# Patient Record
Sex: Female | Born: 1935
Health system: Southern US, Community
[De-identification: ages and names within clinical notes are randomized; demographics above are authoritative.]

## PROBLEM LIST (undated history)

## (undated) DIAGNOSIS — M8580 Other specified disorders of bone density and structure, unspecified site: Secondary | ICD-10-CM

## (undated) DIAGNOSIS — N390 Urinary tract infection, site not specified: Secondary | ICD-10-CM

## (undated) DIAGNOSIS — Z992 Dependence on renal dialysis: Secondary | ICD-10-CM

## (undated) DIAGNOSIS — I739 Peripheral vascular disease, unspecified: Secondary | ICD-10-CM

## (undated) DIAGNOSIS — N186 End stage renal disease: Secondary | ICD-10-CM

## (undated) DIAGNOSIS — I272 Pulmonary hypertension, unspecified: Secondary | ICD-10-CM

## (undated) DIAGNOSIS — K5900 Constipation, unspecified: Secondary | ICD-10-CM

## (undated) DIAGNOSIS — Z860100 Personal history of colon polyps, unspecified: Secondary | ICD-10-CM

## (undated) DIAGNOSIS — G43909 Migraine, unspecified, not intractable, without status migrainosus: Secondary | ICD-10-CM

## (undated) DIAGNOSIS — I2510 Atherosclerotic heart disease of native coronary artery without angina pectoris: Secondary | ICD-10-CM

## (undated) DIAGNOSIS — D649 Anemia, unspecified: Secondary | ICD-10-CM

## (undated) DIAGNOSIS — R197 Diarrhea, unspecified: Secondary | ICD-10-CM

## (undated) DIAGNOSIS — M1990 Unspecified osteoarthritis, unspecified site: Secondary | ICD-10-CM

## (undated) DIAGNOSIS — I10 Essential (primary) hypertension: Secondary | ICD-10-CM

## (undated) DIAGNOSIS — C801 Malignant (primary) neoplasm, unspecified: Secondary | ICD-10-CM

## (undated) DIAGNOSIS — E785 Hyperlipidemia, unspecified: Secondary | ICD-10-CM

## (undated) DIAGNOSIS — E039 Hypothyroidism, unspecified: Secondary | ICD-10-CM

## (undated) DIAGNOSIS — R34 Anuria and oliguria: Secondary | ICD-10-CM

## (undated) DIAGNOSIS — Z9289 Personal history of other medical treatment: Secondary | ICD-10-CM

## (undated) DIAGNOSIS — K219 Gastro-esophageal reflux disease without esophagitis: Secondary | ICD-10-CM

## (undated) DIAGNOSIS — I5032 Chronic diastolic (congestive) heart failure: Secondary | ICD-10-CM

## (undated) DIAGNOSIS — I350 Nonrheumatic aortic (valve) stenosis: Secondary | ICD-10-CM

## (undated) DIAGNOSIS — F419 Anxiety disorder, unspecified: Secondary | ICD-10-CM

## (undated) HISTORY — PX: APPENDECTOMY: SHX54

## (undated) HISTORY — DX: Chronic diastolic (congestive) heart failure: I50.32

## (undated) HISTORY — DX: End stage renal disease: N18.6

## (undated) HISTORY — PX: CORONARY ARTERY BYPASS GRAFT: SHX141

## (undated) HISTORY — PX: EYE SURGERY: SHX253

## (undated) HISTORY — PX: ABDOMINAL HYSTERECTOMY: SHX81

## (undated) HISTORY — DX: Essential (primary) hypertension: I10

## (undated) HISTORY — DX: Urinary tract infection, site not specified: N39.0

## (undated) HISTORY — DX: Atherosclerotic heart disease of native coronary artery without angina pectoris: I25.10

## (undated) HISTORY — DX: Personal history of colonic polyps: Z86.010

## (undated) HISTORY — DX: Hypothyroidism, unspecified: E03.9

## (undated) HISTORY — PX: CATARACT EXTRACTION: SUR2

## (undated) HISTORY — PX: OTHER SURGICAL HISTORY: SHX169

## (undated) HISTORY — DX: Gastro-esophageal reflux disease without esophagitis: K21.9

## (undated) HISTORY — PX: TONSILLECTOMY: SUR1361

## (undated) HISTORY — DX: Pulmonary hypertension, unspecified: I27.20

## (undated) HISTORY — DX: Other specified disorders of bone density and structure, unspecified site: M85.80

## (undated) HISTORY — DX: Personal history of colon polyps, unspecified: Z86.0100

## (undated) HISTORY — DX: Hyperlipidemia, unspecified: E78.5

## (undated) HISTORY — DX: Anemia, unspecified: D64.9

## (undated) HISTORY — DX: Nonrheumatic aortic (valve) stenosis: I35.0

## (undated) HISTORY — DX: Unspecified osteoarthritis, unspecified site: M19.90

## (undated) HISTORY — DX: Anxiety disorder, unspecified: F41.9

---

## 1998-01-05 HISTORY — PX: CARDIAC CATHETERIZATION: SHX172

## 1998-05-06 HISTORY — PX: REPLACEMENT TOTAL KNEE BILATERAL: SUR1225

## 2001-06-05 ENCOUNTER — Encounter: Payer: Self-pay | Admitting: Internal Medicine

## 2001-06-05 LAB — CONVERTED CEMR LAB: Pap Smear: NORMAL

## 2003-09-25 ENCOUNTER — Encounter: Payer: Self-pay | Admitting: Internal Medicine

## 2003-09-25 ENCOUNTER — Ambulatory Visit (HOSPITAL_COMMUNITY): Admission: RE | Admit: 2003-09-25 | Discharge: 2003-09-25 | Payer: Self-pay | Admitting: Cardiology

## 2003-10-10 ENCOUNTER — Ambulatory Visit: Payer: Self-pay | Admitting: Internal Medicine

## 2003-10-19 ENCOUNTER — Encounter: Admission: RE | Admit: 2003-10-19 | Discharge: 2003-10-19 | Payer: Self-pay | Admitting: Pediatrics

## 2003-11-06 ENCOUNTER — Ambulatory Visit: Payer: Self-pay | Admitting: Internal Medicine

## 2003-12-06 ENCOUNTER — Ambulatory Visit: Payer: Self-pay | Admitting: Internal Medicine

## 2003-12-21 ENCOUNTER — Ambulatory Visit: Payer: Self-pay | Admitting: Internal Medicine

## 2004-02-05 ENCOUNTER — Ambulatory Visit: Payer: Self-pay

## 2004-04-08 ENCOUNTER — Ambulatory Visit: Payer: Self-pay | Admitting: Internal Medicine

## 2004-04-15 ENCOUNTER — Ambulatory Visit: Payer: Self-pay | Admitting: Internal Medicine

## 2004-08-11 ENCOUNTER — Ambulatory Visit: Payer: Self-pay | Admitting: Internal Medicine

## 2004-08-12 ENCOUNTER — Ambulatory Visit: Payer: Self-pay

## 2004-11-19 ENCOUNTER — Ambulatory Visit: Payer: Self-pay | Admitting: Cardiology

## 2004-12-04 ENCOUNTER — Encounter: Payer: Self-pay | Admitting: Cardiology

## 2004-12-04 ENCOUNTER — Ambulatory Visit: Payer: Self-pay

## 2004-12-11 ENCOUNTER — Ambulatory Visit: Payer: Self-pay | Admitting: Internal Medicine

## 2005-01-05 HISTORY — PX: OTHER SURGICAL HISTORY: SHX169

## 2005-04-29 ENCOUNTER — Ambulatory Visit: Payer: Self-pay | Admitting: Internal Medicine

## 2005-06-29 ENCOUNTER — Ambulatory Visit: Payer: Self-pay | Admitting: Internal Medicine

## 2005-07-01 ENCOUNTER — Ambulatory Visit: Payer: Self-pay | Admitting: Internal Medicine

## 2005-07-27 ENCOUNTER — Ambulatory Visit: Payer: Self-pay

## 2005-08-05 ENCOUNTER — Ambulatory Visit: Payer: Self-pay | Admitting: Cardiology

## 2005-08-11 ENCOUNTER — Ambulatory Visit: Payer: Self-pay

## 2005-10-30 ENCOUNTER — Ambulatory Visit: Payer: Self-pay | Admitting: Internal Medicine

## 2006-01-13 ENCOUNTER — Ambulatory Visit: Payer: Self-pay | Admitting: Internal Medicine

## 2006-01-14 ENCOUNTER — Encounter: Payer: Self-pay | Admitting: Internal Medicine

## 2006-01-14 LAB — CONVERTED CEMR LAB: Glucose, Bld: 137 mg/dL — ABNORMAL HIGH (ref 70–99)

## 2006-02-01 ENCOUNTER — Ambulatory Visit: Payer: Self-pay | Admitting: Internal Medicine

## 2006-02-04 ENCOUNTER — Ambulatory Visit: Payer: Self-pay

## 2006-04-08 ENCOUNTER — Ambulatory Visit: Payer: Self-pay | Admitting: Internal Medicine

## 2006-04-08 LAB — CONVERTED CEMR LAB
ALT: 19 units/L (ref 0–40)
Cholesterol: 164 mg/dL (ref 0–200)
HDL: 58.7 mg/dL (ref >39.0)
LDL Cholesterol: 71 mg/dL (ref 0–99)
Total CHOL/HDL Ratio: 2.8
Triglycerides: 174 mg/dL — ABNORMAL HIGH (ref 0–149)
VLDL: 35 mg/dL (ref 0–40)

## 2006-05-27 ENCOUNTER — Encounter: Payer: Self-pay | Admitting: Internal Medicine

## 2006-05-27 DIAGNOSIS — M899 Disorder of bone, unspecified: Secondary | ICD-10-CM | POA: Insufficient documentation

## 2006-05-27 DIAGNOSIS — K219 Gastro-esophageal reflux disease without esophagitis: Secondary | ICD-10-CM

## 2006-05-27 DIAGNOSIS — R32 Unspecified urinary incontinence: Secondary | ICD-10-CM

## 2006-05-27 DIAGNOSIS — I10 Essential (primary) hypertension: Secondary | ICD-10-CM | POA: Insufficient documentation

## 2006-05-27 DIAGNOSIS — M1990 Unspecified osteoarthritis, unspecified site: Secondary | ICD-10-CM | POA: Insufficient documentation

## 2006-05-27 DIAGNOSIS — M949 Disorder of cartilage, unspecified: Secondary | ICD-10-CM

## 2006-05-27 DIAGNOSIS — E1129 Type 2 diabetes mellitus with other diabetic kidney complication: Secondary | ICD-10-CM

## 2006-05-27 DIAGNOSIS — E785 Hyperlipidemia, unspecified: Secondary | ICD-10-CM

## 2006-05-27 DIAGNOSIS — E039 Hypothyroidism, unspecified: Secondary | ICD-10-CM

## 2006-05-30 DIAGNOSIS — F411 Generalized anxiety disorder: Secondary | ICD-10-CM | POA: Insufficient documentation

## 2006-05-30 DIAGNOSIS — I272 Pulmonary hypertension, unspecified: Secondary | ICD-10-CM | POA: Insufficient documentation

## 2006-06-02 ENCOUNTER — Ambulatory Visit: Payer: Self-pay | Admitting: Internal Medicine

## 2006-06-04 LAB — CONVERTED CEMR LAB
ALT: 20 units/L (ref 0–40)
Albumin: 3.3 g/dL — ABNORMAL LOW (ref 3.5–5.2)
BUN: 41 mg/dL — ABNORMAL HIGH (ref 6–23)
CO2: 27 meq/L (ref 19–32)
Calcium: 9.4 mg/dL (ref 8.4–10.5)
Chloride: 108 meq/L (ref 96–112)
Cholesterol: 165 mg/dL (ref 0–200)
Creatinine, Ser: 1.6 mg/dL — ABNORMAL HIGH (ref 0.4–1.2)
GFR calc Af Amer: 39 mL/min
GFR calc non Af Amer: 33 mL/min
Glucose, Bld: 175 mg/dL — ABNORMAL HIGH (ref 70–99)
HDL: 49.5 mg/dL (ref >39.0)
Hgb A1c MFr Bld: 8.7 % — ABNORMAL HIGH (ref 4.6–6.0)
LDL Cholesterol: 92 mg/dL (ref 0–99)
Phosphorus: 3.4 mg/dL (ref 2.3–4.6)
Potassium: 4.7 meq/L (ref 3.5–5.1)
Sodium: 141 meq/L (ref 135–145)
Total CHOL/HDL Ratio: 3.3
Triglycerides: 120 mg/dL (ref 0–149)
VLDL: 24 mg/dL (ref 0–40)

## 2006-06-29 ENCOUNTER — Ambulatory Visit: Payer: Self-pay

## 2006-07-30 ENCOUNTER — Ambulatory Visit: Payer: Self-pay | Admitting: Cardiology

## 2006-08-10 ENCOUNTER — Ambulatory Visit: Payer: Self-pay | Admitting: Cardiology

## 2006-08-10 ENCOUNTER — Encounter: Payer: Self-pay | Admitting: Cardiology

## 2006-08-25 ENCOUNTER — Ambulatory Visit: Payer: Self-pay | Admitting: Cardiology

## 2006-09-26 ENCOUNTER — Encounter: Payer: Self-pay | Admitting: Internal Medicine

## 2006-10-04 ENCOUNTER — Ambulatory Visit: Payer: Self-pay | Admitting: Internal Medicine

## 2006-10-04 DIAGNOSIS — N259 Disorder resulting from impaired renal tubular function, unspecified: Secondary | ICD-10-CM | POA: Insufficient documentation

## 2006-10-04 DIAGNOSIS — I359 Nonrheumatic aortic valve disorder, unspecified: Secondary | ICD-10-CM | POA: Insufficient documentation

## 2006-10-04 LAB — CONVERTED CEMR LAB
Albumin: 3.5 g/dL (ref 3.5–5.2)
BUN: 41 mg/dL — ABNORMAL HIGH (ref 6–23)
CO2: 27 meq/L (ref 19–32)
Calcium: 9.5 mg/dL (ref 8.4–10.5)
Chloride: 106 meq/L (ref 96–112)
Creatinine, Ser: 1.5 mg/dL — ABNORMAL HIGH (ref 0.4–1.2)
GFR calc Af Amer: 43 mL/min
GFR calc non Af Amer: 35 mL/min
Glucose, Bld: 123 mg/dL — ABNORMAL HIGH (ref 70–99)
Hgb A1c MFr Bld: 6.9 % — ABNORMAL HIGH (ref 4.6–6.0)
Phosphorus: 3.4 mg/dL (ref 2.3–4.6)
Potassium: 4.9 meq/L (ref 3.5–5.1)
Sodium: 142 meq/L (ref 135–145)

## 2006-11-12 ENCOUNTER — Ambulatory Visit: Payer: Self-pay | Admitting: Internal Medicine

## 2006-11-15 ENCOUNTER — Encounter: Payer: Self-pay | Admitting: Internal Medicine

## 2006-11-24 ENCOUNTER — Ambulatory Visit: Payer: Self-pay | Admitting: Cardiology

## 2006-12-16 ENCOUNTER — Encounter: Payer: Self-pay | Admitting: Internal Medicine

## 2006-12-21 ENCOUNTER — Ambulatory Visit: Payer: Self-pay

## 2007-01-06 HISTORY — PX: AORTIC VALVE REPLACEMENT: SHX41

## 2007-01-06 HISTORY — PX: CORONARY ARTERY BYPASS GRAFT: SHX141

## 2007-01-07 ENCOUNTER — Ambulatory Visit: Payer: Self-pay | Admitting: Cardiology

## 2007-01-07 LAB — CONVERTED CEMR LAB
ALT: 14 units/L (ref 0–35)
AST: 22 units/L (ref 0–37)
Albumin: 4.1 g/dL (ref 3.5–5.2)
Alkaline Phosphatase: 79 units/L (ref 39–117)
BUN: 30 mg/dL — ABNORMAL HIGH (ref 6–23)
Basophils Absolute: 0 10*3/uL (ref 0.0–0.1)
Basophils Relative: 0 % (ref 0–1)
CO2: 24 meq/L (ref 19–32)
Calcium: 9.8 mg/dL (ref 8.4–10.5)
Chloride: 103 meq/L (ref 96–112)
Creatinine, Ser: 1.38 mg/dL — ABNORMAL HIGH (ref 0.40–1.20)
Eosinophils Absolute: 0.2 10*3/uL (ref 0.0–0.7)
Eosinophils Relative: 3 % (ref 0–5)
Glucose, Bld: 118 mg/dL — ABNORMAL HIGH (ref 70–99)
HCT: 35.9 % — ABNORMAL LOW (ref 36.0–46.0)
Hemoglobin: 12 g/dL (ref 12.0–15.0)
INR: 1 (ref 0.0–1.5)
Lymphocytes Relative: 17 % (ref 12–46)
Lymphs Abs: 1.2 10*3/uL (ref 0.7–4.0)
MCHC: 33.4 g/dL (ref 30.0–36.0)
MCV: 98.4 fL (ref 78.0–100.0)
Monocytes Absolute: 0.7 10*3/uL (ref 0.1–1.0)
Monocytes Relative: 9 % (ref 3–12)
Neutro Abs: 5.1 10*3/uL (ref 1.7–7.7)
Neutrophils Relative %: 71 % (ref 43–77)
Platelets: 250 10*3/uL (ref 150–400)
Potassium: 5 meq/L (ref 3.5–5.3)
Prothrombin Time: 13 s (ref 11.6–15.2)
RBC: 3.65 M/uL — ABNORMAL LOW (ref 3.87–5.11)
RDW: 13 % (ref 11.5–15.5)
Sodium: 141 meq/L (ref 135–145)
Total Bilirubin: 1.2 mg/dL (ref 0.3–1.2)
Total Protein: 7.6 g/dL (ref 6.0–8.3)
WBC: 7.2 10*3/uL (ref 4.0–10.5)
aPTT: 30 s (ref 24–37)

## 2007-01-12 ENCOUNTER — Ambulatory Visit: Payer: Self-pay | Admitting: Internal Medicine

## 2007-01-12 ENCOUNTER — Inpatient Hospital Stay (HOSPITAL_BASED_OUTPATIENT_CLINIC_OR_DEPARTMENT_OTHER): Admission: RE | Admit: 2007-01-12 | Discharge: 2007-01-12 | Payer: Self-pay | Admitting: Internal Medicine

## 2007-01-17 ENCOUNTER — Ambulatory Visit: Payer: Self-pay | Admitting: Thoracic Surgery (Cardiothoracic Vascular Surgery)

## 2007-01-17 ENCOUNTER — Encounter
Admission: RE | Admit: 2007-01-17 | Discharge: 2007-01-17 | Payer: Self-pay | Admitting: Thoracic Surgery (Cardiothoracic Vascular Surgery)

## 2007-01-24 ENCOUNTER — Encounter: Payer: Self-pay | Admitting: Thoracic Surgery (Cardiothoracic Vascular Surgery)

## 2007-01-24 ENCOUNTER — Ambulatory Visit (HOSPITAL_COMMUNITY)
Admission: RE | Admit: 2007-01-24 | Discharge: 2007-01-24 | Payer: Self-pay | Admitting: Thoracic Surgery (Cardiothoracic Vascular Surgery)

## 2007-01-25 ENCOUNTER — Encounter: Payer: Self-pay | Admitting: Internal Medicine

## 2007-01-26 ENCOUNTER — Inpatient Hospital Stay (HOSPITAL_COMMUNITY)
Admission: RE | Admit: 2007-01-26 | Discharge: 2007-02-02 | Payer: Self-pay | Admitting: Thoracic Surgery (Cardiothoracic Vascular Surgery)

## 2007-01-26 ENCOUNTER — Encounter: Payer: Self-pay | Admitting: Thoracic Surgery (Cardiothoracic Vascular Surgery)

## 2007-01-26 ENCOUNTER — Ambulatory Visit: Payer: Self-pay | Admitting: Thoracic Surgery (Cardiothoracic Vascular Surgery)

## 2007-02-02 ENCOUNTER — Encounter: Payer: Self-pay | Admitting: Internal Medicine

## 2007-02-04 ENCOUNTER — Emergency Department (HOSPITAL_COMMUNITY): Admission: EM | Admit: 2007-02-04 | Discharge: 2007-02-04 | Payer: Self-pay | Admitting: Emergency Medicine

## 2007-02-06 ENCOUNTER — Encounter: Payer: Self-pay | Admitting: Internal Medicine

## 2007-02-07 ENCOUNTER — Ambulatory Visit: Payer: Self-pay | Admitting: Thoracic Surgery (Cardiothoracic Vascular Surgery)

## 2007-02-07 ENCOUNTER — Ambulatory Visit: Payer: Self-pay | Admitting: Cardiology

## 2007-02-11 ENCOUNTER — Ambulatory Visit: Payer: Self-pay | Admitting: Internal Medicine

## 2007-02-11 DIAGNOSIS — K1120 Sialoadenitis, unspecified: Secondary | ICD-10-CM | POA: Insufficient documentation

## 2007-02-11 DIAGNOSIS — I2510 Atherosclerotic heart disease of native coronary artery without angina pectoris: Secondary | ICD-10-CM

## 2007-02-14 ENCOUNTER — Telehealth: Payer: Self-pay | Admitting: Internal Medicine

## 2007-02-21 ENCOUNTER — Ambulatory Visit: Payer: Self-pay | Admitting: Thoracic Surgery (Cardiothoracic Vascular Surgery)

## 2007-02-21 ENCOUNTER — Encounter
Admission: RE | Admit: 2007-02-21 | Discharge: 2007-02-21 | Payer: Self-pay | Admitting: Thoracic Surgery (Cardiothoracic Vascular Surgery)

## 2007-02-24 ENCOUNTER — Ambulatory Visit: Payer: Self-pay | Admitting: Cardiology

## 2007-03-01 ENCOUNTER — Ambulatory Visit: Payer: Self-pay | Admitting: Internal Medicine

## 2007-03-01 DIAGNOSIS — L03119 Cellulitis of unspecified part of limb: Secondary | ICD-10-CM

## 2007-03-01 DIAGNOSIS — L02419 Cutaneous abscess of limb, unspecified: Secondary | ICD-10-CM | POA: Insufficient documentation

## 2007-03-21 ENCOUNTER — Encounter: Payer: Self-pay | Admitting: Internal Medicine

## 2007-03-23 ENCOUNTER — Encounter: Payer: Self-pay | Admitting: Cardiology

## 2007-03-28 ENCOUNTER — Encounter: Payer: Self-pay | Admitting: Internal Medicine

## 2007-03-29 ENCOUNTER — Ambulatory Visit: Payer: Self-pay | Admitting: Cardiology

## 2007-04-05 ENCOUNTER — Telehealth: Payer: Self-pay | Admitting: Internal Medicine

## 2007-04-06 ENCOUNTER — Encounter: Payer: Self-pay | Admitting: Cardiology

## 2007-04-18 ENCOUNTER — Ambulatory Visit: Payer: Self-pay | Admitting: Cardiology

## 2007-05-05 ENCOUNTER — Ambulatory Visit: Payer: Self-pay | Admitting: Internal Medicine

## 2007-05-06 ENCOUNTER — Encounter: Payer: Self-pay | Admitting: Cardiology

## 2007-05-06 LAB — CONVERTED CEMR LAB
ALT: 18 units/L (ref 0–35)
AST: 20 units/L (ref 0–37)
Albumin: 3.5 g/dL (ref 3.5–5.2)
Alkaline Phosphatase: 87 units/L (ref 39–117)
BUN: 32 mg/dL — ABNORMAL HIGH (ref 6–23)
Basophils Absolute: 0.1 10*3/uL (ref 0.0–0.1)
Basophils Relative: 0.9 % (ref 0.0–1.0)
Bilirubin, Direct: 0.1 mg/dL (ref 0.0–0.3)
CO2: 30 meq/L (ref 19–32)
Calcium: 9.2 mg/dL (ref 8.4–10.5)
Chloride: 102 meq/L (ref 96–112)
Cholesterol: 166 mg/dL (ref 0–200)
Creatinine, Ser: 1.3 mg/dL — ABNORMAL HIGH (ref 0.4–1.2)
Eosinophils Absolute: 0.3 10*3/uL (ref 0.0–0.7)
Eosinophils Relative: 4.2 % (ref 0.0–5.0)
GFR calc Af Amer: 50 mL/min
GFR calc non Af Amer: 41 mL/min
Glucose, Bld: 133 mg/dL — ABNORMAL HIGH (ref 70–99)
HCT: 34.4 % — ABNORMAL LOW (ref 36.0–46.0)
HDL: 55.8 mg/dL (ref >39.0)
Hemoglobin: 11.7 g/dL — ABNORMAL LOW (ref 12.0–15.0)
LDL Cholesterol: 77 mg/dL (ref 0–99)
Lymphocytes Relative: 20.9 % (ref 12.0–46.0)
MCHC: 34 g/dL (ref 30.0–36.0)
MCV: 95.6 fL (ref 78.0–100.0)
Monocytes Absolute: 0.8 10*3/uL (ref 0.1–1.0)
Monocytes Relative: 10.4 % (ref 3.0–12.0)
Neutro Abs: 4.9 10*3/uL (ref 1.4–7.7)
Neutrophils Relative %: 63.6 % (ref 43.0–77.0)
Phosphorus: 3.9 mg/dL (ref 2.3–4.6)
Platelets: 184 10*3/uL (ref 150–400)
Potassium: 4.7 meq/L (ref 3.5–5.1)
RBC: 3.6 M/uL — ABNORMAL LOW (ref 3.87–5.11)
RDW: 12.2 % (ref 11.5–14.6)
Sodium: 135 meq/L (ref 135–145)
TSH: 0.1 microintl units/mL — ABNORMAL LOW (ref 0.35–5.50)
Total Bilirubin: 1.2 mg/dL (ref 0.3–1.2)
Total CHOL/HDL Ratio: 3
Total Protein: 7.2 g/dL (ref 6.0–8.3)
Triglycerides: 164 mg/dL — ABNORMAL HIGH (ref 0–149)
VLDL: 33 mg/dL (ref 0–40)
WBC: 7.7 10*3/uL (ref 4.5–10.5)

## 2007-05-20 ENCOUNTER — Ambulatory Visit: Payer: Self-pay | Admitting: Internal Medicine

## 2007-05-23 ENCOUNTER — Encounter: Payer: Self-pay | Admitting: Internal Medicine

## 2007-05-23 LAB — CONVERTED CEMR LAB
Free T4: 1 ng/dL (ref 0.6–1.6)
TSH: 0.25 microintl units/mL — ABNORMAL LOW (ref 0.35–5.50)

## 2007-06-06 ENCOUNTER — Telehealth: Payer: Self-pay | Admitting: Internal Medicine

## 2007-06-06 ENCOUNTER — Encounter: Payer: Self-pay | Admitting: Cardiology

## 2007-06-07 ENCOUNTER — Telehealth (INDEPENDENT_AMBULATORY_CARE_PROVIDER_SITE_OTHER): Payer: Self-pay | Admitting: *Deleted

## 2007-06-08 ENCOUNTER — Ambulatory Visit: Payer: Self-pay

## 2007-06-24 ENCOUNTER — Encounter: Payer: Self-pay | Admitting: Family Medicine

## 2007-07-04 ENCOUNTER — Encounter: Payer: Self-pay | Admitting: Internal Medicine

## 2007-08-15 ENCOUNTER — Ambulatory Visit: Payer: Self-pay | Admitting: Cardiology

## 2007-08-30 ENCOUNTER — Telehealth: Payer: Self-pay | Admitting: Internal Medicine

## 2007-11-08 ENCOUNTER — Ambulatory Visit: Payer: Self-pay | Admitting: Internal Medicine

## 2007-11-09 LAB — CONVERTED CEMR LAB
ALT: 18 units/L (ref 0–35)
AST: 22 units/L (ref 0–37)
Albumin: 3.3 g/dL — ABNORMAL LOW (ref 3.5–5.2)
Alkaline Phosphatase: 75 units/L (ref 39–117)
BUN: 35 mg/dL — ABNORMAL HIGH (ref 6–23)
Basophils Absolute: 0 10*3/uL (ref 0.0–0.1)
Basophils Relative: 0.8 % (ref 0.0–3.0)
Bilirubin, Direct: 0.1 mg/dL (ref 0.0–0.3)
CO2: 27 meq/L (ref 19–32)
Calcium: 9.2 mg/dL (ref 8.4–10.5)
Chloride: 103 meq/L (ref 96–112)
Cholesterol: 158 mg/dL (ref 0–200)
Creatinine, Ser: 1.3 mg/dL — ABNORMAL HIGH (ref 0.4–1.2)
Eosinophils Absolute: 0.2 10*3/uL (ref 0.0–0.7)
Eosinophils Relative: 3.9 % (ref 0.0–5.0)
GFR calc Af Amer: 50 mL/min
GFR calc non Af Amer: 41 mL/min
Glucose, Bld: 128 mg/dL — ABNORMAL HIGH (ref 70–99)
HCT: 30.9 % — ABNORMAL LOW (ref 36.0–46.0)
HDL: 58.5 mg/dL (ref >39.0)
Hemoglobin: 10.8 g/dL — ABNORMAL LOW (ref 12.0–15.0)
Hgb A1c MFr Bld: 6.8 % — ABNORMAL HIGH (ref 4.6–6.0)
LDL Cholesterol: 82 mg/dL (ref 0–99)
Lymphocytes Relative: 20.3 % (ref 12.0–46.0)
MCHC: 34.9 g/dL (ref 30.0–36.0)
MCV: 95.4 fL (ref 78.0–100.0)
Monocytes Absolute: 0.6 10*3/uL (ref 0.1–1.0)
Monocytes Relative: 10.3 % (ref 3.0–12.0)
Neutro Abs: 3.8 10*3/uL (ref 1.4–7.7)
Neutrophils Relative %: 64.7 % (ref 43.0–77.0)
Phosphorus: 3.9 mg/dL (ref 2.3–4.6)
Platelets: 197 10*3/uL (ref 150–400)
Potassium: 5.1 meq/L (ref 3.5–5.1)
RBC: 3.23 M/uL — ABNORMAL LOW (ref 3.87–5.11)
RDW: 11.7 % (ref 11.5–14.6)
Sodium: 138 meq/L (ref 135–145)
Total Bilirubin: 1.1 mg/dL (ref 0.3–1.2)
Total CHOL/HDL Ratio: 2.7
Total Protein: 6.9 g/dL (ref 6.0–8.3)
Triglycerides: 89 mg/dL (ref 0–149)
VLDL: 18 mg/dL (ref 0–40)
WBC: 5.8 10*3/uL (ref 4.5–10.5)

## 2007-11-14 ENCOUNTER — Encounter: Payer: Self-pay | Admitting: Internal Medicine

## 2007-11-18 ENCOUNTER — Encounter: Payer: Self-pay | Admitting: Internal Medicine

## 2007-12-14 ENCOUNTER — Ambulatory Visit: Payer: Self-pay

## 2008-01-17 ENCOUNTER — Encounter: Payer: Self-pay | Admitting: Internal Medicine

## 2008-01-17 ENCOUNTER — Ambulatory Visit: Payer: Self-pay | Admitting: Cardiology

## 2008-01-17 LAB — CONVERTED CEMR LAB
Folate: >20 ng/mL
Vitamin B-12: 428 pg/mL (ref 211–911)

## 2008-02-21 ENCOUNTER — Telehealth: Payer: Self-pay | Admitting: Internal Medicine

## 2008-02-28 ENCOUNTER — Telehealth: Payer: Self-pay | Admitting: Internal Medicine

## 2008-03-04 ENCOUNTER — Emergency Department (HOSPITAL_COMMUNITY): Admission: EM | Admit: 2008-03-04 | Discharge: 2008-03-04 | Payer: Self-pay | Admitting: Emergency Medicine

## 2008-03-23 ENCOUNTER — Ambulatory Visit: Payer: Self-pay | Admitting: Internal Medicine

## 2008-03-23 DIAGNOSIS — R002 Palpitations: Secondary | ICD-10-CM

## 2008-03-26 LAB — CONVERTED CEMR LAB
Albumin: 3.7 g/dL (ref 3.5–5.2)
BUN: 36 mg/dL — ABNORMAL HIGH (ref 6–23)
CO2: 19 meq/L (ref 19–32)
Calcium: 9 mg/dL (ref 8.4–10.5)
Chloride: 104 meq/L (ref 96–112)
Creatinine, Ser: 1.74 mg/dL — ABNORMAL HIGH (ref 0.40–1.20)
Glucose, Bld: 106 mg/dL — ABNORMAL HIGH (ref 70–99)
Phosphorus: 3.4 mg/dL (ref 2.3–4.6)
Potassium: 4.4 meq/L (ref 3.5–5.3)
Sodium: 137 meq/L (ref 135–145)

## 2008-05-21 ENCOUNTER — Encounter: Payer: Self-pay | Admitting: Internal Medicine

## 2008-06-28 ENCOUNTER — Ambulatory Visit: Payer: Self-pay

## 2008-07-02 ENCOUNTER — Encounter: Payer: Self-pay | Admitting: Cardiology

## 2008-07-17 ENCOUNTER — Ambulatory Visit: Payer: Self-pay | Admitting: Cardiology

## 2008-07-17 ENCOUNTER — Encounter: Payer: Self-pay | Admitting: Cardiology

## 2008-07-23 ENCOUNTER — Ambulatory Visit: Payer: Self-pay | Admitting: Internal Medicine

## 2008-07-31 ENCOUNTER — Encounter: Payer: Self-pay | Admitting: Internal Medicine

## 2008-07-31 LAB — CONVERTED CEMR LAB
ALT: 21 units/L (ref 0–35)
AST: 25 units/L (ref 0–37)
Albumin: 3.4 g/dL — ABNORMAL LOW (ref 3.5–5.2)
Alkaline Phosphatase: 73 units/L (ref 39–117)
BUN: 36 mg/dL — ABNORMAL HIGH (ref 6–23)
Basophils Absolute: 0 10*3/uL (ref 0.0–0.1)
Basophils Relative: 0.1 % (ref 0.0–3.0)
Bilirubin, Direct: 0.2 mg/dL (ref 0.0–0.3)
CO2: 30 meq/L (ref 19–32)
Calcium: 9 mg/dL (ref 8.4–10.5)
Chloride: 105 meq/L (ref 96–112)
Cholesterol: 141 mg/dL (ref 0–200)
Creatinine, Ser: 1.4 mg/dL — ABNORMAL HIGH (ref 0.4–1.2)
Eosinophils Absolute: 0.2 10*3/uL (ref 0.0–0.7)
Eosinophils Relative: 3 % (ref 0.0–5.0)
Free T4: 0.9 ng/dL (ref 0.6–1.6)
Glucose, Bld: 138 mg/dL — ABNORMAL HIGH (ref 70–99)
HCT: 30.2 % — ABNORMAL LOW (ref 36.0–46.0)
HDL: 58.4 mg/dL (ref >39.00)
Hemoglobin: 10.5 g/dL — ABNORMAL LOW (ref 12.0–15.0)
Hgb A1c MFr Bld: 6.8 % — ABNORMAL HIGH (ref 4.6–6.5)
LDL Cholesterol: 63 mg/dL (ref 0–99)
Lymphocytes Relative: 16.5 % (ref 12.0–46.0)
Lymphs Abs: 1.2 10*3/uL (ref 0.7–4.0)
MCHC: 34.7 g/dL (ref 30.0–36.0)
MCV: 97.5 fL (ref 78.0–100.0)
Monocytes Absolute: 0.7 10*3/uL (ref 0.1–1.0)
Monocytes Relative: 9.8 % (ref 3.0–12.0)
Neutro Abs: 5.1 10*3/uL (ref 1.4–7.7)
Neutrophils Relative %: 70.6 % (ref 43.0–77.0)
Phosphorus: 3.4 mg/dL (ref 2.3–4.6)
Platelets: 152 10*3/uL (ref 150.0–400.0)
Potassium: 4.9 meq/L (ref 3.5–5.1)
RBC: 3.1 M/uL — ABNORMAL LOW (ref 3.87–5.11)
RDW: 11.2 % — ABNORMAL LOW (ref 11.5–14.6)
Sodium: 141 meq/L (ref 135–145)
TSH: 0.09 microintl units/mL — ABNORMAL LOW (ref 0.35–5.50)
Total Bilirubin: 1.3 mg/dL — ABNORMAL HIGH (ref 0.3–1.2)
Total CHOL/HDL Ratio: 2
Total Protein: 7 g/dL (ref 6.0–8.3)
Triglycerides: 98 mg/dL (ref 0.0–149.0)
VLDL: 19.6 mg/dL (ref 0.0–40.0)
WBC: 7.2 10*3/uL (ref 4.5–10.5)

## 2008-08-07 ENCOUNTER — Encounter: Payer: Self-pay | Admitting: Internal Medicine

## 2008-09-14 ENCOUNTER — Telehealth: Payer: Self-pay | Admitting: Cardiology

## 2008-09-21 ENCOUNTER — Encounter: Payer: Self-pay | Admitting: Cardiology

## 2008-11-23 ENCOUNTER — Encounter: Payer: Self-pay | Admitting: Internal Medicine

## 2008-12-10 ENCOUNTER — Telehealth: Payer: Self-pay | Admitting: Cardiology

## 2008-12-17 ENCOUNTER — Encounter: Payer: Self-pay | Admitting: Internal Medicine

## 2009-01-01 ENCOUNTER — Ambulatory Visit: Payer: Self-pay

## 2009-01-01 ENCOUNTER — Encounter: Payer: Self-pay | Admitting: Cardiology

## 2009-01-01 DIAGNOSIS — I739 Peripheral vascular disease, unspecified: Secondary | ICD-10-CM

## 2009-01-01 DIAGNOSIS — I779 Disorder of arteries and arterioles, unspecified: Secondary | ICD-10-CM | POA: Insufficient documentation

## 2009-01-05 HISTORY — PX: CAROTID ENDARTERECTOMY: SUR193

## 2009-01-09 ENCOUNTER — Ambulatory Visit: Payer: Self-pay | Admitting: Internal Medicine

## 2009-01-14 LAB — CONVERTED CEMR LAB
Albumin: 3.3 g/dL — ABNORMAL LOW (ref 3.5–5.2)
BUN: 26 mg/dL — ABNORMAL HIGH (ref 6–23)
CO2: 24 meq/L (ref 19–32)
Calcium: 9.3 mg/dL (ref 8.4–10.5)
Chloride: 106 meq/L (ref 96–112)
Creatinine, Ser: 1.4 mg/dL — ABNORMAL HIGH (ref 0.4–1.2)
GFR calc non Af Amer: 37.81 mL/min (ref >60)
Glucose, Bld: 289 mg/dL — ABNORMAL HIGH (ref 70–99)
Phosphorus: 2.9 mg/dL (ref 2.3–4.6)
Potassium: 4.7 meq/L (ref 3.5–5.1)
Sodium: 139 meq/L (ref 135–145)

## 2009-01-22 ENCOUNTER — Ambulatory Visit: Payer: Self-pay | Admitting: Cardiology

## 2009-01-28 ENCOUNTER — Ambulatory Visit: Payer: Self-pay | Admitting: Internal Medicine

## 2009-01-30 LAB — CONVERTED CEMR LAB
ALT: 15 units/L (ref 0–35)
AST: 20 units/L (ref 0–37)
Albumin: 3.4 g/dL — ABNORMAL LOW (ref 3.5–5.2)
Alkaline Phosphatase: 74 units/L (ref 39–117)
BUN: 30 mg/dL — ABNORMAL HIGH (ref 6–23)
Basophils Absolute: 0 10*3/uL (ref 0.0–0.1)
Basophils Relative: 0 % (ref 0.0–3.0)
Bilirubin, Direct: 0.1 mg/dL (ref 0.0–0.3)
CO2: 27 meq/L (ref 19–32)
Calcium: 9 mg/dL (ref 8.4–10.5)
Chloride: 102 meq/L (ref 96–112)
Cholesterol: 158 mg/dL (ref 0–200)
Creatinine, Ser: 1.4 mg/dL — ABNORMAL HIGH (ref 0.4–1.2)
Eosinophils Absolute: 0.2 10*3/uL (ref 0.0–0.7)
Eosinophils Relative: 3.2 % (ref 0.0–5.0)
Free T4: 0.9 ng/dL (ref 0.6–1.6)
GFR calc non Af Amer: 37.81 mL/min (ref >60)
Glucose, Bld: 181 mg/dL — ABNORMAL HIGH (ref 70–99)
HCT: 31.4 % — ABNORMAL LOW (ref 36.0–46.0)
HDL: 61.1 mg/dL (ref >39.00)
Hemoglobin: 10.3 g/dL — ABNORMAL LOW (ref 12.0–15.0)
Hgb A1c MFr Bld: 7 % — ABNORMAL HIGH (ref 4.6–6.5)
LDL Cholesterol: 71 mg/dL (ref 0–99)
Lymphocytes Relative: 12.9 % (ref 12.0–46.0)
Lymphs Abs: 1 10*3/uL (ref 0.7–4.0)
MCHC: 32.7 g/dL (ref 30.0–36.0)
MCV: 99.6 fL (ref 78.0–100.0)
Monocytes Absolute: 0.5 10*3/uL (ref 0.1–1.0)
Monocytes Relative: 6.7 % (ref 3.0–12.0)
Neutro Abs: 6.1 10*3/uL (ref 1.4–7.7)
Neutrophils Relative %: 77.2 % — ABNORMAL HIGH (ref 43.0–77.0)
Phosphorus: 3.3 mg/dL (ref 2.3–4.6)
Platelets: 180 10*3/uL (ref 150.0–400.0)
Potassium: 4.8 meq/L (ref 3.5–5.1)
RBC: 3.15 M/uL — ABNORMAL LOW (ref 3.87–5.11)
RDW: 10.9 % — ABNORMAL LOW (ref 11.5–14.6)
Sodium: 136 meq/L (ref 135–145)
TSH: 0.24 microintl units/mL — ABNORMAL LOW (ref 0.35–5.50)
Total Bilirubin: 0.9 mg/dL (ref 0.3–1.2)
Total CHOL/HDL Ratio: 3
Total Protein: 6.8 g/dL (ref 6.0–8.3)
Triglycerides: 131 mg/dL (ref 0.0–149.0)
VLDL: 26.2 mg/dL (ref 0.0–40.0)
WBC: 7.8 10*3/uL (ref 4.5–10.5)

## 2009-02-06 ENCOUNTER — Ambulatory Visit: Payer: Self-pay | Admitting: Vascular Surgery

## 2009-02-06 ENCOUNTER — Encounter: Payer: Self-pay | Admitting: Internal Medicine

## 2009-02-14 ENCOUNTER — Encounter: Payer: Self-pay | Admitting: Vascular Surgery

## 2009-02-14 ENCOUNTER — Inpatient Hospital Stay (HOSPITAL_COMMUNITY): Admission: RE | Admit: 2009-02-14 | Discharge: 2009-02-15 | Payer: Self-pay | Admitting: Vascular Surgery

## 2009-02-14 ENCOUNTER — Ambulatory Visit: Payer: Self-pay | Admitting: Vascular Surgery

## 2009-03-06 ENCOUNTER — Encounter: Payer: Self-pay | Admitting: Internal Medicine

## 2009-03-06 ENCOUNTER — Ambulatory Visit: Payer: Self-pay | Admitting: Vascular Surgery

## 2009-03-28 ENCOUNTER — Ambulatory Visit: Payer: Self-pay | Admitting: Cardiology

## 2009-03-28 DIAGNOSIS — I959 Hypotension, unspecified: Secondary | ICD-10-CM | POA: Insufficient documentation

## 2009-04-08 ENCOUNTER — Ambulatory Visit: Payer: Self-pay | Admitting: Internal Medicine

## 2009-04-08 DIAGNOSIS — L280 Lichen simplex chronicus: Secondary | ICD-10-CM

## 2009-05-27 ENCOUNTER — Encounter: Payer: Self-pay | Admitting: Internal Medicine

## 2009-06-06 ENCOUNTER — Encounter: Payer: Self-pay | Admitting: Internal Medicine

## 2009-06-06 ENCOUNTER — Ambulatory Visit: Payer: Self-pay | Admitting: Ophthalmology

## 2009-06-18 ENCOUNTER — Ambulatory Visit: Payer: Self-pay | Admitting: Ophthalmology

## 2009-07-01 ENCOUNTER — Encounter: Payer: Self-pay | Admitting: Cardiology

## 2009-07-10 ENCOUNTER — Ambulatory Visit: Payer: Self-pay | Admitting: Cardiovascular Disease

## 2009-07-10 ENCOUNTER — Telehealth: Payer: Self-pay | Admitting: Cardiology

## 2009-07-11 ENCOUNTER — Telehealth: Payer: Self-pay | Admitting: Cardiovascular Disease

## 2009-07-12 ENCOUNTER — Telehealth: Payer: Self-pay | Admitting: Cardiology

## 2009-08-15 ENCOUNTER — Ambulatory Visit: Payer: Self-pay | Admitting: Cardiology

## 2009-08-19 ENCOUNTER — Telehealth: Payer: Self-pay | Admitting: Cardiology

## 2009-08-29 ENCOUNTER — Encounter: Payer: Self-pay | Admitting: Internal Medicine

## 2009-11-08 ENCOUNTER — Encounter: Payer: Self-pay | Admitting: Cardiology

## 2009-11-11 ENCOUNTER — Ambulatory Visit: Payer: Self-pay | Admitting: Vascular Surgery

## 2009-11-18 ENCOUNTER — Encounter: Payer: Self-pay | Admitting: Internal Medicine

## 2009-11-21 ENCOUNTER — Telehealth (INDEPENDENT_AMBULATORY_CARE_PROVIDER_SITE_OTHER): Payer: Self-pay | Admitting: *Deleted

## 2009-12-06 ENCOUNTER — Encounter: Payer: Self-pay | Admitting: Internal Medicine

## 2010-01-28 ENCOUNTER — Encounter: Payer: Self-pay | Admitting: Cardiology

## 2010-01-28 ENCOUNTER — Ambulatory Visit: Admission: RE | Admit: 2010-01-28 | Discharge: 2010-01-28 | Payer: Self-pay | Source: Home / Self Care

## 2010-02-02 LAB — CONVERTED CEMR LAB
ALT: 17 units/L (ref 0–40)
Albumin: 3.3 g/dL — ABNORMAL LOW (ref 3.5–5.2)
BUN: 33 mg/dL — ABNORMAL HIGH (ref 6–23)
CO2: 28 meq/L (ref 19–32)
Calcium: 9.3 mg/dL (ref 8.4–10.5)
Chloride: 107 meq/L (ref 96–112)
Chol/HDL Ratio, serum: 2.6
Cholesterol: 167 mg/dL (ref 0–200)
Creatinine, Ser: 1.4 mg/dL — ABNORMAL HIGH (ref 0.4–1.2)
Free T4: 0.9 ng/dL (ref 0.6–1.6)
GFR calc non Af Amer: 38 mL/min
Glomerular Filtration Rate, Af Am: 46 mL/min/{1.73_m2}
Glucose, Bld: 144 mg/dL — ABNORMAL HIGH (ref 70–99)
HDL: 64.6 mg/dL (ref >39.0)
LDL DIRECT: 76.8 mg/dL
Phosphorus Concentration: 3.6 mg/dL (ref 2.3–4.6)
Potassium: 5 meq/L (ref 3.5–5.1)
Sodium: 141 meq/L (ref 135–145)
TSH: 1.35 microintl units/mL (ref 0.35–5.50)
Triglyceride fasting, serum: 236 mg/dL (ref 0–149)
VLDL: 47 mg/dL — ABNORMAL HIGH (ref 0–40)

## 2010-02-04 NOTE — Assessment & Plan Note (Signed)
Summary: NURSE VISIT  Nurse Visit   Vital Signs:  Patient profile:   77 year old female Height:      62.25 inches Weight:      127 pounds BMI:     23.13 Pulse rate:   78 / minute BP sitting:   215 / 75  (left arm) Cuff size:   regular  Vitals Entered By: Bishop Dublin, CMA (July 10, 2009 4:22 PM)     Serial Vital Signs/Assessments:  Time      Position  BP       Pulse  Resp  Temp     By                     228/64   [address], CMA                     220/78   64                    Bishop Dublin, CMA   Allergies: 1)  Vasotec (Enalapril Maleate) 2)  Covera-Hs (Verapamil Hcl) 3)  Captopril (Captopril) 4)  * Dynacirc (Isradipine) 5)  Altace (Ramipril) 6)  Macrobid (Nitrofurantoin Monohyd Macro)  Patient Instructions: 1)  Your physician has recommended you make the following change in your medication: Hydralazine 50mg   2)  Your physician has requested that you regularly monitor and record your blood pressure readings at home.  Please use the same machine at the same time of day to check your readings and record them to bring to your follow-up visit.   Orders Added: 1)  EKG w/ Interpretation [93000] Prescriptions: HYDRALAZINE HCL 50 MG TABS (HYDRALAZINE HCL) Take one tablet by mouth three times a day for high blood pressure  #90 x 3   Entered by:   Benedict Needy, RN   Authorized by:   Dossie Arbour MD   Signed by:   Benedict Needy, RN on 07/10/2009   Method used:   Electronically to        [facility]  [address] (retail)       [address], [address]       [place], [place]  [address]       Ph: [phone]       Fax: [phone]   RxID:   [id number]   Appended Document: NURSE VISIT Evaluated Ms. Esham when she came in for her nursing visit. She was worried about her neck and the pulsation that she was seeing. Her SBP was >200, diastolic in the 70s. no symptoms of neck discomfort.  I suspect the  pulsation she is seeing in her neck is normal given she is s/p CEA on the right and the surgery had to extend lower than expected, by her report.  I expressed to her that I was concerned about her HTN. She has not been compliant with he clonidine. I explainedthat she would need to take the medication as directed as she may have rebound HTN. I have also given her a script for hydralazine 50 mg to be taken as needed for SBP >160. She reports having labile blood pressures that are sometimes well controlled. the hydralazine can be taken as needed for HTN.  She is to call us tomorrow after taking her PM and  bedtime  clonidine.

## 2010-02-04 NOTE — Assessment & Plan Note (Signed)
Summary: 6 month rov.sl  Medications Added HYDRALAZINE HCL 50 MG TABS (HYDRALAZINE HCL) Take one tablet by mouth three times a day for high blood pressure as needed        Visit Type:  6 mo f/u Primary Provider:  Cindee Salt MD  CC:  pt states her BP has been running high according to [facility].....says BP is high during day and eveing it drops .Marland Kitchen..denies any cardiac complaints today....  History of Present Illness: [name] comes in today for followup of her labile hypertension. She was in the [place] office one day when her systolic pressure increased to 200 while at cardiac rehabilitation. She was given hydralazine 50 mg p.o. p.r.n. for systolic pressure greater than 180. She has not had to take it.  She denies any chest pain, orthopnea, PND or edema. She still extremely active. She says her blood pressure is gone up and down all of her life.  Status post carotid and arterectomy on the right in February. He is asymptomatic.  Current Medications (verified): 1)  Metoprolol Tartrate 50 Mg Tabs (Metoprolol Tartrate) .Marland Kitchen.. 1 Two Times A Day 2)  Levothyroxine Sodium 75 Mcg Tabs (Levothyroxine Sodium) .... Take 1 Tablet By Mouth Once A Day..1/2 Tab On M, Thur 3)  Clonidine Hcl 0.2 Mg Tabs (Clonidine Hcl) .Marland Kitchen.. 1 Three Times A Day 4)  Hydrochlorothiazide 25 Mg Tabs (Hydrochlorothiazide) .Marland Kitchen.. 1 Once Daily 5)  Aspirin 81 Mg Tbec (Aspirin) .Marland Kitchen.. 1 Once Daily 6)  Ditropan 5 Mg Tabs (Oxybutynin Chloride) .Marland Kitchen.. 1 Once Daily 7)  Simvastatin 40 Mg  Tabs (Simvastatin) .... Take 1 Tablet By Mouth Once A Day 8)  Glipizide 5 Mg  Tb24 (Glipizide) .... Take 1 Tablet Two Times A Day 9)  Estradiol 0.5 Mg Tabs (Estradiol) .Marland Kitchen.. 1 By Mouth Once Daily 10)  Enalapril Maleate 10 Mg Tabs (Enalapril Maleate) .... Take One Tablet By Mouth Twice A Day 11)  Triamcinolone Acetonide 0.5 % Crea (Triamcinolone Acetonide) .... Apply Three Times A Day To Rash and Itchy Areas 12)  Hydralazine Hcl 50 Mg Tabs  (Hydralazine Hcl) .... Take One Tablet By Mouth Three Times A Day For High Blood Pressure As Needed  Allergies: 1)  Vasotec (Enalapril Maleate) 2)  Covera-Hs (Verapamil Hcl) 3)  Captopril (Captopril) 4)  * Dynacirc (Isradipine) 5)  Altace (Ramipril) 6)  Macrobid (Nitrofurantoin Monohyd Macro)  Past History:  Past Medical History: Last updated: 03/01/2007 Anemia-NOS Colonic polyps, hx of Diabetes mellitus, type II GERD Hyperlipidemia Hypertension-----------------------------------Dr Colindres-  [id number] Hypothyroidism Osteoarthritis Osteopenia Urinary incontinence Pulmonary hypertension Anxiety Renal insufficiency Coronary artery disease---------------------------Dr Parminder Cupples Aortic stenosis  Past Surgical History: Last updated: 04/08/2009 Hysterectomy Tonsillectomy carotidathersclerosis 40% cath  cad  2000 bilat. knee replacement  5/00 cataract  od adenosmme myoview  benign  EF-69%--8/07 carotids R  60%  l benign 09/2003 CABG/AVR--1/09 Right CEA--2/11  Family History: Last updated: 05/15/2008  Noncontributory.   Social History: Last updated: 02/11/2007 Retired --[employer] , [employer] rece[employer] Widowed 2007    1 son Former Smoker Alcohol use-no  Risk Factors: Smoking Status: quit (05/27/2006)  Review of Systems        negative other than the history of present illness  Vital Signs:  Patient profile:   77 year old female Height:      62.25 inches Weight:      127 pounds BMI:     23.13 Pulse rate:   60 / minute Pulse rhythm:   regular BP sitting:   162 / 100  (  left arm) Cuff size:   large  Vitals Entered By: Danielle Rankin, CMA (August 15, 2009 12:32 PM)  Physical Exam  General:  Well developed, well nourished, in no acute distress. Head:  normocephalic and atraumatic Eyes:  PERRLA/EOM intact; conjunctiva and lids normal. Neck:  Neck supple, no JVD. No masses, thyromegaly or abnormal cervical nodes. Chest Sumeya Yontz:  no deformities or breast masses  noted Lungs:  Clear bilaterally to auscultation and percussion. Heart:  PMI nondisplaced, regular rate and rhythm, soft systolic murmur, no diastolic component, no gallop, carotid upstrokes equal bilaterally soft bruits Abdomen:  Bowel sounds positive; abdomen soft and non-tender without masses, organomegaly, or hernias noted. No hepatosplenomegaly. Msk:  Back normal, normal gait. Muscle strength and tone normal. Pulses:  pulses normal in all 4 extremities Extremities:  No clubbing or cyanosis. Neurologic:  Alert and oriented x 3. Skin:  Intact without lesions or rashes. Psych:  Normal affect.   Impression & Recommendations:  Problem # 1:  HYPERTENSION (ICD-401.9) I have encouraged her to take hydralazine were pressures 180 or greater. I rechecked before she left the clinic it was 135/80. No changes in meds. Her updated medication list for this problem includes:    Metoprolol Tartrate 50 Mg Tabs (Metoprolol tartrate) .Marland Kitchen... 1 two times a day    Clonidine Hcl 0.2 Mg Tabs (Clonidine hcl) .Marland Kitchen... 1 three times a day    Hydrochlorothiazide 25 Mg Tabs (Hydrochlorothiazide) .Marland Kitchen... 1 once daily    Aspirin 81 Mg Tbec (Aspirin) .Marland Kitchen... 1 once daily    Enalapril Maleate 10 Mg Tabs (Enalapril maleate) .Marland Kitchen... Take one tablet by mouth twice a day    Hydralazine Hcl 50 Mg Tabs (Hydralazine hcl) .Marland Kitchen... Take one tablet by mouth three times a day for high blood pressure as needed  Problem # 2:  CAROTID ARTERY DISEASE (ICD-433.10) Assessment: Unchanged Followup carotid Dopplers and February 2012 Her updated medication list for this problem includes:    Aspirin 81 Mg Tbec (Aspirin) .Marland Kitchen... 1 once daily  Problem # 3:  CORONARY ARTERY DISEASE (ICD-414.00) Assessment: Unchanged  Her updated medication list for this problem includes:    Metoprolol Tartrate 50 Mg Tabs (Metoprolol tartrate) .Marland Kitchen... 1 two times a day    Aspirin 81 Mg Tbec (Aspirin) .Marland Kitchen... 1 once daily    Enalapril Maleate 10 Mg Tabs (Enalapril maleate)  .Marland Kitchen... Take one tablet by mouth twice a day  Problem # 4:  AORTIC STENOSIS (ICD-424.1) status post aortic valve replacement Her updated medication list for this problem includes:    Metoprolol Tartrate 50 Mg Tabs (Metoprolol tartrate) .Marland Kitchen... 1 two times a day    Hydrochlorothiazide 25 Mg Tabs (Hydrochlorothiazide) .Marland Kitchen... 1 once daily    Enalapril Maleate 10 Mg Tabs (Enalapril maleate) .Marland Kitchen... Take one tablet by mouth twice a day  Patient Instructions: 1)  Your physician recommends that you schedule a follow-up appointment in: 6 months with Dr. Daleen Squibb 2)  Your physician recommends that you continue on your current medications as directed. Please refer to the Current Medication list given to you today. 3)  Your physician has requested that you have a carotid duplex. This test is an ultrasound of the carotid arteries in your neck. It looks at blood flow through these arteries that supply the brain with blood. Allow one hour for this exam. There are no restrictions or special instructions.  In February 2012  Appended Document: 6 month rov.sl    Clinical Lists Changes  Orders: Added new Test order of Carotid Duplex (  Carotid Duplex) - Signed      Appended Document: 6 month rov.sl  Reviewed Juanito Doom, MD

## 2010-02-04 NOTE — Letter (Signed)
Summary: [facility]  [facility]   Imported By: Sherian Rein 09/12/2009 08:57:32  _____________________________________________________________________  External Attachment:    Type:   Image     Comment:   External Document

## 2010-02-04 NOTE — Assessment & Plan Note (Signed)
Summary: F2M/AMD  Medications Added LEVOTHYROXINE SODIUM 75 MCG TABS (LEVOTHYROXINE SODIUM) Take 1 tablet by mouth once a day..1/2 tab on M, Thur CLONIDINE HCL 0.2 MG TABS (CLONIDINE HCL) 1 three times a day        Visit Type:  2 mo f/u Primary Provider:  Cindee Salt MD  CC:  pt states she feels a little dizzy like she always does.Marland Kitchendenies any cp or sob.  History of Present Illness: Ms [name] returns today for evaluation and management of her multiple cardiac and vascular issues.  She status post a right carotid endarterectomy. She did remarkably well. Please defer to the discharge summary.  She's had no symptoms of TIAs or mini strokes. She has a little lightheadedness and actually leaned  to the right when she stood up this morning in the clinic.repair she denies any palpitations, chest tightness, shortness of breath, lower extremity edema. She denies any syncope.  Current Medications (verified): 1)  Metoprolol Tartrate 50 Mg Tabs (Metoprolol Tartrate) .Marland Kitchen.. 1 Two Times A Day 2)  Levothyroxine Sodium 75 Mcg Tabs (Levothyroxine Sodium) .... Take 1 Tablet By Mouth Once A Day..1/2 Tab On M, Thur 3)  Clonidine Hcl 0.3 Mg Tabs (Clonidine Hcl) .Marland Kitchen.. 1 Three Times A Day 4)  Hydrochlorothiazide 25 Mg Tabs (Hydrochlorothiazide) .Marland Kitchen.. 1 Once Daily 5)  Aspirin 81 Mg Tbec (Aspirin) .Marland Kitchen.. 1 Once Daily 6)  Ditropan 5 Mg Tabs (Oxybutynin Chloride) .Marland Kitchen.. 1 Once Daily 7)  Simvastatin 40 Mg  Tabs (Simvastatin) .... Take 1 Tablet By Mouth Once A Day 8)  Glipizide 5 Mg  Tb24 (Glipizide) .... Take 1 Tablet Two Times A Day 9)  Estradiol 0.5 Mg Tabs (Estradiol) .Marland Kitchen.. 1 By Mouth Once Daily 10)  Enalapril Maleate 10 Mg Tabs (Enalapril Maleate) .... Take One Tablet By Mouth Twice A Day  Allergies: 1)  Vasotec (Enalapril Maleate) 2)  Covera-Hs (Verapamil Hcl) 3)  Captopril (Captopril) 4)  * Dynacirc (Isradipine) 5)  Altace (Ramipril) 6)  Macrobid (Nitrofurantoin Monohyd Macro)  Past History:  Past  Medical History: Last updated: 03/01/2007 Anemia-NOS Colonic polyps, hx of Diabetes mellitus, type II GERD Hyperlipidemia Hypertension-----------------------------------Dr Colindres-  [phone] Hypothyroidism Osteoarthritis Osteopenia Urinary incontinence Pulmonary hypertension Anxiety Renal insufficiency Coronary artery disease---------------------------Dr Mayo Owczarzak Aortic stenosis  Past Surgical History: Last updated: 02/11/2007 Hysterectomy Tonsillectomy carotidathersclerosis 40% cath  cad  2000 bilat. knee replacement  5/00 cataract  od adenosmme myoview  benign  EF-69%--8/07 carotids R  60%  l benign 09/2003 CABG/AVR--1/09  Family History: Last updated: 05/15/2008  Noncontributory.   Social History: Last updated: 02/11/2007 Retired --[employer] , Accts receivable Widowed 2007    1 son Former Smoker Alcohol use-no  Risk Factors: Smoking Status: quit (05/27/2006)  Review of Systems       negative other than history of present illness  Vital Signs:  Patient profile:   77 year old female Height:      62.25 inches Weight:      124 pounds Pulse rate:   58 / minute Pulse rhythm:   regular BP sitting:   94 / 50  (left arm) Cuff size:   large  Vitals Entered By: Danielle Rankin, CMA (March 28, 2009 11:24 AM)  Physical Exam  General:  Well developed, well nourished, in no acute distress. Head:  normocephalic and atraumatic Eyes:  PERRLA/EOM intact; conjunctiva and lids normal. Neck:  Neck supple, no JVD. No masses, thyromegaly or abnormal cervical nodes. Chest Jenisha Faison:  no deformities or breast masses noted Lungs:  Clear  bilaterally to auscultation and percussion. Heart:  Non-displaced PMI, chest non-tender; regular rate and rhythm, S1, S2 without murmurs, rubs or gallops. Carotid upstroke normal, soft bilateral bruits.Normal abdominal aortic size, no bruits. Femorals normal pulses, no bruits. Pedals normal pulses. No edema, no varicosities. Msk:  Back normal,  normal gait. Muscle strength and tone normal. Pulses:  pulses normal in all 4 extremities Extremities:  No clubbing or cyanosis. Neurologic:  Alert and oriented x 3. Skin:  drive localized spots that are pruritic. It does not look like a drug rash. Psych:  Normal affect.   Impression & Recommendations:  Problem # 1:  UNSPECIFIED HYPOTENSION (ICD-458.9) Assessment New I have reviewed her meds. We'll decrease her clonidine to 0.2 mg 3 times a day. I would stop her HCTZ but she's had symptomatic edema.  Problem # 2:  CAROTID ARTERY DISEASE (ICD-433.10) Assessment: Improved  Her updated medication list for this problem includes:    Aspirin 81 Mg Tbec (Aspirin) .Marland Kitchen... 1 once daily  Her updated medication list for this problem includes:    Aspirin 81 Mg Tbec (Aspirin) .Marland Kitchen... 1 once daily  Problem # 3:  CORONARY ARTERY DISEASE (ICD-414.00) Assessment: Improved status post coronary bypass graft Her updated medication list for this problem includes:    Metoprolol Tartrate 50 Mg Tabs (Metoprolol tartrate) .Marland Kitchen... 1 two times a day    Aspirin 81 Mg Tbec (Aspirin) .Marland Kitchen... 1 once daily    Enalapril Maleate 10 Mg Tabs (Enalapril maleate) .Marland Kitchen... Take one tablet by mouth twice a day  Her updated medication list for this problem includes:    Metoprolol Tartrate 50 Mg Tabs (Metoprolol tartrate) .Marland Kitchen... 1 two times a day    Aspirin 81 Mg Tbec (Aspirin) .Marland Kitchen... 1 once daily    Enalapril Maleate 10 Mg Tabs (Enalapril maleate) .Marland Kitchen... Take one tablet by mouth twice a day  Problem # 4:  AORTIC STENOSIS (ICD-424.1) Assessment: Improved spost aortic valve replacement Her updated medication list for this problem includes:    Metoprolol Tartrate 50 Mg Tabs (Metoprolol tartrate) .Marland Kitchen... 1 two times a day    Hydrochlorothiazide 25 Mg Tabs (Hydrochlorothiazide) .Marland Kitchen... 1 once daily    Enalapril Maleate 10 Mg Tabs (Enalapril maleate) .Marland Kitchen... Take one tablet by mouth twice a day  Problem # 5:  HYPERTENSION  (ICD-401.9) Assessment: Improved  Her updated medication list for this problem includes:    Metoprolol Tartrate 50 Mg Tabs (Metoprolol tartrate) .Marland Kitchen... 1 two times a day    Clonidine Hcl 0.2 Mg Tabs (Clonidine hcl) .Marland Kitchen... 1 three times a day    Hydrochlorothiazide 25 Mg Tabs (Hydrochlorothiazide) .Marland Kitchen... 1 once daily    Aspirin 81 Mg Tbec (Aspirin) .Marland Kitchen... 1 once daily    Enalapril Maleate 10 Mg Tabs (Enalapril maleate) .Marland Kitchen... Take one tablet by mouth twice a day  Patient Instructions: 1)  Your physician recommends that you schedule a follow-up appointment in: 6 MONTHS WITH DR Annemarie Sebree 2)  Your physician has recommended you make the following change in your medication: DECREASE CLONIDINE TO 0.2 MG three times a day  3)  TAKE BENADRYL OVER THE COUNTER AS DIRECTED 4)  OVER THE COUNTER STEROID CREAM APPLY TO AFFECTED AREA AS DIRECTED Prescriptions: CLONIDINE HCL 0.2 MG TABS (CLONIDINE HCL) 1 three times a day  #90 x 6   Entered by:   Scherrie Bateman, LPN   Authorized by:   Gaylord Shih, MD, [facility]   Signed by:   Scherrie Bateman, LPN on 16/10/9602   Method used:  Electronically to        [facility]  [address] (retail)       [address], [address]       [place], [place]  [address]       Ph: [phone]       Fax: [phone]   RxID:   [id number]

## 2010-02-04 NOTE — Letter (Signed)
Summary: [facility]   Imported By: Lanelle Bal 06/18/2009 10:48:17  _____________________________________________________________________  External Attachment:    Type:   Image     Comment:   External Document  Appended Document: [facility] stable HTN and kidney disease  no changes

## 2010-02-04 NOTE — Letter (Signed)
Summary: [facility]  [facility]   Imported By: Maryln Gottron 12/02/2009 13:00:33  _____________________________________________________________________  External Attachment:    Type:   Image     Comment:   External Document  Appended Document: [facility] HTN okay creat up a little--needs following Probable asymptomatic bacteriuria

## 2010-02-04 NOTE — Letter (Signed)
Summary: [facility]   [facility]   Imported By: Roderic Ovens 11/20/2009 13:50:28  _____________________________________________________________________  External Attachment:    Type:   Image     Comment:   External Document

## 2010-02-04 NOTE — Progress Notes (Signed)
Summary: BP   Phone Note Call from Patient   Caller: Patient Call For: nurse Summary of Call: pt called with her BP this am it was 140/75.  Instructed her to call back tomorrow with BP. Initial call taken by: Benedict Needy, RN,  July 11, 2009 9:42 AM

## 2010-02-04 NOTE — Consult Note (Signed)
Summary: [facility]  [facility]   Imported By: Lanelle Bal 02/14/2009 09:59:35  _____________________________________________________________________  External Attachment:    Type:   Image     Comment:   External Document

## 2010-02-04 NOTE — Miscellaneous (Signed)
Summary: Orders Update  Clinical Lists Changes  Orders: Added new Test order of Carotid Duplex (Carotid Duplex) - Signed  Appended Document: Orders Update Cancel exam, patient is s/p CEA in 02/2009

## 2010-02-04 NOTE — Progress Notes (Signed)
Summary: talk to nurse   Phone Note Call from Patient   Caller: Patient Reason for Call: Talk to Nurse Summary of Call: pt had a syncopal episode this morning while working in the basement. Pt leaned over to pick something up. pt wants to know if she needs to call 911 or be seen.  Initial call taken by: Edman Circle,  August 19, 2009 12:20 PM  Follow-up for Phone Call        I spoke with [name] who felt "like I'm going backwards" this morning while sweeping her basement floor.  She says she felt like she was being pulled backwards and did fall but is alright. Her gait is not unsteady and there is no slurring of speech.  She had already eaten breakfast and taken her meds prior to sweeping the basement. She states that she also felt a little nausea which is better now.  Her b/p then was 124/90.  It is now 192/78. She did take her hydralazine this morning.  She was not wearing her lifeline.  She does not want to go to the hospital at this time.  However, she is worried about CVA.  I reviewed s/sx of CVA.  She will call 911 if this happens again.  Mylo Red RN     Appended Document: talk to nurse  Reviewed Juanito Doom, MD

## 2010-02-04 NOTE — Assessment & Plan Note (Signed)
Summary: F6M/AMD      Allergies Added:   Visit Type:  Follow-up Primary Provider:  Cindee Salt MD  CC:  no cp, no sob, and sometime edema.  History of Present Illness: [name] comes in today to discuss the findings of her most recent carotid Dopplers. Chest progression of her right internal carotid artery stenosis to 80-99%. When carefully questioned, she is asymptomatic.  She's also having no angina or symptoms of valvular heart disease. She is compliant with her medications and continues in cardiac rehabilitation.  Current Problems (verified): 1)  Carotid Artery Disease  (ICD-433.10) 2)  Palpitations  (ICD-785.1) 3)  Cellulitis, Left Leg  (ICD-682.6) 4)  Parotiditis  (ICD-527.2) 5)  Coronary Artery Disease  (ICD-414.00) 6)  Aortic Stenosis  (ICD-424.1) 7)  Renal Insufficiency  (ICD-588.9) 8)  Pulmonary Hypertension  (ICD-416.8) 9)  Anxiety  (ICD-300.00) 10)  Urinary Incontinence  (ICD-788.30) 11)  Osteopenia  (ICD-733.90) 12)  Osteoarthritis  (ICD-715.90) 13)  Hypothyroidism  (ICD-244.9) 14)  Hypertension  (ICD-401.9) 15)  Hyperlipidemia  (ICD-272.4) 16)  Gerd  (ICD-530.81) 17)  Diabetes Mellitus, Type II  (ICD-250.00) 18)  Colonic Polyps, Hx of  (ICD-V12.72) 19)  Anemia-nos  (ICD-285.9)  Current Medications (verified): 1)  Metoprolol Tartrate 50 Mg Tabs (Metoprolol Tartrate) .Marland Kitchen.. 1 Two Times A Day 2)  Levothyroxine Sodium 75 Mcg Tabs (Levothyroxine Sodium) .... Take 1 Tablet By Mouth Once A Day 3)  Clonidine Hcl 0.3 Mg Tabs (Clonidine Hcl) .Marland Kitchen.. 1 Three Times A Day 4)  Hydrochlorothiazide 25 Mg Tabs (Hydrochlorothiazide) .Marland Kitchen.. 1 Once Daily 5)  Aspirin 81 Mg Tbec (Aspirin) .Marland Kitchen.. 1 Once Daily 6)  Ditropan 5 Mg Tabs (Oxybutynin Chloride) .Marland Kitchen.. 1 Once Daily 7)  Simvastatin 40 Mg  Tabs (Simvastatin) .... Take 1 Tablet By Mouth Once A Day 8)  Bisacodyl Ec 5 Mg  Tbec (Bisacodyl) .... 2 Tabs As Needed 9)  Glipizide 5 Mg  Tb24 (Glipizide) .... Take 1 Tablet Two Times A  Day 10)  Estradiol 0.5 Mg Tabs (Estradiol) .Marland Kitchen.. 1 By Mouth Once Daily 11)  Enalapril Maleate 10 Mg Tabs (Enalapril Maleate) .... Take One Tablet By Mouth Twice A Day 12)  Amoxicillin 500 Mg Caps (Amoxicillin) .... Take 4 By Mouth 1 Hour Before Procedure.  Allergies (verified): 1)  Vasotec (Enalapril Maleate) 2)  Covera-Hs (Verapamil Hcl) 3)  Captopril (Captopril) 4)  * Dynacirc (Isradipine) 5)  Altace (Ramipril) 6)  Macrobid (Nitrofurantoin Monohyd Macro)  Past History:  Past Medical History: Last updated: 03/01/2007 Anemia-NOS Colonic polyps, hx of Diabetes mellitus, type II GERD Hyperlipidemia Hypertension-----------------------------------Dr Colindres-  [phone] Hypothyroidism Osteoarthritis Osteopenia Urinary incontinence Pulmonary hypertension Anxiety Renal insufficiency Coronary artery disease---------------------------Dr Heath Badon Aortic stenosis  Past Surgical History: Last updated: 02/11/2007 Hysterectomy Tonsillectomy carotidathersclerosis 40% cath  cad  2000 bilat. knee replacement  5/00 cataract  od adenosmme myoview  benign  EF-69%--8/07 carotids R  60%  l benign 09/2003 CABG/AVR--1/09  Family History: Last updated: 05/15/2008  Noncontributory.   Social History: Last updated: 02/11/2007 Retired --[employer] , Accts receivable Widowed 2007    1 son Former Smoker Alcohol use-no  Risk Factors: Smoking Status: quit (05/27/2006)  Review of Systems       negative other than history of present illness  Vital Signs:  Patient profile:   77 year old female Height:      62.25 inches Weight:      126 pounds Pulse rate:   71 / minute Pulse rhythm:   regular BP sitting:  120 / 68  (left arm) Cuff size:   large  Vitals Entered By: Mercer Pod (January 22, 2009 11:59 AM)  Physical Exam  General:  Well developed, well nourished, in no acute distress. Head:  normocephalic and atraumatic Eyes:  PERRLA/EOM intact; conjunctiva and lids  normal. Chest Sharmon Cheramie:  no deformities or breast masses noted Lungs:  Clear bilaterally to auscultation and percussion. Heart:  systolic murmur 2/6, S2 splits, no diastolic component, bilateral carotid bruits left greater than right Msk:  Back normal, normal gait. Muscle strength and tone normal. Pulses:  pulses normal in all 4 extremities Extremities:  No clubbing or cyanosis. Neurologic:  Alert and oriented x 3. Skin:  Intact without lesions or rashes. Psych:  Normal affect.   Impression & Recommendations:  Problem # 1:  CAROTID ARTERY DISEASE (ICD-433.10) Assessment Deteriorated I have reviewed the findings of her most recent carotid Dopplers in detail with her. I referred her to Dr. Darrick Penna of [facility] for consideration of right carotid endarterectomy. I have discussed indications, potential risks, and benefits of such a procedure. The present time, she has an appointment the first week of February and will continue with her current medications. She is asymptomatic. I did review symptoms of TIAs or mini strokes as well as an acute CVA. Her updated medication list for this problem includes:    Aspirin 81 Mg Tbec (Aspirin) .Marland Kitchen... 1 once daily  Problem # 2:  CORONARY ARTERY DISEASE (ICD-414.00) Assessment: Unchanged  Her updated medication list for this problem includes:    Metoprolol Tartrate 50 Mg Tabs (Metoprolol tartrate) .Marland Kitchen... 1 two times a day    Aspirin 81 Mg Tbec (Aspirin) .Marland Kitchen... 1 once daily    Enalapril Maleate 10 Mg Tabs (Enalapril maleate) .Marland Kitchen... Take one tablet by mouth twice a day  Problem # 3:  AORTIC STENOSIS (ICD-424.1) Assessment: Unchanged  Her updated medication list for this problem includes:    Metoprolol Tartrate 50 Mg Tabs (Metoprolol tartrate) .Marland Kitchen... 1 two times a day    Hydrochlorothiazide 25 Mg Tabs (Hydrochlorothiazide) .Marland Kitchen... 1 once daily    Enalapril Maleate 10 Mg Tabs (Enalapril maleate) .Marland Kitchen... Take one tablet by mouth twice a day  Problem # 4:  HYPERTENSION  (ICD-401.9) Assessment: Improved  Her updated medication list for this problem includes:    Metoprolol Tartrate 50 Mg Tabs (Metoprolol tartrate) .Marland Kitchen... 1 two times a day    Clonidine Hcl 0.3 Mg Tabs (Clonidine hcl) .Marland Kitchen... 1 three times a day    Hydrochlorothiazide 25 Mg Tabs (Hydrochlorothiazide) .Marland Kitchen... 1 once daily    Aspirin 81 Mg Tbec (Aspirin) .Marland Kitchen... 1 once daily    Enalapril Maleate 10 Mg Tabs (Enalapril maleate) .Marland Kitchen... Take one tablet by mouth twice a day  Problem # 5:  HYPERLIPIDEMIA (ICD-272.4) Assessment: Improved  Her updated medication list for this problem includes:    Simvastatin 40 Mg Tabs (Simvastatin) .Marland Kitchen... Take 1 tablet by mouth once a day  Patient Instructions: 1)  Your physician recommends that you schedule a follow-up appointment in: March of 2011 after her surgical visit and or procedure. No change in medications

## 2010-02-04 NOTE — Progress Notes (Signed)
Summary: bulge in r neck   Phone Note Call from Patient   Reason for Call: Talk to Nurse Summary of Call: pt at [facility]-she was noted to have a slight bulge in her right neck and a little hoarse-since yesterday-steven wanting to know what to do-pls call [phone] Initial call taken by: Glynda Jaeger,  July 10, 2009 2:53 PM  Follow-up for Phone Call        [facility] ON HOME NUMBER. PHONE NUMBER IN MESSAGE HAS BEEN DISCONNECTED. Follow-up by: Scherrie Bateman, LPN,  July 11, 4538 5:32 PM  Additional Follow-up for Phone Call Additional follow up Details #1::        SPOKE WITH PT HAS BEEN SEEN IN [place] OFFICE  BY DR Mariah Milling. PER PT FEELS BETTER . PT TO CONT TO MONITOR B/P AND CALL OFFICE WITH READINGS. Additional Follow-up by: Scherrie Bateman, LPN,  July 11, 9809 12:55 PM

## 2010-02-04 NOTE — Progress Notes (Signed)
Summary: BP   Phone Note Call from Patient Call back at Home Phone [phone]   Caller: Patient Call For: RN Summary of Call: Patient called to speak with Morrie Sheldon to discuss her BP numbers. Would like for Morrie Sheldon to call her back. Initial call taken by: West Carbo,  July 12, 2009 11:50 AM  Follow-up for Phone Call        pt called with BP Thursday: 12:30 158/87, 3:30 128/33, 7:00 160/72, 10:30 113/50. Friday: 6:30 143/64, 9:30 200/83 (took hydralazine), 11:30 112/54 pt has follow up with Dr. Daleen Squibb on Aug 11 Follow-up by: Benedict Needy, RN,  July 12, 2009 11:58 AM  Additional Follow-up for Phone Call Additional follow up Details #1::        better, no change in meds. Additional Follow-up by: Gaylord Shih, MD, [facility],  July 12, 2009 4:46 PM

## 2010-02-04 NOTE — Assessment & Plan Note (Signed)
Summary: 6 M FU DLO   Vital Signs:  Patient profile:   77 year old female Weight:      128 pounds Temp:     98 degrees F oral Pulse rate:   60 / minute Pulse rhythm:   regular BP sitting:   148 / 60  (left arm) Cuff size:   regular  Vitals Entered By: Mervin Hack CMA Duncan Dull) (January 28, 2009 10:49 AM) CC: 6 month follow-up   History of Present Illness: Doing okay Recent visit with Dr Daleen Squibb Increased carotid stenosis--planning elective CEA  Diabetes okay Occ under 100, 117 this AM checks several times per week  BP generally okay always high at dental clinic---better if she takes clonidine before per Dr Val Eagle  No chest pain Would note tightness in throat when getting the paper in the cold No sig SOB  No sig arthritis problems  no heartburn  Allergies: 1)  Vasotec (Enalapril Maleate) 2)  Covera-Hs (Verapamil Hcl) 3)  Captopril (Captopril) 4)  * Dynacirc (Isradipine) 5)  Altace (Ramipril) 6)  Macrobid (Nitrofurantoin Monohyd Macro)  Past History:  Past medical, surgical, family and social histories (including risk factors) reviewed for relevance to current acute and chronic problems.  Past Medical History: Reviewed history from 03/01/2007 and no changes required. Anemia-NOS Colonic polyps, hx of Diabetes mellitus, type II GERD Hyperlipidemia Hypertension-----------------------------------Dr Colindres-  [phone] Hypothyroidism Osteoarthritis Osteopenia Urinary incontinence Pulmonary hypertension Anxiety Renal insufficiency Coronary artery disease---------------------------Dr Wall Aortic stenosis  Past Surgical History: Reviewed history from 02/11/2007 and no changes required. Hysterectomy Tonsillectomy carotidathersclerosis 40% cath  cad  2000 bilat. knee replacement  5/00 cataract  od adenosmme myoview  benign  EF-69%--8/07 carotids R  60%  l benign 09/2003 CABG/AVR--1/09  Family History: Reviewed history from 05/15/2008 and no  changes required.  Noncontributory.   Social History: Reviewed history from 02/11/2007 and no changes required. Retired --[employer] , Accts receivable Widowed 2007    1 son Former Smoker Alcohol use-no  Review of Systems       appetite is fine weight fairly stable sleeps okay ---occ freq nocturia but not persistent generally her mood is okay due for cataract surgery but holding off on this now  Physical Exam  General:  alert and normal appearance.   Neck:  supple, no masses, and no thyromegaly.   Lungs:  normal respiratory effort and normal breath sounds.   Heart:  normal rate, regular rhythm, and no gallop.   Gr 3/6 systolic murmur across precordium but loudest at base Pulses:  1+ in feet Extremities:  no edema Psych:  normally interactive, good eye contact, not anxious appearing, and not depressed appearing.    Diabetes Management Exam:    Foot Exam (with socks and/or shoes not present):       Sensory-Pinprick/Light touch:          Left medial foot (L-4): diminished          Left dorsal foot (L-5): diminished          Left lateral foot (S-1): diminished          Right medial foot (L-4): diminished          Right dorsal foot (L-5): diminished          Right lateral foot (S-1): diminished       Inspection:          Left foot: normal          Right foot: normal   Impression & Recommendations:  Problem # 1:  DIABETES MELLITUS, TYPE II (ICD-250.00) Assessment Unchanged  has been well controlled will check labs  Her updated medication list for this problem includes:    Aspirin 81 Mg Tbec (Aspirin) .Marland Kitchen... 1 once daily    Glipizide 5 Mg Tb24 (Glipizide) .Marland Kitchen... Take 1 tablet two times a day    Enalapril Maleate 10 Mg Tabs (Enalapril maleate) .Marland Kitchen... Take one tablet by mouth twice a day  Labs Reviewed: Creat: 1.4 (01/09/2009)     Last Eye Exam: No retinopathy. Cataracts noted (06/05/2008) Reviewed HgBA1c results: 6.8 (07/23/2008)  6.8  (11/08/2007)  Orders: TLB-A1C / Hgb A1C (Glycohemoglobin) (83036-A1C)  Problem # 2:  HYPERTENSION (ICD-401.9) Assessment: Unchanged  reasonable control no changes  Her updated medication list for this problem includes:    Metoprolol Tartrate 50 Mg Tabs (Metoprolol tartrate) .Marland Kitchen... 1 two times a day    Clonidine Hcl 0.3 Mg Tabs (Clonidine hcl) .Marland Kitchen... 1 three times a day    Hydrochlorothiazide 25 Mg Tabs (Hydrochlorothiazide) .Marland Kitchen... 1 once daily    Enalapril Maleate 10 Mg Tabs (Enalapril maleate) .Marland Kitchen... Take one tablet by mouth twice a day  BP today: 148/60 Prior BP: 120/68 (01/22/2009)  Prior 10 Yr Risk Heart Disease: 20 % (06/02/2006)  Labs Reviewed: K+: 4.7 (01/09/2009) Creat: : 1.4 (01/09/2009)   Chol: 141 (07/23/2008)   HDL: 58.40 (07/23/2008)   LDL: 63 (07/23/2008)   TG: 98.0 (07/23/2008)  Orders: Venipuncture (16109) TLB-Renal Function Panel (80069-RENAL) TLB-CBC Platelet - w/Differential (85025-CBCD) TLB-TSH (Thyroid Stimulating Hormone) (84443-TSH)  Problem # 3:  HYPERLIPIDEMIA (ICD-272.4) Assessment: Unchanged  no problems with the med will recheck labs  Her updated medication list for this problem includes:    Simvastatin 40 Mg Tabs (Simvastatin) .Marland Kitchen... Take 1 tablet by mouth once a day  Labs Reviewed: SGOT: 25 (07/23/2008)   SGPT: 21 (07/23/2008)  Prior 10 Yr Risk Heart Disease: 20 % (06/02/2006)   HDL:58.40 (07/23/2008), 58.5 (11/08/2007)  LDL:63 (07/23/2008), 82 (11/08/2007)  Chol:141 (07/23/2008), 158 (11/08/2007)  Trig:98.0 (07/23/2008), 89 (11/08/2007)  Orders: TLB-Lipid Panel (80061-LIPID) TLB-Hepatic/Liver Function Pnl (80076-HEPATIC)  Problem # 4:  CORONARY ARTERY DISEASE (ICD-414.00) Assessment: Unchanged asymptomatic  Her updated medication list for this problem includes:    Metoprolol Tartrate 50 Mg Tabs (Metoprolol tartrate) .Marland Kitchen... 1 two times a day    Clonidine Hcl 0.3 Mg Tabs (Clonidine hcl) .Marland Kitchen... 1 three times a day    Hydrochlorothiazide  25 Mg Tabs (Hydrochlorothiazide) .Marland Kitchen... 1 once daily    Aspirin 81 Mg Tbec (Aspirin) .Marland Kitchen... 1 once daily    Enalapril Maleate 10 Mg Tabs (Enalapril maleate) .Marland Kitchen... Take one tablet by mouth twice a day  Problem # 5:  RENAL INSUFFICIENCY (ICD-588.9) Assessment: Unchanged baseline creat 1.4  Complete Medication List: 1)  Metoprolol Tartrate 50 Mg Tabs (Metoprolol tartrate) .Marland Kitchen.. 1 two times a day 2)  Levothyroxine Sodium 75 Mcg Tabs (Levothyroxine sodium) .... Take 1 tablet by mouth once a day 3)  Clonidine Hcl 0.3 Mg Tabs (Clonidine hcl) .Marland Kitchen.. 1 three times a day 4)  Hydrochlorothiazide 25 Mg Tabs (Hydrochlorothiazide) .Marland Kitchen.. 1 once daily 5)  Aspirin 81 Mg Tbec (Aspirin) .Marland Kitchen.. 1 once daily 6)  Ditropan 5 Mg Tabs (Oxybutynin chloride) .Marland Kitchen.. 1 once daily 7)  Simvastatin 40 Mg Tabs (Simvastatin) .... Take 1 tablet by mouth once a day 8)  Glipizide 5 Mg Tb24 (Glipizide) .... Take 1 tablet two times a day 9)  Estradiol 0.5 Mg Tabs (Estradiol) .Marland Kitchen.. 1 by mouth once daily 10)  Enalapril  Maleate 10 Mg Tabs (Enalapril maleate) .... Take one tablet by mouth twice a day  Other Orders: TLB-T4 (Thyrox), Free [id number])  Patient Instructions: 1)  Please schedule a follow-up appointment in 6 months .   Current Allergies (reviewed today): VASOTEC (ENALAPRIL MALEATE) COVERA-HS (VERAPAMIL HCL) CAPTOPRIL (CAPTOPRIL) * DYNACIRC (ISRADIPINE) ALTACE (RAMIPRIL) MACROBID (NITROFURANTOIN MONOHYD MACRO)

## 2010-02-04 NOTE — Assessment & Plan Note (Signed)
Summary: RASH/ lb   Vital Signs:  Patient profile:   77 year old female Weight:      125 pounds Temp:     97.9 degrees F oral BP sitting:   128 / 60  (left arm) Cuff size:   large  Vitals Entered By: Mervin Hack CMA Duncan Dull) (April 08, 2009 10:22 AM) CC: rash   History of Present Illness: Had right CEA in February Given cipro for kidney infection Bactroban for staph infection in nose Had "yeast" infection in groin also------  uses tube of OTC meds from [facility] (monistat) and this helped  Continued to itch Tried benedryl tabs and cream at Dr Vern Claude suggestion Itching on shoulders and legs--back also Has had "whelps" last night  Uses Dove soap usually Cetaphil lotion but not using it that much  Allergies: 1)  Vasotec (Enalapril Maleate) 2)  Covera-Hs (Verapamil Hcl) 3)  Captopril (Captopril) 4)  * Dynacirc (Isradipine) 5)  Altace (Ramipril) 6)  Macrobid (Nitrofurantoin Monohyd Macro)  Past History:  Past medical, surgical, family and social histories (including risk factors) reviewed for relevance to current acute and chronic problems.  Past Medical History: Reviewed history from 03/01/2007 and no changes required. Anemia-NOS Colonic polyps, hx of Diabetes mellitus, type II GERD Hyperlipidemia Hypertension-----------------------------------Dr Colindres-  [phone] Hypothyroidism Osteoarthritis Osteopenia Urinary incontinence Pulmonary hypertension Anxiety Renal insufficiency Coronary artery disease---------------------------Dr Wall Aortic stenosis  Past Surgical History: Hysterectomy Tonsillectomy carotidathersclerosis 40% cath  cad  2000 bilat. knee replacement  5/00 cataract  od adenosmme myoview  benign  EF-69%--8/07 carotids R  60%  l benign 09/2003 CABG/AVR--1/09 Right CEA--2/11  Family History: Reviewed history from 05/15/2008 and no changes required.  Noncontributory.   Social History: Reviewed history from 02/11/2007 and no  changes required. Retired --[employer] , Accts receivable Widowed 2007    1 son Former Smoker Alcohol use-no  Review of Systems       No trouble with sleep Feet are okay  Physical Exam  General:  alert.  NAD Skin:  scattered excoriations and papules on upper back, left arm, right upper thigh   Impression & Recommendations:  Problem # 1:  NEURODERMATITIS (ICD-698.3) Assessment New  seems to be just itch/scratch cycle will have her use cetaphil ointment or lotion after bathing triamcinolone cream three times a day as needed  benedryl sparingly (like at night)  Orders: Prescription Created Electronically [phone])  Complete Medication List: 1)  Metoprolol Tartrate 50 Mg Tabs (Metoprolol tartrate) .Marland Kitchen.. 1 two times a day 2)  Levothyroxine Sodium 75 Mcg Tabs (Levothyroxine sodium) .... Take 1 tablet by mouth once a day..1/2 tab on m, thur 3)  Clonidine Hcl 0.2 Mg Tabs (Clonidine hcl) .Marland Kitchen.. 1 three times a day 4)  Hydrochlorothiazide 25 Mg Tabs (Hydrochlorothiazide) .Marland Kitchen.. 1 once daily 5)  Aspirin 81 Mg Tbec (Aspirin) .Marland Kitchen.. 1 once daily 6)  Ditropan 5 Mg Tabs (Oxybutynin chloride) .Marland Kitchen.. 1 once daily 7)  Simvastatin 40 Mg Tabs (Simvastatin) .... Take 1 tablet by mouth once a day 8)  Glipizide 5 Mg Tb24 (Glipizide) .... Take 1 tablet two times a day 9)  Estradiol 0.5 Mg Tabs (Estradiol) .Marland Kitchen.. 1 by mouth once daily 10)  Enalapril Maleate 10 Mg Tabs (Enalapril maleate) .... Take one tablet by mouth twice a day 11)  Triamcinolone Acetonide 0.5 % Crea (Triamcinolone acetonide) .... Apply three times a day to rash and itchy areas  Patient Instructions: 1)  Please call if Dr Jarold Motto for an evaluation if you are not better  in the next couple of weeks 2)  Please schedule a follow-up appointment in 4 months .  3)  Try cerave or cetaphil ointment or lotion after every time you bathe Prescriptions: TRIAMCINOLONE ACETONIDE 0.5 % CREA (TRIAMCINOLONE ACETONIDE) apply three times a day to rash and  itchy areas  #1 tube x 2   Entered and Authorized by:   Cindee Salt MD   Signed by:   Cindee Salt MD on 04/08/2009   Method used:   Electronically to        [facility] [address] (retail)       [address], [address]       [place], [place]  [address]       Ph: [phone]       Fax: [phone]   RxID:   [id number]   Current Allergies (reviewed today): VASOTEC (ENALAPRIL MALEATE) COVERA-HS (VERAPAMIL HCL) CAPTOPRIL (CAPTOPRIL) * DYNACIRC (ISRADIPINE) ALTACE (RAMIPRIL) MACROBID (NITROFURANTOIN MONOHYD MACRO)

## 2010-02-04 NOTE — Progress Notes (Signed)
Summary: refill request for amox  Phone Note Refill Request Call back at Home Phone [phone] Message from:  Patient  Refills Requested: Medication #1:  amox Pt states she is going to the dentist tomorrow and needs to take this prior.  This is not on her med list, she got it from her cardiologist last time.  Uses [facility] [address].  Please let pt know when sent in.  Initial call taken by: Lowella Petties CMA, AAMA,  November 21, 2009 10:43 AM  Follow-up for Phone Call        okay to fill amoxil 500mg   #4 x 1 take about 1 hour before dental procedure Follow-up by: Cindee Salt MD,  November 21, 2009 1:49 PM  Additional Follow-up for Phone Call Additional follow up Details #1::        patient advised and rx sent to pharmacy.Consuello Masse CMA   Additional Follow-up by: Benny Lennert CMA Duncan Dull),  November 21, 2009 2:44 PM    New/Updated Medications: AMOXICILLIN 500 MG CAPS (AMOXICILLIN) take one hour before procedure Prescriptions: AMOXICILLIN 500 MG CAPS (AMOXICILLIN) take one hour before procedure  #4 x 1   Entered by:   Benny Lennert CMA (AAMA)   Authorized by:   Cindee Salt MD   Signed by:   Benny Lennert CMA (AAMA) on 11/21/2009   Method used:   Electronically to        [facility] [address] (retail)       [address], [address]       [place], [place]  [address]       Ph: [phone]       Fax: [phone]   RxID:   [id number]

## 2010-02-04 NOTE — Letter (Signed)
Summary: [facility]  [facility]   Imported By: Lanelle Bal 03/20/2009 13:17:12  _____________________________________________________________________  External Attachment:    Type:   Image     Comment:   External Document  Appended Document: [facility] doing well after right carotid endarterectomy

## 2010-02-06 ENCOUNTER — Ambulatory Visit: Admit: 2010-02-06 | Payer: Self-pay | Admitting: Cardiovascular Disease

## 2010-02-06 ENCOUNTER — Ambulatory Visit: Payer: Self-pay | Admitting: Cardiovascular Disease

## 2010-02-06 NOTE — Letter (Signed)
Summary: [facility] ONC/[facility]  [facility] ONC/[facility]   Imported By: Lester [place] 12/27/2009 11:05:33  _____________________________________________________________________  External Attachment:    Type:   Image     Comment:   External Document

## 2010-02-13 ENCOUNTER — Telehealth: Payer: Self-pay | Admitting: Internal Medicine

## 2010-02-17 ENCOUNTER — Ambulatory Visit (INDEPENDENT_AMBULATORY_CARE_PROVIDER_SITE_OTHER): Payer: Medicare Other | Admitting: Internal Medicine

## 2010-02-17 ENCOUNTER — Encounter: Payer: Self-pay | Admitting: Internal Medicine

## 2010-02-17 DIAGNOSIS — E785 Hyperlipidemia, unspecified: Secondary | ICD-10-CM

## 2010-02-17 DIAGNOSIS — I10 Essential (primary) hypertension: Secondary | ICD-10-CM

## 2010-02-17 DIAGNOSIS — I2510 Atherosclerotic heart disease of native coronary artery without angina pectoris: Secondary | ICD-10-CM

## 2010-02-17 DIAGNOSIS — E119 Type 2 diabetes mellitus without complications: Secondary | ICD-10-CM

## 2010-02-20 NOTE — Progress Notes (Signed)
Summary: GLIPIZIDE  Phone Note Refill Request [facility]:  [facility] [phone] on February 13, 2010 10:57 AM  Refills Requested: Medication #1:  GLIPIZIDE 5 MG  TB24 Take 1 tablet two times a day   Last Refilled: 02/07/2010 E-Scribe Request, spoke with pharmacist and they gave pt 10tablets emergency supply, called and left message on machine that pt needs to be seen.   Method Requested: Electronic Initial call taken by: Mervin Hack CMA Duncan Dull),  February 13, 2010 11:03 AM  Follow-up for Phone Call         Pt called and said  that she will run out of medicine today.  Appt made for next week.         Lowella Petties CMA, AAMA  February 13, 2010 11:31 AM  sent in supply to pharmacy DeShannon Katrinka Blazing CMA Duncan Dull)  February 13, 2010 11:34 AM      Prescriptions: GLIPIZIDE 5 MG  TB24 (GLIPIZIDE) Take 1 tablet two times a day  #60 Each x 0   Entered by:   Mervin Hack CMA (AAMA)   Authorized by:   Cindee Salt MD   Signed by:   Mervin Hack CMA (AAMA) on 02/13/2010   Method used:   Electronically to        [facility] [address] (retail)       [address], [address]       [place], [place]  [address]       Ph: [phone]       Fax: [phone]   RxID:   [id number]

## 2010-02-21 ENCOUNTER — Ambulatory Visit (INDEPENDENT_AMBULATORY_CARE_PROVIDER_SITE_OTHER): Payer: Medicare Other | Admitting: Cardiology

## 2010-02-21 ENCOUNTER — Encounter: Payer: Self-pay | Admitting: Cardiology

## 2010-02-21 DIAGNOSIS — R002 Palpitations: Secondary | ICD-10-CM

## 2010-02-21 DIAGNOSIS — I2510 Atherosclerotic heart disease of native coronary artery without angina pectoris: Secondary | ICD-10-CM

## 2010-02-21 DIAGNOSIS — I10 Essential (primary) hypertension: Secondary | ICD-10-CM

## 2010-02-21 DIAGNOSIS — I359 Nonrheumatic aortic valve disorder, unspecified: Secondary | ICD-10-CM

## 2010-02-21 LAB — CONVERTED CEMR LAB
Albumin: 3.8 g/dL (ref 3.5–5.2)
Alkaline Phosphatase: 94 units/L (ref 39–117)
BUN: 31 mg/dL — ABNORMAL HIGH (ref 6–23)
CO2: 24 meq/L (ref 19–32)
Calcium: 8.9 mg/dL (ref 8.4–10.5)
Cholesterol: 166 mg/dL (ref 0–200)
Eosinophils Absolute: 0.2 10*3/uL (ref 0.0–0.7)
Glucose, Bld: 218 mg/dL — ABNORMAL HIGH (ref 70–99)
HCT: 31 % — ABNORMAL LOW (ref 36.0–46.0)
HDL: 61 mg/dL (ref >39)
Lymphocytes Relative: 11 % — ABNORMAL LOW (ref 12–46)
Lymphs Abs: 0.9 10*3/uL (ref 0.7–4.0)
MCV: 91.7 fL (ref 78.0–100.0)
Monocytes Relative: 9 % (ref 3–12)
Neutrophils Relative %: 77 % (ref 43–77)
Platelets: 235 10*3/uL (ref 150–400)
Potassium: 4.9 meq/L (ref 3.5–5.3)
RBC: 3.38 M/uL — ABNORMAL LOW (ref 3.87–5.11)
Sodium: 137 meq/L (ref 135–145)
Total Protein: 7.2 g/dL (ref 6.0–8.3)
Triglycerides: 132 mg/dL (ref <150)
WBC: 8.7 10*3/uL (ref 4.0–10.5)

## 2010-02-24 ENCOUNTER — Telehealth: Payer: Self-pay | Admitting: Cardiology

## 2010-02-26 NOTE — Assessment & Plan Note (Signed)
Summary: 66fm per amanda in Blue/lg  Medications Added METOPROLOL TARTRATE 50 MG TABS (METOPROLOL TARTRATE) Take 1 and 1/2 tablets two times a day      Allergies Added:   Visit Type:  follow-up 6 mo Primary Provider:  Cindee Salt MD  CC:  pt thinks she is having some gas pains, flutters, , still attending heart track. loosing weight and is not trying to loose weight. periodically arms ache. 1 mo ago pt fell and hit head, and the fall made her neck sore.Marland Kitchen  History of Present Illness: [name] returns today with complaints of heart fluttering at night. Not sustained, no other sxs. No angina or SOB. Remains very active.  Carotid dopplers stable 01/28/09.   Current Medications (verified): 1)  Metoprolol Tartrate 50 Mg Tabs (Metoprolol Tartrate) .Marland Kitchen.. 1 Two Times A Day 2)  Levothyroxine Sodium 75 Mcg Tabs (Levothyroxine Sodium) .... Take 1 Tablet By Mouth Once A Day..1/2 Tab On M, Thur 3)  Clonidine Hcl 0.2 Mg Tabs (Clonidine Hcl) .Marland Kitchen.. 1 Three Times A Day 4)  Hydrochlorothiazide 25 Mg Tabs (Hydrochlorothiazide) .Marland Kitchen.. 1 Once Daily 5)  Ditropan 5 Mg Tabs (Oxybutynin Chloride) .Marland Kitchen.. 1 Once Daily 6)  Simvastatin 40 Mg  Tabs (Simvastatin) .... Take 1 Tablet By Mouth Once A Day 7)  Glipizide 5 Mg  Tb24 (Glipizide) .... Take 1 Tablet Two Times A Day 8)  Estradiol 0.5 Mg Tabs (Estradiol) .Marland Kitchen.. 1 By Mouth Once Daily 9)  Enalapril Maleate 10 Mg Tabs (Enalapril Maleate) .... Take One Tablet By Mouth Twice A Day 10)  Hydralazine Hcl 50 Mg Tabs (Hydralazine Hcl) .... Take One Tablet By Mouth Three Times A Day For High Blood Pressure As Needed 11)  Aspirin 81 Mg Tbec (Aspirin) .Marland Kitchen.. 1 Once Daily  Allergies (verified): 1)  Vasotec (Enalapril Maleate) 2)  Covera-Hs (Verapamil Hcl) 3)  Captopril (Captopril) 4)  * Dynacirc (Isradipine) 5)  Altace (Ramipril) 6)  Macrobid (Nitrofurantoin Monohyd Macro)  Past History:  Past Medical History: Last updated: 03/01/2007 Anemia-NOS Colonic polyps,  hx of Diabetes mellitus, type II GERD Hyperlipidemia Hypertension-----------------------------------Dr Colindres-  [phone] Hypothyroidism Osteoarthritis Osteopenia Urinary incontinence Pulmonary hypertension Anxiety Renal insufficiency Coronary artery disease---------------------------Dr Amador Braddy Aortic stenosis  Past Surgical History: Last updated: 04/08/2009 Hysterectomy Tonsillectomy carotidathersclerosis 40% cath  cad  2000 bilat. knee replacement  5/00 cataract  od adenosmme myoview  benign  EF-69%--8/07 carotids R  60%  l benign 09/2003 CABG/AVR--1/09 Right CEA--2/11  Family History: Last updated: 05/15/2008  Noncontributory.   Social History: Last updated: 02/11/2007 Retired --[employer] , Accts receivable Widowed 2007    1 son Former Smoker Alcohol use-no  Risk Factors: Smoking Status: quit (05/27/2006)  Review of Systems       negative other than HPI  Vital Signs:  Patient profile:   77 year old female Height:      62.25 inches Weight:      121.25 pounds BMI:     22.08 Pulse rate:   69 / minute Resp:     18 per minute BP sitting:   150 / 62  (left arm) Cuff size:   large  Vitals Entered By: [name], CMA (February 21, 2010 9:41 AM)  Physical Exam  General:  Well developed, well nourished, in no acute distress. Head:  normocephalic and atraumatic Eyes:  PERRLA/EOM intact; conjunctiva and lids normal. Neck:  Neck supple, no JVD. No masses, thyromegaly or abnormal cervical nodes. Lungs:  Clear bilaterally to auscultation and percussion. Heart:  RRR, NL  S1 AND S2, NO DIASTOLIC MURMUR, BILATERAL CAROTID BRUITS Msk:  Back normal, normal gait. Muscle strength and tone normal. Pulses:  pulses normal in all 4 extremities Extremities:  trace left pedal edema and trace right pedal edema.   Neurologic:  Alert and oriented x 3. Skin:  Intact without lesions or rashes. Psych:  Normal affect.   EKG  Procedure date:   02/21/2010  Findings:      NSR, NO ACUTE CHANGE, STABLE   Impression & Recommendations:  Problem # 1:  CORONARY ARTERY DISEASE (ICD-414.00) Assessment Unchanged  Her updated medication list for this problem includes:    Metoprolol Tartrate 50 Mg Tabs (Metoprolol tartrate) .Marland Kitchen... Take 1 and 1/2 tablets two times a day    Enalapril Maleate 10 Mg Tabs (Enalapril maleate) .Marland Kitchen... Take one tablet by mouth twice a day    Aspirin 81 Mg Tbec (Aspirin) .Marland Kitchen... 1 once daily  Orders: EKG w/ Interpretation (93000)  Problem # 2:  AORTIC STENOSIS (ICD-424.1) Assessment: Improved  Her updated medication list for this problem includes:    Metoprolol Tartrate 50 Mg Tabs (Metoprolol tartrate) .Marland Kitchen... Take 1 and 1/2 tablets two times a day    Hydrochlorothiazide 25 Mg Tabs (Hydrochlorothiazide) .Marland Kitchen... 1 once daily    Enalapril Maleate 10 Mg Tabs (Enalapril maleate) .Marland Kitchen... Take one tablet by mouth twice a day  Problem # 3:  HYPERTENSION (ICD-401.9) Assessment: Deteriorated  INCREASE METOPROLOL Her updated medication list for this problem includes:    Metoprolol Tartrate 50 Mg Tabs (Metoprolol tartrate) .Marland Kitchen... Take 1 and 1/2 tablets two times a day    Clonidine Hcl 0.2 Mg Tabs (Clonidine hcl) .Marland Kitchen... 1 three times a day    Hydrochlorothiazide 25 Mg Tabs (Hydrochlorothiazide) .Marland Kitchen... 1 once daily    Enalapril Maleate 10 Mg Tabs (Enalapril maleate) .Marland Kitchen... Take one tablet by mouth twice a day    Hydralazine Hcl 50 Mg Tabs (Hydralazine hcl) .Marland Kitchen... Take one tablet by mouth three times a day for high blood pressure as needed    Aspirin 81 Mg Tbec (Aspirin) .Marland Kitchen... 1 once daily  Orders: EKG w/ Interpretation (93000)  Problem # 4:  PALPITATIONS (ICD-785.1) Assessment: Deteriorated INCREASE METOPROLOL Her updated medication list for this problem includes:    Metoprolol Tartrate 50 Mg Tabs (Metoprolol tartrate) .Marland Kitchen... Take 1 and 1/2 tablets two times a day    Enalapril Maleate 10 Mg Tabs (Enalapril maleate) .Marland Kitchen... Take  one tablet by mouth twice a day    Aspirin 81 Mg Tbec (Aspirin) .Marland Kitchen... 1 once daily  Problem # 5:  CAROTID ARTERY DISEASE (ICD-433.10) Assessment: Unchanged REVIEWED RESULTS WITH PT, REPEAT IN ONE YEAR. Her updated medication list for this problem includes:    Aspirin 81 Mg Tbec (Aspirin) .Marland Kitchen... 1 once daily  Patient Instructions: 1)  Your physician recommends that you schedule a follow-up appointment in: 6 months with Dr. Daleen Squibb 2)  Your physician has recommended you make the following change in your medication: Increase Metoprolol to 75 mg twice a day Prescriptions: METOPROLOL TARTRATE 50 MG TABS (METOPROLOL TARTRATE) Take 1 and 1/2 tablets two times a day  #75 x 11   Entered by:   Lisabeth Devoid RN   Authorized by:   Gaylord Shih, MD, [facility]   Signed by:   Lisabeth Devoid RN on 02/21/2010   Method used:   Electronically to        [facility] (retail)       [address], [address]  [name], [place]  [address]       Ph: [phone]       Fax: [phone]   RxID:   [id number]

## 2010-02-26 NOTE — Assessment & Plan Note (Signed)
Summary: Medication refill   Vital Signs:  Patient profile:   77 year old female Weight:      122 pounds Temp:     97.8 degrees F oral Pulse rate:   75 / minute Pulse rhythm:   regular BP sitting:   226 / 79  (left arm) Cuff size:   regular  Vitals Entered By: Mervin Hack CMA Duncan Dull) (February 17, 2010 11:38 AM)  Serial Vital Signs/Assessments:  Time      Position  BP       Pulse  Resp  Temp     By           R Arm     150/82                         Cindee Salt MD  CC: Medication refill   History of Present Illness: Feels that she is better now Had episode of diarrhea and even passed some blood at the end Ended about 2 weeks ago No abd pain or fever appetite is okay now but not as much as she used to   Still has some ongoing inontinence just uses pads Generally occurs at night and rarely in day  BP up and down all the time Okay at Heart Track last week Up like today at times Rx given for hydralazine to use as needed but she hasn't used it She skips the clonidine at times depending on her BP  No chest pain No SOB  Checks sugars occ last was yesterday--82 no sig hypoglycemia  Allergies: 1)  Vasotec (Enalapril Maleate) 2)  Covera-Hs (Verapamil Hcl) 3)  Captopril (Captopril) 4)  * Dynacirc (Isradipine) 5)  Altace (Ramipril) 6)  Macrobid (Nitrofurantoin Monohyd Macro)  Past History:  Past medical, surgical, family and social histories (including risk factors) reviewed for relevance to current acute and chronic problems.  Past Medical History: Reviewed history from 03/01/2007 and no changes required. Anemia-NOS Colonic polyps, hx of Diabetes mellitus, type II GERD Hyperlipidemia Hypertension-----------------------------------Dr Colindres-  [phone] Hypothyroidism Osteoarthritis Osteopenia Urinary incontinence Pulmonary hypertension Anxiety Renal insufficiency Coronary artery disease---------------------------Dr Wall Aortic  stenosis  Past Surgical History: Reviewed history from 04/08/2009 and no changes required. Hysterectomy Tonsillectomy carotidathersclerosis 40% cath  cad  2000 bilat. knee replacement  5/00 cataract  od adenosmme myoview  benign  EF-69%--8/07 carotids R  60%  l benign 09/2003 CABG/AVR--1/09 Right CEA--2/11  Family History: Reviewed history from 05/15/2008 and no changes required.  Noncontributory.   Social History: Reviewed history from 02/11/2007 and no changes required. Retired --[employer] , Accts receivable Widowed 2007    1 son Former Smoker Alcohol use-no  Review of Systems       weight is down 5#--relates to decreased appetite sleeps okay most of the time Occ incontinence if she is not careful (like drinking more tea)  Physical Exam  General:  normal appearance.   Neck:  supple, no masses, no thyromegaly, and no cervical lymphadenopathy.   Lungs:  normal respiratory effort, no intercostal retractions, no accessory muscle use, and normal breath sounds.   Heart:  normal rate, regular rhythm, and no gallop.   Gr 2/6 aortic systolic murmur  Abdomen:  soft and non-tender.   Skin:  small non infected ulcer on back of right lower calf  Psych:  normally interactive, good eye contact, not anxious appearing, and not depressed appearing.     Impression & Recommendations:  Problem # 1:  HYPERTENSION (ICD-401.9)  Assessment Comment Only  repeat by me 150/82 discussed that she needs to be consistent with clonidine dosing to avoid rebound due for labs  Her updated medication list for this problem includes:    Metoprolol Tartrate 50 Mg Tabs (Metoprolol tartrate) .Marland Kitchen... 1 two times a day    Clonidine Hcl 0.2 Mg Tabs (Clonidine hcl) .Marland Kitchen... 1 three times a day    Hydrochlorothiazide 25 Mg Tabs (Hydrochlorothiazide) .Marland Kitchen... 1 once daily    Enalapril Maleate 10 Mg Tabs (Enalapril maleate) .Marland Kitchen... Take one tablet by mouth twice a day    Hydralazine Hcl 50 Mg Tabs (Hydralazine  hcl) .Marland Kitchen... Take one tablet by mouth three times a day for high blood pressure as needed  BP today: 226/79 Prior BP: 162/100 (08/15/2009)  Prior 10 Yr Risk Heart Disease: 20 % (06/02/2006)  Labs Reviewed: K+: 4.8 (01/28/2009) Creat: : 1.4 (01/28/2009)   Chol: 158 (01/28/2009)   HDL: 61.10 (01/28/2009)   LDL: 71 (01/28/2009)   TG: 131.0 (01/28/2009)  Orders: TLB-Renal Function Panel (80069-RENAL) TLB-CBC Platelet - w/Differential (85025-CBCD) TLB-TSH (Thyroid Stimulating Hormone) (84443-TSH)  Problem # 2:  DIABETES MELLITUS, TYPE II (ICD-250.00) Assessment: Unchanged  seems to be fine will check A1c  Her updated medication list for this problem includes:    Glipizide 5 Mg Tb24 (Glipizide) .Marland Kitchen... Take 1 tablet two times a day    Enalapril Maleate 10 Mg Tabs (Enalapril maleate) .Marland Kitchen... Take one tablet by mouth twice a day    Aspirin 81 Mg Tbec (Aspirin) .Marland Kitchen... 1 once daily  Labs Reviewed: Creat: 1.4 (01/28/2009)     Last Eye Exam: No retinopathy. Cataracts noted (06/05/2008) Reviewed HgBA1c results: 7.0 (01/28/2009)  6.8 (07/23/2008)  Orders: TLB-A1C / Hgb A1C (Glycohemoglobin) (83036-A1C)  Problem # 3:  HYPOTHYROIDISM (ICD-244.9) Assessment: Unchanged seems to be euthyroid will check labs  Her updated medication list for this problem includes:    Levothyroxine Sodium 75 Mcg Tabs (Levothyroxine sodium) .Marland Kitchen... Take 1 tablet by mouth once a day..1/2 tab on m, thur  Problem # 4:  CORONARY ARTERY DISEASE (ICD-414.00) Assessment: Unchanged no sig symptoms no change in functional status  Her updated medication list for this problem includes:    Metoprolol Tartrate 50 Mg Tabs (Metoprolol tartrate) .Marland Kitchen... 1 two times a day    Clonidine Hcl 0.2 Mg Tabs (Clonidine hcl) .Marland Kitchen... 1 three times a day    Hydrochlorothiazide 25 Mg Tabs (Hydrochlorothiazide) .Marland Kitchen... 1 once daily    Enalapril Maleate 10 Mg Tabs (Enalapril maleate) .Marland Kitchen... Take one tablet by mouth twice a day    Hydralazine Hcl  50 Mg Tabs (Hydralazine hcl) .Marland Kitchen... Take one tablet by mouth three times a day for high blood pressure as needed    Aspirin 81 Mg Tbec (Aspirin) .Marland Kitchen... 1 once daily  Complete Medication List: 1)  Metoprolol Tartrate 50 Mg Tabs (Metoprolol tartrate) .Marland Kitchen.. 1 two times a day 2)  Levothyroxine Sodium 75 Mcg Tabs (Levothyroxine sodium) .... Take 1 tablet by mouth once a day..1/2 tab on m, thur 3)  Clonidine Hcl 0.2 Mg Tabs (Clonidine hcl) .Marland Kitchen.. 1 three times a day 4)  Hydrochlorothiazide 25 Mg Tabs (Hydrochlorothiazide) .Marland Kitchen.. 1 once daily 5)  Ditropan 5 Mg Tabs (Oxybutynin chloride) .Marland Kitchen.. 1 once daily 6)  Simvastatin 40 Mg Tabs (Simvastatin) .... Take 1 tablet by mouth once a day 7)  Glipizide 5 Mg Tb24 (Glipizide) .... Take 1 tablet two times a day 8)  Estradiol 0.5 Mg Tabs (Estradiol) .Marland Kitchen.. 1 by mouth once daily 9)  Enalapril Maleate 10 Mg Tabs (Enalapril maleate) .... Take one tablet by mouth twice a day 10)  Hydralazine Hcl 50 Mg Tabs (Hydralazine hcl) .... Take one tablet by mouth three times a day for high blood pressure as needed 11)  Aspirin 81 Mg Tbec (Aspirin) .Marland Kitchen.. 1 once daily  Other Orders: TLB-Lipid Panel (80061-LIPID) TLB-Hepatic/Liver Function Pnl (80076-HEPATIC) Venipuncture ([address])  Patient Instructions: 1)  Please schedule a follow-up appointment in 6 months .    Orders Added: 1)  Est. Patient Level IV [99214] 2)  TLB-A1C / Hgb A1C (Glycohemoglobin) [83036-A1C] 3)  TLB-Lipid Panel [80061-LIPID] 4)  TLB-Hepatic/Liver Function Pnl [80076-HEPATIC] 5)  Venipuncture [36415] 6)  TLB-Renal Function Panel [80069-RENAL] 7)  TLB-CBC Platelet - w/Differential [85025-CBCD] 8)  TLB-TSH (Thyroid Stimulating Hormone) [60454-UJW]    Current Allergies (reviewed today): VASOTEC (ENALAPRIL MALEATE) COVERA-HS (VERAPAMIL HCL) CAPTOPRIL (CAPTOPRIL) * DYNACIRC (ISRADIPINE) ALTACE (RAMIPRIL) MACROBID (NITROFURANTOIN MONOHYD MACRO)  Appended Document: Medication refill

## 2010-03-04 NOTE — Progress Notes (Signed)
Summary: pt needs meds called in from friday   Phone Note Call from Patient Call back at Home Phone [phone]   Caller: Patient Reason for Call: Talk to Nurse, Talk to Doctor Summary of Call: pt call becasue a new medication was suppose to be called in Friday to [facility] [address] in [place] and it was not called in  Initial call taken by: Omer Jack,  February 24, 2010 9:33 AM  Follow-up for Phone Call        Phone Call Completed CALLED PHARMACY  WAS NOT AWARE DOSAGE WAS CHANGED WILL FILL SCRIPT  TODAY PT TO PICK UP LATER TODAY. Follow-up by: Scherrie Bateman, LPN,  February 24, 2010 10:05 AM

## 2010-03-15 ENCOUNTER — Encounter: Payer: Self-pay | Admitting: Cardiovascular Disease

## 2010-03-28 LAB — MRSA PCR SCREENING: MRSA by PCR: POSITIVE — AB

## 2010-03-28 LAB — URINALYSIS, ROUTINE W REFLEX MICROSCOPIC
Nitrite: NEGATIVE
Specific Gravity, Urine: 1.013 (ref 1.005–1.030)
Specific Gravity, Urine: 1.016 (ref 1.005–1.030)
Urobilinogen, UA: 0.2 mg/dL (ref 0.0–1.0)
pH: 6 (ref 5.0–8.0)

## 2010-03-28 LAB — BASIC METABOLIC PANEL
BUN: 26 mg/dL — ABNORMAL HIGH (ref 6–23)
CO2: 23 mEq/L (ref 19–32)
Chloride: 101 mEq/L (ref 96–112)
GFR calc non Af Amer: 37 mL/min — ABNORMAL LOW (ref >60)
Glucose, Bld: 139 mg/dL — ABNORMAL HIGH (ref 70–99)
Potassium: 3.8 mEq/L (ref 3.5–5.1)

## 2010-03-28 LAB — URINE CULTURE

## 2010-03-28 LAB — CBC
HCT: 23.3 % — ABNORMAL LOW (ref 36.0–46.0)
Hemoglobin: 10.8 g/dL — ABNORMAL LOW (ref 12.0–15.0)
MCHC: 34.8 g/dL (ref 30.0–36.0)
MCV: 96.6 fL (ref 78.0–100.0)
Platelets: 131 10*3/uL — ABNORMAL LOW (ref 150–400)
RBC: 3.18 MIL/uL — ABNORMAL LOW (ref 3.87–5.11)
RDW: 12.2 % (ref 11.5–15.5)
RDW: 12.3 % (ref 11.5–15.5)
WBC: 8.2 10*3/uL (ref 4.0–10.5)

## 2010-03-28 LAB — GLUCOSE, CAPILLARY
Glucose-Capillary: 144 mg/dL — ABNORMAL HIGH (ref 70–99)
Glucose-Capillary: 254 mg/dL — ABNORMAL HIGH (ref 70–99)

## 2010-03-28 LAB — COMPREHENSIVE METABOLIC PANEL
ALT: 20 U/L (ref 0–35)
Alkaline Phosphatase: 87 U/L (ref 39–117)
CO2: 25 mEq/L (ref 19–32)
Chloride: 106 mEq/L (ref 96–112)
Glucose, Bld: 198 mg/dL — ABNORMAL HIGH (ref 70–99)
Potassium: 4.5 mEq/L (ref 3.5–5.1)
Sodium: 136 mEq/L (ref 135–145)
Total Protein: 6.9 g/dL (ref 6.0–8.3)

## 2010-03-28 LAB — PROTIME-INR: Prothrombin Time: 13.3 seconds (ref 11.6–15.2)

## 2010-03-28 LAB — URINE MICROSCOPIC-ADD ON

## 2010-03-28 LAB — TYPE AND SCREEN

## 2010-04-26 ENCOUNTER — Other Ambulatory Visit: Payer: Self-pay | Admitting: Cardiology

## 2010-05-17 ENCOUNTER — Inpatient Hospital Stay (HOSPITAL_COMMUNITY)
Admission: EM | Admit: 2010-05-17 | Discharge: 2010-05-23 | DRG: 629 | Disposition: A | Discharge status: Skilled Nursing Facility | Payer: Medicare Other | Attending: Internal Medicine | Admitting: Internal Medicine

## 2010-05-17 ENCOUNTER — Emergency Department (HOSPITAL_COMMUNITY): Payer: Medicare Other

## 2010-05-17 DIAGNOSIS — Z79899 Other long term (current) drug therapy: Secondary | ICD-10-CM

## 2010-05-17 DIAGNOSIS — L02419 Cutaneous abscess of limb, unspecified: Secondary | ICD-10-CM | POA: Diagnosis present

## 2010-05-17 DIAGNOSIS — E785 Hyperlipidemia, unspecified: Secondary | ICD-10-CM | POA: Diagnosis present

## 2010-05-17 DIAGNOSIS — I2789 Other specified pulmonary heart diseases: Secondary | ICD-10-CM | POA: Diagnosis present

## 2010-05-17 DIAGNOSIS — IMO0002 Reserved for concepts with insufficient information to code with codable children: Principal | ICD-10-CM | POA: Diagnosis present

## 2010-05-17 DIAGNOSIS — K219 Gastro-esophageal reflux disease without esophagitis: Secondary | ICD-10-CM | POA: Diagnosis not present

## 2010-05-17 DIAGNOSIS — E876 Hypokalemia: Secondary | ICD-10-CM | POA: Diagnosis not present

## 2010-05-17 DIAGNOSIS — N179 Acute kidney failure, unspecified: Secondary | ICD-10-CM | POA: Diagnosis present

## 2010-05-17 DIAGNOSIS — Z952 Presence of prosthetic heart valve: Secondary | ICD-10-CM

## 2010-05-17 DIAGNOSIS — N183 Chronic kidney disease, stage 3 unspecified: Secondary | ICD-10-CM | POA: Diagnosis present

## 2010-05-17 DIAGNOSIS — K5900 Constipation, unspecified: Secondary | ICD-10-CM | POA: Diagnosis not present

## 2010-05-17 DIAGNOSIS — E1169 Type 2 diabetes mellitus with other specified complication: Principal | ICD-10-CM | POA: Diagnosis present

## 2010-05-17 DIAGNOSIS — I129 Hypertensive chronic kidney disease with stage 1 through stage 4 chronic kidney disease, or unspecified chronic kidney disease: Secondary | ICD-10-CM | POA: Diagnosis present

## 2010-05-17 DIAGNOSIS — D72829 Elevated white blood cell count, unspecified: Secondary | ICD-10-CM | POA: Diagnosis present

## 2010-05-17 DIAGNOSIS — Z8673 Personal history of transient ischemic attack (TIA), and cerebral infarction without residual deficits: Secondary | ICD-10-CM

## 2010-05-17 DIAGNOSIS — I2510 Atherosclerotic heart disease of native coronary artery without angina pectoris: Secondary | ICD-10-CM | POA: Diagnosis present

## 2010-05-17 DIAGNOSIS — A4902 Methicillin resistant Staphylococcus aureus infection, unspecified site: Secondary | ICD-10-CM | POA: Diagnosis present

## 2010-05-17 DIAGNOSIS — Z951 Presence of aortocoronary bypass graft: Secondary | ICD-10-CM

## 2010-05-17 DIAGNOSIS — D649 Anemia, unspecified: Secondary | ICD-10-CM | POA: Diagnosis present

## 2010-05-17 DIAGNOSIS — Z96659 Presence of unspecified artificial knee joint: Secondary | ICD-10-CM

## 2010-05-17 DIAGNOSIS — Z7982 Long term (current) use of aspirin: Secondary | ICD-10-CM

## 2010-05-17 DIAGNOSIS — E039 Hypothyroidism, unspecified: Secondary | ICD-10-CM | POA: Diagnosis present

## 2010-05-17 LAB — GLUCOSE, CAPILLARY: Glucose-Capillary: 110 mg/dL — ABNORMAL HIGH (ref 70–99)

## 2010-05-17 LAB — DIFFERENTIAL
Basophils Relative: 0 % (ref 0–1)
Eosinophils Absolute: 0 10*3/uL (ref 0.0–0.7)
Eosinophils Relative: 0 % (ref 0–5)
Monocytes Relative: 10 % (ref 3–12)
Neutrophils Relative %: 87 % — ABNORMAL HIGH (ref 43–77)

## 2010-05-17 LAB — POCT I-STAT, CHEM 8
Calcium, Ion: 1.08 mmol/L — ABNORMAL LOW (ref 1.12–1.32)
Glucose, Bld: 103 mg/dL — ABNORMAL HIGH (ref 70–99)
HCT: 29 % — ABNORMAL LOW (ref 36.0–46.0)
TCO2: 20 mmol/L (ref 0–100)

## 2010-05-17 LAB — CBC
MCH: 30.8 pg (ref 26.0–34.0)
Platelets: 298 10*3/uL (ref 150–400)
RBC: 3.05 MIL/uL — ABNORMAL LOW (ref 3.87–5.11)
RDW: 12.5 % (ref 11.5–15.5)
WBC: 17.6 10*3/uL — ABNORMAL HIGH (ref 4.0–10.5)

## 2010-05-18 ENCOUNTER — Inpatient Hospital Stay (HOSPITAL_COMMUNITY): Payer: Medicare Other

## 2010-05-18 DIAGNOSIS — Z01810 Encounter for preprocedural cardiovascular examination: Secondary | ICD-10-CM

## 2010-05-18 DIAGNOSIS — I10 Essential (primary) hypertension: Secondary | ICD-10-CM

## 2010-05-18 LAB — CARDIAC PANEL(CRET KIN+CKTOT+MB+TROPI)
Relative Index: INVALID (ref 0.0–2.5)
Total CK: 99 U/L (ref 7–177)
Total CK: 85 U/L (ref 7–177)

## 2010-05-18 LAB — URINALYSIS, ROUTINE W REFLEX MICROSCOPIC
Bilirubin Urine: NEGATIVE
Glucose, UA: NEGATIVE mg/dL
Ketones, ur: NEGATIVE mg/dL
pH: 5.5 (ref 5.0–8.0)

## 2010-05-18 LAB — COMPREHENSIVE METABOLIC PANEL
Alkaline Phosphatase: 133 U/L — ABNORMAL HIGH (ref 39–117)
BUN: 34 mg/dL — ABNORMAL HIGH (ref 6–23)
Chloride: 103 mEq/L (ref 96–112)
Glucose, Bld: 94 mg/dL (ref 70–99)
Potassium: 3.3 mEq/L — ABNORMAL LOW (ref 3.5–5.1)
Total Bilirubin: 0.7 mg/dL (ref 0.3–1.2)
Total Protein: 7.1 g/dL (ref 6.0–8.3)

## 2010-05-18 LAB — URINE MICROSCOPIC-ADD ON

## 2010-05-18 LAB — PROTIME-INR
INR: 1.14 (ref 0.00–1.49)
Prothrombin Time: 14.8 seconds (ref 11.6–15.2)

## 2010-05-18 LAB — GLUCOSE, CAPILLARY
Glucose-Capillary: 103 mg/dL — ABNORMAL HIGH (ref 70–99)
Glucose-Capillary: 51 mg/dL — ABNORMAL LOW (ref 70–99)
Glucose-Capillary: 101 mg/dL — ABNORMAL HIGH (ref 70–99)
Glucose-Capillary: 101 mg/dL — ABNORMAL HIGH (ref 70–99)
Glucose-Capillary: 185 mg/dL — ABNORMAL HIGH (ref 70–99)

## 2010-05-18 LAB — CBC
Hemoglobin: 9 g/dL — ABNORMAL LOW (ref 12.0–15.0)
MCHC: 33.6 g/dL (ref 30.0–36.0)
WBC: 16.9 10*3/uL — ABNORMAL HIGH (ref 4.0–10.5)

## 2010-05-18 LAB — SEDIMENTATION RATE: Sed Rate: >140 mm/hr — ABNORMAL HIGH (ref 0–22)

## 2010-05-18 LAB — VITAMIN B12: Vitamin B-12: 364 pg/mL (ref 211–911)

## 2010-05-18 NOTE — Consult Note (Signed)
[name], [name] NO.:  [id number]  MEDICAL RECORD NO.:  [id number]           PATIENT TYPE:  LOCATION:                                 FACILITY:  PHYSICIAN:  Luis Abed, MD, FACCDATE OF BIRTH:  [birth year]  DATE OF CONSULTATION: DATE OF DISCHARGE:                                CONSULTATION   HISTORY OF PRESENT ILLNESS:  This delightful 77 year old female patient needs surgery to her foot today.  I am consulted to clear her from the cardiac viewpoint.  She has no cardiac symptoms.  She does have coronary disease and there is a history of aortic stenosis.  She underwent CABG and aortic valve replacement with a tissue valve in January 2009.  She has done very well.  She saw Dr. Valera Castle, in the office last in February 21, 2010, and she was stable.  She is admitted with some cellulitis and there is further difficulty with her foot and there is plan for possible surgery today.  ALLERGIES:  She is allergic to CEPHALOSPORINS, MACROBID, and SULFA.  MEDICATIONS:  These are listed in the chart including; 1. Estradiol. 2. Alprazolam. 3. Levothyroxine. 4. Enalapril. 5. Clonidine. 6. Metoprolol. 7. Simvastatin. 8. Hydrochlorothiazide. 9. Glipizide.  OTHER MEDICAL PROBLEMS:  See the complete list below.  SOCIAL HISTORY:  The patient lives by herself.  She quit smoking in the 1950s.  FAMILY HISTORY:  There is no strong family history of coronary disease.  REVIEW OF SYSTEMS:  The patient denies chills, headache, rash, sweats, change in vision, change in hearing, nausea or vomiting, and urinary symptoms.  All other systems are reviewed and are negative other than the HPI.  PHYSICAL EXAMINATION:  GENERAL:  The patient is oriented to person, time, and place.  Affect is normal. VITAL SIGNS:  Temperature is 98.3 and blood pressure is 180/62 with a pulse of 90.  There are family members in the room. HEENT:  Head is atraumatic.  There is no jugular  venous distention.  She has bilateral carotid bruits. LUNGS:  Clear. RESPIRATORY:  Effort is not labored. CARDIAC:  S1 and S2 with a soft systolic murmur. ABDOMEN:  Soft. EXTREMITIES:  There is no significant peripheral edema.  She has the difficulty with the cellulitis in her leg.  LABORATORY DATA:  EKG reveals sinus rhythm with a decreased R-wave in V2.  There is no chest x-ray available.  Hemoglobin is 9.0.  Labs today revealed a potassium of 3.3.  BUN is 34 with a creatinine of 1.8.  I do not have a prior labs for comparison.  PROBLEMS: 1. Diabetes. 2. Gastroesophageal reflux disease. 3. Hyperlipidemia. 4. Hypothyroid. 5. History of urinary incontinence. 6. History of pulmonary hypertension. 7. Renal insufficiency. 8. Status post right carotid endarterectomy in February 2011. 9. History of palpitations, not an active problem at this time. 10.Anemia, this is being followed by the primary care team. 11.Hypokalemia, this is also being treated by the primary care team. 12.History of normal ejection fraction in the past. 13.Coronary artery disease.  The patient is post coronary artery     bypass graft  in 2009. 14.History of severe aortic stenosis.  The patient is status post     aortic valve replacement with a tissue valve in 2009. 15.The patient's overall cardiac status is stable.  She is cleared to     have foot surgery.  Careful attention should be given to her volume     status and her potassium and her hemoglobin.  Otherwise, she should     do well.  Cardiac status is stable.     Luis Abed, MD, [facility]     JDK/MEDQ  D:  05/18/2010  T:  05/18/2010  Job:  [id number]  Electronically Signed by Willa Rough MD FACC on 05/18/2010 03:14:06 PM

## 2010-05-18 NOTE — H&P (Signed)
NAMEMARKELL, [name] NO.:  [id number]  MEDICAL RECORD NO.:  [id number]           PATIENT TYPE:  E  LOCATION:  [facility]                         FACILITY:  [facility]  PHYSICIAN:  [name] Blower, MD       DATE OF BIRTH:  Apr 30, 1920  DATE OF ADMISSION:  05/17/2010 DATE OF DISCHARGE:                             HISTORY & PHYSICAL   PRIMARY CARE PHYSICIAN:  [name] Schwalbe, MD  DERMATOLOGIST:  Dr. Pilar [name].  NEPHROLOGIST:  Dr. Harvel [name] at [facility].  CHIEF COMPLAINT:  Left foot swelling.  HISTORY OF PRESENT ILLNESS:  [name] is an 77 year old Caucasian female with history of coronary artery disease, hypertension, anemia, CVA, diabetes type 2, hyperlipidemia, hypothyroidism, status post CABG, who presents with left foot swelling.  She reports that she has been seeing Dr. Adolphus [name] over the last few weeks for some redness in her foot.  She thought that Dr. Adolphus [name] believed that redness in her foot was likely dueto dermatitis and prescribed a cream.  She last saw Dr. Adolphus [name], her dermatologist, on May 13, 2010.  She has been active and has been doing some garden work including mowing the lawn over the last week.  She reports that she was recently in her garden on Tuesday.  However, on May 14, 2010, she noticed her left foot was hurting and on May 15, 2010, during the night, she noted that she had some pus coming out from her foot.  She had gone to Urgent Care on May 17, 2010, this morning, who on their evaluation, prompted the patient needed to come to the ER for further workup and management.  While the patient has been in the ER, she had a temperature of 101.9.  She has not measured her temperature at home but has been feeling cold at home.  She denies any chills, denies any nausea or vomiting, denies any chest pain or shortness of breath, denies any abdominal pain.  Did have diarrhea, reports that she has diarrhea every month or so.  She denies any  headaches.  She did report that on May 16, 2010, she has been feeling dizzy and while she was at that store, she felt dizzy which prompted lay down on the floor.  It is uncertain whether she passed out or lost consciousness.  She declined hitting the head.  The family does report that the patient does get dizzy whenever her systolic blood pressure becomes low at 110.  REVIEW OF SYSTEMS:  All systems were reviewed with the patient, was positive as per HPI, otherwise all other systems were negative.  PAST MEDICAL HISTORY: 1. Hypertension. 2. Chronic kidney disease stage II 3. Anemia. 4. Type 2 diabetes. 5. History of coronary artery disease status post CABG. 6. Hyperlipidemia. 7. Hypothyroidism. 8. Status post CABG with valve replacement. 9. History of aortic stenosis. 10.History of stress urinary incontinence status post bladder     suspension. 11.Bilateral total knee replacement. 12.History of carotid artery stenosis status post carotid     endarterectomy on the left.  SOCIAL HISTORY:  The patient lives by herself, does  not smoke, quitsmoking in the 1950s or 1960s.  Drinks 1 glass of wine twice a week. Denies any illegal drugs or substances.  FAMILY HISTORY:  Had 1 sister died in a car accident.  Another sister died at the age of 75 from old age.  The patient's parents died in the 72s, believes that her father may have diabetes.  ALLERGIES:  The patient is allergic to CEPHALOSPORINS, MACROBID, and SULFA.  HOME MEDICATIONS:  To be accurately reconciled by pharmacy but the patient is on: 1. Estradiol 0.5 mg p.o. daily. 2. Alprazolam 0.25 mg p.o. b.i.d. 3. Levothyroxine 75 mg p.o. daily. 4. Enalapril 10 mg p.o. daily. 5. Oxybutynin 5 mg p.o. every other day. 6. Clonidine 0.2 mg p.o. t.i.d. 7. Metoprolol 75 mg p.o. twice daily. 8. Simvastatin 40 mg p.o. daily 9. Hydrochlorothiazide 25 mg daily. 10.Glipizide extended release 5 mg p.o. twice daily.  PHYSICAL EXAMINATION:   VITAL SIGNS:  Temperature is 101.9, blood pressure is 166/51, heart rate 94, respirations 20, satting at 97% on room air.  While I was in the room, the patient's blood pressure was 210/90. GENERAL:  The patient was alert and oriented, does not appear to be in acute distress, was lying in bed comfortably, was talkative. HEENT:  Extraocular motions are intact.  Pupils equal, round.  Had moist mucous membranes. NECK:  Supple. HEART:  Regular with S1 and S2. LUNGS:  Clear to auscultation bilaterally. ABDOMEN:  Soft, nontender, nondistended.  Positive bowel sounds. EXTREMITIES:  The patient had good peripheral pulses with trace edema. Had some swelling over the dorsal foot as well as the lateral distal foot.  There was some redness over the dorsal as well as the lateral distal foot.  There were some ulcerated areas that could not be expressed any pus at this time. Area measures   3 x 3 cm.  RADIOLOGY/IMAGING:  X-ray of the left foot showed severe hallux valgus deformity with soft tissue prominence at the first metatarsal joint with no bony destruction evident.  LABS:  CBC shows a white count of 17.6, hemoglobin 9.9, hematocrit 29.0, platelet count 298.  Electrolytes showed sodium is 134, BUN is 36, creatinine 2.6, lactic acid is 1.5.  Blood cultures x2 drawn and pending.  ASSESSMENT AND PLAN: 1. Left lower extremity cellulitis.  Vancomycin has been started in     the ER after the blood cultures were drawn.  The patient reports     that she has an allergy to cephalosporin, Macrobid, and sulfa.  We     will add ciprofloxacin for any Gram-negative coverage.  We will get     an MRI of the left lower extremity without contrast given her     creatinine is 2.6.  The patient has cellulitis with early SIRS     which has improved with hydration and supportive measures.  We will     get a sed rate and CRP.  Based on the MRI findings, consider     getting Orthopedic consult if the bone is  involved.  Further     management of antibiotics based on the patient's clinical course     during the course of hospital stay. 2. Hypertensive urgency/malignant hypertension, not well controlled.     I suspect that given the patient is on clonidine which has not     taken yet, at this time the patient may have rebound hypertension     from not taking her scheduled dose of clonidine.  We will  continue     home medications of clonidine and metoprolol.  We will hold her     hydrochlorothiazide and enalapril for now.  We will have her on     p.r.n. hydralazine for systolic greater than 160. 3. Fever, most likely secondary to left lower extremity cellulitis. 4. Leukocytosis, likely due to left lower extremity cellulitis. 5. Acute renal failure on chronic kidney disease, stage II versus     progression of chronic acute kidney disease.  Last creatinine     recorded in our chart is in 2009.  Do not have anything recent     for comparison.  We will gently hydrate the patient with IV fluids.     We will hold her enalapril and hydrochlorothiazide given possible     acute renal failure on chronic kidney disease.  Continuing on, we     will send for UA as well. Progression of chronic kidney disease     may be due to hypertension. 6. Anemia.  We will send for anemia panel.  Based on old records, the     patient has had chronic anemia, maybe due to chronic kidney disease     and/or chronic disease. 7. Diabetes type 2.  We will have her on diabetic diet.  We will     continue glipizide with sensitive sliding-scale insulin. 8. History of coronary artery disease status post coronary artery     bypass graft.  We will trend her troponins as the patient reports     that she has had few dizzy episodes, I suspect that her     lightheadedness is likely due to possible low blood pressure as she     has had in the past, but will have her on tele given that the     patient has malignant hypertension.  If the  troponins are negative     x2 and if no further events on tele and if her blood pressure     improves, consider discontinuing tele within 24 hours. 9. Hyperlipidemia.  Continue simvastatin. 10.Hypothyroidism.  Continue levothyroxine. 11.Code status.  The patient is full code.  It was discussed with the     patient and the patient's family at the time of admission.  Time spent on admission talking to the patient's family and coordinating care was 1 hour.   [name] Blower, MD   SR/MEDQ  D:  05/17/2010  T:  05/18/2010  Job:  [id number]  Electronically Signed by Wardell Heath Ralonda Tartt  on 05/18/2010 01:32:31 AM

## 2010-05-19 LAB — DIFFERENTIAL
Basophils Absolute: 0 10*3/uL (ref 0.0–0.1)
Basophils Relative: 0 % (ref 0–1)
Lymphocytes Relative: 5 % — ABNORMAL LOW (ref 12–46)
Monocytes Absolute: 2.6 10*3/uL — ABNORMAL HIGH (ref 0.1–1.0)
Neutro Abs: 12 10*3/uL — ABNORMAL HIGH (ref 1.7–7.7)
Neutrophils Relative %: 78 % — ABNORMAL HIGH (ref 43–77)

## 2010-05-19 LAB — URINE CULTURE
Colony Count: 6000
Culture  Setup Time: 201205131115

## 2010-05-19 LAB — CBC
HCT: 23.2 % — ABNORMAL LOW (ref 36.0–46.0)
Hemoglobin: 7.7 g/dL — ABNORMAL LOW (ref 12.0–15.0)
MCHC: 33.2 g/dL (ref 30.0–36.0)
WBC: 15.5 10*3/uL — ABNORMAL HIGH (ref 4.0–10.5)

## 2010-05-19 LAB — BASIC METABOLIC PANEL
CO2: 22 mEq/L (ref 19–32)
Calcium: 8.7 mg/dL (ref 8.4–10.5)
GFR calc Af Amer: 45 mL/min — ABNORMAL LOW (ref >60)
GFR calc non Af Amer: 37 mL/min — ABNORMAL LOW (ref >60)
Potassium: 4 mEq/L (ref 3.5–5.1)
Sodium: 134 mEq/L — ABNORMAL LOW (ref 135–145)

## 2010-05-20 ENCOUNTER — Inpatient Hospital Stay (HOSPITAL_COMMUNITY): Payer: Medicare Other

## 2010-05-20 LAB — GLUCOSE, CAPILLARY
Glucose-Capillary: 165 mg/dL — ABNORMAL HIGH (ref 70–99)
Glucose-Capillary: 146 mg/dL — ABNORMAL HIGH (ref 70–99)
Glucose-Capillary: 158 mg/dL — ABNORMAL HIGH (ref 70–99)

## 2010-05-20 LAB — BASIC METABOLIC PANEL
CO2: 24 mEq/L (ref 19–32)
Chloride: 98 mEq/L (ref 96–112)
GFR calc non Af Amer: 37 mL/min — ABNORMAL LOW (ref >60)
Glucose, Bld: 129 mg/dL — ABNORMAL HIGH (ref 70–99)
Potassium: 4.4 mEq/L (ref 3.5–5.1)
Sodium: 133 mEq/L — ABNORMAL LOW (ref 135–145)

## 2010-05-20 LAB — CBC
HCT: 26.5 % — ABNORMAL LOW (ref 36.0–46.0)
Hemoglobin: 8.9 g/dL — ABNORMAL LOW (ref 12.0–15.0)
RBC: 2.89 MIL/uL — ABNORMAL LOW (ref 3.87–5.11)
WBC: 16.6 10*3/uL — ABNORMAL HIGH (ref 4.0–10.5)

## 2010-05-20 NOTE — Discharge Summary (Signed)
[name], [name]                  ACCOUNT NO.:  [id number]   MEDICAL RECORD NO.:  [id number]          PATIENT TYPE:  INP   LOCATION:  2015                         FACILITY:  [facility]   PHYSICIAN:  Salvatore Decent. Cornelius Moras, M.D. DATE OF BIRTH:  [birth year]   DATE OF ADMISSION:  01/26/2007  DATE OF DISCHARGE:                               DISCHARGE SUMMARY   Date of discharge is pending.  Tentatively scheduled for nursing home  placement.   HISTORY OF PRESENT ILLNESS:  Patient is an 77 year old widowed white  female from [place], [place] with a known history of aortic  stenosis, which has been followed by Dr. Juanito Doom over the last few  years.  She has remained quite active physically, despite her advanced  age, and she lives alone and remains functionally independent.  Her past  medical history is notable for hypertension, hyperlipidemia, and  diabetes mellitus.  She described occasional episodes of exertional  shortness of breath and chest tightness that probably first developed 2-  3 years ago.  The symptoms have been described as mild and relatively  sporadic but more recently somewhat more pronounced.  A 2D  echocardiogram was performed in August, 2008 which noted to show severe  aortic stenosis with normal left ventricular systolic function.  Specifically, the peak and mean transvalvular gradients across the valve  were 61 and 41, respectively.  This corresponded to an estimated aortic  valve area of 0.65 cm2 with a peak velocity across the valve of 392  cm/s.  Ejection fraction remained normal at 60-65%.  There was mild  aortic regurgitation.  There were no other significant abnormalities  noted.   On recent re-evaluation with Dr. Daleen Squibb, the patient reported worsening  symptoms of occasional episodes of the severe substernal chest tightness  and shortness of breath.  Due to this, she was scheduled for a left and  right heart catheterization, which was performed on January 12, 2007 by  Dr. Gala Romney.  Findings at that time revealed confirmation of severe  aortic stenosis with aortic valve area of 0.34 cm2 and a mean  transvalvular gradient of 41 mmHg.  She was also found to have  significant single vessel coronary artery disease involving a high grade  stenosis of the left anterior descending coronary artery.  Right heart  pressures remained normal.  The patient was referred to Tressie Stalker,  M.D. for surgical intervention.  Dr. Cornelius Moras evaluated the patient and her  studies and agreed with the recommendations.  She was admitted to this  hospitalization for the procedure.   PAST MEDICAL HISTORY:  1. Aortic stenosis.  2. Hypertension.  3. Hyperlipidemia.  4. Type 2 diabetes mellitus.  5. Stress urinary incontinence.  6. Asymptomatic cerebrovascular disease.  7. Possible hypothyroidism (details unclear).   PAST SURGICAL HISTORY:  1. Bilateral total knee replacement eight years ago.  2. Bladder suspension.  3. Total abdominal hysterectomy.  4. Tonsillectomy.  5. Upper GI endoscopy with dilatation approximately five years ago.   MEDICATIONS PRIOR TO ADMISSION:  1. Metoprolol 50 mg twice daily.  2. Levothyroxine 75  mcg once daily.  3. Clonidine 0.3 mg 3 times daily.  4. Premarin 0.3 mg once daily.  5. Hydrochlorothiazide 25 mg daily.  6. Aspirin 81 mg daily.  7. Avapro 300 mg daily.  8. Ditropan 5 mg daily.  9. Pravastatin 40 mg twice daily.  10.Glipizide ER 5 mg once daily.   DRUG ALLERGIES:  SULFA, MACROBID, CEPHALEXIN with unclear details.   For family history, social history, review of systems, and physical  exam, please see the history and physical done at the time of admission.   HOSPITAL COURSE:  Patient was admitted electively and on January 26, 2007, she was taken to the operating room, where she underwent the  following procedure:  Aortic valve replacement was a 21 mm [employer]  mitral flow pericardial tissue valve and coronary artery  bypass grafting  x3.  The following grafts were placed in the left internal mammary  artery to the distal left anterior descending coronary artery, saphenous  vein graft to the distal right coronary artery, and saphenous vein graft  to the third diagonal branch.  The patient tolerated the procedure well  and was taken to the surgical intensive care unit in stable condition.   POSTOPERATIVE HOSPITAL COURSE:  Patient has overall done quite well.  She did have a mild postoperative acute blood loss anemia.  This has  stabilized.  Her most recent hemoglobin and hematocrit on January 29, 2007 is 9.1 and 25.8, respectively.  She was weaned from the ventilator  without difficulty.  All routine lines, monitors, drainage devices were  discontinued in a standard fashion.  She has maintained stable  hemodynamics with normal sinus rhythm.  She has had some postoperative  mental status changes.  These are felt to be multifactorial, related to  narcotic analgesics as well as small vessel cerebrovascular disease.  This has improved over time with limitation in her narcotics.  A CT scan  was obtained postoperatively, which is negative for acute cortical  cerebrovascular accident.  It did confirm the postoperative small vessel  changes.  She has had some postoperative hypertension, and her  medications have been titrated.  This has improved with time.  Her  kidneys did show some acute renal insufficiency, but this is improving  during the postoperative period.  The most recent BUN and creatinine  dated February 01, 2007 is 26 and 1.44, respectively.  Her diabetes has  been under adequate control, using standard protocols.  She has been  restarted on oral glipizide.  She has shown a steady improvement with  rehabilitation.  She is felt to be a candidate for short-term nursing  home placement.  This is being evaluated under the care of the social  work and care managers they are arranging.  Her incisions are  healing  well without evidence of infection.  Her oxygen has been weaned, and she  maintains good saturations on room air.  Her overall status is felt to  be stable for tentative discharge to the nursing facility in the next  day or so, as the bed becomes available.   CURRENT MEDICATIONS:  1. Aspirin 81 mg daily.  2. Dulcolax 10 mg daily.  3. Premarin 0.3 mg daily.  4. Colace 200 mg daily.  5. Synthroid 75 mcg daily.  6. Lopressor 25 mg b.i.d.  7. Ditropan 5 mg daily.  8. Protonix 40 mg daily.  9. Zocor 40 mg daily.  10.Clonidine 0.3 mg q.8h.  11.Glipizide 5 mg b.i.d.  12.For pain,  Tylenol on a p.r.n. basis.   INSTRUCTIONS:  Patient should continue aggressive sternal precautions.  No lifting greater than 10 pounds.  She should continue cardiac  rehabilitation with emphasis on increasing ambulation over time.  Any  physical therapy opportunities available at the nursing facility would  be appropriate.  She will require some assistance with her activities of  daily living as well.   WOUND CARE:  The patient may shower with assistance or sponge bath.  Incisions may be cleaned gently with soap and water.  Observation for  any evidence of infection, to include erythema, drainage, etc., should  be reported to the surgeon's office.   FINAL DIAGNOSES:  1. Severe aortic stenosis and coronary artery disease, as described,      now status post aortic valve replacement and surgical      revascularization.  2. Postoperative anemia.  3. History of baseline iron-deficiency anemia, preoperative.  4. Hypertension.  5. Diabetes mellitus type 2.  6. Hyperlipidemia.  7. Stress urinary incontinence.  8. Cerebrovascular disease, including small vessel.  9. Bilateral total knee replacement eight years ago.  10.History of bladder resuspension.  11.History of abdominal hysterectomy.  12.History of tonsillectomy.  13.History of gastrointestinal endoscopy with dilatation approximately      five  years ago.  14.Allergies to SULFA, MACROBID, CEPHALEXIN.  15.History of renal artery stenosis.  16.Postoperative delirium, resolved.   FOLLOW UP:  Patient should have an appointment arranged for her three  weeks from discharge.  This can be done through the nursing facility.  Please call the [facility] office at [phone] to  arrange.  She  should also follow up with her cardiologist two weeks post discharge.  This can be arranged through [facility] at [phone].   CONDITION ON DISCHARGE:  Stable and improving.      Rowe Clack, P.A.-C.      Salvatore Decent. Cornelius Moras, M.D.  Electronically Signed    WEG/MEDQ  D:  02/01/2007  T:  02/01/2007  Job:  [id number]   cc:   Thomas C. Daleen Squibb, MD, [facility]  [name], MD  Bevelyn Buckles. Bensimhon, MD

## 2010-05-20 NOTE — Assessment & Plan Note (Signed)
[facility] OFFICE NOTE   NAME:HOLTImaan, Padgett                         MRN:          [id number]  DATE:01/07/2007                            DOB:          [birth year]    [name] returns to the office today for precatheterization evaluation  and workup for severe aortic stenosis.   HISTORY OF PRESENT ILLNESS:  She is an extremely active younger-  appearing than stage 77 year old white female whom I have been seeing  for quite some time.  She now has severe aortic stenosis with a mean  gradient of 41 mmHg.  She started to have some increased dyspnea on  exertion.  [place].   Her EF is 60%-65%, with mild left ventricular hypertrophy.  She has  moderate extensive atheroma of the descending aorta.  There is mild  mitral regurgitation.   She is extremely independent.  She mows her yard as well as several  others each week.  She states explicitly, I do not want to die in a  nursing home.   PAST MEDICAL HISTORY:   CURRENT MEDICATIONS:  1. Metoprolol 50 mg p.o. b.i.d.  2. Levothyroxine 75 mcg a day.  3. Clonidine 0.1 mg, 1 in the morning, 1 at noon, and 1/2 of one at      night.  4. Premarin 0.3 mg per day.  5. HCTZ 25 mg a day.  6. Aspirin 81 mg a day.  7. Avapro 300 mg a day.  8. Ditropan 5 mg at bedtime.  9. Pravastatin 80 mg p.o. at bedtime.  10.Glipizide extended release 5 mg a day.   She is intolerant to SULFA, MACROBID, and CEPHALEXIN.   OTHER PROBLEMS:  1. Nonobstructive cerebrovascular disease, with 60% stenosis if the      right internal carotid artery.  She has had a previous carotid      arteriogram by Dr. Constance Goltz and Dr. Samule Ohm in 2005.  She has had a      recent carotid Doppler which showed no significant change.  This      was December 21, 2006.  2. She has a history of hypertension.  3. Type 2 diabetes.  4. Hyperlipidemia.  5. She had a negative Stress Myoview  on August 11, 2005, with EF of      69%, with no ischemia.   PREVIOUS SURGERY:  Hysterectomy.   SOCIAL HISTORY:  She lives alone.  She is extremely active, as mentioned  above.  She does not smoke.  She does not drink.  She is retired.   FAMILY HISTORY:  Noncontributory.   REVIEW OF SYSTEMS:  Other than her HPI, is negative.   PHYSICAL EXAMINATION:  GENERAL:  She is in her usual good spirits.  VITAL SIGNS:  Her blood pressure today is actually fairly high at  188/88, her pulse is 65 and regular, her weight is 126.  Her pressure is  usually around 138-152 range over the last couple visits.  HEENT:  Normocephalic, atraumatic.  PERRLA.  Extraocular movements are  intact.  Sclerae are clear.  Facial asymmetry is normal.  She wears  glasses.  Dentition is satisfactory.  NECK:  Supple.  Carotids:  Equal bilateral carotid bruits, versus  referred sounds.  Thyroid is not enlarged.  Neck is supple.  LUNGS:  Clear.  HEART:  Reveals an aortic stenosis murmur.  it is hard to hear an S2  split.  There is no gallop.  PMI is slightly sustained.  ABDOMEN:  Soft.  Good bowel sounds.  No midline bruit.  No flank bruit.  No hepatomegaly.  EXTREMITIES:  Revealed no cyanosis, clubbing, or edema.  Pulses are  present.  There is no edema.  There is no sign of DVT.  SKIN:  Shows remarkable youth, considering her age.  NEUROLOGIC:  Grossly intact.   ASSESSMENT:  1. Severe aortic stenosis, with dyspnea on exertion and new onset      chest tightness as well.  2. Nonobstructive carotid disease.  There may have been a slight      progression from her last carotid Dopplers.  She is asymptomatic.  3. Labile hypertension.  4. Hyperlipidemia.   Indications, risks, and potential benefits have been discussed with Mrs.  [name].  She is scheduled for an outpatient catheterization with Dr.  Gala Romney next Wednesday January 12, 2007.  She understands and agrees to  proceed.   She will need surgical consultation  most likely after the  catheterization.  We will set this up as an outpatient.  I have  recommended Dr. Cornelius Moras or Laneta Simmers to her.     Thomas C. Daleen Squibb, MD, [facility]  Electronically Signed   TCW/MedQ  DD: 01/07/2007  DT: 01/07/2007  Job #: [id number]   cc:   [name], MD

## 2010-05-20 NOTE — Assessment & Plan Note (Signed)
OFFICE VISIT   [name], [name]  DOB:  [birth year]                                       02/06/2009  JXBJY#:[id number]   CHIEF COMPLAINT:  Carotid stenosis.   HISTORY OF PRESENT ILLNESS:  The patient is an 77 year old female  referred by Dr. Daleen Squibb for progression of carotid stenosis.  She had been  followed with serial ultrasounds for carotid stenosis and has been  asymptomatic in the past.  She continues to deny any symptoms of TIA,  amaurosis or stroke.  She is very functional and lives alone.  She takes  care of herself, doing her own meals, gets around and does her own yard  work including mowing her grass.   CHRONIC MEDICAL PROBLEMS:  Include diabetes, hypertension, both of which  are followed by Dr. Daleen Squibb and Dr. Alphonsus Sias.  These are well-controlled  currently.   PAST SURGICAL HISTORY:  She has had a hysterectomy, knee replacement and  coronary artery bypass grafting by Dr. Cornelius Moras 2 years ago.  She tolerated  this well.   FAMILY HISTORY:  Unremarkable.   SOCIAL HISTORY:  She is widowed, has one child.  She smoked for 2 years  when she was a teenager.  She does not consume alcohol regularly.   REVIEW OF SYSTEMS:  A full 12 point review of systems was performed with  the patient.  Please see intake referral form for details regarding  this.   PHYSICAL EXAM:  Vital signs:  Blood pressure is 173/80 in the left arm,  193/75 in the right arm, heart rate is 67 and regular.  Temperature is  97.8.  HEENT:  Unremarkable.  Neck:  Has 2+ carotid pulses with a right  carotid bruit.  Chest:  Clear to auscultation.  Cardiac:  Exam is  regular rate and rhythm without murmur.  Abdomen:  Soft, nontender,  nondistended.  No masses.  Extremities:  She has 2+ femoral pulses and  absent pedal pulses bilaterally.   She had a carotid duplex exam performed at [facility] on  01/01/2009.  This showed a high-grade greater than 80% right internal  carotid artery stenosis  with a 40%-60% left internal carotid artery.  We  repeated her right carotid duplex exam in our office today which again  shows a greater than 80% right internal carotid artery stenosis with  calcification.   I had a lengthy discussion with the patient today regarding right  carotid endarterectomy.  Risks, benefits, possible complications and  procedure details including but not limited to bleeding, infection,  stroke risk of 1-2%, cranial nerve injury 5-10% were explained to the  patient today.  She understands and agrees to proceed.  I have told her  to continue taking her aspirin even the day of operation with a small  sip of water.  She is going to make some arrangements for her son to  stay with her in the postoperative period.  She will call our office to  schedule her carotid endarterectomy within the next few weeks.     Janetta Hora. Fields, MD  Electronically Signed   CEF/MEDQ  [name]:  02/06/2009  T:  02/07/2009  Job:  [id number]   cc:   Thomas C. Daleen Squibb, MD, [facility]  [name], MD

## 2010-05-20 NOTE — Procedures (Signed)
CAROTID DUPLEX EXAM   INDICATION:  Followup right carotid endarterectomy.   HISTORY:  Diabetes:  Yes.  Cardiac:  CABG.  Hypertension:  Yes.  Smoking:  No.  Previous Surgery:  Right carotid endarterectomy on 02/14/2009.  CV History:  Currently asymptomatic.  Amaurosis Fugax No, Paresthesias No, Hemiparesis No                                       RIGHT             LEFT  Brachial systolic pressure:         156               158  Brachial Doppler waveforms:         Normal            Normal  Vertebral direction of flow:        Antegrade         Antegrade  DUPLEX VELOCITIES (cm/sec)  CCA peak systolic                   90                77  ECA peak systolic                   477               114  ICA peak systolic                   128               92  ICA end diastolic                   30                24  PLAQUE MORPHOLOGY:                  Heterogeneous     Mixed  PLAQUE AMOUNT:                      Moderate          Mild  PLAQUE LOCATION:                    ECA               ICA/ECA   IMPRESSION:  1. Patent right carotid endarterectomy site with mildly increased      velocity noted in the right proximal internal carotid artery which      appears to be due to a change in vessel diameter.  2. No hemodynamically significant stenosis of the left internal      carotid artery noted with mild plaque formations as described      above.  3. Significant improvement of the Doppler velocities of the right      internal carotid artery noted when compared to the previous exam on      02/06/2009.   ___________________________________________  Janetta Hora. Fields, MD   CH/MEDQ  D:  11/12/2009  T:  11/12/2009  Job:  [id number]

## 2010-05-20 NOTE — Assessment & Plan Note (Signed)
[facility] OFFICE NOTE   NAME:[name], Ronning                         MRN:          [id number]  DATE:07/17/2008                            DOB:          1920-02-10    Ms. [name] comes in today for followup.   She is having no symptoms of significant dyspnea on exertion,  palpitations, syncope, angina, but has had some mild peripheral edema.  We have had 100 degree weather with lots of humidity.  They go down in  the morning.  She is on HCTZ 25 mg a day and is not uncomfortable with  it.   She had no symptoms of TIAs or mini strokes.  Repeat carotid Dopplers on  June 28, 2008 showed left internal carotid artery stenosis at 40-59%, 60-  79% right internal carotid stenosis, and antegrade flow in both  vertebrals.  This did not show any significant progression from December  2009.  We will follow up in 6 months.   Her medications are outlined in e-chart and are unchanged.  She is on  aspirin 81 mg a day.   Please see her problem list from previous office notes as well including  her coronary bypass grafting, aortic valve replacement in January 2009.   GENERAL:  She looks remarkably younger than stated age.  VITAL SIGNS:  Her blood pressure is 146/71 in the left arm, pulse is 52  and regular.  Her weight is 126 and stable.  HEENT:  Essentially normal.  NECK:  Carotids were equal bilaterally with bilateral referred sounds  versus bruits, but in this case bruits.  Thyroid is not enlarged.  Trachea is midline.  LUNGS:  Clear to auscultation and percussion.  HEART:  A systolic murmur consistent with mild aortic valve prosthetic  disease.  There is no diastolic component.  S2 is split.  ABDOMEN:  Soft, good bowel sounds.  No midline bruit.  EXTREMITIES:  There are no cyanosis, clubbing, only trace edema.  She  has some varicose veins.  No sign of DVT.  NEUROLOGIC:  Intact.  SKIN:  Unremarkable.   Ms. [name] is  doing remarkably well.  We will see her back in 6 months at  which time she will have carotid Dopplers.  I have made no change in  medical program.     Jesse Sans. Daleen Squibb, MD, [facility]  Electronically Signed    TCW/MedQ  DD: 07/17/2008  DT: 07/18/2008  Job #: [id number]

## 2010-05-20 NOTE — Assessment & Plan Note (Signed)
OFFICE VISIT   [name], [name]  DOB:  [birth year]                                        February 07, 2007  CHART #:  [id number]   HISTORY OF PRESENT ILLNESS:  The patient returns to office for follow-up  as an add-on to our schedule today, status post aortic valve replacement  and coronary artery bypass grafting x3 on January 21.  Since hospital  discharge the patient has been at the [facility] facility in  [place].  She was seen in the office for follow-up by Dr. Daleen Squibb  earlier today and returned to [facility], where after she got there she  noticed some bloody drainage from her left lower leg saphenous vein  harvest incision.  Our office was contacted and we suggested that she  come in for [name] visit.  Overall the patient is doing quite well.  She  denies any fevers or chills.  She has not had shortness of breath.  She  has had minimal pain.  She complains that the people at [facility] are not  allowing her to walk as much as she wants to.  She developed some bloody  drainage from her left lower leg earlier today and otherwise has had no  problems with any of her surgical wounds.   PHYSICAL EXAM:  Notable for [name] well-appearing female with blood pressure  139/76, pulse 82, oxygen saturation of 98% on room air.  Her median  sternotomy incision is healing nicely.  Her chest is clear to  auscultation with perhaps slightly diminished breath sounds at the lung  bases, more so on the left than the right.  The extremities are warm and  well-perfused.  There is mild bilateral lower extremity edema.  There is  some old bloody drainage from the left lower leg saphenous vein harvest  incision.  This appears to be [name] hematoma in the subcutaneous tract that  is draining spontaneously through the incision.  There is no surrounding  erythema or cellulitis.  There is mild dermatitis at both ankles that  may be partially exacerbated by lower extremity edema.  There is no sign  of infection.  The remainder of her incisions remain intact.  The wound  is clamped and [name] dry dressing is applied.   IMPRESSION:  Spontaneous drainage of old hematoma from the subcutaneous  tract from left lower extremity endoscopic vein harvest incision.  The  patient appears to be getting along fairly well otherwise.   PLAN:  We will instruct patient care providers at [facility] to clean her  leg and dress it twice daily with dry sterile gauze.  I have reminded  the patient to keep her legs up as much as possible when she is not  ambulating, but she needs to be up and walking more.  We will plan to  see her back for further follow-up in 2 weeks.   Salvatore Decent. Cornelius Moras, M.[name].  Electronically Signed   CHO/MEDQ  [name]:  02/07/2007  T:  02/08/2007  Job:  [id number]   cc:   [name], MD  Jesse Sans. Wall, MD, [facility]

## 2010-05-20 NOTE — Assessment & Plan Note (Signed)
[facility] OFFICE NOTE   NAME:HOLTOpie, Fanton                         MRN:          [id number]  DATE:11/24/2006                            DOB:          [birth year]    CARDIOLOGY PRE-CATHETERIZATION NOTE:  Ms. Puga returns to the office  today for her severe aortic stenosis.   HISTORY OF PRESENT ILLNESS:  Ms. Moening is an extremely active, younger-  appearing than stated, 77 year old white female, whom I have been  following for quite a while.  She now has severe aortic stenosis with a  mean gradient of 41 mmHg. by 2D echocardiogram August 10, 2006.  She has  normal left ventricular systolic function, EF 60-65%, mild left  ventricular hypertrophy, and moderate extensive atheroma of the  descending aorta.  There was mild mitral regurgitation.   She has been asymptomatic, but recently at the [facility], she had some  significant dyspnea on exertion.  She has also had a little chest  tightness.   She states that she wants to stay extremely active and she also always  says, I don't want to die in a nursing home.  She is extremely active,  mowing a couple of yards a week and has a very active lifestyle.   PAST MEDICAL HISTORY:   CURRENT MEDICATIONS:  1. Metoprolol 50 mg p.o. b.i.d.  2. Levothyroxine 75 micrograms a day.  3. Clonidine 0.3 one in the morning, one at noon, half at night.  4. Premarin 0.3 daily.  5. HCTZ 25 mg a day.  6. Aspirin 81 mg a day.  7. Avapro 300 mg a day.  8. Ditropan 5 mg q.h.s.  9. Pravastatin 80 mg p.o. q.h.s.  10.Glipizide extended release 5 mg a day.   She is intolerant to SULFA, MACROBID and CEPHALEXIN.   OTHER PROBLEMS INCLUDE:  Nonobstructive cerebrovascular disease.  She  had a carotid arteriogram by Dr. Constance Goltz and Dr. Samule Ohm in 2005, which  showed a 60% stenosis on the right side.  She has been followed with  medical therapy with her last carotid Doppler not showing any  significant change.  She is asymptomatic.  She has a history of  hypertension, type 2 diabetes, hyperlipidemia.   She has had a negative stress Myoview, August 11, 2005, with an EF of 69%  with no ischemia.   PREVIOUS SURGERY:  Hysterectomy.   She does not smoke.  She does not drink alcohol.  She is retired.   FAMILY HISTORY:  Noncontributory.   REVIEW OF SYSTEMS:  Other than the HPI, is negative.   Her blood pressure today is 152/67, her pulse is 57 and regular.  She is  in sinus bradycardia.  Her electrocardiogram shows poor R progression  across the anterior precordium with ST segment changes laterally,  consistent with LVH.  Her weight is 130.  HEENT:  Normocephalic, atraumatic.  PERRLA.  Extraocular movements  intact.  Sclerae clear.  Facial symmetry is normal.  She wears glasses.  Dentition is satisfactory.  Carotid upstrokes were equal bilaterally with bilateral carotid bruits,  versus referred sounds.  Thyroid is not enlarged. The neck is supple.  LUNGS:  Clear to auscultation.  HEART:  Reveals an aortic stenosis murmur.  It is hard to hear S2 split.  There is no gallop.  PMI is slightly sustained.  ABDOMINAL EXAM:  Soft, good bowel sounds, no midline bruit, no flank  bruit, no hepatomegaly.  EXTREMITIES:  Reveal no cyanosis, clubbing or edema.  Pulses are  present.  There is no edema.  There is no sign of DVT.  SKIN:  Shows remarkable youth, considering her age.  NEUROLOGIC EXAM:  Grossly intact.  She is extremely personable.   ASSESSMENT:  1. Severe aortic stenosis with dyspnea on exertion and chest tightness      of recent onset.  2. Nonobstructive carotid artery disease.  3. Labile hypertension.  4. Hyperlipidemia.   I have had a long discussion with Ms. Joo.  She will clearly have to  have this valve replaced at some point.  Now that she is having some  symptoms, I would suggest a right and left heart catheterization and  coronary angiography.  This can be  elective and can be set up after the  holidays, in January.   Indications, risks and potential benefits have been discussed with the  patient.  She agrees to proceed.     Thomas C. Daleen Squibb, MD, [facility]  Electronically Signed    TCW/MedQ  DD: 11/24/2006  DT: 11/24/2006  Job #: [id number]   cc:   [name], MD

## 2010-05-20 NOTE — Assessment & Plan Note (Signed)
OFFICE VISIT   [name], [name]  DOB:  July 15, 1920                                       03/06/2009  EAVWU#:[id number]   The patient returns for followup today after undergoing right carotid  endarterectomy on February 10.  She had an uneventful postoperative  recovery.  She returns for followup today.  She denies any symptoms of  amaurosis, TIA or stroke.  She has had no difficulty swallowing.   PHYSICAL EXAM:  Today blood pressure is 151/71 in the right arm, blood  pressure is 125/67 in the left arm, temperature is 97.9, heart rate 55  and regular.  On exam she has bilateral carotid bruits.  She has a well-  healed right neck incision.  She has 2+ carotid pulses.  Tongue is  midline.  She also has a 3/6 systolic murmur on cardiac exam.  Upper  extremity and lower extremity motor strength is 5/5 and symmetric  bilaterally.   The patient is doing well postoperatively after a carotid  endarterectomy.  She will follow up in our carotid protocol with her  next surveillance scan in 6 months.  She will continue her aspirin  daily.  She is planning on having some dental work done soon.  I told  her to wait at least 6 weeks before this.  She usually takes  prophylactic antibiotics for her knee replacements and I told her this  should cover her Dacron patch as well.  She will follow up with me in  six months' time.     Janetta Hora. Fields, MD  Electronically Signed   CEF/MEDQ  D:  03/06/2009  T:  03/07/2009  Job:  [id number]   cc:   Thomas C. Daleen Squibb, MD, [facility]  [name], MD

## 2010-05-20 NOTE — Cardiovascular Report (Signed)
[name], [name]                  ACCOUNT NO.:  [id number]   MEDICAL RECORD NO.:  [id number]          PATIENT TYPE:  OIB   LOCATION:  1963                         FACILITY:  [facility]   PHYSICIAN:  Bevelyn Buckles. Bensimhon, MDDATE OF BIRTH:  [birth year]   DATE OF PROCEDURE:  01/12/2007  DATE OF DISCHARGE:                            CARDIAC CATHETERIZATION   PRIMARY CARE PHYSICIAN:  Dr. Tillman Abide.   CARDIOLOGIST:  Dr. Juanito Doom.   HISTORY:  Ms. Katona is a delightful 77 year old woman who actually looks  much younger and is more active than her chronological age would  suggest.  She has been recently experiencing progressive dyspnea.  She  was found to have severe aortic stenosis on echocardiogram.  She is  referred for preoperative right and left heart catheterization.   PROCEDURES PERFORMED:  1. Right heart catheterization.  2. Selective coronary angiography.  3. Left heart catheterization.  4. Ventriculogram.  5. Left subclavian angiography to assess the patency of the left      subclavian artery __________  of the LIMA.  6. Abdominal aortogram.   DESCRIPTION OF PROCEDURE:  The risks and indication of catheterization  were explained.  Consent was signed and placed on the chart.  The right  coronary was prepped and draped in routine sterile fashion, anesthetized  with 1% local lidocaine.  A 7-French venous sheath was placed in the  right femoral vein, and a standard Swan-Ganz catheter was used for the  right heart catheterization.  A 5-French arterial sheath was placed in  the right femoral artery using a modified Seldinger technique.  We were  unable to pass the normal J-wire up through the right iliac system and  had to use a Wholey wire.  Standard catheters were used including a 3-D  RC JL-4.  In order to get across the aortic valve, we used JR-4 and a  long straight exchange wire.  It took [place] about 10-15 minutes to get  across the valve.  Once cross the valve, we exchanged for a  multipurpose  catheter to perform the left ventriculogram.  An angled pigtail was used  for the abdominal aortogram.  There were no apparent complications.   HEMODYNAMIC RESULTS:  Right atrial pressure mean of 5, RV 36/2, PA 35/10  with a mean of 22, pulmonary capillary wedge pressure was mean of 12.  Fick cardiac output was 2.4 liters per minute.  Cardiac index was 1.5  liters per minute per meter squared.  Central aortic pressure is 184/59  with a mean of 106.  LV pressure was 232/90 with EDP of 17.  There was  no mitral stenosis.  Aortic valve area was 0.34 cm2 with mean gradient  of 41 consistent with critical aortic stenosis.   Left main had a mild luminal irregularity, otherwise normal.   LAD was a long vessel coursing to the apex.  It gave off a tiny first  diagonal in the mid LAD.  There was a trifurcation with a small second  diagonal and a moderate-sized third diagonal.  At that point, there was  an 80%  calcified lesion in the LAD.  In the tiny second diagonal, there  is a 95% ostial lesion.  In the moderated size third diagonal, there was  a 60% ostial lesion.  There was also 40% ostial lesion in the LAD  proper.   Left circumflex gave off a small OM-1, large branching OM-2.  There was  a minor irregularity in the mid AV groove circumflex, otherwise normal.   Right coronary artery was a dominant vessel gave off a fairly large  acute marginal branch and two small posterolaterals.  There was a 40-50%  calcified lesion in the mid proximal and mid RCA.   Left ventriculogram done in the RAO position showed an EF of 65%, no  regional wall motion abnormalities.  Abdominal aortogram showed an  ostial right renal 60% stenosis.  Left renal had about 40% stenosis in  the midsection.  The right common iliac had a 75% lesion at the ostium.  This was calcified.  There was no aortic aneurysm.   ASSESSMENT:  1. Significant one-vessel coronary artery disease in the LAD as       described above with nonobstructive disease in the right coronary.  2. Critical aortic stenosis.  3. Normal left ventricular function.  4. Peripheral arterial disease as described above.   PLAN/DISCUSSION:  She will need cardiothoracic surgery referral for CABG  and AVR.  She will need a LIMA to the LAD and possibly a vein graft to  the third diagonal.      Bevelyn Buckles. Bensimhon, MD  Electronically Signed     DRB/MEDQ  D:  01/12/2007  T:  01/12/2007  Job:  [id number]   cc:   Jesse Sans. Daleen Squibb, MD, [facility]  [name], MD

## 2010-05-20 NOTE — Op Note (Signed)
NAMEJEMINA, [name]                  ACCOUNT NO.:  [id number]   MEDICAL RECORD NO.:  [id number]          PATIENT TYPE:  INP   LOCATION:  2315                         FACILITY:  [facility]   PHYSICIAN:  Salvatore Decent. Cornelius Moras, M.D. DATE OF BIRTH:  [birth year]   DATE OF PROCEDURE:  01/26/2007  DATE OF DISCHARGE:                               OPERATIVE REPORT   PREOPERATIVE DIAGNOSIS:  Severe aortic stenosis and single vessel  coronary artery disease.   POSTOPERATIVE DIAGNOSIS:  Severe aortic stenosis and single vessel  coronary artery disease.   PROCEDURE:  Median sternotomy for aortic valve replacement (21 mm  Carbomedics Mitroflow pericardial tissue valve) and coronary artery  bypass grafting x3 (left internal mammary artery to distal left anterior  descending coronary artery, saphenous vein graft to distal right  coronary artery, saphenous vein graft to third diagonal branch,  endoscopic saphenous vein harvest from left thigh).   SURGEON:  Salvatore Decent. Cornelius Moras, M.D.   ASSISTANT:  Kerin Perna, M.D.   SECOND ASSISTANT:  Rowe Clack, P.A.-C.   ANESTHESIA:  General.   BRIEF CLINICAL NOTE:  The patient is an 77 year old female with a known  history of aortic stenosis for which she has been followed by Dr. Juanito Doom.  The patient also has a history of hypertension, hyperlipidemia,  and type 2 diabetes mellitus.  The patient now presents with symptoms of  exertional shortness of breath.  Follow up echocardiogram demonstrates  progression of aortic stenosis to the point of being quite severe with  an estimated the aortic valve area 0.65 cm2 and peak and mean  transvalvular gradients estimated at 61 of 41 mmHg respectively.  Left  ventricular function remains normal.  Left and right heart  catheterization was performed January 12, 2007.  This confirms the  presence of severe aortic stenosis.  There is also severe single vessel  coronary artery disease.  Left ventricular function is normal.  A  full  consultation note has been dictated previously.  The patient and her  family have been counseled regarding the indications, risks, and  potential benefits of surgery.  Alternative treatment strategies have  been discussed.  They understand and accept all associated risks and  desire to proceed with surgery as described.   OPERATIVE FINDINGS:  1. Normal left ventricular systolic function with moderate left      ventricular hypertrophy and significant diastolic dysfunction.  2. Severe aortic stenosis.  3. Good quality left internal mammary artery and saphenous vein      conduit for grafting.  4. Good quality target vessels for grafting.  5. Trivial mitral regurgitation.   OPERATIVE NOTE IN DETAIL:  The patient is brought to the operating room  on the above mentioned date and central monitoring was established by  the anesthesia service under the care and direction of Dr. Diamantina Monks  and Dr. Arta Bruce.  Apparently some difficulty was initially  encountered attempting to cannulate the patient's right internal jugular  vein and this was aborted.  Ultimately, the right subclavian vein was  cannulated and a Swan-Ganz catheter placed  through the right subclavian  approach.  This apparently was also somewhat difficult, and the patient  developed some mild gross hemoptysis after this was completed without  associated shortness of breath.  A radial arterial line is placed.  Intravenous antibiotics were administered.  A portable chest x-ray was  obtained in the operating room prior to induction of anesthesia.  This  confirms appropriate position of central lines with no sign of  hemothorax nor any sign of pneumothorax.  General endotracheal  anesthesia is induced uneventfully under the care and direction of Dr.  Michelle Piper.  A Foley catheter is placed.  Baseline transesophageal  echocardiogram was performed.  This confirms the presence of severe  aortic stenosis.  There is trace to mild  mitral regurgitation.  There is  normal left ventricular systolic function with moderate left ventricular  hypertrophy.  There is mild aortic insufficiency.   The patient's chest, abdomen, both groins, and both lower extremities  were prepared and draped in a sterile manner.  A median sternotomy  incision was performed and the left internal mammary artery is dissected  from the chest wall and prepared for bypass grafting.  Towards the  distal end, the left internal mammary artery becomes somewhat small, but  the portion which is utilized is normal caliber and a good quality  conduit.  Simultaneously, saphenous vein was obtained the patient's left  thigh using endoscopic vein harvest technique through a small incision  made just above the left knee.  The saphenous vein is a good quality  conduit.  Initially, a small incision is made in the right thigh but the  vein on that side appears to be small and superficial.  The right vein  was not removed.  The left greater saphenous vein is utilized for  grafting.  After the vein was been removed, both incisions in both lower  extremities are closed in multiple layers with running absorbable  suture.  The patient is heparinized systemically and the left internal  mammary artery is transected distally.  It is noted to have excellent  flow.   The pericardium was opened.  The ascending aorta is remarkably normal in  appearance for the patient's age.  The aorta is normal caliber.  The  ascending aorta was cannulated uneventfully.  The venous cannula is  placed through the tip of the right atrial appendage.  A retrograde  cardioplegic catheter was placed through the right atrium into the  coronary sinus.  Adequate heparinization is verified.  Cardiopulmonary  bypass is begun.  A left ventricular vent was placed through the right  superior pulmonary vein.  Distal target vessels were selected for  coronary bypass grafting.  A temperature probe was  placed in the left  ventricular septum and a cardioplegic catheter is placed in the  ascending aorta.   The patient is allowed to cool passively to 32 degrees systemic  temperature.  The aortic crossclamp was applied and cold blood  cardioplegia is administered initially in an antegrade fashion through  the aortic root.  Iced saline slush was applied for topical hypothermia  and supplemental cardioplegia is administered retrograde through the  coronary sinus catheter.  Repeat doses of cardioplegia are administered  intermittently throughout the crossclamp portion of the operation  through the aortic root, down subsequently placed vein grafts, and  retrograde through the coronary sinus catheter to maintain left  ventricular septal temperature below 15 degrees centigrade.   The following distal coronary anastomoses were performed:  1. The  distal right coronary artery is grafted with a saphenous vein      graft in an end-to-side fashion.  This vessel measures 2 mm in      diameter and is a good quality target vessel for grafting.  There      is 40-50% tubular proximal stenosis which is not high grade but      felt significant enough to warrant grafting at the time of surgery.  2. The third diagonal branch off the left anterior descending coronary      artery is grafted with a saphenous vein graft in an end-to-side      fashion.  This vessel measured 1.5 mm in diameter and is a good      quality target vessel for grafting.  3. The distal left anterior descending coronary artery is grafted with      the left internal mammary artery in an end-to-side fashion.  This      vessel measured 1.5 mm in diameter and is a good quality target      vessel for grafting.   An oblique aortotomy incision is performed.  The aortic valve was  inspected.  The aortic valve is tricuspid and heavily calcified and  stenotic.  The left main and right coronary artery are both in the  normal anatomical location.   The aortic valve was excised sharply.  The  aortic annulus was decalcified.  This is technically straightforward.  The aortic annulus was sized to accept a 21 mm stented bioprosthetic  tissue valve.  Aortic valve replacement was performed using interrupted  2-0 Ethibond horizontal mattress pledgeted sutures with pledgets in the  subannular position.  A CarboMedics mitral flow aortic pericardial  tissue valve (catalog number [id number], serial number [id number]) is secured in  place uneventfully.  The valve sits supra-annularly but seats nicely in  without difficulty.  There appears to be ample distance between the  sewing ring and the left main and the right coronary artery.  Rewarming  is begun.   The aortotomy incision is closed using a two layer closure of running 4-  0 Prolene suture.  Both proximal saphenous vein anastomoses are  performed directly to the ascending aorta prior to removal of the aortic  crossclamp.  The left ventricular septal temperature rises rapidly with  reperfusion of the left internal mammary artery.  One final dose of warm  retrograde hot shot cardioplegia is administered.  The lungs were  ventilated and the heart allowed to fill to evacuate any residual air  through the aortic root.  The aortic crossclamp was removed after a  total crossclamp time of 113 minutes.  The heart began to beat  spontaneously without need for cardioversion.  All proximal and distal  coronary anastomoses were inspected for hemostasis and appropriate graft  orientation.  Epicardial pacing wires were affixed to the right  ventricular free wall and to the right atrial appendage.  The left  ventricular vent was removed.  The patient is rewarmed to 37 degrees  centigrade temperature.  The patient's rhythm at separation from bypass  is junctional rhythm.  AV sequential pacing is employed.  Total  cardiopulmonary bypass time for the operation is 140 minutes.  No  inotropic support is required.    Follow up transesophageal echocardiogram performed by Dr. Michelle Piper after  separation from bypass demonstrates preserved left ventricular function.  There is a well seated bioprosthetic tissue valve in the aortic  position.  There is no perivalvular leak.  No other significant  abnormalities are noted and there is insignificant residual air.   The venous and arterial cannulae are removed uneventfully.  Protamine is  administered to reverse the anticoagulation.  The mediastinum and left  chest are irrigated with saline solution containing vancomycin.  Meticulous surgical hemostasis is ascertained.  The mediastinum and both  left and right pleural spaces are drained using four chest tubes exited  through separate stab incisions inferiorly.  Some old clot is  appreciated in the right pleural space, presumably from line placement  at the beginning of the procedure.  The pericardium and soft tissues  anterior to the aorta are reapproximated loosely.  The sternum was  closed with double strength sternal wire.  Soft tissues anterior to the  sternum are closed in multiple layers and the skin is closed with a  running subcuticular skin closure.   The patient tolerated the procedure well and was transported to the  surgical intensive care unit in stable condition.  There are no  intraoperative complications.  All sponge, instrument and needle counts  were verified correct at the completion of the operation.  The patient  was transfused one pack of adult platelets and 2 units fresh frozen  plasma for thrombocytopenia and mild coagulopathy after reversal of  heparin with protamine.  There was concern regarding possible  inadvertent contamination of a portion of the set at the very end of the  procedure.  The area was isolated  with sterile towels to prevent contamination of the patient's wounds or  any of the other surgical instruments and this was recognized promptly  at the time that the object was  dropped from above the anesthesia screen  onto the operative field.      Salvatore Decent. Cornelius Moras, M.D.  Electronically Signed     CHO/MEDQ  D:  01/26/2007  T:  01/26/2007  Job:  [id number]   cc:   Thomas C. Daleen Squibb, MD, [facility]  [name], MD  Bevelyn Buckles. Bensimhon, MD

## 2010-05-20 NOTE — Assessment & Plan Note (Signed)
[facility] OFFICE NOTE   NAME:[name], [name]                         MRN:          [id number]  DATE:07/30/2006                            DOB:          [birth year]    [name] returns today for further management of the following issues:  1. Atherosclerotic coronary artery disease.  She is currently having      no angina or any anginal equivalent.  Last stress Myoview was      August 11, 2005.  EF of 69% with no ischemia.  2. Moderate aortic stenosis by 2D echo in 2006.  She is currently      having no symptoms of syncope, chest pain, or angina.  3. Cerebrovascular disease.  Carotid Dopplers in January and also June 29, 2006, showed no progression in her carotid disease.  Please see      report.  She has antegrade flow in both vertebrals.  4. Hyperlipidemia, followed by Dr. Tillman Abide.  5. Labile hypertension, followed by Dr. Alphonsus Sias.   She mowed 2 yards yesterday, a total of 4 hours of work!  She is  remarkable.  She says, I don't want to die in a nursing home.  She  also vehemently states she does not want to go to [facility] if she becomes acutely ill.  She lives in the [place] [facility] and so I told her she may be able to be taken directly to  [facility].   MEDICATIONS:  1. Metoprolol 50 b.i.d.  2. Levothyroxine 75 mcg a day.  3. Clonidine 0.3 q.a.m., one at noon, half at bedtime.  4. Premarin 0.3 mg a day.  5. HCTZ 25 mg a day.  6. Aspirin 81 mg a day.  7. Avapro 300 mg a day.  8. Ditropan 5 mg q.h.s.  9. Pravastatin 80 mg q.h.s.  10.Glipizide extended release 5 mg a day.   PHYSICAL EXAMINATION:  VITAL SIGNS:  Her blood pressure was 140/66.  Her  heart rate is 66 and regular.  EKG shows normal sinus rhythm with ST-T  wave changes in I, aVL, V5 nd V6 which is unchanged.  She has poor R  wave progression across the anterior precordium which is unchanged.  GENERAL:  She looks younger than stated age.  She is extremely  Teacher, early years/pre.  Her weight is 132 pounds.  HEENT:  She wears glasses. PERRLA.  Extraocular movements intact.  Sclerae are clear.  Facial symmetry is normal.  NECK:  Supple. Carotids are slightly delayed.  She has carotid bruits  and referred sounds in both carotid areas.  Tyroid is not enlarged.  Trachea is midline.  There is no JVD.  LUNGS:  Clear.  HEART:  Reveals a normal S1, diminished S2.  S2 is difficult to hear  split.  She has a 3/6 aortic stenosis murmur.  ABDOMEN:  Soft.  Good bowel sounds.  No midline bruit.  No hepatomegaly.  EXTREMITIES:  Reveal no cyanosis, clubbing, or edema.  Pulses  are  present.  NEUROLOGIC:  Intact.   I have had a nice chat with [name].  We will arrange for her to have a  2D echo to follow up on her aortic stenosis.  We will plan on seeing her  back in 6 months.     Thomas C. Daleen Squibb, MD, [facility]  Electronically Signed    TCW/MedQ  DD: 07/30/2006  DT: 07/30/2006  Job #: [id number]   cc:   [name], MD

## 2010-05-20 NOTE — Assessment & Plan Note (Signed)
[facility] OFFICE NOTE   NAME:[name], [name]                         MRN:          [id number]  DATE:04/18/2007                            DOB:          1920-06-14    [name] comes in today for followup.  1. History of severe stenosis status post aortic valve replacement      January 2009.  2. Coronary disease status post coronary bypass grafting January 2009.  3. Chest wall soreness.  4. Phantom discomfort under her left breast.  This is getting better.  5. Hypertension.  6. Hyperlipidemia.   Other than some soreness in her chest [name] is doing well.  She is  not anxious to get back to working in her yard quite yet, because of the  soreness.  She would like to wait until the end of May or first of June.   Her medicines are unchanged since last visit.   Her blood pressure today was high initially over 170/70.  She gives me a  couple measures at home that were 200-212 systolic.  She has never been  quite that high here.  In fact, her last 2 pressures have been  normotensive.   Her current meds are clonidine 0.3 mg p.o. b.i.d.  She is supposed to be  on it t.i.d. but her prescription was changed for some reason.  Glipizide 5 mg p.o. b.i.d., metoprolol tartrate 50 mg b.i.d., Avapro 300  mg a day, enteric-coated aspirin 81 mg a day, hydrochlorothiazide 25 mg  a day, levothyroxine 75 mcg a day, Premarin 0.3 mg a day, Ditropan 5 mg  a day, Protonix 40 mg a day.   Her blood pressure is 170/70, pulse 60 and regular.  Her weight is 124.  She looks younger than stated age.  She is in a great mood, great sense  of humor as usual.  Alert and oriented x3.  HEENT:  Unchanged.  Carotids are full.  Thyroid is not enlarged.  Trachea is midline.  LUNGS:  Clear to auscultation.  Her chest reveals some tenderness over the mid sternal but is not  unstable.  HEART:  Reveals a normal S1-S2 soft systolic murmur.  No  diastolic  component.  No rub.  ABDOMEN:  Soft.  EXTREMITIES:  No edema.  Pulses are intact.   I rechecked her pressure with a large cuff in the left arm and it was  135-140 systolic.   PLAN:  1. Do not start regular yard work and other chores such as vacuuming,      et cetera until June 1.  2. I asked her bring her cuff to the office and we will calibrate it.      If she does not do that, would certainly ask her to get a larger      cuff at her drug store.  3. Increase her clonidine to 0.3 mg p.o. t.i.d.   I will see her back in August last of nine.     Thomas C. Daleen Squibb, MD, [facility]  Electronically Signed  TCW/MedQ  DD: 04/18/2007  DT: 04/18/2007  Job #: [id number]

## 2010-05-20 NOTE — Assessment & Plan Note (Signed)
[facility] OFFICE NOTE   NAME:[name], [name]                         MRN:          [id number]  DATE:02/24/2007                            DOB:          [birth year]    HISTORY:  [name] returns today for further management and close follow  up of her aortic valve disease, status post aortic valve replacement and  bypass grafting.   She has seen Dr. Cornelius Moras since I saw her on February 07, 2007.  Please see  his note from February 21, 2007.   She is progressively improving.  Her biggest concern is her blood  pressure drops at  points of time and she feels out of it.  She is on  a stronger diuretic postop than she was preop.  She is also on clonidine  3 times a day.  She actually wakes herself up in the middle of the night  to take her clonidine!   She denies any orthopnea, PND or peripheral edema.  She has got a little  chest wall soreness, but this is improving.  She has got a little  drainage from a saphenous vein graft harvest site that Dr. Cornelius Moras said was  okay.   CURRENT MEDICATIONS:  1. Clonidine 0.3 t.i.d.  2. Simvastatin 40 mg daily.  3. Potassium 10 mEq a day.  4. Glipizide 5 mg b.i.d.  5. Metoprolol tartrate 50 mg b.i.d.  6. Avapro 300 mg a day.  7. Pantoprazole 40 mg a day.  8. Torsemide 10 mg a day.  9. Enteric-coated aspirin 81 mg a day.   PHYSICAL EXAMINATION:  VITAL SIGNS:  Her blood pressure today is 128/64,  pulse is 78 and regular.  Her weight is 123.  Respiratory rate is 18 and  unlabored.  SKIN:  Color is improved.  Skin is warm and dry.  GENERAL:  She looks like her old self.  HEENT:  Normocephalic, atraumatic.  PERRL.  Extraocular movements are  intact.  Sclerae are clear.  NECK:  Carotid upstrokes were equal bilaterally with soft systolic  sounds bilaterally.  Thyroid is not enlarged.  Trachea is midline.  LUNGS:  Clear.  There are no decreased breath sounds in the bases as  before.  HEART:  Reveals a normal S1 and a S2.  S2 splits.  There is no diastolic  component.  There are no rubs, no gallop.  Sternotomy site is healing  nicely.  ABDOMEN:  Soft, good bowel sounds.  No hepatomegaly.  EXTREMITIES:  No edema.  Pulses are present.  NEURO:  Exam is intact.   ASSESSMENT/PLAN:  [name] is doing remarkably well.  I have decreased  her clonidine to 0.3 b.i.d.  Stop the potassium and her torsemide, and  put her back on HCTZ 25 mg a day.  I will see her back again in April.   I advised her she can drive.  I have also advised her that she should  not do any yard work until April to allow sternal healing.  We will plan  on seeing her  back at that time.     Thomas C. Daleen Squibb, MD, [facility]  Electronically Signed    TCW/MedQ  DD: 02/24/2007  DT: 02/25/2007  Job #: [id number]   cc:   Salvatore Decent. Cornelius Moras, M.D.  [name], MD

## 2010-05-20 NOTE — Assessment & Plan Note (Signed)
[facility] OFFICE NOTE   NAME:HOLTKauri, Garson                         MRN:          [id number]  DATE:01/17/2008                            DOB:          1920-04-16    Ms. Blansett comes in today.  She had been having some unsteady balance when  she stands still, but no other neurological complaints.  She says she  sometimes gets little numbness and tingling in her toes.   She saw Dr. Tillman Abide recently who found that her thyroid was a bit  low.  Her dose of levothyroxine has been changed.   She denies any symptoms of dyspnea on exertion, chest tightness,  syncope.  She is very active.  Denies any orthopnea, PND, or peripheral  edema.   Problem list is as outlined on my note April 18, 2007.   Recent carotid Doppler showed progression of her right internal carotid  artery stenosis, in particular being 60-79%.  She is having no symptoms  of TIAs and I have questioned her very carefully.  I have also educated  her about the symptoms of TIAs and how to react with 911 if they last  more than 10 minutes.  We will repeat her carotid Dopplers in 6 months.   Her meds today are unchanged except for she is no longer on Protonix and  she is now on levothyroxine 37 mcg, a half on Monday and Thursday.   PHYSICAL EXAMINATION:  VITAL SIGNS:  Her blood pressure today is 132/62,  her pulse is 59 and regular, her weight is 130.  HEENT:  Unremarkable.  NECK:  Carotids reveal bilateral bruits, right greater than left.  Thyroid is not enlarged or tender.  Trachea is midline.  LUNGS:  Clear to auscultation and percussion.  Sternotomy site is stable  with no tenderness.  HEART:  Normal S1, S2.  No diastolic murmur.  ABDOMEN:  Soft with bowel sounds.  No midline bruit.  No hepatomegaly.  EXTREMITIES:  No cyanosis, clubbing, or edema.  Pulses are intact.   EKG today shows sinus bradycardia with some ST-segment changes laterally  which are unchanged.  In fact, they may be better since her valve  replacement.   I had a long talk with Ms. Masini today.  Again, reviewed the symptoms of  TIAs and mini-strokes and how to respond appropriately.  We will repeat  her carotid Dopplers in June.  If she has progression at that time, we  will refer her for surgical consultation.   I have also talked to Dr. Tillman Abide today.  With a recent anemia  and symptoms in her legs with a positive Romberg sign, we will obtain a  B12 and folate just for completeness.  I will see her back in June.     Thomas C. Daleen Squibb, MD, [facility]  Electronically Signed    TCW/MedQ  DD: 01/17/2008  DT: 01/18/2008  Job #: [id number]

## 2010-05-20 NOTE — Procedures (Signed)
CAROTID DUPLEX EXAM   INDICATION:  Carotid artery disease.   HISTORY:  Diabetes:  Yes  Cardiac:  CABG  Hypertension:  Yes  Smoking:  No  Previous Surgery:  No carotid artery surgery  CV History:  Asymptomatic  Amaurosis Fugax No, Paresthesias No, Hemiparesis No                                       RIGHT             LEFT  Brachial systolic pressure:  Brachial Doppler waveforms:  Vertebral direction of flow:        antegrade  DUPLEX VELOCITIES (cm/sec)  CCA peak systolic                   112  ECA peak systolic                   113  ICA peak systolic                   446  ICA end diastolic                   105  PLAQUE MORPHOLOGY:                  calcific with shadowing  PLAQUE AMOUNT:                      severe  PLAQUE LOCATION:                    proximal ICA/bifurcation   IMPRESSION:  1. Repeat right-sided only limited study.  2. Right internal carotid artery shows evidence of essentially high-      end 60% to 79% stenosis.  3. Main area of stenosis appears to be the proximal 1.5 cm of internal      carotid artery with a segment of that obscured by acoustic      shadowing.   ___________________________________________  Janetta Hora. Fields, MD   AS/MEDQ  D:  02/06/2009  T:  02/07/2009  Job:  [id number]

## 2010-05-20 NOTE — Assessment & Plan Note (Signed)
[facility] OFFICE NOTE   NAME:HOLTHayli, Milligan                         MRN:          [id number]  DATE:02/07/2007                            DOB:          May 10, 1920    Ms. Colella returns today.  She is status post aortic valve replacement for  severe symptomatic aortic stenosis and coronary bypass surgery.  She had  a left internal mammary artery placed in the distal LAD, vein graft to  the distal right, vein graft to the third diagonal.   She did well post-op except for some confusion.  She has some  postoperative anemia.   Since discharge she has had problems with controlling her blood  pressure.  She actually came to the [facility] ED on the night of February 04, 2007.   Her hemoglobin was 10.8.  White count 11.5, platelets 172,000, sodium  138, potassium 4.0, creatinine 1.3.   We had purposely held her Avapro because her postoperative blood  pressure was normal to low.   She complains of some generalized soreness, bruising, particularly in  her left groin and leg, and also recurrence of her dermatitis of her  ankles.  She is out of her steroid cream.   She is currently on Protonix 40 mg a day, simvastatin 40 mg a day, stool  softener, clonidine 0.3 mg t.i.d. glipizide 5 mg p.o. b.i.d., metoprolol  25 mg p.o. b.i.d.   She is quite humorous as usual.  She is in no acute distress.  Respirations 18.  Alert and oriented x3.  Blood pressure 154/76, heart  rate is 83.  Her electrocardiogram shows sinus rhythm with LVH and  strain.  HEENT:  Normocephalic, atraumatic.  PERRL.  Extraocular movements  intact.  Sclerae are clear.  Facial symmetry is normal.  Carotids were equal bilateral bruits, no thyromegaly.  Trachea is  midline.  Lungs were clear except for some slight decreased breath sounds in the  left base.  Probably from the small effusion.  Sternotomy site is  healing nicely.  There is no instability.  PMI is  nondisplaced.  She has an S4.  There is no gallop.  She has a  soft systolic murmur.  There is no diastolic murmur.  ABDOMEN:  Soft, good bowel sounds.  She has bruising over her chest tube  sites which were all superficial.  Her left groin has a small hematoma that is not very tender.  She has  her saphenous vein graft harvest sites are healing appropriately.  There  is no sign of infection.  She has some nonspecific dermatitis of both  lower legs and ankles.  Pulses are brisk.  She has 1+ edema.  There is  no sign of DVT.  NEURO:  Exam is intact.   ASSESSMENT/PLAN:  Ms. Beadle is recovering from surgery quite nicely.  Her  blood pressure is high and she also has some lower extremity edema.  She  has nonspecific dermatitis.   PLAN:  1. Increase metoprolol to 50 mg p.o. b.i.d.  This was her preoperative  dose.  2. Restart Avapro 300 mg a day.  3. Restart hydrochlorothiazide 25 mg a day.  4. Continue other medications.  5. Discharge from skilled nursing this Saturday the 7th.  6. Triamcinolone cream t.i.d. to both ankles.   I will see her back in 2 weeks.     Thomas C. Daleen Squibb, MD, [facility]  Electronically Signed    TCW/MedQ  DD: 02/07/2007  DT: 02/07/2007  Job #: [id number]   cc:   [facility], MD  Salvatore Decent. Cornelius Moras, M.D.

## 2010-05-20 NOTE — Assessment & Plan Note (Signed)
[facility] OFFICE NOTE   NAME:HOLTBibiana, Gillean                         MRN:          [id number]  DATE:08/15/2007                            DOB:          April 23, 1920    [name] returns today for follow up.  She is now over 6 months out from  aortic valve replacement and bypass grafting January 2009.  She is still  having some of the phantom discomfort on her left breast.  It is getting  better.   She has enjoyed heart track.  She is having no symptoms of dyspnea on  exertion, presyncope or exertional chest pain.  She has not gotten back  into her yard routine yet which I have told her she can do.   Her blood pressure is still up and down, but it is better than has been.  She brings in some listings from [employer] and she is running about  130-150 systolic and diastolics are always good after exercise.  She  runs about 135-140.   She says her pressures are highest in the early morning.  I have asked  to switch her Avapro to night time.   Her meds are outlined in the chart.  She says she is not taking Protonix  any further.   Her blood pressure today is 142/60, pulse is 60 and regular, weighs 132.  HEENT, Unchanged.  Carotids are full with bilateral bruits.  Thyroid is  not enlarged.  Trachea is midline.  Lungs are clear.  Sternotomy site is  stable.  She has a normal S1 and S2.  No diastolic murmur.  Abdominal  exam is soft.  Extremities, no edema.  Pulses are intact.   [name] is doing well.  Again, I have asked to take  her Avapro at  night.  We will set her up for carotid Dopplers in December.  I will see  her back in January 2010, a year out from her surgery.     Thomas C. Daleen Squibb, MD, [facility]  Electronically Signed    TCW/MedQ  DD: 08/15/2007  DT: 08/16/2007  Job #: [id number]   cc:   [name], MD

## 2010-05-20 NOTE — H&P (Signed)
HISTORY AND PHYSICAL EXAMINATION   January 17, 2007   Re:  [name], [name]            DOB:  17-Nov-1920   DATE OF PLANNED HOSPITAL ADMISSION:  01/26/2007.   REASON FOR CONSULTATION:  Severe aortic stenosis and single-vessel  coronary artery disease.   HISTORY OF PRESENT ILLNESS:  The patient is an 77 year old widowed white  female from [place] with known history of aortic stenosis for which  she has been followed by Dr. Juanito Doom over the last few years.  She has  remained quite active physically, despite her advanced age and she lives  alone and remains entirely functionally independent.  Her past medical  history is notable for history of hypertension, hyperlipidemia, type 2  diabetes mellitus.  She describes occasional episodes of exertional  shortness of breath and chest tightness that probably first developed 2  of 3 years ago.  These symptoms have been relatively sporadic and mild,  but more recently, they have become somewhat more pronounced.  Her most  recent 2D echocardiogram was performed in 08/2006.  She was noted to  have severe aortic stenosis with normal left ventricular systolic  function.  Specifically, the peak and mean transvalvular gradients  across the aortic valve were 61 and 41 mmHg respectively, corresponding  to an estimated aortic valve area of 0.65 centimeters squared with a  peak velocity across the valve of 392 centimeters per second.  Ejection  fraction remained normal at 60-65%.  There was mild aortic  regurgitation.  No other significant abnormalities were noted.  On  recent follow up with Dr. Daleen Squibb, the patient reported worsening symptoms  of occasional episodes of severe substernal chest tightness and  shortness of breath.  She subsequently was scheduled for elective left  and right heart catheterization.  This was performed 01/12/2007 by Dr.  Gala Romney.  Findings at the time of catheterization confirmed severe  aortic stenosis  with aortic valve area estimated 0.34 centimeters  squared and a mean transvalvular gradient of 41 mmHg.  She was also  found to have significant single-vessel coronary artery disease  involving high grade stenosis of the left anterior descending coronary  artery.  Right heart pressure remained normal.  The patient has been  referred for possible elective surgical intervention.   REVIEW OF SYSTEMS:  GENERAL:  The patient reports normal appetite.  She  has not been gaining or losing weight recently. She is 5 foot 3 inches  tall and weighs approximately 130 pounds.  CARDIAC:  Notable for occasional episodes of exertional chest tightness  and shortness of breath.  The patient denies episodes of resting  shortness of breath, PND, orthopnea, palpitations or syncope.  She has  occasional, mild, bilateral lower extremity edema.  RESPIRATORY:  Notable for the absence of productive cough, hemoptysis,  wheezing  GASTROINTESTINAL:  Negative.  The patient reports no difficulty  swallowing.  She denies hematochezia, hematemesis, melena.  She has  undergone upper GI endoscopy with dilatation for lower esophageal  stricture in the distant past, but she has had no problems with  swallowing recently.  GENITOURINARY:  Notable for urinary frequency and nocturia.  The patient  has had some urinary infections in the past, but none recently.  She  denies hematuria.  PERIPHERAL VASCULAR:  Negative.  The patient denies symptoms suggestive  of claudication.  She has not had problems with previous lower extremity  venous insufficiency or thrombosis.  NEUROLOGIC:  Negative.  The patient denies  symptoms suggestive of  previous TIA or stroke.  The patient does have moderate cerebrovascular  disease and has been asymptomatic.  MUSCULOSKELETAL:  Notable for mild arthritis and arthralgias,  particularly afflicting the fingers of her hands.  She has undergone  bilateral knee replacement in the past, but she gets  around quite well  presently.  PSYCHIATRIC:  Negative.  HEENT:  Notable for some episodes of epistaxis that required nasal  packing in the past.   PAST MEDICAL HISTORY:  1. Aortic stenosis.  2. Hypertension.  3. Hyperlipidemia.  4. Type 2 diabetes mellitus.  5. Stress urinary incontinence.  6. Asymptomatic cerebrovascular disease.  7. Possible hypothyroidism (details unclear).   PAST SURGICAL HISTORY:  1. Bilateral total knee replacement 8 years ago.  2. Bladder resuspension.  3. Total abdominal hysterectomy.  4. Tonsillectomy.  5. Upper GI endoscopy with dilatation approximately 5 years ago.   FAMILY HISTORY:  Noncontributory.   SOCIAL HISTORY:  The patient is widowed and lives in [place].  She  has 1 grown son who is quite supportive.  She is accompanied by her  niece here to the office today.  She has a remove history of tobacco  use, but she quite smoking in 1945.  She denies any alcohol consumption.   CURRENT MEDICATIONS:  1. Metoprolol 50 mg twice daily.  2. Levothyroxine 75 mcg once daily.  3. Clonidine 0.3 mg 3x daily.  4. Premarin 0.3 mg once daily.  5. Hydrochlorothiazide 25 mg daily.  6. Aspirin 81 mg daily.  7. Avapro 300 mg daily.  8. Ditropan 5 mg daily.  9. Pravastatin 40 mg twice daily.  10.Glipizide ER 5 mg once daily.   DRUG ALLERGIES:  SULFA, MACROBID, CEPHALEXIN (DETAILS UNCLEAR).   PHYSICAL EXAMINATION:  The patient is a well-appearing female who  appears younger than stated age in no acute distress.  Blood pressure is  131/70, pulse 64 regular, oxygen saturation 98% on room air.  HEENT:  Unrevealing.  The neck is supple.  There is no cervical or  supraclavicular lymphadenopathy.  There is no jugular venous distention.  No carotid bruits are noted.  On auscultation of the chest, it is  notable for clear breath sounds which are symmetrical bilaterally.  No  wheezes or rhonchi are noted.  Cardiovascular exam reveals regular rate  and rhythm.   There is a grade 2-3/6 syst9lic murmur heard best along the  right upper sternal border.  No diastolic murmurs are noted.  The  abdomen is mildly obese, but soft and nontender.  There are no palpable  masses.  Bowel sounds are present.  The extremities are warm and  adequately perfused.  Femoral pulses are palpable, although slightly  diminished in both groins.  Distal pulses are not palpable at the ankle.  There is no sign of significant venous insufficiency.  Rectal and GU  exams are both deferred.  The skin is clean, dry, healthy, pink  throughout.  Neurologic examination is grossly nonfocal and symmetrical  throughout.   DIAGNOSTIC TESTS:  2D echocardiogram performed 08/10/2006 is reviewed.  This demonstrates severe aortic stenosis with normal left ventricular  systolic function and moderate left ventricular hypertrophy.  The aortic  valve is tricuspid.  The aortic valve leaflets are severely thickened,  somewhat calcified, and do not move well.  There is mild aortic  insufficiency.  The aortic annulus is relatively small size, with the  aortic root diameter less than 2 cm.   Cardiac catheterization per 01/12/2007 by  Dr. Gala Romney is reviewed.  This confirmed the presence of sever aortic stenosis bases upon direct  measurement of transvalvular gradient.  There is normal left ventricular  systolic function.  There is severe single-vessel coronary artery  disease with high grade, 80% calcified stenosis of the mid left anterior  descending artery, just after trifurcation with the second and third  diagonal branches.  The third diagonal branch is moderate size with 60-  70% proximal stenosis.  The second diagonal branch has 95% stenosis, but  this vessel is somewhat small.  There is no significant disease in the  left circumflex coronary artery.  There is some moderate (30-40%  stenosis) of the mid right coronary artery with right dominant coronary  circulation.  There is osteal right  renal artery stenosis of at least 60-  80%.  There is 75% osteal stenosis of the right common iliac artery.   IMPRESSION:  Severe aortic stenosis with single-vessel coronary artery  disease and progressive symptoms of exertional chest pain and shortness  of breath.  Although the patient is likely to at slightly increase risk  for perioperative complication due to her advanced age, she remains  remarkably active physically and I suspect that she will probably do  well with surgery.  The size of her aortic root is somewhat small, and  this may limit surgical options considerably, potentially even  necessitating need for aortic root replacement of aortic root  enlargement.   PLAN:  I have discussed matters at length with the patient and her niece  here in the office today.  Alternative treatment strategies have been  discussed.  Longterm prognosis with medical therapy has been reviewed.  She understands and accepts all potential associated risk of surgery  including, but not limited to, risk of death, stroke, myocardial  infarction, congestive heart failure, respiratory failure, pneumonia,  bleeding requiring transfusion, arrhythmia, heart block, or bradycardia  requiring permanent pacemaker, recurrent coronary artery disease, late  complications related to valve replacement.  She also understands that  given the fact that she lives alone, with her advanced age, she will  likely need some type of temporary placement in assisted living or  skilled nursing facility during her convalescence following surgery.  All of her questions have been addresses.  We plan to proceed with  surgery on Wednesday, 01/25/2006.  We will obtain some wrist films today  due to recently fall with some minor residual tenderness in her left  wrist, but overall, she does not appear to have sustained a significant  injury.   Salvatore Decent. Cornelius Moras, M.D.  Electronically Signed   CHO/MEDQ  D:  01/17/2007  T:  01/18/2007   Job:  [id number]   cc:   Thomas C. Daleen Squibb, MD, [facility]  [name], MD  Bevelyn Buckles. Bensimhon, MD

## 2010-05-20 NOTE — Assessment & Plan Note (Signed)
[facility] OFFICE NOTE   NAME:HOLTMilaya, Hora                         MRN:          [id number]  DATE:03/29/2007                            DOB:          30-Jul-1920    HISTORY:  Ms. Capshaw returns today as an add on for breast sensitivity on  the left lateral breast and somewhat on the right lateral breast.   She denies any angina or chest discomfort with exertion.  She has had  some incisional pain, but no more than what we would expect.  She denies  any discharge or drainage from her breast.  There has been no redness or  erythema.  She is anxious to get back out and do her yard work.  It will  be about another 4 weeks.   MEDICATIONS:  Her meds unchanged since her last visit as noted in my  last note.   REVIEW OF SYSTEMS:  She denies any orthopnea, PND or peripheral edema.  She has had no hemoptysis.   PHYSICAL EXAMINATION:  VITAL SIGNS:  Blood pressure today is 130/70, her  pulse is 62 and regular.  Her weight is 125.  HEENT:  Unchanged.  NECK:  Shows no lymphadenopathy.  Carotid upstrokes were equal  bilaterally.  Thyroid is not enlarged.  LUNGS:  Clear to auscultation.  There is no rub.  BREASTS:  Normal appearance.  There is no nipple discharge.  There is no  redness.  There is just slight sensitivity to touch on the lateral  surfaces of both breasts, left greater than right.  ABDOMEN:  Soft.  EXTREMITIES:  No edema.  Pulses are intact.  NEURO:  Intact.  No sign of DVT.   ASSESSMENT:  I suspect Ms. Mullane is having some postoperative pain or  increased sensitivity probably from her left internal mammary artery  being used for bypass.  The right side is more difficult to explain, but  is not quite as prominent as the left.  Reassurance has been given.  I  will see her back as scheduled in about 3 weeks.     Thomas C. Daleen Squibb, MD, [facility]  Electronically Signed    TCW/MedQ  DD: 03/29/2007  DT: 03/29/2007   Job #: [id number]

## 2010-05-20 NOTE — Assessment & Plan Note (Signed)
[facility] OFFICE NOTE   NAME:HOLTTiasia, Weberg                         MRN:          [id number]  DATE:08/25/2006                            DOB:          1920-09-04    Ms. Witting returns today to discuss the findings of her 2-D  echocardiogram.  She has now severe aortic stenosis that has progressed  since 2006.  Her mean gradient was 41 with mild aortic insufficiency.  She has normal left ventricular function, mild LVH.  She has had  extensive atheroma of the descending aorta.   She is totally asymptomatic when carefully quizzed today.  She has no  significant dyspnea on exertion, exertional angina, presyncope or  syncope.  She is amazingly active for her age and looks much younger  than 55.   We have had a very candid discussion today about aortic valve  replacement and the timing of such.  I will see her every 3 months per  her request.  She will let me know if she starts to have any symptoms  whatsoever.  This would be a reason for proceeding with catheterization  and possible aortic valve replacement.     Thomas C. Daleen Squibb, MD, [facility]  Electronically Signed    TCW/MedQ  DD: 08/25/2006  DT: 08/26/2006  Job #: [id number]   cc:   [name], MD  Lucious Groves, MD

## 2010-05-20 NOTE — Assessment & Plan Note (Signed)
OFFICE VISIT   [name], [name]  DOB:  1920-08-12                                        February 21, 2007  CHART #:  [id number]   HPI:  The patient is status post aortic valve replacement using a 21-mm  [employer] Mitroflow pericardial tissue valve, as well as status post  coronary artery bypass graft x3 using a left internal mammary artery to  distal left anterior descending coronary artery, saphenous vein graft to  distal right coronary artery, saphenous vein graft to 3rd diagonal  branch.  This was done by Dr. Cornelius Moras on January 26, 2007.  Patient's  postoperative course was pretty unremarkable.  She was discharged to  [facility].  She presents today for her postoperative  followup appointment.  According to patient, she was very unhappy with  [facility].  She left and went home on February 12, 2007.  Home health was  arranged, she has a home health nurse aid, as well as physical therapy  coming out to her home.  She states that since discharge from hospital,  she developed a salivary gland infection requiring p.o. antibiotics.  This has improved.  She was also sent to the emergency room from  [facility] for hypertension.  This has stabilized and patient was sent  back to [facility] following.  No admission required.  Since she has been  at home from [place], patient's been ambulating 3 to 4 times per day,  progressing well.  She denies any dyspnea.  She does have mild  incisional discomfort.  Also, states that she has mild drainage from her  left endovein harvest site at the knee.  Appetite is progressing slowly.  She has a followup appointment with Dr. Daleen Squibb this coming Thursday.   PHYSICAL EXAM:  Vital Signs:  Blood pressure is 185/96.  Pulse of 95.  Respirations of 18.  O2 sats 98%.  Respiratory:  Clear to auscultation  bilaterally.  Cardiac:  Regular rate and rhythm.  No murmurs, gallops,  or rubs noted.  Abdomen:  Bowel sounds x4.  Soft and  nontender.  Extremities:  Patient has mild bilateral peripheral edema noted.  Incisions:  Sternal incision clean, dry, and intact and healing well.  Patient's left lower leg knee endovein harvest site has mild bloody  drainage noted from it.   DIAGNOSTIC STUDIES:  Patient had a PA and lateral chest x-ray today,  February 16, showing no acute changes and clear.   IMPRESSION AND PLAN:  Patient is status post coronary artery bypass  grafting, as well as aortic valve replacement.  Patient is progressing  well.  She is told to keep her followup appointment with Dr. Daleen Squibb for  this coming Thursday.  Due to her blood pressure, we will increase her  Lopressor back to her preoperative dose at 50 mg b.i.[name].  Patient  instructed she is allowed to drive if feels well enough to drive.  She  is instructed no heavy lifting for the next 2 months.  She is to  continue ambulating 3 to 4 times per day and continue using her  breathing exercises.  Patient is to contact us with any surgical issue  she deems necessary.  We will plan to release patient from the office  and she is to follow up as needed.   Salvatore Decent. Cornelius Moras, M.[name].  Electronically Signed   KMD/MEDQ  [name]:  02/21/2007  T:  02/22/2007  Job:  [id number]   cc:   Salvatore Decent. Cornelius Moras, M.[name].  Thomas C. Wall, MD, [facility]

## 2010-05-21 LAB — BASIC METABOLIC PANEL
Calcium: 8.9 mg/dL (ref 8.4–10.5)
Chloride: 99 mEq/L (ref 96–112)
Creatinine, Ser: 1.33 mg/dL — ABNORMAL HIGH (ref 0.4–1.2)
GFR calc Af Amer: 46 mL/min — ABNORMAL LOW (ref >60)
GFR calc non Af Amer: 38 mL/min — ABNORMAL LOW (ref >60)

## 2010-05-21 LAB — GLUCOSE, CAPILLARY: Glucose-Capillary: 134 mg/dL — ABNORMAL HIGH (ref 70–99)

## 2010-05-21 LAB — CBC
MCH: 30.3 pg (ref 26.0–34.0)
Platelets: 369 10*3/uL (ref 150–400)
RBC: 2.67 MIL/uL — ABNORMAL LOW (ref 3.87–5.11)

## 2010-05-21 LAB — CULTURE, ROUTINE-ABSCESS

## 2010-05-22 LAB — GLUCOSE, CAPILLARY
Glucose-Capillary: 147 mg/dL — ABNORMAL HIGH (ref 70–99)
Glucose-Capillary: 229 mg/dL — ABNORMAL HIGH (ref 70–99)

## 2010-05-23 LAB — ANAEROBIC CULTURE

## 2010-05-23 LAB — CBC
HCT: 25 % — ABNORMAL LOW (ref 36.0–46.0)
MCV: 91.6 fL (ref 78.0–100.0)
Platelets: 440 10*3/uL — ABNORMAL HIGH (ref 150–400)
RBC: 2.73 MIL/uL — ABNORMAL LOW (ref 3.87–5.11)
WBC: 12.5 10*3/uL — ABNORMAL HIGH (ref 4.0–10.5)

## 2010-05-23 LAB — CROSSMATCH: Unit division: 0

## 2010-05-23 LAB — BASIC METABOLIC PANEL
Chloride: 100 mEq/L (ref 96–112)
GFR calc Af Amer: 43 mL/min — ABNORMAL LOW (ref >60)
GFR calc non Af Amer: 36 mL/min — ABNORMAL LOW (ref >60)
Potassium: 4.6 mEq/L (ref 3.5–5.1)
Sodium: 135 mEq/L (ref 135–145)

## 2010-05-23 LAB — DIFFERENTIAL
Basophils Relative: 1 % (ref 0–1)
Eosinophils Relative: 4 % (ref 0–5)
Lymphocytes Relative: 12 % (ref 12–46)
Neutrophils Relative %: 70 % (ref 43–77)

## 2010-05-23 LAB — HEMOGLOBIN A1C: Mean Plasma Glucose: 143 mg/dL — ABNORMAL HIGH (ref <117)

## 2010-05-23 LAB — GLUCOSE, CAPILLARY: Glucose-Capillary: 160 mg/dL — ABNORMAL HIGH (ref 70–99)

## 2010-05-23 NOTE — Consult Note (Signed)
NAMELATRIA, MCCARRON NO.:  [id number]   MEDICAL RECORD NO.:  [id number]          PATIENT TYPE:  OIB   LOCATION:  2891                         FACILITY:  [facility]   PHYSICIAN:  Lacy Duverney, M.D.     DATE OF BIRTH:  [birth year]   DATE OF CONSULTATION:  DATE OF DISCHARGE:  09/25/2003                                   CONSULTATION   ADDENDUM  (Possible omission on prior dictation)   The patient's name is [name], and this exam was performed on September 25, 2003.       SO/MEDQ  D:  09/28/2003  T:  09/29/2003  Job:  [id number]

## 2010-05-23 NOTE — Op Note (Signed)
NAMEMERLE, WHITEHORN                  ACCOUNT NO.:  [id number]   MEDICAL RECORD NO.:  [id number]          PATIENT TYPE:  OIB   LOCATION:  2891                         FACILITY:  [facility]   PHYSICIAN:  Salvadore Farber, M.D. LHCDATE OF BIRTH:  October 23, 1920   DATE OF PROCEDURE:  09/25/2003  DATE OF DISCHARGE:                                 OPERATIVE REPORT   PROCEDURE:  Selective bilateral carotid angiography.   INDICATIONS FOR PROCEDURE:  The patient is an 77 year old lady who was found  to have a carotid bruit.  This prompted ultrasound demonstrating right  carotid stenosis.  Velocities were 290/72.  There was mild packing in the  left bulb with minimal stenosis.  She has no symptoms concerning for stroke  or TIA.  Systolic velocity suggested stenosis of approximately 80%,  diastolic velocity was relatively low.  I therefore suggested diagnostic  angiography to clarify the severity of stenosis and guide further therapy  for this asymptomatic woman.   DESCRIPTION OF PROCEDURE:  Informed consent was obtained.  Under 1%  lidocaine local anesthesia, a 5 French sheath was placed in the right common  femoral artery using the modified Seldinger technique.  A pigtail catheter  was advanced to the ascending aorta.  Aortography was performed by power  injection.  This demonstrated a type 2 arch with normal origins of the great  vessels.  The innominate artery was then selected using a JR4 catheter.  Angiography was performed by hand injection.  The catheter was then advanced  over a Wooley wire into the proximal portion of the right common carotid.  Angiography of the head and neck was performed in multiple views by hand  injection.  Catheter was then removed over a wire and repositioned in the  left subclavian.  Angiography was performed by hand injection.  The catheter  was then removed over a wire and positioned in the left common carotid.  Angiography of the head and neck was again repeated in  multiple views by  hand injection.  The catheter was removed over a wire.  The arteriotomy site  was then closed using a 6 [place] Angioseal device.  Complete hemostasis was  obtained.  The patient tolerated the procedure well and was transferred to  the holding room in stable condition.   COMPLICATIONS:  None.   FINDINGS:  1.  Aortic arch:  Type 2 arch with normal origins of the great vessels.  No      evident atheroma.  2.  Innominate:  Angiographically normal.  3.  Right carotid:  Normal common and external carotids.  The internal      carotid has a 60% stenosis of the proximal vessel.  4.  Left carotid:  Normal common and external.  There is minimal plaque in      the internal without significant stenosis.  5.  Left subclavian:  Angiographically normal with relatively large      vertebral.  6.  Intracerebral portion of the examination to be interpreted separately by      neuroradiology.   IMPRESSION:  Only moderate asymptomatic  carotid stenosis.  Will continue  with conservative management and repeat ultrasound in six months.       WED/MEDQ  D:  09/25/2003  T:  09/26/2003  Job:  [id number]   cc:   Thomas C. Wall, M.D.   Joanna Hews, M.D.   Lucious Groves, M.D.  [address], [id number]  [place], [place] [address]

## 2010-05-23 NOTE — Consult Note (Signed)
[name], [name] NO.:  [id number]   MEDICAL RECORD NO.:  [id number]          PATIENT TYPE:  OIB   LOCATION:  2891                         FACILITY:  [facility]   PHYSICIAN:  Lacy Duverney, M.D.     DATE OF BIRTH:  [birth year]   DATE OF CONSULTATION:  09/28/2003  DATE OF DISCHARGE:  09/25/2003                                   CONSULTATION   REASON FOR CONSULTATION:  Request by Dr. Samule Ohm for interpretation of  intracranial views obtained on cerebral arteriogram.   Examination was performed by Dr. Samule Ohm on Sept. 20, 2005.   This is an 77 year old lady with asymptomatic right internal carotid artery  stenosis.   RIGHT COMMON CAROTID ARTERIOGRAM:  (AP and lateral intracranial views)  There is a slightly prominent infundibulum from which arises the posterior  communicating artery.  No evidence of significant stenosis.  Hypoplastic A1  segment right anterior cerebral artery.  No cross line left anterior  cerebral artery.   LEFT COMMON CAROTID ARTERY ARTERIOGRAM:  (AP and lateral intracranial views)   There is a 3 mm bulbous projection which may represent a prominent  infundibulum.  However, posterior communicating artery is not seen arising  from this structure and a posterior communicating artery aneurysm can  therefore not be excluded.  Dr. Samule Ohm has been contacted.  Mild  irregularity of the supraclinoid aspect of the left internal carotid artery  without significant stenosis identified.  There is slight cross filling of  right anterior cerebral artery branch vessels.   IMPRESSION:  1.  Prominent infundibulum on the right.  2.  On the left, there is a prominent infundibulum versus small posterior      communicating artery aneurysms.  A vessel originating from this bulbous      projection is not identified allowing one to diagnose this as an      infundibulum.  3.  Mild irregularity and slight narrowing of the supraclinoid aspect of the      left internal  carotid artery without significant stenosis identified.  4.  There is slight cross filling of right anterior cerebral artery branch      vessels with injection of the left common carotid artery.  No cross      filling occurs when the right common carotid artery is injected.       SO/MEDQ  D:  09/28/2003  T:  09/29/2003  Job:  [id number]

## 2010-05-23 NOTE — Assessment & Plan Note (Signed)
[facility]                              CARDIOLOGY OFFICE NOTE   NAME:[name], [name]                           MRN:          [id number]  DATE:08/05/2005                            DOB:          [birth year]   HISTORY OF PRESENT ILLNESS:  [name] returns today for management of the  following issues.   Please see the problem list on November 19, 2004.   Follow up carotids July 27, 2005, showed no significant progression of her  disease.  She has antegrade flow in both vertebrals.  She is asymptomatic.   A 2D echocardiogram December 04, 2004, showed moderate aortic stenosis which  is stable.  She is due a stress Myoview.  She is having no significant  angina.   Her blood pressure continues to be labile and is managed closely by Dr.  Alphonsus Sias.  Her lipids are now at goal on a combination of Zetia and Lescol.  We made a copy for her today.  She unfortunately lost her husband tragically  in January.  She had been married 61 years.  She has been very lonely and is  still somewhat bitter.  [place]   Medications are listed on the maintenance medication sheet.  There has been  on change except the addition of Zetia, and her Lescol is now 80 mg daily.   PHYSICAL EXAMINATION:  VITAL SIGNS:  Her blood pressure is 170/64, pulse 71  and regular, weight 132 down 5.  GENERAL:  She is in no acute distress  She looks younger than her stated  age.  HEENT:  Extraocular movements intact.  PERRLA.  Sclerae clear.  Facial  symmetry is normal.  Carotid are full with bilateral bruits.  Thyroid is not  enlarged.  Trachea is midline.  LUNGS:  Clear.  HEART:  Systolic murmur consistent with aortic stenosis.  S2 splits.  ABDOMEN:  Soft, good bowel sounds.  EXTREMITIES:  No edema.  Pulses are present.   STUDIES:  EKG shows normal sinus rhythm with LVH which is stable.   [name] seems to be doing well.  I have made no change in her  program.  Will obtain Adenosine Myoview at her convenience for objective assessment of  her coronary disease.  As  soon as this is stable, I will see her back in a year.  At that time, she  will need carotid Doppler's and a 2D echo.                               Thomas C. Daleen Squibb, MD, [facility]   TCW/MedQ  DD:  08/05/2005  DT:  08/05/2005  Job #:  [id number]   cc:   [name], MD

## 2010-05-24 LAB — CULTURE, BLOOD (ROUTINE X 2): Culture  Setup Time: 201205130020

## 2010-05-27 DIAGNOSIS — I10 Essential (primary) hypertension: Secondary | ICD-10-CM

## 2010-05-27 DIAGNOSIS — E119 Type 2 diabetes mellitus without complications: Secondary | ICD-10-CM

## 2010-05-27 DIAGNOSIS — L03119 Cellulitis of unspecified part of limb: Secondary | ICD-10-CM

## 2010-05-27 DIAGNOSIS — L02619 Cutaneous abscess of unspecified foot: Secondary | ICD-10-CM

## 2010-05-27 DIAGNOSIS — I2510 Atherosclerotic heart disease of native coronary artery without angina pectoris: Secondary | ICD-10-CM

## 2010-05-29 ENCOUNTER — Other Ambulatory Visit: Payer: Self-pay

## 2010-06-05 NOTE — Discharge Summary (Signed)
[name], [name]                  ACCOUNT NO.:  [id number]  MEDICAL RECORD NO.:  [id number]           PATIENT TYPE:  I  LOCATION:  5019                         FACILITY:  [facility]  PHYSICIAN:  Altha Harm, MDDATE OF BIRTH:  July 23, 1920  DATE OF ADMISSION:  05/17/2010 DATE OF DISCHARGE:  05/23/2010                              DISCHARGE SUMMARY   PRIMARY CARE PHYSICIAN:  [name], MD  DISCHARGE DISPOSITION:  Skilled nursing facility.  DISCHARGE DIAGNOSES: 1. Diabetic foot abscess. 2. Status post incision and draining of diabetic foot abscess. 3. Diabetes type 2, uncontrolled. 4. Constipation. 5. Hypothyroidism. 6. Anemia, stable. 7. Coronary artery disease. 8. Hyperlipidemia. 9. Hypertension.  DISCHARGE MEDICATIONS:  Include the following: 1. Tylenol 650 mg p.o. q.4 h p.r.n. pain. 2. Norvasc 5 mg p.o. daily. 3. Clonidine 0.1 mg p.o. t.i.d. 4. Doxycycline 100 mg p.o. b.i.d. x2 days. 5. Docusate 100 mg p.o. daily. 6. Iron sulfate 325 mg p.o. daily. 7. Hydrocodone/APAP 5/325 mg p.o. q.4 h p.r.n. pain. 8. Zofran 4 mg p.o. q.6 hours p.r.n. nausea. 9. MiraLax 17 grams in 8 ounces of water p.o. daily. 10.Artificial tears over-the-counter 1-2 drops in each eye daily at     bedtime as needed. 11.Aspirin enteric-coated 81 mg p.o. daily. 12.Enalapril 10 mg p.o. b.i.d. 13.Estrace 1 tablet p.o. daily. 14.Glipizide XL 5 mg p.o. b.i.d. 15.Hydrochlorothiazide 25 mg p.o. daily. 16.Levothyroxine 75 mcg p.o. daily. 17.Metoprolol 75 mg p.o. b.i.d. 18.Oxybutynin 5 mg p.o. daily. 19.Xanax 0.25 mg p.o. b.i.d. p.r.n. anxiety. 20.Zocor 40 mg p.o. daily.  CONSULTANTS: 1. Luis Abed, MD, [facility], [facility] 2. Burnard Bunting, MD, Orthopedics.  PROCEDURE:  Status post left foot irrigation and debridement of plantar and dorsal approach over the metatarsal-phalangeal joint.  DIAGNOSTIC STUDIES: 1. X-ray of the left foot which shows severe hallux valgus deformity   with soft tissue prominence at the first metatarsophalangeal joint.     No bony destruction evident. 2. MR of the left foot without contrast which shows cellulitis and     myofascitis.  No MR findings for osteomyelitis. 3. X-ray of the left wrist which shows no acute osseous abnormality.     Osteoarthritis, diffuse soft tissue swelling. 4. X-ray, two-view of the ankle which shows no acute osseous     abnormality, mild osteopenia, diffuse soft tissue swelling. 5. X-ray of the right foot which shows no acute osseous abnormality,     mild-to-moderate osteoarthritis.  Mild osteopenia, hallux valgus.  CODE STATUS:  Full code.  ALLERGIES: 1. SULFA. 2. NITROFURANTOIN. 3. CEPHALOSPORINS.  CHIEF COMPLAINT:  Left foot swelling.  HISTORY OF PRESENT ILLNESS:  Please refer to the H and P by Dr. Andreas Blower for details of the HPI, however in short, this is an 77 year old female with a history of coronary disease, hypertension, chronic kidney disease stage III, CVA in the past who was having swelling of her left foot.  The patient was being managed by Dr. Adolphus Birchwood, her dermatologist with redness in her left lower extremity.  This is being progressed to more inflamed condition and she was sent to the emergency room  for further evaluation and management.  In the emergency room, the patient was evaluated to have an infection of the foot and [facility] will call her for further evaluation and management.  HOSPITAL COURSE: 1. Abscess of the right foot.  The patient was found to have an     abscess and was taken to surgery for an incision and drainage.  The     patient's foot is irrigated both on dorsal and plantar approach..  She had been maintained on vancomycin for culture that yield MRSA.  The culture was found to be sensitive to tetracycline and doxycycline.  I spoke with Dr. Cliffton Asters of Infectious Diseases and has recommended that the patient be continued on doxycycline for an  additional 10 days since there is no bony involvement.  Please note that the MRI showed no evidence of osteomyelitis or bony involvement.  I spoke with Dr. August Saucer who had originally favored IV antibiotics; however, he was not aware that the MRSA was sensitive to doxycycline at that time.  He has no compelling reason for the patient to be placed on IV antibiotics as she is being sent out on oral doxycycline for an additional 10 days. 1. Diabetes type 2.  The patient's diabetes was moderately well     controlled with a hemoglobin A1c of 6.6.  She is continued on her     regimen of treatment. 2. Chronic kidney disease, stage III.  The patient is under the care     of nephrologist of .  She is on enalapril for renal protection      as well as for hypertension and will continue on that.  Her      creatinine at the time of discharge is 1.39, which is at her baseline. 3. Constipation.  The patient had some constipation while     hospitalized, which is likely a combination of lack of movement in     addition to narcotic medications and poor oral intake.  She should     be on bowel regimen of MiraLax, and will likely need enemas unless     her movement improves.  Otherwise, the patient is stable.  Her     thyroid appears to be well supplemented.  At the time of discharge the patient's condition is as follows: VITAL SIGNS:  Temperature is 98.3, heart rate 84, blood pressure 180/65, respiratory rate 16, O2 sats are 97% on room air. HEENT:  She is normocephalic, atraumatic.  Pupils equally round and reactive to light and accommodation.  Extraocular movements are intact. Oropharynx is moist.  No exudate, erythema, or lesions are noted. NECK:  Trachea is midline.  No masses, no thyromegaly, no JVD, no carotid bruit. RESPIRATORY:  The patient has a normal respiratory effort with equal excursion bilaterally.  No wheezing or rhonchi noted. CARDIOVASCULAR:  She has got a normal S1 and S2.  No murmurs,  rubs or gallops are noted.  PMI is nondisplaced.  No heaves or thrills on palpation. ABDOMEN:  Obese, soft, nontender, nondistended.  No masses.  No hepatosplenomegaly is noted. NEUROLOGICAL:  The patient has no focal neurological deficits. PSYCHIATRIC:  She is alert and oriented x3.  Good insight and cognition. Good recent remote recall.  DIETARY RESTRICTIONS:  The patient should be on diabetic heart-healthy diet.  PHYSICAL RESTRICTIONS:  The patient is weightbearing as tolerated in a cast shoe.  FOLLOWUP:  The patient is to follow up with Dr. August Saucer in the office in 10 days.  Phone number to Dr. Diamantina Providence office is [phone].  She is to follow up with a primary care physician, Dr. Alphonsus Sias in 3-5 days.  Total time for this discharge process including face-to-face time approximately 50 minutes.  Transferred to skilled facility.  The patient is medically stable for discharge.     Altha Harm, MD     MAM/MEDQ  D:  05/23/2010  T:  05/23/2010  Job:  [id number]  Electronically Signed by Marthann Schiller MD on 06/05/2010 09:24:21 AM

## 2010-06-08 ENCOUNTER — Inpatient Hospital Stay (HOSPITAL_COMMUNITY)
Admission: EM | Admit: 2010-06-08 | Discharge: 2010-06-13 | DRG: 392 | Disposition: A | Discharge status: Home Health | Payer: Medicare Other | Attending: Hospitalist | Admitting: Hospitalist

## 2010-06-08 ENCOUNTER — Emergency Department (HOSPITAL_COMMUNITY): Payer: Medicare Other

## 2010-06-08 DIAGNOSIS — E039 Hypothyroidism, unspecified: Secondary | ICD-10-CM | POA: Diagnosis present

## 2010-06-08 DIAGNOSIS — R5381 Other malaise: Secondary | ICD-10-CM | POA: Diagnosis present

## 2010-06-08 DIAGNOSIS — N179 Acute kidney failure, unspecified: Secondary | ICD-10-CM | POA: Diagnosis present

## 2010-06-08 DIAGNOSIS — I129 Hypertensive chronic kidney disease with stage 1 through stage 4 chronic kidney disease, or unspecified chronic kidney disease: Secondary | ICD-10-CM | POA: Diagnosis present

## 2010-06-08 DIAGNOSIS — N183 Chronic kidney disease, stage 3 unspecified: Secondary | ICD-10-CM | POA: Diagnosis present

## 2010-06-08 DIAGNOSIS — K5289 Other specified noninfective gastroenteritis and colitis: Principal | ICD-10-CM | POA: Diagnosis present

## 2010-06-08 DIAGNOSIS — E875 Hyperkalemia: Secondary | ICD-10-CM | POA: Diagnosis present

## 2010-06-08 DIAGNOSIS — R197 Diarrhea, unspecified: Secondary | ICD-10-CM | POA: Diagnosis present

## 2010-06-08 DIAGNOSIS — E119 Type 2 diabetes mellitus without complications: Secondary | ICD-10-CM | POA: Diagnosis present

## 2010-06-08 DIAGNOSIS — I2510 Atherosclerotic heart disease of native coronary artery without angina pectoris: Secondary | ICD-10-CM | POA: Diagnosis present

## 2010-06-08 DIAGNOSIS — Z951 Presence of aortocoronary bypass graft: Secondary | ICD-10-CM

## 2010-06-08 LAB — CBC
HCT: 33.2 % — ABNORMAL LOW (ref 36.0–46.0)
MCHC: 33.1 g/dL (ref 30.0–36.0)
Platelets: 238 10*3/uL (ref 150–400)
RDW: 13.3 % (ref 11.5–15.5)
WBC: 27.9 10*3/uL — ABNORMAL HIGH (ref 4.0–10.5)

## 2010-06-08 LAB — DIFFERENTIAL
Basophils Relative: 0 % (ref 0–1)
Eosinophils Absolute: 0 10*3/uL (ref 0.0–0.7)
Lymphocytes Relative: 3 % — ABNORMAL LOW (ref 12–46)
Monocytes Absolute: 0.8 10*3/uL (ref 0.1–1.0)
Neutrophils Relative %: 94 % — ABNORMAL HIGH (ref 43–77)

## 2010-06-08 LAB — PROTIME-INR
INR: 1.15 (ref 0.00–1.49)
Prothrombin Time: 14.9 seconds (ref 11.6–15.2)

## 2010-06-08 LAB — COMPREHENSIVE METABOLIC PANEL
Albumin: 2.9 g/dL — ABNORMAL LOW (ref 3.5–5.2)
BUN: 86 mg/dL — ABNORMAL HIGH (ref 6–23)
Calcium: 9.4 mg/dL (ref 8.4–10.5)
Creatinine, Ser: 4.04 mg/dL — ABNORMAL HIGH (ref 0.4–1.2)
Glucose, Bld: 232 mg/dL — ABNORMAL HIGH (ref 70–99)
Potassium: 5.8 mEq/L — ABNORMAL HIGH (ref 3.5–5.1)
Total Protein: 8.1 g/dL (ref 6.0–8.3)

## 2010-06-08 LAB — APTT: aPTT: 28 seconds (ref 24–37)

## 2010-06-09 ENCOUNTER — Telehealth: Payer: Self-pay | Admitting: Cardiology

## 2010-06-09 ENCOUNTER — Other Ambulatory Visit: Payer: Self-pay

## 2010-06-09 LAB — COMPREHENSIVE METABOLIC PANEL
Albumin: 2.4 g/dL — ABNORMAL LOW (ref 3.5–5.2)
BUN: 75 mg/dL — ABNORMAL HIGH (ref 6–23)
Calcium: 8.3 mg/dL — ABNORMAL LOW (ref 8.4–10.5)
Creatinine, Ser: 2.58 mg/dL — ABNORMAL HIGH (ref 0.4–1.2)
Glucose, Bld: 163 mg/dL — ABNORMAL HIGH (ref 70–99)
Potassium: 4.4 mEq/L (ref 3.5–5.1)
Total Protein: 6.5 g/dL (ref 6.0–8.3)

## 2010-06-09 LAB — TSH: TSH: 0.14 u[IU]/mL — ABNORMAL LOW (ref 0.350–4.500)

## 2010-06-09 LAB — GLUCOSE, CAPILLARY
Glucose-Capillary: 162 mg/dL — ABNORMAL HIGH (ref 70–99)
Glucose-Capillary: 138 mg/dL — ABNORMAL HIGH (ref 70–99)
Glucose-Capillary: 146 mg/dL — ABNORMAL HIGH (ref 70–99)
Glucose-Capillary: 135 mg/dL — ABNORMAL HIGH (ref 70–99)

## 2010-06-09 LAB — URINE MICROSCOPIC-ADD ON

## 2010-06-09 LAB — MAGNESIUM: Magnesium: 1.6 mg/dL (ref 1.5–2.5)

## 2010-06-09 LAB — BASIC METABOLIC PANEL
BUN: 81 mg/dL — ABNORMAL HIGH (ref 6–23)
Calcium: 8.1 mg/dL — ABNORMAL LOW (ref 8.4–10.5)
GFR calc non Af Amer: 14 mL/min — ABNORMAL LOW (ref >60)
Glucose, Bld: 194 mg/dL — ABNORMAL HIGH (ref 70–99)

## 2010-06-09 LAB — TYPE AND SCREEN
ABO/RH(D): O POS
Antibody Screen: NEGATIVE

## 2010-06-09 LAB — CBC
MCH: 30.3 pg (ref 26.0–34.0)
MCHC: 33.6 g/dL (ref 30.0–36.0)
MCV: 90.2 fL (ref 78.0–100.0)
Platelets: 213 10*3/uL (ref 150–400)
RDW: 13.4 % (ref 11.5–15.5)
WBC: 23.8 10*3/uL — ABNORMAL HIGH (ref 4.0–10.5)

## 2010-06-09 LAB — URINALYSIS, ROUTINE W REFLEX MICROSCOPIC
Bilirubin Urine: NEGATIVE
Glucose, UA: NEGATIVE mg/dL
Specific Gravity, Urine: 1.019 (ref 1.005–1.030)
Urobilinogen, UA: 0.2 mg/dL (ref 0.0–1.0)

## 2010-06-09 LAB — CREATININE, URINE, RANDOM: Creatinine, Urine: 65.21 mg/dL

## 2010-06-09 NOTE — Telephone Encounter (Signed)
Pt asked Dr. Delsa Grana  to inform dr wall pt is in the [place].

## 2010-06-09 NOTE — Telephone Encounter (Signed)
I will forward to Dr. Daleen Squibb that pt is in rm [phone]. Mylo Red RN

## 2010-06-10 ENCOUNTER — Inpatient Hospital Stay (HOSPITAL_COMMUNITY): Payer: Medicare Other

## 2010-06-10 LAB — DIFFERENTIAL
Eosinophils Absolute: 0.1 10*3/uL (ref 0.0–0.7)
Lymphs Abs: 0.8 10*3/uL (ref 0.7–4.0)
Monocytes Relative: 8 % (ref 3–12)
Neutro Abs: 14.6 10*3/uL — ABNORMAL HIGH (ref 1.7–7.7)
Neutrophils Relative %: 87 % — ABNORMAL HIGH (ref 43–77)

## 2010-06-10 LAB — URINE CULTURE
Culture  Setup Time: 201206040600
Culture: NO GROWTH

## 2010-06-10 LAB — COMPREHENSIVE METABOLIC PANEL
AST: 16 U/L (ref 0–37)
Albumin: 2.2 g/dL — ABNORMAL LOW (ref 3.5–5.2)
Alkaline Phosphatase: 108 U/L (ref 39–117)
BUN: 52 mg/dL — ABNORMAL HIGH (ref 6–23)
CO2: 18 mEq/L — ABNORMAL LOW (ref 19–32)
Chloride: 106 mEq/L (ref 96–112)
Creatinine, Ser: 1.62 mg/dL — ABNORMAL HIGH (ref 0.4–1.2)
GFR calc Af Amer: 36 mL/min — ABNORMAL LOW (ref >60)
GFR calc non Af Amer: 30 mL/min — ABNORMAL LOW (ref >60)
Potassium: 3.6 mEq/L (ref 3.5–5.1)
Total Bilirubin: 0.8 mg/dL (ref 0.3–1.2)

## 2010-06-10 LAB — GLUCOSE, CAPILLARY: Glucose-Capillary: 119 mg/dL — ABNORMAL HIGH (ref 70–99)

## 2010-06-10 LAB — CBC
Hemoglobin: 9.5 g/dL — ABNORMAL LOW (ref 12.0–15.0)
MCH: 30.6 pg (ref 26.0–34.0)
MCV: 91 fL (ref 78.0–100.0)
Platelets: 178 10*3/uL (ref 150–400)
RBC: 3.1 MIL/uL — ABNORMAL LOW (ref 3.87–5.11)
WBC: 16.9 10*3/uL — ABNORMAL HIGH (ref 4.0–10.5)

## 2010-06-10 LAB — GIARDIA/CRYPTOSPORIDIUM SCREEN(EIA): Cryptosporidium Screen (EIA): NEGATIVE

## 2010-06-11 LAB — GLUCOSE, CAPILLARY
Glucose-Capillary: 116 mg/dL — ABNORMAL HIGH (ref 70–99)
Glucose-Capillary: 120 mg/dL — ABNORMAL HIGH (ref 70–99)
Glucose-Capillary: 156 mg/dL — ABNORMAL HIGH (ref 70–99)
Glucose-Capillary: 77 mg/dL (ref 70–99)
Glucose-Capillary: 88 mg/dL (ref 70–99)

## 2010-06-11 LAB — DIFFERENTIAL
Lymphocytes Relative: 9 % — ABNORMAL LOW (ref 12–46)
Lymphs Abs: 1.3 10*3/uL (ref 0.7–4.0)
Monocytes Relative: 8 % (ref 3–12)
Neutrophils Relative %: 80 % — ABNORMAL HIGH (ref 43–77)

## 2010-06-11 LAB — CBC
HCT: 26.2 % — ABNORMAL LOW (ref 36.0–46.0)
Hemoglobin: 8.8 g/dL — ABNORMAL LOW (ref 12.0–15.0)
MCH: 30.6 pg (ref 26.0–34.0)
MCV: 91 fL (ref 78.0–100.0)
RBC: 2.88 MIL/uL — ABNORMAL LOW (ref 3.87–5.11)
WBC: 14.7 10*3/uL — ABNORMAL HIGH (ref 4.0–10.5)

## 2010-06-11 LAB — RENAL FUNCTION PANEL
Albumin: 1.9 g/dL — ABNORMAL LOW (ref 3.5–5.2)
BUN: 35 mg/dL — ABNORMAL HIGH (ref 6–23)
CO2: 22 mEq/L (ref 19–32)
Chloride: 106 mEq/L (ref 96–112)
Creatinine, Ser: 1.35 mg/dL — ABNORMAL HIGH (ref 0.4–1.2)
Glucose, Bld: 120 mg/dL — ABNORMAL HIGH (ref 70–99)
Potassium: 3.2 mEq/L — ABNORMAL LOW (ref 3.5–5.1)

## 2010-06-11 LAB — T4, FREE: Free T4: 0.99 ng/dL (ref 0.80–1.80)

## 2010-06-12 LAB — GLUCOSE, CAPILLARY
Glucose-Capillary: 97 mg/dL (ref 70–99)
Glucose-Capillary: 133 mg/dL — ABNORMAL HIGH (ref 70–99)

## 2010-06-12 LAB — BASIC METABOLIC PANEL
BUN: 22 mg/dL (ref 6–23)
CO2: 22 mEq/L (ref 19–32)
Chloride: 108 mEq/L (ref 96–112)
Potassium: 5.2 mEq/L — ABNORMAL HIGH (ref 3.5–5.1)

## 2010-06-12 LAB — STOOL CULTURE

## 2010-06-13 LAB — GLUCOSE, CAPILLARY
Glucose-Capillary: 216 mg/dL — ABNORMAL HIGH (ref 70–99)
Glucose-Capillary: 132 mg/dL — ABNORMAL HIGH (ref 70–99)
Glucose-Capillary: 92 mg/dL (ref 70–99)

## 2010-06-13 LAB — BASIC METABOLIC PANEL
BUN: 14 mg/dL (ref 6–23)
Creatinine, Ser: 1.02 mg/dL (ref 0.4–1.2)
GFR calc non Af Amer: 51 mL/min — ABNORMAL LOW (ref >60)
Potassium: 3.8 mEq/L (ref 3.5–5.1)

## 2010-06-13 NOTE — Discharge Summary (Signed)
[name], [name]                  ACCOUNT NO.:  [id number]  MEDICAL RECORD NO.:  [id number]  LOCATION:  6733                         FACILITY:  [facility]  PHYSICIAN:  Sundra Aland, MD      DATE OF BIRTH:  [birth year]  DATE OF ADMISSION:  06/08/2010 DATE OF DISCHARGE:  06/12/2010                              DISCHARGE SUMMARY   DISCHARGE DISPOSITION:  Home.  DISCHARGE DIAGNOSES: 1. Clostridium difficile-mediated diarrhea. 2. Hyperkalemia which has been corrected. 3. Acute renal failure which has resolved with creatinine back to     baseline. 4. Deconditioning. 5. Hypertension. 6. Hypothyroidism. 7. Diabetes mellitus, type 2. 8. History of constipation, however, the patient is on MiraLAX. 9. Recent rehab after left foot abscess, incision and drainage and     debridement.  DISCHARGE MEDICATIONS:  The patient will continue on all her medication prior to admission except for the MiraLAX that has been discontinued. New medication will be Imodium 2 mg p.o. q.6 h. p.r.n. loose BM.  Levaquin 500 mg p.o. daily for 1 week and Flagyl 500 mg p.o. t.i.d. for 1 week.  PROCEDURES: 1. Abdominal x-ray 2 views on June 10, 2010, showed radial contrast     within the colon and no significant small bowel dilatation seen.     There are a few air-fluid levels within the bowel __________     examination consistent with increased fluid level within bowel,     again no pneumoperitoneum is evident. 2. CT abdomen and pelvis on June 09, 2010, showed inflammatory     stranding around the distended sigmoid colon with initial     thickening of the wall of the distal ileum.  Changes likely     represent infectious colitis or chronic disease.  Distal esophageal     wall thickening likely due to reflux esophagitis.  DISCHARGE FOLLOWUP:  The patient will need outpatient GI followup for colonoscopy.  HOSPITAL COURSE:  [name] is an 77 year old Caucasian female with past medical history significant for  diabetes mellitus type 2 with a diabetic foot ulcer status post recent incision and drainage of the abscess.  She also has a hypothyroidism, coronary artery disease as well as hyperlipidemia and hypertension who presented on June 08, 2010, with nausea, vomiting, as well as diarrhea.  Because, she has recently been in the hospital and been on antibiotics, C. diff was suspected and the patient had extensive stool studies which were negative.  She was managed supportively with antiemetics as well as IV fluids, and analgesics and she had made a remarkable improvement.  Her potassium was low and it has been corrected.  In particular, her BUN and creatinine were markedly elevated.  Before she was discharged back in May 2012, her BUN was 30 and creatinine was 1.39 and at the time she was admitted, BUN was 86 and creatinine was 4.04.  For resolution of her symptoms, supportive care included IV hydration.  Her renal function is back to baseline with BUN of 35 and creatinine of 1.09.  Her diarrhea has mostly resolved.  Her vital signs are also stable, blood pressure today is slightly elevated, heart rate is 84, respirations are  20, temperature is 98.2, satting 99% on room air.  I have increased her Norvasc from 5 to 10 mg while in the hospital, but most likely secondary to pain.  The patient was seen in consult by __________ including PT and OT.  She was able to ambulate 120 feet with a rolling walker.  They recommended home health PT followup as well as home health RN and home health OT.  She was able to be discharged in stable clinical condition.  DISCHARGE DIET:  Carbohydrate-modified.  DISCHARGE ACTIVITY:  The patient will continue with her home health PT.  DISCHARGE FOLLOWUP:  The patient will follow up with primary care physician, Dr. Tillman Abide.  When the present GI symptoms subside, recommend outpatient GI followup.     Sundra Aland, MD     LA/MEDQ  D:  06/12/2010  T:   06/12/2010  Job:  [id number]  cc:   [name], MD  Electronically Signed by Sundra Aland MD on 06/13/2010 02:57:24 PM

## 2010-06-14 NOTE — Discharge Summary (Signed)
  [name], [name]                  ACCOUNT NO.:  [id number]  MEDICAL RECORD NO.:  [id number]  LOCATION:  6733                         FACILITY:  [facility]  PHYSICIAN:  Sundra Aland, MD      DATE OF BIRTH:  1920/03/15  DATE OF ADMISSION:  06/08/2010 DATE OF DISCHARGE:  06/13/2010                              DISCHARGE SUMMARY   ADDENDUM  DISCHARGE DIAGNOSIS:  Clostridium difficile negative diarrhea.  [name]'s discharge was held yesterday because when physical therapy went to work with her, they felt she has quite a bit of diarrhea.  We have started her on Imodium since yesterday until this morning.  She has not had another episode of diarrhea.  Even this morning when she asked for a bedpan, she only passed urine.  Her vital signs this morning are stable except for blood pressure just a little bit up for which the nurse is about to give some hydralazine and p.o. meds.  Earlier this morning, it was 170/67 around 6 o'clock, heart rate is 92, respirations 24, temperature 98.3, saturating 93% on room air. She will, therefore, be discharged in stable clinical condition.     Sundra Aland, MD     LA/MEDQ  D:  06/13/2010  T:  06/14/2010  Job:  [id number]  Electronically Signed by Sundra Aland MD on 06/14/2010 01:26:35 PM

## 2010-06-15 LAB — CULTURE, BLOOD (ROUTINE X 2)
Culture  Setup Time: 201206041445
Culture: NO GROWTH

## 2010-06-17 ENCOUNTER — Telehealth: Payer: Self-pay | Admitting: *Deleted

## 2010-06-17 NOTE — Telephone Encounter (Signed)
Unable to reach nurse but patients family advised

## 2010-06-17 NOTE — Telephone Encounter (Signed)
Hold glipizide. Continue to follow fasting blood sugars and 2 hour postprandials. Call if fasting >120 or 2 hours above 200.

## 2010-06-17 NOTE — Telephone Encounter (Signed)
Home health nurse states pt is home from the hospital, after being hospitalized twice in the last month- last time for colitis and c-diff, and she is concerned that pt's blood sugar is running too low.  It runs 35-48 in the mornings fasting and 70-80 after eating.  She lives by herself.  Taking glipizide.  She is asking if she should quit taking this.  Please advise.  Dr Alphonsus Sias is out of the office.

## 2010-06-18 NOTE — Telephone Encounter (Signed)
I do not see any DM meds on her list- correct me if I am missing something  If her sugar is below 70- give her some juice immediately and then a meal - and keep watching sugars frequently until it goes up If she is on DM med let me know Keep follow up with Dr Alphonsus Sias  Keep [place] updated

## 2010-06-18 NOTE — Op Note (Signed)
  NAMEADAMARIS, [name] NO.:  [id number]  MEDICAL RECORD NO.:  [id number]           PATIENT TYPE:  LOCATION:                                 FACILITY:  PHYSICIAN:  Burnard Bunting, M.D.    DATE OF BIRTH:  Jul 29, 1920  DATE OF PROCEDURE: DATE OF DISCHARGE:                              OPERATIVE REPORT   PREOPERATIVE DIAGNOSIS:  Left foot infection.  POSTOPERATIVE DIAGNOSIS:  Left foot infection.  PROCEDURE:  Left foot irrigation and debridement with both plantar and dorsal approach over the metatarsophalangeal joint.  CULTURES OBTAINED:  X2.  INDICATIONS:  [name] is an 77 year old female with diabetes who presents for operative management of left foot infection after explanation of risks and benefits.  MRI scan does show phlegmon plantar, fluid collection dorsal to the MTP joint.  OPERATIVE FINDINGS:  The patient did have phlegmon plantar to the capsule over the MTP joint.  This phlegmon connected around the medial aspect of the MTP joint to another sinus tract which measured about 1 cm x 3 mm.  There was also a fluid collection over the MTP joint which was infected.  The phlegmon area was cultured and the fluid over the MTP joint was also cultured that appeared to be infected and likely accounting for her worsening clinical appearance of the foot between when I saw her this morning and tonight.  PROCEDURE IN DETAIL:  The patient was brought to the operating room where block and MAC anesthesia were induced.  The patient's time-out was called.  Left foot was prescribed with Hibiclens and saline and draped in a sterile manner.  Ankle Esmarch was utilized without exsanguination. The plantar sinus tract was opened and incised through about 3-cm incision.  Phlegmon tissue was sharply debrided along with devitalized skin and subcutaneous tissue, muscle, and fascia.  The capsule over the MTP joint was intact and the phlegmon was debrided.  This area then  was tracked medially around the MTP joint through a sinus tract which was also debrided sharply with a curette knife.  An incision was then made on the dorsal aspect of the MTP joint.  Skin and subcutaneous tissue were sharply divided.  Extensor tendon was mobilized laterally.  Fluid collection over the joint was encountered.  This fluid collection was sent for culture.  Then, following thorough debridement, 2 liters of irrigating solution were irrigated through each incision.  The ankle Esmarch was released.  Bleeding points were encountered and controlled with electrocautery.  Iodosorb drains were placed through each incision and bundled with a nylon tie.  Bulky dressing was then applied.  The patient tolerated the procedure well without immediate complications.     Burnard Bunting, M.D.     GSD/MEDQ  D:  05/18/2010  T:  05/19/2010  Job:  [id number]  Electronically Signed by [name] Agar.  [name] M.D. on 06/18/2010 02:09:20 PM

## 2010-06-18 NOTE — Telephone Encounter (Signed)
Spoke with patients's son and he stated that pt is getting weak and blood sugar has been dropping, today it was 39, per son pt get's "loopy" when her blood sugar drops, and also pt has been falling, per son what can he give pt when her blood sugar drops that low? And if pt goes into a coma will it kill her per son? I have scheduled and appt with Dr.Letvak on Monday 06/23/10 @ 2:00pm. I advised son that we have on-call physicians and if needed take pt back to ER. I told him since Dr.Letvak is out I would ask another physician what can be done. Please advise.

## 2010-06-18 NOTE — Telephone Encounter (Signed)
Dr Milinda Antis under med history it looks like pt was taking Glipizide 5mg  one tab twice a day. It looks like from note Dr Ermalene Searing advised to hold Glipizide. Are the instructions the same to call the family?

## 2010-06-18 NOTE — Telephone Encounter (Signed)
Pt's son Billey Gosling notified as instructed by telephone.Dr Milinda Antis said OK about medicine and instructions remain the same. Also Dr Milinda Antis said pt does need someone with patient all the time until this is resolved. Pt's son wanted to know if this could cause death. Dr Milinda Antis said if [facility] gets low enough yes it could cause death. Pt's son thanked me and will call back if problem before sees Dr Alphonsus Sias.

## 2010-06-18 NOTE — Consult Note (Signed)
NAMELULUBELLE, SIMCOE                  ACCOUNT NO.:  [id number]  MEDICAL RECORD NO.:  [id number]           PATIENT TYPE:  I  LOCATION:  3738                         FACILITY:  [facility]  PHYSICIAN:  Burnard Bunting, M.D.    DATE OF BIRTH:  [birth year]  DATE OF CONSULTATION: DATE OF DISCHARGE:                                CONSULTATION   REQUESTING PHYSICIAN:  Dr. Betti Cruz.  CHIEF COMPLAINT:  Left foot pain and swelling.  HISTORY OF PRESENT ILLNESS:  [name] is an 77 year old patient ambulatory who describes a several-day history of left foot pain and swelling.  She was working in a garden and she stepped on some rocks with her barefoot.  She had a puncture wound on the plantar aspect over the MTP joint #.  Subsequently developed fever, chills, swelling and erythema around the left foot region.  She presents now for evaluation.  PAST MEDICAL HISTORY:  Notable for hypertension, chronic kidney disease, anemia, type 2 diabetes, history of coronary artery disease status post CABG, hyperlipidemia, hypothyroidism, valve replacement, history of aortic stenosis, history of stress urinary incontinence, bilateral total knee replacements done in [place].  The patient lives by herself. Does not smoke, quit smoking in 1953, smoked last one in twice a week. Denies any illegal drug use or substance use.  She is here with her son who is present for discussion of all operative plans.  The patient is allergic to CEPHALOSPORINS, MACROBID and sulfa.  MEDICATIONS:  Currently, the patient is on artificial tears, aspirin 81 daily, metoprolol, glipizide, Xanax, Estrace, oxybutynin, clonidine, Levothroid, enalapril, Zocor along with the antibiotics of Cipro and vancomycin.  On exam; she is well-developed, well-nourished, no acute distress, alert and oriented.  Normal volume mass index.  Alert and oriented. Extraocular movements intact.  T-max in the hospital 101.9, blood pressure 166/51, heart rate 94,  respirations 20, O2 sat 97% on room air. Her pupils are equal, round, and reactive.  Neck is supple.  Bilateral upper extremities have good range of motion wrists, elbows, shoulders. Bilateral lower extremities on the left demonstrates palpable DP pulse. There is erythema, redness, two draining sinuses, one on the medial aspect of the MTP joint, one in the plantar aspect of the MTP joint.  No definite areas of fluctuance are noted, although the plantar wound is draining.  There is no tissue crepitus in the foot or lower leg region. Compartments are soft.  Laboratory values include white count 17.6, hemoglobin 9.9, platelet count 298.  Sodium 134, BUN 36, creatinine 2.6.  Lactic acid 1.5.  RADIOGRAPHY:  Plain x-rays of the foot, no osteomyelitis.  MRI scan also has been obtained at the time of this dictation.  It does show phlegmon plans and fluid collection in the dorsal to the MTP joint.  IMPRESSION:  Left foot infection in diabetic patient with multiple medical comorbidities which complicate medical decision-making.  This is a potential limb threatening infection for this patient.  She will need to undergo possible repeat I and D along with antibiotic coverage and will obtain cultures at the time of surgery.  At this time  it does not appear that involved the bone but that is a possibility that because of the short duration, it has not yet infected the bone.  PLAN:  At this time is for soft tissue work only unless there is bony destruction which is not present on MRI scan.  The patient understands the risks and benefits.  We will proceed.  All questions answered.     Burnard Bunting, M.D.     GSD/MEDQ  D:  05/18/2010  T:  05/19/2010  Job:  [id number]  Electronically Signed by Reece Agar.  [name] M.D. on 06/18/2010 02:09:18 PM

## 2010-06-18 NOTE — Telephone Encounter (Signed)
Agree to stay off the glipizide Make sure she has an appt within the next 2 weeks

## 2010-06-19 ENCOUNTER — Encounter: Payer: Self-pay | Admitting: Family Medicine

## 2010-06-19 ENCOUNTER — Telehealth: Payer: Self-pay | Admitting: *Deleted

## 2010-06-19 ENCOUNTER — Telehealth: Payer: Self-pay | Admitting: Family Medicine

## 2010-06-19 ENCOUNTER — Ambulatory Visit (INDEPENDENT_AMBULATORY_CARE_PROVIDER_SITE_OTHER): Payer: Medicare Other | Admitting: Family Medicine

## 2010-06-19 ENCOUNTER — Encounter: Payer: Self-pay | Admitting: Internal Medicine

## 2010-06-19 VITALS — BP 122/64 | HR 72 | Temp 97.7°F | Wt 115.1 lb

## 2010-06-19 DIAGNOSIS — E162 Hypoglycemia, unspecified: Secondary | ICD-10-CM

## 2010-06-19 NOTE — Telephone Encounter (Signed)
Can we call with an update tomorrow on sugars?  And see if someone was able to stay with her overnight which I recommend yesterday.  If hypoglycemia improved, may wait until Monday for f/u with Dr Alphonsus Sias.  If not improved, let me know.

## 2010-06-19 NOTE — Telephone Encounter (Signed)
Note routed to Dr Alphonsus Sias as instructed.

## 2010-06-19 NOTE — Telephone Encounter (Signed)
Thanks- please route to Dr Alphonsus Sias so he can review when he returns

## 2010-06-19 NOTE — Assessment & Plan Note (Addendum)
After diarrheal illness associated with ARF now resolved. Not on antihyperglycemics currently. From discussion seems to have poor diet, again emphasized importance of eating 3 meals a day, try to have healthy snacks in between meal. Pt and caregiver express agreement with and understanding of this plan. If low cbgs not improved, update [place]. Pt to f/u with PCP on Monday.  I think she may benefit from more comprehensive care than intermittent care with caregiver. Advised ideal to have someone stay with her 24/7.   Attribute episode she had this am to hypoglycemia itself.  nonfocal neuro exam.  Discussed healthy diet.

## 2010-06-19 NOTE — Telephone Encounter (Signed)
Patient notified as instructed by telephone. Pt's son has someone staying with mother now and will call back if not able to keep appt with Dr Sharen Hones this afternoon.

## 2010-06-19 NOTE — Telephone Encounter (Signed)
Left vm for pt's son to call back.

## 2010-06-19 NOTE — Progress Notes (Signed)
  Subjective:    Patient ID: [name], female    DOB: 11-23-1920, 77 y.o.   MRN: [id number]  HPI CC: hypoglycemia  Complicated 77yo new to me with h/o CAD s/p CABG, carotid stenosis s/o CEA 02/2009, DM, HTN, HLD, hypothyroid, CRI, h/o AS, and bilat TKA.  [name] presents with caregiver.  Actually patient and caregiver unsure why they are here today, states she's feeling fine, somewhat rushed because need to be at orthopedist at 4pm today for appointment.  Reviewed recent telephone notes and last D/C summary.  Hospitalized 05/2010 with L foot abscess s/p I&D in OR, then hospitalized again 6/3-08/2010 with diarrhea neg for c diff infection s/p treatment with levaquin and flagyl, as well as ARF with Cr to 4, resolved with IVF rehydration.  Rec f/u with GI.  Reviewed cbgs from hospitalization, no lows.  Lowest fasting 77.  Having episodes of hypoglycemia off antihyperglycemics.  Glipizide stopped.  Yesterday am sugar again dropped to 30, then after OJ and banana went up to 122.  During this time had episode where pt states she couldn't move, felt weak and shakey.  Denies LOC, HA, CP/tightness, slurred speech, unilateral weakness, confusion.  Also endorses has had fall.  Currently has HHPT   Pt currently alone at night times.  Caregiver returns in am to check on her.  Physical therapist comes to home to see her as well.  Pt thinks should be back at [facility], doesn't want to go back to [facility].  Discussing meal regimen, seems like she doesn't have good routine in place for meals.  States ate oatmeal this morning.  Nothing yesterday morning when hypoglycemic.    Review of Systems Per HPI    Objective:   Physical Exam  Nursing note and vitals reviewed. Constitutional: She is oriented to person, place, and time. She appears well-developed and well-nourished. No distress.  HENT:  Head: Normocephalic and atraumatic.  Mouth/Throat: Oropharynx is clear and moist. No oropharyngeal exudate.    Eyes: Conjunctivae and EOM are normal. Pupils are equal, round, and reactive to light. No scleral icterus.  Neck: Normal range of motion. Neck supple. No thyromegaly present.  Cardiovascular: Normal rate, regular rhythm, normal heart sounds and intact distal pulses.   Pulmonary/Chest: Effort normal and breath sounds normal. No respiratory distress. She has no wheezes. She has no rales.  Lymphadenopathy:    She has no cervical adenopathy.  Neurological: She is alert and oriented to person, place, and time. No cranial nerve deficit or sensory deficit. She exhibits normal muscle tone. Gait (slowed, unsteady) abnormal.       No nystagmus  Skin: Skin is warm. No rash noted.          Assessment & Plan:

## 2010-06-19 NOTE — Telephone Encounter (Signed)
Son says that yesterday her blood sugar dropped to 43, he gave her some juice and some yogurt and check it again 30 min later and it was 122. Please advise.

## 2010-06-19 NOTE — Telephone Encounter (Signed)
Stay off all diabetes medicines  Again -- watch carefully and if  Sugar drops below 70 give juice and then a meal -- and keep checking frequently  I am working from home today-- but if there is anyone who can see her today or tomorrow it may be helpful  Please keep [place] updated

## 2010-06-19 NOTE — Patient Instructions (Signed)
Ensure you get 3 square meals every day as well as healthy snacks in between. Good to meet you today. Keep appointment with Dr. Alphonsus Sias on Monday. If any more low sugars or any worsening dizziness or passing out, please go to the ER to be checked out.

## 2010-06-20 NOTE — Telephone Encounter (Signed)
.  left message to have patient's son return my call.  

## 2010-06-21 NOTE — Telephone Encounter (Signed)
Agree that glipizide should be held Will review her sugars at appt on Monday

## 2010-06-23 ENCOUNTER — Ambulatory Visit (INDEPENDENT_AMBULATORY_CARE_PROVIDER_SITE_OTHER): Payer: Medicare Other | Admitting: Internal Medicine

## 2010-06-23 ENCOUNTER — Encounter: Payer: Self-pay | Admitting: Internal Medicine

## 2010-06-23 VITALS — BP 120/60 | HR 66 | Temp 97.6°F | Ht 62.0 in | Wt 111.0 lb

## 2010-06-23 DIAGNOSIS — E119 Type 2 diabetes mellitus without complications: Secondary | ICD-10-CM

## 2010-06-23 DIAGNOSIS — R32 Unspecified urinary incontinence: Secondary | ICD-10-CM

## 2010-06-23 DIAGNOSIS — L02419 Cutaneous abscess of limb, unspecified: Secondary | ICD-10-CM

## 2010-06-23 DIAGNOSIS — L03119 Cellulitis of unspecified part of limb: Secondary | ICD-10-CM

## 2010-06-23 DIAGNOSIS — K5909 Other constipation: Secondary | ICD-10-CM | POA: Insufficient documentation

## 2010-06-23 NOTE — Telephone Encounter (Signed)
Patient coming in today 06/23/10 @ 2:00 appt with Dr.Letvak

## 2010-06-23 NOTE — Assessment & Plan Note (Signed)
Hypoglycemic reactions on the glipizide Will stop and monitor Consider restarting at lower dose if fasting over 180

## 2010-06-23 NOTE — Patient Instructions (Addendum)
Please stop the oxybutynin. If your bladder gets much worse, you can restart it Please try sennekot-S 2 tabs once or twice a day if you feel constipated Please check fasting blood sugars every morning---call if they are persistently over 180

## 2010-06-23 NOTE — Assessment & Plan Note (Signed)
May be related to recent diarrhea and just getting going again Will have her try senekot S as needed

## 2010-06-23 NOTE — Assessment & Plan Note (Signed)
Abscess not healed after debridement Still has ulcer which is slowly closing in No antibiotics needed now

## 2010-06-23 NOTE — Progress Notes (Signed)
Subjective:    Patient ID: [name], female    DOB: 17-Feb-1920, 77 y.o.   MRN: [id number]  HPI Here with [name] her caregiver Hospitalized after leaving rehab due to diarrhea Finally resolved Not C dif I don't think  Sent home on glipizide Had been on it in past Found after fall at home and was confused Sugar was low Has been off the glipizide since then Checking it regularly. 110 fasting and 190 after eating No further low sugar reactions since the day after glipizide stopped  Foot is some better Continues to have nursing visits for checks weekly Dressings changed weekly  Has been constipated Tried laxative (?miralax) three times without effect  Getting therapy at home Caregiver 8-12 and 4-8PM Son there otherwise  Current Outpatient Prescriptions on File Prior to Visit  Medication Sig Dispense Refill  . ALPRAZolam (XANAX) 0.5 MG tablet Take 0.25 mg by mouth at bedtime as needed.        Marland Kitchen aspirin 81 MG tablet Take 81 mg by mouth daily.        . cloNIDine (CATAPRES) 0.2 MG tablet Take 0.2 mg by mouth 3 (three) times daily.        . enalapril (VASOTEC) 10 MG tablet Take 10 mg by mouth 2 (two) times daily.        Marland Kitchen estradiol (ESTRACE) 0.5 MG tablet Take 0.5 mg by mouth daily.        . hydrALAZINE (APRESOLINE) 50 MG tablet Take 50 mg by mouth 3 (three) times daily as needed.        . hydrochlorothiazide 25 MG tablet Take 25 mg by mouth daily.        Marland Kitchen levothyroxine (SYNTHROID, LEVOTHROID) 75 MCG tablet Take 75 mcg by mouth daily. 1/2 tab on Mon and Thur       . metoprolol (LOPRESSOR) 50 MG tablet Take 50 mg by mouth 2 (two) times daily.        Marland Kitchen oxybutynin (DITROPAN) 5 MG tablet Take 5 mg by mouth daily.        . Probiotic Product (PROBIOTIC PO) Take 1 capsule by mouth at bedtime.        . simvastatin (ZOCOR) 40 MG tablet TAKE ONE TABLET BY MOUTH EVERY DAY  30 tablet  6  . DISCONTD: amoxicillin (AMOXIL) 500 MG capsule Take 500 mg by mouth. Take 1 hour prior to procedure        . DISCONTD: triamcinolone (KENALOG) 0.5 % cream Apply 1 application topically 3 (three) times daily.         Past Medical History  Diagnosis Date  . Anemia     NOS  . Personal history of colonic polyps   . Diabetes mellitus     type II  . GERD (gastroesophageal reflux disease)   . Hyperlipidemia   . Hypertension   . Hypothyroidism   . Osteoarthritis   . Osteopenia   . Urinary incontinence   . Pulmonary hypertension   . Anxiety   . Renal insufficiency   . Coronary artery disease   . Aortic stenosis     Past Surgical History  Procedure Date  . Abdominal hysterectomy   . Tonsillectomy   . Cardiac catheterization 2000    cad  . Replacement total knee bilateral 05/1998  . Cataract extraction   . Coronary artery bypass graft     od  . Adenosine myoview 2007    benign, EF 69%  . Carotid endarterectomy 2011  Right  . Coronary artery bypass graft 2009  . Aortic valve replacement 2009    No family history on file.  History   Social History  . Marital Status: Widowed    Spouse Name: [name]    Number of Children: 1  . Years of Education: [name]   Occupational History  . 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 Smoking status: Former Games developer  . Smokeless tobacco: Not on file  . Alcohol Use: Yes     Wine occasionally  . Drug Use: No  . Sexually Active: Not on file   Other Topics Concern  . Not on file   Social History Narrative   Retired - Engineer, petroleum, accts receivable   Review of Systems Appetite is not great but is trying to eat Sleeping better now---uses xanax prn    Objective:   Physical Exam  Constitutional: She appears well-developed and well-nourished. No distress.  Abdominal: Soft. She exhibits no mass. There is no tenderness. There is no rebound.  Skin:       Open packed area on plantar surface on left Rest of foot has healed (much better than when I saw a few weeks ago at [facility])  Psychiatric: She has a  normal mood and affect. Her behavior is normal. Judgment and thought content normal.          Assessment & Plan:

## 2010-06-23 NOTE — Assessment & Plan Note (Signed)
Will try off oxybutynin due to potential side effects

## 2010-06-25 ENCOUNTER — Other Ambulatory Visit: Payer: Self-pay | Admitting: Cardiology

## 2010-06-30 DIAGNOSIS — L02619 Cutaneous abscess of unspecified foot: Secondary | ICD-10-CM

## 2010-06-30 DIAGNOSIS — L03119 Cellulitis of unspecified part of limb: Secondary | ICD-10-CM

## 2010-06-30 DIAGNOSIS — J700 Acute pulmonary manifestations due to radiation: Secondary | ICD-10-CM

## 2010-06-30 DIAGNOSIS — L97509 Non-pressure chronic ulcer of other part of unspecified foot with unspecified severity: Secondary | ICD-10-CM

## 2010-06-30 DIAGNOSIS — A4902 Methicillin resistant Staphylococcus aureus infection, unspecified site: Secondary | ICD-10-CM

## 2010-06-30 NOTE — H&P (Signed)
[name], [name]                  ACCOUNT NO.:  [id number]  MEDICAL RECORD NO.:  [id number]           PATIENT TYPE:  E  LOCATION:  [facility]                         FACILITY:  [facility]  PHYSICIAN:  [name], MDDATE OF BIRTH:  03-Feb-1920  DATE OF ADMISSION:  06/08/2010 DATE OF DISCHARGE:                             HISTORY & PHYSICAL   PRIMARY CARE PHYSICIAN:  [name], MD  PRIMARY CARDIOLOGIST:  Jesse Sans. Wall, MD, [facility]  CHIEF COMPLAINT:  Abdominal pain and diarrhea.  HISTORY OF PRESENT ILLNESS:  An 77 year old female who was just recently discharged from hospital on May 23, 2010 after being treated for diverticular abscess, status post I and D, was on antibiotics, started having abdominal pain mostly on the left upper and lower quadrant yesterday at noon time after which the patient had multiple episodes of diarrhea, eventually it became more bloody.  The patient also had some nausea and did not throw up.  In the ER, the patient was found to have leukocytosis and a CT abdomen and pelvis done at this time shows inflammatory stranding around the descending and sigmoid colon suggestion of thickening of wall of distal ileum.  The patient has been admitted for further workup.  The patient denies any chest pain, shortness of breath.  Denies any dizziness, loss of consciousness.  Denies any focal deficit.  Denies any headache, cough or phlegm.  Denies any dysuria or discharges.  PAST MEDICAL HISTORY: 1. History of diabetes mellitus type 2. 2. CAD, status post CABG. 3. Chronic kidney disease, creatinine around 1.3 on May 23, 2010,     presently it is IV. 4. History of anemia. 5. History of diabetes mellitus type 2. 6. History of hyperlipidemia. 7. Hypothyroidism. 8. History of aortic stenosis. 9. History of stress urinary incontinence, status post bladder     suspension. 10.History of bilateral total knee replacement. 11.History of carotid artery stenosis, status  post carotid     endarterectomy on the left. 12.History of recent diabetic foot abscess, status post I and D.  MEDICATIONS:  Per admission has to be verified.  The patient was recently discharged on; 1. Norvasc. 2. Clonidine 0.1 mg p.o. t.i.d. 3. Doxycycline for 2 days after discharge. 4. Iron sulfate. 5. Hydrocodone and acetaminophen p.o. q.4 h. p.r.n. for pain. 6. Zofran. 7. MiraLax. 8. Aspirin 81 mg daily. 9. Enalapril 10 mg p.o. b.i.d. 10.Estrace. 11.Glipizide. 12.Hydrochlorothiazide. 13.Levothyroxine. 14.Metoprolol 75 p.o. b.i.d. 15.Oxybutynin. 16.Xanax. 17.Zocor.  ALLERGIES:  SULFA, NITROFURANTOIN AND CEPHALOSPORIN.  SOCIAL HISTORY:  The patient lives by herself.  Does not smoke cigarette, drink alcohol or abuse drugs.  She quit smoking in 1950s. She is a full code.  FAMILY HISTORY:  The patient thinks her father may have diabetes mellitus type 2.  REVIEW OF SYSTEMS:  As per her history of present illness, nothing else significant.  PHYSICAL EXAMINATION:  VITAL SIGNS:  The patient examined at bedside not in acute distress. VITAL SIGNS:  Blood pressure is 180/70, pulse is 76, temperature 97.3, respirations 18, O2 sat is 98%. HEENT:  There is malar rash on both sides with known history  of acne rosacea.  No facial asymmetry.  Tongue is midline.  No discharge from ears, eyes, nose or mouth. CHEST:  Bilateral air entry present.  No rhonchi.  No crepitation. HEART:  S1, S2 heard. ABDOMEN:  Soft.  There is tenderness in the left lower and upper quadrant.  Bowel sounds feeble.  No guarding or rigidity. CNS:  The patient alert, awake and oriented to time, place and person. Moves upper and lower extremities.  Left lower extremity shows skin changes and also in the plantar aspect of foot in the first metatarsal area as at this time there is no active discharge.  There is no acute ischemic changes, sinus or clubbing.  LABORATORY DATA:  EKG shows normal sinus rhythm with  heart rate around 76 beats per minute, nonspecific ST-T changes which are comparable to the old EKG.  QTC is 508 milliseconds, QRS is 104 milliseconds.  CT of abdomen and pelvis without contrast shows inflammatory stranding around the descending and sigmoid colon with suggestion of thickening of the wall of the distal ileum, changes likely represent infectious colitis or colon disease, distal esophageal wall thickening likely due to reflex esophagitis.  CBC; WBC is 27.9, hemoglobin is 11, hematocrit is 33.2, platelets 238.  PT/INR is 14.9 and 1.1.  Complete metabolic panel sodium 132, potassium 5.8, chloride 96, carbon dioxide 20, anion gap is 16, glucose 232, BUN 86, creatinine 4, total bilirubin is 0.9, alkaline phos 135, AST 24, ALT 12, total protein 8.1, albumin 2.9, calcium 9.4.  ASSESSMENT: 1. Colitis. 2. Acute renal failure. 3. Chronic kidney disease with hyperkalemia. 4. Recent admission for left foot abscess, status per I and D. 5. History of coronary artery disease, status post CABG. 6. History of diabetes mellitus type 2. 7. History of hypertension, presently in control. 8. History of hypothyroidism. 9. History of hyperlipidemia. 10.Anemia.  PLAN: 1. At this time, we will admit the patient to telemetry. 2. For her colitis which at this time most likely it could be from C     diff colitis.  We are going to place the patient on Flagyl IV.  We     will also add Florastor.  We will get a lactic acid level.  We will     check stool for C diff, PCR, ova and parasites, culture and     sensitivity.  At this time, we are going to type and screen 2 units     PRBC as the patient is also having some rectal bleed.  We will keep     patient on pain relief medication.  Consult GI if her symptoms does     not improve. 3. Acute renal failure, on chronic kidney disease.  Her creatinine on     discharge on May 23, 2010 was 1.3, at this time it is 4, and the     potassium is also high,  but EKG is not showing any definite changes     for hyperkalemia.  At this time, we are going to repeat the     potassium again STAT.  I am going to hydrate the patient with     normal saline at 155 cc/hour with strict intake, output.  We will     also check a urine sodium, creatinine and eosinophil.  We will hold     off ACE inhibitor, recheck labs in a.m. again.  At this time, we     are going to repeat STAT BMET to check her potassium. 4.  Hypertension, uncontrolled.  We are going to keep the patient on     her Norvasc and clonidine and metoprolol and p.r.n. hydralazine.     We will hold off enalapril because of acute renal failure.  If the     patient cannot take her p.o., then we may have to keep the patient     on chronic patch. 5. Diabetes mellitus type 2.  At this time, we will check CBG, we will     consider sliding scale.  Further recommendation as condition     evolves.     [name], MD     ANK/MEDQ  D:  06/09/2010  T:  06/09/2010  Job:  [id number]  cc:   [name], MD Jesse Sans. Daleen Squibb, MD, [facility]  Electronically Signed by Midge Minium MD on 06/30/2010 07:26:02 AM

## 2010-07-02 ENCOUNTER — Telehealth: Payer: Self-pay | Admitting: *Deleted

## 2010-07-02 NOTE — Telephone Encounter (Signed)
Social worker with [employer] called to let you know that pt had been referred for social work but she has now declined their services.

## 2010-07-02 NOTE — Telephone Encounter (Signed)
noted 

## 2010-07-06 HISTORY — PX: OTHER SURGICAL HISTORY: SHX169

## 2010-07-18 ENCOUNTER — Ambulatory Visit (HOSPITAL_COMMUNITY): Payer: Medicare Other

## 2010-07-18 ENCOUNTER — Inpatient Hospital Stay (HOSPITAL_COMMUNITY)
Admission: RE | Admit: 2010-07-18 | Discharge: 2010-07-20 | DRG: 580 | Disposition: A | Discharge status: Home / Self Care | Payer: Medicare Other | Source: Ambulatory Visit | Attending: Orthopedic Surgery | Admitting: Orthopedic Surgery

## 2010-07-18 ENCOUNTER — Other Ambulatory Visit: Payer: Self-pay | Admitting: Orthopedic Surgery

## 2010-07-18 DIAGNOSIS — I10 Essential (primary) hypertension: Secondary | ICD-10-CM | POA: Diagnosis present

## 2010-07-18 DIAGNOSIS — Z951 Presence of aortocoronary bypass graft: Secondary | ICD-10-CM

## 2010-07-18 DIAGNOSIS — E119 Type 2 diabetes mellitus without complications: Secondary | ICD-10-CM | POA: Diagnosis present

## 2010-07-18 DIAGNOSIS — A4902 Methicillin resistant Staphylococcus aureus infection, unspecified site: Secondary | ICD-10-CM | POA: Diagnosis present

## 2010-07-18 DIAGNOSIS — D638 Anemia in other chronic diseases classified elsewhere: Secondary | ICD-10-CM | POA: Diagnosis present

## 2010-07-18 DIAGNOSIS — Z882 Allergy status to sulfonamides status: Secondary | ICD-10-CM

## 2010-07-18 DIAGNOSIS — I2510 Atherosclerotic heart disease of native coronary artery without angina pectoris: Secondary | ICD-10-CM | POA: Diagnosis present

## 2010-07-18 DIAGNOSIS — E039 Hypothyroidism, unspecified: Secondary | ICD-10-CM | POA: Diagnosis present

## 2010-07-18 DIAGNOSIS — L089 Local infection of the skin and subcutaneous tissue, unspecified: Principal | ICD-10-CM | POA: Diagnosis present

## 2010-07-18 DIAGNOSIS — Z96659 Presence of unspecified artificial knee joint: Secondary | ICD-10-CM

## 2010-07-18 LAB — CBC
HCT: 22.1 % — ABNORMAL LOW (ref 36.0–46.0)
Hemoglobin: 7.5 g/dL — ABNORMAL LOW (ref 12.0–15.0)
MCH: 30.2 pg (ref 26.0–34.0)
MCHC: 33.9 g/dL (ref 30.0–36.0)

## 2010-07-18 LAB — DIFFERENTIAL
Lymphocytes Relative: 8 % — ABNORMAL LOW (ref 12–46)
Monocytes Absolute: 1.5 10*3/uL — ABNORMAL HIGH (ref 0.1–1.0)
Monocytes Relative: 10 % (ref 3–12)
Neutro Abs: 12 10*3/uL — ABNORMAL HIGH (ref 1.7–7.7)

## 2010-07-18 LAB — BASIC METABOLIC PANEL
BUN: 26 mg/dL — ABNORMAL HIGH (ref 6–23)
CO2: 24 mEq/L (ref 19–32)
Chloride: 98 mEq/L (ref 96–112)
Creatinine, Ser: 1.44 mg/dL — ABNORMAL HIGH (ref 0.50–1.10)

## 2010-07-18 LAB — HEMOGLOBIN AND HEMATOCRIT, BLOOD: HCT: 19.4 % — ABNORMAL LOW (ref 36.0–46.0)

## 2010-07-18 LAB — GLUCOSE, CAPILLARY
Glucose-Capillary: 131 mg/dL — ABNORMAL HIGH (ref 70–99)
Glucose-Capillary: 171 mg/dL — ABNORMAL HIGH (ref 70–99)

## 2010-07-18 LAB — GRAM STAIN

## 2010-07-19 LAB — IRON AND TIBC
Iron: 21 ug/dL — ABNORMAL LOW (ref 42–135)
TIBC: 141 ug/dL — ABNORMAL LOW (ref 250–470)
UIBC: 120 ug/dL

## 2010-07-19 LAB — BASIC METABOLIC PANEL
BUN: 22 mg/dL (ref 6–23)
Chloride: 100 mEq/L (ref 96–112)
Creatinine, Ser: 1.25 mg/dL — ABNORMAL HIGH (ref 0.50–1.10)
Glucose, Bld: 204 mg/dL — ABNORMAL HIGH (ref 70–99)
Potassium: 3.7 mEq/L (ref 3.5–5.1)

## 2010-07-19 LAB — FOLATE: Folate: 8.3 ng/mL

## 2010-07-19 LAB — VITAMIN B12: Vitamin B-12: 402 pg/mL (ref 211–911)

## 2010-07-19 LAB — PRO B NATRIURETIC PEPTIDE: Pro B Natriuretic peptide (BNP): 14067 pg/mL — ABNORMAL HIGH (ref 0–450)

## 2010-07-19 LAB — GLUCOSE, CAPILLARY: Glucose-Capillary: 84 mg/dL (ref 70–99)

## 2010-07-20 LAB — TYPE AND SCREEN
ABO/RH(D): O POS
Unit division: 0

## 2010-07-20 LAB — GLUCOSE, CAPILLARY
Glucose-Capillary: 130 mg/dL — ABNORMAL HIGH (ref 70–99)
Glucose-Capillary: 166 mg/dL — ABNORMAL HIGH (ref 70–99)

## 2010-07-20 LAB — CBC
Platelets: 369 10*3/uL (ref 150–400)
RBC: 2.61 MIL/uL — ABNORMAL LOW (ref 3.87–5.11)
WBC: 10.8 10*3/uL — ABNORMAL HIGH (ref 4.0–10.5)

## 2010-07-22 LAB — WOUND CULTURE

## 2010-07-23 LAB — ANAEROBIC CULTURE

## 2010-07-25 ENCOUNTER — Encounter (INDEPENDENT_AMBULATORY_CARE_PROVIDER_SITE_OTHER): Payer: Medicare Other | Admitting: *Deleted

## 2010-07-25 ENCOUNTER — Encounter: Payer: Self-pay | Admitting: Internal Medicine

## 2010-07-25 ENCOUNTER — Ambulatory Visit (INDEPENDENT_AMBULATORY_CARE_PROVIDER_SITE_OTHER): Payer: Medicare Other | Admitting: Internal Medicine

## 2010-07-25 VITALS — BP 187/74 | HR 75 | Resp 16 | Ht 62.0 in | Wt 109.0 lb

## 2010-07-25 DIAGNOSIS — M7989 Other specified soft tissue disorders: Secondary | ICD-10-CM

## 2010-07-25 DIAGNOSIS — I771 Stricture of artery: Secondary | ICD-10-CM

## 2010-07-25 DIAGNOSIS — I2510 Atherosclerotic heart disease of native coronary artery without angina pectoris: Secondary | ICD-10-CM

## 2010-07-25 DIAGNOSIS — R609 Edema, unspecified: Secondary | ICD-10-CM

## 2010-07-25 MED ORDER — FUROSEMIDE 20 MG PO TABS: ORAL_TABLET | ORAL | Status: DC

## 2010-07-25 NOTE — Assessment & Plan Note (Signed)
We'll undertake ABIs next week

## 2010-07-25 NOTE — Progress Notes (Signed)
Addended by: Sherri Rad C on: 07/25/2010 05:24 PM   Modules accepted: Orders

## 2010-07-25 NOTE — Progress Notes (Signed)
HPI  [name] is a 77 y.o. female Seen as a walk-in today following amputation of her left toe last week by Dr. August Saucer. How it is that she came to be here today is not clear. She saw him yesterday And he apparently expressed concern about her edema. He was not apparently concerned about infection.  She has a history of coronary artery disease. She is status post   aortic valve replacement using a 21-mm   [employer] Mitroflow pericardial tissue valve, as well as status post coronary artery bypass graft x3 using a left internal mammary artery to   distal left anterior descending coronary artery, saphenous vein graft to distal right coronary artery, saphenous vein graft to 3rd diagonal   branch.  She had a time of surgery in 2009 normal left ventricular function. SHe has a long-standing history of hypertension.  Edema is not recorded and prior office notes.  Past Medical History  Diagnosis Date  . Anemia     NOS  . Personal history of colonic polyps   . Diabetes mellitus     type II  . GERD (gastroesophageal reflux disease)   . Hyperlipidemia   . Hypertension   . Hypothyroidism   . Osteoarthritis   . Osteopenia   . Urinary incontinence   . Pulmonary hypertension   . Anxiety   . Renal insufficiency   . Coronary artery disease   . Aortic stenosis     Past Surgical History  Procedure Date  . Abdominal hysterectomy   . Tonsillectomy   . Cardiac catheterization 2000    cad  . Replacement total knee bilateral 05/1998  . Cataract extraction   . Coronary artery bypass graft     od  . Adenosine myoview 2007    benign, EF 69%  . Carotid endarterectomy 2011    Right  . Coronary artery bypass graft 2009  . Aortic valve replacement 2009    Current Outpatient Prescriptions  Medication Sig Dispense Refill  . ALPRAZolam (XANAX) 0.5 MG tablet Take 0.25 mg by mouth at bedtime as needed.        Marland Kitchen aspirin 81 MG tablet Take 81 mg by mouth daily.        . cloNIDine (CATAPRES) 0.2  MG tablet Take 0.2 mg by mouth 3 (three) times daily.        . enalapril (VASOTEC) 10 MG tablet TAKE ONE TABLET BY MOUTH TWICE DAILY  60 tablet  12  . estradiol (ESTRACE) 0.5 MG tablet Take 0.5 mg by mouth daily.        . hydrALAZINE (APRESOLINE) 50 MG tablet Take 50 mg by mouth 3 (three) times daily as needed.        . hydrochlorothiazide 25 MG tablet Take 25 mg by mouth daily.        . metoprolol (LOPRESSOR) 50 MG tablet Take 50 mg by mouth 2 (two) times daily.        . Probiotic Product (PROBIOTIC PO) Take 1 capsule by mouth at bedtime.        . simvastatin (ZOCOR) 40 MG tablet TAKE ONE TABLET BY MOUTH EVERY DAY  30 tablet  6  . levothyroxine (SYNTHROID, LEVOTHROID) 75 MCG tablet Take 75 mcg by mouth daily. 1/2 tab on Mon and Thur         Allergies  Allergen Reactions  . Captopril     REACTION: unspecified  . Enalapril Maleate     REACTION: cough  . Nitrofurantoin  REACTION: itching  . Ramipril     REACTION: unspecified  . Verapamil     REACTION: unspecified    Review of Systems negative except from HPI and PMH  Physical Exam Well developed and well nourished in no acute distress HENT normal E scleral and icterus clear Neck Supple Clear to ausculation Regular rate and rhythm, no murmurs gallops or rub Soft with active bowel sounds No clubbing cyanosis; there is edema left greater than right. Her left foot is bandaged and is not unwrapped. There is mild erythema and warmth. Alert and oriented, grossly normal motor and sensory function Skin Warm and Dry  ECG Demonstrates sinus rhythm at 78 Intervals 0.18/0.09/0.46 There is evidence of left ventricular hypertrophy and possible prior septal infarct Is not appreciably different from her preoperative cardiogram dated July 13 for  Prior laboratories demonstrated modest renal insufficiency with a creatinine of 1.9.  Assessment and  Plan

## 2010-07-25 NOTE — Patient Instructions (Addendum)
Your physician has recommended you make the following change in your medication:  1) Stop HCTZ 2) Start furosemide (lasix) 20mg  once daily.  Your physician recommends that you schedule a follow-up appointment in: 1 week with Dr. Alphonsus Sias.  Your physician recommends that you have lab work in: 1 week with Dr. Alphonsus Sias- BMP (edema).  Your physician has requested that you have a lower extremity venous duplex. This test is an ultrasound of the veins in the legs It looks at venous blood flow that carries blood from the heart to the legs. Allow one hour for a Lower Venous exam.  There are no restrictions or special instructions.

## 2010-07-25 NOTE — Assessment & Plan Note (Signed)
Her did not appear to be any acute ischemic issue or arrhythmic issue to contribute to her peripheral edema.

## 2010-07-25 NOTE — Assessment & Plan Note (Addendum)
The patient has postoperative asymmetric edema. As best as I can tell this is new. Will exclude a DVT although the likely this is low. We will plan to Treat her with a low dose diuretic for about one week. She is to followup with Dr. Cato Mulligan next week with a metabolic profile. She has a history of renal insufficiency and so we have to go very guardedly with a diuretic.  Venous Dopplers were negative. It was also noted however that there is poor arterial circulation. We'll undertake ABIs next week

## 2010-07-28 ENCOUNTER — Encounter: Payer: Self-pay | Admitting: Internal Medicine

## 2010-07-28 ENCOUNTER — Encounter (INDEPENDENT_AMBULATORY_CARE_PROVIDER_SITE_OTHER): Payer: Medicare Other | Admitting: Cardiology

## 2010-07-28 ENCOUNTER — Ambulatory Visit (INDEPENDENT_AMBULATORY_CARE_PROVIDER_SITE_OTHER): Payer: Medicare Other | Admitting: Internal Medicine

## 2010-07-28 DIAGNOSIS — L03119 Cellulitis of unspecified part of limb: Secondary | ICD-10-CM

## 2010-07-28 DIAGNOSIS — L02419 Cutaneous abscess of limb, unspecified: Secondary | ICD-10-CM

## 2010-07-28 DIAGNOSIS — R609 Edema, unspecified: Secondary | ICD-10-CM

## 2010-07-28 DIAGNOSIS — I739 Peripheral vascular disease, unspecified: Secondary | ICD-10-CM

## 2010-07-28 DIAGNOSIS — I10 Essential (primary) hypertension: Secondary | ICD-10-CM

## 2010-07-28 DIAGNOSIS — I771 Stricture of artery: Secondary | ICD-10-CM

## 2010-07-28 NOTE — Progress Notes (Signed)
Subjective:    Patient ID: [name], female    DOB: Dec 02, 1920, 77 y.o.   MRN: [id number]  HPI Seen last week by Dr Graciela Husbands for increased edema Put her on HCTZ--but she hasn't taken it No blood clots seen and had ABI today which she reports was okay  She notes that the edema is noticeable in the evening but is gone by the AM Tries to keep leg elevated as much as possible Is up to fix meals, still drives, etc  Breathing is okay No chest pain  Current Outpatient Prescriptions on File Prior to Visit  Medication Sig Dispense Refill  . ALPRAZolam (XANAX) 0.5 MG tablet Take 0.25 mg by mouth at bedtime as needed.        Marland Kitchen aspirin 81 MG tablet Take 81 mg by mouth daily.        . cloNIDine (CATAPRES) 0.2 MG tablet Take 0.2 mg by mouth 3 (three) times daily.        . enalapril (VASOTEC) 10 MG tablet TAKE ONE TABLET BY MOUTH TWICE DAILY  60 tablet  12  . estradiol (ESTRACE) 0.5 MG tablet Take 0.5 mg by mouth daily.        . furosemide (LASIX) 20 MG tablet Take one tablet by mouth once daily.  30 tablet  6  . hydrALAZINE (APRESOLINE) 50 MG tablet Take 50 mg by mouth 3 (three) times daily as needed.        Marland Kitchen levothyroxine (SYNTHROID, LEVOTHROID) 75 MCG tablet Take 75 mcg by mouth daily. 1/2 tab on Mon and Thur       . metoprolol (LOPRESSOR) 50 MG tablet Take 50 mg by mouth 2 (two) times daily.        . Probiotic Product (PROBIOTIC PO) Take 1 capsule by mouth at bedtime.        . simvastatin (ZOCOR) 40 MG tablet TAKE ONE TABLET BY MOUTH EVERY DAY  30 tablet  6    Allergies  Allergen Reactions  . Captopril     REACTION: unspecified  . Enalapril Maleate     REACTION: cough  . Nitrofurantoin     REACTION: itching  . Ramipril     REACTION: unspecified  . Verapamil     REACTION: unspecified    Past Medical History  Diagnosis Date  . Anemia     NOS  . Personal history of colonic polyps   . Diabetes mellitus     type II  . GERD (gastroesophageal reflux disease)   . Hyperlipidemia     . Hypertension   . Hypothyroidism   . Osteoarthritis   . Osteopenia   . Urinary incontinence   . Pulmonary hypertension   . Anxiety   . Renal insufficiency   . Coronary artery disease   . Aortic stenosis     Past Surgical History  Procedure Date  . Abdominal hysterectomy   . Tonsillectomy   . Cardiac catheterization 2000    cad  . Replacement total knee bilateral 05/1998  . Cataract extraction   . Coronary artery bypass graft     od  . Adenosine myoview 2007    benign, EF 69%  . Carotid endarterectomy 2011    Right  . Coronary artery bypass graft 2009  . Aortic valve replacement 2009    No family history on file.  History   Social History  . Marital Status: Widowed    Spouse Name: [name]    Number of Children: 1  .  Years of Education: [name]   Occupational History  . 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 Smoking status: Former Games developer  . Smokeless tobacco: Not on file  . Alcohol Use: Yes     Wine occasionally  . Drug Use: No  . Sexually Active: Not on file   Other Topics Concern  . Not on file   Social History Narrative   Retired - Engineer, petroleum, accts receivable   Review of Systems Appetite is okay Weight is up a few pounds from last weight     Objective:   Physical Exam  Constitutional: She appears well-developed and well-nourished. No distress.  Neck: Normal range of motion. No thyromegaly present.  Cardiovascular: Normal rate, regular rhythm and normal heart sounds.   No murmur heard. Pulmonary/Chest: Effort normal and breath sounds normal. No respiratory distress. She has no wheezes. She has no rales.  Musculoskeletal: She exhibits edema.  Lymphadenopathy:    She has no cervical adenopathy.  Skin:       Left foot has ~2mm open wound where amputation was Slight drainage but no inflammation          Assessment & Plan:

## 2010-07-28 NOTE — Assessment & Plan Note (Signed)
BP Readings from Last 3 Encounters:  07/28/10 188/63  07/25/10 187/74  06/23/10 120/60   Has been up lately  Has had highly labile BP for some time--had been seen by Dr Val Eagle for years for this Will hold off on Rx increases for now

## 2010-07-28 NOTE — Assessment & Plan Note (Signed)
She feels it is better Hasn't used the HCTZ Discussed keeping it elevated Reports okay venous and arterial tests

## 2010-07-28 NOTE — Assessment & Plan Note (Signed)
Improved now Had to have amputation but looks clean now

## 2010-07-28 NOTE — Patient Instructions (Signed)
Please cancel the August appointment here

## 2010-07-30 ENCOUNTER — Encounter: Payer: Self-pay | Admitting: Cardiology

## 2010-08-01 ENCOUNTER — Encounter: Payer: Self-pay | Admitting: Cardiology

## 2010-08-01 ENCOUNTER — Ambulatory Visit (INDEPENDENT_AMBULATORY_CARE_PROVIDER_SITE_OTHER): Payer: Medicare Other | Admitting: Cardiology

## 2010-08-01 VITALS — BP 216/90 | HR 84 | Ht 63.0 in | Wt 116.8 lb

## 2010-08-01 DIAGNOSIS — I10 Essential (primary) hypertension: Secondary | ICD-10-CM

## 2010-08-01 MED ORDER — AMLODIPINE BESYLATE 5 MG PO TABS: 5.0000 mg | ORAL_TABLET | Freq: Every day | ORAL | Status: DC

## 2010-08-01 MED ORDER — METOPROLOL TARTRATE 50 MG PO TABS: 75.0000 mg | ORAL_TABLET | Freq: Two times a day (BID) | ORAL | Status: DC

## 2010-08-01 MED ORDER — CLONIDINE HCL 0.3 MG PO TABS: 0.3000 mg | ORAL_TABLET | Freq: Three times a day (TID) | ORAL | Status: DC

## 2010-08-01 NOTE — Patient Instructions (Addendum)
Your physician recommends that you return for lab work in: 1 week for a bmp .You will also need a blood pressure check when you come in for lab work in 1 week  Your physician recommends that you schedule a follow-up appointment in: 4 weeks with Dr. Daleen Squibb Your physician has recommended you make the following change in your medication:  START:   taking your Furosemide and your amlodipine INCREASE:  Clonidine and Metoprolol

## 2010-08-01 NOTE — Assessment & Plan Note (Signed)
Stable

## 2010-08-01 NOTE — Assessment & Plan Note (Signed)
This is clearly out of control. She has not started her Lasix.  We have asked to start her Lasix on a daily basis. We will increase her clonidine to 0.3 mg 3 times a day. We will also increase her metoprolol to 75 mg twice a day. I've also start her back on amlodipine at 5 mg a day.  She will need a metabolic profile and a week to 10 days and see [place] back for blood pressure check at that time.  I will see her back in about 4 weeks myself.

## 2010-08-01 NOTE — Assessment & Plan Note (Signed)
Status post aortic valve replacement. Doing well

## 2010-08-01 NOTE — Progress Notes (Signed)
HPI [name] returns for evaluation and management her hypertension.  She is been through a lot both difficulties since I last saw her developing a left foot abscess which has had prolonged healing after incision and draining. She was recently seen in the office by Dr. Graciela Husbands for leg swelling. Venous Doppler showed no DVT and arterial Dopplers surprisingly showed an ABI of 0.85 on the left and 0.95 on the right. Blood flow was felt to be adequate for tissue healing.  Since being in the hospital, her blood pressures been up and down. Is running quite high today. Looking back to her regimen on her last visit with me, several changes have been made. She is no longer on HCTZ, her clonidine doses then reduced and she is no longer on Norvasc. Her metoprolol dose has also been reduced.  She denies any chest pain or angina.  EKG was not repeated today.   Past Surgical History  Procedure Date  . Abdominal hysterectomy   . Tonsillectomy   . Cardiac catheterization 2000    cad  . Replacement total knee bilateral 05/1998  . Cataract extraction   . Coronary artery bypass graft     od  . Adenosine myoview 2007    benign, EF 69%  . Carotid endarterectomy 2011    Right  . Coronary artery bypass graft 2009  . Aortic valve replacement 2009  . Amputation-left great toe 7/12    Dr August Saucer    No family history on file.  History   Social History  . Marital Status: Widowed    Spouse Name: [name]    Number of Children: 1  . Years of Education: [name]   Occupational History  . 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 Smoking status: Former Games developer  . Smokeless tobacco: Not on file  . Alcohol Use: Yes     Wine occasionally  . Drug Use: No  . Sexually Active: Not on file   Other Topics Concern  . Not on file   Social History Narrative   Retired - [employer], accts receivable    Allergies  Allergen Reactions  . Captopril     REACTION: unspecified  .  Enalapril Maleate     REACTION: cough  . Nitrofurantoin     REACTION: itching  . Ramipril     REACTION: unspecified  . Verapamil     REACTION: unspecified    Current Outpatient Prescriptions  Medication Sig Dispense Refill  . ALPRAZolam (XANAX) 0.5 MG tablet Take 0.25 mg by mouth at bedtime as needed.        Marland Kitchen aspirin 81 MG tablet Take 81 mg by mouth daily.        . cloNIDine (CATAPRES) 0.2 MG tablet Take 0.2 mg by mouth 3 (three) times daily.        . enalapril (VASOTEC) 10 MG tablet TAKE ONE TABLET BY MOUTH TWICE DAILY  60 tablet  12  . estradiol (ESTRACE) 0.5 MG tablet Take 0.5 mg by mouth daily.        . hydrALAZINE (APRESOLINE) 50 MG tablet Take 50 mg by mouth 3 (three) times daily as needed.        Marland Kitchen levothyroxine (SYNTHROID, LEVOTHROID) 75 MCG tablet Take 75 mcg by mouth daily. 1/2 tab on Mon and Thur       . metoprolol (LOPRESSOR) 50 MG tablet Take 50 mg by mouth 2 (two) times daily.        Marland Kitchen  Probiotic Product (PROBIOTIC PO) Take 1 capsule by mouth at bedtime.        . simvastatin (ZOCOR) 40 MG tablet TAKE ONE TABLET BY MOUTH EVERY DAY  30 tablet  6  . furosemide (LASIX) 20 MG tablet Take one tablet by mouth once daily.  30 tablet  6    ROS Negative other than HPI.   PE General Appearance: well developed, well nourished in no acute distress HEENT: symmetrical face, PERRLA, good dentition  Neck: no JVD, thyromegaly, or adenopathy, trachea midline Chest: symmetric without deformity Cardiac: PMI non-displaced, RRR, normal S1, S2, soft systolic murmur. S4 gallop Lung: clear to ausculation and percussion Vascular: all pulses full without bruits  Abdominal: nondistended, nontender, good bowel sounds, no HSM, no bruits Extremities: no cyanosis, 2+ pitting edema in the lower extremities. Left foot in a wrap and brace, no sign of DVT, no varicosities  Skin: normal color, no rashes Neuro: alert and oriented x 3, non-focal Pysch: normal affect Filed Vitals:   08/01/10 1401    BP: 216/90  Pulse: 84  Height: 5\' 3"  (1.6 m)  Weight: 116 lb 12.8 oz (52.98 kg)    EKG  Labs and Studies Reviewed.   Lab Results  Component Value Date   WBC 10.8* 07/20/2010   HGB 7.8* 07/20/2010   HCT 23.2* 07/20/2010   MCV 88.9 07/20/2010   PLT 369 07/20/2010      Chemistry      Component Value Date/Time   NA 134* 07/19/2010 0500   K 3.7 07/19/2010 0500   CL 100 07/19/2010 0500   CO2 26 07/19/2010 0500   BUN 22 07/19/2010 0500   CREATININE 1.25* 07/19/2010 0500      Component Value Date/Time   CALCIUM 8.3* 07/19/2010 0500   ALKPHOS 108 06/10/2010 0555   AST 16 06/10/2010 0555   ALT 7 06/10/2010 0555   BILITOT 0.8 06/10/2010 0555       Lab Results  Component Value Date   CHOL 166 02/17/2010   CHOL 158 01/28/2009   CHOL 141 07/23/2008   Lab Results  Component Value Date   HDL 61 02/17/2010   HDL 61.10 01/28/2009   HDL 16.10 07/23/2008   Lab Results  Component Value Date   LDLCALC 79 02/17/2010   LDLCALC 71 01/28/2009   LDLCALC 63 07/23/2008   Lab Results  Component Value Date   TRIG 132 02/17/2010   TRIG 131.0 01/28/2009   TRIG 98.0 07/23/2008   Lab Results  Component Value Date   CHOLHDL 2.7 Ratio 02/17/2010   CHOLHDL 3 01/28/2009   CHOLHDL 2 07/23/2008   Lab Results  Component Value Date   HGBA1C 7.9* 07/19/2010   Lab Results  Component Value Date   ALT 7 06/10/2010   AST 16 06/10/2010   ALKPHOS 108 06/10/2010   BILITOT 0.8 06/10/2010   Lab Results  Component Value Date   TSH 0.140* 06/09/2010

## 2010-08-07 NOTE — Op Note (Signed)
  [name], [name] NO.:  [id number]  MEDICAL RECORD NO.:  [id number]  LOCATION:  5004                         FACILITY:  [facility]  PHYSICIAN:  Burnard Bunting, M.D.    DATE OF BIRTH:  1920-05-08  DATE OF PROCEDURE:  07/18/2010 DATE OF DISCHARGE:                              OPERATIVE REPORT   PREOPERATIVE DIAGNOSIS:  Left great toe infection involving the metatarsophalangeal joint.  POSTOPERATIVE DIAGNOSIS:  Left great toe infection involving the metatarsophalangeal joint.  PROCEDURE:  Left foot ray amputation.  SURGEON:  Burnard Bunting, MD  ASSISTANT:  None.  ANESTHESIA:  Ankle block plus IV sedation.  INDICATIONS:  [name] is a patient with left foot infection presents now for ray resection after explanation of risks and benefits.  SPECIMENS:  Toe sent to pathology.  Cultures obtained x1.  PROCEDURE IN DETAIL:  The patient was brought to the operating room where general endotracheal anesthesia was induced.  Preoperative antibiotics were administered.  Left foot was prescrubbed with Hibiclens and draped in sterile manner.  Ankle Esmarch was utilized for approximately 15 minutes.  Elliptical incision was made around the incision.  Skin and subcutaneous tissue were sharply divided.  The elliptical incision had to include 3 areas of devitalized tissue including sinus callus, sinus tract on the plantar aspect of the MTP joint as well as dorsal sinus tract over the MTP joint and a medial sinus tract.  All of these have to be incorporated into the excised skin.  The toe was then removed.  Incision was extended proximally along the metatarsal shaft. 1. The metatarsal was then cut with an oscillating saw in beveled     fashion.  Tourniquet was released at this time.  Bleeding points     encountered and controlled with electrocautery.  Thorough     irrigation was performed.  Incision was closed using a 2-0 Vicryl     and 2-0 nylon to close the dead space.   Bulky dressing was applied.     The patient tolerated the procedure well without immediate     complications.  She was transferred to the recovery room in stable     condition.    Burnard Bunting, M.D.    GSD/MEDQ  D:  07/18/2010  T:  07/19/2010  Job:  [id number]  Electronically Signed by Reece Agar.  DEAN M.D. on 08/07/2010 08:18:17 AM

## 2010-08-08 ENCOUNTER — Telehealth: Payer: Self-pay | Admitting: *Deleted

## 2010-08-08 ENCOUNTER — Ambulatory Visit (INDEPENDENT_AMBULATORY_CARE_PROVIDER_SITE_OTHER): Payer: Medicare Other | Admitting: *Deleted

## 2010-08-08 DIAGNOSIS — I10 Essential (primary) hypertension: Secondary | ICD-10-CM

## 2010-08-08 DIAGNOSIS — N259 Disorder resulting from impaired renal tubular function, unspecified: Secondary | ICD-10-CM

## 2010-08-08 LAB — BASIC METABOLIC PANEL
BUN: 49 mg/dL — ABNORMAL HIGH (ref 6–23)
Calcium: 8.2 mg/dL — ABNORMAL LOW (ref 8.4–10.5)
Chloride: 98 mEq/L (ref 96–112)
Creatinine, Ser: 3.3 mg/dL — ABNORMAL HIGH (ref 0.4–1.2)
GFR: 14.25 mL/min — CL (ref >60.00)

## 2010-08-08 NOTE — Telephone Encounter (Signed)
DOD Dr. Myrtis Ser has reviewed pt bmp and recommended pt stop enalapril, furosemide, and to make sure she had stopped her HCTZ. Also repeat stat bmp on Monday with DOD office visit as Dr. Daleen Squibb is not in the office on Monday.  I have reviewed at length all medications to discontinue with pt and appts for Monday with our lab and Dr. Riley Kill.  Pt is agreeable to plan.  Mylo Red RN

## 2010-08-11 ENCOUNTER — Other Ambulatory Visit (INDEPENDENT_AMBULATORY_CARE_PROVIDER_SITE_OTHER): Payer: Medicare Other | Admitting: *Deleted

## 2010-08-11 ENCOUNTER — Ambulatory Visit (INDEPENDENT_AMBULATORY_CARE_PROVIDER_SITE_OTHER): Payer: Medicare Other | Admitting: Cardiology

## 2010-08-11 ENCOUNTER — Encounter: Payer: Self-pay | Admitting: Cardiology

## 2010-08-11 DIAGNOSIS — Z952 Presence of prosthetic heart valve: Secondary | ICD-10-CM | POA: Insufficient documentation

## 2010-08-11 DIAGNOSIS — I359 Nonrheumatic aortic valve disorder, unspecified: Secondary | ICD-10-CM

## 2010-08-11 DIAGNOSIS — N259 Disorder resulting from impaired renal tubular function, unspecified: Secondary | ICD-10-CM

## 2010-08-11 DIAGNOSIS — R6883 Chills (without fever): Secondary | ICD-10-CM

## 2010-08-11 LAB — BASIC METABOLIC PANEL
Calcium: 8.3 mg/dL — ABNORMAL LOW (ref 8.4–10.5)
GFR: 12.55 mL/min — CL (ref >60.00)
Glucose, Bld: 201 mg/dL — ABNORMAL HIGH (ref 70–99)
Sodium: 131 mEq/L — ABNORMAL LOW (ref 135–145)

## 2010-08-11 NOTE — Progress Notes (Signed)
HPI: This patient was seen as an add on.  She is followed by Dr. Daleen Squibb, and was seen July 27.  Apparently there are some abnormal labs and she has been added on, came in for labs, and is being seen early.  Her BUN and Cr were markedly elevated, and this is new.  She came in for follow up labs today.  She had meds adjusted recently.  She has also had some recent sweating and feeling chilled.  She has had a foot abscess, and notably some difficulty with healing.  She has a history of CABG with AVR as noted below.  Jan 26, 2007:   PROCEDURE:  Median sternotomy for aortic valve replacement (21 mm   [employer] Mitroflow pericardial tissue valve) and coronary artery   bypass grafting x3 (left internal mammary artery to distal left anterior   descending coronary artery, saphenous vein graft to distal right   coronary artery, saphenous vein graft to third diagonal branch,   endoscopic saphenous vein harvest from left thigh).      SURGEON:  Salvatore Decent. Cornelius Moras, M.D.       Current Outpatient Prescriptions  Medication Sig Dispense Refill  . ALPRAZolam (XANAX) 0.5 MG tablet Take 0.25 mg by mouth at bedtime as needed.        Marland Kitchen amLODipine (NORVASC) 5 MG tablet Take 1 tablet (5 mg total) by mouth daily.  30 tablet  11  . aspirin 81 MG tablet Take 81 mg by mouth daily.        . cloNIDine (CATAPRES) 0.3 MG tablet Take 1 tablet (0.3 mg total) by mouth 3 (three) times daily.  90 tablet  11  . estradiol (ESTRACE) 0.5 MG tablet Take 0.5 mg by mouth daily.        . hydrALAZINE (APRESOLINE) 50 MG tablet Take 50 mg by mouth 3 (three) times daily as needed.        Marland Kitchen levothyroxine (SYNTHROID, LEVOTHROID) 75 MCG tablet Take 75 mcg by mouth daily. 1/2 tab on Mon and Thur       . metoprolol (LOPRESSOR) 50 MG tablet Take 1.5 tablets (75 mg total) by mouth 2 (two) times daily.  90 tablet  11  . Probiotic Product (PROBIOTIC PO) Take 1 capsule by mouth at bedtime.        . simvastatin (ZOCOR) 40 MG tablet TAKE ONE TABLET BY  MOUTH EVERY DAY  30 tablet  6    Allergies  Allergen Reactions  . Captopril     REACTION: unspecified  . Enalapril Maleate     REACTION: cough  . Nitrofurantoin     REACTION: itching  . Ramipril     REACTION: unspecified  . Verapamil     REACTION: unspecified    Past Medical History  Diagnosis Date  . Anemia     NOS  . Personal history of colonic polyps   . Diabetes mellitus     type II  . GERD (gastroesophageal reflux disease)   . Hyperlipidemia   . Hypertension   . Hypothyroidism   . Osteoarthritis   . Osteopenia   . Urinary incontinence   . Pulmonary hypertension   . Anxiety   . Renal insufficiency   . Coronary artery disease   . Aortic stenosis     Past Surgical History  Procedure Date  . Abdominal hysterectomy   . Tonsillectomy   . Cardiac catheterization 2000    cad  . Replacement total knee bilateral 05/1998  . Cataract extraction   .  Coronary artery bypass graft     od  . Adenosine myoview 2007    benign, EF 69%  . Carotid endarterectomy 2011    Right  . Coronary artery bypass graft 2009  . Aortic valve replacement 2009  . Amputation-left great toe 7/12    Dr August Saucer    No family history on file.  History   Social History  . Marital Status: Widowed    Spouse Name: [name]    Number of Children: 1  . Years of Education: [name]   Occupational History  . 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 Smoking status: Former Games developer  . Smokeless tobacco: Not on file  . Alcohol Use: Yes     Wine occasionally  . Drug Use: No  . Sexually Active: Not on file   Other Topics Concern  . Not on file   Social History Narrative   Retired - Engineer, petroleum, accts receivable    ROS: Please see the HPI.  All other systems reviewed and negative.  PHYSICAL EXAM:  BP 164/74  Pulse 74  Resp 16  Ht 5\' 3"  (1.6 m)  Wt 117 lb (53.071 kg)  BMI 20.73 kg/m2  General: Well developed, well nourished, in no acute distress. Head:   Normocephalic and atraumatic. Neck: no JVD Lungs: Clear to auscultation and percussion. Heart: Normal S1 and S2.  Soft SEM.  No diastolic murmur.   Abdomen:  Normal bowel sounds; soft; non tender; no organomegaly Pulses: Pulses normal in all 4 extremities. Extremities: No clubbing or cyanosis. No edema.  Left foot is in a boot.   Neurologic: Alert and oriented x 3.  EKG:    ASSESSMENT AND PLAN:

## 2010-08-11 NOTE — Patient Instructions (Signed)
Your physician has requested that you have an echocardiogram. Echocardiography is a painless test that uses sound waves to create images of your heart. It provides your doctor with information about the size and shape of your heart and how well your heart's chambers and valves are working. This procedure takes approximately one hour. There are no restrictions for this procedure.   

## 2010-08-11 NOTE — Assessment & Plan Note (Signed)
BUN and Cr were significantly worse in short order.  Her diuretics have been held as has her ACE.  Will recheck these labs today.Marland Kitchen

## 2010-08-11 NOTE — Assessment & Plan Note (Signed)
She has had some sweating.  With her foot abscess, and prosthetic valve, she will need both an echo, and a pair of blood cultures which we will get arranged.

## 2010-08-11 NOTE — Assessment & Plan Note (Signed)
BP is reasonably controlled.  Will not change her meds.  She will see Dr. Daleen Squibb next week.

## 2010-08-12 ENCOUNTER — Other Ambulatory Visit (INDEPENDENT_AMBULATORY_CARE_PROVIDER_SITE_OTHER): Payer: Medicare Other | Admitting: *Deleted

## 2010-08-12 ENCOUNTER — Telehealth: Payer: Self-pay | Admitting: *Deleted

## 2010-08-12 ENCOUNTER — Encounter: Payer: Self-pay | Admitting: Cardiology

## 2010-08-12 ENCOUNTER — Ambulatory Visit (INDEPENDENT_AMBULATORY_CARE_PROVIDER_SITE_OTHER): Payer: Medicare Other | Admitting: Cardiology

## 2010-08-12 VITALS — BP 128/78 | HR 80 | Ht 63.0 in | Wt 118.8 lb

## 2010-08-12 DIAGNOSIS — I10 Essential (primary) hypertension: Secondary | ICD-10-CM

## 2010-08-12 DIAGNOSIS — N179 Acute kidney failure, unspecified: Secondary | ICD-10-CM

## 2010-08-12 DIAGNOSIS — N183 Chronic kidney disease, stage 3 unspecified: Secondary | ICD-10-CM

## 2010-08-12 DIAGNOSIS — N19 Unspecified kidney failure: Secondary | ICD-10-CM | POA: Insufficient documentation

## 2010-08-12 DIAGNOSIS — R6883 Chills (without fever): Secondary | ICD-10-CM

## 2010-08-12 DIAGNOSIS — I2510 Atherosclerotic heart disease of native coronary artery without angina pectoris: Secondary | ICD-10-CM

## 2010-08-12 DIAGNOSIS — R0989 Other specified symptoms and signs involving the circulatory and respiratory systems: Secondary | ICD-10-CM

## 2010-08-12 LAB — BASIC METABOLIC PANEL
Calcium: 8.3 mg/dL — ABNORMAL LOW (ref 8.4–10.5)
Chloride: 99 mEq/L (ref 96–112)
Creatinine, Ser: 3.8 mg/dL — ABNORMAL HIGH (ref 0.4–1.2)
Sodium: 135 mEq/L (ref 135–145)

## 2010-08-12 MED ORDER — SIMVASTATIN 20 MG PO TABS: 20.0000 mg | ORAL_TABLET | Freq: Every day | ORAL | Status: DC

## 2010-08-12 NOTE — Telephone Encounter (Signed)
RN received critical value telephone call from [name]: [employer]. RN verified by telephone readback Pt name, date of birth and medical record number. Pt Gfr 11.9 and Cr 3.8. [name] will post lab in Epic and fax to office as well. RN will make Dr Daleen Squibb aware.

## 2010-08-12 NOTE — Patient Instructions (Addendum)
Your physician has recommended you make the following change in your medication:  Decrease Simvastatin  Continue to drink plenty of water. Please observe your urine for color making sure that it is light yellow to clear.  Straw colored  If the swelling in your legs does not go down at night when at rest or if you have increased shortness of breath please call our office and let us know.

## 2010-08-12 NOTE — Telephone Encounter (Signed)
Pt aware of bmp results. She does have appt with her Kidney Dr. Val Eagle  in [facility] tomorrow at 11:00am Fax# [phone]  Office  # [phone].  Pt will have him call Dr. Daleen Squibb tomorrow during office visit. Mylo Red RN

## 2010-08-12 NOTE — Progress Notes (Signed)
HPI [name] is in close followup today of her hypertensive crisis because of mid changes in the hospital. Please see my note from a week and a half ago.  She had blon kidney function.ed a significant deterioration in kidney function. He saw Dr. Tedra Senegal yesterday time her BUN and creatinine were about the same. He thought about admitting her but with no sign of decompensation.  Repeated a stat metabolic profile today. It is pending.  She denies orthopnea or PND. She does have dependent edema but it goes down at night. She has not taken the opportunity to see the color of her urine. She is urinating on her regular basis. Her blood pressures very good today.  She was seen her nephrologist and hypertension position from [facility] on Friday. Past Medical History  Diagnosis Date  . Anemia     NOS  . Personal history of colonic polyps   . Diabetes mellitus     type II  . GERD (gastroesophageal reflux disease)   . Hyperlipidemia   . Hypertension   . Hypothyroidism   . Osteoarthritis   . Osteopenia   . Urinary incontinence   . Pulmonary hypertension   . Anxiety   . Renal insufficiency   . Coronary artery disease   . Aortic stenosis     Past Surgical History  Procedure Date  . Abdominal hysterectomy   . Tonsillectomy   . Cardiac catheterization 2000    cad  . Replacement total knee bilateral 05/1998  . Cataract extraction   . Coronary artery bypass graft     od  . Adenosine myoview 2007    benign, EF 69%  . Carotid endarterectomy 2011    Right  . Coronary artery bypass graft 2009  . Aortic valve replacement 2009  . Amputation-left great toe 7/12    Dr August Saucer    No family history on file.  History   Social History  . Marital Status: Widowed    Spouse Name: [name]    Number of Children: 1  . Years of Education: [name]   Occupational History  . 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 Smoking status: Former Games developer  . Smokeless tobacco: Not on  file  . Alcohol Use: Yes     Wine occasionally  . Drug Use: No  . Sexually Active: Not on file   Other Topics Concern  . Not on file   Social History Narrative   Retired - [employer], accts receivable    Allergies  Allergen Reactions  . Captopril     REACTION: unspecified  . Enalapril Maleate     REACTION: cough  . Nitrofurantoin     REACTION: itching  . Ramipril     REACTION: unspecified  . Verapamil     REACTION: unspecified    Current Outpatient Prescriptions  Medication Sig Dispense Refill  . ALPRAZolam (XANAX) 0.5 MG tablet Take 0.25 mg by mouth at bedtime as needed.        Marland Kitchen amLODipine (NORVASC) 5 MG tablet Take 1 tablet (5 mg total) by mouth daily.  30 tablet  11  . aspirin 81 MG tablet Take 81 mg by mouth daily.        . cloNIDine (CATAPRES) 0.2 MG tablet Take 3 tablets by mouth daily.       Marland Kitchen estradiol (ESTRACE) 0.5 MG tablet Take 0.5 mg by mouth daily.        . hydrALAZINE (APRESOLINE) 50 MG  tablet Take 50 mg by mouth 3 (three) times daily as needed.        Marland Kitchen levothyroxine (SYNTHROID, LEVOTHROID) 75 MCG tablet Take 75 mcg by mouth daily. 1/2 tab on Mon and Thur       . metoprolol (LOPRESSOR) 50 MG tablet Take 1.5 tablets (75 mg total) by mouth 2 (two) times daily.  90 tablet  11  . Probiotic Product (PROBIOTIC PO) Take 1 capsule by mouth at bedtime.        . simvastatin (ZOCOR) 40 MG tablet TAKE ONE TABLET BY MOUTH EVERY DAY  30 tablet  6    ROS Negative other than HPI.   PE General Appearance: well developed, well nourished in no acute distress HEENT: symmetrical face, PERRLA, good dentition  Neck: no JVD, thyromegaly, or adenopathy, trachea midline Chest: symmetric without deformity Cardiac: PMI non-displaced, RRR, normal S1, S2, no gallop or murmur Lung: clear to ausculation and percussion Vascular: all pulses full without bruits  Abdominal: nondistended, nontender, good bowel sounds, no HSM, no bruits Extremities: no cyanosis, clubbing, 2-3+ pitting  edema bilaterally. no sign of DVT, no varicosities  Skin: normal color, no rashes Neuro: alert and oriented x 3, non-focal Pysch: normal affect Filed Vitals:   08/12/10 1134  BP: 128/78  Pulse: 80  Height: 5\' 3"  (1.6 m)    EKG  Labs and Studies Reviewed.   Lab Results  Component Value Date   WBC 10.8* 07/20/2010   HGB 7.8* 07/20/2010   HCT 23.2* 07/20/2010   MCV 88.9 07/20/2010   PLT 369 07/20/2010      Chemistry      Component Value Date/Time   NA 131* 08/11/2010 1037   K 4.2 08/11/2010 1037   CL 95* 08/11/2010 1037   CO2 23 08/11/2010 1037   BUN 46* 08/11/2010 1037   CREATININE 3.6* 08/11/2010 1037      Component Value Date/Time   CALCIUM 8.3* 08/11/2010 1037   ALKPHOS 108 06/10/2010 0555   AST 16 06/10/2010 0555   ALT 7 06/10/2010 0555   BILITOT 0.8 06/10/2010 0555       Lab Results  Component Value Date   CHOL 166 02/17/2010   CHOL 158 01/28/2009   CHOL 141 07/23/2008   Lab Results  Component Value Date   HDL 61 02/17/2010   HDL 61.10 01/28/2009   HDL 78.46 07/23/2008   Lab Results  Component Value Date   LDLCALC 79 02/17/2010   LDLCALC 71 01/28/2009   LDLCALC 63 07/23/2008   Lab Results  Component Value Date   TRIG 132 02/17/2010   TRIG 131.0 01/28/2009   TRIG 98.0 07/23/2008   Lab Results  Component Value Date   CHOLHDL 2.7 Ratio 02/17/2010   CHOLHDL 3 01/28/2009   CHOLHDL 2 07/23/2008   Lab Results  Component Value Date   HGBA1C 7.9* 07/19/2010   Lab Results  Component Value Date   ALT 7 06/10/2010   AST 16 06/10/2010   ALKPHOS 108 06/10/2010   BILITOT 0.8 06/10/2010   Lab Results  Component Value Date   TSH 0.140* 06/09/2010

## 2010-08-12 NOTE — Assessment & Plan Note (Signed)
Much improved. Check metabolic profile. Her BUN and creatinine are coming down will not admit to the hospital. She is seeing her nephrologist on Friday. I have given her my pager number so she can call me. If she starts to get short of breath, swelling does not go down at night, or she begins to not pass urine regularly she'll let me know. I suspect this was due to a hypertensive crisis from  Med deletion from hospitalization. Now that she's back on amlodipine, I have cut her simvastatin 20 mg per day.

## 2010-08-14 ENCOUNTER — Telehealth: Payer: Self-pay | Admitting: *Deleted

## 2010-08-14 NOTE — Telephone Encounter (Signed)
Received call from [facility] that pt is scheduled for abd ultrasound on Friday and results will not be ready for her visit on the same day. Left message with son to have pt return my call, also tried calling pt and her answering machine wouldn't pick up.

## 2010-08-15 ENCOUNTER — Ambulatory Visit: Payer: Medicare Other | Admitting: Internal Medicine

## 2010-08-15 NOTE — Telephone Encounter (Signed)
Patient changed appt to Monday, 08/18/10

## 2010-08-18 ENCOUNTER — Encounter: Payer: Self-pay | Admitting: Internal Medicine

## 2010-08-18 ENCOUNTER — Ambulatory Visit (INDEPENDENT_AMBULATORY_CARE_PROVIDER_SITE_OTHER): Payer: Medicare Other | Admitting: Internal Medicine

## 2010-08-18 ENCOUNTER — Ambulatory Visit (HOSPITAL_COMMUNITY): Payer: Medicare Other | Attending: Cardiology | Admitting: Radiology

## 2010-08-18 DIAGNOSIS — I10 Essential (primary) hypertension: Secondary | ICD-10-CM

## 2010-08-18 DIAGNOSIS — I771 Stricture of artery: Secondary | ICD-10-CM

## 2010-08-18 DIAGNOSIS — I359 Nonrheumatic aortic valve disorder, unspecified: Secondary | ICD-10-CM

## 2010-08-18 DIAGNOSIS — R609 Edema, unspecified: Secondary | ICD-10-CM | POA: Insufficient documentation

## 2010-08-18 DIAGNOSIS — I2510 Atherosclerotic heart disease of native coronary artery without angina pectoris: Secondary | ICD-10-CM | POA: Insufficient documentation

## 2010-08-18 DIAGNOSIS — N259 Disorder resulting from impaired renal tubular function, unspecified: Secondary | ICD-10-CM

## 2010-08-18 DIAGNOSIS — I059 Rheumatic mitral valve disease, unspecified: Secondary | ICD-10-CM | POA: Insufficient documentation

## 2010-08-18 DIAGNOSIS — I079 Rheumatic tricuspid valve disease, unspecified: Secondary | ICD-10-CM | POA: Insufficient documentation

## 2010-08-18 DIAGNOSIS — Z954 Presence of other heart-valve replacement: Secondary | ICD-10-CM | POA: Insufficient documentation

## 2010-08-18 DIAGNOSIS — Z952 Presence of prosthetic heart valve: Secondary | ICD-10-CM

## 2010-08-18 NOTE — Assessment & Plan Note (Signed)
Ulcer continues to heal slowly  Dr August Saucer is following after the amputation

## 2010-08-18 NOTE — Assessment & Plan Note (Signed)
148/80 by me on right Much better No changes for now

## 2010-08-18 NOTE — Assessment & Plan Note (Signed)
Creat markedly up to ~3.5 Ultrasound looks okay Hopefully better today off the ACEI

## 2010-08-18 NOTE — Progress Notes (Signed)
Subjective:    Patient ID: [name], female    DOB: 02-Sep-1920, 77 y.o.   MRN: [id number]  HPI Here for follow up on renal function and BP Discussed with Dr Daleen Squibb last week Reviewed Dr Val Eagle' note Renal ultrasound showed no obstruction and no apparent scarring  Has had elevation of creatinine up to ~3.5 This is significantly up from her baseline Now off ACEI  BP seems to be okay No headaches No chest pain No SOB  Left foot has been healing some Using new cream to promote healing  Current Outpatient Prescriptions on File Prior to Visit  Medication Sig Dispense Refill  . ALPRAZolam (XANAX) 0.5 MG tablet Take 0.25 mg by mouth at bedtime as needed.        Marland Kitchen amLODipine (NORVASC) 5 MG tablet Take 1 tablet (5 mg total) by mouth daily.  30 tablet  11  . aspirin 81 MG tablet Take 81 mg by mouth daily.        . cloNIDine (CATAPRES) 0.2 MG tablet Take 3 tablets by mouth daily.       Marland Kitchen estradiol (ESTRACE) 0.5 MG tablet Take 0.5 mg by mouth daily.        . hydrALAZINE (APRESOLINE) 50 MG tablet Take 50 mg by mouth 3 (three) times daily as needed.        Marland Kitchen levothyroxine (SYNTHROID, LEVOTHROID) 75 MCG tablet Take 75 mcg by mouth daily. 1/2 tab on Mon and Thur       . metoprolol (LOPRESSOR) 50 MG tablet Take 1.5 tablets (75 mg total) by mouth 2 (two) times daily.  90 tablet  11  . Probiotic Product (PROBIOTIC PO) Take 1 capsule by mouth at bedtime.        . simvastatin (ZOCOR) 20 MG tablet Take 1 tablet (20 mg total) by mouth at bedtime.  30 tablet  11    Allergies  Allergen Reactions  . Captopril     REACTION: unspecified  . Enalapril Maleate     REACTION: cough  . Nitrofurantoin     REACTION: itching  . Ramipril     REACTION: unspecified  . Verapamil     REACTION: unspecified    Past Medical History  Diagnosis Date  . Anemia     NOS  . Personal history of colonic polyps   . Diabetes mellitus     type II  . GERD (gastroesophageal reflux disease)   . Hyperlipidemia     . Hypertension   . Hypothyroidism   . Osteoarthritis   . Osteopenia   . Urinary incontinence   . Pulmonary hypertension   . Anxiety   . Renal insufficiency   . Coronary artery disease   . Aortic stenosis     Past Surgical History  Procedure Date  . Abdominal hysterectomy   . Tonsillectomy   . Cardiac catheterization 2000    cad  . Replacement total knee bilateral 05/1998  . Cataract extraction   . Coronary artery bypass graft     od  . Adenosine myoview 2007    benign, EF 69%  . Carotid endarterectomy 2011    Right  . Coronary artery bypass graft 2009  . Aortic valve replacement 2009  . Amputation-left great toe 7/12    Dr August Saucer    No family history on file.  History   Social History  . Marital Status: Widowed    Spouse Name: [name]    Number of Children: 1  . Years of Education:  [name]   Occupational History  . 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 Smoking status: Former Games developer  . Smokeless tobacco: Never Used  . Alcohol Use: Yes     Wine occasionally  . Drug Use: No  . Sexually Active: Not on file   Other Topics Concern  . Not on file   Social History Narrative   Retired - Engineer, petroleum, accts receivable   Review of Systems Appetite is okay Sleeps okay most of the time (and naps if needed) Occ feels skip in heart     Objective:   Physical Exam  Constitutional: She appears well-developed and well-nourished. No distress.  Neck: Normal range of motion. Neck supple. No thyromegaly present.  Cardiovascular: Normal rate and regular rhythm.  Exam reveals no gallop.   Murmur heard.      Gr 2/6 systolic murmur  Pulmonary/Chest: Effort normal and breath sounds normal. No respiratory distress. She has no wheezes. She has no rales.  Musculoskeletal: She exhibits no edema.  Lymphadenopathy:    She has no cervical adenopathy.  Skin:       Left foot ulcer at amputation site is granulating and much improved  Psychiatric: She  has a normal mood and affect. Her behavior is normal. Judgment and thought content normal.          Assessment & Plan:

## 2010-08-19 ENCOUNTER — Ambulatory Visit: Payer: Medicare Other | Admitting: Internal Medicine

## 2010-08-19 ENCOUNTER — Telehealth: Payer: Self-pay | Admitting: Radiology

## 2010-08-19 LAB — BASIC METABOLIC PANEL
BUN: 47 mg/dL — ABNORMAL HIGH (ref 6–23)
CO2: 24 mEq/L (ref 19–32)
Chloride: 102 mEq/L (ref 96–112)
Potassium: 4.5 mEq/L (ref 3.5–5.1)

## 2010-08-19 NOTE — Telephone Encounter (Signed)
[facility] called a critical GFR result of 11.8.

## 2010-08-19 NOTE — Telephone Encounter (Signed)
I saw Will send to other doctors Disappointing that it didn't come down some

## 2010-08-19 NOTE — Telephone Encounter (Signed)
Reviewed blood work and recent note from cards and PCP.   Cr stable from last week when checked, remaining elevated at 3.8. May wait for PCP to return tomorrow.  Will route to him.

## 2010-08-28 ENCOUNTER — Ambulatory Visit: Payer: Medicare Other | Admitting: Cardiology

## 2010-08-28 ENCOUNTER — Other Ambulatory Visit: Payer: Self-pay | Admitting: *Deleted

## 2010-08-29 ENCOUNTER — Other Ambulatory Visit: Payer: Self-pay | Admitting: *Deleted

## 2010-08-29 NOTE — Telephone Encounter (Signed)
Pt is requesting a refill on her iron 324 mg's.  This is not on her med list, it was given to her by Dr Levora Angel, when she was in hospital at [facility].  Uses [facility] [address].

## 2010-08-29 NOTE — Telephone Encounter (Signed)
Chart opened in error

## 2010-08-29 NOTE — Telephone Encounter (Signed)
Probably better to take on OTC iron pill They are not quite as strong but overall are tolerated better

## 2010-08-29 NOTE — Telephone Encounter (Signed)
Spoke with patient and advised results   

## 2010-09-17 NOTE — Discharge Summary (Signed)
  NAMETAMARRA, GEISELMAN NO.:  [id number]  MEDICAL RECORD NO.:  [id number]  LOCATION:  5004                         FACILITY:  [facility]  PHYSICIAN:  Burnard Bunting, M.D.    DATE OF BIRTH:  1920/02/04  DATE OF ADMISSION:  07/18/2010 DATE OF DISCHARGE:  07/20/2010                              DISCHARGE SUMMARY   DISCHARGE DIAGNOSIS:  Left great toe infection.  SECONDARY DIAGNOSES:  Diabetes and peripheral vascular disease.  OPERATION AND NOTABLE PROCEDURES:  Left great toe ray amputation performed July 18, 2010.  HOSPITAL COURSE:  [name] is a patient with a left great toe infection.  She was admitted to the Orthopedic Service July 18, 2010, underwent ray amputation at that time, seen by [facility] in consultation.  She was on IV antibiotics during her hospitalization, had an unremarkable recovery and was mobilizing well and walking well in the hall at this time of discharge.  She is discharged home in good condition July 20, 2010.  Dressing is dry and intact.  DISCHARGE MEDICATIONS: 1. Synthroid daily. 2. Metoprolol 50 mg twice daily. 3. Zocor 40 mg at bedtime. 4. Xanax 0.25 mg twice daily as needed. 5. Hydrochlorothiazide 25 mg daily. 6. Estrace 1 tab daily. 7. Enalapril 10 mg daily. 8. Clonidine 0.2 mg 3 times a day. 9. Enteric-coated aspirin 81 mg p.o. daily. 10.Doxycycline 100 mg p.o. twice a day for 7 days.  She will follow up with me in 10 days, weightbearing as tolerated.     Burnard Bunting, M.D.     GSD/MEDQ  D:  08/07/2010  T:  08/07/2010  Job:  [id number]  Electronically Signed by Reece Agar.  DEAN M.D. on 09/17/2010 08:27:56 AM

## 2010-09-25 ENCOUNTER — Ambulatory Visit: Payer: Medicare Other | Admitting: Internal Medicine

## 2010-09-25 LAB — CBC
HCT: 27.5 — ABNORMAL LOW
HCT: 27.6 — ABNORMAL LOW
HCT: 28.4 — ABNORMAL LOW
HCT: 31.1 — ABNORMAL LOW
Hemoglobin: 10.8 — ABNORMAL LOW
Hemoglobin: 11.1 — ABNORMAL LOW
Hemoglobin: 9.5 — ABNORMAL LOW
Hemoglobin: 9.6 — ABNORMAL LOW
Hemoglobin: 9.8 — ABNORMAL LOW
MCHC: 34.2
MCHC: 34.4
MCHC: 34.5
MCHC: 34.5
MCHC: 34.6
MCHC: 35.1
MCV: 91.7
MCV: 92.6
MCV: 94.1
MCV: 96.4
Platelets: 135 — ABNORMAL LOW
Platelets: 85 — ABNORMAL LOW
RBC: 2.77 — ABNORMAL LOW
RBC: 2.98 — ABNORMAL LOW
RBC: 3 — ABNORMAL LOW
RBC: 3.22 — ABNORMAL LOW
RBC: 3.31 — ABNORMAL LOW
RBC: 3.31 — ABNORMAL LOW
RDW: 12.8
RDW: 15.2
RDW: 15.4
RDW: 15.7 — ABNORMAL HIGH
WBC: 10.3
WBC: 11.5 — ABNORMAL HIGH
WBC: 13.7 — ABNORMAL HIGH
WBC: 9.4

## 2010-09-25 LAB — I-STAT 8, (EC8 V) (CONVERTED LAB)
BUN: 19
Glucose, Bld: 115 — ABNORMAL HIGH
HCT: 33 — ABNORMAL LOW
Hemoglobin: 11.2 — ABNORMAL LOW
Operator id: 270651
Potassium: 4
Sodium: 138

## 2010-09-25 LAB — I-STAT EC8
BUN: 21
BUN: 23
Bicarbonate: 24.3 — ABNORMAL HIGH
Glucose, Bld: 163 — ABNORMAL HIGH
HCT: 30 — ABNORMAL LOW
Hemoglobin: 10.2 — ABNORMAL LOW
Hemoglobin: 9.2 — ABNORMAL LOW
Operator id: 193041
Potassium: 4
Potassium: 5.9 — ABNORMAL HIGH
Sodium: 134 — ABNORMAL LOW
Sodium: 137
TCO2: 22
pH, Arterial: 7.471 — ABNORMAL HIGH

## 2010-09-25 LAB — POCT I-STAT 3, VENOUS BLOOD GAS (G3P V)
Acid-base deficit: 2
Acid-base deficit: 2
Bicarbonate: 24
Bicarbonate: 24.6 — ABNORMAL HIGH
O2 Saturation: 61
O2 Saturation: 64
Operator id: 194801
TCO2: 25
TCO2: 26
pCO2, Ven: 47.5
pH, Ven: 7.312 — ABNORMAL HIGH
pO2, Ven: 35
pO2, Ven: 37

## 2010-09-25 LAB — TYPE AND SCREEN

## 2010-09-25 LAB — CREATININE, SERUM
Creatinine, Ser: 1.26 — ABNORMAL HIGH
Creatinine, Ser: 1.56 — ABNORMAL HIGH
GFR calc Af Amer: 38 — ABNORMAL LOW
GFR calc non Af Amer: 32 — ABNORMAL LOW
GFR calc non Af Amer: 40 — ABNORMAL LOW

## 2010-09-25 LAB — URINALYSIS, ROUTINE W REFLEX MICROSCOPIC
Bilirubin Urine: NEGATIVE
Glucose, UA: NEGATIVE
Glucose, UA: NEGATIVE
Hgb urine dipstick: NEGATIVE
Hgb urine dipstick: NEGATIVE
Specific Gravity, Urine: 1.008
Specific Gravity, Urine: 1.009
pH: 6.5
pH: 7

## 2010-09-25 LAB — BASIC METABOLIC PANEL
BUN: 24 — ABNORMAL HIGH
BUN: 26 — ABNORMAL HIGH
CO2: 26
CO2: 27
CO2: 30
Calcium: 7.5 — ABNORMAL LOW
Calcium: 7.9 — ABNORMAL LOW
Calcium: 8.1 — ABNORMAL LOW
Chloride: 101
Chloride: 103
Chloride: 104
Chloride: 104
Creatinine, Ser: 1.23 — ABNORMAL HIGH
Creatinine, Ser: 1.47 — ABNORMAL HIGH
Creatinine, Ser: 1.67 — ABNORMAL HIGH
GFR calc Af Amer: 33 — ABNORMAL LOW
GFR calc Af Amer: 35 — ABNORMAL LOW
GFR calc Af Amer: 45 — ABNORMAL LOW
GFR calc Af Amer: 50 — ABNORMAL LOW
GFR calc non Af Amer: 29 — ABNORMAL LOW
GFR calc non Af Amer: 37 — ABNORMAL LOW
GFR calc non Af Amer: 38 — ABNORMAL LOW
Glucose, Bld: 143 — ABNORMAL HIGH
Glucose, Bld: 95
Potassium: 3.5
Potassium: 3.9
Potassium: 3.9
Potassium: 4.1
Sodium: 138
Sodium: 139
Sodium: 140
Sodium: 141

## 2010-09-25 LAB — POCT I-STAT 4, (NA,K, GLUC, HGB,HCT)
Glucose, Bld: 123 — ABNORMAL HIGH
Glucose, Bld: 144 — ABNORMAL HIGH
Glucose, Bld: 148 — ABNORMAL HIGH
HCT: 21 — ABNORMAL LOW
HCT: 25 — ABNORMAL LOW
HCT: 28 — ABNORMAL LOW
Hemoglobin: 8.5 — ABNORMAL LOW
Hemoglobin: 9.5 — ABNORMAL LOW
Operator id: 299391
Operator id: 3402
Operator id: 3402
Potassium: 4
Potassium: 4.3
Potassium: 6.4
Potassium: 6.6
Sodium: 130 — ABNORMAL LOW
Sodium: 137
Sodium: 138

## 2010-09-25 LAB — PROTIME-INR
INR: 1
Prothrombin Time: 13.3

## 2010-09-25 LAB — POCT I-STAT 3, ART BLOOD GAS (G3+)
Acid-base deficit: 3 — ABNORMAL HIGH
Bicarbonate: 23.1
Bicarbonate: 24.7 — ABNORMAL HIGH
Operator id: 194801
Operator id: 299391
Operator id: 3402
TCO2: 24
TCO2: 25
TCO2: 26
pCO2 arterial: 28.7 — ABNORMAL LOW
pCO2 arterial: 35.5
pH, Arterial: 7.375
pH, Arterial: 7.288 — ABNORMAL LOW
pH, Arterial: 7.392
pH, Arterial: 7.443 — ABNORMAL HIGH
pO2, Arterial: 83
pO2, Arterial: 140 — ABNORMAL HIGH

## 2010-09-25 LAB — PREPARE FRESH FROZEN PLASMA

## 2010-09-25 LAB — POTASSIUM: Potassium: 6 — ABNORMAL HIGH

## 2010-09-25 LAB — HEMOGLOBIN A1C
Hgb A1c MFr Bld: 6.5 — ABNORMAL HIGH
Mean Plasma Glucose: 154

## 2010-09-25 LAB — URINE MICROSCOPIC-ADD ON

## 2010-09-25 LAB — COMPREHENSIVE METABOLIC PANEL
BUN: 29 — ABNORMAL HIGH
CO2: 22
Calcium: 9.4
Creatinine, Ser: 1.25 — ABNORMAL HIGH
GFR calc non Af Amer: 41 — ABNORMAL LOW
Glucose, Bld: 99

## 2010-09-25 LAB — BLOOD GAS, ARTERIAL
Acid-base deficit: 1
Bicarbonate: 23.4
O2 Saturation: 98.7
Patient temperature: 98.6
pO2, Arterial: 118 — ABNORMAL HIGH

## 2010-09-25 LAB — PREPARE PLATELET PHERESIS

## 2010-09-25 LAB — HEMOGLOBIN AND HEMATOCRIT, BLOOD
HCT: 26.2 — ABNORMAL LOW
Hemoglobin: 9.1 — ABNORMAL LOW

## 2010-09-25 LAB — PLATELET COUNT: Platelets: 86 — ABNORMAL LOW

## 2010-09-29 ENCOUNTER — Encounter: Payer: Self-pay | Admitting: Internal Medicine

## 2010-09-29 ENCOUNTER — Ambulatory Visit (INDEPENDENT_AMBULATORY_CARE_PROVIDER_SITE_OTHER): Payer: Medicare Other | Admitting: Internal Medicine

## 2010-09-29 DIAGNOSIS — N259 Disorder resulting from impaired renal tubular function, unspecified: Secondary | ICD-10-CM

## 2010-09-29 DIAGNOSIS — I10 Essential (primary) hypertension: Secondary | ICD-10-CM

## 2010-09-29 DIAGNOSIS — E119 Type 2 diabetes mellitus without complications: Secondary | ICD-10-CM

## 2010-09-29 NOTE — Assessment & Plan Note (Signed)
Lab Results  Component Value Date   HGBA1C 7.9* 07/19/2010   Fair control No changes needed Controlled with diet

## 2010-09-29 NOTE — Progress Notes (Signed)
Subjective:    Patient ID: [name], female    DOB: 1920/06/17, 77 y.o.   MRN: [id number]  HPI Doing well Left foot is healing well---"you won't believe it"  BP still up and down No headaches  No chest pain No SOB  Fell at [facility] in [place] a week ago Still some soreness but no sig injury  Sugars have been fine Usually around 140 No hypoglycemia  Current Outpatient Prescriptions on File Prior to Visit  Medication Sig Dispense Refill  . amLODipine (NORVASC) 5 MG tablet Take 1 tablet (5 mg total) by mouth daily.  30 tablet  11  . aspirin 81 MG tablet Take 81 mg by mouth daily.        . cloNIDine (CATAPRES) 0.2 MG tablet Take 0.2 mg by mouth 3 (three) times daily.       Marland Kitchen estradiol (ESTRACE) 0.5 MG tablet Take 0.5 mg by mouth daily.        Marland Kitchen levothyroxine (SYNTHROID, LEVOTHROID) 75 MCG tablet Take 75 mcg by mouth daily. 1/2 tab on Mon and Thur       . metoprolol (LOPRESSOR) 50 MG tablet Take 1.5 tablets (75 mg total) by mouth 2 (two) times daily.  90 tablet  11  . Probiotic Product (PROBIOTIC PO) Take 1 capsule by mouth at bedtime.        . simvastatin (ZOCOR) 20 MG tablet Take 1 tablet (20 mg total) by mouth at bedtime.  30 tablet  11  . ALPRAZolam (XANAX) 0.5 MG tablet Take 0.25 mg by mouth at bedtime as needed.        . hydrALAZINE (APRESOLINE) 50 MG tablet Take 50 mg by mouth 3 (three) times daily as needed.          Allergies  Allergen Reactions  . Captopril     REACTION: unspecified  . Enalapril Maleate     REACTION: cough  . Nitrofurantoin     REACTION: itching  . Ramipril     REACTION: unspecified  . Verapamil     REACTION: unspecified    Past Medical History  Diagnosis Date  . Anemia     NOS  . Personal history of colonic polyps   . Diabetes mellitus     type II  . GERD (gastroesophageal reflux disease)   . Hyperlipidemia   . Hypertension   . Hypothyroidism   . Osteoarthritis   . Osteopenia   . Urinary incontinence   . Pulmonary hypertension     . Anxiety   . Renal insufficiency   . Coronary artery disease   . Aortic stenosis     Past Surgical History  Procedure Date  . Abdominal hysterectomy   . Tonsillectomy   . Cardiac catheterization 2000    cad  . Replacement total knee bilateral 05/1998  . Cataract extraction   . Coronary artery bypass graft     od  . Adenosine myoview 2007    benign, EF 69%  . Carotid endarterectomy 2011    Right  . Coronary artery bypass graft 2009  . Aortic valve replacement 2009  . Amputation-left great toe 7/12    Dr August Saucer    No family history on file.  History   Social History  . Marital Status: Widowed    Spouse Name: [name]    Number of Children: 1  . Years of Education: [name]   Occupational History  . 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  Smoking status: Former Games developer  . Smokeless tobacco: Never Used  . Alcohol Use: Yes     Wine occasionally  . Drug Use: No  . Sexually Active: Not on file   Other Topics Concern  . Not on file   Social History Narrative   Retired - Engineer, petroleum, accts receivable   Review of Systems No problems with appetite No itching now Sleeps okay     Objective:   Physical Exam  Constitutional: She appears well-developed and well-nourished. No distress.  Neck: Normal range of motion. No thyromegaly present.  Cardiovascular: Normal rate and regular rhythm.  Exam reveals no gallop.   Murmur heard.      Soft systolic murmur  Musculoskeletal: She exhibits edema. She exhibits no tenderness.       1+ edema in calves and feet Left foot healed at surgical site---just slight eschar just along incision line  Lymphadenopathy:    She has no cervical adenopathy.  Psychiatric: She has a normal mood and affect. Her behavior is normal. Judgment and thought content normal.          Assessment & Plan:

## 2010-09-29 NOTE — Assessment & Plan Note (Signed)
Creat has been up at 3.5 -3.8 No uremic symptoms Will recheck

## 2010-09-29 NOTE — Assessment & Plan Note (Signed)
BP Readings from Last 3 Encounters:  09/29/10 160/78  08/18/10 148/60  08/12/10 128/78   Highly variable but fairly good for her Would probably increase the clonidine if stays up

## 2010-09-30 LAB — BASIC METABOLIC PANEL
Chloride: 102 mEq/L (ref 96–112)
Creatinine, Ser: 3.6 mg/dL — ABNORMAL HIGH (ref 0.4–1.2)
GFR: 12.58 mL/min — CL (ref >60.00)
Potassium: 4.5 mEq/L (ref 3.5–5.1)

## 2010-10-02 ENCOUNTER — Telehealth: Payer: Self-pay | Admitting: *Deleted

## 2010-10-02 NOTE — Telephone Encounter (Signed)
Message copied by Sueanne Margarita on Thu Oct 02, 2010 10:50 AM ------      Message from: Tillman Abide I      Created: Wed Oct 01, 2010  8:01 AM       Please call      Kidney tests are stable      No changes for now            Copy to Dr Val Eagle

## 2010-10-02 NOTE — Telephone Encounter (Signed)
.  left message to have patient return my call.  

## 2010-10-03 NOTE — Telephone Encounter (Signed)
Spoke with patient and advised results   

## 2010-10-16 NOTE — Consult Note (Signed)
NAMEIQRA, ROTUNDO NO.:  [id number]  MEDICAL RECORD NO.:  [id number]  LOCATION:                                 FACILITY:  PHYSICIAN:  Mick Sell, MD DATE OF BIRTH:  Apr 21, 1920  DATE OF CONSULTATION: DATE OF DISCHARGE:                                CONSULTATION   REQUESTING PHYSICIAN:  Burnard Bunting, MD in Orthopedics.  PRIMARY CARE DOCTOR:  [name], MD.  PRIMARY CARDIOLOGIST:  Jesse Sans. Wall, MD, [facility].  REASON FOR CONSULTATION:  Blood pressure, diabetes, anemia workup and management.  HISTORY OF PRESENT ILLNESS:  This is a very pleasant 77 year old female with history of hypertension, coronary artery disease, aortic stenosis, status post CABG in 2009, diabetes, who has been battling an MRSA foot infection, underwent surgery today by Dr. August Saucer.  We are consulted to help manage her hypertension, diabetes, and to workup her anemia.  In the PACU, prior to surgery today, she was noted to be markedly hypertensive; however, she had not taken her blood pressure medicines this morning as she was n.p.o.  At that time, her blood pressure was 223/81 with a heart rate of 81.  She was given her clonidine and metoprolol and her blood pressure improved.  She successfully underwent the surgery and I am seeing her now postop.  The patient denies any complaints, except issues with her foot.  She denies any chest pain, shortness of breath, swelling, palpitations, fevers, chills, or night sweats.  PAST MEDICAL HISTORY: 1. Coronary artery disease/aortic stenosis.  She underwent valve     surgery in 2009 as well as CABG at that time.  She is followed by     Dr. Daleen Squibb. 2. Hypertension. 3. Diabetes. 4. Chronic kidney disease, creatinine around 1.3. 5. History of anemia. 6. History of hyperlipidemia. 7. Hypothyroidism. 8. Urinary incontinence. 9. Bilateral total knee replacement. 10.Carotid artery stenosis status post carotid  endarterectomy. 11.History of recent diabetic foot abscess with MRSA on culture,     status post I and D.  SOCIAL HISTORY:  The patient lives alone, but her son is very involved in her care.  She does not smoke or drink alcohol.  She is full code.  FAMILY HISTORY:  Noncontributory.  MEDICATIONS:  Somewhat confusing as some of her meds were different than her discharge medication manager from June. 1. Aspirin 81 mg once a day. 2. Clonidine 0.1 mg 1 tablet TTS. 3. Enalapril 10 mg twice a day. 4. Estrace 0.5 mg once a day. 5. Glipizide 5 mg twice a day.  This actually of note has been     discontinued by Dr. Alphonsus Sias per the patient. 6. Hydrochlorothiazide 25 once a day. 7. Levothroid 75 mcg once a day. 8. Metoprolol 50 mg one and a half tablet twice a day. 9. Oxybutynin 5 mg once a day.  ALLERGIES:  The patient is allergic to SULFA, NITROFURANTOIN, CEPHALOSPORINS.  PHYSICAL EXAMINATION:  GENERAL:  The patient is pleasant female, in no acute distress. VITAL SIGNS:  Vitals from the PACU included temperature 98.3, pulse of 81, blood pressure 223/81, respirations 20, sat of 98% on room air. HEENT:  Pupils are  equal, round, reactive to light.  Oropharynx is clear. NECK:  Supple. HEART:  Regular, but with a 2/6 systolic murmur.  She does have JVD to her mid neck. LUNGS:  Clear. ABDOMEN:  Soft, nontender, nondistended. EXTREMITIES:  She has 1+ edema on the left lower extremity which is wrapped postoperatively.  She has trace edema in her right lower extremity. NEURO:  She is alert and oriented.  Grossly nonfocal neuro exam.  DATA:  White blood count 14.8, hemoglobin 7.5, MCV 89, platelets 432, neutrophil 12.0.  INR 1.1.  Sodium 135, potassium 4.1, chloride 98, CO2 24, BUN 26, creatinine 1.44, glucose 146.  Gram stain from the wound done today shows a few gram-positive cocci in pairs.  Chest x-ray, cardiac enlargement, but no pleural effusions, no active  cardiopulmonary disease.  IMPRESSION: 1. Hypertension. 2. History of coronary artery disease and aortic stenosis status post     coronary artery bypass graft and tissue valve replacement in 2009. 3. Diabetes. 4. Anemia. 5. Hypothyroidism. 6. MRSA foot infection status post surgery by Dr. August Saucer.  RECOMMENDATIONS: 1. For her blood pressure, we will continue her on her clonidine,     hydrochlorothiazide, metoprolol, and enalapril.  I would recommend     checking a BMET in the morning as well as a BNP.  She will be     receiving packed red blood cells overnight.  We will closely     monitor her for volume overload and in case she needs a Lasix. 2. Anemia.  This is normocytic and has been as low 7 in the past     several months.  It is likely anemia of chronic disease, especially     with her elevated platelets as an acute phase reaction.  I would     recommend checking an anemia panel and ESR and Heme checking her     stools.  This can likely be further worked up as an outpatient. 3. Diabetes.  She currently is no longer on glipizide as Dr. Alphonsus Sias     took it off several months ago.  Her A1c was less than 6.5 when     last checked several months ago.  We will continue her on Accu-    Cheks and sliding scale insulin and repeat an A1c. 4. MRSA foot infection, Dr. August Saucer is the surgery and recommend     continued antibiotics. 5. If she progresses well and recovers postoperatively, she will be     able to be discharged home with outpatient follow-up.     Mick Sell, MD     DPF/MEDQ  D:  07/18/2010  T:  07/19/2010  Job:  [id number]  Electronically Signed by Virginia Rochester M.D. on 10/16/2010 10:35:58 AM

## 2010-10-23 ENCOUNTER — Telehealth: Payer: Self-pay | Admitting: *Deleted

## 2010-10-23 NOTE — Telephone Encounter (Signed)
Form for diabetic shoes is on your desk.  I spoke with the patient and she does want the shoes.

## 2010-10-24 NOTE — Telephone Encounter (Signed)
Form done No charge 

## 2010-10-24 NOTE — Telephone Encounter (Signed)
Form faxed to [facility].

## 2010-11-21 ENCOUNTER — Emergency Department (HOSPITAL_COMMUNITY): Payer: Medicare Other

## 2010-11-21 ENCOUNTER — Inpatient Hospital Stay (HOSPITAL_COMMUNITY)
Admission: EM | Admit: 2010-11-21 | Discharge: 2010-11-28 | DRG: 683 | Disposition: A | Discharge status: Home Health | Payer: Medicare Other | Attending: Internal Medicine | Admitting: Internal Medicine

## 2010-11-21 ENCOUNTER — Other Ambulatory Visit: Payer: Self-pay

## 2010-11-21 ENCOUNTER — Encounter (HOSPITAL_COMMUNITY): Payer: Self-pay | Admitting: Emergency Medicine

## 2010-11-21 ENCOUNTER — Telehealth: Payer: Self-pay | Admitting: Internal Medicine

## 2010-11-21 DIAGNOSIS — E1169 Type 2 diabetes mellitus with other specified complication: Secondary | ICD-10-CM | POA: Diagnosis not present

## 2010-11-21 DIAGNOSIS — S98119A Complete traumatic amputation of unspecified great toe, initial encounter: Secondary | ICD-10-CM

## 2010-11-21 DIAGNOSIS — Z888 Allergy status to other drugs, medicaments and biological substances status: Secondary | ICD-10-CM

## 2010-11-21 DIAGNOSIS — Z79899 Other long term (current) drug therapy: Secondary | ICD-10-CM

## 2010-11-21 DIAGNOSIS — F411 Generalized anxiety disorder: Secondary | ICD-10-CM | POA: Diagnosis present

## 2010-11-21 DIAGNOSIS — Z66 Do not resuscitate: Secondary | ICD-10-CM | POA: Diagnosis present

## 2010-11-21 DIAGNOSIS — I10 Essential (primary) hypertension: Secondary | ICD-10-CM

## 2010-11-21 DIAGNOSIS — Z951 Presence of aortocoronary bypass graft: Secondary | ICD-10-CM

## 2010-11-21 DIAGNOSIS — Z860100 Personal history of colon polyps, unspecified: Secondary | ICD-10-CM

## 2010-11-21 DIAGNOSIS — Z7982 Long term (current) use of aspirin: Secondary | ICD-10-CM

## 2010-11-21 DIAGNOSIS — I2510 Atherosclerotic heart disease of native coronary artery without angina pectoris: Secondary | ICD-10-CM

## 2010-11-21 DIAGNOSIS — N390 Urinary tract infection, site not specified: Secondary | ICD-10-CM

## 2010-11-21 DIAGNOSIS — D631 Anemia in chronic kidney disease: Secondary | ICD-10-CM | POA: Diagnosis present

## 2010-11-21 DIAGNOSIS — Z882 Allergy status to sulfonamides status: Secondary | ICD-10-CM

## 2010-11-21 DIAGNOSIS — I129 Hypertensive chronic kidney disease with stage 1 through stage 4 chronic kidney disease, or unspecified chronic kidney disease: Secondary | ICD-10-CM | POA: Diagnosis present

## 2010-11-21 DIAGNOSIS — Z952 Presence of prosthetic heart valve: Secondary | ICD-10-CM

## 2010-11-21 DIAGNOSIS — E1129 Type 2 diabetes mellitus with other diabetic kidney complication: Secondary | ICD-10-CM | POA: Diagnosis present

## 2010-11-21 DIAGNOSIS — K5900 Constipation, unspecified: Secondary | ICD-10-CM | POA: Diagnosis not present

## 2010-11-21 DIAGNOSIS — K219 Gastro-esophageal reflux disease without esophagitis: Secondary | ICD-10-CM

## 2010-11-21 DIAGNOSIS — N184 Chronic kidney disease, stage 4 (severe): Secondary | ICD-10-CM | POA: Diagnosis present

## 2010-11-21 DIAGNOSIS — IMO0002 Reserved for concepts with insufficient information to code with codable children: Secondary | ICD-10-CM

## 2010-11-21 DIAGNOSIS — E039 Hypothyroidism, unspecified: Secondary | ICD-10-CM

## 2010-11-21 DIAGNOSIS — Z96659 Presence of unspecified artificial knee joint: Secondary | ICD-10-CM

## 2010-11-21 DIAGNOSIS — N19 Unspecified kidney failure: Secondary | ICD-10-CM

## 2010-11-21 DIAGNOSIS — N289 Disorder of kidney and ureter, unspecified: Secondary | ICD-10-CM

## 2010-11-21 DIAGNOSIS — D649 Anemia, unspecified: Secondary | ICD-10-CM

## 2010-11-21 DIAGNOSIS — E785 Hyperlipidemia, unspecified: Secondary | ICD-10-CM

## 2010-11-21 DIAGNOSIS — Z87891 Personal history of nicotine dependence: Secondary | ICD-10-CM

## 2010-11-21 DIAGNOSIS — N179 Acute kidney failure, unspecified: Principal | ICD-10-CM | POA: Diagnosis present

## 2010-11-21 LAB — URINE MICROSCOPIC-ADD ON

## 2010-11-21 LAB — URINALYSIS, ROUTINE W REFLEX MICROSCOPIC
Bilirubin Urine: NEGATIVE
Nitrite: NEGATIVE
Specific Gravity, Urine: 1.013 (ref 1.005–1.030)
Urobilinogen, UA: 0.2 mg/dL (ref 0.0–1.0)
pH: 6 (ref 5.0–8.0)

## 2010-11-21 LAB — CBC
HCT: 22.2 % — ABNORMAL LOW (ref 36.0–46.0)
MCH: 29.1 pg (ref 26.0–34.0)
MCHC: 33.8 g/dL (ref 30.0–36.0)
MCV: 86 fL (ref 78.0–100.0)
Platelets: 255 10*3/uL (ref 150–400)
RDW: 13.1 % (ref 11.5–15.5)
WBC: 10.3 10*3/uL (ref 4.0–10.5)

## 2010-11-21 LAB — COMPREHENSIVE METABOLIC PANEL
ALT: <5 U/L (ref 0–35)
AST: 10 U/L (ref 0–37)
CO2: 17 mEq/L — ABNORMAL LOW (ref 19–32)
Calcium: 8.2 mg/dL — ABNORMAL LOW (ref 8.4–10.5)
Creatinine, Ser: 7.7 mg/dL — ABNORMAL HIGH (ref 0.50–1.10)
GFR calc Af Amer: 5 mL/min — ABNORMAL LOW (ref >90)
GFR calc non Af Amer: 4 mL/min — ABNORMAL LOW (ref >90)
Sodium: 131 mEq/L — ABNORMAL LOW (ref 135–145)
Total Protein: 9.4 g/dL — ABNORMAL HIGH (ref 6.0–8.3)

## 2010-11-21 LAB — DIFFERENTIAL
Basophils Absolute: 0.1 10*3/uL (ref 0.0–0.1)
Eosinophils Absolute: 0.4 10*3/uL (ref 0.0–0.7)
Eosinophils Relative: 4 % (ref 0–5)
Lymphocytes Relative: 11 % — ABNORMAL LOW (ref 12–46)
Monocytes Absolute: 1.2 10*3/uL — ABNORMAL HIGH (ref 0.1–1.0)

## 2010-11-21 MED ORDER — SODIUM CHLORIDE 0.9 % IV SOLN
INTRAVENOUS | Status: DC
  Administered 2010-11-21: 21:00:00 via INTRAVENOUS

## 2010-11-21 NOTE — ED Provider Notes (Signed)
History     CSN: [id number] Arrival date & time: 11/21/2010  7:40 PM   First MD Initiated Contact with Patient 11/21/10 2012      Chief Complaint  Patient presents with  . Urinary Tract Infection    (Consider location/radiation/quality/duration/timing/severity/associated sxs/prior treatment) HPI Comments: The patient is an 77 year old woman who complains "I'm cold." She had seen the urologist yesterday, who had treated her for urinary tract infection with Cipro. She had laboratory tests done then. She was called today and was told that one of her laboratory tests were so abnormal that she needed to go to the hospital. The result was 8.6; it may have been her creatinine.  Patient is a 77 y.o. female presenting with frequency.  Urinary Frequency This is a new problem. The current episode started yesterday. The problem occurs constantly. The problem has been gradually improving. The symptoms are aggravated by nothing. The symptoms are relieved by medications (She has taken the antibiotic Cipro with some relief.). Treatments tried: She was prescribed Cipro by her urologist yesterday.    Past Medical History  Diagnosis Date  . Anemia     NOS  . Personal history of colonic polyps   . Diabetes mellitus     type II  . GERD (gastroesophageal reflux disease)   . Hyperlipidemia   . Hypertension   . Hypothyroidism   . Osteoarthritis   . Osteopenia   . Urinary incontinence   . Pulmonary hypertension   . Anxiety   . Renal insufficiency   . Coronary artery disease   . Aortic stenosis     Past Surgical History  Procedure Date  . Abdominal hysterectomy   . Tonsillectomy   . Cardiac catheterization 2000    cad  . Replacement total knee bilateral 05/1998  . Cataract extraction   . Coronary artery bypass graft     od  . Adenosine myoview 2007    benign, EF 69%  . Carotid endarterectomy 2011    Right  . Coronary artery bypass graft 2009  . Aortic valve replacement 2009  .  Amputation-left great toe 7/12    Dr August Saucer    History reviewed. No pertinent family history.  History  Substance Use Topics  . Smoking status: Former Games developer  . Smokeless tobacco: Never Used  . Alcohol Use: Yes     Wine occasionally    OB History    Grav Para Term Preterm Abortions TAB SAB Ect Mult Living                  Review of Systems  Constitutional: Positive for chills.  Eyes: Negative.   Respiratory: Negative.   Cardiovascular: Negative.   Gastrointestinal: Negative.   Genitourinary: Positive for dysuria and frequency.  Musculoskeletal: Negative.   Skin: Negative.   Neurological: Negative.   Psychiatric/Behavioral: Negative.     Allergies  Captopril; Enalapril maleate; Nitrofurantoin; Ramipril; Sulfa antibiotics; and Verapamil  Home Medications   Current Outpatient Rx  Name Route Sig Dispense Refill  . ALPRAZOLAM 0.5 MG PO TABS Oral Take 0.25 mg by mouth at bedtime as needed. For anxiety    . AMLODIPINE BESYLATE 5 MG PO TABS Oral Take 1 tablet (5 mg total) by mouth daily. 30 tablet 11  . VITAMIN C 500 MG PO CHEW Oral Chew 1 tablet by mouth daily.      . ASPIRIN 81 MG PO TABS Oral Take 81 mg by mouth daily.      Marland Kitchen CIPROFLOXACIN HCL 250 MG  PO TABS Oral Take 250 mg by mouth every other day. For 3 days (last dose is 11/22/2010)     . CLONIDINE HCL 0.2 MG PO TABS Oral Take 0.2 mg by mouth 3 (three) times daily.     Marland Kitchen ESTRADIOL 0.5 MG PO TABS Oral Take 0.5 mg by mouth daily.      Marland Kitchen FERROUS FUMARATE 325 (106 FE) MG PO TABS Oral Take 1 tablet by mouth daily.      Marland Kitchen FOLIC ACID 800 MCG PO TABS Oral Take 400 mcg by mouth daily.      Marland Kitchen LEVOTHYROXINE SODIUM 75 MCG PO TABS Oral Take 37.5-75 mcg by mouth daily. Take 37.5 mg on tues and thurs, 75 mg all other days    . METOPROLOL TARTRATE 50 MG PO TABS Oral Take 1.5 tablets (75 mg total) by mouth 2 (two) times daily. 90 tablet 11  . SIMVASTATIN 20 MG PO TABS Oral Take 1 tablet (20 mg total) by mouth at bedtime. 30 tablet 11    . ACETAMINOPHEN 500 MG PO TABS Oral Take 500 mg by mouth every 6 (six) hours as needed. For pain       BP 146/60  Pulse 84  Temp(Src) 97.9 F (36.6 C) (Oral)  Resp 18  SpO2 100%  Physical Exam  Nursing note and vitals reviewed. Constitutional: She is oriented to person, place, and time.       Patient is a slender elderly woman who complains of being cold.  HENT:  Head: Normocephalic and atraumatic.  Right Ear: External ear normal.  Left Ear: External ear normal.  Mouth/Throat: Oropharynx is clear and moist.  Eyes: Conjunctivae and EOM are normal. Pupils are equal, round, and reactive to light.  Neck: Normal range of motion. Neck supple.  Cardiovascular: Normal rate, regular rhythm and normal heart sounds.   Pulmonary/Chest: Effort normal and breath sounds normal.  Abdominal: Soft. Bowel sounds are normal. She exhibits no distension. There is no tenderness.  Musculoskeletal: Edema: she has some swelling in her left ankle, apparently the result of a recent injury.  Lymphadenopathy:    She has no cervical adenopathy.  Neurological: She is alert and oriented to person, place, and time.       Sensory or motor deficits.  Skin: Skin is warm and dry.  Psychiatric: She has a normal mood and affect. Her behavior is normal.    ED Course  Procedures (including critical care time)  Labs Reviewed  CBC - Abnormal; Notable for the following:    RBC 2.58 (*)    Hemoglobin 7.5 (*)    HCT 22.2 (*)    All other components within normal limits  DIFFERENTIAL - Abnormal; Notable for the following:    Lymphocytes Relative 11 (*)    Monocytes Absolute 1.2 (*)    All other components within normal limits  COMPREHENSIVE METABOLIC PANEL - Abnormal; Notable for the following:    Sodium 131 (*)    Chloride 92 (*)    CO2 17 (*)    Glucose, Bld 169 (*)    BUN 96 (*)    Creatinine, Ser 7.70 (*)    Calcium 8.2 (*)    Total Protein 9.4 (*)    Albumin 3.0 (*)    Alkaline Phosphatase 122 (*)     Total Bilirubin 0.2 (*)    GFR calc non Af Amer 4 (*)    GFR calc Af Amer 5 (*)    All other components within normal limits  URINALYSIS, ROUTINE W REFLEX MICROSCOPIC - Abnormal; Notable for the following:    Appearance TURBID (*)    Glucose, UA 250 (*)    Hgb urine dipstick LARGE (*)    Protein, ur >300 (*)    Leukocytes, UA LARGE (*)    All other components within normal limits  URINE MICROSCOPIC-ADD ON  URINE CULTURE   Dg Chest 2 View  11/21/2010  *RADIOLOGY REPORT*  Clinical Data: Prior aortic valve replacement, abnormal labs  CHEST - 2 VIEW  Comparison: 07/18/2010  Findings: Chronic interstitial markings. No pleural effusion or pneumothorax.  The heart is top normal in size. Postsurgical changes related to prior CABG.  Aortic valve replacement.  Mild degenerative changes with curvature of the visualized thoracolumbar spine.  IMPRESSION: No evidence of acute cardiopulmonary disease.  Original Report Authenticated By: Charline Bills, M.D.    Date: 11/21/2010  Rate:82  Rhythm: normal sinus rhythm  QRS Axis: right  Intervals: QT prolonged QRS:  poor R wave progression in precordial leads suggests possible old anterior myocardial infarction.  ST/T Wave abnormalities: normal  Conduction Disutrbances:none  Narrative Interpretation: Abnormal EKG  Old EKG Reviewed: unchanged  12:12 AM Results for orders placed during the hospital encounter of 11/21/10  CBC      Component Value Range   WBC 10.3  4.0 - 10.5 (K/uL)   RBC 2.58 (*) 3.87 - 5.11 (MIL/uL)   Hemoglobin 7.5 (*) 12.0 - 15.0 (g/dL)   HCT 40.9 (*) 81.1 - 46.0 (%)   MCV 86.0  78.0 - 100.0 (fL)   MCH 29.1  26.0 - 34.0 (pg)   MCHC 33.8  30.0 - 36.0 (g/dL)   RDW 91.4  78.2 - 95.6 (%)   Platelets 255  150 - 400 (K/uL)  DIFFERENTIAL      Component Value Range   Neutrophils Relative 72  43 - 77 (%)   Neutro Abs 7.4  1.7 - 7.7 (K/uL)   Lymphocytes Relative 11 (*) 12 - 46 (%)   Lymphs Abs 1.2  0.7 - 4.0 (K/uL)   Monocytes  Relative 12  3 - 12 (%)   Monocytes Absolute 1.2 (*) 0.1 - 1.0 (K/uL)   Eosinophils Relative 4  0 - 5 (%)   Eosinophils Absolute 0.4  0.0 - 0.7 (K/uL)   Basophils Relative 1  0 - 1 (%)   Basophils Absolute 0.1  0.0 - 0.1 (K/uL)  COMPREHENSIVE METABOLIC PANEL      Component Value Range   Sodium 131 (*) 135 - 145 (mEq/L)   Potassium 4.0  3.5 - 5.1 (mEq/L)   Chloride 92 (*) 96 - 112 (mEq/L)   CO2 17 (*) 19 - 32 (mEq/L)   Glucose, Bld 169 (*) 70 - 99 (mg/dL)   BUN 96 (*) 6 - 23 (mg/dL)   Creatinine, Ser 2.13 (*) 0.50 - 1.10 (mg/dL)   Calcium 8.2 (*) 8.4 - 10.5 (mg/dL)   Total Protein 9.4 (*) 6.0 - 8.3 (g/dL)   Albumin 3.0 (*) 3.5 - 5.2 (g/dL)   AST 10  0 - 37 (U/L)   ALT <5  0 - 35 (U/L)   Alkaline Phosphatase 122 (*) 39 - 117 (U/L)   Total Bilirubin 0.2 (*) 0.3 - 1.2 (mg/dL)   GFR calc non Af Amer 4 (*) >90 (mL/min)   GFR calc Af Amer 5 (*) >90 (mL/min)  URINALYSIS, ROUTINE W REFLEX MICROSCOPIC      Component Value Range   Color, Urine YELLOW  YELLOW  Appearance TURBID (*) CLEAR    Specific Gravity, Urine 1.013  1.005 - 1.030    pH 6.0  5.0 - 8.0    Glucose, UA 250 (*) NEGATIVE (mg/dL)   Hgb urine dipstick LARGE (*) NEGATIVE    Bilirubin Urine NEGATIVE  NEGATIVE    Ketones, ur NEGATIVE  NEGATIVE (mg/dL)   Protein, ur >409 (*) NEGATIVE (mg/dL)   Urobilinogen, UA 0.2  0.0 - 1.0 (mg/dL)   Nitrite NEGATIVE  NEGATIVE    Leukocytes, UA LARGE (*) NEGATIVE   URINE MICROSCOPIC-ADD ON      Component Value Range   Squamous Epithelial / LPF RARE  RARE    WBC, UA TOO NUMEROUS TO COUNT  <3 (WBC/hpf)   RBC / HPF 21-50  <3 (RBC/hpf)   Bacteria, UA RARE  RARE    Dg Chest 2 View  11/21/2010  *RADIOLOGY REPORT*  Clinical Data: Prior aortic valve replacement, abnormal labs  CHEST - 2 VIEW  Comparison: 07/18/2010  Findings: Chronic interstitial markings. No pleural effusion or pneumothorax.  The heart is top normal in size. Postsurgical changes related to prior CABG.  Aortic valve  replacement.  Mild degenerative changes with curvature of the visualized thoracolumbar spine.  IMPRESSION: No evidence of acute cardiopulmonary disease.  Original Report Authenticated By: Charline Bills, M.D.    12:12 AM Pt's lab tests show renal failure and anemia and UTI.  Will call [facility] to admit her.     1. Urinary tract infection   2. Renal failure   3. Anemia           Carleene Cooper III, MD 11/22/10 [phone]

## 2010-11-21 NOTE — ED Notes (Signed)
Pt seen by [facility] urologist yesterday at [facility].  Pt was diagnosed with UTI.  Received call today and told to come to hospital due to abnormal labs.  Pt unsure what was abnormal. Family reports pt complaining of thirst x 1 week.

## 2010-11-21 NOTE — Telephone Encounter (Signed)
Phone from Dr Iva Lento Had seen her and other than apparent UTI (he gave her some cipro), she appeared okay His labs came back today and creat up from 3.6 to 8.3 BUN 94 potassium okay and CO2 15 Needs admission  He advised her to go to [facility] ER for admit  She wanted him to notify me  She has to wait for son but will go for evaluation when he gets home

## 2010-11-22 ENCOUNTER — Inpatient Hospital Stay (HOSPITAL_COMMUNITY): Payer: Medicare Other

## 2010-11-22 ENCOUNTER — Encounter (HOSPITAL_COMMUNITY): Payer: Self-pay | Admitting: Emergency Medicine

## 2010-11-22 DIAGNOSIS — N179 Acute kidney failure, unspecified: Secondary | ICD-10-CM | POA: Diagnosis present

## 2010-11-22 DIAGNOSIS — N289 Disorder of kidney and ureter, unspecified: Secondary | ICD-10-CM | POA: Insufficient documentation

## 2010-11-22 DIAGNOSIS — N390 Urinary tract infection, site not specified: Secondary | ICD-10-CM | POA: Diagnosis present

## 2010-11-22 DIAGNOSIS — E119 Type 2 diabetes mellitus without complications: Secondary | ICD-10-CM | POA: Insufficient documentation

## 2010-11-22 LAB — CBC
HCT: 17.9 % — ABNORMAL LOW (ref 36.0–46.0)
MCHC: 33.5 g/dL (ref 30.0–36.0)
MCHC: 33.9 g/dL (ref 30.0–36.0)
Platelets: 187 10*3/uL (ref 150–400)
Platelets: 187 10*3/uL (ref 150–400)
RDW: 13.2 % (ref 11.5–15.5)
RDW: 13.3 % (ref 11.5–15.5)
WBC: 7.1 10*3/uL (ref 4.0–10.5)
WBC: 11.6 10*3/uL — ABNORMAL HIGH (ref 4.0–10.5)

## 2010-11-22 LAB — RETICULOCYTES
RBC.: 2.06 MIL/uL — ABNORMAL LOW (ref 3.87–5.11)
Retic Count, Absolute: 45.3 10*3/uL (ref 19.0–186.0)

## 2010-11-22 LAB — BASIC METABOLIC PANEL
BUN: 92 mg/dL — ABNORMAL HIGH (ref 6–23)
Calcium: 7.4 mg/dL — ABNORMAL LOW (ref 8.4–10.5)
Chloride: 98 mEq/L (ref 96–112)
Creatinine, Ser: 7.83 mg/dL — ABNORMAL HIGH (ref 0.50–1.10)
GFR calc Af Amer: 5 mL/min — ABNORMAL LOW (ref >90)
GFR calc non Af Amer: 4 mL/min — ABNORMAL LOW (ref >90)

## 2010-11-22 LAB — GLUCOSE, CAPILLARY
Glucose-Capillary: 141 mg/dL — ABNORMAL HIGH (ref 70–99)
Glucose-Capillary: 195 mg/dL — ABNORMAL HIGH (ref 70–99)
Glucose-Capillary: 164 mg/dL — ABNORMAL HIGH (ref 70–99)
Glucose-Capillary: 159 mg/dL — ABNORMAL HIGH (ref 70–99)

## 2010-11-22 LAB — MRSA PCR SCREENING: MRSA by PCR: POSITIVE — AB

## 2010-11-22 LAB — IRON AND TIBC
Iron: 56 ug/dL (ref 42–135)
UIBC: 137 ug/dL (ref 125–400)

## 2010-11-22 LAB — CREATININE, SERUM
Creatinine, Ser: 7.78 mg/dL — ABNORMAL HIGH (ref 0.50–1.10)
GFR calc Af Amer: 5 mL/min — ABNORMAL LOW (ref >90)
GFR calc non Af Amer: 4 mL/min — ABNORMAL LOW (ref >90)

## 2010-11-22 LAB — FOLATE: Folate: >20 ng/mL

## 2010-11-22 LAB — VITAMIN B12: Vitamin B-12: 461 pg/mL (ref 211–911)

## 2010-11-22 LAB — PREPARE RBC (CROSSMATCH)

## 2010-11-22 LAB — TSH: TSH: 2.068 u[IU]/mL (ref 0.350–4.500)

## 2010-11-22 MED ORDER — CEFTRIAXONE SODIUM 1 G IJ SOLR
1.0000 g | INTRAMUSCULAR | Status: DC
  Administered 2010-11-22 – 2010-11-27 (×6): 1 g via INTRAVENOUS
  Filled 2010-11-22 (×6): qty 10

## 2010-11-22 MED ORDER — VITAMIN C 500 MG PO TABS
500.0000 mg | ORAL_TABLET | Freq: Every day | ORAL | Status: DC
  Administered 2010-11-22 – 2010-11-28 (×7): 500 mg via ORAL
  Filled 2010-11-22 (×7): qty 1

## 2010-11-22 MED ORDER — INSULIN GLARGINE 100 UNIT/ML ~~LOC~~ SOLN
10.0000 [IU] | Freq: Every day | SUBCUTANEOUS | Status: DC
  Administered 2010-11-22: 10 [IU] via SUBCUTANEOUS
  Filled 2010-11-22: qty 3

## 2010-11-22 MED ORDER — LEVOTHYROXINE SODIUM 75 MCG PO TABS
75.0000 ug | ORAL_TABLET | ORAL | Status: DC
  Administered 2010-11-22 – 2010-11-28 (×6): 75 ug via ORAL
  Filled 2010-11-22 (×9): qty 1

## 2010-11-22 MED ORDER — ASPIRIN 81 MG PO CHEW
81.0000 mg | CHEWABLE_TABLET | Freq: Every day | ORAL | Status: DC
  Administered 2010-11-22 – 2010-11-25 (×4): 81 mg via ORAL
  Filled 2010-11-22 (×4): qty 1

## 2010-11-22 MED ORDER — ONDANSETRON HCL 4 MG/2ML IJ SOLN: 4.0000 mg | Freq: Four times a day (QID) | INTRAMUSCULAR | Status: DC | PRN

## 2010-11-22 MED ORDER — FOLIC ACID 800 MCG PO TABS
400.0000 ug | ORAL_TABLET | Freq: Every day | ORAL | Status: DC
  Administered 2010-11-22: 400 ug via ORAL
  Filled 2010-11-22 (×2): qty 1

## 2010-11-22 MED ORDER — METOPROLOL TARTRATE 50 MG PO TABS
75.0000 mg | ORAL_TABLET | Freq: Two times a day (BID) | ORAL | Status: DC
  Administered 2010-11-22 – 2010-11-28 (×14): 75 mg via ORAL
  Filled 2010-11-22 (×17): qty 1

## 2010-11-22 MED ORDER — HYDROCODONE-ACETAMINOPHEN 5-325 MG PO TABS: 1.0000 | ORAL_TABLET | ORAL | Status: DC | PRN

## 2010-11-22 MED ORDER — GLUCERNA SHAKE PO LIQD
237.0000 mL | Freq: Three times a day (TID) | ORAL | Status: DC
  Administered 2010-11-22 – 2010-11-25 (×6): 237 mL via ORAL
  Filled 2010-11-22 (×4): qty 237

## 2010-11-22 MED ORDER — SENNOSIDES-DOCUSATE SODIUM 8.6-50 MG PO TABS
1.0000 | ORAL_TABLET | Freq: Every day | ORAL | Status: DC | PRN
  Administered 2010-11-24: 1 via ORAL
  Filled 2010-11-22 (×2): qty 1

## 2010-11-22 MED ORDER — CLONIDINE HCL 0.2 MG PO TABS
0.2000 mg | ORAL_TABLET | Freq: Three times a day (TID) | ORAL | Status: DC
  Administered 2010-11-22 – 2010-11-23 (×5): 0.2 mg via ORAL
  Administered 2010-11-23: 18:00:00 via ORAL
  Administered 2010-11-24 – 2010-11-28 (×14): 0.2 mg via ORAL
  Filled 2010-11-22 (×21): qty 1

## 2010-11-22 MED ORDER — ALPRAZOLAM 0.25 MG PO TABS
0.2500 mg | ORAL_TABLET | Freq: Every evening | ORAL | Status: DC | PRN
  Administered 2010-11-24 – 2010-11-27 (×2): 0.25 mg via ORAL
  Filled 2010-11-22 (×2): qty 1

## 2010-11-22 MED ORDER — ONDANSETRON HCL 4 MG PO TABS: 4.0000 mg | ORAL_TABLET | Freq: Four times a day (QID) | ORAL | Status: DC | PRN

## 2010-11-22 MED ORDER — LEVOTHYROXINE SODIUM 75 MCG PO TABS: 37.5000 ug | ORAL_TABLET | ORAL | Status: DC

## 2010-11-22 MED ORDER — SODIUM CHLORIDE 0.9 % IV SOLN
INTRAVENOUS | Status: DC
  Administered 2010-11-22 – 2010-11-25 (×7): via INTRAVENOUS

## 2010-11-22 MED ORDER — ACETAMINOPHEN 650 MG RE SUPP: 650.0000 mg | Freq: Four times a day (QID) | RECTAL | Status: DC | PRN

## 2010-11-22 MED ORDER — ACETAMINOPHEN 325 MG PO TABS
650.0000 mg | ORAL_TABLET | Freq: Once | ORAL | Status: AC
  Administered 2010-11-22: 650 mg via ORAL

## 2010-11-22 MED ORDER — INSULIN ASPART 100 UNIT/ML ~~LOC~~ SOLN
0.0000 [IU] | Freq: Every day | SUBCUTANEOUS | Status: DC
  Administered 2010-11-24: 5 [IU] via SUBCUTANEOUS
  Administered 2010-11-25: 3 [IU] via SUBCUTANEOUS
  Administered 2010-11-27: 2 [IU] via SUBCUTANEOUS
  Administered 2010-11-27: 4 [IU] via SUBCUTANEOUS
  Filled 2010-11-22: qty 3

## 2010-11-22 MED ORDER — FERROUS FUMARATE 325 (106 FE) MG PO TABS
1.0000 | ORAL_TABLET | Freq: Every day | ORAL | Status: DC
  Administered 2010-11-22 – 2010-11-28 (×7): 106 mg via ORAL
  Filled 2010-11-22 (×9): qty 1

## 2010-11-22 MED ORDER — LEVOTHYROXINE SODIUM 75 MCG PO TABS
37.5000 ug | ORAL_TABLET | ORAL | Status: DC
  Administered 2010-11-25 – 2010-11-27 (×2): 37.5 ug via ORAL
  Filled 2010-11-22 (×2): qty 0.5

## 2010-11-22 MED ORDER — ESTRADIOL 1 MG PO TABS
0.5000 mg | ORAL_TABLET | Freq: Every day | ORAL | Status: DC
  Administered 2010-11-22: 1 mg via ORAL
  Administered 2010-11-23 – 2010-11-28 (×6): 0.5 mg via ORAL
  Filled 2010-11-22 (×7): qty 0.5

## 2010-11-22 MED ORDER — AMLODIPINE BESYLATE 5 MG PO TABS
5.0000 mg | ORAL_TABLET | Freq: Every day | ORAL | Status: DC
  Administered 2010-11-22 – 2010-11-23 (×2): 5 mg via ORAL
  Filled 2010-11-22 (×3): qty 1

## 2010-11-22 MED ORDER — SIMVASTATIN 20 MG PO TABS
20.0000 mg | ORAL_TABLET | Freq: Every day | ORAL | Status: DC
  Administered 2010-11-22 – 2010-11-27 (×6): 20 mg via ORAL
  Filled 2010-11-22 (×7): qty 1

## 2010-11-22 MED ORDER — HYDROMORPHONE HCL PF 1 MG/ML IJ SOLN: 1.0000 mg | INTRAMUSCULAR | Status: DC | PRN

## 2010-11-22 MED ORDER — ACETAMINOPHEN 325 MG PO TABS
650.0000 mg | ORAL_TABLET | Freq: Four times a day (QID) | ORAL | Status: DC | PRN
  Administered 2010-11-22: 650 mg via ORAL
  Filled 2010-11-22 (×2): qty 2

## 2010-11-22 MED ORDER — INSULIN ASPART 100 UNIT/ML ~~LOC~~ SOLN
0.0000 [IU] | Freq: Three times a day (TID) | SUBCUTANEOUS | Status: DC
  Administered 2010-11-22: 1 [IU] via SUBCUTANEOUS
  Administered 2010-11-22 – 2010-11-23 (×4): 2 [IU] via SUBCUTANEOUS
  Administered 2010-11-24: 3 [IU] via SUBCUTANEOUS
  Administered 2010-11-24: 2 [IU] via SUBCUTANEOUS
  Administered 2010-11-24: 3 [IU] via SUBCUTANEOUS
  Administered 2010-11-25 (×2): 5 [IU] via SUBCUTANEOUS
  Administered 2010-11-25: 7 [IU] via SUBCUTANEOUS
  Administered 2010-11-26: 3 [IU] via SUBCUTANEOUS
  Administered 2010-11-26: 5 [IU] via SUBCUTANEOUS
  Administered 2010-11-26: 3 [IU] via SUBCUTANEOUS
  Administered 2010-11-27 (×2): 2 [IU] via SUBCUTANEOUS
  Administered 2010-11-27: 7 [IU] via SUBCUTANEOUS
  Administered 2010-11-28 (×2): 2 [IU] via SUBCUTANEOUS

## 2010-11-22 MED ORDER — SACCHAROMYCES BOULARDII 250 MG PO CAPS
250.0000 mg | ORAL_CAPSULE | Freq: Two times a day (BID) | ORAL | Status: DC
  Administered 2010-11-22 – 2010-11-28 (×13): 250 mg via ORAL
  Filled 2010-11-22 (×14): qty 1

## 2010-11-22 NOTE — ED Notes (Signed)
Attempted to call report.  RN in huddle at this time.  Phone number and name given to Diplomatic Services operational officer.

## 2010-11-22 NOTE — Progress Notes (Signed)
INITIAL ADULT NUTRITION ASSESSMENT Date: 11/22/2010   Time: 1:03 PM  Reason for Assessment:  Consult-unintentional weight loss  ASSESSMENT: Female 77 y.o.  Dx: Acute renal failure (ARF)  Hx:  Past Medical History  Diagnosis Date  . Anemia     NOS  . Personal history of colonic polyps   . Diabetes mellitus     type II  . GERD (gastroesophageal reflux disease)   . Hyperlipidemia   . Hypertension   . Hypothyroidism   . Osteoarthritis   . Osteopenia   . Urinary incontinence   . Pulmonary hypertension   . Anxiety   . Renal insufficiency   . Coronary artery disease   . Aortic stenosis    Related Meds:     . acetaminophen  650 mg Oral Once  . amLODipine  5 mg Oral Daily  . aspirin  81 mg Oral Daily  . cefTRIAXone (ROCEPHIN) IV  1 g Intravenous Q24H  . cloNIDine  0.2 mg Oral TID  . estradiol  0.5 mg Oral Daily  . ferrous fumarate  1 tablet Oral Daily  . folic acid  400 mcg Oral Daily  . insulin aspart  0-5 Units Subcutaneous QHS  . insulin aspart  0-9 Units Subcutaneous TID WC  . insulin glargine  10 Units Subcutaneous QHS  . levothyroxine  37.5 mcg Oral Custom  . levothyroxine  75 mcg Oral Custom  . metoprolol  75 mg Oral BID  . saccharomyces boulardii  250 mg Oral BID  . simvastatin  20 mg Oral QHS  . vitamin C  500 mg Oral Daily  . DISCONTD: levothyroxine  37.5 mcg Oral Custom     Ht: 5\' 3"  (160 cm)  Wt: 105 lb 13.1 oz (48 kg)  Ideal Wt: 52.3 kg % Ideal Wt: 92%  Usual Wt: 115# % Usual Wt: 91%  Body mass index is 18.75 kg/(m^2).  Food/Nutrition Related Hx: Patient reports progressive weight loss over the past 6 months.  Poor appetite.  BMET    Component Value Date/Time   NA 133* 11/22/2010 0335   K 4.1 11/22/2010 0335   CL 98 11/22/2010 0335   CO2 17* 11/22/2010 0335   GLUCOSE 200* 11/22/2010 0335   GLUCOSE 144* 01/13/2006 0000   BUN 92* 11/22/2010 0335   CREATININE 7.83* 11/22/2010 0335   CALCIUM 7.4* 11/22/2010 0335   GFRNONAA 4* 11/22/2010  0335   GFRAA 5* 11/22/2010 0335    CBG (last 3)   Basename 11/22/10 1230 11/22/10 0750 11/22/10 0219  GLUCAP 195* 141* 215*    I/O last 3 completed shifts: In: 525 [P.O.:120; I.V.:355; IV Piggyback:50] Out: 100 [Urine:100]     Diet Order: CHO-modified Medium  IVF: NS at 100 ml/h  Estimated Nutritional Needs:   Kcal: 1200-1400 Protein: 40-60 grams Fluid: 1.2-1.4 liters  NUTRITION DIAGNOSIS: -Inadequate oral intake (NI-2.1).  Status: Ongoing  RELATED TO: poor appetite  AS EVIDENCED BY: 9% weight loss.  Malnutrition related to suboptimal oral intake as evidenced by 9% weight loss.  MONITORING/EVALUATION(Goals): Adequate oral intake to meet 90-100% of nutrition needs to prevent further weight loss. Monitor supplement tolerance, PO intake, labs.  EDUCATION NEEDS: -No education needs identified at this time  INTERVENTION: Glucerna Shake PO TID between meals to maximize oral intake.  Dietitian [phone]  DOCUMENTATION CODES Per approved criteria  -Non-severe (moderate) malnutrition in the context of chronic illness -Underweight    [name] 11/22/2010, 1:03 PM

## 2010-11-22 NOTE — ED Notes (Signed)
Report called to [facility] on 6700.

## 2010-11-22 NOTE — Progress Notes (Signed)
Physical Therapy Evaluation Patient Details Name: [name] MRN: [id number] DOB: November 26, 1920 Today's Date: 11/22/2010  Problem List:  Patient Active Problem List  Diagnoses  . HYPOTHYROIDISM  . DIABETES MELLITUS, TYPE II  . HYPERLIPIDEMIA  . ANEMIA-NOS  . ANXIETY  . HYPERTENSION  . CORONARY ARTERY DISEASE  . PULMONARY HYPERTENSION  . AORTIC STENOSIS  . CAROTID ARTERY DISEASE  . UNSPECIFIED HYPOTENSION  . PAROTIDITIS  . GERD  . RENAL INSUFFICIENCY  . CELLULITIS, LEFT LEG  . NEURODERMATITIS  . OSTEOARTHRITIS  . OSTEOPENIA  . PALPITATIONS  . URINARY INCONTINENCE  . COLONIC POLYPS, HX OF  . Hypoglycemia  . Constipation, other cause  . Edema  . Arterial insufficiency-lower  . S/P aortic valve replacement  . Acute on chronic kidney disease, stage 3  . Diabetes mellitus  . GERD (gastroesophageal reflux disease)  . Hyperlipidemia  . Hypertension  . Hypothyroidism  . Renal insufficiency  . Coronary artery disease  . Anemia  . UTI (lower urinary tract infection)  . Acute renal failure (ARF)    Past Medical History:  Past Medical History  Diagnosis Date  . Anemia     NOS  . Personal history of colonic polyps   . Diabetes mellitus     type II  . GERD (gastroesophageal reflux disease)   . Hyperlipidemia   . Hypertension   . Hypothyroidism   . Osteoarthritis   . Osteopenia   . Urinary incontinence   . Pulmonary hypertension   . Anxiety   . Renal insufficiency   . Coronary artery disease   . Aortic stenosis    Past Surgical History:  Past Surgical History  Procedure Date  . Abdominal hysterectomy   . Tonsillectomy   . Cardiac catheterization 2000    cad  . Replacement total knee bilateral 05/1998  . Cataract extraction   . Coronary artery bypass graft     od  . Adenosine myoview 2007    benign, EF 69%  . Carotid endarterectomy 2011    Right  . Coronary artery bypass graft 2009  . Aortic valve replacement 2009  . Amputation-left great toe 7/12    Dr August Saucer    PT Assessment/Plan/Recommendation PT Assessment Clinical Impression Statement: Pt with significant weakness, likely secondary to acute illness and decreased hemoglobin. Pt had been transfused once, however pt still weak. Pt requiring up to Mod assist to walk. Will try RW next session. Anticipate pt will progress to being able to return home however given weakness displayed today PT will recommend SNF until pt able to ambulate better.  PT Recommendation/Assessment: Patient will need skilled PT in the acute care venue PT Problem List: Decreased strength;Decreased activity tolerance;Decreased balance;Decreased mobility;Decreased knowledge of use of DME;Decreased safety awareness Problem List Comments: Decr. safety awareness ("I move fine! This is not needed"- refering to PT) Barriers to Discharge: Decreased caregiver support PT Therapy Diagnosis : Difficulty walking;Abnormality of gait;Generalized weakness PT Plan PT Frequency: Min 3X/week PT Treatment/Interventions: DME instruction;Gait training;Functional mobility training;Therapeutic exercise;Balance training;Patient/family education PT Recommendation Follow Up Recommendations: Home health PT;Skilled nursing facility (vs) Equipment Recommended: Defer to next venue PT Goals  Acute Rehab PT Goals PT Goal Formulation: With patient Time For Goal Achievement: 2 weeks Pt will go Supine/Side to Sit: with modified independence PT Goal: Supine/Side to Sit - Progress: Progressing toward goal Pt will go Sit to Supine/Side: with modified independence PT Goal: Sit to Supine/Side - Progress: Progressing toward goal Pt will Transfer Sit to Stand/Stand to Sit: with  modified independence PT Transfer Goal: Sit to Stand/Stand to Sit - Progress: Progressing toward goal Pt will Ambulate: 51 - 150 feet;with least restrictive assistive device;with modified independence PT Goal: Ambulate - Progress: Progressing toward goal  PT  Evaluation Precautions/Restrictions  Precautions Precautions: Fall Prior Functioning  Home Living Lives With: Alone Receives Help From:  ("I don't need help") Type of Home: House Home Layout: One level (with a basement) Bathroom Shower/Tub: Engineer, manufacturing systems: Standard Bathroom Accessibility:  (to be determined) Home Adaptive Equipment: Walker - rolling;Wheelchair - manual Prior Function Level of Independence: Independent with basic ADLs Driving: Yes Vocation: Retired Financial risk analyst Arousal/Alertness: Awake/alert Overall Cognitive Status: Appears within functional limits for tasks assessed (question minimal memory loss/decr. awareness of deficits) Sensation/Coordination Sensation Light Touch: Appears Intact (per pt report) Coordination Gross Motor Movements are Fluid and Coordinated: Yes Extremity Assessment RLE Assessment RLE Assessment: Exceptions to [facility] RLE Strength RLE Overall Strength: Deficits RLE Overall Strength Comments: generalized deconditioning, grossly >/= 3/5 LLE Assessment LLE Assessment: Exceptions to [facility] LLE Strength LLE Overall Strength: Deficits LLE Overall Strength Comments: generalized deconditioning, grossly >/= 3/5 Mobility (including Balance) Bed Mobility Bed Mobility: Yes Supine to Sit: 5: Supervision Supine to Sit Details (indicate cue type and reason): verbal cues for negotiation of lines Sit to Supine - Right: 4: Min assist Sit to Supine - Right Details (indicate cue type and reason): for lower extremities Transfers Transfers: Yes Sit to Stand: 4: Min assist Sit to Stand Details (indicate cue type and reason): assistance for weakness Stand to Sit: 4: Min assist Stand to Sit Details: assistance for weakness and decreased balance Ambulation/Gait Ambulation/Gait: Yes Ambulation/Gait Assistance: 3: Mod assist Ambulation/Gait Assistance Details (indicate cue type and reason): up to mod assist for decr. dynamic stability and  weakness. Pt requires atleast 1 handed support on PT. Ambulation Distance (Feet): 12 Feet Assistive device: 1 person hand held assist Gait Pattern: Shuffle;Trunk flexed (decreased foot clearance bil. )  Posture/Postural Control Posture/Postural Control: No significant limitations Balance Balance Assessed: Yes Static Sitting Balance Static Sitting - Balance Support: Bilateral upper extremity supported;Feet supported Static Sitting - Level of Assistance: 6: Modified independent (Device/Increase time) Static Sitting - Comment/# of Minutes: 3-5 min Static Standing Balance Static Standing - Balance Support: Right upper extremity supported Static Standing - Level of Assistance: 4: Min assist Exercise  General Exercises - Lower Extremity Ankle Circles/Pumps: AROM;Both;10 reps;Supine End of Session PT - End of Session Equipment Utilized During Treatment: Gait belt Activity Tolerance: Patient limited by fatigue Patient left: in bed;with call bell in reach;with family/visitor present (per pt strong request) Nurse Communication: Mobility status for transfers;Mobility status for ambulation General Behavior During Session: [facility] for tasks performed Cognition: [facility] for tasks performed  Sherie Don 11/22/2010, 3:21 PM  Sherie Don) Carleene Mains PT, DPT Acute Rehabilitation [phone]

## 2010-11-22 NOTE — ED Notes (Signed)
Covering primary RN for lunch break.  Admitting consult is now at bedside for exam, patient to be admitting for UTI and ARF, vitals stable, will continue to monitor.

## 2010-11-22 NOTE — H&P (Addendum)
PCP:   Tillman Abide, MD, MD  Primary nephrologist: Dr. Rush [facility])  Chief Complaint:  Center by primary care physician for abnormal labs  HPI: Patient is 77 year old female with history of diabetes type 2 GERD, hyperlipidemia, hypertension, hypothyroidism, osteoarthritis, CAD, history of C. Difficile had seen her nephrologist yesterday for UTI. Patient was started on ciprofloxacin for UTI yesterday. Patient had been complaining about feeling thirsty a lot for last one week. Last week on Friday she also had multiple episodes of nausea and vomiting which spontaneously resolved in 24 hours. For last 3-4 days patient started complaining of difficulty urinating, significant dysuria with bladder spasms. She denied any fever chills any abdominal pain diarrhea. She also had increased frequency of urination as well. Patient was started on ciprofloxacin by her nephrologist and the labs were sent. Patient was called today for the abnormal labs that her creatinine function had worsened to 7.7 and was recommended to go to the hospital for IV fluids and workup. At baseline patient has chronic kidney disease stage III with baseline creatinine of 3.3-3.8.   Review of Systems:  Constitutional: Denies fever, chills, diaphoresis, appetite change and fatigue.  HEENT: Denies photophobia, eye pain, redness, hearing loss, ear pain, congestion, sore throat, rhinorrhea, sneezing, mouth sores, trouble swallowing, neck pain, neck stiffness and tinnitus.   Respiratory: Denies SOB, DOE, cough, chest tightness,  and wheezing.   Cardiovascular: Denies chest pain, palpitations and leg swelling.  Gastrointestinal: Denies nausea, vomiting, abdominal pain, diarrhea, constipation, blood in stool and abdominal distention at this time.  Genitourinary: See history of present illness Musculoskeletal: Denies myalgias, back pain, joint swelling, arthralgias and gait problem.  Skin: Denies pallor, rash and wound.    Neurological: Denies dizziness, seizures, syncope, weakness, light-headedness, numbness and headaches.  Hematological: Denies adenopathy. Easy bruising, personal or family bleeding history  Psychiatric/Behavioral: Denies suicidal ideation, mood changes, confusion, nervousness, sleep disturbance and agitation  Past Medical History: Past Medical History  Diagnosis Date  . Anemia     NOS  . Personal history of colonic polyps   . Diabetes mellitus     type II  . GERD (gastroesophageal reflux disease)   . Hyperlipidemia   . Hypertension   . Hypothyroidism   . Osteoarthritis   . Osteopenia   . Urinary incontinence   . Pulmonary hypertension   . Anxiety   . Renal insufficiency   . Coronary artery disease   . Aortic stenosis    Past Surgical History  Procedure Date  . Abdominal hysterectomy   . Tonsillectomy   . Cardiac catheterization 2000    cad  . Replacement total knee bilateral 05/1998  . Cataract extraction   . Coronary artery bypass graft     od  . Adenosine myoview 2007    benign, EF 69%  . Carotid endarterectomy 2011    Right  . Coronary artery bypass graft 2009  . Aortic valve replacement 2009  . Amputation-left great toe 7/12    Dr August Saucer    Medications: Prior to Admission medications   Medication Sig Start Date End Date Taking? Authorizing Provider  ALPRAZolam Prudy Feeler) 0.5 MG tablet Take 0.25 mg by mouth at bedtime as needed. For anxiety   Yes Historical Provider, MD  amLODipine (NORVASC) 5 MG tablet Take 1 tablet (5 mg total) by mouth daily. 08/01/10 08/01/11 Yes Valera Castle, MD  Ascorbic Acid (VITAMIN C) 500 MG CHEW Chew 1 tablet by mouth daily.     Yes Historical Provider, MD  aspirin 81 MG tablet Take 81 mg by mouth daily.     Yes Historical Provider, MD  ciprofloxacin (CIPRO) 250 MG tablet Take 250 mg by mouth every other day. For 3 days (last dose is 11/22/2010)    Yes Historical Provider, MD  cloNIDine (CATAPRES) 0.2 MG tablet Take 0.2 mg by mouth 3  (three) times daily.  07/11/10  Yes Historical Provider, MD  estradiol (ESTRACE) 0.5 MG tablet Take 0.5 mg by mouth daily.     Yes Historical Provider, MD  ferrous fumarate (HEMOCYTE - 106 MG FE) 325 (106 FE) MG TABS Take 1 tablet by mouth daily.     Yes Historical Provider, MD  folic acid (FOLVITE) 800 MCG tablet Take 400 mcg by mouth daily.     Yes Historical Provider, MD  levothyroxine (SYNTHROID, LEVOTHROID) 75 MCG tablet Take 37.5-75 mcg by mouth daily. Take 37.5 mg on tues and thurs, 75 mg all other days   Yes Historical Provider, MD  metoprolol (LOPRESSOR) 50 MG tablet Take 1.5 tablets (75 mg total) by mouth 2 (two) times daily. 08/01/10  Yes Valera Castle, MD  simvastatin (ZOCOR) 20 MG tablet Take 1 tablet (20 mg total) by mouth at bedtime. 08/12/10  Yes Valera Castle, MD  acetaminophen (TYLENOL) 500 MG tablet Take 500 mg by mouth every 6 (six) hours as needed. For pain     Historical Provider, MD    Allergies:   Allergies  Allergen Reactions  . Captopril     REACTION: unspecified  . Enalapril Maleate     REACTION: cough  . Nitrofurantoin     REACTION: itching  . Ramipril     REACTION: unspecified  . Sulfa Antibiotics Other (See Comments)    Reaction unknown  . Verapamil     REACTION: unspecified    Social History:  reports that she has quit smoking. She has never used smokeless tobacco. She reports that she drinks alcohol. She reports that she does not use illicit drugs.  Family History: History reviewed. No pertinent family history.  Physical Exam: Blood pressure 152/68, pulse 73, temperature 98.4 F (36.9 C), temperature source Oral, resp. rate 19, SpO2 100.00%. General: Alert, awake, oriented x3, in no acute distress. HEENT: anicteric sclera, pink conjunctiva, pupils equal and reactive to light and accomodation Neck: supple, no masses or lymphadenopathy, no goiter, no bruits  Heart: Regular rate and rhythm, without murmurs, rubs or gallops. Lungs: Clear to auscultation  bilaterally, no wheezing, rales or rhonchi. Abdomen: Soft, nontender, nondistended, positive bowel sounds, no masses. Extremities: No clubbing, cyanosis or edema with positive pedal pulses. Neuro: Grossly intact, no focal neurological deficits, strength 5/5 upper and lower extremities bilaterally Psych: alert and oriented x 3, normal mood and affect Skin: no rashes or lesions, warm and dry   Labs on Admission:  Results for orders placed during the hospital encounter of 11/21/10 (from the past 24 hour(s))  CBC     Status: Abnormal   Collection Time   11/21/10  8:44 PM      Component Value Range   WBC 10.3  4.0 - 10.5 (K/uL)   RBC 2.58 (*) 3.87 - 5.11 (MIL/uL)   Hemoglobin 7.5 (*) 12.0 - 15.0 (g/dL)   HCT 19.1 (*) 47.8 - 46.0 (%)   MCV 86.0  78.0 - 100.0 (fL)   MCH 29.1  26.0 - 34.0 (pg)   MCHC 33.8  30.0 - 36.0 (g/dL)   RDW 29.5  62.1 - 30.8 (%)   Platelets 255  150 - 400 (K/uL)  DIFFERENTIAL     Status: Abnormal   Collection Time   11/21/10  8:44 PM      Component Value Range   Neutrophils Relative 72  43 - 77 (%)   Neutro Abs 7.4  1.7 - 7.7 (K/uL)   Lymphocytes Relative 11 (*) 12 - 46 (%)   Lymphs Abs 1.2  0.7 - 4.0 (K/uL)   Monocytes Relative 12  3 - 12 (%)   Monocytes Absolute 1.2 (*) 0.1 - 1.0 (K/uL)   Eosinophils Relative 4  0 - 5 (%)   Eosinophils Absolute 0.4  0.0 - 0.7 (K/uL)   Basophils Relative 1  0 - 1 (%)   Basophils Absolute 0.1  0.0 - 0.1 (K/uL)  COMPREHENSIVE METABOLIC PANEL     Status: Abnormal   Collection Time   11/21/10  8:44 PM      Component Value Range   Sodium 131 (*) 135 - 145 (mEq/L)   Potassium 4.0  3.5 - 5.1 (mEq/L)   Chloride 92 (*) 96 - 112 (mEq/L)   CO2 17 (*) 19 - 32 (mEq/L)   Glucose, Bld 169 (*) 70 - 99 (mg/dL)   BUN 96 (*) 6 - 23 (mg/dL)   Creatinine, Ser 0.45 (*) 0.50 - 1.10 (mg/dL)   Calcium 8.2 (*) 8.4 - 10.5 (mg/dL)   Total Protein 9.4 (*) 6.0 - 8.3 (g/dL)   Albumin 3.0 (*) 3.5 - 5.2 (g/dL)   AST 10  0 - 37 (U/L)   ALT <5  0 -  35 (U/L)   Alkaline Phosphatase 122 (*) 39 - 117 (U/L)   Total Bilirubin 0.2 (*) 0.3 - 1.2 (mg/dL)   GFR calc non Af Amer 4 (*) >90 (mL/min)   GFR calc Af Amer 5 (*) >90 (mL/min)  URINALYSIS, ROUTINE W REFLEX MICROSCOPIC     Status: Abnormal   Collection Time   11/21/10  8:49 PM      Component Value Range   Color, Urine YELLOW  YELLOW    Appearance TURBID (*) CLEAR    Specific Gravity, Urine 1.013  1.005 - 1.030    pH 6.0  5.0 - 8.0    Glucose, UA 250 (*) NEGATIVE (mg/dL)   Hgb urine dipstick LARGE (*) NEGATIVE    Bilirubin Urine NEGATIVE  NEGATIVE    Ketones, ur NEGATIVE  NEGATIVE (mg/dL)   Protein, ur >409 (*) NEGATIVE (mg/dL)   Urobilinogen, UA 0.2  0.0 - 1.0 (mg/dL)   Nitrite NEGATIVE  NEGATIVE    Leukocytes, UA LARGE (*) NEGATIVE   URINE MICROSCOPIC-ADD ON     Status: Normal   Collection Time   11/21/10  8:49 PM      Component Value Range   Squamous Epithelial / LPF RARE  RARE    WBC, UA TOO NUMEROUS TO COUNT  <3 (WBC/hpf)   RBC / HPF 21-50  <3 (RBC/hpf)   Bacteria, UA RARE  RARE     Radiological Exams on Admission: Dg Chest 2 View  11/21/2010  *RADIOLOGY REPORT*  Clinical Data: Prior aortic valve replacement, abnormal labs  CHEST - 2 VIEW  Comparison: 07/18/2010  Findings: Chronic interstitial markings. No pleural effusion or pneumothorax.  The heart is top normal in size. Postsurgical changes related to prior CABG.  Aortic valve replacement.  Mild degenerative changes with curvature of the visualized thoracolumbar spine.  IMPRESSION: No evidence of acute cardiopulmonary disease.  Original Report Authenticated By: Charline Bills, M.D.    Assessment/Plan  Present on Admission:  Acute renal failure on chronic kidney disease stage III: - Admit to medicine service, telemetry monitored floor, Cr likely worsened due to dehydration, vomiting, UTI  - Continue IV fluid hydration, obtain urine lytes, urine creatinine, SPEP, UPEP, renal ultrasound for further workup  - If the  creatinine function continues to worsen, renal consultation will be obtained   UTI/ urinary tract infection: - Obtain urine culture and blood culture, start patient on Rocephin. Adjust antibiotics according to the culture results. Add probiotic due to the previous history of C. Difficile.  Marland KitchenHYPOTHYROIDISM: Obtain TSH and continue Synthroid   .DIABETES MELLITUS, TYPE II: Obtain HbA1c and continue sliding scale insulin. Patient not on any oral hypoglycemics or insulin at home.   Marland KitchenHYPERLIPIDEMIA: Obtain lipid panel, continue simvastatin   .ANXIETY: Stable, continue alprazolam   .ANEMIA-NOS: Likely secondary to chronic kidney disease however obtain stool occult blood, anemia panel, SPEP, UPEP. Transfuse one unit of pRBC's if Hb is less than 7.5 on tomorrow am labs.   Marland KitchenHYPERTENSION: Continue home medication which included norvasc, beta blocker and clonidine   .CORONARY ARTERY DISEASE: Stable continue aspirin, beta blocker and statins  DVT prophylaxis: Bilateral SCDs  CODE STATUS I discussed in detail with the patient herself whose wishes a DO NOT RESUSCITATE   @Time  Spent on Admission: 1 hour  Irene Collings 11/22/2010, 1:19 AM

## 2010-11-22 NOTE — ED Notes (Signed)
Patient took all night meds per Dr Ignacia Palma.

## 2010-11-22 NOTE — Progress Notes (Signed)
Patient has temp of 101.5,MD on call T. Callahan,PA notified,MD to order for blood culture.Will continue to monitor pt. [name] 11/22/2010

## 2010-11-22 NOTE — Progress Notes (Signed)
Subjective: No events overnight.   Objective:  Vital signs in last 24 hours:  Filed Vitals:   11/22/10 0700 11/22/10 1100 11/22/10 1115 11/22/10 1215  BP: 160/79 163/66 163/62 169/64  Pulse: 79 85 80 81  Temp: 98.1 F (36.7 C) 98.7 F (37.1 C) 98.6 F (37 C) 98.9 F (37.2 C)  TempSrc: Oral Oral Oral Oral  Resp: 18 18 18 18   Height:      Weight:      SpO2: 97% 95% 95% 95%    Intake/Output from previous day:   Intake/Output Summary (Last 24 hours) at 11/22/10 1242 Last data filed at 11/22/10 0630  Gross per 24 hour  Intake    525 ml  Output    100 ml  Net    425 ml    Physical Exam: General: Alert, awake, oriented x3, in no acute distress.  HEENT: anicteric sclera, pink conjunctiva, pupils equal and reactive to light and accomodation  Neck: supple, no masses or lymphadenopathy, no goiter, no bruits  Heart: Regular rate and rhythm, without murmurs, rubs or gallops.  Lungs: Clear to auscultation bilaterally, no wheezing, rales or rhonchi.  Abdomen: Soft, nontender, nondistended, positive bowel sounds, no masses.  Extremities: No clubbing, cyanosis or edema with positive pedal pulses.  Neuro: Grossly intact, no focal neurological deficits, strength 5/5 upper and lower extremities bilaterally  Psych: alert and oriented x 3, normal mood and affect  Skin: no rashes or lesions, warm and dry  Lab Results:  Basic Metabolic Panel:    Component Value Date/Time   NA 133* 11/22/2010 0335   K 4.1 11/22/2010 0335   CL 98 11/22/2010 0335   CO2 17* 11/22/2010 0335   BUN 92* 11/22/2010 0335   CREATININE 7.83* 11/22/2010 0335   GLUCOSE 200* 11/22/2010 0335   GLUCOSE 144* 01/13/2006 0000   CALCIUM 7.4* 11/22/2010 0335   CBC:    Component Value Date/Time   WBC 7.1 11/22/2010 0334   HGB 6.0* 11/22/2010 0334   HCT 17.9* 11/22/2010 0334   PLT 187 11/22/2010 0334   MCV 86.9 11/22/2010 0334   NEUTROABS 7.4 11/21/2010 2044   LYMPHSABS 1.2 11/21/2010 2044   MONOABS 1.2*  11/21/2010 2044   EOSABS 0.4 11/21/2010 2044   BASOSABS 0.1 11/21/2010 2044    Recent Results (from the past 240 hour(s))  MRSA PCR SCREENING     Status: Abnormal   Collection Time   11/22/10  2:32 AM      Component Value Range Status Comment   MRSA by PCR POSITIVE (*) NEGATIVE  Final     Studies/Results: Dg Chest 2 View  11/21/2010  IMPRESSION: No evidence of acute cardiopulmonary disease.    Medications: Scheduled Meds:   . acetaminophen  650 mg Oral Once  . amLODipine  5 mg Oral Daily  . aspirin  81 mg Oral Daily  . cefTRIAXone (ROCEPHIN) IV  1 g Intravenous Q24H  . cloNIDine  0.2 mg Oral TID  . estradiol  0.5 mg Oral Daily  . ferrous fumarate  1 tablet Oral Daily  . folic acid  400 mcg Oral Daily  . insulin aspart  0-5 Units Subcutaneous QHS  . insulin aspart  0-9 Units Subcutaneous TID WC  . levothyroxine  37.5 mcg Oral Custom  . levothyroxine  75 mcg Oral Custom  . metoprolol  75 mg Oral BID  . saccharomyces boulardii  250 mg Oral BID  . simvastatin  20 mg Oral QHS  . vitamin C  500  mg Oral Daily  . DISCONTD: levothyroxine  37.5 mcg Oral Custom   Continuous Infusions:   . sodium chloride 100 mL/hr at 11/22/10 0630  . DISCONTD: sodium chloride 125 mL/hr at 11/21/10 2101   PRN Meds:.acetaminophen, acetaminophen, ALPRAZolam, HYDROcodone-acetaminophen, HYDROmorphone, ondansetron (ZOFRAN) IV, ondansetron, senna-docusate  Assessment/Plan:  Acute renal failure on chronic kidney disease stage III:  - Pt has received 2L bolus NS but no significant change in Cr. We will continue Rocephin for now and follow up on preliminary workup ordered on admission which includes SPEP, UPEP, renal ultrasound\ - Consult nephrology for further recommendations - Monitor strict I's and O's, check BMP in AM  UTI/ urinary tract infection:  - Urine culture and blood cultures pending, continue Rocephin. Will adjust antibiotics according to the culture results. Add probiotic due to the  previous history of C. Difficile.   Marland KitchenHYPOTHYROIDISM: Continue home dose synthroid, TSH is WNL  .DIABETES MELLITUS, TYPE II: Not well controlled based on A1C. Will initiate basal insulin Lantus and continue SSI. Provide diabetic education  .HYPERLIPIDEMIA: Continue statin  .ANXIETY: Stable, continue alprazolam   .ANEMIA-NOS: Likely secondary to chronic kidney disease based on anemia panel, will follow up on SPEP, UPEP.  1 unit PRBC's transfused and will check CBC now and transfuse for Hg<7.5.  Marland KitchenHYPERTENSION: Continue home medication which included norvasc, beta blocker and clonidine   .CORONARY ARTERY DISEASE: Stable continue aspirin, beta blocker and statins   DVT prophylaxis: Bilateral SCDs   CODE STATUS I discussed in detail with the patient herself whose wishes a DO NOT RESUSCITATE    LOS: 1 day   MAGICK-Rula Keniston 11/22/2010, 12:42 PM

## 2010-11-23 LAB — GLUCOSE, CAPILLARY
Glucose-Capillary: 166 mg/dL — ABNORMAL HIGH (ref 70–99)
Glucose-Capillary: 176 mg/dL — ABNORMAL HIGH (ref 70–99)

## 2010-11-23 LAB — CBC
Hemoglobin: 8.9 g/dL — ABNORMAL LOW (ref 12.0–15.0)
MCHC: 34.4 g/dL (ref 30.0–36.0)
RBC: 3.02 MIL/uL — ABNORMAL LOW (ref 3.87–5.11)
WBC: 10.5 10*3/uL (ref 4.0–10.5)

## 2010-11-23 LAB — TYPE AND SCREEN
ABO/RH(D): O POS
Antibody Screen: NEGATIVE

## 2010-11-23 LAB — BASIC METABOLIC PANEL
BUN: 82 mg/dL — ABNORMAL HIGH (ref 6–23)
Chloride: 101 mEq/L (ref 96–112)
GFR calc Af Amer: 5 mL/min — ABNORMAL LOW (ref >90)
GFR calc non Af Amer: 5 mL/min — ABNORMAL LOW (ref >90)
Potassium: 3.5 mEq/L (ref 3.5–5.1)
Sodium: 135 mEq/L (ref 135–145)

## 2010-11-23 LAB — URINE CULTURE
Colony Count: >=100000
Culture  Setup Time: 201211162220
Culture  Setup Time: 201211171134

## 2010-11-23 MED ORDER — METHYLPREDNISOLONE SODIUM SUCC 125 MG IJ SOLR
125.0000 mg | Freq: Three times a day (TID) | INTRAMUSCULAR | Status: DC
  Administered 2010-11-23 – 2010-11-25 (×6): 125 mg via INTRAVENOUS
  Filled 2010-11-23 (×8): qty 2

## 2010-11-23 MED ORDER — GLUCOSE 40 % PO GEL
ORAL | Status: AC
  Administered 2010-11-23: 14:00:00
  Filled 2010-11-23: qty 1

## 2010-11-23 MED ORDER — INSULIN GLARGINE 100 UNIT/ML ~~LOC~~ SOLN
5.0000 [IU] | Freq: Every day | SUBCUTANEOUS | Status: DC
  Administered 2010-11-23 – 2010-11-24 (×2): 5 [IU] via SUBCUTANEOUS

## 2010-11-23 MED ORDER — FOLIC ACID 0.5 MG HALF TAB
0.5000 mg | ORAL_TABLET | Freq: Every day | ORAL | Status: DC
  Administered 2010-11-23 – 2010-11-28 (×6): 0.5 mg via ORAL
  Filled 2010-11-23 (×6): qty 1

## 2010-11-23 MED ORDER — METHYLPREDNISOLONE SODIUM SUCC 125 MG IJ SOLR: 125.0000 mg | Freq: Three times a day (TID) | INTRAMUSCULAR | Status: DC

## 2010-11-23 NOTE — Progress Notes (Signed)
Subjective: No events overnight. Pt reports feeling better, denies shortness of breath, chest pain, abdominal or urinary concerns.  Objective:  Vital signs in last 24 hours:  Filed Vitals:   11/22/10 2300 11/23/10 0100 11/23/10 0538 11/23/10 0900  BP:  160/66 150/67 206/82  Pulse:  72 74 98  Temp: 101 F (38.3 C) 98 F (36.7 C) 98 F (36.7 C)   TempSrc:  Oral Oral   Resp:  18 18 18   Height:      Weight:      SpO2:  95% 93% 96%    Intake/Output from previous day:   Intake/Output Summary (Last 24 hours) at 11/23/10 1232 Last data filed at 11/23/10 1100  Gross per 24 hour  Intake   1985 ml  Output   1500 ml  Net    485 ml    Physical Exam: General: Alert, awake, oriented x3, in no acute distress.  HEENT: anicteric sclera, pink conjunctiva, pupils equal and reactive to light and accomodation  Neck: supple, no masses or lymphadenopathy, no goiter, no bruits  Heart: Regular rate and rhythm, without murmurs, rubs or gallops.  Lungs: Clear to auscultation bilaterally, no wheezing, rales or rhonchi.  Abdomen: Soft, nontender, nondistended, positive bowel sounds, no masses.  Extremities: No clubbing, cyanosis or edema with positive pedal pulses.  Neuro: Grossly intact, no focal neurological deficits, strength 5/5 upper and lower extremities bilaterally   Lab Results:  Basic Metabolic Panel:    Component Value Date/Time   NA 135 11/23/2010 0500   K 3.5 11/23/2010 0500   CL 101 11/23/2010 0500   CO2 15* 11/23/2010 0500   BUN 82* 11/23/2010 0500   CREATININE 7.05* 11/23/2010 0500   GLUCOSE 58* 11/23/2010 0500   GLUCOSE 144* 01/13/2006 0000   CALCIUM 7.2* 11/23/2010 0500   CBC:    Component Value Date/Time   WBC 10.5 11/23/2010 0500   HGB 8.9* 11/23/2010 0500   HCT 25.9* 11/23/2010 0500   PLT 214 11/23/2010 0500   MCV 85.8 11/23/2010 0500   NEUTROABS 7.4 11/24/2010 2044   LYMPHSABS 1.2 11-24-2010 2044   MONOABS 1.2* 11/24/10 2044   EOSABS 0.4 24-Nov-2010 2044     BASOSABS 0.1 11-24-10 2044    Recent Results (from the past 240 hour(s))  URINE CULTURE     Status: Normal   Collection Time   11/24/2010  8:49 PM      Component Value Range Status Comment   Specimen Description URINE, RANDOM   Final    Special Requests NONE   Final    Setup Time [id number]   Final    Colony Count 15,000 COLONIES/ML   Final    Culture     Final    Value: Multiple bacterial morphotypes present, none predominant. Suggest appropriate recollection if clinically indicated.   Report Status 11/23/2010 FINAL   Final   MRSA PCR SCREENING     Status: Abnormal   Collection Time   11/22/10  2:32 AM      Component Value Range Status Comment   MRSA by PCR POSITIVE (*) NEGATIVE  Final     Studies/Results: Dg Chest 2 View 11/24/2010  IMPRESSION: No evidence of acute cardiopulmonary disease.   US Renal 11/22/2010  IMPRESSION: Increased parenchymal echogenicity, suggesting medical renal disease.  No hydronephrosis.  Small right pleural effusion.     Medications: Scheduled Meds:   . amLODipine  5 mg Oral Daily  . aspirin  81 mg Oral Daily  . cefTRIAXone (ROCEPHIN) IV  1 g Intravenous Q24H  . cloNIDine  0.2 mg Oral TID  . dextrose      . estradiol  0.5 mg Oral Daily  . feeding supplement  237 mL Oral TID BM  . ferrous fumarate  1 tablet Oral Daily  . folic acid  0.5 mg Oral Daily  . insulin aspart  0-5 Units Subcutaneous QHS  . insulin aspart  0-9 Units Subcutaneous TID WC  . insulin glargine  10 Units Subcutaneous QHS  . levothyroxine  37.5 mcg Oral Custom  . levothyroxine  75 mcg Oral Custom  . metoprolol  75 mg Oral BID  . saccharomyces boulardii  250 mg Oral BID  . simvastatin  20 mg Oral QHS  . vitamin C  500 mg Oral Daily  . DISCONTD: folic acid  400 mcg Oral Daily   Continuous Infusions:   . sodium chloride 100 mL/hr at 11/23/10 0124   PRN Meds:.acetaminophen, acetaminophen, ALPRAZolam, HYDROcodone-acetaminophen, HYDROmorphone, ondansetron (ZOFRAN)  IV, ondansetron, senna-docusate  Assessment/Plan:  Principal Problem:  *Renal failure - renal US suggestive of medical renal disease, creatinine is now trending down. Further work up is still pending including SPEP, UPEP, complement levels, ANA. Will continue to monitor renal panel and will follow up on nephrology recommendations.  Active Problems:  HYPOTHYROIDISM - continue synthroid   DIABETES MELLITUS, TYPE II - one hypoglycemic episode, will decrease Lantuss dose   HYPERLIPIDEMIA - continue statin   ANEMIA - remains stable and at pt's baseline   HYPERTENSION - continue same medication regimen   UTI (lower urinary tract infection) - continue Rocephin    LOS: 2 days   MAGICK-Atha Mcbain 11/23/2010, 12:32 PM

## 2010-11-23 NOTE — Consults (Addendum)
[name] 11/23/2010 [name] Requesting Physician:  Dr. Aldine [name]  Chief Complaint:  Renal failure HPI: The patient is a 77 y.o. year-old female with hx of DM2, CAD with CABG/AVR (tissue) in 2010, PVD and HTN.  The patient presented to PCP's c/o intermittent N/V for the last week.  Also had urinary frequency, urgency and dysuria.  Started on cipro for UTI 1d PTA.  Office labs returned creatinine of 7 and pt admitted to the hospital.  Creatinine was low 3's in [name], but in June 2012 creatinine was 1.2-1.4.   The patient otherwise feels well and denies any confusion.  She lives alone, is very active, drives.  Her son is here today. Here in the hospital UOP has improved with IVF"s and creatinine down today slightly.    Past Medical History:  Past Medical History  Diagnosis Date  . Anemia     NOS  . Personal history of colonic polyps   . Diabetes mellitus     type II  . GERD (gastroesophageal reflux disease)   . Hyperlipidemia   . Hypertension   . Hypothyroidism   . Osteoarthritis   . Osteopenia   . Urinary incontinence   . Pulmonary hypertension   . Anxiety   . Renal insufficiency   . Coronary artery disease   . Aortic stenosis     Past Surgical History:  Past Surgical History  Procedure Date  . Abdominal hysterectomy   . Tonsillectomy   . Cardiac catheterization 2000    cad  . Replacement total knee bilateral 05/1998  . Cataract extraction   . Coronary artery bypass graft     od  . Adenosine myoview 2007    benign, EF 69%  . Carotid endarterectomy 2011    Right  . Coronary artery bypass graft 2009  . Aortic valve replacement 2009  . Amputation-left great toe 7/12    Dr [name]    Family History: History reviewed. No pertinent family history. Social History:  reports that she has quit smoking. Her smoking use included Cigarettes. She quit after 15 years of use. She has never used smokeless tobacco. She reports that she drinks alcohol. She reports that she does  not use illicit drugs. She lives by herself, drives, is quite active per pts son.  Allergies:  Allergies  Allergen Reactions  . Captopril     REACTION: unspecified  . Enalapril Maleate     REACTION: cough  . Nitrofurantoin     REACTION: itching  . Ramipril     REACTION: unspecified  . Sulfa Antibiotics Other (See Comments)    Reaction unknown  . Verapamil     REACTION: unspecified    Home medications: Prior to Admission medications   Medication Sig Start Date End Date Taking? Authorizing Provider  ALPRAZolam Prudy Feeler) 0.5 MG tablet Take 0.25 mg by mouth at bedtime as needed. For anxiety   Yes Historical Provider, MD  amLODipine (NORVASC) 5 MG tablet Take 1 tablet (5 mg total) by mouth daily. 08/01/10 08/01/11 Yes Valera Castle, MD  Ascorbic Acid (VITAMIN C) 500 MG CHEW Chew 1 tablet by mouth daily.     Yes Historical Provider, MD  aspirin 81 MG tablet Take 81 mg by mouth daily.     Yes Historical Provider, MD  ciprofloxacin (CIPRO) 250 MG tablet Take 250 mg by mouth every other day. For 3 days (last dose is 11/22/2010)    Yes Historical Provider, MD  cloNIDine (CATAPRES) 0.2 MG tablet Take 0.2 mg  by mouth 3 (three) times daily.  07/11/10  Yes Historical Provider, MD  estradiol (ESTRACE) 0.5 MG tablet Take 0.5 mg by mouth daily.     Yes Historical Provider, MD  ferrous fumarate (HEMOCYTE - 106 MG FE) 325 (106 FE) MG TABS Take 1 tablet by mouth daily.     Yes Historical Provider, MD  folic acid (FOLVITE) 800 MCG tablet Take 400 mcg by mouth daily.     Yes Historical Provider, MD  levothyroxine (SYNTHROID, LEVOTHROID) 75 MCG tablet Take 37.5-75 mcg by mouth daily. Take 37.5 mg on tues and thurs, 75 mg all other days   Yes Historical Provider, MD  metoprolol (LOPRESSOR) 50 MG tablet Take 1.5 tablets (75 mg total) by mouth 2 (two) times daily. 08/01/10  Yes Valera Castle, MD  simvastatin (ZOCOR) 20 MG tablet Take 1 tablet (20 mg total) by mouth at bedtime. 08/12/10  Yes Valera Castle, MD  acetaminophen  (TYLENOL) 500 MG tablet Take 500 mg by mouth every 6 (six) hours as needed. For pain     Historical Provider, MD    Inpatient medications:    . amLODipine  5 mg Oral Daily  . aspirin  81 mg Oral Daily  . cefTRIAXone (ROCEPHIN) IV  1 g Intravenous Q24H  . cloNIDine  0.2 mg Oral TID  . dextrose      . estradiol  0.5 mg Oral Daily  . feeding supplement  237 mL Oral TID BM  . ferrous fumarate  1 tablet Oral Daily  . folic acid  0.5 mg Oral Daily  . insulin aspart  0-5 Units Subcutaneous QHS  . insulin aspart  0-9 Units Subcutaneous TID WC  . insulin glargine  10 Units Subcutaneous QHS  . levothyroxine  37.5 mcg Oral Custom  . levothyroxine  75 mcg Oral Custom  . metoprolol  75 mg Oral BID  . saccharomyces boulardii  250 mg Oral BID  . simvastatin  20 mg Oral QHS  . vitamin C  500 mg Oral Daily  . DISCONTD: folic acid  400 mcg Oral Daily    Review of Systems Gen:  Denies headache, fever, chills, sweats.  No weight loss. HEENT:  No visual change, sore throat, difficulty swallowing. Resp:  No difficulty breathing, DOE.  No cough or hemoptysis. Cardiac:  No chest pain, orthopnea, PND.  Intermittent edema of lower extremities. GI:   Denies abdominal pain.  No constipation. GU:  See above. MS:  Denies joint pain or swelling.   Derm:  Denies skin rash or itching.  No chronic skin conditions.  Neuro:   Denies focal weakness, memory problems, hx stroke or TIA.   Psych:  Denies symptoms of depression of anxiety.  No hallucination.    Physical Exam:  Blood pressure 206/82, pulse 98, temperature 98 F (36.7 C), temperature source Oral, resp. rate 18, height 5\' 3"  (1.6 m), weight 48.943 kg (107 lb 14.4 oz), SpO2 96.00%.  Gen: alert, well nourished, healthy appearing elderly female Skin: no rash or cyanosis, no petechiae or purpura Neck: JVP about 14 cm, scar from previous R CEA Chest: CTA bilat Heart: Reg, soft SEM no rub or gallop Abdomen: soft, NT, no ascites no masses Ext: no edema.   Left great toe amp. No ulcer or gangrene. Neuro: alert, 0x3, nonfocal exam. Heme/Lymph: no nodes or bruising Psych: pleasant, calm  Labs Urinalysis:  Urine sediment showed sheets of RBC's and WBC's.  Not a lot of bacteria.  Scattered granular casts and some casts  suspicious for degenerating RBC/WBC casts.    CMP     Component Value Date/Time   NA 135 11/23/2010 0500   K 3.5 11/23/2010 0500   CL 101 11/23/2010 0500   CO2 15* 11/23/2010 0500   GLUCOSE 58* 11/23/2010 0500   GLUCOSE 144* 01/13/2006 0000   BUN 82* 11/23/2010 0500   CREATININE 7.05* 11/23/2010 0500   CALCIUM 7.2* 11/23/2010 0500   PROT 9.4* 11/21/2010 2044   ALBUMIN 3.0* 11/21/2010 2044   AST 10 11/21/2010 2044   ALT <5 11/21/2010 2044   ALKPHOS 122* 11/21/2010 2044   BILITOT 0.2* 11/21/2010 2044   GFRNONAA 5* 11/23/2010 0500   GFRAA 5* 11/23/2010 0500     CBC    Component Value Date/Time   WBC 10.5 11/23/2010 0500   RBC 3.02* 11/23/2010 0500   HGB 8.9* 11/23/2010 0500   HCT 25.9* 11/23/2010 0500   PLT 214 11/23/2010 0500   MCV 85.8 11/23/2010 0500   MCH 29.5 11/23/2010 0500   MCHC 34.4 11/23/2010 0500   RDW 13.4 11/23/2010 0500   LYMPHSABS 1.2 11/21/2010 2044   MONOABS 1.2* 11/21/2010 2044   EOSABS 0.4 11/21/2010 2044   BASOSABS 0.1 11/21/2010 2044    liver function tests ABG    Component Value Date/Time   PHART 7.321* 01/27/2007 1640   PCO2ART 47.2* 01/27/2007 1640   PO2ART 140.0* 01/27/2007 0020   HCO3 26.0* 02/04/2007 0102   TCO2 20 05/17/2010 2058   ACIDBASEDEF 2.0 01/27/2007 1640   O2SAT 99.0 01/27/2007 0020      CBG (last 3)   Basename 11/23/10 0823 11/23/10 0804 11/23/10 0737  GLUCAP 83 60* 49*    Dg Chest 2 View  11/21/2010  *RADIOLOGY REPORT*  Clinical Data: Prior aortic valve replacement, abnormal labs  CHEST - 2 VIEW  Comparison: 07/18/2010  Findings: Chronic interstitial markings. No pleural effusion or pneumothorax.  The heart is top normal in size. Postsurgical changes related  to prior CABG.  Aortic valve replacement.  Mild degenerative changes with curvature of the visualized thoracolumbar spine.  IMPRESSION: No evidence of acute cardiopulmonary disease.  Original Report Authenticated By: Charline Bills, M.[name].   US Renal  11/22/2010  *RADIOLOGY REPORT*  Clinical Data: Acute on chronic renal failure, evaluate for obstruction  RENAL/URINARY TRACT ULTRASOUND COMPLETE  Comparison:  CT abdomen/pelvis dated 06/09/2010  Findings:  Right Kidney:  Measures 10.2 cm.  No mass or hydronephrosis. Increased parenchymal echogenicity, suggesting medical renal disease.  Left Kidney:  Measures 10.5 cm.  No mass or hydronephrosis. Increased parenchymal density, suggesting medical renal disease.  Bladder:  Decompressed by indwelling Foley catheter.  Additional comments:  Small right pleural effusion.  IMPRESSION: Increased parenchymal echogenicity, suggesting medical renal disease.  No hydronephrosis.  Small right pleural effusion.  Original Report Authenticated By: Charline Bills, M.[name].     Assessment/Plan 1)  Renal failure.  Progressive renal failure over last 5 months.  Suspect intrinsic renal disease, possible GN.  Normal creat in June, but 3's in [name].  Suspicious urinary sediment for GN.  Also somewhat high total proetin may indicate monoclonal gammopathy.  US show medical renal disease, kidneys are symmetric.  She may have irreversible disease, but patient is reasonably healthy and fully functional so will recommend aggressive therapy for now.  Will start empiric steroids, order serologies, hydrate, and hold off on dialysis for now as she is tolerating the azotemia well for the time being.  2)  DM 2, on insulin. 3)  HTN.  Volume looks  euvolemic.  Cont IVF's.  4)  CAD , hx CABG 2010 5)  AS, s/p porcine AVR 2010 6)  Anemia, likely secondary to RF.  Fe sat 29% which is adequate.  Will start ESA.  7)  Hypothyroidism 8)  R/O UTI.  Culture pending, on rocephin.   Plan:  Serologies,  IVF's, Fe studies, PTH, vit [name] level, SPEP and UPEP.  Margaret Cockerill [name] 11/23/2010, 11:41 AM

## 2010-11-24 LAB — CBC
Hemoglobin: 8.2 g/dL — ABNORMAL LOW (ref 12.0–15.0)
MCH: 28.9 pg (ref 26.0–34.0)
MCHC: 34 g/dL (ref 30.0–36.0)
RDW: 13.5 % (ref 11.5–15.5)

## 2010-11-24 LAB — GLUCOSE, CAPILLARY
Glucose-Capillary: 126 mg/dL — ABNORMAL HIGH (ref 70–99)
Glucose-Capillary: 205 mg/dL — ABNORMAL HIGH (ref 70–99)
Glucose-Capillary: 217 mg/dL — ABNORMAL HIGH (ref 70–99)

## 2010-11-24 LAB — RENAL FUNCTION PANEL
Albumin: 1.9 g/dL — ABNORMAL LOW (ref 3.5–5.2)
BUN: 78 mg/dL — ABNORMAL HIGH (ref 6–23)
Chloride: 102 mEq/L (ref 96–112)
GFR calc Af Amer: 6 mL/min — ABNORMAL LOW (ref >90)
Glucose, Bld: 179 mg/dL — ABNORMAL HIGH (ref 70–99)
Potassium: 3.8 mEq/L (ref 3.5–5.1)
Sodium: 135 mEq/L (ref 135–145)

## 2010-11-24 LAB — PROTEIN / CREATININE RATIO, URINE
Creatinine, Urine: 33.06 mg/dL
Total Protein, Urine: 137.7 mg/dL

## 2010-11-24 LAB — HEPATITIS PANEL, ACUTE
Hep A IgM: NEGATIVE
Hep B C IgM: NEGATIVE
Hepatitis B Surface Ag: NEGATIVE

## 2010-11-24 MED ORDER — AMLODIPINE BESYLATE 10 MG PO TABS
10.0000 mg | ORAL_TABLET | Freq: Every day | ORAL | Status: DC
  Administered 2010-11-24 – 2010-11-28 (×5): 10 mg via ORAL
  Filled 2010-11-24 (×5): qty 1

## 2010-11-24 MED ORDER — HYDRALAZINE HCL 20 MG/ML IJ SOLN
10.0000 mg | Freq: Three times a day (TID) | INTRAMUSCULAR | Status: DC | PRN
  Filled 2010-11-24: qty 0.5

## 2010-11-24 MED ORDER — HYDRALAZINE HCL 20 MG/ML IJ SOLN
10.0000 mg | Freq: Three times a day (TID) | INTRAMUSCULAR | Status: DC
  Administered 2010-11-25: 0.5 mg via INTRAVENOUS
  Administered 2010-11-25 – 2010-11-28 (×12): 10 mg via INTRAVENOUS
  Filled 2010-11-24 (×14): qty 0.5

## 2010-11-24 NOTE — Progress Notes (Signed)
Patient ID: [name], female   DOB: 12-Mar-1920, 77 y.o.   MRN: [id number]  Subjective: No events overnight.   Objective:  Vital signs in last 24 hours:  Filed Vitals:   11/24/10 1005 11/24/10 1357 11/24/10 1622 11/24/10 2200  BP: 170/68 185/82  176/76  Pulse: 76 76 70 74  Temp: 98.1 F (36.7 C) 97.7 F (36.5 C)  97.6 F (36.4 C)  TempSrc: Oral Oral  Oral  Resp: 20 20 20 19   Height:      Weight:      SpO2: 97% 95% 96% 99%    Intake/Output from previous day:   Intake/Output Summary (Last 24 hours) at 11/24/10 2345 Last data filed at 11/24/10 1426  Gross per 24 hour  Intake 1178.33 ml  Output    175 ml  Net 1003.33 ml    Physical Exam: General: Alert, awake, oriented x3, in no acute distress. HEENT: No bruits, no goiter. Heart: Regular rate and rhythm, without murmurs, rubs, gallops. Lungs: Clear to auscultation bilaterally. Abdomen: Soft, nontender, nondistended, positive bowel sounds. Extremities: No clubbing cyanosis or edema with positive pedal pulses. Neuro: Grossly intact, nonfocal.   Lab Results:  Basic Metabolic Panel:    Component Value Date/Time   NA 135 11/24/2010 0620   K 3.8 11/24/2010 0620   CL 102 11/24/2010 0620   CO2 13* 11/24/2010 0620   BUN 78* 11/24/2010 0620   CREATININE 6.54* 11/24/2010 0620   GLUCOSE 179* 11/24/2010 0620   GLUCOSE 144* 01/13/2006 0000   CALCIUM 7.0* 11/24/2010 0620   CBC:    Component Value Date/Time   WBC 7.7 11/24/2010 0620   HGB 8.2* 11/24/2010 0620   HCT 24.1* 11/24/2010 0620   PLT 194 11/24/2010 0620   MCV 84.9 11/24/2010 0620   NEUTROABS 7.4 11/21/2010 2044   LYMPHSABS 1.2 11/21/2010 2044   MONOABS 1.2* 11/21/2010 2044   EOSABS 0.4 11/21/2010 2044   BASOSABS 0.1 11/21/2010 2044    Recent Results (from the past 240 hour(s))  URINE CULTURE     Status: Normal   Collection Time   11/21/10  8:49 PM      Component Value Range Status Comment   Specimen Description URINE, RANDOM   Final    Special  Requests NONE   Final    Setup Time [id number]   Final    Colony Count 15,000 COLONIES/ML   Final    Culture     Final    Value: Multiple bacterial morphotypes present, none predominant. Suggest appropriate recollection if clinically indicated.   Report Status 11/23/2010 FINAL   Final   MRSA PCR SCREENING     Status: Abnormal   Collection Time   11/22/10  2:32 AM      Component Value Range Status Comment   MRSA by PCR POSITIVE (*) NEGATIVE  Final   URINE CULTURE     Status: Normal   Collection Time   11/22/10  4:26 AM      Component Value Range Status Comment   Specimen Description URINE, CATHETERIZED   Final    Special Requests NONE   Final    Setup Time [id number]   Final    Colony Count >=100,000 COLONIES/ML   Final    Culture     Final    Value: GROUP B STREP(S.AGALACTIAE)ISOLATED     Note: TESTING AGAINST S. AGALACTIAE NOT ROUTINELY PERFORMED DUE TO PREDICTABILITY OF AMP/PEN/VAN SUSCEPTIBILITY.   Report Status 11/23/2010 FINAL   Final   CULTURE,  BLOOD (ROUTINE X 2)     Status: Normal (Preliminary result)   Collection Time   11/23/10  1:30 AM      Component Value Range Status Comment   Specimen Description BLOOD RIGHT ARM   Final    Special Requests BOTTLES DRAWN AEROBIC AND ANAEROBIC 10CC EA   Final    Setup Time [id number]   Final    Culture     Final    Value:        BLOOD CULTURE RECEIVED NO GROWTH TO DATE CULTURE WILL BE HELD FOR 5 DAYS BEFORE ISSUING A FINAL NEGATIVE REPORT   Report Status PENDING   Incomplete   CULTURE, BLOOD (ROUTINE X 2)     Status: Normal (Preliminary result)   Collection Time   11/23/10  1:33 AM      Component Value Range Status Comment   Specimen Description BLOOD LEFT HAND   Final    Special Requests     Final    Value: BOTTLES DRAWN AEROBIC AND ANAEROBIC 10CC BLUE 5CC RED   Setup Time [id number]   Final    Culture     Final    Value:        BLOOD CULTURE RECEIVED NO GROWTH TO DATE CULTURE WILL BE HELD FOR 5 DAYS BEFORE ISSUING A  FINAL NEGATIVE REPORT   Report Status PENDING   Incomplete     Studies/Results: No results found.  Medications: Scheduled Meds:   . amLODipine  10 mg Oral Daily  . aspirin  81 mg Oral Daily  . cefTRIAXone (ROCEPHIN) IV  1 g Intravenous Q24H  . cloNIDine  0.2 mg Oral TID  . estradiol  0.5 mg Oral Daily  . feeding supplement  237 mL Oral TID BM  . ferrous fumarate  1 tablet Oral Daily  . folic acid  0.5 mg Oral Daily  . insulin aspart  0-5 Units Subcutaneous QHS  . insulin aspart  0-9 Units Subcutaneous TID WC  . insulin glargine  5 Units Subcutaneous QHS  . levothyroxine  37.5 mcg Oral Custom  . levothyroxine  75 mcg Oral Custom  . methylPREDNISolone (SOLU-MEDROL) injection  125 mg Intravenous Q8H  . metoprolol  75 mg Oral BID  . saccharomyces boulardii  250 mg Oral BID  . simvastatin  20 mg Oral QHS  . vitamin C  500 mg Oral Daily  . DISCONTD: amLODipine  5 mg Oral Daily   Continuous Infusions:   . sodium chloride 100 mL/hr at 11/24/10 2234   PRN Meds:.acetaminophen, acetaminophen, ALPRAZolam, hydrALAZINE, HYDROcodone-acetaminophen, HYDROmorphone, ondansetron (ZOFRAN) IV, ondansetron, senna-docusate  Assessment/Plan:  Principal Problem:  *Renal failure - creatinine trending down, will follow up on nephrology recommendations  Active Problems:  HYPOTHYROIDISM - continue home medication regimen   DIABETES MELLITUS, TYPE II - well controlled during hospitalization   HYPERLIPIDEMIA - continue statin   ANEMIA - obtain CBC in AM    HYPERTENSION - initiate Hydralazine and renally dose   UTI (lower urinary tract infection) - continue antibiotic as noted above    \ LOS: 3 days   MAGICK-Domino Holten 11/24/2010, 11:45 PM

## 2010-11-24 NOTE — Progress Notes (Signed)
Inpatient Diabetes Program Recommendations  AACE/ADA: New Consensus Statement on Inpatient Glycemic Control (2009)  Target Ranges:  Prepandial:   less than 140 mg/dL      Peak postprandial:   less than 180 mg/dL (1-2 hours)      Critically ill patients:  140 - 180 mg/dL   Reason for Visit: Consult for uncontrolled diabetes  Inpatient Diabetes Program Recommendations Insulin - Meal Coverage: Add Novolog 2 units TID while in steroids and in hospital Oral Agents: A1C: 8.5%: Consider addition of DPP-4 Inhibitor to home medication regimen based on elevated A1C, renal insufficiency and age  Note:  Patient for skilled nursing facility placement.

## 2010-11-24 NOTE — Progress Notes (Signed)
Clinical social work received referral for skilled nursing placement. Clinical social worker reviewed pt chart where physical therapy recommended a change in disposition of patient returning home alone with physical therapy through home health services and discussion with patient and patient son. Clinical social work will follow up with RN case Production designer, theatre/television/film. No further CSW needs, signing off.   Catha Gosselin, Theresia Majors  [phone] .11/24/2010 17:27

## 2010-11-24 NOTE — Progress Notes (Signed)
UTILIZATION REVIEW COMPLETE [name] 11/24/2010 [phone] OR [phone]  

## 2010-11-24 NOTE — Progress Notes (Signed)
Occupational Therapy Evaluation Patient Details Name: [name] MRN: [id number] DOB: 02-Oct-1920 Today's Date: 11/24/2010  OT Goals Acute Rehab OT Goals Time For Goal Achievement: 2 weeks ADL Goals Pt Will Perform Grooming: with set-up;Standing at sink ADL Goal: Grooming - Progress: Progressing toward goals Pt Will Transfer to Toilet: with supervision;Comfort height toilet;Grab bars ADL Goal: Toilet Transfer - Progress: Progressing toward goals Pt Will Perform Toileting - Clothing Manipulation: with supervision ADL Goal: Toileting - Clothing Manipulation - Progress: Progressing toward goals Pt Will Perform Toileting - Hygiene: with supervision ADL Goal: Toileting - Hygiene - Progress: Progressing toward goals Pt Will Perform Tub/Shower Transfer: Shower transfer ADL Goal: Web designer - Progress: Progressing toward goals  OT Treatment Precautions/Restrictions    Sensation Light Touch: Appears Intact ADL ADL Eating/Feeding: Supervision/safety Where Assessed - Eating/Feeding: Edge of bed Grooming: Brushing hair;Set up Where Assessed - Grooming: Sitting, bed Upper Body Bathing: Supervision/safety Where Assessed - Upper Body Bathing: Sitting, bed;Unsupported Lower Body Bathing: Performed;Moderate assistance Lower Body Bathing Details (indicate cue type and reason): pt leaned posterior with sit to stand Where Assessed - Lower Body Bathing: Sit to stand from chair Upper Body Dressing: Minimal assistance Where Assessed - Upper Body Dressing: Sit to stand from chair Lower Body Dressing: Moderate assistance Lower Body Dressing Details (indicate cue type and reason): pt leaned posterior with sit to stand Where Assessed - Lower Body Dressing: Sit to stand from chair Toilet Transfer: Other (comment);Moderate assistance (transfer bed to chair with walker) Toilet Transfer Details (indicate cue type and reason): pt leaning posterior Toilet Transfer Method: Ambulating Toileting -  Hygiene: Moderate assistance Where Assessed - Toileting Hygiene: Standing     End of Session OT - End of Session Equipment Utilized During Treatment: Other (comment) (walker) Activity Tolerance: Patient tolerated treatment well Patient left: in chair General Behavior During Session: [facility] for tasks performed Cognition: [facility] for tasks performed  Kirt Boys  11/24/2010, 2:53 PM

## 2010-11-24 NOTE — Progress Notes (Signed)
Physical Therapy Treatment Patient Details Name: [name] MRN: [id number] DOB: Sep 22, 1920 Today's Date: 11/24/2010  PT Assessment/Plan  PT - Assessment/Plan Comments on Treatment Session: Pt much improved from previous session.  Should be appropriate for return to home alone. Spoke at length with pt and her son about home safety measures to reduce risk of falling. Suggesting HHPT for home safety eval and progression of strengthening program. PT Plan: Discharge plan needs to be updated PT Frequency: Min 3X/week Follow Up Recommendations: Home health PT Equipment Recommended: None recommended by PT PT Goals  Acute Rehab PT Goals PT Goal Formulation: With patient/family Time For Goal Achievement: 2 weeks Pt will go Supine/Side to Sit: with modified independence PT Goal: Supine/Side to Sit - Progress: Progressing toward goal Pt will go Sit to Supine/Side: with modified independence PT Goal: Sit to Supine/Side - Progress: Progressing toward goal Pt will Transfer Sit to Stand/Stand to Sit: with modified independence PT Transfer Goal: Sit to Stand/Stand to Sit - Progress: Progressing toward goal Pt will Ambulate: 51 - 150 feet;with least restrictive assistive device;with modified independence PT Goal: Ambulate - Progress: Progressing toward goal  PT Treatment Precautions/Restrictions  Precautions Precautions: Fall Restrictions Weight Bearing Restrictions: No Mobility (including Balance) Bed Mobility Bed Mobility: Yes Supine to Sit: 7: Independent Transfers Transfers: Yes Sit to Stand: 6: Modified independent (Device/Increase time) Stand to Sit: 6: Modified independent (Device/Increase time) Ambulation/Gait Ambulation/Gait: Yes Ambulation/Gait Assistance: 6: Modified independent (Device/Increase time) Ambulation/Gait Assistance Details (indicate cue type and reason): no assistance required.  Pt took 2 standing rest breaks for < 30 seconds each Ambulation Distance (Feet): 150  Feet Assistive device: Rolling walker;None (30 feet with no AD 150 with RW) Gait Pattern: Step-through pattern;Decreased stride length (WFL with RW, decreased step length with no AD) Stairs: No Wheelchair Mobility Wheelchair Mobility: No  Posture/Postural Control Posture/Postural Control: No significant limitations Balance Balance Assessed: No Exercise    End of Session PT - End of Session Equipment Utilized During Treatment: Gait belt Activity Tolerance: Patient tolerated treatment well Patient left: in bed;with family/visitor present;with call bell in reach (son present) Nurse Communication: Mobility status for transfers;Mobility status for ambulation General Behavior During Session: [facility] for tasks performed Cognition: [facility] for tasks performed  Shani Fitch L. Swain Acree DPT [phone] 11/24/2010, 5:14 PM

## 2010-11-24 NOTE — Progress Notes (Signed)
S:Reports to be feeling well and had a restful night. Nausea resolved. Denies shortness of breath or chest pain.  O:BP 170/72  Pulse 67  Temp(Src) 97.9 F (36.6 C) (Oral)  Resp 17  Ht 5\' 3"  (1.6 m)  Wt 49.215 kg (108 lb 8 oz)  BMI 19.22 kg/m2  SpO2 96%  Intake/Output Summary (Last 24 hours) at 11/24/10 0759 Last data filed at 11/24/10 4098  Gross per 24 hour  Intake 1818.33 ml  Output   1250 ml  Net 568.33 ml   Weight change: 0.272 kg (9.6 oz) JXB:JYNWGNFAOZH eating breakfast in bed YQM:VHQIO RRR, Soft HSM 2/6 Resp:CTA bilaterally, no rales/rhonchi NGE:XBMW, obese, Non tender, normal bowel sounds Ext:No LE edema      . amLODipine  5 mg Oral Daily  . aspirin  81 mg Oral Daily  . cefTRIAXone (ROCEPHIN) IV  1 g Intravenous Q24H  . cloNIDine  0.2 mg Oral TID  . dextrose      . estradiol  0.5 mg Oral Daily  . feeding supplement  237 mL Oral TID BM  . ferrous fumarate  1 tablet Oral Daily  . folic acid  0.5 mg Oral Daily  . insulin aspart  0-5 Units Subcutaneous QHS  . insulin aspart  0-9 Units Subcutaneous TID WC  . insulin glargine  5 Units Subcutaneous QHS  . levothyroxine  37.5 mcg Oral Custom  . levothyroxine  75 mcg Oral Custom  . methylPREDNISolone (SOLU-MEDROL) injection  125 mg Intravenous Q8H  . metoprolol  75 mg Oral BID  . saccharomyces boulardii  250 mg Oral BID  . simvastatin  20 mg Oral QHS  . vitamin C  500 mg Oral Daily  . DISCONTD: insulin glargine  10 Units Subcutaneous QHS  . DISCONTD: methylPREDNISolone (SOLU-MEDROL) injection  125 mg Intravenous Q8H   US Renal  11/22/2010  *RADIOLOGY REPORT*  Clinical Data: Acute on chronic renal failure, evaluate for obstruction  RENAL/URINARY TRACT ULTRASOUND COMPLETE  Comparison:  CT abdomen/pelvis dated 06/09/2010  Findings:  Right Kidney:  Measures 10.2 cm.  No mass or hydronephrosis. Increased parenchymal echogenicity, suggesting medical renal disease.  Left Kidney:  Measures 10.5 cm.  No mass or  hydronephrosis. Increased parenchymal density, suggesting medical renal disease.  Bladder:  Decompressed by indwelling Foley catheter.  Additional comments:  Small right pleural effusion.  IMPRESSION: Increased parenchymal echogenicity, suggesting medical renal disease.  No hydronephrosis.  Small right pleural effusion.  Original Report Authenticated By: Charline Bills, M.D.   BMET    Component Value Date/Time   NA 135 11/24/2010 0620   K 3.8 11/24/2010 0620   CL 102 11/24/2010 0620   CO2 13* 11/24/2010 0620   GLUCOSE 179* 11/24/2010 0620   GLUCOSE 144* 01/13/2006 0000   BUN 78* 11/24/2010 0620   CREATININE 6.54* 11/24/2010 0620   CALCIUM 7.0* 11/24/2010 0620   GFRNONAA 5* 11/24/2010 0620   GFRAA 6* 11/24/2010 0620   CBC    Component Value Date/Time   WBC 7.7 11/24/2010 0620   RBC 2.84* 11/24/2010 0620   HGB 8.2* 11/24/2010 0620   HCT 24.1* 11/24/2010 0620   PLT 194 11/24/2010 0620   MCV 84.9 11/24/2010 0620   MCH 28.9 11/24/2010 0620   MCHC 34.0 11/24/2010 0620   RDW 13.5 11/24/2010 0620   LYMPHSABS 1.2 11/21/2010 2044   MONOABS 1.2* 11/21/2010 2044   EOSABS 0.4 11/21/2010 2044   BASOSABS 0.1 11/21/2010 2044    Assessment/Plan: 1)Acute Renal failure. Progressive renal failure over last  5 months. Suspected intrinsic renal disease, possible GN vs AIN. Creatinine improving and urine output is in the non-oliguric range. Unclear whether this improved creatinine is from Fluids/Volume or solumedrol. Will decide on kidney biopsy in the next 48hrs based on serology results and/or renal function. Vasculitis (ANCA) would be a big concern at her age. No pulmonary symptoms identified.  2) DM 2, on insulin, fairly controlled in spite of corticosteroids.  3) HTN. Clinically euvolemic and hypertensive. Cont IVF's and up titrate norvasc to 10mq QHS.  4) CAD , hx CABG 2010  5) AS, s/p porcine AVR 2010  6) Anemia, likely secondary to RF. Fe sat 29% which is adequate. Will start ESA.  7)  Hypothyroidism  8) R/O UTI. Culture pending, on rocephin   [name] K.

## 2010-11-24 NOTE — Plan of Care (Signed)
Problem: Phase II Progression Outcomes Goal: Discharge plan established Pt will need STSNF for therapy unless she has 24/7 assist

## 2010-11-25 LAB — UIFE/LIGHT CHAINS/TP QN, 24-HR UR
Albumin, U: DETECTED
Alpha 2, Urine: DETECTED — AB
Beta, Urine: DETECTED — AB
Free Kappa/Lambda Ratio: 3.02 ratio (ref 2.04–10.37)
Total Protein, Urine: 79 mg/dL

## 2010-11-25 LAB — RENAL FUNCTION PANEL
CO2: 12 mEq/L — ABNORMAL LOW (ref 19–32)
Calcium: 7.1 mg/dL — ABNORMAL LOW (ref 8.4–10.5)
GFR calc Af Amer: 6 mL/min — ABNORMAL LOW (ref >90)
GFR calc non Af Amer: 5 mL/min — ABNORMAL LOW (ref >90)
Glucose, Bld: 264 mg/dL — ABNORMAL HIGH (ref 70–99)
Phosphorus: 7.9 mg/dL — ABNORMAL HIGH (ref 2.3–4.6)
Potassium: 3.6 mEq/L (ref 3.5–5.1)
Sodium: 132 mEq/L — ABNORMAL LOW (ref 135–145)

## 2010-11-25 LAB — CBC
HCT: 21.4 % — ABNORMAL LOW (ref 36.0–46.0)
MCHC: 35 g/dL (ref 30.0–36.0)
MCV: 84.9 fL (ref 78.0–100.0)
Platelets: 182 10*3/uL (ref 150–400)
RDW: 13.5 % (ref 11.5–15.5)
WBC: 5.1 10*3/uL (ref 4.0–10.5)

## 2010-11-25 LAB — GLUCOSE, CAPILLARY: Glucose-Capillary: 291 mg/dL — ABNORMAL HIGH (ref 70–99)

## 2010-11-25 MED ORDER — CHLORHEXIDINE GLUCONATE CLOTH 2 % EX PADS
6.0000 | MEDICATED_PAD | Freq: Every day | CUTANEOUS | Status: DC
  Administered 2010-11-25 – 2010-11-28 (×4): 6 via TOPICAL

## 2010-11-25 MED ORDER — PREDNISONE 50 MG PO TABS
60.0000 mg | ORAL_TABLET | Freq: Every day | ORAL | Status: DC
  Administered 2010-11-26 – 2010-11-27 (×2): 60 mg via ORAL
  Filled 2010-11-25 (×4): qty 1

## 2010-11-25 MED ORDER — MUPIROCIN 2 % EX OINT
1.0000 "application " | TOPICAL_OINTMENT | Freq: Two times a day (BID) | CUTANEOUS | Status: DC
  Administered 2010-11-25 – 2010-11-28 (×7): 1 via NASAL
  Filled 2010-11-25 (×2): qty 22

## 2010-11-25 MED ORDER — INSULIN GLARGINE 100 UNIT/ML ~~LOC~~ SOLN
10.0000 [IU] | Freq: Every day | SUBCUTANEOUS | Status: DC
  Administered 2010-11-25: 10 [IU] via SUBCUTANEOUS

## 2010-11-25 NOTE — Progress Notes (Addendum)
Subjective: Interval History: none.  Objective: Vital signs in last 24 hours: Temp:  [95.3 F (35.2 C)-97.7 F (36.5 C)] 97.3 F (36.3 C) (11/20 1002) Pulse Rate:  [66-76] 72  (11/20 1002) Resp:  [18-20] 18  (11/20 1002) BP: (152-185)/(67-82) 159/69 mmHg (11/20 1002) SpO2:  [95 %-99 %] 97 % (11/20 1002) Weight change:   Intake/Output from previous day: 11/19 0701 - 11/20 0700 In: 1560 [P.O.:360; I.V.:1200] Out: 650 [Urine:650] Intake/Output this shift: Total I/O In: 120 [P.O.:120] Out: 175 [Urine:175]  General appearance: alert, cooperative and appears stated age Resp: clear to auscultation bilaterally GI: soft, non-tender; bowel sounds normal; no masses,  no organomegaly Extremities: edema 1+ Neurologic: Grossly normal  Lab Results:  Basename 11/25/10 0630 11/24/10 0620  WBC 5.1 7.7  HGB 7.5* 8.2*  HCT 21.4* 24.1*  PLT 182 194   BMET:  Basename 11/25/10 0630 11/24/10 0620  NA 132* 135  K 3.6 3.8  CL 102 102  CO2 12* 13*  GLUCOSE 264* 179*  BUN 84* 78*  CREATININE 6.24* 6.54*  CALCIUM 7.1* 7.0*  ANA neg C3C4 neg Hepatitis serologies neg  Basename 11/23/10 1225  PTH 344.2*   Iron Studies: No results found for this basename: IRON,TIBC,TRANSFERRIN,FERRITIN in the last 72 hours Studies/Results: No results found.  I have reviewed the patient's current medications.  Assessment/Plan: )Acute Renal failure. Progressive renal failure over last 5 months. Suspected intrinsic renal disease, possible GN vs AIN. Creatinine improving and urine output is in the non-oliguric range. ANA, C3, C4 and hepatitis serologies negative. Positive protein and hypoabuminemia. Will reduce steroids to PO at 60mg  per day. Will stop IVF.  Call interventional radiology about a biopsy. 2) DM 2, on insulin, fairly controlled in spite of corticosteroids.  3) HTN. Clinically euvolemic and hypertensive. Cont IVF's and up titrate norvasc to 10mq QHS.  4) CAD , hx CABG 2010  5) AS, s/p  porcine AVR 2010  6) Anemia, likely secondary to RF. Fe sat 29% which is adequate. Will start ESA.  7) Hypothyroidism  8)GROUP B STREP(S.AGALACTIAE)ISOLATED on rocephin     LOS: 4 days   Ketura Sirek C 11/25/2010,1:52 PM

## 2010-11-25 NOTE — Progress Notes (Signed)
Patient ID: [name], female   DOB: 1920/06/05, 77 y.o.   MRN: [id number]  Subjective: No events overnight. Pt denies chest pain, abdominal pain, shortness of breath. She reports feeling well and has no concerns at this time.   Objective:  Vital signs in last 24 hours:  Filed Vitals:   11/24/10 2200 11/25/10 0021 11/25/10 0609 11/25/10 1002  BP: 176/76 170/75 152/67 159/69  Pulse: 74 75 66 72  Temp: 97.6 F (36.4 C)  95.3 F (35.2 C) 97.3 F (36.3 C)  TempSrc: Oral  Oral Oral  Resp: 19  20 18   Height:      Weight:      SpO2: 99%  99% 97%    Intake/Output from previous day:   Intake/Output Summary (Last 24 hours) at 11/25/10 1224 Last data filed at 11/25/10 1124  Gross per 24 hour  Intake   1440 ml  Output    825 ml  Net    615 ml    Physical Exam: General: Alert, awake, oriented x3, in no acute distress. HEENT: No bruits, no goiter. Heart: Regular rate and rhythm, without murmurs, rubs, gallops. Lungs: Clear to auscultation bilaterally. No wheezing, no rhonchi, no rales Abdomen: Soft, nontender, nondistended, positive bowel sounds. Extremities: No clubbing cyanosis or edema with positive pedal pulses. Neuro: Grossly intact, nonfocal.   Lab Results:  Basic Metabolic Panel:    Component Value Date/Time   NA 132* 11/25/2010 0630   K 3.6 11/25/2010 0630   CL 102 11/25/2010 0630   CO2 12* 11/25/2010 0630   BUN 84* 11/25/2010 0630   CREATININE 6.24* 11/25/2010 0630   GLUCOSE 264* 11/25/2010 0630   GLUCOSE 144* 01/13/2006 0000   CALCIUM 7.1* 11/25/2010 0630   CBC:    Component Value Date/Time   WBC 5.1 11/25/2010 0630   HGB 7.5* 11/25/2010 0630   HCT 21.4* 11/25/2010 0630   PLT 182 11/25/2010 0630   MCV 84.9 11/25/2010 0630   NEUTROABS 7.4 11/21/2010 2044   LYMPHSABS 1.2 11/21/2010 2044   MONOABS 1.2* 11/21/2010 2044   EOSABS 0.4 11/21/2010 2044   BASOSABS 0.1 11/21/2010 2044    Recent Results (from the past 240 hour(s))  URINE CULTURE     Status:  Normal   Collection Time   11/21/10  8:49 PM      Component Value Range Status Comment   Specimen Description URINE, RANDOM   Final    Special Requests NONE   Final    Setup Time [id number]   Final    Colony Count 15,000 COLONIES/ML   Final    Culture     Final    Value: Multiple bacterial morphotypes present, none predominant. Suggest appropriate recollection if clinically indicated.   Report Status 11/23/2010 FINAL   Final   MRSA PCR SCREENING     Status: Abnormal   Collection Time   11/22/10  2:32 AM      Component Value Range Status Comment   MRSA by PCR POSITIVE (*) NEGATIVE  Final   URINE CULTURE     Status: Normal   Collection Time   11/22/10  4:26 AM      Component Value Range Status Comment   Specimen Description URINE, CATHETERIZED   Final    Special Requests NONE   Final    Setup Time [id number]   Final    Colony Count >=100,000 COLONIES/ML   Final    Culture     Final    Value: GROUP B  STREP(S.AGALACTIAE)ISOLATED     Note: TESTING AGAINST S. AGALACTIAE NOT ROUTINELY PERFORMED DUE TO PREDICTABILITY OF AMP/PEN/VAN SUSCEPTIBILITY.   Report Status 11/23/2010 FINAL   Final   CULTURE, BLOOD (ROUTINE X 2)     Status: Normal (Preliminary result)   Collection Time   11/23/10  1:30 AM      Component Value Range Status Comment   Specimen Description BLOOD RIGHT ARM   Final    Special Requests BOTTLES DRAWN AEROBIC AND ANAEROBIC 10CC EA   Final    Setup Time [id number]   Final    Culture     Final    Value:        BLOOD CULTURE RECEIVED NO GROWTH TO DATE CULTURE WILL BE HELD FOR 5 DAYS BEFORE ISSUING A FINAL NEGATIVE REPORT   Report Status PENDING   Incomplete   CULTURE, BLOOD (ROUTINE X 2)     Status: Normal (Preliminary result)   Collection Time   11/23/10  1:33 AM      Component Value Range Status Comment   Specimen Description BLOOD LEFT HAND   Final    Special Requests     Final    Value: BOTTLES DRAWN AEROBIC AND ANAEROBIC 10CC BLUE 5CC RED   Setup Time  [id number]   Final    Culture     Final    Value:        BLOOD CULTURE RECEIVED NO GROWTH TO DATE CULTURE WILL BE HELD FOR 5 DAYS BEFORE ISSUING A FINAL NEGATIVE REPORT   Report Status PENDING   Incomplete     Studies/Results: No results found.  Medications: Scheduled Meds:   . amLODipine  10 mg Oral Daily  . aspirin  81 mg Oral Daily  . cefTRIAXone (ROCEPHIN) IV  1 g Intravenous Q24H  . Chlorhexidine Gluconate Cloth  6 each Topical Q0600  . cloNIDine  0.2 mg Oral TID  . estradiol  0.5 mg Oral Daily  . feeding supplement  237 mL Oral TID BM  . ferrous fumarate  1 tablet Oral Daily  . folic acid  0.5 mg Oral Daily  . hydrALAZINE  10 mg Intravenous TID  . insulin aspart  0-5 Units Subcutaneous QHS  . insulin aspart  0-9 Units Subcutaneous TID WC  . insulin glargine  5 Units Subcutaneous QHS  . levothyroxine  37.5 mcg Oral Custom  . levothyroxine  75 mcg Oral Custom  . methylPREDNISolone (SOLU-MEDROL) injection  125 mg Intravenous Q8H  . metoprolol  75 mg Oral BID  . mupirocin ointment  1 application Nasal BID  . saccharomyces boulardii  250 mg Oral BID  . simvastatin  20 mg Oral QHS  . vitamin C  500 mg Oral Daily   Continuous Infusions:   . sodium chloride 100 mL/hr at 11/25/10 1048   PRN Meds:.acetaminophen, acetaminophen, ALPRAZolam, HYDROcodone-acetaminophen, HYDROmorphone, ondansetron (ZOFRAN) IV, ondansetron, senna-docusate, DISCONTD: hydrALAZINE  Assessment/Plan:  Principal Problem:  *Renal failure - nephrology service following - current work up including complement levels, ANA negative, and renal US suggestive of chronic renal medical disease - patient is maintaining fair amount of urine output - creatinine continues to trend down but unclear whether the response is secondary to solumedrol or IV hydration - will follow up on nephrology recommendations - continue to monitor daily I's and O's  Active Problems:  HYPOTHYROIDISM - continue home dose  synthroid   DIABETES MELLITUS, TYPE II - pt is now on solumedrol so will need to increase Lantus to 10  units QHS and this can be readjusted based on the level of control    HYPERLIPIDEMIA - well controlled, continue statin   ANEMIA - pt's baseline Hg = 8-9, now dropping - will follow up on CBC and transfuse if Hg<7.5 - continue Iron supplementation - follow up on nephrology recommendations   HYPERTENSION - continue Norvasc and Hydralazine   UTI (lower urinary tract infection) - follow up on urine cultures - continue Rocephin   Hypocalcemia - follow up on BMP in AM   Hyperphosphatemia - follow up on renal recommendations   Disposition - ? Need for renal biopsy per nephrology - upon d/c, pt and family prefer to go home with [facility] as recommended per PT - [facility] already arranged    LOS: 4 days   MAGICK-Trixie Maclaren 11/25/2010, 12:24 PM

## 2010-11-26 LAB — CBC
HCT: 22.4 % — ABNORMAL LOW (ref 36.0–46.0)
Hemoglobin: 7.6 g/dL — ABNORMAL LOW (ref 12.0–15.0)
MCHC: 33.9 g/dL (ref 30.0–36.0)
MCV: 85.5 fL (ref 78.0–100.0)
RDW: 13.7 % (ref 11.5–15.5)

## 2010-11-26 LAB — GLUCOSE, CAPILLARY: Glucose-Capillary: 237 mg/dL — ABNORMAL HIGH (ref 70–99)

## 2010-11-26 LAB — PROTEIN ELECTROPHORESIS, SERUM
Albumin ELP: 38.7 % — ABNORMAL LOW (ref 55.8–66.1)
Alpha-2-Globulin: 12.7 % — ABNORMAL HIGH (ref 7.1–11.8)
Beta Globulin: 4.9 % (ref 4.7–7.2)
M-Spike, %: 0.56 g/dL
Total Protein ELP: 6.7 g/dL (ref 6.0–8.3)

## 2010-11-26 LAB — APTT: aPTT: 25 seconds (ref 24–37)

## 2010-11-26 LAB — RENAL FUNCTION PANEL
Albumin: 2.1 g/dL — ABNORMAL LOW (ref 3.5–5.2)
BUN: 92 mg/dL — ABNORMAL HIGH (ref 6–23)
CO2: 11 mEq/L — ABNORMAL LOW (ref 19–32)
Chloride: 102 mEq/L (ref 96–112)
Creatinine, Ser: 6.23 mg/dL — ABNORMAL HIGH (ref 0.50–1.10)
Glucose, Bld: 245 mg/dL — ABNORMAL HIGH (ref 70–99)
Potassium: 3.8 mEq/L (ref 3.5–5.1)

## 2010-11-26 LAB — CALCIUM, IONIZED: Calcium, Ion: 0.97 mmol/L — ABNORMAL LOW (ref 1.12–1.32)

## 2010-11-26 LAB — PROTIME-INR: INR: 1.15 (ref 0.00–1.49)

## 2010-11-26 LAB — GLOMERULAR BASEMENT MEMBRANE ANTIBODIES

## 2010-11-26 MED ORDER — GLUCERNA SHAKE PO LIQD: 237.0000 mL | Freq: Every day | ORAL | Status: DC

## 2010-11-26 MED ORDER — MAGNESIUM HYDROXIDE 400 MG/5ML PO SUSP: 30.0000 mL | Freq: Every evening | ORAL | Status: DC | PRN

## 2010-11-26 MED ORDER — ASPIRIN 81 MG PO CHEW: 81.0000 mg | CHEWABLE_TABLET | Freq: Every day | ORAL | Status: DC

## 2010-11-26 MED ORDER — BISACODYL 10 MG RE SUPP
10.0000 mg | Freq: Every day | RECTAL | Status: DC | PRN
  Administered 2010-11-27: 10 mg via RECTAL
  Filled 2010-11-26: qty 1

## 2010-11-26 MED ORDER — ALUMINUM HYDROXIDE GEL 320 MG/5ML PO SUSP
30.0000 mL | Freq: Three times a day (TID) | ORAL | Status: DC
  Administered 2010-11-26 – 2010-11-28 (×8): 30 mL via ORAL
  Filled 2010-11-26 (×9): qty 30

## 2010-11-26 MED ORDER — BISACODYL 5 MG PO TBEC: 5.0000 mg | DELAYED_RELEASE_TABLET | Freq: Every day | ORAL | Status: DC | PRN

## 2010-11-26 MED ORDER — MAGNESIUM HYDROXIDE 400 MG/5ML PO SUSP
30.0000 mL | Freq: Once | ORAL | Status: AC
  Administered 2010-11-26: 30 mL via ORAL
  Filled 2010-11-26: qty 30

## 2010-11-26 MED ORDER — SODIUM BICARBONATE 650 MG PO TABS
1300.0000 mg | ORAL_TABLET | Freq: Three times a day (TID) | ORAL | Status: DC
  Administered 2010-11-26 – 2010-11-28 (×7): 1300 mg via ORAL
  Filled 2010-11-26 (×9): qty 2

## 2010-11-26 MED ORDER — INSULIN GLARGINE 100 UNIT/ML ~~LOC~~ SOLN
10.0000 [IU] | Freq: Two times a day (BID) | SUBCUTANEOUS | Status: DC
  Administered 2010-11-26 – 2010-11-28 (×5): 10 [IU] via SUBCUTANEOUS
  Filled 2010-11-26: qty 3

## 2010-11-26 NOTE — Progress Notes (Signed)
Nutrition Follow-Up  Continues with work-up of ARF. Possible renal biopsy soon. Eating 50-75% of diet. Family reports biggest barrier to eating is patient's dislike of some of the hospital food.  Diet: CHO Modified Medium Supplements: Glucerna Shake TID  Wt: 56.1 kg (11/20) (up 8.1 kg x 4 days)  Medications:    . aluminum hydroxide  30 mL Oral TID  . amLODipine  10 mg Oral Daily  . aspirin  81 mg Oral Daily  . cefTRIAXone (ROCEPHIN) IV  1 g Intravenous Q24H  . Chlorhexidine Gluconate Cloth  6 each Topical Q0600  . cloNIDine  0.2 mg Oral TID  . estradiol  0.5 mg Oral Daily  . feeding supplement  237 mL Oral TID BM  . ferrous fumarate  1 tablet Oral Daily  . folic acid  0.5 mg Oral Daily  . hydrALAZINE  10 mg Intravenous TID  . insulin aspart  0-5 Units Subcutaneous QHS  . insulin aspart  0-9 Units Subcutaneous TID WC  . insulin glargine  10 Units Subcutaneous QHS  . levothyroxine  37.5 mcg Oral Custom  . levothyroxine  75 mcg Oral Custom  . metoprolol  75 mg Oral BID  . mupirocin ointment  1 application Nasal BID  . predniSONE  60 mg Oral QAC breakfast  . saccharomyces boulardii  250 mg Oral BID  . simvastatin  20 mg Oral QHS  . sodium bicarbonate  1,300 mg Oral TID  . vitamin C  500 mg Oral Daily  . DISCONTD: insulin glargine  5 Units Subcutaneous QHS  . DISCONTD: methylPREDNISolone (SOLU-MEDROL) injection  125 mg Intravenous Q8H   BMET    Component Value Date/Time   NA 132* 11/26/2010 0650   K 3.8 11/26/2010 0650   CL 102 11/26/2010 0650   CO2 11* 11/26/2010 0650   GLUCOSE 245* 11/26/2010 0650   GLUCOSE 144* 01/13/2006 0000   BUN 92* 11/26/2010 0650   CREATININE 6.23* 11/26/2010 0650   CALCIUM 7.1* 11/26/2010 0650   GFRNONAA 5* 11/26/2010 0650   GFRAA 6* 11/26/2010 0650    Nutrition dx: Inadequate oral intake - persists.  Goal: Adequate oral intake to meet 90-100% of nutrition needs to prevent further weight loss.  Monitor: PO intake, labs  Plan: 1. RD to  add yogurt daily between meals, per pt's request 2. Discussed use of alternative selections portion of menu to encourage additional intake 3. Decrease Glucerna Shakes daily, pt will only drink at most 1 can a day  #[id number]

## 2010-11-26 NOTE — Progress Notes (Signed)
Patient ID: TAKEIRA YANES, female   DOB: January 03, 1921, 77 y.o.   MRN: [id number]  S: Reports to be feeling better and only bothered by constipation. Denies any shortness of breath.  O:BP 163/70  Pulse 65  Temp(Src) 97.3 F (36.3 C) (Oral)  Resp 17  Ht 5\' 3"  (1.6 m)  Wt 56.1 kg (123 lb 10.9 oz)  BMI 21.91 kg/m2  SpO2 98%  Intake/Output Summary (Last 24 hours) at 11/26/10 0828 Last data filed at 11/26/10 0557  Gross per 24 hour  Intake    680 ml  Output    575 ml  Net    105 ml   Weight change:  YQM:VHQIONGEXBM resting in bed and able to speak complete sentences WUX:LKGMW RRR, S1 and S2 with ESM Resp:Coarse BS bilaterally with decreased BS Left base NUU:VOZD, obese, NT, BS normal Ext:1+ bipedal edema      . amLODipine  10 mg Oral Daily  . aspirin  81 mg Oral Daily  . cefTRIAXone (ROCEPHIN) IV  1 g Intravenous Q24H  . Chlorhexidine Gluconate Cloth  6 each Topical Q0600  . cloNIDine  0.2 mg Oral TID  . estradiol  0.5 mg Oral Daily  . feeding supplement  237 mL Oral TID BM  . ferrous fumarate  1 tablet Oral Daily  . folic acid  0.5 mg Oral Daily  . hydrALAZINE  10 mg Intravenous TID  . insulin aspart  0-5 Units Subcutaneous QHS  . insulin aspart  0-9 Units Subcutaneous TID WC  . insulin glargine  10 Units Subcutaneous QHS  . levothyroxine  37.5 mcg Oral Custom  . levothyroxine  75 mcg Oral Custom  . metoprolol  75 mg Oral BID  . mupirocin ointment  1 application Nasal BID  . predniSONE  60 mg Oral QAC breakfast  . saccharomyces boulardii  250 mg Oral BID  . simvastatin  20 mg Oral QHS  . vitamin C  500 mg Oral Daily  . DISCONTD: insulin glargine  5 Units Subcutaneous QHS  . DISCONTD: methylPREDNISolone (SOLU-MEDROL) injection  125 mg Intravenous Q8H   No results found. BMET  Lab 11/26/10 0650 11/25/10 0630 11/24/10 0620 11/23/10 0500 11/22/10 0335 11/22/10 0334 11/21/10 2044  NA 132* 132* 135 135 133* -- 131*  K 3.8 3.6 3.8 3.5 4.1 -- 4.0  CL 102 102 102 101 98 --  92*  CO2 11* 12* 13* 15* 17* -- 17*  GLUCOSE 245* 264* 179* 58* 200* -- 169*  BUN 92* 84* 78* 82* 92* -- 96*  CREATININE 6.23* 6.24* 6.54* 7.05* 7.83* 7.78* 7.70*  ALB -- -- -- -- -- -- --  CALCIUM 7.1* 7.1* 7.0* 7.2* 7.4* -- 8.2*  PHOS 8.3* 7.9* 6.7* -- -- -- --   CBC  Lab 11/26/10 0650 11/25/10 0630 11/24/10 0620 11/23/10 0500 11/21/10 2044  WBC 10.4 5.1 7.7 10.5 --  NEUTROABS -- -- -- -- 7.4  HGB 7.6* 7.5* 8.2* 8.9* --  HCT 22.4* 21.4* 24.1* 25.9* --  MCV 85.5 84.9 84.9 85.8 --  PLT 210 182 194 214 --     Assessment/Plan:  1) Acute Renal failure. Progressive renal failure over last 5 months. Suspected intrinsic renal disease, possible GN vs AIN. Creatinine improving and urine output charted is barely in the non-oliguric range. Multiple serologies negative and urine studied showed urine P/C ratio of 4 with hypoalbuminemia raising further concerns for GN. On PO Prednisone at 60mg  per day. Call interventional radiology about renal biopsy today as the  etiology of her ARF remain unexplained by labs/history. Start oral sodium bicarbonate for metabolic acidosis. BUN rising out of proportion to creatinine due to corticosteroids.  2) DM 2, on insulin, fairly controlled in spite of corticosteroids.  3) HTN. Clinically euvolemic and hypertensive. BP slightly elevated off IVFs, monitor.  4) CAD , hx CABG 2010  5) AS, s/p porcine AVR 2010  6) Anemia, likely secondary to RF. Fe sat 29% which is adequate. Will start ESA.  7) Hyperphosphatemia: Due to AKI. 8)GROUP B STREP(S.AGALACTIAE)ISOLATED on rocephin     Ashlen Kiger K.

## 2010-11-26 NOTE — Interval H&P Note (Cosign Needed)
History and Physical Interval Note:   11/26/2010   2:26 PM   [name]  Is scheduled for US guided random renal biopsy on 11/28/10.  The various methods of treatment have been discussed with the patient and family. After consideration of risks, benefits and other options for treatment, the patient has consented to the above procedure .  The patients' history has been reviewed, patient examined, no change in status, stable for renal biopsy.  I have reviewed the patients' chart and labs.  Questions were answered to the patient's satisfaction.     Chinita Pester  PA-C

## 2010-11-26 NOTE — Progress Notes (Signed)
[name] CSN:[id number],MRN:[id number] is a 77 y.o. female,  Outpatient Primary MD for the patient is Richard Letvak, MD, MD Chief Complaint  Patient presents with  . Urinary Tract Infection      11/26/2010   Subjective:   [name] today has, No headache, No chest pain, No abdominal pain - No Nausea, No new weakness tingling or numbness, No Cough - SOB.   Objective:   Filed Vitals:   11/25/10 1737 11/25/10 2217 11/26/10 0556 11/26/10 1041  BP: 156/66 138/67 163/70 163/70  Pulse: 72 69 65 69  Temp: 97.6 F (36.4 C) 97.6 F (36.4 C) 97.3 F (36.3 C) 97.6 F (36.4 C)  TempSrc: Oral Oral Oral Oral  Resp: 20 19 17 20  Height:      Weight:  56.1 kg (123 lb 10.9 oz)    SpO2: 99% 99% 98% 100%   Wt Readings from Last 3 Encounters:  11/25/10 56.1 kg (123 lb 10.9 oz)  09/29/10 51.03 kg (112 lb 8 oz)  08/18/10 53.071 kg (117 lb)    Intake/Output Summary (Last 24 hours) at 11/26/10 1154 Last data filed at 11/26/10 0900  Gross per 24 hour  Intake    800 ml  Output    400 ml  Net    400 ml   Exam Awake Alert, Oriented *3, No new F.N deficits, Normal affect [place].AT,PERRAL Supple Neck,No JVD, No cervical lymphadenopathy appriciated.  Symmetrical Chest wall movement, Good air movement bilaterally, CTAB RRR,No Gallops,Rubs or new Murmurs, No Parasternal Heave +ve B.Sounds, Abd Soft, Non tender, No organomegaly appriciated, No rebound -guarding or rigidity. No Cyanosis, Clubbing or edema, No new Rash or bruise    Data Review  CBC  Lab 11/26/10 0650 11/25/10 0630 11/24/10 0620 11/23/10 0500 11/22/10 1640 11/21/10 2044  WBC 10.4 5.1 7.7 10.5 11.6* --  HGB 7.6* 7.5* 8.2* 8.9* 8.1* --  HCT 22.4* 21.4* 24.1* 25.9* 23.9* --  PLT 210 182 194 214 187 --  MCV 85.5 84.9 84.9 85.8 86.6 --  MCH 29.0 29.8 28.9 29.5 29.3 --  MCHC 33.9 35.0 34.0 34.4 33.9 --  RDW 13.7 13.5 13.5 13.4 13.3 --  LYMPHSABS -- -- -- -- -- 1.2  MONOABS -- -- -- -- -- 1.2*  EOSABS -- -- -- -- -- 0.4  BASOSABS --  -- -- -- -- 0.1  BANDABS -- -- -- -- -- --    Lab 11/26/10 0650 11/25/10 0630 11/24/10 0620 11/23/10 0500 11/22/10 0335  NA 132* 132* 135 135 133*  K 3.8 3.6 3.8 3.5 4.1  CL 102 102 102 101 98  CO2 11* 12* 13* 15* 17*  GLUCOSE 245* 264* 179* 58* 200*  BUN 92* 84* 78* 82* 92*  CREATININE 6.23* 6.24* 6.54* 7.05* 7.83*  CALCIUM 7.1* 7.1* 7.0* 7.2* 7.4*  MG -- -- -- -- --   No results found for this basename: INR:5,PROTIME:5 in the last 168 hours Cardiac markers: No results found for this basename: CK:3,CKMB:3,TROPONINI:3,MYOGLOBIN:3 in the last 168 hours No results found for this basename: POCBNP:3 in the last 168 hours Recent Results (from the past 240 hour(s))  URINE CULTURE     Status: Normal   Collection Time   11/21/10  8:49 PM      Component Value Range Status Comment   Specimen Description URINE, RANDOM   Final    Special Requests NONE   Final    Setup Time [id number]   Final    Colony Count 15,000 COLONIES/ML     Final    Culture     Final    Value: Multiple bacterial morphotypes present, none predominant. Suggest appropriate recollection if clinically indicated.   Report Status 11/23/2010 FINAL   Final   MRSA PCR SCREENING     Status: Abnormal   Collection Time   11/22/10  2:32 AM      Component Value Range Status Comment   MRSA by PCR POSITIVE (*) NEGATIVE  Final   URINE CULTURE     Status: Normal   Collection Time   11/22/10  4:26 AM      Component Value Range Status Comment   Specimen Description URINE, CATHETERIZED   Final    Special Requests NONE   Final    Setup Time [id number]   Final    Colony Count >=100,000 COLONIES/ML   Final    Culture     Final    Value: GROUP B STREP(S.AGALACTIAE)ISOLATED     Note: TESTING AGAINST S. AGALACTIAE NOT ROUTINELY PERFORMED DUE TO PREDICTABILITY OF AMP/PEN/VAN SUSCEPTIBILITY.   Report Status 11/23/2010 FINAL   Final   CULTURE, BLOOD (ROUTINE X 2)     Status: Normal (Preliminary result)   Collection Time   11/23/10   1:30 AM      Component Value Range Status Comment   Specimen Description BLOOD RIGHT ARM   Final    Special Requests BOTTLES DRAWN AEROBIC AND ANAEROBIC 10CC EA   Final    Setup Time [id number]   Final    Culture     Final    Value:        BLOOD CULTURE RECEIVED NO GROWTH TO DATE CULTURE WILL BE HELD FOR 5 DAYS BEFORE ISSUING A FINAL NEGATIVE REPORT   Report Status PENDING   Incomplete   CULTURE, BLOOD (ROUTINE X 2)     Status: Normal (Preliminary result)   Collection Time   11/23/10  1:33 AM      Component Value Range Status Comment   Specimen Description BLOOD LEFT HAND   Final    Special Requests     Final    Value: BOTTLES DRAWN AEROBIC AND ANAEROBIC 10CC BLUE 5CC RED   Setup Time [id number]   Final    Culture     Final    Value:        BLOOD CULTURE RECEIVED NO GROWTH TO DATE CULTURE WILL BE HELD FOR 5 DAYS BEFORE ISSUING A FINAL NEGATIVE REPORT   Report Status PENDING   Incomplete     Radiology Reports Dg Chest 2 View  11/21/2010  *RADIOLOGY REPORT*  Clinical Data: Prior aortic valve replacement, abnormal labs  CHEST - 2 VIEW  Comparison: 07/18/2010  Findings: Chronic interstitial markings. No pleural effusion or pneumothorax.  The heart is top normal in size. Postsurgical changes related to prior CABG.  Aortic valve replacement.  Mild degenerative changes with curvature of the visualized thoracolumbar spine.  IMPRESSION: No evidence of acute cardiopulmonary disease.  Original Report Authenticated By: SRIYESH KRISHNAN, M.D.   Us Renal  11/22/2010  *RADIOLOGY REPORT*  Clinical Data: Acute on chronic renal failure, evaluate for obstruction  RENAL/URINARY TRACT ULTRASOUND COMPLETE  Comparison:  CT abdomen/pelvis dated 06/09/2010  Findings:  Right Kidney:  Measures 10.2 cm.  No mass or hydronephrosis. Increased parenchymal echogenicity, suggesting medical renal disease.  Left Kidney:  Measures 10.5 cm.  No mass or hydronephrosis. Increased parenchymal density, suggesting medical  renal disease.  Bladder:  Decompressed by indwelling Foley catheter.  Additional comments:    Small right pleural effusion.  IMPRESSION: Increased parenchymal echogenicity, suggesting medical renal disease.  No hydronephrosis.  Small right pleural effusion.  Original Report Authenticated By: SRIYESH KRISHNAN, M.D.   Scheduled Meds:   . aluminum hydroxide  30 mL Oral TID  . amLODipine  10 mg Oral Daily  . aspirin  81 mg Oral Daily  . cefTRIAXone (ROCEPHIN) IV  1 g Intravenous Q24H  . Chlorhexidine Gluconate Cloth  6 each Topical Q0600  . cloNIDine  0.2 mg Oral TID  . estradiol  0.5 mg Oral Daily  . feeding supplement  237 mL Oral TID BM  . ferrous fumarate  1 tablet Oral Daily  . folic acid  0.5 mg Oral Daily  . hydrALAZINE  10 mg Intravenous TID  . insulin aspart  0-5 Units Subcutaneous QHS  . insulin aspart  0-9 Units Subcutaneous TID WC  . insulin glargine  10 Units Subcutaneous QHS  . levothyroxine  37.5 mcg Oral Custom  . levothyroxine  75 mcg Oral Custom  . metoprolol  75 mg Oral BID  . mupirocin ointment  1 application Nasal BID  . predniSONE  60 mg Oral QAC breakfast  . saccharomyces boulardii  250 mg Oral BID  . simvastatin  20 mg Oral QHS  . sodium bicarbonate  1,300 mg Oral TID  . vitamin C  500 mg Oral Daily  . DISCONTD: insulin glargine  5 Units Subcutaneous QHS  . DISCONTD: methylPREDNISolone (SOLU-MEDROL) injection  125 mg Intravenous Q8H   Continuous Infusions:   . DISCONTD: sodium chloride 100 mL/hr at 11/25/10 1048   PRN Meds:.acetaminophen, acetaminophen, ALPRAZolam, HYDROcodone-acetaminophen, HYDROmorphone, ondansetron (ZOFRAN) IV, ondansetron, senna-docusate  Assessment & Plan   Renal failure  - nephrology service following  - current work up including complement levels, ANA negative, and renal US suggestive of chronic renal medical disease  - patient is maintaining fair amount of urine output  - creatinine continues to trend down but unclear whether the  response is secondary to solumedrol or IV hydration  - will follow up on nephrology recommendations , Biopsy 11-26-10, ASA held today. - continue to monitor daily I's and O's    HYPOTHYROIDISM  - continue home dose synthroid   DIABETES MELLITUS, TYPE II  - pt is now on solumedrol so will need to increase Lantus to 10 units BID, high sugars steroid related. and this can be readjusted based on the level of control  CBG (last 3)   Basename 11/26/10 0749 11/25/10 2215 11/25/10 1624  GLUCAP 250* 291* 337*     HYPERLIPIDEMIA  - well controlled, continue statin   ANEMIA  - pt's baseline Hg = 8-9, now dropping  - will follow up on CBC and transfuse if Hg<7.5  - continue Iron supplementation  - follow up on nephrology recommendations , ESA started/  HYPERTENSION  - continue Norvasc and Hydralazine   UTI (lower urinary tract infection)  - follow up on urine cultures  - continue Rocephin   Hypocalcemia & Hyperphosphatemia  - follow up on renal recommendations , check ion Calcium   DVT Prophylaxis   SCDs  See all Orders from today for further details      

## 2010-11-26 NOTE — H&P (View-Only) (Signed)
[name] [id number] is a 77 y.o. female,  Outpatient Primary MD for the patient is Tillman Abide, MD, MD Chief Complaint  Patient presents with  . Urinary Tract Infection      11/26/2010   Subjective:   [name] today has, No headache, No chest pain, No abdominal pain - No Nausea, No new weakness tingling or numbness, No Cough - SOB.   Objective:   Filed Vitals:   11/25/10 1737 11/25/10 2217 11/26/10 0556 11/26/10 1041  BP: 156/66 138/67 163/70 163/70  Pulse: 72 69 65 69  Temp: 97.6 F (36.4 C) 97.6 F (36.4 C) 97.3 F (36.3 C) 97.6 F (36.4 C)  TempSrc: Oral Oral Oral Oral  Resp: 20 19 17 20   Height:      Weight:  56.1 kg (123 lb 10.9 oz)    SpO2: 99% 99% 98% 100%   Wt Readings from Last 3 Encounters:  11/25/10 56.1 kg (123 lb 10.9 oz)  09/29/10 51.03 kg (112 lb 8 oz)  08/18/10 53.071 kg (117 lb)    Intake/Output Summary (Last 24 hours) at 11/26/10 1154 Last data filed at 11/26/10 0900  Gross per 24 hour  Intake    800 ml  Output    400 ml  Net    400 ml   Exam Awake Alert, Oriented *3, No new F.N deficits, Normal affect [place].AT,PERRAL Supple Neck,No JVD, No cervical lymphadenopathy appriciated.  Symmetrical Chest wall movement, Good air movement bilaterally, CTAB RRR,No Gallops,Rubs or new Murmurs, No Parasternal Heave +ve B.Sounds, Abd Soft, Non tender, No organomegaly appriciated, No rebound -guarding or rigidity. No Cyanosis, Clubbing or edema, No new Rash or bruise    Data Review  CBC  Lab 11/26/10 0650 11/25/10 0630 11/24/10 0620 11/23/10 0500 11/22/10 1640 11/21/10 2044  WBC 10.4 5.1 7.7 10.5 11.6* --  HGB 7.6* 7.5* 8.2* 8.9* 8.1* --  HCT 22.4* 21.4* 24.1* 25.9* 23.9* --  PLT 210 182 194 214 187 --  MCV 85.5 84.9 84.9 85.8 86.6 --  MCH 29.0 29.8 28.9 29.5 29.3 --  MCHC 33.9 35.0 34.0 34.4 33.9 --  RDW 13.7 13.5 13.5 13.4 13.3 --  LYMPHSABS -- -- -- -- -- 1.2  MONOABS -- -- -- -- -- 1.2*  EOSABS -- -- -- -- -- 0.4  BASOSABS --  -- -- -- -- 0.1  BANDABS -- -- -- -- -- --    Lab 11/26/10 0650 11/25/10 0630 11/24/10 0620 11/23/10 0500 11/22/10 0335  NA 132* 132* 135 135 133*  K 3.8 3.6 3.8 3.5 4.1  CL 102 102 102 101 98  CO2 11* 12* 13* 15* 17*  GLUCOSE 245* 264* 179* 58* 200*  BUN 92* 84* 78* 82* 92*  CREATININE 6.23* 6.24* 6.54* 7.05* 7.83*  CALCIUM 7.1* 7.1* 7.0* 7.2* 7.4*  MG -- -- -- -- --   No results found for this basename: INR:5,PROTIME:5 in the last 168 hours Cardiac markers: No results found for this basename: CK:3,CKMB:3,TROPONINI:3,MYOGLOBIN:3 in the last 168 hours No results found for this basename: POCBNP:3 in the last 168 hours Recent Results (from the past 240 hour(s))  URINE CULTURE     Status: Normal   Collection Time   11/21/10  8:49 PM      Component Value Range Status Comment   Specimen Description URINE, RANDOM   Final    Special Requests NONE   Final    Setup Time [id number]   Final    Colony Count 15,000 COLONIES/ML  Final    Culture     Final    Value: Multiple bacterial morphotypes present, none predominant. Suggest appropriate recollection if clinically indicated.   Report Status 11/23/2010 FINAL   Final   MRSA PCR SCREENING     Status: Abnormal   Collection Time   11/22/10  2:32 AM      Component Value Range Status Comment   MRSA by PCR POSITIVE (*) NEGATIVE  Final   URINE CULTURE     Status: Normal   Collection Time   11/22/10  4:26 AM      Component Value Range Status Comment   Specimen Description URINE, CATHETERIZED   Final    Special Requests NONE   Final    Setup Time [id number]   Final    Colony Count >=100,000 COLONIES/ML   Final    Culture     Final    Value: GROUP B STREP(S.AGALACTIAE)ISOLATED     Note: TESTING AGAINST S. AGALACTIAE NOT ROUTINELY PERFORMED DUE TO PREDICTABILITY OF AMP/PEN/VAN SUSCEPTIBILITY.   Report Status 11/23/2010 FINAL   Final   CULTURE, BLOOD (ROUTINE X 2)     Status: Normal (Preliminary result)   Collection Time   11/23/10   1:30 AM      Component Value Range Status Comment   Specimen Description BLOOD RIGHT ARM   Final    Special Requests BOTTLES DRAWN AEROBIC AND ANAEROBIC 10CC EA   Final    Setup Time [id number]   Final    Culture     Final    Value:        BLOOD CULTURE RECEIVED NO GROWTH TO DATE CULTURE WILL BE HELD FOR 5 DAYS BEFORE ISSUING A FINAL NEGATIVE REPORT   Report Status PENDING   Incomplete   CULTURE, BLOOD (ROUTINE X 2)     Status: Normal (Preliminary result)   Collection Time   11/23/10  1:33 AM      Component Value Range Status Comment   Specimen Description BLOOD LEFT HAND   Final    Special Requests     Final    Value: BOTTLES DRAWN AEROBIC AND ANAEROBIC 10CC BLUE 5CC RED   Setup Time [id number]   Final    Culture     Final    Value:        BLOOD CULTURE RECEIVED NO GROWTH TO DATE CULTURE WILL BE HELD FOR 5 DAYS BEFORE ISSUING A FINAL NEGATIVE REPORT   Report Status PENDING   Incomplete     Radiology Reports Dg Chest 2 View  11/21/2010  *RADIOLOGY REPORT*  Clinical Data: Prior aortic valve replacement, abnormal labs  CHEST - 2 VIEW  Comparison: 07/18/2010  Findings: Chronic interstitial markings. No pleural effusion or pneumothorax.  The heart is top normal in size. Postsurgical changes related to prior CABG.  Aortic valve replacement.  Mild degenerative changes with curvature of the visualized thoracolumbar spine.  IMPRESSION: No evidence of acute cardiopulmonary disease.  Original Report Authenticated By: Charline Bills, M.D.   US Renal  11/22/2010  *RADIOLOGY REPORT*  Clinical Data: Acute on chronic renal failure, evaluate for obstruction  RENAL/URINARY TRACT ULTRASOUND COMPLETE  Comparison:  CT abdomen/pelvis dated 06/09/2010  Findings:  Right Kidney:  Measures 10.2 cm.  No mass or hydronephrosis. Increased parenchymal echogenicity, suggesting medical renal disease.  Left Kidney:  Measures 10.5 cm.  No mass or hydronephrosis. Increased parenchymal density, suggesting medical  renal disease.  Bladder:  Decompressed by indwelling Foley catheter.  Additional comments:  Small right pleural effusion.  IMPRESSION: Increased parenchymal echogenicity, suggesting medical renal disease.  No hydronephrosis.  Small right pleural effusion.  Original Report Authenticated By: Charline Bills, M.D.   Scheduled Meds:   . aluminum hydroxide  30 mL Oral TID  . amLODipine  10 mg Oral Daily  . aspirin  81 mg Oral Daily  . cefTRIAXone (ROCEPHIN) IV  1 g Intravenous Q24H  . Chlorhexidine Gluconate Cloth  6 each Topical Q0600  . cloNIDine  0.2 mg Oral TID  . estradiol  0.5 mg Oral Daily  . feeding supplement  237 mL Oral TID BM  . ferrous fumarate  1 tablet Oral Daily  . folic acid  0.5 mg Oral Daily  . hydrALAZINE  10 mg Intravenous TID  . insulin aspart  0-5 Units Subcutaneous QHS  . insulin aspart  0-9 Units Subcutaneous TID WC  . insulin glargine  10 Units Subcutaneous QHS  . levothyroxine  37.5 mcg Oral Custom  . levothyroxine  75 mcg Oral Custom  . metoprolol  75 mg Oral BID  . mupirocin ointment  1 application Nasal BID  . predniSONE  60 mg Oral QAC breakfast  . saccharomyces boulardii  250 mg Oral BID  . simvastatin  20 mg Oral QHS  . sodium bicarbonate  1,300 mg Oral TID  . vitamin C  500 mg Oral Daily  . DISCONTD: insulin glargine  5 Units Subcutaneous QHS  . DISCONTD: methylPREDNISolone (SOLU-MEDROL) injection  125 mg Intravenous Q8H   Continuous Infusions:   . DISCONTD: sodium chloride 100 mL/hr at 11/25/10 1048   PRN Meds:.acetaminophen, acetaminophen, ALPRAZolam, HYDROcodone-acetaminophen, HYDROmorphone, ondansetron (ZOFRAN) IV, ondansetron, senna-docusate  Assessment & Plan   Renal failure  - nephrology service following  - current work up including complement levels, ANA negative, and renal US suggestive of chronic renal medical disease  - patient is maintaining fair amount of urine output  - creatinine continues to trend down but unclear whether the  response is secondary to solumedrol or IV hydration  - will follow up on nephrology recommendations , Biopsy 11-26-10, ASA held today. - continue to monitor daily I's and O's    HYPOTHYROIDISM  - continue home dose synthroid   DIABETES MELLITUS, TYPE II  - pt is now on solumedrol so will need to increase Lantus to 10 units BID, high sugars steroid related. and this can be readjusted based on the level of control  CBG (last 3)   Basename 11/26/10 0749 11/25/10 2215 11/25/10 1624  GLUCAP 250* 291* 337*     HYPERLIPIDEMIA  - well controlled, continue statin   ANEMIA  - pt's baseline Hg = 8-9, now dropping  - will follow up on CBC and transfuse if Hg<7.5  - continue Iron supplementation  - follow up on nephrology recommendations , ESA started/  HYPERTENSION  - continue Norvasc and Hydralazine   UTI (lower urinary tract infection)  - follow up on urine cultures  - continue Rocephin   Hypocalcemia & Hyperphosphatemia  - follow up on renal recommendations , check ion Calcium   DVT Prophylaxis   SCDs  See all Orders from today for further details

## 2010-11-27 ENCOUNTER — Inpatient Hospital Stay (HOSPITAL_COMMUNITY): Payer: Medicare Other

## 2010-11-27 LAB — URINALYSIS, ROUTINE W REFLEX MICROSCOPIC
Glucose, UA: 500 mg/dL — AB
Specific Gravity, Urine: 1.015 (ref 1.005–1.030)
Urobilinogen, UA: 0.2 mg/dL (ref 0.0–1.0)
pH: 5 (ref 5.0–8.0)

## 2010-11-27 LAB — RENAL FUNCTION PANEL
Albumin: 2.1 g/dL — ABNORMAL LOW (ref 3.5–5.2)
Calcium: 7.2 mg/dL — ABNORMAL LOW (ref 8.4–10.5)
GFR calc Af Amer: 6 mL/min — ABNORMAL LOW (ref >90)
GFR calc non Af Amer: 5 mL/min — ABNORMAL LOW (ref >90)
Phosphorus: 7.9 mg/dL — ABNORMAL HIGH (ref 2.3–4.6)
Sodium: 132 mEq/L — ABNORMAL LOW (ref 135–145)

## 2010-11-27 LAB — URINE MICROSCOPIC-ADD ON

## 2010-11-27 LAB — PREPARE RBC (CROSSMATCH)

## 2010-11-27 LAB — CBC
MCV: 85.4 fL (ref 78.0–100.0)
Platelets: 214 10*3/uL (ref 150–400)
RBC: 2.67 MIL/uL — ABNORMAL LOW (ref 3.87–5.11)
WBC: 11.1 10*3/uL — ABNORMAL HIGH (ref 4.0–10.5)

## 2010-11-27 LAB — GLUCOSE, CAPILLARY: Glucose-Capillary: 315 mg/dL — ABNORMAL HIGH (ref 70–99)

## 2010-11-27 MED ORDER — DIPHENHYDRAMINE HCL 50 MG/ML IJ SOLN: 25.0000 mg | Freq: Four times a day (QID) | INTRAMUSCULAR | Status: DC | PRN

## 2010-11-27 MED ORDER — FUROSEMIDE 10 MG/ML IJ SOLN
20.0000 mg | INTRAMUSCULAR | Status: AC
  Administered 2010-11-27 – 2010-11-28 (×2): 20 mg via INTRAVENOUS
  Filled 2010-11-27: qty 2

## 2010-11-27 MED ORDER — PREDNISONE 20 MG PO TABS
40.0000 mg | ORAL_TABLET | Freq: Every day | ORAL | Status: DC
  Administered 2010-11-28: 40 mg via ORAL
  Filled 2010-11-27 (×2): qty 2

## 2010-11-27 MED ORDER — FUROSEMIDE 10 MG/ML IJ SOLN: 20.0000 mg | INTRAMUSCULAR | Status: DC

## 2010-11-27 MED ORDER — FUROSEMIDE 10 MG/ML IJ SOLN
INTRAMUSCULAR | Status: AC
  Administered 2010-11-27: 20 mg
  Filled 2010-11-27: qty 4

## 2010-11-27 MED ORDER — SODIUM CHLORIDE 0.9 % IV SOLN
1.0000 g | Freq: Once | INTRAVENOUS | Status: AC
  Administered 2010-11-28: 1 g via INTRAVENOUS
  Filled 2010-11-27: qty 10

## 2010-11-27 MED ORDER — DIPHENHYDRAMINE HCL 50 MG/ML IJ SOLN: 12.5000 mg | Freq: Four times a day (QID) | INTRAMUSCULAR | Status: DC | PRN

## 2010-11-27 NOTE — Progress Notes (Addendum)
Patient ID: ZEYNA MKRTCHYAN, female   DOB: 1920/05/24, 77 y.o.   MRN: [id number]  S:Reports to be feeling well and denies CP or SOB. Nausea resolved.  O:BP 157/72  Pulse 66  Temp(Src) 97.7 F (36.5 C) (Oral)  Resp 19  Ht 5\' 3"  (1.6 m)  Wt 58.2 kg (128 lb 4.9 oz)  BMI 22.73 kg/m2  SpO2 97%  Intake/Output Summary (Last 24 hours) at 11/27/10 1146 Last data filed at 11/27/10 0700  Gross per 24 hour  Intake    110 ml  Output    500 ml  Net   -390 ml   Weight change: 2.1 kg (4 lb 10.1 oz)  CHY:IFOYDXAJ to time, person and place. Comfortably sitting up in recliner OIN:OMVEH RRR, S1 and S2 with ESM Resp:Decreased BS bibasally, no rales/rhonchi MCN:OBSJ, flat, NT, BS normal Ext:No LE edema      . aluminum hydroxide  30 mL Oral TID  . amLODipine  10 mg Oral Daily  . aspirin  81 mg Oral Daily  . cefTRIAXone (ROCEPHIN) IV  1 g Intravenous Q24H  . Chlorhexidine Gluconate Cloth  6 each Topical Q0600  . cloNIDine  0.2 mg Oral TID  . estradiol  0.5 mg Oral Daily  . feeding supplement  237 mL Oral Daily  . ferrous fumarate  1 tablet Oral Daily  . folic acid  0.5 mg Oral Daily  . hydrALAZINE  10 mg Intravenous TID  . insulin aspart  0-5 Units Subcutaneous QHS  . insulin aspart  0-9 Units Subcutaneous TID WC  . insulin glargine  10 Units Subcutaneous BID  . levothyroxine  37.5 mcg Oral Custom  . levothyroxine  75 mcg Oral Custom  . magnesium hydroxide  30 mL Oral Once  . metoprolol  75 mg Oral BID  . mupirocin ointment  1 application Nasal BID  . predniSONE  60 mg Oral QAC breakfast  . saccharomyces boulardii  250 mg Oral BID  . simvastatin  20 mg Oral QHS  . sodium bicarbonate  1,300 mg Oral TID  . vitamin C  500 mg Oral Daily  . DISCONTD: aspirin  81 mg Oral Daily  . DISCONTD: feeding supplement  237 mL Oral TID BM  . DISCONTD: insulin glargine  10 Units Subcutaneous QHS   No results found. BMET  Lab 11/27/10 0630 11/26/10 0650 11/25/10 0630 11/24/10 0620 11/23/10 0500  11/22/10 0335 11/22/10 0334 11/21/10 2044  NA 132* 132* 132* 135 135 133* -- 131*  K 4.3 3.8 3.6 3.8 3.5 4.1 -- 4.0  CL 100 102 102 102 101 98 -- 92*  CO2 14* 11* 12* 13* 15* 17* -- 17*  GLUCOSE 154* 245* 264* 179* 58* 200* -- 169*  BUN 100* 92* 84* 78* 82* 92* -- 96*  CREATININE 6.58* 6.23* 6.24* 6.54* 7.05* 7.83* 7.78* --  ALB -- -- -- -- -- -- -- --  CALCIUM 7.2* 7.1* 7.1* 7.0* 7.2* 7.4* -- 8.2*  PHOS 7.9* 8.3* 7.9* 6.7* -- -- -- --   CBC  Lab 11/27/10 0630 11/26/10 0650 11/25/10 0630 11/24/10 0620 11/21/10 2044  WBC 11.1* 10.4 5.1 7.7 --  NEUTROABS -- -- -- -- 7.4  HGB 7.7* 7.6* 7.5* 8.2* --  HCT 22.8* 22.4* 21.4* 24.1* --  MCV 85.4 85.5 84.9 84.9 --  PLT 214 210 182 194 --     Assessment/Plan:  1) Acute Renal failure. Progressive renal failure over last 5 months (Baseline Creatinine from [facility] as far back as 02/2010  appears to be 1.3-1.5). Suspected intrinsic renal disease, possible GN vs AIN (With interstitial nephritis component also from UTI). Creatinine remains significantly elevated and urine output charted is barely in the non-oliguric range. Multiple serologies negative and urine studied showed urine P/C ratio of 4 (Background of DM noted) with hypoalbuminemia raising further concerns for GN. On PO Prednisone at 60mg  per day. Interventional radiology will do renal biopsy tomorrow as the etiology of her ARF remain unexplained by labs/history. Started oral sodium bicarbonate for metabolic acidosis yesterday. BUN rising out of proportion to creatinine due to corticosteroids. Called the lab to get ANCA and ANti-GBM results but none available as yet (Spoke to [place] who said she would call [facility] later this morning). Not uremic and without any dialysis need identified at this time. Potentially may be able to discharge her home tomorrow after renal biopsy for outpatient follow up in 1-2 week with labs in the interval period. 2) DM 2, on insulin, fairly controlled in spite of  corticosteroids.  3) HTN. Clinically euvolemic and hypertensive. BP slightly elevated off IVFs, monitor.  4) CAD , hx CABG 2010  5) AS, s/p porcine AVR 2010  6) Anemia, likely secondary to RF. Fe sat 29% which is adequate. Will start ESA.  7) Hyperphosphatemia: Due to AKI.  8)GROUP B STREP(S.AGALACTIAE) UTI: on rocephin   Maye Parkinson Randall An at our lab was able to get Ms.Harbold's ANCA and Anti-GBM results from [place]. This increases the likelihood that her ARF on CKD3 is due to ATN or AIN and not an RPGN. I will decrease her prednisone to 40mg  PO q daily to avoid toxicity (especially rising BUN that would tip her over into symptomatic Uremia and force our hand at dialysis)

## 2010-11-27 NOTE — Progress Notes (Addendum)
[name] [id number] is a 77 y.o. female,  Outpatient Primary MD for the patient is Tillman Abide, MD, MD Chief Complaint  Patient presents with  . Urinary Tract Infection      11/27/2010   Subjective:   [name] today has, No headache, No chest pain, No abdominal pain - No Nausea, No new weakness tingling or numbness, No Cough.  Objective:   Filed Vitals:   11/26/10 1400 11/26/10 1800 11/26/10 2126 11/27/10 0529  BP: 135/70 145/62 138/64 157/72  Pulse: 67 68 69 66  Temp: 97.6 F (36.4 C) 97.7 F (36.5 C) 97.7 F (36.5 C) 97.7 F (36.5 C)  TempSrc: Oral Oral Oral Oral  Resp: 18 20 18 19   Height:      Weight:   58.2 kg (128 lb 4.9 oz)   SpO2: 99% 98% 98% 97%   Wt Readings from Last 3 Encounters:  11/26/10 58.2 kg (128 lb 4.9 oz)  09/29/10 51.03 kg (112 lb 8 oz)  08/18/10 53.071 kg (117 lb)    Intake/Output Summary (Last 24 hours) at 11/27/10 1418 Last data filed at 11/27/10 0700  Gross per 24 hour  Intake    110 ml  Output    500 ml  Net   -390 ml   Exam Awake Alert, Oriented *3, No new F.N deficits, Normal affect [place].AT,PERRAL Supple Neck,No JVD, No cervical lymphadenopathy appriciated.  Symmetrical Chest wall movement, Good air movement bilaterally, few rales RRR,No Gallops,Rubs or new Murmurs, No Parasternal Heave +ve B.Sounds, Abd Soft, Non tender, No organomegaly appriciated, No rebound -guarding or rigidity. No Cyanosis, Clubbing or edema, No new Rash or bruise    Data Review  CBC  Lab 11/27/10 0630 11/26/10 0650 11/25/10 0630 11/24/10 0620 11/23/10 0500 11/21/10 2044  WBC 11.1* 10.4 5.1 7.7 10.5 --  HGB 7.7* 7.6* 7.5* 8.2* 8.9* --  HCT 22.8* 22.4* 21.4* 24.1* 25.9* --  PLT 214 210 182 194 214 --  MCV 85.4 85.5 84.9 84.9 85.8 --  MCH 28.8 29.0 29.8 28.9 29.5 --  MCHC 33.8 33.9 35.0 34.0 34.4 --  RDW 13.7 13.7 13.5 13.5 13.4 --  LYMPHSABS -- -- -- -- -- 1.2  MONOABS -- -- -- -- -- 1.2*  EOSABS -- -- -- -- -- 0.4  BASOSABS -- --  -- -- -- 0.1  BANDABS -- -- -- -- -- --    Lab 11/27/10 0630 11/26/10 0650 11/25/10 0630 11/24/10 0620 11/23/10 0500  NA 132* 132* 132* 135 135  K 4.3 3.8 3.6 3.8 3.5  CL 100 102 102 102 101  CO2 14* 11* 12* 13* 15*  GLUCOSE 154* 245* 264* 179* 58*  BUN 100* 92* 84* 78* 82*  CREATININE 6.58* 6.23* 6.24* 6.54* 7.05*  CALCIUM 7.2* 7.1* 7.1* 7.0* 7.2*  MG -- -- -- -- --    Lab 11/26/10 1240  INR 1.15  PROTIME --   Cardiac markers: No results found for this basename: CK:3,CKMB:3,TROPONINI:3,MYOGLOBIN:3 in the last 168 hours No results found for this basename: POCBNP:3 in the last 168 hours Recent Results (from the past 240 hour(s))  URINE CULTURE     Status: Normal   Collection Time   11/21/10  8:49 PM      Component Value Range Status Comment   Specimen Description URINE, RANDOM   Final    Special Requests NONE   Final    Setup Time [id number]   Final    Colony Count 15,000 COLONIES/ML   Final  Culture     Final    Value: Multiple bacterial morphotypes present, none predominant. Suggest appropriate recollection if clinically indicated.   Report Status 11/23/2010 FINAL   Final   MRSA PCR SCREENING     Status: Abnormal   Collection Time   11/22/10  2:32 AM      Component Value Range Status Comment   MRSA by PCR POSITIVE (*) NEGATIVE  Final   URINE CULTURE     Status: Normal   Collection Time   11/22/10  4:26 AM      Component Value Range Status Comment   Specimen Description URINE, CATHETERIZED   Final    Special Requests NONE   Final    Setup Time [id number]   Final    Colony Count >=100,000 COLONIES/ML   Final    Culture     Final    Value: GROUP B STREP(S.AGALACTIAE)ISOLATED     Note: TESTING AGAINST S. AGALACTIAE NOT ROUTINELY PERFORMED DUE TO PREDICTABILITY OF AMP/PEN/VAN SUSCEPTIBILITY.   Report Status 11/23/2010 FINAL   Final   CULTURE, BLOOD (ROUTINE X 2)     Status: Normal (Preliminary result)   Collection Time   11/23/10  1:30 AM      Component Value  Range Status Comment   Specimen Description BLOOD RIGHT ARM   Final    Special Requests BOTTLES DRAWN AEROBIC AND ANAEROBIC 10CC EA   Final    Setup Time [id number]   Final    Culture     Final    Value:        BLOOD CULTURE RECEIVED NO GROWTH TO DATE CULTURE WILL BE HELD FOR 5 DAYS BEFORE ISSUING A FINAL NEGATIVE REPORT   Report Status PENDING   Incomplete   CULTURE, BLOOD (ROUTINE X 2)     Status: Normal (Preliminary result)   Collection Time   11/23/10  1:33 AM      Component Value Range Status Comment   Specimen Description BLOOD LEFT HAND   Final    Special Requests     Final    Value: BOTTLES DRAWN AEROBIC AND ANAEROBIC 10CC BLUE 5CC RED   Setup Time [id number]   Final    Culture     Final    Value:        BLOOD CULTURE RECEIVED NO GROWTH TO DATE CULTURE WILL BE HELD FOR 5 DAYS BEFORE ISSUING A FINAL NEGATIVE REPORT   Report Status PENDING   Incomplete     Radiology Reports Dg Chest 2 View  11/21/2010  *RADIOLOGY REPORT*  Clinical Data: Prior aortic valve replacement, abnormal labs  CHEST - 2 VIEW  Comparison: 07/18/2010  Findings: Chronic interstitial markings. No pleural effusion or pneumothorax.  The heart is top normal in size. Postsurgical changes related to prior CABG.  Aortic valve replacement.  Mild degenerative changes with curvature of the visualized thoracolumbar spine.  IMPRESSION: No evidence of acute cardiopulmonary disease.  Original Report Authenticated By: Charline Bills, M.D.   US Renal  11/22/2010  *RADIOLOGY REPORT*  Clinical Data: Acute on chronic renal failure, evaluate for obstruction  RENAL/URINARY TRACT ULTRASOUND COMPLETE  Comparison:  CT abdomen/pelvis dated 06/09/2010  Findings:  Right Kidney:  Measures 10.2 cm.  No mass or hydronephrosis. Increased parenchymal echogenicity, suggesting medical renal disease.  Left Kidney:  Measures 10.5 cm.  No mass or hydronephrosis. Increased parenchymal density, suggesting medical renal disease.  Bladder:   Decompressed by indwelling Foley catheter.  Additional comments:  Small right pleural effusion.  IMPRESSION: Increased parenchymal echogenicity, suggesting medical renal disease.  No hydronephrosis.  Small right pleural effusion.  Original Report Authenticated By: Charline Bills, M.D.   Scheduled Meds:    . aluminum hydroxide  30 mL Oral TID  . amLODipine  10 mg Oral Daily  . aspirin  81 mg Oral Daily  . Chlorhexidine Gluconate Cloth  6 each Topical Q0600  . cloNIDine  0.2 mg Oral TID  . estradiol  0.5 mg Oral Daily  . feeding supplement  237 mL Oral Daily  . ferrous fumarate  1 tablet Oral Daily  . folic acid  0.5 mg Oral Daily  . hydrALAZINE  10 mg Intravenous TID  . insulin aspart  0-5 Units Subcutaneous QHS  . insulin aspart  0-9 Units Subcutaneous TID WC  . insulin glargine  10 Units Subcutaneous BID  . levothyroxine  37.5 mcg Oral Custom  . levothyroxine  75 mcg Oral Custom  . magnesium hydroxide  30 mL Oral Once  . metoprolol  75 mg Oral BID  . mupirocin ointment  1 application Nasal BID  . predniSONE  40 mg Oral QAC breakfast  . saccharomyces boulardii  250 mg Oral BID  . simvastatin  20 mg Oral QHS  . sodium bicarbonate  1,300 mg Oral TID  . vitamin C  500 mg Oral Daily  . DISCONTD: cefTRIAXone (ROCEPHIN) IV  1 g Intravenous Q24H  . DISCONTD: predniSONE  60 mg Oral QAC breakfast   Continuous Infusions:  PRN Meds:.acetaminophen, acetaminophen, ALPRAZolam, bisacodyl, bisacodyl, HYDROcodone-acetaminophen, HYDROmorphone, ondansetron (ZOFRAN) IV, ondansetron, senna-docusate, DISCONTD: magnesium hydroxide  Assessment & Plan   Renal failure  - nephrology service following  - current work up including complement levels, ANA negative, and renal US suggestive of chronic renal medical disease  - patient is maintaining fair amount of urine output  - creatinine continues to trend down but unclear whether the response is secondary to solumedrol or IV hydration  - will follow up  on nephrology recommendations , Biopsy 11-26-10, ASA held today. - continue to monitor daily I's and O's    HYPOTHYROIDISM  - continue home dose synthroid   DIABETES MELLITUS, TYPE II  - pt is now on solumedrol so will need to increase Lantus to 10 units BID, high sugars steroid related. and this can be readjusted based on the level of control  CBG (last 3)   Basename 11/27/10 1142 11/27/10 0757 11/26/10 1723  GLUCAP 177* 141* 237*     HYPERLIPIDEMIA  - well controlled, continue statin   ANEMIA  - pt's baseline Hg = 8-9, now dropping  - will follow up on CBC , D/W Dr patel Clarene Critchley on 11-27-10, with lasix 20mg  before each. - continue Iron supplementation  - follow up on nephrology recommendations , ESA started by Renal.  HYPERTENSION  - continue Norvasc and Hydralazine   UTI (lower urinary tract infection)  Repeat Ur cultures, as mild Leukocytosis, DC rocephin as 5 days Rx done. ? New Infection.  Hypocalcemia & Hyperphosphatemia  - follow up on renal recommendations , low ion Calcium give 1 gm IV today  Mild Leukocytosis and mild SOB - few rales, check CXR 11-27-10  DVT Prophylaxis   SCDs  See all Orders from today for further details

## 2010-11-28 ENCOUNTER — Inpatient Hospital Stay (HOSPITAL_COMMUNITY): Payer: Medicare Other

## 2010-11-28 LAB — CBC
MCV: 84 fL (ref 78.0–100.0)
Platelets: 180 10*3/uL (ref 150–400)
RBC: 3.93 MIL/uL (ref 3.87–5.11)
WBC: 11.7 10*3/uL — ABNORMAL HIGH (ref 4.0–10.5)

## 2010-11-28 LAB — APTT: aPTT: 25 seconds (ref 24–37)

## 2010-11-28 LAB — PROTIME-INR: INR: 1.11 (ref 0.00–1.49)

## 2010-11-28 LAB — GLUCOSE, CAPILLARY: Glucose-Capillary: 195 mg/dL — ABNORMAL HIGH (ref 70–99)

## 2010-11-28 MED ORDER — MIDAZOLAM HCL 2 MG/2ML IJ SOLN
INTRAMUSCULAR | Status: AC
  Filled 2010-11-28: qty 4

## 2010-11-28 MED ORDER — SODIUM BICARBONATE 650 MG PO TABS: 1300.0000 mg | ORAL_TABLET | Freq: Three times a day (TID) | ORAL | Status: DC

## 2010-11-28 MED ORDER — SODIUM CHLORIDE 0.9 % IV SOLN
INTRAVENOUS | Status: AC | PRN
  Administered 2010-11-28: 1164 mL via INTRAVENOUS

## 2010-11-28 MED ORDER — MIDAZOLAM HCL 2 MG/2ML IJ SOLN
INTRAMUSCULAR | Status: AC
  Filled 2010-11-28: qty 2

## 2010-11-28 MED ORDER — FENTANYL CITRATE 0.05 MG/ML IJ SOLN
INTRAMUSCULAR | Status: AC
  Filled 2010-11-28: qty 4

## 2010-11-28 MED ORDER — CALCIUM CARBONATE 1250 (500 CA) MG PO TABS: 1.0000 | ORAL_TABLET | Freq: Three times a day (TID) | ORAL | Status: DC

## 2010-11-28 MED ORDER — PREDNISONE 20 MG PO TABS
30.0000 mg | ORAL_TABLET | Freq: Every day | ORAL | Status: DC
  Filled 2010-11-28: qty 1

## 2010-11-28 MED ORDER — MIDAZOLAM HCL 5 MG/5ML IJ SOLN
INTRAMUSCULAR | Status: AC | PRN
  Administered 2010-11-28: 1 mg via INTRAVENOUS

## 2010-11-28 MED ORDER — AMLODIPINE BESYLATE 5 MG PO TABS: 10.0000 mg | ORAL_TABLET | Freq: Every day | ORAL | Status: DC

## 2010-11-28 MED ORDER — SODIUM BICARBONATE 650 MG PO TABS
1300.0000 mg | ORAL_TABLET | Freq: Once | ORAL | Status: AC
  Administered 2010-11-28: 1300 mg via ORAL
  Filled 2010-11-28: qty 2

## 2010-11-28 MED ORDER — CALCIUM CARBONATE 1250 (500 CA) MG PO TABS
1.0000 | ORAL_TABLET | Freq: Three times a day (TID) | ORAL | Status: DC
  Administered 2010-11-28 (×2): 500 mg via ORAL
  Filled 2010-11-28 (×3): qty 1

## 2010-11-28 MED ORDER — INSULIN PEN STARTER KIT
1.0000 | Freq: Once | Status: AC
  Administered 2010-11-28: 1
  Filled 2010-11-28: qty 1

## 2010-11-28 MED ORDER — ALUMINUM HYDROXIDE GEL 320 MG/5ML PO SUSP: 30.0000 mL | Freq: Three times a day (TID) | ORAL | Status: DC

## 2010-11-28 MED ORDER — PREDNISONE 10 MG PO TABS: 30.0000 mg | ORAL_TABLET | Freq: Every day | ORAL | Status: AC

## 2010-11-28 MED ORDER — FOLIC ACID 0.5 MG HALF TAB: 0.5000 mg | ORAL_TABLET | Freq: Every day | ORAL | Status: DC

## 2010-11-28 MED ORDER — FENTANYL CITRATE 0.05 MG/ML IJ SOLN
INTRAMUSCULAR | Status: AC | PRN
  Administered 2010-11-28: 50 ug via INTRAVENOUS

## 2010-11-28 NOTE — Progress Notes (Signed)
PT HAS NO HH NEEDS AT THIS TIME AND SON STATES THAT SHE HAS A WALKER AT HOME.  WILL F/U AT DC.   Willa Rough 11/28/2010 [phone] OR [phone]

## 2010-11-28 NOTE — Progress Notes (Signed)
Pt was d/c home with son. HH PT was set up for pt. All d/c instructions and prescriptions were reviewed with pt and son. They both voiced an understanding. Pt was transported home with all of her belongings.

## 2010-11-28 NOTE — Progress Notes (Signed)
Pt returned from kidney biopsy with no complications. She has an bandaide present on her rt lower back with no drainage, swelling or redness present. Pt denies any pain. She states she does not want to take any medications right now because she is very sleepy. So will hold meds until pt wakes up more. Vital signs stable. Will continue to monitor.

## 2010-11-28 NOTE — Procedures (Signed)
Successful US guided biopsy of the right kidney.  No immediate complications.

## 2010-11-28 NOTE — Progress Notes (Addendum)
Physical Therapy Treatment Patient Details Name: [name] MRN: [id number] DOB: 1920/05/23 Today's Date: 11/28/2010  PT Assessment/Plan  PT - Assessment/Plan Comments on Treatment Session: Pt doing well even after procedure. MD present at start of treatment. PT still recommending 24 hour supervision initially until pt stronger however pt mobilizing well enough to D/C home. Pt also performed stairs today and her son was able to provide the level of assist needed for stair mobility at home. Pt's SpO2 remained >/= 95% with ambulation on room air.   PT Plan: Discharge plan remains appropriate;Frequency remains appropriate PT Frequency: Min 3X/week Follow Up Recommendations: Home health PT Equipment Recommended: None recommended by PT PT Goals  Acute Rehab PT Goals PT Goal Formulation: With patient/family Time For Goal Achievement: 7 days Pt will go Supine/Side to Sit: Independently PT Goal: Supine/Side to Sit - Progress: Progressing toward goal Pt will go Sit to Supine/Side: with modified independence PT Goal: Sit to Supine/Side - Progress: Progressing toward goal Pt will Transfer Sit to Stand/Stand to Sit: with modified independence PT Transfer Goal: Sit to Stand/Stand to Sit - Progress: Progressing toward goal Pt will Ambulate: 51 - 150 feet;with least restrictive assistive device;with modified independence PT Goal: Ambulate - Progress: Progressing toward goal Additional Goals Additional Goal #1: Pt will perform TUG or DGI within safe limits.   PT Goal: Additional Goal #1 - Progress: Progressing toward goal  PT Treatment Precautions/Restrictions  Precautions Precautions: Fall Restrictions Weight Bearing Restrictions: No Mobility (including Balance) Bed Mobility Bed Mobility: Yes Supine to Sit: 7: Independent Sit to Supine - Right: 6: Modified independent (Device/Increase time);With rail (increased time) Transfers Transfers: Yes Sit to Stand: 5: Supervision (supervision  secondary to earlier procedure) Sit to Stand Details (indicate cue type and reason): Verbal cues to push from the bed Stand to Sit: 5: Supervision Stand to Sit Details: Verbal cues to square up with bed, bring RW around prior to sitting.  Ambulation/Gait Ambulation/Gait: Yes Ambulation/Gait Assistance: 5: Supervision (Supervision secondary earlier procedure) Ambulation/Gait Assistance Details (indicate cue type and reason): Pt with mild gait unsteadiness - able to self correct with RW.  Ambulation Distance (Feet): 400 Feet Assistive device: Rolling walker Gait Pattern: Decreased stride length Stairs: Yes Stairs Assistance: 4: Min assist (HHA with 1 rail ) Stairs Assistance Details (indicate cue type and reason): Verbal cues for sequence (up with strong/down with weak); Son present to practice with assistance Stair Management Technique: Step to pattern;One rail Right Number of Stairs: 3     Exercise    End of Session PT - End of Session Equipment Utilized During Treatment: Gait belt Activity Tolerance: Patient tolerated treatment well Patient left: with family/visitor present;in bed (son present for entire session, pt requesting bed (sleepy)) General Behavior During Session: [facility] for tasks performed Cognition: [facility] for tasks performed  Sherie Don 11/28/2010, 3:38 PM  Sherie Don) Carleene Mains PT, DPT Acute Rehabilitation [phone]

## 2010-11-28 NOTE — Progress Notes (Signed)
Patient off unit

## 2010-11-28 NOTE — Discharge Summary (Signed)
[name], 77 y.o., DOB 07-Oct-1920, MRN [id number]. Admission date: 11/21/2010 Discharge Date 11/28/2010 Primary MD Tillman Abide, MD, MD Admitting Physician Iskra Magick-Myers  Admission Diagnosis  Diabetes mellitus [250] Coronary artery disease [414.00] Hyperlipidemia [272.4] GERD (gastroesophageal reflux disease) [530.81] Renal failure [586] Urinary tract infection [599.0] Anemia [285.9] Hypothyroidism [244.9] Hypertension [401.9] Renal insufficiency [593.9] kidney problems  Discharge Diagnosis   Principal Problem:  *Renal failure Active Problems:  HYPOTHYROIDISM  DIABETES MELLITUS, TYPE II  HYPERLIPIDEMIA  ANEMIA-NOS  ANXIETY  HYPERTENSION  CORONARY ARTERY DISEASE  UTI (lower urinary tract infection)  Hypocalcemia  Hyperphosphatemia      Past Medical History  Diagnosis Date  . Anemia     NOS  . Personal history of colonic polyps   . Diabetes mellitus     type II  . GERD (gastroesophageal reflux disease)   . Hyperlipidemia   . Hypertension   . Hypothyroidism   . Osteoarthritis   . Osteopenia   . Urinary incontinence   . Pulmonary hypertension   . Anxiety   . Renal insufficiency   . Coronary artery disease   . Aortic stenosis     Past Surgical History  Procedure Date  . Abdominal hysterectomy   . Tonsillectomy   . Cardiac catheterization 2000    cad  . Replacement total knee bilateral 05/1998  . Cataract extraction   . Coronary artery bypass graft     od  . Adenosine myoview 2007    benign, EF 69%  . Carotid endarterectomy 2011    Right  . Coronary artery bypass graft 2009  . Aortic valve replacement 2009  . Amputation-left great toe 7/12    Dr August Saucer    Consults  Renal=Jay Allena Katz  Significant Tests:  See full reports for all details     Dg Chest 2 View  11/21/2010  *RADIOLOGY REPORT*  Clinical Data: Prior aortic valve replacement, abnormal labs  CHEST - 2 VIEW  Comparison: 07/18/2010  Findings: Chronic interstitial markings. No pleural  effusion or pneumothorax.  The heart is top normal in size. Postsurgical changes related to prior CABG.  Aortic valve replacement.  Mild degenerative changes with curvature of the visualized thoracolumbar spine.  IMPRESSION: No evidence of acute cardiopulmonary disease.  Original Report Authenticated By: Charline Bills, M.D.   US Renal  11/22/2010  *RADIOLOGY REPORT*  Clinical Data: Acute on chronic renal failure, evaluate for obstruction  RENAL/URINARY TRACT ULTRASOUND COMPLETE  Comparison:  CT abdomen/pelvis dated 06/09/2010  Findings:  Right Kidney:  Measures 10.2 cm.  No mass or hydronephrosis. Increased parenchymal echogenicity, suggesting medical renal disease.  Left Kidney:  Measures 10.5 cm.  No mass or hydronephrosis. Increased parenchymal density, suggesting medical renal disease.  Bladder:  Decompressed by indwelling Foley catheter.  Additional comments:  Small right pleural effusion.  IMPRESSION: Increased parenchymal echogenicity, suggesting medical renal disease.  No hydronephrosis.  Small right pleural effusion.  Original Report Authenticated By: Charline Bills, M.D.   US Biopsy  11/28/2010  *RADIOLOGY REPORT*  Indication: Chronic renal insufficiency  ULTRASOUND GUIDED RENAL BIOPSY  Comparison: Abdominal ultrasound - 11/22/2010; abdominal CT - 06/09/2010  Medications: Fentanyl 50 mcg IV; Versed 1 mg IV  Total Moderate Sedation time: 10 minutes  Complications: None immediate  Procedure:  Informed written consent was obtained from the patient after a discussion of the risks, benefits and alternatives to treatment. The patient understands and consents to the procedure.  A timeout was performed prior to the initiation of the procedure.  Ultrasound  scanning was performed of the bilateral kidneys demonstrating a subjectively better right-sided sonographic window. As such, the right flank was prepped and draped in the usual sterile fashion.  The overlying soft tissues were anesthetized with 1%  lidocaine with epinephrine.  A 17 gauge core needle biopsy device was advanced into the inferior cortex of the right kidney and 2core biopsies were obtained under direct ultrasound guidance. Images were saved for documentation purposes.  The biopsy device was removed and hemostasis was obtained with manual compression. Post procedural scanning was negative for significant post procedural hemorrhage or hematoma.  A dressing was placed.  The patient tolerated the procedure well without immediate post procedural complication.  Impression:  Technically successful ultrasound guided right renal biopsy.  Original Report Authenticated By: Waynard Reeds, M.D.   Dg Chest Port 1 View  11/27/2010  *RADIOLOGY REPORT*  Clinical Data: Cough and shortness of breath.  PORTABLE CHEST - 1 VIEW  Comparison: Chest 11/21/2010.  Findings: The patient has new small bilateral pleural effusions and basilar airspace disease, greater on the left.  There is cardiomegaly but no pulmonary edema.  The patient is status post CABG and aortic valve replacement.  IMPRESSION: New small bilateral pleural effusions and basilar airspace disease, likely atelectasis, greater on the left.  Original Report Authenticated By: Bernadene Bell. Maricela Curet, M.D.    Hospital Course See H&P, Labs, Consult and Test reports for all details in brief, patient was admitted for    Renal failure Acute on CKD4 Clear etiology unknown, post serological workup and Biospy by IR today, Urine output decent, good apetite, no Nausea, D/W Dr Allena Katz DC home on PO prednisone he will see her in 1 week.  HYPOTHYROIDISM  - continue home dose synthroid    DIABETES MELLITUS, TYPE II  - steroid induced needed Insulin here, now on much lower dose Prednisone, sugars improved, please monitor sugars next visit, no Insulin ordered at DC  CBG (last 3)   Basename  11/27/10 1142  11/27/10 0757  11/26/10 1723   GLUCAP  177*  141*  237*     HYPERLIPIDEMIA - - well controlled,  continue statin   ANEMIA  AOCD with Dilutional drop , post 2 units PRBCs yesterday, outpt monitor.   HYPERTENSION  - continue Norvasc and Hydralazine   Mild Leukocytosis - no cough-SOB today, due to steroid.   Today   Subjective:   Ashiah Karpowicz today has no headache,no chest abdominal pain,no new weakness tingling or numbness, feels much better wants to go home today.   Objective:   Blood pressure 141/61, pulse 67, temperature 97.3 F (36.3 C), temperature source Oral, resp. rate 22, height 5\' 3"  (1.6 m), weight 58.2 kg (128 lb 4.9 oz), SpO2 96.00%.  Intake/Output Summary (Last 24 hours) at 11/28/10 1558 Last data filed at 11/28/10 1300  Gross per 24 hour  Intake   1825 ml  Output    975 ml  Net    850 ml    Exam Awake Alert, Oriented *3, No new F.N deficits, Normal affect [place].AT,PERRAL Supple Neck,No JVD, No cervical lymphadenopathy appriciated.  Symmetrical Chest wall movement, Good air movement bilaterally, CTAB RRR,No Gallops,Rubs or new Murmurs, No Parasternal Heave +ve B.Sounds, Abd Soft, Non tender, No organomegaly appriciated, No rebound -guarding or rigidity. No Cyanosis, Clubbing or edema, No new Rash or bruise  Data Review   CBC w Diff: Lab Results  Component Value Date   WBC 11.7* 11/28/2010   HGB 11.5* 11/28/2010   HCT  33.0* 11/28/2010   PLT 180 11/28/2010   LYMPHOPCT 11* 11/21/2010   MONOPCT 12 11/21/2010   EOSPCT 4 11/21/2010   BASOPCT 1 11/21/2010   CMP: Lab Results  Component Value Date   NA 132* 11/27/2010   K 4.3 11/27/2010   CL 100 11/27/2010   CO2 14* 11/27/2010   BUN 100* 11/27/2010   CREATININE 6.58* 11/27/2010   PROT 9.4* 11/21/2010   ALBUMIN 2.1* 11/27/2010   BILITOT 0.2* 11/21/2010   ALKPHOS 122* 11/21/2010   AST 10 11/21/2010   ALT <5 11/21/2010  . Discharge Instructions     Follow with Primary MD Tillman Abide, MD, MD in 3 days   Get CBC, CMP, checked 3 days by Primary MD and again as instructed by your Primary MD.    Get Medicines reviewed and adjusted.  Please request your Prim.MD to go over all Hospital Tests and Procedure/Radiological results at the follow up, please get all Hospital records sent to your Prim MD by signing hospital release before you go home.  Activity: Fall precautions use walker/cane & assistance as needed, do not lift anything heavy (>4 pounds) or do any strenuous activity for 1 week.  Do not drive if your were admitted for syncope or siezures until you have seen by Primary MD or a Neurologist and advised to drive.  Do not drive when taking Pain medications.   Wear Seat belts while driving.  Diet: Renal,  Fluid restriction 1.8 lit/day, Aspiration precautions.  Check your Weight same time everyday, if you gain over 2 pounds, or you develop in leg swelling, experience more shortness of breath or chest pain, call your Primary MD immediately. Follow Cardiac Low Salt Diet and 1.8 lit/day fluid restriction.  Disposition Home  If you experience worsening of your admission symptoms, develop shortness of breath, life threatening emergency, suicidal or homicidal thoughts you must seek medical attention immediately by calling 911 or calling your MD immediately  if symptoms less severe.  Do not take more than prescribed Pain, Sleep and Anxiety Medications  Special Instructions: If you have smoked or chewed Tobacco  in the last 2 yrs please stop smoking, stop any regular Alcohol  and or any Recreational drug use.  You Must read complete instructions/literature along with all the possible adverse reactions/side effects for all the Medicines you take and that have been prescribed to you. Take any new Medicines after you have completely understood and accpet all the possible adverse reactions/side effects.     Follow-up Information    Follow up with Tillman Abide, MD. Make an appointment in 3 days.   Contact information:   [address] [place]  [address] [phone]       Follow up with Dagoberto Ligas., MD. Make an appointment in 1 week.   Contact information:   [address] [facility] [place] [address] [phone]          Discharge Medications   Medication List  As of 11/28/2010  3:58 PM   START taking these medications         calcium carbonate 1250 MG tablet   Commonly known as: OS-CAL - dosed in mg of elemental calcium   Take 1 tablet (500 mg of elemental calcium total) by mouth 3 (three) times daily with meals.      predniSONE 10 MG tablet   Commonly known as: DELTASONE   Take 3 tablets (30 mg total) by mouth daily.  sodium bicarbonate 650 MG tablet   Take 2 tablets (1,300 mg total) by mouth 3 (three) times daily.         CHANGE how you take these medications         amLODipine 5 MG tablet   Commonly known as: NORVASC   Take 2 tablets (10 mg total) by mouth daily.   What changed: dose      folic acid 0.5 MG tablet   Commonly known as: FOLVITE   Take 0.5 tablets (0.5 mg total) by mouth daily.   What changed: - medication strength - dose         CONTINUE taking these medications         acetaminophen 500 MG tablet   Commonly known as: TYLENOL      ALPRAZolam 0.5 MG tablet   Commonly known as: XANAX      aspirin 81 MG tablet      cloNIDine 0.2 MG tablet   Commonly known as: CATAPRES      estradiol 0.5 MG tablet   Commonly known as: ESTRACE      ferrous fumarate 325 (106 FE) MG Tabs   Commonly known as: HEMOCYTE - 106 mg FE      levothyroxine 75 MCG tablet   Commonly known as: SYNTHROID, LEVOTHROID      metoprolol 50 MG tablet   Commonly known as: LOPRESSOR   Take 1.5 tablets (75 mg total) by mouth 2 (two) times daily.      simvastatin 20 MG tablet   Commonly known as: ZOCOR   Take 1 tablet (20 mg total) by mouth at bedtime.      Vitamin C 500 MG Chew         STOP taking these medications         ciprofloxacin 250 MG tablet      hydrALAZINE  50 MG tablet      PROBIOTIC PO          Where to get your medications    These are the prescriptions that you need to pick up.   You may get these medications from any pharmacy.         amLODipine 5 MG tablet   calcium carbonate 1250 MG tablet   folic acid 0.5 MG tablet   predniSONE 10 MG tablet   sodium bicarbonate 650 MG tablet            Total Time in preparing paper work, data evaluation and todays exam - 35 minutes  Signed: Leroy Sea 11/28/2010 3:58 PM  The patient was admitted to the Hospitalist Service.

## 2010-11-28 NOTE — Progress Notes (Signed)
S:S/P kidney biopsy today O:BP 147/68  Pulse 72  Temp(Src) 97.2 F (36.2 C) (Oral)  Resp 20  Ht 5\' 3"  (1.6 m)  Wt 58.2 kg (128 lb 4.9 oz)  BMI 22.73 kg/m2  SpO2 93%  Intake/Output Summary (Last 24 hours) at 11/28/10 1110 Last data filed at 11/28/10 1007  Gross per 24 hour  Intake   1825 ml  Output    600 ml  Net   1225 ml   Weight change:  [id number] post biopsy Ext:Tr edema    . aluminum hydroxide  30 mL Oral TID  . amLODipine  10 mg Oral Daily  . aspirin  81 mg Oral Daily  . calcium gluconate  1 g Intravenous Once  . Chlorhexidine Gluconate Cloth  6 each Topical Q0600  . cloNIDine  0.2 mg Oral TID  . estradiol  0.5 mg Oral Daily  . feeding supplement  237 mL Oral Daily  . fentaNYL      . ferrous fumarate  1 tablet Oral Daily  . folic acid  0.5 mg Oral Daily  . furosemide      . furosemide  20 mg Intravenous Q4H  . hydrALAZINE  10 mg Intravenous TID  . insulin aspart  0-5 Units Subcutaneous QHS  . insulin aspart  0-9 Units Subcutaneous TID WC  . insulin glargine  10 Units Subcutaneous BID  . levothyroxine  37.5 mcg Oral Custom  . levothyroxine  75 mcg Oral Custom  . metoprolol  75 mg Oral BID  . midazolam      . mupirocin ointment  1 application Nasal BID  . predniSONE  40 mg Oral QAC breakfast  . saccharomyces boulardii  250 mg Oral BID  . simvastatin  20 mg Oral QHS  . sodium bicarbonate  1,300 mg Oral TID  . vitamin C  500 mg Oral Daily  . DISCONTD: cefTRIAXone (ROCEPHIN) IV  1 g Intravenous Q24H  . DISCONTD: furosemide  20 mg Intravenous Q4H  . DISCONTD: predniSONE  60 mg Oral QAC breakfast   US Biopsy  11/28/2010  *RADIOLOGY REPORT*  Indication: Chronic renal insufficiency  ULTRASOUND GUIDED RENAL BIOPSY  Comparison: Abdominal ultrasound - 11/22/2010; abdominal CT - 06/09/2010  Medications: Fentanyl 50 mcg IV; Versed 1 mg IV  Total Moderate Sedation time: 10 minutes  Complications: None immediate  Procedure:  Informed written consent was obtained from the  patient after a discussion of the risks, benefits and alternatives to treatment. The patient understands and consents to the procedure.  A timeout was performed prior to the initiation of the procedure.  Ultrasound scanning was performed of the bilateral kidneys demonstrating a subjectively better right-sided sonographic window. As such, the right flank was prepped and draped in the usual sterile fashion.  The overlying soft tissues were anesthetized with 1% lidocaine with epinephrine.  A 17 gauge core needle biopsy device was advanced into the inferior cortex of the right kidney and 2core biopsies were obtained under direct ultrasound guidance. Images were saved for documentation purposes.  The biopsy device was removed and hemostasis was obtained with manual compression. Post procedural scanning was negative for significant post procedural hemorrhage or hematoma.  A dressing was placed.  The patient tolerated the procedure well without immediate post procedural complication.  Impression:  Technically successful ultrasound guided right renal biopsy.  Original Report Authenticated By: Waynard Reeds, M.D.   Dg Chest Port 1 View  11/27/2010  *RADIOLOGY REPORT*  Clinical Data: Cough and shortness of breath.  PORTABLE CHEST - 1 VIEW  Comparison: Chest 11/21/2010.  Findings: The patient has new small bilateral pleural effusions and basilar airspace disease, greater on the left.  There is cardiomegaly but no pulmonary edema.  The patient is status post CABG and aortic valve replacement.  IMPRESSION: New small bilateral pleural effusions and basilar airspace disease, likely atelectasis, greater on the left.  Original Report Authenticated By: Bernadene Bell. D'ALESSIO, M.D.   BMET  Lab 11/27/10 0630 11/26/10 4540 11/25/10 0630 11/24/10 9811 11/23/10 0500 11/22/10 0335 11/22/10 0334 11/21/10 2044  NA 132* 132* 132* 135 135 133* -- 131*  K 4.3 3.8 3.6 3.8 3.5 4.1 -- 4.0  CL 100 102 102 102 101 98 -- 92*  CO2 14* 11*  12* 13* 15* 17* -- 17*  GLUCOSE 154* 245* 264* 179* 58* 200* -- 169*  BUN 100* 92* 84* 78* 82* 92* -- 96*  CREATININE 6.58* 6.23* 6.24* 6.54* 7.05* 7.83* 7.78* --  ALB -- -- -- -- -- -- -- --  CALCIUM 7.2* 7.1* 7.1* 7.0* 7.2* 7.4* -- 8.2*  PHOS 7.9* 8.3* 7.9* 6.7* -- -- -- --   CBC  Lab 11/28/10 0805 11/27/10 0630 11/26/10 0650 11/25/10 0630 11/21/10 2044  WBC 11.7* 11.1* 10.4 5.1 --  NEUTROABS -- -- -- -- 7.4  HGB 11.5* 7.7* 7.6* 7.5* --  HCT 33.0* 22.8* 22.4* 21.4* --  MCV 84.0 85.4 85.5 84.9 --  PLT 180 214 210 182 --   Assessment/Plan: 1) Acute Renal failure. Progressive renal failure over last 5 months with negative serologies and biopsied today (Baseline Creatinine from EPIC as far back as 02/2010 appears to be 1.3-1.5).  Reduce prednisone to 30 mg per day.  Decide about dialysis initiation and prognosis after biopsy returned.  She has seen Dr. Val Eagle at [facility] in past.  To follow up with Dr. Allena Katz at [facility] and with weekly labs.  If ambulatory and stable OK for discharge. 2) DM 2, on insulin, fairly controlled in spite of corticosteroids.  3) HTN. Clinically euvolemic and hypertensive. BP slightly elevated off IVFs, monitor.  4) CAD , hx CABG 2010  5) AS, s/p porcine AVR 2010  6) Anemia, likely secondary to RF. Fe sat 29% which is adequate. Will start ESA.  7) Hyperphosphatemia: Due to AKI.  8)GROUP B STREP(S.AGALACTIAE) UTI: on rocephin,D/C foley  Delainie Chavana C

## 2010-11-29 LAB — TYPE AND SCREEN
ABO/RH(D): O POS
Antibody Screen: NEGATIVE
Unit division: 0

## 2010-11-29 LAB — CULTURE, BLOOD (ROUTINE X 2)
Culture  Setup Time: 201211181120
Culture: NO GROWTH

## 2010-12-01 ENCOUNTER — Telehealth: Payer: Self-pay | Admitting: *Deleted

## 2010-12-01 NOTE — Telephone Encounter (Signed)
Message copied by Sueanne Margarita on Mon Dec 01, 2010  1:00 PM ------      Message from: Tillman Abide I      Created: Mon Dec 01, 2010  9:41 AM       Please call her to see if she needs a hospital follow up appt      If she is seeing the kidney specialist, I probably don't need to see her right            Let her know I am happy that she got home

## 2010-12-01 NOTE — Telephone Encounter (Signed)
Spoke with patient and she couldn't speak well, and she states that [name] is off work and she has an appt in [employer].

## 2010-12-02 NOTE — Telephone Encounter (Signed)
Okay Probably seeing the nephrologist which is most important

## 2010-12-03 ENCOUNTER — Telehealth: Payer: Self-pay | Admitting: *Deleted

## 2010-12-03 NOTE — Telephone Encounter (Signed)
Okay to get the nurse in to do evaluation

## 2010-12-03 NOTE — Telephone Encounter (Signed)
Chris (patient's PT) called and requests a verbal order for a nursing assessment for the patient. She has increased bilateral leg swelling and her balance is still poor. Her BP is also staying elevated and he wants to make sure that she has a good understanding of her meds and is being compliant. He can be reached at [phone] or you can call Melissa at the office at [phone]

## 2010-12-04 ENCOUNTER — Telehealth: Payer: Self-pay

## 2010-12-04 LAB — GLUCOSE, CAPILLARY: Glucose-Capillary: 241 mg/dL — ABNORMAL HIGH (ref 70–99)

## 2010-12-04 NOTE — Telephone Encounter (Signed)
Yes, that would be appropriate 

## 2010-12-04 NOTE — Telephone Encounter (Signed)
Left message on VM for [facility] with results, advised to call if any questions

## 2010-12-04 NOTE — Telephone Encounter (Signed)
Ok to add skilled nursing? Please advise

## 2010-12-04 NOTE — Telephone Encounter (Signed)
Rodney Booze from [facility] request call back from Dr Karle Starch nurse Re; adding home health skilled nursing for pt due to bilateral lower extremity edema and issues managing her meds. PT already seeing pt. Please call Rodney Booze [phone].

## 2010-12-05 ENCOUNTER — Telehealth: Payer: Self-pay | Admitting: *Deleted

## 2010-12-05 NOTE — Telephone Encounter (Signed)
Okay to restart glipizide that she was on in past She had 5mg  tabs in past If she still has this, have her take 1/2 daily If out, have her take 2.5mg  tab daily with new Rx (#30 x 0)

## 2010-12-05 NOTE — Telephone Encounter (Signed)
Called patient and she seemed very confused and didn't really understand what I was saying.  I spoke to her caregiver, Cathey Endow who has just started working with patient and she didn't really know much about her medication.  Samson Frederic gave me the number for Billey Gosling who is the son of Harshika.  I called him and left a message on cell phone voicemail for him to return call.  [phone].

## 2010-12-05 NOTE — Telephone Encounter (Signed)
Spoke with Rolly Salter and advised her as instructed.

## 2010-12-05 NOTE — Telephone Encounter (Signed)
Pt calls with blood sugar of 325 today. She has an appt next week, but wants to know if she needs to do anything re blood sugar today. She is not on any DM meds

## 2010-12-08 ENCOUNTER — Ambulatory Visit (INDEPENDENT_AMBULATORY_CARE_PROVIDER_SITE_OTHER): Payer: Self-pay | Admitting: Internal Medicine

## 2010-12-08 ENCOUNTER — Other Ambulatory Visit: Payer: Self-pay | Admitting: *Deleted

## 2010-12-08 ENCOUNTER — Encounter: Payer: Self-pay | Admitting: Internal Medicine

## 2010-12-08 DIAGNOSIS — I10 Essential (primary) hypertension: Secondary | ICD-10-CM

## 2010-12-08 DIAGNOSIS — N19 Unspecified kidney failure: Secondary | ICD-10-CM

## 2010-12-08 DIAGNOSIS — E119 Type 2 diabetes mellitus without complications: Secondary | ICD-10-CM

## 2010-12-08 MED ORDER — FIRST-DUKES MOUTHWASH MT SUSP: 5.0000 mL | Freq: Four times a day (QID) | OROMUCOSAL | Status: DC | PRN

## 2010-12-08 MED ORDER — AMLODIPINE BESYLATE 10 MG PO TABS: 10.0000 mg | ORAL_TABLET | Freq: Every day | ORAL | Status: DC

## 2010-12-08 MED ORDER — GLIPIZIDE 5 MG PO TABS: 2.5000 mg | ORAL_TABLET | Freq: Every day | ORAL | Status: DC

## 2010-12-08 NOTE — Patient Instructions (Signed)
Take amlodipine 10mg  every morning --either 2 of the 5mg  or 1 of the 10mg  Restart glipizide for diabetes---take 1/2 of the 5mg  tablet before breakfast

## 2010-12-08 NOTE — Assessment & Plan Note (Signed)
Sugars over 200 Will restart glipizide at low dose

## 2010-12-08 NOTE — Telephone Encounter (Signed)
rx sent to pharmacy by e-script  

## 2010-12-08 NOTE — Telephone Encounter (Signed)
8 oz or so should be about right

## 2010-12-08 NOTE — Progress Notes (Signed)
  Subjective:    Patient ID: [name], female    DOB: 09/23/1920, 77 y.o.   MRN: [id number]  HPI    Review of Systems     Objective:   Physical Exam  Cardiovascular:       S2 with fixed splitting          Assessment & Plan:

## 2010-12-08 NOTE — Telephone Encounter (Signed)
Patient has appointment today at 11am

## 2010-12-08 NOTE — Progress Notes (Signed)
Subjective:    Patient ID: [name], female    DOB: 1920-04-20, 77 y.o.   MRN: [id number]  HPI Out of the hospital Here with [facility] --caregiver Creat last 6.5 Sugars have been running high---over 200 at times Tried to restart her glipizide but she was confused and didn't restart  Trouble getting BP meds straight Some edema in legs  Feels tired Has sore mouth Feels cold all the time  No chest pain Breathing is okay SOme dyspnea with exertion  Current Outpatient Prescriptions on File Prior to Visit  Medication Sig Dispense Refill  . acetaminophen (TYLENOL) 500 MG tablet Take 500 mg by mouth every 6 (six) hours as needed. For pain       . ALPRAZolam (XANAX) 0.5 MG tablet Take 0.25 mg by mouth at bedtime as needed. For anxiety      . amLODipine (NORVASC) 5 MG tablet Take 2 tablets (10 mg total) by mouth daily.  30 tablet  1  . Ascorbic Acid (VITAMIN C) 500 MG CHEW Chew 1 tablet by mouth daily.        Marland Kitchen aspirin 81 MG tablet Take 81 mg by mouth daily.        . calcium carbonate (OS-CAL - DOSED IN MG OF ELEMENTAL CALCIUM) 1250 MG tablet Take 1 tablet (500 mg of elemental calcium total) by mouth 3 (three) times daily with meals.  90 tablet  0  . cloNIDine (CATAPRES) 0.2 MG tablet Take 0.2 mg by mouth 3 (three) times daily.       Marland Kitchen estradiol (ESTRACE) 0.5 MG tablet Take 0.5 mg by mouth daily.        . ferrous fumarate (HEMOCYTE - 106 MG FE) 325 (106 FE) MG TABS Take 1 tablet by mouth daily.        . folic acid (FOLVITE) 0.5 MG tablet Take 0.5 tablets (0.5 mg total) by mouth daily.  30 tablet  0  . levothyroxine (SYNTHROID, LEVOTHROID) 75 MCG tablet Take 37.5-75 mcg by mouth daily. Take 37.5 mg on tues and thurs, 75 mg all other days      . metoprolol (LOPRESSOR) 50 MG tablet Take 1.5 tablets (75 mg total) by mouth 2 (two) times daily.  90 tablet  11  . predniSONE (DELTASONE) 10 MG tablet Take 3 tablets (30 mg total) by mouth daily.  30 tablet  0  . simvastatin (ZOCOR) 20 MG tablet Take  1 tablet (20 mg total) by mouth at bedtime.  30 tablet  11  . sodium bicarbonate 650 MG tablet Take 2 tablets (1,300 mg total) by mouth 3 (three) times daily.  90 tablet  1    Allergies  Allergen Reactions  . Captopril     REACTION: unspecified  . Enalapril Maleate     REACTION: cough  . Nitrofurantoin     REACTION: itching  . Ramipril     REACTION: unspecified  . Sulfa Antibiotics Other (See Comments)    Reaction unknown  . Verapamil     REACTION: unspecified    Past Medical History  Diagnosis Date  . Anemia     NOS  . Personal history of colonic polyps   . Diabetes mellitus     type II  . GERD (gastroesophageal reflux disease)   . Hyperlipidemia   . Hypertension   . Hypothyroidism   . Osteoarthritis   . Osteopenia   . Urinary incontinence   . Pulmonary hypertension   . Anxiety   . Renal insufficiency   .  Coronary artery disease   . Aortic stenosis     Past Surgical History  Procedure Date  . Abdominal hysterectomy   . Tonsillectomy   . Cardiac catheterization 2000    cad  . Replacement total knee bilateral 05/1998  . Cataract extraction   . Coronary artery bypass graft     od  . Adenosine myoview 2007    benign, EF 69%  . Carotid endarterectomy 2011    Right  . Coronary artery bypass graft 2009  . Aortic valve replacement 2009  . Amputation-left great toe 7/12    Dr August Saucer    No family history on file.  History   Social History  . Marital Status: Widowed    Spouse Name: [name]    Number of Children: 1  . Years of Education: [name]   Occupational History  . 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 Smoking status: Former Smoker -- 15 years    Types: Cigarettes  . Smokeless tobacco: Never Used  . Alcohol Use: Yes     Wine occasionally  . Drug Use: No  . Sexually Active: No   Other Topics Concern  . Not on file   Social History Narrative   Retired - Engineer, [employer], accts receivable     Review of  Systems Sleeps fair Appetite is not good---has to force herself to eat    Objective:   Physical Exam  Constitutional: She appears well-developed and well-nourished. No distress.  Neck: Normal range of motion.  Musculoskeletal: She exhibits edema.       2+ calf edema without pitting  Lymphadenopathy:    She has no cervical adenopathy.  Psychiatric:       Somewhat flat Mild confusion--trouble with our understanding to each other          Assessment & Plan:

## 2010-12-08 NOTE — Telephone Encounter (Signed)
Call from pharmacist at [facility] asking what size bottle should patient have? They mix this together and need to know the quantity of the bottle? In Epic it states 237 ML as the size, please advise

## 2010-12-08 NOTE — Assessment & Plan Note (Signed)
Will recheck labs May not be able to get creatinine down anymore ?considering renal stent? Would accept dialysis if she needs it

## 2010-12-08 NOTE — Assessment & Plan Note (Signed)
BP Readings from Last 3 Encounters:  12/08/10 154/82  11/28/10 161/65  09/29/10 160/78   Reasonable control Systolic was over 200 when first checked---after walking in

## 2010-12-11 DIAGNOSIS — E119 Type 2 diabetes mellitus without complications: Secondary | ICD-10-CM

## 2010-12-11 DIAGNOSIS — R269 Unspecified abnormalities of gait and mobility: Secondary | ICD-10-CM

## 2010-12-11 DIAGNOSIS — N179 Acute kidney failure, unspecified: Secondary | ICD-10-CM

## 2010-12-12 ENCOUNTER — Emergency Department (HOSPITAL_COMMUNITY): Payer: Medicare Other

## 2010-12-12 ENCOUNTER — Encounter (HOSPITAL_COMMUNITY): Payer: Self-pay | Admitting: *Deleted

## 2010-12-12 ENCOUNTER — Inpatient Hospital Stay (HOSPITAL_COMMUNITY)
Admission: EM | Admit: 2010-12-12 | Discharge: 2010-12-29 | DRG: 682 | Disposition: A | Discharge status: Skilled Nursing Facility | Payer: Medicare Other | Attending: Internal Medicine | Admitting: Internal Medicine

## 2010-12-12 ENCOUNTER — Telehealth: Payer: Self-pay | Admitting: Internal Medicine

## 2010-12-12 DIAGNOSIS — D631 Anemia in chronic kidney disease: Secondary | ICD-10-CM | POA: Diagnosis present

## 2010-12-12 DIAGNOSIS — R4182 Altered mental status, unspecified: Secondary | ICD-10-CM

## 2010-12-12 DIAGNOSIS — D696 Thrombocytopenia, unspecified: Secondary | ICD-10-CM | POA: Diagnosis present

## 2010-12-12 DIAGNOSIS — E039 Hypothyroidism, unspecified: Secondary | ICD-10-CM | POA: Diagnosis present

## 2010-12-12 DIAGNOSIS — N289 Disorder of kidney and ureter, unspecified: Secondary | ICD-10-CM

## 2010-12-12 DIAGNOSIS — Z954 Presence of other heart-valve replacement: Secondary | ICD-10-CM

## 2010-12-12 DIAGNOSIS — I2789 Other specified pulmonary heart diseases: Secondary | ICD-10-CM | POA: Diagnosis present

## 2010-12-12 DIAGNOSIS — G9341 Metabolic encephalopathy: Secondary | ICD-10-CM | POA: Diagnosis present

## 2010-12-12 DIAGNOSIS — N189 Chronic kidney disease, unspecified: Secondary | ICD-10-CM | POA: Diagnosis present

## 2010-12-12 DIAGNOSIS — Z860100 Personal history of colon polyps, unspecified: Secondary | ICD-10-CM

## 2010-12-12 DIAGNOSIS — R609 Edema, unspecified: Secondary | ICD-10-CM | POA: Insufficient documentation

## 2010-12-12 DIAGNOSIS — Z882 Allergy status to sulfonamides status: Secondary | ICD-10-CM

## 2010-12-12 DIAGNOSIS — Z96659 Presence of unspecified artificial knee joint: Secondary | ICD-10-CM

## 2010-12-12 DIAGNOSIS — S68118A Complete traumatic metacarpophalangeal amputation of other finger, initial encounter: Secondary | ICD-10-CM

## 2010-12-12 DIAGNOSIS — Z888 Allergy status to other drugs, medicaments and biological substances status: Secondary | ICD-10-CM

## 2010-12-12 DIAGNOSIS — N2581 Secondary hyperparathyroidism of renal origin: Secondary | ICD-10-CM | POA: Diagnosis present

## 2010-12-12 DIAGNOSIS — D649 Anemia, unspecified: Secondary | ICD-10-CM

## 2010-12-12 DIAGNOSIS — Z66 Do not resuscitate: Secondary | ICD-10-CM | POA: Diagnosis present

## 2010-12-12 DIAGNOSIS — I739 Peripheral vascular disease, unspecified: Secondary | ICD-10-CM | POA: Diagnosis present

## 2010-12-12 DIAGNOSIS — E119 Type 2 diabetes mellitus without complications: Secondary | ICD-10-CM | POA: Diagnosis present

## 2010-12-12 DIAGNOSIS — N185 Chronic kidney disease, stage 5: Secondary | ICD-10-CM

## 2010-12-12 DIAGNOSIS — S98119A Complete traumatic amputation of unspecified great toe, initial encounter: Secondary | ICD-10-CM

## 2010-12-12 DIAGNOSIS — E8779 Other fluid overload: Secondary | ICD-10-CM | POA: Diagnosis present

## 2010-12-12 DIAGNOSIS — Z951 Presence of aortocoronary bypass graft: Secondary | ICD-10-CM

## 2010-12-12 DIAGNOSIS — G9340 Encephalopathy, unspecified: Secondary | ICD-10-CM | POA: Diagnosis present

## 2010-12-12 DIAGNOSIS — I120 Hypertensive chronic kidney disease with stage 5 chronic kidney disease or end stage renal disease: Secondary | ICD-10-CM | POA: Diagnosis present

## 2010-12-12 DIAGNOSIS — N186 End stage renal disease: Secondary | ICD-10-CM | POA: Diagnosis present

## 2010-12-12 DIAGNOSIS — I2510 Atherosclerotic heart disease of native coronary artery without angina pectoris: Secondary | ICD-10-CM | POA: Diagnosis present

## 2010-12-12 DIAGNOSIS — N179 Acute kidney failure, unspecified: Principal | ICD-10-CM | POA: Diagnosis present

## 2010-12-12 DIAGNOSIS — N390 Urinary tract infection, site not specified: Secondary | ICD-10-CM | POA: Diagnosis present

## 2010-12-12 DIAGNOSIS — E871 Hypo-osmolality and hyponatremia: Secondary | ICD-10-CM

## 2010-12-12 DIAGNOSIS — E43 Unspecified severe protein-calorie malnutrition: Secondary | ICD-10-CM | POA: Diagnosis not present

## 2010-12-12 DIAGNOSIS — E1129 Type 2 diabetes mellitus with other diabetic kidney complication: Secondary | ICD-10-CM | POA: Diagnosis present

## 2010-12-12 LAB — CBC
HCT: 28.1 % — ABNORMAL LOW (ref 36.0–46.0)
Hemoglobin: 9.4 g/dL — ABNORMAL LOW (ref 12.0–15.0)
MCH: 28.6 pg (ref 26.0–34.0)
MCHC: 33.5 g/dL (ref 30.0–36.0)

## 2010-12-12 LAB — DIFFERENTIAL
Basophils Relative: 0 % (ref 0–1)
Eosinophils Absolute: 0 10*3/uL (ref 0.0–0.7)
Monocytes Absolute: 0.1 10*3/uL (ref 0.1–1.0)
Neutro Abs: 12 10*3/uL — ABNORMAL HIGH (ref 1.7–7.7)
Neutrophils Relative %: 97 % — ABNORMAL HIGH (ref 43–77)

## 2010-12-12 LAB — POCT I-STAT, CHEM 8
BUN: >140 mg/dL — ABNORMAL HIGH (ref 6–23)
Calcium, Ion: 1.04 mmol/L — ABNORMAL LOW (ref 1.12–1.32)
Creatinine, Ser: 6.4 mg/dL — ABNORMAL HIGH (ref 0.50–1.10)
TCO2: 17 mmol/L (ref 0–100)

## 2010-12-12 LAB — URINALYSIS, MICROSCOPIC ONLY
Bilirubin Urine: NEGATIVE
Ketones, ur: 15 mg/dL — AB
Nitrite: NEGATIVE
Urobilinogen, UA: 0.2 mg/dL (ref 0.0–1.0)
pH: 5 (ref 5.0–8.0)

## 2010-12-12 MED ORDER — ESTRADIOL 1 MG PO TABS
0.5000 mg | ORAL_TABLET | Freq: Every day | ORAL | Status: DC
  Administered 2010-12-13 – 2010-12-15 (×3): 0.5 mg via ORAL
  Administered 2010-12-16: 10:00:00 via ORAL
  Administered 2010-12-17 – 2010-12-21 (×5): 0.5 mg via ORAL
  Administered 2010-12-22: 16:00:00 via ORAL
  Administered 2010-12-23: 0.5 mg via ORAL
  Administered 2010-12-24 – 2010-12-26 (×2): via ORAL
  Administered 2010-12-27: 1 mg via ORAL
  Administered 2010-12-28: 0.5 mg via ORAL
  Administered 2010-12-29: 11:00:00 via ORAL
  Filled 2010-12-12 (×18): qty 0.5

## 2010-12-12 MED ORDER — AMLODIPINE BESYLATE 10 MG PO TABS
10.0000 mg | ORAL_TABLET | Freq: Every day | ORAL | Status: DC
  Administered 2010-12-13 – 2010-12-22 (×10): 10 mg via ORAL
  Filled 2010-12-12 (×11): qty 1

## 2010-12-12 MED ORDER — ASPIRIN 81 MG PO CHEW
81.0000 mg | CHEWABLE_TABLET | Freq: Every day | ORAL | Status: DC
  Filled 2010-12-12: qty 1

## 2010-12-12 MED ORDER — SODIUM CHLORIDE 0.45 % IV SOLN
INTRAVENOUS | Status: DC
  Administered 2010-12-13: 06:00:00 via INTRAVENOUS

## 2010-12-12 MED ORDER — FOLIC ACID 0.5 MG HALF TAB
0.5000 mg | ORAL_TABLET | Freq: Every day | ORAL | Status: DC
  Administered 2010-12-13 – 2010-12-22 (×10): 0.5 mg via ORAL
  Filled 2010-12-12 (×11): qty 1

## 2010-12-12 MED ORDER — FERROUS FUMARATE 325 (106 FE) MG PO TABS
1.0000 | ORAL_TABLET | Freq: Every day | ORAL | Status: DC
  Administered 2010-12-13: 106 mg via ORAL
  Administered 2010-12-14: 324 mg via ORAL
  Administered 2010-12-15 – 2010-12-21 (×7): 106 mg via ORAL
  Filled 2010-12-12 (×9): qty 1

## 2010-12-12 MED ORDER — CALCIUM CARBONATE 1250 (500 CA) MG PO TABS
1.0000 | ORAL_TABLET | Freq: Three times a day (TID) | ORAL | Status: DC
  Administered 2010-12-13 – 2010-12-29 (×37): 500 mg via ORAL
  Filled 2010-12-12 (×57): qty 1

## 2010-12-12 MED ORDER — SODIUM CHLORIDE 0.9 % IV SOLN
INTRAVENOUS | Status: DC
  Administered 2010-12-12: 17:00:00 via INTRAVENOUS

## 2010-12-12 MED ORDER — SIMVASTATIN 20 MG PO TABS
20.0000 mg | ORAL_TABLET | Freq: Every day | ORAL | Status: DC
  Administered 2010-12-14 – 2010-12-28 (×15): 20 mg via ORAL
  Filled 2010-12-12 (×17): qty 1

## 2010-12-12 MED ORDER — CLONIDINE HCL 0.2 MG PO TABS
0.2000 mg | ORAL_TABLET | Freq: Three times a day (TID) | ORAL | Status: DC
  Administered 2010-12-13 – 2010-12-23 (×27): 0.2 mg via ORAL
  Filled 2010-12-12 (×34): qty 1

## 2010-12-12 MED ORDER — METOPROLOL TARTRATE 50 MG PO TABS
75.0000 mg | ORAL_TABLET | Freq: Two times a day (BID) | ORAL | Status: DC
  Administered 2010-12-13 – 2010-12-20 (×15): 75 mg via ORAL
  Administered 2010-12-20: 50 mg via ORAL
  Administered 2010-12-21 – 2010-12-23 (×5): 75 mg via ORAL
  Filled 2010-12-12 (×24): qty 1

## 2010-12-12 MED ORDER — FIRST-DUKES MOUTHWASH MT SUSP: 5.0000 mL | Freq: Four times a day (QID) | OROMUCOSAL | Status: DC

## 2010-12-12 MED ORDER — SODIUM BICARBONATE 650 MG PO TABS
1300.0000 mg | ORAL_TABLET | Freq: Three times a day (TID) | ORAL | Status: DC
  Administered 2010-12-13 – 2010-12-14 (×5): 1300 mg via ORAL
  Filled 2010-12-12 (×12): qty 2

## 2010-12-12 MED ORDER — DEXTROSE 5 % IV SOLN
1.0000 g | Freq: Once | INTRAVENOUS | Status: AC
  Administered 2010-12-12: 1 g via INTRAVENOUS
  Filled 2010-12-12: qty 10

## 2010-12-12 MED ORDER — LEVOTHYROXINE SODIUM 75 MCG PO TABS
37.5000 ug | ORAL_TABLET | Freq: Every day | ORAL | Status: DC
  Filled 2010-12-12 (×2): qty 1

## 2010-12-12 NOTE — Telephone Encounter (Signed)
Spoke with patient's son and advised results, he will call us with an update.

## 2010-12-12 NOTE — ED Notes (Signed)
Patient has hx of recent kidney problems.  She was inpatient and d/c home.  Patient was fatigued last night.  She was assisted to bed by family.  Patient noted to have altered mental status.  She had garbled speech when family found her sitting on toilet.  Patient is more alert at this time.  Patient has had low grade temp intermittently.  Her vital signs has varied as well.  cbg 230.   Family concerned due to significant change in her condition.  Patient has noted swelling in her left arm that is new.  She has ongoing swelling in both of her legs.

## 2010-12-12 NOTE — ED Provider Notes (Signed)
History     CSN: [id number] Arrival date & time: 12/12/2010  2:07 PM   First MD Initiated Contact with Patient 12/12/10 1608      Chief Complaint  Patient presents with  . Fatigue  . Altered Mental Status    (Consider location/radiation/quality/duration/timing/severity/associated sxs/prior treatment) Patient is a 77 y.o. female presenting with altered mental status. The history is provided by the patient, a relative and medical records.  Altered Mental Status   the patient is an 77 year old female, with hypertension, diabetes, and cardiac disease.  She lives in a nursing home and has a caretaker with her most of the day.  Her son checks on her daily as well.  He says he saw her yesterday, and she had a low-grade, temperature and was acting confused.  Today.  He checked on her again, and she was confused and her temperature had decreased to 29F.  Her physical therapist also noted that she had swelling in her hands so.  He reccomended that the patient be seen by her physician.  The son contacted Dr. Alphonsus Sias, and he recommended that she come to the emergency department for evaluation.  The son states that she has had a slight cough.  There is no history of vomiting or diarrhea.  The patient denies pain anywhere.  She has a known history of renal insufficiency.  However, she does not get dialysis.  Past Medical History  Diagnosis Date  . Anemia     NOS  . Personal history of colonic polyps   . Diabetes mellitus     type II  . GERD (gastroesophageal reflux disease)   . Hyperlipidemia   . Hypertension   . Hypothyroidism   . Osteoarthritis   . Osteopenia   . Urinary incontinence   . Pulmonary hypertension   . Anxiety   . Renal insufficiency   . Coronary artery disease   . Aortic stenosis     Past Surgical History  Procedure Date  . Abdominal hysterectomy   . Tonsillectomy   . Cardiac catheterization 2000    cad  . Replacement total knee bilateral 05/1998  . Cataract  extraction   . Coronary artery bypass graft     od  . Adenosine myoview 2007    benign, EF 69%  . Carotid endarterectomy 2011    Right  . Coronary artery bypass graft 2009  . Aortic valve replacement 2009  . Amputation-left great toe 7/12    Dr August Saucer    No family history on file.  History  Substance Use Topics  . Smoking status: Former Smoker -- 15 years    Types: Cigarettes  . Smokeless tobacco: Never Used  . Alcohol Use: Yes     Wine occasionally    OB History    Grav Para Term Preterm Abortions TAB SAB Ect Mult Living                  Review of Systems  Unable to perform ROS Psychiatric/Behavioral: Positive for altered mental status.    Allergies  Captopril; Enalapril maleate; Nitrofurantoin; Ramipril; Sulfa antibiotics; and Verapamil  Home Medications   Current Outpatient Rx  Name Route Sig Dispense Refill  . ACETAMINOPHEN 500 MG PO TABS Oral Take 500 mg by mouth every 6 (six) hours as needed. For pain     . ALPRAZOLAM 0.5 MG PO TABS Oral Take 0.25 mg by mouth at bedtime as needed. For anxiety    . AMLODIPINE BESYLATE 10 MG PO TABS Oral  Take 1 tablet (10 mg total) by mouth daily. 30 tablet 11  . VITAMIN C 500 MG PO CHEW Oral Chew 1 tablet by mouth daily.      . ASPIRIN 81 MG PO TABS Oral Take 81 mg by mouth daily.      Marland Kitchen CALCIUM CARBONATE 1250 MG PO TABS Oral Take 1 tablet (500 mg of elemental calcium total) by mouth 3 (three) times daily with meals. 90 tablet 0  . CLONIDINE HCL 0.2 MG PO TABS Oral Take 0.2 mg by mouth 3 (three) times daily.     Marland Kitchen FIRST-DUKES MOUTHWASH MT SUSP Mouth/Throat Use as directed 5 mLs in the mouth or throat 4 (four) times daily as needed. 237 mL 1    Swish and spit out  . ESTRADIOL 0.5 MG PO TABS Oral Take 0.5 mg by mouth daily.      Marland Kitchen FERROUS FUMARATE 325 (106 FE) MG PO TABS Oral Take 1 tablet by mouth daily.      Marland Kitchen FOLIC ACID 0.5 MG HALF TAB Oral Take 0.5 tablets (0.5 mg total) by mouth daily. 30 tablet 0  . GLIPIZIDE 5 MG PO TABS  Oral Take 0.5 tablets (2.5 mg total) by mouth daily. 30 tablet 5  . LEVOTHYROXINE SODIUM 75 MCG PO TABS Oral Take 37.5-75 mcg by mouth daily. Take 37.5 mg on tues and thurs, 75 mg all other days    . METOPROLOL TARTRATE 50 MG PO TABS Oral Take 1.5 tablets (75 mg total) by mouth 2 (two) times daily. 90 tablet 11  . SIMVASTATIN 20 MG PO TABS Oral Take 1 tablet (20 mg total) by mouth at bedtime. 30 tablet 11  . SODIUM BICARBONATE 650 MG PO TABS Oral Take 2 tablets (1,300 mg total) by mouth 3 (three) times daily. 90 tablet 1    BP 164/57  Pulse 85  Temp(Src) 97.5 F (36.4 C) (Oral)  Resp 20  SpO2 93%  Physical Exam  Vitals reviewed. Constitutional: She appears well-developed and well-nourished.  HENT:  Head: Normocephalic and atraumatic.  Eyes: Pupils are equal, round, and reactive to light.  Neck: Normal range of motion. No JVD present.       No carotid bruits.  No JVD  Cardiovascular: Normal rate and regular rhythm.   Murmur heard. Pulmonary/Chest: Effort normal and breath sounds normal. No respiratory distress. She has no wheezes. She has no rales.  Abdominal: Soft. She exhibits no distension and no mass. There is no tenderness. There is no rebound and no guarding.  Musculoskeletal: Normal range of motion. She exhibits no edema and no tenderness.  Neurological: No cranial nerve deficit.       Somnolent  Skin: Skin is warm and dry.  Psychiatric: Her behavior is normal.    ED Course  Procedures (including critical care time)  Labs Reviewed  CBC - Abnormal; Notable for the following:    WBC 12.3 (*)    RBC 3.29 (*)    Hemoglobin 9.4 (*)    HCT 28.1 (*)    Platelets 59 (*)    All other components within normal limits  DIFFERENTIAL - Abnormal; Notable for the following:    Neutrophils Relative 97 (*)    Lymphocytes Relative 2 (*)    Monocytes Relative 1 (*)    Neutro Abs 12.0 (*)    Lymphs Abs 0.2 (*)    All other components within normal limits  POCT I-STAT, CHEM 8 -  Abnormal; Notable for the following:    BUN >  140 (*)    Creatinine, Ser 6.40 (*)    Glucose, Bld 252 (*)    Calcium, Ion 1.04 (*)    Hemoglobin 9.9 (*)    HCT 29.0 (*)    All other components within normal limits  URINALYSIS, MICROSCOPIC ONLY  I-STAT, CHEM 8   No results found.   I spoke with the [facility] hospitalist.  He agreed to admit the patient to the hospital for further evaluation and treatment.  I told him that the patient already has a nephrologist, Dr. Allena Katz, who is following her renal insufficiency.  In addition to discussing her presentation.  I told him I have started IV antibiotics.   MDM  Urinary tract infection , causing, confusion and weakness, and change in behavior Renal insufficiency Anemia Pulmonary edema Nontoxic.  No respiratory distress        Nicholes Stairs, MD 12/12/10 [place] Fruit

## 2010-12-12 NOTE — Telephone Encounter (Signed)
Patient's son called and I also talked with the physical therapist and they stated she is incoherent and her hands are swelling her BP this morning was 190/97 and when they just took it was 181/81, BS is 230, Temp 96.0 and PT stated that she has definitely declined since he seen her a couple of days ago and suggest they she be seen here today or at the ER.  Please advise.

## 2010-12-12 NOTE — ED Notes (Signed)
Called to give report to [phone]. Nurse unavailable. Awaiting return call.

## 2010-12-12 NOTE — Telephone Encounter (Signed)
Needs to go to ER  Sounds like her kidneys may have worsened

## 2010-12-12 NOTE — H&P (Signed)
DATE OF ADMISSION:  12/12/2010  PCP:    Tillman Abide, MD, MD   Chief Complaint:  Confusion, lethargy  HPI: [name] is an 77 y.o. female with increased confusion past 24 hours, and lethargy.  Patient is unable to give a history and her son is at the bedside and gives the history.  She was last seen normal yesterday, and the son reports that patient has been eating and drinking well, he states that she has been undergoing a renal workup due to worsening of =her kidney numbers over the past few months.  He states her creatinine had been 7.6, and she is undergoing a workup by renal, and had a renal biopsy and they are awaiting the results for causes of her renal problems, before the options are to be discussed such as dialysis. She has been seen by Dr. Allena Katz of [facility], and a renal specialist  [facility].   Her ED workup revealed a UTI, and a BUN of >140, and a Creatinine of 6.4.  The son also denies that she has had any bloody stools or melena.  Her Hb= 9.4, and she is on  supplemental Iron therapy.    Past Medical History  Diagnosis Date  . Anemia     NOS  . Personal history of colonic polyps   . Diabetes mellitus     type II  . GERD (gastroesophageal reflux disease)   . Hyperlipidemia   . Hypertension   . Hypothyroidism   . Osteoarthritis   . Osteopenia   . Urinary incontinence   . Pulmonary hypertension   . Anxiety   . Renal insufficiency   . Coronary artery disease   . Aortic stenosis     Past Surgical History  Procedure Date  . Abdominal hysterectomy   . Tonsillectomy   . Cardiac catheterization 2000    cad  . Replacement total knee bilateral 05/1998  . Cataract extraction   . Coronary artery bypass graft     od  . Adenosine myoview 2007    benign, EF 69%  . Carotid endarterectomy 2011    Right  . Coronary artery bypass graft 2009  . Aortic valve replacement 2009  . Amputation-left great toe 7/12    Dr August Saucer  . Traumatic amputation of right dip  joint of index finger     Medications:  HOME MEDS: Prior to Admission medications   Medication Sig Start Date End Date Taking? Authorizing Provider  acetaminophen (TYLENOL) 500 MG tablet Take 500 mg by mouth every 6 (six) hours as needed. For pain    Yes Historical Provider, MD  ALPRAZolam Prudy Feeler) 0.5 MG tablet Take 0.25 mg by mouth at bedtime as needed. For anxiety   Yes Historical Provider, MD  amLODipine (NORVASC) 10 MG tablet Take 1 tablet (10 mg total) by mouth daily. 12/08/10 12/08/11 Yes Varney Baas, MD  Ascorbic Acid (VITAMIN C) 500 MG CHEW Chew 1 tablet by mouth daily.     Yes Historical Provider, MD  aspirin 81 MG tablet Take 81 mg by mouth daily.     Yes Historical Provider, MD  calcium carbonate (OS-CAL - DOSED IN MG OF ELEMENTAL CALCIUM) 1250 MG tablet Take 1 tablet (500 mg of elemental calcium total) by mouth 3 (three) times daily with meals. 11/28/10 11/28/11 Yes Leroy Sea, MD  cloNIDine (CATAPRES) 0.2 MG tablet Take 0.2 mg by mouth 3 (three) times daily.  07/11/10  Yes Historical Provider, MD  Diphenhyd-Hydrocort-Nystatin (FIRST-DUKES MOUTHWASH)  SUSP Use as directed 5 mLs in the mouth or throat 4 (four) times daily as needed. 12/08/10  Yes Varney Baas, MD  estradiol (ESTRACE) 0.5 MG tablet Take 0.5 mg by mouth daily.     Yes Historical Provider, MD  ferrous fumarate (HEMOCYTE - 106 MG FE) 325 (106 FE) MG TABS Take 1 tablet by mouth daily.     Yes Historical Provider, MD  folic acid (FOLVITE) 0.5 MG tablet Take 0.5 tablets (0.5 mg total) by mouth daily. 11/28/10 11/28/11 Yes Leroy Sea, MD  glipiZIDE (GLUCOTROL) 5 MG tablet Take 0.5 tablets (2.5 mg total) by mouth daily. 12/08/10  Yes Varney Baas, MD  levothyroxine (SYNTHROID, LEVOTHROID) 75 MCG tablet Take 37.5-75 mcg by mouth daily. Take 37.5 mg on tues and thurs, 75 mg all other days   Yes Historical Provider, MD  metoprolol (LOPRESSOR) 50 MG tablet Take 1.5 tablets (75 mg total) by mouth 2 (two) times  daily. 08/01/10  Yes Valera Castle, MD  simvastatin (ZOCOR) 20 MG tablet Take 1 tablet (20 mg total) by mouth at bedtime. 08/12/10  Yes Valera Castle, MD  sodium bicarbonate 650 MG tablet Take 2 tablets (1,300 mg total) by mouth 3 (three) times daily. 11/28/10 11/28/11 Yes Leroy Sea, MD    Allergies:  Allergies  Allergen Reactions  . Captopril     REACTION: unspecified  . Enalapril Maleate     REACTION: cough  . Nitrofurantoin     REACTION: itching  . Ramipril     REACTION: unspecified  . Sulfa Antibiotics Other (See Comments)    Reaction unknown  . Verapamil     REACTION: unspecified    Social History:   reports that she has quit smoking. Her smoking use included Cigarettes. She quit after 15 years of use. She has never used smokeless tobacco. She reports that she drinks alcohol. She reports that she does not use illicit drugs.  Family History: No family history on file.  Review of Systems:  The patient denies anorexia, fever, weight loss, vision loss, decreased hearing, hoarseness, chest pain, syncope, dyspnea on exertion, peripheral edema, balance deficits, hemoptysis, abdominal pain, melena, hematochezia, severe indigestion/heartburn, hematuria, incontinence, genital sores, muscle weakness, suspicious skin lesions, transient blindness, difficulty walking, depression, unusual weight change, abnormal bleeding, enlarged lymph nodes, angioedema, and breast masses.  Physical Exam:  GEN:  Elderly obtunded 77 year old female examined  and in no acute distress. Filed Vitals:   12/12/10 1413 12/12/10 1828 12/12/10 2204  BP: 164/57 162/62 172/63  Pulse: 85 79 85  Temp: 97.5 F (36.4 C) 97.3 F (36.3 C) 97.5 F (36.4 C)  TempSrc: Oral Oral Oral  Resp: 20 16   SpO2: 93% 91% 95%   Blood pressure 172/63, pulse 85, temperature 97.5 F (36.4 C), temperature source Oral, resp. rate 16, SpO2 95.00%. PSYCH: SHe is alert and oriented x1; awake but sluggish. HEENT: Normocephalic and  Atraumatic, Mucous membranes pink; PERRLA; EOM intact; Fundi:  Benign;  No scleral icterus, Nares: Patent, Oropharynx: Coated tongue yellowish exudate Neck:  FROM, no cervical lymphadenopathy nor thyromegaly or carotid bruit; no JVD; Breasts:: Not examined CHEST WALL: No tenderness CHEST: Normal respiration, clear to auscultation bilaterally HEART: Regular rate and rhythm; no murmurs rubs or gallops BACK: No kyphosis or scoliosis; no CVA tenderness ABDOMEN: Positive Bowel Sounds, Scaphoid, Obese, soft non-tender; no masses, no organomegaly, no pannus; no intertriginous candida. Rectal Exam: Not done EXTREMITIES: No bone or joint deformity; age-appropriate arthropathy of the hands  and knees; no cyanosis, clubbing or edema; no ulcerations. Genitalia: not examined PULSES: 2+ and symmetric SKIN: Normal hydration no rash or ulceration CNS: Cranial nerves 2-12 grossly intact no focal neurologic deficit   Labs & Imaging Results for orders placed during the hospital encounter of 12/12/10 (from the past 48 hour(s))  CBC     Status: Abnormal   Collection Time   12/12/10  3:33 PM      Component Value Range Comment   WBC 12.3 (*) 4.0 - 10.5 (K/uL)    RBC 3.29 (*) 3.87 - 5.11 (MIL/uL)    Hemoglobin 9.4 (*) 12.0 - 15.0 (g/dL)    HCT 16.1 (*) 09.6 - 46.0 (%)    MCV 85.4  78.0 - 100.0 (fL)    MCH 28.6  26.0 - 34.0 (pg)    MCHC 33.5  30.0 - 36.0 (g/dL)    RDW 04.5  40.9 - 81.1 (%)    Platelets 59 (*) 150 - 400 (K/uL)   DIFFERENTIAL     Status: Abnormal   Collection Time   12/12/10  3:33 PM      Component Value Range Comment   Neutrophils Relative 97 (*) 43 - 77 (%)    Lymphocytes Relative 2 (*) 12 - 46 (%)    Monocytes Relative 1 (*) 3 - 12 (%)    Eosinophils Relative 0  0 - 5 (%)    Basophils Relative 0  0 - 1 (%)    Neutro Abs 12.0 (*) 1.7 - 7.7 (K/uL)    Lymphs Abs 0.2 (*) 0.7 - 4.0 (K/uL)    Monocytes Absolute 0.1  0.1 - 1.0 (K/uL)    Eosinophils Absolute 0.0  0.0 - 0.7 (K/uL)    Basophils  Absolute 0.0  0.0 - 0.1 (K/uL)    WBC Morphology MILD LEFT SHIFT (1-5% METAS, OCC MYELO, OCC BANDS)      Smear Review LARGE PLATELETS PRESENT     POCT I-STAT, CHEM 8     Status: Abnormal   Collection Time   12/12/10  4:00 PM      Component Value Range Comment   Sodium 137  135 - 145 (mEq/L)    Potassium 4.8  3.5 - 5.1 (mEq/L)    Chloride 107  96 - 112 (mEq/L)    BUN >140 (*) 6 - 23 (mg/dL)    Creatinine, Ser 9.14 (*) 0.50 - 1.10 (mg/dL)    Glucose, Bld 782 (*) 70 - 99 (mg/dL)    Calcium, Ion 9.56 (*) 1.12 - 1.32 (mmol/L)    TCO2 17  0 - 100 (mmol/L)    Hemoglobin 9.9 (*) 12.0 - 15.0 (g/dL)    HCT 21.3 (*) 08.6 - 46.0 (%)   URINALYSIS, MICROSCOPIC ONLY     Status: Abnormal   Collection Time   12/12/10  4:48 PM      Component Value Range Comment   Color, Urine YELLOW  YELLOW     APPearance CLOUDY (*) CLEAR     Specific Gravity, Urine 1.014  1.005 - 1.030     pH 5.0  5.0 - 8.0     Glucose, UA 250 (*) NEGATIVE (mg/dL)    Hgb urine dipstick LARGE (*) NEGATIVE     Bilirubin Urine NEGATIVE  NEGATIVE     Ketones, ur 15 (*) NEGATIVE (mg/dL)    Protein, ur >578 (*) NEGATIVE (mg/dL)    Urobilinogen, UA 0.2  0.0 - 1.0 (mg/dL)    Nitrite NEGATIVE  NEGATIVE  Leukocytes, UA SMALL (*) NEGATIVE     WBC, UA 3-6  <3 (WBC/hpf)    RBC / HPF 21-50  <3 (RBC/hpf)    Bacteria, UA MANY (*) RARE     Squamous Epithelial / LPF RARE  RARE     Urine-Other AMORPHOUS URATES/PHOSPHATES   RARE YEAST   Dg Chest 2 View  12/12/2010  *RADIOLOGY REPORT*  Clinical Data: Worsening hypoxia, cough, and confusion. Hypertension and diabetes.  Coronary artery disease.  CHEST - 2 VIEW  Comparison: 11/27/2010  Findings: Worsening diffuse bilateral pulmonary air space disease is seen since previous study, consistent with pulmonary edema. This is superimposed on COPD.  Tiny pleural effusions are seen bilaterally.  Cardiomegaly remains stable.  The patient has undergone previous CABG and aortic valve replacement.  IMPRESSION:   1.  Worsening diffuse pulmonary edema/congestive heart failure, superimposed on COPD. 2.  Tiny bilateral pleural effusions. 3.  Stable cardiomegaly.  Original Report Authenticated By: Danae Orleans, M.D.      Assessment/Plan: 1.  ARF with CKD Stage V but not on Dialysis- Gentle IVFs, Consult Renal, may need Dialysis, Foley placed for I/Os.   2.  UTI- Urine for C+S ordered, IV Rocephin started, adjust Antibiotic per Culture result.  3.  Metabolic Encephalopathy- due to #1, and 2 4.  Anemia- due to Renal disease, and rule out occult GI bleeding, FOBT once daily X 3.   5.  Type 2 DM- SSI coverage from IVFs elevated blood sugars, check HbA1C.  6.  HTN-  Continue regular antihypertensives.   7.  PVD- stable.   8.  CAD- stable 9.  Other plans as per orders.    CODE STATUS:      FULL CODE       Emrie Gayle C 12/12/2010, 10:20 PM

## 2010-12-12 NOTE — ED Notes (Signed)
Spoke with Windell Moulding on 5000. She states she is unable to accept pt due pt needing telemetry. Called for room change.

## 2010-12-12 NOTE — ED Notes (Signed)
MD at bedside (caparossi to discuss plan for admission with patient/family)

## 2010-12-12 NOTE — ED Notes (Signed)
[id number] ready

## 2010-12-12 NOTE — ED Notes (Signed)
Pt continues to rest quietly in bed. Resp even and unlabored. Denies pain. Son at bedside. Awaiting admission.

## 2010-12-13 ENCOUNTER — Inpatient Hospital Stay (HOSPITAL_COMMUNITY): Payer: Medicare Other

## 2010-12-13 ENCOUNTER — Encounter (HOSPITAL_COMMUNITY): Payer: Self-pay | Admitting: General Practice

## 2010-12-13 DIAGNOSIS — G9340 Encephalopathy, unspecified: Secondary | ICD-10-CM | POA: Diagnosis present

## 2010-12-13 DIAGNOSIS — D696 Thrombocytopenia, unspecified: Secondary | ICD-10-CM | POA: Diagnosis present

## 2010-12-13 DIAGNOSIS — N390 Urinary tract infection, site not specified: Secondary | ICD-10-CM | POA: Diagnosis present

## 2010-12-13 DIAGNOSIS — Z992 Dependence on renal dialysis: Secondary | ICD-10-CM | POA: Diagnosis present

## 2010-12-13 LAB — HEMOGLOBIN A1C: Hgb A1c MFr Bld: 8.2 % — ABNORMAL HIGH (ref <5.7)

## 2010-12-13 LAB — CBC
Hemoglobin: 9.8 g/dL — ABNORMAL LOW (ref 12.0–15.0)
Platelets: 65 10*3/uL — ABNORMAL LOW (ref 150–400)
RBC: 3.37 MIL/uL — ABNORMAL LOW (ref 3.87–5.11)
WBC: 12.5 10*3/uL — ABNORMAL HIGH (ref 4.0–10.5)

## 2010-12-13 LAB — BASIC METABOLIC PANEL
CO2: 18 mEq/L — ABNORMAL LOW (ref 19–32)
Calcium: 8.7 mg/dL (ref 8.4–10.5)
GFR calc non Af Amer: 6 mL/min — ABNORMAL LOW (ref >90)
Potassium: 4.4 mEq/L (ref 3.5–5.1)
Sodium: 138 mEq/L (ref 135–145)

## 2010-12-13 LAB — IRON AND TIBC: Saturation Ratios: 26 % (ref 20–55)

## 2010-12-13 LAB — RETICULOCYTES
RBC.: 3 MIL/uL — ABNORMAL LOW (ref 3.87–5.11)
Retic Ct Pct: 0.7 % (ref 0.4–3.1)

## 2010-12-13 LAB — DIC (DISSEMINATED INTRAVASCULAR COAGULATION)PANEL
D-Dimer, Quant: 1.58 ug/mL-FEU — ABNORMAL HIGH (ref 0.00–0.48)
Fibrinogen: 643 mg/dL — ABNORMAL HIGH (ref 204–475)
INR: 1.04 (ref 0.00–1.49)
Prothrombin Time: 13.8 seconds (ref 11.6–15.2)
aPTT: 29 seconds (ref 24–37)

## 2010-12-13 LAB — ALT: ALT: 24 U/L (ref 0–35)

## 2010-12-13 LAB — VITAMIN B12: Vitamin B-12: 530 pg/mL (ref 211–911)

## 2010-12-13 LAB — HEPATIC FUNCTION PANEL
Albumin: 2.2 g/dL — ABNORMAL LOW (ref 3.5–5.2)
Alkaline Phosphatase: 82 U/L (ref 39–117)
Bilirubin, Direct: 0.1 mg/dL (ref 0.0–0.3)
Indirect Bilirubin: 0.3 mg/dL (ref 0.3–0.9)
Total Bilirubin: 0.4 mg/dL (ref 0.3–1.2)

## 2010-12-13 LAB — GLUCOSE, CAPILLARY
Glucose-Capillary: 211 mg/dL — ABNORMAL HIGH (ref 70–99)
Glucose-Capillary: 94 mg/dL (ref 70–99)

## 2010-12-13 LAB — HEPATITIS B SURFACE ANTIGEN: Hepatitis B Surface Ag: NEGATIVE

## 2010-12-13 MED ORDER — ALTEPLASE 2 MG IJ SOLR
2.0000 mg | Freq: Once | INTRAMUSCULAR | Status: AC | PRN
  Filled 2010-12-13: qty 2

## 2010-12-13 MED ORDER — INSULIN ASPART 100 UNIT/ML ~~LOC~~ SOLN
0.0000 [IU] | Freq: Every day | SUBCUTANEOUS | Status: DC
  Administered 2010-12-16: 2 [IU] via SUBCUTANEOUS

## 2010-12-13 MED ORDER — PENTAFLUOROPROP-TETRAFLUOROETH EX AERO: 1.0000 "application " | INHALATION_SPRAY | CUTANEOUS | Status: DC | PRN

## 2010-12-13 MED ORDER — HEPARIN SODIUM (PORCINE) 1000 UNIT/ML DIALYSIS
1000.0000 [IU] | INTRAMUSCULAR | Status: DC | PRN
  Filled 2010-12-13: qty 1

## 2010-12-13 MED ORDER — ONDANSETRON HCL 4 MG PO TABS: 4.0000 mg | ORAL_TABLET | Freq: Four times a day (QID) | ORAL | Status: DC | PRN

## 2010-12-13 MED ORDER — INSULIN ASPART 100 UNIT/ML ~~LOC~~ SOLN
0.0000 [IU] | Freq: Three times a day (TID) | SUBCUTANEOUS | Status: DC
  Administered 2010-12-13: 3 [IU] via SUBCUTANEOUS
  Administered 2010-12-13 – 2010-12-15 (×3): 2 [IU] via SUBCUTANEOUS
  Administered 2010-12-16: 5 [IU] via SUBCUTANEOUS
  Administered 2010-12-16: 3 [IU] via SUBCUTANEOUS
  Administered 2010-12-17: 2 [IU] via SUBCUTANEOUS
  Administered 2010-12-17: 3 [IU] via SUBCUTANEOUS
  Administered 2010-12-17: 5 [IU] via SUBCUTANEOUS
  Filled 2010-12-13: qty 3

## 2010-12-13 MED ORDER — ACETAMINOPHEN 650 MG RE SUPP: 650.0000 mg | Freq: Four times a day (QID) | RECTAL | Status: DC | PRN

## 2010-12-13 MED ORDER — ACETAMINOPHEN 325 MG PO TABS
650.0000 mg | ORAL_TABLET | Freq: Four times a day (QID) | ORAL | Status: DC | PRN
  Administered 2010-12-15 – 2010-12-21 (×3): 650 mg via ORAL
  Filled 2010-12-13 (×3): qty 2

## 2010-12-13 MED ORDER — OXYCODONE HCL 5 MG PO TABS: 5.0000 mg | ORAL_TABLET | ORAL | Status: DC | PRN

## 2010-12-13 MED ORDER — LIDOCAINE-PRILOCAINE 2.5-2.5 % EX CREA
1.0000 "application " | TOPICAL_CREAM | CUTANEOUS | Status: DC | PRN
  Filled 2010-12-13: qty 5

## 2010-12-13 MED ORDER — DEXTROSE 5 % IV SOLN
1.0000 g | INTRAVENOUS | Status: DC
  Administered 2010-12-13 – 2010-12-14 (×2): 1 g via INTRAVENOUS
  Filled 2010-12-13 (×3): qty 10

## 2010-12-13 MED ORDER — HYDROMORPHONE HCL PF 1 MG/ML IJ SOLN: 0.5000 mg | INTRAMUSCULAR | Status: DC | PRN

## 2010-12-13 MED ORDER — HEPARIN SODIUM (PORCINE) 1000 UNIT/ML DIALYSIS
20.0000 [IU]/kg | INTRAMUSCULAR | Status: DC | PRN
  Administered 2010-12-13 – 2010-12-18 (×2): 1100 [IU] via INTRAVENOUS_CENTRAL
  Filled 2010-12-13: qty 2

## 2010-12-13 MED ORDER — SODIUM CHLORIDE 0.9 % IV SOLN: 100.0000 mL | INTRAVENOUS | Status: DC | PRN

## 2010-12-13 MED ORDER — ONDANSETRON HCL 4 MG/2ML IJ SOLN: 4.0000 mg | Freq: Four times a day (QID) | INTRAMUSCULAR | Status: DC | PRN

## 2010-12-13 MED ORDER — LEVOTHYROXINE SODIUM 75 MCG PO TABS
37.5000 ug | ORAL_TABLET | ORAL | Status: DC
  Administered 2010-12-16 – 2010-12-23 (×2): 37.5 ug via ORAL
  Filled 2010-12-13 (×5): qty 0.5

## 2010-12-13 MED ORDER — PANTOPRAZOLE SODIUM 40 MG IV SOLR
40.0000 mg | INTRAVENOUS | Status: DC
  Administered 2010-12-13 – 2010-12-15 (×3): 40 mg via INTRAVENOUS
  Filled 2010-12-13 (×3): qty 40

## 2010-12-13 MED ORDER — NEPRO/CARBSTEADY PO LIQD
237.0000 mL | ORAL | Status: DC | PRN
  Administered 2010-12-28: 237 mL via ORAL

## 2010-12-13 MED ORDER — MAGIC MOUTHWASH
5.0000 mL | Freq: Four times a day (QID) | ORAL | Status: DC
  Administered 2010-12-13 – 2010-12-29 (×51): 5 mL via ORAL
  Filled 2010-12-13 (×70): qty 5

## 2010-12-13 MED ORDER — LEVOTHYROXINE SODIUM 75 MCG PO TABS
75.0000 ug | ORAL_TABLET | ORAL | Status: DC
  Administered 2010-12-13 – 2010-12-29 (×11): 75 ug via ORAL
  Filled 2010-12-13 (×15): qty 1

## 2010-12-13 MED ORDER — LIDOCAINE HCL (PF) 1 % IJ SOLN
5.0000 mL | INTRAMUSCULAR | Status: DC | PRN
  Filled 2010-12-13: qty 5

## 2010-12-13 MED ORDER — ENOXAPARIN SODIUM 40 MG/0.4ML ~~LOC~~ SOLN
40.0000 mg | SUBCUTANEOUS | Status: DC
  Filled 2010-12-13: qty 0.4

## 2010-12-13 NOTE — Consults (Addendum)
[name] 12/13/2010 [name] D Requesting Physician:  Dr. Rito Ehrlich  Reason for Consult:  Progressive renal failure HPI: The patient is a 77 y.o. year-old WF with recent onset renal failure admitted for SOB , gen weakness and mild confusion.  The patient was recently admitted for progressive renal failure in mid November.  Serologies were negative, including ANCA, and she was d/c 'd home after renal biopsy with results pending on prednisone 40 mg/d.  She was supposed to f/u with Dr. Allena Katz this week but became ill.  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     Past Medical History:  Past Medical History  Diagnosis Date  . Anemia     NOS  . Personal history of colonic polyps   . Diabetes mellitus     type II  . GERD (gastroesophageal reflux disease)   . Hyperlipidemia   . Hypertension   . Hypothyroidism   . Osteoarthritis   . Osteopenia   . Urinary incontinence   . Pulmonary hypertension   . Anxiety   . Renal insufficiency   . Coronary artery disease   . Aortic stenosis     Past Surgical History:  Past Surgical History  Procedure Date  . Abdominal hysterectomy   . Tonsillectomy   . Cardiac catheterization 2000    cad  . Replacement total knee bilateral 05/1998  . Cataract extraction   . Coronary artery bypass graft     od  . Adenosine myoview 2007    benign, EF 69%  . Carotid endarterectomy 2011    Right  . Coronary artery bypass graft 2009  . Aortic valve replacement 2009  . Amputation-left great toe 7/12    Dr August Saucer  . Traumatic amputation of right dip joint of index finger     Family History: History reviewed. No pertinent family history. Social History:  reports that she has quit smoking. Her smoking use included Cigarettes. She quit after 15 years of use. She has never used smokeless tobacco. She reports that she drinks alcohol. She reports that she does  not use illicit drugs.  Allergies:  Allergies  Allergen Reactions  . Captopril     REACTION: unspecified  . Enalapril Maleate     REACTION: cough  . Nitrofurantoin     REACTION: itching  . Ramipril     REACTION: unspecified  . Sulfa Antibiotics Other (See Comments)    Reaction unknown  . Verapamil     REACTION: unspecified    Home medications: Prior to Admission medications   Medication Sig Start Date End Date Taking? Authorizing Provider  acetaminophen (TYLENOL) 500 MG tablet Take 500 mg by mouth every 6 (six) hours as needed. For pain    Yes Historical Provider, MD  ALPRAZolam Prudy Feeler) 0.5 MG tablet Take 0.25 mg by mouth at bedtime as needed. For anxiety   Yes Historical Provider, MD  amLODipine (NORVASC) 10 MG tablet Take 1 tablet (10 mg total) by mouth daily. 12/08/10 12/08/11 Yes Varney Baas, MD  Ascorbic Acid (VITAMIN C) 500 MG CHEW Chew 1 tablet by mouth daily.     Yes Historical Provider, MD  aspirin 81 MG tablet Take 81 mg by mouth daily.     Yes Historical Provider, MD  calcium carbonate (OS-CAL - DOSED IN MG OF ELEMENTAL CALCIUM) 1250 MG tablet Take 1 tablet (500 mg of elemental calcium total) by mouth 3 (three) times daily  with meals. 11/28/10 11/28/11 Yes Leroy Sea, MD  cloNIDine (CATAPRES) 0.2 MG tablet Take 0.2 mg by mouth 3 (three) times daily.  07/11/10  Yes Historical Provider, MD  Diphenhyd-Hydrocort-Nystatin (FIRST-DUKES MOUTHWASH) SUSP Use as directed 5 mLs in the mouth or throat 4 (four) times daily as needed. 12/08/10  Yes Varney Baas, MD  estradiol (ESTRACE) 0.5 MG tablet Take 0.5 mg by mouth daily.     Yes Historical Provider, MD  ferrous fumarate (HEMOCYTE - 106 MG FE) 325 (106 FE) MG TABS Take 1 tablet by mouth daily.     Yes Historical Provider, MD  folic acid (FOLVITE) 0.5 MG tablet Take 0.5 tablets (0.5 mg total) by mouth daily. 11/28/10 11/28/11 Yes Leroy Sea, MD  glipiZIDE (GLUCOTROL) 5 MG tablet Take 0.5 tablets (2.5 mg total) by  mouth daily. 12/08/10  Yes Varney Baas, MD  levothyroxine (SYNTHROID, LEVOTHROID) 75 MCG tablet Take 37.5-75 mcg by mouth daily. Take 37.5 mg on tues and thurs, 75 mg all other days   Yes Historical Provider, MD  metoprolol (LOPRESSOR) 50 MG tablet Take 1.5 tablets (75 mg total) by mouth 2 (two) times daily. 08/01/10  Yes Valera Castle, MD  simvastatin (ZOCOR) 20 MG tablet Take 1 tablet (20 mg total) by mouth at bedtime. 08/12/10  Yes Valera Castle, MD  sodium bicarbonate 650 MG tablet Take 2 tablets (1,300 mg total) by mouth 3 (three) times daily. 11/28/10 11/28/11 Yes Leroy Sea, MD    Inpatient medications:    . amLODipine  10 mg Oral Daily  . calcium carbonate  1 tablet Oral TID WC  . cefTRIAXone (ROCEPHIN)  IV  1 g Intravenous Once  . cefTRIAXone (ROCEPHIN)  IV  1 g Intravenous Q24H  . cloNIDine  0.2 mg Oral TID  . estradiol  0.5 mg Oral Daily  . ferrous fumarate  1 tablet Oral Daily  . folic acid  0.5 mg Oral Daily  . insulin aspart  0-5 Units Subcutaneous QHS  . insulin aspart  0-9 Units Subcutaneous TID WC  . levothyroxine  37.5 mcg Oral Custom  . levothyroxine  75 mcg Oral Custom  . magic mouthwash  5 mL Oral QID  . metoprolol  75 mg Oral BID  . pantoprazole (PROTONIX) IV  40 mg Intravenous Q24H  . simvastatin  20 mg Oral QHS  . sodium bicarbonate  1,300 mg Oral TID  . DISCONTD: aspirin  81 mg Oral Daily  . DISCONTD: enoxaparin  40 mg Subcutaneous Q24H  . DISCONTD: FIRST-DUKES MOUTHWASH  5 mL Mouth/Throat QID  . DISCONTD: levothyroxine  37.5-75 mcg Oral Daily    Review of Systems Gen:  Denies headache, fever, chills, sweats.  No weight loss. HEENT:  No visual change, sore throat, difficulty swallowing. Resp:  + DOE.  No cough or hemoptysis. Cardiac:  No chest pain, orthopnea, PND.  + edema. GI:   Denies abdominal pain.   + nausea GU:  Denies difficulty or change in voiding.  No change in urine color.     MS:  Denies joint pain or swelling.   Derm:  Denies skin  rash or itching.  No chronic skin conditions.  Neuro:   Denies hx stroke or TIA.   Psych:  Denies symptoms of depression of anxiety.  No hallucination.    Physical Exam:  Blood pressure 155/72, pulse 82, temperature 97.6 F (36.4 C), temperature source Oral, resp. rate 19, weight 56.246 kg (124 lb), SpO2 93.00%.  Gen: pleasant  elderly female, slightly SOB  Skin: 1+ edema upper ext and 2+ pitting edema lower ext Neck: + jvd Chest: bibasilar crackles Heart: 2/6 SEM, regular, no rub Abdomen: soft, NT, + bs Ext: no ischemic changes, no rash Neuro: alert, + asterixis Heme/Lymph: no LAN Psych: calm and pleasant  Labs: Basic Metabolic Panel:  Lab 12/13/10 1478 12/12/10 1600  NA 138 137  K 4.4 4.8  CL 99 107  CO2 18* --  GLUCOSE 201* 252*  BUN 140* >140*  CREATININE 5.85* 6.40*  ALB -- --  CALCIUM 8.7 --  PHOS -- --   Liver Function Tests:  Lab 12/13/10 1528  AST 12  ALT 24  ALKPHOS 82  BILITOT 0.4  PROT 6.1  ALBUMIN 2.2*   No results found for this basename: LIPASE:3,AMYLASE:3 in the last 168 hours No results found for this basename: AMMONIA:3 in the last 168 hours CBC:  Lab 12/13/10 0919 12/13/10 0200 12/12/10 1600 12/12/10 1533  WBC -- 12.5* -- 12.3*  NEUTROABS -- -- -- 12.0*  HGB -- 9.8* 9.9* 9.4*  HCT -- 28.5* 29.0* 28.1*  MCV -- 84.6 -- 85.4  PLT 63* 65* -- 59*   PT/INR: @labrcntip (inr:5) Cardiac Enzymes: No results found for this basename: CKTOTAL:5,CKMB:5,CKMBINDEX:5,TROPONINI:5 in the last 168 hours CBG:  Lab 12/13/10 1214 12/13/10 0746  GLUCAP 211* 183*    Iron Studies: No results found for this basename: IRON:30,TIBC:30,TRANSFERRIN:30,FERRITIN:30 in the last 168 hours  Xrays/Other Studies: Dg Chest 2 View  12/12/2010  *RADIOLOGY REPORT*  Clinical Data: Worsening hypoxia, cough, and confusion. Hypertension and diabetes.  Coronary artery disease.  CHEST - 2 VIEW  Comparison: 11/27/2010  Findings: Worsening diffuse bilateral pulmonary air space  disease is seen since previous study, consistent with pulmonary edema. This is superimposed on COPD.  Tiny pleural effusions are seen bilaterally.  Cardiomegaly remains stable.  The patient has undergone previous CABG and aortic valve replacement.  IMPRESSION:  1.  Worsening diffuse pulmonary edema/congestive heart failure, superimposed on COPD. 2.  Tiny bilateral pleural effusions. 3.  Stable cardiomegaly.  Original Report Authenticated By: Danae Orleans, M.D.    Assessment/Plan 1)  Progressive renal failure with uremic symptoms, asterixis and volume overload. 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  The patient is elderly, but is bright and still active.  Dialysis vs hospice therapy was offered and pt chooses to try dialysis.  Will place temp cath and start today.  Can taper steroids quickly.   2)  Vol overload with pulm edema 3)  HTN 4)  Hx CABG 5)  Hx tissue AVR 6)  Hx DM  Thanks for the referral, will follow.    [name] D 12/13/2010, 4:46 PM Cell #  [phone]

## 2010-12-13 NOTE — Progress Notes (Signed)
PCP: Tillman Abide, MD, MD  Brief narrative:  [name] is an 77 y.o. female with increased confusion and lethargy. Patient was unable to give a history and her son was at the bedside and gives the history. She was last seen normal on Dec 6th, and the son reports that patient has been eating and drinking well, he states that she has been undergoing a renal workup due to worsening of her kidney numbers over the past few months. He states her creatinine had been 7.6, and she is undergoing a workup by renal, and had a renal biopsy and they are awaiting the results for causes of her renal problems, before the options are to be discussed such as dialysis. She has been seen by Dr. Allena Katz of [facility], and a renal specialist [facility]. Her ED workup revealed a UTI, and a BUN of >140, and a Creatinine of 6.4. The son also denies that she has had any bloody stools or melena. Her Hb= 9.4, and she is on supplemental Iron therapy.    Past medical history: Anemia NOS, Personal history of colonic polyps, Diabetes mellitus type II, GERD (gastroesophageal reflux disease),  Hyperlipidemia, Hypertension, Hypothyroidism, Osteoarthritis, Osteopenia, Urinary incontinence, Pulmonary hypertension, Anxiety, Renal insufficiency, Coronary artery disease, Aortic stenosis.   Past surgical History: Abdominal hysterectomy, Tonsillectomy, Cardiac catheterization 2000 cad, Replacement total knee bilateral 05/1998, .  Cataract extraction, Adenosine myoview 2007 benign, EF 69%, Carotid endarterectomy 2011 Right, Coronary artery bypass graft 2009, Aortic valve replacement 2009, Amputation-left great toe 7/12 Dr August Saucer, Traumatic amputation of right dip joint of index finger   Consultants: Nephrology  Procedures: None  Subjective: Patient denies any complaints. Son reports cough and mild shortness of breath over last few days. Son says she looks much better compared to yesterday.  Objective: Vital signs in last 24  hours: Temp:  [97.3 F (36.3 C)-97.8 F (36.6 C)] 97.5 F (36.4 C) (12/08 0420) Pulse Rate:  [79-95] 95  (12/08 0420) Resp:  [16-20] 18  (12/08 0420) BP: (162-189)/(57-82) 173/73 mmHg (12/08 0420) SpO2:  [63 %-95 %] 63 % (12/08 0420) Weight:  [56.246 kg (124 lb)] 124 lb (56.246 kg) (12/08 0100) Weight change:  Last BM Date: 12/12/10  Intake/Output from previous day:   Intake/Output this shift:    General appearance: alert, cooperative, appears stated age and no distress Head: Normocephalic, without obvious abnormality, atraumatic Eyes: conjunctivae/corneas clear. PERRL, EOM's intact. Fundi benign. Throat: lips, mucosa, and tongue normal; teeth and gums normal Neck: no adenopathy, no carotid bruit, no JVD, supple, symmetrical, trachea midline and thyroid not enlarged, symmetric, no tenderness/mass/nodules Resp: few crackles at bases. No wheezing. Cardio: normal apical impulse and mechanical click heard. GI: soft, non-tender; bowel sounds normal; no masses,  no organomegaly Extremities: edema 2+ b/l LE Pulses: poorly palpable Skin: Skin color, texture, turgor normal. No rashes or lesions Lymph nodes: Cervical, supraclavicular, and axillary nodes normal. Neurologic: Mental status: alertness: alert, orientation: person, place, year, month, affect: normal Cranial nerves: normal Sensory: normal Motor: grossly normal  Lab Results:  Basename 12/13/10 0200 12/12/10 1600 12/12/10 1533  WBC 12.5* -- 12.3*  HGB 9.8* 9.9* --  HCT 28.5* 29.0* --  PLT 65* -- 59*   BMET  Basename 12/13/10 0200 12/12/10 1600  NA 138 137  K 4.4 4.8  CL 99 107  CO2 18* --  GLUCOSE 201* 252*  BUN 140* >140*  CREATININE 5.85* 6.40*  CALCIUM 8.7 --    Studies/Results: Dg Chest 2 View  12/12/2010  *RADIOLOGY REPORT*  Clinical Data: Worsening hypoxia, cough, and confusion. Hypertension and diabetes.  Coronary artery disease.  CHEST - 2 VIEW  Comparison: 11/27/2010  Findings: Worsening diffuse  bilateral pulmonary air space disease is seen since previous study, consistent with pulmonary edema. This is superimposed on COPD.  Tiny pleural effusions are seen bilaterally.  Cardiomegaly remains stable.  The patient has undergone previous CABG and aortic valve replacement.  IMPRESSION:  1.  Worsening diffuse pulmonary edema/congestive heart failure, superimposed on COPD. 2.  Tiny bilateral pleural effusions. 3.  Stable cardiomegaly.  Original Report Authenticated By: Danae Orleans, M.D.    Medications:  Scheduled:    . amLODipine  10 mg Oral Daily  . aspirin  81 mg Oral Daily  . calcium carbonate  1 tablet Oral TID WC  . cefTRIAXone (ROCEPHIN)  IV  1 g Intravenous Once  . cefTRIAXone (ROCEPHIN)  IV  1 g Intravenous Q24H  . cloNIDine  0.2 mg Oral TID  . enoxaparin  40 mg Subcutaneous Q24H  . estradiol  0.5 mg Oral Daily  . ferrous fumarate  1 tablet Oral Daily  . folic acid  0.5 mg Oral Daily  . insulin aspart  0-5 Units Subcutaneous QHS  . insulin aspart  0-9 Units Subcutaneous TID WC  . levothyroxine  37.5-75 mcg Oral Daily  . magic mouthwash  5 mL Oral QID  . metoprolol  75 mg Oral BID  . pantoprazole (PROTONIX) IV  40 mg Intravenous Q24H  . simvastatin  20 mg Oral QHS  . sodium bicarbonate  1,300 mg Oral TID  . DISCONTD: FIRST-DUKES MOUTHWASH  5 mL Mouth/Throat QID    Principal Problem:  *Encephalopathy acute Active Problems:  HYPOTHYROIDISM  DIABETES MELLITUS, TYPE II  ANEMIA-NOS  CORONARY ARTERY DISEASE  Edema  UTI (lower urinary tract infection)  Thrombocytopenia  CKD (chronic kidney disease), stage V   Assessment/Plan: 1. Acute Encephalopathy: Likely secondary to UTI and acute on chronic renal failure and uremia. Improved this AM. Monitor.  2. UTI: On ceftriaxone. Follow urine cultures  3. Acute on CKD V: Await nephrology input. Stop IVF due to concern for pulmonary congestion. ?Need for dialysis.  4. Thrombocytopenia: New finding. Etiology unclear. Check  for schistocytes. DIC panel. Stop aspirin and lovenox for now.Monitor counts daily.  5. Anemia: Sec to CKD. HGB stable. Monitor.  6. DM2: SSI  7. HTN: Stable. Monitor  8. Hypothyroidism  9. H/o CAD and AVR: Stable  10. Pedal edema: Secondary to CKD.  11. DVT prophylaxis: SCD's  12. PT/OT  13. Full Code       LOS: 1 day   [facility] Pager [phone] 12/13/2010, 9:14 AM

## 2010-12-14 LAB — COMPREHENSIVE METABOLIC PANEL
Albumin: 2 g/dL — ABNORMAL LOW (ref 3.5–5.2)
Alkaline Phosphatase: 78 U/L (ref 39–117)
BUN: 80 mg/dL — ABNORMAL HIGH (ref 6–23)
Calcium: 8.1 mg/dL — ABNORMAL LOW (ref 8.4–10.5)
Creatinine, Ser: 4.21 mg/dL — ABNORMAL HIGH (ref 0.50–1.10)
GFR calc Af Amer: 10 mL/min — ABNORMAL LOW (ref >90)
Glucose, Bld: 78 mg/dL (ref 70–99)
Total Protein: 5.8 g/dL — ABNORMAL LOW (ref 6.0–8.3)

## 2010-12-14 LAB — PREPARE RBC (CROSSMATCH)

## 2010-12-14 LAB — CBC
HCT: 22.9 % — ABNORMAL LOW (ref 36.0–46.0)
Hemoglobin: 7.7 g/dL — ABNORMAL LOW (ref 12.0–15.0)
MCH: 28.7 pg (ref 26.0–34.0)
MCHC: 33.6 g/dL (ref 30.0–36.0)
MCV: 85.4 fL (ref 78.0–100.0)
RDW: 14.3 % (ref 11.5–15.5)

## 2010-12-14 LAB — HEPATITIS B CORE ANTIBODY, TOTAL: Hep B Core Total Ab: NEGATIVE

## 2010-12-14 LAB — GLUCOSE, CAPILLARY
Glucose-Capillary: 46 mg/dL — ABNORMAL LOW (ref 70–99)
Glucose-Capillary: 70 mg/dL (ref 70–99)

## 2010-12-14 MED ORDER — HEPARIN SODIUM (PORCINE) 1000 UNIT/ML DIALYSIS
20.0000 [IU]/kg | INTRAMUSCULAR | Status: DC | PRN
  Administered 2010-12-25: 1100 [IU] via INTRAVENOUS_CENTRAL
  Filled 2010-12-14: qty 2

## 2010-12-14 MED ORDER — WHITE PETROLATUM GEL
Status: AC
  Administered 2010-12-14: 13:00:00
  Filled 2010-12-14: qty 5

## 2010-12-14 MED ORDER — DEXTROSE 50 % IV SOLN
INTRAVENOUS | Status: AC
  Administered 2010-12-14: 50 mL via INTRAVENOUS
  Filled 2010-12-14: qty 50

## 2010-12-14 NOTE — Progress Notes (Signed)
Subjective: Feels better after 1st HD.  2.5 kg removed.  No n/v or SOB today.  Objective Vital signs in last 24 hours: Filed Vitals:   12/13/10 2200 12/13/10 2211 12/13/10 2300 12/14/10 0410  BP: 98/34 161/71 158/67 174/62  Pulse: 73 103 83 78  Temp:  97.8 F (36.6 C) 98.3 F (36.8 C) 98.2 F (36.8 C)  TempSrc:  Oral Oral Oral  Resp: 24 22 20 18   Height:      Weight:  55.6 kg (122 lb 9.2 oz)    SpO2:  92% 94% 91%   Weight change: 1.654 kg (3 lb 10.3 oz)  Intake/Output Summary (Last 24 hours) at 12/14/10 1001 Last data filed at 12/13/10 2211  Gross per 24 hour  Intake    360 ml  Output   2619 ml  Net  -2259 ml   Labs: Basic Metabolic Panel:  Lab 12/14/10 4098 12/13/10 0200 12/12/10 1600  NA 139 138 137  K 3.8 4.4 4.8  CL 102 99 107  CO2 25 18* --  GLUCOSE 78 201* 252*  BUN 80* 140* >140*  CREATININE 4.21* 5.85* 6.40*  ALB -- -- --  CALCIUM 8.1* 8.7 --  PHOS -- -- --   Liver Function Tests:  Lab 12/14/10 0650 12/13/10 1945 12/13/10 1528  AST 13 -- 12  ALT 20 24 24   ALKPHOS 78 -- 82  BILITOT 0.5 -- 0.4  PROT 5.8* -- 6.1  ALBUMIN 2.0* -- 2.2*   No results found for this basename: LIPASE:3,AMYLASE:3 in the last 168 hours No results found for this basename: AMMONIA:3 in the last 168 hours CBC:  Lab 12/14/10 0650 12/13/10 0919 12/13/10 0200 12/12/10 1600 12/12/10 1533  WBC 6.7 -- 12.5* -- 12.3*  NEUTROABS -- -- -- -- 12.0*  HGB 7.7* -- 9.8* 9.9* 9.4*  HCT 22.9* -- 28.5* 29.0* 28.1*  MCV 85.4 -- 84.6 -- 85.4  PLT 54* 63* 65* -- 59*   PT/INR: @labrcntip (inr:5) Cardiac Enzymes: No results found for this basename: CKTOTAL:5,CKMB:5,CKMBINDEX:5,TROPONINI:5 in the last 168 hours CBG:  Lab 12/14/10 0741 12/14/10 0407 12/14/10 0323 12/13/10 1652 12/13/10 1214  GLUCAP 70 144* 46* 94 211*    Iron Studies:  Lab 12/13/10 1528  IRON 41*  TIBC 157*  TRANSFERRIN --  FERRITIN 1328*   Studies/Results: Dg Chest 2 View  12/12/2010  *RADIOLOGY REPORT*  Clinical  Data: Worsening hypoxia, cough, and confusion. Hypertension and diabetes.  Coronary artery disease.  CHEST - 2 VIEW  Comparison: 11/27/2010  Findings: Worsening diffuse bilateral pulmonary air space disease is seen since previous study, consistent with pulmonary edema. This is superimposed on COPD.  Tiny pleural effusions are seen bilaterally.  Cardiomegaly remains stable.  The patient has undergone previous CABG and aortic valve replacement.  IMPRESSION:  1.  Worsening diffuse pulmonary edema/congestive heart failure, superimposed on COPD. 2.  Tiny bilateral pleural effusions. 3.  Stable cardiomegaly.  Original Report Authenticated By: Danae Orleans, M.[name].   Medications:      . amLODipine  10 mg Oral Daily  . calcium carbonate  1 tablet Oral TID WC  . cefTRIAXone (ROCEPHIN)  IV  1 g Intravenous Q24H  . cloNIDine  0.2 mg Oral TID  . dextrose      . estradiol  0.5 mg Oral Daily  . ferrous fumarate  1 tablet Oral Daily  . folic acid  0.5 mg Oral Daily  . insulin aspart  0-5 Units Subcutaneous QHS  . insulin aspart  0-9 Units  Subcutaneous TID WC  . levothyroxine  37.5 mcg Oral Custom  . levothyroxine  75 mcg Oral Custom  . magic mouthwash  5 mL Oral QID  . metoprolol  75 mg Oral BID  . pantoprazole (PROTONIX) IV  40 mg Intravenous Q24H  . simvastatin  20 mg Oral QHS  . sodium bicarbonate  1,300 mg Oral TID  . DISCONTD: levothyroxine  37.5-75 mcg Oral Daily    I  have reviewed scheduled and prn medications.  Physical Exam: General: alert, + JVD Heart: reg, no rub Lungs: bibasilar crackles Abdomen: soft  Extremities: 2+ pitting edema all 4 ext Dialysis Access: temp cath R groin Neuro: asterixis is better today  Problem/Plan: 1. 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  1st HD last night.  Significant volume overload.  Will dialyze Mon am then again Tuesday then can go to three times a week.  Will consult [facility] for TDC and perm access, mid week ideally. 2. Pulm edema - a little  better 3. Anemia - will transfuse with next HD. Hb 7's.  4. Secondary HPTh -  Check phos, PTH 5. Hx CABG and tissue AVR  [name]  12/14/2010,10:01 AM  LOS: 2 days  Cell #  [phone]

## 2010-12-14 NOTE — Progress Notes (Addendum)
PCP: Tillman Abide, MD, MD  Brief narrative:  [name] is an 77 y.o. female with increased confusion and lethargy. Patient was unable to give a history and her son was at the bedside and gives the history. She was last seen normal on Dec 6th, and the son reports that patient has been eating and drinking well, he states that she has been undergoing a renal workup due to worsening of her kidney numbers over the past few months. He states her creatinine had been 7.6, and she is undergoing a workup by renal, and had a renal biopsy and they are awaiting the results for causes of her renal problems, before the options are to be discussed such as dialysis. She has been seen by Dr. Allena Katz of [facility], and a renal specialist [facility]. Her ED workup revealed a UTI, and a BUN of >140, and a Creatinine of 6.4. The son also denies that she has had any bloody stools or melena. Her Hb= 9.4, and she is on supplemental Iron therapy.   Past medical history: Anemia NOS, Personal history of colonic polyps, Diabetes mellitus type II, GERD (gastroesophageal reflux disease),  Hyperlipidemia, Hypertension, Hypothyroidism, Osteoarthritis, Osteopenia, Urinary incontinence, Pulmonary hypertension, Anxiety, Renal insufficiency, Coronary artery disease, Aortic stenosis.   Past surgical History: Abdominal hysterectomy, Tonsillectomy, Cardiac catheterization 2000 cad, Replacement total knee bilateral 05/1998, .  Cataract extraction, Adenosine myoview 2007 benign, EF 69%, Carotid endarterectomy 2011 Right, Coronary artery bypass graft 2009, Aortic valve replacement 2009, Amputation-left great toe 7/12 Dr August Saucer, Traumatic amputation of right dip joint of index finger   Consultants: Nephrology  Procedures: None  Subjective: Patient denies any complaints. Feels better. No SOB or CP. Swelling is better. Son at bedside.  Objective: Vital signs in last 24 hours: Temp:  [97.6 F (36.4 C)-99.2 F (37.3 C)] 99.2 F  (37.3 C) (12/09 0900) Pulse Rate:  [73-103] 89  (12/09 0900) Resp:  [18-28] 18  (12/09 0900) BP: (98-186)/(34-79) 186/76 mmHg (12/09 0900) SpO2:  [91 %-94 %] 93 % (12/09 0900) Weight:  [55.6 kg (122 lb 9.2 oz)-57.9 kg (127 lb 10.3 oz)] 122 lb 9.2 oz (55.6 kg) (12/08 2211) Weight change: 1.654 kg (3 lb 10.3 oz) Last BM Date: 12/12/10  Intake/Output from previous day: 12/08 0701 - 12/09 0700 In: 480 [P.O.:480] Out: 2919 [Urine:425] Intake/Output this shift: Total I/O In: 240 [P.O.:240] Out: 450 [Urine:450]  General appearance: alert, cooperative, appears stated age and no distress Head: Normocephalic, without obvious abnormality, atraumatic Eyes: conjunctivae/corneas clear. PERRL, EOM's intact. Fundi benign. Throat: Sores noted in mouth and at angle of mouth. Resp: few crackles at bases. No wheezing. Cardio: normal apical impulse and mechanical click heard. GI: soft, non-tender; bowel sounds normal; no masses,  no organomegaly Extremities: edema 1+ b/l LE. Looks better today. Dialysis catheter in right groin area. No hematoma seen. Pulses: poorly palpable Neurologic: Mental status: alertness: alert, orientation: person, place, year, month, affect: normal Cranial nerves: normal Sensory: normal Motor: grossly normal  Lab Results:  Basename 12/14/10 0650 12/13/10 0919 12/13/10 0200  WBC 6.7 -- 12.5*  HGB 7.7* -- 9.8*  HCT 22.9* -- 28.5*  PLT 54* 63* --   BMET  Basename 12/14/10 0650 12/13/10 0200  NA 139 138  K 3.8 4.4  CL 102 99  CO2 25 18*  GLUCOSE 78 201*  BUN 80* 140*  CREATININE 4.21* 5.85*  CALCIUM 8.1* 8.7    Studies/Results: Dg Chest 2 View  12/12/2010  *RADIOLOGY REPORT*  Clinical  Data: Worsening hypoxia, cough, and confusion. Hypertension and diabetes.  Coronary artery disease.  CHEST - 2 VIEW  Comparison: 11/27/2010  Findings: Worsening diffuse bilateral pulmonary air space disease is seen since previous study, consistent with pulmonary edema. This is  superimposed on COPD.  Tiny pleural effusions are seen bilaterally.  Cardiomegaly remains stable.  The patient has undergone previous CABG and aortic valve replacement.  IMPRESSION:  1.  Worsening diffuse pulmonary edema/congestive heart failure, superimposed on COPD. 2.  Tiny bilateral pleural effusions. 3.  Stable cardiomegaly.  Original Report Authenticated By: Danae Orleans, M.D.    Medications:  Scheduled:    . amLODipine  10 mg Oral Daily  . calcium carbonate  1 tablet Oral TID WC  . cefTRIAXone (ROCEPHIN)  IV  1 g Intravenous Q24H  . cloNIDine  0.2 mg Oral TID  . dextrose      . estradiol  0.5 mg Oral Daily  . ferrous fumarate  1 tablet Oral Daily  . folic acid  0.5 mg Oral Daily  . insulin aspart  0-5 Units Subcutaneous QHS  . insulin aspart  0-9 Units Subcutaneous TID WC  . levothyroxine  37.5 mcg Oral Custom  . levothyroxine  75 mcg Oral Custom  . magic mouthwash  5 mL Oral QID  . metoprolol  75 mg Oral BID  . pantoprazole (PROTONIX) IV  40 mg Intravenous Q24H  . simvastatin  20 mg Oral QHS  . sodium bicarbonate  1,300 mg Oral TID    Principal Problem:  *Encephalopathy acute Active Problems:  HYPOTHYROIDISM  DIABETES MELLITUS, TYPE II  ANEMIA-NOS  CORONARY ARTERY DISEASE  Edema  UTI (lower urinary tract infection)  Thrombocytopenia  CKD (chronic kidney disease), stage V   Assessment/Plan: 1. Acute Encephalopathy: Improved. Probably back to baseline. Likely secondary to UTI and acute on chronic renal failure and uremia.   2. UTI: On ceftriaxone. Follow urine cultures.  3. Acute on CKD V: Now with ESRD. Patient dialysed yesterday after catheter was placed. Permanent access mid-week.  4. Thrombocytopenia: New finding. Etiology unclear. ?Secondary to Uremia. No schistocytes seen. DIC panel reviewed. Monitor counts daily. May need hematology input if no improvement soon.  5. Anemia: Sec to CKD. HGB dropped some. No overt bleeding noted. Plan is to transfuse with  HD.  6. DM2: SSI  7. HTN: Stable. Monitor  8. Hypothyroidism  9. H/o CAD and AVR: Stable  10. Pedal and pulmonary edema: Secondary to CKD. Improving.  11. DVT prophylaxis: SCD's  12. PT/OT  13. Full Code  Code status discussed with patient and Son: She will be DNR.   LOS: 2 days   [facility] Pager [phone] 12/14/2010, 11:11 AM

## 2010-12-15 ENCOUNTER — Inpatient Hospital Stay (HOSPITAL_COMMUNITY): Payer: Medicare Other

## 2010-12-15 DIAGNOSIS — Z09 Encounter for follow-up examination after completed treatment for conditions other than malignant neoplasm: Secondary | ICD-10-CM

## 2010-12-15 DIAGNOSIS — N186 End stage renal disease: Secondary | ICD-10-CM

## 2010-12-15 LAB — RENAL FUNCTION PANEL
BUN: 82 mg/dL — ABNORMAL HIGH (ref 6–23)
Calcium: 7.5 mg/dL — ABNORMAL LOW (ref 8.4–10.5)
Creatinine, Ser: 4.71 mg/dL — ABNORMAL HIGH (ref 0.50–1.10)
Glucose, Bld: 82 mg/dL (ref 70–99)
Phosphorus: 3.9 mg/dL (ref 2.3–4.6)

## 2010-12-15 LAB — CBC
HCT: 23.8 % — ABNORMAL LOW (ref 36.0–46.0)
MCH: 28.8 pg (ref 26.0–34.0)
MCHC: 33.2 g/dL (ref 30.0–36.0)
MCV: 86.9 fL (ref 78.0–100.0)
RDW: 14.5 % (ref 11.5–15.5)
WBC: 7.4 10*3/uL (ref 4.0–10.5)

## 2010-12-15 LAB — URINE CULTURE
Colony Count: >=100000
Culture  Setup Time: 201212081122

## 2010-12-15 LAB — GLUCOSE, CAPILLARY
Glucose-Capillary: 89 mg/dL (ref 70–99)
Glucose-Capillary: 175 mg/dL — ABNORMAL HIGH (ref 70–99)
Glucose-Capillary: 187 mg/dL — ABNORMAL HIGH (ref 70–99)

## 2010-12-15 LAB — PARATHYROID HORMONE, INTACT (NO CA): PTH: 340.6 pg/mL — ABNORMAL HIGH (ref 14.0–72.0)

## 2010-12-15 MED ORDER — VANCOMYCIN HCL IN DEXTROSE 1-5 GM/200ML-% IV SOLN
1000.0000 mg | Freq: Once | INTRAVENOUS | Status: AC
  Administered 2010-12-15: 1000 mg via INTRAVENOUS
  Filled 2010-12-15 (×2): qty 200

## 2010-12-15 MED ORDER — PANTOPRAZOLE SODIUM 40 MG PO TBEC
40.0000 mg | DELAYED_RELEASE_TABLET | Freq: Every day | ORAL | Status: DC
  Administered 2010-12-16 – 2010-12-19 (×4): 40 mg via ORAL
  Filled 2010-12-15 (×5): qty 1

## 2010-12-15 MED ORDER — RENA-VITE PO TABS
1.0000 | ORAL_TABLET | Freq: Every day | ORAL | Status: DC
  Administered 2010-12-15 – 2010-12-28 (×14): 1 via ORAL
  Filled 2010-12-15 (×15): qty 1

## 2010-12-15 MED ORDER — POLYETHYLENE GLYCOL 3350 17 G PO PACK
17.0000 g | PACK | Freq: Every day | ORAL | Status: DC
  Administered 2010-12-15 – 2010-12-29 (×11): 17 g via ORAL
  Filled 2010-12-15 (×15): qty 1

## 2010-12-15 MED ORDER — DARBEPOETIN ALFA-POLYSORBATE 100 MCG/0.5ML IJ SOLN
INTRAMUSCULAR | Status: AC
  Administered 2010-12-15: 100 ug via INTRAVENOUS
  Filled 2010-12-15: qty 0.5

## 2010-12-15 MED ORDER — PRO-STAT SUGAR FREE PO LIQD
30.0000 mL | Freq: Two times a day (BID) | ORAL | Status: DC
  Administered 2010-12-15 – 2010-12-28 (×17): 30 mL via ORAL
  Filled 2010-12-15 (×29): qty 30

## 2010-12-15 MED ORDER — DARBEPOETIN ALFA-POLYSORBATE 100 MCG/0.5ML IJ SOLN
100.0000 ug | INTRAMUSCULAR | Status: DC
  Administered 2010-12-15: 100 ug via INTRAVENOUS

## 2010-12-15 NOTE — Progress Notes (Signed)
PCP: Tillman Abide, MD, MD  Brief narrative:  [name] is an 77 y.o. female with increased confusion and lethargy. Patient was unable to give a history and her son was at the bedside and gives the history. She was last seen normal on Dec 6th, and the son reports that patient has been eating and drinking well, he states that she has been undergoing a renal workup due to worsening of her kidney numbers over the past few months. He states her creatinine had been 7.6, and she is undergoing a workup by renal, and had a renal biopsy and they are awaiting the results for causes of her renal problems, before the options are to be discussed such as dialysis. She has been seen by Dr. Allena Katz of [facility], and a renal specialist [facility]. Her ED workup revealed a UTI, and a BUN of >140, and a Creatinine of 6.4. The son also denies that she has had any bloody stools or melena. Her Hb= 9.4, and she is on supplemental Iron therapy.   Past medical history: Anemia NOS, Personal history of colonic polyps, Diabetes mellitus type II, GERD (gastroesophageal reflux disease),  Hyperlipidemia, Hypertension, Hypothyroidism, Osteoarthritis, Osteopenia, Urinary incontinence, Pulmonary hypertension, Anxiety, Renal insufficiency, Coronary artery disease, Aortic stenosis.   Past surgical History: Abdominal hysterectomy, Tonsillectomy, Cardiac catheterization 2000 cad, Replacement total knee bilateral 05/1998, .  Cataract extraction, Adenosine myoview 2007 benign, EF 69%, Carotid endarterectomy 2011 Right, Coronary artery bypass graft 2009, Aortic valve replacement 2009, Amputation-left great toe 7/12 Dr August Saucer, Traumatic amputation of right dip joint of index finger   Consultants: Nephrology  Procedures: None  Subjective: Patient denies any complaints. Feels same. No SOB or CP. Swelling is better.   Objective: Vital signs in last 24 hours: Temp:  [98.1 F (36.7 C)-99.9 F (37.7 C)] 98.1 F (36.7 C) (12/10  1417) Pulse Rate:  [76-94] 76  (12/10 1417) Resp:  [16-33] 16  (12/10 1417) BP: (129-176)/(61-89) 140/61 mmHg (12/10 1417) SpO2:  [86 %-95 %] 92 % (12/10 1417) Weight:  [53.1 kg (117 lb 1 oz)-56.5 kg (124 lb 9 oz)] 117 lb 1 oz (53.1 kg) (12/10 0805) Weight change: -3.5 kg (-7 lb 11.5 oz) Last BM Date: 12/12/10  Intake/Output from previous day: 12/09 0701 - 12/10 0700 In: 940 [P.O.:840; I.V.:40; IV Piggyback:60] Out: 750 [Urine:700; Stool:50] Intake/Output this shift: Total I/O In: 240 [P.O.:240] Out: 3510 [Urine:175; Other:3335]  General appearance: alert, cooperative, appears stated age and no distress Head: Normocephalic, without obvious abnormality, atraumatic Eyes: conjunctivae/corneas clear. PERRL, EOM's intact. Throat: Sores noted in mouth and at angle of mouth. Resp: few crackles at bases. No wheezing. Cardio: normal apical impulse and mechanical click heard. GI: soft, non-tender; bowel sounds normal; no masses,  no organomegaly Extremities: edema 1+ b/l LE. Looks better today. Dialysis catheter in right groin area. No hematoma seen. Pulses: poorly palpable Neurologic: Mental status: alertness: alert, orientation: person, place, year, month, affect: normal Cranial nerves: normal Sensory: normal Motor: grossly normal  Lab Results:  Basename 12/15/10 0500 12/14/10 0650  WBC 7.4 6.7  HGB 7.9* 7.7*  HCT 23.8* 22.9*  PLT 56* 54*   BMET  Basename 12/15/10 0500 12/14/10 0650  NA 139 139  K 3.7 3.8  CL 100 102  CO2 25 25  GLUCOSE 82 78  BUN 82* 80*  CREATININE 4.71* 4.21*  CALCIUM 7.5* 8.1*    Studies/Results: No results found.  Medications:  Scheduled:    . amLODipine  10 mg Oral  Daily  . calcium carbonate  1 tablet Oral TID WC  . cefTRIAXone (ROCEPHIN)  IV  1 g Intravenous Q24H  . cloNIDine  0.2 mg Oral TID  . darbepoetin (ARANESP) injection - DIALYSIS  100 mcg Intravenous Q Mon-HD  . estradiol  0.5 mg Oral Daily  . feeding supplement  30 mL Oral BID   . ferrous fumarate  1 tablet Oral Daily  . folic acid  0.5 mg Oral Daily  . insulin aspart  0-5 Units Subcutaneous QHS  . insulin aspart  0-9 Units Subcutaneous TID WC  . levothyroxine  37.5 mcg Oral Custom  . levothyroxine  75 mcg Oral Custom  . magic mouthwash  5 mL Oral QID  . metoprolol  75 mg Oral BID  . multivitamin  1 tablet Oral QHS  . pantoprazole  40 mg Oral Q1200  . simvastatin  20 mg Oral QHS  . DISCONTD: pantoprazole (PROTONIX) IV  40 mg Intravenous Q24H  . DISCONTD: sodium bicarbonate  1,300 mg Oral TID    Principal Problem:  *Encephalopathy acute Active Problems:  HYPOTHYROIDISM  DIABETES MELLITUS, TYPE II  ANEMIA-NOS  CORONARY ARTERY DISEASE  Edema  UTI (lower urinary tract infection)  Thrombocytopenia  CKD (chronic kidney disease), stage V   Assessment/Plan: 1. Acute Encephalopathy: Improved. Probably back to baseline. Likely secondary to UTI and acute on chronic renal failure and uremia.   2. UTI: VRE in urine. Start Vanc. Stop ceftriaxone.  3. Acute on CKD V: Now with ESRD. Patient dialysed today. Has access in right groin. Permanent access mid-week.  4. Thrombocytopenia: New finding. Etiology unclear. ?Secondary to Uremia. No schistocytes seen. DIC panel reviewed. May need hematology input if no improvement soon. Counts stable. Continue to monitor for now.  5. Anemia: Sec to CKD. HGB dropped some. No overt bleeding noted. Plan is to transfuse with HD. Anemia panel reviewed.  6. DM2: SSI  7. HTN: Stable. Monitor  8. Hypothyroidism  9. H/o CAD and AVR: Stable  10. Pedal and pulmonary edema: Secondary to CKD. Improving.  11. DVT prophylaxis: SCD's  12. PT/OT  13. DNR  14. Mouth sores: Magic mouthwash  15. Constipation: Miralax   LOS: 3 days   [facility] Pager [phone] 12/15/2010, 4:11 PM

## 2010-12-15 NOTE — Consult Note (Signed)
[facility]       Consult Note  Patient name: [name] MRN: [id number] DOB: 28-Jul-1920 Sex: female  Consulting Physician:  Renal  Reason for Consult:  Chief Complaint  Patient presents with  . Fatigue  . Altered Mental Status    HISTORY OF PRESENT ILLNESS: I am asked by the renal service to see this patient for a new start dialysis access.  She was admitted with progressive renal failure likely secondary to dehydration.  She had uremic symptoms with volume overload.  A temporary catheter was placed for immediate dialysis.  The patient was contemplating dialysis vs hospice, but has decided to proceed with dialysis.  She is right handed.  She is medically managed for her diabetes and hypertension.  She suffers from CAD and is  S/p CABG  Past Medical History  Diagnosis Date  . Anemia     NOS  . Personal history of colonic polyps   . Diabetes mellitus     type II  . GERD (gastroesophageal reflux disease)   . Hyperlipidemia   . Hypertension   . Hypothyroidism   . Osteoarthritis   . Osteopenia   . Urinary incontinence   . Pulmonary hypertension   . Anxiety   . Renal insufficiency   . Coronary artery disease   . Aortic stenosis     Past Surgical History  Procedure Date  . Abdominal hysterectomy   . Tonsillectomy   . Cardiac catheterization 2000    cad  . Replacement total knee bilateral 05/1998  . Cataract extraction   . Coronary artery bypass graft     od  . Adenosine myoview 2007    benign, EF 69%  . Carotid endarterectomy 2011    Right  . Coronary artery bypass graft 2009  . Aortic valve replacement 2009  . Amputation-left great toe 7/12    Dr August Saucer  . Traumatic amputation of right dip joint of index finger     History   Social History  . Marital Status: Widowed    Spouse Name: [name]    Number of Children: 1  . Years of Education: [name]   Occupational History  . retired Neurosurgeon [employer]    Social History  Main Topics  . Smoking status: Former Smoker -- 15 years    Types: Cigarettes  . Smokeless tobacco: Never Used  . Alcohol Use: Yes     Wine occasionally  . Drug Use: No  . Sexually Active: No   Other Topics Concern  . Not on file   Social History Narrative   Retired - [employer], accts receivable    History reviewed. No pertinent family history.  Allergies as of 12/12/2010 - Review Complete 12/12/2010  Allergen Reaction Noted  . Captopril  02/01/2006  . Enalapril maleate  02/01/2006  . Nitrofurantoin  02/01/2006  . Ramipril  02/01/2006  . Sulfa antibiotics Other (See Comments) 11/21/2010  . Verapamil  02/01/2006    No current facility-administered medications on file prior to encounter.   Current Outpatient Prescriptions on File Prior to Encounter  Medication Sig Dispense Refill  . acetaminophen (TYLENOL) 500 MG tablet Take 500 mg by mouth every 6 (six) hours as needed. For pain       . ALPRAZolam (XANAX) 0.5 MG tablet Take 0.25 mg by mouth at bedtime as needed. For anxiety      . amLODipine (NORVASC) 10 MG tablet Take 1 tablet (10 mg total) by mouth daily.  30 tablet  11  . Ascorbic Acid (VITAMIN C) 500 MG CHEW Chew 1 tablet by mouth daily.        Marland Kitchen aspirin 81 MG tablet Take 81 mg by mouth daily.        . calcium carbonate (OS-CAL - DOSED IN MG OF ELEMENTAL CALCIUM) 1250 MG tablet Take 1 tablet (500 mg of elemental calcium total) by mouth 3 (three) times daily with meals.  90 tablet  0  . cloNIDine (CATAPRES) 0.2 MG tablet Take 0.2 mg by mouth 3 (three) times daily.       . Diphenhyd-Hydrocort-Nystatin (FIRST-DUKES MOUTHWASH) SUSP Use as directed 5 mLs in the mouth or throat 4 (four) times daily as needed.  237 mL  1  . estradiol (ESTRACE) 0.5 MG tablet Take 0.5 mg by mouth daily.        . ferrous fumarate (HEMOCYTE - 106 MG FE) 325 (106 FE) MG TABS Take 1 tablet by mouth daily.        . folic acid (FOLVITE) 0.5 MG tablet Take 0.5 tablets (0.5 mg total) by mouth daily.  30  tablet  0  . glipiZIDE (GLUCOTROL) 5 MG tablet Take 0.5 tablets (2.5 mg total) by mouth daily.  30 tablet  5  . levothyroxine (SYNTHROID, LEVOTHROID) 75 MCG tablet Take 37.5-75 mcg by mouth daily. Take 37.5 mg on tues and thurs, 75 mg all other days      . metoprolol (LOPRESSOR) 50 MG tablet Take 1.5 tablets (75 mg total) by mouth 2 (two) times daily.  90 tablet  11  . simvastatin (ZOCOR) 20 MG tablet Take 1 tablet (20 mg total) by mouth at bedtime.  30 tablet  11  . sodium bicarbonate 650 MG tablet Take 2 tablets (1,300 mg total) by mouth 3 (three) times daily.  90 tablet  1     REVIEW OF SYSTEMS: SOB improved, edema is less.  See H&P for full ROS (11/17)  PHYSICAL EXAMINATION: General: The patient appears their stated age.  Vital signs are BP 170/72  Pulse 89  Temp(Src) 102.2 F (39 C) (Oral)  Resp 20  Ht 5\' 3"  (1.6 m)  Wt 117 lb 11.6 oz (53.4 kg)  BMI 20.85 kg/m2  SpO2 91% Pulmonary: Respirations are non-labored HEENT:  No gross abnormalities Abdomen: Soft and non-tender, obese Musculoskeletal: There are no major deformities.   Neurologic: No focal weakness or paresthesias are detected, Skin: Very thin skin  Psychiatric: The patient has normal affect. Cardiovascular: palpable left brachial pulse  Diagnostic Studies: Vein map ordered    Assessment:  ESRD, needing access Plan: I will schedule her for a catheter later this week, likely Wednesday.  With all of the edema in her extremities, this is probably not the time to place access in her arm, as I think it will have a high risk of wound complications.  Permanent acces can be established once a majority of her edema has resolved. I have ordered vein mapping to see if she is a candidate for a AVF vs AVGG. This was discussed with the patient and son at the bedside. Dr. Imogene Burn will be assuming this patients care in my absence this week.     Jorge Ny, M.D. [facility] [place] Office:  [phone] Pager:  [phone]

## 2010-12-15 NOTE — Progress Notes (Signed)
INITIAL ADULT NUTRITION ASSESSMENT Date: 12/15/2010   Time: 11:44 AM  Reason for Assessment: consult for education and poor PO intake  ASSESSMENT: Female 77 y.o.  Dx: Encephalopathy acute  Hx:  Past Medical History  Diagnosis Date  . Anemia     NOS  . Personal history of colonic polyps   . Diabetes mellitus     type II  . GERD (gastroesophageal reflux disease)   . Hyperlipidemia   . Hypertension   . Hypothyroidism   . Osteoarthritis   . Osteopenia   . Urinary incontinence   . Pulmonary hypertension   . Anxiety   . Renal insufficiency   . Coronary artery disease   . Aortic stenosis    Related Meds:     . amLODipine  10 mg Oral Daily  . calcium carbonate  1 tablet Oral TID WC  . cefTRIAXone (ROCEPHIN)  IV  1 g Intravenous Q24H  . cloNIDine  0.2 mg Oral TID  . darbepoetin (ARANESP) injection - DIALYSIS  100 mcg Intravenous Q Mon-HD  . estradiol  0.5 mg Oral Daily  . ferrous fumarate  1 tablet Oral Daily  . folic acid  0.5 mg Oral Daily  . insulin aspart  0-5 Units Subcutaneous QHS  . insulin aspart  0-9 Units Subcutaneous TID WC  . levothyroxine  37.5 mcg Oral Custom  . levothyroxine  75 mcg Oral Custom  . magic mouthwash  5 mL Oral QID  . metoprolol  75 mg Oral BID  . multivitamin  1 tablet Oral QHS  . pantoprazole  40 mg Oral Q1200  . simvastatin  20 mg Oral QHS  . white petrolatum      . DISCONTD: pantoprazole (PROTONIX) IV  40 mg Intravenous Q24H  . DISCONTD: sodium bicarbonate  1,300 mg Oral TID   Ht: 5\' 3"  (160 cm)  Wt: 117 lb 1 oz (53.1 kg) post-HD 12/10  Ideal Wt: 52.3 kg % Ideal Wt: 102%  Usual Wt: 51 kg 09/29/10 (office visit record) % Usual Wt: 104%  Body mass index is 20.74 kg/(m^2). Weight is WNL.  Food/Nutrition Related Hx: n/a  Labs:  CMP     Component Value Date/Time   NA 139 12/15/2010 0500   K 3.7 12/15/2010 0500   CL 100 12/15/2010 0500   CO2 25 12/15/2010 0500   GLUCOSE 82 12/15/2010 0500   GLUCOSE 144* 01/13/2006 0000   BUN 82* 12/15/2010 0500   CREATININE 4.71* 12/15/2010 0500   CALCIUM 7.5* 12/15/2010 0500   PROT 5.8* 12/14/2010 0650   ALBUMIN 1.9* 12/15/2010 0500   AST 13 12/14/2010 0650   ALT 20 12/14/2010 0650   ALKPHOS 78 12/14/2010 0650   BILITOT 0.5 12/14/2010 0650   GFRNONAA 7* 12/15/2010 0500   GFRAA 9* 12/15/2010 0500  Phosphorus 3.9  Intake/Output I/O last 3 completed shifts: In: 940 [P.O.:840; I.V.:40; IV Piggyback:60] Out: 3244 [Urine:700; ZOXWR:6045; Stool:50] Total I/O In: 240 [P.O.:240] Out: 3510 [Urine:175; Other:3335]   Diet Order: Renal 60/70  Supplements/Tube Feeding: n/a  IVF:     Estimated Nutritional Needs:   Kcal:  1600 - 1800 Protein:  60 - 70 grams Fluid:  1.2 L/d  Pt was followed by RD staff during recent hospitalization. Pt with hx of unintentional weight loss. Pt with progressive renal failure with uremic symptoms at admission. Pt chose to try dialysis vs hospice therapy, per MD note. Pt reports that she is eating "fair." Discussed PO intake with pt and family at bedside, also educated  on renal diet per RN request.  NUTRITION DIAGNOSIS: 1. Food and nutrition related knowledge deficit r/t lack of prior HD education AEB pt and staff's identification of need for HD. 2. Increased nutrient needs r/t HD AEB estimated needs.  MONITORING/EVALUATION(Goals): Goal:  1. Verbalize understanding of renal diet. Met. 2. Pt to consume on average >75% of meals and supplements. Unmet.  EDUCATION NEEDS: -Education needs addressed  INTERVENTION: 1. Provided renal diet education to family at bedside and patient. Provided Choose-A-Meal Booklet and Renal Food Pyramid. Pt and family asked appropriate questions. 2. 30 ml Prostat PO BID to provide additional protein and kcal.  3. RD to follow nutrition care plan  Dietitian #: [id number]  DOCUMENTATION CODES Per approved criteria  -Not Applicable    [name] 12/15/2010, 11:44 AM

## 2010-12-15 NOTE — Progress Notes (Signed)
PT/OT/SLP Cancellation Note  Treatment cancelled today due to patient receiving procedure or test: Pt in hemodialysis in AM.  Later in AM pt declined to participate due to fatigue from dialysis.   Requested PT return after lunch.  Upon return in afternoon pt was visiting with her pastor and requested PT to return.    [name] L. Neyda Durango DPT [phone] 12/15/2010

## 2010-12-15 NOTE — Progress Notes (Signed)
Report given to Artemio Aly, RN.

## 2010-12-15 NOTE — Progress Notes (Signed)
OT Cancellation Note  Treatment cancelled today due to pt in dialysis.  Will check on pt next day.  Lise Auer, OT

## 2010-12-15 NOTE — Progress Notes (Signed)
S:pt seen on HD.  Eating some.  no NV  constipated O:BP 132/76  Pulse 94  Temp(Src) 98.2 F (36.8 C) (Oral)  Resp 29  Ht 5\' 3"  (1.6 m)  Wt 56.5 kg (124 lb 9 oz)  BMI 22.06 kg/m2  SpO2 92%  Intake/Output Summary (Last 24 hours) at 12/15/10 0747 Last data filed at 12/15/10 0501  Gross per 24 hour  Intake    940 ml  Output    750 ml  Net    190 ml   Weight change: -3.5 kg (-7 lb 11.5 oz) UJW:JXBJY and alert CVS:RRR Resp:bil crackles, decreased BS bases Abd:Pos BS NTND Ext:1-2+ edema NEURO:ox3 no asterixis      . amLODipine  10 mg Oral Daily  . calcium carbonate  1 tablet Oral TID WC  . cefTRIAXone (ROCEPHIN)  IV  1 g Intravenous Q24H  . cloNIDine  0.2 mg Oral TID  . estradiol  0.5 mg Oral Daily  . ferrous fumarate  1 tablet Oral Daily  . folic acid  0.5 mg Oral Daily  . insulin aspart  0-5 Units Subcutaneous QHS  . insulin aspart  0-9 Units Subcutaneous TID WC  . levothyroxine  37.5 mcg Oral Custom  . levothyroxine  75 mcg Oral Custom  . magic mouthwash  5 mL Oral QID  . metoprolol  75 mg Oral BID  . pantoprazole (PROTONIX) IV  40 mg Intravenous Q24H  . simvastatin  20 mg Oral QHS  . sodium bicarbonate  1,300 mg Oral TID  . white petrolatum       No results found. BMET    Component Value Date/Time   NA 139 12/15/2010 0500   K 3.7 12/15/2010 0500   CL 100 12/15/2010 0500   CO2 25 12/15/2010 0500   GLUCOSE 82 12/15/2010 0500   GLUCOSE 144* 01/13/2006 0000   BUN 82* 12/15/2010 0500   CREATININE 4.71* 12/15/2010 0500   CALCIUM 7.5* 12/15/2010 0500   GFRNONAA 7* 12/15/2010 0500   GFRAA 9* 12/15/2010 0500   CBC    Component Value Date/Time   WBC 7.4 12/15/2010 0500   RBC 2.74* 12/15/2010 0500   HGB 7.9* 12/15/2010 0500   HCT 23.8* 12/15/2010 0500   PLT 56* 12/15/2010 0500   MCV 86.9 12/15/2010 0500   MCH 28.8 12/15/2010 0500   MCHC 33.2 12/15/2010 0500   RDW 14.5 12/15/2010 0500   LYMPHSABS 0.2* 12/12/2010 1533   MONOABS 0.1 12/12/2010 1533   EOSABS 0.0  12/12/2010 1533   BASOSABS 0.0 12/12/2010 1533     Assessment:  1. ESRD sec pauci immune GN 2. Vol Overload 3. anemia  Plan: HD today and will try to decrease volume Start nephrovite DC bicarb DC foley VVS to for access Start outpt HD arrangements   [name]

## 2010-12-15 NOTE — Progress Notes (Signed)
Page to Elray Mcgregor, PA.  Discussed patients SPO2 at 88-90% range noted since 2200. Pt denies SOB however is slightly confused at times. Notified pt has faint crackles, new dialysis start, and scheduled for HD in AM and one unit blood. Instructed to turn oxygen up to 3L/[place]. Notify of any changes.

## 2010-12-15 NOTE — Progress Notes (Signed)
ANTIBIOTIC CONSULT NOTE - INITIAL  Pharmacy Consult for Vancomycin Indication: Enterococcus UTI  Allergies  Allergen Reactions  . Captopril     REACTION: unspecified  . Enalapril Maleate     REACTION: cough  . Nitrofurantoin     REACTION: itching  . Ramipril     REACTION: unspecified  . Sulfa Antibiotics Other (See Comments)    Reaction unknown  . Verapamil     REACTION: unspecified    Patient Measurements: Height: 5\' 3"  (160 cm) Weight: 117 lb 1 oz (53.1 kg) IBW/kg (Calculated) : 52.4   Vital Signs: Temp: 98.1 F (36.7 C) (12/10 1417) Temp src: Oral (12/10 1417) BP: 140/61 mmHg (12/10 1417) Pulse Rate: 76  (12/10 1417) Intake/Output from previous day: 12/09 0701 - 12/10 0700 In: 940 [P.O.:840; I.V.:40; IV Piggyback:60] Out: 750 [Urine:700; Stool:50] Intake/Output from this shift: Total I/O In: 240 [P.O.:240] Out: 3510 [Urine:175; Other:3335]  Labs:  [facility] 12/15/10 0500 12/14/10 0650 12/13/10 0919 12/13/10 0200  WBC 7.4 6.7 -- 12.5*  HGB 7.9* 7.7* -- 9.8*  PLT 56* 54* 63* --  LABCREA -- -- -- --  CREATININE 4.71* 4.21* -- 5.85*   Estimated Creatinine Clearance: 6.8 ml/min (by C-G formula based on Cr of 4.71). No results found for this basename: VANCOTROUGH:2,VANCOPEAK:2,VANCORANDOM:2,GENTTROUGH:2,GENTPEAK:2,GENTRANDOM:2,TOBRATROUGH:2,TOBRAPEAK:2,TOBRARND:2,AMIKACINPEAK:2,AMIKACINTROU:2,AMIKACIN:2, in the last 72 hours   Microbiology: Recent Results (from the past 720 hour(s))  URINE CULTURE     Status: Normal   Collection Time   11/21/10  8:49 PM      Component Value Range Status Comment   Specimen Description URINE, RANDOM   Final    Special Requests NONE   Final    Setup Time [id number]   Final    Colony Count 15,000 COLONIES/ML   Final    Culture     Final    Value: Multiple bacterial morphotypes present, none predominant. Suggest appropriate recollection if clinically indicated.   Report Status 11/23/2010 FINAL   Final   MRSA PCR SCREENING      Status: Abnormal   Collection Time   11/22/10  2:32 AM      Component Value Range Status Comment   MRSA by PCR POSITIVE (*) NEGATIVE  Final   URINE CULTURE     Status: Normal   Collection Time   11/22/10  4:26 AM      Component Value Range Status Comment   Specimen Description URINE, CATHETERIZED   Final    Special Requests NONE   Final    Setup Time [id number]   Final    Colony Count >=100,000 COLONIES/ML   Final    Culture     Final    Value: GROUP B STREP(S.AGALACTIAE)ISOLATED     Note: TESTING AGAINST S. AGALACTIAE NOT ROUTINELY PERFORMED DUE TO PREDICTABILITY OF AMP/PEN/VAN SUSCEPTIBILITY.   Report Status 11/23/2010 FINAL   Final   CULTURE, BLOOD (ROUTINE X 2)     Status: Normal   Collection Time   11/23/10  1:30 AM      Component Value Range Status Comment   Specimen Description BLOOD RIGHT ARM   Final    Special Requests BOTTLES DRAWN AEROBIC AND ANAEROBIC 10CC EA   Final    Setup Time [id number]   Final    Culture NO GROWTH 5 DAYS   Final    Report Status 11/29/2010 FINAL   Final   CULTURE, BLOOD (ROUTINE X 2)     Status: Normal   Collection Time   11/23/10  1:33 AM  Component Value Range Status Comment   Specimen Description BLOOD LEFT HAND   Final    Special Requests     Final    Value: BOTTLES DRAWN AEROBIC AND ANAEROBIC 10CC BLUE 5CC RED   Setup Time [id number]   Final    Culture NO GROWTH 5 DAYS   Final    Report Status 11/29/2010 FINAL   Final   URINE CULTURE     Status: Normal   Collection Time   12/13/10  4:25 AM      Component Value Range Status Comment   Specimen Description URINE, CATHETERIZED   Final    Special Requests NONE   Final    Setup Time [id number]   Final    Colony Count >=100,000 COLONIES/ML   Final    Culture ENTEROCOCCUS SPECIES   Final    Report Status 12/15/2010 FINAL   Final    Organism ID, Bacteria ENTEROCOCCUS SPECIES   Final   PATHOLOGIST SMEAR REVIEW     Status: Normal   Collection Time   12/13/10  3:28 PM       Component Value Range Status Comment   Tech Review MILD TO MODERATE ANEMIA WITH NORMAL   Final     Medical History: Past Medical History  Diagnosis Date  . Anemia     NOS  . Personal history of colonic polyps   . Diabetes mellitus     type II  . GERD (gastroesophageal reflux disease)   . Hyperlipidemia   . Hypertension   . Hypothyroidism   . Osteoarthritis   . Osteopenia   . Urinary incontinence   . Pulmonary hypertension   . Anxiety   . Renal insufficiency   . Coronary artery disease   . Aortic stenosis     Medications:  Scheduled:    . amLODipine  10 mg Oral Daily  . calcium carbonate  1 tablet Oral TID WC  . cloNIDine  0.2 mg Oral TID  . darbepoetin (ARANESP) injection - DIALYSIS  100 mcg Intravenous Q Mon-HD  . estradiol  0.5 mg Oral Daily  . feeding supplement  30 mL Oral BID  . ferrous fumarate  1 tablet Oral Daily  . folic acid  0.5 mg Oral Daily  . insulin aspart  0-5 Units Subcutaneous QHS  . insulin aspart  0-9 Units Subcutaneous TID WC  . levothyroxine  37.5 mcg Oral Custom  . levothyroxine  75 mcg Oral Custom  . magic mouthwash  5 mL Oral QID  . metoprolol  75 mg Oral BID  . multivitamin  1 tablet Oral QHS  . pantoprazole  40 mg Oral Q1200  . polyethylene glycol  17 g Oral Daily  . simvastatin  20 mg Oral QHS  . DISCONTD: cefTRIAXone (ROCEPHIN)  IV  1 g Intravenous Q24H  . DISCONTD: pantoprazole (PROTONIX) IV  40 mg Intravenous Q24H  . DISCONTD: sodium bicarbonate  1,300 mg Oral TID   Assessment: 77 year old now with ESRD, HD plans are currently being made, HD today, with enterococcus UTI  Goal of Therapy:  Appropriate dosing  Plan:  1) Vancomycin 1 Gram IV x 1 dose now, then 500 mg IV after each HD session once HD arranged 2) F/U course, plan  Thank you.  Elwin Sleight 12/15/2010,4:33 PM

## 2010-12-15 NOTE — Plan of Care (Signed)
Problem: Phase I Progression Outcomes Goal: Dyspnea controlled at rest Outcome: Progressing Tolerating Hemodialyis. Breathing improved with treatment.

## 2010-12-15 NOTE — Progress Notes (Signed)
Bilateral upper extremity vein mapping completed. Smiley Houseman 12/15/2010, 11:40 AM

## 2010-12-16 ENCOUNTER — Encounter (HOSPITAL_COMMUNITY): Payer: Self-pay | Admitting: Anesthesiology

## 2010-12-16 ENCOUNTER — Inpatient Hospital Stay (HOSPITAL_COMMUNITY): Payer: Medicare Other

## 2010-12-16 LAB — RENAL FUNCTION PANEL
Albumin: 2 g/dL — ABNORMAL LOW (ref 3.5–5.2)
CO2: 24 mEq/L (ref 19–32)
Calcium: 7.5 mg/dL — ABNORMAL LOW (ref 8.4–10.5)
Chloride: 96 mEq/L (ref 96–112)
Creatinine, Ser: 3.85 mg/dL — ABNORMAL HIGH (ref 0.50–1.10)
GFR calc Af Amer: 11 mL/min — ABNORMAL LOW (ref >90)
GFR calc non Af Amer: 10 mL/min — ABNORMAL LOW (ref >90)
Sodium: 133 mEq/L — ABNORMAL LOW (ref 135–145)

## 2010-12-16 LAB — TYPE AND SCREEN
Antibody Screen: NEGATIVE
Unit division: 0

## 2010-12-16 LAB — CBC
MCH: 29.2 pg (ref 26.0–34.0)
MCHC: 33.4 g/dL (ref 30.0–36.0)
MCV: 85.2 fL (ref 78.0–100.0)
Platelets: 57 10*3/uL — ABNORMAL LOW (ref 150–400)
Platelets: 53 10*3/uL — ABNORMAL LOW (ref 150–400)
RBC: 3.91 MIL/uL (ref 3.87–5.11)
RDW: 14.4 % (ref 11.5–15.5)
RDW: 14.3 % (ref 11.5–15.5)
WBC: 4.4 10*3/uL (ref 4.0–10.5)
WBC: 6.5 10*3/uL (ref 4.0–10.5)

## 2010-12-16 LAB — BASIC METABOLIC PANEL
BUN: 35 mg/dL — ABNORMAL HIGH (ref 6–23)
Creatinine, Ser: 2.82 mg/dL — ABNORMAL HIGH (ref 0.50–1.10)
GFR calc Af Amer: 16 mL/min — ABNORMAL LOW (ref >90)
GFR calc non Af Amer: 14 mL/min — ABNORMAL LOW (ref >90)

## 2010-12-16 LAB — PROTIME-INR
INR: 1.08 (ref 0.00–1.49)
Prothrombin Time: 14.2 seconds (ref 11.6–15.2)

## 2010-12-16 LAB — GLUCOSE, CAPILLARY
Glucose-Capillary: 172 mg/dL — ABNORMAL HIGH (ref 70–99)
Glucose-Capillary: 213 mg/dL — ABNORMAL HIGH (ref 70–99)

## 2010-12-16 MED ORDER — VANCOMYCIN HCL 1000 MG IV SOLR
1500.0000 mg | Freq: Once | INTRAVENOUS | Status: DC
Start: 2010-12-16 — End: 2010-12-16

## 2010-12-16 MED ORDER — VANCOMYCIN HCL 500 MG IV SOLR
500.0000 mg | INTRAVENOUS | Status: DC
  Administered 2010-12-16 – 2010-12-20 (×3): 500 mg via INTRAVENOUS
  Filled 2010-12-16 (×4): qty 500

## 2010-12-16 MED ORDER — DEXTROSE 5 % IV SOLN
1.5000 g | Freq: Once | INTRAVENOUS | Status: AC
  Administered 2010-12-18: 1.5 g via INTRAVENOUS
  Filled 2010-12-16 (×2): qty 1.5

## 2010-12-16 MED ORDER — DEXTROSE 5 % IV SOLN
1.5000 g | INTRAVENOUS | Status: DC
  Filled 2010-12-16: qty 1.5

## 2010-12-16 NOTE — Progress Notes (Signed)
ANTIBIOTIC CONSULT NOTE - FOLLOW UP  Pharmacy Consult for Vancomycin day#2 Indication: Enterococcal UTI; fever  Allergies  Allergen Reactions  . Captopril     REACTION: unspecified  . Enalapril Maleate     REACTION: cough  . Nitrofurantoin     REACTION: itching  . Ramipril     REACTION: unspecified  . Sulfa Antibiotics Other (See Comments)    Reaction unknown  . Verapamil     REACTION: unspecified    Patient Measurements: Height: 5\' 3"  (160 cm) Weight: 116 lb 6.5 oz (52.8 kg) IBW/kg (Calculated) : 52.4   Vital Signs: Temp: 98 F (36.7 C) (12/11 0928) Temp src: Oral (12/11 0928) BP: 183/64 mmHg (12/11 0928) Pulse Rate: 95  (12/11 0928)  Labs:  Basename 12/16/10 0543 12/16/10 0513 12/15/10 0500 12/14/10 0650  WBC -- 4.4 7.4 6.7  HGB -- 11.4* 7.9* 7.7*  PLT -- 57* 56* 54*  LABCREA -- -- -- --  CREATININE 3.85* -- 4.71* 4.21*   Estimated Creatinine Clearance: 8.4 ml/min (by C-G formula based on Cr of 3.85).  *NEW START HD PATIENT*   Microbiology: Recent Results (from the past 720 hour(s))  URINE CULTURE     Status: Normal   Collection Time   11/21/10  8:49 PM      Component Value Range Status Comment   Specimen Description URINE, RANDOM   Final    Special Requests NONE   Final    Setup Time [id number]   Final    Colony Count 15,000 COLONIES/ML   Final    Culture     Final    Value: Multiple bacterial morphotypes present, none predominant. Suggest appropriate recollection if clinically indicated.   Report Status 11/23/2010 FINAL   Final   MRSA PCR SCREENING     Status: Abnormal   Collection Time   11/22/10  2:32 AM      Component Value Range Status Comment   MRSA by PCR POSITIVE (*) NEGATIVE  Final   URINE CULTURE     Status: Normal   Collection Time   11/22/10  4:26 AM      Component Value Range Status Comment   Specimen Description URINE, CATHETERIZED   Final    Special Requests NONE   Final    Setup Time [id number]   Final    Colony Count  >=100,000 COLONIES/ML   Final    Culture     Final    Value: GROUP B STREP(S.AGALACTIAE)ISOLATED     Note: TESTING AGAINST S. AGALACTIAE NOT ROUTINELY PERFORMED DUE TO PREDICTABILITY OF AMP/PEN/VAN SUSCEPTIBILITY.   Report Status 11/23/2010 FINAL   Final   CULTURE, BLOOD (ROUTINE X 2)     Status: Normal   Collection Time   11/23/10  1:30 AM      Component Value Range Status Comment   Specimen Description BLOOD RIGHT ARM   Final    Special Requests BOTTLES DRAWN AEROBIC AND ANAEROBIC 10CC EA   Final    Setup Time [id number]   Final    Culture NO GROWTH 5 DAYS   Final    Report Status 11/29/2010 FINAL   Final   CULTURE, BLOOD (ROUTINE X 2)     Status: Normal   Collection Time   11/23/10  1:33 AM      Component Value Range Status Comment   Specimen Description BLOOD LEFT HAND   Final    Special Requests     Final    Value: BOTTLES DRAWN AEROBIC AND  ANAEROBIC 10CC BLUE 5CC RED   Setup Time [id number]   Final    Culture NO GROWTH 5 DAYS   Final    Report Status 11/29/2010 FINAL   Final   URINE CULTURE     Status: Normal   Collection Time   12/13/10  4:25 AM      Component Value Range Status Comment   Specimen Description URINE, CATHETERIZED   Final    Special Requests NONE   Final    Setup Time [id number]   Final    Colony Count >=100,000 COLONIES/ML   Final    Culture ENTEROCOCCUS SPECIES   Final    Report Status 12/15/2010 FINAL   Final    Organism ID, Bacteria ENTEROCOCCUS SPECIES   Final   PATHOLOGIST SMEAR REVIEW     Status: Normal   Collection Time   12/13/10  3:28 PM      Component Value Range Status Comment   Tech Review MILD TO MODERATE ANEMIA WITH NORMAL   Final     Anti-infectives     Start     Dose/Rate Route Frequency Ordered Stop   12/16/10 1200   vancomycin (VANCOCIN) 500 mg in sodium chloride 0.9 % 100 mL IVPB        500 mg 100 mL/hr over 60 Minutes Intravenous Every T-Th-Sa (Hemodialysis) 12/16/10 1014     12/16/10 0945   vancomycin (VANCOCIN) 1,500 mg  in sodium chloride 0.9 % 500 mL IVPB  Status:  Discontinued        1,500 mg 250 mL/hr over 120 Minutes Intravenous  Once 12/16/10 0945 12/16/10 1012   12/15/10 1645   vancomycin (VANCOCIN) IVPB 1000 mg/200 mL premix        1,000 mg 200 mL/hr over 60 Minutes Intravenous  Once 12/15/10 1635 12/15/10 2354   12/13/10 1800   cefTRIAXone (ROCEPHIN) 1 g in dextrose 5 % 50 mL IVPB  Status:  Discontinued        1 g 100 mL/hr over 30 Minutes Intravenous Every 24 hours 12/13/10 0114 12/15/10 1618   12/12/10 1830   cefTRIAXone (ROCEPHIN) 1 g in dextrose 5 % 50 mL IVPB        1 g 100 mL/hr over 30 Minutes Intravenous  Once 12/12/10 1815 12/12/10 1854           Goal of Therapy:  Pre-HD Vanc level ~25 mcg/ml  Assessment: 77 yo F initiating on Hemodialysis.  Pt received Vancomycin 1gm IV loading dose after HD yesterday.  Pt tolerated ~4hr of HD with BFR 300.  Will redose Vancomycin after HD session.  Plan:  D/C Vancomycin 1500mg  ordered by MD since patient received loading dose yesterday. Vancomycin 500mg  IV today and after each HD session.  I have entered this for Tues, Thurs, Sat but this may need to change depending on upcoming HD schedule.  [employer], Pharm.D., BCPS Clinical Pharmacist Pager [phone]  12/16/2010,10:15 AM

## 2010-12-16 NOTE — Progress Notes (Signed)
[facility] [place]  Daily Progress Note  Assessment/Planning: End stage renal disease requiring HD   Pt uncertain at this point if she wants to proceed with permanent access placement  Awaiting vein mapping  Pt scheduled for [facility] placement tomorrow, uncertain however if there is OR space to get the procedure completed  Subjective    No complaints  Objective Filed Vitals:   12/16/10 0825 12/16/10 0830 12/16/10 0837 12/16/10 0928  BP: 158/79 160/72 160/72 183/64  Pulse:  107 96 95  Temp:    98 F (36.7 C)  TempSrc:    Oral  Resp:  24 23 16   Height:      Weight:      SpO2:    95%    Intake/Output Summary (Last 24 hours) at 12/16/10 1036 Last data filed at 12/16/10 0837  Gross per 24 hour  Intake    340 ml  Output   4073 ml  Net  -3733 ml   GEN Elderly PULM  CTAB CV  RRR GI  soft, NTND EXT BLE with limited edema  Laboratory CBC    Component Value Date/Time   WBC 4.4 12/16/2010 0513   HGB 11.4* 12/16/2010 0513   HCT 33.3* 12/16/2010 0513   PLT 57* 12/16/2010 0513    BMET    Component Value Date/Time   NA 133* 12/16/2010 0543   K 3.7 12/16/2010 0543   CL 96 12/16/2010 0543   CO2 24 12/16/2010 0543   GLUCOSE 161* 12/16/2010 0543   GLUCOSE 144* 01/13/2006 0000   BUN 59* 12/16/2010 0543   CREATININE 3.85* 12/16/2010 0543   CALCIUM 7.5* 12/16/2010 0543   GFRNONAA 10* 12/16/2010 0543   GFRAA 11* 12/16/2010 0543    Leonides Sake, MD [facility] [place] Office: [phone] Pager: [phone]  12/16/2010, 10:36 AM

## 2010-12-16 NOTE — Progress Notes (Signed)
PROGRESS NOTE  [name]:[id number] DOB: Dec 31, 1920 DOA: 12/12/2010 PCP: Tillman Abide, MD, MD  Brief narrative:  77 y.o. woman who presented with increased confusion and lethargy.  She was last seen normal on Dec 6th, and the son reports that patient has been eating and drinking well, he states that she has been undergoing a renal workup due to worsening of her kidney numbers over the past few months. He states her creatinine had been 7.6, and she is undergoing a workup by renal, and had a renal biopsy and they are awaiting the results for causes of her renal problems, before the options are to be discussed such as dialysis. She has been seen by Dr. Allena Katz of [facility], and a renal specialist [facility]. Her ED workup revealed a UTI, and a BUN of >140, and a Creatinine of 6.4.   Past medical history: Anemia NOS, Personal history of colonic polyps, Diabetes mellitus type II, GERD (gastroesophageal reflux disease), Hyperlipidemia, Hypertension, Hypothyroidism, Osteoarthritis, Osteopenia, Urinary incontinence, Pulmonary hypertension, Anxiety, Renal insufficiency, Coronary artery disease, Aortic stenosis.   Consultants:   Nephrology   Vascular surgery:  Physical therapy:  Procedures:   Hemodialysis  Interim History: Permacath placement anticipated. Subjective: No complaints.  Objective:  Filed Vitals:   12/16/10 0830 12/16/10 0837 12/16/10 0928 12/16/10 1412  BP: 160/72 160/72 183/64 163/74  Pulse: 107 96 95 71  Temp:   98 F (36.7 C) 98.9 F (37.2 C)  TempSrc:   Oral Oral  Resp: 24 23 16 17   Height:      Weight:      SpO2:   95% 95%    Intake/Output Summary (Last 24 hours) at 12/16/10 1634 Last data filed at 12/16/10 1413  Gross per 24 hour  Intake    420 ml  Output   3898 ml  Net  -3478 ml    Exam: General: Appears well. Cardiovascular: Regular rate and rhythm. No murmur, rub or gallop. Respiratory: Clear to auscultation bilaterally. No wheezes,  rales or rhonchi. Normal respiratory effort. Psychiatric: Grossly normal mood and affect. Speech fluent and appropriate.  Data Reviewed: Basic Metabolic Panel:  Lab 12/16/10 8119 12/16/10 0543 12/15/10 0500 12/14/10 0650 12/13/10 0200  NA 133* 133* 139 139 138  K 3.8 3.7 -- -- --  CL 98 96 100 102 99  CO2 25 24 25 25  18*  GLUCOSE 274* 161* 82 78 201*  BUN 35* 59* 82* 80* 140*  CREATININE 2.82* 3.85* 4.71* 4.21* 5.85*  CALCIUM 7.2* 7.5* 7.5* 8.1* 8.7  MG -- -- -- -- --  PHOS -- 3.6 3.9 -- --   CBC:  Lab 12/16/10 1341 12/16/10 0513 12/15/10 0500 12/14/10 0650 12/13/10 0919 12/13/10 0200 12/12/10 1533  WBC 6.5 4.4 7.4 6.7 -- 12.5* --  NEUTROABS -- -- -- -- -- -- 12.0*  HGB 10.8* 11.4* 7.9* 7.7* -- 9.8* --  HCT 32.3* 33.3* 23.8* 22.9* -- 28.5* --  MCV 86.1 85.2 86.9 85.4 -- 84.6 --  PLT 53* 57* 56* 54* 63* -- --   CBG:  Lab 12/16/10 1152 12/16/10 0925 12/15/10 2218 12/15/10 1711 12/15/10 1153  GLUCAP 249* 172* 187* 175* 169*    Recent Results (from the past 240 hour(s))  URINE CULTURE     Status: Normal   Collection Time   12/13/10  4:25 AM      Component Value Range Status Comment   Specimen Description URINE, CATHETERIZED   Final    Special Requests NONE  Final    Setup Time [id number]   Final    Colony Count >=100,000 COLONIES/ML   Final    Culture ENTEROCOCCUS SPECIES   Final    Report Status 12/15/2010 FINAL   Final    Organism ID, Bacteria ENTEROCOCCUS SPECIES   Final   PATHOLOGIST SMEAR REVIEW     Status: Normal   Collection Time   12/13/10  3:28 PM      Component Value Range Status Comment   Tech Review MILD TO MODERATE ANEMIA WITH NORMAL   Final      Studies: Dg Chest 2 View  12/12/2010  *RADIOLOGY REPORT*  Clinical Data: Worsening hypoxia, cough, and confusion. Hypertension and diabetes.  Coronary artery disease.  CHEST - 2 VIEW  Comparison: 11/27/2010  Findings: Worsening diffuse bilateral pulmonary air space disease is seen since previous study,  consistent with pulmonary edema. This is superimposed on COPD.  Tiny pleural effusions are seen bilaterally.  Cardiomegaly remains stable.  The patient has undergone previous CABG and aortic valve replacement.  IMPRESSION:  1.  Worsening diffuse pulmonary edema/congestive heart failure, superimposed on COPD. 2.  Tiny bilateral pleural effusions. 3.  Stable cardiomegaly.  Original Report Authenticated By: Danae Orleans, M.D.   Scheduled Meds:   . amLODipine  10 mg Oral Daily  . calcium carbonate  1 tablet Oral TID WC  . cefUROXime (ZINACEF)  IV  1.5 g Intravenous Once  . cloNIDine  0.2 mg Oral TID  . darbepoetin (ARANESP) injection - DIALYSIS  100 mcg Intravenous Q Mon-HD  . estradiol  0.5 mg Oral Daily  . feeding supplement  30 mL Oral BID  . ferrous fumarate  1 tablet Oral Daily  . folic acid  0.5 mg Oral Daily  . insulin aspart  0-5 Units Subcutaneous QHS  . insulin aspart  0-9 Units Subcutaneous TID WC  . levothyroxine  37.5 mcg Oral Custom  . levothyroxine  75 mcg Oral Custom  . magic mouthwash  5 mL Oral QID  . metoprolol  75 mg Oral BID  . multivitamin  1 tablet Oral QHS  . pantoprazole  40 mg Oral Q1200  . polyethylene glycol  17 g Oral Daily  . simvastatin  20 mg Oral QHS  . vancomycin  500 mg Intravenous Q T,Th,Sa-HD  . vancomycin  1,000 mg Intravenous Once  . DISCONTD: cefUROXime (ZINACEF)  IV  1.5 g Intravenous 60 min Pre-Op  . DISCONTD: vancomycin  1,500 mg Intravenous Once   Continuous Infusions:    Assessment/Plan: 1. Acute encephalopathy: Appears to be resolved at this point. Secondary to urinary tract infection and renal failure. 2. Urinary tract infection: VRE. Continue vancomycin. 3. End-stage renal disease: Per nephrology. 4. Thrombocytopenia: Stable. May be related to her process causing renal failure. Continue to follow. She is not on prophylactic doses of heparin or Lovenox. 5. Anemia: Stable. 6. Diabetes mellitus type 2: Stable. Resume glipizide  soon.   Code Status: DO NOT RESUSCITATE  Family Communication: Disposition Plan:Home when improved   Brendia Sacks, MD  [facility] Pager [phone] 12/16/2010, 4:34 PM    LOS: 4 days

## 2010-12-16 NOTE — Progress Notes (Signed)
PC to Dr. Betti Cruz. Notified of elevated temperature 102.2. Will enter new orders for blood cultures.

## 2010-12-16 NOTE — Progress Notes (Signed)
Utilization review completed.  

## 2010-12-16 NOTE — Progress Notes (Signed)
Physical Therapy Note: order received. Pt currently with temporary femoral HD cath awaiting it being pulled to initiate eval as well as increased activity orders. Thanks Toney Sang, PT [phone]

## 2010-12-16 NOTE — Progress Notes (Signed)
Consent for procedure obtained and placed in patient's chart.

## 2010-12-16 NOTE — Progress Notes (Signed)
OT Cancellation Note: Orders received and chart reviewed. Pt unable to participate in OT eval due to temporary femoral cath placement. Awaiting for fem cath to be removed and activity orders clarified.   Lafe Garin  MS, OTR/L Acute Rehab Dept

## 2010-12-16 NOTE — Progress Notes (Signed)
Patient has had EKG done in the past 6 months and she has had chest xray done in the past year.

## 2010-12-16 NOTE — Progress Notes (Signed)
S:pt seen on HD.  Eating some.  no NV  constipated O:BP 183/64  Pulse 95  Temp(Src) 98 F (36.7 C) (Oral)  Resp 16  Ht 5\' 3"  (1.6 m)  Wt 52.8 kg (116 lb 6.5 oz)  BMI 20.62 kg/m2  SpO2 95%  Intake/Output Summary (Last 24 hours) at 12/16/10 0933 Last data filed at 12/16/10 0837  Gross per 24 hour  Intake    340 ml  Output   4073 ml  Net  -3733 ml   Weight change: -1.3 kg (-2 lb 13.9 oz) UJW:JXBJY and alert CVS:RRR Resp:bil crackles, decreased BS bases Abd:Pos BS NTND Ext:1-2+ edema NEURO:ox3 no asterixis      . amLODipine  10 mg Oral Daily  . calcium carbonate  1 tablet Oral TID WC  . cloNIDine  0.2 mg Oral TID  . darbepoetin (ARANESP) injection - DIALYSIS  100 mcg Intravenous Q Mon-HD  . estradiol  0.5 mg Oral Daily  . feeding supplement  30 mL Oral BID  . ferrous fumarate  1 tablet Oral Daily  . folic acid  0.5 mg Oral Daily  . insulin aspart  0-5 Units Subcutaneous QHS  . insulin aspart  0-9 Units Subcutaneous TID WC  . levothyroxine  37.5 mcg Oral Custom  . levothyroxine  75 mcg Oral Custom  . magic mouthwash  5 mL Oral QID  . metoprolol  75 mg Oral BID  . multivitamin  1 tablet Oral QHS  . pantoprazole  40 mg Oral Q1200  . polyethylene glycol  17 g Oral Daily  . simvastatin  20 mg Oral QHS  . vancomycin  1,000 mg Intravenous Once  . DISCONTD: cefTRIAXone (ROCEPHIN)  IV  1 g Intravenous Q24H  . DISCONTD: pantoprazole (PROTONIX) IV  40 mg Intravenous Q24H   No results found. BMET    Component Value Date/Time   NA 133* 12/16/2010 0543   K 3.7 12/16/2010 0543   CL 96 12/16/2010 0543   CO2 24 12/16/2010 0543   GLUCOSE 161* 12/16/2010 0543   GLUCOSE 144* 01/13/2006 0000   BUN 59* 12/16/2010 0543   CREATININE 3.85* 12/16/2010 0543   CALCIUM 7.5* 12/16/2010 0543   GFRNONAA 10* 12/16/2010 0543   GFRAA 11* 12/16/2010 0543   CBC    Component Value Date/Time   WBC 4.4 12/16/2010 0513   RBC 3.91 12/16/2010 0513   HGB 11.4* 12/16/2010 0513   HCT 33.3*  12/16/2010 0513   PLT 57* 12/16/2010 0513   MCV 85.2 12/16/2010 0513   MCH 29.2 12/16/2010 0513   MCHC 34.2 12/16/2010 0513   RDW 14.4 12/16/2010 0513   LYMPHSABS 0.2* 12/12/2010 1533   MONOABS 0.1 12/12/2010 1533   EOSABS 0.0 12/12/2010 1533   BASOSABS 0.0 12/12/2010 1533     Assessment:  1. ESRD sec pauci immune GN 2. Vol Overload 3. Anemia 4. Fever yest  Plan: 1. Spoke to [facility] regarding perm cath.  To be put on schedule for tomorrow 2. With fever yest and a femoral cath, i would empirically treat with vanco 3. Pull femoral cath         [name]

## 2010-12-17 ENCOUNTER — Other Ambulatory Visit: Payer: Self-pay

## 2010-12-17 ENCOUNTER — Encounter (HOSPITAL_COMMUNITY)
Admission: EM | Disposition: A | Discharge status: Skilled Nursing Facility | Payer: Self-pay | Source: Home / Self Care | Attending: Family Medicine

## 2010-12-17 LAB — TYPE AND SCREEN: ABO/RH(D): O POS

## 2010-12-17 LAB — BASIC METABOLIC PANEL
CO2: 23 mEq/L (ref 19–32)
Calcium: 7.6 mg/dL — ABNORMAL LOW (ref 8.4–10.5)
Chloride: 98 mEq/L (ref 96–112)
GFR calc Af Amer: 12 mL/min — ABNORMAL LOW (ref >90)
Sodium: 135 mEq/L (ref 135–145)

## 2010-12-17 LAB — CBC
MCV: 86.3 fL (ref 78.0–100.0)
Platelets: 59 10*3/uL — ABNORMAL LOW (ref 150–400)
RBC: 3.87 MIL/uL (ref 3.87–5.11)
RDW: 14.1 % (ref 11.5–15.5)
WBC: 5.1 10*3/uL (ref 4.0–10.5)

## 2010-12-17 LAB — GLUCOSE, CAPILLARY
Glucose-Capillary: 264 mg/dL — ABNORMAL HIGH (ref 70–99)
Glucose-Capillary: 118 mg/dL — ABNORMAL HIGH (ref 70–99)

## 2010-12-17 SURGERY — INSERTION OF DIALYSIS CATHETER
Anesthesia: Choice

## 2010-12-17 MED ORDER — PARICALCITOL 5 MCG/ML IV SOLN
1.0000 ug | INTRAVENOUS | Status: DC
  Administered 2010-12-17 – 2010-12-29 (×6): 1 ug via INTRAVENOUS
  Filled 2010-12-17 (×6): qty 0.2

## 2010-12-17 MED ORDER — INSULIN ASPART 100 UNIT/ML ~~LOC~~ SOLN
0.0000 [IU] | Freq: Three times a day (TID) | SUBCUTANEOUS | Status: DC
  Administered 2010-12-19: 2 [IU] via SUBCUTANEOUS
  Administered 2010-12-19: 5 [IU] via SUBCUTANEOUS
  Administered 2010-12-20 (×2): 3 [IU] via SUBCUTANEOUS
  Administered 2010-12-21: 2 [IU] via SUBCUTANEOUS
  Administered 2010-12-21: 3 [IU] via SUBCUTANEOUS
  Administered 2010-12-22: 5 [IU] via SUBCUTANEOUS
  Administered 2010-12-23 – 2010-12-24 (×3): 3 [IU] via SUBCUTANEOUS
  Administered 2010-12-24: 11 [IU] via SUBCUTANEOUS
  Administered 2010-12-27: 2 [IU] via SUBCUTANEOUS
  Administered 2010-12-28 (×2): 3 [IU] via SUBCUTANEOUS
  Filled 2010-12-17 (×2): qty 3

## 2010-12-17 MED ORDER — INSULIN ASPART 100 UNIT/ML ~~LOC~~ SOLN
0.0000 [IU] | Freq: Every day | SUBCUTANEOUS | Status: DC
  Administered 2010-12-21 – 2010-12-24 (×3): 2 [IU] via SUBCUTANEOUS
  Administered 2010-12-25: 3 [IU] via SUBCUTANEOUS

## 2010-12-17 NOTE — Progress Notes (Signed)
[facility] [place]  Daily Progress Note   Laboratory CBC    Component Value Date/Time   WBC 5.1 12/17/2010 0650   HGB 10.9* 12/17/2010 0650   HCT 33.4* 12/17/2010 0650   PLT 59* 12/17/2010 0650    BMET    Component Value Date/Time   NA 135 12/17/2010 0650   K 3.7 12/17/2010 0650   CL 98 12/17/2010 0650   CO2 23 12/17/2010 0650   GLUCOSE 145* 12/17/2010 0650   GLUCOSE 144* 01/13/2006 0000   BUN 43* 12/17/2010 0650   CREATININE 3.50* 12/17/2010 0650   CALCIUM 7.6* 12/17/2010 0650   GFRNONAA 11* 12/17/2010 0650   GFRAA 12* 12/17/2010 0650   - Unfortunately, I didn't appreciate this patient's thrombocytopenia until today.   - As a precaution, I will reschedule the [facility] placement tomorrow and give platelets tonight  Leonides Sake, MD [facility] [place] Office: [phone] Pager: [phone]  12/17/2010, 12:09 PM

## 2010-12-17 NOTE — Progress Notes (Signed)
Inpatient Diabetes Program Recommendations  AACE/ADA: New Consensus Statement on Inpatient Glycemic Control (2009)  Target Ranges:  Prepandial:   less than 140 mg/dL      Peak postprandial:   less than 180 mg/dL (1-2 hours)      Critically ill patients:  140 - 180 mg/dL   CBGs 95/62: 130/ 865/ 252/ 213 mg/dl CBGs today: 784/ 696 mg/dl   Inpatient Diabetes Program Recommendations Correction (SSI): Please increase Novolog SSI to Moderate scale tid ac + HS.

## 2010-12-17 NOTE — Progress Notes (Signed)
PROGRESS NOTE  [name] ZOX:[id number] DOB: [birth year] DOA: 12/12/2010 PCP: Tillman Abide, MD, MD  Brief narrative:  77 y.o. woman who presented with increased confusion and lethargy.  She was last seen normal on Dec 6th, and the son reports that patient has been eating and drinking well, he states that she has been undergoing a renal workup due to worsening of her kidney numbers over the past few months. He states her creatinine had been 7.6, and she is undergoing a workup by renal, and had a renal biopsy and they are awaiting the results for causes of her renal problems, before the options are to be discussed such as dialysis. She has been seen by Dr. Allena Katz of [facility], and a renal specialist [facility]. Her ED workup revealed a UTI, and a BUN of >140, and a Creatinine of 6.4.   Past medical history: Anemia NOS, Personal history of colonic polyps, Diabetes mellitus type II, GERD (gastroesophageal reflux disease), Hyperlipidemia, Hypertension, Hypothyroidism, Osteoarthritis, Osteopenia, Urinary incontinence, Pulmonary hypertension, Anxiety, Renal insufficiency, Coronary artery disease, Aortic stenosis.   Consultants:   Nephrology   Vascular surgery  Physical therapy: Skilled nursing facility recommended. Equipment recommendations deferred.  Procedures:   Hemodialysis  Interim History: Permacath placement anticipated tomorrow. Subjective: No complaints.  Objective:  Filed Vitals:   12/16/10 2158 12/17/10 0714 12/17/10 1000 12/17/10 1400  BP: 169/76 150/64 182/71 132/72  Pulse: 76 78 88 86  Temp: 99.6 F (37.6 C) 98.7 F (37.1 C) 98.5 F (36.9 C) 98.5 F (36.9 C)  TempSrc: Oral Oral Oral Oral  Resp: 20 18 18 18   Height:      Weight: 53 kg (116 lb 13.5 oz)     SpO2: 94% 95% 93% 96%    Intake/Output Summary (Last 24 hours) at 12/17/10 1808 Last data filed at 12/17/10 1300  Gross per 24 hour  Intake    480 ml  Output      0 ml  Net    480 ml     Exam: General: Appears well. Cardiovascular: Regular rate and rhythm. No murmur, rub or gallop. Respiratory: Clear to auscultation bilaterally. No wheezes, rales or rhonchi. Normal respiratory effort. Psychiatric: Grossly normal mood and affect. Speech fluent and appropriate.  Exam current as of December 12.  Data Reviewed: Basic Metabolic Panel:  Lab 12/17/10 8119 12/16/10 1341 12/16/10 0543 12/15/10 0500 12/14/10 0650  NA 135 133* 133* 139 139  K 3.7 3.8 -- -- --  CL 98 98 96 100 102  CO2 23 25 24 25 25   GLUCOSE 145* 274* 161* 82 78  BUN 43* 35* 59* 82* 80*  CREATININE 3.50* 2.82* 3.85* 4.71* 4.21*  CALCIUM 7.6* 7.2* 7.5* 7.5* 8.1*  MG -- -- -- -- --  PHOS -- -- 3.6 3.9 --   CBC:  Lab 12/17/10 0650 12/16/10 1341 12/16/10 0513 12/15/10 0500 12/14/10 0650 12/12/10 1533  WBC 5.1 6.5 4.4 7.4 6.7 --  NEUTROABS -- -- -- -- -- 12.0*  HGB 10.9* 10.8* 11.4* 7.9* 7.7* --  HCT 33.4* 32.3* 33.3* 23.8* 22.9* --  MCV 86.3 86.1 85.2 86.9 85.4 --  PLT 59* 53* 57* 56* 54* --   CBG:  Lab 12/17/10 1648 12/17/10 1201 12/17/10 0831 12/16/10 2156 12/16/10 1701  GLUCAP 264* 203* 154* 213* 252*    Recent Results (from the past 240 hour(s))  URINE CULTURE     Status: Normal   Collection Time   12/13/10  4:25 AM  Component Value Range Status Comment   Specimen Description URINE, CATHETERIZED   Final    Special Requests NONE   Final    Setup Time [id number]   Final    Colony Count >=100,000 COLONIES/ML   Final    Culture ENTEROCOCCUS SPECIES   Final    Report Status 12/15/2010 FINAL   Final    Organism ID, Bacteria ENTEROCOCCUS SPECIES   Final   PATHOLOGIST SMEAR REVIEW     Status: Normal   Collection Time   12/13/10  3:28 PM      Component Value Range Status Comment   Tech Review MILD TO MODERATE ANEMIA WITH NORMAL   Final   CULTURE, BLOOD (ROUTINE X 2)     Status: Normal (Preliminary result)   Collection Time   12/16/10  1:32 AM      Component Value Range Status Comment    Specimen Description BLOOD LEFT HAND   Final    Special Requests BOTTLES DRAWN AEROBIC ONLY 5CC   Final    Setup Time [id number]   Final    Culture     Final    Value:        BLOOD CULTURE RECEIVED NO GROWTH TO DATE CULTURE WILL BE HELD FOR 5 DAYS BEFORE ISSUING A FINAL NEGATIVE REPORT   Report Status PENDING   Incomplete   CULTURE, BLOOD (ROUTINE X 2)     Status: Normal (Preliminary result)   Collection Time   12/16/10  1:36 AM      Component Value Range Status Comment   Specimen Description BLOOD LEFT ARM   Final    Special Requests BOTTLES DRAWN AEROBIC AND ANAEROBIC [facility] EACH   Final    Setup Time [id number]   Final    Culture     Final    Value:        BLOOD CULTURE RECEIVED NO GROWTH TO DATE CULTURE WILL BE HELD FOR 5 DAYS BEFORE ISSUING A FINAL NEGATIVE REPORT   Report Status PENDING   Incomplete    Scheduled Meds:    . amLODipine  10 mg Oral Daily  . calcium carbonate  1 tablet Oral TID WC  . cefUROXime (ZINACEF)  IV  1.5 g Intravenous Once  . cloNIDine  0.2 mg Oral TID  . darbepoetin (ARANESP) injection - DIALYSIS  100 mcg Intravenous Q Mon-HD  . estradiol  0.5 mg Oral Daily  . feeding supplement  30 mL Oral BID  . ferrous fumarate  1 tablet Oral Daily  . folic acid  0.5 mg Oral Daily  . insulin aspart  0-5 Units Subcutaneous QHS  . insulin aspart  0-9 Units Subcutaneous TID WC  . levothyroxine  37.5 mcg Oral Custom  . levothyroxine  75 mcg Oral Custom  . magic mouthwash  5 mL Oral QID  . metoprolol  75 mg Oral BID  . multivitamin  1 tablet Oral QHS  . pantoprazole  40 mg Oral Q1200  . paricalcitol  1 mcg Intravenous 3 times weekly  . polyethylene glycol  17 g Oral Daily  . simvastatin  20 mg Oral QHS  . vancomycin  500 mg Intravenous Q T,Th,Sa-HD   Continuous Infusions:    Assessment/Plan: 1. Acute encephalopathy: Resolved. Secondary to urinary tract infection and renal failure. 2. Urinary tract infection: VRE. Continue vancomycin. 3. End-stage renal  disease: Per nephrology. 4. Thrombocytopenia: Stable. May be related to her process causing renal failure. Continue to follow. She is not on prophylactic doses  of heparin or Lovenox. Repeat CBC in the morning. Consider outpatient hematology evaluation. 5. Anemia: Stable. 6. Diabetes mellitus type 2: Poor control. Resume glipizide? Confirm she has been on this as an outpatient. Increase sliding scale insulin to moderate.  Overall improvement. Anticipate discharge after permacath placement.  Code Status: DO NOT RESUSCITATE  Family Communication: Disposition Plan: Skilled nursing facility.   Brendia Sacks, MD  [facility] Pager [phone] 12/17/2010, 6:08 PM    LOS: 5 days

## 2010-12-17 NOTE — Progress Notes (Signed)
Physical Therapy Evaluation Patient Details Name: [name] MRN: [id number] DOB: [birth year] Today's Date: 12/17/2010  Problem List:  Patient Active Problem List  Diagnoses  . HYPOTHYROIDISM  . DIABETES MELLITUS, TYPE II  . HYPERLIPIDEMIA  . ANEMIA-NOS  . ANXIETY  . HYPERTENSION  . CORONARY ARTERY DISEASE  . PULMONARY HYPERTENSION  . AORTIC STENOSIS  . CAROTID ARTERY DISEASE  . UNSPECIFIED HYPOTENSION  . PAROTIDITIS  . GERD  . NEURODERMATITIS  . OSTEOARTHRITIS  . OSTEOPENIA  . URINARY INCONTINENCE  . COLONIC POLYPS, HX OF  . Hypoglycemia  . Constipation, other cause  . Edema  . Arterial insufficiency-lower  . S/P aortic valve replacement  . Renal failure  . Hypothyroidism  . Coronary artery disease  . Anemia  . Hypocalcemia  . Hyperphosphatemia  . Encephalopathy acute  . UTI (lower urinary tract infection)  . Thrombocytopenia  . CKD (chronic kidney disease), stage V    Past Medical History:  Past Medical History  Diagnosis Date  . Anemia     NOS  . Personal history of colonic polyps   . Diabetes mellitus     type II  . GERD (gastroesophageal reflux disease)   . Hyperlipidemia   . Hypertension   . Hypothyroidism   . Osteoarthritis   . Osteopenia   . Urinary incontinence   . Pulmonary hypertension   . Anxiety   . Renal insufficiency   . Coronary artery disease   . Aortic stenosis    Past Surgical History:  Past Surgical History  Procedure Date  . Abdominal hysterectomy   . Tonsillectomy   . Cardiac catheterization 2000    cad  . Replacement total knee bilateral 05/1998  . Cataract extraction   . Coronary artery bypass graft     od  . Adenosine myoview 2007    benign, EF 69%  . Carotid endarterectomy 2011    Right  . Coronary artery bypass graft 2009  . Aortic valve replacement 2009  . Amputation-left great toe 7/12    Dr August Saucer  . Traumatic amputation of right dip joint of index finger     PT Assessment/Plan/Recommendation PT  Assessment Clinical Impression Statement: Pt with significant weakness, likely secondary to acute illness. Anticipate pt will progress to be able to return to home however given weakness displayed today PT will recommend SNF until pt able to ambulate better. Pt also had episode of Urinary incontinence during gait training unable to make it to the toilet.  This increases the pt's risk of falling.   PT Recommendation/Assessment: Patient will need skilled PT in the acute care venue PT Problem List: Decreased strength;Decreased activity tolerance;Decreased mobility;Decreased safety awareness Barriers to Discharge: Decreased caregiver support Barriers to Discharge Comments: paid care giver 4 hours per day.   PT Therapy Diagnosis : Difficulty walking;Abnormality of gait;Generalized weakness PT Plan PT Frequency: Min 3X/week PT Treatment/Interventions: DME instruction;Gait training;Functional mobility training;Therapeutic exercise;Balance training;Patient/family education PT Recommendation Follow Up Recommendations: Skilled nursing facility Equipment Recommended: Defer to next venue PT Goals  Acute Rehab PT Goals PT Goal Formulation: With patient Time For Goal Achievement: 7 days Pt will go Supine/Side to Sit: Independently PT Goal: Supine/Side to Sit - Progress: Progressing toward goal Pt will go Sit to Supine/Side: Independently PT Goal: Sit to Supine/Side - Progress: Progressing toward goal Pt will go Sit to Stand: with modified independence PT Goal: Sit to Stand - Progress: Progressing toward goal Pt will go Stand to Sit: with modified independence PT Goal: Stand to  Sit - Progress: Progressing toward goal Pt will Ambulate: >150 feet;with modified independence;with least restrictive assistive device PT Goal: Ambulate - Progress: Progressing toward goal Additional Goals Additional Goal #1: Pt will perform TUG or DGI within safe limits.   PT Goal: Additional Goal #1 - Progress: Other (comment)  (not addressed this session)  PT Evaluation Precautions/Restrictions  Precautions Precautions: Fall Restrictions Weight Bearing Restrictions: No Prior Functioning  Home Living Lives With: Alone Receives Help From: Personal care attendant Type of Home: House Home Layout: One level Home Access: Stairs to enter Entrance Stairs-Rails: Can reach both Entrance Stairs-Number of Steps: 3 Bathroom Shower/Tub: Health visitor: Standard Bathroom Accessibility: Yes Home Adaptive Equipment: Walker - rolling;Wheelchair - manual Prior Function Level of Independence: Independent with basic ADLs Able to Take Stairs?: Yes Driving: Yes Vocation: Retired Financial risk analyst Arousal/Alertness: Awake/alert Overall Cognitive Status: Appears within functional limits for tasks assessed Orientation Level: Oriented X4 Sensation/Coordination Sensation Light Touch: Appears Intact Stereognosis: Not tested Hot/Cold: Not tested Proprioception: Appears Intact Coordination Gross Motor Movements are Fluid and Coordinated: Yes Fine Motor Movements are Fluid and Coordinated: Not tested Extremity Assessment RUE Assessment RUE Assessment: Within Functional Limits LUE Assessment LUE Assessment: Within Functional Limits RLE Assessment RLE Assessment: Exceptions to [facility] RLE Strength RLE Overall Strength: Deficits RLE Overall Strength Comments: generalized deconditioning, grossly >/= 3/5 LLE Assessment LLE Assessment: Exceptions to [facility] LLE Strength LLE Overall Strength: Deficits LLE Overall Strength Comments: generalized deconditioning, grossly >/= 3/5 Mobility (including Balance) Bed Mobility Bed Mobility: Yes Supine to Sit: 7: Independent Sit to Supine - Right: 6: Modified independent (Device/Increase time);With rail Sit to Supine - Right Details (indicate cue type and reason): diffuclty lifting LEs on first try.   Transfers Transfers: Yes Sit to Stand: 5: Supervision;3: Mod  assist;From chair/3-in-1;From bed (3 trials ) Sit to Stand Details (indicate cue type and reason): Trial 1 (from bed) supervsion for safety verbal cues for hand placement; Trial 2 (from commode) mod assist secondary to LE weakness and lack of UE support.  Trial 3  supervison from recliner cues for hand placement.  Stand to Sit: 4: Min assist;With armrests;Without upper extremity assist;With upper extremity assist;To chair/3-in-1 (2 trials ) Stand to Sit Details: verbal cues for hand placement and for safe technique, min assist to contrl descent to commode . Ambulation/Gait Ambulation/Gait: Yes Ambulation/Gait Assistance: 4: Min assist;Patient percentage (comment) (80%) Ambulation/Gait Assistance Details (indicate cue type and reason): Up to mod assist for dynamic standing balance. pt initially had 3 LOB episodes while attempting to amubulate.  After the first 10 feet pt demonstrated much improved dynamic stability.  Ambulation Distance (Feet): 70 Feet Assistive device: Rolling walker Gait Pattern: Decreased stride length Stairs: No Wheelchair Mobility Wheelchair Mobility: No  Posture/Postural Control Posture/Postural Control: No significant limitations Balance Balance Assessed: Yes Static Sitting Balance Static Sitting - Balance Support: Bilateral upper extremity supported;Feet supported Static Sitting - Level of Assistance: 7: Independent Static Sitting - Comment/# of Minutes: 5+ min Static Standing Balance Static Standing - Balance Support: Bilateral upper extremity supported Static Standing - Level of Assistance: 4: Min assist Exercise    End of Session PT - End of Session Equipment Utilized During Treatment: Gait belt Activity Tolerance: Patient tolerated treatment well Patient left: in chair;with call bell in reach Nurse Communication: Mobility status for transfers;Mobility status for ambulation General Behavior During Session: [facility] for tasks performed Cognition: [facility] for tasks  performed Antoinette Haskett L. Christpoher Sievers DPT [phone] Alferd Apa 12/17/2010, 1:41 PM

## 2010-12-17 NOTE — Progress Notes (Signed)
S:eating ok  Fem hd cath out O:BP 150/64  Pulse 78  Temp(Src) 98.7 F (37.1 C) (Oral)  Resp 18  Ht 5\' 3"  (1.6 m)  Wt 53 kg (116 lb 13.5 oz)  BMI 20.70 kg/m2  SpO2 95%  Intake/Output Summary (Last 24 hours) at 12/17/10 0947 Last data filed at 12/17/10 1610  Gross per 24 hour  Intake    320 ml  Output      0 ml  Net    320 ml   Weight change: -0.1 kg (-3.5 oz) RUE:AVWUJ and alert CVS:RRR Resp:bil crackles, decreased BS bases Abd:Pos BS NTND Ext:tr edema NEURO:ox3 no asterixis      . amLODipine  10 mg Oral Daily  . calcium carbonate  1 tablet Oral TID WC  . cefUROXime (ZINACEF)  IV  1.5 g Intravenous Once  . cloNIDine  0.2 mg Oral TID  . darbepoetin (ARANESP) injection - DIALYSIS  100 mcg Intravenous Q Mon-HD  . estradiol  0.5 mg Oral Daily  . feeding supplement  30 mL Oral BID  . ferrous fumarate  1 tablet Oral Daily  . folic acid  0.5 mg Oral Daily  . insulin aspart  0-5 Units Subcutaneous QHS  . insulin aspart  0-9 Units Subcutaneous TID WC  . levothyroxine  37.5 mcg Oral Custom  . levothyroxine  75 mcg Oral Custom  . magic mouthwash  5 mL Oral QID  . metoprolol  75 mg Oral BID  . multivitamin  1 tablet Oral QHS  . pantoprazole  40 mg Oral Q1200  . polyethylene glycol  17 g Oral Daily  . simvastatin  20 mg Oral QHS  . vancomycin  500 mg Intravenous Q T,Th,Sa-HD  . DISCONTD: cefUROXime (ZINACEF)  IV  1.5 g Intravenous 60 min Pre-Op  . DISCONTD: vancomycin  1,500 mg Intravenous Once   No results found. BMET    Component Value Date/Time   NA 135 12/17/2010 0650   K 3.7 12/17/2010 0650   CL 98 12/17/2010 0650   CO2 23 12/17/2010 0650   GLUCOSE 145* 12/17/2010 0650   GLUCOSE 144* 01/13/2006 0000   BUN 43* 12/17/2010 0650   CREATININE 3.50* 12/17/2010 0650   CALCIUM 7.6* 12/17/2010 0650   GFRNONAA 11* 12/17/2010 0650   GFRAA 12* 12/17/2010 0650   CBC    Component Value Date/Time   WBC 5.1 12/17/2010 0650   RBC 3.87 12/17/2010 0650   HGB 10.9*  12/17/2010 0650   HCT 33.4* 12/17/2010 0650   PLT 59* 12/17/2010 0650   MCV 86.3 12/17/2010 0650   MCH 28.2 12/17/2010 0650   MCHC 32.6 12/17/2010 0650   RDW 14.1 12/17/2010 0650   LYMPHSABS 0.2* 12/12/2010 1533   MONOABS 0.1 12/12/2010 1533   EOSABS 0.0 12/12/2010 1533   BASOSABS 0.0 12/12/2010 1533     Assessment:  1. ESRD sec pauci immune GN 2. Vol Overload, improved 3. Anemia 4. Sec HPTH   Plan: 1. For perm Cath today 2. HD in am 3. Ambulate 4. Has outpt HD spot at [facility] HD unit TTS 5. Start Zemplar  [name] T

## 2010-12-18 ENCOUNTER — Encounter (HOSPITAL_COMMUNITY): Payer: Self-pay | Admitting: *Deleted

## 2010-12-18 ENCOUNTER — Inpatient Hospital Stay (HOSPITAL_COMMUNITY): Payer: Medicare Other

## 2010-12-18 ENCOUNTER — Inpatient Hospital Stay (HOSPITAL_COMMUNITY): Payer: Medicare Other | Admitting: *Deleted

## 2010-12-18 ENCOUNTER — Encounter (HOSPITAL_COMMUNITY)
Admission: EM | Disposition: A | Discharge status: Skilled Nursing Facility | Payer: Self-pay | Source: Home / Self Care | Attending: Family Medicine

## 2010-12-18 HISTORY — PX: INSERTION OF DIALYSIS CATHETER: SHX1324

## 2010-12-18 LAB — GLUCOSE, CAPILLARY
Glucose-Capillary: 123 mg/dL — ABNORMAL HIGH (ref 70–99)
Glucose-Capillary: 119 mg/dL — ABNORMAL HIGH (ref 70–99)
Glucose-Capillary: 125 mg/dL — ABNORMAL HIGH (ref 70–99)
Glucose-Capillary: 98 mg/dL (ref 70–99)

## 2010-12-18 LAB — CBC
MCH: 29.2 pg (ref 26.0–34.0)
MCHC: 34.1 g/dL (ref 30.0–36.0)
Platelets: 90 10*3/uL — ABNORMAL LOW (ref 150–400)
RDW: 13.9 % (ref 11.5–15.5)

## 2010-12-18 SURGERY — INSERTION OF DIALYSIS CATHETER
Anesthesia: Monitor Anesthesia Care | Site: Chest | Laterality: Right | Wound class: Clean

## 2010-12-18 MED ORDER — LIDOCAINE HCL (PF) 1 % IJ SOLN
INTRAMUSCULAR | Status: DC | PRN
  Administered 2010-12-18: 16 mL

## 2010-12-18 MED ORDER — DARBEPOETIN ALFA-POLYSORBATE 100 MCG/0.5ML IJ SOLN
100.0000 ug | INTRAMUSCULAR | Status: DC
  Administered 2010-12-23 – 2010-12-29 (×2): 100 ug via INTRAVENOUS
  Filled 2010-12-18: qty 0.5

## 2010-12-18 MED ORDER — SODIUM CHLORIDE 0.9 % IV SOLN
INTRAVENOUS | Status: DC | PRN
  Administered 2010-12-18: 10:00:00 via INTRAVENOUS

## 2010-12-18 MED ORDER — SODIUM CHLORIDE 0.9 % IR SOLN
Status: DC | PRN
  Administered 2010-12-18: 11:00:00

## 2010-12-18 MED ORDER — HEPARIN SODIUM (PORCINE) 1000 UNIT/ML IJ SOLN
INTRAMUSCULAR | Status: DC | PRN
  Administered 2010-12-18: 4.2 mL

## 2010-12-18 MED ORDER — ONDANSETRON HCL 4 MG/2ML IJ SOLN: 4.0000 mg | Freq: Once | INTRAMUSCULAR | Status: DC | PRN

## 2010-12-18 MED ORDER — ALTEPLASE 100 MG IV SOLR
2.0000 mg | Freq: Once | INTRAVENOUS | Status: AC
  Administered 2010-12-18: 2 mg
  Filled 2010-12-18 (×2): qty 2

## 2010-12-18 MED ORDER — PROPOFOL 10 MG/ML IV EMUL
INTRAVENOUS | Status: DC | PRN
  Administered 2010-12-18: 25 ug/kg/min via INTRAVENOUS

## 2010-12-18 MED ORDER — HYDROMORPHONE HCL PF 1 MG/ML IJ SOLN: 0.2500 mg | INTRAMUSCULAR | Status: DC | PRN

## 2010-12-18 MED ORDER — ALTEPLASE 100 MG IV SOLR
2.0000 mg | Freq: Once | INTRAVENOUS | Status: DC
  Filled 2010-12-18: qty 2

## 2010-12-18 SURGICAL SUPPLY — 41 items
BAG DECANTER FOR FLEXI CONT (MISCELLANEOUS) ×2 IMPLANT
CATH CANNON HEMO 15F 50CM (CATHETERS) IMPLANT
CATH CANNON HEMO 15FR 19 (HEMODIALYSIS SUPPLIES) ×2 IMPLANT
CATH CANNON HEMO 15FR 23CM (HEMODIALYSIS SUPPLIES) IMPLANT
CATH CANNON HEMO 15FR 31CM (HEMODIALYSIS SUPPLIES) IMPLANT
CATH CANNON HEMO 15FR 32CM (HEMODIALYSIS SUPPLIES) IMPLANT
CLOTH BEACON ORANGE TIMEOUT ST (SAFETY) ×2 IMPLANT
COVER PROBE W GEL 5X96 (DRAPES) IMPLANT
COVER SURGICAL LIGHT HANDLE (MISCELLANEOUS) ×2 IMPLANT
DECANTER SPIKE VIAL GLASS SM (MISCELLANEOUS) ×2 IMPLANT
DRAPE C-ARM 42X72 X-RAY (DRAPES) ×2 IMPLANT
DRAPE CHEST BREAST 15X10 FENES (DRAPES) ×2 IMPLANT
GAUZE SPONGE 2X2 8PLY STRL LF (GAUZE/BANDAGES/DRESSINGS) ×1 IMPLANT
GAUZE SPONGE 4X4 16PLY XRAY LF (GAUZE/BANDAGES/DRESSINGS) ×2 IMPLANT
GLOVE BIOGEL PI IND STRL 7.0 (GLOVE) ×2 IMPLANT
GLOVE BIOGEL PI INDICATOR 7.0 (GLOVE) ×2
GLOVE SS BIOGEL STRL SZ 7 (GLOVE) ×2 IMPLANT
GLOVE SUPERSENSE BIOGEL SZ 7 (GLOVE) ×2
GOWN STRL NON-REIN LRG LVL3 (GOWN DISPOSABLE) ×2 IMPLANT
GOWN STRL REIN XL XLG (GOWN DISPOSABLE) ×2 IMPLANT
KIT BASIN OR (CUSTOM PROCEDURE TRAY) ×2 IMPLANT
KIT ROOM TURNOVER OR (KITS) ×2 IMPLANT
NEEDLE 18GX1X1/2 (RX/OR ONLY) (NEEDLE) ×2 IMPLANT
NEEDLE 22X1 1/2 (OR ONLY) (NEEDLE) ×2 IMPLANT
NEEDLE HYPO 25GX1X1/2 BEV (NEEDLE) ×2 IMPLANT
NS IRRIG 1000ML POUR BTL (IV SOLUTION) ×2 IMPLANT
PACK SURGICAL SETUP 50X90 (CUSTOM PROCEDURE TRAY) ×2 IMPLANT
PAD ARMBOARD 7.5X6 YLW CONV (MISCELLANEOUS) ×4 IMPLANT
SOAP 2 % CHG 4 OZ (WOUND CARE) ×2 IMPLANT
SPONGE GAUZE 2X2 STER 10/PKG (GAUZE/BANDAGES/DRESSINGS) ×1
SUT ETHILON 3 0 PS 1 (SUTURE) ×2 IMPLANT
SUT VICRYL 4-0 PS2 18IN ABS (SUTURE) ×2 IMPLANT
SYR 20CC LL (SYRINGE) ×2 IMPLANT
SYR 30ML LL (SYRINGE) IMPLANT
SYR 5ML LL (SYRINGE) ×4 IMPLANT
SYR CONTROL 10ML LL (SYRINGE) ×2 IMPLANT
SYRINGE 10CC LL (SYRINGE) ×2 IMPLANT
TAPE CLOTH SURG 4X10 WHT LF (GAUZE/BANDAGES/DRESSINGS) ×2 IMPLANT
TOWEL OR 17X24 6PK STRL BLUE (TOWEL DISPOSABLE) ×2 IMPLANT
TOWEL OR 17X26 10 PK STRL BLUE (TOWEL DISPOSABLE) ×2 IMPLANT
WATER STERILE IRR 1000ML POUR (IV SOLUTION) ×2 IMPLANT

## 2010-12-18 NOTE — Interval H&P Note (Signed)
History and Physical Interval Note:  12/18/2010 10:09 AM  [name]  has presented today for surgery, with the diagnosis of ESRD  The various methods of treatment have been discussed with the patient and family. After consideration of risks, benefits and other options for treatment, the patient has consented to  Procedure(s): INSERTION OF DIALYSIS CATHETER as a surgical intervention .  The patients' history has been reviewed, patient examined, no change in status, stable for surgery.  I have reviewed the patients' chart and labs.  Questions were answered to the patient's satisfaction.     [name],[name]

## 2010-12-18 NOTE — Anesthesia Preprocedure Evaluation (Addendum)
Anesthesia Evaluation  Patient identified by MRN, date of birth, ID band Patient confused    Reviewed: Allergy & Precautions, H&P , NPO status , Patient's Chart, lab work & pertinent test results  Airway Mallampati: I TM Distance: >3 FB and <3 FB Neck ROM: full    Dental  (+) Partial Upper   Pulmonary neg pulmonary ROS,          Cardiovascular hypertension, + CAD + Valvular Problems/Murmurs regular Normal    Neuro/Psych PSYCHIATRIC DISORDERS    GI/Hepatic Neg liver ROS, GERD-  ,  Endo/Other  Diabetes mellitus-, Oral Hypoglycemic Agents  Renal/GU   Genitourinary negative   Musculoskeletal   Abdominal   Peds  Hematology negative hematology ROS (+)   Anesthesia Other Findings   Reproductive/Obstetrics                         Anesthesia Physical Anesthesia Plan  ASA: III  Anesthesia Plan: MAC   Post-op Pain Management:    Induction: Intravenous  Airway Management Planned:   Additional Equipment:   Intra-op Plan:   Post-operative Plan:   Informed Consent: I have reviewed the patients History and Physical, chart, labs and discussed the procedure including the risks, benefits and alternatives for the proposed anesthesia with the patient or authorized representative who has indicated his/her understanding and acceptance.     Plan Discussed with: Anesthesiologist, CRNA and Surgeon  Anesthesia Plan Comments:        Anesthesia Quick Evaluation

## 2010-12-18 NOTE — Preoperative (Signed)
Beta Blockers   Reason not to administer Beta Blockers:Not Applicable, took med last pm

## 2010-12-18 NOTE — Progress Notes (Addendum)
Patient ID: [name], female   DOB: [birth year], 77 y.o.   MRN: [id number]  S:Reports to be doing well except for permacath site pain. Denies any shortness of breath and awaits dialysis.   O: BP 172/75  Pulse 85  Temp(Src) 98.7 F (37.1 C) (Oral)  Resp 20  Ht 5\' 3"  (1.6 m)  Wt 52.8 kg (116 lb 6.5 oz)  BMI 20.62 kg/m2  SpO2 91%  WUJ:WJXBJYNWGNF resting in bed AOZ:HYQMV RRR, normal S1 and S2 sounds  Resp:Coarse BS bilaterally (anterior chest) HQI:ONGE, flat, NT, BS normal XBM:WUXLK LE edema      . amLODipine  10 mg Oral Daily  . calcium carbonate  1 tablet Oral TID WC  . cefUROXime (ZINACEF)  IV  1.5 g Intravenous Once  . cloNIDine  0.2 mg Oral TID  . darbepoetin (ARANESP) injection - DIALYSIS  100 mcg Intravenous Q Tue-HD  . estradiol  0.5 mg Oral Daily  . feeding supplement  30 mL Oral BID  . ferrous fumarate  1 tablet Oral Daily  . folic acid  0.5 mg Oral Daily  . insulin aspart  0-15 Units Subcutaneous TID WC  . insulin aspart  0-5 Units Subcutaneous QHS  . levothyroxine  37.5 mcg Oral Custom  . levothyroxine  75 mcg Oral Custom  . magic mouthwash  5 mL Oral QID  . metoprolol  75 mg Oral BID  . multivitamin  1 tablet Oral QHS  . pantoprazole  40 mg Oral Q1200  . paricalcitol  1 mcg Intravenous 3 times weekly  . polyethylene glycol  17 g Oral Daily  . simvastatin  20 mg Oral QHS  . vancomycin  500 mg Intravenous Q T,Th,Sa-HD  . DISCONTD: darbepoetin (ARANESP) injection - DIALYSIS  100 mcg Intravenous Q Mon-HD  . DISCONTD: insulin aspart  0-5 Units Subcutaneous QHS  . DISCONTD: insulin aspart  0-9 Units Subcutaneous TID WC    BMET  Lab 12/17/10 0650 12/16/10 1341 12/16/10 0543 12/15/10 0500 12/14/10 0650 12/13/10 0200 12/12/10 1600  NA 135 133* 133* 139 139 138 137  K 3.7 3.8 3.7 3.7 3.8 4.4 4.8  CL 98 98 96 100 102 99 107  CO2 23 25 24 25 25  18* --  GLUCOSE 145* 274* 161* 82 78 201* 252*  BUN 43* 35* 59* 82* 80* 140* >140*  CREATININE 3.50* 2.82* 3.85*  4.71* 4.21* 5.85* 6.40*  ALB -- -- -- -- -- -- --  CALCIUM 7.6* 7.2* 7.5* 7.5* 8.1* 8.7 --  PHOS -- -- 3.6 3.9 -- -- --   CBC  Lab 12/18/10 0830 12/17/10 0650 12/16/10 1341 12/16/10 0513 12/12/10 1533  WBC 5.1 5.1 6.5 4.4 --  NEUTROABS -- -- -- -- 12.0*  HGB 11.4* 10.9* 10.8* 11.4* --  HCT 33.4* 33.4* 32.3* 33.3* --  MCV 85.4 86.3 86.1 85.2 --  PLT 90* 59* 53* 57* --    CXR: Post Permacath shows catheter tip at Cavo-atrial junction, no pneumothorax and improved pulmonary edema.   Assessment/Plan: 1.Altered mental status: suspected to be from uremic encephalopathy and better with hemodialysis/clearance. 2.ESRD: Await HD today, apparently accepted to HD spot in [place] on a TTS schedule. Will confirm with unit if she can start Saturday (Logistic problems). Appears that permanent access placement will likely need to be done as an outpatient- thrombocytopenia/patient's wishes on 12/11 3. Anemia:Hemoglobin at goal, anticipate some drop after PC placement. On Aranesp. 4. CKD-MBD:On TIDAC Calcium carbonate and zemplar Q HD. 5. Nutrition:Will check albumin level  with next labs, potassium well controlled in the hospital. 6. Hypertension:Monitor BP with HD/UF, on Norvasc, Clonidine and metoprolol.  Zetta Bills MD [facility] Office # [phone] Pager # [phone] 3:00 PM   Interval Update 3:13 PM I called the Social Worker Jasmine December) and [employer] who inform me that Ms. Peron's family member will have to go to the HD unit tomorrow to sign  intake forms/paperwork tomorrow in order to start dialysis on Saturday AM. Will inform her son of this in order to allow DC to SNF tomorrow.

## 2010-12-18 NOTE — Progress Notes (Signed)
OT Cancellation Note: Unable to see pt this morning due to pt being in OR and will go for dialysis this afternoon  Lafe Garin  MS, OTR/L Acute Rehab Dept

## 2010-12-18 NOTE — Progress Notes (Addendum)
PROGRESS NOTE  [name] RUE:[id number] DOB: 25-Apr-1920 DOA: 12/12/2010 PCP: Tillman Abide, MD, MD  Brief narrative:  77 y.o. woman who presented with increased confusion and lethargy.  She was last seen normal on Dec 6th, and the son reports that patient has been eating and drinking well, he states that she has been undergoing a renal workup due to worsening of her kidney numbers over the past few months. He states her creatinine had been 7.6, and she is undergoing a workup by renal, and had a renal biopsy and they are awaiting the results for causes of her renal problems, before the options are to be discussed such as dialysis. She has been seen by Dr. Allena Katz of [facility], and a renal specialist [facility]. Her ED workup revealed a UTI, and a BUN of >140, and a Creatinine of 6.4.   Past medical history: Anemia NOS, Personal history of colonic polyps, Diabetes mellitus type II, GERD (gastroesophageal reflux disease), Hyperlipidemia, Hypertension, Hypothyroidism, Osteoarthritis, Osteopenia, Urinary incontinence, Pulmonary hypertension, Anxiety, Renal insufficiency, Coronary artery disease, Aortic stenosis.   Consultants:   Nephrology   Vascular surgery  Physical therapy: Skilled nursing facility recommended. Equipment recommendations deferred.  Procedures:   Hemodialysis  December 13: Insertion of dialysis catheter  December 12: Platelet transfusion  Interim History: Has undergone successful placement of dialysis catheter. Subjective: No complaints.  Objective:  Filed Vitals:   12/18/10 0733 12/18/10 1130 12/18/10 1145 12/18/10 1200  BP: 164/79 159/57 168/95 172/61  Pulse: 88 80 80 78  Temp: 98.7 F (37.1 C) 98.5 F (36.9 C)    TempSrc: Oral     Resp: 22 27 32 27  Height:      Weight:      SpO2: 91% 87% 96% 96%    Intake/Output Summary (Last 24 hours) at 12/18/10 1232 Last data filed at 12/18/10 1109  Gross per 24 hour  Intake  792.5 ml  Output      0 ml    Net  792.5 ml    Exam: General: Appears well. Cardiovascular: Regular rate and rhythm. No murmur, rub or gallop. Respiratory: Clear to auscultation bilaterally. No wheezes, rales or rhonchi. Normal respiratory effort. Psychiatric: Grossly normal mood and affect. Speech fluent and appropriate.  Exam current as of December 13.  Data Reviewed: Basic Metabolic Panel:  Lab 12/17/10 1478 12/16/10 1341 12/16/10 0543 12/15/10 0500 12/14/10 0650  NA 135 133* 133* 139 139  K 3.7 3.8 -- -- --  CL 98 98 96 100 102  CO2 23 25 24 25 25   GLUCOSE 145* 274* 161* 82 78  BUN 43* 35* 59* 82* 80*  CREATININE 3.50* 2.82* 3.85* 4.71* 4.21*  CALCIUM 7.6* 7.2* 7.5* 7.5* 8.1*  MG -- -- -- -- --  PHOS -- -- 3.6 3.9 --   CBC:  Lab 12/18/10 0830 12/17/10 0650 12/16/10 1341 12/16/10 0513 12/15/10 0500 12/12/10 1533  WBC 5.1 5.1 6.5 4.4 7.4 --  NEUTROABS -- -- -- -- -- 12.0*  HGB 11.4* 10.9* 10.8* 11.4* 7.9* --  HCT 33.4* 33.4* 32.3* 33.3* 23.8* --  MCV 85.4 86.3 86.1 85.2 86.9 --  PLT 90* 59* 53* 57* 56* --   CBG:  Lab 12/18/10 1128 12/18/10 0732 12/17/10 2325 12/17/10 1648 12/17/10 1201  GLUCAP 119* 123* 118* 264* 203*    Recent Results (from the past 240 hour(s))  URINE CULTURE     Status: Normal   Collection Time   12/13/10  4:25 AM  Component Value Range Status Comment   Specimen Description URINE, CATHETERIZED   Final    Special Requests NONE   Final    Setup Time [id number]   Final    Colony Count >=100,000 COLONIES/ML   Final    Culture ENTEROCOCCUS SPECIES   Final    Report Status 12/15/2010 FINAL   Final    Organism ID, Bacteria ENTEROCOCCUS SPECIES   Final   PATHOLOGIST SMEAR REVIEW     Status: Normal   Collection Time   12/13/10  3:28 PM      Component Value Range Status Comment   Tech Review MILD TO MODERATE ANEMIA WITH NORMAL   Final   CULTURE, BLOOD (ROUTINE X 2)     Status: Normal (Preliminary result)   Collection Time   12/16/10  1:32 AM      Component Value  Range Status Comment   Specimen Description BLOOD LEFT HAND   Final    Special Requests BOTTLES DRAWN AEROBIC ONLY 5CC   Final    Setup Time [id number]   Final    Culture     Final    Value:        BLOOD CULTURE RECEIVED NO GROWTH TO DATE CULTURE WILL BE HELD FOR 5 DAYS BEFORE ISSUING A FINAL NEGATIVE REPORT   Report Status PENDING   Incomplete   CULTURE, BLOOD (ROUTINE X 2)     Status: Normal (Preliminary result)   Collection Time   12/16/10  1:36 AM      Component Value Range Status Comment   Specimen Description BLOOD LEFT ARM   Final    Special Requests BOTTLES DRAWN AEROBIC AND ANAEROBIC [facility] EACH   Final    Setup Time [id number]   Final    Culture     Final    Value:        BLOOD CULTURE RECEIVED NO GROWTH TO DATE CULTURE WILL BE HELD FOR 5 DAYS BEFORE ISSUING A FINAL NEGATIVE REPORT   Report Status PENDING   Incomplete    Scheduled Meds:    . alteplase  2 mg Intracatheter Once  . alteplase  2 mg Intracatheter Once  . amLODipine  10 mg Oral Daily  . calcium carbonate  1 tablet Oral TID WC  . cefUROXime (ZINACEF)  IV  1.5 g Intravenous Once  . cloNIDine  0.2 mg Oral TID  . darbepoetin (ARANESP) injection - DIALYSIS  100 mcg Intravenous Q Tue-HD  . estradiol  0.5 mg Oral Daily  . feeding supplement  30 mL Oral BID  . ferrous fumarate  1 tablet Oral Daily  . folic acid  0.5 mg Oral Daily  . insulin aspart  0-15 Units Subcutaneous TID WC  . insulin aspart  0-5 Units Subcutaneous QHS  . levothyroxine  37.5 mcg Oral Custom  . levothyroxine  75 mcg Oral Custom  . magic mouthwash  5 mL Oral QID  . metoprolol  75 mg Oral BID  . multivitamin  1 tablet Oral QHS  . pantoprazole  40 mg Oral Q1200  . paricalcitol  1 mcg Intravenous 3 times weekly  . polyethylene glycol  17 g Oral Daily  . simvastatin  20 mg Oral QHS  . vancomycin  500 mg Intravenous Q T,Th,Sa-HD  . DISCONTD: darbepoetin (ARANESP) injection - DIALYSIS  100 mcg Intravenous Q Mon-HD  . DISCONTD: insulin aspart   0-5 Units Subcutaneous QHS  . DISCONTD: insulin aspart  0-9 Units Subcutaneous TID WC   Continuous  Infusions:    Assessment/Plan: 1. Acute encephalopathy: Resolved. Secondary to urinary tract infection and renal failure. 2. Urinary tract infection: VRE. Continue vancomycin. 3. End-stage renal disease: Per nephrology. 4. Thrombocytopenia: Improved today after transfusion. May be related to her process causing renal failure. Continue to follow. She is not on prophylactic doses of heparin or Lovenox.  Consider outpatient hematology evaluation. 5. Anemia: Stable. 6. Diabetes mellitus type 2: Improved control. Consider resuming glipizide if she has been on this as an outpatient. Continue sliding scale insulin.   Overall improvement. Anticipate discharge soon.  Code Status: DO NOT RESUSCITATE   Disposition Plan: Skilled nursing facility.   Brendia Sacks, MD  [facility] Pager [phone] 12/18/2010, 12:32 PM    LOS: 6 days

## 2010-12-18 NOTE — Transfer of Care (Signed)
Immediate Anesthesia Transfer of Care Note  Patient: [name]  Procedure(s) Performed:  INSERTION OF DIALYSIS CATHETER  Patient Location: PACU  Anesthesia Type: MAC  Level of Consciousness: awake, oriented and patient cooperative  Airway & Oxygen Therapy: Patient Spontanous Breathing and Patient connected to nasal cannula oxygen  Post-op Assessment: Report given to PACU RN and Post -op Vital signs reviewed and stable  Post vital signs: Reviewed  Complications: No apparent anesthesia complications

## 2010-12-18 NOTE — Progress Notes (Signed)
Clinical Social Worker has attempted to assess pt for SNF placement twice today. Pt was in procedure this am and pt is currently in dialysis. Clinical Social Worker to complete full psychosocial assessment Friday 12/14. Clinical Social Worker to continue to follow for d/c planning.  [name], MSW, LCSWA  Clinical Social Work [phone]

## 2010-12-18 NOTE — Progress Notes (Signed)
Pt is off unit

## 2010-12-18 NOTE — Op Note (Signed)
OPERATIVE REPORT  Date of Surgery: 12/12/2010 - 12/18/2010  Surgeon: Josephina Gip, MD  Assistant: Nurse  Pre-op Diagnosis: End Stage Renal Disease  Post-op Diagnosis: End Stage Renal Disease  Procedure: Procedure(s): INSERTION OF DIALYSIS CATHETER right IJ-19 cm Bilateral ultrasound localization internal jugular veins Anesthesia: MAC  EBL: Minimal  Complications: None  Procedure Details: Patient was taken to the operating room placed in the supine position at which time the upper chest and neck were exposed. Both internal jugular veins were imaged using B. mode ultrasound. Both were noted to be widely patent. After prepping and draping in the routine sterile manner the right internal jugular vein was entered using a supraclavicular approach after infiltration with 1% Xylocaine. A guidewire was passed into the right atrium and after dilating the track appropriately a 19 cm no dialysis catheter was passed through a peel-away sheath positioned in the right atrium and tunnel peripherally. It was secured with a nylon suture the wound closed with Vicryl in a subcuticular fashion. Sterile dressing applied patient taken to the recovery room for portable chest x-ray   Josephina Gip, MD 12/18/2010 11:21 AM

## 2010-12-18 NOTE — Procedures (Signed)
Patient seen on hemodialysis. BFR: 27mL/min   BP: 185/79    UF:  3.5L Voices no complaints.  Zetta Bills MD [facility]. Office # [phone] Pager # [phone]  4:08 PM

## 2010-12-18 NOTE — H&P (View-Only) (Signed)
[facility]  Daily Progress Note   Laboratory CBC    Component Value Date/Time   WBC 5.1 12/17/2010 0650   HGB 10.9* 12/17/2010 0650   HCT 33.4* 12/17/2010 0650   PLT 59* 12/17/2010 0650    BMET    Component Value Date/Time   NA 135 12/17/2010 0650   K 3.7 12/17/2010 0650   CL 98 12/17/2010 0650   CO2 23 12/17/2010 0650   GLUCOSE 145* 12/17/2010 0650   GLUCOSE 144* 01/13/2006 0000   BUN 43* 12/17/2010 0650   CREATININE 3.50* 12/17/2010 0650   CALCIUM 7.6* 12/17/2010 0650   GFRNONAA 11* 12/17/2010 0650   GFRAA 12* 12/17/2010 0650   - Unfortunately, I didn't appreciate this patient's thrombocytopenia until today.   - As a precaution, I will reschedule the TDC placement tomorrow and give platelets tonight  Lubertha Leite, MD [facility] Office: [phone] Pager: [phone]  12/17/2010, 12:09 PM     

## 2010-12-18 NOTE — Anesthesia Postprocedure Evaluation (Signed)
  Anesthesia Post-op Note  Patient: [name]  Procedure(s) Performed:  INSERTION OF DIALYSIS CATHETER  Patient Location: PACU  Anesthesia Type: MAC  Level of Consciousness: awake, alert  and patient cooperative  Airway and Oxygen Therapy: Patient Spontanous Breathing and Patient connected to nasal cannula oxygen  Post-op Pain: none  Post-op Assessment: Post-op Vital signs reviewed, Patient's Cardiovascular Status Stable, Respiratory Function Stable, Patent Airway, No signs of Nausea or vomiting and Pain level controlled  Post-op Vital Signs: stable  Complications: No apparent anesthesia complications

## 2010-12-19 ENCOUNTER — Encounter (HOSPITAL_COMMUNITY): Payer: Self-pay | Admitting: Vascular Surgery

## 2010-12-19 ENCOUNTER — Inpatient Hospital Stay (HOSPITAL_COMMUNITY): Payer: Medicare Other

## 2010-12-19 LAB — PREPARE PLATELET PHERESIS: Unit division: 0

## 2010-12-19 LAB — RENAL FUNCTION PANEL
Albumin: 2 g/dL — ABNORMAL LOW (ref 3.5–5.2)
BUN: 43 mg/dL — ABNORMAL HIGH (ref 6–23)
Chloride: 99 mEq/L (ref 96–112)
Potassium: 3.6 mEq/L (ref 3.5–5.1)

## 2010-12-19 LAB — CBC
HCT: 34.3 % — ABNORMAL LOW (ref 36.0–46.0)
Hemoglobin: 11.6 g/dL — ABNORMAL LOW (ref 12.0–15.0)
RDW: 13.9 % (ref 11.5–15.5)
WBC: 5.7 10*3/uL (ref 4.0–10.5)

## 2010-12-19 LAB — GLUCOSE, CAPILLARY
Glucose-Capillary: 139 mg/dL — ABNORMAL HIGH (ref 70–99)
Glucose-Capillary: 211 mg/dL — ABNORMAL HIGH (ref 70–99)
Glucose-Capillary: 174 mg/dL — ABNORMAL HIGH (ref 70–99)

## 2010-12-19 MED ORDER — LOSARTAN POTASSIUM 50 MG PO TABS
50.0000 mg | ORAL_TABLET | Freq: Every day | ORAL | Status: DC
  Administered 2010-12-19 – 2010-12-22 (×4): 50 mg via ORAL
  Filled 2010-12-19 (×4): qty 1

## 2010-12-19 MED ORDER — PARICALCITOL 5 MCG/ML IV SOLN
INTRAVENOUS | Status: AC
  Administered 2010-12-19: 1 ug via INTRAVENOUS
  Filled 2010-12-19: qty 1

## 2010-12-19 NOTE — Progress Notes (Signed)
Clinical Social Worker completed psychosocial assessment and placed in shadow chart. Pt agreeable to SNF search in [facility]. Pt provided permission for this Clinical Social Worker to contact pt son. Clinical Social Worker attempted to contact pt son and left a message. Clinical Social Worker completed FL-2 (MD please sign) and initiated SNF search in [place], FL-2 and placement note can be found in shadow chart. Clinical Social Worker to follow up with bed offers. Pt insurance requires authorization before pt able to D/C to SNF. Once decision is made by pt about SNF, facility will begin insurance authorization.  Clinical Social Worker to facilitate pt D/C needs.  [name], MSW, LCSWA  Clinical Social Work [phone]

## 2010-12-19 NOTE — Progress Notes (Signed)
Clinical Social Worker attempted psychosocial assessment this morning, but pt currently in dialysis. Clinical Social Worker to follow up today to assess for SNF placement.  [name], MSW, LCSWA  Clinical Social Work [phone]

## 2010-12-19 NOTE — Progress Notes (Signed)
OT Cancellation Note:  Unable to eval pt this afternoon after two attempts. Pt sleeping heavily after HD on first attempt and eating lunch at second attempt. Will recheck tomorrow  Lafe Garin  MS, OTR/L Acute Rehab Dept

## 2010-12-19 NOTE — Progress Notes (Signed)
ANTIBIOTIC CONSULT NOTE - INITIAL  Pharmacy Consult for vancomycin Indication: enterococcal infection in the urine  Allergies  Allergen Reactions  . Captopril     REACTION: unspecified  . Enalapril Maleate     REACTION: cough  . Nitrofurantoin     REACTION: itching  . Ramipril     REACTION: unspecified  . Sulfa Antibiotics Other (See Comments)    Reaction unknown  . Verapamil     REACTION: unspecified    Patient Measurements: Height: 5\' 3"  (160 cm) Weight: 108 lb 0.4 oz (49 kg) IBW/kg (Calculated) : 52.4   Vital Signs: Temp: 98.4 F (36.9 C) (12/14 0945) Temp src: Oral (12/14 0945) BP: 168/73 mmHg (12/14 1106) Pulse Rate: 97  (12/14 1106) Intake/Output from previous day: 12/13 0701 - 12/14 0700 In: 500 [I.V.:500] Out: 562  Intake/Output from this shift: Total I/O In: 0  Out: 1500 [Other:1500]  Labs:  [facility] 12/19/10 0719 12/19/10 0718 12/18/10 0830 12/17/10 0650  WBC -- 5.7 5.1 5.1  HGB -- 11.6* 11.4* 10.9*  PLT -- 78* 90* 59*  LABCREA -- -- -- --  CREATININE 4.02* -- -- 3.50*   Estimated Creatinine Clearance: 7.5 ml/min (by C-G formula based on Cr of 4.02). No results found for this basename: VANCOTROUGH:2,VANCOPEAK:2,VANCORANDOM:2,GENTTROUGH:2,GENTPEAK:2,GENTRANDOM:2,TOBRATROUGH:2,TOBRAPEAK:2,TOBRARND:2,AMIKACINPEAK:2,AMIKACINTROU:2,AMIKACIN:2, in the last 72 hours   Microbiology: Recent Results (from the past 720 hour(s))  URINE CULTURE     Status: Normal   Collection Time   11/21/10  8:49 PM      Component Value Range Status Comment   Specimen Description URINE, RANDOM   Final    Special Requests NONE   Final    Setup Time [id number]   Final    Colony Count 15,000 COLONIES/ML   Final    Culture     Final    Value: Multiple bacterial morphotypes present, none predominant. Suggest appropriate recollection if clinically indicated.   Report Status 11/23/2010 FINAL   Final   MRSA PCR SCREENING     Status: Abnormal   Collection Time   11/22/10   2:32 AM      Component Value Range Status Comment   MRSA by PCR POSITIVE (*) NEGATIVE  Final   URINE CULTURE     Status: Normal   Collection Time   11/22/10  4:26 AM      Component Value Range Status Comment   Specimen Description URINE, CATHETERIZED   Final    Special Requests NONE   Final    Setup Time [id number]   Final    Colony Count >=100,000 COLONIES/ML   Final    Culture     Final    Value: GROUP B STREP(S.AGALACTIAE)ISOLATED     Note: TESTING AGAINST S. AGALACTIAE NOT ROUTINELY PERFORMED DUE TO PREDICTABILITY OF AMP/PEN/VAN SUSCEPTIBILITY.   Report Status 11/23/2010 FINAL   Final   CULTURE, BLOOD (ROUTINE X 2)     Status: Normal   Collection Time   11/23/10  1:30 AM      Component Value Range Status Comment   Specimen Description BLOOD RIGHT ARM   Final    Special Requests BOTTLES DRAWN AEROBIC AND ANAEROBIC 10CC EA   Final    Setup Time [id number]   Final    Culture NO GROWTH 5 DAYS   Final    Report Status 11/29/2010 FINAL   Final   CULTURE, BLOOD (ROUTINE X 2)     Status: Normal   Collection Time   11/23/10  1:33 AM  Component Value Range Status Comment   Specimen Description BLOOD LEFT HAND   Final    Special Requests     Final    Value: BOTTLES DRAWN AEROBIC AND ANAEROBIC 10CC BLUE 5CC RED   Setup Time [id number]   Final    Culture NO GROWTH 5 DAYS   Final    Report Status 11/29/2010 FINAL   Final   URINE CULTURE     Status: Normal   Collection Time   12/13/10  4:25 AM      Component Value Range Status Comment   Specimen Description URINE, CATHETERIZED   Final    Special Requests NONE   Final    Setup Time [id number]   Final    Colony Count >=100,000 COLONIES/ML   Final    Culture ENTEROCOCCUS SPECIES   Final    Report Status 12/15/2010 FINAL   Final    Organism ID, Bacteria ENTEROCOCCUS SPECIES   Final   PATHOLOGIST SMEAR REVIEW     Status: Normal   Collection Time   12/13/10  3:28 PM      Component Value Range Status Comment   Tech Review  MILD TO MODERATE ANEMIA WITH NORMAL   Final   CULTURE, BLOOD (ROUTINE X 2)     Status: Normal (Preliminary result)   Collection Time   12/16/10  1:32 AM      Component Value Range Status Comment   Specimen Description BLOOD LEFT HAND   Final    Special Requests BOTTLES DRAWN AEROBIC ONLY 5CC   Final    Setup Time [id number]   Final    Culture     Final    Value:        BLOOD CULTURE RECEIVED NO GROWTH TO DATE CULTURE WILL BE HELD FOR 5 DAYS BEFORE ISSUING A FINAL NEGATIVE REPORT   Report Status PENDING   Incomplete   CULTURE, BLOOD (ROUTINE X 2)     Status: Normal (Preliminary result)   Collection Time   12/16/10  1:36 AM      Component Value Range Status Comment   Specimen Description BLOOD LEFT ARM   Final    Special Requests BOTTLES DRAWN AEROBIC AND ANAEROBIC [facility] EACH   Final    Setup Time [id number]   Final    Culture     Final    Value:        BLOOD CULTURE RECEIVED NO GROWTH TO DATE CULTURE WILL BE HELD FOR 5 DAYS BEFORE ISSUING A FINAL NEGATIVE REPORT   Report Status PENDING   Incomplete     Medical History: Past Medical History  Diagnosis Date  . Anemia     NOS  . Personal history of colonic polyps   . Diabetes mellitus     type II  . GERD (gastroesophageal reflux disease)   . Hyperlipidemia   . Hypertension   . Hypothyroidism   . Osteoarthritis   . Osteopenia   . Urinary incontinence   . Pulmonary hypertension   . Anxiety   . Renal insufficiency   . Coronary artery disease   . Aortic stenosis     Medications:  Scheduled:    . alteplase  2 mg Intracatheter Once  . alteplase  2 mg Intracatheter Once  . amLODipine  10 mg Oral Daily  . calcium carbonate  1 tablet Oral TID WC  . cloNIDine  0.2 mg Oral TID  . darbepoetin (ARANESP) injection - DIALYSIS  100 mcg Intravenous Q Tue-HD  .  estradiol  0.5 mg Oral Daily  . feeding supplement  30 mL Oral BID  . ferrous fumarate  1 tablet Oral Daily  . folic acid  0.5 mg Oral Daily  . insulin aspart  0-15 Units  Subcutaneous TID WC  . insulin aspart  0-5 Units Subcutaneous QHS  . levothyroxine  37.5 mcg Oral Custom  . levothyroxine  75 mcg Oral Custom  . losartan  50 mg Oral Daily  . magic mouthwash  5 mL Oral QID  . metoprolol  75 mg Oral BID  . multivitamin  1 tablet Oral QHS  . paricalcitol  1 mcg Intravenous 3 times weekly  . polyethylene glycol  17 g Oral Daily  . simvastatin  20 mg Oral QHS  . vancomycin  500 mg Intravenous Q T,Th,Sa-HD  . DISCONTD: pantoprazole  40 mg Oral Q1200   Assessment: Ms. Sonny Masters is an 77 year old female who presented to the hospital on 12/7 with complaints of confusion and lethargy. She is undergoing HD during this visit and is a new start scheduled for regular HD Tu/Th/Sa. Today is day #4 of vancomycin therapy for enterococcal UTI. Patient did not undergo HD session yesterday but is in HD right now and will receive regularly scheduled HD 12/15. Of note, her mentation has become much better 2/2 correction of acute renal failure and treatment of enterococcal UTI.  Goal of Therapy:  Pre-HD vancomycin level 15-25, alleviation of infection  Plan:  1. Continue vancomycin 500 mg IV with each dialysis 2. F/U duration of therapy, which would be an expected ~7 days due to the prompt resolution of symptoms in this patient   [name], [name] 12/19/2010,2:05 PM

## 2010-12-19 NOTE — Progress Notes (Signed)
PROGRESS NOTE  [name]:[id number] DOB: 04-Sep-1920 DOA: 12/12/2010 PCP: Tillman Abide, MD, MD  Brief narrative:  77 y.o. woman who presented with increased confusion and lethargy.  She was last seen normal on Dec 6th, and the son reports that patient has been eating and drinking well, he states that she has been undergoing a renal workup due to worsening of her kidney numbers over the past few months. He states her creatinine had been 7.6, and she is undergoing a workup by renal, and had a renal biopsy and they are awaiting the results for causes of her renal problems, before the options are to be discussed such as dialysis. She has been seen by Dr. Allena Katz of [facility], and a renal specialist [facility]. Her ED workup revealed a UTI, and a BUN of >140, and a Creatinine of 6.4.   Past medical history: Anemia NOS, Personal history of colonic polyps, Diabetes mellitus type II, GERD (gastroesophageal reflux disease), Hyperlipidemia, Hypertension, Hypothyroidism, Osteoarthritis, Osteopenia, Urinary incontinence, Pulmonary hypertension, Anxiety, Renal insufficiency, Coronary artery disease, Aortic stenosis.   Consultants:   Nephrology   Vascular surgery  Physical therapy: Skilled nursing facility recommended. Equipment recommendations deferred.  Procedures:   Hemodialysis  December 13: Insertion of dialysis catheter  December 12: Platelet transfusion  Interim History: Interval documentation reviewed. Blood pressure high. Nephrology has added losartan. Subjective: No complaints, except for some vaginal irritation secondary to incontinence.  Objective:  Filed Vitals:   12/19/10 0910 12/19/10 0930 12/19/10 0945 12/19/10 1106  BP: 152/75 154/83 171/67 168/73  Pulse: 116 110 104 97  Temp:   98.4 F (36.9 C)   TempSrc:   Oral   Resp: 28 25 22    Height:      Weight:   49 kg (108 lb 0.4 oz)   SpO2:   97%     Intake/Output Summary (Last 24 hours) at 12/19/10 1257 Last  data filed at 12/19/10 0945  Gross per 24 hour  Intake    200 ml  Output   2062 ml  Net  -1862 ml    Exam: General: Appears well. Cardiovascular: Regular rate and rhythm. No murmur, rub or gallop. Respiratory: Clear to auscultation bilaterally. No wheezes, rales or rhonchi. Normal respiratory effort. Psychiatric: Grossly normal mood and affect. Speech fluent and appropriate.  Exam current as of December 14.  Data Reviewed: Basic Metabolic Panel:  Lab 12/19/10 8119 12/18/10 1726 12/17/10 0650 12/16/10 1341 12/16/10 0543 12/15/10 0500  NA 135 -- 135 133* 133* 139  K 3.6 3.5 -- -- -- --  CL 99 -- 98 98 96 100  CO2 26 -- 23 25 24 25   GLUCOSE 206* -- 145* 274* 161* 82  BUN 43* -- 43* 35* 59* 82*  CREATININE 4.02* -- 3.50* 2.82* 3.85* 4.71*  CALCIUM 8.0* -- 7.6* 7.2* 7.5* 7.5*  MG -- -- -- -- -- --  PHOS 5.5* -- -- -- 3.6 3.9   CBC:  Lab 12/19/10 0718 12/18/10 0830 12/17/10 0650 12/16/10 1341 12/16/10 0513 12/12/10 1533  WBC 5.7 5.1 5.1 6.5 4.4 --  NEUTROABS -- -- -- -- -- 12.0*  HGB 11.6* 11.4* 10.9* 10.8* 11.4* --  HCT 34.3* 33.4* 33.4* 32.3* 33.3* --  MCV 86.0 85.4 86.3 86.1 85.2 --  PLT 78* 90* 59* 53* 57* --   CBG:  Lab 12/19/10 1107 12/18/10 2044 12/18/10 1444 12/18/10 1128 12/18/10 0732  GLUCAP 139* 98 125* 119* 123*    Recent Results (from the past 240  hour(s))  URINE CULTURE     Status: Normal   Collection Time   12/13/10  4:25 AM      Component Value Range Status Comment   Specimen Description URINE, CATHETERIZED   Final    Special Requests NONE   Final    Setup Time [id number]   Final    Colony Count >=100,000 COLONIES/ML   Final    Culture ENTEROCOCCUS SPECIES   Final    Report Status 12/15/2010 FINAL   Final    Organism ID, Bacteria ENTEROCOCCUS SPECIES   Final   PATHOLOGIST SMEAR REVIEW     Status: Normal   Collection Time   12/13/10  3:28 PM      Component Value Range Status Comment   Tech Review MILD TO MODERATE ANEMIA WITH NORMAL   Final     CULTURE, BLOOD (ROUTINE X 2)     Status: Normal (Preliminary result)   Collection Time   12/16/10  1:32 AM      Component Value Range Status Comment   Specimen Description BLOOD LEFT HAND   Final    Special Requests BOTTLES DRAWN AEROBIC ONLY 5CC   Final    Setup Time [id number]   Final    Culture     Final    Value:        BLOOD CULTURE RECEIVED NO GROWTH TO DATE CULTURE WILL BE HELD FOR 5 DAYS BEFORE ISSUING A FINAL NEGATIVE REPORT   Report Status PENDING   Incomplete   CULTURE, BLOOD (ROUTINE X 2)     Status: Normal (Preliminary result)   Collection Time   12/16/10  1:36 AM      Component Value Range Status Comment   Specimen Description BLOOD LEFT ARM   Final    Special Requests BOTTLES DRAWN AEROBIC AND ANAEROBIC [facility] EACH   Final    Setup Time [id number]   Final    Culture     Final    Value:        BLOOD CULTURE RECEIVED NO GROWTH TO DATE CULTURE WILL BE HELD FOR 5 DAYS BEFORE ISSUING A FINAL NEGATIVE REPORT   Report Status PENDING   Incomplete    Scheduled Meds:    . alteplase  2 mg Intracatheter Once  . alteplase  2 mg Intracatheter Once  . amLODipine  10 mg Oral Daily  . calcium carbonate  1 tablet Oral TID WC  . cloNIDine  0.2 mg Oral TID  . darbepoetin (ARANESP) injection - DIALYSIS  100 mcg Intravenous Q Tue-HD  . estradiol  0.5 mg Oral Daily  . feeding supplement  30 mL Oral BID  . ferrous fumarate  1 tablet Oral Daily  . folic acid  0.5 mg Oral Daily  . insulin aspart  0-15 Units Subcutaneous TID WC  . insulin aspart  0-5 Units Subcutaneous QHS  . levothyroxine  37.5 mcg Oral Custom  . levothyroxine  75 mcg Oral Custom  . losartan  50 mg Oral Daily  . magic mouthwash  5 mL Oral QID  . metoprolol  75 mg Oral BID  . multivitamin  1 tablet Oral QHS  . paricalcitol  1 mcg Intravenous 3 times weekly  . polyethylene glycol  17 g Oral Daily  . simvastatin  20 mg Oral QHS  . vancomycin  500 mg Intravenous Q T,Th,Sa-HD  . DISCONTD: pantoprazole  40 mg Oral  Q1200   Continuous Infusions:    Assessment/Plan: 1. Acute encephalopathy: Resolved. Secondary to  urinary tract infection and renal failure. 2. Urinary tract infection: VRE. Continue vancomycin. Last day December 16. 3. End-stage renal disease: Per nephrology. 4. Thrombocytopenia: May be related to her process causing renal failure. Continue to follow. She is not on prophylactic doses of heparin or Lovenox.  Consider outpatient hematology evaluation. 5. Anemia: Stable. 6. Diabetes mellitus type 2: Improved control. Consider resuming glipizide if she has been on this as an outpatient. Continue sliding scale insulin.   Overall improvement. Anticipate  December 17 when insurance approval obtained.  Code Status: DO NOT RESUSCITATE   Disposition Plan: Skilled nursing facility.   Brendia Sacks, MD  [facility] Pager [phone] 12/19/2010, 12:57 PM    LOS: 7 days

## 2010-12-19 NOTE — Progress Notes (Signed)
Alteplase removed from HD cath.  Both ports aspirate and flush easily.  Will slowly increase blood flow rate on cath.

## 2010-12-19 NOTE — Progress Notes (Signed)
OT Note: Unable to eval pt this morning, pt at dialysis. Will check back later today as time allows.  Lafe Garin  MS, OTR/L Acute Rehab Dept

## 2010-12-19 NOTE — Progress Notes (Signed)
PT  Cancellation Note  Treatment cancelled today due to patient receiving procedure, pt in Hemodialysis.   Treatment cancelled today due to patient's refusal to participate due to fatigue from hemodialysis  [name] L. Quindell Shere DPT [phone] 12/19/2010

## 2010-12-19 NOTE — Progress Notes (Addendum)
Pt seen on HD. AP 170. Vp 110. Dialyzing again today due to catheter problems yest.  Plan HD tomorrow to keep on tts schedule.  Awaiting SNF placement.  Bp high, will add losartan.

## 2010-12-20 ENCOUNTER — Inpatient Hospital Stay (HOSPITAL_COMMUNITY): Payer: Medicare Other

## 2010-12-20 LAB — RENAL FUNCTION PANEL
Albumin: 1.7 g/dL — ABNORMAL LOW (ref 3.5–5.2)
Chloride: 100 mEq/L (ref 96–112)
GFR calc Af Amer: 13 mL/min — ABNORMAL LOW (ref >90)
GFR calc non Af Amer: 11 mL/min — ABNORMAL LOW (ref >90)
Potassium: 3.7 mEq/L (ref 3.5–5.1)
Sodium: 135 mEq/L (ref 135–145)

## 2010-12-20 LAB — CBC
HCT: 32.2 % — ABNORMAL LOW (ref 36.0–46.0)
Hemoglobin: 10.3 g/dL — ABNORMAL LOW (ref 12.0–15.0)
MCHC: 32.6 g/dL (ref 30.0–36.0)
Platelets: 69 10*3/uL — ABNORMAL LOW (ref 150–400)
Platelets: 68 10*3/uL — ABNORMAL LOW (ref 150–400)
RDW: 14.3 % (ref 11.5–15.5)
RDW: 14.3 % (ref 11.5–15.5)
WBC: 6.2 10*3/uL (ref 4.0–10.5)

## 2010-12-20 LAB — GLUCOSE, CAPILLARY: Glucose-Capillary: 155 mg/dL — ABNORMAL HIGH (ref 70–99)

## 2010-12-20 NOTE — Progress Notes (Signed)
PROGRESS NOTE  [name] EAV:[id number] DOB: Jun 26, 1920 DOA: 12/12/2010 PCP: Tillman Abide, MD, MD  Brief narrative:  77 y.o. woman who presented with increased confusion and lethargy.  She was last seen normal on Dec 6th, and the son reports that patient has been eating and drinking well, he states that she has been undergoing a renal workup due to worsening of her kidney numbers over the past few months. He states her creatinine had been 7.6, and she is undergoing a workup by renal, and had a renal biopsy and they are awaiting the results for causes of her renal problems, before the options are to be discussed such as dialysis. She has been seen by Dr. Allena Katz of [facility], and a renal specialist [facility]. Her ED workup revealed a UTI, and a BUN of >140, and a Creatinine of 6.4.   Past medical history: Anemia NOS, Personal history of colonic polyps, Diabetes mellitus type II, GERD (gastroesophageal reflux disease), Hyperlipidemia, Hypertension, Hypothyroidism, Osteoarthritis, Osteopenia, Urinary incontinence, Pulmonary hypertension, Anxiety, Renal insufficiency, Coronary artery disease, Aortic stenosis.   Consultants:   Nephrology   Vascular surgery  Physical therapy: Skilled nursing facility recommended. Equipment recommendations deferred.  Procedures:   Hemodialysis  December 13: Insertion of dialysis catheter  December 12: Platelet transfusion  Antibiotics:  December 11: Vancomycin  Interim History: Interval documentation reviewed. Patient has received that offers. Patient with some constipation requiring disimpaction. Also noted to have some excoriation on her bottom. Blood cultures no growth today. Subjective: No complaints.  Objective:  Filed Vitals:   12/20/10 0930 12/20/10 1000 12/20/10 1030 12/20/10 1100  BP: 136/65 115/66 113/60 114/68  Pulse: 100 107 112 104  Temp:    97.8 F (36.6 C)  TempSrc:    Oral  Resp: 27 27 27 20   Height:      Weight:     46.8 kg (103 lb 2.8 oz)  SpO2:    96%    Intake/Output Summary (Last 24 hours) at 12/20/10 1356 Last data filed at 12/20/10 1100  Gross per 24 hour  Intake    240 ml  Output   2300 ml  Net  -2060 ml    Exam: General: Appears well. Cardiovascular: Regular rate and rhythm. No murmur, rub or gallop. Respiratory: Clear to auscultation bilaterally. No wheezes, rales or rhonchi. Normal respiratory effort.  Data Reviewed: Basic Metabolic Panel:  Lab 12/20/10 7829 12/19/10 0719 12/17/10 0650 12/16/10 1341 12/16/10 0543 12/15/10 0500  NA 135 135 135 133* 133* --  K 3.7 3.6 -- -- -- --  CL 100 99 98 98 96 --  CO2 27 26 23 25 24  --  GLUCOSE 130* 206* 145* 274* 161* --  BUN 34* 43* 43* 35* 59* --  CREATININE 3.35* 4.02* 3.50* 2.82* 3.85* --  CALCIUM 8.2* 8.0* 7.6* 7.2* 7.5* --  MG -- -- -- -- -- --  PHOS 2.8 5.5* -- -- 3.6 3.9   CBC:  Lab 12/20/10 0748 12/20/10 0500 12/19/10 0718 12/18/10 0830 12/17/10 0650  WBC 6.2 6.4 5.7 5.1 5.1  NEUTROABS -- -- -- -- --  HGB 10.5* 10.3* 11.6* 11.4* 10.9*  HCT 32.2* 31.6* 34.3* 33.4* 33.4*  MCV 88.0 88.0 86.0 85.4 86.3  PLT 68* 69* 78* 90* 59*   CBG:  Lab 12/19/10 2151 12/19/10 1619 12/19/10 1107 12/18/10 2044 12/18/10 1444  GLUCAP 174* 211* 139* 98 125*    Recent Results (from the past 240 hour(s))  URINE CULTURE     Status:  Normal   Collection Time   12/13/10  4:25 AM      Component Value Range Status Comment   Specimen Description URINE, CATHETERIZED   Final    Special Requests NONE   Final    Setup Time [id number]   Final    Colony Count >=100,000 COLONIES/ML   Final    Culture ENTEROCOCCUS SPECIES   Final    Report Status 12/15/2010 FINAL   Final    Organism ID, Bacteria ENTEROCOCCUS SPECIES   Final   PATHOLOGIST SMEAR REVIEW     Status: Normal   Collection Time   12/13/10  3:28 PM      Component Value Range Status Comment   Tech Review MILD TO MODERATE ANEMIA WITH NORMAL   Final   CULTURE, BLOOD (ROUTINE X 2)     Status:  Normal (Preliminary result)   Collection Time   12/16/10  1:32 AM      Component Value Range Status Comment   Specimen Description BLOOD LEFT HAND   Final    Special Requests BOTTLES DRAWN AEROBIC ONLY 5CC   Final    Setup Time [id number]   Final    Culture     Final    Value:        BLOOD CULTURE RECEIVED NO GROWTH TO DATE CULTURE WILL BE HELD FOR 5 DAYS BEFORE ISSUING A FINAL NEGATIVE REPORT   Report Status PENDING   Incomplete   CULTURE, BLOOD (ROUTINE X 2)     Status: Normal (Preliminary result)   Collection Time   12/16/10  1:36 AM      Component Value Range Status Comment   Specimen Description BLOOD LEFT ARM   Final    Special Requests BOTTLES DRAWN AEROBIC AND ANAEROBIC [facility] EACH   Final    Setup Time [id number]   Final    Culture     Final    Value:        BLOOD CULTURE RECEIVED NO GROWTH TO DATE CULTURE WILL BE HELD FOR 5 DAYS BEFORE ISSUING A FINAL NEGATIVE REPORT   Report Status PENDING   Incomplete    Scheduled Meds:    . alteplase  2 mg Intracatheter Once  . amLODipine  10 mg Oral Daily  . calcium carbonate  1 tablet Oral TID WC  . cloNIDine  0.2 mg Oral TID  . darbepoetin (ARANESP) injection - DIALYSIS  100 mcg Intravenous Q Tue-HD  . estradiol  0.5 mg Oral Daily  . feeding supplement  30 mL Oral BID  . ferrous fumarate  1 tablet Oral Daily  . folic acid  0.5 mg Oral Daily  . insulin aspart  0-15 Units Subcutaneous TID WC  . insulin aspart  0-5 Units Subcutaneous QHS  . levothyroxine  37.5 mcg Oral Custom  . levothyroxine  75 mcg Oral Custom  . losartan  50 mg Oral Daily  . magic mouthwash  5 mL Oral QID  . metoprolol  75 mg Oral BID  . multivitamin  1 tablet Oral QHS  . paricalcitol  1 mcg Intravenous 3 times weekly  . polyethylene glycol  17 g Oral Daily  . simvastatin  20 mg Oral QHS  . vancomycin  500 mg Intravenous Q T,Th,Sa-HD   Continuous Infusions:    Assessment/Plan: 1. Urinary tract infection: VRE. Continue vancomycin. Last day December  17. 2. Thrombocytopenia: May be related to her process causing renal failure. Continue to follow. She is not on prophylactic doses of heparin  or Lovenox.  Consider outpatient hematology evaluation. 3. End-stage renal disease: Per nephrology. 4. Diabetes mellitus type 2: Improved control. Consider resuming glipizide if she has been on this as an outpatient. Continue sliding scale insulin.  5. Anemia: Stable. 6. Status post acute encephalopathy: Resolved. Secondary to urinary tract infection and renal failure.  Loose stools with some skin breakdown of perineum secondary to incontinence of bowel and bladder. Barrier cream. Avoid Foley catheter possible.   Overall improvement. Anticipate transfer to skilled facility December 17.  Code Status: DO NOT RESUSCITATE   Disposition Plan: Skilled nursing facility.   Brendia Sacks, MD  [facility] Pager [phone] 12/20/2010, 1:56 PM    LOS: 8 days

## 2010-12-20 NOTE — Progress Notes (Signed)
Bed offers given to son as Pt was sleeping.  Milus Banister MSW,LCSW w/e Coverage [phone]

## 2010-12-20 NOTE — Progress Notes (Signed)
Pt seen on HD. Ap 220.  Vp 160 . SBP 115  On 4K bath

## 2010-12-20 NOTE — Progress Notes (Signed)
PT ARRIVED FROM HD. PT HAS OOZING LIQUID STOOL. RN FELT IN RECTAL AREA AND CAN FEEL SOME HARD STOOL.  RN CALLED MD TO SEE IF HE COULD WRITE AN ORDER FOR DISIMPACTION-WANTED TO SEE IF COULD PLACE A RECTAL TUBE ALSO AND POSSIBLE FOLEY. QUESTION IF NEEDS WOUND CARE CONSULT. PT SKIN IS EXCORIATED AND SOME AREAS OPEN. RN WILL CONTINUE TO MONITOR. Gala Lewandowsky, RN,BSN

## 2010-12-21 LAB — CBC
MCHC: 31.7 g/dL (ref 30.0–36.0)
Platelets: 59 10*3/uL — ABNORMAL LOW (ref 150–400)
RDW: 14.5 % (ref 11.5–15.5)
WBC: 5.4 10*3/uL (ref 4.0–10.5)

## 2010-12-21 LAB — GLUCOSE, CAPILLARY
Glucose-Capillary: 80 mg/dL (ref 70–99)
Glucose-Capillary: 122 mg/dL — ABNORMAL HIGH (ref 70–99)
Glucose-Capillary: 192 mg/dL — ABNORMAL HIGH (ref 70–99)
Glucose-Capillary: 224 mg/dL — ABNORMAL HIGH (ref 70–99)
Glucose-Capillary: 223 mg/dL — ABNORMAL HIGH (ref 70–99)

## 2010-12-21 MED ORDER — DEXTROSE 5 % IV SOLN
1.5000 g | INTRAVENOUS | Status: AC
  Administered 2010-12-22: 1.5 g via INTRAVENOUS
  Filled 2010-12-21 (×2): qty 1.5

## 2010-12-21 NOTE — Progress Notes (Signed)
PROGRESS NOTE  [name] ZOX:[id number] DOB: May 13, 1920 DOA: 12/12/2010 PCP: Tillman Abide, MD, MD  Brief narrative:  77 y.o. woman who presented with increased confusion and lethargy.  She was last seen normal on Dec 6th, and the son reports that patient has been eating and drinking well, he states that she has been undergoing a renal workup due to worsening of her kidney numbers over the past few months. He states her creatinine had been 7.6, and she is undergoing a workup by renal, and had a renal biopsy and they are awaiting the results for causes of her renal problems, before the options are to be discussed such as dialysis. She has been seen by Dr. Allena Katz of [facility], and a renal specialist [facility]. Her ED workup revealed a UTI, and a BUN of >140, and a Creatinine of 6.4.   Past medical history: Anemia NOS, Personal history of colonic polyps, Diabetes mellitus type II, GERD (gastroesophageal reflux disease), Hyperlipidemia, Hypertension, Hypothyroidism, Osteoarthritis, Osteopenia, Urinary incontinence, Pulmonary hypertension, Anxiety, Renal insufficiency, Coronary artery disease, Aortic stenosis.   Consultants:   Nephrology   Vascular surgery  Physical therapy: Skilled nursing facility recommended. Equipment recommendations deferred.  Procedures:   Hemodialysis  December 13: Insertion of dialysis catheter  December 12: Platelet transfusion  Antibiotics:  December 11: Vancomycin  Interim History: Interval documentation reviewed. Bed offers given to son. Subjective: Overall feels that same. She would like a Foley catheter secondary to irritation.  Objective:  Filed Vitals:   12/20/10 1759 12/20/10 2245 12/21/10 0540 12/21/10 1028  BP: 159/80 154/70 133/59 163/73  Pulse: 87 91 83 86  Temp: 97.7 F (36.5 C) 100.4 F (38 C) 99.2 F (37.3 C) 98.6 F (37 C)  TempSrc: Oral Oral Oral Oral  Resp: 21 20 20 19   Height:      Weight:      SpO2: 95% 95% 96% 95%      Intake/Output Summary (Last 24 hours) at 12/21/10 1041 Last data filed at 12/21/10 0900  Gross per 24 hour  Intake    240 ml  Output   2303 ml  Net  -2063 ml    Exam: General: Appears well. Cardiovascular: Regular rate and rhythm. No murmur, rub or gallop. Respiratory: Clear to auscultation bilaterally. No wheezes, rales or rhonchi. Normal respiratory effort.  Telemetry: Sinus rhythm. No acute changes.  Data Reviewed: Basic Metabolic Panel:  Lab 12/20/10 8119 12/19/10 0719 12/17/10 0650 12/16/10 1341 12/16/10 0543 12/15/10 0500  NA 135 135 135 133* 133* --  K 3.7 3.6 -- -- -- --  CL 100 99 98 98 96 --  CO2 27 26 23 25 24  --  GLUCOSE 130* 206* 145* 274* 161* --  BUN 34* 43* 43* 35* 59* --  CREATININE 3.35* 4.02* 3.50* 2.82* 3.85* --  CALCIUM 8.2* 8.0* 7.6* 7.2* 7.5* --  MG -- -- -- -- -- --  PHOS 2.8 5.5* -- -- 3.6 3.9   CBC:  Lab 12/21/10 0615 12/20/10 0748 12/20/10 0500 12/19/10 0718 12/18/10 0830  WBC 5.4 6.2 6.4 5.7 5.1  NEUTROABS -- -- -- -- --  HGB 10.1* 10.5* 10.3* 11.6* 11.4*  HCT 31.9* 32.2* 31.6* 34.3* 33.4*  MCV 89.4 88.0 88.0 86.0 85.4  PLT 59* 68* 69* 78* 90*   CBG:  Lab 12/21/10 0831 12/20/10 2240 12/20/10 1757 12/20/10 1533 12/19/10 2151  GLUCAP 122* 80 162* 155* 174*    Recent Results (from the past 240 hour(s))  URINE CULTURE  Status: Normal   Collection Time   12/13/10  4:25 AM      Component Value Range Status Comment   Specimen Description URINE, CATHETERIZED   Final    Special Requests NONE   Final    Setup Time [id number]   Final    Colony Count >=100,000 COLONIES/ML   Final    Culture ENTEROCOCCUS SPECIES   Final    Report Status 12/15/2010 FINAL   Final    Organism ID, Bacteria ENTEROCOCCUS SPECIES   Final   PATHOLOGIST SMEAR REVIEW     Status: Normal   Collection Time   12/13/10  3:28 PM      Component Value Range Status Comment   Tech Review MILD TO MODERATE ANEMIA WITH NORMAL   Final   CULTURE, BLOOD (ROUTINE X 2)      Status: Normal (Preliminary result)   Collection Time   12/16/10  1:32 AM      Component Value Range Status Comment   Specimen Description BLOOD LEFT HAND   Final    Special Requests BOTTLES DRAWN AEROBIC ONLY 5CC   Final    Setup Time [id number]   Final    Culture     Final    Value:        BLOOD CULTURE RECEIVED NO GROWTH TO DATE CULTURE WILL BE HELD FOR 5 DAYS BEFORE ISSUING A FINAL NEGATIVE REPORT   Report Status PENDING   Incomplete   CULTURE, BLOOD (ROUTINE X 2)     Status: Normal (Preliminary result)   Collection Time   12/16/10  1:36 AM      Component Value Range Status Comment   Specimen Description BLOOD LEFT ARM   Final    Special Requests BOTTLES DRAWN AEROBIC AND ANAEROBIC [facility] EACH   Final    Setup Time [id number]   Final    Culture     Final    Value:        BLOOD CULTURE RECEIVED NO GROWTH TO DATE CULTURE WILL BE HELD FOR 5 DAYS BEFORE ISSUING A FINAL NEGATIVE REPORT   Report Status PENDING   Incomplete    Scheduled Meds:    . alteplase  2 mg Intracatheter Once  . amLODipine  10 mg Oral Daily  . calcium carbonate  1 tablet Oral TID WC  . cefUROXime (ZINACEF)  IV  1.5 g Intravenous 60 min Pre-Op  . cloNIDine  0.2 mg Oral TID  . darbepoetin (ARANESP) injection - DIALYSIS  100 mcg Intravenous Q Tue-HD  . estradiol  0.5 mg Oral Daily  . feeding supplement  30 mL Oral BID  . folic acid  0.5 mg Oral Daily  . insulin aspart  0-15 Units Subcutaneous TID WC  . insulin aspart  0-5 Units Subcutaneous QHS  . levothyroxine  37.5 mcg Oral Custom  . levothyroxine  75 mcg Oral Custom  . losartan  50 mg Oral Daily  . magic mouthwash  5 mL Oral QID  . metoprolol  75 mg Oral BID  . multivitamin  1 tablet Oral QHS  . paricalcitol  1 mcg Intravenous 3 times weekly  . polyethylene glycol  17 g Oral Daily  . simvastatin  20 mg Oral QHS  . vancomycin  500 mg Intravenous Q T,Th,Sa-HD  . DISCONTD: ferrous fumarate  1 tablet Oral Daily   Continuous Infusions:     Assessment/Plan: 1. Urinary tract infection: VRE. Continue vancomycin. Last day December 17. 2. Thrombocytopenia: May be related to her  process causing renal failure. Continue to follow. She is not on prophylactic doses of heparin or Lovenox.  Consider outpatient hematology evaluation. 3. End-stage renal disease: Per nephrology. Vascular access to be placed during this hospitalization. 4. Diabetes mellitus type 2: Improved control. Consider resuming glipizide if she has been on this as an outpatient. Continue sliding scale insulin.  5. Anemia: Stable. 6. Status post acute encephalopathy: Resolved. Secondary to urinary tract infection and renal failure. 7. Skin breakdown: Foley catheter.   Code Status: DO NOT RESUSCITATE  Family communication: Spoke with her son at bedside today. All questions answered to his apparent satisfaction. Disposition Plan: Skilled nursing facility.   Brendia Sacks, MD  [facility] Pager [phone] 12/21/2010, 10:41 AM    LOS: 9 days

## 2010-12-21 NOTE — Progress Notes (Signed)
[facility]  Vein mapping reviewed.  Pt's permanent access options: R BVT, L BVT, possibly L BC AVF.  Pt has expressed reservation with proceeding with access placement.  Please let us when patient is ready to proceed.  Leonides Sake, MD [facility] Office: [phone] Pager: [phone]  12/21/2010, 9:52 AM

## 2010-12-21 NOTE — Progress Notes (Signed)
S:she says she is eating well though son says she isn't.  She tells me  She is agreeable to access placement O:BP 133/59  Pulse 83  Temp(Src) 99.2 F (37.3 C) (Oral)  Resp 20  Ht 5\' 3"  (1.6 m)  Wt 46.8 kg (103 lb 2.8 oz)  BMI 18.28 kg/m2  SpO2 96%  Intake/Output Summary (Last 24 hours) at 12/21/10 1029 Last data filed at 12/21/10 0200  Gross per 24 hour  Intake    120 ml  Output   2303 ml  Net  -2183 ml   Weight change: 0.6 kg (1 lb 5.2 oz) ZOX:WRUEA and alert CVS:RRR Resp:bil crackles, decreased BS bases Abd:Pos BS NTND Ext:tr edema NEURO:ox3 no asterixis      . alteplase  2 mg Intracatheter Once  . amLODipine  10 mg Oral Daily  . calcium carbonate  1 tablet Oral TID WC  . cloNIDine  0.2 mg Oral TID  . darbepoetin (ARANESP) injection - DIALYSIS  100 mcg Intravenous Q Tue-HD  . estradiol  0.5 mg Oral Daily  . feeding supplement  30 mL Oral BID  . ferrous fumarate  1 tablet Oral Daily  . folic acid  0.5 mg Oral Daily  . insulin aspart  0-15 Units Subcutaneous TID WC  . insulin aspart  0-5 Units Subcutaneous QHS  . levothyroxine  37.5 mcg Oral Custom  . levothyroxine  75 mcg Oral Custom  . losartan  50 mg Oral Daily  . magic mouthwash  5 mL Oral QID  . metoprolol  75 mg Oral BID  . multivitamin  1 tablet Oral QHS  . paricalcitol  1 mcg Intravenous 3 times weekly  . polyethylene glycol  17 g Oral Daily  . simvastatin  20 mg Oral QHS  . vancomycin  500 mg Intravenous Q T,Th,Sa-HD   No results found. BMET    Component Value Date/Time   NA 135 12/20/2010 0748   K 3.7 12/20/2010 0748   CL 100 12/20/2010 0748   CO2 27 12/20/2010 0748   GLUCOSE 130* 12/20/2010 0748   GLUCOSE 144* 01/13/2006 0000   BUN 34* 12/20/2010 0748   CREATININE 3.35* 12/20/2010 0748   CALCIUM 8.2* 12/20/2010 0748   GFRNONAA 11* 12/20/2010 0748   GFRAA 13* 12/20/2010 0748   CBC    Component Value Date/Time   WBC 5.4 12/21/2010 0615   RBC 3.57* 12/21/2010 0615   HGB 10.1* 12/21/2010  0615   HCT 31.9* 12/21/2010 0615   PLT 59* 12/21/2010 0615   MCV 89.4 12/21/2010 0615   MCH 28.3 12/21/2010 0615   MCHC 31.7 12/21/2010 0615   RDW 14.5 12/21/2010 0615   LYMPHSABS 0.2* 12/12/2010 1533   MONOABS 0.1 12/12/2010 1533   EOSABS 0.0 12/12/2010 1533   BASOSABS 0.0 12/12/2010 1533     Assessment:  1. ESRD sec pauci immune GN 2. Vol Overload, improved 3. Anemia 4. Sec HPTH   Plan: 1. Spoke with Dr Imogene Burn about getting an access in this hosp either mon or tues 2. Son looking into SNF 3. Plan HD tues Cassi Jenne T

## 2010-12-22 ENCOUNTER — Encounter (HOSPITAL_COMMUNITY): Payer: Self-pay | Admitting: Anesthesiology

## 2010-12-22 ENCOUNTER — Inpatient Hospital Stay (HOSPITAL_COMMUNITY): Payer: Medicare Other | Admitting: Anesthesiology

## 2010-12-22 ENCOUNTER — Encounter (HOSPITAL_COMMUNITY)
Admission: EM | Disposition: A | Discharge status: Skilled Nursing Facility | Payer: Self-pay | Source: Home / Self Care | Attending: Family Medicine

## 2010-12-22 DIAGNOSIS — N186 End stage renal disease: Secondary | ICD-10-CM

## 2010-12-22 DIAGNOSIS — D631 Anemia in chronic kidney disease: Secondary | ICD-10-CM | POA: Diagnosis present

## 2010-12-22 HISTORY — PX: AV FISTULA PLACEMENT: SHX1204

## 2010-12-22 LAB — CULTURE, BLOOD (ROUTINE X 2)
Culture  Setup Time: 201212110846
Culture: NO GROWTH
Culture: NO GROWTH

## 2010-12-22 LAB — CBC
MCV: 87.8 fL (ref 78.0–100.0)
Platelets: 73 10*3/uL — ABNORMAL LOW (ref 150–400)
RBC: 3.62 MIL/uL — ABNORMAL LOW (ref 3.87–5.11)
RDW: 14.3 % (ref 11.5–15.5)
WBC: 5.1 10*3/uL (ref 4.0–10.5)

## 2010-12-22 LAB — BASIC METABOLIC PANEL
CO2: 27 mEq/L (ref 19–32)
Chloride: 96 mEq/L (ref 96–112)
Creatinine, Ser: 3.57 mg/dL — ABNORMAL HIGH (ref 0.50–1.10)
GFR calc Af Amer: 12 mL/min — ABNORMAL LOW (ref >90)
Potassium: 4.1 mEq/L (ref 3.5–5.1)
Sodium: 131 mEq/L — ABNORMAL LOW (ref 135–145)

## 2010-12-22 LAB — GLUCOSE, CAPILLARY
Glucose-Capillary: 110 mg/dL — ABNORMAL HIGH (ref 70–99)
Glucose-Capillary: 237 mg/dL — ABNORMAL HIGH (ref 70–99)
Glucose-Capillary: 216 mg/dL — ABNORMAL HIGH (ref 70–99)

## 2010-12-22 LAB — SURGICAL PCR SCREEN
MRSA, PCR: NEGATIVE
Staphylococcus aureus: NEGATIVE

## 2010-12-22 SURGERY — ARTERIOVENOUS (AV) FISTULA CREATION
Anesthesia: Monitor Anesthesia Care | Site: Arm Upper | Laterality: Left | Wound class: Clean

## 2010-12-22 MED ORDER — HEPARIN SODIUM (PORCINE) 1000 UNIT/ML DIALYSIS
100.0000 [IU]/kg | INTRAMUSCULAR | Status: DC | PRN
  Administered 2010-12-23: 4900 [IU] via INTRAVENOUS_CENTRAL
  Filled 2010-12-22: qty 5

## 2010-12-22 MED ORDER — BUPIVACAINE HCL (PF) 0.5 % IJ SOLN
INTRAMUSCULAR | Status: DC | PRN
  Administered 2010-12-22: 2 mL

## 2010-12-22 MED ORDER — AMLODIPINE BESYLATE 10 MG PO TABS
10.0000 mg | ORAL_TABLET | Freq: Every day | ORAL | Status: DC
  Administered 2010-12-23 – 2010-12-28 (×6): 10 mg via ORAL
  Filled 2010-12-22 (×7): qty 1

## 2010-12-22 MED ORDER — FENTANYL CITRATE 0.05 MG/ML IJ SOLN: 25.0000 ug | INTRAMUSCULAR | Status: DC | PRN

## 2010-12-22 MED ORDER — MEPERIDINE HCL 25 MG/ML IJ SOLN: 6.2500 mg | INTRAMUSCULAR | Status: DC | PRN

## 2010-12-22 MED ORDER — LIDOCAINE-EPINEPHRINE (PF) 1 %-1:200000 IJ SOLN
INTRAMUSCULAR | Status: DC | PRN
  Administered 2010-12-22: 2 mL

## 2010-12-22 MED ORDER — HEPARIN SODIUM (PORCINE) 1000 UNIT/ML DIALYSIS
40.0000 [IU]/kg | Freq: Once | INTRAMUSCULAR | Status: DC
  Filled 2010-12-22: qty 2

## 2010-12-22 MED ORDER — PROPOFOL 10 MG/ML IV EMUL
INTRAVENOUS | Status: DC | PRN
  Administered 2010-12-22: 25 ug/kg/min via INTRAVENOUS

## 2010-12-22 MED ORDER — SODIUM CHLORIDE 0.9 % IV SOLN
INTRAVENOUS | Status: DC | PRN
  Administered 2010-12-22: 11:00:00 via INTRAVENOUS

## 2010-12-22 MED ORDER — LOSARTAN POTASSIUM 50 MG PO TABS
50.0000 mg | ORAL_TABLET | Freq: Every day | ORAL | Status: DC
  Administered 2010-12-23: 50 mg via ORAL
  Filled 2010-12-22 (×2): qty 1

## 2010-12-22 MED ORDER — DEXTROSE 5 % IV SOLN
INTRAVENOUS | Status: DC | PRN
  Administered 2010-12-22: 11:00:00 via INTRAVENOUS

## 2010-12-22 MED ORDER — SODIUM CHLORIDE 0.9 % IR SOLN
Status: DC | PRN
  Administered 2010-12-22: 11:00:00

## 2010-12-22 MED ORDER — SODIUM CHLORIDE 0.9 % IV SOLN
INTRAVENOUS | Status: DC
  Administered 2010-12-22: 10:00:00 via INTRAVENOUS

## 2010-12-22 MED ORDER — PROMETHAZINE HCL 25 MG/ML IJ SOLN: 6.2500 mg | INTRAMUSCULAR | Status: DC | PRN

## 2010-12-22 MED ORDER — HEPARIN SODIUM (PORCINE) 1000 UNIT/ML DIALYSIS
100.0000 [IU]/kg | INTRAMUSCULAR | Status: DC | PRN
  Filled 2010-12-22: qty 5

## 2010-12-22 MED ORDER — SODIUM CHLORIDE 0.9 % IR SOLN
Status: DC | PRN
  Administered 2010-12-22: 1000 mL

## 2010-12-22 SURGICAL SUPPLY — 37 items
CANISTER SUCTION 2500CC (MISCELLANEOUS) ×2 IMPLANT
CLIP TI MEDIUM 6 (CLIP) ×2 IMPLANT
CLIP TI WIDE RED SMALL 6 (CLIP) ×2 IMPLANT
CLOTH BEACON ORANGE TIMEOUT ST (SAFETY) ×2 IMPLANT
COVER PROBE W GEL 5X96 (DRAPES) IMPLANT
COVER SURGICAL LIGHT HANDLE (MISCELLANEOUS) ×4 IMPLANT
DECANTER SPIKE VIAL GLASS SM (MISCELLANEOUS) ×2 IMPLANT
DERMABOND ADHESIVE PROPEN (GAUZE/BANDAGES/DRESSINGS) ×1
DERMABOND ADVANCED (GAUZE/BANDAGES/DRESSINGS) ×1
DERMABOND ADVANCED .7 DNX12 (GAUZE/BANDAGES/DRESSINGS) ×1 IMPLANT
DERMABOND ADVANCED .7 DNX6 (GAUZE/BANDAGES/DRESSINGS) ×1 IMPLANT
DRAIN PENROSE 1/2X12 LTX STRL (WOUND CARE) IMPLANT
ELECT REM PT RETURN 9FT ADLT (ELECTROSURGICAL) ×2
ELECTRODE REM PT RTRN 9FT ADLT (ELECTROSURGICAL) ×1 IMPLANT
GLOVE BIO SURGEON STRL SZ 6.5 (GLOVE) ×4 IMPLANT
GLOVE BIO SURGEON STRL SZ7 (GLOVE) ×2 IMPLANT
GLOVE BIOGEL PI IND STRL 7.0 (GLOVE) ×1 IMPLANT
GLOVE BIOGEL PI IND STRL 7.5 (GLOVE) ×2 IMPLANT
GLOVE BIOGEL PI INDICATOR 7.0 (GLOVE) ×1
GLOVE BIOGEL PI INDICATOR 7.5 (GLOVE) ×2
GLOVE SURG SS PI 7.5 STRL IVOR (GLOVE) ×2 IMPLANT
GOWN STRL NON-REIN LRG LVL3 (GOWN DISPOSABLE) ×4 IMPLANT
KIT BASIN OR (CUSTOM PROCEDURE TRAY) ×2 IMPLANT
KIT ROOM TURNOVER OR (KITS) ×2 IMPLANT
NS IRRIG 1000ML POUR BTL (IV SOLUTION) ×2 IMPLANT
PACK CV ACCESS (CUSTOM PROCEDURE TRAY) ×2 IMPLANT
PAD ARMBOARD 7.5X6 YLW CONV (MISCELLANEOUS) ×4 IMPLANT
SPONGE SURGIFOAM ABS GEL 100 (HEMOSTASIS) IMPLANT
SUT MNCRL AB 4-0 PS2 18 (SUTURE) ×4 IMPLANT
SUT PROLENE 6 0 BV (SUTURE) IMPLANT
SUT PROLENE 7 0 BV 1 (SUTURE) ×4 IMPLANT
SUT VIC AB 3-0 SH 27 (SUTURE) ×1
SUT VIC AB 3-0 SH 27X BRD (SUTURE) ×1 IMPLANT
TOWEL OR 17X24 6PK STRL BLUE (TOWEL DISPOSABLE) ×2 IMPLANT
TOWEL OR 17X26 10 PK STRL BLUE (TOWEL DISPOSABLE) ×2 IMPLANT
UNDERPAD 30X30 INCONTINENT (UNDERPADS AND DIAPERS) ×2 IMPLANT
WATER STERILE IRR 1000ML POUR (IV SOLUTION) ×2 IMPLANT

## 2010-12-22 NOTE — Anesthesia Postprocedure Evaluation (Signed)
  Anesthesia Post-op Note  Patient: [name]  Procedure(s) Performed:  ARTERIOVENOUS (AV) FISTULA CREATION - Creation of Left Brachial-Cephalic Fistula  Patient Location: PACU  Anesthesia Type: MAC  Level of Consciousness: awake  Airway and Oxygen Therapy: Patient Spontanous Breathing and Patient connected to nasal cannula oxygen  Post-op Pain: mild  Post-op Assessment: Post-op Vital signs reviewed, Patient's Cardiovascular Status Stable, Respiratory Function Stable and Patent Airway  Post-op Vital Signs: Reviewed and stable  Complications: No apparent anesthesia complications

## 2010-12-22 NOTE — Anesthesia Preprocedure Evaluation (Addendum)
Anesthesia Evaluation  Patient identified by MRN, date of birth, ID band Patient awake    Reviewed: Allergy & Precautions, H&P , NPO status , Patient's Chart, lab work & pertinent test results, reviewed documented beta blocker date and time   Airway Mallampati: II TM Distance: >3 FB Neck ROM: Full    Dental  (+) Teeth Intact and Missing   Pulmonary neg pulmonary ROS,  clear to auscultation        Cardiovascular hypertension, Pt. on medications and Pt. on home beta blockers + CAD Regular Normal+ Systolic murmurs    Neuro/Psych Negative Neurological ROS     GI/Hepatic Neg liver ROS, GERD-  Controlled,  Endo/Other  Diabetes mellitus-, Well Controlled, Type 2, Insulin DependentHypothyroidism   Renal/GU ESRF and DialysisRenal disease  Genitourinary negative   Musculoskeletal negative musculoskeletal ROS (+)   Abdominal   Peds negative pediatric ROS (+)  Hematology   Anesthesia Other Findings Missing  Some teeth  Reproductive/Obstetrics negative OB ROS                         Anesthesia Physical Anesthesia Plan  ASA: III  Anesthesia Plan: MAC   Post-op Pain Management:    Induction: Intravenous  Airway Management Planned: Simple Face Mask  Additional Equipment:   Intra-op Plan:   Post-operative Plan:   Informed Consent: I have reviewed the patients History and Physical, chart, labs and discussed the procedure including the risks, benefits and alternatives for the proposed anesthesia with the patient or authorized representative who has indicated his/her understanding and acceptance.     Plan Discussed with: CRNA  Anesthesia Plan Comments:         Anesthesia Quick Evaluation

## 2010-12-22 NOTE — Preoperative (Signed)
Beta Blockers   Reason not to administer Beta Blockers:Pt. took Metoprolol at around 6 p.m. last evening.

## 2010-12-22 NOTE — Progress Notes (Signed)
Nutrition Follow-up  Pt reports she is hungry. No current issues with appetite.  Insertion of HD catheter on 12/13.  Eating 25-50%. NPO now for procedure.  Diet Order:  NPO for procedure (Renal 60/70 prior) Supplements: Nepro prn, Prostat BID  Meds: Scheduled Meds:   . alteplase  2 mg Intracatheter Once  . amLODipine  10 mg Oral Daily  . calcium carbonate  1 tablet Oral TID WC  . cefUROXime (ZINACEF)  IV  1.5 g Intravenous 60 min Pre-Op  . cloNIDine  0.2 mg Oral TID  . darbepoetin (ARANESP) injection - DIALYSIS  100 mcg Intravenous Q Tue-HD  . estradiol  0.5 mg Oral Daily  . feeding supplement  30 mL Oral BID  . folic acid  0.5 mg Oral Daily  . insulin aspart  0-15 Units Subcutaneous TID WC  . insulin aspart  0-5 Units Subcutaneous QHS  . levothyroxine  37.5 mcg Oral Custom  . levothyroxine  75 mcg Oral Custom  . losartan  50 mg Oral Daily  . magic mouthwash  5 mL Oral QID  . metoprolol  75 mg Oral BID  . multivitamin  1 tablet Oral QHS  . paricalcitol  1 mcg Intravenous 3 times weekly  . polyethylene glycol  17 g Oral Daily  . simvastatin  20 mg Oral QHS  . DISCONTD: vancomycin  500 mg Intravenous Q T,Th,Sa-HD   Continuous Infusions:   . sodium chloride 10 mL/hr at 12/22/10 1009   PRN Meds:.sodium chloride, acetaminophen, acetaminophen, feeding supplement (NEPRO CARB STEADY), fentaNYL, heparin, heparin, heparin, HYDROmorphone, lidocaine, lidocaine-prilocaine, meperidine, ondansetron (ZOFRAN) IV, ondansetron, oxyCODONE, pentafluoroprop-tetrafluoroeth, promethazine, DISCONTD: bupivacaine, DISCONTD: heparin 6000 unit irrigation, DISCONTD: HYDROmorphone, DISCONTD: lidocaine-EPINEPHrine, DISCONTD: sodium chloride irrigation  Labs:  CMP     Component Value Date/Time   NA 131* 12/22/2010 0500   K 4.1 12/22/2010 0500   CL 96 12/22/2010 0500   CO2 27 12/22/2010 0500   GLUCOSE 133* 12/22/2010 0500   GLUCOSE 144* 01/13/2006 0000   BUN 47* 12/22/2010 0500   CREATININE 3.57*  12/22/2010 0500   CALCIUM 7.8* 12/22/2010 0500   PROT 5.8* 12/14/2010 0650   ALBUMIN 1.7* 12/20/2010 0748   AST 13 12/14/2010 0650   ALT 20 12/14/2010 0650   ALKPHOS 78 12/14/2010 0650   BILITOT 0.5 12/14/2010 0650   GFRNONAA 10* 12/22/2010 0500   GFRAA 12* 12/22/2010 0500     Intake/Output Summary (Last 24 hours) at 12/22/10 1242 Last data filed at 12/22/10 1213  Gross per 24 hour  Intake    270 ml  Output     23 ml  Net    247 ml    Weight Status:  49.1 kg, s/p HD on 12/18. Down 4 kg since HD on 12/10  Nutrition Dx:  Increased nutrient needs - persists.  Goal:  Pt to consume on average >75% of meals/supplements. Unmet.  Intervention:   1. Continue current interventions. Encourage supplements as able.  Monitor:  PO intake, weights, labs, I/O's   [name] Pager #:  [phone]

## 2010-12-22 NOTE — Op Note (Signed)
OPERATIVE NOTE   PROCEDURE: left brachiocephalic arteriovenous fistula placement  PRE-OPERATIVE DIAGNOSIS: end stage renal disease   POST-OPERATIVE DIAGNOSIS: same as above    SURGEON: Leonides Sake, MD  ASSISTANT(S): Autumn Patty, PAC  ANESTHESIA: local and IV sedation  ESTIMATED BLOOD LOSS: 30 cc  FINDING(S): 1.  Palpable thrill in left upper arm at end of case 2.  Palpable right pulse at end of case  SPECIMEN(S):  none  INDICATIONS:   [name] is a 77 y.o. female who presents with end stage renal disease.  The patient is scheduled for left brachiocephalic arteriovenous fistula placement.  The patient is aware the risks include but are not limited to: bleeding, infection, steal syndrome, nerve damage, ischemic monomelic neuropathy, failure to mature, and need for additional procedures.  The patient is aware of the risks of the procedure and elects to proceed forward.  DESCRIPTION: After full informed written consent was obtained from the patient, the patient was brought back to the operating room and placed supine upon the operating table.  Prior to induction, the patient received IV antibiotics.   After obtaining adequate anesthesia, the patient was then prepped and draped in the standard fashion for a left arm access procedure.  I turned my attention first to identifying the patient's cephalic vein and brachial artery.  Using SonoSite guidance, the location of these vessels were marked out on the skin.   At this point, I injected local anesthetic to obtain a field block of the antecubitum.  In total, I injected about 5 mL of a 1:1 mixture of 0.5% Marcaine without epinephrine and 1% lidocaine with epinephrine.  I made a transverse incision at the level of the antecubitum and dissected through the subcutaneous tissue and fascia to gain exposure of the brachial artery.  This was noted to be 3 mm in diameter externally.  This was dissected out proximally and distally and controlled  with vessel loops .  I then dissected out the cephalic vein.  This was noted to be 2-3 mm in diameter externally.  The distal segment of the vein was ligated with a  2-0 silk, and the vein was transected.  The proximal segment was iinterrogated with serial dilators.  The vein accepted up to a 4.5 mm dilator without any difficulty.  I then instilled the heparinized saline into the vein and clamped it.  At this point, I reset my exposure of the brachial artery and placed the artery under tension proximally and distally.  I made an arteriotomy with a #11 blade, and then I extended the arteriotomy with a Potts scissor.  I injected heparinized saline proximal and distal to this arteriotomy.  The vein was then sewn to the artery in an end-to-side configuration with a running stitch of 7-0 Prolene.  Prior to completing this anastomosis, I allowed the vein and artery to backbleed.  There was no evidence of clot from any vessels.  I completed the anastomosis in the usual fashion and then released all vessel loops and clamps.  There was a palpable  thrill in the venous outflow, and there was a palpable radial pulse.  At this point, I irrigated out the surgical wound.  There was no further active bleeding.  The subcutaneous tissue was reapproximated with a running stitch of 3-0 Vicryl.  The skin was then reapproximated with a running subcuticular stitch of 4-0 Vicryl.  The skin was then cleaned, dried, and reinforced with Dermabond.  The patient tolerated this procedure well.  COMPLICATIONS: none  CONDITION: stable  Leonides Sake, MD [facility] [place] Office: [phone] Pager: [phone]  12/22/2010, 11:59 AM

## 2010-12-22 NOTE — Progress Notes (Signed)
PROGRESS NOTE  [name] ZOX:[id number] DOB: [birth year] DOA: 12/12/2010 PCP: Tillman Abide, MD, MD  Brief narrative:  77 y.o. woman who presented with increased confusion and lethargy.  She was last seen normal on Dec 6th, and the son reports that patient has been eating and drinking well, he states that she has been undergoing a renal workup due to worsening of her kidney numbers over the past few months. He states her creatinine had been 7.6, and she is undergoing a workup by renal, and had a renal biopsy and they are awaiting the results for causes of her renal problems, before the options are to be discussed such as dialysis. She has been seen by Dr. Allena Katz of [facility], and a renal specialist [facility]. Her ED workup revealed a UTI, and a BUN of >140, and a Creatinine of 6.4.   Past medical history: Anemia NOS, Personal history of colonic polyps, Diabetes mellitus type II, GERD (gastroesophageal reflux disease), Hyperlipidemia, Hypertension, Hypothyroidism, Osteoarthritis, Osteopenia, Urinary incontinence, Pulmonary hypertension, Anxiety, Renal insufficiency, Coronary artery disease, Aortic stenosis.   Consultants:   Nephrology   Vascular surgery  Physical therapy: Skilled nursing facility recommended. Equipment recommendations deferred.  Procedures:   Hemodialysis  December 13: Insertion of dialysis catheter  December 12: Platelet transfusion  Antibiotics:  December 11: Vancomycin  Interim History: Interval documentation reviewed. Status post surgery today. Subjective: Feels better.  Objective:  Filed Vitals:   12/22/10 1320 12/22/10 1330 12/22/10 1335 12/22/10 1350  BP: 156/53  155/60 163/72  Pulse: 87 91 87 88  Temp:    99.8 F (37.7 C)  TempSrc:      Resp: 24 24 26 27   Height:      Weight:      SpO2: 96% 94% 97% 98%    Intake/Output Summary (Last 24 hours) at 12/22/10 1849 Last data filed at 12/22/10 1330  Gross per 24 hour  Intake    275 ml    Output     23 ml  Net    252 ml    Exam: General: Appears well. Cardiovascular: Regular rate and rhythm. No murmur, rub or gallop. Respiratory: Clear to auscultation bilaterally. No wheezes, rales or rhonchi. Normal respiratory effort.  Exam current December 17.  Data Reviewed: Basic Metabolic Panel:  Lab 12/22/10 8119 12/20/10 0748 12/19/10 0719 12/17/10 0650 12/16/10 1341 12/16/10 0543  NA 131* 135 135 135 133* --  K 4.1 3.7 -- -- -- --  CL 96 100 99 98 98 --  CO2 27 27 26 23 25  --  GLUCOSE 133* 130* 206* 145* 274* --  BUN 47* 34* 43* 43* 35* --  CREATININE 3.57* 3.35* 4.02* 3.50* 2.82* --  CALCIUM 7.8* 8.2* 8.0* 7.6* 7.2* --  MG -- -- -- -- -- --  PHOS -- 2.8 5.5* -- -- 3.6   CBC:  Lab 12/22/10 0500 12/21/10 0615 12/20/10 0748 12/20/10 0500 12/19/10 0718  WBC 5.1 5.4 6.2 6.4 5.7  NEUTROABS -- -- -- -- --  HGB 10.2* 10.1* 10.5* 10.3* 11.6*  HCT 31.8* 31.9* 32.2* 31.6* 34.3*  MCV 87.8 89.4 88.0 88.0 86.0  PLT 73* 59* 68* 69* 78*   CBG:  Lab 12/22/10 1703 12/22/10 1245 12/22/10 0749 12/21/10 2059 12/21/10 1800  GLUCAP 237* 110* 126* 223* 224*    Recent Results (from the past 240 hour(s))  URINE CULTURE     Status: Normal   Collection Time   12/13/10  4:25 AM  Component Value Range Status Comment   Specimen Description URINE, CATHETERIZED   Final    Special Requests NONE   Final    Setup Time [id number]   Final    Colony Count >=100,000 COLONIES/ML   Final    Culture ENTEROCOCCUS SPECIES   Final    Report Status 12/15/2010 FINAL   Final    Organism ID, Bacteria ENTEROCOCCUS SPECIES   Final   PATHOLOGIST SMEAR REVIEW     Status: Normal   Collection Time   12/13/10  3:28 PM      Component Value Range Status Comment   Tech Review MILD TO MODERATE ANEMIA WITH NORMAL   Final   CULTURE, BLOOD (ROUTINE X 2)     Status: Normal   Collection Time   12/16/10  1:32 AM      Component Value Range Status Comment   Specimen Description BLOOD LEFT HAND   Final     Special Requests BOTTLES DRAWN AEROBIC ONLY 5CC   Final    Setup Time [id number]   Final    Culture NO GROWTH 5 DAYS   Final    Report Status 12/22/2010 FINAL   Final   CULTURE, BLOOD (ROUTINE X 2)     Status: Normal   Collection Time   12/16/10  1:36 AM      Component Value Range Status Comment   Specimen Description BLOOD LEFT ARM   Final    Special Requests BOTTLES DRAWN AEROBIC AND ANAEROBIC [facility] EACH   Final    Setup Time [id number]   Final    Culture NO GROWTH 5 DAYS   Final    Report Status 12/22/2010 FINAL   Final   SURGICAL PCR SCREEN     Status: Normal   Collection Time   12/22/10  6:35 AM      Component Value Range Status Comment   MRSA, PCR NEGATIVE  NEGATIVE  Final    Staphylococcus aureus NEGATIVE  NEGATIVE  Final    Scheduled Meds:    . alteplase  2 mg Intracatheter Once  . amLODipine  10 mg Oral QHS  . calcium carbonate  1 tablet Oral TID WC  . cefUROXime (ZINACEF)  IV  1.5 g Intravenous 60 min Pre-Op  . cloNIDine  0.2 mg Oral TID  . darbepoetin (ARANESP) injection - DIALYSIS  100 mcg Intravenous Q Tue-HD  . estradiol  0.5 mg Oral Daily  . feeding supplement  30 mL Oral BID  . heparin  40 Units/kg Dialysis Once in dialysis  . insulin aspart  0-15 Units Subcutaneous TID WC  . insulin aspart  0-5 Units Subcutaneous QHS  . levothyroxine  37.5 mcg Oral Custom  . levothyroxine  75 mcg Oral Custom  . losartan  50 mg Oral QHS  . magic mouthwash  5 mL Oral QID  . metoprolol  75 mg Oral BID  . multivitamin  1 tablet Oral QHS  . paricalcitol  1 mcg Intravenous 3 times weekly  . polyethylene glycol  17 g Oral Daily  . simvastatin  20 mg Oral QHS  . DISCONTD: amLODipine  10 mg Oral Daily  . DISCONTD: folic acid  0.5 mg Oral Daily  . DISCONTD: losartan  50 mg Oral Daily  . DISCONTD: vancomycin  500 mg Intravenous Q T,Th,Sa-HD   Continuous Infusions:    . sodium chloride 10 mL/hr at 12/22/10 1009     Assessment/Plan: 1. Urinary tract infection: VRE.  Completed treatment with vancomycin. 2. Thrombocytopenia:  May be related to her process causing renal failure. Continue to follow. She is not on prophylactic doses of heparin or Lovenox.  Consider outpatient hematology evaluation. 3. End-stage renal disease: Per nephrology. Vascular access to be placed during this hospitalization. 4. Diabetes mellitus type 2: Stable. Consider resuming glipizide if she has been on this as an outpatient. Continue sliding scale insulin.  5. Anemia: Stable. 6. Status post acute encephalopathy: Resolved. Secondary to urinary tract infection and renal failure. 7. Skin breakdown: Foley catheter.   Code Status: DO NOT RESUSCITATE  Family communication: Spoke with the son yesterday. Disposition Plan: Skilled nursing facility.   Brendia Sacks, MD  [facility] Pager [phone] 12/22/2010, 6:49 PM    LOS: 10 days

## 2010-12-22 NOTE — Transfer of Care (Signed)
Immediate Anesthesia Transfer of Care Note  Patient: [name]  Procedure(s) Performed:  ARTERIOVENOUS (AV) FISTULA CREATION - Creation of Left Brachial-Cephalic Fistula  Patient Location: PACU  Anesthesia Type: MAC  Level of Consciousness: awake, alert , oriented and patient cooperative  Airway & Oxygen Therapy: Patient Spontanous Breathing and Patient connected to nasal cannula oxygen  Post-op Assessment: Report given to PACU RN, Post -op Vital signs reviewed and stable and Patient moving all extremities  Post vital signs: Reviewed and stable  Complications: No apparent anesthesia complications

## 2010-12-22 NOTE — H&P (Signed)
--    [facility] [place]  History and Physical Update  The patient was interviewed and re-examined.  The patient's History and Physical has been reviewed and is unchanged from consult on 12/15/10 except for: placement of TDC in the interval.  The plan is: left arm fistula placement.  Leonides Sake, MD [facility] [place] Office: [phone] Pager: [phone]  12/22/2010, 9:54 AM

## 2010-12-22 NOTE — Progress Notes (Signed)
Clinical Social Worker spoke with pt and pt son in regard to SNF placement. Pt first choice for SNF is [facility] as she has been a resident at that SNF previously and pt second choice is [facility]. Clinical Social Worker contacted both facilities to discuss bed availability and left messages for both admissions coordinators. Clinical Social Worker to follow up with facilities in regard to bed offers and availability. Pt insurance must be authorized prior to pt discharging to SNF. Clinical Social Worker to continue to follow and facilitate pt D/C needs when pt medically ready and pt insurance authorized by facility.  [name], MSW, LCSWA  Clinical Social Work [phone]

## 2010-12-22 NOTE — Progress Notes (Signed)
PT/OT/SLP Cancellation Note  Pt was not seen for PT secondary to surgery for dialysis catheter placement. Will try to see pt again tomorrow.   Eran Mistry L. Bricia Taher DPT [phone] 12/22/2010

## 2010-12-22 NOTE — Progress Notes (Signed)
Subjective: Interval History: has complaints of being cold.  Objective: Vital signs in last 24 hours: Temp:  [99 F (37.2 C)-101.7 F (38.7 C)] 99.8 F (37.7 C) (12/17 1350) Pulse Rate:  [84-96] 88  (12/17 1350) Resp:  [16-28] 27  (12/17 1350) BP: (145-164)/(53-74) 163/72 mmHg (12/17 1350) SpO2:  [81 %-100 %] 98 % (12/17 1350) Weight:  [49.1 kg (108 lb 3.9 oz)] 108 lb 3.9 oz (49.1 kg) (12/16 2055) Weight change: -0.5 kg (-1 lb 1.6 oz)  Intake/Output from previous day: 12/16 0701 - 12/17 0700 In: 240 [P.O.:240] Out: 3 [Stool:3] Intake/Output this shift: Total I/O In: 275 [I.V.:275] Out: 20 [Blood:20]  General appearance: alert, cooperative and cachectic Resp: diminished breath sounds bilaterally and rales bibasilar Cardio: S1, S2 normal, systolic murmur: systolic ejection 3/6, harsh at 2nd left intercostal space and diastolic murmur: early diastolic 2/6, decrescendo at 2nd left intercostal space GI: live down 4 cm Extremities: edema 2 plus and presacral and LUA AVF B & T  Lab Results:  Basename 12/22/10 0500 12/21/10 0615  WBC 5.1 5.4  HGB 10.2* 10.1*  HCT 31.8* 31.9*  PLT 73* 59*   BMET:  Basename 12/22/10 0500 12/20/10 0748  NA 131* 135  K 4.1 3.7  CL 96 100  CO2 27 27  GLUCOSE 133* 130*  BUN 47* 34*  CREATININE 3.57* 3.35*  CALCIUM 7.8* 8.2*   No results found for this basename: PTH:2 in the last 72 hours Iron Studies: No results found for this basename: IRON,TIBC,TRANSFERRIN,FERRITIN in the last 72 hours  Studies/Results: No results found.  I have reviewed the patient's current medications.  Assessment/Plan: 1 ESRD access estab, vol xs 2 HTN lower bp 3 AV disease 4 Anemia EPO 5 HPTH vit D    LOS: 10 days   Keontae Levingston L 12/22/2010,4:57 PM

## 2010-12-23 ENCOUNTER — Inpatient Hospital Stay (HOSPITAL_COMMUNITY): Payer: Medicare Other

## 2010-12-23 ENCOUNTER — Ambulatory Visit: Payer: Medicare Other | Admitting: Internal Medicine

## 2010-12-23 ENCOUNTER — Encounter (HOSPITAL_COMMUNITY): Payer: Self-pay | Admitting: Vascular Surgery

## 2010-12-23 LAB — COMPREHENSIVE METABOLIC PANEL
ALT: 36 U/L — ABNORMAL HIGH (ref 0–35)
Calcium: 7.4 mg/dL — ABNORMAL LOW (ref 8.4–10.5)
GFR calc Af Amer: 10 mL/min — ABNORMAL LOW (ref >90)
Glucose, Bld: 106 mg/dL — ABNORMAL HIGH (ref 70–99)
Sodium: 130 mEq/L — ABNORMAL LOW (ref 135–145)
Total Protein: 6.1 g/dL (ref 6.0–8.3)

## 2010-12-23 LAB — CBC
Hemoglobin: 9.5 g/dL — ABNORMAL LOW (ref 12.0–15.0)
MCH: 28.1 pg (ref 26.0–34.0)
MCHC: 32.4 g/dL (ref 30.0–36.0)
RDW: 14.3 % (ref 11.5–15.5)

## 2010-12-23 LAB — GLUCOSE, CAPILLARY: Glucose-Capillary: 190 mg/dL — ABNORMAL HIGH (ref 70–99)

## 2010-12-23 MED ORDER — DARBEPOETIN ALFA-POLYSORBATE 100 MCG/0.5ML IJ SOLN
INTRAMUSCULAR | Status: AC
  Administered 2010-12-23: 100 ug via INTRAVENOUS
  Filled 2010-12-23: qty 0.5

## 2010-12-23 MED ORDER — NA FERRIC GLUC CPLX IN SUCROSE 12.5 MG/ML IV SOLN
125.0000 mg | INTRAVENOUS | Status: DC
  Administered 2010-12-25 – 2010-12-29 (×3): 125 mg via INTRAVENOUS
  Filled 2010-12-23 (×4): qty 10

## 2010-12-23 MED ORDER — METOPROLOL TARTRATE 25 MG PO TABS
25.0000 mg | ORAL_TABLET | Freq: Two times a day (BID) | ORAL | Status: DC
  Administered 2010-12-24 – 2010-12-27 (×6): 25 mg via ORAL
  Filled 2010-12-23 (×9): qty 1

## 2010-12-23 MED ORDER — PARICALCITOL 5 MCG/ML IV SOLN
INTRAVENOUS | Status: AC
  Administered 2010-12-23: 1 ug via INTRAVENOUS
  Filled 2010-12-23: qty 1

## 2010-12-23 MED ORDER — CLONIDINE HCL 0.1 MG PO TABS
0.1000 mg | ORAL_TABLET | Freq: Two times a day (BID) | ORAL | Status: DC
  Administered 2010-12-23 – 2010-12-24 (×2): 0.1 mg via ORAL
  Filled 2010-12-23 (×3): qty 1

## 2010-12-23 NOTE — Plan of Care (Signed)
Problem: Phase I Progression Outcomes Goal: Uremic symptoms managed Outcome: Progressing Pt getting HD tues thurs sat

## 2010-12-23 NOTE — Progress Notes (Signed)
Inpatient Diabetes Program Recommendations  AACE/ADA: New Consensus Statement on Inpatient Glycemic Control (2009)  Target Ranges:  Prepandial:   less than 140 mg/dL      Peak postprandial:   less than 180 mg/dL (1-2 hours)      Critically ill patients:  140 - 180 mg/dL   CBGs 16/10: 960/ 454/ 237/ 216 mg/dl  Inpatient Diabetes Program Recommendations Correction (SSI): Currently on Novolog Moderate SSI. Insulin - Meal Coverage: - Oral Agents: May want to restart home Glipizide 2.5 mg daily.  Note: Postprandial CBGs slightly elevated.  May want to start home Glipizide or could start low dose meal coverage instead (Novolog 2 units tid with meals).

## 2010-12-23 NOTE — Progress Notes (Signed)
Report received from dialysis RN, pt to return soon.

## 2010-12-23 NOTE — Progress Notes (Addendum)
Pt chooses bed at [facility] in [place]. Clinical Social Worker contacted facility and left message and facility to begin authorization for pt insurance. Per MD, pt anticipated to be medically ready for D/C tomorrow. Clinical Social Worker to facilitate pt D/C needs when pt medically ready and insurance authorization is received from facility.  [name], MSW, LCSWA  Clinical Social Work [phone]  Addendum: Clinical Social Worker received phone call from [facility] stating that the facility will not be able to meet pt medical needs. Clinical Social Worker contacted pt son to inform pt son and pt son requested this Clinical Social Worker contact pt PCP who is the doctor at [facility] and contact [place] to get clarification about why facility unable to meet pt needs. Clinical Social Worker contacted PCP and left a message. Clinical Social Worker spoke with Interior and spatial designer of Nursing at [facility] who stated that she would be unable to review pt information again tonight due to a facility engagement and stated that she would review the information in the morning. Clinical Social Worker informed pt son and will follow up with [facility] Clinical Social Worker to follow up with pt son tomorrow as well. Pt son still considering [facility], but first choice is [facility]. Clinical Social Worker to facilitate pt D/C needs when pt medically ready for D/C.  [name], MSW, LCSWA  Clinical Social Work [phone]

## 2010-12-23 NOTE — Progress Notes (Signed)
MD paged to clarify need for telemetry

## 2010-12-23 NOTE — Progress Notes (Signed)
[facility] [place]  Daily Progress Note  Assessment/Planning: POD #1 s/p L BC AVF   No evidence of steal syndrome  Follow in office in 4 weeks  Subjective  - 1 Day Post-Op  No complaints from surgery, no anesthesia or pain in left hand  Objective Filed Vitals:   12/23/10 0900 12/23/10 0908 12/23/10 0939 12/23/10 1100  BP: 113/52 170/79 142/58 110/64  Pulse: 115 96 86 96  Temp:  98.2 F (36.8 C) 97.8 F (36.6 C) 97.1 F (36.2 C)  TempSrc:  Oral Oral Oral  Resp: 20 21 20 20   Height:      Weight:  98 lb 12.3 oz (44.8 kg)    SpO2:  93% 96% 97%    Intake/Output Summary (Last 24 hours) at 12/23/10 1156 Last data filed at 12/23/10 1100  Gross per 24 hour  Intake    285 ml  Output   2977 ml  Net  -2692 ml    VASC  L antecubital incision c/d/i, palpable radial pulse, hand grip 5/5, sensation grossly intact, palpable thrill, +bruit  Laboratory CBC    Component Value Date/Time   WBC 4.8 12/23/2010 0537   HGB 9.5* 12/23/2010 0537   HCT 29.3* 12/23/2010 0537   PLT 101* 12/23/2010 0537    BMET    Component Value Date/Time   NA 130* 12/23/2010 0537   K 4.3 12/23/2010 0537   CL 94* 12/23/2010 0537   CO2 25 12/23/2010 0537   GLUCOSE 106* 12/23/2010 0537   GLUCOSE 144* 01/13/2006 0000   BUN 59* 12/23/2010 0537   CREATININE 4.29* 12/23/2010 0537   CALCIUM 7.4* 12/23/2010 0537   GFRNONAA 8* 12/23/2010 0537   GFRAA 10* 12/23/2010 0537    Leonides Sake, MD [facility] [place] Office: [phone] Pager: [phone]  12/23/2010, 11:56 AM

## 2010-12-23 NOTE — Progress Notes (Signed)
PROGRESS NOTE  [name]:[id number] DOB: 01-13-1920 DOA: 12/12/2010 PCP: Tillman Abide, MD, MD  Brief narrative:  77 y.o. woman who presented with increased confusion and lethargy.  She was last seen normal on Dec 6th, and the son reports that patient has been eating and drinking well, he states that she has been undergoing a renal workup due to worsening of her kidney numbers over the past few months. She was admitted for acute encephalopathy, urinary tract infection and end-stage renal disease.  Or encephalopathy quickly resolved with IV antibiotics and she has completed a course of IV vancomycin at this time for urinary tract infection. Unfortunately her renal failure progressed to the point where she required hemodialysis. This is been successfully done. Vascular surgery saw her in consultation and the graft is in place to the left arm. She has had unexplained thrombocytopenia which has remained stable. Etiology is unclear but may be related to the phenomenon which has caused her kidney failure. This could be further evaluated in the outpatient setting if clinically indicated. Her diabetes mellitus has been stable. Her other issue is wound care she has had some breakdown around her rectum secondary to incontinence.  Past medical history: Anemia NOS, Personal history of colonic polyps, Diabetes mellitus type II, GERD (gastroesophageal reflux disease), Hyperlipidemia, Hypertension, Hypothyroidism, Osteoarthritis, Osteopenia, Urinary incontinence, Pulmonary hypertension, Anxiety, Renal insufficiency, Coronary artery disease, Aortic stenosis.   Consultants:   Nephrology   Vascular surgery  Physical therapy: Skilled nursing facility recommended. Equipment recommendations deferred.  Procedures:   Hemodialysis  December 13: Insertion of dialysis catheter  December 12: Platelet transfusion  Antibiotics:  December 11: Vancomycin  Interim History: Interval documentation reviewed.  Status post surgery today. Subjective: Feels better.  Objective:  Filed Vitals:   12/23/10 0900 12/23/10 0908 12/23/10 0939 12/23/10 1100  BP: 113/52 170/79 142/58 110/64  Pulse: 115 96 86 96  Temp:  98.2 F (36.8 C) 97.8 F (36.6 C) 97.1 F (36.2 C)  TempSrc:  Oral Oral Oral  Resp: 20 21 20 20   Height:      Weight:  44.8 kg (98 lb 12.3 oz)    SpO2:  93% 96% 97%    Intake/Output Summary (Last 24 hours) at 12/23/10 1352 Last data filed at 12/23/10 1100  Gross per 24 hour  Intake     60 ml  Output   2957 ml  Net  -2897 ml    Exam: General: Appears well. Cardiovascular: Regular rate and rhythm. No murmur, rub or gallop. Respiratory: Clear to auscultation bilaterally. No wheezes, rales or rhonchi. Normal respiratory effort.  Exam current December 17.  Data Reviewed: Basic Metabolic Panel:  Lab 12/23/10 9562 12/22/10 0500 12/20/10 0748 12/19/10 0719 12/17/10 0650  NA 130* 131* 135 135 135  K 4.3 4.1 -- -- --  CL 94* 96 100 99 98  CO2 25 27 27 26 23   GLUCOSE 106* 133* 130* 206* 145*  BUN 59* 47* 34* 43* 43*  CREATININE 4.29* 3.57* 3.35* 4.02* 3.50*  CALCIUM 7.4* 7.8* 8.2* 8.0* 7.6*  MG -- -- -- -- --  PHOS 3.6 -- 2.8 5.5* --   CBC:  Lab 12/23/10 0537 12/22/10 0500 12/21/10 0615 12/20/10 0748 12/20/10 0500  WBC 4.8 5.1 5.4 6.2 6.4  NEUTROABS -- -- -- -- --  HGB 9.5* 10.2* 10.1* 10.5* 10.3*  HCT 29.3* 31.8* 31.9* 32.2* 31.6*  MCV 86.7 87.8 89.4 88.0 88.0  PLT 101* 73* 59* 68* 69*   CBG:  Lab 12/23/10  1221 12/23/10 0954 12/22/10 2158 12/22/10 1703 12/22/10 1245  GLUCAP 173* 100* 216* 237* 110*    Recent Results (from the past 240 hour(s))  PATHOLOGIST SMEAR REVIEW     Status: Normal   Collection Time   12/13/10  3:28 PM      Component Value Range Status Comment   Tech Review MILD TO MODERATE ANEMIA WITH NORMAL   Final   CULTURE, BLOOD (ROUTINE X 2)     Status: Normal   Collection Time   12/16/10  1:32 AM      Component Value Range Status Comment    Specimen Description BLOOD LEFT HAND   Final    Special Requests BOTTLES DRAWN AEROBIC ONLY 5CC   Final    Setup Time [id number]   Final    Culture NO GROWTH 5 DAYS   Final    Report Status 12/22/2010 FINAL   Final   CULTURE, BLOOD (ROUTINE X 2)     Status: Normal   Collection Time   12/16/10  1:36 AM      Component Value Range Status Comment   Specimen Description BLOOD LEFT ARM   Final    Special Requests BOTTLES DRAWN AEROBIC AND ANAEROBIC [facility] EACH   Final    Setup Time [id number]   Final    Culture NO GROWTH 5 DAYS   Final    Report Status 12/22/2010 FINAL   Final   CULTURE, BLOOD (ROUTINE X 2)     Status: Normal (Preliminary result)   Collection Time   12/21/10  7:22 PM      Component Value Range Status Comment   Specimen Description BLOOD LEFT ARM   Final    Special Requests BOTTLES DRAWN AEROBIC AND ANAEROBIC 10 CC   Final    Setup Time [id number]   Final    Culture     Final    Value:        BLOOD CULTURE RECEIVED NO GROWTH TO DATE CULTURE WILL BE HELD FOR 5 DAYS BEFORE ISSUING A FINAL NEGATIVE REPORT   Report Status PENDING   Incomplete   CULTURE, BLOOD (ROUTINE X 2)     Status: Normal (Preliminary result)   Collection Time   12/21/10  7:32 PM      Component Value Range Status Comment   Specimen Description BLOOD RIGHT ARM   Final    Special Requests BOTTLES DRAWN AEROBIC AND ANAEROBIC 10 CC   Final    Setup Time [id number]   Final    Culture     Final    Value:        BLOOD CULTURE RECEIVED NO GROWTH TO DATE CULTURE WILL BE HELD FOR 5 DAYS BEFORE ISSUING A FINAL NEGATIVE REPORT   Report Status PENDING   Incomplete   SURGICAL PCR SCREEN     Status: Normal   Collection Time   12/22/10  6:35 AM      Component Value Range Status Comment   MRSA, PCR NEGATIVE  NEGATIVE  Final    Staphylococcus aureus NEGATIVE  NEGATIVE  Final    Scheduled Meds:    . amLODipine  10 mg Oral QHS  . calcium carbonate  1 tablet Oral TID WC  . cloNIDine  0.2 mg Oral TID  .  darbepoetin (ARANESP) injection - DIALYSIS  100 mcg Intravenous Q Tue-HD  . estradiol  0.5 mg Oral Daily  . feeding supplement  30 mL Oral BID  . heparin  40 Units/kg Dialysis Once  in dialysis  . insulin aspart  0-15 Units Subcutaneous TID WC  . insulin aspart  0-5 Units Subcutaneous QHS  . levothyroxine  37.5 mcg Oral Custom  . levothyroxine  75 mcg Oral Custom  . losartan  50 mg Oral QHS  . magic mouthwash  5 mL Oral QID  . metoprolol  75 mg Oral BID  . multivitamin  1 tablet Oral QHS  . paricalcitol  1 mcg Intravenous 3 times weekly  . polyethylene glycol  17 g Oral Daily  . simvastatin  20 mg Oral QHS  . DISCONTD: alteplase  2 mg Intracatheter Once  . DISCONTD: amLODipine  10 mg Oral Daily  . DISCONTD: folic acid  0.5 mg Oral Daily  . DISCONTD: losartan  50 mg Oral Daily   Continuous Infusions:    . DISCONTD: sodium chloride 10 mL/hr at 12/22/10 1009     Assessment/Plan: 1. Urinary tract infection: VRE. Completed treatment with vancomycin. 2. Thrombocytopenia: May be related to her process causing renal failure. Continue to follow. She is not on prophylactic doses of heparin or Lovenox.  Consider outpatient hematology evaluation. 3. End-stage renal disease: Per nephrology. Vascular access to be placed during this hospitalization. 4. Diabetes mellitus type 2: Stable. Consider resuming glipizide if she has been on this as an outpatient. Continue sliding scale insulin.  5. Anemia: Stable. 6. Status post AV graft: Followup with Dr. Imogene Burn in 4 weeks. 7. Status post acute encephalopathy: Resolved. Secondary to urinary tract infection and renal failure. 8. Skin breakdown: Foley catheter.   Code Status: DO NOT RESUSCITATE  Family communication: Spoke with the son yesterday. Disposition Plan: Skilled nursing facility when insurance approves.   Brendia Sacks, MD  [facility] Pager [phone] 12/23/2010, 1:52 PM    LOS: 11 days

## 2010-12-23 NOTE — Progress Notes (Signed)
Subjective: Interval History: none.  Objective: Vital signs in last 24 hours: Temp:  [97.1 F (36.2 C)-99.3 F (37.4 C)] 97.1 F (36.2 C) (12/18 1100) Pulse Rate:  [82-115] 96  (12/18 1100) Resp:  [16-22] 20  (12/18 1100) BP: (83-170)/(26-79) 110/64 mmHg (12/18 1100) SpO2:  [92 %-97 %] 97 % (12/18 1100) Weight:  [44.8 kg (98 lb 12.3 oz)-47.6 kg (104 lb 15 oz)] 98 lb 12.3 oz (44.8 kg) (12/18 0908) Weight change: -1.5 kg (-3 lb 4.9 oz)  Intake/Output from previous day: 12/17 0701 - 12/18 0700 In: 275 [I.V.:275] Out: 20 [Blood:20] Intake/Output this shift: Total I/O In: 60 [P.O.:60] Out: 2957 [Other:2957]  General appearance: alert, cooperative and lethargic Resp: diminished breath sounds bilaterally and rales bibasilar Cardio: S1, S2 normal, systolic murmur: systolic ejection 3/6, harsh at 2nd left intercostal space and diastolic murmur: early diastolic 2/6, blowing at 2nd left intercostal space GI: liver soen 4 cm Extremities: AVF LUA B & T  Lab Results:  Basename 12/23/10 0537 12/22/10 0500  WBC 4.8 5.1  HGB 9.5* 10.2*  HCT 29.3* 31.8*  PLT 101* 73*   BMET:  Basename 12/23/10 0537 12/22/10 0500  NA 130* 131*  K 4.3 4.1  CL 94* 96  CO2 25 27  GLUCOSE 106* 133*  BUN 59* 47*  CREATININE 4.29* 3.57*  CALCIUM 7.4* 7.8*   No results found for this basename: PTH:2 in the last 72 hours Iron Studies: No results found for this basename: IRON,TIBC,TRANSFERRIN,FERRITIN in the last 72 hours  Studies/Results: No results found.  I have reviewed the patient's current medications.  Assessment/Plan: 1 ESRD  Vol better, avf/cath.  Stable.  2 Anemia EPO Fe 3 HPTH 4 AS/AI 5 Debill signif issue P PT, mobilize. EPO HD TTS    LOS: 11 days   Cailyn Houdek L 12/23/2010,2:25 PM

## 2010-12-23 NOTE — Progress Notes (Signed)
OT NOTES:(late entry) Eval attempted this am, pt in hemodialysis. Pt now recently returned to room and attempting to eat some  this am as well as currently too lethargic to participate in eval. Will re-attempt later today as able for OT eval.

## 2010-12-23 NOTE — Progress Notes (Addendum)
Physical Therapy Treatment Patient Details Name: LEONIA HEATHERLY MRN: [id number] DOB: [birth year] Today's Date: 12/23/2010  PT Assessment/Plan  PT - Assessment/Plan Comments on Treatment Session: pt with general weakness, incontinent of bowel, difficulty getting comfortable on bottom seated 3L O2 sats 97% Pain- c/o discomfort on bottom, RN aware- cream applied and pt tailbone suspended between 2 pillows when seated in recliner     PT Treatment Precautions/Restrictions  Precautions Precautions: Fall Precaution Comments: O2 Restrictions Weight Bearing Restrictions: No Mobility (including Balance) Bed Mobility Supine to Sit: 4: Min assist;With rails Transfers Sit to Stand: 4: Min assist;With upper extremity assist;From bed Sit to Stand Details (indicate cue type and reason): repeated multiple times for hygiene and strengthening Stand to Sit: 4: Min assist Ambulation/Gait Ambulation/Gait: Yes Ambulation/Gait Assistance: 4: Min assist Ambulation/Gait Assistance Details (indicate cue type and reason): flexed posture, assist to move RW thru tight spaces Ambulation Distance (Feet): 10 Feet Assistive device: Rolling walker Gait velocity: decreased Stairs: No Wheelchair Mobility Wheelchair Mobility: No    Exercise  Other Exercises Other Exercises: LAQ, DF/PF x 10 Other Exercises: seated thoracic retractions, pelic tilts to increase extension pt very kyphotic and tends toward flexed standing and sitting posture; pt able to put socks on once assisted to get LE crossed over the other; pt requests assist before trying mobility activity End of Session PT - End of Session Equipment Utilized During Treatment: Gait belt Activity Tolerance: Patient limited by fatigue;Other (comment) (unable to participate in am d/t fatigue after dialysis,) Patient left: in chair;with call bell in reach (MD and RN present) Nurse Communication: Other (comment) (for bleeding on labia, raw  bottom) General Behavior During Session: [facility] for tasks performed Cognition:  (followed simple functional commands)  Michaelene Song 12/23/2010, 2:32 PM

## 2010-12-23 NOTE — Progress Notes (Signed)
Pt still in dialysis at this time, meds to be given once pt returns from dialysis.

## 2010-12-24 ENCOUNTER — Telehealth: Payer: Self-pay | Admitting: Internal Medicine

## 2010-12-24 LAB — GLUCOSE, CAPILLARY
Glucose-Capillary: 182 mg/dL — ABNORMAL HIGH (ref 70–99)
Glucose-Capillary: 335 mg/dL — ABNORMAL HIGH (ref 70–99)

## 2010-12-24 MED ORDER — GLIPIZIDE 5 MG PO TABS
5.0000 mg | ORAL_TABLET | Freq: Every day | ORAL | Status: DC
  Administered 2010-12-26 – 2010-12-29 (×3): 5 mg via ORAL
  Filled 2010-12-24 (×6): qty 1

## 2010-12-24 MED ORDER — HEPARIN SODIUM (PORCINE) 1000 UNIT/ML DIALYSIS: 100.0000 [IU]/kg | INTRAMUSCULAR | Status: DC | PRN

## 2010-12-24 MED ORDER — ZOLPIDEM TARTRATE 5 MG PO TABS: 5.0000 mg | ORAL_TABLET | Freq: Every evening | ORAL | Status: DC | PRN

## 2010-12-24 NOTE — Progress Notes (Signed)
Clinical Social Worker contacted [employer] this morning and left a message with facility [facility] and facility Child psychotherapist. Clinical Social Worker contacted pt PCP office, Dr. Alphonsus Sias at the request of the family and left message. Clinical Social Worker reviewed chart and per Dr. Alphonsus Sias note he is checking with admissions coordinator. Clinical Social Worker to follow up with pt and pt son and [facility] to discuss pt disposition. Clinical Social Worker to facilitate pt D/C needs when bed available at SNF.  [name], MSW, LCSWA  Clinical Social Work [phone]

## 2010-12-24 NOTE — Telephone Encounter (Signed)
Dr.Letvak spoke with patient about this.

## 2010-12-24 NOTE — Telephone Encounter (Signed)
I will check with the admissions coordinator but they may have a problem if she is on dialysis or has complex indwelling catheters

## 2010-12-24 NOTE — Progress Notes (Signed)
Clinical Social Worker received phone call from [facility] stating that the facility was confirming that they would be unable to offer a bed for pt. Clinical Social Worker informed pt son and pt and pt son choose bed at [facility]. Clinical Social Worker informed Armed forces operational officer and [facility] has submitted pt insurance for authorization. Facility must receive insurance authorization for pt before pt discharges to SNF. Clinical Social Worker to facilitate pt D/C needs when pt medically ready for D/C and facility receives insurance authorization.  [name], MSW, LCSWA  Clinical Social Work [phone]

## 2010-12-24 NOTE — Progress Notes (Signed)
Subjective: Interval History: none.  Objective: Vital signs in last 24 hours: Temp:  [97.3 F (36.3 C)-98.2 F (36.8 C)] 98.2 F (36.8 C) (12/19 0945) Pulse Rate:  [74-81] 81  (12/19 0945) Resp:  [20] 20  (12/19 0945) BP: (104-112)/(55-60) 112/55 mmHg (12/19 0945) SpO2:  [97 %-98 %] 97 % (12/19 0945) Weight change: -2.8 kg (-6 lb 2.8 oz)  Intake/Output from previous day: 12/18 0701 - 12/19 0700 In: 240 [P.O.:240] Out: 2957  Intake/Output this shift: Total I/O In: 120 [P.O.:120] Out: -   General appearance: alert, cooperative and pale Resp: diminished breath sounds bilaterally and rales bibasilar Cardio: regular rate and rhythm, systolic murmur: systolic ejection 3/6, harsh at 2nd left intercostal space and diastolic murmur: early diastolic 2/6, decrescendo at 2nd left intercostal space GI: liver down 3 cm Extremities: LUA AVF B & T  Lab Results:  Basename 12/23/10 0537 12/22/10 0500  WBC 4.8 5.1  HGB 9.5* 10.2*  HCT 29.3* 31.8*  PLT 101* 73*   BMET:  Basename 12/23/10 0537 12/22/10 0500  NA 130* 131*  K 4.3 4.1  CL 94* 96  CO2 25 27  GLUCOSE 106* 133*  BUN 59* 47*  CREATININE 4.29* 3.57*  CALCIUM 7.4* 7.8*   No results found for this basename: PTH:2 in the last 72 hours Iron Studies: No results found for this basename: IRON,TIBC,TRANSFERRIN,FERRITIN in the last 72 hours  Studies/Results: No results found.  I have reviewed the patient's current medications.  Assessment/Plan: 1 ESRD for HD in am vol better 2 HTN will lower meds.  bp low 3 anemia EPO & Fe 4 Debill 5 AS/AI 6 DM good conntrol  7 ptlt better P as above    LOS: 12 days   Isabelly Kobler L 12/24/2010,12:52 PM

## 2010-12-24 NOTE — Progress Notes (Signed)
Occupational Therapy Evaluation Patient Details Name: [name] MRN: [id number] DOB: [birth year] Today's Date: 12/24/2010 2:10-3:50  Problem List:  Patient Active Problem List  Diagnoses  . HYPOTHYROIDISM  . DIABETES MELLITUS, TYPE II  . HYPERLIPIDEMIA  . ANEMIA-NOS  . ANXIETY  . HYPERTENSION  . CORONARY ARTERY DISEASE  . PULMONARY HYPERTENSION  . AORTIC STENOSIS  . CAROTID ARTERY DISEASE  . PAROTIDITIS  . GERD  . NEURODERMATITIS  . OSTEOARTHRITIS  . OSTEOPENIA  . URINARY INCONTINENCE  . COLONIC POLYPS, HX OF  . Hypoglycemia  . Constipation, other cause  . Edema  . Arterial insufficiency-lower  . S/P aortic valve replacement  . Hypothyroidism  . Coronary artery disease  . Anemia  . Hypocalcemia  . Hyperphosphatemia  . Encephalopathy acute  . UTI (lower urinary tract infection)  . Thrombocytopenia  . End stage renal disease  . Anemia of chronic kidney failure    Past Medical History:  Past Medical History  Diagnosis Date  . Anemia     NOS  . Personal history of colonic polyps   . Diabetes mellitus     type II  . GERD (gastroesophageal reflux disease)   . Hyperlipidemia   . Hypertension   . Hypothyroidism   . Osteoarthritis   . Osteopenia   . Urinary incontinence   . Pulmonary hypertension   . Anxiety   . Renal insufficiency   . Coronary artery disease   . Aortic stenosis    Past Surgical History:  Past Surgical History  Procedure Date  . Abdominal hysterectomy   . Tonsillectomy   . Cardiac catheterization 2000    cad  . Replacement total knee bilateral 05/1998  . Cataract extraction   . Coronary artery bypass graft     od  . Adenosine myoview 2007    benign, EF 69%  . Carotid endarterectomy 2011    Right  . Coronary artery bypass graft 2009  . Aortic valve replacement 2009  . Amputation-left great toe 7/12    Dr August Saucer  . Traumatic amputation of right dip joint of index finger   . Insertion of dialysis catheter 12/18/2010   Procedure: INSERTION OF DIALYSIS CATHETER;  Surgeon: Pryor Ochoa, MD;  Location: [facility] OR;  Service: Vascular;  Laterality: Right;  . Av fistula placement 12/22/2010    Procedure: ARTERIOVENOUS (AV) FISTULA CREATION;  Surgeon: Nilda Simmer, MD;  Location: [facility] OR;  Service: Vascular;  Laterality: Left;  Creation of Left Brachial-Cephalic Fistula    OT Assessment/Plan/Recommendation OT Assessment Clinical Impression Statement: This 77 yo female admitted with confusion and lethargy and now on hemodialysis presents at a PLOF of Min A- I level at home. Will bendefit from follow-up OT at SNF level. No further acute OT needs, will sign off. OT Recommendation/Assessment: All further OT needs can be met in the next venue of care OT Problem List: Decreased strength;Decreased activity tolerance;Impaired balance (sitting and/or standing);Pain OT Therapy Diagnosis : Generalized weakness;Acute pain OT Recommendation Follow Up Recommendations: Skilled nursing facility Equipment Recommended: Defer to next venue Individuals Consulted Consulted and Agree with Results and Recommendations: Patient OT Goals    OT Evaluation Precautions/Restrictions  Precautions Precautions: Fall Precaution Comments: O2 Required Braces or Orthoses: No Restrictions Weight Bearing Restrictions: No Prior Functioning Home Living Lives With: Alone Receives Help From: Personal care attendant;Other (Comment) (10-4 M-F) Type of Home: House Home Layout: One level Home Access: Stairs to enter Entrance Stairs-Rails: Can reach both Entrance Stairs-Number of Steps: 3 Bathroom Shower/Tub:  Walk-in shower Bathroom Toilet: Standard Bathroom Accessibility: Yes How Accessible: Accessible via walker Home Adaptive Equipment: Walker - standard;Wheelchair - manual;Bedside commode/3-in-1 Prior Function Level of Independence: Independent with basic ADLs;Independent with homemaking with ambulation;Independent with gait;Independent  with transfers Driving: Yes Vocation: Retired ADL ADL Eating/Feeding: Simulated;Independent Where Assessed - Eating/Feeding: Bed level Grooming: Simulated;Set up Grooming Details (indicate cue type and reason): pt cannot gather items by herself at this time Where Assessed - Grooming: Supine, head of bed up Upper Body Bathing: Simulated;Minimal assistance Where Assessed - Upper Body Bathing: Sit to stand from chair Lower Body Bathing: Simulated;Maximal assistance Lower Body Bathing Details (indicate cue type and reason): Pt with very sore bottom and sores on bottom Where Assessed - Lower Body Bathing: Sit to stand from chair Upper Body Dressing: Simulated;Minimal assistance Where Assessed - Upper Body Dressing: Sitting, bed Lower Body Dressing: Maximal assistance Lower Body Dressing Details (indicate cue type and reason): Pt with very sore bottom and sores on bottom Where Assessed - Lower Body Dressing: Sit to stand from chair Toilet Transfer: Simulated;Other (comment) (bed around to door and back to recliner) Toilet Transfer Method: Ambulating Toileting - Clothing Manipulation: Simulated;+1 Total assistance Toileting - Hygiene: Performed;+1 Total assistance Tub/Shower Transfer: Not assessed Tub/Shower Transfer Method: Not assessed Equipment Used: Rolling walker;Other (comment) (gait belt) Vision/Perception  Vision - History Baseline Vision: Other (comment) (trifocals) Visual History: Cataracts Patient Visual Report: No change from baseline Cognition Cognition Arousal/Alertness: Lethargic Overall Cognitive Status: Appears within functional limits for tasks assessed Orientation Level: Oriented to person Sensation/Coordination   Extremity Assessment RUE Assessment RUE Assessment: Within Functional Limits LUE Assessment LUE Assessment: Within Functional Limits Mobility  Bed Mobility Bed Mobility: Yes Rolling Right: 6: Modified independent (Device/Increase time);With rail  (HOB flat) Right Sidelying to Sit: 4: Min assist;With rails;HOB flat Sitting - Scoot to Edge of Bed: 4: Min assist Transfers Transfers: Yes Sit to Stand: 4: Min assist;With upper extremity assist;From bed Stand to Sit: 4: Min assist;With upper extremity assist;To chair/3-in-1 Stand to Sit Details: verbal cues for hand placement Exercises   End of Session OT - End of Session Equipment Utilized During Treatment: Other (comment) (RW, gait belt) Activity Tolerance: Patient limited by pain Patient left: in chair;with call bell in reach;with family/visitor present;Other (comment) (2 nieces) General Behavior During Session: [facility] for tasks performed Cognition: [facility] for tasks performed   Evette Georges [phone] 12/24/2010, 3:22 PM

## 2010-12-24 NOTE — Progress Notes (Signed)
Subjective: Feels quite well, good appetite, no new issues. Would like sleeping pill tonight.  Objective: Vital signs in last 24 hours: Temp:  [97.4 F (36.3 C)-98.7 F (37.1 C)] 98.7 F (37.1 C) (12/19 1400) Pulse Rate:  [74-84] 84  (12/19 1400) Resp:  [20] 20  (12/19 1400) BP: (106-128)/(55-90) 128/90 mmHg (12/19 1400) SpO2:  [97 %] 97 % (12/19 0945) Weight change: -2.8 kg (-6 lb 2.8 oz) Last BM Date: 12/24/10  Intake/Output from previous day: 12/18 0701 - 12/19 0700 In: 240 [P.O.:240] Out: 2957      Physical Exam: General: Comfortable, alert, communicative, fully oriented, not short of breath at rest.  HEENT:  Mild clinical pallor, no jaundice, no conjunctival injection or discharge. NECK:  Supple, JVP not seen, no carotid bruits, no palpable lymphadenopathy, no palpable goiter. CHEST:  Clinically clear to auscultation, no wheezes, no crackles. HEART:  Sounds 1 and 2 heard, normal, regular, no murmurs. ABDOMEN:  Full, soft, no scars, non-tender, no palpable organomegaly, no palpable masses, normal bowel sounds. GENITALIA:  Not examined. LOWER EXTREMITIES:  No pitting edema, palpable peripheral pulses. MUSCULOSKELETAL SYSTEM:  Generalized osteoarthritic changes, otherwise, normal. CENTRAL NERVOUS SYSTEM:  No focal neurologic deficit on gross examination.  Lab Results:  Basename 12/23/10 0537 12/22/10 0500  WBC 4.8 5.1  HGB 9.5* 10.2*  HCT 29.3* 31.8*  PLT 101* 73*    Basename 12/23/10 0537 12/22/10 0500  NA 130* 131*  K 4.3 4.1  CL 94* 96  CO2 25 27  GLUCOSE 106* 133*  BUN 59* 47*  CREATININE 4.29* 3.57*  CALCIUM 7.4* 7.8*   Recent Results (from the past 240 hour(s))  CULTURE, BLOOD (ROUTINE X 2)     Status: Normal   Collection Time   12/16/10  1:32 AM      Component Value Range Status Comment   Specimen Description BLOOD LEFT HAND   Final    Special Requests BOTTLES DRAWN AEROBIC ONLY 5CC   Final    Setup Time [id number]   Final    Culture NO GROWTH  5 DAYS   Final    Report Status 12/22/2010 FINAL   Final   CULTURE, BLOOD (ROUTINE X 2)     Status: Normal   Collection Time   12/16/10  1:36 AM      Component Value Range Status Comment   Specimen Description BLOOD LEFT ARM   Final    Special Requests BOTTLES DRAWN AEROBIC AND ANAEROBIC [facility] EACH   Final    Setup Time [id number]   Final    Culture NO GROWTH 5 DAYS   Final    Report Status 12/22/2010 FINAL   Final   CULTURE, BLOOD (ROUTINE X 2)     Status: Normal (Preliminary result)   Collection Time   12/21/10  7:22 PM      Component Value Range Status Comment   Specimen Description BLOOD LEFT ARM   Final    Special Requests BOTTLES DRAWN AEROBIC AND ANAEROBIC 10 CC   Final    Setup Time [id number]   Final    Culture     Final    Value:        BLOOD CULTURE RECEIVED NO GROWTH TO DATE CULTURE WILL BE HELD FOR 5 DAYS BEFORE ISSUING A FINAL NEGATIVE REPORT   Report Status PENDING   Incomplete   CULTURE, BLOOD (ROUTINE X 2)     Status: Normal (Preliminary result)   Collection Time   12/21/10  7:32 PM  Component Value Range Status Comment   Specimen Description BLOOD RIGHT ARM   Final    Special Requests BOTTLES DRAWN AEROBIC AND ANAEROBIC 10 CC   Final    Setup Time [id number]   Final    Culture     Final    Value:        BLOOD CULTURE RECEIVED NO GROWTH TO DATE CULTURE WILL BE HELD FOR 5 DAYS BEFORE ISSUING A FINAL NEGATIVE REPORT   Report Status PENDING   Incomplete   SURGICAL PCR SCREEN     Status: Normal   Collection Time   12/22/10  6:35 AM      Component Value Range Status Comment   MRSA, PCR NEGATIVE  NEGATIVE  Final    Staphylococcus aureus NEGATIVE  NEGATIVE  Final      Studies/Results: No results found.  Medications: Scheduled Meds:   . amLODipine  10 mg Oral QHS  . calcium carbonate  1 tablet Oral TID WC  . darbepoetin (ARANESP) injection - DIALYSIS  100 mcg Intravenous Q Tue-HD  . estradiol  0.5 mg Oral Daily  . feeding supplement  30 mL Oral BID    . ferric glucontate (NULECIT) IV  125 mg Intravenous Q T,Th,Sa-HD  . heparin  40 Units/kg Dialysis Once in dialysis  . insulin aspart  0-15 Units Subcutaneous TID WC  . insulin aspart  0-5 Units Subcutaneous QHS  . levothyroxine  37.5 mcg Oral Custom  . levothyroxine  75 mcg Oral Custom  . magic mouthwash  5 mL Oral QID  . metoprolol  25 mg Oral BID  . multivitamin  1 tablet Oral QHS  . paricalcitol  1 mcg Intravenous 3 times weekly  . polyethylene glycol  17 g Oral Daily  . simvastatin  20 mg Oral QHS  . DISCONTD: cloNIDine  0.1 mg Oral BID  . DISCONTD: losartan  50 mg Oral QHS   Continuous Infusions:  PRN Meds:.sodium chloride, acetaminophen, acetaminophen, feeding supplement (NEPRO CARB STEADY), fentaNYL, heparin, heparin, heparin, heparin, heparin, heparin, HYDROmorphone, lidocaine, lidocaine-prilocaine, ondansetron (ZOFRAN) IV, ondansetron, oxyCODONE, pentafluoroprop-tetrafluoroeth, promethazine  Assessment/Plan: 1. Urinary tract infection: VRE. Fully treated with Vancomycin. 2. Thrombocytopenia: Patient is not on prophylactic doses of heparin or Lovenox. Appears stable, and already trending upwards. 3. End-stage renal disease: Per nephrology. Status post LUE AVF on 12/22/10, by Dr Imogene Burn. Followup with Dr. Imogene Burn in 4 weeks. 4. Diabetes mellitus type 2: Reasonably controlled. Will restart Glipizide. Continue sliding scale insulin.  5. Anemia: Stable. 6. Status post AV graft. See above.  7. Status post acute encephalopathy: Resolved. Secondary to urinary tract infection and renal failure.    Comment: Possible discharge on 12/25/10, after HD.   LOS: 12 days   [name],[name] 12/24/2010, 7:28 PM

## 2010-12-24 NOTE — Telephone Encounter (Signed)
Suzanna a Child psychotherapist at [employer] is trying to get the patient into [facility] stated they couldn't meet her medical needs.  Patient really wants to stay with you as her doctor and wanted to know if there was anything you could do to change their decisions.   [name]  [phone]  If you have futher questions.

## 2010-12-25 ENCOUNTER — Inpatient Hospital Stay (HOSPITAL_COMMUNITY): Payer: Medicare Other

## 2010-12-25 LAB — GLUCOSE, CAPILLARY: Glucose-Capillary: 244 mg/dL — ABNORMAL HIGH (ref 70–99)

## 2010-12-25 LAB — CBC
MCV: 87 fL (ref 78.0–100.0)
Platelets: 140 10*3/uL — ABNORMAL LOW (ref 150–400)
RDW: 14.4 % (ref 11.5–15.5)
WBC: 5.1 10*3/uL (ref 4.0–10.5)

## 2010-12-25 LAB — RENAL FUNCTION PANEL
Albumin: 1.7 g/dL — ABNORMAL LOW (ref 3.5–5.2)
Chloride: 93 mEq/L — ABNORMAL LOW (ref 96–112)
Creatinine, Ser: 4.29 mg/dL — ABNORMAL HIGH (ref 0.50–1.10)
GFR calc non Af Amer: 8 mL/min — ABNORMAL LOW (ref >90)

## 2010-12-25 MED ORDER — PARICALCITOL 5 MCG/ML IV SOLN
INTRAVENOUS | Status: AC
  Administered 2010-12-25: 1 ug via INTRAVENOUS
  Filled 2010-12-25: qty 1

## 2010-12-25 NOTE — Progress Notes (Signed)
Physical Therapy Treatment Patient Details Name: DONNELLE RUBEY MRN: [id number] DOB: 05/14/1920 Today's Date: 12/25/2010  PT Assessment/Plan  PT - Assessment/Plan Comments on Treatment Session: pt with general weakness, incontinent of bowel, difficulty getting comfortable on bottom seated.  Pt had bowel movement while ambulating did not seem aware.   PT Plan: Discharge plan remains appropriate;Frequency remains appropriate PT Frequency: Min 3X/week Follow Up Recommendations: Skilled nursing facility Equipment Recommended: Defer to next venue PT Goals  Acute Rehab PT Goals PT Goal Formulation: With patient Time For Goal Achievement: 2 weeks Pt will go Supine/Side to Sit: Independently PT Goal: Supine/Side to Sit - Progress: Progressing toward goal Pt will go Sit to Supine/Side: Independently PT Goal: Sit to Supine/Side - Progress: Progressing toward goal Pt will go Sit to Stand: with modified independence PT Goal: Sit to Stand - Progress: Progressing toward goal Pt will go Stand to Sit: with modified independence PT Goal: Stand to Sit - Progress: Progressing toward goal Pt will Ambulate: >150 feet;with modified independence;with least restrictive assistive device PT Goal: Ambulate - Progress: Not met Additional Goals Additional Goal #1: Pt will perform TUG or DGI within safe limits.   PT Goal: Additional Goal #1 - Progress: Not met  PT Treatment Precautions/Restrictions  Precautions Precautions: Fall Precaution Comments: O2 Required Braces or Orthoses: No Restrictions Weight Bearing Restrictions: No Mobility (including Balance) Bed Mobility Bed Mobility: Yes Rolling Right: 4: Min assist Rolling Right Details (indicate cue type and reason): cues for technique assist to rotate hips  Right Sidelying to Sit: 4: Min assist;With rails;HOB flat Supine to Sit: 3: Mod assist;HOB flat (pt 60%) Supine to Sit Details (indicate cue type and reason): verbal cues for technique. Assist for  bil LE and trunk management secondary to weakness.   Sitting - Scoot to Edge of Bed: 4: Min assist Sitting - Scoot to [place] of Bed Details (indicate cue type and reason): assist to advance hips, pt pulling on my hands.   Sit to Supine - Right: Not Tested (comment) Transfers Transfers: Yes Sit to Stand: 4: Min assist;With upper extremity assist;From bed Sit to Stand Details (indicate cue type and reason): assistance for weakness Stand to Sit: 4: Min assist;With upper extremity assist;To chair/3-in-1 Stand to Sit Details: cues for hand placement and assist for controlled descent to 3 in 1 Stand Pivot Transfers: 4: Min assist;With armrests;From elevated surface Stand Pivot Transfer Details (indicate cue type and reason): Assist to stablize pt in standing due to weakness. Ambulation/Gait Ambulation/Gait: Yes Ambulation/Gait Assistance: 4: Min assist Ambulation/Gait Assistance Details (indicate cue type and reason): assist for dynamic stability and weakness.   Ambulation Distance (Feet): 10 Feet Assistive device: 1 person hand held assist Gait Pattern: Decreased stride length Gait velocity: decreased Stairs: No Wheelchair Mobility Wheelchair Mobility: No  Posture/Postural Control Posture/Postural Control: Postural limitations Postural Limitations: pt present with excessive cervical flexion in sitting and standing .  Balance Balance Assessed: Yes Static Sitting Balance Static Sitting - Balance Support: Bilateral upper extremity supported;Feet supported Static Sitting - Level of Assistance: 4: Min assist Static Sitting - Comment/# of Minutes: 5 Exercise    End of Session PT - End of Session Equipment Utilized During Treatment: Gait belt Activity Tolerance: Patient limited by fatigue;Other (comment) Patient left: Other (comment) (on 3 in 1 with RN and son) Nurse Communication: Mobility status for transfers;Mobility status for ambulation General Behavior During Session:  Lethargic Cognition: WFL for tasks performed  Davetta Olliff L. Rick Warnick DPT [phone] Alferd Apa 12/25/2010, 6:39 PM

## 2010-12-25 NOTE — Progress Notes (Signed)
Subjective: Interval History: none.  Objective: Vital signs in last 24 hours: Temp:  [98.2 F (36.8 C)-99.9 F (37.7 C)] 98.2 F (36.8 C) (12/20 0730) Pulse Rate:  [84-103] 102  (12/20 1000) Resp:  [18-25] 24  (12/20 1000) BP: (128-156)/(62-90) 138/72 mmHg (12/20 1000) SpO2:  [96 %-99 %] 99 % (12/20 0730) Weight:  [44.7 kg (98 lb 8.7 oz)-45.7 kg (100 lb 12 oz)] 100 lb 12 oz (45.7 kg) (12/20 0730) Weight change: -0.1 kg (-3.5 oz)  Intake/Output from previous day: 12/19 0701 - 12/20 0700 In: 180 [P.O.:180] Out: -  Intake/Output this shift:    General appearance: pale and sleepy Resp: rales bibasilar Cardio: S1, S2 normal, systolic murmur: systolic ejection 3/6, harsh at 2nd left intercostal space and Gr 2/6 dia M, prematures GI: live down 4 cm Extremities: avf LUA  Lab Results:  [facility] 12/25/10 0800 12/23/10 0537  WBC 5.1 4.8  HGB 10.4* 9.5*  HCT 32.0* 29.3*  PLT 140* 101*   BMET:  Basename 12/25/10 0800 12/23/10 0537  NA 130* 130*  K 3.6 4.3  CL 93* 94*  CO2 25 25  GLUCOSE 113* 106*  BUN 54* 59*  CREATININE 4.29* 4.29*  CALCIUM 7.9* 7.4*   No results found for this basename: PTH:2 in the last 72 hours Iron Studies: No results found for this basename: IRON,TIBC,TRANSFERRIN,FERRITIN in the last 72 hours  Studies/Results: No results found.  I have reviewed the patient's current medications.  Assessment/Plan: 1 ESRD on HD stable 2 AS/AI stabel 3 Anemia on EPO/ FE 4 HPTH stable 5 DEBILlL primary issue p HD, MEDs, dispo    LOS: 13 days   Yardley Beltran L 12/25/2010,10:04 AM

## 2010-12-25 NOTE — Progress Notes (Signed)
Clinical Social Worker spoke with [employer] who stated that pt insurance authorization was submitted yesterday and the facility followed up with insurance today in regard to insurance authorization and insurance company requesting up to date PT/OT notes. Clinical Social Worker sent facility the most recent PT/OT notes and contacted physical therapy department and left a message to request pt to be seen again today in order to send insurance company notes. Clinical Social Worker to continue to follow for SNF placement and await insurance authorization for SNF placement. MD aware that Clinical Social Worker continues to await insurance authorization.  [name], MSW, LCSWA  Clinical Social Work [phone]

## 2010-12-25 NOTE — Progress Notes (Signed)
PT Cancellation Note:  12/25/2010  Pt was in Hemodialysis all morning and declined PT this afternoon stating she was too tired.   Zi Sek L. Leverett Camplin DPT [phone]

## 2010-12-25 NOTE — Progress Notes (Signed)
Subjective: No new issues.  Objective: Vital signs in last 24 hours: Temp:  [97.9 F (36.6 C)-99.9 F (37.7 C)] 97.9 F (36.6 C) (12/20 1155) Pulse Rate:  [84-104] 104  (12/20 1155) Resp:  [18-25] 22  (12/20 1155) BP: (115-156)/(54-90) 155/84 mmHg (12/20 1155) SpO2:  [96 %-100 %] 100 % (12/20 1155) Weight:  [43.7 kg (96 lb 5.5 oz)-45.7 kg (100 lb 12 oz)] 96 lb 5.5 oz (43.7 kg) (12/20 1155) Weight change: -0.1 kg (-3.5 oz) Last BM Date: 12/24/10  Intake/Output from previous day: 12/19 0701 - 12/20 0700 In: 180 [P.O.:180] Out: -  Total I/O In: -  Out: 1823 [Other:1823]   Physical Exam: General: Comfortable, on HD, alert, communicative, fully oriented, not short of breath at rest.  HEENT:  Mild clinical pallor, no jaundice, no conjunctival injection or discharge. NECK:  Supple, JVP not seen, no carotid bruits, no palpable lymphadenopathy, no palpable goiter. CHEST:  Clinically clear to auscultation, no wheezes, no crackles. HEART:  Sounds 1 and 2 heard, normal, regular, no murmurs. ABDOMEN:  Full, soft, no scars, non-tender, no palpable organomegaly, no palpable masses, normal bowel sounds. GENITALIA:  Not examined. LOWER EXTREMITIES:  No pitting edema, palpable peripheral pulses. MUSCULOSKELETAL SYSTEM:  Generalized osteoarthritic changes, otherwise, normal. CENTRAL NERVOUS SYSTEM:  No focal neurologic deficit on gross examination.  Lab Results:  [facility] 12/25/10 0800 12/23/10 0537  WBC 5.1 4.8  HGB 10.4* 9.5*  HCT 32.0* 29.3*  PLT 140* 101*    Basename 12/25/10 0800 12/23/10 0537  NA 130* 130*  K 3.6 4.3  CL 93* 94*  CO2 25 25  GLUCOSE 113* 106*  BUN 54* 59*  CREATININE 4.29* 4.29*  CALCIUM 7.9* 7.4*   Recent Results (from the past 240 hour(s))  CULTURE, BLOOD (ROUTINE X 2)     Status: Normal   Collection Time   12/16/10  1:32 AM      Component Value Range Status Comment   Specimen Description BLOOD LEFT HAND   Final    Special Requests BOTTLES DRAWN  AEROBIC ONLY 5CC   Final    Setup Time [id number]   Final    Culture NO GROWTH 5 DAYS   Final    Report Status 12/22/2010 FINAL   Final   CULTURE, BLOOD (ROUTINE X 2)     Status: Normal   Collection Time   12/16/10  1:36 AM      Component Value Range Status Comment   Specimen Description BLOOD LEFT ARM   Final    Special Requests BOTTLES DRAWN AEROBIC AND ANAEROBIC [facility] EACH   Final    Setup Time [id number]   Final    Culture NO GROWTH 5 DAYS   Final    Report Status 12/22/2010 FINAL   Final   CULTURE, BLOOD (ROUTINE X 2)     Status: Normal (Preliminary result)   Collection Time   12/21/10  7:22 PM      Component Value Range Status Comment   Specimen Description BLOOD LEFT ARM   Final    Special Requests BOTTLES DRAWN AEROBIC AND ANAEROBIC 10 CC   Final    Setup Time [id number]   Final    Culture     Final    Value:        BLOOD CULTURE RECEIVED NO GROWTH TO DATE CULTURE WILL BE HELD FOR 5 DAYS BEFORE ISSUING A FINAL NEGATIVE REPORT   Report Status PENDING   Incomplete   CULTURE, BLOOD (ROUTINE X 2)  Status: Normal (Preliminary result)   Collection Time   12/21/10  7:32 PM      Component Value Range Status Comment   Specimen Description BLOOD RIGHT ARM   Final    Special Requests BOTTLES DRAWN AEROBIC AND ANAEROBIC 10 CC   Final    Setup Time [id number]   Final    Culture     Final    Value:        BLOOD CULTURE RECEIVED NO GROWTH TO DATE CULTURE WILL BE HELD FOR 5 DAYS BEFORE ISSUING A FINAL NEGATIVE REPORT   Report Status PENDING   Incomplete   SURGICAL PCR SCREEN     Status: Normal   Collection Time   12/22/10  6:35 AM      Component Value Range Status Comment   MRSA, PCR NEGATIVE  NEGATIVE  Final    Staphylococcus aureus NEGATIVE  NEGATIVE  Final      Studies/Results: No results found.  Medications: Scheduled Meds:    . amLODipine  10 mg Oral QHS  . calcium carbonate  1 tablet Oral TID WC  . darbepoetin (ARANESP) injection - DIALYSIS  100 mcg  Intravenous Q Tue-HD  . estradiol  0.5 mg Oral Daily  . feeding supplement  30 mL Oral BID  . ferric glucontate (NULECIT) IV  125 mg Intravenous Q T,Th,Sa-HD  . glipiZIDE  5 mg Oral QAC breakfast  . heparin  40 Units/kg Dialysis Once in dialysis  . insulin aspart  0-15 Units Subcutaneous TID WC  . insulin aspart  0-5 Units Subcutaneous QHS  . levothyroxine  37.5 mcg Oral Custom  . levothyroxine  75 mcg Oral Custom  . magic mouthwash  5 mL Oral QID  . metoprolol  25 mg Oral BID  . multivitamin  1 tablet Oral QHS  . paricalcitol  1 mcg Intravenous 3 times weekly  . polyethylene glycol  17 g Oral Daily  . simvastatin  20 mg Oral QHS  . DISCONTD: cloNIDine  0.1 mg Oral BID  . DISCONTD: losartan  50 mg Oral QHS   Continuous Infusions:  PRN Meds:.sodium chloride, acetaminophen, acetaminophen, feeding supplement (NEPRO CARB STEADY), fentaNYL, heparin, heparin, heparin, heparin, heparin, heparin, HYDROmorphone, lidocaine, lidocaine-prilocaine, ondansetron (ZOFRAN) IV, ondansetron, oxyCODONE, pentafluoroprop-tetrafluoroeth, promethazine, zolpidem  Assessment/Plan: 1. Urinary tract infection: VRE. Fully treated with Vancomycin. 2. Thrombocytopenia: Patient is not on prophylactic doses of heparin or Lovenox. Appears stable, and practically resolved. 3. End-stage renal disease: Per nephrology. Now on HD T-T-S. Status post LUE AVF on 12/22/10, by Dr Imogene Burn. Followup with Dr. Imogene Burn in 4 weeks. 4. Diabetes mellitus type 2: Reasonably controlled. Glipizide restarted on 12/24/10. Continue sliding scale insulin.  5. Anemia: Stable. 6. Status post AV graft. See above.  7. Status post acute encephalopathy: Resolved. Secondary to urinary tract infection and renal failure.    Comment: Awaiting paperwork. For  Discharge ASAP.   LOS: 13 days   Anandi Abramo,[name] 12/25/2010, 12:29 PM

## 2010-12-25 NOTE — Progress Notes (Signed)
Nutrition Follow-up  Pt eating fair, 10 - 50%. Continues on supplements.  Diet Order:  Renal 60/70 Supplements: Nepro prn, 30 ml Prostat BID  Meds: Scheduled Meds:   . amLODipine  10 mg Oral QHS  . calcium carbonate  1 tablet Oral TID WC  . darbepoetin (ARANESP) injection - DIALYSIS  100 mcg Intravenous Q Tue-HD  . estradiol  0.5 mg Oral Daily  . feeding supplement  30 mL Oral BID  . ferric glucontate (NULECIT) IV  125 mg Intravenous Q T,Th,Sa-HD  . glipiZIDE  5 mg Oral QAC breakfast  . heparin  40 Units/kg Dialysis Once in dialysis  . insulin aspart  0-15 Units Subcutaneous TID WC  . insulin aspart  0-5 Units Subcutaneous QHS  . levothyroxine  37.5 mcg Oral Custom  . levothyroxine  75 mcg Oral Custom  . magic mouthwash  5 mL Oral QID  . metoprolol  25 mg Oral BID  . multivitamin  1 tablet Oral QHS  . paricalcitol  1 mcg Intravenous 3 times weekly  . polyethylene glycol  17 g Oral Daily  . simvastatin  20 mg Oral QHS  . DISCONTD: cloNIDine  0.1 mg Oral BID  . DISCONTD: losartan  50 mg Oral QHS   Continuous Infusions:  PRN Meds:.sodium chloride, acetaminophen, acetaminophen, feeding supplement (NEPRO CARB STEADY), fentaNYL, heparin, heparin, heparin, heparin, heparin, heparin, HYDROmorphone, lidocaine, lidocaine-prilocaine, ondansetron (ZOFRAN) IV, ondansetron, oxyCODONE, pentafluoroprop-tetrafluoroeth, promethazine, zolpidem  Labs:  CMP     Component Value Date/Time   NA 130* 12/25/2010 0800   K 3.6 12/25/2010 0800   CL 93* 12/25/2010 0800   CO2 25 12/25/2010 0800   GLUCOSE 113* 12/25/2010 0800   GLUCOSE 144* 01/13/2006 0000   BUN 54* 12/25/2010 0800   CREATININE 4.29* 12/25/2010 0800   CALCIUM 7.9* 12/25/2010 0800   PROT 6.1 12/23/2010 0537   ALBUMIN 1.7* 12/25/2010 0800   AST 66* 12/23/2010 0537   ALT 36* 12/23/2010 0537   ALKPHOS 356* 12/23/2010 0537   BILITOT 0.4 12/23/2010 0537   GFRNONAA 8* 12/25/2010 0800   GFRAA 10* 12/25/2010 0800  Phosphorus 3.1  CBG  (last 3)   Basename 12/24/10 2126 12/24/10 1632 12/24/10 1222  GLUCAP 244* 335* 182*     Intake/Output Summary (Last 24 hours) at 12/25/10 1129 Last data filed at 12/25/10 2130  Gross per 24 hour  Intake     60 ml  Output      0 ml  Net     60 ml    Weight Status:  45.7 kg. Wt trending down.  Nutrition Dx:  Increased nutrient needs - persists.  Goal:  Pt to consume on average >75% of meals and supplements. Unmet.  Intervention:   1. Encourage PO intake of meals. Offer appropriate alternatives as able. 2. Provide supplements to encourage additional PO intake. 3. RD to continue to follow nutrition care plan  Monitor:  PO intake, weights, labs  [name] Pager #:  [phone]

## 2010-12-25 NOTE — Progress Notes (Signed)
12/25/2010 [facility], Bosie Clos [facility] Case Management Note [phone]    CARE MANAGEMENT NOTE 12/25/2010  Patient:  [name], [name]   Account Number:  [id number]  Date Initiated:  12/22/2010  Documentation initiated by:  [facility]  Subjective/Objective Assessment:   Admitted with UTI, thrombocytopenia.     Action/Plan:   PT/OT evals-recommending SNF   Anticipated DC Date:  12/23/2010   Anticipated DC Plan:  SKILLED NURSING FACILITY  In-house referral  Clinical Social Worker      DC Planning Services  CM consult      Choice offered to / List presented to:             Status of service:  In process, will continue to follow Medicare Important Message given?   (If response is "NO", the following Medicare IM given date fields will be blank) Date Medicare IM given:   Date Additional Medicare IM given:    Discharge Disposition:    Per UR Regulation:  Reviewed for med. necessity/level of care/duration of stay  Comments:  PCP Dr. Tillman Abide  12/25/10-1400-J.Yunuen Mordan,RN,BSN [phone]      Awaiting insurance approval for discharge to [facility]. No further discharge needs identified at this time.  12/22/10 Patient had left brachiocephalic av fistula placement today. Planning discharge to SNF. Will cont to follow. Jacquelynn Cree RN, BVSN, CCM

## 2010-12-26 LAB — GLUCOSE, CAPILLARY

## 2010-12-26 MED ORDER — HEPARIN SODIUM (PORCINE) 1000 UNIT/ML DIALYSIS: 40.0000 [IU]/kg | Freq: Once | INTRAMUSCULAR | Status: DC

## 2010-12-26 MED ORDER — HEPARIN SODIUM (PORCINE) 1000 UNIT/ML DIALYSIS
100.0000 [IU]/kg | Freq: Once | INTRAMUSCULAR | Status: DC
  Filled 2010-12-26: qty 5

## 2010-12-26 MED ORDER — HEPARIN SODIUM (PORCINE) 1000 UNIT/ML DIALYSIS
100.0000 [IU]/kg | INTRAMUSCULAR | Status: DC | PRN
  Filled 2010-12-26: qty 5

## 2010-12-26 NOTE — Progress Notes (Signed)
Clinical Social Worker received insurance authorization for pt to be d/c to SNF @ 11:34 am. MD and facility notified at this time. Clinical Social Worker received phone call at 2 pm from MD requesting information on the absolute latest that the facility can receive paperwork and if pt could be admitted during weekend. Clinical Social Worker contacted [employer] immediately after speaking with MD and facility stated that the absolute latest pt paperwork could be received was 4 pm to ensure that pt medications were at facility when pt arrived and regardless to rather pt discharged today or tomorrow, pt discharge paperwork would need to be received by 4pm. Clinical Social Worker notified MD. Clinical Social Worker received phone call from MD at approximately 3:45 pm stating that due to severe time restraints pt paperwork will not be completed by 4 pm and requested this Clinical Social Worker to contact facility to inquire about if the paperwork could be sent by 4:30pm. Clinical Social Worker contacted facility, who stated that pt discharge paperwork could not be accepted any later that 4 pm. MD notified. Clinical Social Worker made multiple attempts to contact pt insurance to discuss delay in discharge. Clinical Social Worker eventually spoke with insurance company who stated that they are open on Monday, December 24 and to contact insurance company on that day in regard to insurance authorization. Clinical Social Worker to [employer] company on Monday to ensure pt insurance authorization approves pt discharge to SNF on Monday. Clinical Social Worker notified pt son about delay in discharge. Clinical Social Worker is hopeful to facilitate pt D/C needs on Monday once Clinical Social Worker ensures that pt is still authorized by insurance company to be discharged to SNF.  [name], MSW, LCSWA  Clinical Social Work [phone]

## 2010-12-26 NOTE — Discharge Summary (Signed)
Physician Discharge Summary  Patient ID: [name] MRN: [id number] DOB/AGE: 1920/01/06 77 y.o.  Admit date: 12/12/2010 Discharge date: 12/26/2010  Primary Care Physician:  Tillman Abide, MD, MD   Discharge Diagnoses:    Patient Active Problem List  Diagnoses  . HYPOTHYROIDISM  . DIABETES MELLITUS, TYPE II  . HYPERLIPIDEMIA  . ANEMIA-NOS  . ANXIETY  . HYPERTENSION  . CORONARY ARTERY DISEASE  . PULMONARY HYPERTENSION  . AORTIC STENOSIS  . CAROTID ARTERY DISEASE  . PAROTIDITIS  . GERD  . NEURODERMATITIS  . OSTEOARTHRITIS  . OSTEOPENIA  . URINARY INCONTINENCE  . COLONIC POLYPS, HX OF  . Hypoglycemia  . Constipation, other cause  . Edema  . Arterial insufficiency-lower  . S/P aortic valve replacement  . Hypothyroidism  . Coronary artery disease  . Anemia  . Hypocalcemia  . Hyperphosphatemia  . Encephalopathy acute  . UTI (lower urinary tract infection)  . Thrombocytopenia  . End stage renal disease  . Anemia of chronic kidney failure    Current Discharge Medication List    CONTINUE these medications which have NOT CHANGED   Details  acetaminophen (TYLENOL) 500 MG tablet Take 500 mg by mouth every 6 (six) hours as needed. For pain     ALPRAZolam (XANAX) 0.5 MG tablet Take 0.25 mg by mouth at bedtime as needed. For anxiety    amLODipine (NORVASC) 10 MG tablet Take 1 tablet (10 mg total) by mouth daily. Qty: 30 tablet, Refills: 11    Ascorbic Acid (VITAMIN C) 500 MG CHEW Chew 1 tablet by mouth daily.      aspirin 81 MG tablet Take 81 mg by mouth daily.      calcium carbonate (OS-CAL - DOSED IN MG OF ELEMENTAL CALCIUM) 1250 MG tablet Take 1 tablet (500 mg of elemental calcium total) by mouth 3 (three) times daily with meals. Qty: 90 tablet, Refills: 0    cloNIDine (CATAPRES) 0.2 MG tablet Take 0.2 mg by mouth 3 (three) times daily.     Diphenhyd-Hydrocort-Nystatin (FIRST-DUKES MOUTHWASH) SUSP Use as directed 5 mLs in the mouth or throat 4 (four) times  daily as needed. Qty: 237 mL, Refills: 1    estradiol (ESTRACE) 0.5 MG tablet Take 0.5 mg by mouth daily.      ferrous fumarate (HEMOCYTE - 106 MG FE) 325 (106 FE) MG TABS Take 1 tablet by mouth daily.      folic acid (FOLVITE) 0.5 MG tablet Take 0.5 tablets (0.5 mg total) by mouth daily. Qty: 30 tablet, Refills: 0    glipiZIDE (GLUCOTROL) 5 MG tablet Take 0.5 tablets (2.5 mg total) by mouth daily. Qty: 30 tablet, Refills: 5    levothyroxine (SYNTHROID, LEVOTHROID) 75 MCG tablet Take 37.5-75 mcg by mouth daily. Take 37.5 mg on tues and thurs, 75 mg all other days    metoprolol (LOPRESSOR) 50 MG tablet Take 1.5 tablets (75 mg total) by mouth 2 (two) times daily. Qty: 90 tablet, Refills: 11    simvastatin (ZOCOR) 20 MG tablet Take 1 tablet (20 mg total) by mouth at bedtime. Qty: 30 tablet, Refills: 11    sodium bicarbonate 650 MG tablet Take 2 tablets (1,300 mg total) by mouth 3 (three) times daily. Qty: 90 tablet, Refills: 1         Disposition and Follow-up:  Follow up with Primary MD and Nephrologist.  Consults:  nephrology and vascular surgery  Malen Gauze, nephrologist. Durene Cal, Vascular Surgeon.  Significant Diagnostic Studies:  Dg Chest 2 View  12/12/2010  *RADIOLOGY REPORT*  Clinical Data: Worsening hypoxia, cough, and confusion. Hypertension and diabetes.  Coronary artery disease.  CHEST - 2 VIEW  Comparison: 11/27/2010  Findings: Worsening diffuse bilateral pulmonary air space disease is seen since previous study, consistent with pulmonary edema. This is superimposed on COPD.  Tiny pleural effusions are seen bilaterally.  Cardiomegaly remains stable.  The patient has undergone previous CABG and aortic valve replacement.  IMPRESSION:  1.  Worsening diffuse pulmonary edema/congestive heart failure, superimposed on COPD. 2.  Tiny bilateral pleural effusions. 3.  Stable cardiomegaly.  Original Report Authenticated By: Danae Orleans, M.D.    Brief H and P: For  complete details, refer to admission H and P. However,  in brief, this is an 77 year old female, who presented with increased confusion and lethargy. She was last seen normal on Dec 6th, and the son reports that patient has been eating and drinking well, he states that she has been undergoing a renal workup due to worsening of her kidney numbers over the past few months. She was subsequently admitted for acute encephalopathy, urinary tract infection and end-stage renal disease.    Physical Exam: On 12/26/10.  General: Comfortable, on HD, alert, communicative, fully oriented, not short of breath at rest.  HEENT: Mild clinical pallor, no jaundice, no conjunctival injection or discharge.  NECK: Supple, JVP not seen, no carotid bruits, no palpable lymphadenopathy, no palpable goiter.  CHEST: Clinically clear to auscultation, no wheezes, no crackles.  HEART: Sounds 1 and 2 heard, normal, regular, no murmurs.  ABDOMEN: Full, soft, no scars, non-tender, no palpable organomegaly, no palpable masses, normal bowel sounds.  GENITALIA: Not examined.  LOWER EXTREMITIES: No pitting edema, palpable peripheral pulses.  MUSCULOSKELETAL SYSTEM: Generalized osteoarthritic changes, otherwise, normal.  CENTRAL NERVOUS SYSTEM: No focal neurologic deficit on gross examination.   Hospital Course: 1. Patient presented as described above. Urine cultures confirmed UTI, secondary to VRE. She was fully treated with Vancomycin. 2. Thrombocytopenia: This persisted for most of patient's hospitalization, but gradually improved. Patient is not on prophylactic doses of heparin or Lovenox. Appears stable, and practically resolved at 140 on 12/26/10. 3. End-stage renal disease: Patients renal status deteriorated further, and eventually necessitated commencement of hemodialysis, which she tolerated well, scheduled for T-T-S. She is status post LUE AVF on 12/22/10, by Dr Imogene Burn., and will followup with Dr. Imogene Burn in 4 weeks. 4. Diabetes  mellitus type 2: This was reasonably controlled with a combination of Glipizide, diet and sliding scale insulin. 5. Anemia: Stable: Due to chronic disease. This has remained stable. 6. Status post acute encephalopathy: Patient did have altered mental status initially, due to urinary tract infection and renal failure., but this has now resolved.   Comment: patient is considered clinically stable for discharge.  Time spent on Discharge: 45 mins  Signed: Corra Kaine,[name] 12/26/2010, 3:48 PM

## 2010-12-26 NOTE — Progress Notes (Signed)
See discharge summary of 12/26/10.  Patient appears clinically stable for discharge, albeit rather frail and deconditioned and with severe protein-calorie malnutrition. Could not be discharged today, due to severe time constraints.   Comment: Will aim discharge on 12/29/10.  C. Kadon Andrus. MD.

## 2010-12-26 NOTE — Progress Notes (Signed)
Subjective: Interval History: none.  Objective: Vital signs in last 24 hours: Temp:  [97.9 F (36.6 C)-100.5 F (38.1 C)] 98.8 F (37.1 C) (12/21 1000) Pulse Rate:  [96-107] 96  (12/21 1000) Resp:  [17-24] 20  (12/21 1000) BP: (115-166)/(60-84) 154/73 mmHg (12/21 1000) SpO2:  [90 %-100 %] 97 % (12/21 1000) Weight:  [43.7 kg (96 lb 5.5 oz)] 96 lb 5.5 oz (43.7 kg) (12/20 1155) Weight change: 1 kg (2 lb 3.3 oz)  Intake/Output from previous day: 12/20 0701 - 12/21 0700 In: 240 [P.O.:240] Out: 1829 [Urine:2; Emesis/NG output:4] Intake/Output this shift: Total I/O In: 240 [P.O.:240] Out: -   General appearance: alert, cooperative and cachectic Resp: rhonchi bilaterally Chest wall: no tenderness Cardio: S1, S2 normal, systolic murmur: systolic ejection 3/6, harsh at 2nd left intercostal space and diastolic murmur: early diastolic 2/6, blowing at 2nd left intercostal space GI: liver down 4 cm  Lab Results:  Basename 12/25/10 0800  WBC 5.1  HGB 10.4*  HCT 32.0*  PLT 140*   BMET:  Basename 12/25/10 0800  NA 130*  K 3.6  CL 93*  CO2 25  GLUCOSE 113*  BUN 54*  CREATININE 4.29*  CALCIUM 7.9*   No results found for this basename: PTH:2 in the last 72 hours Iron Studies: No results found for this basename: IRON,TIBC,TRANSFERRIN,FERRITIN in the last 72 hours  Studies/Results: No results found.  I have reviewed the patient's current medications.  Assessment/Plan: 1 ESRD HD TTS stable 2 AS/AI 3 Anemia EPO/fe 4 HPTH stable 5 Temps follow 6 Malnutrition suppl 5 Debill P mobilize,HD, meds   LOS: 14 days   Jaime Grizzell L 12/26/2010,11:05 AM

## 2010-12-27 ENCOUNTER — Inpatient Hospital Stay (HOSPITAL_COMMUNITY): Payer: Medicare Other

## 2010-12-27 LAB — CBC
Hemoglobin: 10.5 g/dL — ABNORMAL LOW (ref 12.0–15.0)
MCH: 28.1 pg (ref 26.0–34.0)
MCV: 86.6 fL (ref 78.0–100.0)
RBC: 3.74 MIL/uL — ABNORMAL LOW (ref 3.87–5.11)
WBC: 7.3 10*3/uL (ref 4.0–10.5)

## 2010-12-27 LAB — GLUCOSE, CAPILLARY: Glucose-Capillary: 63 mg/dL — ABNORMAL LOW (ref 70–99)

## 2010-12-27 LAB — RENAL FUNCTION PANEL
CO2: 25 mEq/L (ref 19–32)
Calcium: 8.1 mg/dL — ABNORMAL LOW (ref 8.4–10.5)
Chloride: 89 mEq/L — ABNORMAL LOW (ref 96–112)
GFR calc Af Amer: 11 mL/min — ABNORMAL LOW (ref >90)
Glucose, Bld: 145 mg/dL — ABNORMAL HIGH (ref 70–99)
Sodium: 126 mEq/L — ABNORMAL LOW (ref 135–145)

## 2010-12-27 MED ORDER — METOPROLOL TARTRATE 50 MG PO TABS
50.0000 mg | ORAL_TABLET | Freq: Two times a day (BID) | ORAL | Status: DC
  Administered 2010-12-27 – 2010-12-29 (×4): 50 mg via ORAL
  Filled 2010-12-27 (×5): qty 1

## 2010-12-27 MED ORDER — PARICALCITOL 5 MCG/ML IV SOLN
INTRAVENOUS | Status: AC
  Administered 2010-12-27: 1 ug via INTRAVENOUS
  Filled 2010-12-27: qty 1

## 2010-12-27 MED ORDER — GLUCOSE 40 % PO GEL
ORAL | Status: AC
  Administered 2010-12-27: 37.5 g
  Filled 2010-12-27: qty 1

## 2010-12-27 NOTE — Progress Notes (Signed)
Subjective: No new issues.  Objective: Vital signs in last 24 hours: Temp:  [97.3 F (36.3 C)-99.2 F (37.3 C)] 98.4 F (36.9 C) (12/22 1335) Pulse Rate:  [77-104] 104  (12/22 1335) Resp:  [17-19] 17  (12/22 1335) BP: (159-174)/(71-74) 167/71 mmHg (12/22 1335) SpO2:  [95 %-100 %] 95 % (12/22 1335) Weight:  [42.8 kg (94 lb 5.7 oz)] 94 lb 5.7 oz (42.8 kg) (12/21 2021) Weight change: -2.9 kg (-6 lb 6.3 oz) Last BM Date: 12/25/10  Intake/Output from previous day: 12/21 0701 - 12/22 0700 In: 720 [P.O.:720] Out: 2 [Stool:2] Total I/O In: 600 [P.O.:600] Out: -    Physical Exam: General: Comfortable,sleeping, but easily rousable, communicative, fully oriented, not short of breath at rest.  HEENT:  Mild clinical pallor, no jaundice, no conjunctival injection or discharge. NECK:  Supple, JVP not seen, no carotid bruits, no palpable lymphadenopathy, no palpable goiter. CHEST:  Clinically clear to auscultation, no wheezes, no crackles. HEART:  Sounds 1 and 2 heard, normal, regular, no murmurs. ABDOMEN:  Full, soft, non-tender, no palpable organomegaly, no palpable masses, normal bowel sounds. GENITALIA:  Not examined. LOWER EXTREMITIES:  No pitting edema, palpable peripheral pulses. MUSCULOSKELETAL SYSTEM:  Generalized osteoarthritic changes, otherwise, normal. CENTRAL NERVOUS SYSTEM:  No focal neurologic deficit on gross examination.  Lab Results:  Basename 12/25/10 0800  WBC 5.1  HGB 10.4*  HCT 32.0*  PLT 140*    Basename 12/25/10 0800  NA 130*  K 3.6  CL 93*  CO2 25  GLUCOSE 113*  BUN 54*  CREATININE 4.29*  CALCIUM 7.9*   Recent Results (from the past 240 hour(s))  CULTURE, BLOOD (ROUTINE X 2)     Status: Normal (Preliminary result)   Collection Time   12/21/10  7:22 PM      Component Value Range Status Comment   Specimen Description BLOOD LEFT ARM   Final    Special Requests BOTTLES DRAWN AEROBIC AND ANAEROBIC 10 CC   Final    Setup Time [id number]   Final      Culture     Final    Value:        BLOOD CULTURE RECEIVED NO GROWTH TO DATE CULTURE WILL BE HELD FOR 5 DAYS BEFORE ISSUING A FINAL NEGATIVE REPORT   Report Status PENDING   Incomplete   CULTURE, BLOOD (ROUTINE X 2)     Status: Normal (Preliminary result)   Collection Time   12/21/10  7:32 PM      Component Value Range Status Comment   Specimen Description BLOOD RIGHT ARM   Final    Special Requests BOTTLES DRAWN AEROBIC AND ANAEROBIC 10 CC   Final    Setup Time [id number]   Final    Culture     Final    Value:        BLOOD CULTURE RECEIVED NO GROWTH TO DATE CULTURE WILL BE HELD FOR 5 DAYS BEFORE ISSUING A FINAL NEGATIVE REPORT   Report Status PENDING   Incomplete   SURGICAL PCR SCREEN     Status: Normal   Collection Time   12/22/10  6:35 AM      Component Value Range Status Comment   MRSA, PCR NEGATIVE  NEGATIVE  Final    Staphylococcus aureus NEGATIVE  NEGATIVE  Final      Studies/Results: No results found.  Medications: Scheduled Meds:   . amLODipine  10 mg Oral QHS  . calcium carbonate  1 tablet Oral TID WC  . darbepoetin (  ARANESP) injection - DIALYSIS  100 mcg Intravenous Q Tue-HD  . dextrose      . estradiol  0.5 mg Oral Daily  . feeding supplement  30 mL Oral BID  . ferric glucontate (NULECIT) IV  125 mg Intravenous Q T,Th,Sa-HD  . glipiZIDE  5 mg Oral QAC breakfast  . heparin  40 Units/kg Dialysis Once in dialysis  . heparin  100 Units/kg Dialysis Once in dialysis  . insulin aspart  0-15 Units Subcutaneous TID WC  . insulin aspart  0-5 Units Subcutaneous QHS  . levothyroxine  37.5 mcg Oral Custom  . levothyroxine  75 mcg Oral Custom  . magic mouthwash  5 mL Oral QID  . metoprolol  25 mg Oral BID  . multivitamin  1 tablet Oral QHS  . paricalcitol  1 mcg Intravenous 3 times weekly  . polyethylene glycol  17 g Oral Daily  . simvastatin  20 mg Oral QHS   Continuous Infusions:  PRN Meds:.sodium chloride, acetaminophen, acetaminophen, feeding supplement (NEPRO  CARB STEADY), fentaNYL, heparin, heparin, heparin, heparin, heparin, heparin, heparin, HYDROmorphone, lidocaine, lidocaine-prilocaine, ondansetron (ZOFRAN) IV, ondansetron, oxyCODONE, pentafluoroprop-tetrafluoroeth, promethazine, zolpidem  Assessment/Plan:  1. Urinary tract infection: VRE. Fully treated with Vancomycin. 2. Thrombocytopenia: Patient is not on prophylactic doses of heparin or Lovenox. Appears stable, and practically resolved. 3. End-stage renal disease: Per nephrology. Now on HD T-T-S. Status post LUE AVF on 12/22/10, by Dr Imogene Burn. Followup with Dr. Imogene Burn in 4 weeks. 4. Diabetes mellitus type 2: Reasonably controlled. Glipizide restarted on 12/24/10. On sliding scale insulin.  5. Anemia: Stable. 6. Status post AV graft. See above.  7. Status post acute encephalopathy: Resolved. Secondary to urinary tract infection and renal failure.  8. HTN: BP is uncontrolled, in spite of beta-blocker and Norvasc to Rx. Will increase Lopressor to 50 mg b.i.d.  Comment: stable for discharge on 12/29/10. See Discharge summary of 12/26/10.   LOS: 15 days   Laci Frenkel,[name] 12/27/2010, 3:09 PM

## 2010-12-27 NOTE — Progress Notes (Signed)
Subjective: Interval History: none.  Objective: Vital signs in last 24 hours: Temp:  [98.2 F (36.8 C)-99.2 F (37.3 C)] 98.8 F (37.1 C) (12/22 0501) Pulse Rate:  [77-101] 77  (12/22 0501) Resp:  [18-20] 18  (12/22 0501) BP: (147-174)/(72-74) 161/74 mmHg (12/22 0501) SpO2:  [96 %-97 %] 97 % (12/22 0501) Weight:  [42.8 kg (94 lb 5.7 oz)] 94 lb 5.7 oz (42.8 kg) (12/21 2021) Weight change: -2.9 kg (-6 lb 6.3 oz)  Intake/Output from previous day: 12/21 0701 - 12/22 0700 In: 720 [P.O.:720] Out: 2 [Stool:2] Intake/Output this shift:    Resp: rales bilaterally, rhonchi bilaterally and wheezes bibasilar Chest wall: no tenderness, RIJ PC Cardio: S1, S2 normal, systolic murmur: systolic ejection 2/6, decrescendo at 2nd left intercostal space and diastolic murmur: early diastolic 2/6, blowing at 2nd left intercostal space GI: soft,pos bs, liver down 2 cm Extremities: AVF LUA  Lab Results:  Basename 12/25/10 0800  WBC 5.1  HGB 10.4*  HCT 32.0*  PLT 140*   BMET:  Basename 12/25/10 0800  NA 130*  K 3.6  CL 93*  CO2 25  GLUCOSE 113*  BUN 54*  CREATININE 4.29*  CALCIUM 7.9*   No results found for this basename: PTH:2 in the last 72 hours Iron Studies: No results found for this basename: IRON,TIBC,TRANSFERRIN,FERRITIN in the last 72 hours  Studies/Results: No results found.  I have reviewed the patient's current medications.  Assessment/Plan: 1 CRF for HD 2 Anemia stable on EPO, Fe 3 HPTH vit D 4 Malnutrit suppl 5 Debill  6 AS/AI PHD, epo, fe, Vit D, mobilize    LOS: 15 days   Daveda Larock L 12/27/2010,8:35 AM

## 2010-12-28 DIAGNOSIS — E871 Hypo-osmolality and hyponatremia: Secondary | ICD-10-CM

## 2010-12-28 LAB — GLUCOSE, CAPILLARY
Glucose-Capillary: 228 mg/dL — ABNORMAL HIGH (ref 70–99)
Glucose-Capillary: 104 mg/dL — ABNORMAL HIGH (ref 70–99)
Glucose-Capillary: 69 mg/dL — ABNORMAL LOW (ref 70–99)
Glucose-Capillary: 186 mg/dL — ABNORMAL HIGH (ref 70–99)

## 2010-12-28 LAB — CULTURE, BLOOD (ROUTINE X 2)
Culture  Setup Time: 201212170224
Culture: NO GROWTH

## 2010-12-28 MED ORDER — HEPARIN SODIUM (PORCINE) 1000 UNIT/ML DIALYSIS: 100.0000 [IU]/kg | INTRAMUSCULAR | Status: DC | PRN

## 2010-12-28 MED ORDER — NEOMYCIN-POLYMYXIN-DEXAMETH 3.5-10000-0.1 OP SUSP
2.0000 [drp] | Freq: Two times a day (BID) | OPHTHALMIC | Status: DC
  Administered 2010-12-28 – 2010-12-29 (×3): 2 [drp] via OPHTHALMIC
  Filled 2010-12-28: qty 5

## 2010-12-28 MED ORDER — HEPARIN SODIUM (PORCINE) 1000 UNIT/ML DIALYSIS
40.0000 [IU]/kg | Freq: Once | INTRAMUSCULAR | Status: AC
  Administered 2010-12-29: 1700 [IU] via INTRAVENOUS_CENTRAL

## 2010-12-28 NOTE — Progress Notes (Signed)
Subjective: No complaints. Patient sleeping. Patient wants a warm blanket.  Objective: Vital signs in last 24 hours: Temp:  [97.9 F (36.6 C)-100 F (37.8 C)] 98.7 F (37.1 C) (12/23 1334) Pulse Rate:  [99-124] 100  (12/23 1334) Resp:  [16-20] 17  (12/23 1334) BP: (142-186)/(55-101) 158/71 mmHg (12/23 1334) SpO2:  [92 %-97 %] 95 % (12/23 1334) Weight:  [42.4 kg (93 lb 7.6 oz)] 93 lb 7.6 oz (42.4 kg) (12/22 1916) Weight change: 2.1 kg (4 lb 10.1 oz) Last BM Date: 12/27/10  Intake/Output from previous day: 12/22 0701 - 12/23 0700 In: 800 [P.O.:800] Out: 2002  Total I/O In: 360 [P.O.:360] Out: -    Physical Exam: General: Comfortable,sleeping, but easily rousable, communicative, fully oriented, not short of breath at rest.  HEENT:  Mild clinical pallor, no jaundice, no conjunctival injection or discharge. NECK:  Supple, JVP not seen, no carotid bruits, no palpable lymphadenopathy, no palpable goiter. CHEST:  Clinically clear to auscultation, no wheezes, no crackles. HEART:  Sounds 1 and 2 heard, normal, regular, no murmurs. ABDOMEN:  Full, soft, non-tender, no palpable organomegaly, no palpable masses, normal bowel sounds LOWER EXTREMITIES:  No pitting edema, palpable peripheral pulses. MUSCULOSKELETAL SYSTEM:  Generalized osteoarthritic changes, otherwise, normal. CENTRAL NERVOUS SYSTEM:  No focal neurologic deficit on gross examination.  Lab Results:  [facility] 12/27/10 1533  WBC 7.3  HGB 10.5*  HCT 32.4*  PLT 175    Basename 12/27/10 1534  NA 126*  K 4.2  CL 89*  CO2 25  GLUCOSE 145*  BUN 39*  CREATININE 3.83*  CALCIUM 8.1*   Recent Results (from the past 240 hour(s))  CULTURE, BLOOD (ROUTINE X 2)     Status: Normal   Collection Time   12/21/10  7:22 PM      Component Value Range Status Comment   Specimen Description BLOOD LEFT ARM   Final    Special Requests BOTTLES DRAWN AEROBIC AND ANAEROBIC 10 CC   Final    Setup Time [id number]   Final    Culture  NO GROWTH 5 DAYS   Final    Report Status 12/28/2010 FINAL   Final   CULTURE, BLOOD (ROUTINE X 2)     Status: Normal   Collection Time   12/21/10  7:32 PM      Component Value Range Status Comment   Specimen Description BLOOD RIGHT ARM   Final    Special Requests BOTTLES DRAWN AEROBIC AND ANAEROBIC 10 CC   Final    Setup Time [id number]   Final    Culture NO GROWTH 5 DAYS   Final    Report Status 12/28/2010 FINAL   Final   SURGICAL PCR SCREEN     Status: Normal   Collection Time   12/22/10  6:35 AM      Component Value Range Status Comment   MRSA, PCR NEGATIVE  NEGATIVE  Final    Staphylococcus aureus NEGATIVE  NEGATIVE  Final      Studies/Results: No results found.  Medications: Scheduled Meds:    . amLODipine  10 mg Oral QHS  . calcium carbonate  1 tablet Oral TID WC  . darbepoetin (ARANESP) injection - DIALYSIS  100 mcg Intravenous Q Tue-HD  . estradiol  0.5 mg Oral Daily  . feeding supplement  30 mL Oral BID  . ferric glucontate (NULECIT) IV  125 mg Intravenous Q T,Th,Sa-HD  . glipiZIDE  5 mg Oral QAC breakfast  . heparin  40 Units/kg Dialysis Once  in dialysis  . heparin  40 Units/kg Dialysis Once in dialysis  . heparin  100 Units/kg Dialysis Once in dialysis  . insulin aspart  0-15 Units Subcutaneous TID WC  . insulin aspart  0-5 Units Subcutaneous QHS  . levothyroxine  37.5 mcg Oral Custom  . levothyroxine  75 mcg Oral Custom  . magic mouthwash  5 mL Oral QID  . metoprolol  50 mg Oral BID  . multivitamin  1 tablet Oral QHS  . neomycin-polymyxin b-dexamethasone  2 drop Both Eyes BID  . paricalcitol  1 mcg Intravenous 3 times weekly  . polyethylene glycol  17 g Oral Daily  . simvastatin  20 mg Oral QHS  . DISCONTD: metoprolol  25 mg Oral BID   Continuous Infusions:  PRN Meds:.sodium chloride, acetaminophen, acetaminophen, feeding supplement (NEPRO CARB STEADY), fentaNYL, heparin, heparin, heparin, heparin, heparin, heparin, heparin, heparin, HYDROmorphone,  lidocaine, lidocaine-prilocaine, ondansetron (ZOFRAN) IV, ondansetron, oxyCODONE, pentafluoroprop-tetrafluoroeth, promethazine, zolpidem  Assessment/Plan:  1. Urinary tract infection: VRE. Fully treated with Vancomycin. 2. Thrombocytopenia: Patient is not on prophylactic doses of heparin or Lovenox. Appears stable, and practically resolved. 3. End-stage renal disease: Per nephrology. Now on HD T-T-S. Status post LUE AVF on 12/22/10, by Dr Imogene Burn. Followup with Dr. Imogene Burn in 4 weeks. 4. Diabetes mellitus type 2: Reasonably controlled. Glipizide restarted on 12/24/10. On sliding scale insulin.  5. Anemia: Stable. 6. Status post AV graft. See above.  7. Status post acute encephalopathy: Resolved. Secondary to urinary tract infection and renal failure.  8. HTN: Continue Norvasc. Lopressor just increased will monitor on increased dose of Lopressor and titrate as needed. 9. Hyponatremia: Likely secondary to end-stage renal disease. Patient asymptomatic. Continue hemodialysis. Follow. Per renal.  See Discharge summary of 12/26/10.   LOS: 16 days   Kami Kube 12/28/2010, 2:52 PM

## 2010-12-28 NOTE — Progress Notes (Signed)
Subjective: Interval History: has complaints irritation of eyes.  Objective: Vital signs in last 24 hours: Temp:  [97.9 F (36.6 C)-100 F (37.8 C)] 97.9 F (36.6 C) (12/23 0953) Pulse Rate:  [99-124] 99  (12/23 0953) Resp:  [16-28] 17  (12/23 0953) BP: (142-186)/(55-101) 148/67 mmHg (12/23 0953) SpO2:  [92 %-97 %] 97 % (12/23 0953) Weight:  [42.4 kg (93 lb 7.6 oz)-44.9 kg (98 lb 15.8 oz)] 93 lb 7.6 oz (42.4 kg) (12/22 1916) Weight change: 2.1 kg (4 lb 10.1 oz)  Intake/Output from previous day: 12/22 0701 - 12/23 0700 In: 800 [P.O.:800] Out: 2002  Intake/Output this shift: Total I/O In: 240 [P.O.:240] Out: -   General appearance: alert, cooperative and pale Eyes: D/C from eyes Resp: rhonchi bilaterally Cardio: S1, S2 normal, systolic murmur: systolic ejection 3/6, decrescendo at 2nd left intercostal space, diastolic murmur: early diastolic 2/6, blowing at 2nd left intercostal space and prematures GI: liver down 3 am pos bs, soft Extremities: AVF LUA  Lab Results:  [facility] 12/27/10 1533  WBC 7.3  HGB 10.5*  HCT 32.4*  PLT 175   BMET:  Basename 12/27/10 1534  NA 126*  K 4.2  CL 89*  CO2 25  GLUCOSE 145*  BUN 39*  CREATININE 3.83*  CALCIUM 8.1*   No results found for this basename: PTH:2 in the last 72 hours Iron Studies: No results found for this basename: IRON,TIBC,TRANSFERRIN,FERRITIN in the last 72 hours  Studies/Results: No results found.  I have reviewed the patient's current medications.  Assessment/Plan: 1 ESRD stable, vol ok 2 Anemia epo & fe 3 Dm controlled 4AS/AI 5 Malnutrit suppl 6 Debill  P HD, mobilize, meds    LOS: 16 days   Nilah Belcourt L 12/28/2010,11:24 AM

## 2010-12-29 ENCOUNTER — Inpatient Hospital Stay (HOSPITAL_COMMUNITY): Payer: Medicare Other

## 2010-12-29 LAB — CBC
HCT: 33.3 % — ABNORMAL LOW (ref 36.0–46.0)
Hemoglobin: 10.5 g/dL — ABNORMAL LOW (ref 12.0–15.0)
MCH: 27.8 pg (ref 26.0–34.0)
MCHC: 31.5 g/dL (ref 30.0–36.0)
MCV: 88.1 fL (ref 78.0–100.0)
Platelets: 172 K/uL (ref 150–400)
RBC: 3.78 MIL/uL — ABNORMAL LOW (ref 3.87–5.11)
RDW: 15.1 % (ref 11.5–15.5)
WBC: 9.2 K/uL (ref 4.0–10.5)

## 2010-12-29 LAB — COMPREHENSIVE METABOLIC PANEL
AST: 54 U/L — ABNORMAL HIGH (ref 0–37)
Albumin: 1.7 g/dL — ABNORMAL LOW (ref 3.5–5.2)
Calcium: 8.1 mg/dL — ABNORMAL LOW (ref 8.4–10.5)
Creatinine, Ser: 2.96 mg/dL — ABNORMAL HIGH (ref 0.50–1.10)
Total Protein: 6.9 g/dL (ref 6.0–8.3)

## 2010-12-29 LAB — GLUCOSE, CAPILLARY: Glucose-Capillary: 83 mg/dL (ref 70–99)

## 2010-12-29 MED ORDER — POTASSIUM CHLORIDE CRYS ER 20 MEQ PO TBCR
40.0000 meq | EXTENDED_RELEASE_TABLET | ORAL | Status: AC
  Administered 2010-12-29 (×2): 40 meq via ORAL
  Filled 2010-12-29: qty 2

## 2010-12-29 MED ORDER — NEPRO/CARBSTEADY PO LIQD: 237.0000 mL | ORAL | Status: DC | PRN

## 2010-12-29 MED ORDER — PARICALCITOL 5 MCG/ML IV SOLN
INTRAVENOUS | Status: AC
  Administered 2010-12-29: 1 ug via INTRAVENOUS
  Filled 2010-12-29: qty 1

## 2010-12-29 MED ORDER — DARBEPOETIN ALFA-POLYSORBATE 100 MCG/0.5ML IJ SOLN: 100.0000 ug | INTRAMUSCULAR | Status: DC

## 2010-12-29 MED ORDER — GLIPIZIDE 5 MG PO TABS: 5.0000 mg | ORAL_TABLET | Freq: Every day | ORAL | Status: DC

## 2010-12-29 MED ORDER — NEOMYCIN-POLYMYXIN-DEXAMETH 3.5-10000-0.1 OP SUSP: 2.0000 [drp] | Freq: Two times a day (BID) | OPHTHALMIC | Status: DC

## 2010-12-29 MED ORDER — DARBEPOETIN ALFA-POLYSORBATE 100 MCG/0.5ML IJ SOLN
INTRAMUSCULAR | Status: AC
  Administered 2010-12-29: 100 ug via INTRAVENOUS
  Filled 2010-12-29: qty 0.5

## 2010-12-29 MED ORDER — PARICALCITOL 5 MCG/ML IV SOLN
1.0000 ug | INTRAVENOUS | Status: DC
  Filled 2010-12-29: qty 0.2

## 2010-12-29 NOTE — Progress Notes (Signed)
Clinical Social Worker facilitated pt D/C needs including contacting facility and family. Pt to be transported by pt son to [facility]. No further social work needs at this time.  [name], MSW, LCSWA  Clinical Social Work [phone]

## 2010-12-29 NOTE — Progress Notes (Signed)
PT Cancellation Note  Pt just back from hemodialysis, barely able to keep eyes open and very fatigued. Pt pleasantly declining PT. Will return later today if time allows. Thank you!  Sharonann Malbrough (Beverely Pace) Carleene Mains PT, DPT Acute Rehabilitation [phone]

## 2010-12-29 NOTE — Progress Notes (Signed)
Patient condition stable upon departure. 02 from receiving facility set on 2L - no SOB noted. Personal belongings sent with son. Family transported patient in personal vehicle to skilled nursing facility.

## 2010-12-29 NOTE — Progress Notes (Signed)
Clinical Social Worker spoke with pt insurance company who stated pt insurance approval from Friday for SNF is still active and pt can D/C to SNF today. Clinical Social Worker notified facility and MD. Clinical Social Worker to facilitate pt D/C needs.   [name], MSW, LCSWA  Clinical Social Work [phone]

## 2010-12-29 NOTE — Progress Notes (Signed)
Patient ID: [name], female   DOB: [birth year], 77 y.o.   MRN: [id number] I was present at this session.  I have reviewed the session itself and made appropriate changes.  Gilad Dugger L 12/24/20129:20 AM

## 2010-12-29 NOTE — Progress Notes (Addendum)
Subjective: No complaints.  Objective: Vital signs in last 24 hours: Temp:  [94.4 F (34.7 C)-98.7 F (37.1 C)] 98.3 F (36.8 C) (12/24 0955) Pulse Rate:  [84-113] 101  (12/24 0955) Resp:  [17-24] 20  (12/24 0955) BP: (123-174)/(58-78) 164/75 mmHg (12/24 0955) SpO2:  [90 %-98 %] 94 % (12/24 0955) Weight:  [42.5 kg (93 lb 11.1 oz)-43.7 kg (96 lb 5.5 oz)] 93 lb 11.1 oz (42.5 kg) (12/24 0917) Weight change: -1.8 kg (-3 lb 15.5 oz) Last BM Date: 12/28/10  Intake/Output from previous day: 12/23 0701 - 12/24 0700 In: 460 [P.O.:460] Out: -  Total I/O In: 120 [P.O.:120] Out: 1500 [Other:1500]   Physical Exam: General: Comfortable,sleeping, but easily rousable, communicative, fully oriented, not short of breath at rest.  NECK:  Supple, JVP not seen, no carotid bruits, no palpable lymphadenopathy, no palpable goiter. CHEST:  Clinically clear to auscultation, no wheezes, no crackles. HEART:  Sounds 1 and 2 heard, normal, regular, no murmurs. ABDOMEN:  Full, soft, non-tender, no palpable organomegaly, no palpable masses, normal bowel sounds LOWER EXTREMITIES:  No pitting edema, palpable peripheral pulses. MUSCULOSKELETAL SYSTEM:  Generalized osteoarthritic changes, otherwise, normal. CENTRAL NERVOUS SYSTEM:  No focal neurologic deficit on gross examination.  Lab Results:  [facility] 12/29/10 0518 12/27/10 1533  WBC 9.2 7.3  HGB 10.5* 10.5*  HCT 33.3* 32.4*  PLT 172 175    Basename 12/29/10 0518 12/27/10 1534  NA 130* 126*  K 3.2* 4.2  CL 93* 89*  CO2 26 25  GLUCOSE 126* 145*  BUN 34* 39*  CREATININE 2.96* 3.83*  CALCIUM 8.1* 8.1*   Recent Results (from the past 240 hour(s))  CULTURE, BLOOD (ROUTINE X 2)     Status: Normal   Collection Time   12/21/10  7:22 PM      Component Value Range Status Comment   Specimen Description BLOOD LEFT ARM   Final    Special Requests BOTTLES DRAWN AEROBIC AND ANAEROBIC 10 CC   Final    Setup Time [id number]   Final    Culture NO  GROWTH 5 DAYS   Final    Report Status 12/28/2010 FINAL   Final   CULTURE, BLOOD (ROUTINE X 2)     Status: Normal   Collection Time   12/21/10  7:32 PM      Component Value Range Status Comment   Specimen Description BLOOD RIGHT ARM   Final    Special Requests BOTTLES DRAWN AEROBIC AND ANAEROBIC 10 CC   Final    Setup Time [id number]   Final    Culture NO GROWTH 5 DAYS   Final    Report Status 12/28/2010 FINAL   Final   SURGICAL PCR SCREEN     Status: Normal   Collection Time   12/22/10  6:35 AM      Component Value Range Status Comment   MRSA, PCR NEGATIVE  NEGATIVE  Final    Staphylococcus aureus NEGATIVE  NEGATIVE  Final      Studies/Results: No results found.  Medications: Scheduled Meds:    . amLODipine  10 mg Oral QHS  . calcium carbonate  1 tablet Oral TID WC  . darbepoetin (ARANESP) injection - DIALYSIS  100 mcg Intravenous Q Tue-HD  . estradiol  0.5 mg Oral Daily  . feeding supplement  30 mL Oral BID  . ferric glucontate (NULECIT) IV  125 mg Intravenous Q T,Th,Sa-HD  . glipiZIDE  5 mg Oral QAC breakfast  . heparin  40 Units/kg  Dialysis Once in dialysis  . heparin  40 Units/kg Dialysis Once in dialysis  . heparin  100 Units/kg Dialysis Once in dialysis  . insulin aspart  0-15 Units Subcutaneous TID WC  . insulin aspart  0-5 Units Subcutaneous QHS  . levothyroxine  37.5 mcg Oral Custom  . levothyroxine  75 mcg Oral Custom  . magic mouthwash  5 mL Oral QID  . metoprolol  50 mg Oral BID  . multivitamin  1 tablet Oral QHS  . neomycin-polymyxin b-dexamethasone  2 drop Both Eyes BID  . paricalcitol  1 mcg Intravenous 3 times weekly  . polyethylene glycol  17 g Oral Daily  . potassium chloride  40 mEq Oral Q4H  . simvastatin  20 mg Oral QHS  . DISCONTD: paricalcitol  1 mcg Intravenous 3 times weekly   Continuous Infusions:  PRN Meds:.sodium chloride, acetaminophen, acetaminophen, feeding supplement (NEPRO CARB STEADY), fentaNYL, heparin, heparin, heparin,  heparin, heparin, heparin, heparin, heparin, HYDROmorphone, lidocaine, lidocaine-prilocaine, ondansetron (ZOFRAN) IV, ondansetron, oxyCODONE, pentafluoroprop-tetrafluoroeth, promethazine, zolpidem  Assessment/Plan:  1. Urinary tract infection: VRE. Fully treated with Vancomycin. 2. Thrombocytopenia: Patient is not on prophylactic doses of heparin or Lovenox. Appears stable, and practically resolved. 3. End-stage renal disease: Per nephrology. Now on HD T-T-S. Status post LUE AVF on 12/22/10, by Dr Imogene Burn. Followup with Dr. Imogene Burn in 4 weeks. Follow up as outpatient. 4. Diabetes mellitus type 2: Reasonably controlled. Glipizide restarted on 12/24/10. On sliding scale insulin.  5. Anemia: Stable. 6. Status post AV graft. See above.  7. Status post acute encephalopathy: Resolved. Secondary to urinary tract infection and renal failure.  8. HTN: Continue Norvasc. Lopressor just increased will monitor on increased dose of Lopressor and titrate as needed. 9. Hyponatremia: Likely secondary to end-stage renal disease. Patient asymptomatic. Continue hemodialysis. Follow. Per renal.  See Discharge summary of 12/26/10.   LOS: 17 days   [name] 12/29/2010, 11:53 AM    Patient to SNF today.

## 2010-12-29 NOTE — Progress Notes (Signed)
Subjective: Interval History: has complaints less congested.  Objective: Vital signs in last 24 hours: Temp:  [94.4 F (34.7 C)-98.7 F (37.1 C)] 97.7 F (36.5 C) (12/24 0502) Pulse Rate:  [84-113] 113  (12/24 0900) Resp:  [17-24] 20  (12/24 0900) BP: (123-166)/(58-78) 124/66 mmHg (12/24 0900) SpO2:  [90 %-98 %] 90 % (12/24 0502) Weight:  [43.1 kg (95 lb 0.3 oz)-43.7 kg (96 lb 5.5 oz)] 96 lb 5.5 oz (43.7 kg) (12/24 0502) Weight change: -1.8 kg (-3 lb 15.5 oz)  Intake/Output from previous day: 12/23 0701 - 12/24 0700 In: 460 [P.O.:460] Out: -  Intake/Output this shift:    General appearance: alert, fatigued and pale Resp: rhonchi bilaterally Cardio: S1, S2 normal, systolic murmur: systolic ejection 3/6, decrescendo at 2nd left intercostal space and diastolic murmur: early diastolic 2/6, decrescendo at 2nd left intercostal space GI: liver down 4 cm soft,pos bs Extremities: AVF LUA  Lab Results:  [facility] 12/29/10 0518 12/27/10 1533  WBC 9.2 7.3  HGB 10.5* 10.5*  HCT 33.3* 32.4*  PLT 172 175   BMET:  Basename 12/29/10 0518 12/27/10 1534  NA 130* 126*  K 3.2* 4.2  CL 93* 89*  CO2 26 25  GLUCOSE 126* 145*  BUN 34* 39*  CREATININE 2.96* 3.83*  CALCIUM 8.1* 8.1*   No results found for this basename: PTH:2 in the last 72 hours Iron Studies: No results found for this basename: IRON,TIBC,TRANSFERRIN,FERRITIN in the last 72 hours  Studies/Results: No results found.  I have reviewed the patient's current medications.  Assessment/Plan: 1 ESRD stabe, set up in [place] 2 DM fair control 3 AS/AI 4 Anemia stble EPO/fe 5 HPTH stable 6 Malnutition suppl  P D/C set up ouutpatient    LOS: 17 days   Thedford Bunton L 12/29/2010,9:12 AM

## 2010-12-29 NOTE — Progress Notes (Signed)
Pt is ordered to have HD tx in recliner.  Took recliner to room and was ready to transport pt to HD in recliner but she refused to go in recliner.

## 2011-01-29 ENCOUNTER — Ambulatory Visit: Payer: Medicare Other | Admitting: Internal Medicine

## 2011-02-04 ENCOUNTER — Telehealth: Payer: Self-pay | Admitting: Internal Medicine

## 2011-02-04 NOTE — Telephone Encounter (Signed)
Error

## 2011-02-06 ENCOUNTER — Ambulatory Visit: Payer: Medicare Other | Admitting: Vascular Surgery

## 2011-02-10 ENCOUNTER — Ambulatory Visit (INDEPENDENT_AMBULATORY_CARE_PROVIDER_SITE_OTHER): Payer: Medicare Other | Admitting: Internal Medicine

## 2011-02-10 ENCOUNTER — Encounter: Payer: Self-pay | Admitting: Internal Medicine

## 2011-02-10 ENCOUNTER — Telehealth: Payer: Self-pay | Admitting: Internal Medicine

## 2011-02-10 VITALS — BP 150/70 | HR 77 | Temp 97.7°F | Ht 63.0 in | Wt 95.0 lb

## 2011-02-10 DIAGNOSIS — N186 End stage renal disease: Secondary | ICD-10-CM

## 2011-02-10 DIAGNOSIS — F411 Generalized anxiety disorder: Secondary | ICD-10-CM

## 2011-02-10 DIAGNOSIS — E039 Hypothyroidism, unspecified: Secondary | ICD-10-CM

## 2011-02-10 DIAGNOSIS — I10 Essential (primary) hypertension: Secondary | ICD-10-CM

## 2011-02-10 DIAGNOSIS — E119 Type 2 diabetes mellitus without complications: Secondary | ICD-10-CM

## 2011-02-10 DIAGNOSIS — E785 Hyperlipidemia, unspecified: Secondary | ICD-10-CM

## 2011-02-10 LAB — HEMOGLOBIN A1C: Hgb A1c MFr Bld: 4.8 % (ref 4.6–6.5)

## 2011-02-10 NOTE — Telephone Encounter (Signed)
She said the order was for a nurse visit.

## 2011-02-10 NOTE — Progress Notes (Signed)
Subjective:    Patient ID: [name], female    DOB: 09-19-1920, 78 y.o.   MRN: [id number]  HPI Here with aide---stays with her Mon-Fri at different times Son with her on weekends Alone on nights at times  Had hospitalization for renal failure Then to [facility] for rehab Home since 01/30/11 Now on dialysis three days a week "I have to accept it" Seems to be going okay  Off amlodipine and clonidine BP seems to be okay  Aide tries to check sugars Machine not working right due to troubles getting enough blood No hypoglycemic reactions  No chest pain No SOB Does use oxygen during the dialysis  Has sore area on buttock Aide using protective cream  Current Outpatient Prescriptions on File Prior to Visit  Medication Sig Dispense Refill  . ALPRAZolam (XANAX) 0.5 MG tablet Take 0.25 mg by mouth at bedtime as needed. For anxiety      . Ascorbic Acid (VITAMIN C) 500 MG CHEW Chew 1 tablet by mouth daily.        Marland Kitchen aspirin 81 MG tablet Take 81 mg by mouth daily.        . calcium carbonate (OS-CAL - DOSED IN MG OF ELEMENTAL CALCIUM) 1250 MG tablet Take 1 tablet (500 mg of elemental calcium total) by mouth 3 (three) times daily with meals.  90 tablet  0  . darbepoetin (ARANESP) 100 MCG/0.5ML SOLN Inject 0.5 mLs (100 mcg total) into the vein every Tuesday with hemodialysis.  4.2 mL  0  . Diphenhyd-Hydrocort-Nystatin (FIRST-DUKES MOUTHWASH) SUSP Use as directed 5 mLs in the mouth or throat 4 (four) times daily as needed.  237 mL  1  . estradiol (ESTRACE) 0.5 MG tablet Take 0.5 mg by mouth daily.        Marland Kitchen glipiZIDE (GLUCOTROL) 5 MG tablet Take 1 tablet (5 mg total) by mouth daily before breakfast.  31 tablet  0  . levothyroxine (SYNTHROID, LEVOTHROID) 75 MCG tablet Take 37.5-75 mcg by mouth daily. Take 37.5 mg on tues and thurs, 75 mg all other days      . neomycin-polymyxin b-dexamethasone (MAXITROL) 3.5-10000-0.1 SUSP Place 2 drops into both eyes 2 (two) times daily.  1 Bottle  0  .  Nutritional Supplements (FEEDING SUPPLEMENT, NEPRO CARB STEADY,) LIQD Take 237 mLs by mouth as needed (missed meal during dialysis.).  10 Can  0  . simvastatin (ZOCOR) 20 MG tablet Take 1 tablet (20 mg total) by mouth at bedtime.  30 tablet  11  . amLODipine (NORVASC) 10 MG tablet Take 1 tablet (10 mg total) by mouth daily.  30 tablet  11    Allergies  Allergen Reactions  . Captopril     REACTION: unspecified  . Enalapril Maleate     REACTION: cough  . Nitrofurantoin     REACTION: itching  . Ramipril     REACTION: unspecified  . Sulfa Antibiotics Other (See Comments)    Reaction unknown  . Verapamil     REACTION: unspecified    Past Medical History  Diagnosis Date  . Anemia     NOS  . Personal history of colonic polyps   . Diabetes mellitus     type II  . GERD (gastroesophageal reflux disease)   . Hyperlipidemia   . Hypertension   . Hypothyroidism   . Osteoarthritis   . Osteopenia   . Urinary incontinence   . Pulmonary hypertension   . Anxiety   . Renal insufficiency   .  Coronary artery disease   . Aortic stenosis     Past Surgical History  Procedure Date  . Abdominal hysterectomy   . Tonsillectomy   . Cardiac catheterization 2000    cad  . Replacement total knee bilateral 05/1998  . Cataract extraction   . Coronary artery bypass graft     od  . Adenosine myoview 2007    benign, EF 69%  . Carotid endarterectomy 2011    Right  . Coronary artery bypass graft 2009  . Aortic valve replacement 2009  . Amputation-left great toe 7/12    Dr August Saucer  . Traumatic amputation of right dip joint of index finger   . Insertion of dialysis catheter 12/18/2010    Procedure: INSERTION OF DIALYSIS CATHETER;  Surgeon: Pryor Ochoa, MD;  Location: [facility] OR;  Service: Vascular;  Laterality: Right;  . Av fistula placement 12/22/2010    Procedure: ARTERIOVENOUS (AV) FISTULA CREATION;  Surgeon: Nilda Simmer, MD;  Location: [facility] OR;  Service: Vascular;  Laterality: Left;   Creation of Left Brachial-Cephalic Fistula    No family history on file.  History   Social History  . Marital Status: Widowed    Spouse Name: [name]    Number of Children: 1  . Years of Education: [name]   Occupational History  . 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 Smoking status: Former Smoker -- 15 years    Types: Cigarettes  . Smokeless tobacco: Never Used  . Alcohol Use: Yes     Wine occasionally  . Drug Use: No  . Sexually Active: No   Other Topics Concern  . Not on file   Social History Narrative   Retired - Engineer, petroleum, accts receivable   Review of Systems Vision is much worse---seeing eye doctor Hearing is poor also Variable appetite Weight loss seems to have stabilized    Objective:   Physical Exam  Constitutional: No distress.       Very HOH  Neck: Normal range of motion.  Cardiovascular: Normal rate and regular rhythm.   No murmur heard.      Apparent S4  Pulmonary/Chest: Effort normal and breath sounds normal. No respiratory distress. She has no wheezes. She has no rales.  Abdominal: Soft. There is no tenderness.  Musculoskeletal: She exhibits no edema.  Lymphadenopathy:    She has no cervical adenopathy.  Skin:       Very small stage 2 just below sacrum  Psychiatric: She has a normal mood and affect. Her behavior is normal.          Assessment & Plan:

## 2011-02-10 NOTE — Telephone Encounter (Signed)
Order for what??

## 2011-02-10 NOTE — Telephone Encounter (Signed)
Patient has an order for 2 x a week,but the nurse can only see her one time this week because patient has a doctor's appointment.

## 2011-02-10 NOTE — Assessment & Plan Note (Signed)
Now on dialysis Will let nephrologist manage this and anemia

## 2011-02-10 NOTE — Telephone Encounter (Signed)
Nurse visit for what?  Here for a nurse visit?

## 2011-02-10 NOTE — Assessment & Plan Note (Signed)
Unable to test at home Will check A1c

## 2011-02-10 NOTE — Assessment & Plan Note (Signed)
BP Readings from Last 3 Encounters:  02/10/11 150/70  12/29/10 164/75  12/29/10 164/75   Better since on dialysis Off a couple of meds No changes

## 2011-02-10 NOTE — Telephone Encounter (Signed)
That is fine They are not allowed to do home visit when they have a doctor visit They usually send a form for me to sign to acknowledge this

## 2011-02-10 NOTE — Assessment & Plan Note (Signed)
Will check control

## 2011-02-10 NOTE — Assessment & Plan Note (Signed)
Uses xanax mostly at bedtime

## 2011-02-11 LAB — TSH: TSH: 0.83 u[IU]/mL (ref 0.35–5.50)

## 2011-02-11 LAB — T4, FREE: Free T4: 1.15 ng/dL (ref 0.60–1.60)

## 2011-02-11 LAB — LIPID PANEL: VLDL: 23.2 mg/dL (ref 0.0–40.0)

## 2011-02-13 ENCOUNTER — Encounter: Payer: Self-pay | Admitting: *Deleted

## 2011-02-26 ENCOUNTER — Other Ambulatory Visit: Payer: Self-pay | Admitting: *Deleted

## 2011-04-02 ENCOUNTER — Ambulatory Visit: Payer: Self-pay | Admitting: Vascular Surgery

## 2011-04-19 ENCOUNTER — Other Ambulatory Visit: Payer: Self-pay | Admitting: Internal Medicine

## 2011-04-28 ENCOUNTER — Ambulatory Visit (INDEPENDENT_AMBULATORY_CARE_PROVIDER_SITE_OTHER): Payer: Medicare Other | Admitting: Internal Medicine

## 2011-04-28 ENCOUNTER — Encounter: Payer: Self-pay | Admitting: Internal Medicine

## 2011-04-28 VITALS — BP 120/60 | HR 63 | Wt 101.0 lb

## 2011-04-28 DIAGNOSIS — E119 Type 2 diabetes mellitus without complications: Secondary | ICD-10-CM

## 2011-04-28 DIAGNOSIS — I10 Essential (primary) hypertension: Secondary | ICD-10-CM

## 2011-04-28 DIAGNOSIS — L989 Disorder of the skin and subcutaneous tissue, unspecified: Secondary | ICD-10-CM | POA: Insufficient documentation

## 2011-04-28 DIAGNOSIS — N186 End stage renal disease: Secondary | ICD-10-CM

## 2011-04-28 DIAGNOSIS — I2510 Atherosclerotic heart disease of native coronary artery without angina pectoris: Secondary | ICD-10-CM

## 2011-04-28 NOTE — Progress Notes (Signed)
Subjective:    Patient ID: [name], female    DOB: May 01, 1920, 78 y.o.   MRN: [id number]  HPI Here due to concern about possible skin cancer on head Noted a lesion a few weeks ago---saw gyn and he thought it was cancer Nephrologist Dr Hyman Hopes or staff set up appt at [facility] for May 16th No bleeding or pain--slightly sore if she presses on it  Off the glipizide Sugars are checked at dialysis 130 last time Lab Results  Component Value Date   HGBA1C 4.8 02/10/2011   BP checked at dialysis Occ high and other times low Will have some dizziness immediately after dialysis Will feel steady again after a nap and some fluids  No chest pain No SOB No edema  Current Outpatient Prescriptions on File Prior to Visit  Medication Sig Dispense Refill  . ALPRAZolam (XANAX) 0.5 MG tablet Take 0.25 mg by mouth at bedtime as needed. For anxiety      . Ascorbic Acid (VITAMIN C) 500 MG CHEW Chew 1 tablet by mouth daily.        Marland Kitchen aspirin 81 MG tablet Take 81 mg by mouth daily.        Marland Kitchen b complex-vitamin c-folic acid (NEPHRO-VITE) 0.8 MG TABS Take 0.8 mg by mouth at bedtime.      . darbepoetin (ARANESP) 100 MCG/0.5ML SOLN Inject 0.5 mLs (100 mcg total) into the vein every Tuesday with hemodialysis.  4.2 mL  0  . levothyroxine (SYNTHROID, LEVOTHROID) 75 MCG tablet TAKE ONE TABLET BY MOUTH EVERY DAY  30 tablet  11  . metoprolol tartrate (LOPRESSOR) 25 MG tablet Take 75 mg by mouth 2 (two) times daily.      . mirtazapine (REMERON) 15 MG tablet Take 15 mg by mouth at bedtime.      Marland Kitchen neomycin-polymyxin b-dexamethasone (MAXITROL) 3.5-10000-0.1 SUSP Place 2 drops into both eyes 2 (two) times daily.  1 Bottle  0  . Nutritional Supplements (FEEDING SUPPLEMENT, NEPRO CARB STEADY,) LIQD Take 237 mLs by mouth as needed (missed meal during dialysis.).  10 Can  0  . simvastatin (ZOCOR) 20 MG tablet Take 1 tablet (20 mg total) by mouth at bedtime.  30 tablet  11    Allergies  Allergen Reactions  .  Captopril     REACTION: unspecified  . Enalapril Maleate     REACTION: cough  . Nitrofurantoin     REACTION: itching  . Ramipril     REACTION: unspecified  . Sulfa Antibiotics Other (See Comments)    Reaction unknown  . Verapamil     REACTION: unspecified    Past Medical History  Diagnosis Date  . Anemia     NOS  . Personal history of colonic polyps   . Diabetes mellitus     type II  . GERD (gastroesophageal reflux disease)   . Hyperlipidemia   . Hypertension   . Hypothyroidism   . Osteoarthritis   . Osteopenia   . Urinary incontinence   . Pulmonary hypertension   . Anxiety   . Renal insufficiency   . Coronary artery disease   . Aortic stenosis     Past Surgical History  Procedure Date  . Abdominal hysterectomy   . Tonsillectomy   . Cardiac catheterization 2000    cad  . Replacement total knee bilateral 05/1998  . Cataract extraction   . Coronary artery bypass graft     od  . Adenosine myoview 2007    benign, EF 69%  .  Carotid endarterectomy 2011    Right  . Coronary artery bypass graft 2009  . Aortic valve replacement 2009  . Amputation-left great toe 7/12    Dr August Saucer  . Traumatic amputation of right dip joint of index finger   . Insertion of dialysis catheter 12/18/2010    Procedure: INSERTION OF DIALYSIS CATHETER;  Surgeon: Pryor Ochoa, MD;  Location: [facility] OR;  Service: Vascular;  Laterality: Right;  . Av fistula placement 12/22/2010    Procedure: ARTERIOVENOUS (AV) FISTULA CREATION;  Surgeon: Nilda Simmer, MD;  Location: [facility] OR;  Service: Vascular;  Laterality: Left;  Creation of Left Brachial-Cephalic Fistula    No family history on file.  History   Social History  . Marital Status: Widowed    Spouse Name: [name]    Number of Children: 1  . Years of Education: [name]   Occupational History  . 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 Smoking status: Former Smoker -- 15 years    Types: Cigarettes  .  Smokeless tobacco: Never Used  . Alcohol Use: Yes     Wine occasionally  . Drug Use: No  . Sexually Active: No   Other Topics Concern  . Not on file   Social History Narrative   Retired - Engineer, petroleum, accts receivable   Review of Systems Generally sleeps okay---only occ trouble initiating Appetite is okay for breakfast---not that much after that     Objective:   Physical Exam  Constitutional: She appears well-developed. No distress.  Neck: Normal range of motion.  Cardiovascular: Normal rate.  Exam reveals no gallop.   Murmur heard.      Regular with ?occ skips Soft aortic systolic murmur  Pulmonary/Chest: Effort normal and breath sounds normal. No respiratory distress. She has no wheezes. She has no rales.  Musculoskeletal: She exhibits no edema.  Lymphadenopathy:    She has no cervical adenopathy.  Skin:       Lesion on vertex appears neoplastic but not advanced ?actinic left temporal area  Psychiatric: She has a normal mood and affect. Her behavior is normal.          Assessment & Plan:

## 2011-04-28 NOTE — Assessment & Plan Note (Signed)
BP Readings from Last 3 Encounters:  04/28/11 120/60  02/10/11 150/70  12/29/10 164/75   Good control No changes needed No sig orthostasis after dialysis

## 2011-04-28 NOTE — Assessment & Plan Note (Signed)
Appears neoplastic but not advanced Okay to wait till 5/16 with Dr Adolphus Birchwood

## 2011-04-28 NOTE — Assessment & Plan Note (Signed)
Seems to be established with the dialysis Tolerating fairly well

## 2011-04-28 NOTE — Assessment & Plan Note (Signed)
Lab Results  Component Value Date   HGBA1C 4.8 02/10/2011   Off meds now Will recheck labs next time

## 2011-04-28 NOTE — Assessment & Plan Note (Signed)
Seems to be quiet now 

## 2011-05-08 ENCOUNTER — Telehealth: Payer: Self-pay | Admitting: Internal Medicine

## 2011-05-08 NOTE — Telephone Encounter (Signed)
Patient's son called me back and said Dr.Letvak had already seen the mass at the last visit.  Patient has an appointment with a doctor in [place] on 05/21/11 to look at the mass and they may remove it on the same day.

## 2011-05-08 NOTE — Telephone Encounter (Signed)
Dr.Munoz called and said that patient has a large,scaley mass on her head.  Dr.Munoz asked if I would call to schedule an appointment for patient to see you.  I left a message on patient's answering machine and her son's voice mail to call me back and schedule appointment for Monday.

## 2011-05-09 NOTE — Telephone Encounter (Signed)
Correct  She already has appt with Dr Adolphus Birchwood

## 2011-05-12 ENCOUNTER — Ambulatory Visit: Payer: Medicare Other | Admitting: Internal Medicine

## 2011-06-15 ENCOUNTER — Other Ambulatory Visit (HOSPITAL_COMMUNITY): Payer: Self-pay | Admitting: Orthopedic Surgery

## 2011-06-17 ENCOUNTER — Encounter (HOSPITAL_COMMUNITY): Payer: Self-pay | Admitting: *Deleted

## 2011-06-17 MED ORDER — CEFAZOLIN SODIUM 1-5 GM-% IV SOLN
1.0000 g | INTRAVENOUS | Status: AC
  Administered 2011-06-18: 1 g via INTRAVENOUS
  Filled 2011-06-17: qty 50

## 2011-06-18 ENCOUNTER — Encounter (HOSPITAL_COMMUNITY): Payer: Self-pay | Admitting: *Deleted

## 2011-06-18 ENCOUNTER — Encounter (HOSPITAL_COMMUNITY): Payer: Self-pay | Admitting: Certified Registered"

## 2011-06-18 ENCOUNTER — Inpatient Hospital Stay (HOSPITAL_COMMUNITY)
Admission: RE | Admit: 2011-06-18 | Discharge: 2011-06-19 | DRG: 617 | Disposition: A | Discharge status: Skilled Nursing Facility | Payer: Medicare Other | Source: Ambulatory Visit | Attending: Orthopedic Surgery | Admitting: Orthopedic Surgery

## 2011-06-18 ENCOUNTER — Encounter (HOSPITAL_COMMUNITY)
Admission: RE | Disposition: A | Discharge status: Skilled Nursing Facility | Payer: Self-pay | Source: Ambulatory Visit | Attending: Orthopedic Surgery

## 2011-06-18 ENCOUNTER — Ambulatory Visit (HOSPITAL_COMMUNITY): Payer: Medicare Other | Admitting: Certified Registered"

## 2011-06-18 ENCOUNTER — Ambulatory Visit (HOSPITAL_COMMUNITY): Payer: Medicare Other

## 2011-06-18 DIAGNOSIS — M899 Disorder of bone, unspecified: Secondary | ICD-10-CM | POA: Diagnosis present

## 2011-06-18 DIAGNOSIS — E1159 Type 2 diabetes mellitus with other circulatory complications: Secondary | ICD-10-CM | POA: Diagnosis present

## 2011-06-18 DIAGNOSIS — M869 Osteomyelitis, unspecified: Secondary | ICD-10-CM | POA: Diagnosis present

## 2011-06-18 DIAGNOSIS — Z951 Presence of aortocoronary bypass graft: Secondary | ICD-10-CM

## 2011-06-18 DIAGNOSIS — M9080 Osteopathy in diseases classified elsewhere, unspecified site: Secondary | ICD-10-CM | POA: Diagnosis present

## 2011-06-18 DIAGNOSIS — E785 Hyperlipidemia, unspecified: Secondary | ICD-10-CM | POA: Diagnosis present

## 2011-06-18 DIAGNOSIS — E039 Hypothyroidism, unspecified: Secondary | ICD-10-CM | POA: Diagnosis present

## 2011-06-18 DIAGNOSIS — I739 Peripheral vascular disease, unspecified: Secondary | ICD-10-CM | POA: Diagnosis present

## 2011-06-18 DIAGNOSIS — Z87891 Personal history of nicotine dependence: Secondary | ICD-10-CM

## 2011-06-18 DIAGNOSIS — Z882 Allergy status to sulfonamides status: Secondary | ICD-10-CM

## 2011-06-18 DIAGNOSIS — I2510 Atherosclerotic heart disease of native coronary artery without angina pectoris: Secondary | ICD-10-CM | POA: Diagnosis present

## 2011-06-18 DIAGNOSIS — M949 Disorder of cartilage, unspecified: Secondary | ICD-10-CM | POA: Diagnosis present

## 2011-06-18 DIAGNOSIS — K219 Gastro-esophageal reflux disease without esophagitis: Secondary | ICD-10-CM | POA: Diagnosis present

## 2011-06-18 DIAGNOSIS — Z90710 Acquired absence of both cervix and uterus: Secondary | ICD-10-CM

## 2011-06-18 DIAGNOSIS — E1169 Type 2 diabetes mellitus with other specified complication: Principal | ICD-10-CM | POA: Diagnosis present

## 2011-06-18 DIAGNOSIS — L98499 Non-pressure chronic ulcer of skin of other sites with unspecified severity: Secondary | ICD-10-CM | POA: Diagnosis present

## 2011-06-18 DIAGNOSIS — I10 Essential (primary) hypertension: Secondary | ICD-10-CM | POA: Diagnosis present

## 2011-06-18 DIAGNOSIS — L97509 Non-pressure chronic ulcer of other part of unspecified foot with unspecified severity: Secondary | ICD-10-CM | POA: Diagnosis present

## 2011-06-18 HISTORY — DX: Constipation, unspecified: K59.00

## 2011-06-18 HISTORY — DX: Peripheral vascular disease, unspecified: I73.9

## 2011-06-18 HISTORY — DX: Anuria and oliguria: R34

## 2011-06-18 HISTORY — DX: Diarrhea, unspecified: R19.7

## 2011-06-18 HISTORY — DX: Migraine, unspecified, not intractable, without status migrainosus: G43.909

## 2011-06-18 HISTORY — DX: Personal history of other medical treatment: Z92.89

## 2011-06-18 HISTORY — PX: AMPUTATION: SHX166

## 2011-06-18 HISTORY — DX: Malignant (primary) neoplasm, unspecified: C80.1

## 2011-06-18 LAB — PROTIME-INR
INR: 1.04 (ref 0.00–1.49)
Prothrombin Time: 13.8 seconds (ref 11.6–15.2)

## 2011-06-18 LAB — CBC
HCT: 32.8 % — ABNORMAL LOW (ref 36.0–46.0)
Hemoglobin: 10.7 g/dL — ABNORMAL LOW (ref 12.0–15.0)
MCH: 33 pg (ref 26.0–34.0)
MCHC: 32.6 g/dL (ref 30.0–36.0)
MCV: 101.2 fL — ABNORMAL HIGH (ref 78.0–100.0)

## 2011-06-18 LAB — COMPREHENSIVE METABOLIC PANEL
Alkaline Phosphatase: 150 U/L — ABNORMAL HIGH (ref 39–117)
BUN: 30 mg/dL — ABNORMAL HIGH (ref 6–23)
Calcium: 10 mg/dL (ref 8.4–10.5)
Creatinine, Ser: 2.92 mg/dL — ABNORMAL HIGH (ref 0.50–1.10)
GFR calc Af Amer: 15 mL/min — ABNORMAL LOW (ref >90)
Glucose, Bld: 144 mg/dL — ABNORMAL HIGH (ref 70–99)
Total Protein: 8.2 g/dL (ref 6.0–8.3)

## 2011-06-18 LAB — SURGICAL PCR SCREEN
MRSA, PCR: POSITIVE — AB
Staphylococcus aureus: POSITIVE — AB

## 2011-06-18 LAB — GLUCOSE, CAPILLARY: Glucose-Capillary: 186 mg/dL — ABNORMAL HIGH (ref 70–99)

## 2011-06-18 SURGERY — AMPUTATION DIGIT
Anesthesia: Regional | Site: Foot | Laterality: Left | Wound class: Dirty or Infected

## 2011-06-18 MED ORDER — METOCLOPRAMIDE HCL 5 MG/ML IJ SOLN: 5.0000 mg | Freq: Three times a day (TID) | INTRAMUSCULAR | Status: DC | PRN

## 2011-06-18 MED ORDER — METOCLOPRAMIDE HCL 10 MG PO TABS: 5.0000 mg | ORAL_TABLET | Freq: Three times a day (TID) | ORAL | Status: DC | PRN

## 2011-06-18 MED ORDER — HYDROMORPHONE HCL PF 1 MG/ML IJ SOLN: 0.2500 mg | INTRAMUSCULAR | Status: DC | PRN

## 2011-06-18 MED ORDER — ALPRAZOLAM 0.25 MG PO TABS: 0.2500 mg | ORAL_TABLET | Freq: Every evening | ORAL | Status: DC | PRN

## 2011-06-18 MED ORDER — FENTANYL CITRATE 0.05 MG/ML IJ SOLN
INTRAMUSCULAR | Status: DC | PRN
  Administered 2011-06-18: 25 ug via INTRAVENOUS

## 2011-06-18 MED ORDER — SIMVASTATIN 20 MG PO TABS
20.0000 mg | ORAL_TABLET | Freq: Every day | ORAL | Status: DC
  Administered 2011-06-18: 20 mg via ORAL
  Filled 2011-06-18 (×2): qty 1

## 2011-06-18 MED ORDER — HYDROCODONE-ACETAMINOPHEN 5-325 MG PO TABS: 1.0000 | ORAL_TABLET | ORAL | Status: DC | PRN

## 2011-06-18 MED ORDER — NEPRO/CARBSTEADY PO LIQD
237.0000 mL | ORAL | Status: DC | PRN
  Filled 2011-06-18: qty 237

## 2011-06-18 MED ORDER — 0.9 % SODIUM CHLORIDE (POUR BTL) OPTIME
TOPICAL | Status: DC | PRN
  Administered 2011-06-18: 1000 mL

## 2011-06-18 MED ORDER — POLYVINYL ALCOHOL 1.4 % OP SOLN
1.0000 [drp] | Freq: Four times a day (QID) | OPHTHALMIC | Status: DC
  Administered 2011-06-18: 1 [drp] via OPHTHALMIC
  Filled 2011-06-18: qty 15

## 2011-06-18 MED ORDER — ASPIRIN 81 MG PO TABS: 81.0000 mg | ORAL_TABLET | Freq: Every day | ORAL | Status: DC

## 2011-06-18 MED ORDER — SODIUM CHLORIDE 0.9 % IV SOLN
INTRAVENOUS | Status: DC | PRN
  Administered 2011-06-18: 07:00:00 via INTRAVENOUS

## 2011-06-18 MED ORDER — ONDANSETRON HCL 4 MG/2ML IJ SOLN: 4.0000 mg | Freq: Once | INTRAMUSCULAR | Status: DC | PRN

## 2011-06-18 MED ORDER — OXYCODONE-ACETAMINOPHEN 5-325 MG PO TABS: 1.0000 | ORAL_TABLET | ORAL | Status: DC | PRN

## 2011-06-18 MED ORDER — HYDROMORPHONE HCL PF 1 MG/ML IJ SOLN: 0.5000 mg | INTRAMUSCULAR | Status: DC | PRN

## 2011-06-18 MED ORDER — METOPROLOL TARTRATE 50 MG PO TABS
75.0000 mg | ORAL_TABLET | Freq: Two times a day (BID) | ORAL | Status: DC
  Administered 2011-06-18 – 2011-06-19 (×2): 75 mg via ORAL
  Filled 2011-06-18 (×4): qty 1

## 2011-06-18 MED ORDER — SODIUM CHLORIDE 0.9 % IV SOLN: INTRAVENOUS | Status: DC

## 2011-06-18 MED ORDER — MIDAZOLAM HCL 5 MG/5ML IJ SOLN
INTRAMUSCULAR | Status: DC | PRN
  Administered 2011-06-18: 1 mg via INTRAVENOUS

## 2011-06-18 MED ORDER — CEFAZOLIN SODIUM 1-5 GM-% IV SOLN
1.0000 g | Freq: Four times a day (QID) | INTRAVENOUS | Status: AC
  Administered 2011-06-18 (×3): 1 g via INTRAVENOUS
  Filled 2011-06-18 (×3): qty 50

## 2011-06-18 MED ORDER — ONDANSETRON HCL 4 MG PO TABS: 4.0000 mg | ORAL_TABLET | Freq: Four times a day (QID) | ORAL | Status: DC | PRN

## 2011-06-18 MED ORDER — MUPIROCIN 2 % EX OINT
TOPICAL_OINTMENT | CUTANEOUS | Status: AC
  Filled 2011-06-18: qty 22

## 2011-06-18 MED ORDER — LEVOTHYROXINE SODIUM 75 MCG PO TABS
75.0000 ug | ORAL_TABLET | Freq: Every day | ORAL | Status: DC
  Administered 2011-06-19: 75 ug via ORAL
  Filled 2011-06-18 (×3): qty 1

## 2011-06-18 MED ORDER — ONDANSETRON HCL 4 MG/2ML IJ SOLN: 4.0000 mg | Freq: Four times a day (QID) | INTRAMUSCULAR | Status: DC | PRN

## 2011-06-18 MED ORDER — METOPROLOL TARTRATE 25 MG PO TABS
25.0000 mg | ORAL_TABLET | Freq: Once | ORAL | Status: AC
  Administered 2011-06-18: 25 mg via ORAL

## 2011-06-18 MED ORDER — ASPIRIN EC 81 MG PO TBEC
81.0000 mg | DELAYED_RELEASE_TABLET | Freq: Every day | ORAL | Status: DC
  Administered 2011-06-18 – 2011-06-19 (×2): 81 mg via ORAL
  Filled 2011-06-18 (×2): qty 1

## 2011-06-18 SURGICAL SUPPLY — 52 items
BANDAGE GAUZE 4  KLING STR (GAUZE/BANDAGES/DRESSINGS) IMPLANT
BANDAGE GAUZE ELAST BULKY 4 IN (GAUZE/BANDAGES/DRESSINGS) ×2 IMPLANT
BLADE AVERAGE 25X9 (BLADE) IMPLANT
BLADE MINI RND TIP GREEN BEAV (BLADE) IMPLANT
BNDG COHESIVE 1X5 TAN STRL LF (GAUZE/BANDAGES/DRESSINGS) IMPLANT
BNDG COHESIVE 6X5 TAN STRL LF (GAUZE/BANDAGES/DRESSINGS) ×2 IMPLANT
BNDG ESMARK 4X9 LF (GAUZE/BANDAGES/DRESSINGS) ×2 IMPLANT
BNDG GAUZE STRTCH 6 (GAUZE/BANDAGES/DRESSINGS) IMPLANT
CLOTH BEACON ORANGE TIMEOUT ST (SAFETY) ×2 IMPLANT
CORDS BIPOLAR (ELECTRODE) IMPLANT
COVER SURGICAL LIGHT HANDLE (MISCELLANEOUS) ×2 IMPLANT
CUFF TOURNIQUET SINGLE 18IN (TOURNIQUET CUFF) IMPLANT
CUFF TOURNIQUET SINGLE 24IN (TOURNIQUET CUFF) IMPLANT
CUFF TOURNIQUET SINGLE 34IN LL (TOURNIQUET CUFF) IMPLANT
CUFF TOURNIQUET SINGLE 44IN (TOURNIQUET CUFF) IMPLANT
DRAPE U-SHAPE 47X51 STRL (DRAPES) ×2 IMPLANT
DRSG ADAPTIC 3X8 NADH LF (GAUZE/BANDAGES/DRESSINGS) IMPLANT
DRSG EMULSION OIL 3X3 NADH (GAUZE/BANDAGES/DRESSINGS) ×2 IMPLANT
DRSG PAD ABDOMINAL 8X10 ST (GAUZE/BANDAGES/DRESSINGS) ×2 IMPLANT
DURAPREP 26ML APPLICATOR (WOUND CARE) ×2 IMPLANT
ELECT REM PT RETURN 9FT ADLT (ELECTROSURGICAL) ×2
ELECTRODE REM PT RTRN 9FT ADLT (ELECTROSURGICAL) ×1 IMPLANT
GAUZE SPONGE 2X2 8PLY STRL LF (GAUZE/BANDAGES/DRESSINGS) IMPLANT
GLOVE BIOGEL PI IND STRL 7.5 (GLOVE) ×2 IMPLANT
GLOVE BIOGEL PI IND STRL 9 (GLOVE) ×1 IMPLANT
GLOVE BIOGEL PI INDICATOR 7.5 (GLOVE) ×2
GLOVE BIOGEL PI INDICATOR 9 (GLOVE) ×1
GLOVE SURG ORTHO 9.0 STRL STRW (GLOVE) ×2 IMPLANT
GLOVE SURG SS PI 7.5 STRL IVOR (GLOVE) ×6 IMPLANT
GOWN PREVENTION PLUS XLARGE (GOWN DISPOSABLE) ×2 IMPLANT
GOWN SRG XL XLNG 56XLVL 4 (GOWN DISPOSABLE) ×3 IMPLANT
GOWN STRL NON-REIN XL XLG LVL4 (GOWN DISPOSABLE) ×3
KIT BASIN OR (CUSTOM PROCEDURE TRAY) ×2 IMPLANT
KIT ROOM TURNOVER OR (KITS) ×2 IMPLANT
MANIFOLD NEPTUNE II (INSTRUMENTS) ×2 IMPLANT
NEEDLE HYPO 25GX1X1/2 BEV (NEEDLE) IMPLANT
NS IRRIG 1000ML POUR BTL (IV SOLUTION) ×2 IMPLANT
PACK ORTHO EXTREMITY (CUSTOM PROCEDURE TRAY) ×2 IMPLANT
PAD ARMBOARD 7.5X6 YLW CONV (MISCELLANEOUS) ×2 IMPLANT
PAD CAST 4YDX4 CTTN HI CHSV (CAST SUPPLIES) IMPLANT
PADDING CAST COTTON 4X4 STRL (CAST SUPPLIES)
SPECIMEN JAR SMALL (MISCELLANEOUS) ×2 IMPLANT
SPONGE GAUZE 2X2 STER 10/PKG (GAUZE/BANDAGES/DRESSINGS)
SPONGE GAUZE 4X4 12PLY (GAUZE/BANDAGES/DRESSINGS) ×2 IMPLANT
SUCTION FRAZIER TIP 10 FR DISP (SUCTIONS) IMPLANT
SUT ETHILON 2 0 PSLX (SUTURE) ×2 IMPLANT
SUT VIC AB 2-0 FS1 27 (SUTURE) IMPLANT
SYR CONTROL 10ML LL (SYRINGE) IMPLANT
TOWEL OR 17X24 6PK STRL BLUE (TOWEL DISPOSABLE) ×2 IMPLANT
TOWEL OR 17X26 10 PK STRL BLUE (TOWEL DISPOSABLE) ×2 IMPLANT
TUBE CONNECTING 12X1/4 (SUCTIONS) IMPLANT
WATER STERILE IRR 1000ML POUR (IV SOLUTION) IMPLANT

## 2011-06-18 NOTE — Op Note (Signed)
OPERATIVE REPORT  DATE OF SURGERY: 06/18/2011  PATIENT:  [name],  78 y.o. female  PRE-OPERATIVE DIAGNOSIS:  Osteomyelitis Left 2nd Toe  POST-OPERATIVE DIAGNOSIS:  Osteomyelitis Left 2nd Toe  PROCEDURE:  Procedure(s): AMPUTATION DIGIT  SURGEON:  Surgeon(s): Nadara Mustard, MD  ANESTHESIA:   regional  EBL:  min ML  SPECIMEN:  Source of Specimen:  Left foot second toe  TOURNIQUET:  * No tourniquets in log *  PROCEDURE DETAILS: Patient is a 78 year old woman status post a first ray amputation who has developed osteomyelitis of the left foot second toe with a Wagner grade 3 ulcer. She has failed conservative treatment and presents at this time for amputation of the digit. Risks and benefits were discussed including nonhealing of the wound persistent infection. Patient states he understands was pursued this time. Should see your patient was brought to or room for after undergoing an ankle block. After adequate levels of anesthesia were obtained patient's left lower extremity was prepped using DuraPrep draped into a sterile field. The left second toe was amputated through the MTP joint. Wound was irrigated with normal saline hemostasis was obtained the toe was sent to pathology the incision was closed using 2-0 nylon. The wound was covered with Adaptic orthopedic sponges AB dressing Kerlix and Coban. Patient was taken to PACU in stable condition plan for overnight observation.  PLAN OF CARE: Admit to inpatient   PATIENT DISPOSITION:  PACU - hemodynamically stable.   Nadara Mustard, MD 06/18/2011 8:09 AM

## 2011-06-18 NOTE — Progress Notes (Signed)
Orthopedic Tech Progress Note Patient Details:  [name] 03-14-1920 [id number]  Ortho Devices Type of Ortho Device: Postop boot Ortho Device/Splint Location: left foot Ortho Device/Splint Interventions: Application   Makenze Ellett T 06/18/2011, 8:54 AM

## 2011-06-18 NOTE — Progress Notes (Signed)
Physical Therapy Evaluation Note  Past Medical History  Diagnosis Date  . Anemia     NOS  . Personal history of colonic polyps   . GERD (gastroesophageal reflux disease)   . Hyperlipidemia     takes Simvastatin nightly  . Osteoarthritis   . Osteopenia   . Urinary incontinence   . Pulmonary hypertension   . Anxiety   . Renal insufficiency   . Coronary artery disease   . Aortic stenosis   . Cancer     Mole on top of head to be removed in July 2013  . Hyperlipidemia   . Hypertension     takes Metoprolol daily  . Peripheral vascular disease   . CHF (congestive heart failure)   . Migraine     hx of  . Constipation   . Diarrhea   . Oligouria   . History of blood transfusion   . Diabetes mellitus     type II;was on Glipizide but has been off x 6mon  . Hypothyroidism     takes Synthroid daily    Past Surgical History  Procedure Date  . Abdominal hysterectomy   . Tonsillectomy   . Cardiac catheterization 2000    cad  . Replacement total knee bilateral 05/1998  . Cataract extraction   . Coronary artery bypass graft     od  . Adenosine myoview 2007    benign, EF 69%  . Carotid endarterectomy 2011    Right  . Coronary artery bypass graft 2009  . Aortic valve replacement 2009  . Amputation-left great toe 7/12    Dr August Saucer  . Traumatic amputation of right dip joint of index finger   . Insertion of dialysis catheter 12/18/2010    Procedure: INSERTION OF DIALYSIS CATHETER;  Surgeon: Pryor Ochoa, MD;  Location: [facility] OR;  Service: Vascular;  Laterality: Right;  . Av fistula placement 12/22/2010    Procedure: ARTERIOVENOUS (AV) FISTULA CREATION;  Surgeon: Nilda Simmer, MD;  Location: [facility] OR;  Service: Vascular;  Laterality: Left;  Creation of Left Brachial-Cephalic Fistula  . Appendectomy   . Eye surgery     BIlateral     06/18/11 1418  PT Visit Information  Last PT Received On 06/18/11  Assistance Needed +2 (for chair follow and impulsivity)  PT Time  Calculation  PT Start Time 1418  PT Stop Time 1438  PT Time Calculation (min) 20 min  Subjective Data  Subjective Pt received supine in bed, denies pain.  Precautions  Precautions Fall  Precaution Comments pt impulsive   Required Braces or Orthoses (L post op shoe)  Restrictions  Weight Bearing Restrictions Yes  LLE Weight Bearing TWB  Home Living  Lives With Alone  Available Help at Discharge (none - pt reports son to be available but questionable)  Type of Home House  Home Access Stairs to enter  Entrance Stairs-Number of Steps 3-4  Entrance Stairs-Rails Right  Home Layout One level (has basement)  Bathroom Nurse, children's Yes  How Accessible Accessible via walker  Home Adaptive Equipment Bedside commode/3-in-1;Walker - standard;Walker - four wheeled;Walker - rolling;Crutches;Straight cane  Prior Function  Level of Independence Independent  Able to Take Stairs? Yes  Driving Yes  Vocation Retired  Geneticist, molecular No difficulties  Cognition  Overall Cognitive Status Appears within functional limits for tasks assessed/performed  Arousal/Alertness Awake/alert  Orientation Level Oriented X4 / Intact  Behavior During Session [facility] for tasks performed  Right Upper Extremity Assessment  RUE ROM/Strength/Tone WFL  Left Upper Extremity Assessment  LUE ROM/Strength/Tone WFL  Right Lower Extremity Assessment  RLE ROM/Strength/Tone WFL  Left Lower Extremity Assessment  LLE ROM/Strength/Tone WFL  Trunk Assessment  Trunk Assessment Normal  Bed Mobility  Bed Mobility Supine to Sit  Supine to Sit 5: Supervision;HOB flat  Details for Bed Mobility Assistance pt with good technique  Transfers  Transfers Sit to Stand;Stand to Sit  Sit to Stand 4: Min guard;From bed  Stand to Sit 4: Min guard;To chair/3-in-1  Stand Pivot Transfers 3: Mod assist (with RW)  Details for Transfer Assistance v/c's for safety,  hand placement and L LE management. pt impulsive and non-compliant with L LE TWB.  Ambulation/Gait  Ambulation/Gait Assistance Not tested (comment) (pt non-compliant with with L LE TWB)  Ambulation Distance (Feet) (5 steps to chair)  PT - End of Session  Equipment Utilized During Treatment Gait belt  Activity Tolerance Patient tolerated treatment well  Patient left in chair;with call bell/phone within reach  Nurse Communication Mobility status  PT Assessment  Clinical Impression Statement Pt s/p L 1st toe amputation and L LE TWB. Patient lives alone with questionable assist avail upon d/c. patient impulsive, decreased safety awareness, and non-compliant with L LE TWB. patient at increased fall risk and inability to "hop" on R LE. Pt with questionable assist upon d/c.   PT Recommendation/Assessment Patient needs continued PT services  PT Problem List Decreased activity tolerance;Decreased balance;Decreased mobility  Barriers to Discharge Decreased caregiver support  Barriers to Discharge Comments questionable assist at d/c and ability to access home  PT Therapy Diagnosis  Generalized weakness;Difficulty walking  PT Plan  PT Frequency Min 5X/week  PT Treatment/Interventions Gait training;Therapeutic activities;Therapeutic exercise;Balance training;Stair training;Functional mobility training  PT Recommendation  Follow Up Recommendations Home health PT;Supervision/Assistance - 24 hour (possible SNF if patient not safe tomorrow with amb/stair negotiation  Equipment Recommended None recommended by PT  Individuals Consulted  Consulted and Agree with Results and Recommendations Patient  Acute Rehab PT Goals  PT Goal Formulation With patient  Time For Goal Achievement 06/25/11  Potential to Achieve Goals Good  Pt will go Supine/Side to Sit Independently;with HOB 0 degrees  PT Goal: Supine/Side to Sit - Progress Goal set today  Pt will go Sit to Stand Independently;with upper extremity assist    PT Goal: Sit to Stand - Progress Goal set today  Pt will Ambulate 51 - 150 feet;with supervision;with rolling walker (L LE TWB.)  PT Goal: Ambulate - Progress Goal set today  Pt will Go Up / Down Stairs 3-5 stairs;with min assist;with rail(s)  PT Goal: Up/Down Stairs - Progress Goal set today  Additional Goals  Additional Goal #1 Pt 100% compliant with L LE TWB.  PT Goal: Additional Goal #1 - Progress Goal set today  PT General Charges  $$ ACUTE PT VISIT 1 Procedure  PT Evaluation  $Initial PT Evaluation Tier II 1 Procedure  Written Expression  Dominant Hand Right     Pain: pt did not rate L foot pain  Lewis Shock, PT, DPT Pager #: [phone] Office #: [phone]

## 2011-06-18 NOTE — Transfer of Care (Signed)
Immediate Anesthesia Transfer of Care Note  Patient: [name]  Procedure(s) Performed: Procedure(s) (LRB): AMPUTATION DIGIT (Left)  Patient Location: PACU  Anesthesia Type: MAC  Level of Consciousness: patient cooperative and responds to stimulation  Airway & Oxygen Therapy: Patient Spontanous Breathing and Patient connected to nasal cannula oxygen  Post-op Assessment: Report given to PACU RN and Post -op Vital signs reviewed and stable  Post vital signs: Reviewed and stable  Complications: No apparent anesthesia complications

## 2011-06-18 NOTE — H&P (Signed)
[name] is an 78 y.o. female.   Chief Complaint: Osteomyelitis and abscess left foot second toe HPI: Patient is an 78 year old woman with diabetes peripheral vascular disease with chronic myelogenous left foot second toe she has failed conservative treatment and presents at this time for left second toe amputation.  Past Medical History  Diagnosis Date  . Anemia     NOS  . Personal history of colonic polyps   . GERD (gastroesophageal reflux disease)   . Hyperlipidemia     takes Simvastatin nightly  . Osteoarthritis   . Osteopenia   . Urinary incontinence   . Pulmonary hypertension   . Anxiety   . Renal insufficiency   . Coronary artery disease   . Aortic stenosis   . Cancer     Mole on top of head to be removed in July 2013  . Hyperlipidemia   . Hypertension     takes Metoprolol daily  . Peripheral vascular disease   . CHF (congestive heart failure)   . Migraine     hx of  . Constipation   . Diarrhea   . Oligouria   . History of blood transfusion   . Diabetes mellitus     type II;was on Glipizide but has been off x 6mon  . Hypothyroidism     takes Synthroid daily    Past Surgical History  Procedure Date  . Abdominal hysterectomy   . Tonsillectomy   . Cardiac catheterization 2000    cad  . Replacement total knee bilateral 05/1998  . Cataract extraction   . Coronary artery bypass graft     od  . Adenosine myoview 2007    benign, EF 69%  . Carotid endarterectomy 2011    Right  . Coronary artery bypass graft 2009  . Aortic valve replacement 2009  . Amputation-left great toe 7/12    Dr August Saucer  . Traumatic amputation of right dip joint of index finger   . Insertion of dialysis catheter 12/18/2010    Procedure: INSERTION OF DIALYSIS CATHETER;  Surgeon: Pryor Ochoa, MD;  Location: [facility] OR;  Service: Vascular;  Laterality: Right;  . Av fistula placement 12/22/2010    Procedure: ARTERIOVENOUS (AV) FISTULA CREATION;  Surgeon: Nilda Simmer, MD;  Location:  [facility] OR;  Service: Vascular;  Laterality: Left;  Creation of Left Brachial-Cephalic Fistula  . Appendectomy   . Eye surgery     BIlateral    History reviewed. No pertinent family history. Social History:  reports that she quit smoking about 68 years ago. Her smoking use included Cigarettes. She quit after 15 years of use. She has never used smokeless tobacco. She reports that she drinks alcohol. She reports that she does not use illicit drugs.  Allergies:  Allergies  Allergen Reactions  . Captopril     REACTION: unspecified  . Enalapril Maleate     REACTION: cough  . Nitrofurantoin     REACTION: itching  . Ramipril     REACTION: unspecified  . Sulfa Antibiotics Other (See Comments)    Reaction unknown  . Verapamil     REACTION: unspecified    Medications Prior to Admission  Medication Sig Dispense Refill  . ALPRAZolam (XANAX) 0.5 MG tablet Take 0.25 mg by mouth at bedtime as needed. For anxiety      . Ascorbic Acid (VITAMIN C) 500 MG CHEW Chew 1 tablet by mouth daily.        Marland Kitchen aspirin 81 MG tablet Take 81  mg by mouth daily.        Marland Kitchen b complex-vitamin c-folic acid (NEPHRO-VITE) 0.8 MG TABS Take 0.8 mg by mouth at bedtime.      Marland Kitchen levothyroxine (SYNTHROID, LEVOTHROID) 75 MCG tablet Take 75 mcg by mouth daily.      . metoprolol tartrate (LOPRESSOR) 25 MG tablet Take 75 mg by mouth 2 (two) times daily.      . Nutritional Supplements (FEEDING SUPPLEMENT, NEPRO CARB STEADY,) LIQD Take 237 mLs by mouth as needed (missed meal during dialysis.).  10 Can  0  . Polyethyl Glycol-Propyl Glycol (SYSTANE) 0.4-0.3 % SOLN Place 1 drop into the right eye 4 (four) times daily.      . simvastatin (ZOCOR) 20 MG tablet Take 1 tablet (20 mg total) by mouth at bedtime.  30 tablet  11    Results for orders placed during the hospital encounter of 06/18/11 (from the past 48 hour(s))  APTT     Status: Normal   Collection Time   06/18/11  6:39 AM      Component Value Range Comment   aPTT 27  24 - 37  seconds   CBC     Status: Abnormal   Collection Time   06/18/11  6:39 AM      Component Value Range Comment   WBC 9.0  4.0 - 10.5 K/uL    RBC 3.24 (*) 3.87 - 5.11 MIL/uL    Hemoglobin 10.7 (*) 12.0 - 15.0 g/dL    HCT 16.1 (*) 09.6 - 46.0 %    MCV 101.2 (*) 78.0 - 100.0 fL    MCH 33.0  26.0 - 34.0 pg    MCHC 32.6  30.0 - 36.0 g/dL    RDW 04.5  40.9 - 81.1 %    Platelets 239  150 - 400 K/uL   COMPREHENSIVE METABOLIC PANEL     Status: Abnormal   Collection Time   06/18/11  6:39 AM      Component Value Range Comment   Sodium 140  135 - 145 mEq/L    Potassium 3.9  3.5 - 5.1 mEq/L    Chloride 97  96 - 112 mEq/L    CO2 29  19 - 32 mEq/L    Glucose, Bld 144 (*) 70 - 99 mg/dL    BUN 30 (*) 6 - 23 mg/dL    Creatinine, Ser 9.14 (*) 0.50 - 1.10 mg/dL    Calcium 78.2  8.4 - 10.5 mg/dL    Total Protein 8.2  6.0 - 8.3 g/dL    Albumin 3.3 (*) 3.5 - 5.2 g/dL    AST 17  0 - 37 U/L    ALT 14  0 - 35 U/L    Alkaline Phosphatase 150 (*) 39 - 117 U/L    Total Bilirubin 0.3  0.3 - 1.2 mg/dL    GFR calc non Af Amer 13 (*) >90 mL/min    GFR calc Af Amer 15 (*) >90 mL/min   PROTIME-INR     Status: Normal   Collection Time   06/18/11  6:39 AM      Component Value Range Comment   Prothrombin Time 13.8  11.6 - 15.2 seconds    INR 1.04  0.00 - 1.49   GLUCOSE, CAPILLARY     Status: Abnormal   Collection Time   06/18/11  6:44 AM      Component Value Range Comment   Glucose-Capillary 116 (*) 70 - 99 mg/dL    Dg  Chest 2 View  06/18/2011  *RADIOLOGY REPORT*  Clinical Data: Abnormal chest x-ray 12/12/2010.  Amputation second toe left foot.  CHEST - 2 VIEW  Comparison: None.  Findings: Cardiomegaly and CABG.  Aortic arch atherosclerosis. Bilateral pleural apical scarring and calcification.  Resolution of previously seen airspace disease and effusion. Removal of the right IJ dialysis catheter.  No airspace disease or effusion is present.  Bioprosthetic aortic valve. Emphysema is present.  IMPRESSION:  Cardiomegaly and postoperative changes of the chest without active cardiopulmonary disease.  Original Report Authenticated By: Andreas Newport, M.D.    Review of Systems  All other systems reviewed and are negative.    Blood pressure 197/65, pulse 75, temperature 97.7 F (36.5 C), temperature source Oral, resp. rate 16, height 5\' 3"  (1.6 m), weight 47.174 kg (104 lb), SpO2 98.00%. Physical Exam  On examination patient does have palpable pulses she is cellulitis swelling ulceration osteomyelitis of the left foot second toe. Assessment/Plan Assessment: Osteomyelitis and infection left foot second toe.  Plan: We'll plan for amputation of the left foot second toe. Risks and benefits were discussed including nonhealing of the wound need for higher level amputation. Patient states he understands was pursued this time.  Neely Kammerer V 06/18/2011, 7:36 AM

## 2011-06-18 NOTE — Progress Notes (Signed)
UR COMPLETED  

## 2011-06-18 NOTE — Progress Notes (Signed)
Dialysis on M/W/F in [place]

## 2011-06-18 NOTE — Anesthesia Procedure Notes (Signed)
Procedure Name: MAC Date/Time: 06/18/2011 7:45 AM Performed by: Sheppard Evens Pre-anesthesia Checklist: Patient identified, Emergency Drugs available, Suction available, Patient being monitored and Timeout performed Patient Re-evaluated:Patient Re-evaluated prior to inductionOxygen Delivery Method: Nasal cannula

## 2011-06-18 NOTE — Anesthesia Preprocedure Evaluation (Addendum)
Anesthesia Evaluation  Patient identified by MRN, date of birth, ID band Patient awake    Reviewed: Allergy & Precautions, H&P , NPO status , Patient's Chart, lab work & pertinent test results, reviewed documented beta blocker date and time   History of Anesthesia Complications Negative for: history of anesthetic complications  Airway Mallampati: I      Dental  (+) Partial Upper   Pulmonary  breath sounds clear to auscultation        Cardiovascular hypertension, Pt. on medications and Pt. on home beta blockers + angina + CAD and +CHF Rhythm:Regular Rate:Normal     Neuro/Psych  Headaches,    GI/Hepatic GERD-  ,  Endo/Other  Diabetes mellitus-Hypothyroidism   Renal/GU ESRF and DialysisRenal disease     Musculoskeletal   Abdominal   Peds  Hematology   Anesthesia Other Findings   Reproductive/Obstetrics                          Anesthesia Physical Anesthesia Plan  ASA: III  Anesthesia Plan: MAC and Regional   Post-op Pain Management:    Induction:   Airway Management Planned:   Additional Equipment:   Intra-op Plan:   Post-operative Plan:   Informed Consent: I have reviewed the patients History and Physical, chart, labs and discussed the procedure including the risks, benefits and alternatives for the proposed anesthesia with the patient or authorized representative who has indicated his/her understanding and acceptance.   Dental advisory given  Plan Discussed with: Anesthesiologist and Surgeon  Anesthesia Plan Comments: (Gangrene L. 2and Toe Type 2 DM ESRD last HD 06/17/11  Plan Ankle block with MAC  Kipp Brood, MD)       Anesthesia Quick Evaluation

## 2011-06-18 NOTE — Progress Notes (Addendum)
Multiple echos in epic   Heart cath in epic from 2009  Medical Md is Dr.Letvak  Stress test many,many yrs ago

## 2011-06-18 NOTE — Anesthesia Postprocedure Evaluation (Signed)
  Anesthesia Post-op Note  Patient: [name]  Procedure(s) Performed: Procedure(s) (LRB): AMPUTATION DIGIT (Left)  Patient Location: PACU  Anesthesia Type: MAC and MAC combined with regional for post-op pain  Level of Consciousness: awake, alert  and oriented  Airway and Oxygen Therapy: Patient Spontanous Breathing and Patient connected to nasal cannula oxygen  Post-op Pain: none  Post-op Assessment: Post-op Vital signs reviewed and Patient's Cardiovascular Status Stable  Post-op Vital Signs: stable  Complications: No apparent anesthesia complications

## 2011-06-19 ENCOUNTER — Encounter (HOSPITAL_COMMUNITY): Payer: Self-pay | Admitting: Orthopedic Surgery

## 2011-06-19 MED FILL — Mupirocin Oint 2%: CUTANEOUS | Qty: 22 | Status: AC

## 2011-06-19 NOTE — Progress Notes (Signed)
Physical Therapy Treatment Note   06/19/11 0821  PT Visit Information  Last PT Received On 06/19/11  Assistance Needed +1  PT Time Calculation  PT Start Time 0821  PT Stop Time 0842  PT Time Calculation (min) 21 min  Subjective Data  Subjective Patient received supine in bed with no recollection of working yesterday with PT.  Patient Stated Goal to go home.  Precautions  Precautions Fall  Restrictions  Weight Bearing Restrictions Yes  LLE Weight Bearing TWB - with L post op shoe on  Cognition  Overall Cognitive Status Appears within functional limits for tasks assessed/performed  Arousal/Alertness Awake/alert  Orientation Level Oriented X4 / Intact  Behavior During Session [facility] for tasks performed  Bed Mobility  Bed Mobility Supine to Sit  Supine to Sit 6: Modified independent (Device/Increase time);HOB flat  Details for Bed Mobility Assistance pt with good technique  Transfers  Transfers Sit to Stand;Stand to Sit  Sit to Stand 4: Min guard;From bed  Stand to Sit 4: Min guard;To chair/3-in-1  Details for Transfer Assistance v/c's to maintain L LE TWB,   Ambulation/Gait  Ambulation/Gait Assistance 4: Min guard  Ambulation Distance (Feet) 5 Feet  Assistive device Rolling walker  Ambulation/Gait Assistance Details pt with minimal R foot clearance when attempting to hop on R LE. Pt requested to use crutches however patient with difficulty with walker and crutches would increase instability and fall risk  Gait Pattern Step-to pattern  Gait velocity slow  Stairs (will educated son when he arrives)  PT - End of Session  Equipment Utilized During Treatment Gait belt  Activity Tolerance Patient tolerated treatment well  Patient left in chair;with call bell/phone within reach  Nurse Communication Mobility status  PT - Assessment/Plan  Comments on Treatment Session Pt remains impulsive with poor short term memory requiring 24/7 assist at home to be compliant with R LE TWB and to  complete ADLs and all mobiltiy safely. Patient to be unsafe with use of crutches and highly encouraged use of RW.  PT Plan Discharge plan remains appropriate;Frequency remains appropriate  PT Frequency Min 5X/week  Follow Up Recommendations Home health PT;Supervision/Assistance - 24 hour  Equipment Recommended None recommended by PT  Acute Rehab PT Goals  Time For Goal Achievement 06/25/11  Potential to Achieve Goals Good  PT Goal: Supine/Side to Sit - Progress Progressing toward goal  PT Goal: Sit to Stand - Progress Progressing toward goal  PT Goal: Ambulate - Progress Progressing toward goal  Additional Goals  PT Goal: Additional Goal #1 - Progress Progressing toward goal  PT General Charges  $$ ACUTE PT VISIT 1 Procedure  PT Treatments  $Gait Training 8-22 mins     Pain: pt denies L foot pain  Lewis Shock, PT, DPT Pager #: [phone] Office #: [phone]

## 2011-06-19 NOTE — Discharge Summary (Signed)
Physician Discharge Summary  Patient ID: [name] MRN: [id number] DOB/AGE: 06-26-1920 78 y.o.  Admit date: 06/18/2011 Discharge date: 06/19/2011  Admission Diagnoses: Osteomyelitis left foot second toe  Discharge Diagnoses: Same Active Problems:  * No active hospital problems. *    Discharged Condition: stable  Hospital Course: Patient's hospital course was essentially unremarkable. She underwent amputation of the second toe left foot with Wagner grade 3 ulcer and osteomyelitis. Postoperatively patient had no pain her dressing was clean and dry she was discharged to home in stable condition.  Consults: None  Significant Diagnostic Studies: labs: Routine labs  Treatments: surgery: Please see operative note  Discharge Exam: Blood pressure 193/72, pulse 77, temperature 98.1 F (36.7 C), temperature source Oral, resp. rate 18, height 5\' 3"  (1.6 m), weight 47.174 kg (104 lb), SpO2 98.00%. Incision/Wound: incision clean and dry  Disposition: 03-Skilled Nursing Facility  Discharge Orders    Future Appointments: Provider: Department: Dept Phone: Center:   08/04/2011 10:15 AM [name], MD [facility] [phone] [id number]     Future Orders Please Complete By Expires   Diet - low sodium heart healthy      Call MD / Call 911      Comments:   If you experience chest pain or shortness of breath, CALL 911 and be transported to the hospital emergency room.  If you develope a fever above 101 F, pus (white drainage) or increased drainage or redness at the wound, or calf pain, call your surgeon's office.   Constipation Prevention      Comments:   Drink plenty of fluids.  Prune juice may be helpful.  You may use a stool softener, such as Colace (over the counter) 100 mg twice a day.  Use MiraLax (over the counter) for constipation as needed.   Increase activity slowly as tolerated        Medication List  As of 06/19/2011  6:50 AM   TAKE these medications         ALPRAZolam  0.5 MG tablet   Commonly known as: XANAX   Take 0.25 mg by mouth at bedtime as needed. For anxiety      aspirin 81 MG tablet   Take 81 mg by mouth daily.      b complex-vitamin c-folic acid 0.8 MG Tabs   Take 0.8 mg by mouth at bedtime.      feeding supplement (NEPRO CARB STEADY) Liqd   Take 237 mLs by mouth as needed (missed meal during dialysis.).      levothyroxine 75 MCG tablet   Commonly known as: SYNTHROID, LEVOTHROID   Take 75 mcg by mouth daily.      metoprolol tartrate 25 MG tablet   Commonly known as: LOPRESSOR   Take 75 mg by mouth 2 (two) times daily.      simvastatin 20 MG tablet   Commonly known as: ZOCOR   Take 1 tablet (20 mg total) by mouth at bedtime.      SYSTANE 0.4-0.3 % Soln   Generic drug: Polyethyl Glycol-Propyl Glycol   Place 1 drop into the right eye 4 (four) times daily.      Vitamin C 500 MG Chew   Chew 1 tablet by mouth daily.           Follow-up Information    Follow up with Zainah Steven V, MD in 2 weeks.   Contact information:   [address] [place] [address] [phone]  Signed: Izael Bessinger V 06/19/2011, 6:50 AM

## 2011-06-19 NOTE — Care Management Note (Addendum)
  Page 2 of 2   06/19/2011     11:06:26 AM   CARE MANAGEMENT NOTE 06/19/2011  Patient:  [name], [name]   Account Number:  [id number]  Date Initiated:  06/19/2011  Documentation initiated by:  Anette Guarneri  Subjective/Objective Assessment:   POD#1 s/p amp 2nd toe/left foot  Lives alone with assistance in am and pm, goes to HD MWF     Action/Plan:   needs HHPT   Anticipated DC Date:  06/19/2011   Anticipated DC Plan:  HOME W HOME HEALTH SERVICES      DC Planning Services  CM consult      Choice offered to / List presented to:  C-1 Patient        HH arranged  HH-2 PT      [facility] agency  [facility]   Status of service:  Completed, signed off Medicare Important Message given?   (If response is "NO", the following Medicare IM given date fields will be blank) Date Medicare IM given:   Date Additional Medicare IM given:    Discharge Disposition:  HOME W HOME HEALTH SERVICES  Per UR Regulation:  Reviewed for med. necessity/level of care/duration of stay  If discussed at Long Length of Stay Meetings, dates discussed:    Comments:  06/19/11  10:59  Anette Guarneri RN/CM spoke with patient and son/Charlie Leonor Liv regarding d/c needs PT recommends HHPT, per son patient has DME, also has assistance in am and pm Paitent has used [facility] in past for [facility] services and wishes to use [facility] after d/c. Contacted [facility], arranged HHPT  HOME HEALTH AGENCIES SERVING [facility]   Agencies that are Medicare-Certified and are affiliated with [facility]  Telephone Number Address  [facility]   The [facility] has ownership interest in this company; however, you are under no obligation to use this agency. [phone] or  [phone] [place], [place] [address]   Agencies that are Medicare-Certified and are not affiliated with [facility] Telephone Number Address  [facility] [phone] Fax [phone] [address], [address] [place], [place]  [address]  [facility] [phone] or [phone] Fax [phone] [address] [place], [place] [address]  [facility] [phone] Fax [phone] [address] [place], [place] [address]  [facility] [phone] Fax [phone] [address] [place], [place]  [address]  [facility] [phone] Fax [phone] [address], Suite [place], [place] [address]  [facility] [phone] [address] [place], [place] [address]  [facility] [phone]  [address] Suite [place], [place] [address]  [facility] [phone] or [phone] Fax [phone] [address] [place], [place]  [address]  [facility] [phone] Fax [phone] [address] [place], [place]  [address]  [facility]  [phone] Fax [phone] [address] [place], [place] [address]

## 2011-06-19 NOTE — Discharge Instructions (Signed)
Minimize weightbearing left lower extremity keep dressing clean and dry until followup. Wear postoperative shoe for ambulation. Use crutches for ambulation.

## 2011-06-19 NOTE — Progress Notes (Signed)
Physical Therapy Treatment Note   06/19/11 1035  PT Visit Information  Last PT Received On 06/19/11  Assistance Needed +1  PT Time Calculation  PT Start Time 1035  PT Stop Time 1047  PT Time Calculation (min) 12 min  Subjective Data  Subjective PT focus on educating son on how to assist patient on the stair to access home and patient's precautions.  Restrictions  LLE Weight Bearing TWB  Other Position/Activity Restrictions per Dr. Lajoyce Corners pt able to put weight on L LE for stair negotiation for safety but otherwise TWB.  Cognition  Overall Cognitive Status Appears within functional limits for tasks assessed/performed  Arousal/Alertness Awake/alert  Orientation Level Oriented X4 / Intact  Behavior During Session [facility] for tasks performed  Transfers  Transfers Sit to Stand;Stand to Sit  Sit to Stand 4: Min guard;From chair/3-in-1;With armrests  Stand to Sit 4: Min guard;With upper extremity assist;To chair/3-in-1  Ambulation/Gait  Ambulation/Gait Assistance 4: Min guard  Ambulation Distance (Feet) 5 Feet  Assistive device Rolling walker  Stairs Yes  Stairs Assistance 3: Mod assist (via HHA)  Stairs Assistance Details (indicate cue type and reason) max vc's for appropriate sequencing "up with the good (right) leg, down with the bad (left) leg." Patient spouse educated first and then returned demonstrated with good technique with patient.  Stair Management Technique One rail Left (R HHA)  Number of Stairs 2   PT - End of Session  Equipment Utilized During Treatment Gait belt  Activity Tolerance Patient tolerated treatment well  Patient left in chair;with call bell/phone within reach  PT - Assessment/Plan  Comments on Treatment Session Pt remains impulsive however patient son with good understanding and able to instructed patient accurately. patient son aware of L LE WBing precautions and how to assist patient on stairs. Son aware of patients impulsivity and is able to correct patient to  complete safe transfers/mobility.  PT Plan Discharge plan remains appropriate;Frequency remains appropriate  PT Frequency Min 5X/week  Follow Up Recommendations Home health PT;Supervision/Assistance - 24 hour  Equipment Recommended None recommended by PT  Acute Rehab PT Goals  Time For Goal Achievement 06/25/11  Potential to Achieve Goals Good  PT Goal: Ambulate - Progress Progressing toward goal  PT Goal: Up/Down Stairs - Progress Progressing toward goal  PT General Charges  $$ ACUTE PT VISIT 1 Procedure  PT Treatments  $Gait Training 8-22 mins     Lewis Shock, PT, DPT Pager #: [phone] Office #: [phone]

## 2011-08-04 ENCOUNTER — Ambulatory Visit (INDEPENDENT_AMBULATORY_CARE_PROVIDER_SITE_OTHER): Payer: Medicare Other | Admitting: Internal Medicine

## 2011-08-04 ENCOUNTER — Encounter: Payer: Self-pay | Admitting: Internal Medicine

## 2011-08-04 VITALS — BP 138/60 | HR 68 | Temp 97.8°F | Ht 63.0 in | Wt 102.5 lb

## 2011-08-04 DIAGNOSIS — E119 Type 2 diabetes mellitus without complications: Secondary | ICD-10-CM

## 2011-08-04 DIAGNOSIS — I2510 Atherosclerotic heart disease of native coronary artery without angina pectoris: Secondary | ICD-10-CM

## 2011-08-04 DIAGNOSIS — I10 Essential (primary) hypertension: Secondary | ICD-10-CM

## 2011-08-04 DIAGNOSIS — E785 Hyperlipidemia, unspecified: Secondary | ICD-10-CM

## 2011-08-04 DIAGNOSIS — N186 End stage renal disease: Secondary | ICD-10-CM

## 2011-08-04 DIAGNOSIS — E039 Hypothyroidism, unspecified: Secondary | ICD-10-CM

## 2011-08-04 LAB — HEMOGLOBIN A1C: Hgb A1c MFr Bld: 6.4 % (ref 4.6–6.5)

## 2011-08-04 NOTE — Assessment & Plan Note (Signed)
Lab Results  Component Value Date   LDLCALC 98 02/10/2011   At goal on meds

## 2011-08-04 NOTE — Assessment & Plan Note (Signed)
Off meds Will check A1c

## 2011-08-04 NOTE — Assessment & Plan Note (Signed)
BP Readings from Last 3 Encounters:  08/04/11 138/60  06/19/11 193/72  06/19/11 193/72   Good control now No changes in metoprolol

## 2011-08-04 NOTE — Assessment & Plan Note (Signed)
No active symptoms On statin, asa, beta blocker

## 2011-08-04 NOTE — Progress Notes (Signed)
Subjective:    Patient ID: [name], female    DOB: 30-Jun-1920, 78 y.o.   MRN: [id number]  HPI Was hospitalized last month Had amputation of infected left toe Did well with this---Dr Lajoyce Corners is happy with her healing Back home Only has caregiver to help her before and after dialysis  Nephrologists were talking about blood transfusion Then next one said no Hgb 9.3  Sugars are up off the medication Has had 2 sugars done during dialysis--- over 200 in past 2 months She doesn't check at home  Had 2 falls recently Tripped on stones on driveway Needed help getting up Next time she was dizzy in house--fell on floor but able to get up Feels she is in "too much of a rush" at times  Has regular follow up for knee arthritis Has ongoing pain in left hand--may have been related to fall Right hip pain at times  Doing well with dialysis  Current Outpatient Prescriptions on File Prior to Visit  Medication Sig Dispense Refill  . ALPRAZolam (XANAX) 0.5 MG tablet Take 0.25 mg by mouth at bedtime as needed. For anxiety      . Ascorbic Acid (VITAMIN C) 500 MG CHEW Chew 1 tablet by mouth daily.        Marland Kitchen aspirin 81 MG tablet Take 81 mg by mouth daily.        Marland Kitchen b complex-vitamin c-folic acid (NEPHRO-VITE) 0.8 MG TABS Take 0.8 mg by mouth at bedtime.      Marland Kitchen levothyroxine (SYNTHROID, LEVOTHROID) 75 MCG tablet Take 75 mcg by mouth daily.      . metoprolol tartrate (LOPRESSOR) 25 MG tablet Take 75 mg by mouth 2 (two) times daily.      . Nutritional Supplements (FEEDING SUPPLEMENT, NEPRO CARB STEADY,) LIQD Take 237 mLs by mouth as needed (missed meal during dialysis.).  10 Can  0  . Polyethyl Glycol-Propyl Glycol (SYSTANE) 0.4-0.3 % SOLN Place 1 drop into the right eye 4 (four) times daily.      . simvastatin (ZOCOR) 20 MG tablet Take 1 tablet (20 mg total) by mouth at bedtime.  30 tablet  11    Allergies  Allergen Reactions  . Captopril     REACTION: unspecified  . Enalapril Maleate    REACTION: cough  . Nitrofurantoin     REACTION: itching  . Ramipril     REACTION: unspecified  . Sulfa Antibiotics Other (See Comments)    Reaction unknown  . Verapamil     REACTION: unspecified    Past Medical History  Diagnosis Date  . Anemia     NOS  . Personal history of colonic polyps   . GERD (gastroesophageal reflux disease)   . Hyperlipidemia     takes Simvastatin nightly  . Osteoarthritis   . Osteopenia   . Urinary incontinence   . Pulmonary hypertension   . Anxiety   . Renal insufficiency   . Coronary artery disease   . Aortic stenosis   . Cancer     Mole on top of head to be removed in July 2013  . Hyperlipidemia   . Hypertension     takes Metoprolol daily  . Peripheral vascular disease   . CHF (congestive heart failure)   . Migraine     hx of  . Constipation   . Diarrhea   . Oligouria   . History of blood transfusion   . Diabetes mellitus     type II;was on Glipizide but has  been off x 6mon  . Hypothyroidism     takes Synthroid daily    Past Surgical History  Procedure Date  . Abdominal hysterectomy   . Tonsillectomy   . Cardiac catheterization 2000    cad  . Replacement total knee bilateral 05/1998  . Cataract extraction   . Coronary artery bypass graft     od  . Adenosine myoview 2007    benign, EF 69%  . Carotid endarterectomy 2011    Right  . Coronary artery bypass graft 2009  . Aortic valve replacement 2009  . Amputation-left great toe 7/12    Dr August Saucer  . Traumatic amputation of right dip joint of index finger   . Insertion of dialysis catheter 12/18/2010    Procedure: INSERTION OF DIALYSIS CATHETER;  Surgeon: Pryor Ochoa, MD;  Location: [facility] OR;  Service: Vascular;  Laterality: Right;  . Av fistula placement 12/22/2010    Procedure: ARTERIOVENOUS (AV) FISTULA CREATION;  Surgeon: Nilda Simmer, MD;  Location: [facility] OR;  Service: Vascular;  Laterality: Left;  Creation of Left Brachial-Cephalic Fistula  . Appendectomy   . Eye  surgery     BIlateral  . Amputation 06/18/2011    Procedure: AMPUTATION DIGIT;  Surgeon: Nadara Mustard, MD;  Location: [facility] OR;  Service: Orthopedics;  Laterality: Left;  Left 2nd Toe Amputation at MTP Joint    No family history on file.  History   Social History  . Marital Status: Widowed    Spouse Name: [name]    Number of Children: 1  . Years of Education: [name]   Occupational History  . 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 Smoking status: Former Smoker -- 15 years    Types: Cigarettes    Quit date: 01/06/1943  . Smokeless tobacco: Never Used  . Alcohol Use: Yes     Wine occasionally  . Drug Use: No  . Sexually Active: No   Other Topics Concern  . Not on file   Social History Narrative   Retired - Engineer, petroleum, accts receivable   Review of Systems Occ soreness in RUQ Able to mow yard [employer] is variable Weight fairly stable     Objective:   Physical Exam  Constitutional: She appears well-developed. No distress.  Neck: Normal range of motion. Neck supple. No thyromegaly present.  Cardiovascular: Normal rate and regular rhythm.  Exam reveals no gallop.        Faint aortic systolic murmur Faint pulse on right but no palpable on left  Pulmonary/Chest: Effort normal and breath sounds normal. No respiratory distress. She has no wheezes. She has no rales.  Abdominal: Soft.       Slight epigastric tenderness  Musculoskeletal: She exhibits no edema and no tenderness.       Left foot looks great---well healed from 1st and 2nd toe amputations  Lymphadenopathy:    She has no cervical adenopathy.  Psychiatric: She has a normal mood and affect. Her behavior is normal.          Assessment & Plan:

## 2011-08-04 NOTE — Assessment & Plan Note (Signed)
Doing well with dialysis 

## 2011-08-04 NOTE — Assessment & Plan Note (Signed)
Lab Results  Component Value Date   TSH 0.83 02/10/2011   Labs next time

## 2011-08-14 ENCOUNTER — Other Ambulatory Visit: Payer: Self-pay | Admitting: Cardiology

## 2011-08-21 ENCOUNTER — Other Ambulatory Visit (HOSPITAL_COMMUNITY): Payer: Self-pay | Admitting: Nephrology

## 2011-08-21 DIAGNOSIS — N186 End stage renal disease: Secondary | ICD-10-CM

## 2011-08-25 ENCOUNTER — Ambulatory Visit (HOSPITAL_COMMUNITY): Payer: Medicare Other

## 2011-09-24 ENCOUNTER — Ambulatory Visit (INDEPENDENT_AMBULATORY_CARE_PROVIDER_SITE_OTHER): Payer: Medicare Other

## 2011-09-24 DIAGNOSIS — Z23 Encounter for immunization: Secondary | ICD-10-CM

## 2011-09-26 ENCOUNTER — Other Ambulatory Visit: Payer: Self-pay | Admitting: Cardiology

## 2011-10-15 ENCOUNTER — Telehealth: Payer: Self-pay | Admitting: Cardiology

## 2011-10-15 NOTE — Telephone Encounter (Signed)
I spoke with pt and she would like to restart exercise at the [facility]. She has not been seen since 08/2010. No cardiac issues at present. Still taking dialysis on MWF She only wanted to see Dr. Daleen Squibb.  Appt scheduled for 11/13/11 Mylo Red RN

## 2011-10-15 NOTE — Telephone Encounter (Signed)
Pt wants to restart her exercise class like cardiac rehab but she needs an order sent in

## 2011-11-02 NOTE — Telephone Encounter (Signed)
She can start before seeing me. Just obtain paperwork.

## 2011-11-05 NOTE — Telephone Encounter (Signed)
Paperwork signed & faxed. Mylo Red RN

## 2011-11-12 ENCOUNTER — Encounter: Payer: Self-pay | Admitting: Cardiology

## 2011-11-12 ENCOUNTER — Ambulatory Visit (INDEPENDENT_AMBULATORY_CARE_PROVIDER_SITE_OTHER): Payer: Medicare Other | Admitting: Cardiology

## 2011-11-12 VITALS — BP 162/60 | HR 62 | Ht 63.0 in | Wt 108.0 lb

## 2011-11-12 DIAGNOSIS — Z954 Presence of other heart-valve replacement: Secondary | ICD-10-CM

## 2011-11-12 DIAGNOSIS — Z952 Presence of prosthetic heart valve: Secondary | ICD-10-CM

## 2011-11-12 DIAGNOSIS — E785 Hyperlipidemia, unspecified: Secondary | ICD-10-CM

## 2011-11-12 DIAGNOSIS — I359 Nonrheumatic aortic valve disorder, unspecified: Secondary | ICD-10-CM

## 2011-11-12 DIAGNOSIS — I2510 Atherosclerotic heart disease of native coronary artery without angina pectoris: Secondary | ICD-10-CM

## 2011-11-12 DIAGNOSIS — I10 Essential (primary) hypertension: Secondary | ICD-10-CM

## 2011-11-12 DIAGNOSIS — I6529 Occlusion and stenosis of unspecified carotid artery: Secondary | ICD-10-CM

## 2011-11-12 DIAGNOSIS — I771 Stricture of artery: Secondary | ICD-10-CM

## 2011-11-12 DIAGNOSIS — N186 End stage renal disease: Secondary | ICD-10-CM

## 2011-11-12 NOTE — Assessment & Plan Note (Signed)
Status post aortic valve replacement doing remarkably well. Stable by exam and history. I have cleared her to participate in cardiac rehabilitation. No indication for echocardiography.

## 2011-11-12 NOTE — Patient Instructions (Signed)
Your physician recommends that you continue on your current medications as directed. Please refer to the Current Medication list given to you today.  Your physician has requested that you have a carotid duplex. This test is an ultrasound of the carotid arteries in your neck. It looks at blood flow through these arteries that supply the brain with blood. Allow one hour for this exam. There are no restrictions or special instructions.  Your physician wants you to follow-up in: 1 year with Dr. Daleen Squibb. You will receive a reminder letter in the mail two months in advance. If you don't receive a letter, please call our office to schedule the follow-up appointment.

## 2011-11-12 NOTE — Assessment & Plan Note (Signed)
Stable. Continue secondary preventative therapy. 

## 2011-11-12 NOTE — Progress Notes (Signed)
HPI [name] returns today for evaluation and management of her history of coronary artery disease, history of aortic stenosis status post aortic valve replacement, and carotid artery disease.  She now has end-stage renal disease is on dialysis. She doesn't like it but her attitude is good. She tolerates it very well hemodynamically. She's very active and continues to drive. She also wants to get back into cardiac rehabilitation at [facility]. Paperwork done.  Past Medical History  Diagnosis Date  . Anemia     NOS  . Personal history of colonic polyps   . GERD (gastroesophageal reflux disease)   . Hyperlipidemia     takes Simvastatin nightly  . Osteoarthritis   . Osteopenia   . Urinary incontinence   . Pulmonary hypertension   . Anxiety   . Renal insufficiency   . Coronary artery disease   . Aortic stenosis   . Cancer     Mole on top of head to be removed in July 2013  . Hyperlipidemia   . Hypertension     takes Metoprolol daily  . Peripheral vascular disease   . CHF (congestive heart failure)   . Migraine     hx of  . Constipation   . Diarrhea   . Oligouria   . History of blood transfusion   . Diabetes mellitus     type II;was on Glipizide but has been off x 6mon  . Hypothyroidism     takes Synthroid daily    Current Outpatient Prescriptions  Medication Sig Dispense Refill  . ALPRAZolam (XANAX) 0.5 MG tablet Take 0.25 mg by mouth at bedtime as needed. For anxiety      . Ascorbic Acid (VITAMIN C) 500 MG CHEW Chew 1 tablet by mouth daily.        Marland Kitchen aspirin 81 MG tablet Take 81 mg by mouth daily.        Marland Kitchen b complex-vitamin c-folic acid (NEPHRO-VITE) 0.8 MG TABS Take 0.8 mg by mouth at bedtime.      Marland Kitchen levothyroxine (SYNTHROID, LEVOTHROID) 75 MCG tablet Take 75 mcg by mouth daily.      . metoprolol (LOPRESSOR) 50 MG tablet TAKE ONE & ONE-HALF TABLETS BY MOUTH TWICE DAILY  90 tablet  5  . Nutritional Supplements (FEEDING SUPPLEMENT, NEPRO CARB STEADY,) LIQD Take 237 mLs by  mouth as needed (missed meal during dialysis.).  10 Can  0  . Polyethyl Glycol-Propyl Glycol (SYSTANE) 0.4-0.3 % SOLN Place 1 drop into the right eye 4 (four) times daily.      . simvastatin (ZOCOR) 20 MG tablet TAKE ONE TABLET BY MOUTH AT BEDTIME  30 tablet  5  . [DISCONTINUED] metoprolol tartrate (LOPRESSOR) 25 MG tablet Take 75 mg by mouth 2 (two) times daily.        Allergies  Allergen Reactions  . Captopril     REACTION: unspecified  . Enalapril Maleate     REACTION: cough  . Nitrofurantoin     REACTION: itching  . Ramipril     REACTION: unspecified  . Sulfa Antibiotics Other (See Comments)    Reaction unknown  . Verapamil     REACTION: unspecified    No family history on file.  History   Social History  . Marital Status: Widowed    Spouse Name: [name]    Number of Children: 1  . Years of Education: [name]   Occupational History  . 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  Smoking status: Former Smoker -- 15 years    Types: Cigarettes    Quit date: 01/06/1943  . Smokeless tobacco: Never Used  . Alcohol Use: Yes     Comment: Wine occasionally  . Drug Use: No  . Sexually Active: No   Other Topics Concern  . Not on file   Social History Narrative   Retired - Engineer, petroleum, accts receivable    ROS ALL NEGATIVE EXCEPT THOSE NOTED IN HPI  PE  General Appearance: well developed, well nourished in no acute distress, looks younger than stated age HEENT: symmetrical face, PERRLA, good dentition  Neck: no JVD, thyromegaly, or adenopathy, trachea midline Chest: symmetric without deformity Cardiac: PMI non-displaced, RRR, normal S1, S2, no gallop, aortic stenosis murmur S2 splits no diastolic component Lung: clear to ausculation and percussion Vascular: Pulses diminished lower chimney's. Bilateral carotid bruits   Abdominal: nondistended, nontender, good bowel sounds, no HSM, no bruits Extremities: no cyanosis, clubbing or edema, no sign  of DVT, no varicosities  Skin: normal color, no rashes Neuro: alert and oriented x 3, non-focal Pysch: normal affect  EKG Not repeated  BMET    Component Value Date/Time   NA 140 06/18/2011 0639   K 3.9 06/18/2011 0639   CL 97 06/18/2011 0639   CO2 29 06/18/2011 0639   GLUCOSE 144* 06/18/2011 0639   GLUCOSE 144* 01/13/2006 0000   BUN 30* 06/18/2011 0639   CREATININE 2.92* 06/18/2011 0639   CALCIUM 10.0 06/18/2011 0639   GFRNONAA 13* 06/18/2011 0639   GFRAA 15* 06/18/2011 0639    Lipid Panel     Component Value Date/Time   CHOL 178 02/10/2011 1307   TRIG 116.0 02/10/2011 1307   HDL 56.60 02/10/2011 1307   CHOLHDL 3 02/10/2011 1307   VLDL 23.2 02/10/2011 1307   LDLCALC 98 02/10/2011 1307    CBC    Component Value Date/Time   WBC 9.0 06/18/2011 0639   RBC 3.24* 06/18/2011 0639   HGB 10.7* 06/18/2011 0639   HCT 32.8* 06/18/2011 0639   PLT 239 06/18/2011 0639   MCV 101.2* 06/18/2011 0639   MCH 33.0 06/18/2011 0639   MCHC 32.6 06/18/2011 0639   RDW 13.9 06/18/2011 0639   LYMPHSABS 0.2* 12/12/2010 1533   MONOABS 0.1 12/12/2010 1533   EOSABS 0.0 12/12/2010 1533   BASOSABS 0.0 12/12/2010 1533

## 2011-11-12 NOTE — Assessment & Plan Note (Signed)
Schedule surveillance carotids. She is remarkably active and we would aggressively treat her with stenting.

## 2011-11-18 ENCOUNTER — Other Ambulatory Visit: Payer: Self-pay | Admitting: Nephrology

## 2011-11-18 LAB — POTASSIUM: Potassium: 4.7 mmol/L (ref 3.5–5.1)

## 2011-12-01 ENCOUNTER — Other Ambulatory Visit: Payer: Self-pay | Admitting: Cardiology

## 2011-12-01 DIAGNOSIS — R0989 Other specified symptoms and signs involving the circulatory and respiratory systems: Secondary | ICD-10-CM

## 2011-12-08 ENCOUNTER — Encounter: Payer: Self-pay | Admitting: Internal Medicine

## 2011-12-08 ENCOUNTER — Ambulatory Visit (INDEPENDENT_AMBULATORY_CARE_PROVIDER_SITE_OTHER): Payer: Medicare Other | Admitting: Internal Medicine

## 2011-12-08 VITALS — BP 140/58 | HR 76 | Temp 98.2°F | Wt 108.0 lb

## 2011-12-08 DIAGNOSIS — I10 Essential (primary) hypertension: Secondary | ICD-10-CM

## 2011-12-08 DIAGNOSIS — N058 Unspecified nephritic syndrome with other morphologic changes: Secondary | ICD-10-CM

## 2011-12-08 DIAGNOSIS — E039 Hypothyroidism, unspecified: Secondary | ICD-10-CM

## 2011-12-08 DIAGNOSIS — E785 Hyperlipidemia, unspecified: Secondary | ICD-10-CM

## 2011-12-08 DIAGNOSIS — N186 End stage renal disease: Secondary | ICD-10-CM

## 2011-12-08 DIAGNOSIS — E1129 Type 2 diabetes mellitus with other diabetic kidney complication: Secondary | ICD-10-CM

## 2011-12-08 LAB — HEPATIC FUNCTION PANEL
ALT: 14 U/L (ref 0–35)
Albumin: 4.1 g/dL (ref 3.5–5.2)
Total Bilirubin: 0.8 mg/dL (ref 0.3–1.2)

## 2011-12-08 LAB — LIPID PANEL
HDL: 59 mg/dL (ref >39.00)
Total CHOL/HDL Ratio: 3
VLDL: 26.2 mg/dL (ref 0.0–40.0)

## 2011-12-08 LAB — TSH: TSH: 0.55 u[IU]/mL (ref 0.35–5.50)

## 2011-12-08 LAB — HEMOGLOBIN A1C: Hgb A1c MFr Bld: 5.6 % (ref 4.6–6.5)

## 2011-12-08 LAB — T4, FREE: Free T4: 1.06 ng/dL (ref 0.60–1.60)

## 2011-12-08 NOTE — Assessment & Plan Note (Signed)
Seems to have good control without meds Will check labs

## 2011-12-08 NOTE — Assessment & Plan Note (Signed)
BP Readings from Last 3 Encounters:  12/08/11 140/58  11/12/11 162/60  08/04/11 138/60   Good control No changes

## 2011-12-08 NOTE — Assessment & Plan Note (Signed)
Doing well with the dialysis One hypotensive spell but generally tolerates well

## 2011-12-08 NOTE — Progress Notes (Signed)
Subjective:    Patient ID: [name], female    DOB: Jan 20, 1920, 78 y.o.   MRN: [id number]  HPI Doing well Recent reports from dialysis are good except still some anemia  No chest pain No SOB--except when BP dropped at dialysis Is due for repeat carotid dopplers Did get some hypotension at dialysis---some dizziness No syncope  Sugars checked at dialysis They have been okay  Has had some soreness in left breast Stays tender regularly May be related to dialysis  Anxiety okay Uses the alprazolam only very occasionally  Current Outpatient Prescriptions on File Prior to Visit  Medication Sig Dispense Refill  . ALPRAZolam (XANAX) 0.5 MG tablet Take 0.25 mg by mouth at bedtime as needed. For anxiety      . Ascorbic Acid (VITAMIN C) 500 MG CHEW Chew 1 tablet by mouth daily.        Marland Kitchen aspirin 81 MG tablet Take 81 mg by mouth daily.        Marland Kitchen b complex-vitamin c-folic acid (NEPHRO-VITE) 0.8 MG TABS Take 0.8 mg by mouth at bedtime.      Marland Kitchen levothyroxine (SYNTHROID, LEVOTHROID) 75 MCG tablet Take 75 mcg by mouth daily.      . metoprolol (LOPRESSOR) 50 MG tablet TAKE ONE & ONE-HALF TABLETS BY MOUTH TWICE DAILY  90 tablet  5  . Nutritional Supplements (FEEDING SUPPLEMENT, NEPRO CARB STEADY,) LIQD Take 237 mLs by mouth as needed (missed meal during dialysis.).  10 Can  0  . Polyethyl Glycol-Propyl Glycol (SYSTANE) 0.4-0.3 % SOLN Place 1 drop into the right eye 4 (four) times daily.      . simvastatin (ZOCOR) 20 MG tablet TAKE ONE TABLET BY MOUTH AT BEDTIME  30 tablet  5    Allergies  Allergen Reactions  . Captopril     REACTION: unspecified  . Enalapril Maleate     REACTION: cough  . Nitrofurantoin     REACTION: itching  . Ramipril     REACTION: unspecified  . Sulfa Antibiotics Other (See Comments)    Reaction unknown  . Verapamil     REACTION: unspecified    Past Medical History  Diagnosis Date  . Anemia     NOS  . Personal history of colonic polyps   . GERD  (gastroesophageal reflux disease)   . Hyperlipidemia     takes Simvastatin nightly  . Osteoarthritis   . Osteopenia   . Urinary incontinence   . Pulmonary hypertension   . Anxiety   . Renal insufficiency   . Coronary artery disease   . Aortic stenosis   . Cancer     Mole on top of head to be removed in July 2013  . Hyperlipidemia   . Hypertension     takes Metoprolol daily  . Peripheral vascular disease   . CHF (congestive heart failure)   . Migraine     hx of  . Constipation   . Diarrhea   . Oligouria   . History of blood transfusion   . Diabetes mellitus     type II;was on Glipizide but has been off x 6mon  . Hypothyroidism     takes Synthroid daily    Past Surgical History  Procedure Date  . Abdominal hysterectomy   . Tonsillectomy   . Cardiac catheterization 2000    cad  . Replacement total knee bilateral 05/1998  . Cataract extraction   . Coronary artery bypass graft     od  . Adenosine myoview  2007    benign, EF 69%  . Carotid endarterectomy 2011    Right  . Coronary artery bypass graft 2009  . Aortic valve replacement 2009  . Amputation-left great toe 7/12    Dr August Saucer  . Traumatic amputation of right dip joint of index finger   . Insertion of dialysis catheter 12/18/2010    Procedure: INSERTION OF DIALYSIS CATHETER;  Surgeon: Pryor Ochoa, MD;  Location: [facility] OR;  Service: Vascular;  Laterality: Right;  . Av fistula placement 12/22/2010    Procedure: ARTERIOVENOUS (AV) FISTULA CREATION;  Surgeon: Nilda Simmer, MD;  Location: [facility] OR;  Service: Vascular;  Laterality: Left;  Creation of Left Brachial-Cephalic Fistula  . Appendectomy   . Eye surgery     BIlateral  . Amputation 06/18/2011    Procedure: AMPUTATION DIGIT;  Surgeon: Nadara Mustard, MD;  Location: [facility] OR;  Service: Orthopedics;  Laterality: Left;  Left 2nd Toe Amputation at MTP Joint    No family history on file.  History   Social History  . Marital Status: Widowed    Spouse Name: [name]     Number of Children: 1  . Years of Education: [name]   Occupational History  . 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 Smoking status: Former Smoker -- 15 years    Types: Cigarettes    Quit date: 01/06/1943  . Smokeless tobacco: Never Used  . Alcohol Use: Yes     Comment: Wine occasionally  . Drug Use: No  . Sexually Active: No   Other Topics Concern  . Not on file   Social History Narrative   Retired - Engineer, petroleum, accts receivable   Review of Systems Sleeps okay generally  Weight is stable     Objective:   Physical Exam  Constitutional: She appears well-developed. No distress.  Neck: Normal range of motion. Neck supple.  Cardiovascular: Normal rate and regular rhythm.  Exam reveals no gallop.   Murmur heard.      Gr 2/6 systolic murmur at base Valve click No apparent diastolic murmur  Pulmonary/Chest: Effort normal and breath sounds normal. No respiratory distress. She has no wheezes. She has no rales.  Genitourinary:       No breast masses or tenderness  Lymphadenopathy:    She has no cervical adenopathy.  Psychiatric: She has a normal mood and affect. Her behavior is normal.          Assessment & Plan:

## 2011-12-08 NOTE — Addendum Note (Signed)
Addended by: Tillman Abide I on: 12/08/2011 11:18 AM   Modules accepted: Orders

## 2011-12-08 NOTE — Assessment & Plan Note (Signed)
Less likely to be helpful now that she is on dialysis but will continue

## 2011-12-10 ENCOUNTER — Encounter (INDEPENDENT_AMBULATORY_CARE_PROVIDER_SITE_OTHER): Payer: Medicare Other

## 2011-12-10 DIAGNOSIS — I6529 Occlusion and stenosis of unspecified carotid artery: Secondary | ICD-10-CM

## 2011-12-10 DIAGNOSIS — R0989 Other specified symptoms and signs involving the circulatory and respiratory systems: Secondary | ICD-10-CM

## 2011-12-16 ENCOUNTER — Encounter: Payer: Self-pay | Admitting: *Deleted

## 2011-12-18 ENCOUNTER — Other Ambulatory Visit (HOSPITAL_COMMUNITY): Payer: Self-pay | Admitting: Nephrology

## 2011-12-18 DIAGNOSIS — N186 End stage renal disease: Secondary | ICD-10-CM

## 2011-12-21 ENCOUNTER — Telehealth: Payer: Self-pay | Admitting: Cardiology

## 2011-12-21 NOTE — Telephone Encounter (Signed)
PT WOULD LIKE RESULTS OF HER TEST [name] CALLED HER BUT SHE HAD NOT GOTTEN A CALL BACK

## 2011-12-21 NOTE — Telephone Encounter (Signed)
Spoke with pt and reviewed carotid doppler results with her.  

## 2011-12-24 ENCOUNTER — Ambulatory Visit (HOSPITAL_COMMUNITY): Admission: RE | Admit: 2011-12-24 | Payer: Medicare Other | Source: Ambulatory Visit

## 2011-12-28 ENCOUNTER — Ambulatory Visit (HOSPITAL_COMMUNITY)
Admission: RE | Admit: 2011-12-28 | Discharge: 2011-12-28 | Disposition: A | Discharge status: Home / Self Care | Payer: Medicare Other | Source: Ambulatory Visit | Attending: Nephrology | Admitting: Nephrology

## 2011-12-28 DIAGNOSIS — N186 End stage renal disease: Secondary | ICD-10-CM | POA: Insufficient documentation

## 2011-12-28 DIAGNOSIS — Y841 Kidney dialysis as the cause of abnormal reaction of the patient, or of later complication, without mention of misadventure at the time of the procedure: Secondary | ICD-10-CM | POA: Insufficient documentation

## 2011-12-28 DIAGNOSIS — T82898A Other specified complication of vascular prosthetic devices, implants and grafts, initial encounter: Secondary | ICD-10-CM | POA: Insufficient documentation

## 2011-12-28 MED ORDER — IOHEXOL 300 MG/ML  SOLN
100.0000 mL | Freq: Once | INTRAMUSCULAR | Status: AC | PRN
  Administered 2011-12-28: 24 mL via INTRAVENOUS

## 2011-12-29 ENCOUNTER — Telehealth (HOSPITAL_COMMUNITY): Payer: Self-pay | Admitting: *Deleted

## 2011-12-29 NOTE — Telephone Encounter (Signed)
Post procedure follow up call.  No answer, VM left requesting return call with any questions or concerns. 

## 2012-03-15 ENCOUNTER — Other Ambulatory Visit: Payer: Self-pay | Admitting: Cardiology

## 2012-03-19 ENCOUNTER — Other Ambulatory Visit: Payer: Self-pay | Admitting: Cardiology

## 2012-04-21 ENCOUNTER — Other Ambulatory Visit: Payer: Self-pay | Admitting: Internal Medicine

## 2012-06-01 ENCOUNTER — Other Ambulatory Visit: Payer: Self-pay | Admitting: Nephrology

## 2012-06-07 ENCOUNTER — Ambulatory Visit (INDEPENDENT_AMBULATORY_CARE_PROVIDER_SITE_OTHER): Payer: Medicare Other | Admitting: Internal Medicine

## 2012-06-07 ENCOUNTER — Encounter: Payer: Self-pay | Admitting: Internal Medicine

## 2012-06-07 VITALS — BP 150/70 | HR 70 | Wt 113.0 lb

## 2012-06-07 DIAGNOSIS — E039 Hypothyroidism, unspecified: Secondary | ICD-10-CM

## 2012-06-07 DIAGNOSIS — I10 Essential (primary) hypertension: Secondary | ICD-10-CM

## 2012-06-07 DIAGNOSIS — E1129 Type 2 diabetes mellitus with other diabetic kidney complication: Secondary | ICD-10-CM

## 2012-06-07 DIAGNOSIS — F411 Generalized anxiety disorder: Secondary | ICD-10-CM

## 2012-06-07 DIAGNOSIS — E785 Hyperlipidemia, unspecified: Secondary | ICD-10-CM

## 2012-06-07 DIAGNOSIS — Z1331 Encounter for screening for depression: Secondary | ICD-10-CM

## 2012-06-07 LAB — CBC WITH DIFFERENTIAL/PLATELET
Basophils Relative: 0.4 % (ref 0.0–3.0)
Eosinophils Relative: 2.7 % (ref 0.0–5.0)
HCT: 33.3 % — ABNORMAL LOW (ref 36.0–46.0)
Hemoglobin: 11 g/dL — ABNORMAL LOW (ref 12.0–15.0)
Lymphs Abs: 1.2 10*3/uL (ref 0.7–4.0)
MCV: 103.6 fl — ABNORMAL HIGH (ref 78.0–100.0)
Monocytes Absolute: 0.9 10*3/uL (ref 0.1–1.0)
RBC: 3.22 Mil/uL — ABNORMAL LOW (ref 3.87–5.11)
WBC: 9.4 10*3/uL (ref 4.5–10.5)

## 2012-06-07 MED ORDER — ALPRAZOLAM 0.5 MG PO TABS: 0.2500 mg | ORAL_TABLET | Freq: Every evening | ORAL | Status: DC | PRN

## 2012-06-07 NOTE — Assessment & Plan Note (Signed)
Uses xanax rarely.  

## 2012-06-07 NOTE — Assessment & Plan Note (Signed)
Seems euthyroid ?Will check labs ?

## 2012-06-07 NOTE — Progress Notes (Signed)
Subjective:    Patient ID: [name], female    DOB: 1920/10/10, 79 y.o.   MRN: [id number]  HPI Doing well  Tolerating the dialysis well Does have some orthostasis at times They bring her out in wheelchair and she takes the Zenaida Niece (now free!)  Keeps up with the orthopedist Shoulders, hips and knees are okay Uses acetaminophen at times  No chest pain No SOB--does get oxygen during dialysis if hypotensive  Sugars are checked at dialysis Runs okay No hypoglycemic reactions  Has occasional anxiety Will use alprazolam once or twice a month on average  Current Outpatient Prescriptions on File Prior to Visit  Medication Sig Dispense Refill  . ALPRAZolam (XANAX) 0.5 MG tablet Take 0.25 mg by mouth at bedtime as needed. For anxiety      . Ascorbic Acid (VITAMIN C) 500 MG CHEW Chew 1 tablet by mouth daily.        Marland Kitchen aspirin 81 MG tablet Take 81 mg by mouth daily.        Marland Kitchen b complex-vitamin c-folic acid (NEPHRO-VITE) 0.8 MG TABS Take 0.8 mg by mouth at bedtime.      Marland Kitchen levothyroxine (SYNTHROID, LEVOTHROID) 75 MCG tablet TAKE ONE TABLET BY MOUTH EVERY DAY  30 tablet  11  . metoprolol (LOPRESSOR) 50 MG tablet Take 1.5 tablets (75 mg total) by mouth 2 (two) times daily.  90 tablet  11  . Nutritional Supplements (FEEDING SUPPLEMENT, NEPRO CARB STEADY,) LIQD Take 237 mLs by mouth as needed (missed meal during dialysis.).  10 Can  0  . Polyethyl Glycol-Propyl Glycol (SYSTANE) 0.4-0.3 % SOLN Place 1 drop into the right eye 4 (four) times daily.      . simvastatin (ZOCOR) 20 MG tablet TAKE ONE TABLET BY MOUTH AT BEDTIME  30 tablet  5   No current facility-administered medications on file prior to visit.    Allergies  Allergen Reactions  . Captopril     REACTION: unspecified  . Enalapril Maleate     REACTION: cough  . Nitrofurantoin     REACTION: itching  . Ramipril     REACTION: unspecified  . Sulfa Antibiotics Other (See Comments)    Reaction unknown  . Verapamil     REACTION:  unspecified    Past Medical History  Diagnosis Date  . Anemia     NOS  . Personal history of colonic polyps   . GERD (gastroesophageal reflux disease)   . Hyperlipidemia     takes Simvastatin nightly  . Osteoarthritis   . Osteopenia   . Urinary incontinence   . Pulmonary hypertension   . Anxiety   . Renal insufficiency   . Coronary artery disease   . Aortic stenosis   . Cancer     Mole on top of head to be removed in July 2013  . Hyperlipidemia   . Hypertension     takes Metoprolol daily  . Peripheral vascular disease   . CHF (congestive heart failure)   . Migraine     hx of  . Constipation   . Diarrhea   . Oligouria   . History of blood transfusion   . Diabetes mellitus     type II;was on Glipizide but has been off x 6mon  . Hypothyroidism     takes Synthroid daily    Past Surgical History  Procedure Laterality Date  . Abdominal hysterectomy    . Tonsillectomy    . Cardiac catheterization  2000    cad  .  Replacement total knee bilateral  05/1998  . Cataract extraction    . Coronary artery bypass graft      od  . Adenosine myoview  2007    benign, EF 69%  . Carotid endarterectomy  2011    Right  . Coronary artery bypass graft  2009  . Aortic valve replacement  2009  . Amputation-left great toe  7/12    Dr August Saucer  . Traumatic amputation of right dip joint of index finger    . Insertion of dialysis catheter  12/18/2010    Procedure: INSERTION OF DIALYSIS CATHETER;  Surgeon: Pryor Ochoa, MD;  Location: [facility] OR;  Service: Vascular;  Laterality: Right;  . Av fistula placement  12/22/2010    Procedure: ARTERIOVENOUS (AV) FISTULA CREATION;  Surgeon: Nilda Simmer, MD;  Location: [facility] OR;  Service: Vascular;  Laterality: Left;  Creation of Left Brachial-Cephalic Fistula  . Appendectomy    . Eye surgery      BIlateral  . Amputation  06/18/2011    Procedure: AMPUTATION DIGIT;  Surgeon: Nadara Mustard, MD;  Location: [facility] OR;  Service: Orthopedics;  Laterality:  Left;  Left 2nd Toe Amputation at MTP Joint    No family history on file.  History   Social History  . Marital Status: Widowed    Spouse Name: [name]    Number of Children: 1  . Years of Education: [name]   Occupational History  . 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 Smoking status: Former Smoker -- 15 years    Types: Cigarettes    Quit date: 01/06/1943  . Smokeless tobacco: Never Used  . Alcohol Use: Yes     Comment: Wine occasionally  . Drug Use: No  . Sexually Active: No   Other Topics Concern  . Not on file   Social History Narrative   Retired - Engineer, petroleum, accts receivable   Review of Systems Appetite is better Weight is up 5#---on new vitamin supplement Gets irritated areas in breasts at times Gas at times--may be causing the breast pain     Objective:   Physical Exam  Constitutional: She appears well-developed and well-nourished. No distress.  Neck: Normal range of motion. Neck supple.  Cardiovascular: Normal rate and regular rhythm.  Exam reveals no gallop.   Murmur heard. Gr 2/6 systolic murmur towards apex  Pulmonary/Chest: Effort normal and breath sounds normal. No respiratory distress. She has no wheezes. She has no rales.  Genitourinary:  Mild periareolar cystic changes in right > left breast. No suspicious lesions  Musculoskeletal: She exhibits no edema and no tenderness.  Lymphadenopathy:    She has no cervical adenopathy.  Skin: No rash noted. No erythema.  Psychiatric: She has a normal mood and affect. Her behavior is normal.          Assessment & Plan:

## 2012-06-07 NOTE — Assessment & Plan Note (Signed)
Studies show statins not useful in those on dialysis  Will stop it

## 2012-06-07 NOTE — Patient Instructions (Addendum)
Please stop the simvastatin---the cholesterol meds have not been found to be helpful for people on dialysis.

## 2012-06-07 NOTE — Assessment & Plan Note (Signed)
BP Readings from Last 3 Encounters:  06/07/12 150/70  12/08/11 140/58  11/12/11 162/60   No increase due to regular hypotension during dialysis

## 2012-06-07 NOTE — Assessment & Plan Note (Signed)
Seems to have good control still Will recheck

## 2012-06-14 ENCOUNTER — Encounter: Payer: Self-pay | Admitting: *Deleted

## 2012-07-19 ENCOUNTER — Telehealth: Payer: Self-pay | Admitting: *Deleted

## 2012-07-19 NOTE — Telephone Encounter (Signed)
No  Just confirm with her that she has not been taking it

## 2012-07-19 NOTE — Telephone Encounter (Signed)
Received a refill request for HCTZ 25mg  qd, per pharmacy med last filled 10/28/2006. I did not find any record of her actively taking this med in emr. Do you want to refill?

## 2012-07-19 NOTE — Telephone Encounter (Signed)
Patient states she doesn't remember calling this in, pt states she called a bunch in but doesn't remember this one.

## 2012-07-21 ENCOUNTER — Telehealth: Payer: Self-pay | Admitting: Internal Medicine

## 2012-07-21 NOTE — Telephone Encounter (Signed)
Mrs Kilbride dropped off a handicap card form to be filled out.

## 2012-07-21 NOTE — Telephone Encounter (Signed)
Form done No charge 

## 2012-07-21 NOTE — Telephone Encounter (Signed)
Form on your desk  

## 2012-07-29 ENCOUNTER — Emergency Department: Payer: Self-pay | Admitting: Emergency Medicine

## 2012-10-25 ENCOUNTER — Ambulatory Visit (INDEPENDENT_AMBULATORY_CARE_PROVIDER_SITE_OTHER): Payer: Medicare Other

## 2012-10-25 DIAGNOSIS — Z23 Encounter for immunization: Secondary | ICD-10-CM

## 2012-11-01 ENCOUNTER — Ambulatory Visit: Payer: Medicare Other

## 2012-11-15 ENCOUNTER — Encounter (INDEPENDENT_AMBULATORY_CARE_PROVIDER_SITE_OTHER): Payer: Self-pay

## 2012-11-15 ENCOUNTER — Ambulatory Visit (INDEPENDENT_AMBULATORY_CARE_PROVIDER_SITE_OTHER): Payer: Medicare Other | Admitting: Cardiovascular Disease

## 2012-11-15 ENCOUNTER — Encounter: Payer: Self-pay | Admitting: Cardiovascular Disease

## 2012-11-15 VITALS — BP 138/54 | HR 68 | Ht 62.0 in | Wt 113.2 lb

## 2012-11-15 DIAGNOSIS — E785 Hyperlipidemia, unspecified: Secondary | ICD-10-CM

## 2012-11-15 DIAGNOSIS — N186 End stage renal disease: Secondary | ICD-10-CM

## 2012-11-15 DIAGNOSIS — E1129 Type 2 diabetes mellitus with other diabetic kidney complication: Secondary | ICD-10-CM

## 2012-11-15 DIAGNOSIS — Z952 Presence of prosthetic heart valve: Secondary | ICD-10-CM

## 2012-11-15 DIAGNOSIS — Z953 Presence of xenogenic heart valve: Secondary | ICD-10-CM | POA: Insufficient documentation

## 2012-11-15 DIAGNOSIS — Z951 Presence of aortocoronary bypass graft: Secondary | ICD-10-CM

## 2012-11-15 DIAGNOSIS — I2510 Atherosclerotic heart disease of native coronary artery without angina pectoris: Secondary | ICD-10-CM

## 2012-11-15 MED ORDER — PRAVASTATIN SODIUM 20 MG PO TABS: 20.0000 mg | ORAL_TABLET | Freq: Every evening | ORAL | Status: DC

## 2012-11-15 NOTE — Assessment & Plan Note (Signed)
Doing well after valve in 2009. Repeat ultrasound at her convenience next year

## 2012-11-15 NOTE — Assessment & Plan Note (Signed)
Suggested we treat her coronary disease aggressively. Continue current medications, start low-dose statin

## 2012-11-15 NOTE — Assessment & Plan Note (Signed)
On hemodialysis 3 days per week. This increases her chance for progressive coronary disease. We'll start a low-dose statin.

## 2012-11-15 NOTE — Assessment & Plan Note (Signed)
Diet controlled diabetes. Hemoglobin A1c reasonably well controlled

## 2012-11-15 NOTE — Assessment & Plan Note (Signed)
Severe CAD by prior cardiac catheterization in 2009, severe LAD disease. CABG x3 at that time.. recommended she start a low dose statin as she is high risk for worsening disease.

## 2012-11-15 NOTE — Patient Instructions (Addendum)
You are doing well.  Please start pravastatin 1/2 pill daily Cholesterol check in 3 months time with Dr. Alphonsus Sias  Please call us if you have new issues that need to be addressed before your next appt.  Your physician wants you to follow-up in: 6 months.  You will receive a reminder letter in the mail two months in advance. If you don't receive a letter, please call our office to schedule the follow-up appointment.

## 2012-11-15 NOTE — Progress Notes (Signed)
Patient ID: [name], female    DOB: April 13, 1920, 79 y.o.   MRN: [id number]  HPI Comments: Mrs Pangelinan is a very pleasant 79 year old woman with history of CAD, bypass surgery x3 vessel in 2009 with bioprosthetic aortic valve placed at that time for severe aortic valve stenosis, diet controlled diabetes, previous smoking, moderate bilateral carotid disease, end-stage renal disease on hemodialysis 3 days per week for the past 2 years who presents for routine followup. She was previous he seen by Dr. Daleen Squibb.  She reports that overall she is doing well. She denies any significant shortness of breath or edema. Occasional shortness of breath while on dialysis and is sometimes placed on oxygen. This is rare.  She has good exercise tolerance. Very active at baseline, does numerous activities with her son. Echocardiogram in 2012 showing normal LV function, well-seated valve, moderate pulmonary hypertension.  EKG shows normal sinus rhythm with rate 68 beats per minute, nonspecific ST and T wave changes, consider old anterior MI   Outpatient Encounter Prescriptions as of 11/15/2012  Medication Sig  . ALPRAZolam (XANAX) 0.5 MG tablet Take 0.5 tablets (0.25 mg total) by mouth at bedtime as needed. For anxiety  . Ascorbic Acid (VITAMIN C) 500 MG CHEW Chew 1 tablet by mouth daily.    Marland Kitchen aspirin 81 MG tablet Take 81 mg by mouth daily.    Marland Kitchen b complex-vitamin c-folic acid (NEPHRO-VITE) 0.8 MG TABS Take 0.8 mg by mouth at bedtime.  Marland Kitchen levothyroxine (SYNTHROID, LEVOTHROID) 75 MCG tablet TAKE ONE TABLET BY MOUTH EVERY DAY  . metoprolol (LOPRESSOR) 50 MG tablet Take 1.5 tablets (75 mg total) by mouth 2 (two) times daily.  . Nutritional Supplements (FEEDING SUPPLEMENT, NEPRO CARB STEADY,) LIQD Take 237 mLs by mouth as needed (missed meal during dialysis.).  Marland Kitchen Polyethyl Glycol-Propyl Glycol (SYSTANE) 0.4-0.3 % SOLN Place 1 drop into the right eye 4 (four) times daily.    Review of Systems  Constitutional: Negative.    HENT: Negative.   Eyes: Negative.   Respiratory: Negative.   Cardiovascular: Negative.   Gastrointestinal: Negative.   Endocrine: Negative.   Musculoskeletal: Negative.   Skin: Negative.   Allergic/Immunologic: Negative.   Neurological: Negative.   Hematological: Negative.   Psychiatric/Behavioral: Negative.   All other systems reviewed and are negative.    BP 138/54  Pulse 68  Ht 5\' 2"  (1.575 m)  Wt 113 lb 4 oz (51.37 kg)  BMI 20.71 kg/m2  Physical Exam  Nursing note and vitals reviewed. Constitutional: She is oriented to person, place, and time. She appears well-developed and well-nourished.  HENT:  Head: Normocephalic.  Nose: Nose normal.  Mouth/Throat: Oropharynx is clear and moist.  Eyes: Conjunctivae are normal. Pupils are equal, round, and reactive to light.  Neck: Normal range of motion. Neck supple. No JVD present.  Cardiovascular: Normal rate, regular rhythm, S1 normal, S2 normal, normal heart sounds and intact distal pulses.  Exam reveals no gallop and no friction rub.   No murmur heard. Pulmonary/Chest: Effort normal and breath sounds normal. No respiratory distress. She has no wheezes. She has no rales. She exhibits no tenderness.  Abdominal: Soft. Bowel sounds are normal. She exhibits no distension. There is no tenderness.  Musculoskeletal: Normal range of motion. She exhibits no edema and no tenderness.  Lymphadenopathy:    She has no cervical adenopathy.  Neurological: She is alert and oriented to person, place, and time. Coordination normal.  Skin: Skin is warm and dry. No rash noted. No erythema.  Psychiatric: She has a normal mood and affect. Her behavior is normal. Judgment and thought content normal.    Assessment and Plan

## 2012-11-15 NOTE — Assessment & Plan Note (Signed)
We have suggested she start pravastatin 10 mg daily with check of her cholesterol in several months time. If no symptoms, may need 20 mg daily

## 2012-11-18 ENCOUNTER — Encounter: Payer: Self-pay | Admitting: Internal Medicine

## 2012-11-18 ENCOUNTER — Ambulatory Visit (INDEPENDENT_AMBULATORY_CARE_PROVIDER_SITE_OTHER): Payer: Medicare Other | Admitting: Internal Medicine

## 2012-11-18 ENCOUNTER — Ambulatory Visit (INDEPENDENT_AMBULATORY_CARE_PROVIDER_SITE_OTHER)
Admission: RE | Admit: 2012-11-18 | Discharge: 2012-11-18 | Disposition: A | Discharge status: Home / Self Care | Payer: Medicare Other | Source: Ambulatory Visit | Attending: Internal Medicine | Admitting: Internal Medicine

## 2012-11-18 VITALS — BP 160/80 | HR 79 | Temp 99.3°F | Wt 113.0 lb

## 2012-11-18 DIAGNOSIS — R0781 Pleurodynia: Secondary | ICD-10-CM | POA: Insufficient documentation

## 2012-11-18 DIAGNOSIS — R071 Chest pain on breathing: Secondary | ICD-10-CM

## 2012-11-18 NOTE — Progress Notes (Signed)
Subjective:    Patient ID: [name], female    DOB: [birth year], 79 y.o.   MRN: [id number]  HPI Here with son  Micah Flesher to the surgeon at [facility] for Moh's follow up yesterday Started having bad chest pain in right upper chest last night Pain with breathing Thought it might be related to the soup and sandwich she had (spicy soup) Now feels it in her back on right side with breathing Pain if sneezing  No fever Pain persisted--called 911. Paramedics this morning --EKG looked okay No cough Some SOB last night-- "I can't get a clear breath"  Hasn't eaten yet today Didn't feel right  Current Outpatient Prescriptions on File Prior to Visit  Medication Sig Dispense Refill  . ALPRAZolam (XANAX) 0.5 MG tablet Take 0.5 tablets (0.25 mg total) by mouth at bedtime as needed. For anxiety  30 tablet  0  . Ascorbic Acid (VITAMIN C) 500 MG CHEW Chew 1 tablet by mouth daily.        Marland Kitchen aspirin 81 MG tablet Take 81 mg by mouth daily.        Marland Kitchen b complex-vitamin c-folic acid (NEPHRO-VITE) 0.8 MG TABS Take 0.8 mg by mouth at bedtime.      Marland Kitchen levothyroxine (SYNTHROID, LEVOTHROID) 75 MCG tablet TAKE ONE TABLET BY MOUTH EVERY DAY  30 tablet  11  . metoprolol (LOPRESSOR) 50 MG tablet Take 1.5 tablets (75 mg total) by mouth 2 (two) times daily.  90 tablet  11  . Nutritional Supplements (FEEDING SUPPLEMENT, NEPRO CARB STEADY,) LIQD Take 237 mLs by mouth as needed (missed meal during dialysis.).  10 Can  0  . Polyethyl Glycol-Propyl Glycol (SYSTANE) 0.4-0.3 % SOLN Place 1 drop into the right eye 4 (four) times daily.      . pravastatin (PRAVACHOL) 20 MG tablet Take 1 tablet (20 mg total) by mouth every evening.  90 tablet  3   No current facility-administered medications on file prior to visit.    Allergies  Allergen Reactions  . Captopril     REACTION: unspecified  . Enalapril Maleate     REACTION: cough  . Nitrofurantoin     REACTION: itching  . Ramipril     REACTION: unspecified  . Sulfa  Antibiotics Other (See Comments)    Reaction unknown  . Verapamil     REACTION: unspecified    Past Medical History  Diagnosis Date  . Anemia     NOS  . Personal history of colonic polyps   . GERD (gastroesophageal reflux disease)   . Hyperlipidemia     takes Simvastatin nightly  . Osteoarthritis   . Osteopenia   . Urinary incontinence   . Pulmonary hypertension   . Anxiety   . Renal insufficiency   . Coronary artery disease   . Aortic stenosis   . Hyperlipidemia   . Hypertension     takes Metoprolol daily  . Peripheral vascular disease   . CHF (congestive heart failure)   . Migraine     hx of  . Constipation   . Diarrhea   . Oligouria   . History of blood transfusion   . Diabetes mellitus     type II;was on Glipizide but has been off x 6mon  . Hypothyroidism     takes Synthroid daily  . Cancer     Mole on top of head to be removed in July 2013    Past Surgical History  Procedure Laterality Date  . Abdominal  hysterectomy    . Tonsillectomy    . Cardiac catheterization  2000    cad  . Replacement total knee bilateral  05/1998  . Cataract extraction    . Coronary artery bypass graft      od  . Adenosine myoview  2007    benign, EF 69%  . Carotid endarterectomy  2011    Right  . Coronary artery bypass graft  2009  . Aortic valve replacement  2009  . Amputation-left great toe  7/12    Dr August Saucer  . Traumatic amputation of right dip joint of index finger    . Insertion of dialysis catheter  12/18/2010    Procedure: INSERTION OF DIALYSIS CATHETER;  Surgeon: Pryor Ochoa, MD;  Location: [facility] OR;  Service: Vascular;  Laterality: Right;  . Av fistula placement  12/22/2010    Procedure: ARTERIOVENOUS (AV) FISTULA CREATION;  Surgeon: Nilda Simmer, MD;  Location: [facility] OR;  Service: Vascular;  Laterality: Left;  Creation of Left Brachial-Cephalic Fistula  . Appendectomy    . Eye surgery      BIlateral  . Amputation  06/18/2011    Procedure: AMPUTATION DIGIT;   Surgeon: Nadara Mustard, MD;  Location: [facility] OR;  Service: Orthopedics;  Laterality: Left;  Left 2nd Toe Amputation at MTP Joint    No family history on file.  History   Social History  . Marital Status: Widowed    Spouse Name: [name]    Number of Children: 1  . Years of Education: [name]   Occupational History  . 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 Smoking status: Former Smoker -- 15 years    Types: Cigarettes    Quit date: 01/06/1943  . Smokeless tobacco: Never Used  . Alcohol Use: Yes     Comment: Wine occasionally  . Drug Use: No  . Sexual Activity: No   Other Topics Concern  . Not on file   Social History Narrative   Retired - Engineer, petroleum, accts receivable   Review of Systems No heartburn but burping some Bowel movement last night --- not really emptied though Didn't start the pravastatin yet    Objective:   Physical Exam  Constitutional: She appears well-developed. No distress.  Neck: Normal range of motion. Neck supple. No thyromegaly present.  Cardiovascular: Normal rate and regular rhythm.  Exam reveals no gallop.   Slight systolic chest murmur at base  Pulmonary/Chest: Effort normal and breath sounds normal. No respiratory distress. She has no wheezes. She has no rales. She exhibits tenderness.  Tender along mid right scapula and anterior axillary line ~T6-7 level  Abdominal: Soft. She exhibits no distension. There is no tenderness. There is no rebound and no guarding.  Musculoskeletal: She exhibits no edema and no tenderness.  Lymphadenopathy:    She has no cervical adenopathy.          Assessment & Plan:

## 2012-11-18 NOTE — Progress Notes (Signed)
Pre-visit discussion using our clinic review tool. No additional management support is needed unless otherwise documented below in the visit note.  

## 2012-11-18 NOTE — Assessment & Plan Note (Addendum)
No signs of infection CXR shows some increased markings on right side---looks the same as 2013 Discussed using her pain pills she has from [facility] Reassured--no signs of heart trouble or clear GI etiology

## 2012-11-20 ENCOUNTER — Encounter (HOSPITAL_COMMUNITY): Payer: Self-pay | Admitting: Emergency Medicine

## 2012-11-20 ENCOUNTER — Inpatient Hospital Stay (HOSPITAL_COMMUNITY)
Admission: EM | Admit: 2012-11-20 | Discharge: 2012-12-06 | DRG: 871 | Disposition: A | Discharge status: Skilled Nursing Facility | Payer: Medicare Other | Attending: Internal Medicine | Admitting: Internal Medicine

## 2012-11-20 ENCOUNTER — Emergency Department (HOSPITAL_COMMUNITY): Payer: Medicare Other

## 2012-11-20 DIAGNOSIS — S0510XA Contusion of eyeball and orbital tissues, unspecified eye, initial encounter: Secondary | ICD-10-CM | POA: Diagnosis present

## 2012-11-20 DIAGNOSIS — A412 Sepsis due to unspecified staphylococcus: Principal | ICD-10-CM | POA: Diagnosis present

## 2012-11-20 DIAGNOSIS — S68118A Complete traumatic metacarpophalangeal amputation of other finger, initial encounter: Secondary | ICD-10-CM

## 2012-11-20 DIAGNOSIS — I10 Essential (primary) hypertension: Secondary | ICD-10-CM | POA: Diagnosis present

## 2012-11-20 DIAGNOSIS — Z7982 Long term (current) use of aspirin: Secondary | ICD-10-CM

## 2012-11-20 DIAGNOSIS — Z953 Presence of xenogenic heart valve: Secondary | ICD-10-CM

## 2012-11-20 DIAGNOSIS — Z952 Presence of prosthetic heart valve: Secondary | ICD-10-CM

## 2012-11-20 DIAGNOSIS — K219 Gastro-esophageal reflux disease without esophagitis: Secondary | ICD-10-CM | POA: Diagnosis present

## 2012-11-20 DIAGNOSIS — N2581 Secondary hyperparathyroidism of renal origin: Secondary | ICD-10-CM | POA: Diagnosis present

## 2012-11-20 DIAGNOSIS — G9340 Encephalopathy, unspecified: Secondary | ICD-10-CM | POA: Diagnosis present

## 2012-11-20 DIAGNOSIS — W19XXXA Unspecified fall, initial encounter: Secondary | ICD-10-CM | POA: Diagnosis present

## 2012-11-20 DIAGNOSIS — E1129 Type 2 diabetes mellitus with other diabetic kidney complication: Secondary | ICD-10-CM | POA: Diagnosis present

## 2012-11-20 DIAGNOSIS — Z882 Allergy status to sulfonamides status: Secondary | ICD-10-CM

## 2012-11-20 DIAGNOSIS — M899 Disorder of bone, unspecified: Secondary | ICD-10-CM

## 2012-11-20 DIAGNOSIS — E876 Hypokalemia: Secondary | ICD-10-CM | POA: Diagnosis not present

## 2012-11-20 DIAGNOSIS — Y92009 Unspecified place in unspecified non-institutional (private) residence as the place of occurrence of the external cause: Secondary | ICD-10-CM

## 2012-11-20 DIAGNOSIS — N058 Unspecified nephritic syndrome with other morphologic changes: Secondary | ICD-10-CM | POA: Diagnosis present

## 2012-11-20 DIAGNOSIS — M1990 Unspecified osteoarthritis, unspecified site: Secondary | ICD-10-CM

## 2012-11-20 DIAGNOSIS — R040 Epistaxis: Secondary | ICD-10-CM | POA: Diagnosis not present

## 2012-11-20 DIAGNOSIS — Z86010 Personal history of colonic polyps: Secondary | ICD-10-CM

## 2012-11-20 DIAGNOSIS — E440 Moderate protein-calorie malnutrition: Secondary | ICD-10-CM | POA: Diagnosis present

## 2012-11-20 DIAGNOSIS — Z87891 Personal history of nicotine dependence: Secondary | ICD-10-CM

## 2012-11-20 DIAGNOSIS — Z951 Presence of aortocoronary bypass graft: Secondary | ICD-10-CM

## 2012-11-20 DIAGNOSIS — A419 Sepsis, unspecified organism: Secondary | ICD-10-CM

## 2012-11-20 DIAGNOSIS — I2789 Other specified pulmonary heart diseases: Secondary | ICD-10-CM

## 2012-11-20 DIAGNOSIS — Z1639 Resistance to other specified antimicrobial drug: Secondary | ICD-10-CM | POA: Diagnosis present

## 2012-11-20 DIAGNOSIS — R0781 Pleurodynia: Secondary | ICD-10-CM

## 2012-11-20 DIAGNOSIS — Z954 Presence of other heart-valve replacement: Secondary | ICD-10-CM

## 2012-11-20 DIAGNOSIS — Z992 Dependence on renal dialysis: Secondary | ICD-10-CM

## 2012-11-20 DIAGNOSIS — I359 Nonrheumatic aortic valve disorder, unspecified: Secondary | ICD-10-CM

## 2012-11-20 DIAGNOSIS — T827XXA Infection and inflammatory reaction due to other cardiac and vascular devices, implants and grafts, initial encounter: Secondary | ICD-10-CM

## 2012-11-20 DIAGNOSIS — I771 Stricture of artery: Secondary | ICD-10-CM

## 2012-11-20 DIAGNOSIS — R197 Diarrhea, unspecified: Secondary | ICD-10-CM

## 2012-11-20 DIAGNOSIS — I2510 Atherosclerotic heart disease of native coronary artery without angina pectoris: Secondary | ICD-10-CM | POA: Diagnosis present

## 2012-11-20 DIAGNOSIS — N186 End stage renal disease: Secondary | ICD-10-CM

## 2012-11-20 DIAGNOSIS — A0472 Enterocolitis due to Clostridium difficile, not specified as recurrent: Secondary | ICD-10-CM

## 2012-11-20 DIAGNOSIS — Z66 Do not resuscitate: Secondary | ICD-10-CM | POA: Diagnosis present

## 2012-11-20 DIAGNOSIS — D72829 Elevated white blood cell count, unspecified: Secondary | ICD-10-CM | POA: Diagnosis present

## 2012-11-20 DIAGNOSIS — I739 Peripheral vascular disease, unspecified: Secondary | ICD-10-CM | POA: Diagnosis present

## 2012-11-20 DIAGNOSIS — F411 Generalized anxiety disorder: Secondary | ICD-10-CM

## 2012-11-20 DIAGNOSIS — R32 Unspecified urinary incontinence: Secondary | ICD-10-CM

## 2012-11-20 DIAGNOSIS — D631 Anemia in chronic kidney disease: Secondary | ICD-10-CM | POA: Diagnosis present

## 2012-11-20 DIAGNOSIS — L280 Lichen simplex chronicus: Secondary | ICD-10-CM

## 2012-11-20 DIAGNOSIS — E039 Hypothyroidism, unspecified: Secondary | ICD-10-CM | POA: Diagnosis present

## 2012-11-20 DIAGNOSIS — R7881 Bacteremia: Secondary | ICD-10-CM

## 2012-11-20 DIAGNOSIS — I272 Pulmonary hypertension, unspecified: Secondary | ICD-10-CM | POA: Diagnosis present

## 2012-11-20 DIAGNOSIS — A4101 Sepsis due to Methicillin susceptible Staphylococcus aureus: Secondary | ICD-10-CM | POA: Diagnosis present

## 2012-11-20 DIAGNOSIS — N390 Urinary tract infection, site not specified: Secondary | ICD-10-CM | POA: Diagnosis present

## 2012-11-20 DIAGNOSIS — E785 Hyperlipidemia, unspecified: Secondary | ICD-10-CM | POA: Diagnosis present

## 2012-11-20 DIAGNOSIS — K2970 Gastritis, unspecified, without bleeding: Secondary | ICD-10-CM | POA: Diagnosis not present

## 2012-11-20 DIAGNOSIS — D696 Thrombocytopenia, unspecified: Secondary | ICD-10-CM

## 2012-11-20 DIAGNOSIS — T826XXA Infection and inflammatory reaction due to cardiac valve prosthesis, initial encounter: Secondary | ICD-10-CM

## 2012-11-20 DIAGNOSIS — IMO0002 Reserved for concepts with insufficient information to code with codable children: Secondary | ICD-10-CM

## 2012-11-20 LAB — URINE MICROSCOPIC-ADD ON

## 2012-11-20 LAB — CBC WITH DIFFERENTIAL/PLATELET
Basophils Absolute: 0 10*3/uL (ref 0.0–0.1)
Basophils Relative: 0 % (ref 0–1)
Eosinophils Relative: 0 % (ref 0–5)
Lymphocytes Relative: 3 % — ABNORMAL LOW (ref 12–46)
Lymphs Abs: 0.4 10*3/uL — ABNORMAL LOW (ref 0.7–4.0)
MCHC: 33.3 g/dL (ref 30.0–36.0)
MCV: 101.8 fL — ABNORMAL HIGH (ref 78.0–100.0)
Neutro Abs: 12.4 10*3/uL — ABNORMAL HIGH (ref 1.7–7.7)
Platelets: 150 10*3/uL (ref 150–400)
RBC: 3.42 MIL/uL — ABNORMAL LOW (ref 3.87–5.11)
RDW: 13.7 % (ref 11.5–15.5)
WBC: 13.3 10*3/uL — ABNORMAL HIGH (ref 4.0–10.5)

## 2012-11-20 LAB — URINALYSIS, ROUTINE W REFLEX MICROSCOPIC
Glucose, UA: NEGATIVE mg/dL
Nitrite: NEGATIVE
Specific Gravity, Urine: 1.016 (ref 1.005–1.030)
pH: 5.5 (ref 5.0–8.0)

## 2012-11-20 LAB — GLUCOSE, CAPILLARY
Glucose-Capillary: 161 mg/dL — ABNORMAL HIGH (ref 70–99)
Glucose-Capillary: 159 mg/dL — ABNORMAL HIGH (ref 70–99)

## 2012-11-20 LAB — COMPREHENSIVE METABOLIC PANEL
ALT: 42 U/L — ABNORMAL HIGH (ref 0–35)
AST: 78 U/L — ABNORMAL HIGH (ref 0–37)
Albumin: 3.5 g/dL (ref 3.5–5.2)
BUN: 76 mg/dL — ABNORMAL HIGH (ref 6–23)
CO2: 20 mEq/L (ref 19–32)
Calcium: 9.8 mg/dL (ref 8.4–10.5)
Chloride: 86 mEq/L — ABNORMAL LOW (ref 96–112)
Creatinine, Ser: 6.73 mg/dL — ABNORMAL HIGH (ref 0.50–1.10)
GFR calc Af Amer: 6 mL/min — ABNORMAL LOW (ref >90)
GFR calc non Af Amer: 5 mL/min — ABNORMAL LOW (ref >90)
Sodium: 130 mEq/L — ABNORMAL LOW (ref 135–145)
Total Bilirubin: 0.7 mg/dL (ref 0.3–1.2)

## 2012-11-20 LAB — LIPASE, BLOOD: Lipase: 15 U/L (ref 11–59)

## 2012-11-20 LAB — MRSA PCR SCREENING: MRSA by PCR: POSITIVE — AB

## 2012-11-20 MED ORDER — ACETAMINOPHEN 325 MG PO TABS
650.0000 mg | ORAL_TABLET | Freq: Once | ORAL | Status: AC
  Administered 2012-11-20: 650 mg via ORAL
  Filled 2012-11-20: qty 2

## 2012-11-20 MED ORDER — VANCOMYCIN 50 MG/ML ORAL SOLUTION
500.0000 mg | Freq: Four times a day (QID) | ORAL | Status: AC
  Administered 2012-11-20 – 2012-12-04 (×50): 500 mg via ORAL
  Filled 2012-11-20 (×58): qty 10

## 2012-11-20 MED ORDER — SODIUM CHLORIDE 0.9 % IJ SOLN
3.0000 mL | Freq: Two times a day (BID) | INTRAMUSCULAR | Status: DC
  Administered 2012-11-22 – 2012-12-06 (×25): 3 mL via INTRAVENOUS

## 2012-11-20 MED ORDER — HYDRALAZINE HCL 20 MG/ML IJ SOLN
INTRAMUSCULAR | Status: AC
  Filled 2012-11-20: qty 1

## 2012-11-20 MED ORDER — SODIUM CHLORIDE 0.9 % IV SOLN: INTRAVENOUS | Status: DC

## 2012-11-20 MED ORDER — HYDRALAZINE HCL 20 MG/ML IJ SOLN
10.0000 mg | Freq: Four times a day (QID) | INTRAMUSCULAR | Status: DC | PRN
  Administered 2012-11-20: 10 mg via INTRAVENOUS
  Filled 2012-11-20: qty 1

## 2012-11-20 MED ORDER — LEVOTHYROXINE SODIUM 75 MCG PO TABS
75.0000 ug | ORAL_TABLET | Freq: Every day | ORAL | Status: DC
  Administered 2012-11-21 – 2012-12-06 (×15): 75 ug via ORAL
  Filled 2012-11-20 (×20): qty 1

## 2012-11-20 MED ORDER — SODIUM CHLORIDE 0.9 % IV BOLUS (SEPSIS)
1000.0000 mL | Freq: Once | INTRAVENOUS | Status: AC
  Administered 2012-11-20: 1000 mL via INTRAVENOUS

## 2012-11-20 MED ORDER — DEXTROSE 5 % IV SOLN
1.0000 g | Freq: Once | INTRAVENOUS | Status: AC
  Administered 2012-11-20: 1 g via INTRAVENOUS
  Filled 2012-11-20: qty 10

## 2012-11-20 MED ORDER — METRONIDAZOLE IN NACL 5-0.79 MG/ML-% IV SOLN
500.0000 mg | Freq: Three times a day (TID) | INTRAVENOUS | Status: DC
  Administered 2012-11-21 – 2012-11-22 (×5): 500 mg via INTRAVENOUS
  Filled 2012-11-20 (×6): qty 100

## 2012-11-20 MED ORDER — SODIUM CHLORIDE 0.9 % IV SOLN
INTRAVENOUS | Status: DC
  Administered 2012-11-20: 19:00:00 via INTRAVENOUS
  Administered 2012-11-21: 10 mL/h via INTRAVENOUS
  Administered 2012-11-28: 500 mL via INTRAVENOUS

## 2012-11-20 MED ORDER — INSULIN ASPART 100 UNIT/ML ~~LOC~~ SOLN
0.0000 [IU] | SUBCUTANEOUS | Status: DC
  Administered 2012-11-20: 2 [IU] via SUBCUTANEOUS
  Administered 2012-11-21 (×2): 1 [IU] via SUBCUTANEOUS

## 2012-11-20 MED ORDER — ONDANSETRON HCL 4 MG/2ML IJ SOLN
4.0000 mg | Freq: Once | INTRAMUSCULAR | Status: AC
  Administered 2012-11-20: 4 mg via INTRAVENOUS
  Filled 2012-11-20: qty 2

## 2012-11-20 MED ORDER — METRONIDAZOLE IN NACL 5-0.79 MG/ML-% IV SOLN
500.0000 mg | Freq: Once | INTRAVENOUS | Status: AC
  Administered 2012-11-20: 500 mg via INTRAVENOUS
  Filled 2012-11-20: qty 100

## 2012-11-20 MED ORDER — ACETAMINOPHEN 325 MG PO TABS
650.0000 mg | ORAL_TABLET | Freq: Four times a day (QID) | ORAL | Status: DC | PRN
Start: 2012-11-20 — End: 2012-12-06
  Administered 2012-11-20 – 2012-12-05 (×4): 650 mg via ORAL
  Filled 2012-11-20 (×4): qty 2

## 2012-11-20 MED ORDER — ACETAMINOPHEN 650 MG RE SUPP
650.0000 mg | Freq: Four times a day (QID) | RECTAL | Status: DC | PRN
  Administered 2012-11-21: 650 mg via RECTAL
  Filled 2012-11-20 (×2): qty 1

## 2012-11-20 MED ORDER — CEFTRIAXONE SODIUM 1 G IJ SOLR
1.0000 g | INTRAMUSCULAR | Status: DC
  Administered 2012-11-21: 1 g via INTRAVENOUS
  Filled 2012-11-20 (×2): qty 10

## 2012-11-20 NOTE — ED Notes (Signed)
NOTIFIED DR. SHELDON IN PERSON OF LAB RESULTS OF CG4 ISTAT LACTIC ACID 2.50mmoI/L, @ 13:58PM, 11/20/2012.

## 2012-11-20 NOTE — ED Notes (Signed)
Pt to rm A8-- via w/c with son-- pt responding to some questions, son states she is normally up and around, lives alone, still drives. Pt has been not feeling well since Thursday, with diarrhea starting yesterday and vomiting today. Went to dialysis on Friday, was unable to complete because of feeling weak. Saw regular MD on Friday and was ordered antibiotics. Pt picked up by son, placed on bed- too weak to move self.

## 2012-11-20 NOTE — H&P (Signed)
[facility] History and Physical  [name] NWG:[id number] DOB: 03-Oct-1920 DOA: 11/20/2012  Referring physician: [place] Sheldon, ER physician PCP: Tillman Abide, MD  Specialists: Nephrology  Chief Complaint: Vomiting and diarrhea  HPI: [name] is a 79 y.o. female  With past medical history of end-stage renal disease, diabetes mellitus, CAD and C. difficile in the past who despite her advanced age and medical problems is rather functional and lives alone with her son nearby. Patient has been in her usual state of health when she complained her son about chills 3 days ago. She went to dialysis on Friday and had a low-grade temp. Dialysis was done partially on that day. Patient to go see her PCP and chest x-ray result which was unremarkable. That time was 99. She stated the weekend with her son and her that time has been feeling worse and apparently last night with some episodes of diarrhea and even fell on the way back to her bed from her walker small bruise above her right eye. Synovitis today this morning that she was having nausea and vomiting and significant amounts of diarrhea. He brought her into the emergency room  The emergency room patient was noted to have a lactic acid level II.9, creatinine 6.73 consistent with her renal failure, anion gap of 24 and a glucose of 206. Her white count was 13.2-92% shift and her urine was positive for strong UTI. Stool studies were sent for C. difficile as was urine culture and patient was started on IV Rocephin and Flagyl. Hospitalists were called for further evaluation and admission.  Review of Systems: Patient seen down emergency room. Currently she is mildly responsive so she is unable to give me any kind of review of systems  Past Medical History  Diagnosis Date  . Anemia     NOS  . Personal history of colonic polyps   . GERD (gastroesophageal reflux disease)   . Hyperlipidemia     takes Simvastatin nightly  . Osteoarthritis   .  Osteopenia   . Urinary incontinence   . Pulmonary hypertension   . Anxiety   . Renal insufficiency   . Coronary artery disease   . Aortic stenosis   . Hyperlipidemia   . Hypertension     takes Metoprolol daily  . Peripheral vascular disease   . CHF (congestive heart failure)   . Migraine     hx of  . Constipation   . Diarrhea   . Oligouria   . History of blood transfusion   . Diabetes mellitus     type II;was on Glipizide but has been off x 6mon  . Hypothyroidism     takes Synthroid daily  . Cancer     Mole on top of head to be removed in July 2013   Past Surgical History  Procedure Laterality Date  . Abdominal hysterectomy    . Tonsillectomy    . Cardiac catheterization  2000    cad  . Replacement total knee bilateral  05/1998  . Cataract extraction    . Coronary artery bypass graft      od  . Adenosine myoview  2007    benign, EF 69%  . Carotid endarterectomy  2011    Right  . Coronary artery bypass graft  2009  . Aortic valve replacement  2009  . Amputation-left great toe  7/12    Dr August Saucer  . Traumatic amputation of right dip joint of index finger    . Insertion  of dialysis catheter  12/18/2010    Procedure: INSERTION OF DIALYSIS CATHETER;  Surgeon: Pryor Ochoa, MD;  Location: [facility] OR;  Service: Vascular;  Laterality: Right;  . Av fistula placement  12/22/2010    Procedure: ARTERIOVENOUS (AV) FISTULA CREATION;  Surgeon: Nilda Simmer, MD;  Location: [facility] OR;  Service: Vascular;  Laterality: Left;  Creation of Left Brachial-Cephalic Fistula  . Appendectomy    . Eye surgery      BIlateral  . Amputation  06/18/2011    Procedure: AMPUTATION DIGIT;  Surgeon: Nadara Mustard, MD;  Location: [facility] OR;  Service: Orthopedics;  Laterality: Left;  Left 2nd Toe Amputation at [facility]   Social History:  reports that she quit smoking about 69 years ago. Her smoking use included Cigarettes. She smoked 0.00 packs per day for 15 years. She has never used smokeless tobacco. She  reports that she drinks alcohol. She reports that she does not use illicit drugs. History obtained from patient's son. She is independent and active. She still drives. She's able to participate in cardiac rehabilitation twice a week  Allergies  Allergen Reactions  . Captopril     REACTION: unspecified  . Enalapril Maleate     REACTION: cough  . Nitrofurantoin     REACTION: itching  . Ramipril     REACTION: unspecified  . Sulfa Antibiotics Other (See Comments)    Reaction unknown  . Verapamil     REACTION: unspecified    Family history: Hypertension and heart disease  Prior to Admission medications   Medication Sig Start Date End Date Taking? Authorizing Provider  ALPRAZolam Prudy Feeler) 0.5 MG tablet Take 0.5 tablets (0.25 mg total) by mouth at bedtime as needed. For anxiety 06/07/12   [name], MD  Ascorbic Acid (VITAMIN C) 500 MG CHEW Chew 1 tablet by mouth daily.      Historical Provider, MD  aspirin 81 MG tablet Take 81 mg by mouth daily.      Historical Provider, MD  b complex-vitamin c-folic acid (NEPHRO-VITE) 0.8 MG TABS Take 0.8 mg by mouth at bedtime.    Historical Provider, MD  levothyroxine (SYNTHROID, LEVOTHROID) 75 MCG tablet TAKE ONE TABLET BY MOUTH EVERY DAY 04/21/12   [name], MD  metoprolol (LOPRESSOR) 50 MG tablet Take 1.5 tablets (75 mg total) by mouth 2 (two) times daily. 03/15/12   Gaylord Shih, MD  Nutritional Supplements (FEEDING SUPPLEMENT, NEPRO CARB STEADY,) LIQD Take 237 mLs by mouth as needed (missed meal during dialysis.). 12/29/10   [name], MD  Polyethyl Glycol-Propyl Glycol (SYSTANE) 0.4-0.3 % SOLN Place 1 drop into the right eye 4 (four) times daily.    Historical Provider, MD  pravastatin (PRAVACHOL) 20 MG tablet Take 1 tablet (20 mg total) by mouth every evening. 11/15/12   Antonieta Iba, MD   Physical Exam: Filed Vitals:   11/20/12 1445  BP: 169/56  Pulse: 85  Temp: 104 F (40 C)  Resp: 16     General:  Somnolent,  response to painful stimuli  Eyes: Sclera nonicteric, extraocular movements appear to be intact  ENT: Normocephalic, patient has some bruising over her right eyelid which she had fallen the previous night  Neck: Supple, no JVD  Cardiovascular: Regular rate and rhythm, S1-S2, soft 2/6 systolic ejection murmur  Respiratory: Clear to auscultation bilaterally  Abdomen: Soft, nontender, nondistended, hypoactive bowel sounds  Skin: No skin breaks, tears or lesions  Musculoskeletal: No clubbing or cyanosis or edema  Psychiatric: Currently patient is minimally responsive, unable to ascertain psychiatric status  Neurologic: Currently patient is minimally responsive, is unable to get cooperative neurological exam, no overt deficits  Labs on Admission:  Basic Metabolic Panel:  Recent Labs Lab 11/20/12 1339  NA 130*  K 4.7  CL 86*  CO2 20  GLUCOSE 206*  BUN 76*  CREATININE 6.73*  CALCIUM 9.8   Liver Function Tests:  Recent Labs Lab 11/20/12 1339  AST 78*  ALT 42*  ALKPHOS 133*  BILITOT 0.7  PROT 8.3  ALBUMIN 3.5    Recent Labs Lab 11/20/12 1339  LIPASE 15   CBC:  Recent Labs Lab 11/20/12 1339  WBC 13.3*  NEUTROABS 12.4*  HGB 11.6*  HCT 34.8*  MCV 101.8*  PLT 150    Radiological Exams on Admission: Dg Chest Port 1 View  11/20/2012   CLINICAL DATA:  Fever  EXAM: PORTABLE CHEST - 1 VIEW  COMPARISON:  11/18/2012  FINDINGS: Prosthetic cardiac valve also evident. Negative for CHF or pneumonia. No effusion or pneumothorax. Chronic apical scarring. No significant interval change.  IMPRESSION: Stable chest exam.  No superimposed acute process   Electronically Signed   By: Ruel Favors M.D.   On: 11/20/2012 14:31    EKG: Independently reviewed. Normal sinus rhythm with questionable inverted T waves in the septal leads +v2  Assessment/Plan Principal Problem:   Sepsis: Patient's pressure has since much improved, however consider more about the possibility of  hypotension given that her pressure on admission was 210 and now is in 120. IV fluids plus IV antibiotics. Son confirms DO NOT RESUSCITATE status. Place and stepped down unit if she improves in the next 12 hours, will determine long-term outcome Active Problems:   Type II or unspecified type diabetes mellitus with renal manifestations, not stated as uncontrolled(250.40): Sliding she only for now   HYPERTENSION:: Antihypertensives given drop in blood pressure   PULMONARY HYPERTENSION   GERD: Holding on any PPI given concerns for C. difficile   S/P aortic valve replacement: Monitor fluid status   End stage renal disease: Will contact nephrology for dialysis next dose is due tomorrow   Diarrhea: Started prophylactic IV Flagyl and by mouth vancomycin until C. difficile cultures are negative UTI: IV Rocephin   Code Status: Patient's son confirms DO NOT RESUSCITATE status  Family Communication: Spoke with son at the bedside  Disposition Plan: Here for at least a few days  Time spent: 40 minutes  Hollice Espy [facility] Pager [phone]  If 7PM-7AM, please contact night-coverage [email or web address] Password [email or web address] 11/20/2012, 5:03 PM

## 2012-11-20 NOTE — Progress Notes (Signed)
Attempted to receive report. Nurse gowned up and will call me back. Barbera Setters

## 2012-11-20 NOTE — ED Notes (Signed)
Pt to department vomiting and diarrhea since this morning. Pt is a dialysis pt on MWF and did not get her full treatment in on Friday. Pt reports nausea at triage. Reports that she has had a chest x-ray and EKG done at MD office on Friday. Family reports that she is normally up and active but no over the past couple of days.

## 2012-11-20 NOTE — ED Notes (Signed)
Rito Ehrlich, admitting MD at bedside

## 2012-11-20 NOTE — ED Notes (Signed)
[name], NS called [facility] for Level 2 Code Sepsis

## 2012-11-20 NOTE — Progress Notes (Signed)
Attempted to received report from ED. No answer. Will try again in a few minutes. Barbera Setters

## 2012-11-20 NOTE — ED Provider Notes (Signed)
CSN: [id number]     Arrival date & time 11/20/12  1248 History   First MD Initiated Contact with Patient 11/20/12 1315     Chief Complaint  Patient presents with  . Diarrhea   (Consider location/radiation/quality/duration/timing/severity/associated sxs/prior Treatment) Patient is a 79 y.o. female presenting with diarrhea.  Diarrhea  Level 5 caveat due to somnolence Pt brought to the ED by son who reports she has not been feeling well since late last week, 4-5 days ago, was complaining of pleuritic R sided chest pains and cough, worse with deep breathing. She was seen in PCP office on Friday (2 days ago) for these symptoms and had a CXR which was reportedy normal. Since then she has had progessively worsening frequent watery stools and today multiple episodes of emesis. She is confused this afternoon, not at her baseline, unable to get up and walk per her usual self. Son states she was able to take some of her medications this morning but vomited her breakfast.   Past Medical History  Diagnosis Date  . Anemia     NOS  . Personal history of colonic polyps   . GERD (gastroesophageal reflux disease)   . Hyperlipidemia     takes Simvastatin nightly  . Osteoarthritis   . Osteopenia   . Urinary incontinence   . Pulmonary hypertension   . Anxiety   . Renal insufficiency   . Coronary artery disease   . Aortic stenosis   . Hyperlipidemia   . Hypertension     takes Metoprolol daily  . Peripheral vascular disease   . CHF (congestive heart failure)   . Migraine     hx of  . Constipation   . Diarrhea   . Oligouria   . History of blood transfusion   . Diabetes mellitus     type II;was on Glipizide but has been off x 6mon  . Hypothyroidism     takes Synthroid daily  . Cancer     Mole on top of head to be removed in July 2013   Past Surgical History  Procedure Laterality Date  . Abdominal hysterectomy    . Tonsillectomy    . Cardiac catheterization  2000    cad  . Replacement  total knee bilateral  05/1998  . Cataract extraction    . Coronary artery bypass graft      od  . Adenosine myoview  2007    benign, EF 69%  . Carotid endarterectomy  2011    Right  . Coronary artery bypass graft  2009  . Aortic valve replacement  2009  . Amputation-left great toe  7/12    Dr August Saucer  . Traumatic amputation of right dip joint of index finger    . Insertion of dialysis catheter  12/18/2010    Procedure: INSERTION OF DIALYSIS CATHETER;  Surgeon: Pryor Ochoa, MD;  Location: [facility] OR;  Service: Vascular;  Laterality: Right;  . Av fistula placement  12/22/2010    Procedure: ARTERIOVENOUS (AV) FISTULA CREATION;  Surgeon: Nilda Simmer, MD;  Location: [facility] OR;  Service: Vascular;  Laterality: Left;  Creation of Left Brachial-Cephalic Fistula  . Appendectomy    . Eye surgery      BIlateral  . Amputation  06/18/2011    Procedure: AMPUTATION DIGIT;  Surgeon: Nadara Mustard, MD;  Location: [facility] OR;  Service: Orthopedics;  Laterality: Left;  Left 2nd Toe Amputation at [facility]   History reviewed. No pertinent family history. History  Substance  Use Topics  . Smoking status: Former Smoker -- 15 years    Types: Cigarettes    Quit date: 01/06/1943  . Smokeless tobacco: Never Used  . Alcohol Use: Yes     Comment: Wine occasionally   OB History   Grav Para Term Preterm Abortions TAB SAB Ect Mult Living                 Review of Systems  Gastrointestinal: Positive for diarrhea.   Unable to assess due to mental status.   Allergies  Captopril; Enalapril maleate; Nitrofurantoin; Ramipril; Sulfa antibiotics; and Verapamil  Home Medications   Current Outpatient Rx  Name  Route  Sig  Dispense  Refill  . ALPRAZolam (XANAX) 0.5 MG tablet   Oral   Take 0.5 tablets (0.25 mg total) by mouth at bedtime as needed. For anxiety   30 tablet   0   . Ascorbic Acid (VITAMIN C) 500 MG CHEW   Oral   Chew 1 tablet by mouth daily.           Marland Kitchen aspirin 81 MG tablet   Oral   Take 81  mg by mouth daily.           Marland Kitchen b complex-vitamin c-folic acid (NEPHRO-VITE) 0.8 MG TABS   Oral   Take 0.8 mg by mouth at bedtime.         Marland Kitchen levothyroxine (SYNTHROID, LEVOTHROID) 75 MCG tablet      TAKE ONE TABLET BY MOUTH EVERY DAY   30 tablet   11   . metoprolol (LOPRESSOR) 50 MG tablet   Oral   Take 1.5 tablets (75 mg total) by mouth 2 (two) times daily.   90 tablet   11   . Nutritional Supplements (FEEDING SUPPLEMENT, NEPRO CARB STEADY,) LIQD   Oral   Take 237 mLs by mouth as needed (missed meal during dialysis.).   10 Can   0   . Polyethyl Glycol-Propyl Glycol (SYSTANE) 0.4-0.3 % SOLN   Right Eye   Place 1 drop into the right eye 4 (four) times daily.         . pravastatin (PRAVACHOL) 20 MG tablet   Oral   Take 1 tablet (20 mg total) by mouth every evening.   90 tablet   3    BP 181/76  Pulse 93  Temp(Src) 105 F (40.6 C) (Rectal)  Resp 25  SpO2 100% Physical Exam  Nursing note and vitals reviewed. Constitutional: She is oriented to person, place, and time. She appears well-developed and well-nourished.  HENT:  Head: Normocephalic.  Healing ecchymosis of R periorbital region  Eyes: EOM are normal. Pupils are equal, round, and reactive to light.  Neck: Normal range of motion. Neck supple.  Cardiovascular: Normal rate, normal heart sounds and intact distal pulses.   Pulmonary/Chest: Effort normal and breath sounds normal. She has no wheezes. She has no rales.  Abdominal: Soft. She exhibits no distension. There is no tenderness. There is no rebound and no guarding.  Musculoskeletal: Normal range of motion. She exhibits no edema and no tenderness.  Neurological: She is alert and oriented to person, place, and time. She has normal strength. No cranial nerve deficit or sensory deficit.  Skin: Skin is warm and dry. No rash noted.  Psychiatric: She has a normal mood and affect.    ED Course  Procedures (including critical care time) Labs Review Labs  Reviewed  CLOSTRIDIUM DIFFICILE BY PCR - Abnormal; Notable for the following:  C difficile by pcr POSITIVE (*)    All other components within normal limits  MRSA PCR SCREENING - Abnormal; Notable for the following:    MRSA by PCR POSITIVE (*)    All other components within normal limits  CBC WITH DIFFERENTIAL - Abnormal; Notable for the following:    WBC 13.3 (*)    RBC 3.42 (*)    Hemoglobin 11.6 (*)    HCT 34.8 (*)    MCV 101.8 (*)    Neutrophils Relative % 93 (*)    Neutro Abs 12.4 (*)    Lymphocytes Relative 3 (*)    Lymphs Abs 0.4 (*)    All other components within normal limits  COMPREHENSIVE METABOLIC PANEL - Abnormal; Notable for the following:    Sodium 130 (*)    Chloride 86 (*)    Glucose, Bld 206 (*)    BUN 76 (*)    Creatinine, Ser 6.73 (*)    AST 78 (*)    ALT 42 (*)    Alkaline Phosphatase 133 (*)    GFR calc non Af Amer 5 (*)    GFR calc Af Amer 6 (*)    All other components within normal limits  URINALYSIS, ROUTINE W REFLEX MICROSCOPIC - Abnormal; Notable for the following:    APPearance CLOUDY (*)    Hgb urine dipstick MODERATE (*)    Ketones, ur 15 (*)    Protein, ur >300 (*)    Leukocytes, UA LARGE (*)    All other components within normal limits  URINE MICROSCOPIC-ADD ON - Abnormal; Notable for the following:    Squamous Epithelial / LPF MANY (*)    Bacteria, UA MANY (*)    All other components within normal limits  HEMOGLOBIN A1C - Abnormal; Notable for the following:    Hemoglobin A1C 6.2 (*)    Mean Plasma Glucose 131 (*)    All other components within normal limits  BASIC METABOLIC PANEL - Abnormal; Notable for the following:    Sodium 132 (*)    Chloride 93 (*)    CO2 18 (*)    Glucose, Bld 116 (*)    BUN 84 (*)    Creatinine, Ser 7.06 (*)    GFR calc non Af Amer 5 (*)    GFR calc Af Amer 5 (*)    All other components within normal limits  CBC - Abnormal; Notable for the following:    WBC 16.6 (*)    RBC 3.01 (*)    Hemoglobin 10.2  (*)    HCT 30.6 (*)    MCV 101.7 (*)    Platelets 104 (*)    All other components within normal limits  GLUCOSE, CAPILLARY - Abnormal; Notable for the following:    Glucose-Capillary 161 (*)    All other components within normal limits  GLUCOSE, CAPILLARY - Abnormal; Notable for the following:    Glucose-Capillary 159 (*)    All other components within normal limits  GLUCOSE, CAPILLARY - Abnormal; Notable for the following:    Glucose-Capillary 132 (*)    All other components within normal limits  GLUCOSE, CAPILLARY - Abnormal; Notable for the following:    Glucose-Capillary 128 (*)    All other components within normal limits  PHOSPHORUS - Abnormal; Notable for the following:    Phosphorus 5.5 (*)    All other components within normal limits  GLUCOSE, CAPILLARY - Abnormal; Notable for the following:    Glucose-Capillary 152 (*)  All other components within normal limits  GLUCOSE, CAPILLARY - Abnormal; Notable for the following:    Glucose-Capillary 117 (*)    All other components within normal limits  CG4 I-STAT (LACTIC ACID) - Abnormal; Notable for the following:    Lactic Acid, Venous 2.94 (*)    All other components within normal limits  CULTURE, BLOOD (ROUTINE X 2)  CULTURE, BLOOD (ROUTINE X 2)  URINE CULTURE  LIPASE, BLOOD  INFLUENZA PANEL BY PCR  GLUCOSE, CAPILLARY  GLUCOSE, CAPILLARY  CBC  COMPREHENSIVE METABOLIC PANEL  HEPATITIS B E ANTIGEN   Imaging Review No results found.  EKG Interpretation    Date/Time:    Ventricular Rate:  93 PR Interval:  176 QRS Duration: 100 QT Interval:  389 QTC Calculation: 484 R Axis:   -127 Text Interpretation:  Right and left arm electrode reversal, interpretation assumes no reversal Sinus rhythm Probable anteroseptal infarct, recent No significant change since last tracing            MDM   1. UTI (urinary tract infection)   2. Diarrhea   3. End stage renal disease   4. Sepsis     Pt with fever of 105F  and mild tachypnea but no tachycardia or hypotension to suggest septic shock. She had only 2hrs of dialysis on Friday due to being at her PCP office late in the afternoon. She has not been hospitalized or on any Abx recently, but has personal history of C-Diff colitis in the past. Will check for source of infection, APAP for temp control, BP is very high, cautious use of IVF in setting of ESRD on HD.     Zaydah Nawabi B. Bernette Mayers, MD 11/22/12 [phone]

## 2012-11-21 ENCOUNTER — Telehealth: Payer: Self-pay | Admitting: *Deleted

## 2012-11-21 LAB — CBC
HCT: 30.6 % — ABNORMAL LOW (ref 36.0–46.0)
MCHC: 33.3 g/dL (ref 30.0–36.0)
MCV: 101.7 fL — ABNORMAL HIGH (ref 78.0–100.0)
RBC: 3.01 MIL/uL — ABNORMAL LOW (ref 3.87–5.11)
RDW: 14 % (ref 11.5–15.5)
WBC: 16.6 10*3/uL — ABNORMAL HIGH (ref 4.0–10.5)

## 2012-11-21 LAB — BASIC METABOLIC PANEL
BUN: 84 mg/dL — ABNORMAL HIGH (ref 6–23)
Creatinine, Ser: 7.06 mg/dL — ABNORMAL HIGH (ref 0.50–1.10)
GFR calc Af Amer: 5 mL/min — ABNORMAL LOW (ref >90)
GFR calc non Af Amer: 5 mL/min — ABNORMAL LOW (ref >90)
Potassium: 4.3 mEq/L (ref 3.5–5.1)
Sodium: 132 mEq/L — ABNORMAL LOW (ref 135–145)

## 2012-11-21 LAB — CLOSTRIDIUM DIFFICILE BY PCR: Toxigenic C. Difficile by PCR: POSITIVE — AB

## 2012-11-21 LAB — URINE CULTURE: Culture: 50000

## 2012-11-21 LAB — INFLUENZA PANEL BY PCR (TYPE A & B)
Influenza A By PCR: NEGATIVE
Influenza B By PCR: NEGATIVE

## 2012-11-21 LAB — GLUCOSE, CAPILLARY
Glucose-Capillary: 152 mg/dL — ABNORMAL HIGH (ref 70–99)
Glucose-Capillary: 117 mg/dL — ABNORMAL HIGH (ref 70–99)
Glucose-Capillary: 82 mg/dL (ref 70–99)

## 2012-11-21 LAB — HEMOGLOBIN A1C
Hgb A1c MFr Bld: 6.2 % — ABNORMAL HIGH (ref <5.7)
Mean Plasma Glucose: 131 mg/dL — ABNORMAL HIGH (ref <117)

## 2012-11-21 MED ORDER — METOPROLOL TARTRATE 25 MG PO TABS
75.0000 mg | ORAL_TABLET | Freq: Two times a day (BID) | ORAL | Status: DC
  Administered 2012-11-21 – 2012-12-06 (×30): 75 mg via ORAL
  Filled 2012-11-21 (×31): qty 1

## 2012-11-21 MED ORDER — ALTEPLASE 2 MG IJ SOLR: 2.0000 mg | Freq: Once | INTRAMUSCULAR | Status: DC | PRN

## 2012-11-21 MED ORDER — NEPRO/CARBSTEADY PO LIQD: 237.0000 mL | ORAL | Status: DC | PRN

## 2012-11-21 MED ORDER — SODIUM CHLORIDE 0.9 % IV SOLN: 100.0000 mL | INTRAVENOUS | Status: DC | PRN

## 2012-11-21 MED ORDER — RENA-VITE PO TABS
1.0000 | ORAL_TABLET | Freq: Every day | ORAL | Status: DC
  Administered 2012-11-21: 22:00:00 via ORAL
  Administered 2012-11-22 – 2012-12-05 (×13): 1 via ORAL
  Filled 2012-11-21 (×17): qty 1

## 2012-11-21 MED ORDER — DARBEPOETIN ALFA-POLYSORBATE 25 MCG/0.42ML IJ SOLN
INTRAMUSCULAR | Status: AC
  Administered 2012-11-21: 25 ug via INTRAVENOUS
  Filled 2012-11-21: qty 0.42

## 2012-11-21 MED ORDER — PENTAFLUOROPROP-TETRAFLUOROETH EX AERO: 1.0000 "application " | INHALATION_SPRAY | CUTANEOUS | Status: DC | PRN

## 2012-11-21 MED ORDER — DARBEPOETIN ALFA-POLYSORBATE 25 MCG/0.42ML IJ SOLN
25.0000 ug | INTRAMUSCULAR | Status: DC
  Administered 2012-11-21 – 2012-12-05 (×3): 25 ug via INTRAVENOUS
  Filled 2012-11-21 (×4): qty 0.42

## 2012-11-21 MED ORDER — HEPARIN SODIUM (PORCINE) 1000 UNIT/ML DIALYSIS: 4200.0000 [IU] | Freq: Once | INTRAMUSCULAR | Status: DC

## 2012-11-21 MED ORDER — VITAMIN C 500 MG PO CHEW: 1.0000 | CHEWABLE_TABLET | Freq: Every day | ORAL | Status: DC

## 2012-11-21 MED ORDER — ASPIRIN 81 MG PO TABS: 81.0000 mg | ORAL_TABLET | Freq: Every day | ORAL | Status: DC

## 2012-11-21 MED ORDER — DOXERCALCIFEROL 4 MCG/2ML IV SOLN
INTRAVENOUS | Status: AC
  Administered 2012-11-21: 4 ug via INTRAVENOUS
  Filled 2012-11-21: qty 2

## 2012-11-21 MED ORDER — LIDOCAINE HCL (PF) 1 % IJ SOLN: 5.0000 mL | INTRAMUSCULAR | Status: DC | PRN

## 2012-11-21 MED ORDER — ASPIRIN EC 81 MG PO TBEC
81.0000 mg | DELAYED_RELEASE_TABLET | Freq: Every day | ORAL | Status: DC
  Administered 2012-11-22 – 2012-12-01 (×10): 81 mg via ORAL
  Filled 2012-11-21 (×10): qty 1

## 2012-11-21 MED ORDER — INSULIN ASPART 100 UNIT/ML ~~LOC~~ SOLN
0.0000 [IU] | Freq: Three times a day (TID) | SUBCUTANEOUS | Status: DC
  Administered 2012-11-22: 1 [IU] via SUBCUTANEOUS
  Administered 2012-11-22 – 2012-11-24 (×2): 2 [IU] via SUBCUTANEOUS
  Administered 2012-11-24: 3 [IU] via SUBCUTANEOUS
  Administered 2012-11-25 – 2012-11-26 (×2): 2 [IU] via SUBCUTANEOUS
  Administered 2012-11-26: 5 [IU] via SUBCUTANEOUS
  Administered 2012-11-26: 2 [IU] via SUBCUTANEOUS
  Administered 2012-11-27: 1 [IU] via SUBCUTANEOUS
  Administered 2012-11-27: 3 [IU] via SUBCUTANEOUS
  Administered 2012-11-28 – 2012-12-03 (×3): 2 [IU] via SUBCUTANEOUS
  Administered 2012-12-03 – 2012-12-04 (×2): 1 [IU] via SUBCUTANEOUS
  Administered 2012-12-04: 2 [IU] via SUBCUTANEOUS
  Administered 2012-12-04: 1 [IU] via SUBCUTANEOUS
  Administered 2012-12-05: 3 [IU] via SUBCUTANEOUS
  Administered 2012-12-06: 2 [IU] via SUBCUTANEOUS

## 2012-11-21 MED ORDER — DOXERCALCIFEROL 4 MCG/2ML IV SOLN
4.0000 ug | INTRAVENOUS | Status: DC
  Administered 2012-11-21 – 2012-12-05 (×5): 4 ug via INTRAVENOUS
  Filled 2012-11-21 (×9): qty 2

## 2012-11-21 MED ORDER — VITAMIN C 500 MG PO TABS
500.0000 mg | ORAL_TABLET | Freq: Every day | ORAL | Status: DC
  Administered 2012-11-22 – 2012-12-06 (×15): 500 mg via ORAL
  Filled 2012-11-21 (×15): qty 1

## 2012-11-21 MED ORDER — LIDOCAINE-PRILOCAINE 2.5-2.5 % EX CREA: 1.0000 "application " | TOPICAL_CREAM | CUTANEOUS | Status: DC | PRN

## 2012-11-21 MED ORDER — FOLIC ACID 0.5 MG HALF TAB
0.5000 mg | ORAL_TABLET | Freq: Every day | ORAL | Status: DC
  Administered 2012-11-22 – 2012-12-06 (×15): 0.5 mg via ORAL
  Filled 2012-11-21 (×15): qty 1

## 2012-11-21 MED ORDER — FOLIC ACID 400 MCG PO TABS: 400.0000 ug | ORAL_TABLET | Freq: Every day | ORAL | Status: DC

## 2012-11-21 MED ORDER — LEVOTHYROXINE SODIUM 75 MCG PO TABS: 75.0000 ug | ORAL_TABLET | Freq: Every day | ORAL | Status: DC

## 2012-11-21 MED ORDER — PENTAFLUOROPROP-TETRAFLUOROETH EX AERO
INHALATION_SPRAY | CUTANEOUS | Status: AC
  Administered 2012-11-21: 18:00:00
  Filled 2012-11-21: qty 103.5

## 2012-11-21 MED ORDER — HEPARIN SODIUM (PORCINE) 1000 UNIT/ML DIALYSIS: 1000.0000 [IU] | INTRAMUSCULAR | Status: DC | PRN

## 2012-11-21 MED ORDER — WHITE PETROLATUM GEL
Status: AC
  Administered 2012-11-21: 0.2
  Filled 2012-11-21: qty 5

## 2012-11-21 MED ORDER — VANCOMYCIN HCL 500 MG IV SOLR
500.0000 mg | INTRAVENOUS | Status: DC
  Filled 2012-11-21 (×2): qty 500

## 2012-11-21 MED ORDER — HEPARIN SODIUM (PORCINE) 1000 UNIT/ML IJ SOLN
4200.0000 [IU] | Freq: Once | INTRAMUSCULAR | Status: AC
  Administered 2012-11-21: 4200 [IU] via INTRAVENOUS

## 2012-11-21 MED ORDER — VANCOMYCIN HCL 10 G IV SOLR
1250.0000 mg | INTRAVENOUS | Status: AC
  Filled 2012-11-21: qty 1250

## 2012-11-21 NOTE — Progress Notes (Signed)
Utilization Review Completed.  

## 2012-11-21 NOTE — Progress Notes (Signed)
[facility] Progress Note [place] TEAM 1 - Stepdown/ICU TEAM   [name] NWG:[id number] DOB: 02-20-1920 DOA: 11/20/2012 PCP: Tillman Abide, MD  Admit HPI / Brief Narrative: 79 y.o. female with past medical history of end-stage renal disease, diabetes mellitus, CAD and C. difficile in the past who despite her advanced age and medical problems is rather functional and lives alone with her son nearby. Patient had been in her usual state of health when she complained to her son about chills 3 days prior to admit. She went to dialysis on Friday and had a low-grade temp. Dialysis was done partially on that day. Patient visited her PCP and a chest x-ray was unremarkable. She spent the weekend with her son, and was brought to the ED when she was noted to be progressively weaker with diarrhea.  In the emergency room the patient was noted to have a lactic acid level 11.9, anion gap of 24 and a glucose of 206. Her white count was 13.2-92% shift and her urine was positive for UTI. Stool studies were sent for C. difficile as was urine culture and patient was started on IV Rocephin and Flagyl.   Assessment/Plan:  Sepsis Due to pyelo +/- C diff - sepsis physiology is improving   Gram + cocci bacteremia - 2/2 blood cx + Cont empiric abx - f/u speciation and sensitivities to determine if SBE eval will be needed  C diff colitis - Diarrhea  Oral flagyl + oral vanc - follow clinically - avoid PPI - will require prolonged tx due to need for concomitant abx to tx pyelo   UTI/Pyelonephritis  Cont empiric tx - f/u culture data  ESRD on HD  Nephrology is following  Anemia of CKD Follow hemoglobin trend - no evidence of acute blood loss  CAD w/ Hx CABG Denies chest pain  DM2 CBG reasonably controlled at the present time - follow   HTN Poorly controlled at present - adjust treatment plan and follow if remains elevated postdialysis today  Pulmonary HTN  GERD   S/P AoVR Does not require  chronic anticoagulation due to use of bioprosthetic valve  Code Status: NO CODE BLUE Family Communication: no family present at time of exam Disposition Plan: SDU  Consultants: Nephrology   Procedures: none  Antibiotics: Rocephin 11/16 >> Vanc 11/16 >> Flagyl 11/16 >>  DVT prophylaxis: SCDs  HPI/Subjective: The patient is lethargic but arousable.  She denies chest pain or shortness of breath.  She denies abdominal pain.  She is somewhat confused when I ask her detailed questions.  Objective: Blood pressure 154/64, pulse 93, temperature 101 F (38.3 C), temperature source Core (Comment), resp. rate 28, height 5\' 2"  (1.575 m), weight 54.3 kg (119 lb 11.4 oz), SpO2 98.00%.  Intake/Output Summary (Last 24 hours) at 11/21/12 1243 Last data filed at 11/21/12 0800  Gross per 24 hour  Intake    500 ml  Output      0 ml  Net    500 ml   Exam: General: No acute respiratory distress Lungs: Clear to auscultation bilaterally without wheezes or crackles Cardiovascular: Regular rate and rhythm without gallop or rub normal S1 and S2 Abdomen: Nontender, nondistended, soft, bowel sounds positive, no rebound, no ascites, no appreciable mass Extremities: No significant cyanosis, clubbing, or edema bilateral lower extremities  Data Reviewed: Basic Metabolic Panel:  Recent Labs Lab 11/20/12 1339 11/21/12 0507  NA 130* 132*  K 4.7 4.3  CL 86* 93*  CO2 20 18*  GLUCOSE 206* 116*  BUN 76* 84*  CREATININE 6.73* 7.06*  CALCIUM 9.8 8.8  PHOS  --  5.5*   Liver Function Tests:  Recent Labs Lab 11/20/12 1339  AST 78*  ALT 42*  ALKPHOS 133*  BILITOT 0.7  PROT 8.3  ALBUMIN 3.5    Recent Labs Lab 11/20/12 1339  LIPASE 15   CBC:  Recent Labs Lab 11/20/12 1339 11/21/12 0507  WBC 13.3* 16.6*  NEUTROABS 12.4*  --   HGB 11.6* 10.2*  HCT 34.8* 30.6*  MCV 101.8* 101.7*  PLT 150 104*   CBG:  Recent Labs Lab 11/20/12 1843 11/20/12 1948 11/20/12 2348 11/21/12 0402   GLUCAP 161* 159* 132* 128*    Recent Results (from the past 240 hour(s))  CULTURE, BLOOD (ROUTINE X 2)     Status: None   Collection Time    11/20/12  1:39 PM      Result Value Range Status   Specimen Description BLOOD RIGHT ARM   Final   Special Requests BOTTLES DRAWN AEROBIC AND ANAEROBIC 4CC   Final   Culture  Setup Time     Final   Value: 11/20/2012 17:58     Performed at [facility]   Culture     Final   Value: GRAM POSITIVE COCCI IN CLUSTERS     Note: Gram Stain Report Called to,Read Back By and Verified With: Bonnee Quin RN on 11/21/12 at 03:10 by Christie Nottingham     Performed at [facility]   Report Status PENDING   Incomplete  CULTURE, BLOOD (ROUTINE X 2)     Status: None   Collection Time    11/20/12  2:19 PM      Result Value Range Status   Specimen Description BLOOD RIGHT HAND   Final   Special Requests BOTTLES DRAWN AEROBIC AND ANAEROBIC 10CC   Final   Culture  Setup Time     Final   Value: 11/20/2012 17:58     Performed at [facility]   Culture     Final   Value: GRAM POSITIVE COCCI IN CLUSTERS     Note: Gram Stain Report Called to,Read Back By and Verified With: Bonnee Quin RN on 11/21/12 at 03:10 by Christie Nottingham     Performed at [facility]   Report Status PENDING   Incomplete  MRSA PCR SCREENING     Status: Abnormal   Collection Time    11/20/12  5:52 PM      Result Value Range Status   MRSA by PCR POSITIVE (*) NEGATIVE Final   Comment:            The GeneXpert MRSA Assay (FDA     approved for NASAL specimens     only), is one component of a     comprehensive MRSA colonization     surveillance program. It is not     intended to diagnose MRSA     infection nor to guide or     monitor treatment for     MRSA infections.     RESULT CALLED TO, READ BACK BY AND VERIFIED WITH:     GRACOU,R RN 1949 11/20/12 MITCHELL,L  CLOSTRIDIUM DIFFICILE BY PCR     Status: Abnormal   Collection Time    11/20/12  8:27 PM      Result Value  Range Status   C difficile by pcr POSITIVE (*) NEGATIVE Final   Comment: CRITICAL RESULT CALLED TO, READ  BACK BY AND VERIFIED WITH:     PETTIFORD A RN 11/21/12 0805 COSTELLO B     Studies:  Recent x-ray studies have been reviewed in detail by the Attending Physician  Scheduled Meds:  Scheduled Meds: . cefTRIAXone (ROCEPHIN)  IV  1 g Intravenous Q24H  . darbepoetin  25 mcg Intravenous Q Mon-HD  . doxercalciferol  4 mcg Intravenous Q M,W,F-HD  . insulin aspart  0-9 Units Subcutaneous Q4H  . levothyroxine  75 mcg Oral QAC breakfast  . vancomycin  500 mg Oral Q6H   And  . metronidazole  500 mg Intravenous Q8H  . sodium chloride  3 mL Intravenous Q12H  . vancomycin (VANCOCIN) 1250 mg IVPB  1,250 mg Intravenous STAT  . vancomycin (VANCOCIN) 500 mg IVPB  500 mg Intravenous Q M,W,F-HD    Time spent on care of this patient: 35 mins   [facility] T  [facility] Office  [phone] Pager - Text Page per Loretha Stapler as per below:  On-Call/Text Page:      [email or web address]      password [email or web address]  If 7PM-7AM, please contact night-coverage [email or web address] Password [email or web address] 11/21/2012, 12:43 PM   LOS: 1 day

## 2012-11-21 NOTE — Consult Note (Signed)
PHARMACY CONSULT NOTE - INITIAL  Pharmacy Consult for :   Vancomycin Indication:  MRSA Bacteremia  Hospital Problems: Principal Problem:   Sepsis Active Problems:   Type II or unspecified type diabetes mellitus with renal manifestations, not stated as uncontrolled(250.40)   HYPERTENSION   PULMONARY HYPERTENSION   GERD   S/P aortic valve replacement   End stage renal disease   Diarrhea   UTI (lower urinary tract infection)   Allergies: Allergies  Allergen Reactions  . Captopril     REACTION: unspecified  . Enalapril Maleate     REACTION: cough  . Nitrofurantoin     REACTION: itching  . Ramipril     REACTION: unspecified  . Sulfa Antibiotics Other (See Comments)    Reaction unknown  . Verapamil     REACTION: unspecified    Patient Measurements: Height: 5\' 2"  (157.5 cm) Weight: 119 lb 11.4 oz (54.3 kg) IBW/kg (Calculated) : 50.1  Vital Signs: BP 154/64  Pulse 93  Temp(Src) 101 F (38.3 C) (Core (Comment))  Resp 28  Ht 5\' 2"  (1.575 m)  Wt 119 lb 11.4 oz (54.3 kg)  BMI 21.89 kg/m2  SpO2 98%  Labs:  Recent Labs  11/20/12 1339 11/21/12 0507  WBC 13.3* 16.6*  HGB 11.6* 10.2*  PLT 150 104*  CREATININE 6.73* 7.06*   Estimated Creatinine Clearance: 4.2 ml/min (by C-G formula based on Cr of 7.06).   Microbiology: Recent Results (from the past 720 hour(s))  CULTURE, BLOOD (ROUTINE X 2)     Status: None   Collection Time    11/20/12  1:39 PM      Result Value Range Status   Specimen Description BLOOD RIGHT ARM   Final   Special Requests BOTTLES DRAWN AEROBIC AND ANAEROBIC 4CC   Final   Culture  Setup Time     Final   Value: 11/20/2012 17:58     Performed at [facility]   Culture     Final   Value: GRAM POSITIVE COCCI IN CLUSTERS     Note: Gram Stain Report Called to,Read Back By and Verified With: Bonnee Quin RN on 11/21/12 at 03:10 by Christie Nottingham     Performed at [facility]   Report Status PENDING   Incomplete  CULTURE, BLOOD  (ROUTINE X 2)     Status: None   Collection Time    11/20/12  2:19 PM      Result Value Range Status   Specimen Description BLOOD RIGHT HAND   Final   Special Requests BOTTLES DRAWN AEROBIC AND ANAEROBIC 10CC   Final   Culture  Setup Time     Final   Value: 11/20/2012 17:58     Performed at [facility]   Culture     Final   Value: GRAM POSITIVE COCCI IN CLUSTERS     Note: Gram Stain Report Called to,Read Back By and Verified With: Bonnee Quin RN on 11/21/12 at 03:10 by Christie Nottingham     Performed at [facility]   Report Status PENDING   Incomplete  MRSA PCR SCREENING     Status: Abnormal   Collection Time    11/20/12  5:52 PM      Result Value Range Status   MRSA by PCR POSITIVE (*) NEGATIVE Final   Comment:            The GeneXpert MRSA Assay (FDA     approved for NASAL specimens     only),  is one component of a     comprehensive MRSA colonization     surveillance program. It is not     intended to diagnose MRSA     infection nor to guide or     monitor treatment for     MRSA infections.     RESULT CALLED TO, READ BACK BY AND VERIFIED WITH:     GRACOU,R RN 1949 11/20/12 MITCHELL,L  CLOSTRIDIUM DIFFICILE BY PCR     Status: Abnormal   Collection Time    11/20/12  8:27 PM      Result Value Range Status   C difficile by pcr POSITIVE (*) NEGATIVE Final   Comment: CRITICAL RESULT CALLED TO, READ BACK BY AND VERIFIED WITH:     PETTIFORD A RN 11/21/12 0805 COSTELLO B    Medical/Surgical History: Past Medical History  Diagnosis Date  . Anemia     NOS  . Personal history of colonic polyps   . GERD (gastroesophageal reflux disease)   . Hyperlipidemia     takes Simvastatin nightly  . Osteoarthritis   . Osteopenia   . Urinary incontinence   . Pulmonary hypertension   . Anxiety   . Renal insufficiency   . Coronary artery disease   . Aortic stenosis   . Hyperlipidemia   . Hypertension     takes Metoprolol daily  . Peripheral vascular disease   . CHF  (congestive heart failure)   . Migraine     hx of  . Constipation   . Diarrhea   . Oligouria   . History of blood transfusion   . Diabetes mellitus     type II;was on Glipizide but has been off x 6mon  . Hypothyroidism     takes Synthroid daily  . Cancer     Mole on top of head to be removed in July 2013   Past Surgical History  Procedure Laterality Date  . Abdominal hysterectomy    . Tonsillectomy    . Cardiac catheterization  2000    cad  . Replacement total knee bilateral  05/1998  . Cataract extraction    . Coronary artery bypass graft      od  . Adenosine myoview  2007    benign, EF 69%  . Carotid endarterectomy  2011    Right  . Coronary artery bypass graft  2009  . Aortic valve replacement  2009  . Amputation-left great toe  7/12    Dr August Saucer  . Traumatic amputation of right dip joint of index finger    . Insertion of dialysis catheter  12/18/2010    Procedure: INSERTION OF DIALYSIS CATHETER;  Surgeon: Pryor Ochoa, MD;  Location: [facility] OR;  Service: Vascular;  Laterality: Right;  . Av fistula placement  12/22/2010    Procedure: ARTERIOVENOUS (AV) FISTULA CREATION;  Surgeon: Nilda Simmer, MD;  Location: [facility] OR;  Service: Vascular;  Laterality: Left;  Creation of Left Brachial-Cephalic Fistula  . Appendectomy    . Eye surgery      BIlateral  . Amputation  06/18/2011    Procedure: AMPUTATION DIGIT;  Surgeon: Nadara Mustard, MD;  Location: [facility] OR;  Service: Orthopedics;  Laterality: Left;  Left 2nd Toe Amputation at MTP Joint    Medications:  Prescriptions prior to admission  Medication Sig Dispense Refill  . Ascorbic Acid (VITAMIN C) 500 MG CHEW Chew 1 tablet by mouth daily.       Marland Kitchen aspirin 81 MG tablet Take  81 mg by mouth daily.        Marland Kitchen b complex-vitamin c-folic acid (NEPHRO-VITE) 0.8 MG TABS Take 0.8 mg by mouth at bedtime.      . folic acid (FOLVITE) 400 MCG tablet Take 400 mcg by mouth daily.      Marland Kitchen levothyroxine (SYNTHROID, LEVOTHROID) 75 MCG tablet Take  75 mcg by mouth daily before breakfast.      . metoprolol (LOPRESSOR) 50 MG tablet Take 75 mg by mouth 2 (two) times daily.      . Nutritional Supplements (FEEDING SUPPLEMENT, NEPRO CARB STEADY,) LIQD Take 237 mLs by mouth as needed (missed meal during dialysis.).  10 Can  0  . Polyethyl Glycol-Propyl Glycol (SYSTANE) 0.4-0.3 % SOLN Place 1 drop into the right eye 4 (four) times daily.      . pravastatin (PRAVACHOL) 20 MG tablet Take 20 mg by mouth daily.       Scheduled:  . cefTRIAXone (ROCEPHIN)  IV  1 g Intravenous Q24H  . insulin aspart  0-9 Units Subcutaneous Q4H  . levothyroxine  75 mcg Oral QAC breakfast  . vancomycin  500 mg Oral Q6H   And  . metronidazole  500 mg Intravenous Q8H  . sodium chloride  3 mL Intravenous Q12H   Infusions:  . sodium chloride 100 mL/hr at 11/21/12 0800   Anti-infectives   Start     Dose/Rate Route Frequency Ordered Stop   11/20/12 1800  cefTRIAXone (ROCEPHIN) 1 g in dextrose 5 % 50 mL IVPB     1 g 100 mL/hr over 30 Minutes Intravenous Every 24 hours 11/20/12 1756     11/20/12 1800  vancomycin (VANCOCIN) 50 mg/mL oral solution 500 mg     500 mg Oral 4 times per day 11/20/12 1756 12/04/12 1759   11/20/12 1756  metroNIDAZOLE (FLAGYL) IVPB 500 mg     500 mg 100 mL/hr over 60 Minutes Intravenous Every 8 hours 11/20/12 1756 12/04/12 1759   11/20/12 1545  metroNIDAZOLE (FLAGYL) IVPB 500 mg     500 mg 100 mL/hr over 60 Minutes Intravenous  Once 11/20/12 1535 11/20/12 1725   11/20/12 1545  cefTRIAXone (ROCEPHIN) 1 g in dextrose 5 % 50 mL IVPB     1 g 100 mL/hr over 30 Minutes Intravenous  Once 11/20/12 1538 11/20/12 1724      Assessment:  79 y/o female admitted with Sepsis.  Patient on CTX for UTI, Po Vancomycin & IV Flagyl for + C.Diff diarrhea.  Blood cultures report GPC in clusters with MRSA PCR +.  Patient with ESRD will have Vancomycin added for MRSA Bacteremia.  Tc 101 F [core], WBC 16.6.  Goal of Therapy:   Pre-HD levels 15 - 25  mcg/ml Antibiotics selected for infection/cultures and adjusted for renal function.   Plan:   Vancomycin 1250 mg IV x 1 now, then 500 mg IV q HD.   Follow up SCr, UOP, cultures, clinical course and adjust as clinically indicated.  Kimba Lottes, Elisha Headland,  Pharm.D.,    11/17/20149:21 AM

## 2012-11-21 NOTE — Telephone Encounter (Signed)
Yes, I have tried to reach her but noone answered the phone I will try again

## 2012-11-21 NOTE — Consult Note (Signed)
Renal Service Consult Note [facility]  [name] 11/21/2012 [name] Requesting Physician: Dr Sharon Seller  Reason for Consult:  ESRD patient with vomiting and diarrhea HPI: The patient is a 79 y.o. year-old with hx of cabg/avr in 2009, DM2, HTN and ESRD on HD since Dec 2012. Takes HD in [facility] unit, MWF.  Presented with n/v/d for 24 hrs. Reportedly still lives alone and drives. Had HD on Friday but couldn't finish d/t feeling poorly. +LG temp.  Fell at home with bruise to eye. Patient was admitted on 11/16 and started on IV rocephin and flagyl.  Stool studies have returned +for CDI.   Patient is tired and disoriented to place only.  Mild abd pain, no diarrhea currently.    June 2013: left foot toe amp #2, dr duda Dec 2012: VRE UTI, low plts, new start to HD, dm, ams Nov 2012: renal failure with renal bx, low T4 July 2012: mrsa foot infection, ID consult CABG/AVR 2009 Hx bilat tkr Hx CEA   ROS  no sob or cp  no cough  no fever  no jt pain  has access lua no problems, no probs at outpt hd  Past Medical History  Past Medical History  Diagnosis Date  . Anemia     NOS  . Personal history of colonic polyps   . GERD (gastroesophageal reflux disease)   . Hyperlipidemia     takes Simvastatin nightly  . Osteoarthritis   . Osteopenia   . Urinary incontinence   . Pulmonary hypertension   . Anxiety   . Renal insufficiency   . Coronary artery disease   . Aortic stenosis   . Hyperlipidemia   . Hypertension     takes Metoprolol daily  . Peripheral vascular disease   . CHF (congestive heart failure)   . Migraine     hx of  . Constipation   . Diarrhea   . Oligouria   . History of blood transfusion   . Diabetes mellitus     type II;was on Glipizide but has been off x 6mon  . Hypothyroidism     takes Synthroid daily  . Cancer     Mole on top of head to be removed in July 2013   Past Surgical History  Past Surgical History  Procedure  Laterality Date  . Abdominal hysterectomy    . Tonsillectomy    . Cardiac catheterization  2000    cad  . Replacement total knee bilateral  05/1998  . Cataract extraction    . Coronary artery bypass graft      od  . Adenosine myoview  2007    benign, EF 69%  . Carotid endarterectomy  2011    Right  . Coronary artery bypass graft  2009  . Aortic valve replacement  2009  . Amputation-left great toe  7/12    Dr August Saucer  . Traumatic amputation of right dip joint of index finger    . Insertion of dialysis catheter  12/18/2010    Procedure: INSERTION OF DIALYSIS CATHETER;  Surgeon: Pryor Ochoa, MD;  Location: [facility] OR;  Service: Vascular;  Laterality: Right;  . Av fistula placement  12/22/2010    Procedure: ARTERIOVENOUS (AV) FISTULA CREATION;  Surgeon: Nilda Simmer, MD;  Location: [facility] OR;  Service: Vascular;  Laterality: Left;  Creation of Left Brachial-Cephalic Fistula  . Appendectomy    . Eye surgery      BIlateral  . Amputation  06/18/2011  Procedure: AMPUTATION DIGIT;  Surgeon: Nadara Mustard, MD;  Location: [facility] OR;  Service: Orthopedics;  Laterality: Left;  Left 2nd Toe Amputation at MTP Joint   Family History History reviewed. No pertinent family history. Social History  reports that she quit smoking about 69 years ago. Her smoking use included Cigarettes. She smoked 0.00 packs per day for 15 years. She has never used smokeless tobacco. She reports that she drinks alcohol. She reports that she does not use illicit drugs. Allergies  Allergies  Allergen Reactions  . Captopril     REACTION: unspecified  . Enalapril Maleate     REACTION: cough  . Nitrofurantoin     REACTION: itching  . Ramipril     REACTION: unspecified  . Sulfa Antibiotics Other (See Comments)    Reaction unknown  . Verapamil     REACTION: unspecified   Home medications Prior to Admission medications   Medication Sig Start Date End Date Taking? Authorizing Provider  Ascorbic Acid (VITAMIN C) 500 MG  CHEW Chew 1 tablet by mouth daily.    Yes Historical Provider, MD  aspirin 81 MG tablet Take 81 mg by mouth daily.     Yes Historical Provider, MD  b complex-vitamin c-folic acid (NEPHRO-VITE) 0.8 MG TABS Take 0.8 mg by mouth at bedtime.   Yes Historical Provider, MD  folic acid (FOLVITE) 400 MCG tablet Take 400 mcg by mouth daily.   Yes Historical Provider, MD  levothyroxine (SYNTHROID, LEVOTHROID) 75 MCG tablet Take 75 mcg by mouth daily before breakfast.   Yes Historical Provider, MD  metoprolol (LOPRESSOR) 50 MG tablet Take 75 mg by mouth 2 (two) times daily.   Yes Historical Provider, MD  Nutritional Supplements (FEEDING SUPPLEMENT, NEPRO CARB STEADY,) LIQD Take 237 mLs by mouth as needed (missed meal during dialysis.). 12/29/10  Yes [name], MD  Polyethyl Glycol-Propyl Glycol (SYSTANE) 0.4-0.3 % SOLN Place 1 drop into the right eye 4 (four) times daily.   Yes Historical Provider, MD  pravastatin (PRAVACHOL) 20 MG tablet Take 20 mg by mouth daily.   Yes Historical Provider, MD   Liver Function Tests  Recent Labs Lab 11/20/12 1339  AST 78*  ALT 42*  ALKPHOS 133*  BILITOT 0.7  PROT 8.3  ALBUMIN 3.5    Recent Labs Lab 11/20/12 1339  LIPASE 15   CBC  Recent Labs Lab 11/20/12 1339 11/21/12 0507  WBC 13.3* 16.6*  NEUTROABS 12.4*  --   HGB 11.6* 10.2*  HCT 34.8* 30.6*  MCV 101.8* 101.7*  PLT 150 104*   Basic Metabolic Panel  Recent Labs Lab 11/20/12 1339 11/21/12 0507  NA 130* 132*  K 4.7 4.3  CL 86* 93*  CO2 20 18*  GLUCOSE 206* 116*  BUN 76* 84*  CREATININE 6.73* 7.06*  CALCIUM 9.8 8.8    Exam  Blood pressure 154/64, pulse 93, temperature 101 F (38.3 C), temperature source Core (Comment), resp. rate 28, height 5\' 2"  (1.575 m), weight 54.3 kg (119 lb 11.4 oz), SpO2 98.00%.  gen: frail elderly female, no distress, oriented, very weak  skin: no rash, cyanosis  heent: eomi, sclera anicteric, throat dry  neck: + jvd to angel of jaw, no LAN  chest:  clear to bases bilat  cor: regular, soft SEM, no rub or gallop  abd: soft, mildly tender diffusely, +bs, no hsm  ext: trace bilat pretibial edema , no joint effusion, no gangrene/ulcers  neuro: alert, ox2, nf  Dialysis orders: MWF at [facility]  50.5kg  2K/2.25Ca   400/800    L arm AVF    Heparin 4200 Hectorol 4ug     Epo 1800     Venofer none  Assessment/Plan: 1. Diarrhea / +Cdif- per primary 2. ESRD- plan HD today 3. Anemia of ckd- Hb 10.6, op tsat 40%, cont esa with darbe 25 on wed 4. MBD (metabolic bone disease)- last P 5.6, pth 216, cont vit D, check phos 5. HTN/volume- bp up w leg edema, 3-4kg over dry wt, uf with hd today 6. DM2 7. Hx CABG/AVR 2009    Vinson Moselle MD  pager [phone]    cell [phone]  11/21/2012, 10:19 AM

## 2012-11-21 NOTE — Telephone Encounter (Signed)
Called son with x-ray results and pt is now at [facility] with UTI, possible C-diff and flu.

## 2012-11-21 NOTE — Progress Notes (Signed)
To Hemodialysis  

## 2012-11-21 NOTE — Procedures (Signed)
I was present at this dialysis session, have reviewed the session itself and made  appropriate changes   Rob Maurianna Benard MD  pager [phone]    cell [phone]  11/21/2012, 5:06 PM   

## 2012-11-21 NOTE — Progress Notes (Signed)
Patient is positive for C dificile, informed primary nurse of this

## 2012-11-22 DIAGNOSIS — I359 Nonrheumatic aortic valve disorder, unspecified: Secondary | ICD-10-CM

## 2012-11-22 DIAGNOSIS — R7881 Bacteremia: Secondary | ICD-10-CM

## 2012-11-22 DIAGNOSIS — I369 Nonrheumatic tricuspid valve disorder, unspecified: Secondary | ICD-10-CM

## 2012-11-22 DIAGNOSIS — A4101 Sepsis due to Methicillin susceptible Staphylococcus aureus: Secondary | ICD-10-CM

## 2012-11-22 DIAGNOSIS — N186 End stage renal disease: Secondary | ICD-10-CM

## 2012-11-22 DIAGNOSIS — A0472 Enterocolitis due to Clostridium difficile, not specified as recurrent: Secondary | ICD-10-CM

## 2012-11-22 DIAGNOSIS — A4901 Methicillin susceptible Staphylococcus aureus infection, unspecified site: Secondary | ICD-10-CM

## 2012-11-22 DIAGNOSIS — A419 Sepsis, unspecified organism: Secondary | ICD-10-CM

## 2012-11-22 LAB — COMPREHENSIVE METABOLIC PANEL
ALT: 69 U/L — ABNORMAL HIGH (ref 0–35)
Albumin: 2.3 g/dL — ABNORMAL LOW (ref 3.5–5.2)
Alkaline Phosphatase: 102 U/L (ref 39–117)
CO2: 23 mEq/L (ref 19–32)
Calcium: 8.5 mg/dL (ref 8.4–10.5)
Creatinine, Ser: 3.76 mg/dL — ABNORMAL HIGH (ref 0.50–1.10)
GFR calc Af Amer: 11 mL/min — ABNORMAL LOW (ref >90)
GFR calc non Af Amer: 10 mL/min — ABNORMAL LOW (ref >90)
Glucose, Bld: 101 mg/dL — ABNORMAL HIGH (ref 70–99)
Potassium: 3.3 mEq/L — ABNORMAL LOW (ref 3.5–5.1)
Sodium: 133 mEq/L — ABNORMAL LOW (ref 135–145)
Total Bilirubin: 0.3 mg/dL (ref 0.3–1.2)
Total Protein: 6.1 g/dL (ref 6.0–8.3)

## 2012-11-22 LAB — CBC
Hemoglobin: 9.9 g/dL — ABNORMAL LOW (ref 12.0–15.0)
MCH: 34.4 pg — ABNORMAL HIGH (ref 26.0–34.0)
MCHC: 34.4 g/dL (ref 30.0–36.0)
MCV: 100 fL (ref 78.0–100.0)
RBC: 2.88 MIL/uL — ABNORMAL LOW (ref 3.87–5.11)
RDW: 14.1 % (ref 11.5–15.5)
WBC: 11.2 10*3/uL — ABNORMAL HIGH (ref 4.0–10.5)

## 2012-11-22 LAB — GLUCOSE, CAPILLARY: Glucose-Capillary: 88 mg/dL (ref 70–99)

## 2012-11-22 MED ORDER — VANCOMYCIN HCL IN DEXTROSE 1-5 GM/200ML-% IV SOLN
1000.0000 mg | INTRAVENOUS | Status: AC
  Administered 2012-11-22: 1000 mg via INTRAVENOUS
  Filled 2012-11-22: qty 200

## 2012-11-22 MED ORDER — MUPIROCIN 2 % EX OINT
1.0000 "application " | TOPICAL_OINTMENT | Freq: Two times a day (BID) | CUTANEOUS | Status: AC
  Administered 2012-11-22 – 2012-11-26 (×10): 1 via NASAL
  Filled 2012-11-22: qty 22

## 2012-11-22 MED ORDER — CEFAZOLIN SODIUM-DEXTROSE 2-3 GM-% IV SOLR
2.0000 g | INTRAVENOUS | Status: AC
  Administered 2012-11-22: 2 g via INTRAVENOUS
  Filled 2012-11-22: qty 50

## 2012-11-22 MED ORDER — GENTAMICIN IN SALINE 1.6-0.9 MG/ML-% IV SOLN
80.0000 mg | INTRAVENOUS | Status: AC
  Administered 2012-11-22: 80 mg via INTRAVENOUS
  Filled 2012-11-22: qty 50

## 2012-11-22 MED ORDER — GENTAMICIN IN SALINE 1.2-0.9 MG/ML-% IV SOLN
60.0000 mg | INTRAVENOUS | Status: DC
  Administered 2012-11-23 – 2012-11-27 (×3): 60 mg via INTRAVENOUS
  Filled 2012-11-22 (×5): qty 50

## 2012-11-22 MED ORDER — CEFAZOLIN SODIUM 1-5 GM-% IV SOLN: 1.0000 g | INTRAVENOUS | Status: DC

## 2012-11-22 MED ORDER — SODIUM CHLORIDE 0.9 % IV SOLN
300.0000 mg | Freq: Three times a day (TID) | INTRAVENOUS | Status: DC
  Administered 2012-11-22 – 2012-11-29 (×20): 300 mg via INTRAVENOUS
  Filled 2012-11-22 (×26): qty 300

## 2012-11-22 MED ORDER — CEFAZOLIN SODIUM-DEXTROSE 2-3 GM-% IV SOLR
2.0000 g | INTRAVENOUS | Status: DC
  Administered 2012-11-23 – 2012-11-25 (×2): 2 g via INTRAVENOUS
  Filled 2012-11-22 (×2): qty 50

## 2012-11-22 MED ORDER — CHLORHEXIDINE GLUCONATE CLOTH 2 % EX PADS
6.0000 | MEDICATED_PAD | Freq: Every day | CUTANEOUS | Status: AC
  Administered 2012-11-23 – 2012-11-27 (×4): 6 via TOPICAL

## 2012-11-22 MED ORDER — HYDROCODONE-ACETAMINOPHEN 5-325 MG PO TABS
1.0000 | ORAL_TABLET | Freq: Four times a day (QID) | ORAL | Status: DC | PRN
  Administered 2012-11-22 – 2012-12-04 (×2): 1 via ORAL
  Filled 2012-11-22 (×2): qty 1

## 2012-11-22 NOTE — Consult Note (Signed)
PHARMACY NOTE  Pharmacy Consult for :  Gentamicin Indication:  Prosthetic valve infection  Hospital Problems Principal Problem:   Sepsis Active Problems:   Type II or unspecified type diabetes mellitus with renal manifestations, not stated as uncontrolled(250.40)   HYPERTENSION   PULMONARY HYPERTENSION   GERD   S/P aortic valve replacement   End stage renal disease   Diarrhea   UTI (lower urinary tract infection)   Weight: 53 kg  Vitals: BP 134/49  Pulse 75  Temp(Src) 97.8 F (36.6 C) (Oral)  Resp 33  Ht 5\' 2"  (1.575 m)  Wt 117 lb 4.6 oz (53.2 kg)  BMI 21.45 kg/m2  SpO2 100%  Labs:  Recent Labs  11/20/12 1339 11/21/12 0507 11/22/12 0720  WBC 13.3* 16.6* 11.2*  HGB 11.6* 10.2* 9.9*  PLT 150 104* 98*  CREATININE 6.73* 7.06* 3.76*   Estimated Creatinine Clearance: 7.9 ml/min (by C-G formula based on Cr of 3.76).   Microbiology: Recent Results (from the past 720 hour(s))  CULTURE, BLOOD (ROUTINE X 2)     Status: None   Collection Time    11/20/12  1:39 PM      Result Value Range Status   Specimen Description BLOOD RIGHT ARM   Final   Special Requests BOTTLES DRAWN AEROBIC AND ANAEROBIC 4CC   Final   Culture  Setup Time     Final   Value: 11/20/2012 17:58     Performed at [facility]   Culture     Final   Value: STAPHYLOCOCCUS AUREUS     Note: RIFAMPIN AND GENTAMICIN SHOULD NOT BE USED AS SINGLE DRUGS FOR TREATMENT OF STAPH INFECTIONS.     Note: Gram Stain Report Called to,Read Back By and Verified With: Bonnee Quin RN on 11/21/12 at 03:10 by Christie Nottingham     Performed at [facility]   Report Status PENDING   Incomplete  CULTURE, BLOOD (ROUTINE X 2)     Status: None   Collection Time    11/20/12  2:19 PM      Result Value Range Status   Specimen Description BLOOD RIGHT HAND   Final   Special Requests BOTTLES DRAWN AEROBIC AND ANAEROBIC 10CC   Final   Culture  Setup Time     Final   Value: 11/20/2012 17:58     Performed  at [facility]   Culture     Final   Value: STAPHYLOCOCCUS AUREUS     Note: Gram Stain Report Called to,Read Back By and Verified With: Bonnee Quin RN on 11/21/12 at 03:10 by Christie Nottingham     Performed at [facility]   Report Status PENDING   Incomplete  URINE CULTURE     Status: None   Collection Time    11/20/12  3:05 PM      Result Value Range Status   Specimen Description URINE, CATHETERIZED   Final   Special Requests NONE   Final   Culture  Setup Time     Final   Value: 11/20/2012 20:00     Performed at [facility]   Culture     Final   Value: 50,000 COLONIES/mL LACTOBACILLUS SPECIES     Note: Standardized susceptibility testing for this organism is not available.     Performed at [facility]   Report Status 11/21/2012 FINAL   Final  MRSA PCR SCREENING     Status: Abnormal   Collection Time    11/20/12  5:52 PM      Result Value Range Status   MRSA by PCR POSITIVE (*) NEGATIVE Final   Comment:            The GeneXpert MRSA Assay (FDA     approved for NASAL specimens     only), is one component of a     comprehensive MRSA colonization     surveillance program. It is not     intended to diagnose MRSA     infection nor to guide or     monitor treatment for     MRSA infections.     RESULT CALLED TO, READ BACK BY AND VERIFIED WITH:     GRACOU,R RN 1949 11/20/12 MITCHELL,L  CLOSTRIDIUM DIFFICILE BY PCR     Status: Abnormal   Collection Time    11/20/12  8:27 PM      Result Value Range Status   C difficile by pcr POSITIVE (*) NEGATIVE Final   Comment: CRITICAL RESULT CALLED TO, READ BACK BY AND VERIFIED WITH:     PETTIFORD A RN 11/21/12 0805 COSTELLO B    Anti-infectives Anti-infectives   Start     Dose/Rate Route Frequency Ordered Stop   11/23/12 2000  ceFAZolin (ANCEF) IVPB 1 g/50 mL premix  Status:  Discontinued     1 g 100 mL/hr over 30 Minutes Intravenous Every M-W-F (2000) 11/22/12 1138 11/22/12 1139   11/23/12 2000  ceFAZolin  (ANCEF) IVPB 2 g/50 mL premix     2 g 100 mL/hr over 30 Minutes Intravenous Every M-W-F (2000) 11/22/12 1139     11/22/12 1500  rifampin (RIFADIN) 300 mg in sodium chloride 0.9 % 100 mL IVPB     300 mg 200 mL/hr over 30 Minutes Intravenous 3 times per day 11/22/12 1359     11/22/12 1200  vancomycin (VANCOCIN) IVPB 1000 mg/200 mL premix     1,000 mg 200 mL/hr over 60 Minutes Intravenous STAT 11/22/12 1114 11/22/12 1347   11/22/12 1200  ceFAZolin (ANCEF) IVPB 2 g/50 mL premix     2 g 100 mL/hr over 30 Minutes Intravenous STAT 11/22/12 1138 11/22/12 1316   11/21/12 1200  vancomycin (VANCOCIN) 500 mg in sodium chloride 0.9 % 100 mL IVPB     500 mg 100 mL/hr over 60 Minutes Intravenous Every M-W-F (Hemodialysis) 11/21/12 0928     11/21/12 0930  vancomycin (VANCOCIN) 1,250 mg in sodium chloride 0.9 % 250 mL IVPB     1,250 mg 166.7 mL/hr over 90 Minutes Intravenous STAT 11/21/12 0927 11/22/12 0930   11/20/12 1800  cefTRIAXone (ROCEPHIN) 1 g in dextrose 5 % 50 mL IVPB  Status:  Discontinued     1 g 100 mL/hr over 30 Minutes Intravenous Every 24 hours 11/20/12 1756 11/22/12 1120   11/20/12 1800  vancomycin (VANCOCIN) 50 mg/mL oral solution 500 mg     500 mg Oral 4 times per day 11/20/12 1756 12/04/12 1759   11/20/12 1756  metroNIDAZOLE (FLAGYL) IVPB 500 mg  Status:  Discontinued     500 mg 100 mL/hr over 60 Minutes Intravenous Every 8 hours 11/20/12 1756 11/22/12 1400   11/20/12 1545  metroNIDAZOLE (FLAGYL) IVPB 500 mg     500 mg 100 mL/hr over 60 Minutes Intravenous  Once 11/20/12 1535 11/20/12 1725   11/20/12 1545  cefTRIAXone (ROCEPHIN) 1 g in dextrose 5 % 50 mL IVPB     1 g 100 mL/hr over 30 Minutes Intravenous  Once 11/20/12 1538 11/20/12  1724      Assessment:  79 y/o female on Vancomycin, Cefazolin, and Rifampin for Staph aureus bacteremia will now be placed on Gentamicin for prosthetic valve infection synergy.  Patient is on HD q MWF.  For Gentamicin, Vd 0.4 L/kg, Wt ~ 50 kg.   ESRD.  Goal of Therapy:   Gentamicin peak levels ~ 4 mcg/ml.  Plan:  1. Gentamicin 80 mg IV now, then  2. Gentamicin 60 mg IV q post-HD session. 3. Gentamicin levels as clinically indicated.  Laurena Bering, Pharm.D.  11/22/2012 3:15 PM

## 2012-11-22 NOTE — Progress Notes (Signed)
  [facility] Progress Note    Subjective: Blood cx's 2/2 +staph aureus, UCx +50k lactobacillus. Pt w/o complaints, feels "lousy". 1.8kg off with HD yest, wt up to 53kg today. More alert today   Exam  Blood pressure 155/53, pulse 81, temperature 98.1 F (36.7 C), temperature source Oral, resp. rate 24, height 5\' 2"  (1.575 m), weight 53.2 kg (117 lb 4.6 oz), SpO2 100.00%. gen: frail elderly female, no distress, oriented, very weak, periorbital ecchymosis R eye, more alert and fully oriented today neck: + jvd to angel of jaw, no LAN  chest: clear to bases bilat  cor: regular, soft SEM, no rub or gallop  abd: soft, mildly tender diffusely, +bs, no hsm  ext: trace bilat pretibial edema  neuro: alert, ox3, nf  Dialysis orders: MWF at [facility]  3hrs   50.5kg    2K/2.25Ca   400/800   L arm AVF    Heparin 4200  Hectorol 4ug    Epo 1800    Venofer none   Assessment/Plan:  1. Diarrhea / +Cdif: per primary 2. Staph aureus bacteremia: on rocephin and IV vanc, per primary 3. ESRD: HD wed 4. Anemia of CKD: Hb 9.9, op tsat 40%, cont esa w darbe 25 on wed 5. MBD (metabolic bone disease)- P 5.5, op pth 216, cont vit D, check phos 6. HTN/volume- leg edema, 3kg up, UF to dry wt in am 7. DM2 8. CABG/AVR 2009   Vinson Moselle MD  pager [phone]    cell [phone]  11/22/2012, 11:08 AM   Recent Labs Lab 11/20/12 1339 11/21/12 0507 11/22/12 0720  NA 130* 132* 133*  K 4.7 4.3 3.3*  CL 86* 93* 94*  CO2 20 18* 23  GLUCOSE 206* 116* 101*  BUN 76* 84* 35*  CREATININE 6.73* 7.06* 3.76*  CALCIUM 9.8 8.8 8.5  PHOS  --  5.5*  --     Recent Labs Lab 11/20/12 1339 11/22/12 0720  AST 78* 123*  ALT 42* 69*  ALKPHOS 133* 102  BILITOT 0.7 0.3  PROT 8.3 6.1  ALBUMIN 3.5 2.3*    Recent Labs Lab 11/20/12 1339 11/21/12 0507 11/22/12 0720  WBC 13.3* 16.6* 11.2*  NEUTROABS 12.4*  --   --   HGB 11.6* 10.2* 9.9*  HCT 34.8* 30.6* 28.8*  MCV 101.8* 101.7* 100.0  PLT 150 104*  98*   . aspirin EC  81 mg Oral Daily  . cefTRIAXone (ROCEPHIN)  IV  1 g Intravenous Q24H  . darbepoetin  25 mcg Intravenous Q Mon-HD  . doxercalciferol  4 mcg Intravenous Q M,W,F-HD  . folic acid  0.5 mg Oral Daily  . insulin aspart  0-9 Units Subcutaneous TID WC  . levothyroxine  75 mcg Oral QAC breakfast  . metoprolol  75 mg Oral BID  . vancomycin  500 mg Oral Q6H   And  . metronidazole  500 mg Intravenous Q8H  . multivitamin  1 tablet Oral QHS  . sodium chloride  3 mL Intravenous Q12H  . vancomycin (VANCOCIN) 500 mg IVPB  500 mg Intravenous Q M,W,F-HD  . vitamin C  500 mg Oral Daily   . sodium chloride 10 mL/hr (11/21/12 2245)   acetaminophen, acetaminophen, hydrALAZINE

## 2012-11-22 NOTE — Progress Notes (Signed)
Echocardiogram 2D Echocardiogram has been performed.  [name] 11/22/2012, 4:52 PM

## 2012-11-22 NOTE — Progress Notes (Signed)
[facility] Progress Note [place] TEAM 1 - Stepdown/ICU TEAM   KAYLOR MAIERS ZOX:[id number] DOB: May 22, 1920 DOA: 11/20/2012 PCP: Tillman Abide, MD  Admit HPI / Brief Narrative: 79 y.o. female with past medical history of end-stage renal disease, diabetes mellitus, CAD and C. difficile in the past who despite her advanced age and medical problems is rather functional and lives alone with her son nearby. Patient had been in her usual state of health when she complained to her son about chills 3 days prior to admit. She went to dialysis on Friday and had a low-grade temp. Dialysis was done partially on that day. Patient visited her PCP and a chest x-ray was unremarkable. She spent the weekend with her son, and was brought to the ED when she was noted to be progressively weaker with diarrhea.  In the emergency room the patient was noted to have a lactic acid level 11.9, anion gap of 24 and a glucose of 206. Her white count was 13.2-92% shift and her urine was positive for UTI. Stool studies were sent for C. difficile as was urine culture and patient was started on IV Rocephin and Flagyl.   Assessment/Plan:  Sepsis Due to UTI, gr positive bacteremia and C diff - sepsis physiology is improving  - ID managing antiobiotics  Gram + cocci bacteremia - 2/2 blood cx + Cont empiric abx - f/u speciation and sensitivities to determine if SBE eval will be needed - TTE performed today - as mentioned above, ID managing  C diff colitis - Diarrhea  Oral flagyl + oral vanc - follow clinically - avoid PPI - will require prolonged tx due to need for concomitant abx   UTI -Lactobacillus species- < 50, 000 colonies  ESRD on HD  Nephrology is following- dialysis access (fistula) does not appear to be infected however per Dr Zenaida Niece Dam's suggestion, will consult vascular to evaluate it.   Anemia of CKD Follow hemoglobin trend - no evidence of acute blood loss  CAD w/ Hx CABG Denies chest  pain  DM2 CBG reasonably controlled at the present time - follow   HTN Cont to follow  Pulmonary HTN  GERD   S/P AoVR Does not require chronic anticoagulation due to use of bioprosthetic valve-at risk for endocarditis  Code Status: NO CODE BLUE Family Communication: d/w 2 neices at bedside Disposition Plan: SDU  Consultants: Nephrology   Procedures: none  Antibiotics: Rocephin 11/16 >> Vanc 11/16 >> Flagyl 11/16 >>  DVT prophylaxis: SCDs  HPI/Subjective: The patient is alert- hard of hearing- worrying about her infections- trying to take clears but has not appetite and has mild nausea- diarrhea has resolved. No pain in fistula site. Does mention that one specific RN who would perform her dialysis would often not clean her fistula before accessing it. No trouble with Nose- had surgery on it about 2 wks ago.   Objective: Blood pressure 134/49, pulse 75, temperature 97.8 F (36.6 C), temperature source Oral, resp. rate 33, height 5\' 2"  (1.575 m), weight 53.2 kg (117 lb 4.6 oz), SpO2 100.00%.  Intake/Output Summary (Last 24 hours) at 11/22/12 1730 Last data filed at 11/22/12 1247  Gross per 24 hour  Intake 2236.83 ml  Output   1839 ml  Net 397.83 ml   Exam: General: No acute respiratory distress Lungs: Clear to auscultation bilaterally without wheezes or crackles Cardiovascular: Regular rate and rhythm without gallop or rub normal S1 and S2 Abdomen: Nontender, nondistended, soft, bowel sounds positive, no rebound,  no ascites, no appreciable mass Extremities: No significant cyanosis, clubbing, or edema bilateral lower extremities  Data Reviewed: Basic Metabolic Panel:  Recent Labs Lab 11/20/12 1339 11/21/12 0507 11/22/12 0720  NA 130* 132* 133*  K 4.7 4.3 3.3*  CL 86* 93* 94*  CO2 20 18* 23  GLUCOSE 206* 116* 101*  BUN 76* 84* 35*  CREATININE 6.73* 7.06* 3.76*  CALCIUM 9.8 8.8 8.5  PHOS  --  5.5*  --    Liver Function Tests:  Recent Labs Lab  11/20/12 1339 11/22/12 0720  AST 78* 123*  ALT 42* 69*  ALKPHOS 133* 102  BILITOT 0.7 0.3  PROT 8.3 6.1  ALBUMIN 3.5 2.3*    Recent Labs Lab 11/20/12 1339  LIPASE 15   CBC:  Recent Labs Lab 11/20/12 1339 11/21/12 0507 11/22/12 0720  WBC 13.3* 16.6* 11.2*  NEUTROABS 12.4*  --   --   HGB 11.6* 10.2* 9.9*  HCT 34.8* 30.6* 28.8*  MCV 101.8* 101.7* 100.0  PLT 150 104* 98*   CBG:  Recent Labs Lab 11/21/12 1154 11/21/12 1742 11/21/12 2101 11/22/12 0857 11/22/12 1256  GLUCAP 117* 81 82 88 173*    Recent Results (from the past 240 hour(s))  CULTURE, BLOOD (ROUTINE X 2)     Status: None   Collection Time    11/20/12  1:39 PM      Result Value Range Status   Specimen Description BLOOD RIGHT ARM   Final   Special Requests BOTTLES DRAWN AEROBIC AND ANAEROBIC 4CC   Final   Culture  Setup Time     Final   Value: 11/20/2012 17:58     Performed at [facility]   Culture     Final   Value: STAPHYLOCOCCUS AUREUS     Note: RIFAMPIN AND GENTAMICIN SHOULD NOT BE USED AS SINGLE DRUGS FOR TREATMENT OF STAPH INFECTIONS.     Note: Gram Stain Report Called to,Read Back By and Verified With: Bonnee Quin RN on 11/21/12 at 03:10 by Christie Nottingham     Performed at [facility]   Report Status PENDING   Incomplete  CULTURE, BLOOD (ROUTINE X 2)     Status: None   Collection Time    11/20/12  2:19 PM      Result Value Range Status   Specimen Description BLOOD RIGHT HAND   Final   Special Requests BOTTLES DRAWN AEROBIC AND ANAEROBIC 10CC   Final   Culture  Setup Time     Final   Value: 11/20/2012 17:58     Performed at [facility]   Culture     Final   Value: STAPHYLOCOCCUS AUREUS     Note: Gram Stain Report Called to,Read Back By and Verified With: Bonnee Quin RN on 11/21/12 at 03:10 by Christie Nottingham     Performed at [facility]   Report Status PENDING   Incomplete  URINE CULTURE     Status: None   Collection Time    11/20/12  3:05 PM      Result  Value Range Status   Specimen Description URINE, CATHETERIZED   Final   Special Requests NONE   Final   Culture  Setup Time     Final   Value: 11/20/2012 20:00     Performed at [facility]   Culture     Final   Value: 50,000 COLONIES/mL LACTOBACILLUS SPECIES     Note: Standardized susceptibility testing for this organism is not  available.     Performed at [facility]   Report Status 11/21/2012 FINAL   Final  MRSA PCR SCREENING     Status: Abnormal   Collection Time    11/20/12  5:52 PM      Result Value Range Status   MRSA by PCR POSITIVE (*) NEGATIVE Final   Comment:            The GeneXpert MRSA Assay (FDA     approved for NASAL specimens     only), is one component of a     comprehensive MRSA colonization     surveillance program. It is not     intended to diagnose MRSA     infection nor to guide or     monitor treatment for     MRSA infections.     RESULT CALLED TO, READ BACK BY AND VERIFIED WITH:     GRACOU,R RN 1949 11/20/12 MITCHELL,L  CLOSTRIDIUM DIFFICILE BY PCR     Status: Abnormal   Collection Time    11/20/12  8:27 PM      Result Value Range Status   C difficile by pcr POSITIVE (*) NEGATIVE Final   Comment: CRITICAL RESULT CALLED TO, READ BACK BY AND VERIFIED WITH:     PETTIFORD A RN 11/21/12 0805 COSTELLO B     Studies:  Recent x-ray studies have been reviewed in detail by the Attending Physician  Scheduled Meds:  Scheduled Meds: . aspirin EC  81 mg Oral Daily  . [START ON 11/23/2012]  ceFAZolin (ANCEF) IV  2 g Intravenous Q M,W,F-2000  . [START ON 11/23/2012] Chlorhexidine Gluconate Cloth  6 each Topical Q0600  . darbepoetin  25 mcg Intravenous Q Mon-HD  . doxercalciferol  4 mcg Intravenous Q M,W,F-HD  . folic acid  0.5 mg Oral Daily  . [START ON 11/23/2012] gentamicin  60 mg Intravenous Q M,W,F-HD  . insulin aspart  0-9 Units Subcutaneous TID WC  . levothyroxine  75 mcg Oral QAC breakfast  . metoprolol  75 mg Oral BID  .  multivitamin  1 tablet Oral QHS  . mupirocin ointment  1 application Nasal BID  . rifampin (RIFADIN) IVPB  300 mg Intravenous Q8H  . sodium chloride  3 mL Intravenous Q12H  . vancomycin  500 mg Oral Q6H  . vancomycin (VANCOCIN) 500 mg IVPB  500 mg Intravenous Q M,W,F-HD  . vitamin C  500 mg Oral Daily    Time spent on care of this patient: 35 mins   Calvert Cantor, MD  [facility] Office  [phone] Pager - Text Page per Loretha Stapler as per below:  On-Call/Text Page:      [email or web address]      password [email or web address]  If 7PM-7AM, please contact night-coverage [email or web address] Password [email or web address] 11/22/2012, 5:30 PM   LOS: 2 days

## 2012-11-22 NOTE — Consult Note (Signed)
[facility] [place]  Patient name: [name] MRN: [id number] DOB: 1920-09-05 Sex: female  REASON FOR CONSULT: Evaluate A-V fistula as a source for sepsis.  HPI: [name] is a 79 y.o. female who was admitted on 11/20/2012 with vomiting and diarrhea. She also had complained of chills for 3 days. The patient was admitted with sepsis. She has undergone a previous aortic valve replacement. She also has end-stage renal disease and is on dialysis via a left brachial cephalic AV fistula which was placed on 12/22/2010. She has had a positive C. Difficile. In addition she had a blood culture with staph aureus. She is being worked up for endocarditis. She is currently on vancomycin and Ancef his own. Gentamicin and rifampin have also been added. Her fistula has been working well for hemodialysis. She dialyzes on Monday Wednesdays and Fridays and dialyzed yesterday without any problem. She denies pain or paresthesias in her left arm.  Past Medical History  Diagnosis Date  . Anemia     NOS  . Personal history of colonic polyps   . GERD (gastroesophageal reflux disease)   . Hyperlipidemia     takes Simvastatin nightly  . Osteoarthritis   . Osteopenia   . Urinary incontinence   . Pulmonary hypertension   . Anxiety   . Renal insufficiency   . Coronary artery disease   . Aortic stenosis   . Hyperlipidemia   . Hypertension     takes Metoprolol daily  . Peripheral vascular disease   . CHF (congestive heart failure)   . Migraine     hx of  . Constipation   . Diarrhea   . Oligouria   . History of blood transfusion   . Diabetes mellitus     type II;was on Glipizide but has been off x 6mon  . Hypothyroidism     takes Synthroid daily  . Cancer     Mole on top of head to be removed in July 2013   SOCIAL HISTORY: History  Substance Use Topics  . Smoking status: Former Smoker -- 15 years    Types: Cigarettes    Quit date: 01/06/1943  . Smokeless tobacco: Never  Used  . Alcohol Use: Yes     Comment: Wine occasionally   REVIEW OF SYSTEMS: Arly.Keller ] denotes positive finding; [  ] denotes negative finding CARDIOVASCULAR:  [ ]  chest pain   [ ]  chest pressure   [ ]  palpitations   [ ]  orthopnea  PULMONARY:   [ ]  productive cough   [ ]  asthma   [ ]  wheezing INTEGUMENTARY:  [ ]  rashes  [ ]  ulcers CONSTITUTIONAL:  Arly.Keller ] fever   Arly.Keller ] chills  PHYSICAL EXAM: Filed Vitals:   11/22/12 1100 11/22/12 1200 11/22/12 1213 11/22/12 1300  BP: 150/52 132/50 132/50 134/49  Pulse: 79 71 72 75  Temp:   97.8 F (36.6 C)   TempSrc:   Oral   Resp: 32 21 22 33  Height:      Weight:      SpO2: 100% 100% 100% 100%   Body mass index is 21.45 kg/(m^2). GENERAL: The patient is a well-nourished female, in no acute distress. The vital signs are documented above. CARDIOVASCULAR: There is a regular rate and rhythm.  PULMONARY: There is good air exchange bilaterally without wheezing or rales. Her left upper arm fistula has an excellent bruit and thrill. There is no cellulitis overlying the fistula. There are no  aneurysms. There are no ulcers. She has a palpable left radial pulse. PSYCHIATRIC: The patient has a normal affect.  DATA:  Lab Results  Component Value Date   WBC 11.2* 11/22/2012   HGB 9.9* 11/22/2012   HCT 28.8* 11/22/2012   MCV 100.0 11/22/2012   PLT 98* 11/22/2012   Lab Results  Component Value Date   NA 133* 11/22/2012   K 3.3* 11/22/2012   CL 94* 11/22/2012   CO2 23 11/22/2012   Lab Results  Component Value Date   CREATININE 3.76* 11/22/2012   Lab Results  Component Value Date   INR 1.04 06/18/2011   INR 1.08 12/16/2010   INR 1.04 12/13/2010   Lab Results  Component Value Date   HGBA1C 6.2* 11/20/2012   CBG (last 3)   Recent Labs  11/21/12 2101 11/22/12 0857 11/22/12 1256  GLUCAP 82 88 173*   MEDICAL ISSUES: This patient has a functioning left upper arm AV fistula which at this point does not appear to be a source of sepsis. There is no  erythema, ulcers or aneurysms noted. We will follow peripherally.  DICKSON,[name] S [facility] [place] Beeper: [phone]

## 2012-11-22 NOTE — Progress Notes (Signed)
ANTIBIOTIC CONSULT NOTE - FOLLOW UP  Pharmacy Consult for Vancomycin Indication: 2/2 BCx growing GPC in clusters  Allergies  Allergen Reactions  . Captopril     REACTION: unspecified  . Enalapril Maleate     REACTION: cough  . Nitrofurantoin     REACTION: itching  . Ramipril     REACTION: unspecified  . Sulfa Antibiotics Other (See Comments)    Reaction unknown  . Verapamil     REACTION: unspecified    Patient Measurements: Height: 5\' 2"  (157.5 cm) Weight: 117 lb 4.6 oz (53.2 kg) IBW/kg (Calculated) : 50.1  Vital Signs: Temp: 98.1 F (36.7 C) (11/18 0800) Temp src: Oral (11/18 0800) BP: 155/53 mmHg (11/18 1032) Pulse Rate: 81 (11/18 1032) Intake/Output from previous day: 11/17 0701 - 11/18 0700 In: 1983.8 [P.O.:120; I.V.:1413.8; IV Piggyback:450] Out: 1839  Intake/Output from this shift: Total I/O In: 3 [I.V.:3] Out: -   Labs:  Recent Labs  11/20/12 1339 11/21/12 0507 11/22/12 0720  WBC 13.3* 16.6* 11.2*  HGB 11.6* 10.2* 9.9*  PLT 150 104* 98*  CREATININE 6.73* 7.06* 3.76*   Estimated Creatinine Clearance: 7.9 ml/min (by C-G formula based on Cr of 3.76). No results found for this basename: VANCOTROUGH, Leodis Binet, VANCORANDOM, GENTTROUGH, GENTPEAK, GENTRANDOM, TOBRATROUGH, TOBRAPEAK, TOBRARND, AMIKACINPEAK, AMIKACINTROU, AMIKACIN,  in the last 72 hours   Microbiology: Recent Results (from the past 720 hour(s))  CULTURE, BLOOD (ROUTINE X 2)     Status: None   Collection Time    11/20/12  1:39 PM      Result Value Range Status   Specimen Description BLOOD RIGHT ARM   Final   Special Requests BOTTLES DRAWN AEROBIC AND ANAEROBIC 4CC   Final   Culture  Setup Time     Final   Value: 11/20/2012 17:58     Performed at [facility]   Culture     Final   Value: GRAM POSITIVE COCCI IN CLUSTERS     Note: Gram Stain Report Called to,Read Back By and Verified With: Bonnee Quin RN on 11/21/12 at 03:10 by Christie Nottingham     Performed at [facility]   Report Status PENDING   Incomplete  CULTURE, BLOOD (ROUTINE X 2)     Status: None   Collection Time    11/20/12  2:19 PM      Result Value Range Status   Specimen Description BLOOD RIGHT HAND   Final   Special Requests BOTTLES DRAWN AEROBIC AND ANAEROBIC 10CC   Final   Culture  Setup Time     Final   Value: 11/20/2012 17:58     Performed at [facility]   Culture     Final   Value: GRAM POSITIVE COCCI IN CLUSTERS     Note: Gram Stain Report Called to,Read Back By and Verified With: Bonnee Quin RN on 11/21/12 at 03:10 by Christie Nottingham     Performed at [facility]   Report Status PENDING   Incomplete  URINE CULTURE     Status: None   Collection Time    11/20/12  3:05 PM      Result Value Range Status   Specimen Description URINE, CATHETERIZED   Final   Special Requests NONE   Final   Culture  Setup Time     Final   Value: 11/20/2012 20:00     Performed at [facility]   Culture     Final   Value: 50,000 COLONIES/mL LACTOBACILLUS SPECIES  Note: Standardized susceptibility testing for this organism is not available.     Performed at [facility]   Report Status 11/21/2012 FINAL   Final  MRSA PCR SCREENING     Status: Abnormal   Collection Time    11/20/12  5:52 PM      Result Value Range Status   MRSA by PCR POSITIVE (*) NEGATIVE Final   Comment:            The GeneXpert MRSA Assay (FDA     approved for NASAL specimens     only), is one component of a     comprehensive MRSA colonization     surveillance program. It is not     intended to diagnose MRSA     infection nor to guide or     monitor treatment for     MRSA infections.     RESULT CALLED TO, READ BACK BY AND VERIFIED WITH:     GRACOU,R RN 1949 11/20/12 MITCHELL,L  CLOSTRIDIUM DIFFICILE BY PCR     Status: Abnormal   Collection Time    11/20/12  8:27 PM      Result Value Range Status   C difficile by pcr POSITIVE (*) NEGATIVE Final   Comment: CRITICAL RESULT CALLED TO, READ BACK BY  AND VERIFIED WITH:     PETTIFORD A RN 11/21/12 0805 COSTELLO B    Anti-infectives   Start     Dose/Rate Route Frequency Ordered Stop   11/21/12 1200  vancomycin (VANCOCIN) 500 mg in sodium chloride 0.9 % 100 mL IVPB     500 mg 100 mL/hr over 60 Minutes Intravenous Every M-W-F (Hemodialysis) 11/21/12 0928     11/21/12 0930  vancomycin (VANCOCIN) 1,250 mg in sodium chloride 0.9 % 250 mL IVPB     1,250 mg 166.7 mL/hr over 90 Minutes Intravenous STAT 11/21/12 0927 11/22/12 0930   11/20/12 1800  cefTRIAXone (ROCEPHIN) 1 g in dextrose 5 % 50 mL IVPB     1 g 100 mL/hr over 30 Minutes Intravenous Every 24 hours 11/20/12 1756     11/20/12 1800  vancomycin (VANCOCIN) 50 mg/mL oral solution 500 mg     500 mg Oral 4 times per day 11/20/12 1756 12/04/12 1759   11/20/12 1756  metroNIDAZOLE (FLAGYL) IVPB 500 mg     500 mg 100 mL/hr over 60 Minutes Intravenous Every 8 hours 11/20/12 1756 12/04/12 1759   11/20/12 1545  metroNIDAZOLE (FLAGYL) IVPB 500 mg     500 mg 100 mL/hr over 60 Minutes Intravenous  Once 11/20/12 1535 11/20/12 1725   11/20/12 1545  cefTRIAXone (ROCEPHIN) 1 g in dextrose 5 % 50 mL IVPB     1 g 100 mL/hr over 30 Minutes Intravenous  Once 11/20/12 1538 11/20/12 1724      Assessment: 79 y.o. F ordered to start on Vancomycin on 11/17 for 2/2 BCx growing GPC in clusters >> now resulted as Staph Aureus. Floor RN did not give Vancomycin as she thought it would be given with HD however she went ahead and charted on the Vancomycin therefore it was likely not given with hemodialysis either. The RN who had the patient in hemodialysis is not working today -- so cannot confirm this however the per the HD unit records, it appears as if it was NOT given. Will give Vanc 1g IV x 1 dose STAT to make-up for this and will check a random level in the morning to ensure doses on 11/17 not  given.  The patient was also on empiric Rocephin for r/o UTI -- now growing 50k of lactobacillus. Per discussion with  Dr. Daiva Eves -- will d/c this and change to Ancef while awaiting Staph Aureus sensitivities.   Goal of Therapy:  Vancomycin trough level 15-20 mcg/ml Proper antibiotics for infection/cultures adjusted for renal/hepatic function   Plan:  1. Vanc 1g IV x 1 dose STAT to make up for missed doses on 11/17 followed by 500 mg post HD sessions on MWF 2. Cefazolin 2g IV x 1 dose STAT followed by 2g IV post HD sessions on MWF 3. Will obtain a pre-HD Vancomycin level prior to HD on 11/19 a.m to ensure doses still appropriate 4. Will continue to follow HD schedule/duration, culture results, LOT, and antibiotic de-escalation plans   Georgina Pillion, PharmD, BCPS Clinical Pharmacist Pager: [phone] 11/22/2012 11:32 AM

## 2012-11-22 NOTE — Consult Note (Signed)
[facility]    Date of Admission:  11/20/2012  Date of Consult:  11/22/2012  Reason for Consult: MRSA bacteremia, rule out prosthetic valve, native valve endocarditis + C difficile colitis Referring Physician: Dr. Butler Denmark   HPI: [name] is an 79 y.o. female with past medical history significant for aortic valve replacement in 2009, prior coronary artery bypass grafting, diabetes mellitus, end-stage renal disease on hemodialysis via a fistula who presents to the hospital with acute onset of nausea vomiting and profuse diarrhea. On arrival the emergency department she was hypotensive requiring volume resuscitation her lactic acid was above 11. Urine and blood cultures were obtained and a C. differential PCR was performed on her stool. She was placed on IV vancomycin, ceftriaxone and oral vancomycin and metronidazole. C. difficile PCR came back positive. Since then she is improved clinically but has now been found to be bacteremic with Staphylococcus aureus grown on both cultures of admission.  She has had hemodialysis every week 3 week times a week faithfully. She has not noticed problems at the fistula site. See has also had recent surgery on her nose to remove a basal cell carcinoma performed at [facility] within the past month. She has had no problems with either of her prosthetic knees.   Past Medical History  Diagnosis Date  . Anemia     NOS  . Personal history of colonic polyps   . GERD (gastroesophageal reflux disease)   . Hyperlipidemia     takes Simvastatin nightly  . Osteoarthritis   . Osteopenia   . Urinary incontinence   . Pulmonary hypertension   . Anxiety   . Renal insufficiency   . Coronary artery disease   . Aortic stenosis   . Hyperlipidemia   . Hypertension     takes Metoprolol daily  . Peripheral vascular disease   . CHF (congestive heart failure)   . Migraine     hx of  . Constipation   . Diarrhea   . Oligouria   .  History of blood transfusion   . Diabetes mellitus     type II;was on Glipizide but has been off x 6mon  . Hypothyroidism     takes Synthroid daily  . Cancer     Mole on top of head to be removed in July 2013    Past Surgical History  Procedure Laterality Date  . Abdominal hysterectomy    . Tonsillectomy    . Cardiac catheterization  2000    cad  . Replacement total knee bilateral  05/1998  . Cataract extraction    . Coronary artery bypass graft      od  . Adenosine myoview  2007    benign, EF 69%  . Carotid endarterectomy  2011    Right  . Coronary artery bypass graft  2009  . Aortic valve replacement  2009  . Amputation-left great toe  7/12    Dr August Saucer  . Traumatic amputation of right dip joint of index finger    . Insertion of dialysis catheter  12/18/2010    Procedure: INSERTION OF DIALYSIS CATHETER;  Surgeon: Pryor Ochoa, MD;  Location: [facility] OR;  Service: Vascular;  Laterality: Right;  . Av fistula placement  12/22/2010    Procedure: ARTERIOVENOUS (AV) FISTULA CREATION;  Surgeon: Nilda Simmer, MD;  Location: [facility] OR;  Service: Vascular;  Laterality: Left;  Creation of Left Brachial-Cephalic Fistula  . Appendectomy    .  Eye surgery      BIlateral  . Amputation  06/18/2011    Procedure: AMPUTATION DIGIT;  Surgeon: Nadara Mustard, MD;  Location: [facility] OR;  Service: Orthopedics;  Laterality: Left;  Left 2nd Toe Amputation at MTP Joint  ergies:   Allergies  Allergen Reactions  . Captopril     REACTION: unspecified  . Enalapril Maleate     REACTION: cough  . Nitrofurantoin     REACTION: itching  . Ramipril     REACTION: unspecified  . Sulfa Antibiotics Other (See Comments)    Reaction unknown  . Verapamil     REACTION: unspecified     Medications: I have reviewed patients current medications as documented in Epic Anti-infectives   Start     Dose/Rate Route Frequency Ordered Stop   11/23/12 2000  ceFAZolin (ANCEF) IVPB 1 g/50 mL premix  Status:  Discontinued      1 g 100 mL/hr over 30 Minutes Intravenous Every M-W-F (2000) 11/22/12 1138 11/22/12 1139   11/23/12 2000  ceFAZolin (ANCEF) IVPB 2 g/50 mL premix     2 g 100 mL/hr over 30 Minutes Intravenous Every M-W-F (2000) 11/22/12 1139     11/23/12 1600  gentamicin (GARAMYCIN) IVPB 60 mg     60 mg 100 mL/hr over 30 Minutes Intravenous Every M-W-F (Hemodialysis) 11/22/12 1526     11/22/12 1530  gentamicin (GARAMYCIN) IVPB 80 mg     80 mg 100 mL/hr over 30 Minutes Intravenous NOW 11/22/12 1524 11/23/12 1530   11/22/12 1500  rifampin (RIFADIN) 300 mg in sodium chloride 0.9 % 100 mL IVPB     300 mg 200 mL/hr over 30 Minutes Intravenous 3 times per day 11/22/12 1359     11/22/12 1200  vancomycin (VANCOCIN) IVPB 1000 mg/200 mL premix     1,000 mg 200 mL/hr over 60 Minutes Intravenous STAT 11/22/12 1114 11/22/12 1347   11/22/12 1200  ceFAZolin (ANCEF) IVPB 2 g/50 mL premix     2 g 100 mL/hr over 30 Minutes Intravenous STAT 11/22/12 1138 11/22/12 1316   11/21/12 1200  vancomycin (VANCOCIN) 500 mg in sodium chloride 0.9 % 100 mL IVPB     500 mg 100 mL/hr over 60 Minutes Intravenous Every M-W-F (Hemodialysis) 11/21/12 0928     11/21/12 0930  vancomycin (VANCOCIN) 1,250 mg in sodium chloride 0.9 % 250 mL IVPB     1,250 mg 166.7 mL/hr over 90 Minutes Intravenous STAT 11/21/12 0927 11/22/12 0930   11/20/12 1800  cefTRIAXone (ROCEPHIN) 1 g in dextrose 5 % 50 mL IVPB  Status:  Discontinued     1 g 100 mL/hr over 30 Minutes Intravenous Every 24 hours 11/20/12 1756 11/22/12 1120   11/20/12 1800  vancomycin (VANCOCIN) 50 mg/mL oral solution 500 mg     500 mg Oral 4 times per day 11/20/12 1756 12/04/12 1759   11/20/12 1756  metroNIDAZOLE (FLAGYL) IVPB 500 mg  Status:  Discontinued     500 mg 100 mL/hr over 60 Minutes Intravenous Every 8 hours 11/20/12 1756 11/22/12 1400   11/20/12 1545  metroNIDAZOLE (FLAGYL) IVPB 500 mg     500 mg 100 mL/hr over 60 Minutes Intravenous  Once 11/20/12 1535 11/20/12 1725    11/20/12 1545  cefTRIAXone (ROCEPHIN) 1 g in dextrose 5 % 50 mL IVPB     1 g 100 mL/hr over 30 Minutes Intravenous  Once 11/20/12 1538 11/20/12 1724      Social History:  reports that she quit  smoking about 69 years ago. Her smoking use included Cigarettes. She smoked 0.00 packs per day for 15 years. She has never used smokeless tobacco. She reports that she drinks alcohol. She reports that she does not use illicit drugs.  History reviewed. No pertinent family history.  As in HPI and primary teams notes otherwise 12 point review of systems is negative  Blood pressure 134/49, pulse 75, temperature 97.8 F (36.6 C), temperature source Oral, resp. rate 33, height 5\' 2"  (1.575 m), weight 117 lb 4.6 oz (53.2 kg), SpO2 100.00%. General: Alert and awake, oriented x3, not in any acute distress. HEENT: anicteric sclera, pupils reactive to light and accommodation, EOMI, oropharynx clear and without exudate, nose with ulceration, scar from surgery, no purulence CVS II/VI systolic murmur, , reg rate and rhythm Chest: clear to auscultation bilaterally, no wheezing, rales or rhonchi Abdomen: soft nontender, nondistended, normal bowel sounds, Extremities: Both knees are without effusions right knee slightly more warm the left but not significantly. Examination of her feet failed to show any evidence of active infection she has prior indication site.   Skin: Healing lesion over her nares her AV fistula site is not grossly periodic. She does not have any Janeway or Osler's nodes Neuro: nonfocal, strength and sensation intact   Results for orders placed during the hospital encounter of 11/20/12 (from the past 48 hour(s))  MRSA PCR SCREENING     Status: Abnormal   Collection Time    11/20/12  5:52 PM      Result Value Range   MRSA by PCR POSITIVE (*) NEGATIVE   Comment:            The GeneXpert MRSA Assay (FDA     approved for NASAL specimens     only), is one component of a     comprehensive MRSA  colonization     surveillance program. It is not     intended to diagnose MRSA     infection nor to guide or     monitor treatment for     MRSA infections.     RESULT CALLED TO, READ BACK BY AND VERIFIED WITH:     GRACOU,R RN 1949 11/20/12 MITCHELL,L  INFLUENZA PANEL BY PCR     Status: None   Collection Time    11/20/12  6:29 PM      Result Value Range   Influenza A By PCR NEGATIVE  NEGATIVE   Influenza B By PCR NEGATIVE  NEGATIVE   H1N1 flu by pcr NOT DETECTED  NOT DETECTED   Comment:            The Xpert Flu assay (FDA approved for     nasal aspirates or washes and     nasopharyngeal swab specimens), is     intended as an aid in the diagnosis of     influenza and should not be used as     a sole basis for treatment.  GLUCOSE, CAPILLARY     Status: Abnormal   Collection Time    11/20/12  6:43 PM      Result Value Range   Glucose-Capillary 161 (*) 70 - 99 mg/dL   Comment 1 Notify RN    GLUCOSE, CAPILLARY     Status: Abnormal   Collection Time    11/20/12  7:48 PM      Result Value Range   Glucose-Capillary 159 (*) 70 - 99 mg/dL  CLOSTRIDIUM DIFFICILE BY PCR     Status: Abnormal  Collection Time    11/20/12  8:27 PM      Result Value Range   C difficile by pcr POSITIVE (*) NEGATIVE   Comment: CRITICAL RESULT CALLED TO, READ BACK BY AND VERIFIED WITH:     PETTIFORD A RN 11/21/12 0805 COSTELLO B  GLUCOSE, CAPILLARY     Status: Abnormal   Collection Time    11/20/12 11:48 PM      Result Value Range   Glucose-Capillary 132 (*) 70 - 99 mg/dL  GLUCOSE, CAPILLARY     Status: Abnormal   Collection Time    11/21/12  4:02 AM      Result Value Range   Glucose-Capillary 128 (*) 70 - 99 mg/dL  BASIC METABOLIC PANEL     Status: Abnormal   Collection Time    11/21/12  5:07 AM      Result Value Range   Sodium 132 (*) 135 - 145 mEq/L   Potassium 4.3  3.5 - 5.1 mEq/L   Chloride 93 (*) 96 - 112 mEq/L   CO2 18 (*) 19 - 32 mEq/L   Glucose, Bld 116 (*) 70 - 99 mg/dL   BUN 84 (*)  6 - 23 mg/dL   Creatinine, Ser 1.61 (*) 0.50 - 1.10 mg/dL   Calcium 8.8  8.4 - 09.6 mg/dL   GFR calc non Af Amer 5 (*) >90 mL/min   GFR calc Af Amer 5 (*) >90 mL/min   Comment: (NOTE)     The eGFR has been calculated using the CKD EPI equation.     This calculation has not been validated in all clinical situations.     eGFR's persistently <90 mL/min signify possible Chronic Kidney     Disease.  CBC     Status: Abnormal   Collection Time    11/21/12  5:07 AM      Result Value Range   WBC 16.6 (*) 4.0 - 10.5 K/uL   RBC 3.01 (*) 3.87 - 5.11 MIL/uL   Hemoglobin 10.2 (*) 12.0 - 15.0 g/dL   HCT 04.5 (*) 40.9 - 81.1 %   MCV 101.7 (*) 78.0 - 100.0 fL   MCH 33.9  26.0 - 34.0 pg   MCHC 33.3  30.0 - 36.0 g/dL   RDW 91.4  78.2 - 95.6 %   Platelets 104 (*) 150 - 400 K/uL   Comment: PLATELET COUNT CONFIRMED BY SMEAR     DELTA CHECK NOTED  PHOSPHORUS     Status: Abnormal   Collection Time    11/21/12  5:07 AM      Result Value Range   Phosphorus 5.5 (*) 2.3 - 4.6 mg/dL  GLUCOSE, CAPILLARY     Status: Abnormal   Collection Time    11/21/12  7:36 AM      Result Value Range   Glucose-Capillary 152 (*) 70 - 99 mg/dL  GLUCOSE, CAPILLARY     Status: Abnormal   Collection Time    11/21/12 11:54 AM      Result Value Range   Glucose-Capillary 117 (*) 70 - 99 mg/dL  GLUCOSE, CAPILLARY     Status: None   Collection Time    11/21/12  5:42 PM      Result Value Range   Glucose-Capillary 81  70 - 99 mg/dL  GLUCOSE, CAPILLARY     Status: None   Collection Time    11/21/12  9:01 PM      Result Value Range   Glucose-Capillary 82  70 -  99 mg/dL   Comment 1 Documented in Chart     Comment 2 Notify RN    CBC     Status: Abnormal   Collection Time    11/22/12  7:20 AM      Result Value Range   WBC 11.2 (*) 4.0 - 10.5 K/uL   RBC 2.88 (*) 3.87 - 5.11 MIL/uL   Hemoglobin 9.9 (*) 12.0 - 15.0 g/dL   HCT 16.1 (*) 09.6 - 04.5 %   MCV 100.0  78.0 - 100.0 fL   MCH 34.4 (*) 26.0 - 34.0 pg   MCHC 34.4   30.0 - 36.0 g/dL   RDW 40.9  81.1 - 91.4 %   Platelets 98 (*) 150 - 400 K/uL   Comment: CONSISTENT WITH PREVIOUS RESULT  COMPREHENSIVE METABOLIC PANEL     Status: Abnormal   Collection Time    11/22/12  7:20 AM      Result Value Range   Sodium 133 (*) 135 - 145 mEq/L   Potassium 3.3 (*) 3.5 - 5.1 mEq/L   Comment: DELTA CHECK NOTED   Chloride 94 (*) 96 - 112 mEq/L   CO2 23  19 - 32 mEq/L   Glucose, Bld 101 (*) 70 - 99 mg/dL   BUN 35 (*) 6 - 23 mg/dL   Comment: DELTA CHECK NOTED   Creatinine, Ser 3.76 (*) 0.50 - 1.10 mg/dL   Comment: DELTA CHECK NOTED   Calcium 8.5  8.4 - 10.5 mg/dL   Total Protein 6.1  6.0 - 8.3 g/dL   Albumin 2.3 (*) 3.5 - 5.2 g/dL   AST 782 (*) 0 - 37 U/L   ALT 69 (*) 0 - 35 U/L   Alkaline Phosphatase 102  39 - 117 U/L   Total Bilirubin 0.3  0.3 - 1.2 mg/dL   GFR calc non Af Amer 10 (*) >90 mL/min   GFR calc Af Amer 11 (*) >90 mL/min   Comment: (NOTE)     The eGFR has been calculated using the CKD EPI equation.     This calculation has not been validated in all clinical situations.     eGFR's persistently <90 mL/min signify possible Chronic Kidney     Disease.  GLUCOSE, CAPILLARY     Status: None   Collection Time    11/22/12  8:57 AM      Result Value Range   Glucose-Capillary 88  70 - 99 mg/dL  HEPATITIS B SURFACE ANTIGEN     Status: None   Collection Time    11/22/12 10:00 AM      Result Value Range   Hepatitis B Surface Ag NEGATIVE  NEGATIVE   Comment: Performed at [facility]  GLUCOSE, CAPILLARY     Status: Abnormal   Collection Time    11/22/12 12:56 PM      Result Value Range   Glucose-Capillary 173 (*) 70 - 99 mg/dL      Component Value Date/Time   SDES URINE, CATHETERIZED 11/20/2012 1505   SPECREQUEST NONE 11/20/2012 1505   CULT  Value: 50,000 COLONIES/mL LACTOBACILLUS SPECIES Note: Standardized susceptibility testing for this organism is not available. Performed at [facility] 11/20/2012 1505   REPTSTATUS 11/21/2012  FINAL 11/20/2012 1505   No results found.   Recent Results (from the past 720 hour(s))  CULTURE, BLOOD (ROUTINE X 2)     Status: None   Collection Time    11/20/12  1:39 PM      Result  Value Range Status   Specimen Description BLOOD RIGHT ARM   Final   Special Requests BOTTLES DRAWN AEROBIC AND ANAEROBIC 4CC   Final   Culture  Setup Time     Final   Value: 11/20/2012 17:58     Performed at [facility]   Culture     Final   Value: STAPHYLOCOCCUS AUREUS     Note: RIFAMPIN AND GENTAMICIN SHOULD NOT BE USED AS SINGLE DRUGS FOR TREATMENT OF STAPH INFECTIONS.     Note: Gram Stain Report Called to,Read Back By and Verified With: Bonnee Quin RN on 11/21/12 at 03:10 by Christie Nottingham     Performed at [facility]   Report Status PENDING   Incomplete  CULTURE, BLOOD (ROUTINE X 2)     Status: None   Collection Time    11/20/12  2:19 PM      Result Value Range Status   Specimen Description BLOOD RIGHT HAND   Final   Special Requests BOTTLES DRAWN AEROBIC AND ANAEROBIC 10CC   Final   Culture  Setup Time     Final   Value: 11/20/2012 17:58     Performed at [facility]   Culture     Final   Value: STAPHYLOCOCCUS AUREUS     Note: Gram Stain Report Called to,Read Back By and Verified With: Bonnee Quin RN on 11/21/12 at 03:10 by Christie Nottingham     Performed at [facility]   Report Status PENDING   Incomplete  URINE CULTURE     Status: None   Collection Time    11/20/12  3:05 PM      Result Value Range Status   Specimen Description URINE, CATHETERIZED   Final   Special Requests NONE   Final   Culture  Setup Time     Final   Value: 11/20/2012 20:00     Performed at [facility]   Culture     Final   Value: 50,000 COLONIES/mL LACTOBACILLUS SPECIES     Note: Standardized susceptibility testing for this organism is not available.     Performed at [facility]   Report Status 11/21/2012 FINAL   Final  MRSA PCR SCREENING     Status: Abnormal    Collection Time    11/20/12  5:52 PM      Result Value Range Status   MRSA by PCR POSITIVE (*) NEGATIVE Final   Comment:            The GeneXpert MRSA Assay (FDA     approved for NASAL specimens     only), is one component of a     comprehensive MRSA colonization     surveillance program. It is not     intended to diagnose MRSA     infection nor to guide or     monitor treatment for     MRSA infections.     RESULT CALLED TO, READ BACK BY AND VERIFIED WITH:     GRACOU,R RN 1949 11/20/12 MITCHELL,L  CLOSTRIDIUM DIFFICILE BY PCR     Status: Abnormal   Collection Time    11/20/12  8:27 PM      Result Value Range Status   C difficile by pcr POSITIVE (*) NEGATIVE Final   Comment: CRITICAL RESULT CALLED TO, READ BACK BY AND VERIFIED WITH:     PETTIFORD A RN 11/21/12 0805 COSTELLO B     Impression/Recommendation  79 year old lady with history of  diabetes mellitus coronary artery disease valvular heart disease status post coronary artery bypass grafting aortic valve replacement in 2009 and stage renal disease on hemodialysis via AV fistula now with  staph aureus bacteremia with concern for infection of her aortic valve or one of her native valves. She has had recent surgery to remove basal cell carcinoma.  #1 Staphylococcus Aureus Bacteremia, rule out endocarditis:  --continue vancomycin plus cefazolin to know whether this is methicillin-resistant methicillin sensitive staph aureus --Add gentamicin and rifampin given concern for endocarditis --Repeat blood cultures to ensure clearance of her bacteremia  --She should have the AV fistula formally evaluated by vascular surgery for possible source of infection   --I. am ordering a 2-D echocardiogram. If this is unrevealing would consult cardiology for consideration of transesophageal echocardiogram  --I anticipate treating her for 6 weeks with IV antibiotics possibly with vancomycin versus cefazolin plus gentamicin and rifampin. Certainly  she is RE on dialysis still not worry about nephrotoxicity but there is concern for risk of oto toxicity with gentamicin in this patient who is RE hard of hearing.  I spent greater than 60 minutes with the patient including greater than 50% of time in face to face counsel of the patient and in coordination of their care.  #2 C difficile colitis: has had ONCE before in context of IV therapy for osteomyelitis of her foot  -Given her presentation acutely with septis I will put her on oral vancomycin at 500mg  qid  --she will be at significant risk of relapse while on abx for bacteremia, possible AVF and possible prosthetic and or native valve infection    Thank you so much for this interesting consult  [facility] [phone] (pager) [phone] (office) 11/22/2012, 4:02 PM  Paulette Blanch Dam 11/22/2012, 4:02 PM

## 2012-11-23 DIAGNOSIS — R110 Nausea: Secondary | ICD-10-CM

## 2012-11-23 DIAGNOSIS — T82897A Other specified complication of cardiac prosthetic devices, implants and grafts, initial encounter: Secondary | ICD-10-CM

## 2012-11-23 LAB — CULTURE, BLOOD (ROUTINE X 2)

## 2012-11-23 LAB — GLUCOSE, CAPILLARY
Glucose-Capillary: 129 mg/dL — ABNORMAL HIGH (ref 70–99)
Glucose-Capillary: 138 mg/dL — ABNORMAL HIGH (ref 70–99)

## 2012-11-23 MED ORDER — DOXERCALCIFEROL 4 MCG/2ML IV SOLN
INTRAVENOUS | Status: AC
  Filled 2012-11-23: qty 2

## 2012-11-23 MED ORDER — ALTEPLASE 2 MG IJ SOLR: 2.0000 mg | Freq: Once | INTRAMUSCULAR | Status: AC | PRN

## 2012-11-23 MED ORDER — SODIUM CHLORIDE 0.9 % IV SOLN: 100.0000 mL | INTRAVENOUS | Status: DC | PRN

## 2012-11-23 MED ORDER — PENTAFLUOROPROP-TETRAFLUOROETH EX AERO: 1.0000 "application " | INHALATION_SPRAY | CUTANEOUS | Status: DC | PRN

## 2012-11-23 MED ORDER — HEPARIN SODIUM (PORCINE) 1000 UNIT/ML DIALYSIS: 4200.0000 [IU] | Freq: Once | INTRAMUSCULAR | Status: DC

## 2012-11-23 MED ORDER — CALCIUM CARBONATE ANTACID 500 MG PO CHEW
1.0000 | CHEWABLE_TABLET | Freq: Three times a day (TID) | ORAL | Status: DC
  Administered 2012-11-23 – 2012-12-06 (×32): 200 mg via ORAL
  Filled 2012-11-23 (×42): qty 1

## 2012-11-23 MED ORDER — ONDANSETRON HCL 4 MG/2ML IJ SOLN
4.0000 mg | Freq: Four times a day (QID) | INTRAMUSCULAR | Status: DC | PRN
  Administered 2012-12-01 – 2012-12-02 (×2): 4 mg via INTRAVENOUS
  Filled 2012-11-23 (×3): qty 2

## 2012-11-23 MED ORDER — HEPARIN SODIUM (PORCINE) 1000 UNIT/ML DIALYSIS: 1000.0000 [IU] | INTRAMUSCULAR | Status: DC | PRN

## 2012-11-23 MED ORDER — LIDOCAINE-PRILOCAINE 2.5-2.5 % EX CREA
1.0000 | TOPICAL_CREAM | CUTANEOUS | Status: DC | PRN
Start: 2012-11-23 — End: 2012-11-25

## 2012-11-23 MED ORDER — NEPRO/CARBSTEADY PO LIQD: 237.0000 mL | ORAL | Status: DC | PRN

## 2012-11-23 MED ORDER — NEPRO/CARBSTEADY PO LIQD
237.0000 mL | Freq: Two times a day (BID) | ORAL | Status: DC
  Administered 2012-11-24 – 2012-11-25 (×2): 237 mL via ORAL

## 2012-11-23 MED ORDER — LIDOCAINE HCL (PF) 1 % IJ SOLN: 5.0000 mL | INTRAMUSCULAR | Status: DC | PRN

## 2012-11-23 NOTE — Progress Notes (Signed)
[facility]  Day # 3 vancomycin IV Day #2 ancef Day #2 rifampin Day # 2 gentamicin Day #2 oral vancomcyin   Subjective: nauseous   Antibiotics:  Anti-infectives   Start     Dose/Rate Route Frequency Ordered Stop   11/23/12 2000  ceFAZolin (ANCEF) IVPB 1 g/50 mL premix  Status:  Discontinued     1 g 100 mL/hr over 30 Minutes Intravenous Every M-W-F (2000) 11/22/12 1138 11/22/12 1139   11/23/12 2000  ceFAZolin (ANCEF) IVPB 2 g/50 mL premix     2 g 100 mL/hr over 30 Minutes Intravenous Every M-W-F (2000) 11/22/12 1139     11/23/12 1600  gentamicin (GARAMYCIN) IVPB 60 mg     60 mg 100 mL/hr over 30 Minutes Intravenous Every M-W-F (Hemodialysis) 11/22/12 1526     11/22/12 1530  gentamicin (GARAMYCIN) IVPB 80 mg     80 mg 100 mL/hr over 30 Minutes Intravenous NOW 11/22/12 1524 11/22/12 1655   11/22/12 1500  rifampin (RIFADIN) 300 mg in sodium chloride 0.9 % 100 mL IVPB     300 mg 200 mL/hr over 30 Minutes Intravenous 3 times per day 11/22/12 1359     11/22/12 1200  vancomycin (VANCOCIN) IVPB 1000 mg/200 mL premix     1,000 mg 200 mL/hr over 60 Minutes Intravenous STAT 11/22/12 1114 11/22/12 1347   11/22/12 1200  ceFAZolin (ANCEF) IVPB 2 g/50 mL premix     2 g 100 mL/hr over 30 Minutes Intravenous STAT 11/22/12 1138 11/22/12 1316   11/21/12 1200  vancomycin (VANCOCIN) 500 mg in sodium chloride 0.9 % 100 mL IVPB     500 mg 100 mL/hr over 60 Minutes Intravenous Every M-W-F (Hemodialysis) 11/21/12 0928     11/21/12 0930  vancomycin (VANCOCIN) 1,250 mg in sodium chloride 0.9 % 250 mL IVPB     1,250 mg 166.7 mL/hr over 90 Minutes Intravenous STAT 11/21/12 0927 11/22/12 0930   11/20/12 1800  cefTRIAXone (ROCEPHIN) 1 g in dextrose 5 % 50 mL IVPB  Status:  Discontinued     1 g 100 mL/hr over 30 Minutes Intravenous Every 24 hours 11/20/12 1756 11/22/12 1120   11/20/12 1800  vancomycin (VANCOCIN) 50 mg/mL oral solution 500 mg     500 mg Oral 4 times per  day 11/20/12 1756 12/04/12 1759   11/20/12 1756  metroNIDAZOLE (FLAGYL) IVPB 500 mg  Status:  Discontinued     500 mg 100 mL/hr over 60 Minutes Intravenous Every 8 hours 11/20/12 1756 11/22/12 1400   11/20/12 1545  metroNIDAZOLE (FLAGYL) IVPB 500 mg     500 mg 100 mL/hr over 60 Minutes Intravenous  Once 11/20/12 1535 11/20/12 1725   11/20/12 1545  cefTRIAXone (ROCEPHIN) 1 g in dextrose 5 % 50 mL IVPB     1 g 100 mL/hr over 30 Minutes Intravenous  Once 11/20/12 1538 11/20/12 1724      Medications: Scheduled Meds: . aspirin EC  81 mg Oral Daily  .  ceFAZolin (ANCEF) IV  2 g Intravenous Q M,W,F-2000  . Chlorhexidine Gluconate Cloth  6 each Topical Q0600  . darbepoetin  25 mcg Intravenous Q Mon-HD  . doxercalciferol  4 mcg Intravenous Q M,W,F-HD  . folic acid  0.5 mg Oral Daily  . gentamicin  60 mg Intravenous Q M,W,F-HD  . insulin aspart  0-9 Units Subcutaneous TID WC  . levothyroxine  75 mcg Oral QAC breakfast  . metoprolol  75 mg Oral BID  .  multivitamin  1 tablet Oral QHS  . mupirocin ointment  1 application Nasal BID  . rifampin (RIFADIN) IVPB  300 mg Intravenous Q8H  . sodium chloride  3 mL Intravenous Q12H  . vancomycin  500 mg Oral Q6H  . vancomycin (VANCOCIN) 500 mg IVPB  500 mg Intravenous Q M,W,F-HD  . vitamin C  500 mg Oral Daily   Continuous Infusions: . sodium chloride 10 mL/hr (11/21/12 2245)   PRN Meds:.acetaminophen, acetaminophen, hydrALAZINE, HYDROcodone-acetaminophen   Objective: Weight change:   Intake/Output Summary (Last 24 hours) at 11/23/12 0906 Last data filed at 11/23/12 0900  Gross per 24 hour  Intake    473 ml  Output      0 ml  Net    473 ml   Blood pressure 182/66, pulse 76, temperature 97.4 F (36.3 C), temperature source Oral, resp. rate 24, height 5\' 2"  (1.575 m), weight 117 lb 4.6 oz (53.2 kg), SpO2 93.00%. Temp:  [97.4 F (36.3 C)-98.3 F (36.8 C)] 97.4 F (36.3 C) (11/19 0737) Pulse Rate:  [65-81] 76 (11/19 0737) Resp:  [21-33]  24 (11/19 0737) BP: (126-182)/(43-66) 182/66 mmHg (11/19 0737) SpO2:  [93 %-100 %] 93 % (11/19 0737)  Physical Exam: General: Alert and awake, oriented x3, holding emesis basin.  HEENT: anicteric sclera, pupils reactive to light and accommodation, EOMI, oropharynx clear and without exudate, nose with ulceration, scar from surgery, no purulence  CVS II/VI systolic murmur, , reg rate and rhythm  Chest: clear to auscultation bilaterally, no wheezing, rales or rhonchi  Abdomen: soft nontender, nondistended, normal bowel sounds,  Extremities: Both knees are without effusions right knee slightly more warm the left but not significantly. Examination of her feet failed to show any evidence of active infection she has prior indication site.   Lab Results:  Recent Labs  11/21/12 0507 11/22/12 0720  WBC 16.6* 11.2*  HGB 10.2* 9.9*  HCT 30.6* 28.8*  PLT 104* 98*    BMET  Recent Labs  11/21/12 0507 11/22/12 0720  NA 132* 133*  K 4.3 3.3*  CL 93* 94*  CO2 18* 23  GLUCOSE 116* 101*  BUN 84* 35*  CREATININE 7.06* 3.76*  CALCIUM 8.8 8.5    Micro Results: Recent Results (from the past 240 hour(s))  CULTURE, BLOOD (ROUTINE X 2)     Status: None   Collection Time    11/20/12  1:39 PM      Result Value Range Status   Specimen Description BLOOD RIGHT ARM   Final   Special Requests BOTTLES DRAWN AEROBIC AND ANAEROBIC 4CC   Final   Culture  Setup Time     Final   Value: 11/20/2012 17:58     Performed at [facility]   Culture     Final   Value: STAPHYLOCOCCUS AUREUS     Note: RIFAMPIN AND GENTAMICIN SHOULD NOT BE USED AS SINGLE DRUGS FOR TREATMENT OF STAPH INFECTIONS.     Note: Gram Stain Report Called to,Read Back By and Verified With: Bonnee Quin RN on 11/21/12 at 03:10 by Christie Nottingham     Performed at [facility]   Report Status 11/23/2012 FINAL   Final   Organism ID, Bacteria STAPHYLOCOCCUS AUREUS   Final  CULTURE, BLOOD (ROUTINE X 2)     Status: None    Collection Time    11/20/12  2:19 PM      Result Value Range Status   Specimen Description BLOOD RIGHT HAND   Final  Special Requests BOTTLES DRAWN AEROBIC AND ANAEROBIC 10CC   Final   Culture  Setup Time     Final   Value: 11/20/2012 17:58     Performed at [facility]   Culture     Final   Value: STAPHYLOCOCCUS AUREUS     Note: SUSCEPTIBILITIES PERFORMED ON PREVIOUS CULTURE WITHIN THE LAST 5 DAYS.     Note: Gram Stain Report Called to,Read Back By and Verified With: Bonnee Quin RN on 11/21/12 at 03:10 by Christie Nottingham     Performed at [facility]   Report Status 11/23/2012 FINAL   Final  URINE CULTURE     Status: None   Collection Time    11/20/12  3:05 PM      Result Value Range Status   Specimen Description URINE, CATHETERIZED   Final   Special Requests NONE   Final   Culture  Setup Time     Final   Value: 11/20/2012 20:00     Performed at [facility]   Culture     Final   Value: 50,000 COLONIES/mL LACTOBACILLUS SPECIES     Note: Standardized susceptibility testing for this organism is not available.     Performed at [facility]   Report Status 11/21/2012 FINAL   Final  MRSA PCR SCREENING     Status: Abnormal   Collection Time    11/20/12  5:52 PM      Result Value Range Status   MRSA by PCR POSITIVE (*) NEGATIVE Final   Comment:            The GeneXpert MRSA Assay (FDA     approved for NASAL specimens     only), is one component of a     comprehensive MRSA colonization     surveillance program. It is not     intended to diagnose MRSA     infection nor to guide or     monitor treatment for     MRSA infections.     RESULT CALLED TO, READ BACK BY AND VERIFIED WITH:     GRACOU,R RN 1949 11/20/12 MITCHELL,L  CLOSTRIDIUM DIFFICILE BY PCR     Status: Abnormal   Collection Time    11/20/12  8:27 PM      Result Value Range Status   C difficile by pcr POSITIVE (*) NEGATIVE Final   Comment: CRITICAL RESULT CALLED TO, READ BACK BY AND  VERIFIED WITH:     PETTIFORD A RN 11/21/12 0805 COSTELLO B    Studies/Results: No results found.    Assessment/Plan: [name] is a 79 y.o. female with 79 year old lady with history of diabetes mellitus coronary artery disease valvular heart disease status post coronary artery bypass grafting aortic valve replacement in 2009 and stage renal disease on hemodialysis via AV fistula now with staph aureus bacteremia with concern for infection of her aortic valve or one of her native valves. She has had recent surgery to remove basal cell carcinoma.    #1 Methicillin Sensitive Staphylococcus Aureus Bacteremia, rule out endocarditis: 2 d echo not showing vegetations, Dr. Edilia Bo feels AVF not infected overtly  -- continue cefazolin ,gentamicin and rifampin given concern for endocarditis , dc vancomycin  --Repeated  blood cultures to ensure clearance of her bacteremia   --Greatly appreciate Dr. Edilia Bo evaluating AV fistula  --consult Cardiology for  transesophageal echocardiogram  Even if TEE is negative I still might be inclined to give her all 3 abx  for 6 weeks but if I knew for certain then i would be insistent re the 3 staying on board and it could inform her prognosis and theoretical need for surery  #2 C difficile colitis: -continue vancomycin at 500mg  qid x2 weeks IF NOT for duration of her IV abx + 2 weeks   --she will be at significant risk of relapse while on abx for bacteremia, possible AVF and possible prosthetic and or native valve infection   #3 nausea: make sure she is getting anti emetic and may want to give her zofran round the clock  Dr. Ninetta Lights is covering tomorrow.    LOS: 3 days   Acey Lav 11/23/2012, 9:06 AM

## 2012-11-23 NOTE — Progress Notes (Signed)
    VASCULAR PROGRESS NOTE  SUBJECTIVE: Complains of nausea.  PHYSICAL EXAM: Filed Vitals:   11/22/12 2010 11/22/12 2123 11/23/12 0016 11/23/12 0500  BP: 126/54 136/56 134/43 135/48  Pulse: 73 72 65 66  Temp: 98.3 F (36.8 C)  97.8 F (36.6 C) 97.8 F (36.6 C)  TempSrc: Oral  Oral   Resp: 30  27 22   Height:      Weight:      SpO2: 100%  100% 99%   Left upper arm AVF looks good. No erythema. Good thrill. It looks like they cannulate the fistula in the same 2 areas each time.  LABS: Lab Results  Component Value Date   WBC 11.2* 11/22/2012   HGB 9.9* 11/22/2012   HCT 28.8* 11/22/2012   MCV 100.0 11/22/2012   PLT 98* 11/22/2012   Lab Results  Component Value Date   CREATININE 3.76* 11/22/2012   Lab Results  Component Value Date   INR 1.04 06/18/2011   CBG (last 3)   Recent Labs  11/22/12 0857 11/22/12 1256 11/22/12 1753  GLUCAP 88 173* 146*    Principal Problem:   Sepsis Active Problems:   Type II or unspecified type diabetes mellitus with renal manifestations, not stated as uncontrolled(250.40)   HYPERTENSION   PULMONARY HYPERTENSION   GERD   S/P aortic valve replacement   End stage renal disease   Diarrhea   UTI (lower urinary tract infection)   ASSESSMENT AND PLAN:  * No evidence of infection involving the fistula at this point.   * It looks like they cannulate the fistula in the same 2 areas each time. I would recommend that they stick different areas of the fistula each time (ie rotate sites).  * Being treated for C. Diff.    Cari Caraway Beeper: [phone] 11/23/2012

## 2012-11-23 NOTE — Progress Notes (Signed)
[facility] Progress Note [place] TEAM 1 - Stepdown/ICU TEAM   LATANDRA LOUREIRO NWG:[id number] DOB: [birth year] DOA: 11/20/2012 PCP: Tillman Abide, MD  Admit HPI / Brief Narrative: 79 y.o. female with past medical history of end-stage renal disease, diabetes mellitus, CAD and prior C. difficile who despite her advanced age and medical problems is rather functional and lives alone with her son nearby. Patient had been in her usual state of health when she complained to her son about chills 3 days prior to admit. She went to dialysis and had a low-grade temp. Dialysis was done partially on that day. Patient visited her PCP and a chest x-ray was unremarkable. She spent the weekend with her son, and was brought to the ED when she was noted to be progressively weaker with diarrhea.  In the emergency room the patient was noted to have a lactic acid level 11.9, anion gap of 24 and a glucose of 206. Her white count was 13.2-92% shift and her urine was positive for UTI. Stool studies were sent for C. difficile as was urine culture and patient was started on IV Rocephin and Flagyl.   Assessment/Plan:  Sepsis Due to UTI, Staph bacteremia, and C diff - sepsis physiology resolved   MRSA bacteremia - 2/2 blood cx + Cont abx per ID - TTE w/o overt vegetations - to have TEE per ID   C diff colitis  Oral vanc - follow clinically - avoid PPI - will require prolonged tx due to need for concomitant abx   UTI Lactobacillus species w/ < 50, 000 colonies  ESRD on HD  Nephrology is following - dialysis access (fistula) does not appear to be infected - Vasc Surgery has seen and agrees   Anemia of CKD Follow hemoglobin trend - no evidence of acute blood loss  CAD w/ Hx CABG Denies chest pain  DM2 CBG reasonably controlled at the present time - follow w/o change today   HTN Cont to follow w/o change in tx plan today   Pulmonary HTN  GERD   S/P AoVR Does not require chronic anticoagulation due  to use of bioprosthetic valve - at risk for endocarditis  Code Status: NO CODE BLUE Family Communication: d/w 2 nieces at bedside Disposition Plan: SDU  Consultants: Nephrology  ID Vasc Surgery   Procedures: TTE   Antibiotics: Rocephin 11/16 >>11/17 Vanc 11/16 >> Flagyl 11/16 >>11/18 Rifampin 11/18 >> Gentamicin 11/18 >> Ancef 11/18>>  DVT prophylaxis: SCDs  HPI/Subjective: Diarrhea has resolved.  Denies cp, sob, f/c, or abdom pain.  Has poor appetite and nausea today, but no vomiting thus far.  Objective: Blood pressure 182/66, pulse 76, temperature 97.4 F (36.3 C), temperature source Oral, resp. rate 24, height 5\' 2"  (1.575 m), weight 53.2 kg (117 lb 4.6 oz), SpO2 93.00%.  Intake/Output Summary (Last 24 hours) at 11/23/12 1245 Last data filed at 11/23/12 0900  Gross per 24 hour  Intake    370 ml  Output      0 ml  Net    370 ml   Exam: General: No acute respiratory distress at rest  Lungs: Clear to auscultation bilaterally without wheezes or crackles Cardiovascular: Regular rate and rhythm without gallop or rub normal S1 and S2 Abdomen: Nontender, nondistended, soft, bowel sounds positive, no rebound, no ascites, no appreciable mass Extremities: No significant cyanosis, clubbing, edema bilateral lower extremities  Data Reviewed: Basic Metabolic Panel:  Recent Labs Lab 11/20/12 1339 11/21/12 0507 11/22/12 0720  NA 130* 132*  133*  K 4.7 4.3 3.3*  CL 86* 93* 94*  CO2 20 18* 23  GLUCOSE 206* 116* 101*  BUN 76* 84* 35*  CREATININE 6.73* 7.06* 3.76*  CALCIUM 9.8 8.8 8.5  PHOS  --  5.5*  --    Liver Function Tests:  Recent Labs Lab 11/20/12 1339 11/22/12 0720  AST 78* 123*  ALT 42* 69*  ALKPHOS 133* 102  BILITOT 0.7 0.3  PROT 8.3 6.1  ALBUMIN 3.5 2.3*    Recent Labs Lab 11/20/12 1339  LIPASE 15   CBC:  Recent Labs Lab 11/20/12 1339 11/21/12 0507 11/22/12 0720  WBC 13.3* 16.6* 11.2*  NEUTROABS 12.4*  --   --   HGB 11.6* 10.2* 9.9*   HCT 34.8* 30.6* 28.8*  MCV 101.8* 101.7* 100.0  PLT 150 104* 98*   CBG:  Recent Labs Lab 11/21/12 2101 11/22/12 0857 11/22/12 1256 11/22/12 1753 11/23/12 0735  GLUCAP 82 88 173* 146* 129*    Recent Results (from the past 240 hour(s))  CULTURE, BLOOD (ROUTINE X 2)     Status: None   Collection Time    11/20/12  1:39 PM      Result Value Range Status   Specimen Description BLOOD RIGHT ARM   Final   Special Requests BOTTLES DRAWN AEROBIC AND ANAEROBIC 4CC   Final   Culture  Setup Time     Final   Value: 11/20/2012 17:58     Performed at [facility]   Culture     Final   Value: STAPHYLOCOCCUS AUREUS     Note: RIFAMPIN AND GENTAMICIN SHOULD NOT BE USED AS SINGLE DRUGS FOR TREATMENT OF STAPH INFECTIONS.     Note: Gram Stain Report Called to,Read Back By and Verified With: Bonnee Quin RN on 11/21/12 at 03:10 by Christie Nottingham     Performed at [facility]   Report Status 11/23/2012 FINAL   Final   Organism ID, Bacteria STAPHYLOCOCCUS AUREUS   Final  CULTURE, BLOOD (ROUTINE X 2)     Status: None   Collection Time    11/20/12  2:19 PM      Result Value Range Status   Specimen Description BLOOD RIGHT HAND   Final   Special Requests BOTTLES DRAWN AEROBIC AND ANAEROBIC 10CC   Final   Culture  Setup Time     Final   Value: 11/20/2012 17:58     Performed at [facility]   Culture     Final   Value: STAPHYLOCOCCUS AUREUS     Note: SUSCEPTIBILITIES PERFORMED ON PREVIOUS CULTURE WITHIN THE LAST 5 DAYS.     Note: Gram Stain Report Called to,Read Back By and Verified With: Bonnee Quin RN on 11/21/12 at 03:10 by Christie Nottingham     Performed at [facility]   Report Status 11/23/2012 FINAL   Final  URINE CULTURE     Status: None   Collection Time    11/20/12  3:05 PM      Result Value Range Status   Specimen Description URINE, CATHETERIZED   Final   Special Requests NONE   Final   Culture  Setup Time     Final   Value: 11/20/2012 20:00     Performed at  [facility]   Culture     Final   Value: 50,000 COLONIES/mL LACTOBACILLUS SPECIES     Note: Standardized susceptibility testing for this organism is not available.     Performed at [facility]   Report Status 11/21/2012 FINAL   Final  MRSA PCR SCREENING     Status: Abnormal   Collection Time    11/20/12  5:52 PM      Result Value Range Status   MRSA by PCR POSITIVE (*) NEGATIVE Final   Comment:            The GeneXpert MRSA Assay (FDA     approved for NASAL specimens     only), is one component of a     comprehensive MRSA colonization     surveillance program. It is not     intended to diagnose MRSA     infection nor to guide or     monitor treatment for     MRSA infections.     RESULT CALLED TO, READ BACK BY AND VERIFIED WITH:     GRACOU,R RN 1949 11/20/12 MITCHELL,L  CLOSTRIDIUM DIFFICILE BY PCR     Status: Abnormal   Collection Time    11/20/12  8:27 PM      Result Value Range Status   C difficile by pcr POSITIVE (*) NEGATIVE Final   Comment: CRITICAL RESULT CALLED TO, READ BACK BY AND VERIFIED WITH:     PETTIFORD A RN 11/21/12 0805 COSTELLO B  CULTURE, BLOOD (ROUTINE X 2)     Status: None   Collection Time    11/22/12 12:20 PM      Result Value Range Status   Specimen Description BLOOD RIGHT HAND   Final   Special Requests BOTTLES DRAWN AEROBIC AND ANAEROBIC 10CC   Final   Culture  Setup Time     Final   Value: 11/22/2012 18:09     Performed at [facility]   Culture     Final   Value:        BLOOD CULTURE RECEIVED NO GROWTH TO DATE CULTURE WILL BE HELD FOR 5 DAYS BEFORE ISSUING A FINAL NEGATIVE REPORT     Performed at [facility]   Report Status PENDING   Incomplete  CULTURE, BLOOD (ROUTINE X 2)     Status: None   Collection Time    11/22/12 12:32 PM      Result Value Range Status   Specimen Description BLOOD RIGHT HAND   Final   Special Requests BOTTLES DRAWN AEROBIC AND ANAEROBIC 10CC   Final   Culture  Setup Time     Final    Value: 11/22/2012 18:10     Performed at [facility]   Culture     Final   Value:        BLOOD CULTURE RECEIVED NO GROWTH TO DATE CULTURE WILL BE HELD FOR 5 DAYS BEFORE ISSUING A FINAL NEGATIVE REPORT     Performed at [facility]   Report Status PENDING   Incomplete     Studies:  Recent x-ray studies have been reviewed in detail by the Attending Physician  Scheduled Meds:  Scheduled Meds: . aspirin EC  81 mg Oral Daily  . calcium carbonate  1 tablet Oral TID WC  .  ceFAZolin (ANCEF) IV  2 g Intravenous Q M,W,F-2000  . Chlorhexidine Gluconate Cloth  6 each Topical Q0600  . darbepoetin  25 mcg Intravenous Q Mon-HD  . doxercalciferol  4 mcg Intravenous Q M,W,F-HD  . folic acid  0.5 mg Oral Daily  . gentamicin  60 mg Intravenous Q M,W,F-HD  . insulin aspart  0-9 Units Subcutaneous TID WC  .  levothyroxine  75 mcg Oral QAC breakfast  . metoprolol  75 mg Oral BID  . multivitamin  1 tablet Oral QHS  . mupirocin ointment  1 application Nasal BID  . rifampin (RIFADIN) IVPB  300 mg Intravenous Q8H  . sodium chloride  3 mL Intravenous Q12H  . vancomycin  500 mg Oral Q6H  . vitamin C  500 mg Oral Daily    Time spent on care of this patient: 35 mins   Vinita Prentiss T, MD  [facility] Office  [phone] Pager - Text Page per Loretha Stapler as per below:  On-Call/Text Page:      [email or web address]      password [email or web address]  If 7PM-7AM, please contact night-coverage [email or web address] Password [email or web address] 11/23/2012, 12:45 PM   LOS: 3 days

## 2012-11-23 NOTE — Progress Notes (Signed)
      [place] Antimicrobial Management Team Staphylococcus aureus bacteremia   Staphylococcus aureus bacteremia (SAB) is associated with a high rate of complications and mortality.  Specific aspects of clinical management are critical to optimizing the outcome of patients with SAB.  Therefore, the [facility] Antimicrobial Management Team [facility]) has initiated an intervention aimed at improving the management of SAB at [facility].  To do so, Infectious Diseases physicians are providing an evidence-based consult for the management of all patients with SAB.     Yes No Comments  Perform follow-up blood cultures (even if the patient is afebrile) to ensure clearance of bacteremia [x]  []  Ordered on 11/18  Remove vascular catheter and obtain follow-up blood cultures after the removal of the catheter []  []    Perform echocardiography to evaluate for endocarditis (transthoracic ECHO is 40-50% sensitive, TEE is > 90% sensitive) Consulting cardiology for TEE []  []  Please keep in mind, that neither test can definitively EXCLUDE endocarditis, and that should clinical suspicion remain high for endocarditis the patient should then still be treated with an "endocarditis" duration of therapy = 6 weeks  Consult electrophysiologist to evaluate implanted cardiac device (pacemaker, ICD) []  [x]  The patient does not have an ICD/pacemaker  Ensure source control Evaluating AVF as source of infection [x]  []  Have all abscesses been drained effectively? Have deep seeded infections (septic joints or osteomyelitis) had appropriate surgical debridement?  Investigate for "metastatic" sites of infection []  []  Does the patient have ANY symptom or physical exam finding that would suggest a deeper infection (back or neck pain that may be suggestive of vertebral osteomyelitis or epidural abscess, muscle pain that could be a symptom of pyomyositis)?  Keep in mind that for deep seeded infections MRI imaging with contrast is preferred  rather than other often insensitive tests such as plain x-rays, especially early in a patient's presentation.  Change antibiotic therapy to Ancef [x]  []  Beta-lactam antibiotics are preferred for MSSA due to higher cure rates.   If on Vancomycin, goal trough should be 15 - 20 mcg/mL  Estimated duration of IV antibiotic therapy:   []  []  Consult case management for probably prolonged outpatient IV antibiotic therapy   Georgina Pillion, PharmD, BCPS Clinical Pharmacist Pager: [phone] 11/23/2012 12:01 PM

## 2012-11-23 NOTE — Progress Notes (Signed)
AGre and See my separate note

## 2012-11-23 NOTE — Progress Notes (Signed)
[facility] Progress Note  Subjective:   Feels bad. Significant nausea today. 2D echo negative for vegetation  Objective Filed Vitals:   11/23/12 0016 11/23/12 0500 11/23/12 0737 11/23/12 1249  BP: 134/43 135/48 182/66 143/72  Pulse: 65 66 76 72  Temp: 97.8 F (36.6 C) 97.8 F (36.6 C) 97.4 F (36.3 C) 97.8 F (36.6 C)  TempSrc: Oral  Oral Oral  Resp: 27 22 24 18   Height:      Weight:      SpO2: 100% 99% 93% 93%   Physical Exam General: Frail, weak, cooperative, NAD Heart: RRR, prosthetic valve click, soft murmur. No rub appreciated Lungs: CTA bilat, no wheezes or rhonchi Abdomen: Soft, diffusely tender to deep palp, + BS Extremities: No LE edema, SCDs present Dialysis Access: L AVF + bruit  Dialysis orders: MWF at [facility]  3hrs 50.5kg 2K/2.25Ca 400/800 L arm AVF Heparin 4200  Hectorol 4ug Epo 1800 Venofer none  Assessment/Plan: 1. Diarrhea / C diff positive: PO vanc, mgmt per primary 2. MSSA bacteremia: TTE negative for vegetation. Per ID, now on cefazolin, gentamicin and rifampin given concern for endocarditis. TEE pending.  VVS has evaluated L AVF and found no evidence of infection, advised rotation of cannulation sites. 3. Nausea: scheduled antiemetics 4. ESRD: HD today with added K+ bath. 5. Anemia of CKD: Hb 9.9, op tsat 40%, cont esa w darbe 25 on wed 6. MBD (metabolic bone disease)- P 5.5, op pth 216, cont vit D and Tums                                                    7. HTN/volume - SBPs 140s on metoprolol. 3kg up, UF to dry wt 8. Nutrition: On full liquids, nepro, multivitamin 9. DM2 10. CABG/AVR 2009    Karen E. Thad Ranger [facility] Pager [phone] 11/23/2012,1:01 PM  LOS: 3 days   I have seen and examined patient, discussed with PA and agree with assessment and plan as outlined above. Vinson Moselle MD pager [phone]    cell [phone] 11/23/2012, 3:27 PM    Additional Objective Labs: Basic Metabolic  Panel:  Recent Labs Lab 11/20/12 1339 11/21/12 0507 11/22/12 0720  NA 130* 132* 133*  K 4.7 4.3 3.3*  CL 86* 93* 94*  CO2 20 18* 23  GLUCOSE 206* 116* 101*  BUN 76* 84* 35*  CREATININE 6.73* 7.06* 3.76*  CALCIUM 9.8 8.8 8.5  PHOS  --  5.5*  --    Liver Function Tests:  Recent Labs Lab 11/20/12 1339 11/22/12 0720  AST 78* 123*  ALT 42* 69*  ALKPHOS 133* 102  BILITOT 0.7 0.3  PROT 8.3 6.1  ALBUMIN 3.5 2.3*    Recent Labs Lab 11/20/12 1339  LIPASE 15   CBC:  Recent Labs Lab 11/20/12 1339 11/21/12 0507 11/22/12 0720  WBC 13.3* 16.6* 11.2*  NEUTROABS 12.4*  --   --   HGB 11.6* 10.2* 9.9*  HCT 34.8* 30.6* 28.8*  MCV 101.8* 101.7* 100.0  PLT 150 104* 98*   Blood Culture    Component Value Date/Time   SDES BLOOD RIGHT HAND 11/22/2012 1232   SPECREQUEST BOTTLES DRAWN AEROBIC AND ANAEROBIC 10CC 11/22/2012 1232   CULT  Value:        BLOOD CULTURE RECEIVED NO GROWTH TO DATE CULTURE WILL BE HELD  FOR 5 DAYS BEFORE ISSUING A FINAL NEGATIVE REPORT Performed at [facility] 11/22/2012 1232   REPTSTATUS PENDING 11/22/2012 1232    CBG:  Recent Labs Lab 11/21/12 2101 11/22/12 0857 11/22/12 1256 11/22/12 1753 11/23/12 0735  GLUCAP 82 88 173* 146* 129*   Studies/Results: No results found. Medications: . sodium chloride 10 mL/hr (11/21/12 2245)   . aspirin EC  81 mg Oral Daily  . calcium carbonate  1 tablet Oral TID WC  .  ceFAZolin (ANCEF) IV  2 g Intravenous Q M,W,F-2000  . Chlorhexidine Gluconate Cloth  6 each Topical Q0600  . darbepoetin  25 mcg Intravenous Q Mon-HD  . doxercalciferol  4 mcg Intravenous Q M,W,F-HD  . folic acid  0.5 mg Oral Daily  . gentamicin  60 mg Intravenous Q M,W,F-HD  . insulin aspart  0-9 Units Subcutaneous TID WC  . levothyroxine  75 mcg Oral QAC breakfast  . metoprolol  75 mg Oral BID  . multivitamin  1 tablet Oral QHS  . mupirocin ointment  1 application Nasal BID  . rifampin (RIFADIN) IVPB  300 mg Intravenous Q8H   . sodium chloride  3 mL Intravenous Q12H  . vancomycin  500 mg Oral Q6H  . vitamin C  500 mg Oral Daily

## 2012-11-24 DIAGNOSIS — Z952 Presence of prosthetic heart valve: Secondary | ICD-10-CM

## 2012-11-24 LAB — GLUCOSE, CAPILLARY
Glucose-Capillary: 123 mg/dL — ABNORMAL HIGH (ref 70–99)
Glucose-Capillary: 205 mg/dL — ABNORMAL HIGH (ref 70–99)
Glucose-Capillary: 165 mg/dL — ABNORMAL HIGH (ref 70–99)

## 2012-11-24 LAB — HEPATITIS B E ANTIGEN: Hep B E Ag: NEGATIVE

## 2012-11-24 NOTE — Progress Notes (Signed)
[facility] Progress Note  Subjective:   3 loose stools overnight, on bedpan now, o/w no complaints  Objective Filed Vitals:   11/23/12 1900 11/24/12 0013 11/24/12 0429 11/24/12 0717  BP: 156/66 132/49 167/50 167/56  Pulse: 81 81 81 81  Temp: 97.6 F (36.4 C) 99.2 F (37.3 C) 98.2 F (36.8 C) 98.3 F (36.8 C)  TempSrc: Oral Oral Oral Oral  Resp: 26 25 28 26   Height:      Weight: 52.3 kg (115 lb 4.8 oz)  51.7 kg (113 lb 15.7 oz)   SpO2: 93% 92% 92% 93%   Physical Exam General: Frail, weak, cooperative, NAD Heart: RRR, prosthetic valve click, soft murmur. No rub appreciated Lungs: CTA bilat, no wheezes or rhonchi Abdomen: Soft, diffusely tender to deep palp, + BS Extremities: No LE edema, SCDs present Dialysis Access: L AVF + bruit  Dialysis orders: MWF at [facility]  3hrs 50.5kg 2K/2.25Ca 400/800 L arm AVF Heparin 4200  Hectorol 4ug Epo 1800 Venofer none  Assessment/Plan: 1. Diarrhea / C diff positive: PO vanc, mgmt per primary 2. MSSA bacteremia / hx prosthetic AVR: TTE negative for vegetation, on ancef/gent/rifampin per ID. TEE pending.  VVS evaluated L AVF and found no evidence of infection, advised rotation of cannulation sites 3. ESRD: HD friday 4. Anemia of CKD: Hb 9.9, op tsat 40%, cont esa w darbe 25 on wed 5. MBD (metabolic bone disease)- P 5.5, op pth 216, cont vit D and Tums                                                    6. HTN/volume - sbp 140's, up 1 kg, prob losing wt, UF 2 kg at hd fri 7. Nutrition: On full liquids, nepro, multivitamin 8. DM2 9. CABG/AVR 2009  Vinson Moselle MD pager [phone]    cell [phone] 11/24/2012, 11:47 AM    Additional Objective Labs: Basic Metabolic Panel:  Recent Labs Lab 11/20/12 1339 11/21/12 0507 11/22/12 0720  NA 130* 132* 133*  K 4.7 4.3 3.3*  CL 86* 93* 94*  CO2 20 18* 23  GLUCOSE 206* 116* 101*  BUN 76* 84* 35*  CREATININE 6.73* 7.06* 3.76*  CALCIUM 9.8 8.8 8.5  PHOS  --  5.5*  --     Liver Function Tests:  Recent Labs Lab 11/20/12 1339 11/22/12 0720  AST 78* 123*  ALT 42* 69*  ALKPHOS 133* 102  BILITOT 0.7 0.3  PROT 8.3 6.1  ALBUMIN 3.5 2.3*    Recent Labs Lab 11/20/12 1339  LIPASE 15   CBC:  Recent Labs Lab 11/20/12 1339 11/21/12 0507 11/22/12 0720  WBC 13.3* 16.6* 11.2*  NEUTROABS 12.4*  --   --   HGB 11.6* 10.2* 9.9*  HCT 34.8* 30.6* 28.8*  MCV 101.8* 101.7* 100.0  PLT 150 104* 98*   Blood Culture    Component Value Date/Time   SDES BLOOD RIGHT HAND 11/22/2012 1232   SPECREQUEST BOTTLES DRAWN AEROBIC AND ANAEROBIC 10CC 11/22/2012 1232   CULT  Value:        BLOOD CULTURE RECEIVED NO GROWTH TO DATE CULTURE WILL BE HELD FOR 5 DAYS BEFORE ISSUING A FINAL NEGATIVE REPORT Performed at [facility] 11/22/2012 1232   REPTSTATUS PENDING 11/22/2012 1232    CBG:  Recent Labs Lab 11/22/12 1753 11/23/12 0735 11/23/12 1250  11/23/12 2038 11/24/12 0726  GLUCAP 146* 129* 158* 138* 123*   Studies/Results: No results found. Medications: . sodium chloride 10 mL/hr (11/21/12 2245)   . aspirin EC  81 mg Oral Daily  . calcium carbonate  1 tablet Oral TID WC  .  ceFAZolin (ANCEF) IV  2 g Intravenous Q M,W,F-2000  . Chlorhexidine Gluconate Cloth  6 each Topical Q0600  . darbepoetin  25 mcg Intravenous Q Mon-HD  . doxercalciferol  4 mcg Intravenous Q M,W,F-HD  . feeding supplement (NEPRO CARB STEADY)  237 mL Oral BID BM  . folic acid  0.5 mg Oral Daily  . gentamicin  60 mg Intravenous Q M,W,F-HD  . heparin  4,200 Units Dialysis Once in dialysis  . insulin aspart  0-9 Units Subcutaneous TID WC  . levothyroxine  75 mcg Oral QAC breakfast  . metoprolol  75 mg Oral BID  . multivitamin  1 tablet Oral QHS  . mupirocin ointment  1 application Nasal BID  . rifampin (RIFADIN) IVPB  300 mg Intravenous Q8H  . sodium chloride  3 mL Intravenous Q12H  . vancomycin  500 mg Oral Q6H  . vitamin C  500 mg Oral Daily

## 2012-11-24 NOTE — Progress Notes (Signed)
INFECTIOUS DISEASE PROGRESS NOTE  ID: [name] is a 78 y.o. female with  Principal Problem:   Sepsis Active Problems:   Type II or unspecified type diabetes mellitus with renal manifestations, not stated as uncontrolled(250.40)   HYPERTENSION   PULMONARY HYPERTENSION   GERD   S/P aortic valve replacement   End stage renal disease   Diarrhea   UTI (lower urinary tract infection)  Subjective: Without complaints. 3 loose BM per RN this AM.  Ate ice cream this AM.   Abtx:  Anti-infectives   Start     Dose/Rate Route Frequency Ordered Stop   11/23/12 2000  ceFAZolin (ANCEF) IVPB 1 g/50 mL premix  Status:  Discontinued     1 g 100 mL/hr over 30 Minutes Intravenous Every M-W-F (2000) 11/22/12 1138 11/22/12 1139   11/23/12 2000  ceFAZolin (ANCEF) IVPB 2 g/50 mL premix     2 g 100 mL/hr over 30 Minutes Intravenous Every M-W-F (2000) 11/22/12 1139     11/23/12 1600  gentamicin (GARAMYCIN) IVPB 60 mg     60 mg 100 mL/hr over 30 Minutes Intravenous Every M-W-F (Hemodialysis) 11/22/12 1526     11/22/12 1530  gentamicin (GARAMYCIN) IVPB 80 mg     80 mg 100 mL/hr over 30 Minutes Intravenous NOW 11/22/12 1524 11/22/12 1655   11/22/12 1500  rifampin (RIFADIN) 300 mg in sodium chloride 0.9 % 100 mL IVPB     300 mg 200 mL/hr over 30 Minutes Intravenous 3 times per day 11/22/12 1359     11/22/12 1200  vancomycin (VANCOCIN) IVPB 1000 mg/200 mL premix     1,000 mg 200 mL/hr over 60 Minutes Intravenous STAT 11/22/12 1114 11/22/12 1347   11/22/12 1200  ceFAZolin (ANCEF) IVPB 2 g/50 mL premix     2 g 100 mL/hr over 30 Minutes Intravenous STAT 11/22/12 1138 11/22/12 1316   11/21/12 1200  vancomycin (VANCOCIN) 500 mg in sodium chloride 0.9 % 100 mL IVPB  Status:  Discontinued     500 mg 100 mL/hr over 60 Minutes Intravenous Every M-W-F (Hemodialysis) 11/21/12 0928 11/23/12 0909   11/21/12 0930  vancomycin (VANCOCIN) 1,250 mg in sodium chloride 0.9 % 250 mL IVPB     1,250 mg 166.7 mL/hr  over 90 Minutes Intravenous STAT 11/21/12 0927 11/22/12 0930   11/20/12 1800  cefTRIAXone (ROCEPHIN) 1 g in dextrose 5 % 50 mL IVPB  Status:  Discontinued     1 g 100 mL/hr over 30 Minutes Intravenous Every 24 hours 11/20/12 1756 11/22/12 1120   11/20/12 1800  vancomycin (VANCOCIN) 50 mg/mL oral solution 500 mg     500 mg Oral 4 times per day 11/20/12 1756 12/04/12 1759   11/20/12 1756  metroNIDAZOLE (FLAGYL) IVPB 500 mg  Status:  Discontinued     500 mg 100 mL/hr over 60 Minutes Intravenous Every 8 hours 11/20/12 1756 11/22/12 1400   11/20/12 1545  metroNIDAZOLE (FLAGYL) IVPB 500 mg     500 mg 100 mL/hr over 60 Minutes Intravenous  Once 11/20/12 1535 11/20/12 1725   11/20/12 1545  cefTRIAXone (ROCEPHIN) 1 g in dextrose 5 % 50 mL IVPB     1 g 100 mL/hr over 30 Minutes Intravenous  Once 11/20/12 1538 11/20/12 1724      Medications:  Scheduled: . aspirin EC  81 mg Oral Daily  . calcium carbonate  1 tablet Oral TID WC  .  ceFAZolin (ANCEF) IV  2 g Intravenous Q M,W,F-2000  .  Chlorhexidine Gluconate Cloth  6 each Topical Q0600  . darbepoetin  25 mcg Intravenous Q Mon-HD  . doxercalciferol  4 mcg Intravenous Q M,W,F-HD  . feeding supplement (NEPRO CARB STEADY)  237 mL Oral BID BM  . folic acid  0.5 mg Oral Daily  . gentamicin  60 mg Intravenous Q M,W,F-HD  . heparin  4,200 Units Dialysis Once in dialysis  . insulin aspart  0-9 Units Subcutaneous TID WC  . levothyroxine  75 mcg Oral QAC breakfast  . metoprolol  75 mg Oral BID  . multivitamin  1 tablet Oral QHS  . mupirocin ointment  1 application Nasal BID  . rifampin (RIFADIN) IVPB  300 mg Intravenous Q8H  . sodium chloride  3 mL Intravenous Q12H  . vancomycin  500 mg Oral Q6H  . vitamin C  500 mg Oral Daily    Objective: Vital signs in last 24 hours: Temp:  [97.6 F (36.4 C)-99.2 F (37.3 C)] 98.3 F (36.8 C) (11/20 0717) Pulse Rate:  [70-81] 81 (11/20 0717) Resp:  [18-30] 26 (11/20 0717) BP: (114-167)/(48-78) 167/56 mmHg  (11/20 0717) SpO2:  [92 %-95 %] 93 % (11/20 0717) Weight:  [51.7 kg (113 lb 15.7 oz)-55.1 kg (121 lb 7.6 oz)] 51.7 kg (113 lb 15.7 oz) (11/20 0429)   General appearance: alert, cooperative and no distress Resp: clear to auscultation bilaterally Cardio: regular rate and rhythm and systolic murmur: early systolic 2/6, crescendo at 2nd left intercostal space GI: normal findings: bowel sounds normal and soft, non-tender Extremities: LUE AVF + thrill  Lab Results  Recent Labs  11/22/12 0720  WBC 11.2*  HGB 9.9*  HCT 28.8*  NA 133*  K 3.3*  CL 94*  CO2 23  BUN 35*  CREATININE 3.76*   Liver Panel  Recent Labs  11/22/12 0720  PROT 6.1  ALBUMIN 2.3*  AST 123*  ALT 69*  ALKPHOS 102  BILITOT 0.3   Sedimentation Rate No results found for this basename: ESRSEDRATE,  in the last 72 hours C-Reactive Protein No results found for this basename: CRP,  in the last 72 hours  Microbiology: Recent Results (from the past 240 hour(s))  CULTURE, BLOOD (ROUTINE X 2)     Status: None   Collection Time    11/20/12  1:39 PM      Result Value Range Status   Specimen Description BLOOD RIGHT ARM   Final   Special Requests BOTTLES DRAWN AEROBIC AND ANAEROBIC 4CC   Final   Culture  Setup Time     Final   Value: 11/20/2012 17:58     Performed at [facility]   Culture     Final   Value: STAPHYLOCOCCUS AUREUS     Note: RIFAMPIN AND GENTAMICIN SHOULD NOT BE USED AS SINGLE DRUGS FOR TREATMENT OF STAPH INFECTIONS.     Note: Gram Stain Report Called to,Read Back By and Verified With: Bonnee Quin RN on 11/21/12 at 03:10 by Christie Nottingham     Performed at [facility]   Report Status 11/23/2012 FINAL   Final   Organism ID, Bacteria STAPHYLOCOCCUS AUREUS   Final  CULTURE, BLOOD (ROUTINE X 2)     Status: None   Collection Time    11/20/12  2:19 PM      Result Value Range Status   Specimen Description BLOOD RIGHT HAND   Final   Special Requests BOTTLES DRAWN AEROBIC AND ANAEROBIC  10CC   Final   Culture  Setup Time  Final   Value: 11/20/2012 17:58     Performed at [facility]   Culture     Final   Value: STAPHYLOCOCCUS AUREUS     Note: SUSCEPTIBILITIES PERFORMED ON PREVIOUS CULTURE WITHIN THE LAST 5 DAYS.     Note: Gram Stain Report Called to,Read Back By and Verified With: Bonnee Quin RN on 11/21/12 at 03:10 by Christie Nottingham     Performed at [facility]   Report Status 11/23/2012 FINAL   Final  URINE CULTURE     Status: None   Collection Time    11/20/12  3:05 PM      Result Value Range Status   Specimen Description URINE, CATHETERIZED   Final   Special Requests NONE   Final   Culture  Setup Time     Final   Value: 11/20/2012 20:00     Performed at [facility]   Culture     Final   Value: 50,000 COLONIES/mL LACTOBACILLUS SPECIES     Note: Standardized susceptibility testing for this organism is not available.     Performed at [facility]   Report Status 11/21/2012 FINAL   Final  MRSA PCR SCREENING     Status: Abnormal   Collection Time    11/20/12  5:52 PM      Result Value Range Status   MRSA by PCR POSITIVE (*) NEGATIVE Final   Comment:            The GeneXpert MRSA Assay (FDA     approved for NASAL specimens     only), is one component of a     comprehensive MRSA colonization     surveillance program. It is not     intended to diagnose MRSA     infection nor to guide or     monitor treatment for     MRSA infections.     RESULT CALLED TO, READ BACK BY AND VERIFIED WITH:     GRACOU,R RN 1949 11/20/12 MITCHELL,L  CLOSTRIDIUM DIFFICILE BY PCR     Status: Abnormal   Collection Time    11/20/12  8:27 PM      Result Value Range Status   C difficile by pcr POSITIVE (*) NEGATIVE Final   Comment: CRITICAL RESULT CALLED TO, READ BACK BY AND VERIFIED WITH:     PETTIFORD A RN 11/21/12 0805 COSTELLO B  CULTURE, BLOOD (ROUTINE X 2)     Status: None   Collection Time    11/22/12 12:20 PM      Result Value Range Status     Specimen Description BLOOD RIGHT HAND   Final   Special Requests BOTTLES DRAWN AEROBIC AND ANAEROBIC 10CC   Final   Culture  Setup Time     Final   Value: 11/22/2012 18:09     Performed at [facility]   Culture     Final   Value:        BLOOD CULTURE RECEIVED NO GROWTH TO DATE CULTURE WILL BE HELD FOR 5 DAYS BEFORE ISSUING A FINAL NEGATIVE REPORT     Performed at [facility]   Report Status PENDING   Incomplete  CULTURE, BLOOD (ROUTINE X 2)     Status: None   Collection Time    11/22/12 12:32 PM      Result Value Range Status   Specimen Description BLOOD RIGHT HAND   Final   Special Requests BOTTLES DRAWN AEROBIC AND ANAEROBIC 10CC  Final   Culture  Setup Time     Final   Value: 11/22/2012 18:10     Performed at [facility]   Culture     Final   Value:        BLOOD CULTURE RECEIVED NO GROWTH TO DATE CULTURE WILL BE HELD FOR 5 DAYS BEFORE ISSUING A FINAL NEGATIVE REPORT     Performed at [facility]   Report Status PENDING   Incomplete    Studies/Results: No results found.   Assessment/Plan: MSSA Bacteremia Prosthetic AV ESRD C diff+ 11-16 Hypokalemia  Total days of antibiotics: 5 Flagyl --> po vanco Ancef/rif/gent  Appears to be doing well Repeat BCx are (-) TTE (-) TEE pending         Johny Sax Infectious Diseases (pager) [phone] [email or web address] 11/24/2012, 9:24 AM  LOS: 4 days

## 2012-11-24 NOTE — Progress Notes (Signed)
[facility] Progress Note [place] TEAM 1 - Stepdown/ICU TEAM   EMNET MONK ZOX:[id number] DOB: 1920-04-08 DOA: 11/20/2012 PCP: Tillman Abide, MD  Admit HPI / Brief Narrative: 79 y.o. female with past medical history of end-stage renal disease, diabetes mellitus, CAD and prior C. difficile who despite her advanced age and medical problems is rather functional and lives alone with her son nearby. Patient had been in her usual state of health when she complained to her son about chills 3 days prior to admit. She went to dialysis and had a low-grade temp. Dialysis was done partially on that day. Patient visited her PCP and a chest x-ray was unremarkable. She spent the weekend with her son, and was brought to the ED when she was noted to be progressively weaker with diarrhea.  In the emergency room the patient was noted to have a lactic acid level 11.9, anion gap of 24 and a glucose of 206. Her white count was 13.2-92% shift and her urine was positive for UTI. Stool studies were sent for C. difficile as was urine culture and patient was started on IV Rocephin and Flagyl.   Assessment/Plan:  Sepsis Due to UTI, Staph bacteremia, and C diff - sepsis physiology resolved   MRSA bacteremia - 2/2 blood cx + Cont abx per ID - TTE w/o overt vegetations - to have TEE per ID   C diff colitis  Oral vanc - follow clinically - avoid PPI - will require prolonged tx due to need for concomitant abx  - advance diet  UTI Lactobacillus species w/ < 50, 000 colonies  ESRD on HD  Nephrology is following - dialysis access (fistula) does not appear to be infected - Vasc Surgery has seen and agrees   Anemia of CKD Follow hemoglobin trend - no evidence of acute blood loss  CAD w/ Hx CABG Denies chest pain  DM2 CBG reasonably controlled at the present time - follow w/o change today   HTN Cont to follow w/o change in tx plan today   Pulmonary HTN  GERD   S/P AoVR Does not require chronic  anticoagulation due to use of bioprosthetic valve - at risk for endocarditis  Code Status: NO CODE BLUE Family Communication: d/w 2 nieces at bedside Disposition Plan: SDU  Consultants: Nephrology  ID Vasc Surgery   Procedures: TTE   Antibiotics: Rocephin 11/16 >>11/17 Vanc 11/16 >> Flagyl 11/16 >>11/18 Rifampin 11/18 >> Gentamicin 11/18 >> Ancef 11/18>>  DVT prophylaxis: SCDs  HPI/Subjective: Diarrhea has resolved.  Tolerating full liquids. No nausea.   Objective: Blood pressure 154/54, pulse 75, temperature 98.2 F (36.8 C), temperature source Oral, resp. rate 24, height 5\' 2"  (1.575 m), weight 51.7 kg (113 lb 15.7 oz), SpO2 94.00%.  Intake/Output Summary (Last 24 hours) at 11/24/12 1627 Last data filed at 11/24/12 1400  Gross per 24 hour  Intake    910 ml  Output   1500 ml  Net   -590 ml   Exam: General: No acute respiratory distress at rest  Lungs: Clear to auscultation bilaterally without wheezes or crackles Cardiovascular: Regular rate and rhythm without gallop or rub normal S1 and S2 Abdomen: Nontender, nondistended, soft, bowel sounds positive, no rebound, no ascites, no appreciable mass Extremities: No significant cyanosis, clubbing, edema bilateral lower extremities  Data Reviewed: Basic Metabolic Panel:  Recent Labs Lab 11/20/12 1339 11/21/12 0507 11/22/12 0720  NA 130* 132* 133*  K 4.7 4.3 3.3*  CL 86* 93* 94*  CO2 20  18* 23  GLUCOSE 206* 116* 101*  BUN 76* 84* 35*  CREATININE 6.73* 7.06* 3.76*  CALCIUM 9.8 8.8 8.5  PHOS  --  5.5*  --    Liver Function Tests:  Recent Labs Lab 11/20/12 1339 11/22/12 0720  AST 78* 123*  ALT 42* 69*  ALKPHOS 133* 102  BILITOT 0.7 0.3  PROT 8.3 6.1  ALBUMIN 3.5 2.3*    Recent Labs Lab 11/20/12 1339  LIPASE 15   CBC:  Recent Labs Lab 11/20/12 1339 11/21/12 0507 11/22/12 0720  WBC 13.3* 16.6* 11.2*  NEUTROABS 12.4*  --   --   HGB 11.6* 10.2* 9.9*  HCT 34.8* 30.6* 28.8*  MCV 101.8*  101.7* 100.0  PLT 150 104* 98*   CBG:  Recent Labs Lab 11/23/12 0735 11/23/12 1250 11/23/12 2038 11/24/12 0726 11/24/12 1224  GLUCAP 129* 158* 138* 123* 205*    Recent Results (from the past 240 hour(s))  CULTURE, BLOOD (ROUTINE X 2)     Status: None   Collection Time    11/20/12  1:39 PM      Result Value Range Status   Specimen Description BLOOD RIGHT ARM   Final   Special Requests BOTTLES DRAWN AEROBIC AND ANAEROBIC 4CC   Final   Culture  Setup Time     Final   Value: 11/20/2012 17:58     Performed at [facility]   Culture     Final   Value: STAPHYLOCOCCUS AUREUS     Note: RIFAMPIN AND GENTAMICIN SHOULD NOT BE USED AS SINGLE DRUGS FOR TREATMENT OF STAPH INFECTIONS.     Note: Gram Stain Report Called to,Read Back By and Verified With: Bonnee Quin RN on 11/21/12 at 03:10 by Christie Nottingham     Performed at [facility]   Report Status 11/23/2012 FINAL   Final   Organism ID, Bacteria STAPHYLOCOCCUS AUREUS   Final  CULTURE, BLOOD (ROUTINE X 2)     Status: None   Collection Time    11/20/12  2:19 PM      Result Value Range Status   Specimen Description BLOOD RIGHT HAND   Final   Special Requests BOTTLES DRAWN AEROBIC AND ANAEROBIC 10CC   Final   Culture  Setup Time     Final   Value: 11/20/2012 17:58     Performed at [facility]   Culture     Final   Value: STAPHYLOCOCCUS AUREUS     Note: SUSCEPTIBILITIES PERFORMED ON PREVIOUS CULTURE WITHIN THE LAST 5 DAYS.     Note: Gram Stain Report Called to,Read Back By and Verified With: Bonnee Quin RN on 11/21/12 at 03:10 by Christie Nottingham     Performed at [facility]   Report Status 11/23/2012 FINAL   Final  URINE CULTURE     Status: None   Collection Time    11/20/12  3:05 PM      Result Value Range Status   Specimen Description URINE, CATHETERIZED   Final   Special Requests NONE   Final   Culture  Setup Time     Final   Value: 11/20/2012 20:00     Performed at [facility]   Culture      Final   Value: 50,000 COLONIES/mL LACTOBACILLUS SPECIES     Note: Standardized susceptibility testing for this organism is not available.     Performed at [facility]   Report Status 11/21/2012 FINAL   Final  MRSA PCR  SCREENING     Status: Abnormal   Collection Time    11/20/12  5:52 PM      Result Value Range Status   MRSA by PCR POSITIVE (*) NEGATIVE Final   Comment:            The GeneXpert MRSA Assay (FDA     approved for NASAL specimens     only), is one component of a     comprehensive MRSA colonization     surveillance program. It is not     intended to diagnose MRSA     infection nor to guide or     monitor treatment for     MRSA infections.     RESULT CALLED TO, READ BACK BY AND VERIFIED WITH:     GRACOU,R RN 1949 11/20/12 MITCHELL,L  CLOSTRIDIUM DIFFICILE BY PCR     Status: Abnormal   Collection Time    11/20/12  8:27 PM      Result Value Range Status   C difficile by pcr POSITIVE (*) NEGATIVE Final   Comment: CRITICAL RESULT CALLED TO, READ BACK BY AND VERIFIED WITH:     PETTIFORD A RN 11/21/12 0805 COSTELLO B  CULTURE, BLOOD (ROUTINE X 2)     Status: None   Collection Time    11/22/12 12:20 PM      Result Value Range Status   Specimen Description BLOOD RIGHT HAND   Final   Special Requests BOTTLES DRAWN AEROBIC AND ANAEROBIC 10CC   Final   Culture  Setup Time     Final   Value: 11/22/2012 18:09     Performed at [facility]   Culture     Final   Value:        BLOOD CULTURE RECEIVED NO GROWTH TO DATE CULTURE WILL BE HELD FOR 5 DAYS BEFORE ISSUING A FINAL NEGATIVE REPORT     Performed at [facility]   Report Status PENDING   Incomplete  CULTURE, BLOOD (ROUTINE X 2)     Status: None   Collection Time    11/22/12 12:32 PM      Result Value Range Status   Specimen Description BLOOD RIGHT HAND   Final   Special Requests BOTTLES DRAWN AEROBIC AND ANAEROBIC 10CC   Final   Culture  Setup Time     Final   Value: 11/22/2012 18:10      Performed at [facility]   Culture     Final   Value:        BLOOD CULTURE RECEIVED NO GROWTH TO DATE CULTURE WILL BE HELD FOR 5 DAYS BEFORE ISSUING A FINAL NEGATIVE REPORT     Performed at [facility]   Report Status PENDING   Incomplete     Studies:  Recent x-ray studies have been reviewed in detail by the Attending Physician  Scheduled Meds:  Scheduled Meds: . aspirin EC  81 mg Oral Daily  . calcium carbonate  1 tablet Oral TID WC  .  ceFAZolin (ANCEF) IV  2 g Intravenous Q M,W,F-2000  . Chlorhexidine Gluconate Cloth  6 each Topical Q0600  . darbepoetin  25 mcg Intravenous Q Mon-HD  . doxercalciferol  4 mcg Intravenous Q M,W,F-HD  . feeding supplement (NEPRO CARB STEADY)  237 mL Oral BID BM  . folic acid  0.5 mg Oral Daily  . gentamicin  60 mg Intravenous Q M,W,F-HD  . heparin  4,200 Units Dialysis Once in dialysis  .  insulin aspart  0-9 Units Subcutaneous TID WC  . levothyroxine  75 mcg Oral QAC breakfast  . metoprolol  75 mg Oral BID  . multivitamin  1 tablet Oral QHS  . mupirocin ointment  1 application Nasal BID  . rifampin (RIFADIN) IVPB  300 mg Intravenous Q8H  . sodium chloride  3 mL Intravenous Q12H  . vancomycin  500 mg Oral Q6H  . vitamin C  500 mg Oral Daily    Time spent on care of this patient: 35 mins   Calvert Cantor, MD  [facility] Office  [phone] Pager - Text Page per Loretha Stapler as per below:  On-Call/Text Page:      [email or web address]      password [email or web address]  If 7PM-7AM, please contact night-coverage [email or web address] Password [email or web address] 11/24/2012, 4:27 PM   LOS: 4 days

## 2012-11-25 DIAGNOSIS — Z954 Presence of other heart-valve replacement: Secondary | ICD-10-CM

## 2012-11-25 DIAGNOSIS — D696 Thrombocytopenia, unspecified: Secondary | ICD-10-CM

## 2012-11-25 LAB — RENAL FUNCTION PANEL
Albumin: 2.4 g/dL — ABNORMAL LOW (ref 3.5–5.2)
BUN: 32 mg/dL — ABNORMAL HIGH (ref 6–23)
Calcium: 9.3 mg/dL (ref 8.4–10.5)
Creatinine, Ser: 3.81 mg/dL — ABNORMAL HIGH (ref 0.50–1.10)
Glucose, Bld: 157 mg/dL — ABNORMAL HIGH (ref 70–99)
Phosphorus: 2.8 mg/dL (ref 2.3–4.6)
Potassium: 3.9 mEq/L (ref 3.5–5.1)

## 2012-11-25 LAB — GLUCOSE, CAPILLARY
Glucose-Capillary: 112 mg/dL — ABNORMAL HIGH (ref 70–99)
Glucose-Capillary: 178 mg/dL — ABNORMAL HIGH (ref 70–99)
Glucose-Capillary: 208 mg/dL — ABNORMAL HIGH (ref 70–99)

## 2012-11-25 MED ORDER — HEPARIN SODIUM (PORCINE) 1000 UNIT/ML DIALYSIS
4200.0000 [IU] | Freq: Once | INTRAMUSCULAR | Status: AC
  Administered 2012-11-25: 4200 [IU] via INTRAVENOUS_CENTRAL

## 2012-11-25 MED ORDER — LIDOCAINE-PRILOCAINE 2.5-2.5 % EX CREA
1.0000 "application " | TOPICAL_CREAM | CUTANEOUS | Status: DC | PRN
  Filled 2012-11-25: qty 5

## 2012-11-25 MED ORDER — HEPARIN SODIUM (PORCINE) 1000 UNIT/ML DIALYSIS: 1000.0000 [IU] | INTRAMUSCULAR | Status: DC | PRN

## 2012-11-25 MED ORDER — NEPRO/CARBSTEADY PO LIQD: 237.0000 mL | ORAL | Status: DC | PRN

## 2012-11-25 MED ORDER — LIDOCAINE HCL (PF) 1 % IJ SOLN: 5.0000 mL | INTRAMUSCULAR | Status: DC | PRN

## 2012-11-25 MED ORDER — SODIUM CHLORIDE 0.9 % IV SOLN: 100.0000 mL | INTRAVENOUS | Status: DC | PRN

## 2012-11-25 MED ORDER — HEPARIN SODIUM (PORCINE) 1000 UNIT/ML DIALYSIS: 4200.0000 [IU] | Freq: Once | INTRAMUSCULAR | Status: DC

## 2012-11-25 MED ORDER — AMLODIPINE BESYLATE 5 MG PO TABS
5.0000 mg | ORAL_TABLET | Freq: Every evening | ORAL | Status: DC
  Administered 2012-11-25 – 2012-12-06 (×11): 5 mg via ORAL
  Filled 2012-11-25 (×13): qty 1

## 2012-11-25 MED ORDER — PENTAFLUOROPROP-TETRAFLUOROETH EX AERO: 1.0000 "application " | INHALATION_SPRAY | CUTANEOUS | Status: DC | PRN

## 2012-11-25 MED ORDER — ALTEPLASE 2 MG IJ SOLR: 2.0000 mg | Freq: Once | INTRAMUSCULAR | Status: DC | PRN

## 2012-11-25 MED ORDER — DOXERCALCIFEROL 4 MCG/2ML IV SOLN
INTRAVENOUS | Status: AC
  Administered 2012-11-25: 4 ug via INTRAVENOUS
  Filled 2012-11-25: qty 2

## 2012-11-25 NOTE — Procedures (Signed)
I was present at this dialysis session, have reviewed the session itself and made  appropriate changes   Vinson Moselle MD  pager [phone]    cell [phone]  11/25/2012, 9:20 AM

## 2012-11-25 NOTE — Progress Notes (Addendum)
1839:  MD responded, acknowledged nurse concerns and stated medical group will come and assess pt.  Nurse communicated this to pt and son.  Nurse will continue to monitor.  1829:  Nurse was called into pt room by son.  Son arrived on unit late evening and raised concerns that mom was not as active as he had seen previously.  Nurse assessed pt , pt denied pain or feeling malaise and stated she was simply tired and was unable to get comfortable in the bed.  Pt was turned and realigned in bed for comfort.  Pt assessment has not changed since post HD transport back to unit.  Nurse did communicate to MD that patient has RR trending in mid to upper 20's, previously experienced per epic doc flowsheet. Nurse comforted son and contacted MD for assessment as requested.  Nurse will continue to monitor pt.

## 2012-11-25 NOTE — Progress Notes (Signed)
[facility] Progress Note  Subjective:   3 loose stools overnight, on bedpan now, o/w no complaints  Objective Filed Vitals:   11/25/12 0735 11/25/12 0800 11/25/12 0830 11/25/12 0900  BP: 186/78 194/75 183/69 179/76  Pulse: 83 84 79 80  Temp:      TempSrc:      Resp:      Height:      Weight:      SpO2:       Physical Exam General: Frail, weak, cooperative, NAD Heart: RRR, prosthetic valve click, soft murmur. No rub appreciated Lungs: CTA bilat, no wheezes or rhonchi Abdomen: Soft, diffusely tender to deep palp, + BS Extremities: No LE edema, SCDs present Dialysis Access: L AVF + bruit  Dialysis orders: MWF at [facility]  3hrs 50.5kg 2K/2.25Ca 400/800 L arm AVF Heparin 4200  Hectorol 4ug Epo 1800 Venofer none  Assessment/Plan: 1. Diarrhea / C diff positive: PO vanc, mgmt per primary 2. MSSA bacteremia / hx prosthetic AVR: TTE negative for vegetation, on ancef/gent/rifampin per ID. TEE pending.  VVS evaluated L AVF and found no evidence of infection, advised rotation of cannulation sites 3. ESRD: HD today 4. Anemia of CKD: Hb 9.9, op tsat 40%, cont esa w darbe 25 on wed 5. MBD (metabolic bone disease)- P 5.5, op pth 216, cont vit D and Tums                                                    6. HTN/volume - bp's high, up 3kg, on metoprolol 75 bid as at home; add norvasc 5/d, dec volume w HD 7. Nutrition: On full liquids, nepro, multivitamin 8. DM2 9. CABG/AVR 2009  Vinson Moselle MD pager [phone]    cell [phone] 11/25/2012, 9:28 AM    Additional Objective Labs: Basic Metabolic Panel:  Recent Labs Lab 11/21/12 0507 11/22/12 0720 11/25/12 0808  NA 132* 133* 132*  K 4.3 3.3* 3.9  CL 93* 94* 93*  CO2 18* 23 22  GLUCOSE 116* 101* 157*  BUN 84* 35* 32*  CREATININE 7.06* 3.76* 3.81*  CALCIUM 8.8 8.5 9.3  PHOS 5.5*  --  2.8   Liver Function Tests:  Recent Labs Lab 11/20/12 1339 11/22/12 0720 11/25/12 0808  AST 78* 123*  --   ALT 42*  69*  --   ALKPHOS 133* 102  --   BILITOT 0.7 0.3  --   PROT 8.3 6.1  --   ALBUMIN 3.5 2.3* 2.4*    Recent Labs Lab 11/20/12 1339  LIPASE 15   CBC:  Recent Labs Lab 11/20/12 1339 11/21/12 0507 11/22/12 0720  WBC 13.3* 16.6* 11.2*  NEUTROABS 12.4*  --   --   HGB 11.6* 10.2* 9.9*  HCT 34.8* 30.6* 28.8*  MCV 101.8* 101.7* 100.0  PLT 150 104* 98*   Blood Culture    Component Value Date/Time   SDES BLOOD RIGHT HAND 11/22/2012 1232   SPECREQUEST BOTTLES DRAWN AEROBIC AND ANAEROBIC 10CC 11/22/2012 1232   CULT  Value:        BLOOD CULTURE RECEIVED NO GROWTH TO DATE CULTURE WILL BE HELD FOR 5 DAYS BEFORE ISSUING A FINAL NEGATIVE REPORT Performed at [facility] 11/22/2012 1232   REPTSTATUS PENDING 11/22/2012 1232    CBG:  Recent Labs Lab 11/23/12 2038 11/24/12 0726 11/24/12 1224 11/24/12 1637  11/24/12 2326  GLUCAP 138* 123* 205* 165* 224*   Studies/Results: No results found. Medications: . sodium chloride 10 mL/hr (11/21/12 2245)   . aspirin EC  81 mg Oral Daily  . calcium carbonate  1 tablet Oral TID WC  .  ceFAZolin (ANCEF) IV  2 g Intravenous Q M,W,F-2000  . Chlorhexidine Gluconate Cloth  6 each Topical Q0600  . darbepoetin  25 mcg Intravenous Q Mon-HD  . doxercalciferol      . doxercalciferol  4 mcg Intravenous Q M,W,F-HD  . feeding supplement (NEPRO CARB STEADY)  237 mL Oral BID BM  . folic acid  0.5 mg Oral Daily  . gentamicin  60 mg Intravenous Q M,W,F-HD  . heparin  4,200 Units Dialysis Once in dialysis  . [START ON 11/26/2012] heparin  4,200 Units Dialysis Once in dialysis  . heparin  4,200 Units Dialysis Once in dialysis  . insulin aspart  0-9 Units Subcutaneous TID WC  . levothyroxine  75 mcg Oral QAC breakfast  . metoprolol  75 mg Oral BID  . multivitamin  1 tablet Oral QHS  . mupirocin ointment  1 application Nasal BID  . rifampin (RIFADIN) IVPB  300 mg Intravenous Q8H  . sodium chloride  3 mL Intravenous Q12H  . vancomycin  500 mg  Oral Q6H  . vitamin C  500 mg Oral Daily

## 2012-11-25 NOTE — Progress Notes (Signed)
[facility] Progress Note [place] TEAM 1 - Stepdown/ICU TEAM   LANDEN BREELAND ZOX:[id number] DOB: [birth year] DOA: 11/20/2012 PCP: Tillman Abide, MD  Admit HPI / Brief Narrative: 79 y.o. female with past medical history of end-stage renal disease, diabetes mellitus, CAD and prior C. difficile who despite her advanced age and medical problems is rather functional and lives alone with her son nearby. Patient had been in her usual state of health when she complained to her son about chills 3 days prior to admit. She went to dialysis and had a low-grade temp. Dialysis was done partially on that day. Patient visited her PCP and a chest x-ray was unremarkable. She spent the weekend with her son, and was brought to the ED when she was noted to be progressively weaker with diarrhea.  In the emergency room the patient was noted to have a lactic acid level 11.9, anion gap of 24 and a glucose of 206. Her white count was 13.2-92% shift and her urine was positive for UTI. Stool studies were sent for C. difficile as was urine culture and patient was started on IV Rocephin and Flagyl.   Assessment/Plan:  Sepsis Due to UTI, Staph bacteremia, and C diff - sepsis physiology resolved   MRSA bacteremia - 2/2 blood cx + Cont abx per ID - TTE w/o overt vegetations - to have TEE per ID   C diff colitis  Oral vanc - follow clinically - avoid PPI - will require prolonged tx due to need for concomitant abx  - advanced diet  UTI Lactobacillus species w/ < 50, 000 colonies  ESRD on HD  Nephrology is following - dialysis access (fistula) does not appear to be infected - Vasc Surgery has seen and agrees   Anemia of CKD Follow hemoglobin trend - no evidence of acute blood loss  CAD w/ Hx CABG Denies chest pain  DM2 CBG reasonably controlled at the present time - follow w/o change today   HTN Cont to follow w/o change in tx plan today   Pulmonary HTN  GERD   S/P AoVR Does not require chronic  anticoagulation due to use of bioprosthetic valve - at risk for endocarditis  Code Status: NO CODE BLUE Family Communication: none today Disposition Plan: SDU  Consultants: Nephrology  ID Vasc Surgery   Procedures: TTE   Antibiotics: Rocephin 11/16 >>11/17 Vanc 11/16 >> Flagyl 11/16 >>11/18 Rifampin 11/18 >> Gentamicin 11/18 >> Ancef 11/18>>  DVT prophylaxis: SCDs  HPI/Subjective: Pt evaluated on dialysis- feels cold- had some more diarrhea after starting solids- no abd pain or vomiting.   Objective: Blood pressure 137/64, pulse 81, temperature 98 F (36.7 C), temperature source Oral, resp. rate 16, height 5\' 2"  (1.575 m), weight 53.8 kg (118 lb 9.7 oz), SpO2 96.00%.  Intake/Output Summary (Last 24 hours) at 11/25/12 1129 Last data filed at 11/25/12 0548  Gross per 24 hour  Intake    420 ml  Output      0 ml  Net    420 ml   Exam: General: No acute respiratory distress at rest  Lungs: Clear to auscultation bilaterally without wheezes or crackles Cardiovascular: Regular rate and rhythm without gallop or rub normal S1 and S2 Abdomen: Nontender, nondistended, soft, bowel sounds positive, no rebound, no ascites, no appreciable mass Extremities: No significant cyanosis, clubbing, edema bilateral lower extremities  Data Reviewed: Basic Metabolic Panel:  Recent Labs Lab 11/20/12 1339 11/21/12 0507 11/22/12 0720 11/25/12 0808  NA 130* 132* 133* 132*  K 4.7 4.3 3.3* 3.9  CL 86* 93* 94* 93*  CO2 20 18* 23 22  GLUCOSE 206* 116* 101* 157*  BUN 76* 84* 35* 32*  CREATININE 6.73* 7.06* 3.76* 3.81*  CALCIUM 9.8 8.8 8.5 9.3  PHOS  --  5.5*  --  2.8   Liver Function Tests:  Recent Labs Lab 11/20/12 1339 11/22/12 0720 11/25/12 0808  AST 78* 123*  --   ALT 42* 69*  --   ALKPHOS 133* 102  --   BILITOT 0.7 0.3  --   PROT 8.3 6.1  --   ALBUMIN 3.5 2.3* 2.4*    Recent Labs Lab 11/20/12 1339  LIPASE 15   CBC:  Recent Labs Lab 11/20/12 1339  11/21/12 0507 11/22/12 0720  WBC 13.3* 16.6* 11.2*  NEUTROABS 12.4*  --   --   HGB 11.6* 10.2* 9.9*  HCT 34.8* 30.6* 28.8*  MCV 101.8* 101.7* 100.0  PLT 150 104* 98*   CBG:  Recent Labs Lab 11/23/12 2038 11/24/12 0726 11/24/12 1224 11/24/12 1637 11/24/12 2326  GLUCAP 138* 123* 205* 165* 224*    Recent Results (from the past 240 hour(s))  CULTURE, BLOOD (ROUTINE X 2)     Status: None   Collection Time    11/20/12  1:39 PM      Result Value Range Status   Specimen Description BLOOD RIGHT ARM   Final   Special Requests BOTTLES DRAWN AEROBIC AND ANAEROBIC 4CC   Final   Culture  Setup Time     Final   Value: 11/20/2012 17:58     Performed at [facility]   Culture     Final   Value: STAPHYLOCOCCUS AUREUS     Note: RIFAMPIN AND GENTAMICIN SHOULD NOT BE USED AS SINGLE DRUGS FOR TREATMENT OF STAPH INFECTIONS.     Note: Gram Stain Report Called to,Read Back By and Verified With: Bonnee Quin RN on 11/21/12 at 03:10 by Christie Nottingham     Performed at [facility]   Report Status 11/23/2012 FINAL   Final   Organism ID, Bacteria STAPHYLOCOCCUS AUREUS   Final  CULTURE, BLOOD (ROUTINE X 2)     Status: None   Collection Time    11/20/12  2:19 PM      Result Value Range Status   Specimen Description BLOOD RIGHT HAND   Final   Special Requests BOTTLES DRAWN AEROBIC AND ANAEROBIC 10CC   Final   Culture  Setup Time     Final   Value: 11/20/2012 17:58     Performed at [facility]   Culture     Final   Value: STAPHYLOCOCCUS AUREUS     Note: SUSCEPTIBILITIES PERFORMED ON PREVIOUS CULTURE WITHIN THE LAST 5 DAYS.     Note: Gram Stain Report Called to,Read Back By and Verified With: Bonnee Quin RN on 11/21/12 at 03:10 by Christie Nottingham     Performed at [facility]   Report Status 11/23/2012 FINAL   Final  URINE CULTURE     Status: None   Collection Time    11/20/12  3:05 PM      Result Value Range Status   Specimen Description URINE, CATHETERIZED   Final    Special Requests NONE   Final   Culture  Setup Time     Final   Value: 11/20/2012 20:00     Performed at [facility]   Culture     Final   Value: 50,000  COLONIES/mL LACTOBACILLUS SPECIES     Note: Standardized susceptibility testing for this organism is not available.     Performed at [facility]   Report Status 11/21/2012 FINAL   Final  MRSA PCR SCREENING     Status: Abnormal   Collection Time    11/20/12  5:52 PM      Result Value Range Status   MRSA by PCR POSITIVE (*) NEGATIVE Final   Comment:            The GeneXpert MRSA Assay (FDA     approved for NASAL specimens     only), is one component of a     comprehensive MRSA colonization     surveillance program. It is not     intended to diagnose MRSA     infection nor to guide or     monitor treatment for     MRSA infections.     RESULT CALLED TO, READ BACK BY AND VERIFIED WITH:     GRACOU,R RN 1949 11/20/12 MITCHELL,L  CLOSTRIDIUM DIFFICILE BY PCR     Status: Abnormal   Collection Time    11/20/12  8:27 PM      Result Value Range Status   C difficile by pcr POSITIVE (*) NEGATIVE Final   Comment: CRITICAL RESULT CALLED TO, READ BACK BY AND VERIFIED WITH:     PETTIFORD A RN 11/21/12 0805 COSTELLO B  CULTURE, BLOOD (ROUTINE X 2)     Status: None   Collection Time    11/22/12 12:20 PM      Result Value Range Status   Specimen Description BLOOD RIGHT HAND   Final   Special Requests BOTTLES DRAWN AEROBIC AND ANAEROBIC 10CC   Final   Culture  Setup Time     Final   Value: 11/22/2012 18:09     Performed at [facility]   Culture     Final   Value:        BLOOD CULTURE RECEIVED NO GROWTH TO DATE CULTURE WILL BE HELD FOR 5 DAYS BEFORE ISSUING A FINAL NEGATIVE REPORT     Performed at [facility]   Report Status PENDING   Incomplete  CULTURE, BLOOD (ROUTINE X 2)     Status: None   Collection Time    11/22/12 12:32 PM      Result Value Range Status   Specimen Description BLOOD RIGHT HAND    Final   Special Requests BOTTLES DRAWN AEROBIC AND ANAEROBIC 10CC   Final   Culture  Setup Time     Final   Value: 11/22/2012 18:10     Performed at [facility]   Culture     Final   Value:        BLOOD CULTURE RECEIVED NO GROWTH TO DATE CULTURE WILL BE HELD FOR 5 DAYS BEFORE ISSUING A FINAL NEGATIVE REPORT     Performed at [facility]   Report Status PENDING   Incomplete     Studies:  Recent x-ray studies have been reviewed in detail by the Attending Physician  Scheduled Meds:  Scheduled Meds: . amLODipine  5 mg Oral QPM  . aspirin EC  81 mg Oral Daily  . calcium carbonate  1 tablet Oral TID WC  .  ceFAZolin (ANCEF) IV  2 g Intravenous Q M,W,F-2000  . Chlorhexidine Gluconate Cloth  6 each Topical Q0600  . darbepoetin  25 mcg Intravenous Q Mon-HD  . doxercalciferol  4 mcg  Intravenous Q M,W,F-HD  . feeding supplement (NEPRO CARB STEADY)  237 mL Oral BID BM  . folic acid  0.5 mg Oral Daily  . gentamicin  60 mg Intravenous Q M,W,F-HD  . [START ON 11/26/2012] heparin  4,200 Units Dialysis Once in dialysis  . insulin aspart  0-9 Units Subcutaneous TID WC  . levothyroxine  75 mcg Oral QAC breakfast  . metoprolol  75 mg Oral BID  . multivitamin  1 tablet Oral QHS  . mupirocin ointment  1 application Nasal BID  . rifampin (RIFADIN) IVPB  300 mg Intravenous Q8H  . sodium chloride  3 mL Intravenous Q12H  . vancomycin  500 mg Oral Q6H  . vitamin C  500 mg Oral Daily    Time spent on care of this patient: 35 mins   Calvert Cantor, MD  [facility] Office  [phone] Pager - Text Page per Loretha Stapler as per below:  On-Call/Text Page:      [email or web address]      password [email or web address]  If 7PM-7AM, please contact night-coverage [email or web address] Password [email or web address] 11/25/2012, 11:29 AM   LOS: 5 days

## 2012-11-25 NOTE — Progress Notes (Signed)
[facility] ]   Day #4 ancef Day #4 rifampin Day # 4gentamicin Day #4 oral vancomcyin   Subjective: n complaints   Antibiotics:  Anti-infectives   Start     Dose/Rate Route Frequency Ordered Stop   11/23/12 2000  ceFAZolin (ANCEF) IVPB 1 g/50 mL premix  Status:  Discontinued     1 g 100 mL/hr over 30 Minutes Intravenous Every M-W-F (2000) 11/22/12 1138 11/22/12 1139   11/23/12 2000  ceFAZolin (ANCEF) IVPB 2 g/50 mL premix     2 g 100 mL/hr over 30 Minutes Intravenous Every M-W-F (2000) 11/22/12 1139     11/23/12 1600  gentamicin (GARAMYCIN) IVPB 60 mg     60 mg 100 mL/hr over 30 Minutes Intravenous Every M-W-F (Hemodialysis) 11/22/12 1526     11/22/12 1530  gentamicin (GARAMYCIN) IVPB 80 mg     80 mg 100 mL/hr over 30 Minutes Intravenous NOW 11/22/12 1524 11/22/12 1655   11/22/12 1500  rifampin (RIFADIN) 300 mg in sodium chloride 0.9 % 100 mL IVPB     300 mg 200 mL/hr over 30 Minutes Intravenous 3 times per day 11/22/12 1359     11/22/12 1200  vancomycin (VANCOCIN) IVPB 1000 mg/200 mL premix     1,000 mg 200 mL/hr over 60 Minutes Intravenous STAT 11/22/12 1114 11/22/12 1347   11/22/12 1200  ceFAZolin (ANCEF) IVPB 2 g/50 mL premix     2 g 100 mL/hr over 30 Minutes Intravenous STAT 11/22/12 1138 11/22/12 1316   11/21/12 1200  vancomycin (VANCOCIN) 500 mg in sodium chloride 0.9 % 100 mL IVPB  Status:  Discontinued     500 mg 100 mL/hr over 60 Minutes Intravenous Every M-W-F (Hemodialysis) 11/21/12 0928 11/23/12 0909   11/21/12 0930  vancomycin (VANCOCIN) 1,250 mg in sodium chloride 0.9 % 250 mL IVPB     1,250 mg 166.7 mL/hr over 90 Minutes Intravenous STAT 11/21/12 0927 11/22/12 0930   11/20/12 1800  cefTRIAXone (ROCEPHIN) 1 g in dextrose 5 % 50 mL IVPB  Status:  Discontinued     1 g 100 mL/hr over 30 Minutes Intravenous Every 24 hours 11/20/12 1756 11/22/12 1120   11/20/12 1800  vancomycin (VANCOCIN) 50 mg/mL oral solution 500 mg     500  mg Oral 4 times per day 11/20/12 1756 12/04/12 1759   11/20/12 1756  metroNIDAZOLE (FLAGYL) IVPB 500 mg  Status:  Discontinued     500 mg 100 mL/hr over 60 Minutes Intravenous Every 8 hours 11/20/12 1756 11/22/12 1400   11/20/12 1545  metroNIDAZOLE (FLAGYL) IVPB 500 mg     500 mg 100 mL/hr over 60 Minutes Intravenous  Once 11/20/12 1535 11/20/12 1725   11/20/12 1545  cefTRIAXone (ROCEPHIN) 1 g in dextrose 5 % 50 mL IVPB     1 g 100 mL/hr over 30 Minutes Intravenous  Once 11/20/12 1538 11/20/12 1724      Medications: Scheduled Meds: . amLODipine  5 mg Oral QPM  . aspirin EC  81 mg Oral Daily  . calcium carbonate  1 tablet Oral TID WC  .  ceFAZolin (ANCEF) IV  2 g Intravenous Q M,W,F-2000  . Chlorhexidine Gluconate Cloth  6 each Topical Q0600  . darbepoetin  25 mcg Intravenous Q Mon-HD  . doxercalciferol  4 mcg Intravenous Q M,W,F-HD  . feeding supplement (NEPRO CARB STEADY)  237 mL Oral BID BM  . folic acid  0.5 mg Oral Daily  . gentamicin  60 mg Intravenous Q M,W,F-HD  . insulin aspart  0-9 Units Subcutaneous TID WC  . levothyroxine  75 mcg Oral QAC breakfast  . metoprolol  75 mg Oral BID  . multivitamin  1 tablet Oral QHS  . mupirocin ointment  1 application Nasal BID  . rifampin (RIFADIN) IVPB  300 mg Intravenous Q8H  . sodium chloride  3 mL Intravenous Q12H  . vancomycin  500 mg Oral Q6H  . vitamin C  500 mg Oral Daily   Continuous Infusions: . sodium chloride 10 mL/hr (11/21/12 2245)   PRN Meds:.acetaminophen, acetaminophen, hydrALAZINE, HYDROcodone-acetaminophen, ondansetron (ZOFRAN) IV   Objective: Weight change: -2 lb 13.9 oz (-1.3 kg)  Intake/Output Summary (Last 24 hours) at 11/25/12 1252 Last data filed at 11/25/12 1115  Gross per 24 hour  Intake    420 ml  Output   2538 ml  Net  -2118 ml   Blood pressure 169/65, pulse 78, temperature 97.4 F (36.3 C), temperature source Axillary, resp. rate 18, height 5\' 2"  (1.575 m), weight 112 lb 14 oz (51.2 kg), SpO2  99.00%. Temp:  [97.4 F (36.3 C)-98 F (36.7 C)] 97.4 F (36.3 C) (11/21 1115) Pulse Rate:  [72-85] 78 (11/21 1115) Resp:  [16-27] 18 (11/21 1115) BP: (137-195)/(60-100) 169/65 mmHg (11/21 1115) SpO2:  [95 %-100 %] 99 % (11/21 1115) Weight:  [112 lb 14 oz (51.2 kg)-118 lb 9.7 oz (53.8 kg)] 112 lb 14 oz (51.2 kg) (11/21 1115)  Physical Exam: General: Alert and awake, oriented x3, Receiving HD   Lab Results: No results found for this basename: WBC, HGB, HCT, PLT,  in the last 72 hours  BMET  Recent Labs  11/25/12 0808  NA 132*  K 3.9  CL 93*  CO2 22  GLUCOSE 157*  BUN 32*  CREATININE 3.81*  CALCIUM 9.3    Micro Results: Recent Results (from the past 240 hour(s))  CULTURE, BLOOD (ROUTINE X 2)     Status: None   Collection Time    11/20/12  1:39 PM      Result Value Range Status   Specimen Description BLOOD RIGHT ARM   Final   Special Requests BOTTLES DRAWN AEROBIC AND ANAEROBIC 4CC   Final   Culture  Setup Time     Final   Value: 11/20/2012 17:58     Performed at [facility]   Culture     Final   Value: STAPHYLOCOCCUS AUREUS     Note: RIFAMPIN AND GENTAMICIN SHOULD NOT BE USED AS SINGLE DRUGS FOR TREATMENT OF STAPH INFECTIONS.     Note: Gram Stain Report Called to,Read Back By and Verified With: Bonnee Quin RN on 11/21/12 at 03:10 by Christie Nottingham     Performed at [facility]   Report Status 11/23/2012 FINAL   Final   Organism ID, Bacteria STAPHYLOCOCCUS AUREUS   Final  CULTURE, BLOOD (ROUTINE X 2)     Status: None   Collection Time    11/20/12  2:19 PM      Result Value Range Status   Specimen Description BLOOD RIGHT HAND   Final   Special Requests BOTTLES DRAWN AEROBIC AND ANAEROBIC 10CC   Final   Culture  Setup Time     Final   Value: 11/20/2012 17:58     Performed at [facility]   Culture     Final   Value: STAPHYLOCOCCUS AUREUS     Note: SUSCEPTIBILITIES PERFORMED ON PREVIOUS CULTURE WITHIN THE LAST 5  DAYS.     Note: Gram Stain  Report Called to,Read Back By and Verified With: Bonnee Quin RN on 11/21/12 at 03:10 by Christie Nottingham     Performed at [facility]   Report Status 11/23/2012 FINAL   Final  URINE CULTURE     Status: None   Collection Time    11/20/12  3:05 PM      Result Value Range Status   Specimen Description URINE, CATHETERIZED   Final   Special Requests NONE   Final   Culture  Setup Time     Final   Value: 11/20/2012 20:00     Performed at [facility]   Culture     Final   Value: 50,000 COLONIES/mL LACTOBACILLUS SPECIES     Note: Standardized susceptibility testing for this organism is not available.     Performed at [facility]   Report Status 11/21/2012 FINAL   Final  MRSA PCR SCREENING     Status: Abnormal   Collection Time    11/20/12  5:52 PM      Result Value Range Status   MRSA by PCR POSITIVE (*) NEGATIVE Final   Comment:            The GeneXpert MRSA Assay (FDA     approved for NASAL specimens     only), is one component of a     comprehensive MRSA colonization     surveillance program. It is not     intended to diagnose MRSA     infection nor to guide or     monitor treatment for     MRSA infections.     RESULT CALLED TO, READ BACK BY AND VERIFIED WITH:     GRACOU,R RN 1949 11/20/12 MITCHELL,L  CLOSTRIDIUM DIFFICILE BY PCR     Status: Abnormal   Collection Time    11/20/12  8:27 PM      Result Value Range Status   C difficile by pcr POSITIVE (*) NEGATIVE Final   Comment: CRITICAL RESULT CALLED TO, READ BACK BY AND VERIFIED WITH:     PETTIFORD A RN 11/21/12 0805 COSTELLO B  CULTURE, BLOOD (ROUTINE X 2)     Status: None   Collection Time    11/22/12 12:20 PM      Result Value Range Status   Specimen Description BLOOD RIGHT HAND   Final   Special Requests BOTTLES DRAWN AEROBIC AND ANAEROBIC 10CC   Final   Culture  Setup Time     Final   Value: 11/22/2012 18:09     Performed at [facility]   Culture     Final   Value:        BLOOD CULTURE  RECEIVED NO GROWTH TO DATE CULTURE WILL BE HELD FOR 5 DAYS BEFORE ISSUING A FINAL NEGATIVE REPORT     Performed at [facility]   Report Status PENDING   Incomplete  CULTURE, BLOOD (ROUTINE X 2)     Status: None   Collection Time    11/22/12 12:32 PM      Result Value Range Status   Specimen Description BLOOD RIGHT HAND   Final   Special Requests BOTTLES DRAWN AEROBIC AND ANAEROBIC 10CC   Final   Culture  Setup Time     Final   Value: 11/22/2012 18:10     Performed at [facility]   Culture     Final   Value:  BLOOD CULTURE RECEIVED NO GROWTH TO DATE CULTURE WILL BE HELD FOR 5 DAYS BEFORE ISSUING A FINAL NEGATIVE REPORT     Performed at [facility]   Report Status PENDING   Incomplete    Studies/Results: No results found.    Assessment/Plan: [name] is a 79 y.o. female with 79 year old lady with history of diabetes mellitus coronary artery disease valvular heart disease status post coronary artery bypass grafting aortic valve replacement in 2009 and stage renal disease on hemodialysis via AV fistula now with staph aureus bacteremia with concern for infection of her aortic valve or one of her native valves. She has had recent surgery to remove basal cell carcinoma.    #1 Methicillin Sensitive Staphylococcus Aureus Bacteremia, rule out endocarditis: 2 d echo not showing vegetations, Dr. Edilia Bo feels AVF not infected overtly  -- continue cefazolin ,gentamicin and rifampin given concern for endocarditis ,   --Repeated  blood cultures are incubating --Greatly appreciate Dr. Edilia Bo evaluating AV fistula --pt for TEE today    #2 C difficile colitis: -continue vancomycin at 500mg  qid x2 weeks IF NOT for duration of her IV abx + 2 weeks   --she will be at significant risk of relapse while on abx for bacteremia, possible AVF and possible prosthetic and or native valve infection      LOS: 5 days   Acey Lav 11/25/2012, 12:52 PM

## 2012-11-25 NOTE — Progress Notes (Signed)
Infectious Disease: was started on Vanc for 2/2 BCx growing GPC- MSSA Shawnelle Spoerl stopped by ID, now on Ancef + gent + rifampin (BIOprosthetic valve) for concern of endocarditis- TEE needs to be done- ID inclined to treat for 6 weeks even if TEE is neg, 2D neg for vegetation; target gent pre-HD <91mcg/mL; Vanc po for CDiff; Pt likely did not receive LD or MD of Vanc on 11/18 (Floor/HD RN not here and unable to confirm but per HD records, appears to have not been given) afeb/24h, WBC 11.2 << 16.6.  HD today (3.5hrs BFR 400)  Vanc 11/18 >> 11/19 CTX 11/17 >> 11/18 Vanc po 11/17 >> Flagyl IV 11/17 >>11/20 Gent 11/18>> Cefazolin 11/19>> Rifampin 11/18 >>   11/16 UCx: 50k lactobacillus (likely colonization) 11/16 BCx: MSSA 11/18 BCx: pending   PLAN:  - Cont Cefazolin 2g post-HD on MWF - gent 60mg  IV qHD MWF (got 80mg  loading dose 11/18)- wait to get level until closer to steady state- sometime the week of thanksgiving - f/u LOT of gent- BIOprosthetic, unsure if she will need full course [place]ena[place] leaning towards yes it appears) - rifampin 300mg  IV q8hr per MD (poorly dialyzed, dose ok)

## 2012-11-26 ENCOUNTER — Inpatient Hospital Stay (HOSPITAL_COMMUNITY): Payer: Medicare Other

## 2012-11-26 DIAGNOSIS — D72829 Elevated white blood cell count, unspecified: Secondary | ICD-10-CM

## 2012-11-26 LAB — CBC
HCT: 32.2 % — ABNORMAL LOW (ref 36.0–46.0)
HCT: 30.7 % — ABNORMAL LOW (ref 36.0–46.0)
Hemoglobin: 10.3 g/dL — ABNORMAL LOW (ref 12.0–15.0)
Hemoglobin: 9.9 g/dL — ABNORMAL LOW (ref 12.0–15.0)
MCH: 33.8 pg (ref 26.0–34.0)
MCHC: 32.2 g/dL (ref 30.0–36.0)
MCV: 104.9 fL — ABNORMAL HIGH (ref 78.0–100.0)
MCV: 104.8 fL — ABNORMAL HIGH (ref 78.0–100.0)
RBC: 3.07 MIL/uL — ABNORMAL LOW (ref 3.87–5.11)
RBC: 2.93 MIL/uL — ABNORMAL LOW (ref 3.87–5.11)
RDW: 15 % (ref 11.5–15.5)
WBC: 20.1 10*3/uL — ABNORMAL HIGH (ref 4.0–10.5)
WBC: 22 10*3/uL — ABNORMAL HIGH (ref 4.0–10.5)

## 2012-11-26 LAB — BASIC METABOLIC PANEL
BUN: 19 mg/dL (ref 6–23)
CO2: 24 mEq/L (ref 19–32)
Chloride: 96 mEq/L (ref 96–112)
Creatinine, Ser: 2.88 mg/dL — ABNORMAL HIGH (ref 0.50–1.10)
GFR calc Af Amer: 16 mL/min — ABNORMAL LOW (ref >90)
Potassium: 3.7 mEq/L (ref 3.5–5.1)

## 2012-11-26 LAB — GLUCOSE, CAPILLARY
Glucose-Capillary: 157 mg/dL — ABNORMAL HIGH (ref 70–99)
Glucose-Capillary: 253 mg/dL — ABNORMAL HIGH (ref 70–99)
Glucose-Capillary: 160 mg/dL — ABNORMAL HIGH (ref 70–99)

## 2012-11-26 MED ORDER — DEXTROSE 5 % IV SOLN
2.0000 g | INTRAVENOUS | Status: DC
  Administered 2012-11-26 – 2012-11-29 (×16): 2 g via INTRAVENOUS
  Filled 2012-11-26 (×20): qty 2000

## 2012-11-26 MED ORDER — NEPRO/CARBSTEADY PO LIQD
237.0000 mL | Freq: Three times a day (TID) | ORAL | Status: DC
  Administered 2012-11-26 – 2012-12-05 (×20): 237 mL via ORAL

## 2012-11-26 MED ORDER — FAMOTIDINE IN NACL 20-0.9 MG/50ML-% IV SOLN
20.0000 mg | INTRAVENOUS | Status: DC
  Administered 2012-11-26 – 2012-11-28 (×3): 20 mg via INTRAVENOUS
  Filled 2012-11-26 (×4): qty 50

## 2012-11-26 MED ORDER — FAMOTIDINE IN NACL 20-0.9 MG/50ML-% IV SOLN: 20.0000 mg | Freq: Two times a day (BID) | INTRAVENOUS | Status: DC

## 2012-11-26 NOTE — Progress Notes (Signed)
[facility] Progress Note [place] TEAM 1 - Stepdown/ICU TEAM   [name] EAV:[id number] DOB: 24-Apr-1920 DOA: 11/20/2012 PCP: Tillman Abide, MD  Admit HPI / Brief Narrative: 79 y.o. female with past medical history of end-stage renal disease, diabetes mellitus, CAD and prior C. difficile who despite her advanced age and medical problems is rather functional and lives alone with her son nearby. Patient had been in her usual state of health when she complained to her son about chills 3 days prior to admit. She went to dialysis and had a low-grade temp. Dialysis was done partially on that day. Patient visited her PCP and a chest x-ray was unremarkable. She spent the weekend with her son, and was brought to the ED when she was noted to be progressively weaker with diarrhea.  In the emergency room the patient was noted to have a lactic acid level 11.9, anion gap of 24 and a glucose of 206. Her white count was 13.2-92% shift and her urine was positive for UTI. Stool studies were sent for C. difficile as was urine culture and patient was started on IV Rocephin and Flagyl.   Assessment/Plan:  Sepsis -  UTI, Staph bacteremia, and C diff -  - WBC counts noted to be rising- Pt not as alert last night or this morning - check UA - c diff relapse? not much stool output or abd pain   MRSA bacteremia - 2/2 blood cx + Cont abx per ID - TTE w/o overt vegetations - to have TEE per ID   C diff colitis  Oral vanc - follow clinically - avoid PPI - will require prolonged tx due to need for concomitant abx   Epigastric tenderness - likely gastritis- may be contributing to poor appetite-  start Pepcid BID  UTI Lactobacillus species w/ < 50, 000 colonies - recheck UA today due to rising WBC count  ESRD on HD  Nephrology is following - dialysis access (fistula) does not appear to be infected - Vasc Surgery has seen and agrees   Anemia of CKD Follow hemoglobin trend - no evidence of acute blood  loss  CAD w/ Hx CABG Denies chest pain  DM2 CBG reasonably controlled at the present time - follow w/o change today   HTN Cont to follow w/o change in tx plan today   Pulmonary HTN  GERD   S/P AoVR Does not require chronic anticoagulation due to use of bioprosthetic valve - at risk for endocarditis  Moderate Protein Calorie malnutrition -encourage PO intake- Nepro shakes-  Magic cups - allow son to bring food from outside  Code Status: NO CODE BLUE Family Communication: with son Disposition Plan: SDU  Consultants: Nephrology  ID Vasc Surgery   Procedures: TTE   Antibiotics: Vanc 11/16 >> Rifampin 11/18 >> Gentamicin 11/18 >> Ancef 11/18>> Rocephin 11/16 >>11/17 Flagyl 11/16 >>11/18 DVT prophylaxis: SCDs  HPI/Subjective: Pt sleepy this AM- per son was quite sleepy after dialysis yesterday and he was concerned about this- NP from last night came to evaluate pt and did not find any abnormalities on exam. WBC count noted to be elevated this AM. Further w/u as above. Appetite quite poor.   Objective: Blood pressure 158/52, pulse 77, temperature 98.3 F (36.8 C), temperature source Oral, resp. rate 22, height 5\' 2"  (1.575 m), weight 52.7 kg (116 lb 2.9 oz), SpO2 96.00%.  Intake/Output Summary (Last 24 hours) at 11/26/12 7829 Last data filed at 11/26/12 0200  Gross per 24 hour  Intake  483 ml  Output   2538 ml  Net  -2055 ml   Exam: General: sleepy but easily awakened and oriented x 3, No acute respiratory distress at rest  Lungs: Clear to auscultation bilaterally without wheezes or crackles Cardiovascular: Regular rate and rhythm without gallop or rub normal S1 and S2 Abdomen: tender in epigastrium, nondistended, soft, bowel sounds positive, no rebound, no ascites, no appreciable mass- rectal bag has small amount of brown stool  Extremities: No significant cyanosis, clubbing, edema bilateral lower extremities  Data Reviewed: Basic Metabolic  Panel:  Recent Labs Lab 11/20/12 1339 11/21/12 0507 11/22/12 0720 11/25/12 0808 11/26/12 0547  NA 130* 132* 133* 132* 134*  K 4.7 4.3 3.3* 3.9 3.7  CL 86* 93* 94* 93* 96  CO2 20 18* 23 22 24   GLUCOSE 206* 116* 101* 157* 147*  BUN 76* 84* 35* 32* 19  CREATININE 6.73* 7.06* 3.76* 3.81* 2.88*  CALCIUM 9.8 8.8 8.5 9.3 8.8  PHOS  --  5.5*  --  2.8  --    Liver Function Tests:  Recent Labs Lab 11/20/12 1339 11/22/12 0720 11/25/12 0808  AST 78* 123*  --   ALT 42* 69*  --   ALKPHOS 133* 102  --   BILITOT 0.7 0.3  --   PROT 8.3 6.1  --   ALBUMIN 3.5 2.3* 2.4*    Recent Labs Lab 11/20/12 1339  LIPASE 15   CBC:  Recent Labs Lab 11/20/12 1339 11/21/12 0507 11/22/12 0720 11/26/12 0547  WBC 13.3* 16.6* 11.2* 20.1*  NEUTROABS 12.4*  --   --   --   HGB 11.6* 10.2* 9.9* 10.3*  HCT 34.8* 30.6* 28.8* 32.2*  MCV 101.8* 101.7* 100.0 104.9*  PLT 150 104* 98* 176   CBG:  Recent Labs Lab 11/24/12 1637 11/24/12 2326 11/25/12 1315 11/25/12 1633 11/25/12 2139  GLUCAP 165* 224* 112* 178* 208*    Recent Results (from the past 240 hour(s))  CULTURE, BLOOD (ROUTINE X 2)     Status: None   Collection Time    11/20/12  1:39 PM      Result Value Range Status   Specimen Description BLOOD RIGHT ARM   Final   Special Requests BOTTLES DRAWN AEROBIC AND ANAEROBIC 4CC   Final   Culture  Setup Time     Final   Value: 11/20/2012 17:58     Performed at [facility]   Culture     Final   Value: STAPHYLOCOCCUS AUREUS     Note: RIFAMPIN AND GENTAMICIN SHOULD NOT BE USED AS SINGLE DRUGS FOR TREATMENT OF STAPH INFECTIONS.     Note: Gram Stain Report Called to,Read Back By and Verified With: Bonnee Quin RN on 11/21/12 at 03:10 by Christie Nottingham     Performed at [facility]   Report Status 11/23/2012 FINAL   Final   Organism ID, Bacteria STAPHYLOCOCCUS AUREUS   Final  CULTURE, BLOOD (ROUTINE X 2)     Status: None   Collection Time    11/20/12  2:19 PM      Result  Value Range Status   Specimen Description BLOOD RIGHT HAND   Final   Special Requests BOTTLES DRAWN AEROBIC AND ANAEROBIC 10CC   Final   Culture  Setup Time     Final   Value: 11/20/2012 17:58     Performed at [facility]   Culture     Final   Value: STAPHYLOCOCCUS AUREUS     Note:  SUSCEPTIBILITIES PERFORMED ON PREVIOUS CULTURE WITHIN THE LAST 5 DAYS.     Note: Gram Stain Report Called to,Read Back By and Verified With: Bonnee Quin RN on 11/21/12 at 03:10 by Christie Nottingham     Performed at [facility]   Report Status 11/23/2012 FINAL   Final  URINE CULTURE     Status: None   Collection Time    11/20/12  3:05 PM      Result Value Range Status   Specimen Description URINE, CATHETERIZED   Final   Special Requests NONE   Final   Culture  Setup Time     Final   Value: 11/20/2012 20:00     Performed at [facility]   Culture     Final   Value: 50,000 COLONIES/mL LACTOBACILLUS SPECIES     Note: Standardized susceptibility testing for this organism is not available.     Performed at [facility]   Report Status 11/21/2012 FINAL   Final  MRSA PCR SCREENING     Status: Abnormal   Collection Time    11/20/12  5:52 PM      Result Value Range Status   MRSA by PCR POSITIVE (*) NEGATIVE Final   Comment:            The GeneXpert MRSA Assay (FDA     approved for NASAL specimens     only), is one component of a     comprehensive MRSA colonization     surveillance program. It is not     intended to diagnose MRSA     infection nor to guide or     monitor treatment for     MRSA infections.     RESULT CALLED TO, READ BACK BY AND VERIFIED WITH:     GRACOU,R RN 1949 11/20/12 MITCHELL,L  CLOSTRIDIUM DIFFICILE BY PCR     Status: Abnormal   Collection Time    11/20/12  8:27 PM      Result Value Range Status   C difficile by pcr POSITIVE (*) NEGATIVE Final   Comment: CRITICAL RESULT CALLED TO, READ BACK BY AND VERIFIED WITH:     PETTIFORD A RN 11/21/12 0805 COSTELLO  B  CULTURE, BLOOD (ROUTINE X 2)     Status: None   Collection Time    11/22/12 12:20 PM      Result Value Range Status   Specimen Description BLOOD RIGHT HAND   Final   Special Requests BOTTLES DRAWN AEROBIC AND ANAEROBIC 10CC   Final   Culture  Setup Time     Final   Value: 11/22/2012 18:09     Performed at [facility]   Culture     Final   Value:        BLOOD CULTURE RECEIVED NO GROWTH TO DATE CULTURE WILL BE HELD FOR 5 DAYS BEFORE ISSUING A FINAL NEGATIVE REPORT     Performed at [facility]   Report Status PENDING   Incomplete  CULTURE, BLOOD (ROUTINE X 2)     Status: None   Collection Time    11/22/12 12:32 PM      Result Value Range Status   Specimen Description BLOOD RIGHT HAND   Final   Special Requests BOTTLES DRAWN AEROBIC AND ANAEROBIC 10CC   Final   Culture  Setup Time     Final   Value: 11/22/2012 18:10     Performed at [facility]  Final   Value:        BLOOD CULTURE RECEIVED NO GROWTH TO DATE CULTURE WILL BE HELD FOR 5 DAYS BEFORE ISSUING A FINAL NEGATIVE REPORT     Performed at [facility]   Report Status PENDING   Incomplete     Studies:  Recent x-ray studies have been reviewed in detail by the Attending Physician  Scheduled Meds:  Scheduled Meds: . amLODipine  5 mg Oral QPM  . aspirin EC  81 mg Oral Daily  . calcium carbonate  1 tablet Oral TID WC  .  ceFAZolin (ANCEF) IV  2 g Intravenous Q M,W,F-2000  . Chlorhexidine Gluconate Cloth  6 each Topical Q0600  . darbepoetin  25 mcg Intravenous Q Mon-HD  . doxercalciferol  4 mcg Intravenous Q M,W,F-HD  . feeding supplement (NEPRO CARB STEADY)  237 mL Oral BID BM  . feeding supplement (NEPRO CARB STEADY)  237 mL Oral TID BM  . folic acid  0.5 mg Oral Daily  . gentamicin  60 mg Intravenous Q M,W,F-HD  . insulin aspart  0-9 Units Subcutaneous TID WC  . levothyroxine  75 mcg Oral QAC breakfast  . metoprolol  75 mg Oral BID  . multivitamin  1 tablet Oral QHS  .  mupirocin ointment  1 application Nasal BID  . rifampin (RIFADIN) IVPB  300 mg Intravenous Q8H  . sodium chloride  3 mL Intravenous Q12H  . vancomycin  500 mg Oral Q6H  . vitamin C  500 mg Oral Daily    Time spent on care of this patient: 35 mins   Calvert Cantor, MD  [facility] Office  [phone] Pager - Text Page per Loretha Stapler as per below:  On-Call/Text Page:      [email or web address]      password [email or web address]  If 7PM-7AM, please contact night-coverage [email or web address] Password [email or web address] 11/26/2012, 8:12 AM   LOS: 6 days

## 2012-11-26 NOTE — Progress Notes (Signed)
[facility] Progress Note  Subjective:   No complaints  Objective Filed Vitals:   11/25/12 2108 11/25/12 2356 11/26/12 0422 11/26/12 0800  BP:  146/47 157/61 158/52  Pulse: 84 77 79 77  Temp:  99.8 F (37.7 C) 98.3 F (36.8 C) 98.7 F (37.1 C)  TempSrc:  Axillary Oral Oral  Resp:  22 29 22   Height:      Weight:   52.7 kg (116 lb 2.9 oz)   SpO2:  94% 95% 96%   Physical Exam Gen: frail, weak, drowsy, cooperative, NAD Heart: RRR, prosthetic valve click, soft murmur Lungs: CTA bilat, no wheezes or rhonchi Abdomen: Soft, diffusely tender to deep palp, + BS Extremities: No LE edema, SCDs present Dialysis Access: L AVF + bruit  Dialysis orders: MWF at [facility]  3hrs 50.5kg 2K/2.25Ca 400/800 L arm AVF Heparin 4200  Hectorol 4ug Epo 1800 Venofer none  Assessment/Plan: 1. MSSA bacteremia / hx prosthetic AVR: TTE negative for vegetation, on ancef/gent/rifampin per ID, vasc surgery has eval AV fistula and no signs of infection; not responding well clinically, though temp is down, wbc back up today; have d/w primary and ID 2. Cdif+ diarrhea: on po vanc, ID following 3. ESRD: HD Sunday (holiday sched) 4. Anemia of CKD: Hb 9.9, op tsat 40%, cont esa w darbe 25 on wed 5. MBD (metabolic bone disease)- P 5.5, op pth 216, cont vit D and Tums                                                    6. HTN/volume - vol up, added norvasc to metoprolol, increase goal w HD Sunday, has lost body wt 7. Nutrition: On full liquids, nepro, multivitamin 8. DM2 9. CABG/AVR 2009  Vinson Moselle MD pager [phone]    cell [phone] 11/26/2012, 10:21 AM    Additional Objective Labs: Basic Metabolic Panel:  Recent Labs Lab 11/21/12 0507 11/22/12 0720 11/25/12 0808 11/26/12 0547  NA 132* 133* 132* 134*  K 4.3 3.3* 3.9 3.7  CL 93* 94* 93* 96  CO2 18* 23 22 24   GLUCOSE 116* 101* 157* 147*  BUN 84* 35* 32* 19  CREATININE 7.06* 3.76* 3.81* 2.88*  CALCIUM 8.8 8.5 9.3 8.8  PHOS  5.5*  --  2.8  --    Liver Function Tests:  Recent Labs Lab 11/20/12 1339 11/22/12 0720 11/25/12 0808  AST 78* 123*  --   ALT 42* 69*  --   ALKPHOS 133* 102  --   BILITOT 0.7 0.3  --   PROT 8.3 6.1  --   ALBUMIN 3.5 2.3* 2.4*    Recent Labs Lab 11/20/12 1339  LIPASE 15   CBC:  Recent Labs Lab 11/20/12 1339 11/21/12 0507 11/22/12 0720 11/26/12 0547  WBC 13.3* 16.6* 11.2* 20.1*  NEUTROABS 12.4*  --   --   --   HGB 11.6* 10.2* 9.9* 10.3*  HCT 34.8* 30.6* 28.8* 32.2*  MCV 101.8* 101.7* 100.0 104.9*  PLT 150 104* 98* 176   Blood Culture    Component Value Date/Time   SDES BLOOD RIGHT HAND 11/22/2012 1232   SPECREQUEST BOTTLES DRAWN AEROBIC AND ANAEROBIC 10CC 11/22/2012 1232   CULT  Value:        BLOOD CULTURE RECEIVED NO GROWTH TO DATE CULTURE WILL BE HELD FOR 5 DAYS BEFORE ISSUING  A FINAL NEGATIVE REPORT Performed at [facility] 11/22/2012 1232   REPTSTATUS PENDING 11/22/2012 1232    CBG:  Recent Labs Lab 11/24/12 2326 11/25/12 1315 11/25/12 1633 11/25/12 2139 11/26/12 0815  GLUCAP 224* 112* 178* 208* 157*   Studies/Results: No results found. Medications: . sodium chloride 10 mL/hr at 11/26/12 0200   . amLODipine  5 mg Oral QPM  . aspirin EC  81 mg Oral Daily  . calcium carbonate  1 tablet Oral TID WC  .  ceFAZolin (ANCEF) IV  2 g Intravenous Q M,W,F-2000  . Chlorhexidine Gluconate Cloth  6 each Topical Q0600  . darbepoetin  25 mcg Intravenous Q Mon-HD  . doxercalciferol  4 mcg Intravenous Q M,W,F-HD  . famotidine (PEPCID) IV  20 mg Intravenous Q12H  . feeding supplement (NEPRO CARB STEADY)  237 mL Oral TID BM  . folic acid  0.5 mg Oral Daily  . gentamicin  60 mg Intravenous Q M,W,F-HD  . insulin aspart  0-9 Units Subcutaneous TID WC  . levothyroxine  75 mcg Oral QAC breakfast  . metoprolol  75 mg Oral BID  . multivitamin  1 tablet Oral QHS  . mupirocin ointment  1 application Nasal BID  . rifampin (RIFADIN) IVPB  300 mg Intravenous  Q8H  . sodium chloride  3 mL Intravenous Q12H  . vancomycin  500 mg Oral Q6H  . vitamin C  500 mg Oral Daily

## 2012-11-27 LAB — RENAL FUNCTION PANEL
Albumin: 1.9 g/dL — ABNORMAL LOW (ref 3.5–5.2)
CO2: 25 mEq/L (ref 19–32)
Chloride: 95 mEq/L — ABNORMAL LOW (ref 96–112)
GFR calc Af Amer: 11 mL/min — ABNORMAL LOW (ref >90)
GFR calc non Af Amer: 9 mL/min — ABNORMAL LOW (ref >90)
Glucose, Bld: 165 mg/dL — ABNORMAL HIGH (ref 70–99)
Phosphorus: 3.1 mg/dL (ref 2.3–4.6)
Potassium: 3.6 mEq/L (ref 3.5–5.1)
Sodium: 132 mEq/L — ABNORMAL LOW (ref 135–145)

## 2012-11-27 LAB — URINALYSIS, ROUTINE W REFLEX MICROSCOPIC
Glucose, UA: NEGATIVE mg/dL
Ketones, ur: 15 mg/dL — AB
pH: 6 (ref 5.0–8.0)

## 2012-11-27 LAB — CBC
MCHC: 32.5 g/dL (ref 30.0–36.0)
MCV: 103.7 fL — ABNORMAL HIGH (ref 78.0–100.0)
Platelets: 188 10*3/uL (ref 150–400)
RBC: 3 MIL/uL — ABNORMAL LOW (ref 3.87–5.11)
RDW: 14.9 % (ref 11.5–15.5)
WBC: 22.9 10*3/uL — ABNORMAL HIGH (ref 4.0–10.5)

## 2012-11-27 LAB — GLUCOSE, CAPILLARY
Glucose-Capillary: 128 mg/dL — ABNORMAL HIGH (ref 70–99)
Glucose-Capillary: 230 mg/dL — ABNORMAL HIGH (ref 70–99)
Glucose-Capillary: 226 mg/dL — ABNORMAL HIGH (ref 70–99)

## 2012-11-27 MED ORDER — NEPRO/CARBSTEADY PO LIQD
237.0000 mL | ORAL | Status: DC | PRN
  Filled 2012-11-27: qty 237

## 2012-11-27 MED ORDER — LIDOCAINE HCL (PF) 1 % IJ SOLN: 5.0000 mL | INTRAMUSCULAR | Status: DC | PRN

## 2012-11-27 MED ORDER — HEPARIN SODIUM (PORCINE) 1000 UNIT/ML DIALYSIS: 4200.0000 [IU] | Freq: Once | INTRAMUSCULAR | Status: DC

## 2012-11-27 MED ORDER — DARBEPOETIN ALFA-POLYSORBATE 25 MCG/0.42ML IJ SOLN
INTRAMUSCULAR | Status: AC
  Filled 2012-11-27: qty 0.42

## 2012-11-27 MED ORDER — SODIUM CHLORIDE 0.9 % IV SOLN: 100.0000 mL | INTRAVENOUS | Status: DC | PRN

## 2012-11-27 MED ORDER — HEPARIN SODIUM (PORCINE) 1000 UNIT/ML DIALYSIS: 1000.0000 [IU] | INTRAMUSCULAR | Status: DC | PRN

## 2012-11-27 MED ORDER — DOXERCALCIFEROL 4 MCG/2ML IV SOLN
INTRAVENOUS | Status: AC
  Filled 2012-11-27: qty 2

## 2012-11-27 MED ORDER — LIDOCAINE-PRILOCAINE 2.5-2.5 % EX CREA: 1.0000 "application " | TOPICAL_CREAM | CUTANEOUS | Status: DC | PRN

## 2012-11-27 MED ORDER — PENTAFLUOROPROP-TETRAFLUOROETH EX AERO: 1.0000 "application " | INHALATION_SPRAY | CUTANEOUS | Status: DC | PRN

## 2012-11-27 MED ORDER — ALTEPLASE 2 MG IJ SOLR
2.0000 mg | Freq: Once | INTRAMUSCULAR | Status: DC | PRN
  Filled 2012-11-27: qty 2

## 2012-11-27 NOTE — Progress Notes (Signed)
[facility]     Day #2 nafcillin Day #6 rifampin Day # 6 gentamicin Day #6 oral vancomcyin   Subjective: Appetite better today  Antibiotics:  Anti-infectives   Start     Dose/Rate Route Frequency Ordered Stop   11/26/12 1800  nafcillin 2 g in dextrose 5 % 50 mL IVPB     2 g 100 mL/hr over 30 Minutes Intravenous 6 times per day 11/26/12 1710     11/23/12 2000  ceFAZolin (ANCEF) IVPB 1 g/50 mL premix  Status:  Discontinued     1 g 100 mL/hr over 30 Minutes Intravenous Every M-W-F (2000) 11/22/12 1138 11/22/12 1139   11/23/12 2000  ceFAZolin (ANCEF) IVPB 2 g/50 mL premix  Status:  Discontinued     2 g 100 mL/hr over 30 Minutes Intravenous Every M-W-F (2000) 11/22/12 1139 11/26/12 1710   11/23/12 1600  gentamicin (GARAMYCIN) IVPB 60 mg     60 mg 100 mL/hr over 30 Minutes Intravenous Every M-W-F (Hemodialysis) 11/22/12 1526     11/22/12 1530  gentamicin (GARAMYCIN) IVPB 80 mg     80 mg 100 mL/hr over 30 Minutes Intravenous NOW 11/22/12 1524 11/22/12 1655   11/22/12 1500  rifampin (RIFADIN) 300 mg in sodium chloride 0.9 % 100 mL IVPB     300 mg 200 mL/hr over 30 Minutes Intravenous 3 times per day 11/22/12 1359     11/22/12 1200  vancomycin (VANCOCIN) IVPB 1000 mg/200 mL premix     1,000 mg 200 mL/hr over 60 Minutes Intravenous STAT 11/22/12 1114 11/22/12 1347   11/22/12 1200  ceFAZolin (ANCEF) IVPB 2 g/50 mL premix     2 g 100 mL/hr over 30 Minutes Intravenous STAT 11/22/12 1138 11/22/12 1316   11/21/12 1200  vancomycin (VANCOCIN) 500 mg in sodium chloride 0.9 % 100 mL IVPB  Status:  Discontinued     500 mg 100 mL/hr over 60 Minutes Intravenous Every M-W-F (Hemodialysis) 11/21/12 0928 11/23/12 0909   11/21/12 0930  vancomycin (VANCOCIN) 1,250 mg in sodium chloride 0.9 % 250 mL IVPB     1,250 mg 166.7 mL/hr over 90 Minutes Intravenous STAT 11/21/12 0927 11/22/12 0930   11/20/12 1800  cefTRIAXone (ROCEPHIN) 1 g in dextrose 5 % 50 mL IVPB  Status:   Discontinued     1 g 100 mL/hr over 30 Minutes Intravenous Every 24 hours 11/20/12 1756 11/22/12 1120   11/20/12 1800  vancomycin (VANCOCIN) 50 mg/mL oral solution 500 mg     500 mg Oral 4 times per day 11/20/12 1756 12/04/12 1759   11/20/12 1756  metroNIDAZOLE (FLAGYL) IVPB 500 mg  Status:  Discontinued     500 mg 100 mL/hr over 60 Minutes Intravenous Every 8 hours 11/20/12 1756 11/22/12 1400   11/20/12 1545  metroNIDAZOLE (FLAGYL) IVPB 500 mg     500 mg 100 mL/hr over 60 Minutes Intravenous  Once 11/20/12 1535 11/20/12 1725   11/20/12 1545  cefTRIAXone (ROCEPHIN) 1 g in dextrose 5 % 50 mL IVPB     1 g 100 mL/hr over 30 Minutes Intravenous  Once 11/20/12 1538 11/20/12 1724      Medications: Scheduled Meds: . amLODipine  5 mg Oral QPM  . aspirin EC  81 mg Oral Daily  . calcium carbonate  1 tablet Oral TID WC  . Chlorhexidine Gluconate Cloth  6 each Topical Q0600  . darbepoetin  25 mcg Intravenous Q Mon-HD  . doxercalciferol  4 mcg Intravenous  Q M,W,F-HD  . famotidine (PEPCID) IV  20 mg Intravenous Q24H  . feeding supplement (NEPRO CARB STEADY)  237 mL Oral TID BM  . folic acid  0.5 mg Oral Daily  . gentamicin  60 mg Intravenous Q M,W,F-HD  . insulin aspart  0-9 Units Subcutaneous TID WC  . levothyroxine  75 mcg Oral QAC breakfast  . metoprolol  75 mg Oral BID  . multivitamin  1 tablet Oral QHS  . nafcillin IV  2 g Intravenous Q4H  . rifampin (RIFADIN) IVPB  300 mg Intravenous Q8H  . sodium chloride  3 mL Intravenous Q12H  . vancomycin  500 mg Oral Q6H  . vitamin C  500 mg Oral Daily   Continuous Infusions: . sodium chloride 10 mL/hr at 11/26/12 2000   PRN Meds:.acetaminophen, acetaminophen, hydrALAZINE, HYDROcodone-acetaminophen, ondansetron (ZOFRAN) IV   Objective: Weight change:   Intake/Output Summary (Last 24 hours) at 11/27/12 1717 Last data filed at 11/27/12 1122  Gross per 24 hour  Intake    133 ml  Output  -1756 ml  Net   1889 ml   Blood pressure  156/54, pulse 81, temperature 98.2 F (36.8 C), temperature source Oral, resp. rate 15, height 5\' 2"  (1.575 m), weight 114 lb 13.8 oz (52.1 kg), SpO2 100.00%. Temp:  [98 F (36.7 C)-98.8 F (37.1 C)] 98.2 F (36.8 C) (11/23 1122) Pulse Rate:  [75-90] 81 (11/23 1122) Resp:  [15-33] 15 (11/23 1122) BP: (77-165)/(38-63) 156/54 mmHg (11/23 1122) SpO2:  [100 %] 100 % (11/23 1122) Weight:  [114 lb 13.8 oz (52.1 kg)-120 lb 2.4 oz (54.5 kg)] 114 lb 13.8 oz (52.1 kg) (11/23 1122)  Physical Exam: General: Alert and awake, oriented person, recognzes son, falls back to sleep after much of interview CVS II/VI systolic murmur, , reg rate and rhythm  Chest: clear to auscultation bilaterally, no wheezing, rales or rhonchi  Abdomen: soft, tender to deep palpation of abdomen in epigatrium, nondistended, normal bowel sounds,  Extremities: Both knees are without effusions right knee slightly more warm the left but not significantly. Examination of her feet failed to show any evidence of active infection she has prior indication site.  Skin: Healing lesion over her nares her AV fistula site is not grossly periodic. She does not have any Janeway or Osler's nodes  Neuro: nonfocal,    Lab Results:  Recent Labs  11/26/12 0930 11/27/12 0355  WBC 22.0* 22.9*  HGB 9.9* 10.1*  HCT 30.7* 31.1*  PLT 182 188    BMET  Recent Labs  11/26/12 0547 11/27/12 0355  NA 134* 132*  K 3.7 3.6  CL 96 95*  CO2 24 25  GLUCOSE 147* 165*  BUN 19 30*  CREATININE 2.88* 3.84*  CALCIUM 8.8 8.5    Micro Results: Recent Results (from the past 240 hour(s))  CULTURE, BLOOD (ROUTINE X 2)     Status: None   Collection Time    11/20/12  1:39 PM      Result Value Range Status   Specimen Description BLOOD RIGHT ARM   Final   Special Requests BOTTLES DRAWN AEROBIC AND ANAEROBIC 4CC   Final   Culture  Setup Time     Final   Value: 11/20/2012 17:58     Performed at [facility]   Culture     Final   Value:  STAPHYLOCOCCUS AUREUS     Note: RIFAMPIN AND GENTAMICIN SHOULD NOT BE USED AS SINGLE DRUGS FOR TREATMENT OF STAPH INFECTIONS.  Note: Gram Stain Report Called to,Read Back By and Verified With: Bonnee Quin RN on 11/21/12 at 03:10 by Christie Nottingham     Performed at [facility]   Report Status 11/23/2012 FINAL   Final   Organism ID, Bacteria STAPHYLOCOCCUS AUREUS   Final  CULTURE, BLOOD (ROUTINE X 2)     Status: None   Collection Time    11/20/12  2:19 PM      Result Value Range Status   Specimen Description BLOOD RIGHT HAND   Final   Special Requests BOTTLES DRAWN AEROBIC AND ANAEROBIC 10CC   Final   Culture  Setup Time     Final   Value: 11/20/2012 17:58     Performed at [facility]   Culture     Final   Value: STAPHYLOCOCCUS AUREUS     Note: SUSCEPTIBILITIES PERFORMED ON PREVIOUS CULTURE WITHIN THE LAST 5 DAYS.     Note: Gram Stain Report Called to,Read Back By and Verified With: Bonnee Quin RN on 11/21/12 at 03:10 by Christie Nottingham     Performed at [facility]   Report Status 11/23/2012 FINAL   Final  URINE CULTURE     Status: None   Collection Time    11/20/12  3:05 PM      Result Value Range Status   Specimen Description URINE, CATHETERIZED   Final   Special Requests NONE   Final   Culture  Setup Time     Final   Value: 11/20/2012 20:00     Performed at [facility]   Culture     Final   Value: 50,000 COLONIES/mL LACTOBACILLUS SPECIES     Note: Standardized susceptibility testing for this organism is not available.     Performed at [facility]   Report Status 11/21/2012 FINAL   Final  MRSA PCR SCREENING     Status: Abnormal   Collection Time    11/20/12  5:52 PM      Result Value Range Status   MRSA by PCR POSITIVE (*) NEGATIVE Final   Comment:            The GeneXpert MRSA Assay (FDA     approved for NASAL specimens     only), is one component of a     comprehensive MRSA colonization     surveillance program. It is not      intended to diagnose MRSA     infection nor to guide or     monitor treatment for     MRSA infections.     RESULT CALLED TO, READ BACK BY AND VERIFIED WITH:     GRACOU,R RN 1949 11/20/12 MITCHELL,L  CLOSTRIDIUM DIFFICILE BY PCR     Status: Abnormal   Collection Time    11/20/12  8:27 PM      Result Value Range Status   C difficile by pcr POSITIVE (*) NEGATIVE Final   Comment: CRITICAL RESULT CALLED TO, READ BACK BY AND VERIFIED WITH:     PETTIFORD A RN 11/21/12 0805 COSTELLO B  CULTURE, BLOOD (ROUTINE X 2)     Status: None   Collection Time    11/22/12 12:20 PM      Result Value Range Status   Specimen Description BLOOD RIGHT HAND   Final   Special Requests BOTTLES DRAWN AEROBIC AND ANAEROBIC 10CC   Final   Culture  Setup Time     Final   Value: 11/22/2012 18:09  Performed at [facility]     Final   Value:        BLOOD CULTURE RECEIVED NO GROWTH TO DATE CULTURE WILL BE HELD FOR 5 DAYS BEFORE ISSUING A FINAL NEGATIVE REPORT     Performed at [facility]   Report Status PENDING   Incomplete  CULTURE, BLOOD (ROUTINE X 2)     Status: None   Collection Time    11/22/12 12:32 PM      Result Value Range Status   Specimen Description BLOOD RIGHT HAND   Final   Special Requests BOTTLES DRAWN AEROBIC AND ANAEROBIC 10CC   Final   Culture  Setup Time     Final   Value: 11/22/2012 18:10     Performed at [facility]   Culture     Final   Value:        BLOOD CULTURE RECEIVED NO GROWTH TO DATE CULTURE WILL BE HELD FOR 5 DAYS BEFORE ISSUING A FINAL NEGATIVE REPORT     Performed at [facility]   Report Status PENDING   Incomplete    Studies/Results: Dg Abd Portable 2v  11/26/2012   CLINICAL DATA:  Ileus  EXAM: PORTABLE ABDOMEN - 2 VIEW  COMPARISON:  06/10/2010  FINDINGS: Nonobstructive bowel gas pattern. No evidence of ileus. No organomegaly or suspicious calcification. Vascular calcifications without visible aneurysm. No free air  organomegaly. Scoliosis and degenerative changes in the lumbar spine.  IMPRESSION: No evidence of obstruction or ileus.   Electronically Signed   By: Charlett Nose M.D.   On: 11/26/2012 20:38      Assessment/Plan: [name] is a 79 y.o. female with 79 year old lady with history of diabetes mellitus coronary artery disease valvular heart disease status post coronary artery bypass grafting aortic valve replacement in 2009 and stage renal disease on hemodialysis via AV fistula now with staph aureus bacteremia with concern for infection of her aortic valve or one of her native valves. She has had recent surgery to remove basal cell carcinoma.    #1 Methicillin Sensitive Staphylococcus Aureus Bacteremia, rule out endocarditis: 2 d echo not showing vegetations, Dr. Edilia Bo feels AVF not infected overtly  --i changed to high dose nafcillin yesterday due to my concerns taht she might have PVE with emboli to CNS  -she is to have TEE tomorrow or Tuesday  --if she has endocarditis she will need 6 weeks of beta lactam antibiotics post clearance of her blood cultures and  6 weeks of rifampin  She will also need two weeks total of gentamicin  I spent greater than 45 minutes with the patient including greater than 50% of time in face to face counsel of the patient and in coordination of their care.   #2 C difficile colitis: -continue vancomycin at 500mg  qid  Thru duration of her other antibiotics + additional 2 weeks  --she will be at significant risk of relapse while on abx for bacteremia, possible AVF and possible prosthetic and or native valve infection      LOS: 7 days   Acey Lav 11/27/2012, 5:17 PM

## 2012-11-27 NOTE — Progress Notes (Signed)
Patient ID: [name], female   DOB: 1920/12/11, 79 y.o.   MRN: [id number] Our team has been contacted to try to arrange for a TEE. The schedule for November 24 is not yet available to me. I have made the patient n.p.o. We will try to finalize scheduling early tomorrow morning. If the patient is not to have TEE in the morning, our team will let you know, and the patient will be able to keep breakfast.  Jerral Bonito, MD

## 2012-11-27 NOTE — Progress Notes (Signed)
[facility] Progress Note  Subjective:   Sleepy post HD. Awakens easily, orientation is at baseline. Poor appetite. Son at bedside encouraging nutrition. No c/o's. Just "want to get well." Still with diarrhea (rectal tube). Afebrile  Objective Filed Vitals:   11/27/12 1000 11/27/12 1030 11/27/12 1100 11/27/12 1122  BP: 107/47 94/42 109/45 156/54  Pulse: 83 83 81 81  Temp:    98.2 F (36.8 C)  TempSrc:    Oral  Resp: 23 22 28 15   Height:      Weight:    52.1 kg (114 lb 13.8 oz)  SpO2:    100%   Physical Exam General: elderly, still quite weak, NAD Heart: RRR, soft murmur, prosthetic valve Lungs: CTA bilat, no wheezes, rhonchi Abdomen: Soft, mild diffuse tenderness, + BS.  Extremities: No LE edema Dialysis Access: L AVF + bruit  Dialysis orders: MWF at [facility]  3hrs 50.5kg 2K/2.25Ca 400/800 L arm AVF Heparin 4200  Hectorol 4ug Epo 1800 Venofer none   Assessment/Plan: 1. MSSA bacteremia / hx prosthetic AVR: TTE negative for vegetation, on ancef/gent/rifampin per ID, vasc surgery has eval AV fistula and no signs of infection; not responding well clinically, though temp is down, ^ wbc but stable since yesterday.  TEE tentatively planned for tomorrow per Cardiology 2. Cdif+ diarrhea: on po vanc, ID following 3. ESRD: MWF. HD today (holiday sched) then again on Tuesday. K + 3.6 with am labs. Rec'd added K+ bath with HD. Follow labs 4. Anemia of CKD: Hb 10.1, op tsat 40%, Cont esa w darbe 25 on wed 5. MBD (metabolic bone disease) - P 3.1, op pth 216, cont vit D and Tums  6. HTN/volume - Vol up. On norvasc and metoprolol.  Did not tolerate UF with HD today d/t SBPs 70s-90s. Now BP back up in the 150s, Has lost body wgt. 7. Nutrition: On full liquids, nepro, multivitamin 8. DM2 9. CABG/AVR 2009  Karen E. Broadus John, PA-C [facility] Pager [phone] 11/27/2012,1:46 PM  LOS: 7 days   I have seen and examined patient, discussed with PA and agree with  assessment and plan as outlined above. Vinson Moselle MD pager [phone]    cell [phone] 11/27/2012, 3:06 PM    Additional Objective Labs: Basic Metabolic Panel:  Recent Labs Lab 11/21/12 0507  11/25/12 0808 11/26/12 0547 11/27/12 0355  NA 132*  < > 132* 134* 132*  K 4.3  < > 3.9 3.7 3.6  CL 93*  < > 93* 96 95*  CO2 18*  < > 22 24 25   GLUCOSE 116*  < > 157* 147* 165*  BUN 84*  < > 32* 19 30*  CREATININE 7.06*  < > 3.81* 2.88* 3.84*  CALCIUM 8.8  < > 9.3 8.8 8.5  PHOS 5.5*  --  2.8  --  3.1  < > = values in this interval not displayed. Liver Function Tests:  Recent Labs Lab 11/22/12 0720 11/25/12 0808 11/27/12 0355  AST 123*  --   --   ALT 69*  --   --   ALKPHOS 102  --   --   BILITOT 0.3  --   --   PROT 6.1  --   --   ALBUMIN 2.3* 2.4* 1.9*   CBC:  Recent Labs Lab 11/21/12 0507 11/22/12 0720 11/26/12 0547 11/26/12 0930 11/27/12 0355  WBC 16.6* 11.2* 20.1* 22.0* 22.9*  HGB 10.2* 9.9* 10.3* 9.9* 10.1*  HCT 30.6* 28.8* 32.2* 30.7* 31.1*  MCV 101.7*  100.0 104.9* 104.8* 103.7*  PLT 104* 98* 176 182 188   Blood Culture    Component Value Date/Time   SDES BLOOD RIGHT HAND 11/22/2012 1232   SPECREQUEST BOTTLES DRAWN AEROBIC AND ANAEROBIC 10CC 11/22/2012 1232   CULT  Value:        BLOOD CULTURE RECEIVED NO GROWTH TO DATE CULTURE WILL BE HELD FOR 5 DAYS BEFORE ISSUING A FINAL NEGATIVE REPORT Performed at [facility] 11/22/2012 1232   REPTSTATUS PENDING 11/22/2012 1232    Cardiac Enzymes: No results found for this basename: CKTOTAL, CKMB, CKMBINDEX, TROPONINI,  in the last 168 hours CBG:  Recent Labs Lab 11/26/12 0815 11/26/12 1114 11/26/12 1709 11/26/12 2142 11/27/12 1257  GLUCAP 157* 154* 253* 160* 128*   IStudies/Results: Dg Abd Portable 2v  11/26/2012   CLINICAL DATA:  Ileus  EXAM: PORTABLE ABDOMEN - 2 VIEW  COMPARISON:  06/10/2010  FINDINGS: Nonobstructive bowel gas pattern. No evidence of ileus. No organomegaly or suspicious  calcification. Vascular calcifications without visible aneurysm. No free air organomegaly. Scoliosis and degenerative changes in the lumbar spine.  IMPRESSION: No evidence of obstruction or ileus.   Electronically Signed   By: Charlett Nose M.D.   On: 11/26/2012 20:38   Medications: . sodium chloride 10 mL/hr at 11/26/12 2000   . amLODipine  5 mg Oral QPM  . aspirin EC  81 mg Oral Daily  . calcium carbonate  1 tablet Oral TID WC  . Chlorhexidine Gluconate Cloth  6 each Topical Q0600  . darbepoetin  25 mcg Intravenous Q Mon-HD  . doxercalciferol  4 mcg Intravenous Q M,W,F-HD  . famotidine (PEPCID) IV  20 mg Intravenous Q24H  . feeding supplement (NEPRO CARB STEADY)  237 mL Oral TID BM  . folic acid  0.5 mg Oral Daily  . gentamicin  60 mg Intravenous Q M,W,F-HD  . insulin aspart  0-9 Units Subcutaneous TID WC  . levothyroxine  75 mcg Oral QAC breakfast  . metoprolol  75 mg Oral BID  . multivitamin  1 tablet Oral QHS  . nafcillin IV  2 g Intravenous Q4H  . rifampin (RIFADIN) IVPB  300 mg Intravenous Q8H  . sodium chloride  3 mL Intravenous Q12H  . vancomycin  500 mg Oral Q6H  . vitamin C  500 mg Oral Daily

## 2012-11-27 NOTE — Progress Notes (Signed)
[facility] Progress Note [place] TEAM 1 - Stepdown/ICU TEAM   [name] JYN:[id number] DOB: [birth year] DOA: 11/20/2012 PCP: Tillman Abide, MD  Admit HPI / Brief Narrative: 79 y.o. female with past medical history of end-stage renal disease, diabetes mellitus, CAD and prior C. difficile who despite her advanced age and medical problems is rather functional and lives alone with her son nearby. Patient had been in her usual state of health when she complained to her son about chills 3 days prior to admit. She went to dialysis and had a low-grade temp. Dialysis was done partially on that day. Patient visited her PCP and a chest x-ray was unremarkable. She spent the weekend with her son, and was brought to the ED when she was noted to be progressively weaker with diarrhea.  In the emergency room the patient was noted to have a lactic acid level 11.9, anion gap of 24 and a glucose of 206. Her white count was 13.2-92% shift and her urine was positive for UTI. Stool studies were sent for C. difficile as was urine culture and patient was started on IV Rocephin and Flagyl.   Assessment/Plan:  Sepsis -  UTI, Staph bacteremia, and C diff -  - WBC counts noted to be rising- Pt more alert than yesterday and has been eating well per son  - check UA - c diff relapse? not much stool output or abd pain  - appreciate management by ID  MRSA bacteremia - 2/2 blood cx + Cont abx per ID - TTE w/o overt vegetations - to have TEE per ID   C diff colitis  Oral vanc - follow clinically - avoid PPI - will require prolonged tx due to need for concomitant abx   Epigastric tenderness - likely gastritis- may be contributing to poor appetite-  started Pepcid BID  UTI Lactobacillus species w/ < 50, 000 colonies - recheck UA today due to rising WBC count  ESRD on HD  Nephrology is following - dialysis access (fistula) does not appear to be infected - Vasc Surgery has seen and agrees   Anemia of  CKD Follow hemoglobin trend - no evidence of acute blood loss  CAD w/ Hx CABG Denies chest pain  DM2 CBG reasonably controlled at the present time - follow w/o change today   HTN Cont to follow w/o change in tx plan today   Pulmonary HTN  GERD   S/P AoVR Does not require chronic anticoagulation due to use of bioprosthetic valve - at risk for endocarditis  Moderate Protein Calorie malnutrition -encourage PO intake- Nepro shakes-  Magic cups - allow son to bring food from outside  Code Status: NO CODE BLUE Family Communication: with son Disposition Plan: SDU  Consultants: Nephrology  ID Vasc Surgery   Procedures: TTE   Antibiotics: Vanc 11/16 >> Rifampin 11/18 >> Gentamicin 11/18 >> Ancef 11/18>> Rocephin 11/16 >>11/17 Flagyl 11/16 >>11/18 DVT prophylaxis: SCDs  HPI/Subjective: Pt alert. Has no complaints- quite alert. She still dose not have an appetite but has been making herself eat- epigastric pain is still present but not prohibiting her from eating.  Objective: Blood pressure 109/45, pulse 81, temperature 98.3 F (36.8 C), temperature source Oral, resp. rate 28, height 5\' 2"  (1.575 m), weight 54.5 kg (120 lb 2.4 oz), SpO2 100.00%.  Intake/Output Summary (Last 24 hours) at 11/27/12 1137 Last data filed at 11/26/12 2042  Gross per 24 hour  Intake    463 ml  Output  0 ml  Net    463 ml   Exam: General: awake, alert and oriented x 3, No acute respiratory distress at rest  Lungs: Clear to auscultation bilaterally without wheezes or crackles Cardiovascular: Regular rate and rhythm without gallop or rub normal S1 and S2 Abdomen: tender in epigastrium, nondistended, soft, bowel sounds positive, no rebound, no ascites, no appreciable mass- rectal bag has small amount of brown stool  Extremities: No significant cyanosis, clubbing, edema bilateral lower extremities  Data Reviewed: Basic Metabolic Panel:  Recent Labs Lab 11/21/12 0507 11/22/12 0720  11/25/12 0808 11/26/12 0547 11/27/12 0355  NA 132* 133* 132* 134* 132*  K 4.3 3.3* 3.9 3.7 3.6  CL 93* 94* 93* 96 95*  CO2 18* 23 22 24 25   GLUCOSE 116* 101* 157* 147* 165*  BUN 84* 35* 32* 19 30*  CREATININE 7.06* 3.76* 3.81* 2.88* 3.84*  CALCIUM 8.8 8.5 9.3 8.8 8.5  PHOS 5.5*  --  2.8  --  3.1   Liver Function Tests:  Recent Labs Lab 11/20/12 1339 11/22/12 0720 11/25/12 0808 11/27/12 0355  AST 78* 123*  --   --   ALT 42* 69*  --   --   ALKPHOS 133* 102  --   --   BILITOT 0.7 0.3  --   --   PROT 8.3 6.1  --   --   ALBUMIN 3.5 2.3* 2.4* 1.9*    Recent Labs Lab 11/20/12 1339  LIPASE 15   CBC:  Recent Labs Lab 11/20/12 1339 11/21/12 0507 11/22/12 0720 11/26/12 0547 11/26/12 0930 11/27/12 0355  WBC 13.3* 16.6* 11.2* 20.1* 22.0* 22.9*  NEUTROABS 12.4*  --   --   --   --   --   HGB 11.6* 10.2* 9.9* 10.3* 9.9* 10.1*  HCT 34.8* 30.6* 28.8* 32.2* 30.7* 31.1*  MCV 101.8* 101.7* 100.0 104.9* 104.8* 103.7*  PLT 150 104* 98* 176 182 188   CBG:  Recent Labs Lab 11/25/12 2139 11/26/12 0815 11/26/12 1114 11/26/12 1709 11/26/12 2142  GLUCAP 208* 157* 154* 253* 160*    Recent Results (from the past 240 hour(s))  CULTURE, BLOOD (ROUTINE X 2)     Status: None   Collection Time    11/20/12  1:39 PM      Result Value Range Status   Specimen Description BLOOD RIGHT ARM   Final   Special Requests BOTTLES DRAWN AEROBIC AND ANAEROBIC 4CC   Final   Culture  Setup Time     Final   Value: 11/20/2012 17:58     Performed at [facility]   Culture     Final   Value: STAPHYLOCOCCUS AUREUS     Note: RIFAMPIN AND GENTAMICIN SHOULD NOT BE USED AS SINGLE DRUGS FOR TREATMENT OF STAPH INFECTIONS.     Note: Gram Stain Report Called to,Read Back By and Verified With: Bonnee Quin RN on 11/21/12 at 03:10 by Christie Nottingham     Performed at [facility]   Report Status 11/23/2012 FINAL   Final   Organism ID, Bacteria STAPHYLOCOCCUS AUREUS   Final  CULTURE, BLOOD  (ROUTINE X 2)     Status: None   Collection Time    11/20/12  2:19 PM      Result Value Range Status   Specimen Description BLOOD RIGHT HAND   Final   Special Requests BOTTLES DRAWN AEROBIC AND ANAEROBIC 10CC   Final   Culture  Setup Time     Final  Value: 11/20/2012 17:58     Performed at [facility]   Culture     Final   Value: STAPHYLOCOCCUS AUREUS     Note: SUSCEPTIBILITIES PERFORMED ON PREVIOUS CULTURE WITHIN THE LAST 5 DAYS.     Note: Gram Stain Report Called to,Read Back By and Verified With: Bonnee Quin RN on 11/21/12 at 03:10 by Christie Nottingham     Performed at [facility]   Report Status 11/23/2012 FINAL   Final  URINE CULTURE     Status: None   Collection Time    11/20/12  3:05 PM      Result Value Range Status   Specimen Description URINE, CATHETERIZED   Final   Special Requests NONE   Final   Culture  Setup Time     Final   Value: 11/20/2012 20:00     Performed at [facility]   Culture     Final   Value: 50,000 COLONIES/mL LACTOBACILLUS SPECIES     Note: Standardized susceptibility testing for this organism is not available.     Performed at [facility]   Report Status 11/21/2012 FINAL   Final  MRSA PCR SCREENING     Status: Abnormal   Collection Time    11/20/12  5:52 PM      Result Value Range Status   MRSA by PCR POSITIVE (*) NEGATIVE Final   Comment:            The GeneXpert MRSA Assay (FDA     approved for NASAL specimens     only), is one component of a     comprehensive MRSA colonization     surveillance program. It is not     intended to diagnose MRSA     infection nor to guide or     monitor treatment for     MRSA infections.     RESULT CALLED TO, READ BACK BY AND VERIFIED WITH:     GRACOU,R RN 1949 11/20/12 MITCHELL,L  CLOSTRIDIUM DIFFICILE BY PCR     Status: Abnormal   Collection Time    11/20/12  8:27 PM      Result Value Range Status   C difficile by pcr POSITIVE (*) NEGATIVE Final   Comment: CRITICAL RESULT  CALLED TO, READ BACK BY AND VERIFIED WITH:     PETTIFORD A RN 11/21/12 0805 COSTELLO B  CULTURE, BLOOD (ROUTINE X 2)     Status: None   Collection Time    11/22/12 12:20 PM      Result Value Range Status   Specimen Description BLOOD RIGHT HAND   Final   Special Requests BOTTLES DRAWN AEROBIC AND ANAEROBIC 10CC   Final   Culture  Setup Time     Final   Value: 11/22/2012 18:09     Performed at [facility]   Culture     Final   Value:        BLOOD CULTURE RECEIVED NO GROWTH TO DATE CULTURE WILL BE HELD FOR 5 DAYS BEFORE ISSUING A FINAL NEGATIVE REPORT     Performed at [facility]   Report Status PENDING   Incomplete  CULTURE, BLOOD (ROUTINE X 2)     Status: None   Collection Time    11/22/12 12:32 PM      Result Value Range Status   Specimen Description BLOOD RIGHT HAND   Final   Special Requests BOTTLES DRAWN AEROBIC AND ANAEROBIC 10CC   Final  Culture  Setup Time     Final   Value: 11/22/2012 18:10     Performed at [facility]   Culture     Final   Value:        BLOOD CULTURE RECEIVED NO GROWTH TO DATE CULTURE WILL BE HELD FOR 5 DAYS BEFORE ISSUING A FINAL NEGATIVE REPORT     Performed at [facility]   Report Status PENDING   Incomplete     Studies:  Recent x-ray studies have been reviewed in detail by the Attending Physician  Scheduled Meds:  Scheduled Meds: . amLODipine  5 mg Oral QPM  . aspirin EC  81 mg Oral Daily  . calcium carbonate  1 tablet Oral TID WC  . Chlorhexidine Gluconate Cloth  6 each Topical Q0600  . darbepoetin  25 mcg Intravenous Q Mon-HD  . doxercalciferol  4 mcg Intravenous Q M,W,F-HD  . famotidine (PEPCID) IV  20 mg Intravenous Q24H  . feeding supplement (NEPRO CARB STEADY)  237 mL Oral TID BM  . folic acid  0.5 mg Oral Daily  . gentamicin  60 mg Intravenous Q M,W,F-HD  . [START ON 11/28/2012] heparin  4,200 Units Dialysis Once in dialysis  . insulin aspart  0-9 Units Subcutaneous TID WC  . levothyroxine  75  mcg Oral QAC breakfast  . metoprolol  75 mg Oral BID  . multivitamin  1 tablet Oral QHS  . nafcillin IV  2 g Intravenous Q4H  . rifampin (RIFADIN) IVPB  300 mg Intravenous Q8H  . sodium chloride  3 mL Intravenous Q12H  . vancomycin  500 mg Oral Q6H  . vitamin C  500 mg Oral Daily    Time spent on care of this patient: 35 mins   Calvert Cantor, MD  [facility] Office  [phone] Pager - Text Page per Loretha Stapler as per below:  On-Call/Text Page:      [email or web address]      password [email or web address]  If 7PM-7AM, please contact night-coverage [email or web address] Password [facility] 11/27/2012, 11:37 AM   LOS: 7 days

## 2012-11-27 NOTE — Progress Notes (Signed)
UA ordered. Pt not voiding, bladder scanner showing 36 ml. Straight cath drained 30 ml thick, cloudy, tea colored urine.

## 2012-11-28 ENCOUNTER — Encounter (HOSPITAL_COMMUNITY): Payer: Self-pay | Admitting: *Deleted

## 2012-11-28 ENCOUNTER — Encounter (HOSPITAL_COMMUNITY)
Admission: EM | Disposition: A | Discharge status: Skilled Nursing Facility | Payer: Self-pay | Source: Home / Self Care | Attending: Internal Medicine

## 2012-11-28 LAB — CULTURE, BLOOD (ROUTINE X 2)
Culture: NO GROWTH
Culture: NO GROWTH

## 2012-11-28 LAB — RENAL FUNCTION PANEL
Albumin: 1.8 g/dL — ABNORMAL LOW (ref 3.5–5.2)
CO2: 25 mEq/L (ref 19–32)
Calcium: 8.2 mg/dL — ABNORMAL LOW (ref 8.4–10.5)
GFR calc Af Amer: 19 mL/min — ABNORMAL LOW (ref >90)
Phosphorus: 3.4 mg/dL (ref 2.3–4.6)
Potassium: 3.7 mEq/L (ref 3.5–5.1)
Sodium: 132 mEq/L — ABNORMAL LOW (ref 135–145)

## 2012-11-28 LAB — CBC
HCT: 31.4 % — ABNORMAL LOW (ref 36.0–46.0)
Hemoglobin: 10.2 g/dL — ABNORMAL LOW (ref 12.0–15.0)
MCH: 34 pg (ref 26.0–34.0)
MCHC: 32.5 g/dL (ref 30.0–36.0)
RDW: 15.4 % (ref 11.5–15.5)

## 2012-11-28 LAB — GLUCOSE, CAPILLARY
Glucose-Capillary: 180 mg/dL — ABNORMAL HIGH (ref 70–99)
Glucose-Capillary: 155 mg/dL — ABNORMAL HIGH (ref 70–99)

## 2012-11-28 SURGERY — ECHOCARDIOGRAM, TRANSESOPHAGEAL
Anesthesia: Moderate Sedation

## 2012-11-28 MED ORDER — PENTAFLUOROPROP-TETRAFLUOROETH EX AERO: 1.0000 "application " | INHALATION_SPRAY | CUTANEOUS | Status: DC | PRN

## 2012-11-28 MED ORDER — LIDOCAINE HCL (PF) 1 % IJ SOLN: 5.0000 mL | INTRAMUSCULAR | Status: DC | PRN

## 2012-11-28 MED ORDER — SODIUM CHLORIDE 0.9 % IV SOLN: 100.0000 mL | INTRAVENOUS | Status: DC | PRN

## 2012-11-28 MED ORDER — NEPRO/CARBSTEADY PO LIQD: 237.0000 mL | ORAL | Status: DC | PRN

## 2012-11-28 MED ORDER — GENTAMICIN IN SALINE 1.2-0.9 MG/ML-% IV SOLN
60.0000 mg | INTRAVENOUS | Status: DC
  Administered 2012-11-30 – 2012-12-05 (×3): 60 mg via INTRAVENOUS
  Filled 2012-11-28 (×4): qty 50

## 2012-11-28 MED ORDER — MIDAZOLAM HCL 5 MG/ML IJ SOLN
INTRAMUSCULAR | Status: AC
  Filled 2012-11-28: qty 2

## 2012-11-28 MED ORDER — DIPHENHYDRAMINE HCL 50 MG/ML IJ SOLN
INTRAMUSCULAR | Status: AC
  Filled 2012-11-28: qty 1

## 2012-11-28 MED ORDER — ALTEPLASE 2 MG IJ SOLR: 2.0000 mg | Freq: Once | INTRAMUSCULAR | Status: AC | PRN

## 2012-11-28 MED ORDER — FENTANYL CITRATE 0.05 MG/ML IJ SOLN
INTRAMUSCULAR | Status: AC
  Filled 2012-11-28: qty 2

## 2012-11-28 MED ORDER — GENTAMICIN IN SALINE 1.2-0.9 MG/ML-% IV SOLN
60.0000 mg | INTRAVENOUS | Status: DC | PRN
  Filled 2012-11-28 (×2): qty 50

## 2012-11-28 MED ORDER — HEPARIN SODIUM (PORCINE) 1000 UNIT/ML DIALYSIS: 1000.0000 [IU] | INTRAMUSCULAR | Status: DC | PRN

## 2012-11-28 MED ORDER — HEPARIN SODIUM (PORCINE) 1000 UNIT/ML DIALYSIS: 20.0000 [IU]/kg | INTRAMUSCULAR | Status: DC | PRN

## 2012-11-28 MED ORDER — SODIUM CHLORIDE 0.9 % IV SOLN
INTRAVENOUS | Status: DC
  Administered 2012-11-28: 11:00:00 via INTRAVENOUS

## 2012-11-28 MED ORDER — LIDOCAINE-PRILOCAINE 2.5-2.5 % EX CREA: 1.0000 "application " | TOPICAL_CREAM | CUTANEOUS | Status: DC | PRN

## 2012-11-28 NOTE — Progress Notes (Signed)
[facility] Progress Note [place] TEAM 1 - Stepdown/ICU TEAM   [name] EAV:[id number] DOB: [birth year] DOA: 11/20/2012 PCP: Tillman Abide, MD  Admit HPI / Brief Narrative: 79 y.o. female with past medical history of end-stage renal disease, diabetes mellitus, CAD and prior C. difficile who despite her advanced age and medical problems is rather functional and lives alone with her son nearby. Patient had been in her usual state of health when she complained to her son about chills 3 days prior to admit. She went to dialysis and had a low-grade temp. Dialysis was done partially on that day. Patient visited her PCP and a chest x-ray was unremarkable. She spent the weekend with her son, and was brought to the ED when she was noted to be progressively weaker with diarrhea.  In the emergency room the patient was noted to have a lactic acid level 11.9, anion gap of 24 and a glucose of 206. Her white count was 13.2-92% shift and her urine was positive for UTI. Stool studies were sent for C. difficile as was urine culture and patient was started on IV Rocephin and Flagyl.   Assessment/Plan:  Sepsis due to UTI, Staph bacteremia, and C diff - sepsis physiology is improving   MRSA bacteremia - 2/2 blood cx + Cont abx per ID - TTE w/o overt vegetations - to have TEE today per ID   C diff colitis  Oral vanc - follow clinically - avoid PPI - will require prolonged tx due to need for concomitant abx   Epigastric tenderness sx have improved with Pepcid BID, but pt also NPO at this time for TEE - follow when diet resumed   UTI Lactobacillus species w/ < 50, 000 colonies on original UA - repeat UA markedly c/w UTI - no change in abx today - f/u repeat culture results   ESRD on HD  Nephrology is following - dialysis access (fistula) does not appear to be infected - Vasc Surgery has seen and agrees   Anemia of CKD Follow hemoglobin trend - no evidence of acute blood loss  Macrocytosis   Check B12 and folic acid level   CAD w/ Hx CABG Asymptomatic   DM2 CBG reasonably controlled at the present time - follow w/o change today as pt is NPO for TEE  HTN Cont to follow w/o change in tx plan today   Pulmonary HTN  GERD   S/P AoVR Does not require chronic anticoagulation due to use of bioprosthetic valve - at risk for endocarditis - for TEE today   Moderate Protein Calorie malnutrition encourage PO intake - Nepro shakes - Magic cup - allow son to bring food from outside  Code Status: NO CODE BLUE Family Communication: no family present at time of exam today  Disposition Plan: stable for transfer to medical bed at this time, but will delay transfer until 11/25 so pt can recover from TEE in SDU - await TEE results - begin PT/OT - f/u urine cx   Consultants: Nephrology  ID Vasc Surgery   Procedures: TTE TEE - 11/24 - pending    Antibiotics: Vanc 11/16 >> Rifampin 11/18 >> Gentamicin 11/18 >> Nafcillin 11/22 >> Ancef 11/18>>11/21 Rocephin 11/16 >>11/17 Flagyl 11/16 >>11/18  DVT prophylaxis: SCDs  HPI/Subjective: Patient is much more alert than I have seen her previously.  She is in good spirits.  She reports that she is hungry.  She denies chest pain nausea vomiting or abdominal pain.  Objective: Blood pressure 147/48,  pulse 84, temperature 97.9 F (36.6 C), temperature source Oral, resp. rate 30, height 5\' 2"  (1.575 m), weight 54.8 kg (120 lb 13 oz), SpO2 98.00%.  Intake/Output Summary (Last 24 hours) at 11/28/12 1339 Last data filed at 11/28/12 1200  Gross per 24 hour  Intake 643.33 ml  Output     30 ml  Net 613.33 ml   Exam: General: awake, alert and oriented x 3, No acute respiratory distress Lungs: Clear to auscultation bilaterally without wheezes or crackles Cardiovascular: Regular rate and rhythm without gallop or rub normal S1 and S2 Abdomen: Nontender, nondistended, soft, bowel sounds positive, no rebound, no ascites, no appreciable  mass - rectal bag has small amount of brown stool  Extremities: No significant cyanosis, clubbing, edema bilateral lower extremities  Data Reviewed: Basic Metabolic Panel:  Recent Labs Lab 11/22/12 0720 11/25/12 0808 11/26/12 0547 11/27/12 0355 11/28/12 0445  NA 133* 132* 134* 132* 132*  K 3.3* 3.9 3.7 3.6 3.7  CL 94* 93* 96 95* 93*  CO2 23 22 24 25 25   GLUCOSE 101* 157* 147* 165* 199*  BUN 35* 32* 19 30* 18  CREATININE 3.76* 3.81* 2.88* 3.84* 2.49*  CALCIUM 8.5 9.3 8.8 8.5 8.2*  PHOS  --  2.8  --  3.1 3.4   Liver Function Tests:  Recent Labs Lab 11/22/12 0720 11/25/12 0808 11/27/12 0355 11/28/12 0445  AST 123*  --   --   --   ALT 69*  --   --   --   ALKPHOS 102  --   --   --   BILITOT 0.3  --   --   --   PROT 6.1  --   --   --   ALBUMIN 2.3* 2.4* 1.9* 1.8*   CBC:  Recent Labs Lab 11/22/12 0720 11/26/12 0547 11/26/12 0930 11/27/12 0355 11/28/12 0445  WBC 11.2* 20.1* 22.0* 22.9* 21.6*  HGB 9.9* 10.3* 9.9* 10.1* 10.2*  HCT 28.8* 32.2* 30.7* 31.1* 31.4*  MCV 100.0 104.9* 104.8* 103.7* 104.7*  PLT 98* 176 182 188 196   CBG:  Recent Labs Lab 11/27/12 1257 11/27/12 1723 11/27/12 2132 11/28/12 0807 11/28/12 1220  GLUCAP 128* 230* 226* 180* 107*    Recent Results (from the past 240 hour(s))  CULTURE, BLOOD (ROUTINE X 2)     Status: None   Collection Time    11/20/12  1:39 PM      Result Value Range Status   Specimen Description BLOOD RIGHT ARM   Final   Special Requests BOTTLES DRAWN AEROBIC AND ANAEROBIC 4CC   Final   Culture  Setup Time     Final   Value: 11/20/2012 17:58     Performed at [facility]   Culture     Final   Value: STAPHYLOCOCCUS AUREUS     Note: RIFAMPIN AND GENTAMICIN SHOULD NOT BE USED AS SINGLE DRUGS FOR TREATMENT OF STAPH INFECTIONS.     Note: Gram Stain Report Called to,Read Back By and Verified With: Bonnee Quin RN on 11/21/12 at 03:10 by Christie Nottingham     Performed at [facility]   Report Status 11/23/2012  FINAL   Final   Organism ID, Bacteria STAPHYLOCOCCUS AUREUS   Final  CULTURE, BLOOD (ROUTINE X 2)     Status: None   Collection Time    11/20/12  2:19 PM      Result Value Range Status   Specimen Description BLOOD RIGHT HAND   Final  Special Requests BOTTLES DRAWN AEROBIC AND ANAEROBIC 10CC   Final   Culture  Setup Time     Final   Value: 11/20/2012 17:58     Performed at [facility]   Culture     Final   Value: STAPHYLOCOCCUS AUREUS     Note: SUSCEPTIBILITIES PERFORMED ON PREVIOUS CULTURE WITHIN THE LAST 5 DAYS.     Note: Gram Stain Report Called to,Read Back By and Verified With: Bonnee Quin RN on 11/21/12 at 03:10 by Christie Nottingham     Performed at [facility]   Report Status 11/23/2012 FINAL   Final  URINE CULTURE     Status: None   Collection Time    11/20/12  3:05 PM      Result Value Range Status   Specimen Description URINE, CATHETERIZED   Final   Special Requests NONE   Final   Culture  Setup Time     Final   Value: 11/20/2012 20:00     Performed at [facility]   Culture     Final   Value: 50,000 COLONIES/mL LACTOBACILLUS SPECIES     Note: Standardized susceptibility testing for this organism is not available.     Performed at [facility]   Report Status 11/21/2012 FINAL   Final  MRSA PCR SCREENING     Status: Abnormal   Collection Time    11/20/12  5:52 PM      Result Value Range Status   MRSA by PCR POSITIVE (*) NEGATIVE Final   Comment:            The GeneXpert MRSA Assay (FDA     approved for NASAL specimens     only), is one component of a     comprehensive MRSA colonization     surveillance program. It is not     intended to diagnose MRSA     infection nor to guide or     monitor treatment for     MRSA infections.     RESULT CALLED TO, READ BACK BY AND VERIFIED WITH:     GRACOU,R RN 1949 11/20/12 MITCHELL,L  CLOSTRIDIUM DIFFICILE BY PCR     Status: Abnormal   Collection Time    11/20/12  8:27 PM      Result Value Range  Status   C difficile by pcr POSITIVE (*) NEGATIVE Final   Comment: CRITICAL RESULT CALLED TO, READ BACK BY AND VERIFIED WITH:     PETTIFORD A RN 11/21/12 0805 COSTELLO B  CULTURE, BLOOD (ROUTINE X 2)     Status: None   Collection Time    11/22/12 12:20 PM      Result Value Range Status   Specimen Description BLOOD RIGHT HAND   Final   Special Requests BOTTLES DRAWN AEROBIC AND ANAEROBIC 10CC   Final   Culture  Setup Time     Final   Value: 11/22/2012 18:09     Performed at [facility]   Culture     Final   Value: NO GROWTH 5 DAYS     Performed at [facility]   Report Status 11/28/2012 FINAL   Final  CULTURE, BLOOD (ROUTINE X 2)     Status: None   Collection Time    11/22/12 12:32 PM      Result Value Range Status   Specimen Description BLOOD RIGHT HAND   Final   Special Requests BOTTLES DRAWN AEROBIC AND ANAEROBIC 10CC  Final   Culture  Setup Time     Final   Value: 11/22/2012 18:10     Performed at [facility]   Culture     Final   Value: NO GROWTH 5 DAYS     Performed at [facility]   Report Status 11/28/2012 FINAL   Final     Studies:  Recent x-ray studies have been reviewed in detail by the Attending Physician  Scheduled Meds:  Scheduled Meds: . amLODipine  5 mg Oral QPM  . aspirin EC  81 mg Oral Daily  . calcium carbonate  1 tablet Oral TID WC  . darbepoetin  25 mcg Intravenous Q Mon-HD  . doxercalciferol  4 mcg Intravenous Q M,W,F-HD  . famotidine (PEPCID) IV  20 mg Intravenous Q24H  . feeding supplement (NEPRO CARB STEADY)  237 mL Oral TID BM  . folic acid  0.5 mg Oral Daily  . [START ON 11/30/2012] gentamicin  60 mg Intravenous Q M,W,F-HD  . insulin aspart  0-9 Units Subcutaneous TID WC  . levothyroxine  75 mcg Oral QAC breakfast  . metoprolol  75 mg Oral BID  . multivitamin  1 tablet Oral QHS  . nafcillin IV  2 g Intravenous Q4H  . rifampin (RIFADIN) IVPB  300 mg Intravenous Q8H  . sodium chloride  3 mL Intravenous  Q12H  . vancomycin  500 mg Oral Q6H  . vitamin C  500 mg Oral Daily    Time spent on care of this patient: 25 mins   [facility] T, MD  [facility] Office  [phone] Pager - Text Page per Loretha Stapler as per below:  On-Call/Text Page:      [email or web address]      password [email or web address]  If 7PM-7AM, please contact night-coverage [email or web address] Password [email or web address] 11/28/2012, 1:39 PM   LOS: 8 days

## 2012-11-28 NOTE — Progress Notes (Signed)
TEE scheduled 11/25 2-3 pm with Dr. Shirlee Latch. Orders written.

## 2012-11-28 NOTE — Progress Notes (Signed)
Assessment/Plan:  1. MSSA bacteremia / hx prosthetic AVR: TTE negative for vegetation, on ancef/gent/rifampin per ID,  AV fistula has no signs of infection; TEE tentatively planned for todayper Cardiology 2. Cdif+ diarrhea: on po vanc, ID following 3. ESRD: MWF. HD today (holiday sched) then again on Tuesday.  4. Anemia of CKD: Hb 10.1, op tsat 40%, Cont esa w darbe 25 on wed 5. HTN/volume - Vol up. On norvasc and metoprolol. Did not tolerate UF with HD today d/t SBPs 70s-90s. Now BP back up in the 150s, Has lost body wgt. 6. CABG/AVR 2009  Subjective: Interval History: For TEE  Objective: Vital signs in last 24 hours: Temp:  [98 F (36.7 C)-98.6 F (37 C)] 98.5 F (36.9 C) (11/24 0808) Pulse Rate:  [80-85] 82 (11/24 0808) Resp:  [15-33] 27 (11/24 0808) BP: (77-156)/(38-94) 132/40 mmHg (11/24 0808) SpO2:  [96 %-100 %] 96 % (11/23 2030) Weight:  [52.1 kg (114 lb 13.8 oz)-54.8 kg (120 lb 13 oz)] 54.8 kg (120 lb 13 oz) (11/24 0359) Weight change:   Intake/Output from previous day: 11/23 0701 - 11/24 0700 In: 990 [NG/GT:240; IV Piggyback:750] Out: -1726 [Urine:30] Intake/Output this shift:   General appearance: alert and cooperative GI: c/o mild tenderness but unimpressive exam Extremities: extremities normal, atraumatic, no cyanosis or edema AVF LUE without tenderness Lab Results:  Recent Labs  11/27/12 0355 11/28/12 0445  WBC 22.9* 21.6*  HGB 10.1* 10.2*  HCT 31.1* 31.4*  PLT 188 196   BMET:  Recent Labs  11/27/12 0355 11/28/12 0445  NA 132* 132*  K 3.6 3.7  CL 95* 93*  CO2 25 25  GLUCOSE 165* 199*  BUN 30* 18  CREATININE 3.84* 2.49*  CALCIUM 8.5 8.2*   No results found for this basename: PTH,  in the last 72 hours Iron Studies: No results found for this basename: IRON, TIBC, TRANSFERRIN, FERRITIN,  in the last 72 hours Studies/Results: Dg Abd Portable 2v  11/26/2012   CLINICAL DATA:  Ileus  EXAM: PORTABLE ABDOMEN - 2 VIEW  COMPARISON:  06/10/2010   FINDINGS: Nonobstructive bowel gas pattern. No evidence of ileus. No organomegaly or suspicious calcification. Vascular calcifications without visible aneurysm. No free air organomegaly. Scoliosis and degenerative changes in the lumbar spine.  IMPRESSION: No evidence of obstruction or ileus.   Electronically Signed   By: Charlett Nose M.D.   On: 11/26/2012 20:38    Scheduled: . amLODipine  5 mg Oral QPM  . aspirin EC  81 mg Oral Daily  . calcium carbonate  1 tablet Oral TID WC  . darbepoetin  25 mcg Intravenous Q Mon-HD  . doxercalciferol  4 mcg Intravenous Q M,W,F-HD  . famotidine (PEPCID) IV  20 mg Intravenous Q24H  . feeding supplement (NEPRO CARB STEADY)  237 mL Oral TID BM  . folic acid  0.5 mg Oral Daily  . gentamicin  60 mg Intravenous Q M,W,F-HD  . insulin aspart  0-9 Units Subcutaneous TID WC  . levothyroxine  75 mcg Oral QAC breakfast  . metoprolol  75 mg Oral BID  . multivitamin  1 tablet Oral QHS  . nafcillin IV  2 g Intravenous Q4H  . rifampin (RIFADIN) IVPB  300 mg Intravenous Q8H  . sodium chloride  3 mL Intravenous Q12H  . vancomycin  500 mg Oral Q6H  . vitamin C  500 mg Oral Daily     LOS: 8 days   Skylene Deremer C 11/28/2012,9:01 AM

## 2012-11-28 NOTE — Progress Notes (Signed)
[facility]   Day #3 nafcillin Day #7 rifampin Day # 7gentamicin Day #7oral vancomcyin   Subjective: Feels comfortable  Antibiotics:  Anti-infectives   Start     Dose/Rate Route Frequency Ordered Stop   11/30/12 1600  gentamicin (GARAMYCIN) IVPB 60 mg     60 mg 100 mL/hr over 30 Minutes Intravenous Every M-W-F (Hemodialysis) 11/28/12 0950     11/29/12 1600  gentamicin (GARAMYCIN) IVPB 60 mg     60 mg 100 mL/hr over 30 Minutes Intravenous Every Hemodialysis 11/28/12 0952     11/26/12 1800  nafcillin 2 g in dextrose 5 % 50 mL IVPB     2 g 100 mL/hr over 30 Minutes Intravenous 6 times per day 11/26/12 1710     11/23/12 2000  ceFAZolin (ANCEF) IVPB 1 g/50 mL premix  Status:  Discontinued     1 g 100 mL/hr over 30 Minutes Intravenous Every M-W-F (2000) 11/22/12 1138 11/22/12 1139   11/23/12 2000  ceFAZolin (ANCEF) IVPB 2 g/50 mL premix  Status:  Discontinued     2 g 100 mL/hr over 30 Minutes Intravenous Every M-W-F (2000) 11/22/12 1139 11/26/12 1710   11/23/12 1600  gentamicin (GARAMYCIN) IVPB 60 mg  Status:  Discontinued     60 mg 100 mL/hr over 30 Minutes Intravenous Every M-W-F (Hemodialysis) 11/22/12 1526 11/28/12 0950   11/22/12 1530  gentamicin (GARAMYCIN) IVPB 80 mg     80 mg 100 mL/hr over 30 Minutes Intravenous NOW 11/22/12 1524 11/22/12 1655   11/22/12 1500  rifampin (RIFADIN) 300 mg in sodium chloride 0.9 % 100 mL IVPB     300 mg 200 mL/hr over 30 Minutes Intravenous 3 times per day 11/22/12 1359     11/22/12 1200  vancomycin (VANCOCIN) IVPB 1000 mg/200 mL premix     1,000 mg 200 mL/hr over 60 Minutes Intravenous STAT 11/22/12 1114 11/22/12 1347   11/22/12 1200  ceFAZolin (ANCEF) IVPB 2 g/50 mL premix     2 g 100 mL/hr over 30 Minutes Intravenous STAT 11/22/12 1138 11/22/12 1316   11/21/12 1200  vancomycin (VANCOCIN) 500 mg in sodium chloride 0.9 % 100 mL IVPB  Status:  Discontinued     500 mg 100 mL/hr over 60 Minutes Intravenous  Every M-W-F (Hemodialysis) 11/21/12 0928 11/23/12 0909   11/21/12 0930  vancomycin (VANCOCIN) 1,250 mg in sodium chloride 0.9 % 250 mL IVPB     1,250 mg 166.7 mL/hr over 90 Minutes Intravenous STAT 11/21/12 0927 11/22/12 0930   11/20/12 1800  cefTRIAXone (ROCEPHIN) 1 g in dextrose 5 % 50 mL IVPB  Status:  Discontinued     1 g 100 mL/hr over 30 Minutes Intravenous Every 24 hours 11/20/12 1756 11/22/12 1120   11/20/12 1800  vancomycin (VANCOCIN) 50 mg/mL oral solution 500 mg     500 mg Oral 4 times per day 11/20/12 1756 12/04/12 1759   11/20/12 1756  metroNIDAZOLE (FLAGYL) IVPB 500 mg  Status:  Discontinued     500 mg 100 mL/hr over 60 Minutes Intravenous Every 8 hours 11/20/12 1756 11/22/12 1400   11/20/12 1545  metroNIDAZOLE (FLAGYL) IVPB 500 mg     500 mg 100 mL/hr over 60 Minutes Intravenous  Once 11/20/12 1535 11/20/12 1725   11/20/12 1545  cefTRIAXone (ROCEPHIN) 1 g in dextrose 5 % 50 mL IVPB     1 g 100 mL/hr over 30 Minutes Intravenous  Once 11/20/12 1538 11/20/12 1724  Medications: Scheduled Meds: . amLODipine  5 mg Oral QPM  . aspirin EC  81 mg Oral Daily  . calcium carbonate  1 tablet Oral TID WC  . darbepoetin  25 mcg Intravenous Q Mon-HD  . doxercalciferol  4 mcg Intravenous Q M,W,F-HD  . famotidine (PEPCID) IV  20 mg Intravenous Q24H  . feeding supplement (NEPRO CARB STEADY)  237 mL Oral TID BM  . folic acid  0.5 mg Oral Daily  . [START ON 11/30/2012] gentamicin  60 mg Intravenous Q M,W,F-HD  . insulin aspart  0-9 Units Subcutaneous TID WC  . levothyroxine  75 mcg Oral QAC breakfast  . metoprolol  75 mg Oral BID  . multivitamin  1 tablet Oral QHS  . nafcillin IV  2 g Intravenous Q4H  . rifampin (RIFADIN) IVPB  300 mg Intravenous Q8H  . sodium chloride  3 mL Intravenous Q12H  . vancomycin  500 mg Oral Q6H  . vitamin C  500 mg Oral Daily   Continuous Infusions: . sodium chloride 10 mL/hr at 11/26/12 2000  . sodium chloride 20 mL/hr at 11/28/12 1120   PRN  Meds:.sodium chloride, sodium chloride, acetaminophen, acetaminophen, alteplase, feeding supplement (NEPRO CARB STEADY), [START ON 11/29/2012] gentamicin, heparin, heparin, hydrALAZINE, HYDROcodone-acetaminophen, lidocaine (PF), lidocaine-prilocaine, ondansetron (ZOFRAN) IV, pentafluoroprop-tetrafluoroeth   Objective: Weight change:   Intake/Output Summary (Last 24 hours) at 11/28/12 1128 Last data filed at 11/28/12 0800  Gross per 24 hour  Intake    890 ml  Output     30 ml  Net    860 ml   Blood pressure 132/40, pulse 82, temperature 98.5 F (36.9 C), temperature source Oral, resp. rate 27, height 5\' 2"  (1.575 m), weight 120 lb 13 oz (54.8 kg), SpO2 96.00%. Temp:  [98 F (36.7 C)-98.6 F (37 C)] 98.5 F (36.9 C) (11/24 0808) Pulse Rate:  [80-85] 82 (11/24 0808) Resp:  [22-33] 27 (11/24 0808) BP: (126-137)/(39-94) 132/40 mmHg (11/24 0808) SpO2:  [96 %-97 %] 96 % (11/23 2030) Weight:  [120 lb 13 oz (54.8 kg)] 120 lb 13 oz (54.8 kg) (11/24 0359)  Physical Exam: General: Alert and awake, oriented person, place, month comfortable  Neuro: nonfocal,    Lab Results:  Recent Labs  11/27/12 0355 11/28/12 0445  WBC 22.9* 21.6*  HGB 10.1* 10.2*  HCT 31.1* 31.4*  PLT 188 196    BMET  Recent Labs  11/27/12 0355 11/28/12 0445  NA 132* 132*  K 3.6 3.7  CL 95* 93*  CO2 25 25  GLUCOSE 165* 199*  BUN 30* 18  CREATININE 3.84* 2.49*  CALCIUM 8.5 8.2*    Micro Results: Recent Results (from the past 240 hour(s))  CULTURE, BLOOD (ROUTINE X 2)     Status: None   Collection Time    11/20/12  1:39 PM      Result Value Range Status   Specimen Description BLOOD RIGHT ARM   Final   Special Requests BOTTLES DRAWN AEROBIC AND ANAEROBIC 4CC   Final   Culture  Setup Time     Final   Value: 11/20/2012 17:58     Performed at [facility]   Culture     Final   Value: STAPHYLOCOCCUS AUREUS     Note: RIFAMPIN AND GENTAMICIN SHOULD NOT BE USED AS SINGLE DRUGS FOR TREATMENT  OF STAPH INFECTIONS.     Note: Gram Stain Report Called to,Read Back By and Verified With: Bonnee Quin RN on 11/21/12 at 03:10 by Thurston Hole  Skeen     Performed at [facility]   Report Status 11/23/2012 FINAL   Final   Organism ID, Bacteria STAPHYLOCOCCUS AUREUS   Final  CULTURE, BLOOD (ROUTINE X 2)     Status: None   Collection Time    11/20/12  2:19 PM      Result Value Range Status   Specimen Description BLOOD RIGHT HAND   Final   Special Requests BOTTLES DRAWN AEROBIC AND ANAEROBIC 10CC   Final   Culture  Setup Time     Final   Value: 11/20/2012 17:58     Performed at [facility]   Culture     Final   Value: STAPHYLOCOCCUS AUREUS     Note: SUSCEPTIBILITIES PERFORMED ON PREVIOUS CULTURE WITHIN THE LAST 5 DAYS.     Note: Gram Stain Report Called to,Read Back By and Verified With: Bonnee Quin RN on 11/21/12 at 03:10 by Christie Nottingham     Performed at [facility]   Report Status 11/23/2012 FINAL   Final  URINE CULTURE     Status: None   Collection Time    11/20/12  3:05 PM      Result Value Range Status   Specimen Description URINE, CATHETERIZED   Final   Special Requests NONE   Final   Culture  Setup Time     Final   Value: 11/20/2012 20:00     Performed at [facility]   Culture     Final   Value: 50,000 COLONIES/mL LACTOBACILLUS SPECIES     Note: Standardized susceptibility testing for this organism is not available.     Performed at [facility]   Report Status 11/21/2012 FINAL   Final  MRSA PCR SCREENING     Status: Abnormal   Collection Time    11/20/12  5:52 PM      Result Value Range Status   MRSA by PCR POSITIVE (*) NEGATIVE Final   Comment:            The GeneXpert MRSA Assay (FDA     approved for NASAL specimens     only), is one component of a     comprehensive MRSA colonization     surveillance program. It is not     intended to diagnose MRSA     infection nor to guide or     monitor treatment for     MRSA infections.      RESULT CALLED TO, READ BACK BY AND VERIFIED WITH:     GRACOU,R RN 1949 11/20/12 MITCHELL,L  CLOSTRIDIUM DIFFICILE BY PCR     Status: Abnormal   Collection Time    11/20/12  8:27 PM      Result Value Range Status   C difficile by pcr POSITIVE (*) NEGATIVE Final   Comment: CRITICAL RESULT CALLED TO, READ BACK BY AND VERIFIED WITH:     PETTIFORD A RN 11/21/12 0805 COSTELLO B  CULTURE, BLOOD (ROUTINE X 2)     Status: None   Collection Time    11/22/12 12:20 PM      Result Value Range Status   Specimen Description BLOOD RIGHT HAND   Final   Special Requests BOTTLES DRAWN AEROBIC AND ANAEROBIC 10CC   Final   Culture  Setup Time     Final   Value: 11/22/2012 18:09     Performed at [facility]   Culture     Final   Value: NO GROWTH  5 DAYS     Performed at [facility]   Report Status 11/28/2012 FINAL   Final  CULTURE, BLOOD (ROUTINE X 2)     Status: None   Collection Time    11/22/12 12:32 PM      Result Value Range Status   Specimen Description BLOOD RIGHT HAND   Final   Special Requests BOTTLES DRAWN AEROBIC AND ANAEROBIC 10CC   Final   Culture  Setup Time     Final   Value: 11/22/2012 18:10     Performed at [facility]   Culture     Final   Value: NO GROWTH 5 DAYS     Performed at [facility]   Report Status 11/28/2012 FINAL   Final    Studies/Results: Dg Abd Portable 2v  11/26/2012   CLINICAL DATA:  Ileus  EXAM: PORTABLE ABDOMEN - 2 VIEW  COMPARISON:  06/10/2010  FINDINGS: Nonobstructive bowel gas pattern. No evidence of ileus. No organomegaly or suspicious calcification. Vascular calcifications without visible aneurysm. No free air organomegaly. Scoliosis and degenerative changes in the lumbar spine.  IMPRESSION: No evidence of obstruction or ileus.   Electronically Signed   By: Charlett Nose M.D.   On: 11/26/2012 20:38      Assessment/Plan: [name] is a 79 y.o. female with 79 year old lady with history of diabetes mellitus coronary  artery disease valvular heart disease status post coronary artery bypass grafting aortic valve replacement in 2009 and stage renal disease on hemodialysis via AV fistula now with staph aureus bacteremia with concern for infection of her aortic valve or one of her native valves. She has had recent surgery to remove basal cell carcinoma.    #1 Methicillin Sensitive Staphylococcus Aureus Bacteremia, rule out endocarditis: 2 d echo not showing vegetations, Dr. Edilia Bo feels AVF not infected overtly  --i changed to high dose nafcillin yesterday due to my concerns taht she might have PVE with emboli to CNS  -she is to have TEE today  --if she has endocarditis she will need 6 weeks of beta lactam antibiotics post clearance of her blood cultures and  6 weeks of rifampin  She will also need two weeks total of gentamicin    #2 C difficile colitis: -continue vancomycin at 500mg  qid  Thru duration of her other antibiotics + additional 2 weeks  --she will be at significant risk of relapse while on abx for bacteremia, possible AVF and possible prosthetic and or native valve infection      LOS: 8 days   Acey Lav 11/28/2012, 11:28 AM

## 2012-11-28 NOTE — Progress Notes (Signed)
INITIAL NUTRITION ASSESSMENT  DOCUMENTATION CODES Per approved criteria  -Not Applicable   INTERVENTION:  Continue Nepro Carb Steady TID (425 kcals, 19.1 gm protein per 8 fl oz bottle)  Continue RENA-VIT daily RD to follow for nutrition care plan  NUTRITION DIAGNOSIS: Increased nutrient needs related to HD, sepsis as evidenced by estimated nutrition needs  Goal: Pt to meet >/= 90% of their estimated nutrition needs   Monitor:  PO & supplemental intake, weight, labs, I/O's  Reason for Assessment: Malnutrition Screening Tool Report  79 y.o. female  Admitting Dx: Sepsis  ASSESSMENT: Patient with PMH of ESRD, diabetes mellitus, CAD and C. Difficile; admitted with nausea, vomiting and significant amounts of diarrhea; in ED urine was positive for UTI; started on IV Rocephin and Flagyl.  Patient reports her appetite is OK; usually eats 3 meals per day (one after her HD session); patient's weight readings have fluctuated, more than likely due to fluid; patient likes to drink Nepro supplements -- orders in place.  Height: Ht Readings from Last 1 Encounters:  11/20/12 5\' 2"  (1.575 m)    Weight: Wt Readings from Last 1 Encounters:  11/28/12 120 lb 13 oz (54.8 kg)    Ideal Body Weight: 110 lb  % Ideal Body Weight: 109%  Wt Readings from Last 10 Encounters:  11/28/12 120 lb 13 oz (54.8 kg)  11/28/12 120 lb 13 oz (54.8 kg)  11/18/12 113 lb (51.256 kg)  11/15/12 113 lb 4 oz (51.37 kg)  06/07/12 113 lb (51.256 kg)  12/08/11 108 lb (48.988 kg)  11/12/11 108 lb (48.988 kg)  08/04/11 102 lb 8 oz (46.494 kg)  06/18/11 104 lb (47.174 kg)  06/18/11 104 lb (47.174 kg)    Usual Body Weight: 113 lb  % Usual Body Weight: 106%  BMI:  Body mass index is 22.09 kg/(m^2).  Estimated Nutritional Needs: Kcal: 1400-1600 Protein: 65-75 gm Fluid: 1200 ml  Skin: Intact  Diet Order: Renal 60/70-02-06-1198 ml  EDUCATION NEEDS: -No education needs identified at this  time   Intake/Output Summary (Last 24 hours) at 11/28/12 1325 Last data filed at 11/28/12 1200  Gross per 24 hour  Intake 643.33 ml  Output     30 ml  Net 613.33 ml    Labs:   Recent Labs Lab 11/25/12 0808 11/26/12 0547 11/27/12 0355 11/28/12 0445  NA 132* 134* 132* 132*  K 3.9 3.7 3.6 3.7  CL 93* 96 95* 93*  CO2 22 24 25 25   BUN 32* 19 30* 18  CREATININE 3.81* 2.88* 3.84* 2.49*  CALCIUM 9.3 8.8 8.5 8.2*  PHOS 2.8  --  3.1 3.4  GLUCOSE 157* 147* 165* 199*    CBG (last 3)   Recent Labs  11/27/12 2132 11/28/12 0807 11/28/12 1220  GLUCAP 226* 180* 107*    Scheduled Meds: . amLODipine  5 mg Oral QPM  . aspirin EC  81 mg Oral Daily  . calcium carbonate  1 tablet Oral TID WC  . darbepoetin  25 mcg Intravenous Q Mon-HD  . doxercalciferol  4 mcg Intravenous Q M,W,F-HD  . famotidine (PEPCID) IV  20 mg Intravenous Q24H  . feeding supplement (NEPRO CARB STEADY)  237 mL Oral TID BM  . folic acid  0.5 mg Oral Daily  . [START ON 11/30/2012] gentamicin  60 mg Intravenous Q M,W,F-HD  . insulin aspart  0-9 Units Subcutaneous TID WC  . levothyroxine  75 mcg Oral QAC breakfast  . metoprolol  75 mg Oral BID  .  multivitamin  1 tablet Oral QHS  . nafcillin IV  2 g Intravenous Q4H  . rifampin (RIFADIN) IVPB  300 mg Intravenous Q8H  . sodium chloride  3 mL Intravenous Q12H  . vancomycin  500 mg Oral Q6H  . vitamin C  500 mg Oral Daily    Continuous Infusions: . sodium chloride 10 mL/hr at 11/26/12 2000  . sodium chloride 20 mL/hr at 11/28/12 1120    Past Medical History  Diagnosis Date  . Anemia     NOS  . Personal history of colonic polyps   . GERD (gastroesophageal reflux disease)   . Hyperlipidemia     takes Simvastatin nightly  . Osteoarthritis   . Osteopenia   . Urinary incontinence   . Pulmonary hypertension   . Anxiety   . Renal insufficiency   . Coronary artery disease   . Aortic stenosis   . Hyperlipidemia   . Hypertension     takes Metoprolol  daily  . Peripheral vascular disease   . CHF (congestive heart failure)   . Migraine     hx of  . Constipation   . Diarrhea   . Oligouria   . History of blood transfusion   . Diabetes mellitus     type II;was on Glipizide but has been off x 6mon  . Hypothyroidism     takes Synthroid daily  . Cancer     Mole on top of head to be removed in July 2013    Past Surgical History  Procedure Laterality Date  . Abdominal hysterectomy    . Tonsillectomy    . Cardiac catheterization  2000    cad  . Replacement total knee bilateral  05/1998  . Cataract extraction    . Coronary artery bypass graft      od  . Adenosine myoview  2007    benign, EF 69%  . Carotid endarterectomy  2011    Right  . Coronary artery bypass graft  2009  . Aortic valve replacement  2009  . Amputation-left great toe  7/12    Dr August Saucer  . Traumatic amputation of right dip joint of index finger    . Insertion of dialysis catheter  12/18/2010    Procedure: INSERTION OF DIALYSIS CATHETER;  Surgeon: Pryor Ochoa, MD;  Location: [facility] OR;  Service: Vascular;  Laterality: Right;  . Av fistula placement  12/22/2010    Procedure: ARTERIOVENOUS (AV) FISTULA CREATION;  Surgeon: Nilda Simmer, MD;  Location: [facility] OR;  Service: Vascular;  Laterality: Left;  Creation of Left Brachial-Cephalic Fistula  . Appendectomy    . Eye surgery      BIlateral  . Amputation  06/18/2011    Procedure: AMPUTATION DIGIT;  Surgeon: Nadara Mustard, MD;  Location: [facility] OR;  Service: Orthopedics;  Laterality: Left;  Left 2nd Toe Amputation at MTP Joint    Maureen Chatters, RD, LDN Pager #: [phone] After-Hours Pager #: [phone]

## 2012-11-28 NOTE — Progress Notes (Signed)
ANTIBIOTIC CONSULT NOTE - FOLLOW UP  Pharmacy Consult for gentamicin Indication: MSSA bacteremia, r/o endocarditis  Allergies  Allergen Reactions  . Captopril     REACTION: unspecified  . Enalapril Maleate     REACTION: cough  . Nitrofurantoin     REACTION: itching  . Ramipril     REACTION: unspecified  . Sulfa Antibiotics Other (See Comments)    Reaction unknown  . Verapamil     REACTION: unspecified    Patient Measurements: Height: 5\' 2"  (157.5 cm) Weight: 120 lb 13 oz (54.8 kg) IBW/kg (Calculated) : 50.1 Adjusted Body Weight:   Vital Signs: Temp: 98.5 F (36.9 C) (11/24 0808) Temp src: Oral (11/24 0808) BP: 132/40 mmHg (11/24 0808) Pulse Rate: 82 (11/24 0808) Intake/Output from previous day: 11/23 0701 - 11/24 0700 In: 1390 [NG/GT:240; IV Piggyback:1150] Out: -1726 [Urine:30] Intake/Output from this shift: Total I/O In: 50 [IV Piggyback:50] Out: -   Labs:  Recent Labs  11/26/12 0547 11/26/12 0930 11/27/12 0355 11/28/12 0445  WBC 20.1* 22.0* 22.9* 21.6*  HGB 10.3* 9.9* 10.1* 10.2*  PLT 176 182 188 196  CREATININE 2.88*  --  3.84* 2.49*   Estimated Creatinine Clearance: 11.9 ml/min (by C-G formula based on Cr of 2.49). No results found for this basename: VANCOTROUGH, Leodis Binet, VANCORANDOM, GENTTROUGH, GENTPEAK, GENTRANDOM, TOBRATROUGH, TOBRAPEAK, TOBRARND, AMIKACINPEAK, AMIKACINTROU, AMIKACIN,  in the last 72 hours   Microbiology: Recent Results (from the past 720 hour(s))  CULTURE, BLOOD (ROUTINE X 2)     Status: None   Collection Time    11/20/12  1:39 PM      Result Value Range Status   Specimen Description BLOOD RIGHT ARM   Final   Special Requests BOTTLES DRAWN AEROBIC AND ANAEROBIC 4CC   Final   Culture  Setup Time     Final   Value: 11/20/2012 17:58     Performed at [facility]   Culture     Final   Value: STAPHYLOCOCCUS AUREUS     Note: RIFAMPIN AND GENTAMICIN SHOULD NOT BE USED AS SINGLE DRUGS FOR TREATMENT OF STAPH  INFECTIONS.     Note: Gram Stain Report Called to,Read Back By and Verified With: Bonnee Quin RN on 11/21/12 at 03:10 by Christie Nottingham     Performed at [facility]   Report Status 11/23/2012 FINAL   Final   Organism ID, Bacteria STAPHYLOCOCCUS AUREUS   Final  CULTURE, BLOOD (ROUTINE X 2)     Status: None   Collection Time    11/20/12  2:19 PM      Result Value Range Status   Specimen Description BLOOD RIGHT HAND   Final   Special Requests BOTTLES DRAWN AEROBIC AND ANAEROBIC 10CC   Final   Culture  Setup Time     Final   Value: 11/20/2012 17:58     Performed at [facility]   Culture     Final   Value: STAPHYLOCOCCUS AUREUS     Note: SUSCEPTIBILITIES PERFORMED ON PREVIOUS CULTURE WITHIN THE LAST 5 DAYS.     Note: Gram Stain Report Called to,Read Back By and Verified With: Bonnee Quin RN on 11/21/12 at 03:10 by Christie Nottingham     Performed at [facility]   Report Status 11/23/2012 FINAL   Final  URINE CULTURE     Status: None   Collection Time    11/20/12  3:05 PM      Result Value Range Status   Specimen Description URINE,  CATHETERIZED   Final   Special Requests NONE   Final   Culture  Setup Time     Final   Value: 11/20/2012 20:00     Performed at [facility]   Culture     Final   Value: 50,000 COLONIES/mL LACTOBACILLUS SPECIES     Note: Standardized susceptibility testing for this organism is not available.     Performed at [facility]   Report Status 11/21/2012 FINAL   Final  MRSA PCR SCREENING     Status: Abnormal   Collection Time    11/20/12  5:52 PM      Result Value Range Status   MRSA by PCR POSITIVE (*) NEGATIVE Final   Comment:            The GeneXpert MRSA Assay (FDA     approved for NASAL specimens     only), is one component of a     comprehensive MRSA colonization     surveillance program. It is not     intended to diagnose MRSA     infection nor to guide or     monitor treatment for     MRSA infections.     RESULT  CALLED TO, READ BACK BY AND VERIFIED WITH:     GRACOU,R RN 1949 11/20/12 MITCHELL,L  CLOSTRIDIUM DIFFICILE BY PCR     Status: Abnormal   Collection Time    11/20/12  8:27 PM      Result Value Range Status   C difficile by pcr POSITIVE (*) NEGATIVE Final   Comment: CRITICAL RESULT CALLED TO, READ BACK BY AND VERIFIED WITH:     PETTIFORD A RN 11/21/12 0805 COSTELLO B  CULTURE, BLOOD (ROUTINE X 2)     Status: None   Collection Time    11/22/12 12:20 PM      Result Value Range Status   Specimen Description BLOOD RIGHT HAND   Final   Special Requests BOTTLES DRAWN AEROBIC AND ANAEROBIC 10CC   Final   Culture  Setup Time     Final   Value: 11/22/2012 18:09     Performed at [facility]   Culture     Final   Value: NO GROWTH 5 DAYS     Performed at [facility]   Report Status 11/28/2012 FINAL   Final  CULTURE, BLOOD (ROUTINE X 2)     Status: None   Collection Time    11/22/12 12:32 PM      Result Value Range Status   Specimen Description BLOOD RIGHT HAND   Final   Special Requests BOTTLES DRAWN AEROBIC AND ANAEROBIC 10CC   Final   Culture  Setup Time     Final   Value: 11/22/2012 18:10     Performed at [facility]   Culture     Final   Value: NO GROWTH 5 DAYS     Performed at [facility]   Report Status 11/28/2012 FINAL   Final    Anti-infectives   Start     Dose/Rate Route Frequency Ordered Stop   11/30/12 1600  gentamicin (GARAMYCIN) IVPB 60 mg     60 mg 100 mL/hr over 30 Minutes Intravenous Every M-W-F (Hemodialysis) 11/28/12 0950     11/29/12 1600  gentamicin (GARAMYCIN) IVPB 60 mg     60 mg 100 mL/hr over 30 Minutes Intravenous Every Hemodialysis 11/28/12 0952     11/26/12 1800  nafcillin 2 g in  dextrose 5 % 50 mL IVPB     2 g 100 mL/hr over 30 Minutes Intravenous 6 times per day 11/26/12 1710     11/23/12 2000  ceFAZolin (ANCEF) IVPB 1 g/50 mL premix  Status:  Discontinued     1 g 100 mL/hr over 30 Minutes Intravenous Every M-W-F  (2000) 11/22/12 1138 11/22/12 1139   11/23/12 2000  ceFAZolin (ANCEF) IVPB 2 g/50 mL premix  Status:  Discontinued     2 g 100 mL/hr over 30 Minutes Intravenous Every M-W-F (2000) 11/22/12 1139 11/26/12 1710   11/23/12 1600  gentamicin (GARAMYCIN) IVPB 60 mg  Status:  Discontinued     60 mg 100 mL/hr over 30 Minutes Intravenous Every M-W-F (Hemodialysis) 11/22/12 1526 11/28/12 0950   11/22/12 1530  gentamicin (GARAMYCIN) IVPB 80 mg     80 mg 100 mL/hr over 30 Minutes Intravenous NOW 11/22/12 1524 11/22/12 1655   11/22/12 1500  rifampin (RIFADIN) 300 mg in sodium chloride 0.9 % 100 mL IVPB     300 mg 200 mL/hr over 30 Minutes Intravenous 3 times per day 11/22/12 1359     11/22/12 1200  vancomycin (VANCOCIN) IVPB 1000 mg/200 mL premix     1,000 mg 200 mL/hr over 60 Minutes Intravenous STAT 11/22/12 1114 11/22/12 1347   11/22/12 1200  ceFAZolin (ANCEF) IVPB 2 g/50 mL premix     2 g 100 mL/hr over 30 Minutes Intravenous STAT 11/22/12 1138 11/22/12 1316   11/21/12 1200  vancomycin (VANCOCIN) 500 mg in sodium chloride 0.9 % 100 mL IVPB  Status:  Discontinued     500 mg 100 mL/hr over 60 Minutes Intravenous Every M-W-F (Hemodialysis) 11/21/12 0928 11/23/12 0909   11/21/12 0930  vancomycin (VANCOCIN) 1,250 mg in sodium chloride 0.9 % 250 mL IVPB     1,250 mg 166.7 mL/hr over 90 Minutes Intravenous STAT 11/21/12 0927 11/22/12 0930   11/20/12 1800  cefTRIAXone (ROCEPHIN) 1 g in dextrose 5 % 50 mL IVPB  Status:  Discontinued     1 g 100 mL/hr over 30 Minutes Intravenous Every 24 hours 11/20/12 1756 11/22/12 1120   11/20/12 1800  vancomycin (VANCOCIN) 50 mg/mL oral solution 500 mg     500 mg Oral 4 times per day 11/20/12 1756 12/04/12 1759   11/20/12 1756  metroNIDAZOLE (FLAGYL) IVPB 500 mg  Status:  Discontinued     500 mg 100 mL/hr over 60 Minutes Intravenous Every 8 hours 11/20/12 1756 11/22/12 1400   11/20/12 1545  metroNIDAZOLE (FLAGYL) IVPB 500 mg     500 mg 100 mL/hr over 60 Minutes  Intravenous  Once 11/20/12 1535 11/20/12 1725   11/20/12 1545  cefTRIAXone (ROCEPHIN) 1 g in dextrose 5 % 50 mL IVPB     1 g 100 mL/hr over 30 Minutes Intravenous  Once 11/20/12 1538 11/20/12 1724      Assessment: 89 yof continues on Gent/rifampin D#7 + Nafcillin D#3 for MSSA bacteremia & r/o bioprosthetic endocarditis + vanc PO D#8 for Cdiff. Planning 6 weeks of treatment and 2 weeks of gentamicin synergy. Of note pt is ESRD on HD MWF but schedule is off d/t holiday. Pt received HD yesterday and will get another session tomorrow. TEE planned for tomorrow   Vanc 11/18 >> 11/19 CTX 11/17 >> 11/18 Vanc po 11/17 >> Flagyl IV 11/17 >>11/20 Gent 11/18>> Cefazolin 11/19>>11/21 Rifampin 11/18 >>  Nafcillin 11/22>>  11/16 UCx: 50k lactobacillus (likely colonization) 11/16 BCx: MSSA 11/18 BCx: NEG 11/16 MRA -  POS 11/16 Cdiff - POS  Goal of Therapy:  Pre-HD Gent level </= 4  Plan:  1. Continue gentamicin 60mg  post-HD (will adjust for holiday schedule) 2. F/u renal plans, C&S, clinical status and ID plans 3. F/u TEE results 4. Check a pre-HD gent level tomorrow  Madicyn Mesina, Drake Leach 11/28/2012,9:55 AM

## 2012-11-28 NOTE — Progress Notes (Signed)
Call to Son to make aware of pending procedure of TEE scheduled for 3:30. Unable to reach him left a voice mail just to call back and ask for his mother's nurse.

## 2012-11-29 ENCOUNTER — Encounter (HOSPITAL_COMMUNITY)
Admission: EM | Disposition: A | Discharge status: Skilled Nursing Facility | Payer: Self-pay | Source: Home / Self Care | Attending: Internal Medicine

## 2012-11-29 DIAGNOSIS — B3789 Other sites of candidiasis: Secondary | ICD-10-CM

## 2012-11-29 DIAGNOSIS — B9689 Other specified bacterial agents as the cause of diseases classified elsewhere: Secondary | ICD-10-CM

## 2012-11-29 LAB — GLUCOSE, CAPILLARY
Glucose-Capillary: 74 mg/dL (ref 70–99)
Glucose-Capillary: 151 mg/dL — ABNORMAL HIGH (ref 70–99)

## 2012-11-29 LAB — CBC
HCT: 30.6 % — ABNORMAL LOW (ref 36.0–46.0)
Hemoglobin: 10.1 g/dL — ABNORMAL LOW (ref 12.0–15.0)
MCH: 34 pg (ref 26.0–34.0)
Platelets: 206 10*3/uL (ref 150–400)
RBC: 2.97 MIL/uL — ABNORMAL LOW (ref 3.87–5.11)
WBC: 18.6 10*3/uL — ABNORMAL HIGH (ref 4.0–10.5)

## 2012-11-29 LAB — URINE CULTURE: Colony Count: >=100000

## 2012-11-29 LAB — FOLATE: Folate: >20 ng/mL

## 2012-11-29 SURGERY — ECHOCARDIOGRAM, TRANSESOPHAGEAL
Anesthesia: Moderate Sedation

## 2012-11-29 MED ORDER — CEFAZOLIN SODIUM-DEXTROSE 2-3 GM-% IV SOLR
2.0000 g | Freq: Once | INTRAVENOUS | Status: AC
  Administered 2012-11-29: 2 g via INTRAVENOUS
  Filled 2012-11-29: qty 50

## 2012-11-29 MED ORDER — RIFAMPIN 300 MG PO CAPS
300.0000 mg | ORAL_CAPSULE | Freq: Three times a day (TID) | ORAL | Status: DC
  Administered 2012-11-29 – 2012-12-06 (×19): 300 mg via ORAL
  Filled 2012-11-29 (×23): qty 1

## 2012-11-29 MED ORDER — FLUCONAZOLE 100 MG PO TABS
100.0000 mg | ORAL_TABLET | Freq: Every day | ORAL | Status: DC
  Administered 2012-11-29 – 2012-12-06 (×8): 100 mg via ORAL
  Filled 2012-11-29 (×8): qty 1

## 2012-11-29 MED ORDER — FAMOTIDINE 20 MG PO TABS
20.0000 mg | ORAL_TABLET | Freq: Every day | ORAL | Status: DC
  Administered 2012-11-30 – 2012-12-05 (×6): 20 mg via ORAL
  Filled 2012-11-29 (×6): qty 1

## 2012-11-29 MED ORDER — FLUCONAZOLE 100 MG PO TABS: 100.0000 mg | ORAL_TABLET | Freq: Every day | ORAL | Status: DC

## 2012-11-29 MED ORDER — CEFAZOLIN SODIUM-DEXTROSE 2-3 GM-% IV SOLR
2.0000 g | INTRAVENOUS | Status: DC
  Administered 2012-11-30 – 2012-12-05 (×4): 2 g via INTRAVENOUS
  Filled 2012-11-29 (×5): qty 50

## 2012-11-29 NOTE — Progress Notes (Addendum)
[facility]   Day #4 nafcillin Day #8 rifampin Day # 8 gentamicin Day #8 oral vancomcyin   Subjective: Feels comfortable  Antibiotics:  Anti-infectives   Start     Dose/Rate Route Frequency Ordered Stop   11/30/12 1600  gentamicin (GARAMYCIN) IVPB 60 mg     60 mg 100 mL/hr over 30 Minutes Intravenous Every M-W-F (Hemodialysis) 11/28/12 0950     11/29/12 1600  gentamicin (GARAMYCIN) IVPB 60 mg     60 mg 100 mL/hr over 30 Minutes Intravenous Every Hemodialysis 11/28/12 0952     11/26/12 1800  nafcillin 2 g in dextrose 5 % 50 mL IVPB     2 g 100 mL/hr over 30 Minutes Intravenous 6 times per day 11/26/12 1710     11/23/12 2000  ceFAZolin (ANCEF) IVPB 1 g/50 mL premix  Status:  Discontinued     1 g 100 mL/hr over 30 Minutes Intravenous Every M-W-F (2000) 11/22/12 1138 11/22/12 1139   11/23/12 2000  ceFAZolin (ANCEF) IVPB 2 g/50 mL premix  Status:  Discontinued     2 g 100 mL/hr over 30 Minutes Intravenous Every M-W-F (2000) 11/22/12 1139 11/26/12 1710   11/23/12 1600  gentamicin (GARAMYCIN) IVPB 60 mg  Status:  Discontinued     60 mg 100 mL/hr over 30 Minutes Intravenous Every M-W-F (Hemodialysis) 11/22/12 1526 11/28/12 0950   11/22/12 1530  gentamicin (GARAMYCIN) IVPB 80 mg     80 mg 100 mL/hr over 30 Minutes Intravenous NOW 11/22/12 1524 11/22/12 1655   11/22/12 1500  rifampin (RIFADIN) 300 mg in sodium chloride 0.9 % 100 mL IVPB     300 mg 200 mL/hr over 30 Minutes Intravenous 3 times per day 11/22/12 1359     11/22/12 1200  vancomycin (VANCOCIN) IVPB 1000 mg/200 mL premix     1,000 mg 200 mL/hr over 60 Minutes Intravenous STAT 11/22/12 1114 11/22/12 1347   11/22/12 1200  ceFAZolin (ANCEF) IVPB 2 g/50 mL premix     2 g 100 mL/hr over 30 Minutes Intravenous STAT 11/22/12 1138 11/22/12 1316   11/21/12 1200  vancomycin (VANCOCIN) 500 mg in sodium chloride 0.9 % 100 mL IVPB  Status:  Discontinued     500 mg 100 mL/hr over 60 Minutes Intravenous  Every M-W-F (Hemodialysis) 11/21/12 0928 11/23/12 0909   11/21/12 0930  vancomycin (VANCOCIN) 1,250 mg in sodium chloride 0.9 % 250 mL IVPB     1,250 mg 166.7 mL/hr over 90 Minutes Intravenous STAT 11/21/12 0927 11/22/12 0930   11/20/12 1800  cefTRIAXone (ROCEPHIN) 1 g in dextrose 5 % 50 mL IVPB  Status:  Discontinued     1 g 100 mL/hr over 30 Minutes Intravenous Every 24 hours 11/20/12 1756 11/22/12 1120   11/20/12 1800  vancomycin (VANCOCIN) 50 mg/mL oral solution 500 mg     500 mg Oral 4 times per day 11/20/12 1756 12/04/12 1759   11/20/12 1756  metroNIDAZOLE (FLAGYL) IVPB 500 mg  Status:  Discontinued     500 mg 100 mL/hr over 60 Minutes Intravenous Every 8 hours 11/20/12 1756 11/22/12 1400   11/20/12 1545  metroNIDAZOLE (FLAGYL) IVPB 500 mg     500 mg 100 mL/hr over 60 Minutes Intravenous  Once 11/20/12 1535 11/20/12 1725   11/20/12 1545  cefTRIAXone (ROCEPHIN) 1 g in dextrose 5 % 50 mL IVPB     1 g 100 mL/hr over 30 Minutes Intravenous  Once 11/20/12 1538 11/20/12 1724  Medications: Scheduled Meds: . amLODipine  5 mg Oral QPM  . aspirin EC  81 mg Oral Daily  . calcium carbonate  1 tablet Oral TID WC  . darbepoetin  25 mcg Intravenous Q Mon-HD  . doxercalciferol  4 mcg Intravenous Q M,W,F-HD  . famotidine (PEPCID) IV  20 mg Intravenous Q24H  . feeding supplement (NEPRO CARB STEADY)  237 mL Oral TID BM  . folic acid  0.5 mg Oral Daily  . [START ON 11/30/2012] gentamicin  60 mg Intravenous Q M,W,F-HD  . insulin aspart  0-9 Units Subcutaneous TID WC  . levothyroxine  75 mcg Oral QAC breakfast  . metoprolol  75 mg Oral BID  . multivitamin  1 tablet Oral QHS  . nafcillin IV  2 g Intravenous Q4H  . rifampin (RIFADIN) IVPB  300 mg Intravenous Q8H  . sodium chloride  3 mL Intravenous Q12H  . vancomycin  500 mg Oral Q6H  . vitamin C  500 mg Oral Daily   Continuous Infusions: . sodium chloride 500 mL (11/28/12 1453)  . sodium chloride 20 mL/hr at 11/28/12 1120   PRN  Meds:.sodium chloride, sodium chloride, acetaminophen, acetaminophen, feeding supplement (NEPRO CARB STEADY), gentamicin, heparin, heparin, hydrALAZINE, HYDROcodone-acetaminophen, lidocaine (PF), lidocaine-prilocaine, ondansetron (ZOFRAN) IV, pentafluoroprop-tetrafluoroeth   Objective: Weight change: 10.6 oz (0.3 kg)  Intake/Output Summary (Last 24 hours) at 11/29/12 1213 Last data filed at 11/29/12 0650  Gross per 24 hour  Intake   1567 ml  Output    600 ml  Net    967 ml   Blood pressure 149/44, pulse 82, temperature 98.4 F (36.9 C), temperature source Oral, resp. rate 20, height 5\' 2"  (1.575 m), weight 120 lb 13 oz (54.8 kg), SpO2 98.00%. Temp:  [97.7 F (36.5 C)-99.3 F (37.4 C)] 98.4 F (36.9 C) (11/25 0923) Pulse Rate:  [80-88] 82 (11/25 1000) Resp:  [17-34] 20 (11/25 1130) BP: (134-151)/(36-54) 149/44 mmHg (11/25 1130) SpO2:  [94 %-99 %] 98 % (11/25 0938) Weight:  [120 lb 13 oz (54.8 kg)] 120 lb 13 oz (54.8 kg) (11/25 0400)  Physical Exam: General: Alert and awake, oriented person, place, month comfortable Getting HD   Neuro: nonfocal,    Lab Results:  Recent Labs  11/27/12 0355 11/28/12 0445  WBC 22.9* 21.6*  HGB 10.1* 10.2*  HCT 31.1* 31.4*  PLT 188 196    BMET  Recent Labs  11/27/12 0355 11/28/12 0445  NA 132* 132*  K 3.6 3.7  CL 95* 93*  CO2 25 25  GLUCOSE 165* 199*  BUN 30* 18  CREATININE 3.84* 2.49*  CALCIUM 8.5 8.2*    Micro Results: Recent Results (from the past 240 hour(s))  CULTURE, BLOOD (ROUTINE X 2)     Status: None   Collection Time    11/20/12  1:39 PM      Result Value Range Status   Specimen Description BLOOD RIGHT ARM   Final   Special Requests BOTTLES DRAWN AEROBIC AND ANAEROBIC 4CC   Final   Culture  Setup Time     Final   Value: 11/20/2012 17:58     Performed at [facility]   Culture     Final   Value: STAPHYLOCOCCUS AUREUS     Note: RIFAMPIN AND GENTAMICIN SHOULD NOT BE USED AS SINGLE DRUGS FOR TREATMENT  OF STAPH INFECTIONS.     Note: Gram Stain Report Called to,Read Back By and Verified With: Bonnee Quin RN on 11/21/12 at 03:10 by Christie Nottingham  Performed at [facility]   Report Status 11/23/2012 FINAL   Final   Organism ID, Bacteria STAPHYLOCOCCUS AUREUS   Final  CULTURE, BLOOD (ROUTINE X 2)     Status: None   Collection Time    11/20/12  2:19 PM      Result Value Range Status   Specimen Description BLOOD RIGHT HAND   Final   Special Requests BOTTLES DRAWN AEROBIC AND ANAEROBIC 10CC   Final   Culture  Setup Time     Final   Value: 11/20/2012 17:58     Performed at [facility]   Culture     Final   Value: STAPHYLOCOCCUS AUREUS     Note: SUSCEPTIBILITIES PERFORMED ON PREVIOUS CULTURE WITHIN THE LAST 5 DAYS.     Note: Gram Stain Report Called to,Read Back By and Verified With: Bonnee Quin RN on 11/21/12 at 03:10 by Christie Nottingham     Performed at [facility]   Report Status 11/23/2012 FINAL   Final  URINE CULTURE     Status: None   Collection Time    11/20/12  3:05 PM      Result Value Range Status   Specimen Description URINE, CATHETERIZED   Final   Special Requests NONE   Final   Culture  Setup Time     Final   Value: 11/20/2012 20:00     Performed at [facility]   Culture     Final   Value: 50,000 COLONIES/mL LACTOBACILLUS SPECIES     Note: Standardized susceptibility testing for this organism is not available.     Performed at [facility]   Report Status 11/21/2012 FINAL   Final  MRSA PCR SCREENING     Status: Abnormal   Collection Time    11/20/12  5:52 PM      Result Value Range Status   MRSA by PCR POSITIVE (*) NEGATIVE Final   Comment:            The GeneXpert MRSA Assay (FDA     approved for NASAL specimens     only), is one component of a     comprehensive MRSA colonization     surveillance program. It is not     intended to diagnose MRSA     infection nor to guide or     monitor treatment for     MRSA infections.      RESULT CALLED TO, READ BACK BY AND VERIFIED WITH:     GRACOU,R RN 1949 11/20/12 MITCHELL,L  CLOSTRIDIUM DIFFICILE BY PCR     Status: Abnormal   Collection Time    11/20/12  8:27 PM      Result Value Range Status   C difficile by pcr POSITIVE (*) NEGATIVE Final   Comment: CRITICAL RESULT CALLED TO, READ BACK BY AND VERIFIED WITH:     PETTIFORD A RN 11/21/12 0805 COSTELLO B  CULTURE, BLOOD (ROUTINE X 2)     Status: None   Collection Time    11/22/12 12:20 PM      Result Value Range Status   Specimen Description BLOOD RIGHT HAND   Final   Special Requests BOTTLES DRAWN AEROBIC AND ANAEROBIC 10CC   Final   Culture  Setup Time     Final   Value: 11/22/2012 18:09     Performed at [facility]   Culture     Final   Value: NO GROWTH 5 DAYS  Performed at [facility]   Report Status 11/28/2012 FINAL   Final  CULTURE, BLOOD (ROUTINE X 2)     Status: None   Collection Time    11/22/12 12:32 PM      Result Value Range Status   Specimen Description BLOOD RIGHT HAND   Final   Special Requests BOTTLES DRAWN AEROBIC AND ANAEROBIC 10CC   Final   Culture  Setup Time     Final   Value: 11/22/2012 18:10     Performed at [facility]   Culture     Final   Value: NO GROWTH 5 DAYS     Performed at [facility]   Report Status 11/28/2012 FINAL   Final  URINE CULTURE     Status: None   Collection Time    11/27/12  6:47 PM      Result Value Range Status   Specimen Description URINE, CATHETERIZED   Final   Special Requests NONE   Final   Culture  Setup Time     Final   Value: 11/28/2012 00:11     Performed at [facility] Count     Final   Value: >=100,000 COLONIES/ML     Performed at [facility]   Culture     Final   Value: YEAST     Performed at [facility]   Report Status 11/29/2012 FINAL   Final    Studies/Results: No results found.    Assessment/Plan: [name] is a 79 y.o. female with 79 year old lady with  history of diabetes mellitus coronary artery disease valvular heart disease status post coronary artery bypass grafting aortic valve replacement in 2009 and stage renal disease on hemodialysis via AV fistula now with staph aureus bacteremia with concern for infection of her aortic valve or one of her native valves. She has had recent surgery to remove basal cell carcinoma.    #1 Methicillin Sensitive Staphylococcus Aureus Bacteremia, rule out endocarditis: 2 d echo not showing vegetations, Dr. Edilia Bo feels AVF not infected overtly.  I have discussed this case carefully with Marca Ancona MD from [facility] and he has reviewed TTE films whic provided very good windows. He sees no evidence for ANY large vegetations that we would worry might cause embolization to CNS and he sees no vegetations at all. GIven her frail condition, we felt after thoughtful consideration that TEE not pursuing and that we are fairly confident based on Dr. Kathlyn Sacramento review of TTE that pt DOES NTO have large vegetation at risk for embolizing to CNS. THerefore we feel RISK outweighs benefit for TEE and will not pursue this  --I will change back to ANCEF which will be more convenient with HD She will need this for 6 weeks WITH RIFAMPIN 300mg  PO BID  --stop date is =  01/03/13  And she will need gentamicin for 2 weeks stop date = 12/06/12  I spent greater than 45 minutes with the patient including greater than 50% of time in face to face counsel of the patient and in coordination of their care.    #2 C difficile colitis: -continue vancomycin at 500mg  qid  Thru duration of her other antibiotics + additional 2 weeks = STOP DATE 01/17/12  --she will be at significant risk of relapse while on abx for bacteremia, possible AVF and possible prosthetic and or native valve infection   #3 Candida in urine: there was no foley, therefore, I will go  ahead and give her a 10 day course of fluconazole   #4 lactobacillus in urine=  NOT a pathogen   I will set her up for HSFU in my clinic in the next 3 weeks before Holidays.   Otherwise I will sign off at this time  Please call with further questions.    LOS: 9 days   Acey Lav 11/29/2012, 12:13 PM

## 2012-11-29 NOTE — Progress Notes (Addendum)
ANTIBIOTIC CONSULT NOTE - FOLLOW UP  Pharmacy Consult for gentamicin Indication: MSSA bacteremia, r/o endocarditis  Allergies  Allergen Reactions  . Captopril     REACTION: unspecified  . Enalapril Maleate     REACTION: cough  . Nitrofurantoin     REACTION: itching  . Ramipril     REACTION: unspecified  . Sulfa Antibiotics Other (See Comments)    Reaction unknown  . Verapamil     REACTION: unspecified    Patient Measurements: Height: 5\' 2"  (157.5 cm) Weight: 120 lb 13 oz (54.8 kg) IBW/kg (Calculated) : 50.1 Adjusted Body Weight:   Vital Signs: Temp: 98.5 F (36.9 C) (11/25 0749) Temp src: Oral (11/25 0749) BP: 138/50 mmHg (11/25 0749) Pulse Rate: 85 (11/25 0749) Intake/Output from previous day: 11/24 0701 - 11/25 0700 In: 1710.3 [P.O.:837; I.V.:173.3; IV Piggyback:700] Out: 600 [Stool:600] Intake/Output from this shift:    Labs:  Recent Labs  11/26/12 0930 11/27/12 0355 11/28/12 0445  WBC 22.0* 22.9* 21.6*  HGB 9.9* 10.1* 10.2*  PLT 182 188 196  CREATININE  --  3.84* 2.49*   Estimated Creatinine Clearance: 11.9 ml/min (by C-G formula based on Cr of 2.49).  Recent Labs  11/29/12 0500  GENTRANDOM 1.6     Microbiology: Recent Results (from the past 720 hour(s))  CULTURE, BLOOD (ROUTINE X 2)     Status: None   Collection Time    11/20/12  1:39 PM      Result Value Range Status   Specimen Description BLOOD RIGHT ARM   Final   Special Requests BOTTLES DRAWN AEROBIC AND ANAEROBIC 4CC   Final   Culture  Setup Time     Final   Value: 11/20/2012 17:58     Performed at [facility]   Culture     Final   Value: STAPHYLOCOCCUS AUREUS     Note: RIFAMPIN AND GENTAMICIN SHOULD NOT BE USED AS SINGLE DRUGS FOR TREATMENT OF STAPH INFECTIONS.     Note: Gram Stain Report Called to,Read Back By and Verified With: Bonnee Quin RN on 11/21/12 at 03:10 by Christie Nottingham     Performed at [facility]   Report Status 11/23/2012 FINAL   Final   Organism ID,  Bacteria STAPHYLOCOCCUS AUREUS   Final  CULTURE, BLOOD (ROUTINE X 2)     Status: None   Collection Time    11/20/12  2:19 PM      Result Value Range Status   Specimen Description BLOOD RIGHT HAND   Final   Special Requests BOTTLES DRAWN AEROBIC AND ANAEROBIC 10CC   Final   Culture  Setup Time     Final   Value: 11/20/2012 17:58     Performed at [facility]   Culture     Final   Value: STAPHYLOCOCCUS AUREUS     Note: SUSCEPTIBILITIES PERFORMED ON PREVIOUS CULTURE WITHIN THE LAST 5 DAYS.     Note: Gram Stain Report Called to,Read Back By and Verified With: Bonnee Quin RN on 11/21/12 at 03:10 by Christie Nottingham     Performed at [facility]   Report Status 11/23/2012 FINAL   Final  URINE CULTURE     Status: None   Collection Time    11/20/12  3:05 PM      Result Value Range Status   Specimen Description URINE, CATHETERIZED   Final   Special Requests NONE   Final   Culture  Setup Time     Final  Value: 11/20/2012 20:00     Performed at [facility]     Final   Value: 50,000 COLONIES/mL LACTOBACILLUS SPECIES     Note: Standardized susceptibility testing for this organism is not available.     Performed at [facility]   Report Status 11/21/2012 FINAL   Final  MRSA PCR SCREENING     Status: Abnormal   Collection Time    11/20/12  5:52 PM      Result Value Range Status   MRSA by PCR POSITIVE (*) NEGATIVE Final   Comment:            The GeneXpert MRSA Assay (FDA     approved for NASAL specimens     only), is one component of a     comprehensive MRSA colonization     surveillance program. It is not     intended to diagnose MRSA     infection nor to guide or     monitor treatment for     MRSA infections.     RESULT CALLED TO, READ BACK BY AND VERIFIED WITH:     GRACOU,R RN 1949 11/20/12 MITCHELL,L  CLOSTRIDIUM DIFFICILE BY PCR     Status: Abnormal   Collection Time    11/20/12  8:27 PM      Result Value Range Status   C difficile by pcr  POSITIVE (*) NEGATIVE Final   Comment: CRITICAL RESULT CALLED TO, READ BACK BY AND VERIFIED WITH:     PETTIFORD A RN 11/21/12 0805 COSTELLO B  CULTURE, BLOOD (ROUTINE X 2)     Status: None   Collection Time    11/22/12 12:20 PM      Result Value Range Status   Specimen Description BLOOD RIGHT HAND   Final   Special Requests BOTTLES DRAWN AEROBIC AND ANAEROBIC 10CC   Final   Culture  Setup Time     Final   Value: 11/22/2012 18:09     Performed at [facility]   Culture     Final   Value: NO GROWTH 5 DAYS     Performed at [facility]   Report Status 11/28/2012 FINAL   Final  CULTURE, BLOOD (ROUTINE X 2)     Status: None   Collection Time    11/22/12 12:32 PM      Result Value Range Status   Specimen Description BLOOD RIGHT HAND   Final   Special Requests BOTTLES DRAWN AEROBIC AND ANAEROBIC 10CC   Final   Culture  Setup Time     Final   Value: 11/22/2012 18:10     Performed at [facility]   Culture     Final   Value: NO GROWTH 5 DAYS     Performed at [facility]   Report Status 11/28/2012 FINAL   Final  URINE CULTURE     Status: None   Collection Time    11/27/12  6:47 PM      Result Value Range Status   Specimen Description URINE, CATHETERIZED   Final   Special Requests NONE   Final   Culture  Setup Time     Final   Value: 11/28/2012 00:11     Performed at [facility] Count     Final   Value: >=100,000 COLONIES/ML     Performed at [facility]   Culture     Final   Value: YEAST  Performed at [facility]   Report Status 11/29/2012 FINAL   Final    Anti-infectives   Start     Dose/Rate Route Frequency Ordered Stop   11/30/12 1600  gentamicin (GARAMYCIN) IVPB 60 mg     60 mg 100 mL/hr over 30 Minutes Intravenous Every M-W-F (Hemodialysis) 11/28/12 0950     11/29/12 1600  gentamicin (GARAMYCIN) IVPB 60 mg     60 mg 100 mL/hr over 30 Minutes Intravenous Every Hemodialysis 11/28/12 0952      11/26/12 1800  nafcillin 2 g in dextrose 5 % 50 mL IVPB     2 g 100 mL/hr over 30 Minutes Intravenous 6 times per day 11/26/12 1710     11/23/12 2000  ceFAZolin (ANCEF) IVPB 1 g/50 mL premix  Status:  Discontinued     1 g 100 mL/hr over 30 Minutes Intravenous Every M-W-F (2000) 11/22/12 1138 11/22/12 1139   11/23/12 2000  ceFAZolin (ANCEF) IVPB 2 g/50 mL premix  Status:  Discontinued     2 g 100 mL/hr over 30 Minutes Intravenous Every M-W-F (2000) 11/22/12 1139 11/26/12 1710   11/23/12 1600  gentamicin (GARAMYCIN) IVPB 60 mg  Status:  Discontinued     60 mg 100 mL/hr over 30 Minutes Intravenous Every M-W-F (Hemodialysis) 11/22/12 1526 11/28/12 0950   11/22/12 1530  gentamicin (GARAMYCIN) IVPB 80 mg     80 mg 100 mL/hr over 30 Minutes Intravenous NOW 11/22/12 1524 11/22/12 1655   11/22/12 1500  rifampin (RIFADIN) 300 mg in sodium chloride 0.9 % 100 mL IVPB     300 mg 200 mL/hr over 30 Minutes Intravenous 3 times per day 11/22/12 1359     11/22/12 1200  vancomycin (VANCOCIN) IVPB 1000 mg/200 mL premix     1,000 mg 200 mL/hr over 60 Minutes Intravenous STAT 11/22/12 1114 11/22/12 1347   11/22/12 1200  ceFAZolin (ANCEF) IVPB 2 g/50 mL premix     2 g 100 mL/hr over 30 Minutes Intravenous STAT 11/22/12 1138 11/22/12 1316   11/21/12 1200  vancomycin (VANCOCIN) 500 mg in sodium chloride 0.9 % 100 mL IVPB  Status:  Discontinued     500 mg 100 mL/hr over 60 Minutes Intravenous Every M-W-F (Hemodialysis) 11/21/12 0928 11/23/12 0909   11/21/12 0930  vancomycin (VANCOCIN) 1,250 mg in sodium chloride 0.9 % 250 mL IVPB     1,250 mg 166.7 mL/hr over 90 Minutes Intravenous STAT 11/21/12 0927 11/22/12 0930   11/20/12 1800  cefTRIAXone (ROCEPHIN) 1 g in dextrose 5 % 50 mL IVPB  Status:  Discontinued     1 g 100 mL/hr over 30 Minutes Intravenous Every 24 hours 11/20/12 1756 11/22/12 1120   11/20/12 1800  vancomycin (VANCOCIN) 50 mg/mL oral solution 500 mg     500 mg Oral 4 times per day 11/20/12 1756  12/04/12 1759   11/20/12 1756  metroNIDAZOLE (FLAGYL) IVPB 500 mg  Status:  Discontinued     500 mg 100 mL/hr over 60 Minutes Intravenous Every 8 hours 11/20/12 1756 11/22/12 1400   11/20/12 1545  metroNIDAZOLE (FLAGYL) IVPB 500 mg     500 mg 100 mL/hr over 60 Minutes Intravenous  Once 11/20/12 1535 11/20/12 1725   11/20/12 1545  cefTRIAXone (ROCEPHIN) 1 g in dextrose 5 % 50 mL IVPB     1 g 100 mL/hr over 30 Minutes Intravenous  Once 11/20/12 1538 11/20/12 1724      Assessment: 89 yof continues on Gent/rifampin D#8 + Nafcillin D#4  for MSSA bacteremia & r/o bioprosthetic endocarditis + vanc PO D#9 for Cdiff. Planning 6 weeks of treatment and 2 weeks of gentamicin synergy. Of note pt is ESRD on HD MWF but schedule is off d/t holiday. Pt received HD Sunday and will get another session today. TEE planned for today. Pre-HD gentamicin level checked today and was at goal at 1.6.  Vanc 11/18 >> 11/19 CTX 11/17 >> 11/18 Vanc po 11/17 >> Flagyl IV 11/17 >>11/20 Gent 11/18>> Cefazolin 11/19>>11/21 Rifampin 11/18 >>  Nafcillin 11/22>>  11/16 UCx: 50k lactobacillus (likely colonization) 11/16 BCx: MSSA 11/18 BCx: NEG 11/16 MRA - POS 11/16 Cdiff - POS  Goal of Therapy:  Pre-HD Gent level </= 4  Plan:  1. Continue gentamicin 60mg  post-HD (will adjust for holiday schedule) 2. F/u renal plans, C&S, clinical status and ID plans 3. F/u TEE results  Haja Crego, Drake Leach 11/29/2012,8:27 AM  Addendum: ID restarting cefazolin d/t ease of dosing with HD and DC nafcillin.  Plan: 1. Cefazolin 2mg  with each HD session 2. Continue other abx as ordered  Lysle Pearl, PharmD, BCPS Pager # [phone] 11/29/2012 1:16 PM

## 2012-11-29 NOTE — Procedures (Signed)
On hemodialysis via left arm AVF without hemodynamic issues.  Assessment/Plan:  1. MSSA bacteremia / hx prosthetic AVR: TTE negative for vegetation, on ancef/gent/rifampin per ID, AV fistula has no signs of infection; TEE tentatively planned for today per Cardiology 2. Cdif+ diarrhea: on po vanc, ID following 3. ESRD: MWF. HD today (holiday sched) then again on Tuesday.  4. Anemia of CKD: Hb 10.1, op tsat 40%, Cont esa w darbe 25 on wed 5. HTN/volume - up 6. CABG/AVR 2009  [name] C

## 2012-11-29 NOTE — Progress Notes (Addendum)
[facility] Progress Note [place] TEAM 1 - Stepdown/ICU TEAM   [name]:[id number] DOB: 04/08/1920 DOA: 11/20/2012 PCP: Tillman Abide, MD  Admit HPI / Brief Narrative: 79 y.o. female with past medical history of end-stage renal disease, diabetes mellitus, CAD and prior C. difficile who despite her advanced age and medical problems is rather functional and lives alone with her son nearby. Patient had been in her usual state of health when she complained to her son about chills 3 days prior to admit. She went to dialysis and had a low-grade temp. Dialysis was done partially on that day. Patient visited her PCP and a chest x-ray was unremarkable. She spent the weekend with her son, and was brought to the ED when she was noted to be progressively weaker with diarrhea.  In the emergency room the patient was noted to have a lactic acid level 11.9, anion gap of 24 and a glucose of 206. Her white count was 13.2-92% shift and her urine was positive for UTI. Stool studies were sent for C. difficile as was urine culture and patient was started on IV Rocephin and Flagyl.   Assessment/Plan:  Sepsis due to UTI, Staph bacteremia, and C diff - sepsis physiology is improved  MRSA bacteremia - 2/2 blood cx + Cont abx per ID - TTE w/o overt vegetations - ID and cardio have decided that there is not a dire need for TEE in this frail lady.  - plan for treatment is Ancef with dialysis for 6 wks and Rifampin to stop on 01/03/13 - Gent to be given through 12/06/12  C diff colitis  Oral vanc -- stop date 01/17/12  - avoid PPI -   Epigastric tenderness sx have improved with Pepcid BID  UTI Lactobacillus species w/ < 50, 000 colonies on original UA - repeat UA markedly c/w candida UTI-   ESRD on HD  Nephrology is following - dialysis access (fistula) does not appear to be infected - Vasc Surgery has seen and agrees   Anemia of CKD Follow hemoglobin trend - no evidence of acute blood  loss  Macrocytosis  Check B12 and folic acid level   CAD w/ Hx CABG Asymptomatic   DM2 CBG reasonably controlled at the present time -   HTN Cont to follow w/o change in tx plan today   Pulmonary HTN  GERD   S/P AoVR Does not require chronic anticoagulation due to use of bioprosthetic valve - at risk for endocarditis - for TEE today   Moderate Protein Calorie malnutrition encourage PO intake - Nepro shakes - Magic cup - allow son to bring food from outside  Code Status: NO CODE BLUE Family Communication: no family present at time of exam today - have been communicating with son Disposition Plan: likely will need SNF   Consultants: Nephrology  ID Vasc Surgery   Procedures: TTE TEE - 11/24 - pending    Antibiotics: Vanc 11/16 >> Rifampin 11/18 >> Gentamicin 11/18 >> Nafcillin 11/22 >> Ancef 11/18>>11/21 Rocephin 11/16 >>11/17 Flagyl 11/16 >>11/18  DVT prophylaxis: SCDs  HPI/Subjective: Patient alert- has no complaints other than weakness.   Objective: Blood pressure 171/45, pulse 96, temperature 98.2 F (36.8 C), temperature source Oral, resp. rate 29, height 5\' 2"  (1.575 m), weight 54.8 kg (120 lb 13 oz), SpO2 97.00%.  Intake/Output Summary (Last 24 hours) at 11/29/12 1808 Last data filed at 11/29/12 1239  Gross per 24 hour  Intake    947 ml  Output  1600 ml  Net   -653 ml   Exam: General: awake, alert and oriented x 3, No acute respiratory distress Lungs: Clear to auscultation bilaterally without wheezes or crackles Cardiovascular: Regular rate and rhythm without gallop or rub normal S1 and S2 Abdomen: Nontender, nondistended, soft, bowel sounds positive, no rebound, no ascites, no appreciable mass - rectal bag has small amount of brown stool  Extremities: No significant cyanosis, clubbing, edema bilateral lower extremities  Data Reviewed: Basic Metabolic Panel:  Recent Labs Lab 11/25/12 0808 11/26/12 0547 11/27/12 0355 11/28/12 0445  NA  132* 134* 132* 132*  K 3.9 3.7 3.6 3.7  CL 93* 96 95* 93*  CO2 22 24 25 25   GLUCOSE 157* 147* 165* 199*  BUN 32* 19 30* 18  CREATININE 3.81* 2.88* 3.84* 2.49*  CALCIUM 9.3 8.8 8.5 8.2*  PHOS 2.8  --  3.1 3.4   Liver Function Tests:  Recent Labs Lab 11/25/12 0808 11/27/12 0355 11/28/12 0445  ALBUMIN 2.4* 1.9* 1.8*   CBC:  Recent Labs Lab 11/26/12 0547 11/26/12 0930 11/27/12 0355 11/28/12 0445 11/29/12 1625  WBC 20.1* 22.0* 22.9* 21.6* 18.6*  HGB 10.3* 9.9* 10.1* 10.2* 10.1*  HCT 32.2* 30.7* 31.1* 31.4* 30.6*  MCV 104.9* 104.8* 103.7* 104.7* 103.0*  PLT 176 182 188 196 206   CBG:  Recent Labs Lab 11/28/12 1220 11/28/12 2106 11/29/12 0748 11/29/12 1340 11/29/12 1705  GLUCAP 107* 155* 166* 74 105*    Recent Results (from the past 240 hour(s))  CULTURE, BLOOD (ROUTINE X 2)     Status: None   Collection Time    11/20/12  1:39 PM      Result Value Range Status   Specimen Description BLOOD RIGHT ARM   Final   Special Requests BOTTLES DRAWN AEROBIC AND ANAEROBIC 4CC   Final   Culture  Setup Time     Final   Value: 11/20/2012 17:58     Performed at [facility]   Culture     Final   Value: STAPHYLOCOCCUS AUREUS     Note: RIFAMPIN AND GENTAMICIN SHOULD NOT BE USED AS SINGLE DRUGS FOR TREATMENT OF STAPH INFECTIONS.     Note: Gram Stain Report Called to,Read Back By and Verified With: Bonnee Quin RN on 11/21/12 at 03:10 by Christie Nottingham     Performed at [facility]   Report Status 11/23/2012 FINAL   Final   Organism ID, Bacteria STAPHYLOCOCCUS AUREUS   Final  CULTURE, BLOOD (ROUTINE X 2)     Status: None   Collection Time    11/20/12  2:19 PM      Result Value Range Status   Specimen Description BLOOD RIGHT HAND   Final   Special Requests BOTTLES DRAWN AEROBIC AND ANAEROBIC 10CC   Final   Culture  Setup Time     Final   Value: 11/20/2012 17:58     Performed at [facility]   Culture     Final   Value: STAPHYLOCOCCUS AUREUS     Note:  SUSCEPTIBILITIES PERFORMED ON PREVIOUS CULTURE WITHIN THE LAST 5 DAYS.     Note: Gram Stain Report Called to,Read Back By and Verified With: Bonnee Quin RN on 11/21/12 at 03:10 by Christie Nottingham     Performed at [facility]   Report Status 11/23/2012 FINAL   Final  URINE CULTURE     Status: None   Collection Time    11/20/12  3:05 PM  Result Value Range Status   Specimen Description URINE, CATHETERIZED   Final   Special Requests NONE   Final   Culture  Setup Time     Final   Value: 11/20/2012 20:00     Performed at [facility]   Culture     Final   Value: 50,000 COLONIES/mL LACTOBACILLUS SPECIES     Note: Standardized susceptibility testing for this organism is not available.     Performed at [facility]   Report Status 11/21/2012 FINAL   Final  MRSA PCR SCREENING     Status: Abnormal   Collection Time    11/20/12  5:52 PM      Result Value Range Status   MRSA by PCR POSITIVE (*) NEGATIVE Final   Comment:            The GeneXpert MRSA Assay (FDA     approved for NASAL specimens     only), is one component of a     comprehensive MRSA colonization     surveillance program. It is not     intended to diagnose MRSA     infection nor to guide or     monitor treatment for     MRSA infections.     RESULT CALLED TO, READ BACK BY AND VERIFIED WITH:     GRACOU,R RN 1949 11/20/12 MITCHELL,L  CLOSTRIDIUM DIFFICILE BY PCR     Status: Abnormal   Collection Time    11/20/12  8:27 PM      Result Value Range Status   C difficile by pcr POSITIVE (*) NEGATIVE Final   Comment: CRITICAL RESULT CALLED TO, READ BACK BY AND VERIFIED WITH:     PETTIFORD A RN 11/21/12 0805 COSTELLO B  CULTURE, BLOOD (ROUTINE X 2)     Status: None   Collection Time    11/22/12 12:20 PM      Result Value Range Status   Specimen Description BLOOD RIGHT HAND   Final   Special Requests BOTTLES DRAWN AEROBIC AND ANAEROBIC 10CC   Final   Culture  Setup Time     Final   Value: 11/22/2012  18:09     Performed at [facility]   Culture     Final   Value: NO GROWTH 5 DAYS     Performed at [facility]   Report Status 11/28/2012 FINAL   Final  CULTURE, BLOOD (ROUTINE X 2)     Status: None   Collection Time    11/22/12 12:32 PM      Result Value Range Status   Specimen Description BLOOD RIGHT HAND   Final   Special Requests BOTTLES DRAWN AEROBIC AND ANAEROBIC 10CC   Final   Culture  Setup Time     Final   Value: 11/22/2012 18:10     Performed at [facility]   Culture     Final   Value: NO GROWTH 5 DAYS     Performed at [facility]   Report Status 11/28/2012 FINAL   Final  URINE CULTURE     Status: None   Collection Time    11/27/12  6:47 PM      Result Value Range Status   Specimen Description URINE, CATHETERIZED   Final   Special Requests NONE   Final   Culture  Setup Time     Final   Value: 11/28/2012 00:11     Performed at [facility]  Colony Count     Final   Value: >=100,000 COLONIES/ML     Performed at [facility]   Culture     Final   Value: YEAST     Performed at [facility]   Report Status 11/29/2012 FINAL   Final     Studies:  Recent x-ray studies have been reviewed in detail by the Attending Physician  Scheduled Meds:  Scheduled Meds: . amLODipine  5 mg Oral QPM  . aspirin EC  81 mg Oral Daily  . calcium carbonate  1 tablet Oral TID WC  . [START ON 11/30/2012]  ceFAZolin (ANCEF) IV  2 g Intravenous Q M,W,F-HD  . darbepoetin  25 mcg Intravenous Q Mon-HD  . doxercalciferol  4 mcg Intravenous Q M,W,F-HD  . famotidine (PEPCID) IV  20 mg Intravenous Q24H  . feeding supplement (NEPRO CARB STEADY)  237 mL Oral TID BM  . folic acid  0.5 mg Oral Daily  . [START ON 11/30/2012] gentamicin  60 mg Intravenous Q M,W,F-HD  . insulin aspart  0-9 Units Subcutaneous TID WC  . levothyroxine  75 mcg Oral QAC breakfast  . metoprolol  75 mg Oral BID  . multivitamin  1 tablet Oral QHS  . rifampin   300 mg Oral TID  . sodium chloride  3 mL Intravenous Q12H  . vancomycin  500 mg Oral Q6H  . vitamin C  500 mg Oral Daily    Time spent on care of this patient: 25 mins   Calvert Cantor, MD  [facility] Office  [phone] Pager - Text Page per Loretha Stapler as per below:  On-Call/Text Page:      [email or web address]      password [email or web address]  If 7PM-7AM, please contact night-coverage [email or web address] Password [email or web address] 11/29/2012, 6:08 PM   LOS: 9 days      .

## 2012-11-30 ENCOUNTER — Encounter (HOSPITAL_COMMUNITY): Payer: Self-pay

## 2012-11-30 LAB — GLUCOSE, CAPILLARY: Glucose-Capillary: 159 mg/dL — ABNORMAL HIGH (ref 70–99)

## 2012-11-30 NOTE — Progress Notes (Signed)
[facility] Progress Note [place] TEAM 1 - Stepdown/ICU TEAM   [name] UXL:[id number] DOB: 03/09/1920 DOA: 11/20/2012 PCP: Tillman Abide, MD  Admit HPI / Brief Narrative: 79 y.o. female with past medical history of end-stage renal disease, diabetes mellitus, CAD and prior C. difficile who despite her advanced age and medical problems is rather functional and lives alone with her son nearby. Patient had been in her usual state of health when she complained to her son about chills 3 days prior to admit. She went to dialysis and had a low-grade temp. Dialysis was done partially on that day. Patient visited her PCP and a chest x-ray was unremarkable. She spent the weekend with her son, and was brought to the ED when she was noted to be progressively weaker with diarrhea.  In the emergency room the patient was noted to have a lactic acid level 11.9, anion gap of 24 and a glucose of 206. Her white count was 13.2-92% shift and her urine was positive for UTI. Stool studies were sent for C. difficile as was urine culture and patient was started on IV Rocephin and Flagyl.   Assessment/Plan:  Sepsis due to Staph bacteremia  C diff and now fungal UTI  MRSA bacteremia - 2/2 blood cx + - TTE w/o overt vegetations  - ID and cardio have decided that there is not a dire need for TEE in this frail lady.  - plan for treatment is  - Ancef  for 6 wks  - Natasha Bence to be given through 12/06/12 - Rifampin to stop on 01/03/13  C diff colitis  Oral vanc -- stop date 01/17/12  - avoid PPIs -  GERD with significant Epigastric tenderness and poor appetite sx have improved with Pepcid BID  UTI Lactobacillus species w/ < 50, 000 colonies on original UA  - repeat UA markedly c/w candida UTI- on Diflucan   ESRD on HD  Nephrology is following - dialysis access (fistula) does not appear to be infected - Vasc Surgery has seen and agrees   Anemia of CKD - no evidence of acute blood loss  Macrocytosis   Check B12 and folic acid level   CAD w/ Hx CABG Asymptomatic   DM2 CBG reasonably controlled at the present time -   HTN Cont to follow w/o change in tx plan today   Pulmonary HTN   S/P AoVR Does not require chronic anticoagulation due to use of bioprosthetic valve - at risk for endocarditis   Moderate Protein Calorie malnutrition encourage PO intake - Nepro shakes - Magic cup - allowing son to bring food from outside - pt eats poorly when son is not here to encourage her  Code Status: NO CODE BLUE Family Communication: no family present at time of exam today - have been communicating with son Disposition Plan: likely will need SNF   Consultants: Nephrology  ID Vasc Surgery   Procedures: TTE  Antibiotics: Vanc oral 11/16 >> Rifampin 11/18 >> Gentamicin 11/18 >> Nafcillin 11/22 >>1125 Ancef 11/25 Fluconazole 11/25  Ancef 11/18>>11/21 Rocephin 11/16 >>11/17 Flagyl 11/16 >>11/18   DVT prophylaxis: SCDs  HPI/Subjective: Patient alert- has no complaints other than weakness. Has eating about 1/2 of breakfast. States she would like to go to [place] for rehab.   Objective: Blood pressure 152/59, pulse 87, temperature 98.6 F (37 C), temperature source Oral, resp. rate 27, height 5\' 2"  (1.575 m), weight 54.8 kg (120 lb 13 oz), SpO2 98.00%.  Intake/Output Summary (Last 24 hours)  at 11/30/12 1250 Last data filed at 11/29/12 2208  Gross per 24 hour  Intake 1250.33 ml  Output      0 ml  Net 1250.33 ml   Exam: General: awake, alert and oriented x 3, No acute respiratory distress Lungs: Clear to auscultation bilaterally without wheezes or crackles Cardiovascular: Regular rate and rhythm without gallop or rub normal S1 and S2 Abdomen: Nontender, nondistended, soft, bowel sounds positive, no rebound, no ascites, no appreciable mass  Extremities: No significant cyanosis, clubbing, edema bilateral lower extremities  Data Reviewed: Basic Metabolic Panel:  Recent  Labs Lab 11/25/12 0808 11/26/12 0547 11/27/12 0355 11/28/12 0445  NA 132* 134* 132* 132*  K 3.9 3.7 3.6 3.7  CL 93* 96 95* 93*  CO2 22 24 25 25   GLUCOSE 157* 147* 165* 199*  BUN 32* 19 30* 18  CREATININE 3.81* 2.88* 3.84* 2.49*  CALCIUM 9.3 8.8 8.5 8.2*  PHOS 2.8  --  3.1 3.4   Liver Function Tests:  Recent Labs Lab 11/25/12 0808 11/27/12 0355 11/28/12 0445  ALBUMIN 2.4* 1.9* 1.8*   CBC:  Recent Labs Lab 11/26/12 0547 11/26/12 0930 11/27/12 0355 11/28/12 0445 11/29/12 1625  WBC 20.1* 22.0* 22.9* 21.6* 18.6*  HGB 10.3* 9.9* 10.1* 10.2* 10.1*  HCT 32.2* 30.7* 31.1* 31.4* 30.6*  MCV 104.9* 104.8* 103.7* 104.7* 103.0*  PLT 176 182 188 196 206   CBG:  Recent Labs Lab 11/29/12 1340 11/29/12 1705 11/29/12 2234 11/30/12 0729 11/30/12 1152  GLUCAP 74 105* 151* 116* 138*    Recent Results (from the past 240 hour(s))  CULTURE, BLOOD (ROUTINE X 2)     Status: None   Collection Time    11/20/12  1:39 PM      Result Value Range Status   Specimen Description BLOOD RIGHT ARM   Final   Special Requests BOTTLES DRAWN AEROBIC AND ANAEROBIC 4CC   Final   Culture  Setup Time     Final   Value: 11/20/2012 17:58     Performed at [facility]   Culture     Final   Value: STAPHYLOCOCCUS AUREUS     Note: RIFAMPIN AND GENTAMICIN SHOULD NOT BE USED AS SINGLE DRUGS FOR TREATMENT OF STAPH INFECTIONS.     Note: Gram Stain Report Called to,Read Back By and Verified With: Bonnee Quin RN on 11/21/12 at 03:10 by Christie Nottingham     Performed at [facility]   Report Status 11/23/2012 FINAL   Final   Organism ID, Bacteria STAPHYLOCOCCUS AUREUS   Final  CULTURE, BLOOD (ROUTINE X 2)     Status: None   Collection Time    11/20/12  2:19 PM      Result Value Range Status   Specimen Description BLOOD RIGHT HAND   Final   Special Requests BOTTLES DRAWN AEROBIC AND ANAEROBIC 10CC   Final   Culture  Setup Time     Final   Value: 11/20/2012 17:58     Performed at [facility]   Culture     Final   Value: STAPHYLOCOCCUS AUREUS     Note: SUSCEPTIBILITIES PERFORMED ON PREVIOUS CULTURE WITHIN THE LAST 5 DAYS.     Note: Gram Stain Report Called to,Read Back By and Verified With: Bonnee Quin RN on 11/21/12 at 03:10 by Christie Nottingham     Performed at [facility]   Report Status 11/23/2012 FINAL   Final  URINE CULTURE     Status: None  Collection Time    11/20/12  3:05 PM      Result Value Range Status   Specimen Description URINE, CATHETERIZED   Final   Special Requests NONE   Final   Culture  Setup Time     Final   Value: 11/20/2012 20:00     Performed at [facility]   Culture     Final   Value: 50,000 COLONIES/mL LACTOBACILLUS SPECIES     Note: Standardized susceptibility testing for this organism is not available.     Performed at [facility]   Report Status 11/21/2012 FINAL   Final  MRSA PCR SCREENING     Status: Abnormal   Collection Time    11/20/12  5:52 PM      Result Value Range Status   MRSA by PCR POSITIVE (*) NEGATIVE Final   Comment:            The GeneXpert MRSA Assay (FDA     approved for NASAL specimens     only), is one component of a     comprehensive MRSA colonization     surveillance program. It is not     intended to diagnose MRSA     infection nor to guide or     monitor treatment for     MRSA infections.     RESULT CALLED TO, READ BACK BY AND VERIFIED WITH:     GRACOU,R RN 1949 11/20/12 MITCHELL,L  CLOSTRIDIUM DIFFICILE BY PCR     Status: Abnormal   Collection Time    11/20/12  8:27 PM      Result Value Range Status   C difficile by pcr POSITIVE (*) NEGATIVE Final   Comment: CRITICAL RESULT CALLED TO, READ BACK BY AND VERIFIED WITH:     PETTIFORD A RN 11/21/12 0805 COSTELLO B  CULTURE, BLOOD (ROUTINE X 2)     Status: None   Collection Time    11/22/12 12:20 PM      Result Value Range Status   Specimen Description BLOOD RIGHT HAND   Final   Special Requests BOTTLES DRAWN AEROBIC AND  ANAEROBIC 10CC   Final   Culture  Setup Time     Final   Value: 11/22/2012 18:09     Performed at [facility]   Culture     Final   Value: NO GROWTH 5 DAYS     Performed at [facility]   Report Status 11/28/2012 FINAL   Final  CULTURE, BLOOD (ROUTINE X 2)     Status: None   Collection Time    11/22/12 12:32 PM      Result Value Range Status   Specimen Description BLOOD RIGHT HAND   Final   Special Requests BOTTLES DRAWN AEROBIC AND ANAEROBIC 10CC   Final   Culture  Setup Time     Final   Value: 11/22/2012 18:10     Performed at [facility]   Culture     Final   Value: NO GROWTH 5 DAYS     Performed at [facility]   Report Status 11/28/2012 FINAL   Final  URINE CULTURE     Status: None   Collection Time    11/27/12  6:47 PM      Result Value Range Status   Specimen Description URINE, CATHETERIZED   Final   Special Requests NONE   Final   Culture  Setup Time     Final  Value: 11/28/2012 00:11     Performed at [facility] Count     Final   Value: >=100,000 COLONIES/ML     Performed at [facility]   Culture     Final   Value: YEAST     Performed at [facility]   Report Status 11/29/2012 FINAL   Final     Studies:  Recent x-ray studies have been reviewed in detail by the Attending Physician  Scheduled Meds:  Scheduled Meds: . amLODipine  5 mg Oral QPM  . aspirin EC  81 mg Oral Daily  . calcium carbonate  1 tablet Oral TID WC  .  ceFAZolin (ANCEF) IV  2 g Intravenous Q M,W,F-HD  . darbepoetin  25 mcg Intravenous Q Mon-HD  . doxercalciferol  4 mcg Intravenous Q M,W,F-HD  . famotidine  20 mg Oral Daily  . feeding supplement (NEPRO CARB STEADY)  237 mL Oral TID BM  . fluconazole  100 mg Oral Daily  . folic acid  0.5 mg Oral Daily  . gentamicin  60 mg Intravenous Q M,W,F-HD  . insulin aspart  0-9 Units Subcutaneous TID WC  . levothyroxine  75 mcg Oral QAC breakfast  . metoprolol  75 mg Oral BID   . multivitamin  1 tablet Oral QHS  . rifampin  300 mg Oral TID  . sodium chloride  3 mL Intravenous Q12H  . vancomycin  500 mg Oral Q6H  . vitamin C  500 mg Oral Daily    Time spent on care of this patient: 25 mins   Calvert Cantor, MD  [facility] Office  [phone] Pager - Text Page per Loretha Stapler as per below:  On-Call/Text Page:      [email or web address]      password [email or web address]  If 7PM-7AM, please contact night-coverage [email or web address] Password [email or web address] 11/30/2012, 12:50 PM   LOS: 10 days      .

## 2012-11-30 NOTE — Progress Notes (Signed)
ANTIBIOTIC CONSULT NOTE - FOLLOW UP  Pharmacy Consult for gentamicin Indication: MSSA bacteremia, r/o endocarditis  Allergies  Allergen Reactions  . Captopril     REACTION: unspecified  . Enalapril Maleate     REACTION: cough  . Nitrofurantoin     REACTION: itching  . Ramipril     REACTION: unspecified  . Sulfa Antibiotics Other (See Comments)    Reaction unknown  . Verapamil     REACTION: unspecified    Patient Measurements: Height: 5\' 2"  (157.5 cm) Weight: 120 lb 13 oz (54.8 kg) IBW/kg (Calculated) : 50.1 Adjusted Body Weight:   Vital Signs: Temp: 98.6 F (37 C) (11/26 1100) Temp src: Oral (11/26 1100) BP: 152/59 mmHg (11/26 1100) Pulse Rate: 87 (11/26 1100) Intake/Output from previous day: 11/25 0701 - 11/26 0700 In: 1250.3 [P.O.:640; I.V.:610.3] Out: 1000  Intake/Output from this shift:    Labs:  Recent Labs  11/28/12 0445 11/29/12 1625  WBC 21.6* 18.6*  HGB 10.2* 10.1*  PLT 196 206  CREATININE 2.49*  --    Estimated Creatinine Clearance: 11.9 ml/min (by C-G formula based on Cr of 2.49).  Recent Labs  11/29/12 0500  GENTRANDOM 1.6     Microbiology: Recent Results (from the past 720 hour(s))  CULTURE, BLOOD (ROUTINE X 2)     Status: None   Collection Time    11/20/12  1:39 PM      Result Value Range Status   Specimen Description BLOOD RIGHT ARM   Final   Special Requests BOTTLES DRAWN AEROBIC AND ANAEROBIC 4CC   Final   Culture  Setup Time     Final   Value: 11/20/2012 17:58     Performed at [facility]   Culture     Final   Value: STAPHYLOCOCCUS AUREUS     Note: RIFAMPIN AND GENTAMICIN SHOULD NOT BE USED AS SINGLE DRUGS FOR TREATMENT OF STAPH INFECTIONS.     Note: Gram Stain Report Called to,Read Back By and Verified With: Bonnee Quin RN on 11/21/12 at 03:10 by Christie Nottingham     Performed at [facility]   Report Status 11/23/2012 FINAL   Final   Organism ID, Bacteria STAPHYLOCOCCUS AUREUS   Final  CULTURE, BLOOD (ROUTINE  X 2)     Status: None   Collection Time    11/20/12  2:19 PM      Result Value Range Status   Specimen Description BLOOD RIGHT HAND   Final   Special Requests BOTTLES DRAWN AEROBIC AND ANAEROBIC 10CC   Final   Culture  Setup Time     Final   Value: 11/20/2012 17:58     Performed at [facility]   Culture     Final   Value: STAPHYLOCOCCUS AUREUS     Note: SUSCEPTIBILITIES PERFORMED ON PREVIOUS CULTURE WITHIN THE LAST 5 DAYS.     Note: Gram Stain Report Called to,Read Back By and Verified With: Bonnee Quin RN on 11/21/12 at 03:10 by Christie Nottingham     Performed at [facility]   Report Status 11/23/2012 FINAL   Final  URINE CULTURE     Status: None   Collection Time    11/20/12  3:05 PM      Result Value Range Status   Specimen Description URINE, CATHETERIZED   Final   Special Requests NONE   Final   Culture  Setup Time     Final   Value: 11/20/2012 20:00     Performed  at [facility]     Final   Value: 50,000 COLONIES/mL LACTOBACILLUS SPECIES     Note: Standardized susceptibility testing for this organism is not available.     Performed at [facility]   Report Status 11/21/2012 FINAL   Final  MRSA PCR SCREENING     Status: Abnormal   Collection Time    11/20/12  5:52 PM      Result Value Range Status   MRSA by PCR POSITIVE (*) NEGATIVE Final   Comment:            The GeneXpert MRSA Assay (FDA     approved for NASAL specimens     only), is one component of a     comprehensive MRSA colonization     surveillance program. It is not     intended to diagnose MRSA     infection nor to guide or     monitor treatment for     MRSA infections.     RESULT CALLED TO, READ BACK BY AND VERIFIED WITH:     GRACOU,R RN 1949 11/20/12 MITCHELL,L  CLOSTRIDIUM DIFFICILE BY PCR     Status: Abnormal   Collection Time    11/20/12  8:27 PM      Result Value Range Status   C difficile by pcr POSITIVE (*) NEGATIVE Final   Comment: CRITICAL RESULT CALLED  TO, READ BACK BY AND VERIFIED WITH:     PETTIFORD A RN 11/21/12 0805 COSTELLO B  CULTURE, BLOOD (ROUTINE X 2)     Status: None   Collection Time    11/22/12 12:20 PM      Result Value Range Status   Specimen Description BLOOD RIGHT HAND   Final   Special Requests BOTTLES DRAWN AEROBIC AND ANAEROBIC 10CC   Final   Culture  Setup Time     Final   Value: 11/22/2012 18:09     Performed at [facility]   Culture     Final   Value: NO GROWTH 5 DAYS     Performed at [facility]   Report Status 11/28/2012 FINAL   Final  CULTURE, BLOOD (ROUTINE X 2)     Status: None   Collection Time    11/22/12 12:32 PM      Result Value Range Status   Specimen Description BLOOD RIGHT HAND   Final   Special Requests BOTTLES DRAWN AEROBIC AND ANAEROBIC 10CC   Final   Culture  Setup Time     Final   Value: 11/22/2012 18:10     Performed at [facility]   Culture     Final   Value: NO GROWTH 5 DAYS     Performed at [facility]   Report Status 11/28/2012 FINAL   Final  URINE CULTURE     Status: None   Collection Time    11/27/12  6:47 PM      Result Value Range Status   Specimen Description URINE, CATHETERIZED   Final   Special Requests NONE   Final   Culture  Setup Time     Final   Value: 11/28/2012 00:11     Performed at [facility] Count     Final   Value: >=100,000 COLONIES/ML     Performed at [facility]   Culture     Final   Value: YEAST     Performed at [facility]  Report Status 11/29/2012 FINAL   Final    Anti-infectives   Start     Dose/Rate Route Frequency Ordered Stop   11/30/12 1600  gentamicin (GARAMYCIN) IVPB 60 mg     60 mg 100 mL/hr over 30 Minutes Intravenous Every M-W-F (Hemodialysis) 11/28/12 0950     11/30/12 1200  ceFAZolin (ANCEF) IVPB 2 g/50 mL premix     2 g 100 mL/hr over 30 Minutes Intravenous Every M-W-F (Hemodialysis) 11/29/12 1315     11/29/12 1930  fluconazole (DIFLUCAN) tablet 100 mg      100 mg Oral Daily 11/29/12 1827 12/09/12 0959   11/29/12 1830  fluconazole (DIFLUCAN) tablet 100 mg  Status:  Discontinued     100 mg Oral Daily 11/29/12 1827 11/29/12 1827   11/29/12 1730  rifampin (RIFADIN) capsule 300 mg     300 mg Oral 3 times daily 11/29/12 1546     11/29/12 1600  gentamicin (GARAMYCIN) IVPB 60 mg     60 mg 100 mL/hr over 30 Minutes Intravenous Every Hemodialysis 11/28/12 0952     11/29/12 1400  ceFAZolin (ANCEF) IVPB 2 g/50 mL premix     2 g 100 mL/hr over 30 Minutes Intravenous  Once 11/29/12 1315 11/29/12 1505   11/26/12 1800  nafcillin 2 g in dextrose 5 % 50 mL IVPB  Status:  Discontinued     2 g 100 mL/hr over 30 Minutes Intravenous 6 times per day 11/26/12 1710 11/29/12 1222   11/23/12 2000  ceFAZolin (ANCEF) IVPB 1 g/50 mL premix  Status:  Discontinued     1 g 100 mL/hr over 30 Minutes Intravenous Every M-W-F (2000) 11/22/12 1138 11/22/12 1139   11/23/12 2000  ceFAZolin (ANCEF) IVPB 2 g/50 mL premix  Status:  Discontinued     2 g 100 mL/hr over 30 Minutes Intravenous Every M-W-F (2000) 11/22/12 1139 11/26/12 1710   11/23/12 1600  gentamicin (GARAMYCIN) IVPB 60 mg  Status:  Discontinued     60 mg 100 mL/hr over 30 Minutes Intravenous Every M-W-F (Hemodialysis) 11/22/12 1526 11/28/12 0950   11/22/12 1530  gentamicin (GARAMYCIN) IVPB 80 mg     80 mg 100 mL/hr over 30 Minutes Intravenous NOW 11/22/12 1524 11/22/12 1655   11/22/12 1500  rifampin (RIFADIN) 300 mg in sodium chloride 0.9 % 100 mL IVPB  Status:  Discontinued     300 mg 200 mL/hr over 30 Minutes Intravenous 3 times per day 11/22/12 1359 11/29/12 1546   11/22/12 1200  vancomycin (VANCOCIN) IVPB 1000 mg/200 mL premix     1,000 mg 200 mL/hr over 60 Minutes Intravenous STAT 11/22/12 1114 11/22/12 1347   11/22/12 1200  ceFAZolin (ANCEF) IVPB 2 g/50 mL premix     2 g 100 mL/hr over 30 Minutes Intravenous STAT 11/22/12 1138 11/22/12 1316   11/21/12 1200  vancomycin (VANCOCIN) 500 mg in sodium chloride  0.9 % 100 mL IVPB  Status:  Discontinued     500 mg 100 mL/hr over 60 Minutes Intravenous Every M-W-F (Hemodialysis) 11/21/12 0928 11/23/12 0909   11/21/12 0930  vancomycin (VANCOCIN) 1,250 mg in sodium chloride 0.9 % 250 mL IVPB     1,250 mg 166.7 mL/hr over 90 Minutes Intravenous STAT 11/21/12 0927 11/22/12 0930   11/20/12 1800  cefTRIAXone (ROCEPHIN) 1 g in dextrose 5 % 50 mL IVPB  Status:  Discontinued     1 g 100 mL/hr over 30 Minutes Intravenous Every 24 hours 11/20/12 1756 11/22/12 1120   11/20/12 1800  vancomycin (VANCOCIN) 50 mg/mL oral solution 500 mg     500 mg Oral 4 times per day 11/20/12 1756 12/04/12 1759   11/20/12 1756  metroNIDAZOLE (FLAGYL) IVPB 500 mg  Status:  Discontinued     500 mg 100 mL/hr over 60 Minutes Intravenous Every 8 hours 11/20/12 1756 11/22/12 1400   11/20/12 1545  metroNIDAZOLE (FLAGYL) IVPB 500 mg     500 mg 100 mL/hr over 60 Minutes Intravenous  Once 11/20/12 1535 11/20/12 1725   11/20/12 1545  cefTRIAXone (ROCEPHIN) 1 g in dextrose 5 % 50 mL IVPB     1 g 100 mL/hr over 30 Minutes Intravenous  Once 11/20/12 1538 11/20/12 1724      Assessment: 89 yof continues on Gent/rifampin D#10 + ancef D2 for MSSA bacteremia & r/o bioprosthetic endocarditis + vanc PO D#10 for Cdiff. Planning 6 weeks of treatment and 2 weeks of gentamicin synergy. Of note pt is ESRD on HD MWF and noted post HD 11/29/12 d/t holiday.  Patient for repeat HD today and antibiotics have been scheduled. Noted ID plans for ancef for 6 weeks, vanc po until 01/17/12, gent until 12/06/12.  Noted on 11/26 gentamicin was not documented as given. With last gentamicin peak of 1.6 and used for synergy, anticipate missed dose (if truly not given) should not significantly impact treatment.    Vanc 11/18 >> 11/19 CTX 11/17 >> 11/18 Vanc po 11/17 >> Flagyl IV 11/17 >>11/20 Gent 11/18>> Cefazolin 11/19>>11/21 Rifampin 11/18 >>  Nafcillin 11/22>> 11/25 11/25 ancef>>  11/16 UCx: 50k lactobacillus  (likely colonization) 11/16 BCx: MSSA 11/18 BCx: NEG 11/16 MRA - POS 11/16 Cdiff - POS  Goal of Therapy:  Pre-HD Gent level </= 4  Plan:  1. Continue gentamicin 60mg  post-HD (no make up dosing) 2. F/u renal plans, C&S, clinical status and ID plans 3. F/u TEE results  Harland German, Pharm D 11/30/2012 12:02 PM

## 2012-11-30 NOTE — Progress Notes (Signed)
CCMD notifed RN that patient is in accelerated junctional rhythm sustained. Pt asymptomatic, resting with eyes closed at this time. Vitals 155/72, HR 84, O2 sats 97% on room air. MD notified. Awaiting call back.

## 2012-11-30 NOTE — Progress Notes (Addendum)
Clinical Social Work Department BRIEF PSYCHOSOCIAL ASSESSMENT 11/30/2012  Patient:  [name], [name]     Account Number:  [id number]     Admit date:  11/20/2012  Clinical Social Worker:  Varney Biles  Date/Time:  11/30/2012 03:02 PM  Referred by:  Physician  Date Referred:  11/30/2012 Referred for  SNF Placement   Other Referral:   Interview type:  Patient Other interview type:   CSW also spoke with pt's [name].    PSYCHOSOCIAL DATA Living Status:  ALONE Admitted from facility:   Level of care:   Primary support name:  [name] [phone]) Primary support relationship to patient:  CHILD, ADULT Degree of support available:   Good--pt's [name] actively involved in pt care.    CURRENT CONCERNS Current Concerns  Post-Acute Placement   Other Concerns:    SOCIAL WORK ASSESSMENT / PLAN CSW introduced self and explained SNF search and that medical team thinks pt could benefit from rehab before returning home. Pt agreeable and first choice is [facility]. CSW asked if this is a continuing care retirement community [facility]), as this would imply that pt needs to go in at independent living level and filter through levels of care, that she could not go directly in to skilled level. Pt states that she has friends who have gone straight to SNF after surgeries before, and CSW called facility and left voicemail requesting return call confirming they can take pts who need SNF. CSW sent clinicals to [facility] and [facility], as pt states this is her second choice. Pt expressed that she is "ready to die." CSW asked if she would speak more to that, and pt states that she has said it "time and time again," and CSW asked if she has mentioned this to her MD. Pt says she has not. CSW asked if pt would like a goals of care meeting, explaining that CSW can have this arranged. Pt refused. CSW asked if there are family members involved that CSW should call. Pt states her [name] is POA  and he should be consulted in any decisions. CSW called [name] and [name] confirmed that [facility] and [facility] are the top two facilities. CSW asked if she can send information to others, and [name] prefers to see if one or both of these SNFs can offer a bed before expanding the search. Charlie asked how his mother is doing, and CSW explained that she was very tired during conversation. Her eyes were closed when CSW entered the room, and pt closed eyes for extended periods of time during discussion. CSW also mentioned that pt expressed she is exhausted and ready to die--Charlie states that he believes his mother said this because she is exhausted and that she does not mean this statement. CSW asked if he might be interested in having a conversation with pt and palliative about hospice or comfort care options, and Charlie refused stating "I'm not ready to give her up yet." CSW expressed that she hears where [name] is coming from and that CSW can be available if [name] thinks of other facilities he would like clinicals sent to--Charlie appreciative of CSW assistance.   Assessment/plan status:  Psychosocial Support/Ongoing Assessment of Needs Other assessment/ plan:   Information/referral to community resources:   SNF (First choice is [facility], second is [facility])    PATIENT'S/FAMILY'S RESPONSE TO PLAN OF CARE: [name] very friendly over the phone, expressed concern for his mother and that he is actively involved in her care and is  POA. Pt appeared tired during CSW discussion, closing eyes intermittently throughout discussion and stating "I'm very weak." Pt says first choice is [facility] and second is [facility], and if neither of these facilities have a bed her [name] is the POA and can make her decisions for her. Pt transferred to new unit, and new CSW made aware of case in a handoff.       Maryclare Labrador, MSW, [facility] Clinical Social Worker [phone]

## 2012-11-30 NOTE — Progress Notes (Signed)
Assessment/Plan:  1. MSSA bacteremia / hx prosthetic AVR: TTE negative for vegetation, on ancef/rifampin per ID, AV fistula has no signs of infection;  2. Cdif+ diarrhea: on po vanc 3. ESRD: MWF. HD today (holiday sched) then again on Friday.  4. HTN/volume - ok 5. CABG/AVR 2009  Subjective: Interval History: 6 weeks of ancef and rifampin planned per ID with stop date 01/03/13  Objective: Vital signs in last 24 hours: Temp:  [98 F (36.7 C)-99.6 F (37.6 C)] 98.4 F (36.9 C) (11/26 0730) Pulse Rate:  [81-96] 85 (11/26 0730) Resp:  [20-34] 28 (11/26 0800) BP: (121-171)/(41-53) 137/53 mmHg (11/26 0800) SpO2:  [97 %-98 %] 97 % (11/26 0730) Weight change:   Intake/Output from previous day: 11/25 0701 - 11/26 0700 In: 1250.3 [P.O.:640; I.V.:610.3] Out: 1000  Intake/Output this shift:    General appearance: alert and cooperative GI: soft, non-tender; bowel sounds normal; no masses,  no organomegaly Neurologic: Grossly normal HOH  Lab Results:  Recent Labs  11/28/12 0445 11/29/12 1625  WBC 21.6* 18.6*  HGB 10.2* 10.1*  HCT 31.4* 30.6*  PLT 196 206   BMET:  Recent Labs  11/28/12 0445  NA 132*  K 3.7  CL 93*  CO2 25  GLUCOSE 199*  BUN 18  CREATININE 2.49*  CALCIUM 8.2*   No results found for this basename: PTH,  in the last 72 hours Iron Studies: No results found for this basename: IRON, TIBC, TRANSFERRIN, FERRITIN,  in the last 72 hours Studies/Results: No results found.  Scheduled: . amLODipine  5 mg Oral QPM  . aspirin EC  81 mg Oral Daily  . calcium carbonate  1 tablet Oral TID WC  .  ceFAZolin (ANCEF) IV  2 g Intravenous Q M,W,F-HD  . darbepoetin  25 mcg Intravenous Q Mon-HD  . doxercalciferol  4 mcg Intravenous Q M,W,F-HD  . famotidine  20 mg Oral Daily  . feeding supplement (NEPRO CARB STEADY)  237 mL Oral TID BM  . fluconazole  100 mg Oral Daily  . folic acid  0.5 mg Oral Daily  . gentamicin  60 mg Intravenous Q M,W,F-HD  . insulin aspart  0-9  Units Subcutaneous TID WC  . levothyroxine  75 mcg Oral QAC breakfast  . metoprolol  75 mg Oral BID  . multivitamin  1 tablet Oral QHS  . rifampin  300 mg Oral TID  . sodium chloride  3 mL Intravenous Q12H  . vancomycin  500 mg Oral Q6H  . vitamin C  500 mg Oral Daily     LOS: 10 days   [name] C 11/30/2012,10:02 AM

## 2012-11-30 NOTE — Progress Notes (Signed)
Pt refusing dinner until son arrives, requesting son assist with feeding. Will continue to monitor.

## 2012-11-30 NOTE — Progress Notes (Addendum)
Clinical Social Work Department CLINICAL SOCIAL WORK PLACEMENT NOTE 11/30/2012  Patient:  [name], [name]  Account Number:  [id number] Admit date:  11/20/2012  Clinical Social Worker:  Maryclare Labrador, Theresia Majors  Date/time:  11/30/2012 03:14 PM  Clinical Social Work is seeking post-discharge placement for this patient at the following level of care:   SKILLED NURSING   (*CSW will update this form in Epic as items are completed)   Pt refused - Patient/family provided with [facility] Department of Clinical Social Work's list of facilities offering this level of care within the geographic area requested by the patient (or if unable, by the patient's family).  [name] - Patient/family informed of their freedom to choose among providers that offer the needed level of care, that participate in Medicare, Medicaid or managed care program needed by the patient, have an available bed and are willing to accept the patient.  [name] - Patient/family informed of [facility]' ownership interest in [facility], as well as of the fact that they are under no obligation to receive care at this facility.  PASARR submitted to EDS on EXISTING PASARR number received from EDS on EXISTING  FL2 transmitted to all facilities in geographic area requested by pt/family on  11/30/2012 FL2 transmitted to all facilities within larger geographic area on   Patient informed that his/her managed care company has contracts with or will negotiate with  certain facilities, including the following:     Patient/family informed of bed offers received: 12/05/12   Patient/family chooses bed at [facility] Physician recommends and patient chooses bed at    Patient to be transferred to [facility] on 12/06/12  Patient to be transferred to facility by son [name]  The following physician request were entered in Epic:  Additional Comments: 11/28: Faxed clinical information to other SNF's in [facility]. Alvina Chou, LCSW) 12/05/12 - CSW contacted [facility] [phone]) and left message for admissions staff. Also sent additional clinicals to facility as requested. CSW also contacted [facility] and spoke with Steward Drone [phone]) requesting that clinicals be reviewed. Steward Drone requested that clinicals be sent via PL.  12/06/12 - Per son, will arrive at hospital to transport patient between 5:30-6 pm.    Maryclare Labrador, MSW, [facility] Clinical Social Worker [phone]

## 2012-12-01 DIAGNOSIS — N189 Chronic kidney disease, unspecified: Secondary | ICD-10-CM

## 2012-12-01 DIAGNOSIS — D631 Anemia in chronic kidney disease: Secondary | ICD-10-CM

## 2012-12-01 LAB — CBC
HCT: 27 % — ABNORMAL LOW (ref 36.0–46.0)
Hemoglobin: 8.8 g/dL — ABNORMAL LOW (ref 12.0–15.0)
MCH: 33.7 pg (ref 26.0–34.0)
MCHC: 32.6 g/dL (ref 30.0–36.0)
MCV: 103.4 fL — ABNORMAL HIGH (ref 78.0–100.0)
Platelets: 177 10*3/uL (ref 150–400)
RBC: 2.61 MIL/uL — ABNORMAL LOW (ref 3.87–5.11)

## 2012-12-01 LAB — GLUCOSE, CAPILLARY
Glucose-Capillary: 109 mg/dL — ABNORMAL HIGH (ref 70–99)
Glucose-Capillary: 122 mg/dL — ABNORMAL HIGH (ref 70–99)
Glucose-Capillary: 109 mg/dL — ABNORMAL HIGH (ref 70–99)

## 2012-12-01 MED ORDER — DIPHENHYDRAMINE HCL 12.5 MG/5ML PO ELIX
12.5000 mg | ORAL_SOLUTION | Freq: Once | ORAL | Status: DC
  Filled 2012-12-01: qty 5

## 2012-12-01 NOTE — Progress Notes (Signed)
Subjective:  Coughing  Up bloody sputum , Nose bleed this am per Her RN, "Im Sleepy" Objective Vital signs in last 24 hours: Filed Vitals:   11/30/12 1757 11/30/12 2100 12/01/12 0505 12/01/12 0929  BP: 152/72 155/72 154/71 169/65  Pulse: 85 84 87 91  Temp: 99 F (37.2 C) 98.2 F (36.8 C) 98.4 F (36.9 C) 98.6 F (37 C)  TempSrc: Oral Oral Oral   Resp: 22 20 19 16   Height:  5\' 2"  (1.575 m)    Weight:  52.6 kg (115 lb 15.4 oz)    SpO2: 95% 97% 95% 97%   Weight change:   Intake/Output Summary (Last 24 hours) at 12/01/12 1044 Last data filed at 12/01/12 0929  Gross per 24 hour  Intake    220 ml  Output      0 ml  Net    220 ml   Labs: Basic Metabolic Panel:  Recent Labs Lab 11/25/12 0808 11/26/12 0547 11/27/12 0355 11/28/12 0445  NA 132* 134* 132* 132*  K 3.9 3.7 3.6 3.7  CL 93* 96 95* 93*  CO2 22 24 25 25   GLUCOSE 157* 147* 165* 199*  BUN 32* 19 30* 18  CREATININE 3.81* 2.88* 3.84* 2.49*  CALCIUM 9.3 8.8 8.5 8.2*  PHOS 2.8  --  3.1 3.4   Liver Function Tests:  Recent Labs Lab 11/25/12 0808 11/27/12 0355 11/28/12 0445  ALBUMIN 2.4* 1.9* 1.8*    Recent Labs Lab 11/26/12 0930 11/27/12 0355 11/28/12 0445 11/29/12 1625 12/01/12 0533  WBC 22.0* 22.9* 21.6* 18.6* 18.7*  HGB 9.9* 10.1* 10.2* 10.1* 8.8*  HCT 30.7* 31.1* 31.4* 30.6* 27.0*  MCV 104.8* 103.7* 104.7* 103.0* 103.4*  PLT 182 188 196 206 177  CBG:  Recent Labs Lab 11/30/12 0729 11/30/12 1152 11/30/12 1617 11/30/12 2057 12/01/12 0732  GLUCAP 116* 138* 159* 147* 109*    Medications: . sodium chloride 500 mL (11/28/12 1453)   . amLODipine  5 mg Oral QPM  . aspirin EC  81 mg Oral Daily  . calcium carbonate  1 tablet Oral TID WC  .  ceFAZolin (ANCEF) IV  2 g Intravenous Q M,W,F-HD  . darbepoetin  25 mcg Intravenous Q Mon-HD  . diphenhydrAMINE  12.5 mg Oral Once  . doxercalciferol  4 mcg Intravenous Q M,W,F-HD  . famotidine  20 mg Oral Daily  . feeding supplement (NEPRO CARB STEADY)   237 mL Oral TID BM  . fluconazole  100 mg Oral Daily  . folic acid  0.5 mg Oral Daily  . gentamicin  60 mg Intravenous Q M,W,F-HD  . insulin aspart  0-9 Units Subcutaneous TID WC  . levothyroxine  75 mcg Oral QAC breakfast  . metoprolol  75 mg Oral BID  . multivitamin  1 tablet Oral QHS  . rifampin  300 mg Oral TID  . sodium chloride  3 mL Intravenous Q12H  . vancomycin  500 mg Oral Q6H  . vitamin C  500 mg Oral Daily    Physical Exam: General: Alert thin elderly WF Ox3 chronically ill appearance flat affect Heart: RRR 3/6 mur aortic  Region no rub Lungs: CTA  Abdomen:  BS pos. Soft , non tender, black stool in rectal tube Extremities: Dialysis Access:  No pedal edema/ Pos bruit L U ARM AVF   Problem/Plan: 1.  MSSA Bacteremia with HO AV Prothesis and neg veg on echo= ID following / AVF no signs of infection with 6 weeks ancef and rifampin  stop  day 01/03/13  And Gentamicin stop on dec 2 per ID 2. C . Difficle pos. = on po VANCO. 3. ESRD - MWF ( [facility])  On schedule now hd in am 4. Anemia - hgb 10.1>8.8  This am  Low dose aranesp  increase/ no iron with bacteremia 5. Secondary hyperparathyroidism - CA carb binder/ ca phos stable no vit d 6. HTN/volume -  Bp 169/65 7. HO CABG/AVR 2009  Lenny Pastel, PA-C [facility] Beeper [phone] 12/01/2012,10:44 AM  LOS: 11 days    Renal Attending.  As above agre with note by Mr. Stark Klein.  She still has a rectal tube with diarrhea.  For HD Friday. Farzad Tibbetts C

## 2012-12-01 NOTE — Progress Notes (Signed)
Pt with 2 episodes of epistaxis, clotted, bright red. Pt is also coughing up clotted bright red blood (appears to be from nose). Dr Benjamine Mola aware. Will continue to monitor.

## 2012-12-01 NOTE — Progress Notes (Signed)
[facility] PROGRESS NOTE  [name] EAV:[id number] DOB: 05/07/1920 DOA: 11/20/2012 PCP: Tillman Abide, MD  Assessment/Plan: Sepsis  due to Staph bacteremia C diff and now fungal UTI   MRSA bacteremia - 2/2 blood cx +  - TTE w/o overt vegetations  - ID and cardio have decided that there is not a dire need for TEE in this frail lady.  - plan for treatment is   - Ancef for 6 wks  - Natasha Bence to be given through 12/06/12  - Rifampin to stop on 01/03/13   C diff colitis  Oral vanc -- stop date 01/17/12  - avoid PPIs -   GERD with significant Epigastric tenderness and poor appetite  sx have improved with Pepcid BID   UTI  Lactobacillus species w/ < 50, 000 colonies on original UA  - repeat UA markedly c/w candida UTI- on Diflucan   ESRD on HD  Nephrology is following - dialysis access (fistula) does not appear to be infected - Vasc Surgery has seen and agrees   Anemia of CKD  - no evidence of acute blood loss   Macrocytosis  B12 ok  CAD w/ Hx CABG  Asymptomatic   DM2  CBG reasonably controlled at the present time -   HTN  Cont to follow w/o change in tx plan today   Pulmonary HTN  S/P AoVR  Does not require chronic anticoagulation due to use of bioprosthetic valve - at risk for endocarditis   Moderate Protein Calorie malnutrition  encourage PO intake - Nepro shakes - Magic cup - allowing son to bring food from outside  - pt eats poorly when son is not here to encourage her   Code Status: NO CODE BLUE  Family Communication: no family present at time of exam today - have been communicating with son  Disposition Plan: likely will need SNF   Consultants:  Nephrology  ID  Vasc Surgery   Procedures:  TTE   Antibiotics:  Vanc oral 11/16 >>  Rifampin 11/18 >>  Gentamicin 11/18 >>  Nafcillin 11/22 >>1125  Ancef 11/25  Fluconazole 11/25  Ancef 11/18>>11/21  Rocephin 11/16 >>11/17  Flagyl 11/16 >>11/18   DVT prophylaxis:   SCDs    HPI/Subjective: Nose bleed this AM  Objective: Filed Vitals:   12/01/12 0929  BP: 169/65  Pulse: 91  Temp: 98.6 F (37 C)  Resp: 16    Intake/Output Summary (Last 24 hours) at 12/01/12 1053 Last data filed at 12/01/12 0929  Gross per 24 hour  Intake    220 ml  Output      0 ml  Net    220 ml   Filed Weights   11/28/12 0359 11/29/12 0400 11/30/12 2100  Weight: 54.8 kg (120 lb 13 oz) 54.8 kg (120 lb 13 oz) 52.6 kg (115 lb 15.4 oz)    Exam:  General: awake, alert and oriented x 3, No acute respiratory distress  Lungs: Clear to auscultation bilaterally without wheezes or crackles  Cardiovascular: Regular rate and rhythm without gallop or rub normal S1 and S2  Abdomen: Nontender, nondistended, soft, bowel sounds positive, no rebound, no ascites, no appreciable mass  Extremities: No significant cyanosis, clubbing, edema bilateral lower extremities   Data Reviewed: Basic Metabolic Panel:  Recent Labs Lab 11/25/12 0808 11/26/12 0547 11/27/12 0355 11/28/12 0445  NA 132* 134* 132* 132*  K 3.9 3.7 3.6 3.7  CL 93* 96 95* 93*  CO2 22 24 25 25   GLUCOSE 157* 147*  165* 199*  BUN 32* 19 30* 18  CREATININE 3.81* 2.88* 3.84* 2.49*  CALCIUM 9.3 8.8 8.5 8.2*  PHOS 2.8  --  3.1 3.4   Liver Function Tests:  Recent Labs Lab 11/25/12 0808 11/27/12 0355 11/28/12 0445  ALBUMIN 2.4* 1.9* 1.8*   No results found for this basename: LIPASE, AMYLASE,  in the last 168 hours No results found for this basename: AMMONIA,  in the last 168 hours CBC:  Recent Labs Lab 11/26/12 0930 11/27/12 0355 11/28/12 0445 11/29/12 1625 12/01/12 0533  WBC 22.0* 22.9* 21.6* 18.6* 18.7*  HGB 9.9* 10.1* 10.2* 10.1* 8.8*  HCT 30.7* 31.1* 31.4* 30.6* 27.0*  MCV 104.8* 103.7* 104.7* 103.0* 103.4*  PLT 182 188 196 206 177   Cardiac Enzymes: No results found for this basename: CKTOTAL, CKMB, CKMBINDEX, TROPONINI,  in the last 168 hours BNP (last 3 results) No results found for this  basename: PROBNP,  in the last 8760 hours CBG:  Recent Labs Lab 11/30/12 0729 11/30/12 1152 11/30/12 1617 11/30/12 2057 12/01/12 0732  GLUCAP 116* 138* 159* 147* 109*    Recent Results (from the past 240 hour(s))  CULTURE, BLOOD (ROUTINE X 2)     Status: None   Collection Time    11/22/12 12:20 PM      Result Value Range Status   Specimen Description BLOOD RIGHT HAND   Final   Special Requests BOTTLES DRAWN AEROBIC AND ANAEROBIC 10CC   Final   Culture  Setup Time     Final   Value: 11/22/2012 18:09     Performed at [facility]   Culture     Final   Value: NO GROWTH 5 DAYS     Performed at [facility]   Report Status 11/28/2012 FINAL   Final  CULTURE, BLOOD (ROUTINE X 2)     Status: None   Collection Time    11/22/12 12:32 PM      Result Value Range Status   Specimen Description BLOOD RIGHT HAND   Final   Special Requests BOTTLES DRAWN AEROBIC AND ANAEROBIC 10CC   Final   Culture  Setup Time     Final   Value: 11/22/2012 18:10     Performed at [facility]   Culture     Final   Value: NO GROWTH 5 DAYS     Performed at [facility]   Report Status 11/28/2012 FINAL   Final  URINE CULTURE     Status: None   Collection Time    11/27/12  6:47 PM      Result Value Range Status   Specimen Description URINE, CATHETERIZED   Final   Special Requests NONE   Final   Culture  Setup Time     Final   Value: 11/28/2012 00:11     Performed at [facility] Count     Final   Value: >=100,000 COLONIES/ML     Performed at [facility]   Culture     Final   Value: YEAST     Performed at [facility]   Report Status 11/29/2012 FINAL   Final     Studies: No results found.  Scheduled Meds: . amLODipine  5 mg Oral QPM  . aspirin EC  81 mg Oral Daily  . calcium carbonate  1 tablet Oral TID WC  .  ceFAZolin (ANCEF) IV  2 g Intravenous Q M,W,F-HD  . darbepoetin  25 mcg  Intravenous Q Mon-HD  . diphenhydrAMINE  12.5  mg Oral Once  . doxercalciferol  4 mcg Intravenous Q M,W,F-HD  . famotidine  20 mg Oral Daily  . feeding supplement (NEPRO CARB STEADY)  237 mL Oral TID BM  . fluconazole  100 mg Oral Daily  . folic acid  0.5 mg Oral Daily  . gentamicin  60 mg Intravenous Q M,W,F-HD  . insulin aspart  0-9 Units Subcutaneous TID WC  . levothyroxine  75 mcg Oral QAC breakfast  . metoprolol  75 mg Oral BID  . multivitamin  1 tablet Oral QHS  . rifampin  300 mg Oral TID  . sodium chloride  3 mL Intravenous Q12H  . vancomycin  500 mg Oral Q6H  . vitamin C  500 mg Oral Daily   Continuous Infusions: . sodium chloride 500 mL (11/28/12 1453)    Principal Problem:   Sepsis Active Problems:   Type II or unspecified type diabetes mellitus with renal manifestations, not stated as uncontrolled(250.40)   HYPERTENSION   PULMONARY HYPERTENSION   GERD   S/P aortic valve replacement   End stage renal disease   Diarrhea   UTI (lower urinary tract infection)   Leukocytosis, unspecified    Time spent: 35    [facility], Mita Vallo  [facility] Pager [phone]. If 7PM-7AM, please contact night-coverage at [email or web address], password [email or web address] 12/01/2012, 10:53 AM  LOS: 11 days

## 2012-12-02 LAB — RENAL FUNCTION PANEL
Albumin: 1.9 g/dL — ABNORMAL LOW (ref 3.5–5.2)
BUN: 43 mg/dL — ABNORMAL HIGH (ref 6–23)
CO2: 13 mEq/L — ABNORMAL LOW (ref 19–32)
Calcium: 8 mg/dL — ABNORMAL LOW (ref 8.4–10.5)
Chloride: 86 mEq/L — ABNORMAL LOW (ref 96–112)
Creatinine, Ser: 5.88 mg/dL — ABNORMAL HIGH (ref 0.50–1.10)
GFR calc non Af Amer: 6 mL/min — ABNORMAL LOW (ref >90)
Glucose, Bld: 97 mg/dL (ref 70–99)
Phosphorus: 10.7 mg/dL — ABNORMAL HIGH (ref 2.3–4.6)
Sodium: 129 mEq/L — ABNORMAL LOW (ref 135–145)

## 2012-12-02 LAB — BASIC METABOLIC PANEL
BUN: 37 mg/dL — ABNORMAL HIGH (ref 6–23)
CO2: 16 mEq/L — ABNORMAL LOW (ref 19–32)
Calcium: 8.2 mg/dL — ABNORMAL LOW (ref 8.4–10.5)
Chloride: 89 mEq/L — ABNORMAL LOW (ref 96–112)
Glucose, Bld: 78 mg/dL (ref 70–99)
Potassium: 5.1 mEq/L (ref 3.5–5.1)
Sodium: 130 mEq/L — ABNORMAL LOW (ref 135–145)

## 2012-12-02 LAB — CBC
Hemoglobin: 9.2 g/dL — ABNORMAL LOW (ref 12.0–15.0)
MCH: 33.9 pg (ref 26.0–34.0)
Platelets: 132 10*3/uL — ABNORMAL LOW (ref 150–400)
RBC: 2.71 MIL/uL — ABNORMAL LOW (ref 3.87–5.11)
WBC: 35.5 10*3/uL — ABNORMAL HIGH (ref 4.0–10.5)

## 2012-12-02 LAB — GLUCOSE, CAPILLARY
Glucose-Capillary: 103 mg/dL — ABNORMAL HIGH (ref 70–99)
Glucose-Capillary: 147 mg/dL — ABNORMAL HIGH (ref 70–99)
Glucose-Capillary: 135 mg/dL — ABNORMAL HIGH (ref 70–99)
Glucose-Capillary: 51 mg/dL — ABNORMAL LOW (ref 70–99)

## 2012-12-02 MED ORDER — LIDOCAINE HCL (PF) 1 % IJ SOLN: 5.0000 mL | INTRAMUSCULAR | Status: DC | PRN

## 2012-12-02 MED ORDER — PENTAFLUOROPROP-TETRAFLUOROETH EX AERO: 1.0000 "application " | INHALATION_SPRAY | CUTANEOUS | Status: DC | PRN

## 2012-12-02 MED ORDER — HEPARIN SODIUM (PORCINE) 1000 UNIT/ML DIALYSIS: 1000.0000 [IU] | INTRAMUSCULAR | Status: DC | PRN

## 2012-12-02 MED ORDER — DEXTROSE 50 % IV SOLN: 25.0000 mL | Freq: Once | INTRAVENOUS | Status: AC | PRN

## 2012-12-02 MED ORDER — DOXERCALCIFEROL 4 MCG/2ML IV SOLN
INTRAVENOUS | Status: AC
  Administered 2012-12-02: 4 ug
  Filled 2012-12-02: qty 2

## 2012-12-02 MED ORDER — DEXTROSE 50 % IV SOLN
INTRAVENOUS | Status: AC
  Administered 2012-12-02: 25 mL
  Filled 2012-12-02: qty 50

## 2012-12-02 MED ORDER — NEPRO/CARBSTEADY PO LIQD: 237.0000 mL | ORAL | Status: DC | PRN

## 2012-12-02 MED ORDER — LIDOCAINE-PRILOCAINE 2.5-2.5 % EX CREA: 1.0000 "application " | TOPICAL_CREAM | CUTANEOUS | Status: DC | PRN

## 2012-12-02 MED ORDER — SODIUM CHLORIDE 0.9 % IV SOLN: 100.0000 mL | INTRAVENOUS | Status: DC | PRN

## 2012-12-02 MED ORDER — PENTAFLUOROPROP-TETRAFLUOROETH EX AERO
INHALATION_SPRAY | CUTANEOUS | Status: AC
  Administered 2012-12-02: 16:00:00
  Filled 2012-12-02: qty 103.5

## 2012-12-02 MED ORDER — ALTEPLASE 2 MG IJ SOLR: 2.0000 mg | Freq: Once | INTRAMUSCULAR | Status: DC | PRN

## 2012-12-02 MED ORDER — HEPARIN SODIUM (PORCINE) 1000 UNIT/ML DIALYSIS: 20.0000 [IU]/kg | INTRAMUSCULAR | Status: DC | PRN

## 2012-12-02 NOTE — Progress Notes (Signed)
OT Cancellation   12/02/12 1600  OT Visit Information  Last OT Received On 12/02/12  Reason Eval/Treat Not Completed Patient at procedure or test/ unavailable (HD)  [facility], OTR/L  [phone] 12/02/2012

## 2012-12-02 NOTE — Progress Notes (Signed)
[facility] PROGRESS NOTE  [name]:[id number] DOB: 05-20-1920 DOA: 11/20/2012 PCP: Tillman Abide, MD  Assessment/Plan: Sepsis  due to Staph bacteremia C diff and now fungal UTI   Leukocytosis -continue to monitor  MRSA bacteremia - 2/2 blood cx +  - TTE w/o overt vegetations  - ID and cardio have decided that there is not a dire need for TEE in this frail lady.  - plan for treatment is :  - Ancef for 6 wks with HD through 12/30  - Gent to be given through 12/06/12  - Rifampin to stop on 01/03/13   C diff colitis  Oral vanc -- stop date 01/17/12  - avoid PPIs -   GERD with significant Epigastric tenderness and poor appetite  sx have improved with Pepcid BID   UTI  Lactobacillus species w/ < 50, 000 colonies on original UA  - repeat UA markedly c/w candida UTI- on Diflucan   ESRD on HD  Nephrology is following - dialysis access (fistula) does not appear to be infected - Vasc Surgery has seen and agrees   Anemia of CKD  - no evidence of acute blood loss   Macrocytosis  B12 ok  CAD w/ Hx CABG  Asymptomatic   DM2  CBG reasonably controlled at the present time -   HTN  Cont to follow w/o change in tx plan today   Pulmonary HTN  S/P AoVR  Does not require chronic anticoagulation due to use of bioprosthetic valve - at risk for endocarditis   Moderate Protein Calorie malnutrition  encourage PO intake - Nepro shakes - Magic cup - allowing son to bring food from outside  - pt eats poorly when son is not here to encourage her   Code Status: NO CODE BLUE  Family Communication: no family present at time of exam today - have been communicating with son  Disposition Plan: likely will need SNF   Consultants:  Nephrology  ID  Vasc Surgery   Procedures:  TTE   Antibiotics:  Vanc oral 11/16 >>  Rifampin 11/18 >>  Gentamicin 11/18 >>  Nafcillin 11/22 >>1125  Ancef 11/25  Fluconazole 11/25  Ancef 11/18>>11/21  Rocephin 11/16 >>11/17  Flagyl 11/16  >>11/18   DVT prophylaxis:  SCDs    HPI/Subjective: Nose bleed resolved Patient states she is very tired  Objective: Filed Vitals:   12/02/12 1000  BP: 153/72  Pulse: 85  Temp: 98.1 F (36.7 C)  Resp: 18    Intake/Output Summary (Last 24 hours) at 12/02/12 1052 Last data filed at 12/02/12 0900  Gross per 24 hour  Intake 1345.66 ml  Output    625 ml  Net 720.66 ml   Filed Weights   11/28/12 0359 11/29/12 0400 11/30/12 2100  Weight: 54.8 kg (120 lb 13 oz) 54.8 kg (120 lb 13 oz) 52.6 kg (115 lb 15.4 oz)    Exam:  General: awake, alert and oriented x 3, No acute respiratory distress  Lungs: Clear to auscultation bilaterally without wheezes or crackles  Cardiovascular: Regular rate and rhythm without gallop or rub normal S1 and S2  Abdomen: Nontender, nondistended, soft, bowel sounds positive, no rebound, no ascites, no appreciable mass  Extremities: No significant cyanosis, clubbing, edema bilateral lower extremities   Data Reviewed: Basic Metabolic Panel:  Recent Labs Lab 11/26/12 0547 11/27/12 0355 11/28/12 0445 12/02/12 0610  NA 134* 132* 132* 130*  K 3.7 3.6 3.7 5.1  CL 96 95* 93* 89*  CO2 24 25  25 16*  GLUCOSE 147* 165* 199* 78  BUN 19 30* 18 37*  CREATININE 2.88* 3.84* 2.49* 5.45*  CALCIUM 8.8 8.5 8.2* 8.2*  PHOS  --  3.1 3.4  --    Liver Function Tests:  Recent Labs Lab 11/27/12 0355 11/28/12 0445  ALBUMIN 1.9* 1.8*   No results found for this basename: LIPASE, AMYLASE,  in the last 168 hours No results found for this basename: AMMONIA,  in the last 168 hours CBC:  Recent Labs Lab 11/27/12 0355 11/28/12 0445 11/29/12 1625 12/01/12 0533 12/02/12 0610  WBC 22.9* 21.6* 18.6* 18.7* 35.5*  HGB 10.1* 10.2* 10.1* 8.8* 9.2*  HCT 31.1* 31.4* 30.6* 27.0* 27.9*  MCV 103.7* 104.7* 103.0* 103.4* 103.0*  PLT 188 196 206 177 132*   Cardiac Enzymes: No results found for this basename: CKTOTAL, CKMB, CKMBINDEX, TROPONINI,  in the last 168  hours BNP (last 3 results) No results found for this basename: PROBNP,  in the last 8760 hours CBG:  Recent Labs Lab 12/01/12 0732 12/01/12 1140 12/01/12 1654 12/01/12 2215 12/02/12 0930  GLUCAP 109* 137* 122* 109* 103*    Recent Results (from the past 240 hour(s))  CULTURE, BLOOD (ROUTINE X 2)     Status: None   Collection Time    11/22/12 12:20 PM      Result Value Range Status   Specimen Description BLOOD RIGHT HAND   Final   Special Requests BOTTLES DRAWN AEROBIC AND ANAEROBIC 10CC   Final   Culture  Setup Time     Final   Value: 11/22/2012 18:09     Performed at [facility]   Culture     Final   Value: NO GROWTH 5 DAYS     Performed at [facility]   Report Status 11/28/2012 FINAL   Final  CULTURE, BLOOD (ROUTINE X 2)     Status: None   Collection Time    11/22/12 12:32 PM      Result Value Range Status   Specimen Description BLOOD RIGHT HAND   Final   Special Requests BOTTLES DRAWN AEROBIC AND ANAEROBIC 10CC   Final   Culture  Setup Time     Final   Value: 11/22/2012 18:10     Performed at [facility]   Culture     Final   Value: NO GROWTH 5 DAYS     Performed at [facility]   Report Status 11/28/2012 FINAL   Final  URINE CULTURE     Status: None   Collection Time    11/27/12  6:47 PM      Result Value Range Status   Specimen Description URINE, CATHETERIZED   Final   Special Requests NONE   Final   Culture  Setup Time     Final   Value: 11/28/2012 00:11     Performed at [facility] Count     Final   Value: >=100,000 COLONIES/ML     Performed at [facility]   Culture     Final   Value: YEAST     Performed at [facility]   Report Status 11/29/2012 FINAL   Final     Studies: No results found.  Scheduled Meds: . amLODipine  5 mg Oral QPM  . calcium carbonate  1 tablet Oral TID WC  .  ceFAZolin (ANCEF) IV  2 g Intravenous Q M,W,F-HD  . darbepoetin  25 mcg Intravenous Q Mon-HD  .  diphenhydrAMINE  12.5 mg Oral Once  . doxercalciferol  4 mcg Intravenous Q M,W,F-HD  . famotidine  20 mg Oral Daily  . feeding supplement (NEPRO CARB STEADY)  237 mL Oral TID BM  . fluconazole  100 mg Oral Daily  . folic acid  0.5 mg Oral Daily  . gentamicin  60 mg Intravenous Q M,W,F-HD  . insulin aspart  0-9 Units Subcutaneous TID WC  . levothyroxine  75 mcg Oral QAC breakfast  . metoprolol  75 mg Oral BID  . multivitamin  1 tablet Oral QHS  . rifampin  300 mg Oral TID  . sodium chloride  3 mL Intravenous Q12H  . vancomycin  500 mg Oral Q6H  . vitamin C  500 mg Oral Daily   Continuous Infusions: . sodium chloride 500 mL (11/28/12 1453)    Principal Problem:   Sepsis Active Problems:   Type II or unspecified type diabetes mellitus with renal manifestations, not stated as uncontrolled(250.40)   HYPERTENSION   PULMONARY HYPERTENSION   GERD   S/P aortic valve replacement   End stage renal disease   Diarrhea   UTI (lower urinary tract infection)   Leukocytosis, unspecified    Time spent: 35    [facility], Kermitt Harjo  [facility] Pager [phone]. If 7PM-7AM, please contact night-coverage at [email or web address], password [facility] 12/02/2012, 10:52 AM  LOS: 12 days

## 2012-12-02 NOTE — Progress Notes (Signed)
Hypoglycemic Event  CBG: 51  Treatment: D50 IV 25 mL  Symptoms: None  Follow-up CBG: Time:2252 CBG Result:135  Possible Reasons for Event: Inadequate meal intake  Comments/MD notified:Elray Mcgregor NP     Coralie Carpen  Remember to initiate Hypoglycemia Order Set & complete

## 2012-12-02 NOTE — Progress Notes (Signed)
Problem/Plan:  1. MSSA Bacteremia with HO AV Prothesis and neg veg on echo= ID following / AVF no signs of infection with 6 weeks ancef and rifampin stop day 01/03/13 And Gentamicin stop on dec 2 per ID 2. C . Difficle pos. = on po VANCO until 1/12 3. ESRD - MWF ( [facility]) On schedule now HD today.  Subjective: Interval History:  None  Objective: Vital signs in last 24 hours: Temp:  [97.6 F (36.4 C)-98.6 F (37 C)] 97.6 F (36.4 C) (11/28 0500) Pulse Rate:  [77-91] 86 (11/28 0500) Resp:  [16] 16 (11/28 0500) BP: (143-169)/(54-65) 148/61 mmHg (11/28 0500) SpO2:  [97 %-100 %] 98 % (11/28 0500) Weight change:   Intake/Output from previous day: 11/27 0701 - 11/28 0700 In: 1325.7 [P.O.:200; I.V.:1125.7] Out: 625 [Stool:625] Intake/Output this shift:    General appearance: alert and cooperative GI: soft, non-tender; bowel sounds normal; no masses,  no organomegaly Extremities: edema tr to 1  Lab Results:  Recent Labs  12/01/12 0533 12/02/12 0610  WBC 18.7* 35.5*  HGB 8.8* 9.2*  HCT 27.0* 27.9*  PLT 177 132*   BMET:  Recent Labs  12/02/12 0610  NA 130*  K 5.1  CL 89*  CO2 16*  GLUCOSE 78  BUN 37*  CREATININE 5.45*  CALCIUM 8.2*   No results found for this basename: PTH,  in the last 72 hours Iron Studies: No results found for this basename: IRON, TIBC, TRANSFERRIN, FERRITIN,  in the last 72 hours Studies/Results: No results found.  Scheduled: . amLODipine  5 mg Oral QPM  . calcium carbonate  1 tablet Oral TID WC  .  ceFAZolin (ANCEF) IV  2 g Intravenous Q M,W,F-HD  . darbepoetin  25 mcg Intravenous Q Mon-HD  . diphenhydrAMINE  12.5 mg Oral Once  . doxercalciferol  4 mcg Intravenous Q M,W,F-HD  . famotidine  20 mg Oral Daily  . feeding supplement (NEPRO CARB STEADY)  237 mL Oral TID BM  . fluconazole  100 mg Oral Daily  . folic acid  0.5 mg Oral Daily  . gentamicin  60 mg Intravenous Q M,W,F-HD  . insulin aspart  0-9 Units Subcutaneous TID WC  .  levothyroxine  75 mcg Oral QAC breakfast  . metoprolol  75 mg Oral BID  . multivitamin  1 tablet Oral QHS  . rifampin  300 mg Oral TID  . sodium chloride  3 mL Intravenous Q12H  . vancomycin  500 mg Oral Q6H  . vitamin C  500 mg Oral Daily     LOS: 12 days   Renad Jenniges C 12/02/2012,9:22 AM

## 2012-12-02 NOTE — Evaluation (Signed)
Physical Therapy Evaluation Patient Details Name: [name] MRN: [id number] DOB: 10-19-1920 Today's Date: 12/02/2012 Time: 4098-1191 PT Time Calculation (min): 20 min  PT Assessment / Plan / Recommendation History of Present Illness  pt is a 79 y.o. female adm secondary to n/v. Pt with ESRD (HD MWF), DM, CAD and hx of CDiff. Pt with recent episodes of falling. Pt found to have sepsis secondary to staph bacteriemia CDiff and fungal UTI. Pt with pulmonary HTN.   Clinical Impression  Pt adm secondary to above. Presents with decreased independence with mobility and transfers secondary to deficits listed below (see PT problem list). Pt to benefit from skilled PT in acute setting and from skilled PT in SNF setting to increase independence with mobility and address deficits listed below. Pt lives alone and was highly independent prior to admission. Pt wishes to return back home after post acute rehab.     PT Assessment  Patient needs continued PT services    Follow Up Recommendations  SNF;Supervision/Assistance - 24 hour    Does the patient have the potential to tolerate intense rehabilitation      Barriers to Discharge Decreased caregiver support;Inaccessible home environment      Equipment Recommendations  Other (comment) (TBD at SNF)    Recommendations for Other Services OT consult   Frequency Min 2X/week    Precautions / Restrictions Precautions Precautions: Fall Precaution Comments: reports she has a recent fall per her son Restrictions Weight Bearing Restrictions: No   Pertinent Vitals/Pain Did not c/o pain today. See VSS.      Mobility  Bed Mobility Bed Mobility: Supine to Sit;Sitting - Scoot to Edge of Bed Supine to Sit: 4: Min assist;HOB elevated;With rails Sitting - Scoot to Edge of Bed: 4: Min guard Details for Bed Mobility Assistance: (A) to bring trunk to sitting position at EOB; pt became frustrated that she was unable to come to sitting position at EOB  independently; max encouragement and cues for sequencing Transfers Transfers: Sit to Stand;Stand to Sit;Stand Pivot Transfers Sit to Stand: 2: Max assist;From bed;With upper extremity assist Stand to Sit: 2: Max assist;To chair/3-in-1;With upper extremity assist Stand Pivot Transfers: 2: Max assist;From elevated surface Details for Transfer Assistance: pt required max (A) to complete transfers secondary to generalized weakness; pt became anxious with transfers and required max cues and encouragement; bil LEs became shaky and seemed to want to buckle Ambulation/Gait Ambulation/Gait Assistance: Not tested (comment) Stairs: No Wheelchair Mobility Wheelchair Mobility: No    Exercises General Exercises - Lower Extremity Ankle Circles/Pumps: AROM;Both;10 reps;Supine   PT Diagnosis: Difficulty walking;Generalized weakness  PT Problem List: Decreased strength;Decreased activity tolerance;Decreased balance;Decreased mobility;Decreased cognition;Decreased knowledge of use of DME;Decreased safety awareness PT Treatment Interventions: DME instruction;Gait training;Functional mobility training;Therapeutic activities;Therapeutic exercise;Balance training;Neuromuscular re-education;Patient/family education     PT Goals(Current goals can be found in the care plan section) Acute Rehab PT Goals Patient Stated Goal: to go to [facility] PT Goal Formulation: With patient Time For Goal Achievement: 12/16/12 Potential to Achieve Goals: Good  Visit Information  Last PT Received On: 12/02/12 Assistance Needed: +2 (for ambulation) History of Present Illness: pt is a 79 y.o. female adm secondary to n/v.        Prior Functioning  Home Living Family/patient expects to be discharged to:: Private residence Living Arrangements: Alone Available Help at Discharge: Family;Available PRN/intermittently Type of Home: House Home Layout: Two level Home Equipment: Walker - 2 wheels;Cane - single  point;Wheelchair - manual Additional Comments: Pt reports she  was able to ambulate down to basement and back up to main floor with hand rails  Prior Function Level of Independence: Independent Comments: per pt, PTA pt was independent and did not require any AD for mobility Communication Communication: No difficulties Dominant Hand: Right    Cognition  Cognition Arousal/Alertness: Awake/alert Behavior During Therapy: Flat affect Overall Cognitive Status: Impaired/Different from baseline Area of Impairment: Orientation;Memory;Following commands;Safety/judgement;Problem solving Orientation Level: Disoriented to;Time;Situation Memory: Decreased short-term memory Following Commands: Follows one step commands with increased time;Follows one step commands consistently Safety/Judgement: Decreased awareness of deficits;Decreased awareness of safety Problem Solving: Decreased initiation;Slow processing;Difficulty sequencing;Requires verbal cues;Requires tactile cues General Comments: no family member present to determine baseline     Extremity/Trunk Assessment Upper Extremity Assessment Upper Extremity Assessment: Defer to OT evaluation Lower Extremity Assessment Lower Extremity Assessment: Generalized weakness Cervical / Trunk Assessment Cervical / Trunk Assessment: Normal   Balance Balance Balance Assessed: Yes Static Sitting Balance Static Sitting - Balance Support: Feet unsupported;Bilateral upper extremity supported Static Sitting - Level of Assistance: 5: Stand by assistance Static Sitting - Comment/# of Minutes: pt with fwd flex trunk sitting EOB; tolerated ~4 min prior to transfer; no LOB noted   End of Session PT - End of Session Equipment Utilized During Treatment: Gait belt Activity Tolerance: Patient tolerated treatment well Patient left: in chair;with call bell/phone within reach;with nursing/sitter in room Nurse Communication: Mobility status  GP     Donell Sievert, [place]  [phone] 12/02/2012, 9:51 AM

## 2012-12-03 DIAGNOSIS — R7881 Bacteremia: Secondary | ICD-10-CM

## 2012-12-03 LAB — CBC
Hemoglobin: 9.1 g/dL — ABNORMAL LOW (ref 12.0–15.0)
MCH: 33.8 pg (ref 26.0–34.0)
MCV: 103 fL — ABNORMAL HIGH (ref 78.0–100.0)
Platelets: 192 10*3/uL (ref 150–400)
RBC: 2.69 MIL/uL — ABNORMAL LOW (ref 3.87–5.11)
RDW: 16.5 % — ABNORMAL HIGH (ref 11.5–15.5)
WBC: 20.5 10*3/uL — ABNORMAL HIGH (ref 4.0–10.5)

## 2012-12-03 LAB — GLUCOSE, CAPILLARY: Glucose-Capillary: 151 mg/dL — ABNORMAL HIGH (ref 70–99)

## 2012-12-03 LAB — BASIC METABOLIC PANEL
CO2: 27 mEq/L (ref 19–32)
Calcium: 8.3 mg/dL — ABNORMAL LOW (ref 8.4–10.5)
Chloride: 95 mEq/L — ABNORMAL LOW (ref 96–112)
Sodium: 137 mEq/L (ref 135–145)

## 2012-12-03 NOTE — Progress Notes (Signed)
[facility] Progress Note  Subjective:   No c/o  Objective Filed Vitals:   12/02/12 1930 12/02/12 1936 12/02/12 2236 12/03/12 0636  BP: 158/64 150/78 158/45 137/46  Pulse: 78 80 97 84  Temp:  97.8 F (36.6 C) 98.4 F (36.9 C) 98 F (36.7 C)  TempSrc:  Oral Oral Oral  Resp: 16 18 18 17   Height:      Weight:  50.5 kg (111 lb 5.3 oz) 50.1 kg (110 lb 7.2 oz)   SpO2:  94% 99% 98%   Physical Exam General: resting in bed, quiet neck veins prominent Heart: RRR Lungs: diminished BS Abdomen: soft Extremities: tr LE edema Dialysis Access: left AVF patent  Dialysis orders: MWF at [facility]  3hrs 50.5kg 2K/2.25Ca 400/800 L arm AVF Heparin 4200  Hectorol 4ug Epo 1800 Venofer none  Assessment/Plan: 1. MSSA bacteremia - with hx AV prosthesis, neg veg on Echo; AVF no signs of infx; per ID 5 weeks Ancef and rifampin stop day 12/30; on gent through 12/2 per ID 2. ESRD - MWF [facility] K 3.3 today after 2 K bath during HD yesterday for K of 5.5  3. Anemia - Hgb 9.1 Aranesp 25; folate level >20 on folic acid- check Fe studies with next labs 4. Secondary hyperparathyroidism - hectorol 4; P 10.7 11/28 - up for 3's doesn't make sense - recheck with next lab - continue same binders 5. HTN/volume - UF of 2.5 Lyesterday to a post wt of 50.1; on norvasc 5 and metoprolol 75 bid; needs edw lowered further 6. Nutrition -  Alb 1.9 renal diet + vit; allowing son to bring in food; change to regular diet 7. C diff + on po Vanc until 1/12; avoid PPI 8. Yeast UTI - on diflucan  Sheffield Slider, PA-C [facility] Beeper [phone] 12/03/2012,9:52 AM  LOS: 13 days   Renal Attending: Confusing phos labs. Diarrhea improved.  Hopefully at a turning point.  ACPowell, MD   Additional Objective Labs: Basic Metabolic Panel:  Recent Labs Lab 11/27/12 0355 11/28/12 0445 12/02/12 0610 12/02/12 1610 12/03/12 0725  NA 132* 132* 130* 129* 137  K 3.6 3.7 5.1 5.5* 3.3*  CL 95* 93*  89* 86* 95*  CO2 25 25 16* 13* 27  GLUCOSE 165* 199* 78 97 134*  BUN 30* 18 37* 43* 18  CREATININE 3.84* 2.49* 5.45* 5.88* 3.14*  CALCIUM 8.5 8.2* 8.2* 8.0* 8.3*  PHOS 3.1 3.4  --  10.7*  --    Liver Function Tests:  Recent Labs Lab 11/27/12 0355 11/28/12 0445 12/02/12 1610  ALBUMIN 1.9* 1.8* 1.9*  CBC:  Recent Labs Lab 11/28/12 0445 11/29/12 1625 12/01/12 0533 12/02/12 0610 12/03/12 0725  WBC 21.6* 18.6* 18.7* 35.5* 20.5*  HGB 10.2* 10.1* 8.8* 9.2* 9.1*  HCT 31.4* 30.6* 27.0* 27.9* 27.7*  MCV 104.7* 103.0* 103.4* 103.0* 103.0*  PLT 196 206 177 132* 192   Blood Culture    Component Value Date/Time   SDES URINE, CATHETERIZED 11/27/2012 1847   SPECREQUEST NONE 11/27/2012 1847   CULT  Value: YEAST Performed at [facility] 11/27/2012 1847   REPTSTATUS 11/29/2012 FINAL 11/27/2012 1847   CBG:  Recent Labs Lab 12/02/12 0930 12/02/12 1211 12/02/12 2233 12/02/12 2254 12/03/12 0755  GLUCAP 103* 147* 51* 135* 112*  Medications: . sodium chloride 500 mL (11/28/12 1453)   . amLODipine  5 mg Oral QPM  . calcium carbonate  1 tablet Oral TID WC  .  ceFAZolin (ANCEF) IV  2 g Intravenous Q M,W,F-HD  . darbepoetin  25 mcg Intravenous Q Mon-HD  . diphenhydrAMINE  12.5 mg Oral Once  . doxercalciferol  4 mcg Intravenous Q M,W,F-HD  . famotidine  20 mg Oral Daily  . feeding supplement (NEPRO CARB STEADY)  237 mL Oral TID BM  . fluconazole  100 mg Oral Daily  . folic acid  0.5 mg Oral Daily  . gentamicin  60 mg Intravenous Q M,W,F-HD  . insulin aspart  0-9 Units Subcutaneous TID WC  . levothyroxine  75 mcg Oral QAC breakfast  . metoprolol  75 mg Oral BID  . multivitamin  1 tablet Oral QHS  . rifampin  300 mg Oral TID  . sodium chloride  3 mL Intravenous Q12H  . vancomycin  500 mg Oral Q6H  . vitamin C  500 mg Oral Daily

## 2012-12-03 NOTE — Progress Notes (Signed)
[facility] PROGRESS NOTE  [name] ZOX:[id number] DOB: July 30, 1920 DOA: 11/20/2012 PCP: Tillman Abide, MD  Assessment/Plan: Sepsis  due to Staph bacteremia C diff and now fungal UTI   Leukocytosis -continue to monitor  MRSA bacteremia - 2/2 blood cx +  - TTE w/o overt vegetations  - ID and cardio have decided that there is not a dire need for TEE in this frail lady.  - plan for treatment is :  - Ancef for 6 wks with HD through 12/30  - Gent to be given through 12/06/12  - Rifampin to stop on 01/03/13   C diff colitis  Oral vanc -- stop date 01/17/12  - avoid PPIs -   GERD with significant Epigastric tenderness and poor appetite  sx have improved with Pepcid BID   UTI  Lactobacillus species w/ < 50, 000 colonies on original UA  - repeat UA markedly c/w candida UTI- on Diflucan   ESRD on HD  Nephrology is following - dialysis access (fistula) does not appear to be infected - Vasc Surgery has seen and agrees   Anemia of CKD  - no evidence of acute blood loss   Macrocytosis  B12 ok  CAD w/ Hx CABG  Asymptomatic   DM2  CBG reasonably controlled at the present time -   HTN  Cont to follow w/o change in tx plan today   Pulmonary HTN  S/P AoVR  Does not require chronic anticoagulation due to use of bioprosthetic valve - at risk for endocarditis   Moderate Protein Calorie malnutrition  encourage PO intake - Nepro shakes - Magic cup - allowing son to bring food from outside  - pt eats poorly when son is not here to encourage her   Code Status: NO CODE BLUE  Family Communication: no family present at time of exam today - have been communicating with son  Disposition Plan: likely will need SNF   Consultants:  Nephrology  ID  Vasc Surgery   Procedures:  TTE   Antibiotics:  Vanc oral 11/16 >>  Rifampin 11/18 >>  Gentamicin 11/18 >>  Nafcillin 11/22 >>1125  Ancef 11/25  Fluconazole 11/25  Ancef 11/18>>11/21  Rocephin 11/16 >>11/17  Flagyl 11/16  >>11/18   DVT prophylaxis:  SCDs    HPI/Subjective: Ate better this AM for breakfast Not as tired  Objective: Filed Vitals:   12/03/12 0636  BP: 137/46  Pulse: 84  Temp: 98 F (36.7 C)  Resp: 17    Intake/Output Summary (Last 24 hours) at 12/03/12 0917 Last data filed at 12/03/12 0656  Gross per 24 hour  Intake 541.67 ml  Output   2500 ml  Net -1958.33 ml   Filed Weights   12/02/12 1545 12/02/12 1936 12/02/12 2236  Weight: 53 kg (116 lb 13.5 oz) 50.5 kg (111 lb 5.3 oz) 50.1 kg (110 lb 7.2 oz)    Exam:  General: awake, alert and oriented x 3, No acute respiratory distress  Lungs: Clear to auscultation bilaterally without wheezes or crackles  Cardiovascular: Regular rate and rhythm without gallop or rub normal S1 and S2  Abdomen: Nontender, nondistended, soft, bowel sounds positive, no rebound, no ascites, no appreciable mass  Extremities: No significant cyanosis, clubbing, edema bilateral lower extremities   Data Reviewed: Basic Metabolic Panel:  Recent Labs Lab 11/27/12 0355 11/28/12 0445 12/02/12 0610 12/02/12 1610 12/03/12 0725  NA 132* 132* 130* 129* 137  K 3.6 3.7 5.1 5.5* 3.3*  CL 95* 93* 89* 86* 95*  CO2 25 25 16* 13* 27  GLUCOSE 165* 199* 78 97 134*  BUN 30* 18 37* 43* 18  CREATININE 3.84* 2.49* 5.45* 5.88* 3.14*  CALCIUM 8.5 8.2* 8.2* 8.0* 8.3*  PHOS 3.1 3.4  --  10.7*  --    Liver Function Tests:  Recent Labs Lab 11/27/12 0355 11/28/12 0445 12/02/12 1610  ALBUMIN 1.9* 1.8* 1.9*   No results found for this basename: LIPASE, AMYLASE,  in the last 168 hours No results found for this basename: AMMONIA,  in the last 168 hours CBC:  Recent Labs Lab 11/28/12 0445 11/29/12 1625 12/01/12 0533 12/02/12 0610 12/03/12 0725  WBC 21.6* 18.6* 18.7* 35.5* 20.5*  HGB 10.2* 10.1* 8.8* 9.2* 9.1*  HCT 31.4* 30.6* 27.0* 27.9* 27.7*  MCV 104.7* 103.0* 103.4* 103.0* 103.0*  PLT 196 206 177 132* 192   Cardiac Enzymes: No results found for this  basename: CKTOTAL, CKMB, CKMBINDEX, TROPONINI,  in the last 168 hours BNP (last 3 results) No results found for this basename: PROBNP,  in the last 8760 hours CBG:  Recent Labs Lab 12/01/12 2215 12/02/12 0930 12/02/12 1211 12/02/12 2233 12/02/12 2254  GLUCAP 109* 103* 147* 51* 135*    Recent Results (from the past 240 hour(s))  URINE CULTURE     Status: None   Collection Time    11/27/12  6:47 PM      Result Value Range Status   Specimen Description URINE, CATHETERIZED   Final   Special Requests NONE   Final   Culture  Setup Time     Final   Value: 11/28/2012 00:11     Performed at [facility] Count     Final   Value: >=100,000 COLONIES/ML     Performed at [facility]   Culture     Final   Value: YEAST     Performed at [facility]   Report Status 11/29/2012 FINAL   Final     Studies: No results found.  Scheduled Meds: . amLODipine  5 mg Oral QPM  . calcium carbonate  1 tablet Oral TID WC  .  ceFAZolin (ANCEF) IV  2 g Intravenous Q M,W,F-HD  . darbepoetin  25 mcg Intravenous Q Mon-HD  . diphenhydrAMINE  12.5 mg Oral Once  . doxercalciferol  4 mcg Intravenous Q M,W,F-HD  . famotidine  20 mg Oral Daily  . feeding supplement (NEPRO CARB STEADY)  237 mL Oral TID BM  . fluconazole  100 mg Oral Daily  . folic acid  0.5 mg Oral Daily  . gentamicin  60 mg Intravenous Q M,W,F-HD  . insulin aspart  0-9 Units Subcutaneous TID WC  . levothyroxine  75 mcg Oral QAC breakfast  . metoprolol  75 mg Oral BID  . multivitamin  1 tablet Oral QHS  . rifampin  300 mg Oral TID  . sodium chloride  3 mL Intravenous Q12H  . vancomycin  500 mg Oral Q6H  . vitamin C  500 mg Oral Daily   Continuous Infusions: . sodium chloride 500 mL (11/28/12 1453)    Principal Problem:   Sepsis Active Problems:   Type II or unspecified type diabetes mellitus with renal manifestations, not stated as uncontrolled(250.40)   HYPERTENSION   PULMONARY HYPERTENSION    GERD   S/P aortic valve replacement   End stage renal disease   Diarrhea   UTI (lower urinary tract infection)   Leukocytosis, unspecified    Time spent: 35  Marlin Canary  [facility] Pager [phone]. If 7PM-7AM, please contact night-coverage at [email or web address], password [facility] 12/03/2012, 9:17 AM  LOS: 13 days

## 2012-12-03 NOTE — Progress Notes (Signed)
ANTIBIOTIC CONSULT NOTE - FOLLOW UP  Pharmacy Consult for Ancef/Gent Indication: MSSA bacteremia +/- endocarditis  Allergies  Allergen Reactions  . Captopril     REACTION: unspecified  . Enalapril Maleate     REACTION: cough  . Nitrofurantoin     REACTION: itching  . Ramipril     REACTION: unspecified  . Sulfa Antibiotics Other (See Comments)    Reaction unknown  . Verapamil     REACTION: unspecified    Patient Measurements: Height: 5\' 2"  (157.5 cm) Weight: 110 lb 7.2 oz (50.1 kg) IBW/kg (Calculated) : 50.1 Adjusted Body Weight:   Vital Signs: Temp: 98.6 F (37 C) (11/29 1030) Temp src: Oral (11/29 0636) BP: 151/64 mmHg (11/29 1030) Pulse Rate: 84 (11/29 1030) Intake/Output from previous day: 11/28 0701 - 11/29 0700 In: 661.7 [P.O.:180; I.V.:331.7; IV Piggyback:150] Out: 2500  Intake/Output from this shift: Total I/O In: 120 [P.O.:120] Out: -   Labs:  Recent Labs  12/01/12 0533 12/02/12 0610 12/02/12 1610 12/03/12 0725  WBC 18.7* 35.5*  --  20.5*  HGB 8.8* 9.2*  --  9.1*  PLT 177 132*  --  192  CREATININE  --  5.45* 5.88* 3.14*   Estimated Creatinine Clearance: 9.4 ml/min (by C-G formula based on Cr of 3.14). No results found for this basename: VANCOTROUGH, Leodis Binet, VANCORANDOM, GENTTROUGH, GENTPEAK, GENTRANDOM, TOBRATROUGH, TOBRAPEAK, TOBRARND, AMIKACINPEAK, AMIKACINTROU, AMIKACIN,  in the last 72 hours   Microbiology: Recent Results (from the past 720 hour(s))  CULTURE, BLOOD (ROUTINE X 2)     Status: None   Collection Time    11/20/12  1:39 PM      Result Value Range Status   Specimen Description BLOOD RIGHT ARM   Final   Special Requests BOTTLES DRAWN AEROBIC AND ANAEROBIC 4CC   Final   Culture  Setup Time     Final   Value: 11/20/2012 17:58     Performed at [facility]   Culture     Final   Value: STAPHYLOCOCCUS AUREUS     Note: RIFAMPIN AND GENTAMICIN SHOULD NOT BE USED AS SINGLE DRUGS FOR TREATMENT OF STAPH INFECTIONS.      Note: Gram Stain Report Called to,Read Back By and Verified With: Bonnee Quin RN on 11/21/12 at 03:10 by Christie Nottingham     Performed at [facility]   Report Status 11/23/2012 FINAL   Final   Organism ID, Bacteria STAPHYLOCOCCUS AUREUS   Final  CULTURE, BLOOD (ROUTINE X 2)     Status: None   Collection Time    11/20/12  2:19 PM      Result Value Range Status   Specimen Description BLOOD RIGHT HAND   Final   Special Requests BOTTLES DRAWN AEROBIC AND ANAEROBIC 10CC   Final   Culture  Setup Time     Final   Value: 11/20/2012 17:58     Performed at [facility]   Culture     Final   Value: STAPHYLOCOCCUS AUREUS     Note: SUSCEPTIBILITIES PERFORMED ON PREVIOUS CULTURE WITHIN THE LAST 5 DAYS.     Note: Gram Stain Report Called to,Read Back By and Verified With: Bonnee Quin RN on 11/21/12 at 03:10 by Christie Nottingham     Performed at [facility]   Report Status 11/23/2012 FINAL   Final  URINE CULTURE     Status: None   Collection Time    11/20/12  3:05 PM      Result Value Range  Status   Specimen Description URINE, CATHETERIZED   Final   Special Requests NONE   Final   Culture  Setup Time     Final   Value: 11/20/2012 20:00     Performed at [facility]   Culture     Final   Value: 50,000 COLONIES/mL LACTOBACILLUS SPECIES     Note: Standardized susceptibility testing for this organism is not available.     Performed at [facility]   Report Status 11/21/2012 FINAL   Final  MRSA PCR SCREENING     Status: Abnormal   Collection Time    11/20/12  5:52 PM      Result Value Range Status   MRSA by PCR POSITIVE (*) NEGATIVE Final   Comment:            The GeneXpert MRSA Assay (FDA     approved for NASAL specimens     only), is one component of a     comprehensive MRSA colonization     surveillance program. It is not     intended to diagnose MRSA     infection nor to guide or     monitor treatment for     MRSA infections.     RESULT CALLED TO, READ  BACK BY AND VERIFIED WITH:     GRACOU,R RN 1949 11/20/12 MITCHELL,L  CLOSTRIDIUM DIFFICILE BY PCR     Status: Abnormal   Collection Time    11/20/12  8:27 PM      Result Value Range Status   C difficile by pcr POSITIVE (*) NEGATIVE Final   Comment: CRITICAL RESULT CALLED TO, READ BACK BY AND VERIFIED WITH:     PETTIFORD A RN 11/21/12 0805 COSTELLO B  CULTURE, BLOOD (ROUTINE X 2)     Status: None   Collection Time    11/22/12 12:20 PM      Result Value Range Status   Specimen Description BLOOD RIGHT HAND   Final   Special Requests BOTTLES DRAWN AEROBIC AND ANAEROBIC 10CC   Final   Culture  Setup Time     Final   Value: 11/22/2012 18:09     Performed at [facility]   Culture     Final   Value: NO GROWTH 5 DAYS     Performed at [facility]   Report Status 11/28/2012 FINAL   Final  CULTURE, BLOOD (ROUTINE X 2)     Status: None   Collection Time    11/22/12 12:32 PM      Result Value Range Status   Specimen Description BLOOD RIGHT HAND   Final   Special Requests BOTTLES DRAWN AEROBIC AND ANAEROBIC 10CC   Final   Culture  Setup Time     Final   Value: 11/22/2012 18:10     Performed at [facility]   Culture     Final   Value: NO GROWTH 5 DAYS     Performed at [facility]   Report Status 11/28/2012 FINAL   Final  URINE CULTURE     Status: None   Collection Time    11/27/12  6:47 PM      Result Value Range Status   Specimen Description URINE, CATHETERIZED   Final   Special Requests NONE   Final   Culture  Setup Time     Final   Value: 11/28/2012 00:11     Performed at [facility] Count  Final   Value: >=100,000 COLONIES/ML     Performed at [facility]   Culture     Final   Value: YEAST     Performed at [facility]   Report Status 11/29/2012 FINAL   Final    Anti-infectives   Start     Dose/Rate Route Frequency Ordered Stop   11/30/12 1600  gentamicin (GARAMYCIN) IVPB 60 mg     60 mg 100 mL/hr  over 30 Minutes Intravenous Every M-W-F (Hemodialysis) 11/28/12 0950     11/30/12 1200  ceFAZolin (ANCEF) IVPB 2 g/50 mL premix     2 g 100 mL/hr over 30 Minutes Intravenous Every M-W-F (Hemodialysis) 11/29/12 1315     11/29/12 1930  fluconazole (DIFLUCAN) tablet 100 mg     100 mg Oral Daily 11/29/12 1827 12/09/12 0959   11/29/12 1830  fluconazole (DIFLUCAN) tablet 100 mg  Status:  Discontinued     100 mg Oral Daily 11/29/12 1827 11/29/12 1827   11/29/12 1730  rifampin (RIFADIN) capsule 300 mg     300 mg Oral 3 times daily 11/29/12 1546     11/29/12 1600  gentamicin (GARAMYCIN) IVPB 60 mg  Status:  Discontinued     60 mg 100 mL/hr over 30 Minutes Intravenous Every Hemodialysis 11/28/12 0952 12/02/12 1206   11/29/12 1400  ceFAZolin (ANCEF) IVPB 2 g/50 mL premix     2 g 100 mL/hr over 30 Minutes Intravenous  Once 11/29/12 1315 11/29/12 1505   11/26/12 1800  nafcillin 2 g in dextrose 5 % 50 mL IVPB  Status:  Discontinued     2 g 100 mL/hr over 30 Minutes Intravenous 6 times per day 11/26/12 1710 11/29/12 1222   11/23/12 2000  ceFAZolin (ANCEF) IVPB 1 g/50 mL premix  Status:  Discontinued     1 g 100 mL/hr over 30 Minutes Intravenous Every M-W-F (2000) 11/22/12 1138 11/22/12 1139   11/23/12 2000  ceFAZolin (ANCEF) IVPB 2 g/50 mL premix  Status:  Discontinued     2 g 100 mL/hr over 30 Minutes Intravenous Every M-W-F (2000) 11/22/12 1139 11/26/12 1710   11/23/12 1600  gentamicin (GARAMYCIN) IVPB 60 mg  Status:  Discontinued     60 mg 100 mL/hr over 30 Minutes Intravenous Every M-W-F (Hemodialysis) 11/22/12 1526 11/28/12 0950   11/22/12 1530  gentamicin (GARAMYCIN) IVPB 80 mg     80 mg 100 mL/hr over 30 Minutes Intravenous NOW 11/22/12 1524 11/22/12 1655   11/22/12 1500  rifampin (RIFADIN) 300 mg in sodium chloride 0.9 % 100 mL IVPB  Status:  Discontinued     300 mg 200 mL/hr over 30 Minutes Intravenous 3 times per day 11/22/12 1359 11/29/12 1546   11/22/12 1200  vancomycin (VANCOCIN) IVPB  1000 mg/200 mL premix     1,000 mg 200 mL/hr over 60 Minutes Intravenous STAT 11/22/12 1114 11/22/12 1347   11/22/12 1200  ceFAZolin (ANCEF) IVPB 2 g/50 mL premix     2 g 100 mL/hr over 30 Minutes Intravenous STAT 11/22/12 1138 11/22/12 1316   11/21/12 1200  vancomycin (VANCOCIN) 500 mg in sodium chloride 0.9 % 100 mL IVPB  Status:  Discontinued     500 mg 100 mL/hr over 60 Minutes Intravenous Every M-W-F (Hemodialysis) 11/21/12 0928 11/23/12 0909   11/21/12 0930  vancomycin (VANCOCIN) 1,250 mg in sodium chloride 0.9 % 250 mL IVPB     1,250 mg 166.7 mL/hr over 90 Minutes Intravenous STAT 11/21/12 0927 11/22/12 0930  11/20/12 1800  cefTRIAXone (ROCEPHIN) 1 g in dextrose 5 % 50 mL IVPB  Status:  Discontinued     1 g 100 mL/hr over 30 Minutes Intravenous Every 24 hours 11/20/12 1756 11/22/12 1120   11/20/12 1800  vancomycin (VANCOCIN) 50 mg/mL oral solution 500 mg     500 mg Oral 4 times per day 11/20/12 1756 12/04/12 1759   11/20/12 1756  metroNIDAZOLE (FLAGYL) IVPB 500 mg  Status:  Discontinued     500 mg 100 mL/hr over 60 Minutes Intravenous Every 8 hours 11/20/12 1756 11/22/12 1400   11/20/12 1545  metroNIDAZOLE (FLAGYL) IVPB 500 mg     500 mg 100 mL/hr over 60 Minutes Intravenous  Once 11/20/12 1535 11/20/12 1725   11/20/12 1545  cefTRIAXone (ROCEPHIN) 1 g in dextrose 5 % 50 mL IVPB     1 g 100 mL/hr over 30 Minutes Intravenous  Once 11/20/12 1538 11/20/12 1724      Assessment: 79 y/o F with ESRD, DM , CAD and prior C.Diff admitted w/ V&D, LA 11.9, anion gap 24, glucose 206, WBC 13.2 w/L shift. Started of CTX, Flagyl  PMH: anemia, GERD, HLD, OA, OP, pulm HTN, anxiety, CKD, AS, CAD, HTN, migraine, CHF, DM, hypothyroid  AC: None PTA, SCDs for VTE proph. Hgb 9.1 stable. 11/27: Pt with 2 episodes of epistaxis, clotted, bright red. Pt is also coughing up clotted bright red blood (appears to be from nose)--d/c ASA  ID: MSSA Bacteremia with HO AV Prothesis and neg veg on  echo. Gent/rifampin D#12+ Cefazolin D#5/42 for MSSA bacteremia & r/o bioprosthetic endocarditis + vanc PO D#13 for Cdiff - Will need 2 weeks gent, 6 weeks rifampin + nafcillin, also vanc PO x 2 weeks past last abx ( end 01/17/12) - Afebrile, WBC 18.6 >35.5>20.5 today! Echo neg for vegetation - MWF HD . Appears pt missed gent with HD 11/25 (or at least not on [facility]). Gent removal ~ 50% w/ HD, pre-HD 1.6 ok with missing a dose (for synergy)goal < 1). Fluconazole for yeast UTI  Vanc 11/18 >> 11/19 CTX 11/17 >> 11/18 Flagyl IV 11/17 >>11/20 Nafcillin 11/22>>11/25 Fluconazole 11/25>>12/5 Gent 11/18>> (end 12/2) --11/25 Gent level = 1.6 Cefazolin 11/25 (42d)>> Rifampin 11/18 (end 42d) >>  Vanc po 11/17 >>(3nd 01/16/13)  11/16 UCx: 50k lactobacillus colonization 11/16 BCx: MSSA 11/18 BCx: NEG 11/16 MRA - POS 11/16 Cdiff - POS 11/23 Urine - >100K yeast  CV: Hx DL/CAD(CABG/AVR(bioprosthetic) '09)/HTN/PVD/CHF. EF 55-60% - 151/64, HR 84 amlodipine 5mg , metoprolol  Endo: Hx DM (A1c 6.2), hypothyroidism on Synthroid - CBGs 51-147  GI/Nutr: Hx GERD and admitted with diarrhea -- found CDiff +, LFTs elevated (123/69) on 11/18. Meds: FA, vit C, multivit, Neprocarb PRN, Pepcid Hx anxiety - no meds  Renal: ESRD-MWF. On hectorol, Tums, Aranexp/Monday. Hgb 9.1  Pulm: Hx pulm HTN  Heme/Onc: Hx anemia - HGB 9.1 - aranesp. B12 for macrocytosis   PLAN:  - Continue vanc PO/rifampin as ordered - Continue gent 60mg  IV QHD (MWF)--no level, should end Tuesday. - Cefazolin 2gm MWF  Merilynn Finland, [employer] 12/03/2012,11:58 AM

## 2012-12-04 LAB — GLUCOSE, CAPILLARY
Glucose-Capillary: 194 mg/dL — ABNORMAL HIGH (ref 70–99)
Glucose-Capillary: 148 mg/dL — ABNORMAL HIGH (ref 70–99)
Glucose-Capillary: 159 mg/dL — ABNORMAL HIGH (ref 70–99)
Glucose-Capillary: 123 mg/dL — ABNORMAL HIGH (ref 70–99)

## 2012-12-04 LAB — RENAL FUNCTION PANEL
Albumin: 1.8 g/dL — ABNORMAL LOW (ref 3.5–5.2)
BUN: 24 mg/dL — ABNORMAL HIGH (ref 6–23)
CO2: 23 mEq/L (ref 19–32)
Calcium: 8.6 mg/dL (ref 8.4–10.5)
Chloride: 91 mEq/L — ABNORMAL LOW (ref 96–112)
Creatinine, Ser: 4.5 mg/dL — ABNORMAL HIGH (ref 0.50–1.10)
GFR calc Af Amer: 9 mL/min — ABNORMAL LOW (ref >90)
GFR calc non Af Amer: 8 mL/min — ABNORMAL LOW (ref >90)
Glucose, Bld: 132 mg/dL — ABNORMAL HIGH (ref 70–99)
Phosphorus: 5.5 mg/dL — ABNORMAL HIGH (ref 2.3–4.6)
Potassium: 3.2 mEq/L — ABNORMAL LOW (ref 3.5–5.1)
Sodium: 133 mEq/L — ABNORMAL LOW (ref 135–145)

## 2012-12-04 LAB — CBC
HCT: 24.8 % — ABNORMAL LOW (ref 36.0–46.0)
Hemoglobin: 8.2 g/dL — ABNORMAL LOW (ref 12.0–15.0)
MCH: 33.6 pg (ref 26.0–34.0)
MCHC: 33.1 g/dL (ref 30.0–36.0)
MCV: 101.6 fL — ABNORMAL HIGH (ref 78.0–100.0)
Platelets: 156 10*3/uL (ref 150–400)
RBC: 2.44 MIL/uL — ABNORMAL LOW (ref 3.87–5.11)
RDW: 16.5 % — ABNORMAL HIGH (ref 11.5–15.5)
WBC: 13.8 10*3/uL — ABNORMAL HIGH (ref 4.0–10.5)

## 2012-12-04 LAB — IRON AND TIBC
Iron: 147 ug/dL — ABNORMAL HIGH (ref 42–135)
UIBC: <15 ug/dL — ABNORMAL LOW (ref 125–400)

## 2012-12-04 LAB — FERRITIN: Ferritin: >16500 ng/mL — ABNORMAL HIGH (ref 10–291)

## 2012-12-04 NOTE — Progress Notes (Signed)
[facility] PROGRESS NOTE  [name] WUJ:[id number] DOB: 1920/06/17 DOA: 11/20/2012 PCP: Tillman Abide, MD  Assessment/Plan: Sepsis  due to Staph bacteremia C diff and now fungal UTI   Leukocytosis -continue to monitor  MRSA bacteremia - 2/2 blood cx +  - TTE w/o overt vegetations  - ID and cardio have decided that there is not a dire need for TEE in this frail lady.  - plan for treatment is :  - Ancef for 6 wks with HD through 12/30  - Gent to be given through 12/06/12  - Rifampin to stop on 01/03/13   C diff colitis  Oral vanc -- stop date 01/17/12  - avoid PPIs -   GERD with significant Epigastric tenderness and poor appetite  sx have improved with Pepcid BID   UTI  Lactobacillus species w/ < 50, 000 colonies on original UA  - repeat UA markedly c/w candida UTI- on Diflucan   ESRD on HD  Nephrology is following - dialysis access (fistula) does not appear to be infected - Vasc Surgery has seen and agrees   Anemia of CKD  - no evidence of acute blood loss   Macrocytosis  B12 ok  CAD w/ Hx CABG  Asymptomatic   DM2  CBG reasonably controlled at the present time -   HTN  Cont to follow w/o change in tx plan today   Pulmonary HTN  S/P AoVR  Does not require chronic anticoagulation due to use of bioprosthetic valve - at risk for endocarditis   Moderate Protein Calorie malnutrition  encourage PO intake - Nepro shakes - Magic cup - allowing son to bring food from outside  - pt eats poorly when son is not here to encourage her   Code Status: NO CODE BLUE  Family Communication:   son  Disposition Plan:  need SNF   Consultants:  Nephrology  ID  Vasc Surgery   Procedures:  TTE   Antibiotics:  Vanc oral 11/16 >>  Rifampin 11/18 >>  Gentamicin 11/18 >>  Nafcillin 11/22 >>1125  Ancef 11/25  Fluconazole 11/25  Ancef 11/18>>11/21  Rocephin 11/16 >>11/17  Flagyl 11/16 >>11/18   DVT prophylaxis:  SCDs    HPI/Subjective: Ate better this AM  for breakfast Not as tired  Objective: Filed Vitals:   12/04/12 1015  BP: 156/53  Pulse: 83  Temp: 98 F (36.7 C)  Resp: 17    Intake/Output Summary (Last 24 hours) at 12/04/12 1047 Last data filed at 12/04/12 1015  Gross per 24 hour  Intake    283 ml  Output      0 ml  Net    283 ml   Filed Weights   12/02/12 1545 12/02/12 1936 12/02/12 2236  Weight: 53 kg (116 lb 13.5 oz) 50.5 kg (111 lb 5.3 oz) 50.1 kg (110 lb 7.2 oz)    Exam:  General: awake, alert and oriented x 3, No acute respiratory distress  Lungs: Clear to auscultation bilaterally without wheezes or crackles  Cardiovascular: Regular rate and rhythm without gallop or rub normal S1 and S2  Abdomen: Nontender, nondistended, soft, bowel sounds positive, no rebound, no ascites, no appreciable mass  Extremities: No significant cyanosis, clubbing, edema bilateral lower extremities   Data Reviewed: Basic Metabolic Panel:  Recent Labs Lab 11/28/12 0445 12/02/12 0610 12/02/12 1610 12/03/12 0725 12/04/12 0540  NA 132* 130* 129* 137 133*  K 3.7 5.1 5.5* 3.3* 3.2*  CL 93* 89* 86* 95* 91*  CO2 25  16* 13* 27 23  GLUCOSE 199* 78 97 134* 132*  BUN 18 37* 43* 18 24*  CREATININE 2.49* 5.45* 5.88* 3.14* 4.50*  CALCIUM 8.2* 8.2* 8.0* 8.3* 8.6  PHOS 3.4  --  10.7*  --  5.5*   Liver Function Tests:  Recent Labs Lab 11/28/12 0445 12/02/12 1610 12/04/12 0540  ALBUMIN 1.8* 1.9* 1.8*   No results found for this basename: LIPASE, AMYLASE,  in the last 168 hours No results found for this basename: AMMONIA,  in the last 168 hours CBC:  Recent Labs Lab 11/29/12 1625 12/01/12 0533 12/02/12 0610 12/03/12 0725 12/04/12 0540  WBC 18.6* 18.7* 35.5* 20.5* 13.8*  HGB 10.1* 8.8* 9.2* 9.1* 8.2*  HCT 30.6* 27.0* 27.9* 27.7* 24.8*  MCV 103.0* 103.4* 103.0* 103.0* 101.6*  PLT 206 177 132* 192 156   Cardiac Enzymes: No results found for this basename: CKTOTAL, CKMB, CKMBINDEX, TROPONINI,  in the last 168 hours BNP  (last 3 results) No results found for this basename: PROBNP,  in the last 8760 hours CBG:  Recent Labs Lab 12/03/12 0755 12/03/12 1220 12/03/12 1616 12/03/12 2210 12/04/12 0740  GLUCAP 112* 151* 141* 194* 148*    Recent Results (from the past 240 hour(s))  URINE CULTURE     Status: None   Collection Time    11/27/12  6:47 PM      Result Value Range Status   Specimen Description URINE, CATHETERIZED   Final   Special Requests NONE   Final   Culture  Setup Time     Final   Value: 11/28/2012 00:11     Performed at [facility] Count     Final   Value: >=100,000 COLONIES/ML     Performed at [facility]   Culture     Final   Value: YEAST     Performed at [facility]   Report Status 11/29/2012 FINAL   Final     Studies: No results found.  Scheduled Meds: . amLODipine  5 mg Oral QPM  . calcium carbonate  1 tablet Oral TID WC  .  ceFAZolin (ANCEF) IV  2 g Intravenous Q M,W,F-HD  . darbepoetin  25 mcg Intravenous Q Mon-HD  . diphenhydrAMINE  12.5 mg Oral Once  . doxercalciferol  4 mcg Intravenous Q M,W,F-HD  . famotidine  20 mg Oral Daily  . feeding supplement (NEPRO CARB STEADY)  237 mL Oral TID BM  . fluconazole  100 mg Oral Daily  . folic acid  0.5 mg Oral Daily  . gentamicin  60 mg Intravenous Q M,W,F-HD  . insulin aspart  0-9 Units Subcutaneous TID WC  . levothyroxine  75 mcg Oral QAC breakfast  . metoprolol  75 mg Oral BID  . multivitamin  1 tablet Oral QHS  . rifampin  300 mg Oral TID  . sodium chloride  3 mL Intravenous Q12H  . vancomycin  500 mg Oral Q6H  . vitamin C  500 mg Oral Daily   Continuous Infusions: . sodium chloride 500 mL (11/28/12 1453)    Principal Problem:   Sepsis Active Problems:   Type II or unspecified type diabetes mellitus with renal manifestations, not stated as uncontrolled(250.40)   HYPERTENSION   PULMONARY HYPERTENSION   GERD   S/P aortic valve replacement   End stage renal disease    Diarrhea   UTI (lower urinary tract infection)   Leukocytosis, unspecified   Bacteremia    Time spent:  35    [facility], Hadiya Spoerl  [facility] Pager [phone]. If 7PM-7AM, please contact night-coverage at [email or web address], password [facility] 12/04/2012, 10:47 AM  LOS: 14 days

## 2012-12-04 NOTE — Progress Notes (Signed)
[facility] Progress Note  Subjective:   Doesn't know if she's having diarrhea. "Didn't eat breakfast because she had nepro"  But nepro is in a cup and she hasn't drunk much. Requesting dry shampoo. Denis sob  Objective Filed Vitals:   12/03/12 1618 12/03/12 2100 12/03/12 2303 12/04/12 0500  BP: 159/61 146/50 146/50 150/48  Pulse: 80 84 84 85  Temp: 98 F (36.7 C) 98.1 F (36.7 C)  98.4 F (36.9 C)  TempSrc:  Oral  Oral  Resp: 17 18  18   Height:      Weight:      SpO2: 95% 98%  95%   Physical Exam General: NAD Heart: RRR Lungs: diminished BS Abdomen: soft Extremities: tr LE edema Dialysis Access: left AVF patent   Dialysis orders: MWF at [facility]  3hrs 50.5kg 2K/2.25Ca 400/800 L arm AVF Heparin 4200  Hectorol 4ug Epo 1800 Venofer none   Assessment/Plan:  1. MSSA bacteremia - with hx AV prosthesis, neg veg on Echo; AVF no signs of infx; per ID 5 weeks Ancef and rifampin stop day 12/30; on gent through 12/2 per ID  2. ESRD - MWF Burl K 3.2 - for HD Monday - start with added K bath  3. Anemia - Hgb 9.1> 8.2 Aranesp 25; folate level >20 on folic acid- check Fe studies - drawn today pending; repeat CBC with HD Monday - transfuse prn  4. Secondary hyperparathyroidism - hectorol 4; P 10.7 11/28 - repeat today 5.5; on hectorol 4, continue Ca CO3 1 tid ac  5. HTN/volume - UF of 2.5 LFriday to a post wt of 50.1; on norvasc 5 and metoprolol 75 bid; needs edw lowered further - will increase time Monday to 3.5 to facilitate ultrafiltration 6. Nutrition - Alb 1.9 renal diet + vit; allowing son to bring in food; change to regular diet - needs supervision; likely losing weight 7. C diff + on po Vanc until 1/12; avoid PPI  8. Yeast UTI - on diflucan  Sheffield Slider, PA-C [facility] Beeper [phone] 12/04/2012,8:39 AM  LOS: 14 days   Renal Attending: Agree with evaluation as articulated above by Ms. Bergman.  Stooling significantly diminished and plan  is for rectal tube removal today.   Nutrition remains sub-optimal but tolerating some Nepro.  Will need SNF post discharge as pt apparently lives alone. Tiyana Galla C   Additional Objective Labs: Basic Metabolic Panel:  Recent Labs Lab 11/28/12 0445  12/02/12 1610 12/03/12 0725 12/04/12 0540  NA 132*  < > 129* 137 133*  K 3.7  < > 5.5* 3.3* 3.2*  CL 93*  < > 86* 95* 91*  CO2 25  < > 13* 27 23  GLUCOSE 199*  < > 97 134* 132*  BUN 18  < > 43* 18 24*  CREATININE 2.49*  < > 5.88* 3.14* 4.50*  CALCIUM 8.2*  < > 8.0* 8.3* 8.6  PHOS 3.4  --  10.7*  --  5.5*  < > = values in this interval not displayed. Liver Function Tests:  Recent Labs Lab 11/28/12 0445 12/02/12 1610 12/04/12 0540  ALBUMIN 1.8* 1.9* 1.8*   CBC:  Recent Labs Lab 11/29/12 1625 12/01/12 0533 12/02/12 0610 12/03/12 0725 12/04/12 0540  WBC 18.6* 18.7* 35.5* 20.5* 13.8*  HGB 10.1* 8.8* 9.2* 9.1* 8.2*  HCT 30.6* 27.0* 27.9* 27.7* 24.8*  MCV 103.0* 103.4* 103.0* 103.0* 101.6*  PLT 206 177 132* 192 156   Blood Culture    Component Value Date/Time  SDES URINE, CATHETERIZED 11/27/2012 1847   SPECREQUEST NONE 11/27/2012 1847   CULT  Value: YEAST Performed at [facility] 11/27/2012 1847   REPTSTATUS 11/29/2012 FINAL 11/27/2012 1847  CBG:  Recent Labs Lab 12/03/12 0755 12/03/12 1220 12/03/12 1616 12/03/12 2210 12/04/12 0740  GLUCAP 112* 151* 141* 194* 148*  Medications: . sodium chloride 500 mL (11/28/12 1453)   . amLODipine  5 mg Oral QPM  . calcium carbonate  1 tablet Oral TID WC  .  ceFAZolin (ANCEF) IV  2 g Intravenous Q M,W,F-HD  . darbepoetin  25 mcg Intravenous Q Mon-HD  . diphenhydrAMINE  12.5 mg Oral Once  . doxercalciferol  4 mcg Intravenous Q M,W,F-HD  . famotidine  20 mg Oral Daily  . feeding supplement (NEPRO CARB STEADY)  237 mL Oral TID BM  . fluconazole  100 mg Oral Daily  . folic acid  0.5 mg Oral Daily  . gentamicin  60 mg Intravenous Q M,W,F-HD  . insulin aspart   0-9 Units Subcutaneous TID WC  . levothyroxine  75 mcg Oral QAC breakfast  . metoprolol  75 mg Oral BID  . multivitamin  1 tablet Oral QHS  . rifampin  300 mg Oral TID  . sodium chloride  3 mL Intravenous Q12H  . vancomycin  500 mg Oral Q6H  . vitamin C  500 mg Oral Daily

## 2012-12-05 LAB — GLUCOSE, CAPILLARY
Glucose-Capillary: 122 mg/dL — ABNORMAL HIGH (ref 70–99)
Glucose-Capillary: 226 mg/dL — ABNORMAL HIGH (ref 70–99)
Glucose-Capillary: 140 mg/dL — ABNORMAL HIGH (ref 70–99)

## 2012-12-05 LAB — RENAL FUNCTION PANEL
Albumin: 1.8 g/dL — ABNORMAL LOW (ref 3.5–5.2)
BUN: 33 mg/dL — ABNORMAL HIGH (ref 6–23)
CO2: 23 mEq/L (ref 19–32)
Calcium: 8.5 mg/dL (ref 8.4–10.5)
Chloride: 90 mEq/L — ABNORMAL LOW (ref 96–112)
Creatinine, Ser: 5.58 mg/dL — ABNORMAL HIGH (ref 0.50–1.10)
GFR calc Af Amer: 7 mL/min — ABNORMAL LOW (ref >90)
GFR calc non Af Amer: 6 mL/min — ABNORMAL LOW (ref >90)
Glucose, Bld: 143 mg/dL — ABNORMAL HIGH (ref 70–99)
Phosphorus: 6.1 mg/dL — ABNORMAL HIGH (ref 2.3–4.6)
Potassium: 3.2 mEq/L — ABNORMAL LOW (ref 3.5–5.1)
Sodium: 132 mEq/L — ABNORMAL LOW (ref 135–145)

## 2012-12-05 LAB — CBC
HCT: 25.6 % — ABNORMAL LOW (ref 36.0–46.0)
Hemoglobin: 8.6 g/dL — ABNORMAL LOW (ref 12.0–15.0)
MCH: 33.7 pg (ref 26.0–34.0)
MCHC: 33.6 g/dL (ref 30.0–36.0)
MCV: 100.4 fL — ABNORMAL HIGH (ref 78.0–100.0)
Platelets: 142 10*3/uL — ABNORMAL LOW (ref 150–400)
RBC: 2.55 MIL/uL — ABNORMAL LOW (ref 3.87–5.11)
RDW: 16.7 % — ABNORMAL HIGH (ref 11.5–15.5)
WBC: 13.8 10*3/uL — ABNORMAL HIGH (ref 4.0–10.5)

## 2012-12-05 MED ORDER — HEPARIN SODIUM (PORCINE) 1000 UNIT/ML DIALYSIS: 20.0000 [IU]/kg | INTRAMUSCULAR | Status: DC | PRN

## 2012-12-05 MED ORDER — FAMOTIDINE 20 MG PO TABS
20.0000 mg | ORAL_TABLET | Freq: Every day | ORAL | Status: DC
  Administered 2012-12-06: 20 mg via ORAL
  Filled 2012-12-05: qty 1

## 2012-12-05 MED ORDER — NEPRO/CARBSTEADY PO LIQD
237.0000 mL | Freq: Three times a day (TID) | ORAL | Status: DC
  Administered 2012-12-05 – 2012-12-06 (×3): 237 mL via ORAL

## 2012-12-05 MED ORDER — SODIUM CHLORIDE 0.9 % IV SOLN: 100.0000 mL | INTRAVENOUS | Status: DC | PRN

## 2012-12-05 MED ORDER — PENTAFLUOROPROP-TETRAFLUOROETH EX AERO: 1.0000 "application " | INHALATION_SPRAY | CUTANEOUS | Status: DC | PRN

## 2012-12-05 MED ORDER — NEPRO/CARBSTEADY PO LIQD
237.0000 mL | ORAL | Status: DC | PRN
  Filled 2012-12-05: qty 237

## 2012-12-05 MED ORDER — VANCOMYCIN 50 MG/ML ORAL SOLUTION
500.0000 mg | Freq: Four times a day (QID) | ORAL | Status: DC
  Administered 2012-12-05 – 2012-12-06 (×6): 500 mg via ORAL
  Filled 2012-12-05 (×8): qty 10

## 2012-12-05 MED ORDER — HEPARIN SODIUM (PORCINE) 1000 UNIT/ML DIALYSIS: 1000.0000 [IU] | INTRAMUSCULAR | Status: DC | PRN

## 2012-12-05 MED ORDER — DARBEPOETIN ALFA-POLYSORBATE 25 MCG/0.42ML IJ SOLN
INTRAMUSCULAR | Status: AC
  Filled 2012-12-05: qty 0.42

## 2012-12-05 MED ORDER — LIDOCAINE-PRILOCAINE 2.5-2.5 % EX CREA
1.0000 "application " | TOPICAL_CREAM | CUTANEOUS | Status: DC | PRN
  Filled 2012-12-05: qty 5

## 2012-12-05 MED ORDER — ALTEPLASE 2 MG IJ SOLR
2.0000 mg | Freq: Once | INTRAMUSCULAR | Status: DC | PRN
  Filled 2012-12-05: qty 2

## 2012-12-05 MED ORDER — LIDOCAINE HCL (PF) 1 % IJ SOLN: 5.0000 mL | INTRAMUSCULAR | Status: DC | PRN

## 2012-12-05 NOTE — Progress Notes (Signed)
Utilization review completed.  

## 2012-12-05 NOTE — Progress Notes (Signed)
[facility] PROGRESS NOTE  [name] WUJ:[id number] DOB: 1920-06-27 DOA: 11/20/2012 PCP: Tillman Abide, MD 79 y.o. female with past medical history of end-stage renal disease, diabetes mellitus, CAD and prior C. difficile who despite her advanced age and medical problems is rather functional and lives alone with her son nearby. Patient had been in her usual state of health when she complained to her son about chills 3 days prior to admit. She went to dialysis and had a low-grade temp. Dialysis was done partially on that day. Patient visited her PCP and a chest x-ray was unremarkable. She spent the weekend with her son, and was brought to the ED when she was noted to be progressively weaker with diarrhea.  In the emergency room the patient was noted to have a lactic acid level 11.9, anion gap of 24 and a glucose of 206. Her white count was 13.2-92% shift and her urine was positive for UTI. Stool studies were sent for C. difficile as was urine culture and patient was started on IV Rocephin and Flagyl.  In the hospital, she was found to be c diff positive as well have have staph bacteremia and fungal UTI- TEE was not done due to advanced age.  Overall poor prognosis.  Son not interested in palliative care.  Await SNF   Assessment/Plan: Sepsis  due to Staph bacteremia C diff and now fungal UTI   Leukocytosis -continue to monitor  MRSA bacteremia - 2/2 blood cx +  - TTE w/o overt vegetations  - ID and cardio have decided that there is not a dire need for TEE in this frail lady.  - plan for treatment is :  - Ancef for 6 wks with HD through 12/30  - Gent to be given through 12/06/12  - Rifampin to stop on 01/03/13   C diff colitis  Oral vanc -- stop date 01/17/12  - avoid PPIs -   GERD with significant Epigastric tenderness and poor appetite  sx have improved with Pepcid BID   UTI  Lactobacillus species w/ < 50, 000 colonies on original UA  - repeat UA markedly c/w candida UTI- on  Diflucan   ESRD on HD  Nephrology is following - dialysis access (fistula) does not appear to be infected - Vasc Surgery has seen and agrees   Anemia of CKD  - no evidence of acute blood loss   Macrocytosis  B12 ok  CAD w/ Hx CABG  Asymptomatic   DM2  CBG reasonably controlled at the present time -   HTN  Cont to follow w/o change in tx plan today   Pulmonary HTN  S/P AoVR  Does not require chronic anticoagulation due to use of bioprosthetic valve - at risk for endocarditis   Moderate Protein Calorie malnutrition  encourage PO intake - Nepro shakes - Magic cup - allowing son to bring food from outside  - pt eats poorly when son is not here to encourage her   Code Status: NO CODE BLUE  Family Communication:   son  Disposition Plan:  need SNF   Consultants:  Nephrology  ID  Vasc Surgery   Procedures:  TTE   Antibiotics:  Vanc oral 11/16 >>  Rifampin 11/18 >>  Gentamicin 11/18 >>  Nafcillin 11/22 >>1125  Ancef 11/25  Fluconazole 11/25  Ancef 11/18>>11/21  Rocephin 11/16 >>11/17  Flagyl 11/16 >>11/18   DVT prophylaxis:  SCDs    HPI/Subjective: In dialysis- tired and cold  Objective: Filed Vitals:  12/05/12 1230  BP: 85/47  Pulse: 118  Temp:   Resp:     Intake/Output Summary (Last 24 hours) at 12/05/12 1259 Last data filed at 12/05/12 0836  Gross per 24 hour  Intake    420 ml  Output      0 ml  Net    420 ml   Filed Weights   12/02/12 1545 12/02/12 1936 12/02/12 2236  Weight: 53 kg (116 lb 13.5 oz) 50.5 kg (111 lb 5.3 oz) 50.1 kg (110 lb 7.2 oz)    Exam:  General: sleepy, No acute respiratory distress  Lungs: Clear to auscultation bilaterally without wheezes or crackles  Cardiovascular: Regular rate and rhythm without gallop or rub normal S1 and S2  Abdomen: Nontender, nondistended, soft, bowel sounds positive, no rebound, no ascites, no appreciable mass  Extremities: No significant cyanosis, clubbing, edema bilateral lower  extremities   Data Reviewed: Basic Metabolic Panel:  Recent Labs Lab 12/02/12 0610 12/02/12 1610 12/03/12 0725 12/04/12 0540 12/05/12 0924  NA 130* 129* 137 133* 132*  K 5.1 5.5* 3.3* 3.2* 3.2*  CL 89* 86* 95* 91* 90*  CO2 16* 13* 27 23 23   GLUCOSE 78 97 134* 132* 143*  BUN 37* 43* 18 24* 33*  CREATININE 5.45* 5.88* 3.14* 4.50* 5.58*  CALCIUM 8.2* 8.0* 8.3* 8.6 8.5  PHOS  --  10.7*  --  5.5* 6.1*   Liver Function Tests:  Recent Labs Lab 12/02/12 1610 12/04/12 0540 12/05/12 0924  ALBUMIN 1.9* 1.8* 1.8*   No results found for this basename: LIPASE, AMYLASE,  in the last 168 hours No results found for this basename: AMMONIA,  in the last 168 hours CBC:  Recent Labs Lab 12/01/12 0533 12/02/12 0610 12/03/12 0725 12/04/12 0540 12/05/12 0924  WBC 18.7* 35.5* 20.5* 13.8* 13.8*  HGB 8.8* 9.2* 9.1* 8.2* 8.6*  HCT 27.0* 27.9* 27.7* 24.8* 25.6*  MCV 103.4* 103.0* 103.0* 101.6* 100.4*  PLT 177 132* 192 156 142*   Cardiac Enzymes: No results found for this basename: CKTOTAL, CKMB, CKMBINDEX, TROPONINI,  in the last 168 hours BNP (last 3 results) No results found for this basename: PROBNP,  in the last 8760 hours CBG:  Recent Labs Lab 12/04/12 0740 12/04/12 1125 12/04/12 1604 12/04/12 2257 12/05/12 0751  GLUCAP 148* 159* 123* 157* 122*    Recent Results (from the past 240 hour(s))  URINE CULTURE     Status: None   Collection Time    11/27/12  6:47 PM      Result Value Range Status   Specimen Description URINE, CATHETERIZED   Final   Special Requests NONE   Final   Culture  Setup Time     Final   Value: 11/28/2012 00:11     Performed at [facility] Count     Final   Value: >=100,000 COLONIES/ML     Performed at [facility]   Culture     Final   Value: YEAST     Performed at [facility]   Report Status 11/29/2012 FINAL   Final     Studies: No results found.  Scheduled Meds: . amLODipine  5 mg Oral QPM  .  calcium carbonate  1 tablet Oral TID WC  .  ceFAZolin (ANCEF) IV  2 g Intravenous Q M,W,F-HD  . darbepoetin      . darbepoetin  25 mcg Intravenous Q Mon-HD  . diphenhydrAMINE  12.5 mg Oral Once  . doxercalciferol  4 mcg Intravenous Q M,W,F-HD  . famotidine  20 mg Oral Daily  . feeding supplement (NEPRO CARB STEADY)  237 mL Oral TID BM  . fluconazole  100 mg Oral Daily  . folic acid  0.5 mg Oral Daily  . gentamicin  60 mg Intravenous Q M,W,F-HD  . insulin aspart  0-9 Units Subcutaneous TID WC  . levothyroxine  75 mcg Oral QAC breakfast  . metoprolol  75 mg Oral BID  . multivitamin  1 tablet Oral QHS  . rifampin  300 mg Oral TID  . sodium chloride  3 mL Intravenous Q12H  . vitamin C  500 mg Oral Daily   Continuous Infusions: . sodium chloride 500 mL (11/28/12 1453)    Principal Problem:   Sepsis Active Problems:   Type II or unspecified type diabetes mellitus with renal manifestations, not stated as uncontrolled(250.40)   HYPERTENSION   PULMONARY HYPERTENSION   GERD   S/P aortic valve replacement   End stage renal disease   Diarrhea   UTI (lower urinary tract infection)   Leukocytosis, unspecified   Bacteremia    Time spent: 35    [facility], [name]  [facility] Pager [phone]. If 7PM-7AM, please contact night-coverage at [email or web address], password [facility] 12/05/2012, 12:59 PM  LOS: 15 days

## 2012-12-05 NOTE — Progress Notes (Signed)
Subjective:  No appetite, wants to get out of bed, resume previous activities  Objective: Vital signs in last 24 hours: Temp:  [98 F (36.7 C)-98.3 F (36.8 C)] 98.3 F (36.8 C) (12/01 0500) Pulse Rate:  [81-85] 81 (12/01 0500) Resp:  [16-17] 16 (12/01 0500) BP: (153-175)/(53-57) 166/54 mmHg (12/01 0500) SpO2:  [98 %-100 %] 98 % (12/01 0500) Weight change:   Intake/Output from previous day: 11/30 0701 - 12/01 0700 In: 480 [P.O.:480] Out: 0    Lab Results:  Recent Labs  12/03/12 0725 12/04/12 0540  WBC 20.5* 13.8*  HGB 9.1* 8.2*  HCT 27.7* 24.8*  PLT 192 156   BMET:  Recent Labs  12/02/12 1610 12/03/12 0725 12/04/12 0540  NA 129* 137 133*  K 5.5* 3.3* 3.2*  CL 86* 95* 91*  CO2 13* 27 23  GLUCOSE 97 134* 132*  BUN 43* 18 24*  CREATININE 5.88* 3.14* 4.50*  CALCIUM 8.0* 8.3* 8.6  ALBUMIN 1.9*  --  1.8*   No results found for this basename: PTH,  in the last 72 hours Iron Studies:  Recent Labs  12/04/12 0540  IRON 147*  TIBC NOT CALC  FERRITIN >16500*   EXAM: General appearance:  Alert, in no apparent distress Resp:  CTA without rales, rhonchi, or wheezes Cardio:  RRR with Gr II/VI systolic murmur, no rub GI: + BS, soft and nontender Extremities:  No edema Access:  AVF @ LUA with + bruit  Dialysis orders: MWF at [facility]  3hrs 50.5kg 2K/2.25Ca 400/800 L arm AVF Heparin 4200  Hectorol 4ug Epo 1800 Venofer none  Assessment/Plan: 1. MSSA bacteremia - 2/2 + BCs 11/18; Hx AVR, no vegetation per echo 11/18; AVF with no sign of infection; Ancef & Rifampin through 12/30, Gentamicin through 12/2. 2. C diff colitis - Vancomycin PO through 1/12. 3. UTI - culture + for yeast, on Diflucan PO. 4. ESRD - HD on MWF @ [facility]; K 3.2.  HD pending. 5. HTN/Volume - BP 166/54 on Norvasc 5 mg qhs, Metoprolol 75 mg bid; wt 50.1 kg (below EDW).  Decrease EDW. 6. Anemia - Hgb 8.2 on Aranesp 25 mcg on Mon. 7. Sec HPT - Ca 8.6 (10.4 corrected), P 5.5; Hectorol 4 mcg, Tums with  meals. 8. Nutrition - Alb 1.8; now regular diet & vitamin, allowing son to bring food.    LOS: 15 days   LYLES,CHARLES 12/05/2012,7:30 AM   I have seen and examined patient, discussed with PA and agree with assessment and plan as outlined above. Vinson Moselle MD pager [phone]    cell [phone] 12/05/2012, 10:16 AM

## 2012-12-05 NOTE — Progress Notes (Signed)
Physical Therapy Treatment Patient Details Name: [name] MRN: [id number] DOB: 10-16-1920 Today's Date: 12/05/2012 Time: 6213-0865 PT Time Calculation (min): 19 min  PT Assessment / Plan / Recommendation  History of Present Illness pt is a 79 y.o. female adm secondary to n/v.    PT Comments   Pt limited in therapy today secondary to being lethargic. Pt was able to open eyes and participate with max multimodal cues. Pt required max encouragement to participate. Was able to amb around bed in room with mod to max (A). Pt is very unsteady and is a fall risk. Cont to recommend ST SNF upon acute D/C. Will cont to follow per POC.   Follow Up Recommendations  SNF;Supervision/Assistance - 24 hour     Does the patient have the potential to tolerate intense rehabilitation     Barriers to Discharge        Equipment Recommendations  Other (comment)    Recommendations for Other Services OT consult  Frequency Min 2X/week   Progress towards PT Goals Progress towards PT goals: Progressing toward goals  Plan Current plan remains appropriate    Precautions / Restrictions Precautions Precautions: Fall Precaution Comments: reports she has a recent fall per her son Restrictions Weight Bearing Restrictions: No   Pertinent Vitals/Pain No c/o pain.     Mobility  Bed Mobility Bed Mobility: Supine to Sit;Sitting - Scoot to Edge of Bed;Sit to Supine Supine to Sit: 4: Min assist;HOB elevated;With rails Sitting - Scoot to Edge of Bed: 4: Min assist Sit to Supine: 3: Mod assist;HOB flat Details for Bed Mobility Assistance: pt with increaed difficulty with bed mobility today secondary to fatigue and decreased strength; pt required (A) bringing trunk to upright sitting position; (A) to advance LEs back into supine position and to control trunk to supine position; pt lethargic Transfers Transfers: Sit to Stand;Stand to Sit Sit to Stand: From bed;2: Max assist;With upper extremity assist Stand to Sit:  2: Max assist;To bed;With upper extremity assist Details for Transfer Assistance: pt with increased difficulty with transfers secondary to fatigue and being lethargic; pt could open eyes and participate with max cues; (A) to maintain balance; pt with lean to Rt and unsteady; pt demo decreased safety awareness with transfers; is a fall risk  Ambulation/Gait Ambulation/Gait Assistance: 3: Mod assist Ambulation Distance (Feet): 10 Feet Assistive device: Rolling walker Ambulation/Gait Assistance Details: Pt with increased difficulty amb with RW: required mod (A) to maintain balance with ambulation and manage RW; pt was able to keep eyes open and participate with ambulation; very flat affect; unsteady and is a fall risk due to decr awareness of safety and balance deficits  Gait Pattern: Step-through pattern;Trunk flexed;Narrow base of support;Shuffle Gait velocity: very decreased  Stairs: No Wheelchair Mobility Wheelchair Mobility: No    Exercises General Exercises - Lower Extremity Ankle Circles/Pumps: AROM;Both;10 reps;Supine Long Arc Quad: AROM;Both;10 reps;Seated;Other (comment) (max cues to stay on task for exercise)   PT Diagnosis:    PT Problem List:   PT Treatment Interventions:     PT Goals (current goals can now be found in the care plan section) Acute Rehab PT Goals Patient Stated Goal: to go to dialysis  PT Goal Formulation: With patient Time For Goal Achievement: 12/16/12 Potential to Achieve Goals: Good  Visit Information  Last PT Received On: 12/05/12 Assistance Needed: +2 (for ambulation ) History of Present Illness: pt is a 78 y.o. female adm secondary to n/v.     Subjective Data  Subjective: pt  lying supine; lethargic today; " i just want to go to dialysis"; pt was agreeable to ambulate with max encouragemetn  Patient Stated Goal: to go to dialysis    Cognition  Cognition Arousal/Alertness: Lethargic;Suspect due to medications Behavior During Therapy: Flat  affect Overall Cognitive Status: No family/caregiver present to determine baseline cognitive functioning Area of Impairment: Orientation;Attention;Following commands;Safety/judgement Orientation Level: Disoriented to;Time;Situation Current Attention Level: Focused Memory: Decreased short-term memory Following Commands: Follows one step commands inconsistently Safety/Judgement: Decreased awareness of deficits;Decreased awareness of safety Problem Solving: Decreased initiation;Slow processing;Difficulty sequencing;Requires verbal cues;Requires tactile cues General Comments: no family member present to determine baseline     Balance  Balance Balance Assessed: Yes Static Sitting Balance Static Sitting - Balance Support: Feet unsupported;Bilateral upper extremity supported Static Sitting - Level of Assistance: 5: Stand by assistance;4: Min assist Static Sitting - Comment/# of Minutes: pt continuously leans posteriorly and demo LOB posteriorly; required tactile and verbal cues for upright sitting position at EOB  End of Session PT - End of Session Equipment Utilized During Treatment: Gait belt Activity Tolerance: Patient limited by lethargy Patient left: in chair;with call bell/phone within reach;with nursing/sitter in room Nurse Communication: Mobility status   GP     Donell Sievert, [place] [phone] 12/05/2012, 8:40 AM

## 2012-12-05 NOTE — Progress Notes (Signed)
NUTRITION FOLLOW-UP  DOCUMENTATION CODES Per approved criteria  -Not Applicable   INTERVENTION: Continue Nepro Carb Steady TID Continue RENA-VIT daily Agree with Regular, liberalized diet RD to follow for nutrition care plan  NUTRITION DIAGNOSIS: Increased nutrient needs related to HD, sepsis as evidenced by estimated nutrition needs. Ongoing.  Goal: Pt to meet >/= 90% of their estimated nutrition needs   Monitor:  PO & supplemental intake, weight, labs, I/O's  ASSESSMENT: Patient with PMH of ESRD, diabetes mellitus, CAD and C. Difficile; admitted with nausea, vomiting and significant amounts of diarrhea; in ED urine was positive for UTI; started on IV Rocephin and Flagyl.  Pt has been found to be c diff positive, has staph bacteremia and fungal UTI. TEE was not done 2/2 advanced age. Per MD note, pt with overall poor prognosis, son is not interested in palliative care. Pt appears to only be eating well when son is here to encourage her. Eating mostly 5-10% of meals.  Potassium is low at 3.2. Phosphorus is trending up at 6.1.  Height: Ht Readings from Last 1 Encounters:  11/30/12 5\' 2"  (1.575 m)    Weight: Wt Readings from Last 1 Encounters:  12/05/12 110 lb 14.3 oz (50.3 kg)   BMI:  Body mass index is 20.28 kg/(m^2). Normal weight.  Estimated Nutritional Needs: Kcal: 1400-1600 Protein: 65-75 gm Fluid: 1200 ml  Skin: Intact  Diet Order: General   EDUCATION NEEDS: -No education needs identified at this time   Intake/Output Summary (Last 24 hours) at 12/05/12 1406 Last data filed at 12/05/12 1250  Gross per 24 hour  Intake    420 ml  Output   2278 ml  Net  -1858 ml    Last BM: 12/1 (diarrhea)  Labs:   Recent Labs Lab 12/02/12 1610 12/03/12 0725 12/04/12 0540 12/05/12 0924  NA 129* 137 133* 132*  K 5.5* 3.3* 3.2* 3.2*  CL 86* 95* 91* 90*  CO2 13* 27 23 23   BUN 43* 18 24* 33*  CREATININE 5.88* 3.14* 4.50* 5.58*  CALCIUM 8.0* 8.3* 8.6 8.5  PHOS  10.7*  --  5.5* 6.1*  GLUCOSE 97 134* 132* 143*    CBG (last 3)   Recent Labs  12/04/12 2257 12/05/12 0751 12/05/12 1352  GLUCAP 157* 122* 85    Scheduled Meds: . amLODipine  5 mg Oral QPM  . calcium carbonate  1 tablet Oral TID WC  .  ceFAZolin (ANCEF) IV  2 g Intravenous Q M,W,F-HD  . darbepoetin  25 mcg Intravenous Q Mon-HD  . diphenhydrAMINE  12.5 mg Oral Once  . doxercalciferol  4 mcg Intravenous Q M,W,F-HD  . [START ON 12/06/2012] famotidine  20 mg Oral Daily  . feeding supplement (NEPRO CARB STEADY)  237 mL Oral TID BM  . fluconazole  100 mg Oral Daily  . folic acid  0.5 mg Oral Daily  . gentamicin  60 mg Intravenous Q M,W,F-HD  . insulin aspart  0-9 Units Subcutaneous TID WC  . levothyroxine  75 mcg Oral QAC breakfast  . metoprolol  75 mg Oral BID  . multivitamin  1 tablet Oral QHS  . rifampin  300 mg Oral TID  . sodium chloride  3 mL Intravenous Q12H  . vitamin C  500 mg Oral Daily    Continuous Infusions: . sodium chloride 500 mL (11/28/12 1453)    Jarold Motto MS, RD, LDN Pager: [phone] After-hours pager: [phone]

## 2012-12-05 NOTE — Progress Notes (Signed)
OT Cancellation Note  Patient Details Name: [name] MRN: [id number] DOB: 09-12-1920   Cancelled Treatment:    Reason Eval/Treat Not Completed: Patient politely declined, stating that she just recently returned from dialysis and needed to rest a while. Will re attempt tomorrow    Galen Manila 12/05/2012, 3:07 PM

## 2012-12-05 NOTE — Procedures (Signed)
I was present at this dialysis session, have reviewed the session itself and made  appropriate changes   Vinson Moselle MD  pager [phone]    cell [phone]  12/05/2012, 10:16 AM

## 2012-12-06 LAB — GLUCOSE, CAPILLARY
Glucose-Capillary: 101 mg/dL — ABNORMAL HIGH (ref 70–99)
Glucose-Capillary: 197 mg/dL — ABNORMAL HIGH (ref 70–99)

## 2012-12-06 MED ORDER — DARBEPOETIN ALFA-POLYSORBATE 25 MCG/0.42ML IJ SOLN: 25.0000 ug | INTRAMUSCULAR | Status: DC

## 2012-12-06 MED ORDER — INSULIN ASPART 100 UNIT/ML ~~LOC~~ SOLN: 0.0000 [IU] | Freq: Three times a day (TID) | SUBCUTANEOUS | Status: DC

## 2012-12-06 MED ORDER — HYDROCODONE-ACETAMINOPHEN 5-325 MG PO TABS: 1.0000 | ORAL_TABLET | Freq: Four times a day (QID) | ORAL | Status: DC | PRN

## 2012-12-06 MED ORDER — VANCOMYCIN 50 MG/ML ORAL SOLUTION: 500.0000 mg | Freq: Four times a day (QID) | ORAL | Status: DC

## 2012-12-06 MED ORDER — FOLIC ACID 0.5 MG HALF TAB: 0.5000 mg | ORAL_TABLET | Freq: Every day | ORAL | Status: AC

## 2012-12-06 MED ORDER — CEFAZOLIN SODIUM-DEXTROSE 2-3 GM-% IV SOLR: 2.0000 g | INTRAVENOUS | Status: DC

## 2012-12-06 MED ORDER — ASCORBIC ACID 500 MG PO TABS: 500.0000 mg | ORAL_TABLET | Freq: Every day | ORAL | Status: AC

## 2012-12-06 MED ORDER — DOXERCALCIFEROL 4 MCG/2ML IV SOLN: 4.0000 ug | INTRAVENOUS | Status: DC

## 2012-12-06 MED ORDER — RIFAMPIN 300 MG PO CAPS: 300.0000 mg | ORAL_CAPSULE | Freq: Three times a day (TID) | ORAL | Status: DC

## 2012-12-06 MED ORDER — GENTAMICIN IN SALINE 1.2-0.9 MG/ML-% IV SOLN: 60.0000 mg | INTRAVENOUS | Status: DC

## 2012-12-06 MED ORDER — AMLODIPINE BESYLATE 5 MG PO TABS: 5.0000 mg | ORAL_TABLET | Freq: Every evening | ORAL | Status: DC

## 2012-12-06 MED ORDER — CALCIUM CARBONATE ANTACID 500 MG PO CHEW: 1.0000 | CHEWABLE_TABLET | Freq: Three times a day (TID) | ORAL | Status: AC

## 2012-12-06 MED ORDER — NEPRO/CARBSTEADY PO LIQD: 237.0000 mL | Freq: Three times a day (TID) | ORAL | Status: DC

## 2012-12-06 MED ORDER — FAMOTIDINE 20 MG PO TABS: 20.0000 mg | ORAL_TABLET | Freq: Every day | ORAL | Status: DC

## 2012-12-06 MED ORDER — FLUCONAZOLE 100 MG PO TABS: 100.0000 mg | ORAL_TABLET | Freq: Every day | ORAL | Status: DC

## 2012-12-06 NOTE — Discharge Summary (Signed)
Physician Discharge Summary  BROOKLEE MICHELIN EAV:[id number] DOB: August 04, 1920 DOA: 11/20/2012  PCP: Tillman Abide, MD  Admit date: 11/20/2012 Discharge date: 12/06/2012  Time spent: 35 minutes  Recommendations for Outpatient Follow-up:  Ancef for 6 wks with HD through 12/30  Gent to be given through 12/06/12   Rifampin to stop on 01/03/13 Oral vanc -- stop date 01/16/13 Diflucan through 12/4  CBC, BMP 1 week HD per nephro- M, W, F schedule SSI  Discharge Diagnoses:  Principal Problem:   Sepsis Active Problems:   Type II or unspecified type diabetes mellitus with renal manifestations, not stated as uncontrolled(250.40)   HYPERTENSION   PULMONARY HYPERTENSION   GERD   S/P aortic valve replacement   End stage renal disease   Diarrhea   UTI (lower urinary tract infection)   Leukocytosis, unspecified   Bacteremia   Discharge Condition: improved  Diet recommendation: renal  Filed Weights   12/02/12 1936 12/02/12 2236 12/05/12 0840  Weight: 50.5 kg (111 lb 5.3 oz) 50.1 kg (110 lb 7.2 oz) 50.3 kg (110 lb 14.3 oz)    History of present illness:  [name] is a 79 y.o. female  With past medical history of end-stage renal disease, diabetes mellitus, CAD and C. difficile in the past who despite her advanced age and medical problems is rather functional and lives alone with her son nearby. Patient has been in her usual state of health when she complained her son about chills 3 days ago. She went to dialysis on Friday and had a low-grade temp. Dialysis was done partially on that day. Patient to go see her PCP and chest x-ray result which was unremarkable. That time was 99. She stated the weekend with her son and her that time has been feeling worse and apparently last night with some episodes of diarrhea and even fell on the way back to her bed from her walker small bruise above her right eye. Synovitis today this morning that she was having nausea and vomiting and significant amounts of  diarrhea. He brought her into the emergency room  The emergency room patient was noted to have a lactic acid level II.9, creatinine 6.73 consistent with her renal failure, anion gap of 24 and a glucose of 206. Her white count was 13.2-92% shift and her urine was positive for strong UTI. Stool studies were sent for C. difficile as was urine culture and patient was started on IV Rocephin and Flagyl. Hospitalists were called for further evaluation and admission.   Hospital Course:  Sepsis  due to Staph bacteremia C diff and now fungal UTI  -resolved  Leukocytosis  -decreasing Monitor  MRSA bacteremia - 2/2 blood cx +  - TTE w/o overt vegetations  - ID and cardio have decided that there is not a dire need for TEE in this frail lady.  - plan for treatment is :  - Ancef for 6 wks with HD through 12/30  - Gent to be given through 12/06/12  - Rifampin to stop on 01/03/13   C diff colitis  Oral vanc -- stop date 01/17/12  - avoid PPIs -   GERD with significant Epigastric tenderness and poor appetite  sx have improved with Pepcid BID   UTI  Lactobacillus species w/ < 50, 000 colonies on original UA  - repeat UA markedly c/w candida UTI- on Diflucan  -10 day course of treatment  ESRD on HD  Nephrology is following - dialysis access (fistula) does not appear to  be infected - Vasc Surgery has seen and agrees   Anemia of CKD  - no evidence of acute blood loss   Macrocytosis  B12 ok   CAD w/ Hx CABG  Asymptomatic   DM2  CBG reasonably controlled at the present time -  -ssi  HTN  Cont to follow w/o change in tx plan today   Pulmonary HTN   S/P AoVR  Does not require chronic anticoagulation due to use of bioprosthetic valve - at risk for endocarditis   Moderate Protein Calorie malnutrition  encourage PO intake - Nepro shakes - Magic cup - allowing son to bring food from outside  - pt eats poorly when son is not there to encourage her   Procedures: ZOX:[id number]:  - No  significant change since 08/18/10; normal LV function and normally functioning bioprosthetic aortic valve with normal mean gradient.    Consultations:  ID, renal, vasc surgery  Discharge Exam: Filed Vitals:   12/06/12 0600  BP: 160/53  Pulse: 83  Temp: 98.1 F (36.7 C)  Resp: 16    General: pleasant/cooperative Cardiovascular: rrr Respiratory: clear anterior  Discharge Instructions  Discharge Orders   Future Appointments Provider Department Dept Phone   12/08/2012 2:15 PM [name], MD [facility] [phone]   12/20/2012 2:00 PM Gardiner Barefoot, MD [facility] [phone]   Future Orders Complete By Expires   Discharge instructions  As directed    Comments:     Ancef for 6 wks with HD through 12/30  Gent to be given through 12/06/12   Rifampin to stop on 01/03/13 Oral vanc -- stop date 01/16/13 Diflucan through 12/4  Renal diet Stop diflucan   Increase activity slowly  As directed        Medication List    STOP taking these medications       aspirin 81 MG tablet     pravastatin 20 MG tablet  Commonly known as:  PRAVACHOL     SYSTANE 0.4-0.3 % Soln  Generic drug:  Polyethyl Glycol-Propyl Glycol     Vitamin C 500 MG Chew  Replaced by:  ascorbic acid 500 MG tablet      TAKE these medications       amLODipine 5 MG tablet  Commonly known as:  NORVASC  Take 1 tablet (5 mg total) by mouth every evening.     ascorbic acid 500 MG tablet  Commonly known as:  VITAMIN C  Take 1 tablet (500 mg total) by mouth daily.     b complex-vitamin c-folic acid 0.8 MG Tabs tablet  Take 0.8 mg by mouth at bedtime.     calcium carbonate 500 MG chewable tablet  Commonly known as:  TUMS - dosed in mg elemental calcium  Chew 1 tablet (200 mg of elemental calcium total) by mouth 3 (three) times daily with meals.     ceFAZolin 2-3 GM-% Solr  Commonly known as:  ANCEF  Inject 50 mLs (2 g total) into the  vein every Monday, Wednesday, and Friday with hemodialysis.     darbepoetin 25 MCG/0.42ML Soln injection  Commonly known as:  ARANESP  Inject 0.42 mLs (25 mcg total) into the vein every Monday with hemodialysis.     doxercalciferol 4 MCG/2ML injection  Commonly known as:  HECTOROL  Inject 2 mLs (4 mcg total) into the vein every Monday, Wednesday, and Friday with hemodialysis.     famotidine 20 MG tablet  Commonly  known as:  PEPCID  Take 1 tablet (20 mg total) by mouth daily.     feeding supplement (NEPRO CARB STEADY) Liqd  Take 237 mLs by mouth 3 (three) times daily between meals.     fluconazole 100 MG tablet  Commonly known as:  DIFLUCAN  Take 1 tablet (100 mg total) by mouth daily.     folic acid 0.5 MG tablet  Commonly known as:  FOLVITE  Take 0.5 tablets (0.5 mg total) by mouth daily.     gentamicin 1.2-0.9 MG/ML-%  Commonly known as:  GARAMYCIN  Inject 50 mLs (60 mg total) into the vein every Monday, Wednesday, and Friday with hemodialysis.     HYDROcodone-acetaminophen 5-325 MG per tablet  Commonly known as:  NORCO/VICODIN  Take 1 tablet by mouth every 6 (six) hours as needed for moderate pain.     insulin aspart 100 UNIT/ML injection  Commonly known as:  novoLOG  Inject 0-9 Units into the skin 3 (three) times daily with meals.     levothyroxine 75 MCG tablet  Commonly known as:  SYNTHROID, LEVOTHROID  Take 75 mcg by mouth daily before breakfast.     metoprolol 50 MG tablet  Commonly known as:  LOPRESSOR  Take 75 mg by mouth 2 (two) times daily.     rifampin 300 MG capsule  Commonly known as:  RIFADIN  Take 1 capsule (300 mg total) by mouth 3 (three) times daily.     vancomycin 50 mg/mL oral solution  Commonly known as:  VANCOCIN  Take 10 mLs (500 mg total) by mouth every 6 (six) hours.       Allergies  Allergen Reactions  . Captopril     REACTION: unspecified  . Enalapril Maleate     REACTION: cough  . Nitrofurantoin     REACTION: itching  .  Ramipril     REACTION: unspecified  . Sulfa Antibiotics Other (See Comments)    Reaction unknown  . Verapamil     REACTION: unspecified      The results of significant diagnostics from this hospitalization (including imaging, microbiology, ancillary and laboratory) are listed below for reference.    Significant Diagnostic Studies: Dg Chest 2 View  11/18/2012   CLINICAL DATA:  Right-sided chest pain  EXAM: CHEST  2 VIEW  COMPARISON:  06/18/2011  FINDINGS: Changes from cardiac surgery are stable. The cardiac silhouette is mildly enlarged. The mediastinum is normal in contour. No hilar masses.  Stable scarring at the lung apices. The lungs are hyperexpanded but otherwise clear. No pleural effusion or pneumothorax.  Bony thorax is demineralized but grossly intact.  IMPRESSION: No acute cardiopulmonary disease.   Electronically Signed   By: Amie Portland M.D.   On: 11/18/2012 14:16   Dg Chest Port 1 View  11/20/2012   CLINICAL DATA:  Fever  EXAM: PORTABLE CHEST - 1 VIEW  COMPARISON:  11/18/2012  FINDINGS: Prosthetic cardiac valve also evident. Negative for CHF or pneumonia. No effusion or pneumothorax. Chronic apical scarring. No significant interval change.  IMPRESSION: Stable chest exam.  No superimposed acute process   Electronically Signed   By: Ruel Favors M.D.   On: 11/20/2012 14:31   Dg Abd Portable 2v  11/26/2012   CLINICAL DATA:  Ileus  EXAM: PORTABLE ABDOMEN - 2 VIEW  COMPARISON:  06/10/2010  FINDINGS: Nonobstructive bowel gas pattern. No evidence of ileus. No organomegaly or suspicious calcification. Vascular calcifications without visible aneurysm. No free air organomegaly. Scoliosis and degenerative changes in  the lumbar spine.  IMPRESSION: No evidence of obstruction or ileus.   Electronically Signed   By: Charlett Nose M.D.   On: 11/26/2012 20:38    Microbiology: Recent Results (from the past 240 hour(s))  URINE CULTURE     Status: None   Collection Time    11/27/12  6:47 PM       Result Value Range Status   Specimen Description URINE, CATHETERIZED   Final   Special Requests NONE   Final   Culture  Setup Time     Final   Value: 11/28/2012 00:11     Performed at [facility] Count     Final   Value: >=100,000 COLONIES/ML     Performed at [facility]   Culture     Final   Value: YEAST     Performed at [facility]   Report Status 11/29/2012 FINAL   Final     Labs: Basic Metabolic Panel:  Recent Labs Lab 12/02/12 0610 12/02/12 1610 12/03/12 0725 12/04/12 0540 12/05/12 0924  NA 130* 129* 137 133* 132*  K 5.1 5.5* 3.3* 3.2* 3.2*  CL 89* 86* 95* 91* 90*  CO2 16* 13* 27 23 23   GLUCOSE 78 97 134* 132* 143*  BUN 37* 43* 18 24* 33*  CREATININE 5.45* 5.88* 3.14* 4.50* 5.58*  CALCIUM 8.2* 8.0* 8.3* 8.6 8.5  PHOS  --  10.7*  --  5.5* 6.1*   Liver Function Tests:  Recent Labs Lab 12/02/12 1610 12/04/12 0540 12/05/12 0924  ALBUMIN 1.9* 1.8* 1.8*   No results found for this basename: LIPASE, AMYLASE,  in the last 168 hours No results found for this basename: AMMONIA,  in the last 168 hours CBC:  Recent Labs Lab 12/01/12 0533 12/02/12 0610 12/03/12 0725 12/04/12 0540 12/05/12 0924  WBC 18.7* 35.5* 20.5* 13.8* 13.8*  HGB 8.8* 9.2* 9.1* 8.2* 8.6*  HCT 27.0* 27.9* 27.7* 24.8* 25.6*  MCV 103.4* 103.0* 103.0* 101.6* 100.4*  PLT 177 132* 192 156 142*   Cardiac Enzymes: No results found for this basename: CKTOTAL, CKMB, CKMBINDEX, TROPONINI,  in the last 168 hours BNP: BNP (last 3 results) No results found for this basename: PROBNP,  in the last 8760 hours CBG:  Recent Labs Lab 12/05/12 0751 12/05/12 1352 12/05/12 1652 12/05/12 2219 12/06/12 0738  GLUCAP 122* 85 226* 140* 101*       Signed:  Kaedyn Belardo  [facility] 12/06/2012, 9:00 AM

## 2012-12-06 NOTE — Evaluation (Signed)
Occupational Therapy Evaluation Patient Details Name: [name] MRN: [id number] DOB: 1920/01/25 Today's Date: 12/06/2012 Time: 4098-1191 OT Time Calculation (min): 27 min  OT Assessment / Plan / Recommendation History of present illness pt is a 79 y.o. female adm secondary to n/v.    Clinical Impression   Pt demos decline in function with ADLs and ADL mobility safety with decreased strength, balance and endurance. Pt would benefit from acute OT services to address impairments to increase level of function and safety    OT Assessment  Patient needs continued OT Services    Follow Up Recommendations  SNF    Barriers to Discharge Decreased caregiver support    Equipment Recommendations       Recommendations for Other Services    Frequency  Min 2X/week    Precautions / Restrictions Precautions Precautions: Fall Precaution Comments: reports she has a recent fall per her son Restrictions Weight Bearing Restrictions: No   Pertinent Vitals/Pain No c/o pain    ADL  Grooming: Performed;Wash/dry hands;Wash/dry face;Minimal assistance;Moderate assistance Where Assessed - Grooming: Supported standing Upper Body Bathing: Simulated;Minimal assistance Lower Body Bathing: Simulated;Maximal assistance Upper Body Dressing: Performed;Minimal assistance Lower Body Dressing: Performed;Maximal assistance Toilet Transfer: Performed;Moderate assistance Toilet Transfer Method: Sit to stand Toilet Transfer Equipment: Bedside commode Toileting - Clothing Manipulation and Hygiene: Minimal assistance Where Assessed - Engineer, mining and Hygiene: Standing Tub/Shower Transfer Method: Not assessed Equipment Used: Gait belt;Rolling walker;Other (comment) (BSC) Transfers/Ambulation Related to ADLs: verbal and tactiel cues fro safety ADL Comments: pt fatigues easlily, fearful of falling    OT Diagnosis: Generalized weakness  OT Problem List: Decreased strength;Decreased knowledge  of use of DME or AE;Decreased activity tolerance;Impaired balance (sitting and/or standing);Decreased safety awareness OT Treatment Interventions: Self-care/ADL training;Therapeutic exercise;Patient/family education;Neuromuscular education;Balance training;Therapeutic activities;DME and/or AE instruction   OT Goals(Current goals can be found in the care plan section) Acute Rehab OT Goals Patient Stated Goal: go to rehab then go home OT Goal Formulation: With patient Time For Goal Achievement: 12/13/12 Potential to Achieve Goals: Good ADL Goals Pt Will Perform Grooming: with min assist;with min guard assist;standing Pt Will Perform Upper Body Bathing: with min guard assist;with supervision;with set-up;sitting;standing Pt Will Perform Lower Body Bathing: with mod assist Pt Will Perform Upper Body Dressing: with min guard assist;with supervision;with set-up;sitting;standing Pt Will Transfer to Toilet: bedside commode;ambulating;regular height toilet;grab bars;with min assist Pt Will Perform Toileting - Clothing Manipulation and hygiene: with min assist;sit to/from stand;sitting/lateral leans  Visit Information  Last OT Received On: 12/06/12 Assistance Needed: +1 History of Present Illness: pt is a 79 y.o. female adm secondary to n/v.        Prior Functioning     Home Living Family/patient expects to be discharged to:: Private residence Living Arrangements: Alone Available Help at Discharge: Family;Available PRN/intermittently Type of Home: House Home Access: Stairs to enter Entrance Stairs-Number of Steps: 4 Home Layout: Two level Home Equipment: Walker - 2 wheels;Cane - single point;Wheelchair - manual Additional Comments: Pt reports she was able to ambulate down to basement and back up to main floor with hand rails  Prior Function Level of Independence: Independent Comments: per pt, PTA pt was independent and did not require any AD for mobility Communication Communication: No  difficulties Dominant Hand: Right         Vision/Perception Vision - History Baseline Vision: Wears glasses all the time Patient Visual Report: No change from baseline Perception Perception: Within Functional Limits   Cognition  Cognition Arousal/Alertness: Awake/alert Behavior During  Therapy: WFL for tasks assessed/performed Overall Cognitive Status: No family/caregiver present to determine baseline cognitive functioning Area of Impairment: Safety/judgement Current Attention Level: Focused Following Commands: Follows one step commands inconsistently Safety/Judgement: Decreased awareness of deficits;Decreased awareness of safety Problem Solving: Decreased initiation;Slow processing;Difficulty sequencing;Requires verbal cues;Requires tactile cues General Comments: no family member present to determine baseline     Extremity/Trunk Assessment Upper Extremity Assessment Upper Extremity Assessment: Overall WFL for tasks assessed;Generalized weakness Lower Extremity Assessment Lower Extremity Assessment: Defer to PT evaluation Cervical / Trunk Assessment Cervical / Trunk Assessment: Normal     Mobility Bed Mobility Bed Mobility: Supine to Sit;Sitting - Scoot to [place] of Bed;Sit to Supine;Scooting to [facility] Supine to Sit: 4: Min assist;HOB elevated;With rails Sitting - Scoot to Edge of Bed: 4: Min assist Sit to Supine: 3: Mod assist;HOB flat Scooting to HOB: 3: Mod assist Transfers Transfers: Sit to Stand;Stand to Sit Sit to Stand: From bed;With upper extremity assist;From chair/3-in-1;3: Mod assist Stand to Sit: To bed;With upper extremity assist;To chair/3-in-1;3: Mod assist Details for Transfer Assistance: pt states that she feels like she is going to fall     Exercise     Balance Balance Balance Assessed: Yes Static Sitting Balance Static Sitting - Balance Support: Feet unsupported;Bilateral upper extremity supported Static Sitting - Level of Assistance: 5: Stand by  assistance Dynamic Sitting Balance Dynamic Sitting - Balance Support: No upper extremity supported;Feet unsupported;During functional activity Dynamic Sitting - Level of Assistance: 4: Min assist Dynamic Standing Balance Dynamic Standing - Balance Support: Left upper extremity supported;During functional activity Dynamic Standing - Level of Assistance: 3: Mod assist   End of Session OT - End of Session Equipment Utilized During Treatment: Gait belt;Rolling walker;Other (comment) (BSC) Activity Tolerance: Patient limited by fatigue Patient left: in bed;with call bell/phone within reach  GO     Galen Manila 12/06/2012, 12:58 PM

## 2012-12-06 NOTE — Progress Notes (Signed)
Subjective:  Unaware of any diarrhea, improving appetite, no complaints, likely discharge today to [facility] in [place]  Objective: Vital signs in last 24 hours: Temp:  [98.3 F (36.8 C)-99.3 F (37.4 C)] 98.4 F (36.9 C) (12/01 2100) Pulse Rate:  [85-118] 91 (12/01 2100) Resp:  [16-18] 16 (12/01 2100) BP: (85-168)/(44-72) 163/64 mmHg (12/01 2100) SpO2:  [97 %-98 %] 97 % (12/01 2100) Weight:  [50.3 kg (110 lb 14.3 oz)] 50.3 kg (110 lb 14.3 oz) (12/01 0840) Weight change:   Intake/Output from previous day: 12/01 0701 - 12/02 0700 In: 554.7 [P.O.:320; I.V.:134.7; IV Piggyback:100] Out: 2278    Lab Results:  Recent Labs  12/04/12 0540 12/05/12 0924  WBC 13.8* 13.8*  HGB 8.2* 8.6*  HCT 24.8* 25.6*  PLT 156 142*   BMET:  Recent Labs  12/04/12 0540 12/05/12 0924  NA 133* 132*  K 3.2* 3.2*  CL 91* 90*  CO2 23 23  GLUCOSE 132* 143*  BUN 24* 33*  CREATININE 4.50* 5.58*  CALCIUM 8.6 8.5  ALBUMIN 1.8* 1.8*   No results found for this basename: PTH,  in the last 72 hours Iron Studies:  Recent Labs  12/04/12 0540  IRON 147*  TIBC NOT CALC  FERRITIN >16500*   EXAM: General appearance:  Alert, in no apparent distress Resp:  CTA without rales, rhonchi, or wheezes Cardio:  RRR with Gr II/VI systolic murmur, no rub GI:  + BS, soft and nontender Extremities:  No edema Access:  AVF @ LUA with + bruit  Dialysis orders: MWF at [facility]  3hrs 50.5kg 2K/2.25Ca 400/800 L arm AVF Heparin 4200  Hectorol 4ug Epo 1800 Venofer none  Assessment/Plan: 1. MSSA bacteremia - 2/2 + BCs 11/18; Hx AVR, no vegetation per echo 11/18; AVF with no sign of infection; Ancef & Rifampin through 12/30, Gentamicin through 12/2.  2. C diff colitis - resolving, Vancomycin PO through 1/12.  3. UTI - culture + for yeast, on Diflucan PO.  4. ESRD - HD on MWF @ [facility]; K 3.2 pre-HD yesterday.  Next HD tomorrow.  5. HTN/Volume - BP 163/64 on Norvasc 5 mg qhs, Metoprolol 75 mg bid; wt  50.3 kg s/p net UF 2.3 L yesterday (below EDW). Decrease EDW.  6. Anemia - Hgb 8.6 on Aranesp 25 mcg on Mon.  7. Sec HPT - Ca 8.5 (10.8 corrected), P 6.1; Hectorol 4 mcg, Tums with meals.  8. Nutrition - Alb 1.8; now regular diet & vitamin, allowing son to bring food.      LOS: 16 days   LYLES,CHARLES 12/06/2012,7:03 AM  I have seen and examined patient, discussed with PA and agree with assessment and plan as outlined above. Vinson Moselle MD pager [phone]    cell [phone] 12/06/2012, 10:21 AM

## 2012-12-08 ENCOUNTER — Ambulatory Visit: Payer: Medicare Other | Admitting: Internal Medicine

## 2012-12-20 ENCOUNTER — Inpatient Hospital Stay: Payer: Medicare Other | Admitting: Internal Medicine

## 2012-12-22 ENCOUNTER — Telehealth: Payer: Self-pay | Admitting: *Deleted

## 2012-12-22 NOTE — Telephone Encounter (Signed)
Lmom to call our office. Time to schedule a Carotid u/s.

## 2013-01-24 ENCOUNTER — Ambulatory Visit (INDEPENDENT_AMBULATORY_CARE_PROVIDER_SITE_OTHER): Payer: Medicare Other | Admitting: Internal Medicine

## 2013-01-24 ENCOUNTER — Encounter: Payer: Self-pay | Admitting: Internal Medicine

## 2013-01-24 VITALS — BP 158/60 | HR 90 | Temp 97.5°F | Wt 106.0 lb

## 2013-01-24 DIAGNOSIS — A4101 Sepsis due to Methicillin susceptible Staphylococcus aureus: Secondary | ICD-10-CM

## 2013-01-24 DIAGNOSIS — I10 Essential (primary) hypertension: Secondary | ICD-10-CM

## 2013-01-24 DIAGNOSIS — E039 Hypothyroidism, unspecified: Secondary | ICD-10-CM

## 2013-01-24 DIAGNOSIS — A419 Sepsis, unspecified organism: Secondary | ICD-10-CM

## 2013-01-24 DIAGNOSIS — E1129 Type 2 diabetes mellitus with other diabetic kidney complication: Secondary | ICD-10-CM

## 2013-01-24 DIAGNOSIS — N186 End stage renal disease: Secondary | ICD-10-CM

## 2013-01-24 MED ORDER — NYSTATIN 100000 UNIT/GM EX POWD: 1.0000 g | Freq: Every day | CUTANEOUS | Status: DC | PRN

## 2013-01-24 NOTE — Assessment & Plan Note (Signed)
Still on dialysis

## 2013-01-24 NOTE — Progress Notes (Signed)
Subjective:    Patient ID: [name], female    DOB: 03-19-1920, 80 y.o.   MRN: [id number]  HPI Here with aide  Hospitalized in November--had Staph aureus sepsis Finished IV antibiotics via dialysis (ancef, gent, rifampin) Was in [place] till last week Waiting for in home therapy Son is there or aide daily. She stays home at night  Feels okay but off at times No pain Breathing is okay now--did use oxygen at times in rehab  Continues on 3x/week dialysis Tolerating this okay  No longer on insulin Gets sugars checked at dialysis--hasn't needed insulin there either Appetite is slightly better since being home  Current Outpatient Prescriptions on File Prior to Visit  Medication Sig Dispense Refill  . amLODipine (NORVASC) 5 MG tablet Take 1 tablet (5 mg total) by mouth every evening.      Marland Kitchen b complex-vitamin c-folic acid (NEPHRO-VITE) 0.8 MG TABS Take 0.8 mg by mouth at bedtime.      . calcium carbonate (TUMS - DOSED IN MG ELEMENTAL CALCIUM) 500 MG chewable tablet Chew 1 tablet (200 mg of elemental calcium total) by mouth 3 (three) times daily with meals.      . famotidine (PEPCID) 20 MG tablet Take 1 tablet (20 mg total) by mouth daily.      . folic acid (FOLVITE) 0.5 MG tablet Take 0.5 tablets (0.5 mg total) by mouth daily.      Marland Kitchen HYDROcodone-acetaminophen (NORCO/VICODIN) 5-325 MG per tablet Take 1 tablet by mouth every 6 (six) hours as needed for moderate pain.  10 tablet  0  . levothyroxine (SYNTHROID, LEVOTHROID) 75 MCG tablet Take 75 mcg by mouth daily before breakfast.      . vitamin C (VITAMIN C) 500 MG tablet Take 1 tablet (500 mg total) by mouth daily.       No current facility-administered medications on file prior to visit.    Allergies  Allergen Reactions  . Captopril     REACTION: unspecified  . Enalapril Maleate     REACTION: cough  . Nitrofurantoin     REACTION: itching  . Ramipril     REACTION: unspecified  . Sulfa Antibiotics Other (See  Comments)    Reaction unknown  . Verapamil     REACTION: unspecified    Past Medical History  Diagnosis Date  . Anemia     NOS  . Personal history of colonic polyps   . GERD (gastroesophageal reflux disease)   . Hyperlipidemia     takes Simvastatin nightly  . Osteoarthritis   . Osteopenia   . Urinary incontinence   . Pulmonary hypertension   . Anxiety   . Renal insufficiency   . Coronary artery disease   . Aortic stenosis   . Hyperlipidemia   . Hypertension     takes Metoprolol daily  . Peripheral vascular disease   . CHF (congestive heart failure)   . Migraine     hx of  . Constipation   . Diarrhea   . Oligouria   . History of blood transfusion   . Diabetes mellitus     type II;was on Glipizide but has been off x 11mon  . Hypothyroidism     takes Synthroid daily  . Cancer     Mole on top of head to be removed in July 2013    Past Surgical History  Procedure Laterality Date  . Abdominal hysterectomy    . Tonsillectomy    . Cardiac catheterization  2000  cad  . Replacement total knee bilateral  05/1998  . Cataract extraction    . Coronary artery bypass graft      od  . Adenosine myoview  2007    benign, EF 69%  . Carotid endarterectomy  2011    Right  . Coronary artery bypass graft  2009  . Aortic valve replacement  2009  . Amputation-left great toe  7/12    Dr Marlou Sa  . Traumatic amputation of right dip joint of index finger    . Insertion of dialysis catheter  12/18/2010    Procedure: INSERTION OF DIALYSIS CATHETER;  Surgeon: Mal Misty, MD;  Location: [place];  Service: Vascular;  Laterality: Right;  . Av fistula placement  12/22/2010    Procedure: ARTERIOVENOUS (AV) FISTULA CREATION;  Surgeon: Hinda Lenis, MD;  Location: [place];  Service: Vascular;  Laterality: Left;  Creation of Left Brachial-Cephalic Fistula  . Appendectomy    . Eye surgery      BIlateral  . Amputation  06/18/2011    Procedure: AMPUTATION DIGIT;  Surgeon: Newt Minion,  MD;  Location: [place];  Service: Orthopedics;  Laterality: Left;  Left 2nd Toe Amputation at MTP Joint    No family history on file.  History   Social History  . Marital Status: Widowed    Spouse Name: [name]    Number of Children: 1  . Years of Education: [name]   Occupational History  . retired Teacher, adult education [employer]    Social History Main Topics  . Smoking status: Former Smoker -- 15 years    Types: Cigarettes    Quit date: 01/06/1943  . Smokeless tobacco: Never Used  . Alcohol Use: Yes     Comment: Wine occasionally  . Drug Use: No  . Sexual Activity: No   Other Topics Concern  . Not on file   Social History Narrative   Retired - Software engineer, accts receivable   Review of Systems Weight is down a few pounds from this illness Still on protein drink Sleeping okay Under breast fungal rash--better with nystatin powder (almost gone)    Objective:   Physical Exam  Constitutional: She appears well-developed. No distress.  Neck: Normal range of motion. Neck supple. No thyromegaly present.  Cardiovascular: Normal rate and regular rhythm.  Exam reveals no gallop.   Soft mitral systolic murmur  Pulmonary/Chest: Effort normal and breath sounds normal. No respiratory distress. She has no wheezes. She has no rales.  Abdominal: Soft. There is no tenderness.  Musculoskeletal: She exhibits no edema and no tenderness.  Lymphadenopathy:    She has no cervical adenopathy.  Psychiatric: She has a normal mood and affect. Her behavior is normal.          Assessment & Plan:

## 2013-01-24 NOTE — Assessment & Plan Note (Signed)
Resolved Finished 6 weeks parenteral antibiotics Home from rehab now

## 2013-01-24 NOTE — Progress Notes (Signed)
Pre-visit discussion using our clinic review tool. No additional management support is needed unless otherwise documented below in the visit note.  

## 2013-01-24 NOTE — Assessment & Plan Note (Addendum)
No longer on insulin Will check A1c again next time Lab Results  Component Value Date   HGBA1C 6.2* 11/20/2012

## 2013-01-24 NOTE — Assessment & Plan Note (Addendum)
Will check labs next time

## 2013-01-24 NOTE — Assessment & Plan Note (Signed)
BP Readings from Last 3 Encounters:  01/24/13 158/60  12/06/12 162/50  12/06/12 162/50   Stable Okay for her No changes

## 2013-01-27 ENCOUNTER — Telehealth: Payer: Self-pay

## 2013-01-27 NOTE — Telephone Encounter (Signed)
Relevant patient education mailed to patient.  

## 2013-02-03 ENCOUNTER — Other Ambulatory Visit: Payer: Self-pay | Admitting: Family Medicine

## 2013-02-03 MED ORDER — AMLODIPINE BESYLATE 5 MG PO TABS: 5.0000 mg | ORAL_TABLET | Freq: Every evening | ORAL | Status: DC

## 2013-02-08 ENCOUNTER — Telehealth: Payer: Self-pay | Admitting: Internal Medicine

## 2013-02-08 NOTE — Telephone Encounter (Signed)
Relevant patient education mailed to patient.  

## 2013-03-18 ENCOUNTER — Other Ambulatory Visit: Payer: Self-pay | Admitting: Cardiology

## 2013-03-20 ENCOUNTER — Emergency Department (HOSPITAL_COMMUNITY)
Admission: EM | Admit: 2013-03-20 | Discharge: 2013-03-21 | Disposition: A | Discharge status: Home / Self Care | Payer: Medicare Other | Attending: Emergency Medicine | Admitting: Emergency Medicine

## 2013-03-20 ENCOUNTER — Encounter (HOSPITAL_COMMUNITY): Payer: Self-pay | Admitting: Emergency Medicine

## 2013-03-20 ENCOUNTER — Emergency Department (HOSPITAL_COMMUNITY): Payer: Medicare Other

## 2013-03-20 DIAGNOSIS — N289 Disorder of kidney and ureter, unspecified: Secondary | ICD-10-CM | POA: Insufficient documentation

## 2013-03-20 DIAGNOSIS — D649 Anemia, unspecified: Secondary | ICD-10-CM | POA: Insufficient documentation

## 2013-03-20 DIAGNOSIS — M899 Disorder of bone, unspecified: Secondary | ICD-10-CM | POA: Insufficient documentation

## 2013-03-20 DIAGNOSIS — M1990 Unspecified osteoarthritis, unspecified site: Secondary | ICD-10-CM | POA: Insufficient documentation

## 2013-03-20 DIAGNOSIS — I509 Heart failure, unspecified: Secondary | ICD-10-CM | POA: Insufficient documentation

## 2013-03-20 DIAGNOSIS — K5900 Constipation, unspecified: Secondary | ICD-10-CM | POA: Insufficient documentation

## 2013-03-20 DIAGNOSIS — Z7982 Long term (current) use of aspirin: Secondary | ICD-10-CM | POA: Insufficient documentation

## 2013-03-20 DIAGNOSIS — K219 Gastro-esophageal reflux disease without esophagitis: Secondary | ICD-10-CM | POA: Insufficient documentation

## 2013-03-20 DIAGNOSIS — Z860100 Personal history of colon polyps, unspecified: Secondary | ICD-10-CM | POA: Insufficient documentation

## 2013-03-20 DIAGNOSIS — R32 Unspecified urinary incontinence: Secondary | ICD-10-CM | POA: Insufficient documentation

## 2013-03-20 DIAGNOSIS — E785 Hyperlipidemia, unspecified: Secondary | ICD-10-CM | POA: Insufficient documentation

## 2013-03-20 DIAGNOSIS — R4182 Altered mental status, unspecified: Secondary | ICD-10-CM

## 2013-03-20 DIAGNOSIS — Z888 Allergy status to other drugs, medicaments and biological substances status: Secondary | ICD-10-CM | POA: Insufficient documentation

## 2013-03-20 DIAGNOSIS — M949 Disorder of cartilage, unspecified: Secondary | ICD-10-CM

## 2013-03-20 DIAGNOSIS — Z79899 Other long term (current) drug therapy: Secondary | ICD-10-CM | POA: Insufficient documentation

## 2013-03-20 DIAGNOSIS — Z881 Allergy status to other antibiotic agents status: Secondary | ICD-10-CM | POA: Insufficient documentation

## 2013-03-20 DIAGNOSIS — I2510 Atherosclerotic heart disease of native coronary artery without angina pectoris: Secondary | ICD-10-CM | POA: Insufficient documentation

## 2013-03-20 DIAGNOSIS — F411 Generalized anxiety disorder: Secondary | ICD-10-CM | POA: Insufficient documentation

## 2013-03-20 DIAGNOSIS — I2789 Other specified pulmonary heart diseases: Secondary | ICD-10-CM | POA: Insufficient documentation

## 2013-03-20 DIAGNOSIS — Z882 Allergy status to sulfonamides status: Secondary | ICD-10-CM | POA: Insufficient documentation

## 2013-03-20 DIAGNOSIS — Z87891 Personal history of nicotine dependence: Secondary | ICD-10-CM | POA: Insufficient documentation

## 2013-03-20 DIAGNOSIS — E119 Type 2 diabetes mellitus without complications: Secondary | ICD-10-CM | POA: Insufficient documentation

## 2013-03-20 DIAGNOSIS — E039 Hypothyroidism, unspecified: Secondary | ICD-10-CM | POA: Insufficient documentation

## 2013-03-20 DIAGNOSIS — N390 Urinary tract infection, site not specified: Secondary | ICD-10-CM

## 2013-03-20 DIAGNOSIS — G43909 Migraine, unspecified, not intractable, without status migrainosus: Secondary | ICD-10-CM | POA: Insufficient documentation

## 2013-03-20 DIAGNOSIS — I739 Peripheral vascular disease, unspecified: Secondary | ICD-10-CM | POA: Insufficient documentation

## 2013-03-20 LAB — BASIC METABOLIC PANEL
BUN: 34 mg/dL — ABNORMAL HIGH (ref 6–23)
CALCIUM: 10 mg/dL (ref 8.4–10.5)
CO2: 26 meq/L (ref 19–32)
Chloride: 90 mEq/L — ABNORMAL LOW (ref 96–112)
Creatinine, Ser: 2.29 mg/dL — ABNORMAL HIGH (ref 0.50–1.10)
GFR calc Af Amer: 20 mL/min — ABNORMAL LOW (ref >90)
GFR calc non Af Amer: 18 mL/min — ABNORMAL LOW (ref >90)
Glucose, Bld: 111 mg/dL — ABNORMAL HIGH (ref 70–99)
POTASSIUM: 5.1 meq/L (ref 3.7–5.3)
SODIUM: 135 meq/L — AB (ref 137–147)

## 2013-03-20 LAB — CBC
HCT: 38.7 % (ref 36.0–46.0)
HEMOGLOBIN: 13 g/dL (ref 12.0–15.0)
MCH: 32.1 pg (ref 26.0–34.0)
MCHC: 33.6 g/dL (ref 30.0–36.0)
MCV: 95.6 fL (ref 78.0–100.0)
PLATELETS: 276 10*3/uL (ref 150–400)
RBC: 4.05 MIL/uL (ref 3.87–5.11)
RDW: 17.2 % — ABNORMAL HIGH (ref 11.5–15.5)
WBC: 8.9 10*3/uL (ref 4.0–10.5)

## 2013-03-20 LAB — I-STAT TROPONIN, ED: Troponin i, poc: 0.02 ng/mL (ref 0.00–0.08)

## 2013-03-20 LAB — CBG MONITORING, ED: GLUCOSE-CAPILLARY: 115 mg/dL — AB (ref 70–99)

## 2013-03-20 NOTE — ED Notes (Signed)
Pt lives alone; is dialysis patient; today the [facility] [facility] driver had to practically carry her out of [facility] due to patient being weak and lethargic. Son reports pt is confused, answers "I don't know" a lot. Has hx of UTIs. Pt did not take her medicine today. Pt answer my questions appropriately. Pt appears SOB. No neuro deficits. Reports intermittent pain to right side of chest, denies at current.

## 2013-03-20 NOTE — ED Notes (Signed)
Pt in visible site of nurse first. Family sitting with her.

## 2013-03-21 ENCOUNTER — Emergency Department (HOSPITAL_COMMUNITY): Payer: Medicare Other

## 2013-03-21 LAB — URINALYSIS, ROUTINE W REFLEX MICROSCOPIC
Bilirubin Urine: NEGATIVE
Glucose, UA: NEGATIVE mg/dL
HGB URINE DIPSTICK: NEGATIVE
Ketones, ur: NEGATIVE mg/dL
NITRITE: NEGATIVE
SPECIFIC GRAVITY, URINE: 1.015 (ref 1.005–1.030)
UROBILINOGEN UA: 0.2 mg/dL (ref 0.0–1.0)
pH: 7.5 (ref 5.0–8.0)

## 2013-03-21 LAB — URINE MICROSCOPIC-ADD ON

## 2013-03-21 LAB — PRO B NATRIURETIC PEPTIDE: PRO B NATRI PEPTIDE: 21236 pg/mL — AB (ref 0–450)

## 2013-03-21 MED ORDER — CIPROFLOXACIN HCL 500 MG PO TABS
500.0000 mg | ORAL_TABLET | Freq: Two times a day (BID) | ORAL | Status: DC
Start: 2013-03-21 — End: 2013-03-26

## 2013-03-21 NOTE — ED Provider Notes (Signed)
CSN: [id number]     Arrival date & time 03/20/13  1813 History   First MD Initiated Contact with Patient 03/20/13 2338     Chief Complaint  Patient presents with  . Altered Mental Status     (Consider location/radiation/quality/duration/timing/severity/associated sxs/prior Treatment) HPI Comments: Patient is a 80 year old female with history of coronary artery disease/bypass surgery, end-stage renal disease on hemodialysis. The patient went to dialysis today and the [facility] driver had to practically carry her into the house when the session was over. She was extremely weak and appeared confused. Dialysis called the son and informed him his mother was not acting quite right. He went to check on her this evening and she appeared confused and somewhat disoriented. She has had urinary tract infections in the past and she is behaving this way with those. She denies any specific complaints such as headache, chest pain, difficulty breathing. She denies any injury or trauma.  Patient is a 80 y.o. female presenting with altered mental status. The history is provided by the patient.  Altered Mental Status Presenting symptoms: confusion   Presenting symptoms comment:  Weakness Severity:  Moderate Most recent episode:  Today Episode history:  Single Timing:  Constant Progression:  Partially resolved Chronicity:  New   Past Medical History  Diagnosis Date  . Anemia     NOS  . Personal history of colonic polyps   . GERD (gastroesophageal reflux disease)   . Hyperlipidemia     takes Simvastatin nightly  . Osteoarthritis   . Osteopenia   . Urinary incontinence   . Pulmonary hypertension   . Anxiety   . Renal insufficiency   . Coronary artery disease   . Aortic stenosis   . Hyperlipidemia   . Hypertension     takes Metoprolol daily  . Peripheral vascular disease   . CHF (congestive heart failure)   . Migraine     hx of  . Constipation   . Diarrhea   . Oligouria   . History of blood  transfusion   . Diabetes mellitus     type II;was on Glipizide but has been off x 62mon  . Hypothyroidism     takes Synthroid daily  . Cancer     Mole on top of head to be removed in July 2013   Past Surgical History  Procedure Laterality Date  . Abdominal hysterectomy    . Tonsillectomy    . Cardiac catheterization  2000    cad  . Replacement total knee bilateral  05/1998  . Cataract extraction    . Coronary artery bypass graft      od  . Adenosine myoview  2007    benign, EF 69%  . Carotid endarterectomy  2011    Right  . Coronary artery bypass graft  2009  . Aortic valve replacement  2009  . Amputation-left great toe  7/12    Dr Marlou Sa  . Traumatic amputation of right dip joint of index finger    . Insertion of dialysis catheter  12/18/2010    Procedure: INSERTION OF DIALYSIS CATHETER;  Surgeon: Mal Misty, MD;  Location: [place];  Service: Vascular;  Laterality: Right;  . Av fistula placement  12/22/2010    Procedure: ARTERIOVENOUS (AV) FISTULA CREATION;  Surgeon: Hinda Lenis, MD;  Location: [place];  Service: Vascular;  Laterality: Left;  Creation of Left Brachial-Cephalic Fistula  . Appendectomy    . Eye surgery      BIlateral  .  Amputation  06/18/2011    Procedure: AMPUTATION DIGIT;  Surgeon: Newt Minion, MD;  Location: [place];  Service: Orthopedics;  Laterality: Left;  Left 2nd Toe Amputation at MTP Joint   No family history on file. History  Substance Use Topics  . Smoking status: Former Smoker -- 15 years    Types: Cigarettes    Quit date: 01/06/1943  . Smokeless tobacco: Never Used  . Alcohol Use: Yes     Comment: Wine occasionally   OB History   Grav Para Term Preterm Abortions TAB SAB Ect Mult Living                 Review of Systems  Psychiatric/Behavioral: Positive for confusion.  All other systems reviewed and are negative.      Allergies  Nitrofurantoin; Captopril; Enalapril maleate; Ramipril; Sulfa antibiotics; and Verapamil  Home  Medications   Current Outpatient Rx  Name  Route  Sig  Dispense  Refill  . aspirin EC 81 MG tablet   Oral   Take 81 mg by mouth at bedtime.         Marland Kitchen b complex-vitamin c-folic acid (NEPHRO-VITE) 0.8 MG TABS   Oral   Take 0.8 mg by mouth every Monday, Wednesday, and Friday.          . calcium carbonate (TUMS - DOSED IN MG ELEMENTAL CALCIUM) 500 MG chewable tablet   Oral   Chew 1 tablet (200 mg of elemental calcium total) by mouth 3 (three) times daily with meals.         . folic acid (FOLVITE) 0.5 MG tablet   Oral   Take 0.5 tablets (0.5 mg total) by mouth daily.         Marland Kitchen levothyroxine (SYNTHROID, LEVOTHROID) 75 MCG tablet   Oral   Take 75 mcg by mouth daily before breakfast.         . metoprolol (LOPRESSOR) 50 MG tablet   Oral   Take 75 mg by mouth 2 (two) times daily.         . pravastatin (PRAVACHOL) 10 MG tablet   Oral   Take 10 mg by mouth at bedtime.         . vitamin C (VITAMIN C) 500 MG tablet   Oral   Take 1 tablet (500 mg total) by mouth daily.          BP 217/67  Pulse 86  Temp(Src) 97.7 F (36.5 C) (Oral)  Resp 17  SpO2 99% Physical Exam  Nursing note and vitals reviewed. Constitutional: She is oriented to person, place, and time. She appears well-developed and well-nourished. No distress.  Patient is an elderly female in no acute distress. She is awake alert and appropriate.  HENT:  Head: Normocephalic and atraumatic.  Mouth/Throat: Oropharynx is clear and moist.  Neck: Normal range of motion. Neck supple.  Cardiovascular: Normal rate and regular rhythm.  Exam reveals no gallop and no friction rub.   No murmur heard. Pulmonary/Chest: Effort normal and breath sounds normal. No respiratory distress. She has no wheezes.  Abdominal: Soft. Bowel sounds are normal. She exhibits no distension. There is no tenderness.  Musculoskeletal: Normal range of motion.  Neurological: She is alert and oriented to person, place, and time.  Skin: Skin  is warm and dry. She is not diaphoretic.    ED Course  Procedures (including critical care time) Labs Review Labs Reviewed  CBC - Abnormal; Notable for the following:    RDW 17.2 (*)  All other components within normal limits  BASIC METABOLIC PANEL - Abnormal; Notable for the following:    Sodium 135 (*)    Chloride 90 (*)    Glucose, Bld 111 (*)    BUN 34 (*)    Creatinine, Ser 2.29 (*)    GFR calc non Af Amer 18 (*)    GFR calc Af Amer 20 (*)    All other components within normal limits  CBG MONITORING, ED - Abnormal; Notable for the following:    Glucose-Capillary 115 (*)    All other components within normal limits  PRO B NATRIURETIC PEPTIDE  URINALYSIS, ROUTINE W REFLEX MICROSCOPIC  I-STAT TROPOININ, ED   Imaging Review Dg Chest 2 View  03/20/2013   CLINICAL DATA:  Altered mental status.  EXAM: CHEST  2 VIEW  COMPARISON:  November 20, 2012.  FINDINGS: Status post coronary artery bypass graft. Hyperinflation of the lungs is noted suggesting chronic obstructive pulmonary disease. No pleural effusion or pneumothorax is noted. No acute pulmonary disease is noted. Bony thorax is intact.  IMPRESSION: No acute cardiopulmonary abnormality seen.   Electronically Signed   By: Sabino Dick M.D.   On: 03/20/2013 20:03     EKG Interpretation   Date/Time:  Monday March 20 2013 18:54:34 EDT Ventricular Rate:  92 PR Interval:  206 QRS Duration: 92 QT Interval:  418 QTC Calculation: 516 R Axis:   153 Text Interpretation:  Normal sinus rhythm Septal infarct , age  undetermined Lateral infarct , age undetermined Prolonged QT Abnormal ECG  No significant change since 11/30/12 Confirmed by DELOS  MD, Ramy Greth  [phone]) on 03/21/2013 12:09:20 AM      MDM   Final diagnoses:  None    Patient brought for evaluation of weakness and confusion that occurred when she was transported home from dialysis. She was observed in the ER and over time her symptoms resolved. She was worked up with  blood work, head CT, and urinalysis. These were essentially her baseline with the exception of UA which revealed a UTI. She will be treated with antibiotics and discharged to home at her and her son's request. They understand to return if her symptoms worsen or change.    Veryl Speak, MD 03/21/13 [phone]

## 2013-03-21 NOTE — Discharge Instructions (Signed)
Altered Mental Status Altered mental status most often refers to an abnormal change in your responsiveness and awareness. It can affect your speech, thought, mobility, memory, attention span, or alertness. It can range from slight confusion to complete unresponsiveness (coma). Altered mental status can be a sign of a serious underlying medical condition. Rapid evaluation and medical treatment is necessary for patients having an altered mental status. CAUSES   Low blood sugar (hypoglycemia) or diabetes.  Severe loss of body fluids (dehydration) or a body salt (electrolyte) imbalance.  A stroke or other neurologic problem, such as dementia or delirium.  A head injury or tumor.  A drug or alcohol overdose.  Exposure to toxins or poisons.  Depression, anxiety, and stress.  A low oxygen level (hypoxia).  An infection.  Blood loss.  Twitching or shaking (seizure).  Heart problems, such as heart attack or heart rhythm problems (arrhythmias).  A body temperature that is too low or too high (hypothermia or hyperthermia). DIAGNOSIS  A diagnosis is based on your history, symptoms, physical and neurologic examinations, and diagnostic tests. Diagnostic tests may include:  Measurement of your blood pressure, pulse, breathing, and oxygen levels (vital signs).  Blood tests.  Urine tests.  X-ray exams.  A computerized magnetic scan (magnetic resonance imaging, MRI).  A computerized X-ray scan (computed tomography, CT scan). TREATMENT  Treatment will depend on the cause. Treatment may include:  Management of an underlying medical or mental health condition.  Critical care or support in the hospital. [place]   Only take over-the-counter or prescription medicines for pain, discomfort, or fever as directed by your caregiver.  Manage underlying conditions as directed by your caregiver.  Eat a healthy, well-balanced diet to maintain strength.  Join a support group or  prevention program to cope with the condition or trauma that caused the altered mental status. Ask your caregiver to help choose a program that works for you.  Follow up with your caregiver for further examination, therapy, or testing as directed. SEEK MEDICAL CARE IF:   You feel unwell or have chills.  You or your family notice a change in your behavior or your alertness.  You have trouble following your caregiver's treatment plan.  You have questions or concerns. SEEK IMMEDIATE MEDICAL CARE IF:   You have a rapid heartbeat or have chest pain.  You have difficulty breathing.  You have a fever.  You have a headache with a stiff neck.  You cough up blood.  You have blood in your urine or stool.  You have severe agitation or confusion. MAKE SURE YOU:   Understand these instructions.  Will watch your condition.  Will get help right away if you are not doing well or get worse. Document Released: 06/11/2009 Document Revised: 03/16/2011 Document Reviewed: 06/11/2009 [facility] Patient Information 2014 [place].  Urinary Tract Infection Urinary tract infections (UTIs) can develop anywhere along your urinary tract. Your urinary tract is your body's drainage system for removing wastes and extra water. Your urinary tract includes two kidneys, two ureters, a bladder, and a urethra. Your kidneys are a pair of bean-shaped organs. Each kidney is about the size of your fist. They are located below your ribs, one on each side of your spine. CAUSES Infections are caused by microbes, which are microscopic organisms, including fungi, viruses, and bacteria. These organisms are so small that they can only be seen through a microscope. Bacteria are the microbes that most commonly cause UTIs. SYMPTOMS  Symptoms of UTIs may  vary by age and gender of the patient and by the location of the infection. Symptoms in young women typically include a frequent and intense urge to urinate and a painful,  burning feeling in the bladder or urethra during urination. Older women and men are more likely to be tired, shaky, and weak and have muscle aches and abdominal pain. A fever may mean the infection is in your kidneys. Other symptoms of a kidney infection include pain in your back or sides below the ribs, nausea, and vomiting. DIAGNOSIS To diagnose a UTI, your caregiver will ask you about your symptoms. Your caregiver also will ask to provide a urine sample. The urine sample will be tested for bacteria and white blood cells. White blood cells are made by your body to help fight infection. TREATMENT  Typically, UTIs can be treated with medication. Because most UTIs are caused by a bacterial infection, they usually can be treated with the use of antibiotics. The choice of antibiotic and length of treatment depend on your symptoms and the type of bacteria causing your infection. HOME CARE INSTRUCTIONS  If you were prescribed antibiotics, take them exactly as your caregiver instructs you. Finish the medication even if you feel better after you have only taken some of the medication.  Drink enough water and fluids to keep your urine clear or pale yellow.  Avoid caffeine, tea, and carbonated beverages. They tend to irritate your bladder.  Empty your bladder often. Avoid holding urine for long periods of time.  Empty your bladder before and after sexual intercourse.  After a bowel movement, women should cleanse from front to back. Use each tissue only once. SEEK MEDICAL CARE IF:   You have back pain.  You develop a fever.  Your symptoms do not begin to resolve within 3 days. SEEK IMMEDIATE MEDICAL CARE IF:   You have severe back pain or lower abdominal pain.  You develop chills.  You have nausea or vomiting.  You have continued burning or discomfort with urination. MAKE SURE YOU:   Understand these instructions.  Will watch your condition.  Will get help right away if you are not  doing well or get worse. Document Released: 10/01/2004 Document Revised: 06/23/2011 Document Reviewed: 01/30/2011 [facility] Patient Information 2014 [place].

## 2013-03-22 ENCOUNTER — Emergency Department: Payer: Self-pay | Admitting: Emergency Medicine

## 2013-03-22 ENCOUNTER — Encounter (HOSPITAL_COMMUNITY): Payer: Self-pay | Admitting: Internal Medicine

## 2013-03-22 ENCOUNTER — Inpatient Hospital Stay (HOSPITAL_COMMUNITY)
Admission: AD | Admit: 2013-03-22 | Discharge: 2013-03-26 | DRG: 682 | Disposition: A | Discharge status: Home / Self Care | Payer: Medicare Other | Source: Other Acute Inpatient Hospital | Attending: Family Medicine | Admitting: Family Medicine

## 2013-03-22 ENCOUNTER — Telehealth: Payer: Self-pay | Admitting: Internal Medicine

## 2013-03-22 DIAGNOSIS — N2581 Secondary hyperparathyroidism of renal origin: Secondary | ICD-10-CM | POA: Diagnosis present

## 2013-03-22 DIAGNOSIS — R7989 Other specified abnormal findings of blood chemistry: Secondary | ICD-10-CM

## 2013-03-22 DIAGNOSIS — F411 Generalized anxiety disorder: Secondary | ICD-10-CM | POA: Diagnosis present

## 2013-03-22 DIAGNOSIS — S68118A Complete traumatic metacarpophalangeal amputation of other finger, initial encounter: Secondary | ICD-10-CM

## 2013-03-22 DIAGNOSIS — N390 Urinary tract infection, site not specified: Secondary | ICD-10-CM | POA: Diagnosis present

## 2013-03-22 DIAGNOSIS — R32 Unspecified urinary incontinence: Secondary | ICD-10-CM | POA: Diagnosis present

## 2013-03-22 DIAGNOSIS — D631 Anemia in chronic kidney disease: Secondary | ICD-10-CM

## 2013-03-22 DIAGNOSIS — I2510 Atherosclerotic heart disease of native coronary artery without angina pectoris: Secondary | ICD-10-CM | POA: Diagnosis present

## 2013-03-22 DIAGNOSIS — N189 Chronic kidney disease, unspecified: Secondary | ICD-10-CM

## 2013-03-22 DIAGNOSIS — I6529 Occlusion and stenosis of unspecified carotid artery: Secondary | ICD-10-CM

## 2013-03-22 DIAGNOSIS — D649 Anemia, unspecified: Secondary | ICD-10-CM | POA: Diagnosis present

## 2013-03-22 DIAGNOSIS — I10 Essential (primary) hypertension: Secondary | ICD-10-CM

## 2013-03-22 DIAGNOSIS — A4101 Sepsis due to Methicillin susceptible Staphylococcus aureus: Secondary | ICD-10-CM

## 2013-03-22 DIAGNOSIS — I359 Nonrheumatic aortic valve disorder, unspecified: Secondary | ICD-10-CM

## 2013-03-22 DIAGNOSIS — D696 Thrombocytopenia, unspecified: Secondary | ICD-10-CM

## 2013-03-22 DIAGNOSIS — Z87891 Personal history of nicotine dependence: Secondary | ICD-10-CM

## 2013-03-22 DIAGNOSIS — I771 Stricture of artery: Secondary | ICD-10-CM

## 2013-03-22 DIAGNOSIS — Z951 Presence of aortocoronary bypass graft: Secondary | ICD-10-CM

## 2013-03-22 DIAGNOSIS — E785 Hyperlipidemia, unspecified: Secondary | ICD-10-CM

## 2013-03-22 DIAGNOSIS — R778 Other specified abnormalities of plasma proteins: Secondary | ICD-10-CM

## 2013-03-22 DIAGNOSIS — I2789 Other specified pulmonary heart diseases: Secondary | ICD-10-CM | POA: Diagnosis present

## 2013-03-22 DIAGNOSIS — M1990 Unspecified osteoarthritis, unspecified site: Secondary | ICD-10-CM | POA: Diagnosis present

## 2013-03-22 DIAGNOSIS — M949 Disorder of cartilage, unspecified: Secondary | ICD-10-CM

## 2013-03-22 DIAGNOSIS — I509 Heart failure, unspecified: Secondary | ICD-10-CM | POA: Diagnosis present

## 2013-03-22 DIAGNOSIS — Z952 Presence of prosthetic heart valve: Secondary | ICD-10-CM

## 2013-03-22 DIAGNOSIS — E1129 Type 2 diabetes mellitus with other diabetic kidney complication: Secondary | ICD-10-CM

## 2013-03-22 DIAGNOSIS — Z992 Dependence on renal dialysis: Secondary | ICD-10-CM

## 2013-03-22 DIAGNOSIS — I498 Other specified cardiac arrhythmias: Secondary | ICD-10-CM | POA: Diagnosis present

## 2013-03-22 DIAGNOSIS — G9340 Encephalopathy, unspecified: Secondary | ICD-10-CM | POA: Diagnosis present

## 2013-03-22 DIAGNOSIS — I120 Hypertensive chronic kidney disease with stage 5 chronic kidney disease or end stage renal disease: Principal | ICD-10-CM | POA: Diagnosis present

## 2013-03-22 DIAGNOSIS — K219 Gastro-esophageal reflux disease without esophagitis: Secondary | ICD-10-CM | POA: Diagnosis present

## 2013-03-22 DIAGNOSIS — M899 Disorder of bone, unspecified: Secondary | ICD-10-CM | POA: Diagnosis present

## 2013-03-22 DIAGNOSIS — L280 Lichen simplex chronicus: Secondary | ICD-10-CM

## 2013-03-22 DIAGNOSIS — Z860100 Personal history of colon polyps, unspecified: Secondary | ICD-10-CM

## 2013-03-22 DIAGNOSIS — Z9849 Cataract extraction status, unspecified eye: Secondary | ICD-10-CM

## 2013-03-22 DIAGNOSIS — E039 Hypothyroidism, unspecified: Secondary | ICD-10-CM | POA: Diagnosis present

## 2013-03-22 DIAGNOSIS — I739 Peripheral vascular disease, unspecified: Secondary | ICD-10-CM | POA: Diagnosis present

## 2013-03-22 DIAGNOSIS — N186 End stage renal disease: Secondary | ICD-10-CM | POA: Diagnosis present

## 2013-03-22 DIAGNOSIS — Z953 Presence of xenogenic heart valve: Secondary | ICD-10-CM

## 2013-03-22 DIAGNOSIS — N058 Unspecified nephritic syndrome with other morphologic changes: Secondary | ICD-10-CM | POA: Diagnosis present

## 2013-03-22 LAB — COMPREHENSIVE METABOLIC PANEL
ALBUMIN: 3.4 g/dL (ref 3.4–5.0)
ALK PHOS: 199 U/L — AB
ALT: 30 U/L (ref 12–78)
ANION GAP: 9 (ref 7–16)
BUN: 20 mg/dL — ABNORMAL HIGH (ref 7–18)
Bilirubin,Total: 0.9 mg/dL (ref 0.2–1.0)
CO2: 28 mmol/L (ref 21–32)
Calcium, Total: 8.8 mg/dL (ref 8.5–10.1)
Chloride: 97 mmol/L — ABNORMAL LOW (ref 98–107)
Creatinine: 1.77 mg/dL — ABNORMAL HIGH (ref 0.60–1.30)
EGFR (African American): 29 — ABNORMAL LOW
EGFR (Non-African Amer.): 25 — ABNORMAL LOW
GLUCOSE: 86 mg/dL (ref 65–99)
OSMOLALITY: 270 (ref 275–301)
POTASSIUM: 3.6 mmol/L (ref 3.5–5.1)
SGOT(AST): 38 U/L — ABNORMAL HIGH (ref 15–37)
SODIUM: 134 mmol/L — AB (ref 136–145)
Total Protein: 9.2 g/dL — ABNORMAL HIGH (ref 6.4–8.2)

## 2013-03-22 LAB — MRSA PCR SCREENING: MRSA BY PCR: NEGATIVE

## 2013-03-22 LAB — ETHANOL
Ethanol %: <0.003 % (ref 0.000–0.080)
Ethanol: <3 mg/dL

## 2013-03-22 LAB — CBC
HCT: 36.5 % (ref 35.0–47.0)
HCT: 37 % (ref 36.0–46.0)
HEMOGLOBIN: 12 g/dL (ref 12.0–15.0)
HGB: 12.4 g/dL (ref 12.0–16.0)
MCH: 32.3 pg (ref 26.0–34.0)
MCH: 31.4 pg (ref 26.0–34.0)
MCHC: 33.9 g/dL (ref 32.0–36.0)
MCHC: 32.4 g/dL (ref 30.0–36.0)
MCV: 95 fL (ref 80–100)
MCV: 96.9 fL (ref 78.0–100.0)
Platelet: 245 10*3/uL (ref 150–440)
Platelets: 233 10*3/uL (ref 150–400)
RBC: 3.83 10*6/uL (ref 3.80–5.20)
RBC: 3.82 MIL/uL — AB (ref 3.87–5.11)
RDW: 17.5 % — ABNORMAL HIGH (ref 11.5–14.5)
RDW: 17.2 % — ABNORMAL HIGH (ref 11.5–15.5)
WBC: 8.3 10*3/uL (ref 3.6–11.0)
WBC: 8.1 10*3/uL (ref 4.0–10.5)

## 2013-03-22 LAB — URINALYSIS, COMPLETE
BILIRUBIN, UR: NEGATIVE
BLOOD: NEGATIVE
Glucose,UR: NEGATIVE mg/dL (ref 0–75)
Ketone: NEGATIVE
Nitrite: NEGATIVE
PH: 7 (ref 4.5–8.0)
Protein: 100
RBC,UR: 2 /HPF (ref 0–5)
Specific Gravity: 1.011 (ref 1.003–1.030)
Squamous Epithelial: NONE SEEN
WBC UR: 107 /HPF (ref 0–5)

## 2013-03-22 LAB — CREATININE, SERUM
Creatinine, Ser: 2.38 mg/dL — ABNORMAL HIGH (ref 0.50–1.10)
GFR calc Af Amer: 19 mL/min — ABNORMAL LOW (ref >90)
GFR calc non Af Amer: 17 mL/min — ABNORMAL LOW (ref >90)

## 2013-03-22 LAB — AMMONIA: Ammonia: 33 umol/L (ref 11–60)

## 2013-03-22 LAB — TROPONIN I
TROPONIN I: 0.37 ng/mL — AB (ref <0.30)
TROPONIN-I: 0.02 ng/mL

## 2013-03-22 LAB — CK: Total CK: 47 U/L (ref 7–177)

## 2013-03-22 MED ORDER — SIMVASTATIN 5 MG PO TABS
5.0000 mg | ORAL_TABLET | Freq: Every day | ORAL | Status: DC
  Administered 2013-03-23 – 2013-03-25 (×3): 5 mg via ORAL
  Filled 2013-03-22 (×4): qty 1

## 2013-03-22 MED ORDER — DEXTROSE 5 % IV SOLN
1.0000 g | INTRAVENOUS | Status: DC
  Administered 2013-03-22 – 2013-03-25 (×4): 1 g via INTRAVENOUS
  Filled 2013-03-22 (×5): qty 10

## 2013-03-22 MED ORDER — VITAMIN C 500 MG PO TABS
500.0000 mg | ORAL_TABLET | Freq: Every day | ORAL | Status: DC
  Administered 2013-03-22 – 2013-03-26 (×5): 500 mg via ORAL
  Filled 2013-03-22 (×5): qty 1

## 2013-03-22 MED ORDER — LEVOTHYROXINE SODIUM 75 MCG PO TABS
75.0000 ug | ORAL_TABLET | Freq: Every day | ORAL | Status: DC
  Administered 2013-03-23 – 2013-03-26 (×4): 75 ug via ORAL
  Filled 2013-03-22 (×5): qty 1

## 2013-03-22 MED ORDER — AMLODIPINE BESYLATE 5 MG PO TABS
5.0000 mg | ORAL_TABLET | Freq: Every day | ORAL | Status: DC
  Administered 2013-03-22 – 2013-03-23 (×2): 5 mg via ORAL
  Filled 2013-03-22 (×3): qty 1

## 2013-03-22 MED ORDER — LABETALOL HCL 5 MG/ML IV SOLN
10.0000 mg | INTRAVENOUS | Status: DC | PRN
  Filled 2013-03-22 (×2): qty 4

## 2013-03-22 MED ORDER — ENOXAPARIN SODIUM 30 MG/0.3ML ~~LOC~~ SOLN
30.0000 mg | SUBCUTANEOUS | Status: DC
  Administered 2013-03-22 – 2013-03-25 (×3): 30 mg via SUBCUTANEOUS
  Filled 2013-03-22 (×5): qty 0.3

## 2013-03-22 MED ORDER — METOPROLOL TARTRATE 50 MG PO TABS
75.0000 mg | ORAL_TABLET | Freq: Two times a day (BID) | ORAL | Status: DC
  Administered 2013-03-22 – 2013-03-23 (×3): 75 mg via ORAL
  Filled 2013-03-22 (×5): qty 1

## 2013-03-22 MED ORDER — ACETAMINOPHEN 325 MG PO TABS: 650.0000 mg | ORAL_TABLET | ORAL | Status: DC | PRN

## 2013-03-22 MED ORDER — CALCIUM CARBONATE ANTACID 500 MG PO CHEW
1.0000 | CHEWABLE_TABLET | Freq: Three times a day (TID) | ORAL | Status: DC
  Administered 2013-03-23 – 2013-03-26 (×8): 200 mg via ORAL
  Filled 2013-03-22 (×13): qty 1

## 2013-03-22 MED ORDER — RENA-VITE PO TABS
1.0000 | ORAL_TABLET | Freq: Every day | ORAL | Status: DC
  Administered 2013-03-22 – 2013-03-23 (×2): 1 via ORAL
  Administered 2013-03-24 – 2013-03-25 (×2): via ORAL
  Filled 2013-03-22 (×5): qty 1

## 2013-03-22 MED ORDER — INSULIN ASPART 100 UNIT/ML ~~LOC~~ SOLN
0.0000 [IU] | Freq: Three times a day (TID) | SUBCUTANEOUS | Status: DC
  Administered 2013-03-23 – 2013-03-24 (×2): 2 [IU] via SUBCUTANEOUS
  Administered 2013-03-24: 1 [IU] via SUBCUTANEOUS
  Administered 2013-03-25: 3 [IU] via SUBCUTANEOUS
  Administered 2013-03-26: 1 [IU] via SUBCUTANEOUS

## 2013-03-22 MED ORDER — FOLIC ACID 0.5 MG HALF TAB
0.5000 mg | ORAL_TABLET | Freq: Every day | ORAL | Status: DC
  Administered 2013-03-22 – 2013-03-26 (×5): 0.5 mg via ORAL
  Filled 2013-03-22 (×5): qty 1

## 2013-03-22 MED ORDER — ASPIRIN EC 81 MG PO TBEC
81.0000 mg | DELAYED_RELEASE_TABLET | Freq: Every day | ORAL | Status: DC
  Administered 2013-03-22 – 2013-03-25 (×4): 81 mg via ORAL
  Filled 2013-03-22 (×5): qty 1

## 2013-03-22 NOTE — H&P (Signed)
[facility] History and Physical  SHANNELLE [name] TSV:[id number] DOB: 02-08-1920 DOA: 03/22/2013  Referring physician: Patient was transferred to from [facility]. PCP: Tillman Abide, MD  Specialists: Nephrology for dialysis.  Chief Complaint: Altered mental status and weakness.  HPI: [name] is a 80 y.o. female history of ESRD on hemodialysis Monday Wednesday and Friday, history of aortic valve replacement porcine, CAD status post CABG, diabetes mellitus on diet, hypothyroidism was brought to the ER at [facility] after patient was found to be confused during dialysis today. As discussed with patient's son and per the chart from the [facility] hospital patient CT head which did not show anything acute and UA showed features consistent UTI. Patient was given one dose of IV ceftriaxone and patient also was found to be hypertensive with blood pressure systolic more than 200 and was given Cardizem IV. Since patient's family wanted to transfer patient to [facility] patient was transferred. Patient did not have any chest pain shortness of breath nausea vomiting abdominal pain diarrhea headache or any loss of consciousness. Patient had similar symptom but 2 days ago and was brought to the  ER and UA at that time showed features consistent with UTI and patient was discharged home on Cipro. At that time also patient had CT head which did not show anything acute. On my exam patient does not have any focal deficits and follows commands but looks confused and weak. Patient's blood pressure still high. Patient was admitted in December last for MRSA bacteremia and C. difficile colitis. Patient at that time was eventually discharged to rehabilitation and patient had gone back to her house since this January. As per patient's son patient lives alone and usually is independent. Labs done at [facility] show sodium of 136 potassium of 3.6 bicarbonate of  20 8 PM 1.7 glucose 86 WBC 8.3 hemoglobin 12.4 troponin 0.02 UA shows WBC 107 per high-power field with leukocyte esterase positive. Chest x-ray was showing congestion.  Review of Systems: As presented in the history of presenting illness, rest negative.  Past Medical History  Diagnosis Date  . Anemia     NOS  . Personal history of colonic polyps   . GERD (gastroesophageal reflux disease)   . Hyperlipidemia     takes Simvastatin nightly  . Osteoarthritis   . Osteopenia   . Urinary incontinence   . Pulmonary hypertension   . Anxiety   . Renal insufficiency   . Coronary artery disease   . Aortic stenosis   . Hyperlipidemia   . Hypertension     takes Metoprolol daily  . Peripheral vascular disease   . CHF (congestive heart failure)   . Migraine     hx of  . Constipation   . Diarrhea   . Oligouria   . History of blood transfusion   . Diabetes mellitus     type II;was on Glipizide but has been off x 58mon  . Hypothyroidism     takes Synthroid daily  . Cancer     Mole on top of head to be removed in July 2013   Past Surgical History  Procedure Laterality Date  . Abdominal hysterectomy    . Tonsillectomy    . Cardiac catheterization  2000    cad  . Replacement total knee bilateral  05/1998  . Cataract extraction    . Coronary artery bypass graft      od  . Adenosine myoview  2007  benign, EF 69%  . Carotid endarterectomy  2011    Right  . Coronary artery bypass graft  2009  . Aortic valve replacement  2009  . Amputation-left great toe  7/12    Dr Marlou Sa  . Traumatic amputation of right dip joint of index finger    . Insertion of dialysis catheter  12/18/2010    Procedure: INSERTION OF DIALYSIS CATHETER;  Surgeon: Mal Misty, MD;  Location: [place];  Service: Vascular;  Laterality: Right;  . Av fistula placement  12/22/2010    Procedure: ARTERIOVENOUS (AV) FISTULA CREATION;  Surgeon: Hinda Lenis, MD;  Location: [place];  Service: Vascular;  Laterality: Left;   Creation of Left Brachial-Cephalic Fistula  . Appendectomy    . Eye surgery      BIlateral  . Amputation  06/18/2011    Procedure: AMPUTATION DIGIT;  Surgeon: Newt Minion, MD;  Location: [place];  Service: Orthopedics;  Laterality: Left;  Left 2nd Toe Amputation at [facility]   Social History:  reports that she quit smoking about 70 years ago. Her smoking use included Cigarettes. She smoked 0.00 packs per day for 15 years. She has never used smokeless tobacco. She reports that she drinks alcohol. She reports that she does not use illicit drugs. Where does patient live home. Can patient participate in ADLs? Yes.  Allergies  Allergen Reactions  . Nitrofurantoin Itching  . Captopril Other (See Comments)    REACTION: unspecified  . Enalapril Maleate Cough  . Ramipril Other (See Comments)    REACTION: unspecified  . Sulfa Antibiotics Other (See Comments)    Reaction unknown  . Verapamil Other (See Comments)    REACTION: unspecified    Family History: History reviewed. No pertinent family history.    Prior to Admission medications   Medication Sig Start Date End Date Taking? Authorizing Provider  aspirin EC 81 MG tablet Take 81 mg by mouth at bedtime.    Historical Provider, MD  b complex-vitamin c-folic acid (NEPHRO-VITE) 0.8 MG TABS Take 0.8 mg by mouth every Monday, Wednesday, and Friday.     Historical Provider, MD  calcium carbonate (TUMS - DOSED IN MG ELEMENTAL CALCIUM) 500 MG chewable tablet Chew 1 tablet (200 mg of elemental calcium total) by mouth 3 (three) times daily with meals. 12/06/12   Geradine Girt, DO  ciprofloxacin (CIPRO) 500 MG tablet Take 1 tablet (500 mg total) by mouth 2 (two) times daily. One po bid x 7 days 03/21/13   Veryl Speak, MD  folic acid (FOLVITE) 0.5 MG tablet Take 0.5 tablets (0.5 mg total) by mouth daily. 12/06/12   Geradine Girt, DO  levothyroxine (SYNTHROID, LEVOTHROID) 75 MCG tablet Take 75 mcg by mouth daily before breakfast.    Historical Provider,  MD  metoprolol (LOPRESSOR) 50 MG tablet Take 75 mg by mouth 2 (two) times daily.    Historical Provider, MD  pravastatin (PRAVACHOL) 10 MG tablet Take 10 mg by mouth at bedtime.    Historical Provider, MD  vitamin C (VITAMIN C) 500 MG tablet Take 1 tablet (500 mg total) by mouth daily. 12/06/12   Geradine Girt, DO    Physical Exam: Filed Vitals:   03/22/13 1927  BP: 228/74  Pulse: 90  Temp: 98.3 F (36.8 C)  TempSrc: Oral  Resp: 18  Weight: 43.9 kg (96 lb 12.5 oz)  SpO2: 96%     General:  Well-developed and moderately nourished.  Eyes: Anicteric no pallor.  ENT:  No discharge from the ears eyes nose mouth.  Neck: No mass felt.  Cardiovascular: S1-S2 heard.  Respiratory: No rhonchi or crepitations.  Abdomen: Soft nontender bowel sounds present.  Skin: No rash.  Musculoskeletal: No edema.  Psychiatric: Patient is mildly lethargic.  Neurologic: Patient is mildly lethargic but moves all extremities.  Labs on Admission:  Basic Metabolic Panel:  Recent Labs Lab 03/20/13 1930  NA 135*  K 5.1  CL 90*  CO2 26  GLUCOSE 111*  BUN 34*  CREATININE 2.29*  CALCIUM 10.0   Liver Function Tests: No results found for this basename: AST, ALT, ALKPHOS, BILITOT, PROT, ALBUMIN,  in the last 168 hours No results found for this basename: LIPASE, AMYLASE,  in the last 168 hours No results found for this basename: AMMONIA,  in the last 168 hours CBC:  Recent Labs Lab 03/20/13 1930  WBC 8.9  HGB 13.0  HCT 38.7  MCV 95.6  PLT 276   Cardiac Enzymes: No results found for this basename: CKTOTAL, CKMB, CKMBINDEX, TROPONINI,  in the last 168 hours  BNP (last 3 results)  Recent Labs  03/20/13 1850  PROBNP 21236.0*   CBG:  Recent Labs Lab 03/20/13 2042  GLUCAP 115*    Radiological Exams on Admission: Ct Head Wo Contrast  03/21/2013   CLINICAL DATA:  Weak and lethargic.  EXAM: CT HEAD WITHOUT CONTRAST  TECHNIQUE: Contiguous axial images were obtained from the  base of the skull through the vertex without intravenous contrast.  COMPARISON:  CT 01/29/2007.  FINDINGS: No mass. No hydrocephalus. No hemorrhage. White matter changes noted consistent with chronic ischemic. Old basal ganglia lacunar infarcts. Diffuse cerebral atrophy. Cerebral vascular calcification. Orbits intact. Visualized paranasal sinuses and mastoids clear. No acute bony abnormality.  IMPRESSION: Chronic ischemic change and atrophy.  No acute abnormality.   Electronically Signed   By: Marcello Moores  Register   On: 03/21/2013 01:21    EKG: Independently reviewed. EKG done at [facility] shows sinus tachycardia with poor R-wave progression.  Assessment/Plan Principal Problem:   Acute encephalopathy Active Problems:   HYPOTHYROIDISM   Type II or unspecified type diabetes mellitus with renal manifestations, not stated as uncontrolled(250.40)   S/P aortic valve replacement   End stage renal disease   Accelerated hypertension   UTI (lower urinary tract infection)   1. Acute encephalopathy - probably secondary to UTI and uncontrolled hypertension. Patient has been placed on ceftriaxone. I have placed patient also on labetalol 10 mg IV every 2 hourly when necessary for systolic blood pressure more than 469 and diastolic more than 629. Patient's son states that patient used to be on Norvasc which was discontinued last month by patient's primary care. At this time I have added Norvasc 5 mg by mouth daily. Continue metoprolol. In addition I also ordered a urine cultures blood cultures TSH and ammonia levels. If patient's mental status does not improve consider MRI brain. Patient has been placed on swallow evaluation on neurochecks for now. 2. Accelerated hypertension - see #1. 3. UTI - see #1. 4. ESRD on hemodialysis - Monday Wednesday and Friday. Consult nephrology for dialysis. Patient does not look to be in fluid overload. 5. CAD status post CABG and aortic valve replacement porcine -  denies any chest pain. 6. Hyperlipidemia - was recently started on statins. 7. Diabetes mellitus type 2 on diet - follow CBGs. 8. Admitted recently in December 3 months ago for MRSA bacteremia and C. difficile.   I have reviewed the chart  from [facility] and patient's old charts in the system.  Code Status: Full code.  Family Communication: Patient's son.  Disposition Plan: Admit to inpatient.    Deashia Soule N. [facility] Pager [phone].  If 7PM-7AM, please contact night-coverage [email or web address] Password [facility] 03/22/2013, 9:01 PM

## 2013-03-22 NOTE — Telephone Encounter (Signed)
PENDING ACCEPTANCE TRANFER NOTE:  Call received from:    Dr. Jimmye Norman in [place] [facility] ED  REASON FOR REQUESTING TRANSFER:    UTI/ESRD, family requested transfer to [facility]  HPI:  80 year-old female history of MRSA bacteremia/ESRD/C. difficile presented to [facility] with confusion and urine consistent with UTI. She was in [place] [facility] 2 days ago with the same symptoms and son requested discharge to home on oral medications.   PLAN:  According to telephone report, this patient was accepted for transfer to [facility],   Under [facility] team:  [id number],  I have requested an order be written to call Flow Manager at [phone] upon patient arrival to the floor for final physician assignment who will do the admission and give admitting orders.  SIGNED: Birdie Hopes, MD [facility]  03/22/2013, 4:41 PM

## 2013-03-23 ENCOUNTER — Encounter (HOSPITAL_COMMUNITY): Payer: Self-pay | Admitting: General Practice

## 2013-03-23 DIAGNOSIS — R7989 Other specified abnormal findings of blood chemistry: Secondary | ICD-10-CM

## 2013-03-23 DIAGNOSIS — R778 Other specified abnormalities of plasma proteins: Secondary | ICD-10-CM

## 2013-03-23 DIAGNOSIS — R799 Abnormal finding of blood chemistry, unspecified: Secondary | ICD-10-CM

## 2013-03-23 LAB — TSH: TSH: 0.998 u[IU]/mL (ref 0.350–4.500)

## 2013-03-23 LAB — GLUCOSE, CAPILLARY
GLUCOSE-CAPILLARY: 122 mg/dL — AB (ref 70–99)
GLUCOSE-CAPILLARY: 119 mg/dL — AB (ref 70–99)
GLUCOSE-CAPILLARY: 191 mg/dL — AB (ref 70–99)
Glucose-Capillary: 115 mg/dL — ABNORMAL HIGH (ref 70–99)
Glucose-Capillary: 144 mg/dL — ABNORMAL HIGH (ref 70–99)

## 2013-03-23 LAB — COMPREHENSIVE METABOLIC PANEL
ALT: 18 U/L (ref 0–35)
AST: 25 U/L (ref 0–37)
Albumin: 3.2 g/dL — ABNORMAL LOW (ref 3.5–5.2)
Alkaline Phosphatase: 167 U/L — ABNORMAL HIGH (ref 39–117)
BUN: 39 mg/dL — ABNORMAL HIGH (ref 6–23)
CALCIUM: 9.5 mg/dL (ref 8.4–10.5)
CO2: 27 meq/L (ref 19–32)
CREATININE: 3.07 mg/dL — AB (ref 0.50–1.10)
Chloride: 93 mEq/L — ABNORMAL LOW (ref 96–112)
GFR calc Af Amer: 14 mL/min — ABNORMAL LOW (ref >90)
GFR, EST NON AFRICAN AMERICAN: 12 mL/min — AB (ref >90)
Glucose, Bld: 106 mg/dL — ABNORMAL HIGH (ref 70–99)
Potassium: 5.1 mEq/L (ref 3.7–5.3)
Sodium: 138 mEq/L (ref 137–147)
Total Bilirubin: 0.7 mg/dL (ref 0.3–1.2)
Total Protein: 8.1 g/dL (ref 6.0–8.3)

## 2013-03-23 LAB — CBC WITH DIFFERENTIAL/PLATELET
BASOS PCT: 1 % (ref 0–1)
Basophils Absolute: 0.1 10*3/uL (ref 0.0–0.1)
Eosinophils Absolute: 0.4 10*3/uL (ref 0.0–0.7)
Eosinophils Relative: 6 % — ABNORMAL HIGH (ref 0–5)
HCT: 36.8 % (ref 36.0–46.0)
HEMOGLOBIN: 11.8 g/dL — AB (ref 12.0–15.0)
Lymphocytes Relative: 14 % (ref 12–46)
Lymphs Abs: 0.9 10*3/uL (ref 0.7–4.0)
MCH: 31.6 pg (ref 26.0–34.0)
MCHC: 32.1 g/dL (ref 30.0–36.0)
MCV: 98.4 fL (ref 78.0–100.0)
MONOS PCT: 23 % — AB (ref 3–12)
Monocytes Absolute: 1.4 10*3/uL — ABNORMAL HIGH (ref 0.1–1.0)
NEUTROS ABS: 3.6 10*3/uL (ref 1.7–7.7)
Neutrophils Relative %: 56 % (ref 43–77)
PLATELETS: 235 10*3/uL (ref 150–400)
RBC: 3.74 MIL/uL — AB (ref 3.87–5.11)
RDW: 17.2 % — ABNORMAL HIGH (ref 11.5–15.5)
WBC: 6.3 10*3/uL (ref 4.0–10.5)

## 2013-03-23 LAB — MAGNESIUM: Magnesium: 2.1 mg/dL (ref 1.5–2.5)

## 2013-03-23 LAB — TROPONIN I
TROPONIN I: 0.41 ng/mL — AB (ref <0.30)
Troponin I: <0.3 ng/mL (ref <0.30)

## 2013-03-23 LAB — PHOSPHORUS: Phosphorus: 5.4 mg/dL — ABNORMAL HIGH (ref 2.3–4.6)

## 2013-03-23 NOTE — Progress Notes (Signed)
Troponin level equals 0.41, notified MD Vega. No orders received now, passed information to morning RN.

## 2013-03-23 NOTE — Progress Notes (Signed)
[facility] PROGRESS NOTE  [name]:[id number] DOB: January 13, 1920 DOA: 03/22/2013 PCP: Viviana Simpler, MD  Assessment/Plan: 1. Acute encephalopathy - Improving, A & Ox 3 - Most likely secondary to UTI and uncontrolled hypertension - Ceftriaxone day 2 - Norvasc 5 mg QD and Metoprolol - Labetalol 10mg  PRN - Urine and blood cultures pending - TSH 0.998 on 3/18 - Swallow evaluation diet recommendation: regular thin liquid  2. Accelerated hypertension - Improving - BP medication as above - Continue to monitor  3. UTI - Ceftriaxone day 2 - Urine cultures pending  4. ESRD on hemodialysis - Schedule: Monday, Wednesday, and Friday - Consulted nephrology for dialysis - Cr 3.07 on 3/19  5. Elevated troponin - Troponin elevated x2, neg x 1 - Pt denies any chest pain - EKG: SR, no ST elevations/depressions - Cardiology consulted: elevations likely related to renal insufficiency, no further ischemia evaluation recommended. Pt to follow up with Dr. Rockey Situ following discharge  6. CAD s/p CABG and aortic valve replacement procine - Denies any chest pain - Continue prophylaxis: lovenox - Continue ASA and statin  7. Hyperlipidemia - Continue statin  8. Diabetes mellitus type 2 - Follow CBGs - SSI  9. Hypothyroidism - Continue synthroid  10. Recent admission for MSRA bacteremia and C. Difficile in Dec 2014 - Monitor, no diarrhea at this time  DVT prevention: Lovenox  Code Status: FULL Family Communication: Spoke with patient. No family at bedside Disposition Plan: Pending further improvement   Consultants:  Cardiology  Procedures:  CT head: chronic ischemic change and atrophy. No acute abnormality  Antibiotics:  Rocephin (started: ) day 2  HPI/Subjective: Pt has no new complaints. No acute issues reported overnight.  Objective: Filed Vitals:   03/23/13 0500  BP: 155/52  Pulse: 79  Temp: 98.6 F (37 C)  Resp: 18    Intake/Output Summary (Last  24 hours) at 03/23/13 0935 Last data filed at 03/22/13 2300  Gross per 24 hour  Intake     50 ml  Output      0 ml  Net     50 ml   Filed Weights   03/22/13 1927  Weight: 43.9 kg (96 lb 12.5 oz)    Exam:   General:  Pt in NAD, Alert and awake, oriented x 3  Cardiovascular: RRR, no MRG  Respiratory: CTA BL, no wheezes   Abdomen: soft, NT, ND, + bowel sounds  Musculoskeletal: no cyanosis or clubbing   Data Reviewed: Basic Metabolic Panel:  Recent Labs Lab 03/20/13 1930 03/22/13 2235 03/23/13 0540  NA 135*  --  138  K 5.1  --  5.1  CL 90*  --  93*  CO2 26  --  27  GLUCOSE 111*  --  106*  BUN 34*  --  39*  CREATININE 2.29* 2.38* 3.07*  CALCIUM 10.0  --  9.5   Liver Function Tests:  Recent Labs Lab 03/23/13 0540  AST 25  ALT 18  ALKPHOS 167*  BILITOT 0.7  PROT 8.1  ALBUMIN 3.2*   No results found for this basename: LIPASE, AMYLASE,  in the last 168 hours  Recent Labs Lab 03/22/13 2234  AMMONIA 33   CBC:  Recent Labs Lab 03/20/13 1930 03/22/13 2235 03/23/13 0540  WBC 8.9 8.1 6.3  NEUTROABS  --   --  3.6  HGB 13.0 12.0 11.8*  HCT 38.7 37.0 36.8  MCV 95.6 96.9 98.4  PLT 276 233 235   Cardiac Enzymes:  Recent Labs  Lab 03/22/13 2234 03/22/13 2235 03/23/13 0540  CKTOTAL  --  47  --   TROPONINI 0.37*  --  0.41*   BNP (last 3 results)  Recent Labs  03/20/13 1850  PROBNP 21236.0*   CBG:  Recent Labs Lab 03/20/13 2042 03/23/13 0511 03/23/13 0759  GLUCAP 115* 115* 122*    Recent Results (from the past 240 hour(s))  MRSA PCR SCREENING     Status: None   Collection Time    03/22/13  7:59 PM      Result Value Ref Range Status   MRSA by PCR NEGATIVE  NEGATIVE Final   Comment:            The GeneXpert MRSA Assay (FDA     approved for NASAL specimens     only), is one component of a     comprehensive MRSA colonization     surveillance program. It is not     intended to diagnose MRSA     infection nor to guide or      monitor treatment for     MRSA infections.     Studies: No results found.  Scheduled Meds: . amLODipine  5 mg Oral Daily  . aspirin EC  81 mg Oral QHS  . calcium carbonate  1 tablet Oral TID WC  . cefTRIAXone (ROCEPHIN)  IV  1 g Intravenous Q24H  . enoxaparin (LOVENOX) injection  30 mg Subcutaneous Q24H  . folic acid  0.5 mg Oral Daily  . insulin aspart  0-9 Units Subcutaneous TID WC  . levothyroxine  75 mcg Oral QAC breakfast  . metoprolol  75 mg Oral BID  . multivitamin  1 tablet Oral QHS  . simvastatin  5 mg Oral q1800  . vitamin C  500 mg Oral Daily   Continuous Infusions:   Principal Problem:   Acute encephalopathy Active Problems:   HYPOTHYROIDISM   Type II or unspecified type diabetes mellitus with renal manifestations, not stated as uncontrolled(250.40)   S/P aortic valve replacement   End stage renal disease   Accelerated hypertension   UTI (lower urinary tract infection)    Time spent: >35 min    Velvet Bathe  [facility] Pager [phone]. If 7PM-7AM, please contact night-coverage at [email or web address], password [facility] 03/23/2013, 9:35 AM  LOS: 1 day

## 2013-03-23 NOTE — Progress Notes (Signed)
Received a critical lab value from lab, troponin level equals 0.37. EKG 12-lead done per MD's order. Will continue to monitor patient.

## 2013-03-23 NOTE — Consult Note (Signed)
HPI: 80 year old female with past medical history of end-stage renal disease dialysis dependent, coronary artery disease with prior coronary artery bypassing graft as well as bioprosthetic aortic valve replacement in 2009 for evaluation of elevated troponin. Last echocardiogram in November of 2014 showed normal LV function, grade 2 diastolic dysfunction, bioprosthetic aortic valve with mild stenosis, mild mitral regurgitation, mild biatrial enlargement and severe tricuspid regurgitation. She did have bacteremia during that admission in November. Transesophageal echocardiogram was considered but given age and frail body habitus risk felt to outweigh benefit. Admitted March 18 after developing confusion during dialysis. Head CT at [place] apparently showed nothing acute. UA suggested UTI. She was also noted to be hypertensive. Confusion felt secondary to the above. Troponin checked for unclear reasons. Mildly elevated and cardiology asked to evaluate. Patient denies dyspnea, chest pain, palpitations or syncope.  Medications Prior to Admission  Medication Sig Dispense Refill  . aspirin EC 81 MG tablet Take 81 mg by mouth at bedtime.      Marland Kitchen b complex-vitamin c-folic acid (NEPHRO-VITE) 0.8 MG TABS Take 0.8 mg by mouth every Monday, Wednesday, and Friday.       . calcium carbonate (TUMS - DOSED IN MG ELEMENTAL CALCIUM) 500 MG chewable tablet Chew 1 tablet (200 mg of elemental calcium total) by mouth 3 (three) times daily with meals.      . ciprofloxacin (CIPRO) 500 MG tablet Take 1 tablet (500 mg total) by mouth 2 (two) times daily. One po bid x 7 days  14 tablet  0  . folic acid (FOLVITE) 0.5 MG tablet Take 0.5 tablets (0.5 mg total) by mouth daily.      Marland Kitchen levothyroxine (SYNTHROID, LEVOTHROID) 75 MCG tablet Take 75 mcg by mouth daily before breakfast.      . metoprolol (LOPRESSOR) 50 MG tablet Take 75 mg by mouth 2 (two) times daily.      . pravastatin (PRAVACHOL) 10 MG tablet Take 10 mg by mouth at  bedtime.      . vitamin C (VITAMIN C) 500 MG tablet Take 1 tablet (500 mg total) by mouth daily.        Allergies  Allergen Reactions  . Nitrofurantoin Itching  . Captopril Other (See Comments)    REACTION: unspecified  . Enalapril Maleate Cough  . Ramipril Other (See Comments)    REACTION: unspecified  . Sulfa Antibiotics Other (See Comments)    Reaction unknown  . Verapamil Other (See Comments)    REACTION: unspecified    Past Medical History  Diagnosis Date  . Anemia     NOS  . Personal history of colonic polyps   . GERD (gastroesophageal reflux disease)   . Hyperlipidemia     takes Simvastatin nightly  . Osteoarthritis   . Osteopenia   . Urinary incontinence   . Pulmonary hypertension   . Anxiety   . Renal insufficiency   . Coronary artery disease   . Aortic stenosis   . Hyperlipidemia   . Hypertension     takes Metoprolol daily  . Peripheral vascular disease   . CHF (congestive heart failure)   . Migraine     hx of  . Constipation   . Diarrhea   . Oligouria   . History of blood transfusion   . Diabetes mellitus     type II;was on Glipizide but has been off x 21mo  . Hypothyroidism     takes Synthroid daily  . Cancer     Mole on  top of head to be removed in July 2013    Past Surgical History  Procedure Laterality Date  . Abdominal hysterectomy    . Tonsillectomy    . Cardiac catheterization  2000    cad  . Replacement total knee bilateral  05/1998  . Cataract extraction    . Coronary artery bypass graft      od  . Adenosine myoview  2007    benign, EF 69%  . Carotid endarterectomy  2011    Right  . Coronary artery bypass graft  2009  . Aortic valve replacement  2009  . Amputation-left great toe  7/12    Dr Marlou Sa  . Traumatic amputation of right dip joint of index finger    . Insertion of dialysis catheter  12/18/2010    Procedure: INSERTION OF DIALYSIS CATHETER;  Surgeon: Mal Misty, MD;  Location: [place];  Service: Vascular;  Laterality:  Right;  . Av fistula placement  12/22/2010    Procedure: ARTERIOVENOUS (AV) FISTULA CREATION;  Surgeon: Hinda Lenis, MD;  Location: [place];  Service: Vascular;  Laterality: Left;  Creation of Left Brachial-Cephalic Fistula  . Appendectomy    . Eye surgery      BIlateral  . Amputation  06/18/2011    Procedure: AMPUTATION DIGIT;  Surgeon: Newt Minion, MD;  Location: [place];  Service: Orthopedics;  Laterality: Left;  Left 2nd Toe Amputation at MTP Joint    History   Social History  . Marital Status: Widowed    Spouse Name: [name]    Number of Children: 1  . Years of Education: [name]   Occupational History  . 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 Smoking status: Former Smoker -- 15 years    Types: Cigarettes    Quit date: 01/06/1943  . Smokeless tobacco: Never Used  . Alcohol Use: Yes     Comment: Wine occasionally  . Drug Use: No  . Sexual Activity: No   Other Topics Concern  . Not on file   Social History Narrative   Son Evlyn Clines to make decisions for her   Not sure about DNR---would defer to him (though inclined to not want resuscitation attempts)    History reviewed. No pertinent family history.  ROS:  Recent confusion but no fevers or chills, productive cough, hemoptysis, dysphasia, odynophagia, melena, hematochezia, dysuria, hematuria, rash, seizure activity, orthopnea, PND, pedal edema, claudication. Remaining systems are negative.  Physical Exam:   Blood pressure 167/51, pulse 80, temperature 98.2 F (36.8 C), temperature source Oral, resp. rate 18, weight 96 lb 12.5 oz (43.9 kg), SpO2 96.00%.  General:  Well developed/frail in NAD Skin warm/dry Patient not depressed No peripheral clubbing Back-normal HEENT-previous incision forehead for skin cancer Neck supple/normal carotid upstroke bilaterally; right carotid bruit; no JVD; no thyromegaly chest - CTA/ normal expansion; previous sternotomy CV - RRR/normal S1 and S2; no  rubs or gallops;  PMI nondisplaced, 1/6 systolic murmur Abdomen -NT/ND, no HSM, no mass, + bowel sounds, no bruit 2+ femoral pulses, no bruits Ext-no edema, no chords, 2+ DP; status post amputation first digit right upper extremity. Status post amputation first 2 digits left lower extremity Neuro-grossly nonfocal  ECG sinus rhythm, septal infarct, left ventricular hypertrophy, left axis deviation, prolonged QT interval.  Results for orders placed during the hospital encounter of 03/22/13 (from the past 48 hour(s))  MRSA PCR SCREENING     Status: None   Collection Time  03/22/13  7:59 PM      Result Value Ref Range   MRSA by PCR NEGATIVE  NEGATIVE   Comment:            The GeneXpert MRSA Assay (FDA     approved for NASAL specimens     only), is one component of a     comprehensive MRSA colonization     surveillance program. It is not     intended to diagnose MRSA     infection nor to guide or     monitor treatment for     MRSA infections.  TROPONIN I     Status: Abnormal   Collection Time    03/22/13 10:34 PM      Result Value Ref Range   Troponin I 0.37 (*) <0.30 ng/mL   Comment:            Due to the release kinetics of cTnI,     a negative result within the first hours     of the onset of symptoms does not rule out     myocardial infarction with certainty.     If myocardial infarction is still suspected,     repeat the test at appropriate intervals.     CRITICAL RESULT CALLED TO, READ BACK BY AND VERIFIED WITH:     Donna Bernard RN [id number] [place], S  AMMONIA     Status: None   Collection Time    03/22/13 10:34 PM      Result Value Ref Range   Ammonia 33  11 - 60 umol/L  CK     Status: None   Collection Time    03/22/13 10:35 PM      Result Value Ref Range   Total CK 47  7 - 177 U/L  CBC     Status: Abnormal   Collection Time    03/22/13 10:35 PM      Result Value Ref Range   WBC 8.1  4.0 - 10.5 K/uL   RBC 3.82 (*) 3.87 - 5.11 MIL/uL   Hemoglobin  12.0  12.0 - 15.0 g/dL   HCT 37.0  36.0 - 46.0 %   MCV 96.9  78.0 - 100.0 fL   MCH 31.4  26.0 - 34.0 pg   MCHC 32.4  30.0 - 36.0 g/dL   RDW 17.2 (*) 11.5 - 15.5 %   Platelets 233  150 - 400 K/uL  CREATININE, SERUM     Status: Abnormal   Collection Time    03/22/13 10:35 PM      Result Value Ref Range   Creatinine, Ser 2.38 (*) 0.50 - 1.10 mg/dL   GFR calc non Af Amer 17 (*) >90 mL/min   GFR calc Af Amer 19 (*) >90 mL/min   Comment: (NOTE)     The eGFR has been calculated using the CKD EPI equation.     This calculation has not been validated in all clinical situations.     eGFR's persistently <90 mL/min signify possible Chronic Kidney     Disease.  GLUCOSE, CAPILLARY     Status: Abnormal   Collection Time    03/23/13  5:11 AM      Result Value Ref Range   Glucose-Capillary 115 (*) 70 - 99 mg/dL  COMPREHENSIVE METABOLIC PANEL     Status: Abnormal   Collection Time    03/23/13  5:40 AM      Result Value Ref Range   Sodium  138  137 - 147 mEq/L   Potassium 5.1  3.7 - 5.3 mEq/L   Chloride 93 (*) 96 - 112 mEq/L   CO2 27  19 - 32 mEq/L   Glucose, Bld 106 (*) 70 - 99 mg/dL   BUN 39 (*) 6 - 23 mg/dL   Creatinine, Ser 3.07 (*) 0.50 - 1.10 mg/dL   Calcium 9.5  8.4 - 10.5 mg/dL   Total Protein 8.1  6.0 - 8.3 g/dL   Albumin 3.2 (*) 3.5 - 5.2 g/dL   AST 25  0 - 37 U/L   ALT 18  0 - 35 U/L   Alkaline Phosphatase 167 (*) 39 - 117 U/L   Total Bilirubin 0.7  0.3 - 1.2 mg/dL   GFR calc non Af Amer 12 (*) >90 mL/min   GFR calc Af Amer 14 (*) >90 mL/min   Comment: (NOTE)     The eGFR has been calculated using the CKD EPI equation.     This calculation has not been validated in all clinical situations.     eGFR's persistently <90 mL/min signify possible Chronic Kidney     Disease.  CBC WITH DIFFERENTIAL     Status: Abnormal   Collection Time    03/23/13  5:40 AM      Result Value Ref Range   WBC 6.3  4.0 - 10.5 K/uL   RBC 3.74 (*) 3.87 - 5.11 MIL/uL   Hemoglobin 11.8 (*) 12.0 - 15.0  g/dL   HCT 36.8  36.0 - 46.0 %   MCV 98.4  78.0 - 100.0 fL   MCH 31.6  26.0 - 34.0 pg   MCHC 32.1  30.0 - 36.0 g/dL   RDW 17.2 (*) 11.5 - 15.5 %   Platelets 235  150 - 400 K/uL   Neutrophils Relative % 56  43 - 77 %   Neutro Abs 3.6  1.7 - 7.7 K/uL   Lymphocytes Relative 14  12 - 46 %   Lymphs Abs 0.9  0.7 - 4.0 K/uL   Monocytes Relative 23 (*) 3 - 12 %   Monocytes Absolute 1.4 (*) 0.1 - 1.0 K/uL   Eosinophils Relative 6 (*) 0 - 5 %   Eosinophils Absolute 0.4  0.0 - 0.7 K/uL   Basophils Relative 1  0 - 1 %   Basophils Absolute 0.1  0.0 - 0.1 K/uL  TROPONIN I     Status: Abnormal   Collection Time    03/23/13  5:40 AM      Result Value Ref Range   Troponin I 0.41 (*) <0.30 ng/mL   Comment:            Due to the release kinetics of cTnI,     a negative result within the first hours     of the onset of symptoms does not rule out     myocardial infarction with certainty.     If myocardial infarction is still suspected,     repeat the test at appropriate intervals.     CRITICAL VALUE NOTED.  VALUE IS CONSISTENT WITH PREVIOUSLY REPORTED AND CALLED VALUE.  MAGNESIUM     Status: None   Collection Time    03/23/13  5:40 AM      Result Value Ref Range   Magnesium 2.1  1.5 - 2.5 mg/dL  PHOSPHORUS     Status: Abnormal   Collection Time    03/23/13  5:40 AM      Result  Value Ref Range   Phosphorus 5.4 (*) 2.3 - 4.6 mg/dL  GLUCOSE, CAPILLARY     Status: Abnormal   Collection Time    03/23/13  7:59 AM      Result Value Ref Range   Glucose-Capillary 122 (*) 70 - 99 mg/dL  TROPONIN I     Status: None   Collection Time    03/23/13 10:50 AM      Result Value Ref Range   Troponin I <0.30  <0.30 ng/mL   Comment:            Due to the release kinetics of cTnI,     a negative result within the first hours     of the onset of symptoms does not rule out     myocardial infarction with certainty.     If myocardial infarction is still suspected,     repeat the test at appropriate  intervals.  GLUCOSE, CAPILLARY     Status: Abnormal   Collection Time    03/23/13 11:44 AM      Result Value Ref Range   Glucose-Capillary 119 (*) 70 - 99 mg/dL    No results found.  Assessment/Plan 1 elevated troponin-patient is not having chest pain and electrocardiogram shows no acute ST changes. Elevation may be related to renal insufficiency. Given age, frailty and lack of symptoms I would not pursue further ischemia evaluation. 2 end-stage renal disease-per nephrology. 3 confusion-question related to UTI; further evaluation per primary care. 4 hypertension- improved; continue metoprolol and Norvasc and monitor. 5 history of aortic valve replacement-continue SBE prophylaxis. 6 CAD-continue ASA and statin Please call with questions; patient should fu with Dr Rockey Situ following DC. Kirk Ruths MD 03/23/2013, 12:15 PM

## 2013-03-23 NOTE — Evaluation (Signed)
Clinical/Bedside Swallow Evaluation Patient Details  Name: [name] MRN: [id number] Date of Birth: June 22, 1920  Today's Date: 03/23/2013 Time: 0950-1009 SLP Time Calculation (min): 19 min  Past Medical History:  Past Medical History  Diagnosis Date  . Anemia     NOS  . Personal history of colonic polyps   . GERD (gastroesophageal reflux disease)   . Hyperlipidemia     takes Simvastatin nightly  . Osteoarthritis   . Osteopenia   . Urinary incontinence   . Pulmonary hypertension   . Anxiety   . Renal insufficiency   . Coronary artery disease   . Aortic stenosis   . Hyperlipidemia   . Hypertension     takes Metoprolol daily  . Peripheral vascular disease   . CHF (congestive heart failure)   . Migraine     hx of  . Constipation   . Diarrhea   . Oligouria   . History of blood transfusion   . Diabetes mellitus     type II;was on Glipizide but has been off x 43mon  . Hypothyroidism     takes Synthroid daily  . Cancer     Mole on top of head to be removed in July 2013   Past Surgical History:  Past Surgical History  Procedure Laterality Date  . Abdominal hysterectomy    . Tonsillectomy    . Cardiac catheterization  2000    cad  . Replacement total knee bilateral  05/1998  . Cataract extraction    . Coronary artery bypass graft      od  . Adenosine myoview  2007    benign, EF 69%  . Carotid endarterectomy  2011    Right  . Coronary artery bypass graft  2009  . Aortic valve replacement  2009  . Amputation-left great toe  7/12    Dr Marlou Sa  . Traumatic amputation of right dip joint of index finger    . Insertion of dialysis catheter  12/18/2010    Procedure: INSERTION OF DIALYSIS CATHETER;  Surgeon: Mal Misty, MD;  Location: [place];  Service: Vascular;  Laterality: Right;  . Av fistula placement  12/22/2010    Procedure: ARTERIOVENOUS (AV) FISTULA CREATION;  Surgeon: Hinda Lenis, MD;  Location: [place];  Service: Vascular;  Laterality: Left;  Creation  of Left Brachial-Cephalic Fistula  . Appendectomy    . Eye surgery      BIlateral  . Amputation  06/18/2011    Procedure: AMPUTATION DIGIT;  Surgeon: Newt Minion, MD;  Location: [place];  Service: Orthopedics;  Laterality: Left;  Left 2nd Toe Amputation at MTP Joint   HPI:  [name] is a 80 y.o. female history of ESRD on hemodialysis Monday Wednesday and Friday, history of aortic valve replacement porcine, CAD status post CABG, diabetes mellitus on diet, hypothyroidism was brought to the ER at [facility] after patient was found to be confused during dialysis today. As discussed with patient's son and per the chart from the [facility] hospital patient CT head which did not show anything acute and UA showed features consistent UTI. Patient was given one dose of IV ceftriaxone and patient also was found to be hypertensive with blood pressure systolic more than [id number] and was given Cardizem IV. Since patient's family wanted to transfer patient to [facility] patient was transferred. Patient did not have any chest pain shortness of breath nausea vomiting abdominal pain diarrhea headache or any loss of consciousness. Patient had  similar symptom but 2 days ago and was brought to the [place] ER and UA at that time showed features consistent with UTI and patient was discharged home on Cipro. At that time also patient had CT head which did not show anything acute. On my exam patient does not have any focal deficits and follows commands but looks confused and weak. Patient's blood pressure still high. Patient was admitted in December last for MRSA bacteremia and C. difficile colitis. Patient at that time was eventually discharged to rehabilitation and patient had gone back to her house since this January. As per patient's son patient lives alone and usually is independent. Pt was given stroke swallow screen, failed in ED due to history of dysphagia, no history in our charts.    Assessment / Plan  / Recommendation Clinical Impression  Pt demonstrates swallow function WNL. No SLP f/u needed, will start regular diet/thin liquids.     Aspiration Risk  Mild    Diet Recommendation Regular;Thin liquid   Liquid Administration via: Cup;Straw Medication Administration: Whole meds with liquid    Other  Recommendations Oral Care Recommendations: Oral care BID   Follow Up Recommendations       Frequency and Duration        Pertinent Vitals/Pain NA    SLP Swallow Goals     Swallow Study Prior Functional Status       General HPI: [name] is a 80 y.o. female history of ESRD on hemodialysis Monday Wednesday and Friday, history of aortic valve replacement porcine, CAD status post CABG, diabetes mellitus on diet, hypothyroidism was brought to the ER at [facility] after patient was found to be confused during dialysis today. As discussed with patient's son and per the chart from the [facility] hospital patient CT head which did not show anything acute and UA showed features consistent UTI. Patient was given one dose of IV ceftriaxone and patient also was found to be hypertensive with blood pressure systolic more than 683 and was given Cardizem IV. Since patient's family wanted to transfer patient to [facility] patient was transferred. Patient did not have any chest pain shortness of breath nausea vomiting abdominal pain diarrhea headache or any loss of consciousness. Patient had similar symptom but 2 days ago and was brought to the [place] ER and UA at that time showed features consistent with UTI and patient was discharged home on Cipro. At that time also patient had CT head which did not show anything acute. On my exam patient does not have any focal deficits and follows commands but looks confused and weak. Patient's blood pressure still high. Patient was admitted in December last for MRSA bacteremia and C. difficile colitis. Patient at that time was eventually  discharged to rehabilitation and patient had gone back to her house since this January. As per patient's son patient lives alone and usually is independent. Pt was given stroke swallow screen, failed in ED due to history of dysphagia, no history in our charts.  Type of Study: Bedside swallow evaluation Previous Swallow Assessment: none in chart Diet Prior to this Study: NPO Temperature Spikes Noted: No Respiratory Status: Room air History of Recent Intubation: No Behavior/Cognition: Alert;Cooperative;Pleasant mood Oral Cavity - Dentition: Dentures, top;Dentures, bottom Self-Feeding Abilities: Able to feed self Patient Positioning: Upright in bed Baseline Vocal Quality: Clear Volitional Cough: Strong Volitional Swallow: Able to elicit    Oral/Motor/Sensory Function Overall Oral Motor/Sensory Function: Appears within functional limits for tasks assessed  Ice Chips     Thin Liquid Thin Liquid: Within functional limits    Nectar Thick Nectar Thick Liquid: Not tested   Honey Thick Honey Thick Liquid: Not tested   Puree Puree: Not tested   Solid   GO    Solid: Within functional limits       [name], Katherene Ponto 03/23/2013,10:09 AM

## 2013-03-24 LAB — HEPATITIS B SURFACE ANTIBODY,QUALITATIVE: Hep B S Ab: POSITIVE — AB

## 2013-03-24 LAB — URINE CULTURE

## 2013-03-24 LAB — GLUCOSE, CAPILLARY
GLUCOSE-CAPILLARY: 209 mg/dL — AB (ref 70–99)
Glucose-Capillary: 122 mg/dL — ABNORMAL HIGH (ref 70–99)
Glucose-Capillary: 157 mg/dL — ABNORMAL HIGH (ref 70–99)
Glucose-Capillary: 85 mg/dL (ref 70–99)

## 2013-03-24 LAB — HEPATITIS B SURFACE ANTIGEN: HEP B S AG: NEGATIVE

## 2013-03-24 LAB — HEPATITIS B CORE ANTIBODY, TOTAL: HEP B C TOTAL AB: NONREACTIVE

## 2013-03-24 MED ORDER — AMLODIPINE BESYLATE 10 MG PO TABS
10.0000 mg | ORAL_TABLET | Freq: Every day | ORAL | Status: DC
  Administered 2013-03-24 – 2013-03-26 (×3): 10 mg via ORAL
  Filled 2013-03-24 (×3): qty 1

## 2013-03-24 MED ORDER — METOPROLOL TARTRATE 100 MG PO TABS
100.0000 mg | ORAL_TABLET | Freq: Two times a day (BID) | ORAL | Status: DC
  Administered 2013-03-24 – 2013-03-26 (×5): 100 mg via ORAL
  Filled 2013-03-24 (×6): qty 1

## 2013-03-24 MED ORDER — PENTAFLUOROPROP-TETRAFLUOROETH EX AERO: 1.0000 "application " | INHALATION_SPRAY | CUTANEOUS | Status: DC | PRN

## 2013-03-24 MED ORDER — NEPRO/CARBSTEADY PO LIQD
237.0000 mL | ORAL | Status: DC | PRN
Start: 2013-03-24 — End: 2013-03-24
  Filled 2013-03-24: qty 237

## 2013-03-24 MED ORDER — LIDOCAINE HCL (PF) 1 % IJ SOLN: 5.0000 mL | INTRAMUSCULAR | Status: DC | PRN

## 2013-03-24 MED ORDER — ALTEPLASE 2 MG IJ SOLR
2.0000 mg | Freq: Once | INTRAMUSCULAR | Status: DC | PRN
  Filled 2013-03-24: qty 2

## 2013-03-24 MED ORDER — SODIUM CHLORIDE 0.9 % IV SOLN: 100.0000 mL | INTRAVENOUS | Status: DC | PRN

## 2013-03-24 MED ORDER — HEPARIN SODIUM (PORCINE) 1000 UNIT/ML DIALYSIS
100.0000 [IU]/kg | INTRAMUSCULAR | Status: DC | PRN
  Filled 2013-03-24: qty 5

## 2013-03-24 MED ORDER — LIDOCAINE-PRILOCAINE 2.5-2.5 % EX CREA
1.0000 "application " | TOPICAL_CREAM | CUTANEOUS | Status: DC | PRN
  Filled 2013-03-24: qty 5

## 2013-03-24 MED ORDER — HEPARIN SODIUM (PORCINE) 1000 UNIT/ML DIALYSIS: 1000.0000 [IU] | INTRAMUSCULAR | Status: DC | PRN

## 2013-03-24 NOTE — Progress Notes (Signed)
[facility] PROGRESS NOTE  [name] [id number] DOB: Apr 17, 1920 DOA: 03/22/2013 PCP: Viviana Simpler, MD  Assessment/Plan: 1. Acute encephalopathy - Improving, A & Ox 3 - Most likely secondary to UTI and uncontrolled hypertension - Ceftriaxone day 3 - Blood cultures NTD - TSH 0.998 on 3/18 - Swallow evaluation diet recommendation: regular thin liquid  2. Accelerated hypertension - Improving - BP not well controlled today.  Will increase amlodipine and B blocker dose and reassess.  3. UTI - Ceftriaxone day 3  4. ESRD on hemodialysis - Schedule: Monday, Wednesday, and Friday - Consulted nephrology for dialysis - Cr 3.07 on 3/19  5. Elevated troponin - Troponin elevated x2, neg x 1 - Pt denies any chest pain - EKG: SR, no ST elevations/depressions - Cardiology consulted: elevations likely related to renal insufficiency, no further ischemia evaluation recommended. Pt to follow up with Dr. Rockey Situ following discharge  6. CAD s/p CABG and aortic valve replacement procine - Denies any chest pain - Continue prophylaxis: lovenox - Continue ASA and statin  7. Hyperlipidemia - Continue statin  8. Diabetes mellitus type 2 - Follow CBGs - SSI  9. Hypothyroidism - Continue synthroid  10. Recent admission for MSRA bacteremia and C. Difficile in Dec 2014 - Monitor, no diarrhea at this time  DVT prevention: Lovenox  Code Status: FULL Family Communication: Spoke with patient. No family at bedside Disposition Plan: Pending further improvement   Consultants:  Cardiology  Procedures:  CT head: chronic ischemic change and atrophy. No acute abnormality  Antibiotics:  Rocephin (started: ) day 2  HPI/Subjective: Pt has no new complaints. No acute issues reported overnight.  Objective: Filed Vitals:   03/24/13 1615  BP: 135/64  Pulse: 85  Temp: 97.8 F (36.6 C)  Resp: 16    Intake/Output Summary (Last 24 hours) at 03/24/13 1659 Last data filed at  03/24/13 1615  Gross per 24 hour  Intake    480 ml  Output   1000 ml  Net   -520 ml   Filed Weights   03/22/13 1927 03/24/13 1135 03/24/13 1615  Weight: 43.9 kg (96 lb 12.5 oz) 44.4 kg (97 lb 14.2 oz) 43.4 kg (95 lb 10.9 oz)    Exam:   General:  Pt in NAD, Alert and awake, oriented x 3  Cardiovascular: RRR, no MRG  Respiratory: CTA BL, no wheezes   Abdomen: soft, NT, ND, + bowel sounds  Musculoskeletal: no cyanosis or clubbing   Data Reviewed: Basic Metabolic Panel:  Recent Labs Lab 03/20/13 1930 03/22/13 2235 03/23/13 0540  NA 135*  --  138  K 5.1  --  5.1  CL 90*  --  93*  CO2 26  --  27  GLUCOSE 111*  --  106*  BUN 34*  --  39*  CREATININE 2.29* 2.38* 3.07*  CALCIUM 10.0  --  9.5  MG  --   --  2.1  PHOS  --   --  5.4*   Liver Function Tests:  Recent Labs Lab 03/23/13 0540  AST 25  ALT 18  ALKPHOS 167*  BILITOT 0.7  PROT 8.1  ALBUMIN 3.2*   No results found for this basename: LIPASE, AMYLASE,  in the last 168 hours  Recent Labs Lab 03/22/13 2234  AMMONIA 33   CBC:  Recent Labs Lab 03/20/13 1930 03/22/13 2235 03/23/13 0540  WBC 8.9 8.1 6.3  NEUTROABS  --   --  3.6  HGB 13.0 12.0 11.8*  HCT 38.7 37.0  36.8  MCV 95.6 96.9 98.4  PLT 276 233 235   Cardiac Enzymes:  Recent Labs Lab 03/22/13 2234 03/22/13 2235 03/23/13 0540 03/23/13 1050  CKTOTAL  --  47  --   --   TROPONINI 0.37*  --  0.41* <0.30   BNP (last 3 results)  Recent Labs  03/20/13 1850  PROBNP 21236.0*   CBG:  Recent Labs Lab 03/23/13 1144 03/23/13 1642 03/23/13 2119 03/24/13 0733 03/24/13 1138  GLUCAP 119* 191* 144* 122* 157*    Recent Results (from the past 240 hour(s))  MRSA PCR SCREENING     Status: None   Collection Time    03/22/13  7:59 PM      Result Value Ref Range Status   MRSA by PCR NEGATIVE  NEGATIVE Final   Comment:            The GeneXpert MRSA Assay (FDA     approved for NASAL specimens     only), is one component of a      comprehensive MRSA colonization     surveillance program. It is not     intended to diagnose MRSA     infection nor to guide or     monitor treatment for     MRSA infections.  CULTURE, BLOOD (ROUTINE X 2)     Status: None   Collection Time    03/22/13 10:34 PM      Result Value Ref Range Status   Specimen Description BLOOD RIGHT HAND   Final   Special Requests BOTTLES DRAWN AEROBIC ONLY 4CC   Final   Culture  Setup Time     Final   Value: 03/23/2013 03:32     Performed at [facility]   Culture     Final   Value:        BLOOD CULTURE RECEIVED NO GROWTH TO DATE CULTURE WILL BE HELD FOR 5 DAYS BEFORE ISSUING A FINAL NEGATIVE REPORT     Performed at [facility]   Report Status PENDING   Incomplete  CULTURE, BLOOD (ROUTINE X 2)     Status: None   Collection Time    03/22/13 10:40 PM      Result Value Ref Range Status   Specimen Description BLOOD RIGHT FINGER   Final   Special Requests BOTTLES DRAWN AEROBIC ONLY 2CC RT INDEX FINGER   Final   Culture  Setup Time     Final   Value: 03/23/2013 03:32     Performed at [facility]   Culture     Final   Value:        BLOOD CULTURE RECEIVED NO GROWTH TO DATE CULTURE WILL BE HELD FOR 5 DAYS BEFORE ISSUING A FINAL NEGATIVE REPORT     Performed at [facility]   Report Status PENDING   Incomplete     Studies: No results found.  Scheduled Meds: . amLODipine  10 mg Oral Daily  . aspirin EC  81 mg Oral QHS  . calcium carbonate  1 tablet Oral TID WC  . cefTRIAXone (ROCEPHIN)  IV  1 g Intravenous Q24H  . enoxaparin (LOVENOX) injection  30 mg Subcutaneous Q24H  . folic acid  0.5 mg Oral Daily  . insulin aspart  0-9 Units Subcutaneous TID WC  . levothyroxine  75 mcg Oral QAC breakfast  . metoprolol  100 mg Oral BID  . multivitamin  1 tablet Oral QHS  . simvastatin  5 mg Oral  q75  . vitamin C  500 mg Oral Daily   Continuous Infusions:   Principal Problem:   Acute encephalopathy Active Problems:    HYPOTHYROIDISM   Type II or unspecified type diabetes mellitus with renal manifestations, not stated as uncontrolled(250.40)   S/P aortic valve replacement   End stage renal disease   Accelerated hypertension   UTI (lower urinary tract infection)   Elevated troponin    Time spent: >35 min    Velvet Bathe  [facility] Pager [phone]. If 7PM-7AM, please contact night-coverage at [email or web address], password [facility] 03/24/2013, 4:59 PM  LOS: 2 days

## 2013-03-24 NOTE — Progress Notes (Addendum)
Central telemetry called; reported patient in accelerated junctional rhythm.  Patient currently resting comfortably.  MD on call paged.  Will continue to monitor.  Orders received for 12 lead EKG.

## 2013-03-24 NOTE — Consult Note (Signed)
[name] 03/24/2013 Rexene Agent Requesting Physician:  Wendee Beavers  Reason for Consult:  AMS, ESRD pt HPI: 80F ESRD MWF via AVF at [facility] admitted 3/18 with AMS and treated for UTI given TNTC WBC in urine with CTX.  AMS has improved and on my exam she is similar to her outpt self.    Last HD 3/16 with achieved EDW and good IDWG.  No recent events but admitted past winter with bacteremia, C diff, and debility.  Now living independently.  BP up Pre-Tx usually but normotensvie when done oftentimes.     Filed Weights   03/22/13 1927  Weight: 43.9 kg (96 lb 12.5 oz)    I/O last 3 completed shifts: In: 10 [P.O.:360; IV Piggyback:50] Out: -   ROS Balance of 12 systems is negative w/ exceptions as above  Outpt HD Orders Unit:  [facility] Days: MWF Time: 3h Dialyzer: F180 EDW: 46.5kg K/Ca: 2/2.25 Access: AVF LUA Needle Size: 15g Buttonhole BFR: 400 UF Proflie: none VDRA: none EPO: none IV Fe: none Heparin: 4200 qTx Last TSAT 17%, Ferritin 2299 Last Phos: 3.3 Last PTH: 142  PMH  Past Medical History  Diagnosis Date  . Anemia     NOS  . Personal history of colonic polyps   . GERD (gastroesophageal reflux disease)   . Hyperlipidemia     takes Simvastatin nightly  . Osteoarthritis   . Osteopenia   . Urinary incontinence   . Pulmonary hypertension   . Anxiety   . Renal insufficiency   . Coronary artery disease   . Aortic stenosis   . Hyperlipidemia   . Hypertension     takes Metoprolol daily  . Peripheral vascular disease   . CHF (congestive heart failure)   . Migraine     hx of  . Constipation   . Diarrhea   . Oligouria   . History of blood transfusion   . Diabetes mellitus     type II;was on Glipizide but has been off x 13mon  . Hypothyroidism     takes Synthroid daily  . Cancer     Mole on top of head to be removed in July 2013   [facility]  Past Surgical History  Procedure Laterality Date  . Abdominal hysterectomy    . Tonsillectomy    .  Cardiac catheterization  2000    cad  . Replacement total knee bilateral  05/1998  . Cataract extraction    . Coronary artery bypass graft      od  . Adenosine myoview  2007    benign, EF 69%  . Carotid endarterectomy  2011    Right  . Coronary artery bypass graft  2009  . Aortic valve replacement  2009  . Amputation-left great toe  7/12    Dr Marlou Sa  . Traumatic amputation of right dip joint of index finger    . Insertion of dialysis catheter  12/18/2010    Procedure: INSERTION OF DIALYSIS CATHETER;  Surgeon: Mal Misty, MD;  Location: [facility];  Service: Vascular;  Laterality: Right;  . Av fistula placement  12/22/2010    Procedure: ARTERIOVENOUS (AV) FISTULA CREATION;  Surgeon: Hinda Lenis, MD;  Location: [facility];  Service: Vascular;  Laterality: Left;  Creation of Left Brachial-Cephalic Fistula  . Appendectomy    . Eye surgery      BIlateral  . Amputation  06/18/2011    Procedure: AMPUTATION DIGIT;  Surgeon: Newt Minion, MD;  Location: [facility]  OR;  Service: Orthopedics;  Laterality: Left;  Left 2nd Toe Amputation at MTP Joint   FH History reviewed. No pertinent family history. SH  reports that she quit smoking about 70 years ago. Her smoking use included Cigarettes. She smoked 0.00 packs per day for 15 years. She has never used smokeless tobacco. She reports that she drinks alcohol. She reports that she does not use illicit drugs. Allergies  Allergies  Allergen Reactions  . Nitrofurantoin Itching  . Captopril Other (See Comments)    REACTION: unspecified  . Enalapril Maleate Cough  . Ramipril Other (See Comments)    REACTION: unspecified  . Sulfa Antibiotics Other (See Comments)    Reaction unknown  . Verapamil Other (See Comments)    REACTION: unspecified   Home medications Prior to Admission medications   Medication Sig Start Date End Date Taking? Authorizing Provider  aspirin EC 81 MG tablet Take 81 mg by mouth at bedtime.   Yes Historical Provider, MD  b  complex-vitamin c-folic acid (NEPHRO-VITE) 0.8 MG TABS Take 0.8 mg by mouth every Monday, Wednesday, and Friday.    Yes Historical Provider, MD  calcium carbonate (TUMS - DOSED IN MG ELEMENTAL CALCIUM) 500 MG chewable tablet Chew 1 tablet (200 mg of elemental calcium total) by mouth 3 (three) times daily with meals. 12/06/12  Yes Geradine Girt, DO  ciprofloxacin (CIPRO) 500 MG tablet Take 1 tablet (500 mg total) by mouth 2 (two) times daily. One po bid x 7 days 03/21/13  Yes Veryl Speak, MD  folic acid (FOLVITE) 0.5 MG tablet Take 0.5 tablets (0.5 mg total) by mouth daily. 12/06/12  Yes Geradine Girt, DO  levothyroxine (SYNTHROID, LEVOTHROID) 75 MCG tablet Take 75 mcg by mouth daily before breakfast.   Yes Historical Provider, MD  metoprolol (LOPRESSOR) 50 MG tablet Take 75 mg by mouth 2 (two) times daily.   Yes Historical Provider, MD  pravastatin (PRAVACHOL) 10 MG tablet Take 10 mg by mouth at bedtime.   Yes Historical Provider, MD  vitamin C (VITAMIN C) 500 MG tablet Take 1 tablet (500 mg total) by mouth daily. 12/06/12  Yes Geradine Girt, DO    Current Medications Current facility-administered medications:acetaminophen (TYLENOL) tablet 650 mg, 650 mg, Oral, Q4H PRN, Rise Patience, MD;  amLODipine (NORVASC) tablet 5 mg, 5 mg, Oral, Daily, Rise Patience, MD, 5 mg at 03/23/13 1134;  aspirin EC tablet 81 mg, 81 mg, Oral, QHS, Rise Patience, MD, 81 mg at 03/23/13 2158 calcium carbonate (TUMS - dosed in mg elemental calcium) chewable tablet 200 mg of elemental calcium, 1 tablet, Oral, TID WC, Rise Patience, MD, 200 mg of elemental calcium at 03/24/13 0921;  cefTRIAXone (ROCEPHIN) 1 g in dextrose 5 % 50 mL IVPB, 1 g, Intravenous, Q24H, Rise Patience, MD, 1 g at 03/23/13 2157;  enoxaparin (LOVENOX) injection 30 mg, 30 mg, Subcutaneous, Q24H, Rise Patience, MD, 30 mg at [id number] folic acid (FOLVITE) tablet 0.5 mg, 0.5 mg, Oral, Daily, Rise Patience, MD, 0.5 mg  at 03/23/13 1133;  insulin aspart (novoLOG) injection 0-9 Units, 0-9 Units, Subcutaneous, TID WC, Rise Patience, MD, 1 Units at 03/24/13 [id number];  labetalol (NORMODYNE,TRANDATE) injection 10 mg, 10 mg, Intravenous, Q2H PRN, Rise Patience, MD levothyroxine (SYNTHROID, LEVOTHROID) tablet 75 mcg, 75 mcg, Oral, QAC breakfast, Rise Patience, MD, 75 mcg at 03/23/13 1134;  metoprolol tartrate (LOPRESSOR) tablet 75 mg, 75 mg, Oral, BID, Rise Patience, MD, 75 mg  at 03/23/13 2158;  multivitamin (RENA-VIT) tablet 1 tablet, 1 tablet, Oral, QHS, Rise Patience, MD, 1 tablet at 03/23/13 2157 simvastatin (ZOCOR) tablet 5 mg, 5 mg, Oral, q1800, Rise Patience, MD, 5 mg at 03/23/13 1823;  vitamin C (ASCORBIC ACID) tablet 500 mg, 500 mg, Oral, Daily, Rise Patience, MD, 500 mg at 03/23/13 1134  CBC  Recent Labs Lab 03/20/13 1930 03/22/13 2235 03/23/13 0540  WBC 8.9 8.1 6.3  NEUTROABS  --   --  3.6  HGB 13.0 12.0 11.8*  HCT 38.7 37.0 36.8  MCV 95.6 96.9 98.4  PLT 276 233 353   Basic Metabolic Panel  Recent Labs Lab 03/20/13 1930 03/22/13 2235 03/23/13 0540  NA 135*  --  138  K 5.1  --  5.1  CL 90*  --  93*  CO2 26  --  27  GLUCOSE 111*  --  106*  BUN 34*  --  39*  CREATININE 2.29* 2.38* 3.07*  CALCIUM 10.0  --  9.5  PHOS  --   --  5.4*    Physical Exam  Blood pressure 185/60, pulse 75, temperature 97.9 F (36.6 C), temperature source Oral, resp. rate 16, weight 43.9 kg (96 lb 12.5 oz), SpO2 96.00%. GEN: NAD, pleasant ENT: NCAT, scars from [facility] present EYES: EOMI, glasses CV: RRR PULM: CTAB ABD: s/nt/nd SKIN: as bove, scars EXT:no LEE   A/P 1. ESRD: keep on MWF schedule 2. AMS / UTI: much improved with ABX.  Appears at baseline 3. HTN/Vol: 1L UF today.  BP very responsive to Hd 4. Anemia: stable, monitor 5. MBD: stable as outpt.  At goals  Pearson Grippe MD 03/24/2013, 10:53 AM

## 2013-03-24 NOTE — Progress Notes (Signed)
Patient's blood pressure 185/55.  Currently resting comfortably, and has no complaints of any symptoms.  MD on call notified; awaiting orders/call back.  Will continue to monitor patient.

## 2013-03-24 NOTE — Clinical Documentation Improvement (Signed)
PLEASE SPECIFY TYPE AND ACUITY OF CHF Possible Clinical Conditions?  Chronic Systolic Congestive Heart Failure Chronic Diastolic Congestive Heart Failure Chronic Systolic & Diastolic Congestive Heart Failure Acute Systolic Congestive Heart Failure Acute Diastolic Congestive Heart Failure Acute Systolic & Diastolic Congestive Heart Failure Acute on Chronic Systolic Congestive Heart Failure Acute on Chronic Diastolic Congestive Heart Failure Acute on Chronic Systolic & Diastolic Congestive Heart Failure Other Condition Cannot Clinically Determine  Supporting Information: (As per notes) Pt has hx of CHF  2D ECHO 11-22-12 ------------------------------------------------------------ Study Conclusions - Left ventricle: The cavity size was normal. Wall thickness was increased in a pattern of mild LVH. Systolic function was normal. The estimated ejection fraction was in the range of 55% to 60%. Wall motion was normal; there were no regional wall motion abnormalities. Features are consistent with a pseudonormal left ventricular filling pattern, with concomitant abnormal relaxation and increased filling pressure (grade 2 diastolic dysfunction). Doppler parameters are consistent with high ventricular filling pressure. - Aortic valve: A bioprosthesis was present. There was mild stenosis. - Mitral valve: Calcified annulus. Mildly thickened leaflets . Mild regurgitation. - Left atrium: The atrium was mildly dilated. - Right atrium: The atrium was mildly dilated. - Tricuspid valve: Severe regurgitation. - Pulmonary arteries: Systolic pressure was moderately increased. PA peak pressure: 27mm Hg (S). Impressions: - No significant change since 08/18/10; normal LV function and normally functioning bioprosthetic aortic valve with normal mean gradient.  Thank You, Alessandra Grout, RN, BSN, CCDS, Clinical Documentation Specialist:  [phone]   Cell= [phone] [place]  Information Management

## 2013-03-25 LAB — BASIC METABOLIC PANEL
BUN: 35 mg/dL — ABNORMAL HIGH (ref 6–23)
CALCIUM: 9.4 mg/dL (ref 8.4–10.5)
CO2: 27 meq/L (ref 19–32)
CREATININE: 3.63 mg/dL — AB (ref 0.50–1.10)
Chloride: 90 mEq/L — ABNORMAL LOW (ref 96–112)
GFR calc Af Amer: 12 mL/min — ABNORMAL LOW (ref >90)
GFR calc non Af Amer: 10 mL/min — ABNORMAL LOW (ref >90)
Glucose, Bld: 103 mg/dL — ABNORMAL HIGH (ref 70–99)
Potassium: 4.6 mEq/L (ref 3.7–5.3)
Sodium: 132 mEq/L — ABNORMAL LOW (ref 137–147)

## 2013-03-25 LAB — GLUCOSE, CAPILLARY
Glucose-Capillary: 113 mg/dL — ABNORMAL HIGH (ref 70–99)
Glucose-Capillary: 148 mg/dL — ABNORMAL HIGH (ref 70–99)
Glucose-Capillary: 226 mg/dL — ABNORMAL HIGH (ref 70–99)

## 2013-03-25 NOTE — Progress Notes (Signed)
[facility] PROGRESS NOTE  [name] QIO:[id number] DOB: Jan 30, 1920 DOA: 03/22/2013 PCP: Viviana Simpler, MD  Assessment/Plan: 1. Acute encephalopathy - Improving, A & Ox 3 - Most likely secondary to UTI and uncontrolled hypertension. Improving daily - Ceftriaxone day 4 - Blood cultures NTD - TSH 0.998 on 3/18 - Swallow evaluation diet recommendation: regular thin liquid - Physical therapy reports no PT follow up with intermittent supervision.  2. Accelerated hypertension - Improving - BP not well controlled today.  Recently increased amlodipine and B blocker dose. If blood pressure remains significantly elevated will have to adjust medications further.    3. UTI - Ceftriaxone day 3  4. ESRD on hemodialysis - Schedule: Monday, Wednesday, and Friday - Consulted nephrology for dialysis - Cr 3.07 on 3/19  5. Elevated troponin - Troponin elevated x2, neg x 1 - Pt denies any chest pain - EKG: SR, no ST elevations/depressions - Cardiology consulted: elevations likely related to renal insufficiency, no further ischemia evaluation recommended. Pt to follow up with Dr. Rockey Situ following discharge  6. CAD s/p CABG and aortic valve replacement procine - Denies any chest pain - Continue prophylaxis: lovenox - Continue ASA and statin  7. Hyperlipidemia - Continue statin  8. Diabetes mellitus type 2 - Follow CBGs - SSI  9. Hypothyroidism - Continue synthroid  10. Recent admission for MSRA bacteremia and C. Difficile in Dec 2014 - Monitor, no diarrhea at this time  DVT prevention: Lovenox  Code Status: FULL Family Communication: Spoke with patient. No family at bedside Disposition Plan: Pending further improvement   Consultants:  Cardiology  Procedures:  CT head: chronic ischemic change and atrophy. No acute abnormality  Antibiotics:  Rocephin (started: ) day 4  HPI/Subjective: Pt has no new complaints. No acute issues reported overnight.    Objective: Filed Vitals:   03/25/13 1100  BP: 177/55  Pulse: 69  Temp: 97.9 F (36.6 C)  Resp: 19    Intake/Output Summary (Last 24 hours) at 03/25/13 1611 Last data filed at 03/25/13 1400  Gross per 24 hour  Intake    480 ml  Output   1000 ml  Net   -520 ml   Filed Weights   03/22/13 1927 03/24/13 1135 03/24/13 1615  Weight: 43.9 kg (96 lb 12.5 oz) 44.4 kg (97 lb 14.2 oz) 43.4 kg (95 lb 10.9 oz)    Exam:   General:  Pt in NAD, Alert and awake, oriented x 3  Cardiovascular: RRR, no MRG  Respiratory: CTA BL, no wheezes   Abdomen: soft, NT, ND, + bowel sounds  Musculoskeletal: no cyanosis or clubbing   Data Reviewed: Basic Metabolic Panel:  Recent Labs Lab 03/20/13 1930 03/22/13 2235 03/23/13 0540 03/25/13 1218  NA 135*  --  138 132*  K 5.1  --  5.1 4.6  CL 90*  --  93* 90*  CO2 26  --  27 27  GLUCOSE 111*  --  106* 103*  BUN 34*  --  39* 35*  CREATININE 2.29* 2.38* 3.07* 3.63*  CALCIUM 10.0  --  9.5 9.4  MG  --   --  2.1  --   PHOS  --   --  5.4*  --    Liver Function Tests:  Recent Labs Lab 03/23/13 0540  AST 25  ALT 18  ALKPHOS 167*  BILITOT 0.7  PROT 8.1  ALBUMIN 3.2*   No results found for this basename: LIPASE, AMYLASE,  in the last 168 hours  Recent Labs  Lab 03/22/13 2234  AMMONIA 33   CBC:  Recent Labs Lab 03/20/13 1930 03/22/13 2235 03/23/13 0540  WBC 8.9 8.1 6.3  NEUTROABS  --   --  3.6  HGB 13.0 12.0 11.8*  HCT 38.7 37.0 36.8  MCV 95.6 96.9 98.4  PLT 276 233 235   Cardiac Enzymes:  Recent Labs Lab 03/22/13 2234 03/22/13 2235 03/23/13 0540 03/23/13 1050  CKTOTAL  --  47  --   --   TROPONINI 0.37*  --  0.41* <0.30   BNP (last 3 results)  Recent Labs  03/20/13 1850  PROBNP 21236.0*   CBG:  Recent Labs Lab 03/24/13 0733 03/24/13 1138 03/24/13 1657 03/24/13 2034 03/25/13 1205  GLUCAP 122* 157* 85 209* 113*    Recent Results (from the past 240 hour(s))  MRSA PCR SCREENING     Status: None    Collection Time    03/22/13  7:59 PM      Result Value Ref Range Status   MRSA by PCR NEGATIVE  NEGATIVE Final   Comment:            The GeneXpert MRSA Assay (FDA     approved for NASAL specimens     only), is one component of a     comprehensive MRSA colonization     surveillance program. It is not     intended to diagnose MRSA     infection nor to guide or     monitor treatment for     MRSA infections.  CULTURE, BLOOD (ROUTINE X 2)     Status: None   Collection Time    03/22/13 10:34 PM      Result Value Ref Range Status   Specimen Description BLOOD RIGHT HAND   Final   Special Requests BOTTLES DRAWN AEROBIC ONLY 4CC   Final   Culture  Setup Time     Final   Value: 03/23/2013 03:32     Performed at [facility]   Culture     Final   Value:        BLOOD CULTURE RECEIVED NO GROWTH TO DATE CULTURE WILL BE HELD FOR 5 DAYS BEFORE ISSUING A FINAL NEGATIVE REPORT     Performed at [facility]   Report Status PENDING   Incomplete  CULTURE, BLOOD (ROUTINE X 2)     Status: None   Collection Time    03/22/13 10:40 PM      Result Value Ref Range Status   Specimen Description BLOOD RIGHT FINGER   Final   Special Requests BOTTLES DRAWN AEROBIC ONLY 2CC RT INDEX FINGER   Final   Culture  Setup Time     Final   Value: 03/23/2013 03:32     Performed at [facility]   Culture     Final   Value:        BLOOD CULTURE RECEIVED NO GROWTH TO DATE CULTURE WILL BE HELD FOR 5 DAYS BEFORE ISSUING A FINAL NEGATIVE REPORT     Performed at [facility]   Report Status PENDING   Incomplete     Studies: No results found.  Scheduled Meds: . amLODipine  10 mg Oral Daily  . aspirin EC  81 mg Oral QHS  . calcium carbonate  1 tablet Oral TID WC  . cefTRIAXone (ROCEPHIN)  IV  1 g Intravenous Q24H  . enoxaparin (LOVENOX) injection  30 mg Subcutaneous Q24H  . folic acid  0.5 mg Oral Daily  .  insulin aspart  0-9 Units Subcutaneous TID WC  . levothyroxine  75 mcg Oral QAC  breakfast  . metoprolol  100 mg Oral BID  . multivitamin  1 tablet Oral QHS  . simvastatin  5 mg Oral q1800  . vitamin C  500 mg Oral Daily   Continuous Infusions:   Principal Problem:   Acute encephalopathy Active Problems:   HYPOTHYROIDISM   Type II or unspecified type diabetes mellitus with renal manifestations, not stated as uncontrolled(250.40)   S/P aortic valve replacement   End stage renal disease   Accelerated hypertension   UTI (lower urinary tract infection)   Elevated troponin    Time spent: >35 min    Velvet Bathe  [facility] Pager [phone]. If 7PM-7AM, please contact night-coverage at [email or web address], password [facility] 03/25/2013, 4:11 PM  LOS: 3 days

## 2013-03-25 NOTE — Progress Notes (Signed)
[facility] Progress Note  Subjective:   C/o they won't let me get up to go to the bathroom; can eat when the food is good. Wants to go home.  Objective Filed Vitals:   03/24/13 2036 03/25/13 0011 03/25/13 0439 03/25/13 0831  BP: 114/75 155/56 172/55 155/45  Pulse: 76 64 65 51  Temp: 98.6 F (37 C) 98.6 F (37 C) 98.4 F (36.9 C) 97.6 F (36.4 C)  TempSrc: Oral Oral Oral Oral  Resp: 18 16 16 17   Height:      Weight:      SpO2: 96% 97% 95% 94%   Physical Exam General: NAD Heart: RRR Lungs: no wheezes or rales Abdomen: soft NT Extremities: no sig edema Dialysis Access: left AVF patent  Assessment/Plan: 1. AMS  -resolved - at baseline; discussed mobility issues with nursing 2. UTI - on rocephin 3. ESRD - MWF  4. Anemia - Hgb 11.8 no ESA  5. Secondary hyperparathyroidism - no vit D 6. HTN/volume -  Net UF 1 L - post weight 43.4 kg; BP variable - improved; continue to challenge volume on HD; norvasc 10 metoprolol BID; likely will have slightly lower EDW at d/c 7. Nutrition - renal diet 8. CAD s/p CABG 9. DM - BS 85 - 200 10. Hx MRSA and Cdiff - 12/2012  Myriam Jacobson, PA-C [place] [phone] 03/25/2013,9:28 AM   LOS: 3 days   Renal Attending: Pt stable hemodynamically. No acute issues at this time and appears at baseline as articulated above. Joann Jorge C    Additional Objective Labs:  Outpt HD Orders  Unit: [place] [facility] Days: MWF  Time: 3h  Dialyzer: F180  EDW: 46.5kg  K/Ca: 2/2.25  Access: AVF LUA  Needle Size: 15g Buttonhole  BFR: 400  UF Proflie: none  VDRA: none  EPO: none  IV Fe: none  Heparin: 4200 qTx Last TSAT 17%, Ferritin 2299  Last Phos: 3.3  Last PTH: 425  Basic Metabolic Panel:  Recent Labs Lab 03/20/13 1930 03/22/13 2235 03/23/13 0540  NA 135*  --  138  K 5.1  --  5.1  CL 90*  --  93*  CO2 26  --  27  GLUCOSE 111*  --  106*  BUN 34*  --  39*  CREATININE 2.29* 2.38* 3.07*   CALCIUM 10.0  --  9.5  PHOS  --   --  5.4*   Liver Function Tests:  Recent Labs Lab 03/23/13 0540  AST 25  ALT 18  ALKPHOS 167*  BILITOT 0.7  PROT 8.1  ALBUMIN 3.2*   CBC:  Recent Labs Lab 03/20/13 1930 03/22/13 2235 03/23/13 0540  WBC 8.9 8.1 6.3  NEUTROABS  --   --  3.6  HGB 13.0 12.0 11.8*  HCT 38.7 37.0 36.8  MCV 95.6 96.9 98.4  PLT 276 233 235   Blood Culture    Component Value Date/Time   SDES BLOOD RIGHT FINGER 03/22/2013 2240   SPECREQUEST BOTTLES DRAWN AEROBIC ONLY 2CC RT INDEX FINGER 03/22/2013 2240   CULT  Value:        BLOOD CULTURE RECEIVED NO GROWTH TO DATE CULTURE WILL BE HELD FOR 5 DAYS BEFORE ISSUING A FINAL NEGATIVE REPORT Performed at [facility] 03/22/2013 2240   REPTSTATUS PENDING 03/22/2013 2240    Cardiac Enzymes:  Recent Labs Lab 03/22/13 2234 03/22/13 2235 03/23/13 0540 03/23/13 1050  CKTOTAL  --  47  --   --   TROPONINI 0.37*  --  0.41* <0.30   CBG:  Recent Labs Lab 03/23/13 2119 03/24/13 0733 03/24/13 1138 03/24/13 1657 03/24/13 2034  GLUCAP 144* 122* 157* 85 209*  Medications:   . amLODipine  10 mg Oral Daily  . aspirin EC  81 mg Oral QHS  . calcium carbonate  1 tablet Oral TID WC  . cefTRIAXone (ROCEPHIN)  IV  1 g Intravenous Q24H  . enoxaparin (LOVENOX) injection  30 mg Subcutaneous Q24H  . folic acid  0.5 mg Oral Daily  . insulin aspart  0-9 Units Subcutaneous TID WC  . levothyroxine  75 mcg Oral QAC breakfast  . metoprolol  100 mg Oral BID  . multivitamin  1 tablet Oral QHS  . simvastatin  5 mg Oral q1800  . vitamin C  500 mg Oral Daily

## 2013-03-25 NOTE — Evaluation (Signed)
Physical Therapy Evaluation Patient Details Name: [name] MRN: [id number] DOB: 05-28-1920 Today's Date: 03/25/2013 Time: 5366-4403 PT Time Calculation (min): 17 min  PT Assessment / Plan / Recommendation History of Present Illness  [name] is a 80 y.o. female history of ESRD on hemodialysis Monday Wednesday and Friday, history of aortic valve replacement porcine, CAD status post CABG, diabetes mellitus on diet, hypothyroidism was brought to the ER at [facility] after patient was found to be confused during dialysis today.  Patient with UTI possibly causing AMS per chart.   Clinical Impression  Patient is functioning at mod I level with mobility and gait.  Close to baseline functional level.  Patient has all equipment needed at home.  No acute PT needs identified - PT will sign off.  Encouraged ambulation in hallway with RW and nursing.    PT Assessment  Patent does not need any further PT services    Follow Up Recommendations  No PT follow up;Supervision - Intermittent    Does the patient have the potential to tolerate intense rehabilitation      Barriers to Discharge        Equipment Recommendations  None recommended by PT    Recommendations for Other Services     Frequency      Precautions / Restrictions Precautions Precautions: Fall Precaution Comments: Patient reports she has had a few falls at home, tripping over things.  Talked to patient and son about removing any rugs and making sure pathways are clear in house. Restrictions Weight Bearing Restrictions: No   Pertinent Vitals/Pain       Mobility  Bed Mobility Overal bed mobility: Modified Independent General bed mobility comments: Able to move to sitting with increased time. Transfers Overall transfer level: Modified independent Equipment used: Rolling walker (2 wheeled) Transfers: Sit to/from Stand Sit to Stand: Modified independent (Device/Increase time) General transfer comment:  Patient demonstrates safe transfers with use of RW.  Good balance in standing. Ambulation/Gait Ambulation/Gait assistance: Modified independent (Device/Increase time) Ambulation Distance (Feet): 180 Feet Assistive device: Rolling walker (2 wheeled) Gait Pattern/deviations: Step-through pattern;Decreased stride length Gait velocity: Slow gait speed Gait velocity interpretation: Below normal speed for age/gender General Gait Details: Patient demonstrates safe use of RW with gait.  Good balance with use of RW.        PT Goals(Current goals can be found in the care plan section)    Visit Information  Last PT Received On: 03/25/13 Assistance Needed: +1 History of Present Illness: [name] is a 80 y.o. female history of ESRD on hemodialysis Monday Wednesday and Friday, history of aortic valve replacement porcine, CAD status post CABG, diabetes mellitus on diet, hypothyroidism was brought to the ER at [facility] after patient was found to be confused during dialysis today.  Patient with UTI possibly causing AMS per chart.        Prior [place] expects to be discharged to:: Private residence Living Arrangements: Alone Available Help at Discharge: Family;Available PRN/intermittently Type of Home: House Home Access: Stairs to enter [employer] of Steps: 3 Entrance Stairs-Rails: Right;Left Home Layout: Two level;Laundry or work area in basement Alternate Therapist, sports of Steps: flight Alternate Level Stairs-Rails: Right;Left Home Equipment: Environmental consultant - 2 wheels;Walker - 4 wheels;Cane - single point;Bedside commode;Wheelchair - manual Additional Comments: Pt reports she was able to ambulate down to basement and back up to main floor with hand rails  Prior Function Level of Independence: Independent with  assistive device(s) Comments: Uses RW or cane for safety Communication Communication: HOH Dominant Hand: Right     Cognition  Cognition Arousal/Alertness: Awake/alert Behavior During Therapy: WFL for tasks assessed/performed Overall Cognitive Status: Within Functional Limits for tasks assessed    Extremity/Trunk Assessment Upper Extremity Assessment Upper Extremity Assessment: Overall WFL for tasks assessed Lower Extremity Assessment Lower Extremity Assessment: Generalized weakness   Balance    End of Session PT - End of Session Equipment Utilized During Treatment: Gait belt Activity Tolerance: Patient tolerated treatment well Patient left: in bed;with call bell/phone within reach;with bed alarm set;with family/visitor present Nurse Communication: Mobility status (Encouraged ambulation in hallway with nursing)  GP     Despina Pole 03/25/2013, 3:50 PM Carita Pian. Sanjuana Kava, [place] Pager [phone]

## 2013-03-26 LAB — GLUCOSE, CAPILLARY: GLUCOSE-CAPILLARY: 143 mg/dL — AB (ref 70–99)

## 2013-03-26 MED ORDER — CEFDINIR 300 MG PO CAPS: 300.0000 mg | ORAL_CAPSULE | Freq: Two times a day (BID) | ORAL | Status: DC

## 2013-03-26 MED ORDER — METOPROLOL TARTRATE 50 MG PO TABS: 75.0000 mg | ORAL_TABLET | Freq: Two times a day (BID) | ORAL | Status: DC

## 2013-03-26 NOTE — Progress Notes (Signed)
Pt escorted out via wheelchair accompanied by charge RN.

## 2013-03-26 NOTE — Progress Notes (Signed)
Pt discharged per MD. Discharge instructions provided to son per pt request. Son verbalized s/sx to report to MD. Prescriptions provided. NSL d/c'd with cath intact. Manya Silvas, RN

## 2013-03-26 NOTE — Discharge Summary (Signed)
Physician Discharge Summary  [name] TGG:[id number] DOB: Dec 22, 1920 DOA: 03/22/2013  PCP: Viviana Simpler, MD  Admit date: 03/22/2013 Discharge date: 03/26/2013  Time spent: > 35 minutes  Recommendations for Outpatient Follow-up:  1. Please follow up with patient's blood pressures and adjust antihypertensive medication appropriately  Discharge Diagnoses:  Principal Problem:   Acute encephalopathy Active Problems:   HYPOTHYROIDISM   Type II or unspecified type diabetes mellitus with renal manifestations, not stated as uncontrolled(250.40)   S/P aortic valve replacement   End stage renal disease   Accelerated hypertension   UTI (lower urinary tract infection)   Elevated troponin   Discharge Condition: stable  Diet recommendation: Low sodium/heart healthy  Filed Weights   03/22/13 1927 03/24/13 1135 03/24/13 1615  Weight: 43.9 kg (96 lb 12.5 oz) 44.4 kg (97 lb 14.2 oz) 43.4 kg (95 lb 10.9 oz)    History of present illness:  [name] is a 80 y.o. female history of ESRD on hemodialysis Monday Wednesday and Friday, history of aortic valve replacement porcine, CAD status post CABG, diabetes mellitus on diet, hypothyroidism was brought to the ER at [facility] after patient was found to be confused during dialysis.   Hospital Course:  1. Acute encephalopathy -Resolved and thought to have been secondary to UTI - improving on antibiotics will continue on discharge.  2. Accelerated hypertension - stable given bradycardia will place on admission dose of B blocker  3. UTI - Will treat for 7 days total with 3rd generation cephalosporin  4. ESRD on hemodialysis - Schedule: Monday, Wednesday, and Friday  - Nephrology managing in patient and outpatient.  5. Elevated troponin - Troponin elevated x2, neg x 1  - Pt denies any chest pain  - EKG: SR, no ST elevations/depressions  - Cardiology consulted: elevations likely related to renal insufficiency, no further  ischemia evaluation recommended. Pt to follow up with Dr. Rockey Situ following discharge   6. CAD s/p CABG and aortic valve replacement procine - Denies any chest pain  - Continue prophylaxis: lovenox  - Continue ASA and statin   7. Hyperlipidemia - Continue statin   8. Diabetes mellitus type 2 - Pt to continue home regimen and diabetic diet  9. Hypothyroidism - Continue synthroid   10. Recent admission for MSRA bacteremia and C. Difficile in Dec 2014 - Blood cultures: NTD this admission   Procedures:  HD  Consultations:  Nephrology  Discharge Exam: Filed Vitals:   03/26/13 0900  BP: 112/48  Pulse: 65  Temp: 98.9 F (37.2 C)  Resp: 18    General: Pt in NAD, Alert and awake Cardiovascular: normal s1 and s2, no rubs Respiratory: CTA BL, no wheezes  Discharge Instructions  Discharge Orders   Future Appointments Provider Department Dept Phone   04/25/2013 11:15 AM [name], MD [place] [facility] [place]   Future Orders Complete By Expires   Call MD for:  redness, tenderness, or signs of infection (pain, swelling, redness, odor or green/yellow discharge around incision site)  As directed    Call MD for:  severe uncontrolled pain  As directed    Call MD for:  temperature >100.4  As directed    Diet - low sodium heart healthy  As directed    Increase activity slowly  As directed        Medication List    STOP taking these medications       ciprofloxacin 500 MG tablet  Commonly known as:  CIPRO      TAKE these medications       ascorbic acid 500 MG tablet  Commonly known as:  VITAMIN C  Take 1 tablet (500 mg total) by mouth daily.     aspirin EC 81 MG tablet  Take 81 mg by mouth at bedtime.     b complex-vitamin c-folic acid 0.8 MG Tabs tablet  Take 0.8 mg by mouth every Monday, Wednesday, and Friday.     calcium carbonate 500 MG chewable tablet  Commonly known as:  TUMS - dosed in mg elemental calcium  Chew 1 tablet (200  mg of elemental calcium total) by mouth 3 (three) times daily with meals.     cefdinir 300 MG capsule  Commonly known as:  OMNICEF  Take 1 capsule (300 mg total) by mouth 2 (two) times daily.     folic acid 0.5 MG tablet  Commonly known as:  FOLVITE  Take 0.5 tablets (0.5 mg total) by mouth daily.     levothyroxine 75 MCG tablet  Commonly known as:  SYNTHROID, LEVOTHROID  Take 75 mcg by mouth daily before breakfast.     metoprolol 50 MG tablet  Commonly known as:  LOPRESSOR  Take 1.5 tablets (75 mg total) by mouth 2 (two) times daily.     pravastatin 10 MG tablet  Commonly known as:  PRAVACHOL  Take 10 mg by mouth at bedtime.       Allergies  Allergen Reactions  . Nitrofurantoin Itching  . Captopril Other (See Comments)    REACTION: unspecified  . Enalapril Maleate Cough  . Ramipril Other (See Comments)    REACTION: unspecified  . Sulfa Antibiotics Other (See Comments)    Reaction unknown  . Verapamil Other (See Comments)    REACTION: unspecified      The results of significant diagnostics from this hospitalization (including imaging, microbiology, ancillary and laboratory) are listed below for reference.    Significant Diagnostic Studies: Dg Chest 2 View  03/20/2013   CLINICAL DATA:  Altered mental status.  EXAM: CHEST  2 VIEW  COMPARISON:  November 20, 2012.  FINDINGS: Status post coronary artery bypass graft. Hyperinflation of the lungs is noted suggesting chronic obstructive pulmonary disease. No pleural effusion or pneumothorax is noted. No acute pulmonary disease is noted. Bony thorax is intact.  IMPRESSION: No acute cardiopulmonary abnormality seen.   Electronically Signed   By: Sabino Dick M.D.   On: 03/20/2013 20:03   Ct Head Wo Contrast  03/21/2013   CLINICAL DATA:  Weak and lethargic.  EXAM: CT HEAD WITHOUT CONTRAST  TECHNIQUE: Contiguous axial images were obtained from the base of the skull through the vertex without intravenous contrast.  COMPARISON:  CT  01/29/2007.  FINDINGS: No mass. No hydrocephalus. No hemorrhage. White matter changes noted consistent with chronic ischemic. Old basal ganglia lacunar infarcts. Diffuse cerebral atrophy. Cerebral vascular calcification. Orbits intact. Visualized paranasal sinuses and mastoids clear. No acute bony abnormality.  IMPRESSION: Chronic ischemic change and atrophy.  No acute abnormality.   Electronically Signed   By: Marcello Moores  Register   On: 03/21/2013 01:21    Microbiology: Recent Results (from the past 240 hour(s))  MRSA PCR SCREENING     Status: None   Collection Time    03/22/13  7:59 PM      Result Value Ref Range Status   MRSA by PCR NEGATIVE  NEGATIVE Final   Comment:            The  GeneXpert MRSA Assay (FDA     approved for NASAL specimens     only), is one component of a     comprehensive MRSA colonization     surveillance program. It is not     intended to diagnose MRSA     infection nor to guide or     monitor treatment for     MRSA infections.  CULTURE, BLOOD (ROUTINE X 2)     Status: None   Collection Time    03/22/13 10:34 PM      Result Value Ref Range Status   Specimen Description BLOOD RIGHT HAND   Final   Special Requests BOTTLES DRAWN AEROBIC ONLY 4CC   Final   Culture  Setup Time     Final   Value: 03/23/2013 03:32     Performed at [facility]   Culture     Final   Value:        BLOOD CULTURE RECEIVED NO GROWTH TO DATE CULTURE WILL BE HELD FOR 5 DAYS BEFORE ISSUING A FINAL NEGATIVE REPORT     Performed at [facility]   Report Status PENDING   Incomplete  CULTURE, BLOOD (ROUTINE X 2)     Status: None   Collection Time    03/22/13 10:40 PM      Result Value Ref Range Status   Specimen Description BLOOD RIGHT FINGER   Final   Special Requests BOTTLES DRAWN AEROBIC ONLY 2CC RT INDEX FINGER   Final   Culture  Setup Time     Final   Value: 03/23/2013 03:32     Performed at [facility]   Culture     Final   Value:        BLOOD CULTURE  RECEIVED NO GROWTH TO DATE CULTURE WILL BE HELD FOR 5 DAYS BEFORE ISSUING A FINAL NEGATIVE REPORT     Performed at [facility]   Report Status PENDING   Incomplete     Labs: Basic Metabolic Panel:  Recent Labs Lab 03/20/13 1930 03/22/13 2235 03/23/13 0540 03/25/13 1218  NA 135*  --  138 132*  K 5.1  --  5.1 4.6  CL 90*  --  93* 90*  CO2 26  --  27 27  GLUCOSE 111*  --  106* 103*  BUN 34*  --  39* 35*  CREATININE 2.29* 2.38* 3.07* 3.63*  CALCIUM 10.0  --  9.5 9.4  MG  --   --  2.1  --   PHOS  --   --  5.4*  --    Liver Function Tests:  Recent Labs Lab 03/23/13 0540  AST 25  ALT 18  ALKPHOS 167*  BILITOT 0.7  PROT 8.1  ALBUMIN 3.2*   No results found for this basename: LIPASE, AMYLASE,  in the last 168 hours  Recent Labs Lab 03/22/13 2234  AMMONIA 33   CBC:  Recent Labs Lab 03/20/13 1930 03/22/13 2235 03/23/13 0540  WBC 8.9 8.1 6.3  NEUTROABS  --   --  3.6  HGB 13.0 12.0 11.8*  HCT 38.7 37.0 36.8  MCV 95.6 96.9 98.4  PLT 276 233 235   Cardiac Enzymes:  Recent Labs Lab 03/22/13 2234 03/22/13 2235 03/23/13 0540 03/23/13 1050  CKTOTAL  --  47  --   --   TROPONINI 0.37*  --  0.41* <0.30   BNP: BNP (last 3 results)  Recent Labs  03/20/13 1850  PROBNP 21236.0*  CBG:  Recent Labs Lab 03/24/13 2034 03/25/13 1205 03/25/13 1633 03/25/13 2037 03/26/13 0741  GLUCAP 209* 113* 148* 226* 143*       Signed:  Velvet Bathe  [facility] 03/26/2013, 10:48 AM

## 2013-03-26 NOTE — Progress Notes (Signed)
[name] Progress Note  Subjective:   Feels good. Breakfast better today.  Objective Filed Vitals:   03/25/13 1100 03/25/13 1500 03/25/13 2038 03/26/13 0432  BP: 177/55 141/45 144/52 174/52  Pulse: 69 70 67 65  Temp: 97.9 F (36.6 C) 98.2 F (36.8 C) 98.2 F (36.8 C) 97.9 F (36.6 C)  TempSrc: Oral Oral Oral Oral  Resp: 19 21 18 16   Height:      Weight:      SpO2: 100% 100% 98% 99%   Physical Exam General:NAD sitting on side of bed eating breakfast Heart: RRR Lungs: clear with good expansion Abdomen: soft Extremities: no LE edema Dialysis Access: left AVF patent   Outpt HD Orders  Unit: [place] FMCDays: MWF Time: 3h Dialyzer: F180 EDW: 46.5kg K/Ca: 2/2.25 Access: AVF LUA Needle Size: 15g Buttonhole BFR: 400 UF Proflie: none VDRA: none EPO: none IV Fe: none Heparin: 4200 qTx Last TSAT 17%, Ferritin 2299  Last Phos: 3.3  Last PTH: 142  Assessment/Plan:  1. AMS -resolved mental and functional status at baseline; discussed mobility issues with nursing  2. UTI - on rocephin  3. ESRD - MWF - plan first round HD and challenge gently - need standing weights 4. Anemia - Hgb 11.8 3/19  no ESA  5. Secondary hyperparathyroidism - no vit D  6. HTN/volume - Net UF 1 L - post weight 43.4 kg; BP variable - improved; continue to challenge volume on HD; norvasc 10 metoprolol BID; likely will have slightly lower EDW at d/c  7. Nutrition - renal diet  8. CAD s/p CABG ^ trop 2/3 - seen by cards - likely renal related 9. DM - BS 85 - 200  10. Hx MRSA and Cdiff - 12/2012  [name] Jacobson, PA-C [name] [phone] 03/26/2013,8:28 AM  LOS: 4 days    Additional Objective Labs: Basic Metabolic Panel:  Recent Labs Lab 03/20/13 1930 03/22/13 2235 03/23/13 0540 03/25/13 1218  NA 135*  --  138 132*  K 5.1  --  5.1 4.6  CL 90*  --  93* 90*  CO2 26  --  27 27  GLUCOSE 111*  --  106* 103*  BUN 34*  --  39* 35*  CREATININE 2.29* 2.38*  3.07* 3.63*  CALCIUM 10.0  --  9.5 9.4  PHOS  --   --  5.4*  --    Liver Function Tests:  Recent Labs Lab 03/23/13 0540  AST 25  ALT 18  ALKPHOS 167*  BILITOT 0.7  PROT 8.1  ALBUMIN 3.2*   CBC:  Recent Labs Lab 03/20/13 1930 03/22/13 2235 03/23/13 0540  WBC 8.9 8.1 6.3  NEUTROABS  --   --  3.6  HGB 13.0 12.0 11.8*  HCT 38.7 37.0 36.8  MCV 95.6 96.9 98.4  PLT 276 233 235   Blood Culture    Component Value Date/Time   SDES BLOOD RIGHT FINGER 03/22/2013 2240   SPECREQUEST BOTTLES DRAWN AEROBIC ONLY 2CC RT INDEX FINGER 03/22/2013 2240   CULT  Value:        BLOOD CULTURE RECEIVED NO GROWTH TO DATE CULTURE WILL BE HELD FOR 5 DAYS BEFORE ISSUING A FINAL NEGATIVE REPORT Performed at [place] 03/22/2013 2240   REPTSTATUS PENDING 03/22/2013 2240    Cardiac Enzymes:  Recent Labs Lab 03/22/13 2234 03/22/13 2235 03/23/13 0540 03/23/13 1050  CKTOTAL  --  47  --   --   TROPONINI 0.37*  --  0.41* <  0.30   CBG:  Recent Labs Lab 03/24/13 2034 03/25/13 1205 03/25/13 1633 03/25/13 2037 03/26/13 0741  GLUCAP 209* 113* 148* 226* 143*  Medications:   . amLODipine  10 mg Oral Daily  . aspirin EC  81 mg Oral QHS  . calcium carbonate  1 tablet Oral TID WC  . cefTRIAXone (ROCEPHIN)  IV  1 g Intravenous Q24H  . enoxaparin (LOVENOX) injection  30 mg Subcutaneous Q24H  . folic acid  0.5 mg Oral Daily  . insulin aspart  0-9 Units Subcutaneous TID WC  . levothyroxine  75 mcg Oral QAC breakfast  . metoprolol  100 mg Oral BID  . multivitamin  1 tablet Oral QHS  . simvastatin  5 mg Oral q1800  . vitamin C  500 mg Oral Daily             [name]

## 2013-03-27 LAB — CULTURE, BLOOD (SINGLE)

## 2013-03-29 LAB — CULTURE, BLOOD (ROUTINE X 2)
CULTURE: NO GROWTH
Culture: NO GROWTH

## 2013-04-18 ENCOUNTER — Encounter: Payer: Self-pay | Admitting: Cardiovascular Disease

## 2013-04-18 ENCOUNTER — Ambulatory Visit (INDEPENDENT_AMBULATORY_CARE_PROVIDER_SITE_OTHER): Payer: Medicare Other | Admitting: Cardiovascular Disease

## 2013-04-18 VITALS — BP 150/60 | HR 70 | Ht 62.0 in | Wt 105.5 lb

## 2013-04-18 DIAGNOSIS — I10 Essential (primary) hypertension: Secondary | ICD-10-CM

## 2013-04-18 DIAGNOSIS — E785 Hyperlipidemia, unspecified: Secondary | ICD-10-CM

## 2013-04-18 DIAGNOSIS — Z952 Presence of prosthetic heart valve: Secondary | ICD-10-CM

## 2013-04-18 DIAGNOSIS — I2510 Atherosclerotic heart disease of native coronary artery without angina pectoris: Secondary | ICD-10-CM

## 2013-04-18 DIAGNOSIS — Z954 Presence of other heart-valve replacement: Secondary | ICD-10-CM

## 2013-04-18 DIAGNOSIS — Z951 Presence of aortocoronary bypass graft: Secondary | ICD-10-CM

## 2013-04-18 DIAGNOSIS — Z9181 History of falling: Secondary | ICD-10-CM | POA: Insufficient documentation

## 2013-04-18 DIAGNOSIS — I2789 Other specified pulmonary heart diseases: Secondary | ICD-10-CM

## 2013-04-18 NOTE — Patient Instructions (Signed)
You are doing well. No medication changes were made.  Please call us if you have new issues that need to be addressed before your next appt.  Your physician wants you to follow-up in: 6 months.  You will receive a reminder letter in the mail two months in advance. If you don't receive a letter, please call our office to schedule the follow-up appointment.   

## 2013-04-18 NOTE — Assessment & Plan Note (Signed)
Recommended that she stay on her pravastatin 

## 2013-04-18 NOTE — Assessment & Plan Note (Addendum)
Fall 3 weeks ago. Required assistance from neighbors to stand up. We'll need to be monitored closely Possibly became weak from urinary tract infection

## 2013-04-18 NOTE — Assessment & Plan Note (Signed)
Currently with no symptoms of angina. No further workup at this time. Continue current medication regimen. 

## 2013-04-18 NOTE — Progress Notes (Signed)
Patient ID: [name], female    DOB: [birth year], 80 y.o.   MRN: [id number]  HPI Comments: [name] is a very pleasant 80 year old woman with history of CAD, bypass surgery x3 vessel in 2009 with bioprosthetic aortic valve placed at that time for severe aortic valve stenosis, diet controlled diabetes, previous smoking, moderate bilateral carotid disease, end-stage renal disease on hemodialysis 3 days per week for the past 2 years who presents for routine followup. She was previous he seen by Dr. Verl Blalock.  In followup today, she reports that she had a fall 3 weeks ago. She's not wearing her hearing aids today She fell into the road and required assistance getting up. She reports being at hemodialysis on March 18 and had syncope. She was taken to the hospital and diagnosed with urinary tract infection with encephalopathy. She was given antibiotics with improvement of her symptoms. Troponin 0.37 and 0.41. This was felt to be secondary to demand ischemia. Lab work 03/23/2011 shows creatinine 1.77, BUN 20 In followup today she reports having incontinence. Typically she does not make urine and she goes to dialysis 3 days per week. She reports that now she is making urine and having accidents. She wears a pad  Remote history of MRSA and C. Difficile in December 2014  She denies any significant shortness of breath or edema. Very active at baseline, does numerous activities with her son. Echocardiogram in 2012 showing normal LV function, well-seated valve, moderate pulmonary hypertension.  EKG shows normal sinus rhythm with rate 70 beats per minute, nonspecific ST and T wave changes, consider old anterior MI   Outpatient Encounter Prescriptions as of 04/18/2013  Medication Sig  . aspirin EC 81 MG tablet Take 81 mg by mouth at bedtime.  Marland Kitchen b complex-vitamin c-folic acid (NEPHRO-VITE) 0.8 MG TABS Take 0.8 mg by mouth every Monday, Wednesday, and Friday.   . calcium carbonate (TUMS - DOSED IN MG ELEMENTAL  CALCIUM) 500 MG chewable tablet Chew 1 tablet (200 mg of elemental calcium total) by mouth 3 (three) times daily with meals.  . cefdinir (OMNICEF) 300 MG capsule Take 1 capsule (300 mg total) by mouth 2 (two) times daily.  . folic acid (FOLVITE) 0.5 MG tablet Take 0.5 tablets (0.5 mg total) by mouth daily.  Marland Kitchen levothyroxine (SYNTHROID, LEVOTHROID) 75 MCG tablet Take 75 mcg by mouth daily before breakfast.  . metoprolol (LOPRESSOR) 50 MG tablet Take 1.5 tablets (75 mg total) by mouth 2 (two) times daily.  Marland Kitchen nystatin (MYCOSTATIN) powder as needed.   . pravastatin (PRAVACHOL) 10 MG tablet Take 10 mg by mouth at bedtime.  . vitamin C (VITAMIN C) 500 MG tablet Take 1 tablet (500 mg total) by mouth daily.    Review of Systems  Constitutional: Negative.        Recent fall  HENT: Negative.   Eyes: Negative.   Respiratory: Negative.   Cardiovascular: Negative.   Gastrointestinal: Negative.   Endocrine: Negative.   Musculoskeletal: Positive for gait problem.  Skin: Negative.   Allergic/Immunologic: Negative.   Neurological: Negative.   Hematological: Negative.   Psychiatric/Behavioral: Negative.   All other systems reviewed and are negative.   BP 150/60  Pulse 70  Ht 5\' 2"  (1.575 m)  Wt 105 lb 8 oz (47.854 kg)  BMI 19.29 kg/m2  Physical Exam  Nursing note and vitals reviewed. Constitutional: She is oriented to person, place, and time. She appears well-developed and well-nourished.  HENT:  Head: Normocephalic.  Nose: Nose normal.  Mouth/Throat: Oropharynx is clear and moist.  Eyes: Conjunctivae are normal. Pupils are equal, round, and reactive to light.  Neck: Normal range of motion. Neck supple. No JVD present.  Cardiovascular: Normal rate, regular rhythm, S1 normal, S2 normal, normal heart sounds and intact distal pulses.  Exam reveals no gallop and no friction rub.   No murmur heard. Pulmonary/Chest: Effort normal and breath sounds normal. No respiratory distress. She has no  wheezes. She has no rales. She exhibits no tenderness.  Abdominal: Soft. Bowel sounds are normal. She exhibits no distension. There is no tenderness.  Musculoskeletal: Normal range of motion. She exhibits no edema and no tenderness.  Lymphadenopathy:    She has no cervical adenopathy.  Neurological: She is alert and oriented to person, place, and time. Coordination normal.  Skin: Skin is warm and dry. No rash noted. No erythema.  Psychiatric: She has a normal mood and affect. Her behavior is normal. Judgment and thought content normal.    Assessment and Plan

## 2013-04-18 NOTE — Assessment & Plan Note (Signed)
Moderately elevated right ventricular systolic pressures on echocardiogram October 2014. Fluid management by hemodialysis as she does not make much urine

## 2013-04-18 NOTE — Assessment & Plan Note (Signed)
We'll schedule repeat echocardiogram on her next visit

## 2013-04-25 ENCOUNTER — Ambulatory Visit (INDEPENDENT_AMBULATORY_CARE_PROVIDER_SITE_OTHER): Payer: Medicare Other | Admitting: Internal Medicine

## 2013-04-25 ENCOUNTER — Encounter: Payer: Self-pay | Admitting: Internal Medicine

## 2013-04-25 VITALS — BP 130/60 | HR 83 | Temp 98.4°F | Wt 103.0 lb

## 2013-04-25 DIAGNOSIS — N186 End stage renal disease: Secondary | ICD-10-CM

## 2013-04-25 DIAGNOSIS — N189 Chronic kidney disease, unspecified: Secondary | ICD-10-CM

## 2013-04-25 DIAGNOSIS — E1129 Type 2 diabetes mellitus with other diabetic kidney complication: Secondary | ICD-10-CM

## 2013-04-25 DIAGNOSIS — E785 Hyperlipidemia, unspecified: Secondary | ICD-10-CM

## 2013-04-25 DIAGNOSIS — I2789 Other specified pulmonary heart diseases: Secondary | ICD-10-CM

## 2013-04-25 DIAGNOSIS — I2510 Atherosclerotic heart disease of native coronary artery without angina pectoris: Secondary | ICD-10-CM

## 2013-04-25 DIAGNOSIS — I10 Essential (primary) hypertension: Secondary | ICD-10-CM

## 2013-04-25 DIAGNOSIS — N039 Chronic nephritic syndrome with unspecified morphologic changes: Secondary | ICD-10-CM

## 2013-04-25 DIAGNOSIS — D631 Anemia in chronic kidney disease: Secondary | ICD-10-CM

## 2013-04-25 LAB — CBC WITH DIFFERENTIAL/PLATELET
BASOS PCT: 0.3 % (ref 0.0–3.0)
Basophils Absolute: 0 10*3/uL (ref 0.0–0.1)
EOS PCT: 2.8 % (ref 0.0–5.0)
Eosinophils Absolute: 0.2 10*3/uL (ref 0.0–0.7)
HCT: 36.4 % (ref 36.0–46.0)
HEMOGLOBIN: 12.1 g/dL (ref 12.0–15.0)
LYMPHS PCT: 11.1 % — AB (ref 12.0–46.0)
Lymphs Abs: 0.9 10*3/uL (ref 0.7–4.0)
MCHC: 33.1 g/dL (ref 30.0–36.0)
MCV: 99.3 fl (ref 78.0–100.0)
MONOS PCT: 12.1 % — AB (ref 3.0–12.0)
Monocytes Absolute: 1 10*3/uL (ref 0.1–1.0)
NEUTROS PCT: 73.7 % (ref 43.0–77.0)
Neutro Abs: 5.9 10*3/uL (ref 1.4–7.7)
Platelets: 280 10*3/uL (ref 150.0–400.0)
RBC: 3.67 Mil/uL — ABNORMAL LOW (ref 3.87–5.11)
RDW: 18.6 % — ABNORMAL HIGH (ref 11.5–14.6)
WBC: 7.9 10*3/uL (ref 4.5–10.5)

## 2013-04-25 LAB — TSH: TSH: 1.36 u[IU]/mL (ref 0.35–5.50)

## 2013-04-25 LAB — HEPATIC FUNCTION PANEL
ALBUMIN: 3.7 g/dL (ref 3.5–5.2)
ALT: 32 U/L (ref 0–35)
AST: 36 U/L (ref 0–37)
Alkaline Phosphatase: 199 U/L — ABNORMAL HIGH (ref 39–117)
BILIRUBIN DIRECT: 0.1 mg/dL (ref 0.0–0.3)
TOTAL PROTEIN: 8.6 g/dL — AB (ref 6.0–8.3)
Total Bilirubin: 0.7 mg/dL (ref 0.3–1.2)

## 2013-04-25 LAB — LIPID PANEL
CHOLESTEROL: 158 mg/dL (ref 0–200)
HDL: 64.5 mg/dL (ref >39.00)
LDL Cholesterol: 76 mg/dL (ref 0–99)
TRIGLYCERIDES: 86 mg/dL (ref 0.0–149.0)
Total CHOL/HDL Ratio: 2
VLDL: 17.2 mg/dL (ref 0.0–40.0)

## 2013-04-25 LAB — HEMOGLOBIN A1C: Hgb A1c MFr Bld: 5.9 % (ref 4.6–6.5)

## 2013-04-25 LAB — T4, FREE: Free T4: 0.91 ng/dL (ref 0.60–1.60)

## 2013-04-25 NOTE — Assessment & Plan Note (Signed)
No edema or sig dyspnea

## 2013-04-25 NOTE — Assessment & Plan Note (Signed)
Probably still good control Due for labs

## 2013-04-25 NOTE — Assessment & Plan Note (Signed)
Seems to be doing okay on the dialysis

## 2013-04-25 NOTE — Assessment & Plan Note (Signed)
Evidence is that statins are probably not effective when someone is already on dialysis Will continue based on cardiology eval

## 2013-04-25 NOTE — Progress Notes (Signed)
Pre visit review using our clinic review tool, if applicable. No additional management support is needed unless otherwise documented below in the visit note. 

## 2013-04-25 NOTE — Progress Notes (Signed)
Subjective:    Patient ID: [name], female    DOB: [birth year], 80 y.o.   MRN: [id number]  HPI Doing okay Stays at home Has aide 2 days per week Still drives--but has Lucianne Lei to take her to dialysis Son still comes weekly---they go out, shop, etc  Still voids regularly No dysuria or blood No fever  Breathing is fine No chest pain  Doesn't check sugars Gets it checked at dialysis Not on meds now  Current Outpatient Prescriptions on File Prior to Visit  Medication Sig Dispense Refill  . aspirin EC 81 MG tablet Take 81 mg by mouth at bedtime.      Marland Kitchen b complex-vitamin c-folic acid (NEPHRO-VITE) 0.8 MG TABS Take 0.8 mg by mouth every Monday, Wednesday, and Friday.       . calcium carbonate (TUMS - DOSED IN MG ELEMENTAL CALCIUM) 500 MG chewable tablet Chew 1 tablet (200 mg of elemental calcium total) by mouth 3 (three) times daily with meals.      . folic acid (FOLVITE) 0.5 MG tablet Take 0.5 tablets (0.5 mg total) by mouth daily.      Marland Kitchen levothyroxine (SYNTHROID, LEVOTHROID) 75 MCG tablet Take 75 mcg by mouth daily before breakfast.      . metoprolol (LOPRESSOR) 50 MG tablet Take 1.5 tablets (75 mg total) by mouth 2 (two) times daily.  60 tablet  0  . nystatin (MYCOSTATIN) powder as needed.       . pravastatin (PRAVACHOL) 10 MG tablet Take 10 mg by mouth at bedtime.      . vitamin C (VITAMIN C) 500 MG tablet Take 1 tablet (500 mg total) by mouth daily.       No current facility-administered medications on file prior to visit.    Allergies  Allergen Reactions  . Nitrofurantoin Itching  . Captopril Other (See Comments)    REACTION: unspecified  . Enalapril Maleate Cough  . Ramipril Other (See Comments)    REACTION: unspecified  . Sulfa Antibiotics Other (See Comments)    Reaction unknown  . Verapamil Other (See Comments)    REACTION: unspecified    Past Medical History  Diagnosis Date  . Anemia     NOS  . Personal history of colonic polyps   . GERD (gastroesophageal  reflux disease)   . Hyperlipidemia     takes Simvastatin nightly  . Osteoarthritis   . Osteopenia   . Urinary incontinence   . Pulmonary hypertension   . Anxiety   . Renal insufficiency   . Coronary artery disease   . Aortic stenosis   . Hyperlipidemia   . Hypertension     takes Metoprolol daily  . Peripheral vascular disease   . CHF (congestive heart failure)   . Migraine     hx of  . Constipation   . Diarrhea   . Oligouria   . History of blood transfusion   . Diabetes mellitus     type II;was on Glipizide but has been off x 47mon  . Hypothyroidism     takes Synthroid daily  . Cancer     Mole on top of head to be removed in July 2013  . UTI (urinary tract infection)     Past Surgical History  Procedure Laterality Date  . Abdominal hysterectomy    . Tonsillectomy    . Cardiac catheterization  2000    cad  . Replacement total knee bilateral  05/1998  . Cataract extraction    .  Coronary artery bypass graft      od  . Adenosine myoview  2007    benign, EF 69%  . Carotid endarterectomy  2011    Right  . Coronary artery bypass graft  2009  . Aortic valve replacement  2009  . Amputation-left great toe  7/12    Dr Marlou Sa  . Traumatic amputation of right dip joint of index finger    . Insertion of dialysis catheter  12/18/2010    Procedure: INSERTION OF DIALYSIS CATHETER;  Surgeon: Mal Misty, MD;  Location: [facility];  Service: Vascular;  Laterality: Right;  . Av fistula placement  12/22/2010    Procedure: ARTERIOVENOUS (AV) FISTULA CREATION;  Surgeon: Hinda Lenis, MD;  Location: [facility];  Service: Vascular;  Laterality: Left;  Creation of Left Brachial-Cephalic Fistula  . Appendectomy    . Eye surgery      BIlateral  . Amputation  06/18/2011    Procedure: AMPUTATION DIGIT;  Surgeon: Newt Minion, MD;  Location: [facility];  Service: Orthopedics;  Laterality: Left;  Left 2nd Toe Amputation at MTP Joint    No family history on file.  History   Social History  .  Marital Status: Widowed    Spouse Name: [name]    Number of Children: 1  . Years of Education: [name]   Occupational History  . 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 Smoking status: Former Smoker -- 15 years    Types: Cigarettes    Quit date: 01/06/1943  . Smokeless tobacco: Never Used  . Alcohol Use: Yes     Comment: Wine occasionally  . Drug Use: No  . Sexual Activity: No   Other Topics Concern  . Not on file   Social History Narrative   Son Evlyn Clines to make decisions for her   Not sure about DNR---would defer to him (though inclined to not want resuscitation attempts)   Review of Systems Recent fall on 62-- going to get mail. Some back pain after that--has improved since then. 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 No distress.  Neck: Normal range of motion. Neck supple.  Cardiovascular: Normal rate, regular rhythm and normal heart sounds.  Exam reveals no gallop.   No murmur heard. Pulmonary/Chest: Effort normal and breath sounds normal. No respiratory distress. She has no wheezes. She has no rales.  Musculoskeletal:  Dilation at left arm shunt. Good bruit No edema  Lymphadenopathy:    She has no cervical adenopathy.  Psychiatric: She has a normal mood and affect. Her behavior is normal.          Assessment & Plan:

## 2013-04-25 NOTE — Assessment & Plan Note (Signed)
No symptoms 

## 2013-04-25 NOTE — Assessment & Plan Note (Signed)
BP Readings from Last 3 Encounters:  04/25/13 130/60  04/18/13 150/60  03/26/13 160/77   Good control now No changes needed

## 2013-04-25 NOTE — Patient Instructions (Signed)
Please bring all your medicines with you next time.

## 2013-04-26 ENCOUNTER — Encounter: Payer: Self-pay | Admitting: *Deleted

## 2013-05-05 LAB — HM DIABETES EYE EXAM

## 2013-05-25 ENCOUNTER — Other Ambulatory Visit: Payer: Self-pay | Admitting: Internal Medicine

## 2013-06-14 ENCOUNTER — Other Ambulatory Visit: Payer: Self-pay | Admitting: *Deleted

## 2013-06-14 MED ORDER — METOPROLOL TARTRATE 50 MG PO TABS: 75.0000 mg | ORAL_TABLET | Freq: Two times a day (BID) | ORAL | Status: DC

## 2013-06-14 NOTE — Telephone Encounter (Signed)
Requested Prescriptions   Signed Prescriptions Disp Refills  . metoprolol (LOPRESSOR) 50 MG tablet 60 tablet 3    Sig: Take 1.5 tablets (75 mg total) by mouth 2 (two) times daily.    Authorizing Provider: Minna Merritts    Ordering User: Britt Bottom

## 2013-07-01 ENCOUNTER — Other Ambulatory Visit: Payer: Self-pay | Admitting: Internal Medicine

## 2013-08-05 ENCOUNTER — Other Ambulatory Visit: Payer: Self-pay | Admitting: Internal Medicine

## 2013-08-14 ENCOUNTER — Emergency Department: Payer: Self-pay | Admitting: Emergency Medicine

## 2013-08-29 ENCOUNTER — Encounter: Payer: Self-pay | Admitting: Internal Medicine

## 2013-08-29 ENCOUNTER — Ambulatory Visit (INDEPENDENT_AMBULATORY_CARE_PROVIDER_SITE_OTHER): Payer: Medicare Other | Admitting: Internal Medicine

## 2013-08-29 VITALS — BP 138/60 | HR 64 | Temp 97.9°F | Wt 109.0 lb

## 2013-08-29 DIAGNOSIS — S98139A Complete traumatic amputation of one unspecified lesser toe, initial encounter: Secondary | ICD-10-CM

## 2013-08-29 DIAGNOSIS — IMO0002 Reserved for concepts with insufficient information to code with codable children: Secondary | ICD-10-CM | POA: Insufficient documentation

## 2013-08-29 DIAGNOSIS — N186 End stage renal disease: Secondary | ICD-10-CM

## 2013-08-29 DIAGNOSIS — I771 Stricture of artery: Secondary | ICD-10-CM

## 2013-08-29 DIAGNOSIS — E1129 Type 2 diabetes mellitus with other diabetic kidney complication: Secondary | ICD-10-CM

## 2013-08-29 DIAGNOSIS — E785 Hyperlipidemia, unspecified: Secondary | ICD-10-CM

## 2013-08-29 DIAGNOSIS — I10 Essential (primary) hypertension: Secondary | ICD-10-CM

## 2013-08-29 LAB — HM DIABETES FOOT EXAM

## 2013-08-29 NOTE — Assessment & Plan Note (Signed)
No symptoms now.

## 2013-08-29 NOTE — Patient Instructions (Signed)
Please decrease the metoprolol to 50mg  twice a day

## 2013-08-29 NOTE — Assessment & Plan Note (Signed)
Doing well with dialysis Is having hypotension frequently Will decrease the metoprolol

## 2013-08-29 NOTE — Assessment & Plan Note (Signed)
BP Readings from Last 3 Encounters:  08/29/13 138/60  04/25/13 130/60  04/18/13 150/60   Will cut the metoprolol to 50 bid

## 2013-08-29 NOTE — Assessment & Plan Note (Signed)
Lab Results  Component Value Date   HGBA1C 5.9 04/25/2013   Still seems excellent control without meds Will recheck next appt

## 2013-08-29 NOTE — Assessment & Plan Note (Signed)
Left great toe Site is clean

## 2013-08-29 NOTE — Progress Notes (Signed)
Subjective:    Patient ID: [name], female    DOB: 02-Oct-1920, 80 y.o.   MRN: [id number]  HPI Here with son No aides anymore Son takes her out for shopping, etc Doesn't cook much Lucianne Lei still for dialysis---takes herself to eye track  Generally doing okay Does have BP drop at dialysis But generally tolerating it Some trouble with bleeding from the graft---had it revised and now working better  Still on heart track program They check BP regularly As low as 100/58, 116/40, 88/46 Highest 146/48 No dizziness or sycnope  Checks sugars at dialysis May be as high as 200--but generally lower. Depends on what she eats and these are not fasting Still no meds for this  No chest pain No palpitations No edema Breathing is good  Current Outpatient Prescriptions on File Prior to Visit  Medication Sig Dispense Refill  . aspirin EC 81 MG tablet Take 81 mg by mouth at bedtime.      . calcium carbonate (TUMS - DOSED IN MG ELEMENTAL CALCIUM) 500 MG chewable tablet Chew 1 tablet (200 mg of elemental calcium total) by mouth 3 (three) times daily with meals.      . folic acid (FOLVITE) 0.5 MG tablet Take 0.5 tablets (0.5 mg total) by mouth daily.      Marland Kitchen levothyroxine (SYNTHROID, LEVOTHROID) 75 MCG tablet TAKE ONE TABLET BY MOUTH ONCE DAILY  30 tablet  0  . metoprolol (LOPRESSOR) 50 MG tablet Take 1.5 tablets (75 mg total) by mouth 2 (two) times daily.  60 tablet  3  . vitamin C (VITAMIN C) 500 MG tablet Take 1 tablet (500 mg total) by mouth daily.       No current facility-administered medications on file prior to visit.    Allergies  Allergen Reactions  . Nitrofurantoin Itching  . Captopril Other (See Comments)    REACTION: unspecified  . Enalapril Maleate Cough  . Ramipril Other (See Comments)    REACTION: unspecified  . Sulfa Antibiotics Other (See Comments)    Reaction unknown  . Verapamil Other (See Comments)    REACTION: unspecified    Past Medical History  Diagnosis Date   . Anemia     NOS  . Personal history of colonic polyps   . GERD (gastroesophageal reflux disease)   . Hyperlipidemia     takes Simvastatin nightly  . Osteoarthritis   . Osteopenia   . Urinary incontinence   . Pulmonary hypertension   . Anxiety   . Renal insufficiency   . Coronary artery disease   . Aortic stenosis   . Hyperlipidemia   . Hypertension     takes Metoprolol daily  . Peripheral vascular disease   . CHF (congestive heart failure)   . Migraine     hx of  . Constipation   . Diarrhea   . Oligouria   . History of blood transfusion   . Diabetes mellitus     type II;was on Glipizide but has been off x 37mon  . Hypothyroidism     takes Synthroid daily  . Cancer     Mole on top of head to be removed in July 2013  . UTI (urinary tract infection)     Past Surgical History  Procedure Laterality Date  . Abdominal hysterectomy    . Tonsillectomy    . Cardiac catheterization  2000    cad  . Replacement total knee bilateral  05/1998  . Cataract extraction    . Coronary  artery bypass graft      od  . Adenosine myoview  2007    benign, EF 69%  . Carotid endarterectomy  2011    Right  . Coronary artery bypass graft  2009  . Aortic valve replacement  2009  . Amputation-left great toe  7/12    Dr Marlou Sa  . Traumatic amputation of right dip joint of index finger    . Insertion of dialysis catheter  12/18/2010    Procedure: INSERTION OF DIALYSIS CATHETER;  Surgeon: Mal Misty, MD;  Location: [facility];  Service: Vascular;  Laterality: Right;  . Av fistula placement  12/22/2010    Procedure: ARTERIOVENOUS (AV) FISTULA CREATION;  Surgeon: Hinda Lenis, MD;  Location: [facility];  Service: Vascular;  Laterality: Left;  Creation of Left Brachial-Cephalic Fistula  . Appendectomy    . Eye surgery      BIlateral  . Amputation  06/18/2011    Procedure: AMPUTATION DIGIT;  Surgeon: Newt Minion, MD;  Location: [facility];  Service: Orthopedics;  Laterality: Left;  Left 2nd Toe  Amputation at MTP Joint    No family history on file.  History   Social History  . Marital Status: Married    Spouse Name: [name]    Number of Children: 1  . Years of Education: [name]   Occupational History  . 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 Smoking status: Former Smoker -- 15 years    Types: Cigarettes    Quit date: 01/06/1943  . Smokeless tobacco: Never Used  . Alcohol Use: Yes     Comment: Wine occasionally  . Drug Use: No  . Sexual Activity: No   Other Topics Concern  . Not on file   Social History Narrative   Son Evlyn Clines to make decisions for her   Not sure about DNR---would defer to him (though inclined to not want resuscitation attempts)   Review of Systems Appetite is still not great--but has gained 6# Has some low energy days--- doesn't want to go to [facility] for example. Son encourages her. Does fine other times Sleeps is variable    Objective:   Physical Exam  Constitutional: She appears well-developed. No distress.  Neck: Normal range of motion. Neck supple. No thyromegaly present.  Cardiovascular: Normal rate and regular rhythm.  Exam reveals no gallop.   Soft systolic murmur Faint distal pulses  Pulmonary/Chest: Effort normal and breath sounds normal. No respiratory distress. She has no wheezes. She has no rales.  Musculoskeletal: She exhibits no edema and no tenderness.  Left great toe amputation site clean  Lymphadenopathy:    She has no cervical adenopathy.  Psychiatric: She has a normal mood and affect. Her behavior is normal.          Assessment & Plan:

## 2013-08-29 NOTE — Assessment & Plan Note (Signed)
Evidence suggests the statin is not beneficial once dialysis has started Will continue for now

## 2013-08-29 NOTE — Progress Notes (Signed)
Pre visit review using our clinic review tool, if applicable. No additional management support is needed unless otherwise documented below in the visit note. 

## 2013-09-01 ENCOUNTER — Telehealth: Payer: Self-pay

## 2013-09-01 NOTE — Telephone Encounter (Signed)
[name] with [facility] request cb about labs. Left message for [name] to cb.

## 2013-09-07 NOTE — Telephone Encounter (Signed)
Spoke with [name] and some one has already sent her what she needs. Nothing further needed.

## 2013-09-09 ENCOUNTER — Other Ambulatory Visit: Payer: Self-pay | Admitting: Internal Medicine

## 2013-09-12 LAB — HM DIABETES EYE EXAM

## 2013-09-21 ENCOUNTER — Encounter: Payer: Self-pay | Admitting: Internal Medicine

## 2013-10-10 ENCOUNTER — Ambulatory Visit (INDEPENDENT_AMBULATORY_CARE_PROVIDER_SITE_OTHER): Payer: Medicare Other

## 2013-10-10 DIAGNOSIS — Z23 Encounter for immunization: Secondary | ICD-10-CM

## 2013-10-19 ENCOUNTER — Other Ambulatory Visit: Payer: Self-pay | Admitting: Internal Medicine

## 2013-10-30 ENCOUNTER — Other Ambulatory Visit: Payer: Self-pay | Admitting: Cardiovascular Disease

## 2013-10-31 ENCOUNTER — Encounter: Payer: Self-pay | Admitting: Cardiovascular Disease

## 2013-10-31 ENCOUNTER — Ambulatory Visit (INDEPENDENT_AMBULATORY_CARE_PROVIDER_SITE_OTHER): Payer: Medicare Other | Admitting: Cardiovascular Disease

## 2013-10-31 VITALS — BP 162/70 | HR 73 | Ht 62.0 in | Wt 113.5 lb

## 2013-10-31 DIAGNOSIS — Z953 Presence of xenogenic heart valve: Secondary | ICD-10-CM

## 2013-10-31 DIAGNOSIS — E785 Hyperlipidemia, unspecified: Secondary | ICD-10-CM

## 2013-10-31 DIAGNOSIS — N058 Unspecified nephritic syndrome with other morphologic changes: Secondary | ICD-10-CM

## 2013-10-31 DIAGNOSIS — Z951 Presence of aortocoronary bypass graft: Secondary | ICD-10-CM

## 2013-10-31 DIAGNOSIS — E1129 Type 2 diabetes mellitus with other diabetic kidney complication: Secondary | ICD-10-CM

## 2013-10-31 DIAGNOSIS — N186 End stage renal disease: Secondary | ICD-10-CM

## 2013-10-31 DIAGNOSIS — Z954 Presence of other heart-valve replacement: Secondary | ICD-10-CM

## 2013-10-31 DIAGNOSIS — I25709 Atherosclerosis of coronary artery bypass graft(s), unspecified, with unspecified angina pectoris: Secondary | ICD-10-CM

## 2013-10-31 NOTE — Assessment & Plan Note (Signed)
Tolerating hemodialysis 3 days per week. Some labile blood pressures after dialysis

## 2013-10-31 NOTE — Patient Instructions (Addendum)
Your next appointment will be scheduled in our new office located at :  [place]  [address], [place], [place] [address]  You are doing well. No medication changes were made.  Blood pressure is elevated today Please call if it runs high, >150 on a regular basis  Please call us if you have new issues that need to be addressed before your next appt.  Your physician wants you to follow-up in: 6 months.  You will receive a reminder letter in the mail two months in advance. If you don't receive a letter, please call our office to schedule the follow-up appointment.

## 2013-10-31 NOTE — Assessment & Plan Note (Signed)
Currently with no symptoms of angina. No further workup at this time. Continue current medication regimen. 

## 2013-10-31 NOTE — Assessment & Plan Note (Signed)
Well seated valve last seen in late 2014. Repeat echocardiogram possibly next year 2016

## 2013-10-31 NOTE — Progress Notes (Signed)
Patient ID: [name], female    DOB: 05-31-1920, 80 y.o.   MRN: [id number]  HPI Comments: [name] is a very pleasant 80 year old woman with history of CAD, bypass surgery x3 vessel in 2009 with bioprosthetic aortic valve placed at that time for severe aortic valve stenosis, diet controlled diabetes, previous smoking, moderate bilateral carotid disease, end-stage renal disease on hemodialysis 3 days per week for the past 2 years who presents for routine followup.   In followup today, she reports that she is doing well. No recent falls. She is exercising with heart track, has good balance. Denies any chest pain or shortness of breath. Blood pressures labile depending on dialysis regimen. Runs low with dialysis. Not been checking on nondialysis days. History of incontinence  Total cholesterol 158, hemoglobin A1c 5.9   She reports being at hemodialysis on March 18 and had syncope. She was taken to the hospital and diagnosed with urinary tract infection with encephalopathy. She was given antibiotics with improvement of her symptoms. Troponin 0.37 and 0.41. This was felt to be secondary to demand ischemia.  Remote history of MRSA and C. Difficile in December 2014 Echocardiogram in late 2014, well-seated valve  EKG shows normal sinus rhythm with rate 76 beats per minute, nonspecific ST and T wave changes, consider old anterior M, left axis deviationI   Outpatient Encounter Prescriptions as of 10/31/2013  Medication Sig  . aspirin EC 81 MG tablet Take 81 mg by mouth at bedtime.  . calcium carbonate (TUMS - DOSED IN MG ELEMENTAL CALCIUM) 500 MG chewable tablet Chew 1 tablet (200 mg of elemental calcium total) by mouth 3 (three) times daily with meals.  . folic acid (FOLVITE) 0.5 MG tablet Take 0.5 tablets (0.5 mg total) by mouth daily.  Marland Kitchen levothyroxine (SYNTHROID, LEVOTHROID) 75 MCG tablet TAKE ONE TABLET BY MOUTH ONCE DAILY  . metoprolol (LOPRESSOR) 50 MG tablet Take 50 mg by mouth 2 (two)  times daily.  . metoprolol (LOPRESSOR) 50 MG tablet TAKE ONE & ONE-HALF TABLETS BY MOUTH TWICE DAILY  . pravastatin (PRAVACHOL) 20 MG tablet Take 20 mg by mouth daily.  . vitamin C (VITAMIN C) 500 MG tablet Take 1 tablet (500 mg total) by mouth daily.    Review of Systems  Constitutional: Negative.   HENT: Negative.   Eyes: Negative.   Respiratory: Negative.   Cardiovascular: Negative.   Gastrointestinal: Negative.   Endocrine: Negative.   Musculoskeletal: Positive for gait problem.  Skin: Negative.   Allergic/Immunologic: Negative.   Neurological: Negative.   Hematological: Negative.   Psychiatric/Behavioral: Negative.   All other systems reviewed and are negative.   BP 162/70  Pulse 73  Ht 5\' 2"  (1.575 m)  Wt 113 lb 8 oz (51.483 kg)  BMI 20.75 kg/m2 Repeat blood pressure 325 systolic Physical Exam  Nursing note and vitals reviewed. Constitutional: She is oriented to person, place, and time. She appears well-developed and well-nourished.  HENT:  Head: Normocephalic.  Nose: Nose normal.  Mouth/Throat: Oropharynx is clear and moist.  Eyes: Conjunctivae are normal. Pupils are equal, round, and reactive to light.  Neck: Normal range of motion. Neck supple. No JVD present.  Cardiovascular: Normal rate, regular rhythm, S1 normal, S2 normal, normal heart sounds and intact distal pulses.  Exam reveals no gallop and no friction rub.   No murmur heard. Pulmonary/Chest: Effort normal and breath sounds normal. No respiratory distress. She has no wheezes. She has no rales. She exhibits no tenderness.  Abdominal: Soft. Bowel  sounds are normal. She exhibits no distension. There is no tenderness.  Musculoskeletal: Normal range of motion. She exhibits no edema and no tenderness.  Lymphadenopathy:    She has no cervical adenopathy.  Neurological: She is alert and oriented to person, place, and time. Coordination normal.  Skin: Skin is warm and dry. No rash noted. No erythema.   Psychiatric: She has a normal mood and affect. Her behavior is normal. Judgment and thought content normal.    Assessment and Plan

## 2013-10-31 NOTE — Assessment & Plan Note (Signed)
Cholesterol is at goal on the current lipid regimen. No changes to the medications were made.  

## 2013-10-31 NOTE — Assessment & Plan Note (Signed)
We have encouraged continued exercise, careful diet management in an effort to lose weight. 

## 2013-12-16 ENCOUNTER — Other Ambulatory Visit: Payer: Self-pay | Admitting: Cardiovascular Disease

## 2013-12-21 ENCOUNTER — Ambulatory Visit (INDEPENDENT_AMBULATORY_CARE_PROVIDER_SITE_OTHER): Payer: Medicare Other | Admitting: Internal Medicine

## 2013-12-21 ENCOUNTER — Encounter: Payer: Self-pay | Admitting: Internal Medicine

## 2013-12-21 VITALS — BP 150/60 | HR 66 | Temp 97.7°F | Wt 112.0 lb

## 2013-12-21 DIAGNOSIS — N058 Unspecified nephritic syndrome with other morphologic changes: Secondary | ICD-10-CM

## 2013-12-21 DIAGNOSIS — E1151 Type 2 diabetes mellitus with diabetic peripheral angiopathy without gangrene: Secondary | ICD-10-CM

## 2013-12-21 DIAGNOSIS — N185 Chronic kidney disease, stage 5: Secondary | ICD-10-CM

## 2013-12-21 DIAGNOSIS — I739 Peripheral vascular disease, unspecified: Secondary | ICD-10-CM | POA: Insufficient documentation

## 2013-12-21 DIAGNOSIS — E1159 Type 2 diabetes mellitus with other circulatory complications: Secondary | ICD-10-CM

## 2013-12-21 DIAGNOSIS — D631 Anemia in chronic kidney disease: Secondary | ICD-10-CM

## 2013-12-21 DIAGNOSIS — I272 Pulmonary hypertension, unspecified: Secondary | ICD-10-CM

## 2013-12-21 DIAGNOSIS — E1129 Type 2 diabetes mellitus with other diabetic kidney complication: Secondary | ICD-10-CM

## 2013-12-21 DIAGNOSIS — I270 Primary pulmonary hypertension: Secondary | ICD-10-CM

## 2013-12-21 DIAGNOSIS — I771 Stricture of artery: Secondary | ICD-10-CM

## 2013-12-21 DIAGNOSIS — Z992 Dependence on renal dialysis: Secondary | ICD-10-CM

## 2013-12-21 DIAGNOSIS — N186 End stage renal disease: Secondary | ICD-10-CM

## 2013-12-21 NOTE — Assessment & Plan Note (Signed)
No meds still Probably still fine Will check

## 2013-12-21 NOTE — Assessment & Plan Note (Signed)
Check labs next time

## 2013-12-21 NOTE — Assessment & Plan Note (Signed)
Now with fixed split S2 No dyspnea though

## 2013-12-21 NOTE — Assessment & Plan Note (Signed)
Doing okay Recent work on fistula--now doing better

## 2013-12-21 NOTE — Progress Notes (Signed)
Pre visit review using our clinic review tool, if applicable. No additional management support is needed unless otherwise documented below in the visit note. 

## 2013-12-21 NOTE — Assessment & Plan Note (Signed)
No skin breakdown now Follows with Dr Sharol Given

## 2013-12-21 NOTE — Progress Notes (Signed)
Subjective:    Patient ID: [name], female    DOB: 12-22-1920, 80 y.o.   MRN: [id number]  HPI Here for follow up of diabetes and other chronic medical problems  Needed revision of fistula last week Had been bleeding Working better now as of this week Dialysis has been okay  4 days ago didn't feel right Went to church then home tired Energy levels seem back to normal since then No chest pain No SOB  Gets sugars checked at dialysis Has been okay by her report No meds for this No sores in feet  Current Outpatient Prescriptions on File Prior to Visit  Medication Sig Dispense Refill  . aspirin EC 81 MG tablet Take 81 mg by mouth at bedtime.    . calcium carbonate (TUMS - DOSED IN MG ELEMENTAL CALCIUM) 500 MG chewable tablet Chew 1 tablet (200 mg of elemental calcium total) by mouth 3 (three) times daily with meals.    . folic acid (FOLVITE) 0.5 MG tablet Take 0.5 tablets (0.5 mg total) by mouth daily.    Marland Kitchen levothyroxine (SYNTHROID, LEVOTHROID) 75 MCG tablet TAKE ONE TABLET BY MOUTH ONCE DAILY 30 tablet 4  . metoprolol (LOPRESSOR) 50 MG tablet Take 50 mg by mouth 2 (two) times daily.    . pravastatin (PRAVACHOL) 20 MG tablet TAKE ONE TABLET BY MOUTH IN THE EVENING 90 tablet 3  . vitamin C (VITAMIN C) 500 MG tablet Take 1 tablet (500 mg total) by mouth daily.     No current facility-administered medications on file prior to visit.    Allergies  Allergen Reactions  . Nitrofurantoin Itching  . Captopril Other (See Comments)    REACTION: unspecified  . Enalapril Maleate Cough  . Ramipril Other (See Comments)    REACTION: unspecified  . Sulfa Antibiotics Other (See Comments)    Reaction unknown  . Verapamil Other (See Comments)    REACTION: unspecified    Past Medical History  Diagnosis Date  . Anemia     NOS  . Personal history of colonic polyps   . GERD (gastroesophageal reflux disease)   . Hyperlipidemia     takes Simvastatin nightly  . Osteoarthritis   .  Osteopenia   . Urinary incontinence   . Pulmonary hypertension   . Anxiety   . Renal insufficiency   . Coronary artery disease   . Aortic stenosis   . Hyperlipidemia   . Hypertension     takes Metoprolol daily  . Peripheral vascular disease   . CHF (congestive heart failure)   . Migraine     hx of  . Constipation   . Diarrhea   . Oligouria   . History of blood transfusion   . Diabetes mellitus     type II;was on Glipizide but has been off x 89mon  . Hypothyroidism     takes Synthroid daily  . Cancer     Mole on top of head to be removed in July 2013  . UTI (urinary tract infection)     Past Surgical History  Procedure Laterality Date  . Abdominal hysterectomy    . Tonsillectomy    . Cardiac catheterization  2000    cad  . Replacement total knee bilateral  05/1998  . Cataract extraction    . Coronary artery bypass graft      od  . Adenosine myoview  2007    benign, EF 69%  . Carotid endarterectomy  2011    Right  .  Coronary artery bypass graft  2009  . Aortic valve replacement  2009  . Amputation-left great toe  7/12    Dr Marlou Sa  . Traumatic amputation of right dip joint of index finger    . Insertion of dialysis catheter  12/18/2010    Procedure: INSERTION OF DIALYSIS CATHETER;  Surgeon: Mal Misty, MD;  Location: [facility];  Service: Vascular;  Laterality: Right;  . Av fistula placement  12/22/2010    Procedure: ARTERIOVENOUS (AV) FISTULA CREATION;  Surgeon: Hinda Lenis, MD;  Location: [facility];  Service: Vascular;  Laterality: Left;  Creation of Left Brachial-Cephalic Fistula  . Appendectomy    . Eye surgery      BIlateral  . Amputation  06/18/2011    Procedure: AMPUTATION DIGIT;  Surgeon: Newt Minion, MD;  Location: [facility];  Service: Orthopedics;  Laterality: Left;  Left 2nd Toe Amputation at MTP Joint    Family History  Problem Relation Age of Onset  . Family history unknown: Yes    History   Social History  . Marital Status: Married    Spouse  Name: [name]    Number of Children: 1  . Years of Education: [name]   Occupational History  . 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 Smoking status: Former Smoker -- 15 years    Types: Cigarettes    Quit date: 01/06/1943  . Smokeless tobacco: Never Used  . Alcohol Use: Yes     Comment: Wine occasionally  . Drug Use: No  . Sexual Activity: No   Other Topics Concern  . Not on file   Social History Narrative   Son Evlyn Clines to make decisions for her   Not sure about DNR---would defer to him (though inclined to not want resuscitation attempts)   Review of Systems No dysuria or hematuria Some right shoulder pain--"weird" Appetite is variable Weight is fairly stable    Objective:   Physical Exam  Constitutional: She appears well-developed. No distress.  Neck: Normal range of motion. Neck supple.  Cardiovascular: Normal rate.  Exam reveals no gallop.   Gr 3/6 aortic systolic murmur Fixed split S2 ??very faint pedal pulses  Pulmonary/Chest: Effort normal and breath sounds normal. No respiratory distress. She has no wheezes. She has no rales.  Musculoskeletal: She exhibits no edema or tenderness.  Lymphadenopathy:    She has no cervical adenopathy.  Psychiatric: She has a normal mood and affect. Her behavior is normal.          Assessment & Plan:

## 2013-12-21 NOTE — Assessment & Plan Note (Signed)
Will check A1c

## 2013-12-22 LAB — HEMOGLOBIN A1C: Hgb A1c MFr Bld: 5.5 % (ref 4.6–6.5)

## 2013-12-25 ENCOUNTER — Encounter: Payer: Self-pay | Admitting: *Deleted

## 2014-01-06 DIAGNOSIS — J13 Pneumonia due to Streptococcus pneumoniae: Secondary | ICD-10-CM | POA: Diagnosis not present

## 2014-01-06 DIAGNOSIS — E119 Type 2 diabetes mellitus without complications: Secondary | ICD-10-CM | POA: Diagnosis not present

## 2014-01-06 DIAGNOSIS — N186 End stage renal disease: Secondary | ICD-10-CM | POA: Diagnosis not present

## 2014-01-06 DIAGNOSIS — D509 Iron deficiency anemia, unspecified: Secondary | ICD-10-CM | POA: Diagnosis not present

## 2014-01-06 DIAGNOSIS — D631 Anemia in chronic kidney disease: Secondary | ICD-10-CM | POA: Diagnosis not present

## 2014-01-06 DIAGNOSIS — D689 Coagulation defect, unspecified: Secondary | ICD-10-CM | POA: Diagnosis not present

## 2014-01-08 DIAGNOSIS — E119 Type 2 diabetes mellitus without complications: Secondary | ICD-10-CM | POA: Diagnosis not present

## 2014-01-08 DIAGNOSIS — D631 Anemia in chronic kidney disease: Secondary | ICD-10-CM | POA: Diagnosis not present

## 2014-01-08 DIAGNOSIS — D509 Iron deficiency anemia, unspecified: Secondary | ICD-10-CM | POA: Diagnosis not present

## 2014-01-08 DIAGNOSIS — J13 Pneumonia due to Streptococcus pneumoniae: Secondary | ICD-10-CM | POA: Diagnosis not present

## 2014-01-08 DIAGNOSIS — N186 End stage renal disease: Secondary | ICD-10-CM | POA: Diagnosis not present

## 2014-01-08 DIAGNOSIS — D689 Coagulation defect, unspecified: Secondary | ICD-10-CM | POA: Diagnosis not present

## 2014-01-10 DIAGNOSIS — N186 End stage renal disease: Secondary | ICD-10-CM | POA: Diagnosis not present

## 2014-01-10 DIAGNOSIS — D631 Anemia in chronic kidney disease: Secondary | ICD-10-CM | POA: Diagnosis not present

## 2014-01-10 DIAGNOSIS — E119 Type 2 diabetes mellitus without complications: Secondary | ICD-10-CM | POA: Diagnosis not present

## 2014-01-10 DIAGNOSIS — D509 Iron deficiency anemia, unspecified: Secondary | ICD-10-CM | POA: Diagnosis not present

## 2014-01-10 DIAGNOSIS — D689 Coagulation defect, unspecified: Secondary | ICD-10-CM | POA: Diagnosis not present

## 2014-01-10 DIAGNOSIS — J13 Pneumonia due to Streptococcus pneumoniae: Secondary | ICD-10-CM | POA: Diagnosis not present

## 2014-01-12 DIAGNOSIS — E119 Type 2 diabetes mellitus without complications: Secondary | ICD-10-CM | POA: Diagnosis not present

## 2014-01-12 DIAGNOSIS — D631 Anemia in chronic kidney disease: Secondary | ICD-10-CM | POA: Diagnosis not present

## 2014-01-12 DIAGNOSIS — N186 End stage renal disease: Secondary | ICD-10-CM | POA: Diagnosis not present

## 2014-01-12 DIAGNOSIS — D689 Coagulation defect, unspecified: Secondary | ICD-10-CM | POA: Diagnosis not present

## 2014-01-12 DIAGNOSIS — D509 Iron deficiency anemia, unspecified: Secondary | ICD-10-CM | POA: Diagnosis not present

## 2014-01-12 DIAGNOSIS — J13 Pneumonia due to Streptococcus pneumoniae: Secondary | ICD-10-CM | POA: Diagnosis not present

## 2014-01-15 DIAGNOSIS — D631 Anemia in chronic kidney disease: Secondary | ICD-10-CM | POA: Diagnosis not present

## 2014-01-15 DIAGNOSIS — N186 End stage renal disease: Secondary | ICD-10-CM | POA: Diagnosis not present

## 2014-01-15 DIAGNOSIS — D509 Iron deficiency anemia, unspecified: Secondary | ICD-10-CM | POA: Diagnosis not present

## 2014-01-15 DIAGNOSIS — E119 Type 2 diabetes mellitus without complications: Secondary | ICD-10-CM | POA: Diagnosis not present

## 2014-01-15 DIAGNOSIS — J13 Pneumonia due to Streptococcus pneumoniae: Secondary | ICD-10-CM | POA: Diagnosis not present

## 2014-01-15 DIAGNOSIS — D689 Coagulation defect, unspecified: Secondary | ICD-10-CM | POA: Diagnosis not present

## 2014-01-17 DIAGNOSIS — D689 Coagulation defect, unspecified: Secondary | ICD-10-CM | POA: Diagnosis not present

## 2014-01-17 DIAGNOSIS — E119 Type 2 diabetes mellitus without complications: Secondary | ICD-10-CM | POA: Diagnosis not present

## 2014-01-17 DIAGNOSIS — N186 End stage renal disease: Secondary | ICD-10-CM | POA: Diagnosis not present

## 2014-01-17 DIAGNOSIS — D509 Iron deficiency anemia, unspecified: Secondary | ICD-10-CM | POA: Diagnosis not present

## 2014-01-17 DIAGNOSIS — D631 Anemia in chronic kidney disease: Secondary | ICD-10-CM | POA: Diagnosis not present

## 2014-01-17 DIAGNOSIS — J13 Pneumonia due to Streptococcus pneumoniae: Secondary | ICD-10-CM | POA: Diagnosis not present

## 2014-01-18 ENCOUNTER — Encounter (HOSPITAL_COMMUNITY): Payer: Self-pay | Admitting: Cardiology

## 2014-01-19 DIAGNOSIS — J13 Pneumonia due to Streptococcus pneumoniae: Secondary | ICD-10-CM | POA: Diagnosis not present

## 2014-01-19 DIAGNOSIS — N186 End stage renal disease: Secondary | ICD-10-CM | POA: Diagnosis not present

## 2014-01-19 DIAGNOSIS — D509 Iron deficiency anemia, unspecified: Secondary | ICD-10-CM | POA: Diagnosis not present

## 2014-01-19 DIAGNOSIS — D689 Coagulation defect, unspecified: Secondary | ICD-10-CM | POA: Diagnosis not present

## 2014-01-19 DIAGNOSIS — E119 Type 2 diabetes mellitus without complications: Secondary | ICD-10-CM | POA: Diagnosis not present

## 2014-01-19 DIAGNOSIS — D631 Anemia in chronic kidney disease: Secondary | ICD-10-CM | POA: Diagnosis not present

## 2014-01-22 DIAGNOSIS — J13 Pneumonia due to Streptococcus pneumoniae: Secondary | ICD-10-CM | POA: Diagnosis not present

## 2014-01-22 DIAGNOSIS — D631 Anemia in chronic kidney disease: Secondary | ICD-10-CM | POA: Diagnosis not present

## 2014-01-22 DIAGNOSIS — N186 End stage renal disease: Secondary | ICD-10-CM | POA: Diagnosis not present

## 2014-01-22 DIAGNOSIS — E119 Type 2 diabetes mellitus without complications: Secondary | ICD-10-CM | POA: Diagnosis not present

## 2014-01-22 DIAGNOSIS — D689 Coagulation defect, unspecified: Secondary | ICD-10-CM | POA: Diagnosis not present

## 2014-01-22 DIAGNOSIS — D509 Iron deficiency anemia, unspecified: Secondary | ICD-10-CM | POA: Diagnosis not present

## 2014-01-24 DIAGNOSIS — J13 Pneumonia due to Streptococcus pneumoniae: Secondary | ICD-10-CM | POA: Diagnosis not present

## 2014-01-24 DIAGNOSIS — D689 Coagulation defect, unspecified: Secondary | ICD-10-CM | POA: Diagnosis not present

## 2014-01-24 DIAGNOSIS — E119 Type 2 diabetes mellitus without complications: Secondary | ICD-10-CM | POA: Diagnosis not present

## 2014-01-24 DIAGNOSIS — D631 Anemia in chronic kidney disease: Secondary | ICD-10-CM | POA: Diagnosis not present

## 2014-01-24 DIAGNOSIS — D509 Iron deficiency anemia, unspecified: Secondary | ICD-10-CM | POA: Diagnosis not present

## 2014-01-24 DIAGNOSIS — N186 End stage renal disease: Secondary | ICD-10-CM | POA: Diagnosis not present

## 2014-01-26 DIAGNOSIS — D689 Coagulation defect, unspecified: Secondary | ICD-10-CM | POA: Diagnosis not present

## 2014-01-26 DIAGNOSIS — J13 Pneumonia due to Streptococcus pneumoniae: Secondary | ICD-10-CM | POA: Diagnosis not present

## 2014-01-26 DIAGNOSIS — D509 Iron deficiency anemia, unspecified: Secondary | ICD-10-CM | POA: Diagnosis not present

## 2014-01-26 DIAGNOSIS — D631 Anemia in chronic kidney disease: Secondary | ICD-10-CM | POA: Diagnosis not present

## 2014-01-26 DIAGNOSIS — E119 Type 2 diabetes mellitus without complications: Secondary | ICD-10-CM | POA: Diagnosis not present

## 2014-01-26 DIAGNOSIS — N186 End stage renal disease: Secondary | ICD-10-CM | POA: Diagnosis not present

## 2014-01-29 DIAGNOSIS — E119 Type 2 diabetes mellitus without complications: Secondary | ICD-10-CM | POA: Diagnosis not present

## 2014-01-29 DIAGNOSIS — N186 End stage renal disease: Secondary | ICD-10-CM | POA: Diagnosis not present

## 2014-01-29 DIAGNOSIS — D631 Anemia in chronic kidney disease: Secondary | ICD-10-CM | POA: Diagnosis not present

## 2014-01-29 DIAGNOSIS — J13 Pneumonia due to Streptococcus pneumoniae: Secondary | ICD-10-CM | POA: Diagnosis not present

## 2014-01-29 DIAGNOSIS — D509 Iron deficiency anemia, unspecified: Secondary | ICD-10-CM | POA: Diagnosis not present

## 2014-01-29 DIAGNOSIS — D689 Coagulation defect, unspecified: Secondary | ICD-10-CM | POA: Diagnosis not present

## 2014-01-30 DIAGNOSIS — H1011 Acute atopic conjunctivitis, right eye: Secondary | ICD-10-CM | POA: Diagnosis not present

## 2014-01-31 DIAGNOSIS — D689 Coagulation defect, unspecified: Secondary | ICD-10-CM | POA: Diagnosis not present

## 2014-01-31 DIAGNOSIS — D509 Iron deficiency anemia, unspecified: Secondary | ICD-10-CM | POA: Diagnosis not present

## 2014-01-31 DIAGNOSIS — N186 End stage renal disease: Secondary | ICD-10-CM | POA: Diagnosis not present

## 2014-01-31 DIAGNOSIS — E119 Type 2 diabetes mellitus without complications: Secondary | ICD-10-CM | POA: Diagnosis not present

## 2014-01-31 DIAGNOSIS — D631 Anemia in chronic kidney disease: Secondary | ICD-10-CM | POA: Diagnosis not present

## 2014-01-31 DIAGNOSIS — J13 Pneumonia due to Streptococcus pneumoniae: Secondary | ICD-10-CM | POA: Diagnosis not present

## 2014-02-02 DIAGNOSIS — D631 Anemia in chronic kidney disease: Secondary | ICD-10-CM | POA: Diagnosis not present

## 2014-02-02 DIAGNOSIS — N186 End stage renal disease: Secondary | ICD-10-CM | POA: Diagnosis not present

## 2014-02-02 DIAGNOSIS — D689 Coagulation defect, unspecified: Secondary | ICD-10-CM | POA: Diagnosis not present

## 2014-02-02 DIAGNOSIS — E119 Type 2 diabetes mellitus without complications: Secondary | ICD-10-CM | POA: Diagnosis not present

## 2014-02-02 DIAGNOSIS — J13 Pneumonia due to Streptococcus pneumoniae: Secondary | ICD-10-CM | POA: Diagnosis not present

## 2014-02-02 DIAGNOSIS — D509 Iron deficiency anemia, unspecified: Secondary | ICD-10-CM | POA: Diagnosis not present

## 2014-02-04 DIAGNOSIS — Z992 Dependence on renal dialysis: Secondary | ICD-10-CM | POA: Diagnosis not present

## 2014-02-04 DIAGNOSIS — N186 End stage renal disease: Secondary | ICD-10-CM | POA: Diagnosis not present

## 2014-02-05 DIAGNOSIS — D509 Iron deficiency anemia, unspecified: Secondary | ICD-10-CM | POA: Diagnosis not present

## 2014-02-05 DIAGNOSIS — N186 End stage renal disease: Secondary | ICD-10-CM | POA: Diagnosis not present

## 2014-02-05 DIAGNOSIS — D689 Coagulation defect, unspecified: Secondary | ICD-10-CM | POA: Diagnosis not present

## 2014-02-05 DIAGNOSIS — E119 Type 2 diabetes mellitus without complications: Secondary | ICD-10-CM | POA: Diagnosis not present

## 2014-02-05 DIAGNOSIS — D631 Anemia in chronic kidney disease: Secondary | ICD-10-CM | POA: Diagnosis not present

## 2014-02-07 DIAGNOSIS — N186 End stage renal disease: Secondary | ICD-10-CM | POA: Diagnosis not present

## 2014-02-07 DIAGNOSIS — D631 Anemia in chronic kidney disease: Secondary | ICD-10-CM | POA: Diagnosis not present

## 2014-02-07 DIAGNOSIS — D509 Iron deficiency anemia, unspecified: Secondary | ICD-10-CM | POA: Diagnosis not present

## 2014-02-07 DIAGNOSIS — E119 Type 2 diabetes mellitus without complications: Secondary | ICD-10-CM | POA: Diagnosis not present

## 2014-02-07 DIAGNOSIS — D689 Coagulation defect, unspecified: Secondary | ICD-10-CM | POA: Diagnosis not present

## 2014-02-09 DIAGNOSIS — N186 End stage renal disease: Secondary | ICD-10-CM | POA: Diagnosis not present

## 2014-02-09 DIAGNOSIS — D631 Anemia in chronic kidney disease: Secondary | ICD-10-CM | POA: Diagnosis not present

## 2014-02-09 DIAGNOSIS — D689 Coagulation defect, unspecified: Secondary | ICD-10-CM | POA: Diagnosis not present

## 2014-02-09 DIAGNOSIS — D509 Iron deficiency anemia, unspecified: Secondary | ICD-10-CM | POA: Diagnosis not present

## 2014-02-09 DIAGNOSIS — E119 Type 2 diabetes mellitus without complications: Secondary | ICD-10-CM | POA: Diagnosis not present

## 2014-02-12 DIAGNOSIS — N186 End stage renal disease: Secondary | ICD-10-CM | POA: Diagnosis not present

## 2014-02-12 DIAGNOSIS — E119 Type 2 diabetes mellitus without complications: Secondary | ICD-10-CM | POA: Diagnosis not present

## 2014-02-12 DIAGNOSIS — D689 Coagulation defect, unspecified: Secondary | ICD-10-CM | POA: Diagnosis not present

## 2014-02-12 DIAGNOSIS — D509 Iron deficiency anemia, unspecified: Secondary | ICD-10-CM | POA: Diagnosis not present

## 2014-02-12 DIAGNOSIS — D631 Anemia in chronic kidney disease: Secondary | ICD-10-CM | POA: Diagnosis not present

## 2014-02-14 DIAGNOSIS — D689 Coagulation defect, unspecified: Secondary | ICD-10-CM | POA: Diagnosis not present

## 2014-02-14 DIAGNOSIS — E119 Type 2 diabetes mellitus without complications: Secondary | ICD-10-CM | POA: Diagnosis not present

## 2014-02-14 DIAGNOSIS — D509 Iron deficiency anemia, unspecified: Secondary | ICD-10-CM | POA: Diagnosis not present

## 2014-02-14 DIAGNOSIS — N186 End stage renal disease: Secondary | ICD-10-CM | POA: Diagnosis not present

## 2014-02-14 DIAGNOSIS — D631 Anemia in chronic kidney disease: Secondary | ICD-10-CM | POA: Diagnosis not present

## 2014-02-16 DIAGNOSIS — D631 Anemia in chronic kidney disease: Secondary | ICD-10-CM | POA: Diagnosis not present

## 2014-02-16 DIAGNOSIS — D689 Coagulation defect, unspecified: Secondary | ICD-10-CM | POA: Diagnosis not present

## 2014-02-16 DIAGNOSIS — D509 Iron deficiency anemia, unspecified: Secondary | ICD-10-CM | POA: Diagnosis not present

## 2014-02-16 DIAGNOSIS — E119 Type 2 diabetes mellitus without complications: Secondary | ICD-10-CM | POA: Diagnosis not present

## 2014-02-16 DIAGNOSIS — N186 End stage renal disease: Secondary | ICD-10-CM | POA: Diagnosis not present

## 2014-02-19 DIAGNOSIS — N186 End stage renal disease: Secondary | ICD-10-CM | POA: Diagnosis not present

## 2014-02-19 DIAGNOSIS — D509 Iron deficiency anemia, unspecified: Secondary | ICD-10-CM | POA: Diagnosis not present

## 2014-02-19 DIAGNOSIS — D631 Anemia in chronic kidney disease: Secondary | ICD-10-CM | POA: Diagnosis not present

## 2014-02-19 DIAGNOSIS — E119 Type 2 diabetes mellitus without complications: Secondary | ICD-10-CM | POA: Diagnosis not present

## 2014-02-19 DIAGNOSIS — D689 Coagulation defect, unspecified: Secondary | ICD-10-CM | POA: Diagnosis not present

## 2014-02-21 DIAGNOSIS — E119 Type 2 diabetes mellitus without complications: Secondary | ICD-10-CM | POA: Diagnosis not present

## 2014-02-21 DIAGNOSIS — D631 Anemia in chronic kidney disease: Secondary | ICD-10-CM | POA: Diagnosis not present

## 2014-02-21 DIAGNOSIS — N186 End stage renal disease: Secondary | ICD-10-CM | POA: Diagnosis not present

## 2014-02-21 DIAGNOSIS — D509 Iron deficiency anemia, unspecified: Secondary | ICD-10-CM | POA: Diagnosis not present

## 2014-02-21 DIAGNOSIS — D689 Coagulation defect, unspecified: Secondary | ICD-10-CM | POA: Diagnosis not present

## 2014-02-23 DIAGNOSIS — D509 Iron deficiency anemia, unspecified: Secondary | ICD-10-CM | POA: Diagnosis not present

## 2014-02-23 DIAGNOSIS — D689 Coagulation defect, unspecified: Secondary | ICD-10-CM | POA: Diagnosis not present

## 2014-02-23 DIAGNOSIS — D631 Anemia in chronic kidney disease: Secondary | ICD-10-CM | POA: Diagnosis not present

## 2014-02-23 DIAGNOSIS — E119 Type 2 diabetes mellitus without complications: Secondary | ICD-10-CM | POA: Diagnosis not present

## 2014-02-23 DIAGNOSIS — N186 End stage renal disease: Secondary | ICD-10-CM | POA: Diagnosis not present

## 2014-02-26 DIAGNOSIS — D689 Coagulation defect, unspecified: Secondary | ICD-10-CM | POA: Diagnosis not present

## 2014-02-26 DIAGNOSIS — D509 Iron deficiency anemia, unspecified: Secondary | ICD-10-CM | POA: Diagnosis not present

## 2014-02-26 DIAGNOSIS — E119 Type 2 diabetes mellitus without complications: Secondary | ICD-10-CM | POA: Diagnosis not present

## 2014-02-26 DIAGNOSIS — D631 Anemia in chronic kidney disease: Secondary | ICD-10-CM | POA: Diagnosis not present

## 2014-02-26 DIAGNOSIS — N186 End stage renal disease: Secondary | ICD-10-CM | POA: Diagnosis not present

## 2014-02-28 DIAGNOSIS — D509 Iron deficiency anemia, unspecified: Secondary | ICD-10-CM | POA: Diagnosis not present

## 2014-02-28 DIAGNOSIS — D631 Anemia in chronic kidney disease: Secondary | ICD-10-CM | POA: Diagnosis not present

## 2014-02-28 DIAGNOSIS — N186 End stage renal disease: Secondary | ICD-10-CM | POA: Diagnosis not present

## 2014-02-28 DIAGNOSIS — E119 Type 2 diabetes mellitus without complications: Secondary | ICD-10-CM | POA: Diagnosis not present

## 2014-02-28 DIAGNOSIS — D689 Coagulation defect, unspecified: Secondary | ICD-10-CM | POA: Diagnosis not present

## 2014-03-02 DIAGNOSIS — N186 End stage renal disease: Secondary | ICD-10-CM | POA: Diagnosis not present

## 2014-03-02 DIAGNOSIS — D509 Iron deficiency anemia, unspecified: Secondary | ICD-10-CM | POA: Diagnosis not present

## 2014-03-02 DIAGNOSIS — D689 Coagulation defect, unspecified: Secondary | ICD-10-CM | POA: Diagnosis not present

## 2014-03-02 DIAGNOSIS — D631 Anemia in chronic kidney disease: Secondary | ICD-10-CM | POA: Diagnosis not present

## 2014-03-02 DIAGNOSIS — E119 Type 2 diabetes mellitus without complications: Secondary | ICD-10-CM | POA: Diagnosis not present

## 2014-03-05 DIAGNOSIS — N186 End stage renal disease: Secondary | ICD-10-CM | POA: Diagnosis not present

## 2014-03-05 DIAGNOSIS — Z992 Dependence on renal dialysis: Secondary | ICD-10-CM | POA: Diagnosis not present

## 2014-03-05 DIAGNOSIS — D689 Coagulation defect, unspecified: Secondary | ICD-10-CM | POA: Diagnosis not present

## 2014-03-05 DIAGNOSIS — E119 Type 2 diabetes mellitus without complications: Secondary | ICD-10-CM | POA: Diagnosis not present

## 2014-03-05 DIAGNOSIS — D631 Anemia in chronic kidney disease: Secondary | ICD-10-CM | POA: Diagnosis not present

## 2014-03-05 DIAGNOSIS — D509 Iron deficiency anemia, unspecified: Secondary | ICD-10-CM | POA: Diagnosis not present

## 2014-03-07 DIAGNOSIS — N186 End stage renal disease: Secondary | ICD-10-CM | POA: Diagnosis not present

## 2014-03-07 DIAGNOSIS — E119 Type 2 diabetes mellitus without complications: Secondary | ICD-10-CM | POA: Diagnosis not present

## 2014-03-07 DIAGNOSIS — D631 Anemia in chronic kidney disease: Secondary | ICD-10-CM | POA: Diagnosis not present

## 2014-03-09 DIAGNOSIS — E119 Type 2 diabetes mellitus without complications: Secondary | ICD-10-CM | POA: Diagnosis not present

## 2014-03-09 DIAGNOSIS — D631 Anemia in chronic kidney disease: Secondary | ICD-10-CM | POA: Diagnosis not present

## 2014-03-09 DIAGNOSIS — N186 End stage renal disease: Secondary | ICD-10-CM | POA: Diagnosis not present

## 2014-03-12 DIAGNOSIS — N186 End stage renal disease: Secondary | ICD-10-CM | POA: Diagnosis not present

## 2014-03-12 DIAGNOSIS — D631 Anemia in chronic kidney disease: Secondary | ICD-10-CM | POA: Diagnosis not present

## 2014-03-12 DIAGNOSIS — E119 Type 2 diabetes mellitus without complications: Secondary | ICD-10-CM | POA: Diagnosis not present

## 2014-03-13 DIAGNOSIS — H179 Unspecified corneal scar and opacity: Secondary | ICD-10-CM | POA: Diagnosis not present

## 2014-03-13 LAB — HM DIABETES EYE EXAM

## 2014-03-14 DIAGNOSIS — D631 Anemia in chronic kidney disease: Secondary | ICD-10-CM | POA: Diagnosis not present

## 2014-03-14 DIAGNOSIS — N186 End stage renal disease: Secondary | ICD-10-CM | POA: Diagnosis not present

## 2014-03-14 DIAGNOSIS — E119 Type 2 diabetes mellitus without complications: Secondary | ICD-10-CM | POA: Diagnosis not present

## 2014-03-14 LAB — HM COLONOSCOPY

## 2014-03-16 DIAGNOSIS — N186 End stage renal disease: Secondary | ICD-10-CM | POA: Diagnosis not present

## 2014-03-16 DIAGNOSIS — E119 Type 2 diabetes mellitus without complications: Secondary | ICD-10-CM | POA: Diagnosis not present

## 2014-03-16 DIAGNOSIS — D631 Anemia in chronic kidney disease: Secondary | ICD-10-CM | POA: Diagnosis not present

## 2014-03-19 DIAGNOSIS — N186 End stage renal disease: Secondary | ICD-10-CM | POA: Diagnosis not present

## 2014-03-19 DIAGNOSIS — E119 Type 2 diabetes mellitus without complications: Secondary | ICD-10-CM | POA: Diagnosis not present

## 2014-03-19 DIAGNOSIS — D631 Anemia in chronic kidney disease: Secondary | ICD-10-CM | POA: Diagnosis not present

## 2014-03-21 ENCOUNTER — Encounter: Payer: Self-pay | Admitting: Internal Medicine

## 2014-03-21 DIAGNOSIS — E119 Type 2 diabetes mellitus without complications: Secondary | ICD-10-CM | POA: Diagnosis not present

## 2014-03-21 DIAGNOSIS — N186 End stage renal disease: Secondary | ICD-10-CM | POA: Diagnosis not present

## 2014-03-21 DIAGNOSIS — D631 Anemia in chronic kidney disease: Secondary | ICD-10-CM | POA: Diagnosis not present

## 2014-03-23 DIAGNOSIS — D631 Anemia in chronic kidney disease: Secondary | ICD-10-CM | POA: Diagnosis not present

## 2014-03-23 DIAGNOSIS — E119 Type 2 diabetes mellitus without complications: Secondary | ICD-10-CM | POA: Diagnosis not present

## 2014-03-23 DIAGNOSIS — N186 End stage renal disease: Secondary | ICD-10-CM | POA: Diagnosis not present

## 2014-03-26 DIAGNOSIS — N186 End stage renal disease: Secondary | ICD-10-CM | POA: Diagnosis not present

## 2014-03-26 DIAGNOSIS — D631 Anemia in chronic kidney disease: Secondary | ICD-10-CM | POA: Diagnosis not present

## 2014-03-26 DIAGNOSIS — E119 Type 2 diabetes mellitus without complications: Secondary | ICD-10-CM | POA: Diagnosis not present

## 2014-03-28 DIAGNOSIS — D631 Anemia in chronic kidney disease: Secondary | ICD-10-CM | POA: Diagnosis not present

## 2014-03-28 DIAGNOSIS — N186 End stage renal disease: Secondary | ICD-10-CM | POA: Diagnosis not present

## 2014-03-28 DIAGNOSIS — E119 Type 2 diabetes mellitus without complications: Secondary | ICD-10-CM | POA: Diagnosis not present

## 2014-03-30 DIAGNOSIS — E119 Type 2 diabetes mellitus without complications: Secondary | ICD-10-CM | POA: Diagnosis not present

## 2014-03-30 DIAGNOSIS — N186 End stage renal disease: Secondary | ICD-10-CM | POA: Diagnosis not present

## 2014-03-30 DIAGNOSIS — D631 Anemia in chronic kidney disease: Secondary | ICD-10-CM | POA: Diagnosis not present

## 2014-04-02 ENCOUNTER — Other Ambulatory Visit: Payer: Self-pay | Admitting: Internal Medicine

## 2014-04-02 DIAGNOSIS — E119 Type 2 diabetes mellitus without complications: Secondary | ICD-10-CM | POA: Diagnosis not present

## 2014-04-02 DIAGNOSIS — N186 End stage renal disease: Secondary | ICD-10-CM | POA: Diagnosis not present

## 2014-04-02 DIAGNOSIS — D631 Anemia in chronic kidney disease: Secondary | ICD-10-CM | POA: Diagnosis not present

## 2014-04-04 DIAGNOSIS — E119 Type 2 diabetes mellitus without complications: Secondary | ICD-10-CM | POA: Diagnosis not present

## 2014-04-04 DIAGNOSIS — N186 End stage renal disease: Secondary | ICD-10-CM | POA: Diagnosis not present

## 2014-04-04 DIAGNOSIS — D631 Anemia in chronic kidney disease: Secondary | ICD-10-CM | POA: Diagnosis not present

## 2014-04-05 DIAGNOSIS — T82858D Stenosis of vascular prosthetic devices, implants and grafts, subsequent encounter: Secondary | ICD-10-CM | POA: Diagnosis not present

## 2014-04-05 DIAGNOSIS — Z992 Dependence on renal dialysis: Secondary | ICD-10-CM | POA: Diagnosis not present

## 2014-04-05 DIAGNOSIS — I871 Compression of vein: Secondary | ICD-10-CM | POA: Diagnosis not present

## 2014-04-05 DIAGNOSIS — I120 Hypertensive chronic kidney disease with stage 5 chronic kidney disease or end stage renal disease: Secondary | ICD-10-CM | POA: Diagnosis not present

## 2014-04-05 DIAGNOSIS — N186 End stage renal disease: Secondary | ICD-10-CM | POA: Diagnosis not present

## 2014-04-06 DIAGNOSIS — N186 End stage renal disease: Secondary | ICD-10-CM | POA: Diagnosis not present

## 2014-04-06 DIAGNOSIS — E119 Type 2 diabetes mellitus without complications: Secondary | ICD-10-CM | POA: Diagnosis not present

## 2014-04-09 DIAGNOSIS — N186 End stage renal disease: Secondary | ICD-10-CM | POA: Diagnosis not present

## 2014-04-09 DIAGNOSIS — E119 Type 2 diabetes mellitus without complications: Secondary | ICD-10-CM | POA: Diagnosis not present

## 2014-04-11 DIAGNOSIS — N186 End stage renal disease: Secondary | ICD-10-CM | POA: Diagnosis not present

## 2014-04-11 DIAGNOSIS — E119 Type 2 diabetes mellitus without complications: Secondary | ICD-10-CM | POA: Diagnosis not present

## 2014-04-13 DIAGNOSIS — N186 End stage renal disease: Secondary | ICD-10-CM | POA: Diagnosis not present

## 2014-04-13 DIAGNOSIS — E119 Type 2 diabetes mellitus without complications: Secondary | ICD-10-CM | POA: Diagnosis not present

## 2014-04-16 DIAGNOSIS — N186 End stage renal disease: Secondary | ICD-10-CM | POA: Diagnosis not present

## 2014-04-16 DIAGNOSIS — E119 Type 2 diabetes mellitus without complications: Secondary | ICD-10-CM | POA: Diagnosis not present

## 2014-04-19 DIAGNOSIS — E119 Type 2 diabetes mellitus without complications: Secondary | ICD-10-CM | POA: Diagnosis not present

## 2014-04-19 DIAGNOSIS — N186 End stage renal disease: Secondary | ICD-10-CM | POA: Diagnosis not present

## 2014-04-20 DIAGNOSIS — N186 End stage renal disease: Secondary | ICD-10-CM | POA: Diagnosis not present

## 2014-04-20 DIAGNOSIS — E119 Type 2 diabetes mellitus without complications: Secondary | ICD-10-CM | POA: Diagnosis not present

## 2014-04-23 DIAGNOSIS — E119 Type 2 diabetes mellitus without complications: Secondary | ICD-10-CM | POA: Diagnosis not present

## 2014-04-23 DIAGNOSIS — N186 End stage renal disease: Secondary | ICD-10-CM | POA: Diagnosis not present

## 2014-04-25 DIAGNOSIS — E119 Type 2 diabetes mellitus without complications: Secondary | ICD-10-CM | POA: Diagnosis not present

## 2014-04-25 DIAGNOSIS — N186 End stage renal disease: Secondary | ICD-10-CM | POA: Diagnosis not present

## 2014-04-27 ENCOUNTER — Other Ambulatory Visit: Payer: Self-pay | Admitting: Cardiovascular Disease

## 2014-04-27 DIAGNOSIS — E119 Type 2 diabetes mellitus without complications: Secondary | ICD-10-CM | POA: Diagnosis not present

## 2014-04-27 DIAGNOSIS — N186 End stage renal disease: Secondary | ICD-10-CM | POA: Diagnosis not present

## 2014-04-29 NOTE — Op Note (Signed)
PATIENT NAME:  [name], HOSMAN MR#:  [id number] DATE OF BIRTH:  1920-04-16  DATE OF PROCEDURE:  04/02/2011  PREOPERATIVE DIAGNOSES:  1. End-stage renal disease.  2. Functional permanent access.   POSTOPERATIVE DIAGNOSES:  1. End-stage renal disease.  2. Functional permanent access.   PROCEDURE: Removal of right jugular PermCath.   SURGEON: Algernon Huxley, MD  ANESTHESIA: Local.   ESTIMATED BLOOD LOSS: Minimal.   INDICATION FOR PROCEDURE: 81 year old white female with end-stage renal disease. Her PermCath no longer is necessary for dialysis and this will be removed.   DESCRIPTION OF PROCEDURE: Patient is brought to the vascular interventional radiology area. The existing catheter, right chest and neck were sterilely prepped and draped, a sterile surgical field was created. The area was copiously anesthetized with 1% Xylocaine and hemostats were used to dissect out the cuff. An 11 blade was used to transect the fibrous sheath to the cuff. The catheter was then removed in its entirety without difficulty with gentle traction. Pressure was held. Sterile dressing is placed. The patient tolerated procedure well.   ____________________________ Algernon Huxley, MD jsd:cms D: 04/02/2011 12:12:38 ET T: 04/02/2011 12:53:10 ET JOB#: [id number]  cc: Algernon Huxley, MD, <Dictator> Algernon Huxley MD ELECTRONICALLY SIGNED 04/02/2011 16:22

## 2014-04-30 DIAGNOSIS — N186 End stage renal disease: Secondary | ICD-10-CM | POA: Diagnosis not present

## 2014-04-30 DIAGNOSIS — E119 Type 2 diabetes mellitus without complications: Secondary | ICD-10-CM | POA: Diagnosis not present

## 2014-05-01 ENCOUNTER — Encounter: Payer: Self-pay | Admitting: Cardiovascular Disease

## 2014-05-01 ENCOUNTER — Ambulatory Visit (INDEPENDENT_AMBULATORY_CARE_PROVIDER_SITE_OTHER): Payer: Medicare Other | Admitting: Cardiovascular Disease

## 2014-05-01 VITALS — BP 140/62 | HR 72 | Ht 63.0 in | Wt 112.0 lb

## 2014-05-01 DIAGNOSIS — E1159 Type 2 diabetes mellitus with other circulatory complications: Secondary | ICD-10-CM

## 2014-05-01 DIAGNOSIS — Z9181 History of falling: Secondary | ICD-10-CM

## 2014-05-01 DIAGNOSIS — Z954 Presence of other heart-valve replacement: Secondary | ICD-10-CM

## 2014-05-01 DIAGNOSIS — E1151 Type 2 diabetes mellitus with diabetic peripheral angiopathy without gangrene: Secondary | ICD-10-CM

## 2014-05-01 DIAGNOSIS — Z953 Presence of xenogenic heart valve: Secondary | ICD-10-CM

## 2014-05-01 DIAGNOSIS — I10 Essential (primary) hypertension: Secondary | ICD-10-CM | POA: Diagnosis not present

## 2014-05-01 DIAGNOSIS — Z951 Presence of aortocoronary bypass graft: Secondary | ICD-10-CM | POA: Diagnosis not present

## 2014-05-01 MED ORDER — METOPROLOL TARTRATE 50 MG PO TABS: 50.0000 mg | ORAL_TABLET | Freq: Two times a day (BID) | ORAL | Status: DC

## 2014-05-01 NOTE — Progress Notes (Signed)
Patient ID: [name], female    DOB: [birth year], 81 y.o.   MRN: [id number]  HPI Comments: Mrs Birenbaum is a very pleasant 81 year old woman with history of CAD, bypass surgery x3 vessel in 2009 with bioprosthetic aortic valve placed at that time for severe aortic valve stenosis, diet controlled diabetes, previous smoking, moderate bilateral carotid disease, end-stage renal disease on hemodialysis 3 days per week for the past 2 years who presents for routine followup of her  Coronary artery disease  In follow-up today, she reports that she is doing well. No recent falls. She is still exercising with heart track.  she is still driving She denies any significant chest pain or shortness of breath  Blood pressure has been running low after dialysis  For some reason she is taking metoprolol tartrate 50 mg in the morning, none in the evening  She reports hearing a  "helicopter " 4 days straight at nighttime . Again had a quick burst the other night . Rubbed on her neck and symptoms seem to resolve . Unclear if she is having palpitations or tachycardia, she is not sure   EKG shows normal sinus rhythm with rate 72 bpm, nonspecific ST abnormality   Other past medical history  History of incontinence Total cholesterol 158, hemoglobin A1c 5.9  hemodialysis on March 18 and had syncope. She was taken to the hospital and diagnosed with urinary tract infection with encephalopathy. She was given antibiotics with improvement of her symptoms. Troponin 0.37 and 0.41. This was felt to be secondary to demand ischemia.  Remote history of MRSA and C. Difficile in December 2014 Echocardiogram in late 2014, well-seated valve   Allergies  Allergen Reactions  . Nitrofurantoin Itching  . Captopril Other (See Comments)    REACTION: unspecified  . Enalapril Maleate Cough  . Ramipril Other (See Comments)    REACTION: unspecified  . Sulfa Antibiotics Other (See Comments)    Reaction unknown  . Verapamil Other (See  Comments)    REACTION: unspecified    Outpatient Encounter Prescriptions as of 05/01/2014  Medication Sig  . aspirin EC 81 MG tablet Take 81 mg by mouth at bedtime.  . calcium carbonate (TUMS - DOSED IN MG ELEMENTAL CALCIUM) 500 MG chewable tablet Chew 1 tablet (200 mg of elemental calcium total) by mouth 3 (three) times daily with meals.  . folic acid (FOLVITE) 0.5 MG tablet Take 0.5 tablets (0.5 mg total) by mouth daily.  Marland Kitchen levothyroxine (SYNTHROID, LEVOTHROID) 75 MCG tablet TAKE ONE TABLET BY MOUTH ONCE DAILY  . metoprolol (LOPRESSOR) 50 MG tablet Take 1 tablet (50 mg total) by mouth 2 (two) times daily.  . pravastatin (PRAVACHOL) 20 MG tablet TAKE ONE TABLET BY MOUTH IN THE EVENING  . vitamin C (VITAMIN C) 500 MG tablet Take 1 tablet (500 mg total) by mouth daily.  . [DISCONTINUED] metoprolol (LOPRESSOR) 50 MG tablet Take 50 mg by mouth daily.   . [DISCONTINUED] metoprolol (LOPRESSOR) 50 MG tablet TAKE ONE & ONE-HALF TABLETS BY MOUTH TWICE DAILY (Patient not taking: Reported on 05/01/2014)    Past Medical History  Diagnosis Date  . Anemia     NOS  . Personal history of colonic polyps   . GERD (gastroesophageal reflux disease)   . Hyperlipidemia     takes Simvastatin nightly  . Osteoarthritis   . Osteopenia   . Urinary incontinence   . Pulmonary hypertension   . Anxiety   . Renal insufficiency   . Coronary artery disease   .  Aortic stenosis   . Hyperlipidemia   . Hypertension     takes Metoprolol daily  . Peripheral vascular disease   . CHF (congestive heart failure)   . Migraine     hx of  . Constipation   . Diarrhea   . Oligouria   . History of blood transfusion   . Diabetes mellitus     type II;was on Glipizide but has been off x 29mon  . Hypothyroidism     takes Synthroid daily  . Cancer     Mole on top of head to be removed in July 2013  . UTI (urinary tract infection)     Past Surgical History  Procedure Laterality Date  . Abdominal hysterectomy    .  Tonsillectomy    . Cardiac catheterization  2000    cad  . Replacement total knee bilateral  05/1998  . Cataract extraction    . Coronary artery bypass graft      od  . Adenosine myoview  2007    benign, EF 69%  . Carotid endarterectomy  2011    Right  . Coronary artery bypass graft  2009  . Aortic valve replacement  2009  . Amputation-left great toe  7/12    Dr Marlou Sa  . Traumatic amputation of right dip joint of index finger    . Insertion of dialysis catheter  12/18/2010    Procedure: INSERTION OF DIALYSIS CATHETER;  Surgeon: Mal Misty, MD;  Location: [facility];  Service: Vascular;  Laterality: Right;  . Av fistula placement  12/22/2010    Procedure: ARTERIOVENOUS (AV) FISTULA CREATION;  Surgeon: Hinda Lenis, MD;  Location: [facility];  Service: Vascular;  Laterality: Left;  Creation of Left Brachial-Cephalic Fistula  . Appendectomy    . Eye surgery      BIlateral  . Amputation  06/18/2011    Procedure: AMPUTATION DIGIT;  Surgeon: Newt Minion, MD;  Location: [facility];  Service: Orthopedics;  Laterality: Left;  Left 2nd Toe Amputation at MTP Joint    Social History  reports that she quit smoking about 71 years ago. Her smoking use included Cigarettes. She quit after 15 years of use. She has never used smokeless tobacco. She reports that she drinks alcohol. She reports that she does not use illicit drugs.  Family History Family history is unknown by patient.   Review of Systems  Constitutional: Negative.   HENT:       Funny sound in her ears, sounding like a helicopter  Respiratory: Negative.   Cardiovascular: Negative.   Gastrointestinal: Negative.   Musculoskeletal: Positive for gait problem.  Skin: Negative.   Neurological: Negative.   Hematological: Negative.   Psychiatric/Behavioral: Negative.   All other systems reviewed and are negative.   BP 140/62 mmHg  Pulse 72  Ht 5\' 3"  (1.6 m)  Wt 112 lb (50.803 kg)  BMI 19.84 kg/m2  Physical Exam  Constitutional:  She is oriented to person, place, and time. She appears well-developed and well-nourished.  HENT:  Head: Normocephalic.  Nose: Nose normal.  Mouth/Throat: Oropharynx is clear and moist.  Eyes: Conjunctivae are normal. Pupils are equal, round, and reactive to light.  Neck: Normal range of motion. Neck supple. No JVD present.  Cardiovascular: Normal rate, regular rhythm, S1 normal, S2 normal and intact distal pulses.  Exam reveals no gallop and no friction rub.   Murmur heard.  Systolic murmur is present with a grade of 2/6  Pulmonary/Chest: Effort normal and  breath sounds normal. No respiratory distress. She has no wheezes. She has no rales. She exhibits no tenderness.  Abdominal: Soft. Bowel sounds are normal. She exhibits no distension. There is no tenderness.  Musculoskeletal: Normal range of motion. She exhibits no edema or tenderness.  Lymphadenopathy:    She has no cervical adenopathy.  Neurological: She is alert and oriented to person, place, and time. Coordination normal.  Skin: Skin is warm and dry. No rash noted. No erythema.  Psychiatric: She has a normal mood and affect. Her behavior is normal. Judgment and thought content normal.    Assessment and Plan  Nursing note and vitals reviewed.

## 2014-05-01 NOTE — Assessment & Plan Note (Signed)
Recommended she restart metoprolol 50 mg twice a day. She is unclear why she was only taking this once per day. Previously was taking this twice a day. She does report a funny sound in her head, Like a helicopter. Unclear if she is having tachycardia at nighttime. We'll continue beta blocker, monitor for now. Holter could be done if needed

## 2014-05-01 NOTE — Patient Instructions (Signed)
You are doing well.  Please continue metoprolol 50 mg twice a day Hold the metoprolol in the morning on dialysis days  If blood pressure runs low all of the time,  Decrease the metoprolol down to 25 mg twice a day  (Metoprolol is typically taken twice a day, not once a day)  Please call us if you have new issues that need to be addressed before your next appt.  Your physician wants you to follow-up in: 6 months.  You will receive a reminder letter in the mail two months in advance. If you don't receive a letter, please call our office to schedule the follow-up appointment.

## 2014-05-01 NOTE — Assessment & Plan Note (Signed)
Hemoglobin A1c well-controlled 5.5

## 2014-05-01 NOTE — Assessment & Plan Note (Signed)
Hard of hearing, mild confusion with her medications on today's visit We will discuss repeat echocardiogram on her next visit

## 2014-05-01 NOTE — Assessment & Plan Note (Signed)
Staff reported her gait was somewhat unsteady. She is using a cane. She reports her ambulation is fine, continues to work with heart track. Still driving, needs to get to hemodialysis

## 2014-05-01 NOTE — Assessment & Plan Note (Signed)
Currently with no symptoms of angina. Encouraged her to continue metoprolol twice a day

## 2014-05-02 DIAGNOSIS — N186 End stage renal disease: Secondary | ICD-10-CM | POA: Diagnosis not present

## 2014-05-02 DIAGNOSIS — E119 Type 2 diabetes mellitus without complications: Secondary | ICD-10-CM | POA: Diagnosis not present

## 2014-05-03 ENCOUNTER — Encounter: Payer: Self-pay | Admitting: Internal Medicine

## 2014-05-03 ENCOUNTER — Ambulatory Visit (INDEPENDENT_AMBULATORY_CARE_PROVIDER_SITE_OTHER): Payer: Medicare Other | Admitting: Internal Medicine

## 2014-05-03 VITALS — BP 136/72 | HR 78 | Ht 63.0 in | Wt 112.0 lb

## 2014-05-03 DIAGNOSIS — E039 Hypothyroidism, unspecified: Secondary | ICD-10-CM | POA: Diagnosis not present

## 2014-05-03 DIAGNOSIS — N058 Unspecified nephritic syndrome with other morphologic changes: Secondary | ICD-10-CM | POA: Diagnosis not present

## 2014-05-03 DIAGNOSIS — IMO0002 Reserved for concepts with insufficient information to code with codable children: Secondary | ICD-10-CM

## 2014-05-03 DIAGNOSIS — Z0000 Encounter for general adult medical examination without abnormal findings: Secondary | ICD-10-CM | POA: Diagnosis not present

## 2014-05-03 DIAGNOSIS — N186 End stage renal disease: Secondary | ICD-10-CM | POA: Diagnosis not present

## 2014-05-03 DIAGNOSIS — Z89422 Acquired absence of other left toe(s): Secondary | ICD-10-CM

## 2014-05-03 DIAGNOSIS — I270 Primary pulmonary hypertension: Secondary | ICD-10-CM

## 2014-05-03 DIAGNOSIS — Z23 Encounter for immunization: Secondary | ICD-10-CM | POA: Diagnosis not present

## 2014-05-03 DIAGNOSIS — E1129 Type 2 diabetes mellitus with other diabetic kidney complication: Secondary | ICD-10-CM

## 2014-05-03 DIAGNOSIS — Z992 Dependence on renal dialysis: Secondary | ICD-10-CM

## 2014-05-03 DIAGNOSIS — D696 Thrombocytopenia, unspecified: Secondary | ICD-10-CM

## 2014-05-03 DIAGNOSIS — I272 Pulmonary hypertension, unspecified: Secondary | ICD-10-CM

## 2014-05-03 LAB — HM DIABETES FOOT EXAM

## 2014-05-03 NOTE — Assessment & Plan Note (Signed)
Due for labs Seems to be euthyroid

## 2014-05-03 NOTE — Addendum Note (Signed)
Addended by: Despina Hidden on: 05/03/2014 04:41 PM   Modules accepted: Orders

## 2014-05-03 NOTE — Assessment & Plan Note (Signed)
No longer needs meds Due for labs Some early neurologic changes also

## 2014-05-03 NOTE — Assessment & Plan Note (Signed)
Well healed No pain Good pulses in feet now

## 2014-05-03 NOTE — Assessment & Plan Note (Signed)
Doing well on dialysis Some hypotension after dialysis--- she takes the [facility]

## 2014-05-03 NOTE — Assessment & Plan Note (Signed)
Volume status okay with the dialysis No recent dyspnea

## 2014-05-03 NOTE — Assessment & Plan Note (Signed)
Probably from the valve replacement Will recheck labs today

## 2014-05-03 NOTE — Assessment & Plan Note (Signed)
I have personally reviewed the Medicare Annual Wellness questionnaire and have noted 1. The patient's medical and social history 2. Their use of alcohol, tobacco or illicit drugs 3. Their current medications and supplements 4. The patient's functional ability including ADL's, fall risks, home safety risks and hearing or visual             impairment. 5. Diet and physical activities 6. Evidence for depression or mood disorders  The patients weight, height, BMI and visual acuity have been recorded in the chart I have made referrals, counseling and provided education to the patient based review of the above and I have provided the pt with a written personalized care plan for preventive services.  I have provided you with a copy of your personalized plan for preventive services. Please take the time to review along with your updated medication list.  prevnar today Td also  No cancer screening due to age 3 reasonably active

## 2014-05-03 NOTE — Progress Notes (Signed)
Pre visit review using our clinic review tool, if applicable. No additional management support is needed unless otherwise documented below in the visit note. 

## 2014-05-03 NOTE — Progress Notes (Signed)
Subjective:    Patient ID: [name], female    DOB: Sep 01, 1920, 81 y.o.   MRN: [id number]  HPI Here for Medicare wellness and follow up of chronic medical problems Reviewed advanced directives Sees Dr Rockey Situ, Dr Eugenie Birks (dentist), Dr Posey Pronto (renal), Dr Augustin Coupe (vascular) No hospitalizations Had procedure on left arm shunt in past year No tobacco products Rare wine--no other alcohol Still drives--but not to dialysis Independent with shopping and instrumental ADLs---even mowing her lawn. Son helps her some Reviewed her meds Foggy vision in right eye--left is okay Hearing seems to be okay---has aides Has had several falls Feels down at times with all she puts up with--but brief. Not anhedonic No set exercise--does go to Heart [facility] twice a week usually No obvious cognitive problems  She is concerned about the "helicopter" sound she heard in head Discussed this with Dr Rockey Situ Metoprolol changed to bid   Gets soreness along upper lateral thighs Seems to only bother her if she rubs it  Continues with three times a week dialysis This is going okay  Hasn't been going to Heart Track lately No chest pain No apparent palpitations Some dizziness--recent fall with bang on right forehead Has a cane for stability but inconsistent with its use No syncope No edema  Doesn't check sugars No meds now for this No sores or pain in feet  Current Outpatient Prescriptions on File Prior to Visit  Medication Sig Dispense Refill  . aspirin EC 81 MG tablet Take 81 mg by mouth at bedtime.    . calcium carbonate (TUMS - DOSED IN MG ELEMENTAL CALCIUM) 500 MG chewable tablet Chew 1 tablet (200 mg of elemental calcium total) by mouth 3 (three) times daily with meals.    . folic acid (FOLVITE) 0.5 MG tablet Take 0.5 tablets (0.5 mg total) by mouth daily.    Marland Kitchen levothyroxine (SYNTHROID, LEVOTHROID) 75 MCG tablet TAKE ONE TABLET BY MOUTH ONCE DAILY 30 tablet 11  . metoprolol (LOPRESSOR) 50 MG tablet  Take 1 tablet (50 mg total) by mouth 2 (two) times daily. 180 tablet 3  . pravastatin (PRAVACHOL) 20 MG tablet TAKE ONE TABLET BY MOUTH IN THE EVENING 90 tablet 3  . vitamin C (VITAMIN C) 500 MG tablet Take 1 tablet (500 mg total) by mouth daily.     No current facility-administered medications on file prior to visit.    Allergies  Allergen Reactions  . Nitrofurantoin Itching  . Captopril Other (See Comments)    REACTION: unspecified  . Enalapril Maleate Cough  . Ramipril Other (See Comments)    REACTION: unspecified  . Sulfa Antibiotics Other (See Comments)    Reaction unknown  . Verapamil Other (See Comments)    REACTION: unspecified    Past Medical History  Diagnosis Date  . Anemia     NOS  . Personal history of colonic polyps   . GERD (gastroesophageal reflux disease)   . Hyperlipidemia     takes Simvastatin nightly  . Osteoarthritis   . Osteopenia   . Urinary incontinence   . Pulmonary hypertension   . Anxiety   . Renal insufficiency   . Coronary artery disease   . Aortic stenosis   . Hyperlipidemia   . Hypertension     takes Metoprolol daily  . Peripheral vascular disease   . CHF (congestive heart failure)   . Migraine     hx of  . Constipation   . Diarrhea   . Oligouria   . History  of blood transfusion   . Diabetes mellitus     type II;was on Glipizide but has been off x 2mon  . Hypothyroidism     takes Synthroid daily  . Cancer     Mole on top of head to be removed in July 2013  . UTI (urinary tract infection)     Past Surgical History  Procedure Laterality Date  . Abdominal hysterectomy    . Tonsillectomy    . Cardiac catheterization  2000    cad  . Replacement total knee bilateral  05/1998  . Cataract extraction    . Coronary artery bypass graft      od  . Adenosine myoview  2007    benign, EF 69%  . Carotid endarterectomy  2011    Right  . Coronary artery bypass graft  2009  . Aortic valve replacement  2009  . Amputation-left great  toe  7/12    Dr Marlou Sa  . Traumatic amputation of right dip joint of index finger    . Insertion of dialysis catheter  12/18/2010    Procedure: INSERTION OF DIALYSIS CATHETER;  Surgeon: Mal Misty, MD;  Location: [facility];  Service: Vascular;  Laterality: Right;  . Av fistula placement  12/22/2010    Procedure: ARTERIOVENOUS (AV) FISTULA CREATION;  Surgeon: Hinda Lenis, MD;  Location: [facility];  Service: Vascular;  Laterality: Left;  Creation of Left Brachial-Cephalic Fistula  . Appendectomy    . Eye surgery      BIlateral  . Amputation  06/18/2011    Procedure: AMPUTATION DIGIT;  Surgeon: Newt Minion, MD;  Location: [facility];  Service: Orthopedics;  Laterality: Left;  Left 2nd Toe Amputation at MTP Joint    Family History  Problem Relation Age of Onset  . Family history unknown: Yes    History   Social History  . Marital Status: Married    Spouse Name: [name]  . Number of Children: 1  . Years of Education: [name]   Occupational History  . 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 Smoking status: Former Smoker -- 15 years    Types: Cigarettes    Quit date: 01/06/1943  . Smokeless tobacco: Never Used  . Alcohol Use: Yes     Comment: Wine occasionally  . Drug Use: No  . Sexual Activity: No   Other Topics Concern  . Not on file   Social History Narrative   [name] to make decisions for her   Discussed DNR and she requests --done 05/03/14   No tube feeds if cognitively unaware   Review of Systems  Sleep is variable--- she is satisfied with this Appetite is variable--weight is stable Bowels are regular Sees dentist regularly Wears her seat belt Has easy bruising     Objective:   Physical Exam  Constitutional: She is oriented to person, place, and time. She appears well-developed and well-nourished. No distress.  HENT:  Mouth/Throat: Oropharynx is clear and moist. No oropharyngeal exudate.  Canals and TMs look good  Neck: Normal  range of motion. Neck supple. No thyromegaly present.  Cardiovascular: Normal rate and regular rhythm.  Exam reveals no gallop.   Soft systolic murmur and valve click  Pulmonary/Chest: Effort normal and breath sounds normal. No respiratory distress. She has no wheezes. She has no rales.  Abdominal: Soft. There is no tenderness.  Musculoskeletal: She exhibits no edema.  Lymphadenopathy:    She has no cervical  adenopathy.  Neurological: She is alert and oriented to person, place, and time.  President-- "Obama, ?" 100-93-? [id number] (some trouble at first) Recall 3/3  Mild decrease in sensation in feet  Skin:  No foot lesions Amputation of left 1st and 2nd toes---well healed and no inflammation  Psychiatric: She has a normal mood and affect. Her behavior is normal.          Assessment & Plan:

## 2014-05-04 ENCOUNTER — Telehealth: Payer: Self-pay | Admitting: Radiology

## 2014-05-04 ENCOUNTER — Telehealth: Payer: Self-pay | Admitting: Internal Medicine

## 2014-05-04 ENCOUNTER — Encounter: Payer: Self-pay | Admitting: *Deleted

## 2014-05-04 DIAGNOSIS — E119 Type 2 diabetes mellitus without complications: Secondary | ICD-10-CM | POA: Diagnosis not present

## 2014-05-04 DIAGNOSIS — N186 End stage renal disease: Secondary | ICD-10-CM | POA: Diagnosis not present

## 2014-05-04 LAB — COMPREHENSIVE METABOLIC PANEL
ALT: 16 U/L (ref 0–35)
AST: 25 U/L (ref 0–37)
Albumin: 4.1 g/dL (ref 3.5–5.2)
Alkaline Phosphatase: 144 U/L — ABNORMAL HIGH (ref 39–117)
BUN: 50 mg/dL — ABNORMAL HIGH (ref 6–23)
CALCIUM: 10.3 mg/dL (ref 8.4–10.5)
CO2: 31 mEq/L (ref 19–32)
CREATININE: 3.88 mg/dL — AB (ref 0.40–1.20)
Chloride: 93 mEq/L — ABNORMAL LOW (ref 96–112)
GFR: 11.52 mL/min — CL (ref >60.00)
Glucose, Bld: 75 mg/dL (ref 70–99)
Potassium: 5.5 mEq/L — ABNORMAL HIGH (ref 3.5–5.1)
SODIUM: 138 meq/L (ref 135–145)
Total Bilirubin: 0.5 mg/dL (ref 0.2–1.2)
Total Protein: 8.3 g/dL (ref 6.0–8.3)

## 2014-05-04 LAB — LIPID PANEL
Cholesterol: 162 mg/dL (ref 0–200)
HDL: 54.3 mg/dL (ref >39.00)
LDL CALC: 89 mg/dL (ref 0–99)
NonHDL: 107.7
TRIGLYCERIDES: 93 mg/dL (ref 0.0–149.0)
Total CHOL/HDL Ratio: 3
VLDL: 18.6 mg/dL (ref 0.0–40.0)

## 2014-05-04 LAB — CBC WITH DIFFERENTIAL/PLATELET
BASOS ABS: 0.2 10*3/uL — AB (ref 0.0–0.1)
Basophils Relative: 1.7 % (ref 0.0–3.0)
EOS ABS: 0.6 10*3/uL (ref 0.0–0.7)
Eosinophils Relative: 6 % — ABNORMAL HIGH (ref 0.0–5.0)
HCT: 34.7 % — ABNORMAL LOW (ref 36.0–46.0)
HEMOGLOBIN: 11.6 g/dL — AB (ref 12.0–15.0)
LYMPHS ABS: 1.2 10*3/uL (ref 0.7–4.0)
LYMPHS PCT: 12.6 % (ref 12.0–46.0)
MCHC: 33.5 g/dL (ref 30.0–36.0)
MCV: 100.6 fl — ABNORMAL HIGH (ref 78.0–100.0)
Monocytes Absolute: 1.3 10*3/uL — ABNORMAL HIGH (ref 0.1–1.0)
Monocytes Relative: 13.9 % — ABNORMAL HIGH (ref 3.0–12.0)
Neutro Abs: 6.2 10*3/uL (ref 1.4–7.7)
Neutrophils Relative %: 65.8 % (ref 43.0–77.0)
PLATELETS: 226 10*3/uL (ref 150.0–400.0)
RBC: 3.45 Mil/uL — AB (ref 3.87–5.11)
RDW: 13.6 % (ref 11.5–15.5)
WBC: 9.5 10*3/uL (ref 4.0–10.5)

## 2014-05-04 LAB — T4, FREE: Free T4: 0.94 ng/dL (ref 0.60–1.60)

## 2014-05-04 LAB — HEMOGLOBIN A1C: Hgb A1c MFr Bld: 6.2 % (ref 4.6–6.5)

## 2014-05-04 NOTE — Telephone Encounter (Signed)
Error

## 2014-05-04 NOTE — Telephone Encounter (Signed)
[facility] lab called a critical GFR - 11.52, results sent to Dr Silvio Pate

## 2014-05-04 NOTE — Telephone Encounter (Signed)
Opened in error

## 2014-05-04 NOTE — Telephone Encounter (Signed)
She is on dialysis  

## 2014-05-05 DIAGNOSIS — N186 End stage renal disease: Secondary | ICD-10-CM | POA: Diagnosis not present

## 2014-05-05 DIAGNOSIS — I120 Hypertensive chronic kidney disease with stage 5 chronic kidney disease or end stage renal disease: Secondary | ICD-10-CM | POA: Diagnosis not present

## 2014-05-05 DIAGNOSIS — Z992 Dependence on renal dialysis: Secondary | ICD-10-CM | POA: Diagnosis not present

## 2014-05-07 DIAGNOSIS — D631 Anemia in chronic kidney disease: Secondary | ICD-10-CM | POA: Diagnosis not present

## 2014-05-07 DIAGNOSIS — E119 Type 2 diabetes mellitus without complications: Secondary | ICD-10-CM | POA: Diagnosis not present

## 2014-05-07 DIAGNOSIS — N186 End stage renal disease: Secondary | ICD-10-CM | POA: Diagnosis not present

## 2014-05-09 DIAGNOSIS — E119 Type 2 diabetes mellitus without complications: Secondary | ICD-10-CM | POA: Diagnosis not present

## 2014-05-09 DIAGNOSIS — D631 Anemia in chronic kidney disease: Secondary | ICD-10-CM | POA: Diagnosis not present

## 2014-05-09 DIAGNOSIS — N186 End stage renal disease: Secondary | ICD-10-CM | POA: Diagnosis not present

## 2014-05-11 DIAGNOSIS — D631 Anemia in chronic kidney disease: Secondary | ICD-10-CM | POA: Diagnosis not present

## 2014-05-11 DIAGNOSIS — N186 End stage renal disease: Secondary | ICD-10-CM | POA: Diagnosis not present

## 2014-05-11 DIAGNOSIS — E119 Type 2 diabetes mellitus without complications: Secondary | ICD-10-CM | POA: Diagnosis not present

## 2014-05-14 DIAGNOSIS — D631 Anemia in chronic kidney disease: Secondary | ICD-10-CM | POA: Diagnosis not present

## 2014-05-14 DIAGNOSIS — E119 Type 2 diabetes mellitus without complications: Secondary | ICD-10-CM | POA: Diagnosis not present

## 2014-05-14 DIAGNOSIS — N186 End stage renal disease: Secondary | ICD-10-CM | POA: Diagnosis not present

## 2014-05-16 DIAGNOSIS — E119 Type 2 diabetes mellitus without complications: Secondary | ICD-10-CM | POA: Diagnosis not present

## 2014-05-16 DIAGNOSIS — N186 End stage renal disease: Secondary | ICD-10-CM | POA: Diagnosis not present

## 2014-05-16 DIAGNOSIS — D631 Anemia in chronic kidney disease: Secondary | ICD-10-CM | POA: Diagnosis not present

## 2014-05-18 DIAGNOSIS — N186 End stage renal disease: Secondary | ICD-10-CM | POA: Diagnosis not present

## 2014-05-18 DIAGNOSIS — D631 Anemia in chronic kidney disease: Secondary | ICD-10-CM | POA: Diagnosis not present

## 2014-05-18 DIAGNOSIS — E119 Type 2 diabetes mellitus without complications: Secondary | ICD-10-CM | POA: Diagnosis not present

## 2014-05-21 DIAGNOSIS — D631 Anemia in chronic kidney disease: Secondary | ICD-10-CM | POA: Diagnosis not present

## 2014-05-21 DIAGNOSIS — N186 End stage renal disease: Secondary | ICD-10-CM | POA: Diagnosis not present

## 2014-05-21 DIAGNOSIS — E119 Type 2 diabetes mellitus without complications: Secondary | ICD-10-CM | POA: Diagnosis not present

## 2014-05-23 DIAGNOSIS — D631 Anemia in chronic kidney disease: Secondary | ICD-10-CM | POA: Diagnosis not present

## 2014-05-23 DIAGNOSIS — E119 Type 2 diabetes mellitus without complications: Secondary | ICD-10-CM | POA: Diagnosis not present

## 2014-05-23 DIAGNOSIS — N186 End stage renal disease: Secondary | ICD-10-CM | POA: Diagnosis not present

## 2014-05-25 DIAGNOSIS — E119 Type 2 diabetes mellitus without complications: Secondary | ICD-10-CM | POA: Diagnosis not present

## 2014-05-25 DIAGNOSIS — D631 Anemia in chronic kidney disease: Secondary | ICD-10-CM | POA: Diagnosis not present

## 2014-05-25 DIAGNOSIS — N186 End stage renal disease: Secondary | ICD-10-CM | POA: Diagnosis not present

## 2014-05-28 DIAGNOSIS — E119 Type 2 diabetes mellitus without complications: Secondary | ICD-10-CM | POA: Diagnosis not present

## 2014-05-28 DIAGNOSIS — N186 End stage renal disease: Secondary | ICD-10-CM | POA: Diagnosis not present

## 2014-05-28 DIAGNOSIS — D631 Anemia in chronic kidney disease: Secondary | ICD-10-CM | POA: Diagnosis not present

## 2014-05-30 DIAGNOSIS — E119 Type 2 diabetes mellitus without complications: Secondary | ICD-10-CM | POA: Diagnosis not present

## 2014-05-30 DIAGNOSIS — D631 Anemia in chronic kidney disease: Secondary | ICD-10-CM | POA: Diagnosis not present

## 2014-05-30 DIAGNOSIS — N186 End stage renal disease: Secondary | ICD-10-CM | POA: Diagnosis not present

## 2014-06-01 DIAGNOSIS — D631 Anemia in chronic kidney disease: Secondary | ICD-10-CM | POA: Diagnosis not present

## 2014-06-01 DIAGNOSIS — E119 Type 2 diabetes mellitus without complications: Secondary | ICD-10-CM | POA: Diagnosis not present

## 2014-06-01 DIAGNOSIS — N186 End stage renal disease: Secondary | ICD-10-CM | POA: Diagnosis not present

## 2014-06-04 DIAGNOSIS — N186 End stage renal disease: Secondary | ICD-10-CM | POA: Diagnosis not present

## 2014-06-04 DIAGNOSIS — E119 Type 2 diabetes mellitus without complications: Secondary | ICD-10-CM | POA: Diagnosis not present

## 2014-06-04 DIAGNOSIS — D631 Anemia in chronic kidney disease: Secondary | ICD-10-CM | POA: Diagnosis not present

## 2014-06-05 DIAGNOSIS — N186 End stage renal disease: Secondary | ICD-10-CM | POA: Diagnosis not present

## 2014-06-05 DIAGNOSIS — I120 Hypertensive chronic kidney disease with stage 5 chronic kidney disease or end stage renal disease: Secondary | ICD-10-CM | POA: Diagnosis not present

## 2014-06-05 DIAGNOSIS — Z992 Dependence on renal dialysis: Secondary | ICD-10-CM | POA: Diagnosis not present

## 2014-06-06 DIAGNOSIS — N186 End stage renal disease: Secondary | ICD-10-CM | POA: Diagnosis not present

## 2014-06-06 DIAGNOSIS — E119 Type 2 diabetes mellitus without complications: Secondary | ICD-10-CM | POA: Diagnosis not present

## 2014-06-06 DIAGNOSIS — D631 Anemia in chronic kidney disease: Secondary | ICD-10-CM | POA: Diagnosis not present

## 2014-06-08 DIAGNOSIS — D631 Anemia in chronic kidney disease: Secondary | ICD-10-CM | POA: Diagnosis not present

## 2014-06-08 DIAGNOSIS — E119 Type 2 diabetes mellitus without complications: Secondary | ICD-10-CM | POA: Diagnosis not present

## 2014-06-08 DIAGNOSIS — N186 End stage renal disease: Secondary | ICD-10-CM | POA: Diagnosis not present

## 2014-06-11 DIAGNOSIS — N186 End stage renal disease: Secondary | ICD-10-CM | POA: Diagnosis not present

## 2014-06-11 DIAGNOSIS — D631 Anemia in chronic kidney disease: Secondary | ICD-10-CM | POA: Diagnosis not present

## 2014-06-11 DIAGNOSIS — E119 Type 2 diabetes mellitus without complications: Secondary | ICD-10-CM | POA: Diagnosis not present

## 2014-06-12 DIAGNOSIS — Z961 Presence of intraocular lens: Secondary | ICD-10-CM | POA: Diagnosis not present

## 2014-06-12 DIAGNOSIS — E119 Type 2 diabetes mellitus without complications: Secondary | ICD-10-CM | POA: Diagnosis not present

## 2014-06-12 LAB — HM DIABETES EYE EXAM

## 2014-06-13 ENCOUNTER — Encounter: Payer: Self-pay | Admitting: Internal Medicine

## 2014-06-13 DIAGNOSIS — N186 End stage renal disease: Secondary | ICD-10-CM | POA: Diagnosis not present

## 2014-06-13 DIAGNOSIS — E119 Type 2 diabetes mellitus without complications: Secondary | ICD-10-CM | POA: Diagnosis not present

## 2014-06-13 DIAGNOSIS — D631 Anemia in chronic kidney disease: Secondary | ICD-10-CM | POA: Diagnosis not present

## 2014-06-15 DIAGNOSIS — N186 End stage renal disease: Secondary | ICD-10-CM | POA: Diagnosis not present

## 2014-06-15 DIAGNOSIS — E119 Type 2 diabetes mellitus without complications: Secondary | ICD-10-CM | POA: Diagnosis not present

## 2014-06-15 DIAGNOSIS — D631 Anemia in chronic kidney disease: Secondary | ICD-10-CM | POA: Diagnosis not present

## 2014-06-18 DIAGNOSIS — N186 End stage renal disease: Secondary | ICD-10-CM | POA: Diagnosis not present

## 2014-06-18 DIAGNOSIS — E119 Type 2 diabetes mellitus without complications: Secondary | ICD-10-CM | POA: Diagnosis not present

## 2014-06-18 DIAGNOSIS — D631 Anemia in chronic kidney disease: Secondary | ICD-10-CM | POA: Diagnosis not present

## 2014-06-20 DIAGNOSIS — E119 Type 2 diabetes mellitus without complications: Secondary | ICD-10-CM | POA: Diagnosis not present

## 2014-06-20 DIAGNOSIS — N186 End stage renal disease: Secondary | ICD-10-CM | POA: Diagnosis not present

## 2014-06-20 DIAGNOSIS — D631 Anemia in chronic kidney disease: Secondary | ICD-10-CM | POA: Diagnosis not present

## 2014-06-22 DIAGNOSIS — N186 End stage renal disease: Secondary | ICD-10-CM | POA: Diagnosis not present

## 2014-06-22 DIAGNOSIS — E119 Type 2 diabetes mellitus without complications: Secondary | ICD-10-CM | POA: Diagnosis not present

## 2014-06-22 DIAGNOSIS — D631 Anemia in chronic kidney disease: Secondary | ICD-10-CM | POA: Diagnosis not present

## 2014-06-25 DIAGNOSIS — N186 End stage renal disease: Secondary | ICD-10-CM | POA: Diagnosis not present

## 2014-06-25 DIAGNOSIS — D631 Anemia in chronic kidney disease: Secondary | ICD-10-CM | POA: Diagnosis not present

## 2014-06-25 DIAGNOSIS — E119 Type 2 diabetes mellitus without complications: Secondary | ICD-10-CM | POA: Diagnosis not present

## 2014-06-27 DIAGNOSIS — D631 Anemia in chronic kidney disease: Secondary | ICD-10-CM | POA: Diagnosis not present

## 2014-06-27 DIAGNOSIS — E119 Type 2 diabetes mellitus without complications: Secondary | ICD-10-CM | POA: Diagnosis not present

## 2014-06-27 DIAGNOSIS — N186 End stage renal disease: Secondary | ICD-10-CM | POA: Diagnosis not present

## 2014-06-29 DIAGNOSIS — N186 End stage renal disease: Secondary | ICD-10-CM | POA: Diagnosis not present

## 2014-06-29 DIAGNOSIS — E119 Type 2 diabetes mellitus without complications: Secondary | ICD-10-CM | POA: Diagnosis not present

## 2014-06-29 DIAGNOSIS — D631 Anemia in chronic kidney disease: Secondary | ICD-10-CM | POA: Diagnosis not present

## 2014-07-02 DIAGNOSIS — D631 Anemia in chronic kidney disease: Secondary | ICD-10-CM | POA: Diagnosis not present

## 2014-07-02 DIAGNOSIS — E119 Type 2 diabetes mellitus without complications: Secondary | ICD-10-CM | POA: Diagnosis not present

## 2014-07-02 DIAGNOSIS — N186 End stage renal disease: Secondary | ICD-10-CM | POA: Diagnosis not present

## 2014-07-03 DIAGNOSIS — Z96653 Presence of artificial knee joint, bilateral: Secondary | ICD-10-CM | POA: Diagnosis not present

## 2014-07-03 DIAGNOSIS — Z471 Aftercare following joint replacement surgery: Secondary | ICD-10-CM | POA: Diagnosis not present

## 2014-07-03 DIAGNOSIS — M8580 Other specified disorders of bone density and structure, unspecified site: Secondary | ICD-10-CM | POA: Diagnosis not present

## 2014-07-04 DIAGNOSIS — D631 Anemia in chronic kidney disease: Secondary | ICD-10-CM | POA: Diagnosis not present

## 2014-07-04 DIAGNOSIS — N186 End stage renal disease: Secondary | ICD-10-CM | POA: Diagnosis not present

## 2014-07-04 DIAGNOSIS — E119 Type 2 diabetes mellitus without complications: Secondary | ICD-10-CM | POA: Diagnosis not present

## 2014-07-05 DIAGNOSIS — I120 Hypertensive chronic kidney disease with stage 5 chronic kidney disease or end stage renal disease: Secondary | ICD-10-CM | POA: Diagnosis not present

## 2014-07-05 DIAGNOSIS — Z992 Dependence on renal dialysis: Secondary | ICD-10-CM | POA: Diagnosis not present

## 2014-07-05 DIAGNOSIS — N186 End stage renal disease: Secondary | ICD-10-CM | POA: Diagnosis not present

## 2014-07-06 DIAGNOSIS — N186 End stage renal disease: Secondary | ICD-10-CM | POA: Diagnosis not present

## 2014-07-06 DIAGNOSIS — E119 Type 2 diabetes mellitus without complications: Secondary | ICD-10-CM | POA: Diagnosis not present

## 2014-07-09 DIAGNOSIS — N186 End stage renal disease: Secondary | ICD-10-CM | POA: Diagnosis not present

## 2014-07-09 DIAGNOSIS — E119 Type 2 diabetes mellitus without complications: Secondary | ICD-10-CM | POA: Diagnosis not present

## 2014-07-11 DIAGNOSIS — E119 Type 2 diabetes mellitus without complications: Secondary | ICD-10-CM | POA: Diagnosis not present

## 2014-07-11 DIAGNOSIS — N186 End stage renal disease: Secondary | ICD-10-CM | POA: Diagnosis not present

## 2014-07-13 DIAGNOSIS — E119 Type 2 diabetes mellitus without complications: Secondary | ICD-10-CM | POA: Diagnosis not present

## 2014-07-13 DIAGNOSIS — N186 End stage renal disease: Secondary | ICD-10-CM | POA: Diagnosis not present

## 2014-07-16 DIAGNOSIS — E119 Type 2 diabetes mellitus without complications: Secondary | ICD-10-CM | POA: Diagnosis not present

## 2014-07-16 DIAGNOSIS — N186 End stage renal disease: Secondary | ICD-10-CM | POA: Diagnosis not present

## 2014-07-18 DIAGNOSIS — N186 End stage renal disease: Secondary | ICD-10-CM | POA: Diagnosis not present

## 2014-07-18 DIAGNOSIS — E119 Type 2 diabetes mellitus without complications: Secondary | ICD-10-CM | POA: Diagnosis not present

## 2014-07-20 DIAGNOSIS — E119 Type 2 diabetes mellitus without complications: Secondary | ICD-10-CM | POA: Diagnosis not present

## 2014-07-20 DIAGNOSIS — N186 End stage renal disease: Secondary | ICD-10-CM | POA: Diagnosis not present

## 2014-07-23 DIAGNOSIS — N186 End stage renal disease: Secondary | ICD-10-CM | POA: Diagnosis not present

## 2014-07-23 DIAGNOSIS — E119 Type 2 diabetes mellitus without complications: Secondary | ICD-10-CM | POA: Diagnosis not present

## 2014-07-25 DIAGNOSIS — N186 End stage renal disease: Secondary | ICD-10-CM | POA: Diagnosis not present

## 2014-07-25 DIAGNOSIS — E119 Type 2 diabetes mellitus without complications: Secondary | ICD-10-CM | POA: Diagnosis not present

## 2014-07-27 DIAGNOSIS — E119 Type 2 diabetes mellitus without complications: Secondary | ICD-10-CM | POA: Diagnosis not present

## 2014-07-27 DIAGNOSIS — N186 End stage renal disease: Secondary | ICD-10-CM | POA: Diagnosis not present

## 2014-07-30 DIAGNOSIS — E119 Type 2 diabetes mellitus without complications: Secondary | ICD-10-CM | POA: Diagnosis not present

## 2014-07-30 DIAGNOSIS — N186 End stage renal disease: Secondary | ICD-10-CM | POA: Diagnosis not present

## 2014-08-01 DIAGNOSIS — E119 Type 2 diabetes mellitus without complications: Secondary | ICD-10-CM | POA: Diagnosis not present

## 2014-08-01 DIAGNOSIS — N186 End stage renal disease: Secondary | ICD-10-CM | POA: Diagnosis not present

## 2014-08-02 DIAGNOSIS — N186 End stage renal disease: Secondary | ICD-10-CM | POA: Diagnosis not present

## 2014-08-02 DIAGNOSIS — T82858D Stenosis of vascular prosthetic devices, implants and grafts, subsequent encounter: Secondary | ICD-10-CM | POA: Diagnosis not present

## 2014-08-02 DIAGNOSIS — Z992 Dependence on renal dialysis: Secondary | ICD-10-CM | POA: Diagnosis not present

## 2014-08-02 DIAGNOSIS — I871 Compression of vein: Secondary | ICD-10-CM | POA: Diagnosis not present

## 2014-08-03 DIAGNOSIS — N186 End stage renal disease: Secondary | ICD-10-CM | POA: Diagnosis not present

## 2014-08-03 DIAGNOSIS — E119 Type 2 diabetes mellitus without complications: Secondary | ICD-10-CM | POA: Diagnosis not present

## 2014-08-05 DIAGNOSIS — N186 End stage renal disease: Secondary | ICD-10-CM | POA: Diagnosis not present

## 2014-08-05 DIAGNOSIS — I120 Hypertensive chronic kidney disease with stage 5 chronic kidney disease or end stage renal disease: Secondary | ICD-10-CM | POA: Diagnosis not present

## 2014-08-05 DIAGNOSIS — Z992 Dependence on renal dialysis: Secondary | ICD-10-CM | POA: Diagnosis not present

## 2014-08-06 DIAGNOSIS — D631 Anemia in chronic kidney disease: Secondary | ICD-10-CM | POA: Diagnosis not present

## 2014-08-06 DIAGNOSIS — N186 End stage renal disease: Secondary | ICD-10-CM | POA: Diagnosis not present

## 2014-08-06 DIAGNOSIS — D509 Iron deficiency anemia, unspecified: Secondary | ICD-10-CM | POA: Diagnosis not present

## 2014-08-08 DIAGNOSIS — D631 Anemia in chronic kidney disease: Secondary | ICD-10-CM | POA: Diagnosis not present

## 2014-08-08 DIAGNOSIS — D509 Iron deficiency anemia, unspecified: Secondary | ICD-10-CM | POA: Diagnosis not present

## 2014-08-08 DIAGNOSIS — N186 End stage renal disease: Secondary | ICD-10-CM | POA: Diagnosis not present

## 2014-08-10 DIAGNOSIS — D509 Iron deficiency anemia, unspecified: Secondary | ICD-10-CM | POA: Diagnosis not present

## 2014-08-10 DIAGNOSIS — D631 Anemia in chronic kidney disease: Secondary | ICD-10-CM | POA: Diagnosis not present

## 2014-08-10 DIAGNOSIS — N186 End stage renal disease: Secondary | ICD-10-CM | POA: Diagnosis not present

## 2014-08-13 DIAGNOSIS — D509 Iron deficiency anemia, unspecified: Secondary | ICD-10-CM | POA: Diagnosis not present

## 2014-08-13 DIAGNOSIS — D631 Anemia in chronic kidney disease: Secondary | ICD-10-CM | POA: Diagnosis not present

## 2014-08-13 DIAGNOSIS — N186 End stage renal disease: Secondary | ICD-10-CM | POA: Diagnosis not present

## 2014-08-15 DIAGNOSIS — N186 End stage renal disease: Secondary | ICD-10-CM | POA: Diagnosis not present

## 2014-08-15 DIAGNOSIS — D509 Iron deficiency anemia, unspecified: Secondary | ICD-10-CM | POA: Diagnosis not present

## 2014-08-15 DIAGNOSIS — D631 Anemia in chronic kidney disease: Secondary | ICD-10-CM | POA: Diagnosis not present

## 2014-08-16 DIAGNOSIS — Z85828 Personal history of other malignant neoplasm of skin: Secondary | ICD-10-CM | POA: Diagnosis not present

## 2014-08-16 DIAGNOSIS — D485 Neoplasm of uncertain behavior of skin: Secondary | ICD-10-CM | POA: Diagnosis not present

## 2014-08-16 DIAGNOSIS — C44311 Basal cell carcinoma of skin of nose: Secondary | ICD-10-CM | POA: Diagnosis not present

## 2014-08-16 DIAGNOSIS — D0439 Carcinoma in situ of skin of other parts of face: Secondary | ICD-10-CM | POA: Diagnosis not present

## 2014-08-16 DIAGNOSIS — L570 Actinic keratosis: Secondary | ICD-10-CM | POA: Diagnosis not present

## 2014-08-17 DIAGNOSIS — N186 End stage renal disease: Secondary | ICD-10-CM | POA: Diagnosis not present

## 2014-08-17 DIAGNOSIS — D509 Iron deficiency anemia, unspecified: Secondary | ICD-10-CM | POA: Diagnosis not present

## 2014-08-17 DIAGNOSIS — D631 Anemia in chronic kidney disease: Secondary | ICD-10-CM | POA: Diagnosis not present

## 2014-08-20 DIAGNOSIS — N186 End stage renal disease: Secondary | ICD-10-CM | POA: Diagnosis not present

## 2014-08-20 DIAGNOSIS — D509 Iron deficiency anemia, unspecified: Secondary | ICD-10-CM | POA: Diagnosis not present

## 2014-08-20 DIAGNOSIS — D631 Anemia in chronic kidney disease: Secondary | ICD-10-CM | POA: Diagnosis not present

## 2014-08-22 DIAGNOSIS — N186 End stage renal disease: Secondary | ICD-10-CM | POA: Diagnosis not present

## 2014-08-22 DIAGNOSIS — D509 Iron deficiency anemia, unspecified: Secondary | ICD-10-CM | POA: Diagnosis not present

## 2014-08-22 DIAGNOSIS — D631 Anemia in chronic kidney disease: Secondary | ICD-10-CM | POA: Diagnosis not present

## 2014-08-24 DIAGNOSIS — D631 Anemia in chronic kidney disease: Secondary | ICD-10-CM | POA: Diagnosis not present

## 2014-08-24 DIAGNOSIS — D509 Iron deficiency anemia, unspecified: Secondary | ICD-10-CM | POA: Diagnosis not present

## 2014-08-24 DIAGNOSIS — N186 End stage renal disease: Secondary | ICD-10-CM | POA: Diagnosis not present

## 2014-08-27 DIAGNOSIS — D631 Anemia in chronic kidney disease: Secondary | ICD-10-CM | POA: Diagnosis not present

## 2014-08-27 DIAGNOSIS — D509 Iron deficiency anemia, unspecified: Secondary | ICD-10-CM | POA: Diagnosis not present

## 2014-08-27 DIAGNOSIS — N186 End stage renal disease: Secondary | ICD-10-CM | POA: Diagnosis not present

## 2014-08-29 DIAGNOSIS — D509 Iron deficiency anemia, unspecified: Secondary | ICD-10-CM | POA: Diagnosis not present

## 2014-08-29 DIAGNOSIS — D631 Anemia in chronic kidney disease: Secondary | ICD-10-CM | POA: Diagnosis not present

## 2014-08-29 DIAGNOSIS — N186 End stage renal disease: Secondary | ICD-10-CM | POA: Diagnosis not present

## 2014-08-31 DIAGNOSIS — D631 Anemia in chronic kidney disease: Secondary | ICD-10-CM | POA: Diagnosis not present

## 2014-08-31 DIAGNOSIS — D509 Iron deficiency anemia, unspecified: Secondary | ICD-10-CM | POA: Diagnosis not present

## 2014-08-31 DIAGNOSIS — N186 End stage renal disease: Secondary | ICD-10-CM | POA: Diagnosis not present

## 2014-09-03 DIAGNOSIS — D509 Iron deficiency anemia, unspecified: Secondary | ICD-10-CM | POA: Diagnosis not present

## 2014-09-03 DIAGNOSIS — D631 Anemia in chronic kidney disease: Secondary | ICD-10-CM | POA: Diagnosis not present

## 2014-09-03 DIAGNOSIS — N186 End stage renal disease: Secondary | ICD-10-CM | POA: Diagnosis not present

## 2014-09-05 DIAGNOSIS — I120 Hypertensive chronic kidney disease with stage 5 chronic kidney disease or end stage renal disease: Secondary | ICD-10-CM | POA: Diagnosis not present

## 2014-09-05 DIAGNOSIS — N186 End stage renal disease: Secondary | ICD-10-CM | POA: Diagnosis not present

## 2014-09-05 DIAGNOSIS — D631 Anemia in chronic kidney disease: Secondary | ICD-10-CM | POA: Diagnosis not present

## 2014-09-05 DIAGNOSIS — D509 Iron deficiency anemia, unspecified: Secondary | ICD-10-CM | POA: Diagnosis not present

## 2014-09-05 DIAGNOSIS — Z992 Dependence on renal dialysis: Secondary | ICD-10-CM | POA: Diagnosis not present

## 2014-09-07 DIAGNOSIS — E119 Type 2 diabetes mellitus without complications: Secondary | ICD-10-CM | POA: Diagnosis not present

## 2014-09-07 DIAGNOSIS — N186 End stage renal disease: Secondary | ICD-10-CM | POA: Diagnosis not present

## 2014-09-07 DIAGNOSIS — D509 Iron deficiency anemia, unspecified: Secondary | ICD-10-CM | POA: Diagnosis not present

## 2014-09-07 DIAGNOSIS — D631 Anemia in chronic kidney disease: Secondary | ICD-10-CM | POA: Diagnosis not present

## 2014-09-10 DIAGNOSIS — N186 End stage renal disease: Secondary | ICD-10-CM | POA: Diagnosis not present

## 2014-09-10 DIAGNOSIS — E119 Type 2 diabetes mellitus without complications: Secondary | ICD-10-CM | POA: Diagnosis not present

## 2014-09-10 DIAGNOSIS — D509 Iron deficiency anemia, unspecified: Secondary | ICD-10-CM | POA: Diagnosis not present

## 2014-09-10 DIAGNOSIS — D631 Anemia in chronic kidney disease: Secondary | ICD-10-CM | POA: Diagnosis not present

## 2014-09-11 DIAGNOSIS — Z961 Presence of intraocular lens: Secondary | ICD-10-CM | POA: Diagnosis not present

## 2014-09-12 DIAGNOSIS — D509 Iron deficiency anemia, unspecified: Secondary | ICD-10-CM | POA: Diagnosis not present

## 2014-09-12 DIAGNOSIS — N186 End stage renal disease: Secondary | ICD-10-CM | POA: Diagnosis not present

## 2014-09-12 DIAGNOSIS — D631 Anemia in chronic kidney disease: Secondary | ICD-10-CM | POA: Diagnosis not present

## 2014-09-12 DIAGNOSIS — E119 Type 2 diabetes mellitus without complications: Secondary | ICD-10-CM | POA: Diagnosis not present

## 2014-09-14 DIAGNOSIS — D509 Iron deficiency anemia, unspecified: Secondary | ICD-10-CM | POA: Diagnosis not present

## 2014-09-14 DIAGNOSIS — E119 Type 2 diabetes mellitus without complications: Secondary | ICD-10-CM | POA: Diagnosis not present

## 2014-09-14 DIAGNOSIS — N186 End stage renal disease: Secondary | ICD-10-CM | POA: Diagnosis not present

## 2014-09-14 DIAGNOSIS — D631 Anemia in chronic kidney disease: Secondary | ICD-10-CM | POA: Diagnosis not present

## 2014-09-17 DIAGNOSIS — D509 Iron deficiency anemia, unspecified: Secondary | ICD-10-CM | POA: Diagnosis not present

## 2014-09-17 DIAGNOSIS — N186 End stage renal disease: Secondary | ICD-10-CM | POA: Diagnosis not present

## 2014-09-17 DIAGNOSIS — D631 Anemia in chronic kidney disease: Secondary | ICD-10-CM | POA: Diagnosis not present

## 2014-09-17 DIAGNOSIS — E119 Type 2 diabetes mellitus without complications: Secondary | ICD-10-CM | POA: Diagnosis not present

## 2014-09-18 DIAGNOSIS — D0439 Carcinoma in situ of skin of other parts of face: Secondary | ICD-10-CM | POA: Diagnosis not present

## 2014-09-19 DIAGNOSIS — D509 Iron deficiency anemia, unspecified: Secondary | ICD-10-CM | POA: Diagnosis not present

## 2014-09-19 DIAGNOSIS — D631 Anemia in chronic kidney disease: Secondary | ICD-10-CM | POA: Diagnosis not present

## 2014-09-19 DIAGNOSIS — N186 End stage renal disease: Secondary | ICD-10-CM | POA: Diagnosis not present

## 2014-09-19 DIAGNOSIS — E119 Type 2 diabetes mellitus without complications: Secondary | ICD-10-CM | POA: Diagnosis not present

## 2014-09-21 DIAGNOSIS — D509 Iron deficiency anemia, unspecified: Secondary | ICD-10-CM | POA: Diagnosis not present

## 2014-09-21 DIAGNOSIS — E119 Type 2 diabetes mellitus without complications: Secondary | ICD-10-CM | POA: Diagnosis not present

## 2014-09-21 DIAGNOSIS — N186 End stage renal disease: Secondary | ICD-10-CM | POA: Diagnosis not present

## 2014-09-21 DIAGNOSIS — D631 Anemia in chronic kidney disease: Secondary | ICD-10-CM | POA: Diagnosis not present

## 2014-09-24 DIAGNOSIS — N186 End stage renal disease: Secondary | ICD-10-CM | POA: Diagnosis not present

## 2014-09-24 DIAGNOSIS — D631 Anemia in chronic kidney disease: Secondary | ICD-10-CM | POA: Diagnosis not present

## 2014-09-24 DIAGNOSIS — E119 Type 2 diabetes mellitus without complications: Secondary | ICD-10-CM | POA: Diagnosis not present

## 2014-09-24 DIAGNOSIS — D509 Iron deficiency anemia, unspecified: Secondary | ICD-10-CM | POA: Diagnosis not present

## 2014-09-26 DIAGNOSIS — D509 Iron deficiency anemia, unspecified: Secondary | ICD-10-CM | POA: Diagnosis not present

## 2014-09-26 DIAGNOSIS — E119 Type 2 diabetes mellitus without complications: Secondary | ICD-10-CM | POA: Diagnosis not present

## 2014-09-26 DIAGNOSIS — D631 Anemia in chronic kidney disease: Secondary | ICD-10-CM | POA: Diagnosis not present

## 2014-09-26 DIAGNOSIS — N186 End stage renal disease: Secondary | ICD-10-CM | POA: Diagnosis not present

## 2014-09-27 DIAGNOSIS — C44311 Basal cell carcinoma of skin of nose: Secondary | ICD-10-CM | POA: Diagnosis not present

## 2014-09-28 DIAGNOSIS — D631 Anemia in chronic kidney disease: Secondary | ICD-10-CM | POA: Diagnosis not present

## 2014-09-28 DIAGNOSIS — E119 Type 2 diabetes mellitus without complications: Secondary | ICD-10-CM | POA: Diagnosis not present

## 2014-09-28 DIAGNOSIS — D509 Iron deficiency anemia, unspecified: Secondary | ICD-10-CM | POA: Diagnosis not present

## 2014-09-28 DIAGNOSIS — N186 End stage renal disease: Secondary | ICD-10-CM | POA: Diagnosis not present

## 2014-10-01 DIAGNOSIS — D631 Anemia in chronic kidney disease: Secondary | ICD-10-CM | POA: Diagnosis not present

## 2014-10-01 DIAGNOSIS — N186 End stage renal disease: Secondary | ICD-10-CM | POA: Diagnosis not present

## 2014-10-01 DIAGNOSIS — D509 Iron deficiency anemia, unspecified: Secondary | ICD-10-CM | POA: Diagnosis not present

## 2014-10-01 DIAGNOSIS — E119 Type 2 diabetes mellitus without complications: Secondary | ICD-10-CM | POA: Diagnosis not present

## 2014-10-03 DIAGNOSIS — D631 Anemia in chronic kidney disease: Secondary | ICD-10-CM | POA: Diagnosis not present

## 2014-10-03 DIAGNOSIS — N186 End stage renal disease: Secondary | ICD-10-CM | POA: Diagnosis not present

## 2014-10-03 DIAGNOSIS — D509 Iron deficiency anemia, unspecified: Secondary | ICD-10-CM | POA: Diagnosis not present

## 2014-10-03 DIAGNOSIS — E119 Type 2 diabetes mellitus without complications: Secondary | ICD-10-CM | POA: Diagnosis not present

## 2014-10-05 DIAGNOSIS — I120 Hypertensive chronic kidney disease with stage 5 chronic kidney disease or end stage renal disease: Secondary | ICD-10-CM | POA: Diagnosis not present

## 2014-10-05 DIAGNOSIS — Z992 Dependence on renal dialysis: Secondary | ICD-10-CM | POA: Diagnosis not present

## 2014-10-05 DIAGNOSIS — D631 Anemia in chronic kidney disease: Secondary | ICD-10-CM | POA: Diagnosis not present

## 2014-10-05 DIAGNOSIS — E119 Type 2 diabetes mellitus without complications: Secondary | ICD-10-CM | POA: Diagnosis not present

## 2014-10-05 DIAGNOSIS — N186 End stage renal disease: Secondary | ICD-10-CM | POA: Diagnosis not present

## 2014-10-05 DIAGNOSIS — D509 Iron deficiency anemia, unspecified: Secondary | ICD-10-CM | POA: Diagnosis not present

## 2014-10-08 DIAGNOSIS — D509 Iron deficiency anemia, unspecified: Secondary | ICD-10-CM | POA: Diagnosis not present

## 2014-10-08 DIAGNOSIS — N186 End stage renal disease: Secondary | ICD-10-CM | POA: Diagnosis not present

## 2014-10-08 DIAGNOSIS — D631 Anemia in chronic kidney disease: Secondary | ICD-10-CM | POA: Diagnosis not present

## 2014-10-10 DIAGNOSIS — D631 Anemia in chronic kidney disease: Secondary | ICD-10-CM | POA: Diagnosis not present

## 2014-10-10 DIAGNOSIS — D509 Iron deficiency anemia, unspecified: Secondary | ICD-10-CM | POA: Diagnosis not present

## 2014-10-10 DIAGNOSIS — N186 End stage renal disease: Secondary | ICD-10-CM | POA: Diagnosis not present

## 2014-10-12 DIAGNOSIS — D509 Iron deficiency anemia, unspecified: Secondary | ICD-10-CM | POA: Diagnosis not present

## 2014-10-12 DIAGNOSIS — D631 Anemia in chronic kidney disease: Secondary | ICD-10-CM | POA: Diagnosis not present

## 2014-10-12 DIAGNOSIS — N186 End stage renal disease: Secondary | ICD-10-CM | POA: Diagnosis not present

## 2014-10-15 DIAGNOSIS — D509 Iron deficiency anemia, unspecified: Secondary | ICD-10-CM | POA: Diagnosis not present

## 2014-10-15 DIAGNOSIS — D631 Anemia in chronic kidney disease: Secondary | ICD-10-CM | POA: Diagnosis not present

## 2014-10-15 DIAGNOSIS — N186 End stage renal disease: Secondary | ICD-10-CM | POA: Diagnosis not present

## 2014-10-17 DIAGNOSIS — D631 Anemia in chronic kidney disease: Secondary | ICD-10-CM | POA: Diagnosis not present

## 2014-10-17 DIAGNOSIS — D509 Iron deficiency anemia, unspecified: Secondary | ICD-10-CM | POA: Diagnosis not present

## 2014-10-17 DIAGNOSIS — N186 End stage renal disease: Secondary | ICD-10-CM | POA: Diagnosis not present

## 2014-10-20 DIAGNOSIS — N186 End stage renal disease: Secondary | ICD-10-CM | POA: Diagnosis not present

## 2014-10-20 DIAGNOSIS — D509 Iron deficiency anemia, unspecified: Secondary | ICD-10-CM | POA: Diagnosis not present

## 2014-10-20 DIAGNOSIS — D631 Anemia in chronic kidney disease: Secondary | ICD-10-CM | POA: Diagnosis not present

## 2014-10-22 DIAGNOSIS — D631 Anemia in chronic kidney disease: Secondary | ICD-10-CM | POA: Diagnosis not present

## 2014-10-22 DIAGNOSIS — D509 Iron deficiency anemia, unspecified: Secondary | ICD-10-CM | POA: Diagnosis not present

## 2014-10-22 DIAGNOSIS — N186 End stage renal disease: Secondary | ICD-10-CM | POA: Diagnosis not present

## 2014-10-24 DIAGNOSIS — N186 End stage renal disease: Secondary | ICD-10-CM | POA: Diagnosis not present

## 2014-10-24 DIAGNOSIS — D631 Anemia in chronic kidney disease: Secondary | ICD-10-CM | POA: Diagnosis not present

## 2014-10-24 DIAGNOSIS — D509 Iron deficiency anemia, unspecified: Secondary | ICD-10-CM | POA: Diagnosis not present

## 2014-10-26 DIAGNOSIS — D509 Iron deficiency anemia, unspecified: Secondary | ICD-10-CM | POA: Diagnosis not present

## 2014-10-26 DIAGNOSIS — D631 Anemia in chronic kidney disease: Secondary | ICD-10-CM | POA: Diagnosis not present

## 2014-10-26 DIAGNOSIS — N186 End stage renal disease: Secondary | ICD-10-CM | POA: Diagnosis not present

## 2014-10-29 DIAGNOSIS — N186 End stage renal disease: Secondary | ICD-10-CM | POA: Diagnosis not present

## 2014-10-29 DIAGNOSIS — D509 Iron deficiency anemia, unspecified: Secondary | ICD-10-CM | POA: Diagnosis not present

## 2014-10-29 DIAGNOSIS — D631 Anemia in chronic kidney disease: Secondary | ICD-10-CM | POA: Diagnosis not present

## 2014-10-31 DIAGNOSIS — N186 End stage renal disease: Secondary | ICD-10-CM | POA: Diagnosis not present

## 2014-10-31 DIAGNOSIS — D509 Iron deficiency anemia, unspecified: Secondary | ICD-10-CM | POA: Diagnosis not present

## 2014-10-31 DIAGNOSIS — D631 Anemia in chronic kidney disease: Secondary | ICD-10-CM | POA: Diagnosis not present

## 2014-11-01 ENCOUNTER — Ambulatory Visit: Payer: Medicare Other

## 2014-11-01 ENCOUNTER — Encounter: Payer: Self-pay | Admitting: Cardiovascular Disease

## 2014-11-01 ENCOUNTER — Ambulatory Visit (INDEPENDENT_AMBULATORY_CARE_PROVIDER_SITE_OTHER): Payer: Medicare Other | Admitting: Cardiovascular Disease

## 2014-11-01 VITALS — BP 144/70 | HR 69 | Ht 62.0 in | Wt 113.0 lb

## 2014-11-01 DIAGNOSIS — E785 Hyperlipidemia, unspecified: Secondary | ICD-10-CM

## 2014-11-01 DIAGNOSIS — I25709 Atherosclerosis of coronary artery bypass graft(s), unspecified, with unspecified angina pectoris: Secondary | ICD-10-CM

## 2014-11-01 DIAGNOSIS — Z992 Dependence on renal dialysis: Secondary | ICD-10-CM

## 2014-11-01 DIAGNOSIS — N186 End stage renal disease: Secondary | ICD-10-CM

## 2014-11-01 DIAGNOSIS — Z951 Presence of aortocoronary bypass graft: Secondary | ICD-10-CM

## 2014-11-01 DIAGNOSIS — E1151 Type 2 diabetes mellitus with diabetic peripheral angiopathy without gangrene: Secondary | ICD-10-CM | POA: Diagnosis not present

## 2014-11-01 DIAGNOSIS — Z954 Presence of other heart-valve replacement: Secondary | ICD-10-CM

## 2014-11-01 DIAGNOSIS — I10 Essential (primary) hypertension: Secondary | ICD-10-CM

## 2014-11-01 DIAGNOSIS — Z953 Presence of xenogenic heart valve: Secondary | ICD-10-CM

## 2014-11-01 NOTE — Assessment & Plan Note (Signed)
Cholesterol slightly abovegoal on the current lipid regimen. No changes to the medications were made.

## 2014-11-01 NOTE — Assessment & Plan Note (Signed)
Diabetes number well-controlled. She is relatively thin, has a good diet

## 2014-11-01 NOTE — Patient Instructions (Signed)
You are doing well. No medication changes were made.  Please call us if you have new issues that need to be addressed before your next appt.  Your physician wants you to follow-up in: 6 months.  You will receive a reminder letter in the mail two months in advance. If you don't receive a letter, please call our office to schedule the follow-up appointment.   

## 2014-11-01 NOTE — Assessment & Plan Note (Signed)
Suggested repeat echocardiogram to evaluate her valve on her next clinic visit

## 2014-11-01 NOTE — Progress Notes (Signed)
Patient ID: [name], female    DOB: [birth year], 81 y.o.   MRN: [id number]  HPI Comments: [name] is a very pleasant 81 year old woman with history of CAD, bypass surgery x3 vessel in 2009 with bioprosthetic aortic valve placed at that time for severe aortic valve stenosis, diet controlled diabetes, previous smoking, moderate bilateral carotid disease, end-stage renal disease on hemodialysis 3 days per week for the past 2 years who presents for routine followup of her  Coronary artery disease  In follow-up she reports that she is doing well. Denies any chest pain, continues to work with heart track. Very independent Denies any palpitations concerning for arrhythmia Reports having low blood pressure during hemodialysis One out of every 2 times, has cramping, low blood pressure symptoms  EKG on today's visit shows normal sinus rhythm with rate 69 bpm, left anterior fascicular block, prolonged QTC  Other past medical history History of incontinence Total cholesterol 158, hemoglobin A1c 5.9  hemodialysis on March 18 and had syncope. She was taken to the hospital and diagnosed with urinary tract infection with encephalopathy. She was given antibiotics with improvement of her symptoms. Troponin 0.37 and 0.41. This was felt to be secondary to demand ischemia.  Remote history of MRSA and C. Difficile in December 2014 Echocardiogram in late 2014, well-seated valve   Allergies  Allergen Reactions  . Nitrofurantoin Itching  . Captopril Other (See Comments)    REACTION: unspecified  . Enalapril Maleate Cough  . Ramipril Other (See Comments)    REACTION: unspecified  . Sulfa Antibiotics Other (See Comments)    Reaction unknown  . Verapamil Other (See Comments)    REACTION: unspecified    Outpatient Encounter Prescriptions as of 11/01/2014  Medication Sig  . aspirin EC 81 MG tablet Take 81 mg by mouth at bedtime.  . calcium carbonate (TUMS - DOSED IN MG ELEMENTAL CALCIUM) 500 MG  chewable tablet Chew 1 tablet (200 mg of elemental calcium total) by mouth 3 (three) times daily with meals.  . folic acid (FOLVITE) 0.5 MG tablet Take 0.5 tablets (0.5 mg total) by mouth daily.  Marland Kitchen levothyroxine (SYNTHROID, LEVOTHROID) 75 MCG tablet TAKE ONE TABLET BY MOUTH ONCE DAILY  . metoprolol (LOPRESSOR) 50 MG tablet Take 1 tablet (50 mg total) by mouth 2 (two) times daily.  . pravastatin (PRAVACHOL) 20 MG tablet TAKE ONE TABLET BY MOUTH IN THE EVENING  . vitamin C (VITAMIN C) 500 MG tablet Take 1 tablet (500 mg total) by mouth daily.   No facility-administered encounter medications on file as of 11/01/2014.    Past Medical History  Diagnosis Date  . Anemia     NOS  . Personal history of colonic polyps   . GERD (gastroesophageal reflux disease)   . Hyperlipidemia     takes Simvastatin nightly  . Osteoarthritis   . Osteopenia   . Urinary incontinence   . Pulmonary hypertension ([place])   . Anxiety   . Renal insufficiency   . Coronary artery disease   . Aortic stenosis   . Hyperlipidemia   . Hypertension     takes Metoprolol daily  . Peripheral vascular disease ([place])   . CHF (congestive heart failure) ([place])   . Migraine     hx of  . Constipation   . Diarrhea   . Oligouria   . History of blood transfusion   . Diabetes mellitus     type II;was on Glipizide but has been off x 77mon  . Hypothyroidism  takes Synthroid daily  . Cancer [facility])     Mole on top of head to be removed in July 2013  . UTI (urinary tract infection)     Past Surgical History  Procedure Laterality Date  . Abdominal hysterectomy    . Tonsillectomy    . Cardiac catheterization  2000    cad  . Replacement total knee bilateral  05/1998  . Cataract extraction    . Coronary artery bypass graft      od  . Adenosine myoview  2007    benign, EF 69%  . Carotid endarterectomy  2011    Right  . Coronary artery bypass graft  2009  . Aortic valve replacement  2009  . Amputation-left great toe  7/12     Dr Marlou Sa  . Traumatic amputation of right dip joint of index finger    . Insertion of dialysis catheter  12/18/2010    Procedure: INSERTION OF DIALYSIS CATHETER;  Surgeon: Mal Misty, MD;  Location: [place];  Service: Vascular;  Laterality: Right;  . Av fistula placement  12/22/2010    Procedure: ARTERIOVENOUS (AV) FISTULA CREATION;  Surgeon: Hinda Lenis, MD;  Location: [place];  Service: Vascular;  Laterality: Left;  Creation of Left Brachial-Cephalic Fistula  . Appendectomy    . Eye surgery      BIlateral  . Amputation  06/18/2011    Procedure: AMPUTATION DIGIT;  Surgeon: Newt Minion, MD;  Location: [place];  Service: Orthopedics;  Laterality: Left;  Left 2nd Toe Amputation at MTP Joint    Social History  reports that she quit smoking about 71 years ago. Her smoking use included Cigarettes. She quit after 15 years of use. She has never used smokeless tobacco. She reports that she drinks alcohol. She reports that she does not use illicit drugs.  Family History Family history is unknown by patient.   Review of Systems  Constitutional: Negative.   HENT:       Funny sound in her ears, sounding like a helicopter  Respiratory: Negative.   Cardiovascular: Negative.   Gastrointestinal: Negative.   Musculoskeletal: Positive for gait problem.  Skin: Negative.   Neurological: Negative.   Hematological: Negative.   Psychiatric/Behavioral: Negative.   All other systems reviewed and are negative.   BP 144/70 mmHg  Pulse 69  Ht 5\' 2"  (1.575 m)  Wt 113 lb (51.256 kg)  BMI 20.66 kg/m2  Physical Exam  Constitutional: She is oriented to person, place, and time. She appears well-developed and well-nourished.  HENT:  Head: Normocephalic.  Nose: Nose normal.  Mouth/Throat: Oropharynx is clear and moist.  Eyes: Conjunctivae are normal. Pupils are equal, round, and reactive to light.  Neck: Normal range of motion. Neck supple. No JVD present.  Cardiovascular: Normal rate, regular  rhythm, S1 normal, S2 normal and intact distal pulses.  Exam reveals no gallop and no friction rub.   Murmur heard.  Systolic murmur is present with a grade of 2/6  Pulmonary/Chest: Effort normal and breath sounds normal. No respiratory distress. She has no wheezes. She has no rales. She exhibits no tenderness.  Abdominal: Soft. Bowel sounds are normal. She exhibits no distension. There is no tenderness.  Musculoskeletal: Normal range of motion. She exhibits no edema or tenderness.  Lymphadenopathy:    She has no cervical adenopathy.  Neurological: She is alert and oriented to person, place, and time. Coordination normal.  Skin: Skin is warm and dry. No rash noted. No erythema.  Psychiatric: She has a normal mood and affect. Her behavior is normal. Judgment and thought content normal.    Assessment and Plan  Nursing note and vitals reviewed.

## 2014-11-01 NOTE — Assessment & Plan Note (Signed)
Suspect her dry weight may be low She reports having hypotension during dialysis

## 2014-11-01 NOTE — Assessment & Plan Note (Signed)
Currently without any anginal symptoms. No further workup at this time

## 2014-11-01 NOTE — Assessment & Plan Note (Signed)
Blood pressure is well controlled on today's visit. No changes made to the medications. Low blood pressure with hemodialysis. Suspect her dry weight may be too low Recommended she bring this up with her her kidney physician

## 2014-11-01 NOTE — Assessment & Plan Note (Signed)
Currently with no symptoms of angina. No further workup at this time. Continue current medication regimen. 

## 2014-11-02 DIAGNOSIS — N186 End stage renal disease: Secondary | ICD-10-CM | POA: Diagnosis not present

## 2014-11-02 DIAGNOSIS — D509 Iron deficiency anemia, unspecified: Secondary | ICD-10-CM | POA: Diagnosis not present

## 2014-11-02 DIAGNOSIS — D631 Anemia in chronic kidney disease: Secondary | ICD-10-CM | POA: Diagnosis not present

## 2014-11-05 DIAGNOSIS — N186 End stage renal disease: Secondary | ICD-10-CM | POA: Diagnosis not present

## 2014-11-05 DIAGNOSIS — D509 Iron deficiency anemia, unspecified: Secondary | ICD-10-CM | POA: Diagnosis not present

## 2014-11-05 DIAGNOSIS — I120 Hypertensive chronic kidney disease with stage 5 chronic kidney disease or end stage renal disease: Secondary | ICD-10-CM | POA: Diagnosis not present

## 2014-11-05 DIAGNOSIS — D631 Anemia in chronic kidney disease: Secondary | ICD-10-CM | POA: Diagnosis not present

## 2014-11-05 DIAGNOSIS — Z992 Dependence on renal dialysis: Secondary | ICD-10-CM | POA: Diagnosis not present

## 2014-11-07 DIAGNOSIS — N186 End stage renal disease: Secondary | ICD-10-CM | POA: Diagnosis not present

## 2014-11-07 DIAGNOSIS — D631 Anemia in chronic kidney disease: Secondary | ICD-10-CM | POA: Diagnosis not present

## 2014-11-07 DIAGNOSIS — E875 Hyperkalemia: Secondary | ICD-10-CM | POA: Diagnosis not present

## 2014-11-07 DIAGNOSIS — D509 Iron deficiency anemia, unspecified: Secondary | ICD-10-CM | POA: Diagnosis not present

## 2014-11-08 ENCOUNTER — Ambulatory Visit (INDEPENDENT_AMBULATORY_CARE_PROVIDER_SITE_OTHER): Payer: Medicare Other

## 2014-11-08 ENCOUNTER — Ambulatory Visit: Payer: Medicare Other | Admitting: Internal Medicine

## 2014-11-08 DIAGNOSIS — Z23 Encounter for immunization: Secondary | ICD-10-CM

## 2014-11-09 DIAGNOSIS — N186 End stage renal disease: Secondary | ICD-10-CM | POA: Diagnosis not present

## 2014-11-09 DIAGNOSIS — E875 Hyperkalemia: Secondary | ICD-10-CM | POA: Diagnosis not present

## 2014-11-09 DIAGNOSIS — D509 Iron deficiency anemia, unspecified: Secondary | ICD-10-CM | POA: Diagnosis not present

## 2014-11-09 DIAGNOSIS — D631 Anemia in chronic kidney disease: Secondary | ICD-10-CM | POA: Diagnosis not present

## 2014-11-12 DIAGNOSIS — E875 Hyperkalemia: Secondary | ICD-10-CM | POA: Diagnosis not present

## 2014-11-12 DIAGNOSIS — N186 End stage renal disease: Secondary | ICD-10-CM | POA: Diagnosis not present

## 2014-11-12 DIAGNOSIS — D509 Iron deficiency anemia, unspecified: Secondary | ICD-10-CM | POA: Diagnosis not present

## 2014-11-12 DIAGNOSIS — D631 Anemia in chronic kidney disease: Secondary | ICD-10-CM | POA: Diagnosis not present

## 2014-11-14 DIAGNOSIS — D631 Anemia in chronic kidney disease: Secondary | ICD-10-CM | POA: Diagnosis not present

## 2014-11-14 DIAGNOSIS — N186 End stage renal disease: Secondary | ICD-10-CM | POA: Diagnosis not present

## 2014-11-14 DIAGNOSIS — D509 Iron deficiency anemia, unspecified: Secondary | ICD-10-CM | POA: Diagnosis not present

## 2014-11-14 DIAGNOSIS — E875 Hyperkalemia: Secondary | ICD-10-CM | POA: Diagnosis not present

## 2014-11-16 DIAGNOSIS — D509 Iron deficiency anemia, unspecified: Secondary | ICD-10-CM | POA: Diagnosis not present

## 2014-11-16 DIAGNOSIS — E875 Hyperkalemia: Secondary | ICD-10-CM | POA: Diagnosis not present

## 2014-11-16 DIAGNOSIS — N186 End stage renal disease: Secondary | ICD-10-CM | POA: Diagnosis not present

## 2014-11-16 DIAGNOSIS — D631 Anemia in chronic kidney disease: Secondary | ICD-10-CM | POA: Diagnosis not present

## 2014-11-19 DIAGNOSIS — D509 Iron deficiency anemia, unspecified: Secondary | ICD-10-CM | POA: Diagnosis not present

## 2014-11-19 DIAGNOSIS — E875 Hyperkalemia: Secondary | ICD-10-CM | POA: Diagnosis not present

## 2014-11-19 DIAGNOSIS — D631 Anemia in chronic kidney disease: Secondary | ICD-10-CM | POA: Diagnosis not present

## 2014-11-19 DIAGNOSIS — N186 End stage renal disease: Secondary | ICD-10-CM | POA: Diagnosis not present

## 2014-11-20 ENCOUNTER — Encounter: Payer: Self-pay | Admitting: Internal Medicine

## 2014-11-20 ENCOUNTER — Ambulatory Visit (INDEPENDENT_AMBULATORY_CARE_PROVIDER_SITE_OTHER): Payer: Medicare Other | Admitting: Internal Medicine

## 2014-11-20 VITALS — BP 150/60 | HR 69 | Temp 98.3°F | Wt 115.0 lb

## 2014-11-20 DIAGNOSIS — Z992 Dependence on renal dialysis: Secondary | ICD-10-CM

## 2014-11-20 DIAGNOSIS — I739 Peripheral vascular disease, unspecified: Secondary | ICD-10-CM

## 2014-11-20 DIAGNOSIS — I272 Other secondary pulmonary hypertension: Secondary | ICD-10-CM

## 2014-11-20 DIAGNOSIS — E1122 Type 2 diabetes mellitus with diabetic chronic kidney disease: Secondary | ICD-10-CM | POA: Diagnosis not present

## 2014-11-20 DIAGNOSIS — N186 End stage renal disease: Secondary | ICD-10-CM

## 2014-11-20 LAB — HEMOGLOBIN A1C: Hgb A1c MFr Bld: 6.2 % (ref 4.6–6.5)

## 2014-11-20 NOTE — Assessment & Plan Note (Signed)
Stable fluid status on dialysis No DOE with her walking regimen

## 2014-11-20 NOTE — Progress Notes (Signed)
Subjective:    Patient ID: [name], female    DOB: 1920-01-12, 81 y.o.   MRN: [id number]  HPI Here for follow up of diabetes and other chronic medical conditions She feels well  Continues on dialysis Some days "are a good one, others...." Going back to vascular surgeon--bleeding a lot with dialysis  Doesn't check sugars They do check them at dialysis but not fasting No foot sores or pain  Has soreness in both thighs when at heart track Not necessarily exertional  No chest pain No palpitations Still gets the flashing lights at times--- but no dizziness No edema  Current Outpatient Prescriptions on File Prior to Visit  Medication Sig Dispense Refill  . aspirin EC 81 MG tablet Take 81 mg by mouth at bedtime.    . calcium carbonate (TUMS - DOSED IN MG ELEMENTAL CALCIUM) 500 MG chewable tablet Chew 1 tablet (200 mg of elemental calcium total) by mouth 3 (three) times daily with meals.    . folic acid (FOLVITE) 0.5 MG tablet Take 0.5 tablets (0.5 mg total) by mouth daily.    Marland Kitchen levothyroxine (SYNTHROID, LEVOTHROID) 75 MCG tablet TAKE ONE TABLET BY MOUTH ONCE DAILY 30 tablet 11  . metoprolol (LOPRESSOR) 50 MG tablet Take 1 tablet (50 mg total) by mouth 2 (two) times daily. 180 tablet 3  . pravastatin (PRAVACHOL) 20 MG tablet TAKE ONE TABLET BY MOUTH IN THE EVENING 90 tablet 3  . vitamin C (VITAMIN C) 500 MG tablet Take 1 tablet (500 mg total) by mouth daily.     No current facility-administered medications on file prior to visit.    Allergies  Allergen Reactions  . Nitrofurantoin Itching  . Captopril Other (See Comments)    REACTION: unspecified  . Enalapril Maleate Cough  . Ramipril Other (See Comments)    REACTION: unspecified  . Sulfa Antibiotics Other (See Comments)    Reaction unknown  . Verapamil Other (See Comments)    REACTION: unspecified    Past Medical History  Diagnosis Date  . Anemia     NOS  . Personal history of colonic polyps   . GERD  (gastroesophageal reflux disease)   . Hyperlipidemia     takes Simvastatin nightly  . Osteoarthritis   . Osteopenia   . Urinary incontinence   . Pulmonary hypertension ([place])   . Anxiety   . Renal insufficiency   . Coronary artery disease   . Aortic stenosis   . Hyperlipidemia   . Hypertension     takes Metoprolol daily  . Peripheral vascular disease ([place])   . CHF (congestive heart failure) ([place])   . Migraine     hx of  . Constipation   . Diarrhea   . Oligouria   . History of blood transfusion   . Diabetes mellitus     type II;was on Glipizide but has been off x 11mon  . Hypothyroidism     takes Synthroid daily  . Cancer [facility])     Mole on top of head to be removed in July 2013  . UTI (urinary tract infection)     Past Surgical History  Procedure Laterality Date  . Abdominal hysterectomy    . Tonsillectomy    . Cardiac catheterization  2000    cad  . Replacement total knee bilateral  05/1998  . Cataract extraction    . Coronary artery bypass graft      od  . Adenosine myoview  2007    benign,  EF 69%  . Carotid endarterectomy  2011    Right  . Coronary artery bypass graft  2009  . Aortic valve replacement  2009  . Amputation-left great toe  7/12    Dr Marlou Sa  . Traumatic amputation of right dip joint of index finger    . Insertion of dialysis catheter  12/18/2010    Procedure: INSERTION OF DIALYSIS CATHETER;  Surgeon: Mal Misty, MD;  Location: [place];  Service: Vascular;  Laterality: Right;  . Av fistula placement  12/22/2010    Procedure: ARTERIOVENOUS (AV) FISTULA CREATION;  Surgeon: Hinda Lenis, MD;  Location: [place];  Service: Vascular;  Laterality: Left;  Creation of Left Brachial-Cephalic Fistula  . Appendectomy    . Eye surgery      BIlateral  . Amputation  06/18/2011    Procedure: AMPUTATION DIGIT;  Surgeon: Newt Minion, MD;  Location: [place];  Service: Orthopedics;  Laterality: Left;  Left 2nd Toe Amputation at MTP Joint    Family History    Problem Relation Age of Onset  . Family history unknown: Yes    Social History   Social History  . Marital Status: Married    Spouse Name: [name]  . Number of Children: 1  . Years of Education: [name]   Occupational History  . 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 Smoking status: Former Smoker -- 15 years    Types: Cigarettes    Quit date: 01/06/1943  . Smokeless tobacco: Never Used  . Alcohol Use: Yes     Comment: Wine occasionally  . Drug Use: No  . Sexual Activity: No   Other Topics Concern  . Not on file   Social History Narrative   [name] to make decisions for her   Discussed DNR and she requests --done 05/03/14   No tube feeds if cognitively unaware   Review of Systems Has rough spot on right temple she wants checked Cancer removed from nose not long ago---[facility] Sleeps well usually Appetite is pretty good Weight up slightly    Objective:   Physical Exam  Constitutional: She appears well-developed. No distress.  Neck: Normal range of motion. No thyromegaly present.  Cardiovascular: Normal rate and regular rhythm.   Faint pedal pulses Gr 3/6 MR murmur  Pulmonary/Chest: Effort normal and breath sounds normal. No respiratory distress. She has no wheezes. She has no rales.  Musculoskeletal:  Mild bunion on right Left great toe amputation site clean  Lymphadenopathy:    She has no cervical adenopathy.  Skin:  No foot ulcers  Psychiatric: She has a normal mood and affect. Her behavior is normal.          Assessment & Plan:

## 2014-11-20 NOTE — Assessment & Plan Note (Signed)
Seems to be okay No meds Not checking (except non fasting at dialysis) Due for A1c

## 2014-11-20 NOTE — Assessment & Plan Note (Signed)
Has some thigh pain but not clearly claudication No changes needed

## 2014-11-20 NOTE — Progress Notes (Signed)
Pre visit review using our clinic review tool, if applicable. No additional management support is needed unless otherwise documented below in the visit note. 

## 2014-11-20 NOTE — Assessment & Plan Note (Signed)
Doing well Tolerating dialysis well by her report

## 2014-11-21 ENCOUNTER — Encounter: Payer: Self-pay | Admitting: *Deleted

## 2014-11-21 DIAGNOSIS — D509 Iron deficiency anemia, unspecified: Secondary | ICD-10-CM | POA: Diagnosis not present

## 2014-11-21 DIAGNOSIS — D631 Anemia in chronic kidney disease: Secondary | ICD-10-CM | POA: Diagnosis not present

## 2014-11-21 DIAGNOSIS — E875 Hyperkalemia: Secondary | ICD-10-CM | POA: Diagnosis not present

## 2014-11-21 DIAGNOSIS — N186 End stage renal disease: Secondary | ICD-10-CM | POA: Diagnosis not present

## 2014-11-22 DIAGNOSIS — T82858D Stenosis of vascular prosthetic devices, implants and grafts, subsequent encounter: Secondary | ICD-10-CM | POA: Diagnosis not present

## 2014-11-22 DIAGNOSIS — I871 Compression of vein: Secondary | ICD-10-CM | POA: Diagnosis not present

## 2014-11-22 DIAGNOSIS — N186 End stage renal disease: Secondary | ICD-10-CM | POA: Diagnosis not present

## 2014-11-22 DIAGNOSIS — Z992 Dependence on renal dialysis: Secondary | ICD-10-CM | POA: Diagnosis not present

## 2014-11-23 DIAGNOSIS — D509 Iron deficiency anemia, unspecified: Secondary | ICD-10-CM | POA: Diagnosis not present

## 2014-11-23 DIAGNOSIS — D631 Anemia in chronic kidney disease: Secondary | ICD-10-CM | POA: Diagnosis not present

## 2014-11-23 DIAGNOSIS — N186 End stage renal disease: Secondary | ICD-10-CM | POA: Diagnosis not present

## 2014-11-23 DIAGNOSIS — E875 Hyperkalemia: Secondary | ICD-10-CM | POA: Diagnosis not present

## 2014-11-26 DIAGNOSIS — D509 Iron deficiency anemia, unspecified: Secondary | ICD-10-CM | POA: Diagnosis not present

## 2014-11-26 DIAGNOSIS — E875 Hyperkalemia: Secondary | ICD-10-CM | POA: Diagnosis not present

## 2014-11-26 DIAGNOSIS — D631 Anemia in chronic kidney disease: Secondary | ICD-10-CM | POA: Diagnosis not present

## 2014-11-26 DIAGNOSIS — N186 End stage renal disease: Secondary | ICD-10-CM | POA: Diagnosis not present

## 2014-11-28 DIAGNOSIS — D631 Anemia in chronic kidney disease: Secondary | ICD-10-CM | POA: Diagnosis not present

## 2014-11-28 DIAGNOSIS — D509 Iron deficiency anemia, unspecified: Secondary | ICD-10-CM | POA: Diagnosis not present

## 2014-11-28 DIAGNOSIS — N186 End stage renal disease: Secondary | ICD-10-CM | POA: Diagnosis not present

## 2014-11-28 DIAGNOSIS — E875 Hyperkalemia: Secondary | ICD-10-CM | POA: Diagnosis not present

## 2014-11-30 DIAGNOSIS — N186 End stage renal disease: Secondary | ICD-10-CM | POA: Diagnosis not present

## 2014-11-30 DIAGNOSIS — D509 Iron deficiency anemia, unspecified: Secondary | ICD-10-CM | POA: Diagnosis not present

## 2014-11-30 DIAGNOSIS — D631 Anemia in chronic kidney disease: Secondary | ICD-10-CM | POA: Diagnosis not present

## 2014-11-30 DIAGNOSIS — E875 Hyperkalemia: Secondary | ICD-10-CM | POA: Diagnosis not present

## 2014-12-03 DIAGNOSIS — N186 End stage renal disease: Secondary | ICD-10-CM | POA: Diagnosis not present

## 2014-12-03 DIAGNOSIS — D631 Anemia in chronic kidney disease: Secondary | ICD-10-CM | POA: Diagnosis not present

## 2014-12-03 DIAGNOSIS — E875 Hyperkalemia: Secondary | ICD-10-CM | POA: Diagnosis not present

## 2014-12-03 DIAGNOSIS — D509 Iron deficiency anemia, unspecified: Secondary | ICD-10-CM | POA: Diagnosis not present

## 2014-12-05 DIAGNOSIS — Z992 Dependence on renal dialysis: Secondary | ICD-10-CM | POA: Diagnosis not present

## 2014-12-05 DIAGNOSIS — I120 Hypertensive chronic kidney disease with stage 5 chronic kidney disease or end stage renal disease: Secondary | ICD-10-CM | POA: Diagnosis not present

## 2014-12-05 DIAGNOSIS — N186 End stage renal disease: Secondary | ICD-10-CM | POA: Diagnosis not present

## 2014-12-05 DIAGNOSIS — D631 Anemia in chronic kidney disease: Secondary | ICD-10-CM | POA: Diagnosis not present

## 2014-12-05 DIAGNOSIS — D509 Iron deficiency anemia, unspecified: Secondary | ICD-10-CM | POA: Diagnosis not present

## 2014-12-05 DIAGNOSIS — E875 Hyperkalemia: Secondary | ICD-10-CM | POA: Diagnosis not present

## 2014-12-07 DIAGNOSIS — D631 Anemia in chronic kidney disease: Secondary | ICD-10-CM | POA: Diagnosis not present

## 2014-12-07 DIAGNOSIS — E119 Type 2 diabetes mellitus without complications: Secondary | ICD-10-CM | POA: Diagnosis not present

## 2014-12-07 DIAGNOSIS — N186 End stage renal disease: Secondary | ICD-10-CM | POA: Diagnosis not present

## 2014-12-07 DIAGNOSIS — E875 Hyperkalemia: Secondary | ICD-10-CM | POA: Diagnosis not present

## 2014-12-10 DIAGNOSIS — E119 Type 2 diabetes mellitus without complications: Secondary | ICD-10-CM | POA: Diagnosis not present

## 2014-12-10 DIAGNOSIS — E875 Hyperkalemia: Secondary | ICD-10-CM | POA: Diagnosis not present

## 2014-12-10 DIAGNOSIS — N186 End stage renal disease: Secondary | ICD-10-CM | POA: Diagnosis not present

## 2014-12-10 DIAGNOSIS — D631 Anemia in chronic kidney disease: Secondary | ICD-10-CM | POA: Diagnosis not present

## 2014-12-11 DIAGNOSIS — Z961 Presence of intraocular lens: Secondary | ICD-10-CM | POA: Diagnosis not present

## 2014-12-12 DIAGNOSIS — N186 End stage renal disease: Secondary | ICD-10-CM | POA: Diagnosis not present

## 2014-12-12 DIAGNOSIS — D631 Anemia in chronic kidney disease: Secondary | ICD-10-CM | POA: Diagnosis not present

## 2014-12-12 DIAGNOSIS — E119 Type 2 diabetes mellitus without complications: Secondary | ICD-10-CM | POA: Diagnosis not present

## 2014-12-12 DIAGNOSIS — E875 Hyperkalemia: Secondary | ICD-10-CM | POA: Diagnosis not present

## 2014-12-15 DIAGNOSIS — E119 Type 2 diabetes mellitus without complications: Secondary | ICD-10-CM | POA: Diagnosis not present

## 2014-12-15 DIAGNOSIS — E875 Hyperkalemia: Secondary | ICD-10-CM | POA: Diagnosis not present

## 2014-12-15 DIAGNOSIS — N186 End stage renal disease: Secondary | ICD-10-CM | POA: Diagnosis not present

## 2014-12-15 DIAGNOSIS — D631 Anemia in chronic kidney disease: Secondary | ICD-10-CM | POA: Diagnosis not present

## 2014-12-17 DIAGNOSIS — E119 Type 2 diabetes mellitus without complications: Secondary | ICD-10-CM | POA: Diagnosis not present

## 2014-12-17 DIAGNOSIS — N186 End stage renal disease: Secondary | ICD-10-CM | POA: Diagnosis not present

## 2014-12-17 DIAGNOSIS — E875 Hyperkalemia: Secondary | ICD-10-CM | POA: Diagnosis not present

## 2014-12-17 DIAGNOSIS — D631 Anemia in chronic kidney disease: Secondary | ICD-10-CM | POA: Diagnosis not present

## 2014-12-19 DIAGNOSIS — E875 Hyperkalemia: Secondary | ICD-10-CM | POA: Diagnosis not present

## 2014-12-19 DIAGNOSIS — D631 Anemia in chronic kidney disease: Secondary | ICD-10-CM | POA: Diagnosis not present

## 2014-12-19 DIAGNOSIS — E119 Type 2 diabetes mellitus without complications: Secondary | ICD-10-CM | POA: Diagnosis not present

## 2014-12-19 DIAGNOSIS — N186 End stage renal disease: Secondary | ICD-10-CM | POA: Diagnosis not present

## 2014-12-21 DIAGNOSIS — E875 Hyperkalemia: Secondary | ICD-10-CM | POA: Diagnosis not present

## 2014-12-21 DIAGNOSIS — D631 Anemia in chronic kidney disease: Secondary | ICD-10-CM | POA: Diagnosis not present

## 2014-12-21 DIAGNOSIS — N186 End stage renal disease: Secondary | ICD-10-CM | POA: Diagnosis not present

## 2014-12-21 DIAGNOSIS — E119 Type 2 diabetes mellitus without complications: Secondary | ICD-10-CM | POA: Diagnosis not present

## 2014-12-24 DIAGNOSIS — E875 Hyperkalemia: Secondary | ICD-10-CM | POA: Diagnosis not present

## 2014-12-24 DIAGNOSIS — E119 Type 2 diabetes mellitus without complications: Secondary | ICD-10-CM | POA: Diagnosis not present

## 2014-12-24 DIAGNOSIS — D631 Anemia in chronic kidney disease: Secondary | ICD-10-CM | POA: Diagnosis not present

## 2014-12-24 DIAGNOSIS — N186 End stage renal disease: Secondary | ICD-10-CM | POA: Diagnosis not present

## 2014-12-26 DIAGNOSIS — D631 Anemia in chronic kidney disease: Secondary | ICD-10-CM | POA: Diagnosis not present

## 2014-12-26 DIAGNOSIS — E119 Type 2 diabetes mellitus without complications: Secondary | ICD-10-CM | POA: Diagnosis not present

## 2014-12-26 DIAGNOSIS — E875 Hyperkalemia: Secondary | ICD-10-CM | POA: Diagnosis not present

## 2014-12-26 DIAGNOSIS — N186 End stage renal disease: Secondary | ICD-10-CM | POA: Diagnosis not present

## 2014-12-28 DIAGNOSIS — E875 Hyperkalemia: Secondary | ICD-10-CM | POA: Diagnosis not present

## 2014-12-28 DIAGNOSIS — D631 Anemia in chronic kidney disease: Secondary | ICD-10-CM | POA: Diagnosis not present

## 2014-12-28 DIAGNOSIS — N186 End stage renal disease: Secondary | ICD-10-CM | POA: Diagnosis not present

## 2014-12-28 DIAGNOSIS — E119 Type 2 diabetes mellitus without complications: Secondary | ICD-10-CM | POA: Diagnosis not present

## 2014-12-31 DIAGNOSIS — N186 End stage renal disease: Secondary | ICD-10-CM | POA: Diagnosis not present

## 2014-12-31 DIAGNOSIS — D631 Anemia in chronic kidney disease: Secondary | ICD-10-CM | POA: Diagnosis not present

## 2014-12-31 DIAGNOSIS — E119 Type 2 diabetes mellitus without complications: Secondary | ICD-10-CM | POA: Diagnosis not present

## 2014-12-31 DIAGNOSIS — E875 Hyperkalemia: Secondary | ICD-10-CM | POA: Diagnosis not present

## 2015-01-02 DIAGNOSIS — D631 Anemia in chronic kidney disease: Secondary | ICD-10-CM | POA: Diagnosis not present

## 2015-01-02 DIAGNOSIS — N186 End stage renal disease: Secondary | ICD-10-CM | POA: Diagnosis not present

## 2015-01-02 DIAGNOSIS — E119 Type 2 diabetes mellitus without complications: Secondary | ICD-10-CM | POA: Diagnosis not present

## 2015-01-02 DIAGNOSIS — E875 Hyperkalemia: Secondary | ICD-10-CM | POA: Diagnosis not present

## 2015-01-04 DIAGNOSIS — D631 Anemia in chronic kidney disease: Secondary | ICD-10-CM | POA: Diagnosis not present

## 2015-01-04 DIAGNOSIS — N186 End stage renal disease: Secondary | ICD-10-CM | POA: Diagnosis not present

## 2015-01-04 DIAGNOSIS — E875 Hyperkalemia: Secondary | ICD-10-CM | POA: Diagnosis not present

## 2015-01-04 DIAGNOSIS — E119 Type 2 diabetes mellitus without complications: Secondary | ICD-10-CM | POA: Diagnosis not present

## 2015-01-05 DIAGNOSIS — N186 End stage renal disease: Secondary | ICD-10-CM | POA: Diagnosis not present

## 2015-01-05 DIAGNOSIS — I120 Hypertensive chronic kidney disease with stage 5 chronic kidney disease or end stage renal disease: Secondary | ICD-10-CM | POA: Diagnosis not present

## 2015-01-05 DIAGNOSIS — Z992 Dependence on renal dialysis: Secondary | ICD-10-CM | POA: Diagnosis not present

## 2015-01-07 DIAGNOSIS — N186 End stage renal disease: Secondary | ICD-10-CM | POA: Diagnosis not present

## 2015-01-07 DIAGNOSIS — D631 Anemia in chronic kidney disease: Secondary | ICD-10-CM | POA: Diagnosis not present

## 2015-01-07 DIAGNOSIS — E875 Hyperkalemia: Secondary | ICD-10-CM | POA: Diagnosis not present

## 2015-01-08 DIAGNOSIS — C44329 Squamous cell carcinoma of skin of other parts of face: Secondary | ICD-10-CM | POA: Diagnosis not present

## 2015-01-08 DIAGNOSIS — Z85828 Personal history of other malignant neoplasm of skin: Secondary | ICD-10-CM | POA: Diagnosis not present

## 2015-01-08 DIAGNOSIS — D485 Neoplasm of uncertain behavior of skin: Secondary | ICD-10-CM | POA: Diagnosis not present

## 2015-01-08 DIAGNOSIS — L570 Actinic keratosis: Secondary | ICD-10-CM | POA: Diagnosis not present

## 2015-01-09 ENCOUNTER — Other Ambulatory Visit: Payer: Self-pay | Admitting: Cardiovascular Disease

## 2015-01-09 DIAGNOSIS — E875 Hyperkalemia: Secondary | ICD-10-CM | POA: Diagnosis not present

## 2015-01-09 DIAGNOSIS — N186 End stage renal disease: Secondary | ICD-10-CM | POA: Diagnosis not present

## 2015-01-09 DIAGNOSIS — D631 Anemia in chronic kidney disease: Secondary | ICD-10-CM | POA: Diagnosis not present

## 2015-01-11 DIAGNOSIS — D631 Anemia in chronic kidney disease: Secondary | ICD-10-CM | POA: Diagnosis not present

## 2015-01-11 DIAGNOSIS — N186 End stage renal disease: Secondary | ICD-10-CM | POA: Diagnosis not present

## 2015-01-11 DIAGNOSIS — E875 Hyperkalemia: Secondary | ICD-10-CM | POA: Diagnosis not present

## 2015-01-14 DIAGNOSIS — E875 Hyperkalemia: Secondary | ICD-10-CM | POA: Diagnosis not present

## 2015-01-14 DIAGNOSIS — D631 Anemia in chronic kidney disease: Secondary | ICD-10-CM | POA: Diagnosis not present

## 2015-01-14 DIAGNOSIS — N186 End stage renal disease: Secondary | ICD-10-CM | POA: Diagnosis not present

## 2015-01-16 DIAGNOSIS — E875 Hyperkalemia: Secondary | ICD-10-CM | POA: Diagnosis not present

## 2015-01-16 DIAGNOSIS — N186 End stage renal disease: Secondary | ICD-10-CM | POA: Diagnosis not present

## 2015-01-16 DIAGNOSIS — D631 Anemia in chronic kidney disease: Secondary | ICD-10-CM | POA: Diagnosis not present

## 2015-01-18 DIAGNOSIS — N186 End stage renal disease: Secondary | ICD-10-CM | POA: Diagnosis not present

## 2015-01-18 DIAGNOSIS — E875 Hyperkalemia: Secondary | ICD-10-CM | POA: Diagnosis not present

## 2015-01-18 DIAGNOSIS — D631 Anemia in chronic kidney disease: Secondary | ICD-10-CM | POA: Diagnosis not present

## 2015-01-21 DIAGNOSIS — E875 Hyperkalemia: Secondary | ICD-10-CM | POA: Diagnosis not present

## 2015-01-21 DIAGNOSIS — D631 Anemia in chronic kidney disease: Secondary | ICD-10-CM | POA: Diagnosis not present

## 2015-01-21 DIAGNOSIS — N186 End stage renal disease: Secondary | ICD-10-CM | POA: Diagnosis not present

## 2015-01-22 ENCOUNTER — Ambulatory Visit (INDEPENDENT_AMBULATORY_CARE_PROVIDER_SITE_OTHER): Payer: Medicare Other | Admitting: Primary Care

## 2015-01-22 ENCOUNTER — Encounter: Payer: Self-pay | Admitting: Primary Care

## 2015-01-22 VITALS — BP 134/62 | HR 66 | Temp 98.1°F | Ht 63.0 in | Wt 113.8 lb

## 2015-01-22 DIAGNOSIS — R059 Cough, unspecified: Secondary | ICD-10-CM

## 2015-01-22 DIAGNOSIS — R05 Cough: Secondary | ICD-10-CM

## 2015-01-22 MED ORDER — AMOXICILLIN 875 MG PO TABS: 875.0000 mg | ORAL_TABLET | Freq: Two times a day (BID) | ORAL | Status: DC

## 2015-01-22 NOTE — Patient Instructions (Signed)
Your symptoms are likely related to a virus which cannot be treated with antibiotics. Your symptoms should improve in the next 3-4 days.  If your symptoms have not improved then fill the Amoxicillin antibiotic. Take 1 tablet by mouth twice daily for 5 days.  Cough: Robitussin DM. This may be purchased over the counter.  Please ensure you stay hydrated with water to prevent dehydration.  It was a pleasure meeting you!  Upper Respiratory Infection, Adult Most upper respiratory infections (URIs) are a viral infection of the air passages leading to the lungs. A URI affects the nose, throat, and upper air passages. The most common type of URI is nasopharyngitis and is typically referred to as "the common cold." URIs run their course and usually go away on their own. Most of the time, a URI does not require medical attention, but sometimes a bacterial infection in the upper airways can follow a viral infection. This is called a secondary infection. Sinus and middle ear infections are common types of secondary upper respiratory infections. Bacterial pneumonia can also complicate a URI. A URI can worsen asthma and chronic obstructive pulmonary disease (COPD). Sometimes, these complications can require emergency medical care and may be life threatening.  CAUSES Almost all URIs are caused by viruses. A virus is a type of germ and can spread from one person to another.  RISKS FACTORS You may be at risk for a URI if:   You smoke.   You have chronic heart or lung disease.  You have a weakened defense (immune) system.   You are very young or very old.   You have nasal allergies or asthma.  You work in crowded or poorly ventilated areas.  You work in health care facilities or schools. SIGNS AND SYMPTOMS  Symptoms typically develop 2-3 days after you come in contact with a cold virus. Most viral URIs last 7-10 days. However, viral URIs from the influenza virus (flu virus) can last 14-18 days and  are typically more severe. Symptoms may include:   Runny or stuffy (congested) nose.   Sneezing.   Cough.   Sore throat.   Headache.   Fatigue.   Fever.   Loss of appetite.   Pain in your forehead, behind your eyes, and over your cheekbones (sinus pain).  Muscle aches.  DIAGNOSIS  Your health care provider may diagnose a URI by:  Physical exam.  Tests to check that your symptoms are not due to another condition such as:  Strep throat.  Sinusitis.  Pneumonia.  Asthma. TREATMENT  A URI goes away on its own with time. It cannot be cured with medicines, but medicines may be prescribed or recommended to relieve symptoms. Medicines may help:  Reduce your fever.  Reduce your cough.  Relieve nasal congestion. HOME CARE INSTRUCTIONS   Take medicines only as directed by your health care provider.   Gargle warm saltwater or take cough drops to comfort your throat as directed by your health care provider.  Use a warm mist humidifier or inhale steam from a shower to increase air moisture. This may make it easier to breathe.  Drink enough fluid to keep your urine clear or pale yellow.   Eat soups and other clear broths and maintain good nutrition.   Rest as needed.   Return to work when your temperature has returned to normal or as your health care provider advises. You may need to stay home longer to avoid infecting others. You can also use a face  mask and careful hand washing to prevent spread of the virus.  Increase the usage of your inhaler if you have asthma.   Do not use any tobacco products, including cigarettes, chewing tobacco, or electronic cigarettes. If you need help quitting, ask your health care provider. PREVENTION  The best way to protect yourself from getting a cold is to practice good hygiene.   Avoid oral or hand contact with people with cold symptoms.   Wash your hands often if contact occurs.  There is no clear evidence that  vitamin C, vitamin E, echinacea, or exercise reduces the chance of developing a cold. However, it is always recommended to get plenty of rest, exercise, and practice good nutrition.  SEEK MEDICAL CARE IF:   You are getting worse rather than better.   Your symptoms are not controlled by medicine.   You have chills.  You have worsening shortness of breath.  You have brown or red mucus.  You have yellow or brown nasal discharge.  You have pain in your face, especially when you bend forward.  You have a fever.  You have swollen neck glands.  You have pain while swallowing.  You have white areas in the back of your throat. SEEK IMMEDIATE MEDICAL CARE IF:   You have severe or persistent:  Headache.  Ear pain.  Sinus pain.  Chest pain.  You have chronic lung disease and any of the following:  Wheezing.  Prolonged cough.  Coughing up blood.  A change in your usual mucus.  You have a stiff neck.  You have changes in your:  Vision.  Hearing.  Thinking.  Mood. MAKE SURE YOU:   Understand these instructions.  Will watch your condition.  Will get help right away if you are not doing well or get worse.   This information is not intended to replace advice given to you by your health care provider. Make sure you discuss any questions you have with your health care provider.   Document Released: 06/17/2000 Document Revised: 05/08/2014 Document Reviewed: 03/29/2013 Elsevier Interactive Patient Education 2016 [employer]  

## 2015-01-22 NOTE — Progress Notes (Signed)
Pre visit review using our clinic review tool, if applicable. No additional management support is needed unless otherwise documented below in the visit note. 

## 2015-01-22 NOTE — Progress Notes (Signed)
Subjective:    Patient ID: [name], female    DOB: 08-07-1920, 82 y.o.   MRN: [id number]  HPI  [name] is a 82 year old female who presents today with chief complaint of cough. She also reports nasal congestion, weakness, sore throat, fatigue. Her symptoms have been present for the past 5 days. She has a history of renal failure and is a dialysis patient M,W,F. Her cough is productive with yellow sputum. Overall she's feeling slightly better than last week. Denies fevers, chills, nausea. She's not taken any medication OTC.     Review of Systems  Constitutional: Positive for fatigue. Negative for fever and chills.  HENT: Positive for congestion and sore throat.   Respiratory: Positive for cough.   Neurological: Positive for weakness.       Past Medical History  Diagnosis Date  . Anemia     NOS  . Personal history of colonic polyps   . GERD (gastroesophageal reflux disease)   . Hyperlipidemia     takes Simvastatin nightly  . Osteoarthritis   . Osteopenia   . Urinary incontinence   . Pulmonary hypertension ([place])   . Anxiety   . Renal insufficiency   . Coronary artery disease   . Aortic stenosis   . Hyperlipidemia   . Hypertension     takes Metoprolol daily  . Peripheral vascular disease ([place])   . CHF (congestive heart failure) ([place])   . Migraine     hx of  . Constipation   . Diarrhea   . Oligouria   . History of blood transfusion   . Diabetes mellitus     type II;was on Glipizide but has been off x 67mon  . Hypothyroidism     takes Synthroid daily  . Cancer [facility])     Mole on top of head to be removed in July 2013  . UTI (urinary tract infection)     Social History   Social History  . Marital Status: Married    Spouse Name: [name]  . Number of Children: 1  . Years of Education: [name]   Occupational History  . 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 Smoking status: Former Smoker -- 15 years    Types: Cigarettes    Quit  date: 01/06/1943  . Smokeless tobacco: Never Used  . Alcohol Use: Yes     Comment: Wine occasionally  . Drug Use: No  . Sexual Activity: No   Other Topics Concern  . Not on file   Social History Narrative   [name] to make decisions for her   Discussed DNR and she requests --done 05/03/14   No tube feeds if cognitively unaware    Past Surgical History  Procedure Laterality Date  . Abdominal hysterectomy    . Tonsillectomy    . Cardiac catheterization  2000    cad  . Replacement total knee bilateral  05/1998  . Cataract extraction    . Coronary artery bypass graft      od  . Adenosine myoview  2007    benign, EF 69%  . Carotid endarterectomy  2011    Right  . Coronary artery bypass graft  2009  . Aortic valve replacement  2009  . Amputation-left great toe  7/12    Dr Marlou Sa  . Traumatic amputation of right dip joint of index finger    . Insertion of dialysis catheter  12/18/2010  Procedure: INSERTION OF DIALYSIS CATHETER;  Surgeon: Mal Misty, MD;  Location: [place];  Service: Vascular;  Laterality: Right;  . Av fistula placement  12/22/2010    Procedure: ARTERIOVENOUS (AV) FISTULA CREATION;  Surgeon: Hinda Lenis, MD;  Location: [place];  Service: Vascular;  Laterality: Left;  Creation of Left Brachial-Cephalic Fistula  . Appendectomy    . Eye surgery      BIlateral  . Amputation  06/18/2011    Procedure: AMPUTATION DIGIT;  Surgeon: Newt Minion, MD;  Location: [place];  Service: Orthopedics;  Laterality: Left;  Left 2nd Toe Amputation at MTP Joint    Family History  Problem Relation Age of Onset  . Family history unknown: Yes    Allergies  Allergen Reactions  . Nitrofurantoin Itching  . Captopril Other (See Comments)    REACTION: unspecified  . Enalapril Maleate Cough  . Ramipril Other (See Comments)    REACTION: unspecified  . Sulfa Antibiotics Other (See Comments)    Reaction unknown  . Verapamil Other (See Comments)    REACTION: unspecified     Current Outpatient Prescriptions on File Prior to Visit  Medication Sig Dispense Refill  . aspirin EC 81 MG tablet Take 81 mg by mouth at bedtime.    . calcium carbonate (TUMS - DOSED IN MG ELEMENTAL CALCIUM) 500 MG chewable tablet Chew 1 tablet (200 mg of elemental calcium total) by mouth 3 (three) times daily with meals.    . folic acid (FOLVITE) 0.5 MG tablet Take 0.5 tablets (0.5 mg total) by mouth daily.    Marland Kitchen levothyroxine (SYNTHROID, LEVOTHROID) 75 MCG tablet TAKE ONE TABLET BY MOUTH ONCE DAILY 30 tablet 11  . metoprolol (LOPRESSOR) 50 MG tablet Take 1 tablet (50 mg total) by mouth 2 (two) times daily. 180 tablet 3  . pravastatin (PRAVACHOL) 20 MG tablet TAKE ONE TABLET BY MOUTH IN THE EVENING 90 tablet 3  . vitamin C (VITAMIN C) 500 MG tablet Take 1 tablet (500 mg total) by mouth daily.    . metoprolol (LOPRESSOR) 50 MG tablet TAKE ONE & ONE-HALF TABLETS BY MOUTH TWICE DAILY (Patient not taking: Reported on 01/22/2015) 90 tablet 3   No current facility-administered medications on file prior to visit.    BP 134/62 mmHg  Pulse 66  Temp(Src) 98.1 F (36.7 C) (Oral)  Ht 5\' 3"  (1.6 m)  Wt 113 lb 12.8 oz (51.619 kg)  BMI 20.16 kg/m2  SpO2 95%    Objective:   Physical Exam  Constitutional: She appears well-nourished.  HENT:  Right Ear: Tympanic membrane and ear canal normal.  Left Ear: Tympanic membrane and ear canal normal.  Nose: Nose normal. Right sinus exhibits no maxillary sinus tenderness and no frontal sinus tenderness. Left sinus exhibits no maxillary sinus tenderness and no frontal sinus tenderness.  Mouth/Throat: Oropharynx is clear and moist.  Cardiovascular: Normal rate and regular rhythm.   Pulmonary/Chest: Effort normal and breath sounds normal. She has no wheezes. She has no rales.  Skin: Skin is warm and dry.          Assessment & Plan:  URI:  Cough, congestion, fatigue x 5 days. Some production (yellow sputum). Overall feeling slightly improved  since last week. Exam with clear lungs, HENT unremarkable which is reassuring. Suspect viral URI at this point and will treat with supportive measures. Printed RX for amoxil for her to fill this weekend if symptoms progress. Robitussin DM, fluids, rest. Return precautions provided.

## 2015-01-23 DIAGNOSIS — D631 Anemia in chronic kidney disease: Secondary | ICD-10-CM | POA: Diagnosis not present

## 2015-01-23 DIAGNOSIS — N186 End stage renal disease: Secondary | ICD-10-CM | POA: Diagnosis not present

## 2015-01-23 DIAGNOSIS — E875 Hyperkalemia: Secondary | ICD-10-CM | POA: Diagnosis not present

## 2015-01-25 DIAGNOSIS — N186 End stage renal disease: Secondary | ICD-10-CM | POA: Diagnosis not present

## 2015-01-25 DIAGNOSIS — D631 Anemia in chronic kidney disease: Secondary | ICD-10-CM | POA: Diagnosis not present

## 2015-01-25 DIAGNOSIS — E875 Hyperkalemia: Secondary | ICD-10-CM | POA: Diagnosis not present

## 2015-01-26 ENCOUNTER — Other Ambulatory Visit: Payer: Self-pay | Admitting: Cardiovascular Disease

## 2015-01-28 DIAGNOSIS — E875 Hyperkalemia: Secondary | ICD-10-CM | POA: Diagnosis not present

## 2015-01-28 DIAGNOSIS — D631 Anemia in chronic kidney disease: Secondary | ICD-10-CM | POA: Diagnosis not present

## 2015-01-28 DIAGNOSIS — N186 End stage renal disease: Secondary | ICD-10-CM | POA: Diagnosis not present

## 2015-01-30 DIAGNOSIS — D631 Anemia in chronic kidney disease: Secondary | ICD-10-CM | POA: Diagnosis not present

## 2015-01-30 DIAGNOSIS — N186 End stage renal disease: Secondary | ICD-10-CM | POA: Diagnosis not present

## 2015-01-30 DIAGNOSIS — E875 Hyperkalemia: Secondary | ICD-10-CM | POA: Diagnosis not present

## 2015-02-01 DIAGNOSIS — N186 End stage renal disease: Secondary | ICD-10-CM | POA: Diagnosis not present

## 2015-02-01 DIAGNOSIS — D631 Anemia in chronic kidney disease: Secondary | ICD-10-CM | POA: Diagnosis not present

## 2015-02-01 DIAGNOSIS — E875 Hyperkalemia: Secondary | ICD-10-CM | POA: Diagnosis not present

## 2015-02-04 DIAGNOSIS — N186 End stage renal disease: Secondary | ICD-10-CM | POA: Diagnosis not present

## 2015-02-04 DIAGNOSIS — E875 Hyperkalemia: Secondary | ICD-10-CM | POA: Diagnosis not present

## 2015-02-04 DIAGNOSIS — D631 Anemia in chronic kidney disease: Secondary | ICD-10-CM | POA: Diagnosis not present

## 2015-02-05 DIAGNOSIS — Z992 Dependence on renal dialysis: Secondary | ICD-10-CM | POA: Diagnosis not present

## 2015-02-05 DIAGNOSIS — I120 Hypertensive chronic kidney disease with stage 5 chronic kidney disease or end stage renal disease: Secondary | ICD-10-CM | POA: Diagnosis not present

## 2015-02-05 DIAGNOSIS — N186 End stage renal disease: Secondary | ICD-10-CM | POA: Diagnosis not present

## 2015-02-06 DIAGNOSIS — D631 Anemia in chronic kidney disease: Secondary | ICD-10-CM | POA: Diagnosis not present

## 2015-02-06 DIAGNOSIS — N186 End stage renal disease: Secondary | ICD-10-CM | POA: Diagnosis not present

## 2015-02-06 DIAGNOSIS — E875 Hyperkalemia: Secondary | ICD-10-CM | POA: Diagnosis not present

## 2015-02-08 DIAGNOSIS — D631 Anemia in chronic kidney disease: Secondary | ICD-10-CM | POA: Diagnosis not present

## 2015-02-08 DIAGNOSIS — N186 End stage renal disease: Secondary | ICD-10-CM | POA: Diagnosis not present

## 2015-02-08 DIAGNOSIS — E875 Hyperkalemia: Secondary | ICD-10-CM | POA: Diagnosis not present

## 2015-02-11 DIAGNOSIS — D631 Anemia in chronic kidney disease: Secondary | ICD-10-CM | POA: Diagnosis not present

## 2015-02-11 DIAGNOSIS — E875 Hyperkalemia: Secondary | ICD-10-CM | POA: Diagnosis not present

## 2015-02-11 DIAGNOSIS — N186 End stage renal disease: Secondary | ICD-10-CM | POA: Diagnosis not present

## 2015-02-13 DIAGNOSIS — E875 Hyperkalemia: Secondary | ICD-10-CM | POA: Diagnosis not present

## 2015-02-13 DIAGNOSIS — N186 End stage renal disease: Secondary | ICD-10-CM | POA: Diagnosis not present

## 2015-02-13 DIAGNOSIS — D631 Anemia in chronic kidney disease: Secondary | ICD-10-CM | POA: Diagnosis not present

## 2015-02-15 DIAGNOSIS — D631 Anemia in chronic kidney disease: Secondary | ICD-10-CM | POA: Diagnosis not present

## 2015-02-15 DIAGNOSIS — N186 End stage renal disease: Secondary | ICD-10-CM | POA: Diagnosis not present

## 2015-02-15 DIAGNOSIS — E875 Hyperkalemia: Secondary | ICD-10-CM | POA: Diagnosis not present

## 2015-02-18 DIAGNOSIS — D631 Anemia in chronic kidney disease: Secondary | ICD-10-CM | POA: Diagnosis not present

## 2015-02-18 DIAGNOSIS — E875 Hyperkalemia: Secondary | ICD-10-CM | POA: Diagnosis not present

## 2015-02-18 DIAGNOSIS — N186 End stage renal disease: Secondary | ICD-10-CM | POA: Diagnosis not present

## 2015-02-20 DIAGNOSIS — D631 Anemia in chronic kidney disease: Secondary | ICD-10-CM | POA: Diagnosis not present

## 2015-02-20 DIAGNOSIS — N186 End stage renal disease: Secondary | ICD-10-CM | POA: Diagnosis not present

## 2015-02-20 DIAGNOSIS — E875 Hyperkalemia: Secondary | ICD-10-CM | POA: Diagnosis not present

## 2015-02-22 DIAGNOSIS — N186 End stage renal disease: Secondary | ICD-10-CM | POA: Diagnosis not present

## 2015-02-22 DIAGNOSIS — E875 Hyperkalemia: Secondary | ICD-10-CM | POA: Diagnosis not present

## 2015-02-22 DIAGNOSIS — D631 Anemia in chronic kidney disease: Secondary | ICD-10-CM | POA: Diagnosis not present

## 2015-02-25 DIAGNOSIS — D631 Anemia in chronic kidney disease: Secondary | ICD-10-CM | POA: Diagnosis not present

## 2015-02-25 DIAGNOSIS — E875 Hyperkalemia: Secondary | ICD-10-CM | POA: Diagnosis not present

## 2015-02-25 DIAGNOSIS — N186 End stage renal disease: Secondary | ICD-10-CM | POA: Diagnosis not present

## 2015-02-26 DIAGNOSIS — C44329 Squamous cell carcinoma of skin of other parts of face: Secondary | ICD-10-CM | POA: Diagnosis not present

## 2015-02-26 DIAGNOSIS — L905 Scar conditions and fibrosis of skin: Secondary | ICD-10-CM | POA: Diagnosis not present

## 2015-02-27 DIAGNOSIS — E875 Hyperkalemia: Secondary | ICD-10-CM | POA: Diagnosis not present

## 2015-02-27 DIAGNOSIS — D631 Anemia in chronic kidney disease: Secondary | ICD-10-CM | POA: Diagnosis not present

## 2015-02-27 DIAGNOSIS — N186 End stage renal disease: Secondary | ICD-10-CM | POA: Diagnosis not present

## 2015-03-01 DIAGNOSIS — D631 Anemia in chronic kidney disease: Secondary | ICD-10-CM | POA: Diagnosis not present

## 2015-03-01 DIAGNOSIS — N186 End stage renal disease: Secondary | ICD-10-CM | POA: Diagnosis not present

## 2015-03-01 DIAGNOSIS — E875 Hyperkalemia: Secondary | ICD-10-CM | POA: Diagnosis not present

## 2015-03-04 ENCOUNTER — Encounter: Payer: Self-pay | Admitting: Emergency Medicine

## 2015-03-04 ENCOUNTER — Emergency Department
Admission: EM | Admit: 2015-03-04 | Discharge: 2015-03-04 | Disposition: A | Discharge status: Home / Self Care | Payer: Medicare Other | Attending: Emergency Medicine | Admitting: Emergency Medicine

## 2015-03-04 DIAGNOSIS — Z79899 Other long term (current) drug therapy: Secondary | ICD-10-CM | POA: Insufficient documentation

## 2015-03-04 DIAGNOSIS — N2889 Other specified disorders of kidney and ureter: Secondary | ICD-10-CM

## 2015-03-04 DIAGNOSIS — Z992 Dependence on renal dialysis: Secondary | ICD-10-CM | POA: Diagnosis not present

## 2015-03-04 DIAGNOSIS — T82838A Hemorrhage of vascular prosthetic devices, implants and grafts, initial encounter: Secondary | ICD-10-CM | POA: Diagnosis not present

## 2015-03-04 DIAGNOSIS — Z7982 Long term (current) use of aspirin: Secondary | ICD-10-CM | POA: Insufficient documentation

## 2015-03-04 DIAGNOSIS — N186 End stage renal disease: Secondary | ICD-10-CM | POA: Insufficient documentation

## 2015-03-04 DIAGNOSIS — Z792 Long term (current) use of antibiotics: Secondary | ICD-10-CM | POA: Insufficient documentation

## 2015-03-04 DIAGNOSIS — Y658 Other specified misadventures during surgical and medical care: Secondary | ICD-10-CM | POA: Diagnosis not present

## 2015-03-04 DIAGNOSIS — Z87891 Personal history of nicotine dependence: Secondary | ICD-10-CM | POA: Insufficient documentation

## 2015-03-04 DIAGNOSIS — R58 Hemorrhage, not elsewhere classified: Secondary | ICD-10-CM | POA: Diagnosis not present

## 2015-03-04 DIAGNOSIS — T829XXA Unspecified complication of cardiac and vascular prosthetic device, implant and graft, initial encounter: Secondary | ICD-10-CM | POA: Diagnosis not present

## 2015-03-04 DIAGNOSIS — E1122 Type 2 diabetes mellitus with diabetic chronic kidney disease: Secondary | ICD-10-CM | POA: Diagnosis not present

## 2015-03-04 DIAGNOSIS — D631 Anemia in chronic kidney disease: Secondary | ICD-10-CM | POA: Diagnosis not present

## 2015-03-04 DIAGNOSIS — I151 Hypertension secondary to other renal disorders: Secondary | ICD-10-CM | POA: Insufficient documentation

## 2015-03-04 DIAGNOSIS — Z743 Need for continuous supervision: Secondary | ICD-10-CM | POA: Diagnosis not present

## 2015-03-04 DIAGNOSIS — I10 Essential (primary) hypertension: Secondary | ICD-10-CM | POA: Diagnosis not present

## 2015-03-04 DIAGNOSIS — E875 Hyperkalemia: Secondary | ICD-10-CM | POA: Diagnosis not present

## 2015-03-04 MED ORDER — METOPROLOL TARTRATE 50 MG PO TABS
50.0000 mg | ORAL_TABLET | Freq: Once | ORAL | Status: AC
  Administered 2015-03-04: 50 mg via ORAL
  Filled 2015-03-04: qty 1

## 2015-03-04 NOTE — ED Provider Notes (Signed)
[facility] Emergency Department Provider Note  ____________________________________________  Time seen: Approximately 5:48 PM  I have reviewed the triage vital signs and the nursing notes.   HISTORY  Chief Complaint Vascular Access Problem    HPI [name] is a 82 y.o. female with a history of ESRD on HD, left upper extremity fistula, presenting for bleeding fistula. Patient reports that she completed dialysis, and when they D cannulized her fistula began to bleed. They held pressure for one hour without improvement so she was sent to the emergency department. She denies any shortness of breath, lightheadedness or fainting, or chest pain.   Past Medical History  Diagnosis Date  . Anemia     NOS  . Personal history of colonic polyps   . GERD (gastroesophageal reflux disease)   . Hyperlipidemia     takes Simvastatin nightly  . Osteoarthritis   . Osteopenia   . Urinary incontinence   . Pulmonary hypertension ([place])   . Anxiety   . Renal insufficiency   . Coronary artery disease   . Aortic stenosis   . Hyperlipidemia   . Hypertension     takes Metoprolol daily  . Peripheral vascular disease ([place])   . CHF (congestive heart failure) ([place])   . Migraine     hx of  . Constipation   . Diarrhea   . Oligouria   . History of blood transfusion   . Diabetes mellitus     type II;was on Glipizide but has been off x 26mon  . Hypothyroidism     takes Synthroid daily  . Cancer [facility])     Mole on top of head to be removed in July 2013  . UTI (urinary tract infection)     Patient Active Problem List   Diagnosis Date Noted  . Preventative health care 05/03/2014  . Peripheral vascular disease ([place]) 12/21/2013  . Toe amputation status ([place]) 08/29/2013  . History of recent fall 04/18/2013  . S/P aortic valve replacement with bioprosthetic valve 11/15/2012  . S/P CABG x 3 11/15/2012  . Anemia of chronic kidney failure 12/22/2010  . Thrombocytopenia ([place])  12/13/2010  . End stage renal disease on dialysis ([place]) 12/13/2010  . S/P aortic valve replacement 08/11/2010  . Arterial insufficiency-lower 07/25/2010  . Constipation, other cause 06/23/2010  . NEURODERMATITIS 04/08/2009  . Carotid artery stenosis 01/01/2009  . Coronary atherosclerosis 02/11/2007  . AORTIC STENOSIS 10/04/2006  . ANXIETY 05/30/2006  . Pulmonary hypertension ([place]) 05/30/2006  . Hypothyroidism 05/27/2006  . Type 2 diabetes mellitus with renal manifestations, controlled ([place]) 05/27/2006  . Hyperlipidemia 05/27/2006  . Essential hypertension 05/27/2006  . GERD 05/27/2006  . OSTEOARTHRITIS 05/27/2006  . OSTEOPENIA 05/27/2006  . URINARY INCONTINENCE 05/27/2006    Past Surgical History  Procedure Laterality Date  . Abdominal hysterectomy    . Tonsillectomy    . Cardiac catheterization  2000    cad  . Replacement total knee bilateral  05/1998  . Cataract extraction    . Coronary artery bypass graft      od  . Adenosine myoview  2007    benign, EF 69%  . Carotid endarterectomy  2011    Right  . Coronary artery bypass graft  2009  . Aortic valve replacement  2009  . Amputation-left great toe  7/12    Dr Marlou Sa  . Traumatic amputation of right dip joint of index finger    . Insertion of dialysis catheter  12/18/2010    Procedure:  INSERTION OF DIALYSIS CATHETER;  Surgeon: Mal Misty, MD;  Location: [place];  Service: Vascular;  Laterality: Right;  . Av fistula placement  12/22/2010    Procedure: ARTERIOVENOUS (AV) FISTULA CREATION;  Surgeon: Hinda Lenis, MD;  Location: [place];  Service: Vascular;  Laterality: Left;  Creation of Left Brachial-Cephalic Fistula  . Appendectomy    . Eye surgery      BIlateral  . Amputation  06/18/2011    Procedure: AMPUTATION DIGIT;  Surgeon: Newt Minion, MD;  Location: [place];  Service: Orthopedics;  Laterality: Left;  Left 2nd Toe Amputation at MTP Joint    Current Outpatient Rx  Name  Route  Sig  Dispense  Refill  .  amoxicillin (AMOXIL) 875 MG tablet   Oral   Take 1 tablet (875 mg total) by mouth 2 (two) times daily.   10 tablet   0   . aspirin EC 81 MG tablet   Oral   Take 81 mg by mouth at bedtime.         . calcium carbonate (TUMS - DOSED IN MG ELEMENTAL CALCIUM) 500 MG chewable tablet   Oral   Chew 1 tablet (200 mg of elemental calcium total) by mouth 3 (three) times daily with meals.         . folic acid (FOLVITE) 0.5 MG tablet   Oral   Take 0.5 tablets (0.5 mg total) by mouth daily.         Marland Kitchen levothyroxine (SYNTHROID, LEVOTHROID) 75 MCG tablet      TAKE ONE TABLET BY MOUTH ONCE DAILY   30 tablet   11   . metoprolol (LOPRESSOR) 50 MG tablet   Oral   Take 1 tablet (50 mg total) by mouth 2 (two) times daily.   180 tablet   3   . metoprolol (LOPRESSOR) 50 MG tablet      TAKE ONE & ONE-HALF TABLETS BY MOUTH TWICE DAILY Patient not taking: Reported on 01/22/2015   90 tablet   3   . pravastatin (PRAVACHOL) 20 MG tablet      TAKE ONE TABLET BY MOUTH IN THE EVENING   90 tablet   3   . vitamin C (VITAMIN C) 500 MG tablet   Oral   Take 1 tablet (500 mg total) by mouth daily.           Allergies Nitrofurantoin; Captopril; Enalapril maleate; Ramipril; Sulfa antibiotics; and Verapamil  Family History  Problem Relation Age of Onset  . Family history unknown: Yes    Social History Social History  Substance Use Topics  . Smoking status: Former Smoker -- 15 years    Types: Cigarettes    Quit date: 01/06/1943  . Smokeless tobacco: Never Used  . Alcohol Use: Yes     Comment: Wine occasionally    Review of Systems Constitutional: No fever/chills. No lightheadedness or syncope. Eyes: No visual changes. ENT: No sore throat. Cardiovascular: Denies chest pain, palpitations. Respiratory: Denies shortness of breath.  No cough. Gastrointestinal: No abdominal pain.  No nausea, no vomiting.  No diarrhea.  No constipation. Musculoskeletal: Negative for back  pain. CIRCULATORY: Positive fistula bleeding. Skin: Negative for rash. Neurological: Negative for headaches, focal weakness or numbness.  10-point ROS otherwise negative.  ____________________________________________   PHYSICAL EXAM:  VITAL SIGNS: ED Triage Vitals  Enc Vitals Group     BP 03/04/15 1727 238/63 mmHg     Pulse Rate 03/04/15 1727 87     Resp  03/04/15 1727 17     Temp 03/04/15 1727 97.8 F (36.6 C)     Temp Source 03/04/15 1727 Oral     SpO2 03/04/15 1727 97 %     Weight 03/04/15 1727 117 lb (53.071 kg)     Height 03/04/15 1727 5\' 3"  (1.6 m)     Head Cir --      Peak Flow --      Pain Score --      Pain Loc --      Pain Edu? --      Excl. in [place]? --     Constitutional: Alert and oriented. Well appearing and in no acute distress. Answer question appropriately. Eyes: Conjunctivae are normal.  EOMI. Head: Atraumatic. Nose: No congestion/rhinnorhea. Mouth/Throat: Mucous membranes are moist.  Neck: No stridor.  Supple.   Cardiovascular: Normal rate,  Respiratory: Normal respiratory effort.   Circulatory:Left upper extremity has a fistula with excellent thrill. 2 punctate wounds, the upper one which is hemostatic in the lower 1 which has a single drop of blood upon removal of the gauze. No evidence of active bleeding or hemorrhaging. Normal left radial pulse. Musculoskeletal: No LE edema.  Neurologic:  Normal speech and language. No gross focal neurologic deficits are appreciated.  Skin:  Skin is warm, dry and intact. No rash noted. Psychiatric: Mood and affect are normal. Speech and behavior are normal.  Normal judgement.  ____________________________________________   LABS (all labs ordered are listed, but only abnormal results are displayed)  Labs Reviewed - No data to display ____________________________________________  EKG  ED ECG REPORT I, Eula Listen, the attending physician, personally viewed and interpreted this ECG.   Date:  03/04/2015  EKG Time: 1729  Rate: 82  Rhythm: normal sinus rhythm  Axis: Leftward  Intervals:Prolonged QTC  ST&T Change: No STEMI  ____________________________________________  RADIOLOGY  No results found.  ____________________________________________   PROCEDURES  Procedure(s) performed: None  Critical Care performed: No ____________________________________________   INITIAL IMPRESSION / ASSESSMENT AND PLAN / ED COURSE  Pertinent labs & imaging results that were available during my care of the patient were reviewed by me and considered in my medical decision making (see chart for details).  82 y.o. female with ESRD on hemodialysis with bleeding left upper [place] a fistula. On arrival to the emergency department, the patient is hypertensive but has no evidence of acute hemorrhaging or any active bleeding from her fistula. Her pulses in the 80s and she is not symptomatic. We stood her up and walked her around the room and she did not have any shortness of breath or or lightheadedness. There is no indication for further laboratory studies. We'll give her her evening antihypertensive and plan discharge. We have replaced the gauze with tape over the punctate areas and the patient has been instructed not to remove them until she sees her surgeon tomorrow, who she is seeing for revision of her AV fistula  ____________________________________________  FINAL CLINICAL IMPRESSION(S) / ED DIAGNOSES  Final diagnoses:  Complication of AV dialysis fistula, initial encounter  Bleeding  Hypertension secondary to other renal disorders      NEW MEDICATIONS STARTED DURING THIS VISIT:  New Prescriptions   No medications on file     Eula Listen, MD 03/04/15 1755

## 2015-03-04 NOTE — ED Notes (Signed)
Pt in via EMS from [facility] w/ complaints of fistula bleeding x 2 hours.  Pt A/Ox4, hypertensive upon arrival, 238/63, pt denies headache.  Fistula wrapped with gauze per EMS; not bleeding through bandage at this time.

## 2015-03-04 NOTE — Discharge Instructions (Signed)
Please keep the dressings on your left arm fistula until you are seen by the surgeons tomorrow. Please do not take any additional blood pressure medication tonight, as you have artery had your evening blood pressure medication in the emergency department.  Return to the emergency department if he develops severe pain, lightheadedness, shortness of breath, fainting, fever, bleeding, or any other symptoms concerning to you.

## 2015-03-05 DIAGNOSIS — T82858D Stenosis of vascular prosthetic devices, implants and grafts, subsequent encounter: Secondary | ICD-10-CM | POA: Diagnosis not present

## 2015-03-05 DIAGNOSIS — I871 Compression of vein: Secondary | ICD-10-CM | POA: Diagnosis not present

## 2015-03-05 DIAGNOSIS — N186 End stage renal disease: Secondary | ICD-10-CM | POA: Diagnosis not present

## 2015-03-05 DIAGNOSIS — I120 Hypertensive chronic kidney disease with stage 5 chronic kidney disease or end stage renal disease: Secondary | ICD-10-CM | POA: Diagnosis not present

## 2015-03-05 DIAGNOSIS — Z992 Dependence on renal dialysis: Secondary | ICD-10-CM | POA: Diagnosis not present

## 2015-03-06 DIAGNOSIS — N186 End stage renal disease: Secondary | ICD-10-CM | POA: Diagnosis not present

## 2015-03-06 DIAGNOSIS — E875 Hyperkalemia: Secondary | ICD-10-CM | POA: Diagnosis not present

## 2015-03-06 DIAGNOSIS — E119 Type 2 diabetes mellitus without complications: Secondary | ICD-10-CM | POA: Diagnosis not present

## 2015-03-06 DIAGNOSIS — D509 Iron deficiency anemia, unspecified: Secondary | ICD-10-CM | POA: Diagnosis not present

## 2015-03-06 DIAGNOSIS — D631 Anemia in chronic kidney disease: Secondary | ICD-10-CM | POA: Diagnosis not present

## 2015-03-08 DIAGNOSIS — D509 Iron deficiency anemia, unspecified: Secondary | ICD-10-CM | POA: Diagnosis not present

## 2015-03-08 DIAGNOSIS — D631 Anemia in chronic kidney disease: Secondary | ICD-10-CM | POA: Diagnosis not present

## 2015-03-08 DIAGNOSIS — N186 End stage renal disease: Secondary | ICD-10-CM | POA: Diagnosis not present

## 2015-03-08 DIAGNOSIS — E119 Type 2 diabetes mellitus without complications: Secondary | ICD-10-CM | POA: Diagnosis not present

## 2015-03-08 DIAGNOSIS — E875 Hyperkalemia: Secondary | ICD-10-CM | POA: Diagnosis not present

## 2015-03-11 DIAGNOSIS — E119 Type 2 diabetes mellitus without complications: Secondary | ICD-10-CM | POA: Diagnosis not present

## 2015-03-11 DIAGNOSIS — D509 Iron deficiency anemia, unspecified: Secondary | ICD-10-CM | POA: Diagnosis not present

## 2015-03-11 DIAGNOSIS — D631 Anemia in chronic kidney disease: Secondary | ICD-10-CM | POA: Diagnosis not present

## 2015-03-11 DIAGNOSIS — E875 Hyperkalemia: Secondary | ICD-10-CM | POA: Diagnosis not present

## 2015-03-11 DIAGNOSIS — N186 End stage renal disease: Secondary | ICD-10-CM | POA: Diagnosis not present

## 2015-03-13 DIAGNOSIS — D631 Anemia in chronic kidney disease: Secondary | ICD-10-CM | POA: Diagnosis not present

## 2015-03-13 DIAGNOSIS — N186 End stage renal disease: Secondary | ICD-10-CM | POA: Diagnosis not present

## 2015-03-13 DIAGNOSIS — D509 Iron deficiency anemia, unspecified: Secondary | ICD-10-CM | POA: Diagnosis not present

## 2015-03-13 DIAGNOSIS — E875 Hyperkalemia: Secondary | ICD-10-CM | POA: Diagnosis not present

## 2015-03-13 DIAGNOSIS — E119 Type 2 diabetes mellitus without complications: Secondary | ICD-10-CM | POA: Diagnosis not present

## 2015-03-15 DIAGNOSIS — D631 Anemia in chronic kidney disease: Secondary | ICD-10-CM | POA: Diagnosis not present

## 2015-03-15 DIAGNOSIS — N186 End stage renal disease: Secondary | ICD-10-CM | POA: Diagnosis not present

## 2015-03-15 DIAGNOSIS — D509 Iron deficiency anemia, unspecified: Secondary | ICD-10-CM | POA: Diagnosis not present

## 2015-03-15 DIAGNOSIS — E119 Type 2 diabetes mellitus without complications: Secondary | ICD-10-CM | POA: Diagnosis not present

## 2015-03-15 DIAGNOSIS — E875 Hyperkalemia: Secondary | ICD-10-CM | POA: Diagnosis not present

## 2015-03-18 DIAGNOSIS — E875 Hyperkalemia: Secondary | ICD-10-CM | POA: Diagnosis not present

## 2015-03-18 DIAGNOSIS — D631 Anemia in chronic kidney disease: Secondary | ICD-10-CM | POA: Diagnosis not present

## 2015-03-18 DIAGNOSIS — N186 End stage renal disease: Secondary | ICD-10-CM | POA: Diagnosis not present

## 2015-03-18 DIAGNOSIS — D509 Iron deficiency anemia, unspecified: Secondary | ICD-10-CM | POA: Diagnosis not present

## 2015-03-18 DIAGNOSIS — E119 Type 2 diabetes mellitus without complications: Secondary | ICD-10-CM | POA: Diagnosis not present

## 2015-03-20 DIAGNOSIS — E119 Type 2 diabetes mellitus without complications: Secondary | ICD-10-CM | POA: Diagnosis not present

## 2015-03-20 DIAGNOSIS — N186 End stage renal disease: Secondary | ICD-10-CM | POA: Diagnosis not present

## 2015-03-20 DIAGNOSIS — D509 Iron deficiency anemia, unspecified: Secondary | ICD-10-CM | POA: Diagnosis not present

## 2015-03-20 DIAGNOSIS — E875 Hyperkalemia: Secondary | ICD-10-CM | POA: Diagnosis not present

## 2015-03-20 DIAGNOSIS — D631 Anemia in chronic kidney disease: Secondary | ICD-10-CM | POA: Diagnosis not present

## 2015-03-22 DIAGNOSIS — E875 Hyperkalemia: Secondary | ICD-10-CM | POA: Diagnosis not present

## 2015-03-22 DIAGNOSIS — D631 Anemia in chronic kidney disease: Secondary | ICD-10-CM | POA: Diagnosis not present

## 2015-03-22 DIAGNOSIS — N186 End stage renal disease: Secondary | ICD-10-CM | POA: Diagnosis not present

## 2015-03-22 DIAGNOSIS — D509 Iron deficiency anemia, unspecified: Secondary | ICD-10-CM | POA: Diagnosis not present

## 2015-03-22 DIAGNOSIS — E119 Type 2 diabetes mellitus without complications: Secondary | ICD-10-CM | POA: Diagnosis not present

## 2015-03-25 DIAGNOSIS — D631 Anemia in chronic kidney disease: Secondary | ICD-10-CM | POA: Diagnosis not present

## 2015-03-25 DIAGNOSIS — E875 Hyperkalemia: Secondary | ICD-10-CM | POA: Diagnosis not present

## 2015-03-25 DIAGNOSIS — E119 Type 2 diabetes mellitus without complications: Secondary | ICD-10-CM | POA: Diagnosis not present

## 2015-03-25 DIAGNOSIS — D509 Iron deficiency anemia, unspecified: Secondary | ICD-10-CM | POA: Diagnosis not present

## 2015-03-25 DIAGNOSIS — N186 End stage renal disease: Secondary | ICD-10-CM | POA: Diagnosis not present

## 2015-03-27 DIAGNOSIS — D509 Iron deficiency anemia, unspecified: Secondary | ICD-10-CM | POA: Diagnosis not present

## 2015-03-27 DIAGNOSIS — E119 Type 2 diabetes mellitus without complications: Secondary | ICD-10-CM | POA: Diagnosis not present

## 2015-03-27 DIAGNOSIS — D631 Anemia in chronic kidney disease: Secondary | ICD-10-CM | POA: Diagnosis not present

## 2015-03-27 DIAGNOSIS — N186 End stage renal disease: Secondary | ICD-10-CM | POA: Diagnosis not present

## 2015-03-27 DIAGNOSIS — E875 Hyperkalemia: Secondary | ICD-10-CM | POA: Diagnosis not present

## 2015-03-29 DIAGNOSIS — E875 Hyperkalemia: Secondary | ICD-10-CM | POA: Diagnosis not present

## 2015-03-29 DIAGNOSIS — D509 Iron deficiency anemia, unspecified: Secondary | ICD-10-CM | POA: Diagnosis not present

## 2015-03-29 DIAGNOSIS — D631 Anemia in chronic kidney disease: Secondary | ICD-10-CM | POA: Diagnosis not present

## 2015-03-29 DIAGNOSIS — E119 Type 2 diabetes mellitus without complications: Secondary | ICD-10-CM | POA: Diagnosis not present

## 2015-03-29 DIAGNOSIS — N186 End stage renal disease: Secondary | ICD-10-CM | POA: Diagnosis not present

## 2015-04-01 ENCOUNTER — Other Ambulatory Visit
Admission: RE | Admit: 2015-04-01 | Discharge: 2015-04-01 | Disposition: A | Discharge status: Home / Self Care | Payer: Medicare Other | Source: Ambulatory Visit | Attending: Nephrology | Admitting: Nephrology

## 2015-04-01 DIAGNOSIS — E119 Type 2 diabetes mellitus without complications: Secondary | ICD-10-CM | POA: Diagnosis not present

## 2015-04-01 DIAGNOSIS — D631 Anemia in chronic kidney disease: Secondary | ICD-10-CM | POA: Diagnosis not present

## 2015-04-01 DIAGNOSIS — E875 Hyperkalemia: Secondary | ICD-10-CM | POA: Insufficient documentation

## 2015-04-01 DIAGNOSIS — N186 End stage renal disease: Secondary | ICD-10-CM | POA: Diagnosis not present

## 2015-04-01 DIAGNOSIS — D509 Iron deficiency anemia, unspecified: Secondary | ICD-10-CM | POA: Diagnosis not present

## 2015-04-01 LAB — POTASSIUM: Potassium: 6.5 mmol/L — ABNORMAL HIGH (ref 3.5–5.1)

## 2015-04-03 DIAGNOSIS — E119 Type 2 diabetes mellitus without complications: Secondary | ICD-10-CM | POA: Diagnosis not present

## 2015-04-03 DIAGNOSIS — N186 End stage renal disease: Secondary | ICD-10-CM | POA: Diagnosis not present

## 2015-04-03 DIAGNOSIS — E875 Hyperkalemia: Secondary | ICD-10-CM | POA: Diagnosis not present

## 2015-04-03 DIAGNOSIS — D631 Anemia in chronic kidney disease: Secondary | ICD-10-CM | POA: Diagnosis not present

## 2015-04-03 DIAGNOSIS — D509 Iron deficiency anemia, unspecified: Secondary | ICD-10-CM | POA: Diagnosis not present

## 2015-04-05 DIAGNOSIS — Z992 Dependence on renal dialysis: Secondary | ICD-10-CM | POA: Diagnosis not present

## 2015-04-05 DIAGNOSIS — E875 Hyperkalemia: Secondary | ICD-10-CM | POA: Diagnosis not present

## 2015-04-05 DIAGNOSIS — E119 Type 2 diabetes mellitus without complications: Secondary | ICD-10-CM | POA: Diagnosis not present

## 2015-04-05 DIAGNOSIS — D631 Anemia in chronic kidney disease: Secondary | ICD-10-CM | POA: Diagnosis not present

## 2015-04-05 DIAGNOSIS — N186 End stage renal disease: Secondary | ICD-10-CM | POA: Diagnosis not present

## 2015-04-05 DIAGNOSIS — I120 Hypertensive chronic kidney disease with stage 5 chronic kidney disease or end stage renal disease: Secondary | ICD-10-CM | POA: Diagnosis not present

## 2015-04-05 DIAGNOSIS — D509 Iron deficiency anemia, unspecified: Secondary | ICD-10-CM | POA: Diagnosis not present

## 2015-04-08 DIAGNOSIS — N186 End stage renal disease: Secondary | ICD-10-CM | POA: Diagnosis not present

## 2015-04-08 DIAGNOSIS — D509 Iron deficiency anemia, unspecified: Secondary | ICD-10-CM | POA: Diagnosis not present

## 2015-04-08 DIAGNOSIS — D631 Anemia in chronic kidney disease: Secondary | ICD-10-CM | POA: Diagnosis not present

## 2015-04-10 DIAGNOSIS — D509 Iron deficiency anemia, unspecified: Secondary | ICD-10-CM | POA: Diagnosis not present

## 2015-04-10 DIAGNOSIS — D631 Anemia in chronic kidney disease: Secondary | ICD-10-CM | POA: Diagnosis not present

## 2015-04-10 DIAGNOSIS — N186 End stage renal disease: Secondary | ICD-10-CM | POA: Diagnosis not present

## 2015-04-11 DIAGNOSIS — L97421 Non-pressure chronic ulcer of left heel and midfoot limited to breakdown of skin: Secondary | ICD-10-CM | POA: Diagnosis not present

## 2015-04-11 DIAGNOSIS — B351 Tinea unguium: Secondary | ICD-10-CM | POA: Diagnosis not present

## 2015-04-11 DIAGNOSIS — E1142 Type 2 diabetes mellitus with diabetic polyneuropathy: Secondary | ICD-10-CM | POA: Diagnosis not present

## 2015-04-12 ENCOUNTER — Other Ambulatory Visit: Payer: Self-pay | Admitting: Internal Medicine

## 2015-04-12 DIAGNOSIS — D509 Iron deficiency anemia, unspecified: Secondary | ICD-10-CM | POA: Diagnosis not present

## 2015-04-12 DIAGNOSIS — N186 End stage renal disease: Secondary | ICD-10-CM | POA: Diagnosis not present

## 2015-04-12 DIAGNOSIS — D631 Anemia in chronic kidney disease: Secondary | ICD-10-CM | POA: Diagnosis not present

## 2015-04-15 DIAGNOSIS — D509 Iron deficiency anemia, unspecified: Secondary | ICD-10-CM | POA: Diagnosis not present

## 2015-04-15 DIAGNOSIS — D631 Anemia in chronic kidney disease: Secondary | ICD-10-CM | POA: Diagnosis not present

## 2015-04-15 DIAGNOSIS — N186 End stage renal disease: Secondary | ICD-10-CM | POA: Diagnosis not present

## 2015-04-15 NOTE — Telephone Encounter (Signed)
Rx sent electronically.  

## 2015-04-17 DIAGNOSIS — D631 Anemia in chronic kidney disease: Secondary | ICD-10-CM | POA: Diagnosis not present

## 2015-04-17 DIAGNOSIS — D509 Iron deficiency anemia, unspecified: Secondary | ICD-10-CM | POA: Diagnosis not present

## 2015-04-17 DIAGNOSIS — N186 End stage renal disease: Secondary | ICD-10-CM | POA: Diagnosis not present

## 2015-04-19 DIAGNOSIS — D631 Anemia in chronic kidney disease: Secondary | ICD-10-CM | POA: Diagnosis not present

## 2015-04-19 DIAGNOSIS — N186 End stage renal disease: Secondary | ICD-10-CM | POA: Diagnosis not present

## 2015-04-19 DIAGNOSIS — D509 Iron deficiency anemia, unspecified: Secondary | ICD-10-CM | POA: Diagnosis not present

## 2015-04-22 DIAGNOSIS — D631 Anemia in chronic kidney disease: Secondary | ICD-10-CM | POA: Diagnosis not present

## 2015-04-22 DIAGNOSIS — D509 Iron deficiency anemia, unspecified: Secondary | ICD-10-CM | POA: Diagnosis not present

## 2015-04-22 DIAGNOSIS — N186 End stage renal disease: Secondary | ICD-10-CM | POA: Diagnosis not present

## 2015-04-24 DIAGNOSIS — N186 End stage renal disease: Secondary | ICD-10-CM | POA: Diagnosis not present

## 2015-04-24 DIAGNOSIS — D509 Iron deficiency anemia, unspecified: Secondary | ICD-10-CM | POA: Diagnosis not present

## 2015-04-24 DIAGNOSIS — D631 Anemia in chronic kidney disease: Secondary | ICD-10-CM | POA: Diagnosis not present

## 2015-04-26 DIAGNOSIS — D509 Iron deficiency anemia, unspecified: Secondary | ICD-10-CM | POA: Diagnosis not present

## 2015-04-26 DIAGNOSIS — D631 Anemia in chronic kidney disease: Secondary | ICD-10-CM | POA: Diagnosis not present

## 2015-04-26 DIAGNOSIS — N186 End stage renal disease: Secondary | ICD-10-CM | POA: Diagnosis not present

## 2015-04-29 DIAGNOSIS — N186 End stage renal disease: Secondary | ICD-10-CM | POA: Diagnosis not present

## 2015-04-29 DIAGNOSIS — D509 Iron deficiency anemia, unspecified: Secondary | ICD-10-CM | POA: Diagnosis not present

## 2015-04-29 DIAGNOSIS — D631 Anemia in chronic kidney disease: Secondary | ICD-10-CM | POA: Diagnosis not present

## 2015-05-01 DIAGNOSIS — D509 Iron deficiency anemia, unspecified: Secondary | ICD-10-CM | POA: Diagnosis not present

## 2015-05-01 DIAGNOSIS — N186 End stage renal disease: Secondary | ICD-10-CM | POA: Diagnosis not present

## 2015-05-01 DIAGNOSIS — D631 Anemia in chronic kidney disease: Secondary | ICD-10-CM | POA: Diagnosis not present

## 2015-05-02 DIAGNOSIS — E119 Type 2 diabetes mellitus without complications: Secondary | ICD-10-CM | POA: Diagnosis not present

## 2015-05-02 LAB — HM DIABETES EYE EXAM

## 2015-05-03 ENCOUNTER — Emergency Department
Admission: EM | Admit: 2015-05-03 | Discharge: 2015-05-03 | Disposition: A | Discharge status: Home / Self Care | Payer: Medicare Other | Attending: Emergency Medicine | Admitting: Emergency Medicine

## 2015-05-03 ENCOUNTER — Encounter: Payer: Self-pay | Admitting: *Deleted

## 2015-05-03 DIAGNOSIS — Z7982 Long term (current) use of aspirin: Secondary | ICD-10-CM | POA: Insufficient documentation

## 2015-05-03 DIAGNOSIS — E785 Hyperlipidemia, unspecified: Secondary | ICD-10-CM | POA: Insufficient documentation

## 2015-05-03 DIAGNOSIS — I2510 Atherosclerotic heart disease of native coronary artery without angina pectoris: Secondary | ICD-10-CM | POA: Diagnosis not present

## 2015-05-03 DIAGNOSIS — I509 Heart failure, unspecified: Secondary | ICD-10-CM | POA: Diagnosis not present

## 2015-05-03 DIAGNOSIS — I110 Hypertensive heart disease with heart failure: Secondary | ICD-10-CM | POA: Diagnosis not present

## 2015-05-03 DIAGNOSIS — Y732 Prosthetic and other implants, materials and accessory gastroenterology and urology devices associated with adverse incidents: Secondary | ICD-10-CM | POA: Diagnosis not present

## 2015-05-03 DIAGNOSIS — Z87891 Personal history of nicotine dependence: Secondary | ICD-10-CM | POA: Diagnosis not present

## 2015-05-03 DIAGNOSIS — Z79899 Other long term (current) drug therapy: Secondary | ICD-10-CM | POA: Diagnosis not present

## 2015-05-03 DIAGNOSIS — E119 Type 2 diabetes mellitus without complications: Secondary | ICD-10-CM | POA: Insufficient documentation

## 2015-05-03 DIAGNOSIS — E039 Hypothyroidism, unspecified: Secondary | ICD-10-CM | POA: Diagnosis not present

## 2015-05-03 DIAGNOSIS — T82838A Hemorrhage of vascular prosthetic devices, implants and grafts, initial encounter: Secondary | ICD-10-CM | POA: Insufficient documentation

## 2015-05-03 DIAGNOSIS — T829XXA Unspecified complication of cardiac and vascular prosthetic device, implant and graft, initial encounter: Secondary | ICD-10-CM

## 2015-05-03 DIAGNOSIS — K625 Hemorrhage of anus and rectum: Secondary | ICD-10-CM | POA: Diagnosis not present

## 2015-05-03 DIAGNOSIS — M1990 Unspecified osteoarthritis, unspecified site: Secondary | ICD-10-CM | POA: Insufficient documentation

## 2015-05-03 HISTORY — DX: Dependence on renal dialysis: Z99.2

## 2015-05-03 LAB — CBC WITH DIFFERENTIAL/PLATELET
Basophils Absolute: 0.1 10*3/uL (ref 0–0.1)
Basophils Relative: 1 %
EOS PCT: 6 %
Eosinophils Absolute: 0.5 10*3/uL (ref 0–0.7)
HCT: 30.9 % — ABNORMAL LOW (ref 35.0–47.0)
HEMOGLOBIN: 10.3 g/dL — AB (ref 12.0–16.0)
LYMPHS ABS: 0.8 10*3/uL — AB (ref 1.0–3.6)
LYMPHS PCT: 11 %
MCH: 33.5 pg (ref 26.0–34.0)
MCHC: 33.3 g/dL (ref 32.0–36.0)
MCV: 100.6 fL — AB (ref 80.0–100.0)
Monocytes Absolute: 1 10*3/uL — ABNORMAL HIGH (ref 0.2–0.9)
Monocytes Relative: 13 %
NEUTROS PCT: 69 %
Neutro Abs: 5 10*3/uL (ref 1.4–6.5)
Platelets: 169 10*3/uL (ref 150–440)
RBC: 3.08 MIL/uL — AB (ref 3.80–5.20)
RDW: 14.8 % — ABNORMAL HIGH (ref 11.5–14.5)
WBC: 7.3 10*3/uL (ref 3.6–11.0)

## 2015-05-03 LAB — PROTIME-INR
INR: 1
Prothrombin Time: 13.4 seconds (ref 11.4–15.0)

## 2015-05-03 LAB — BASIC METABOLIC PANEL
Anion gap: 14 (ref 5–15)
BUN: 68 mg/dL — AB (ref 6–20)
CHLORIDE: 98 mmol/L — AB (ref 101–111)
CO2: 24 mmol/L (ref 22–32)
Calcium: 9.6 mg/dL (ref 8.9–10.3)
Creatinine, Ser: 4.98 mg/dL — ABNORMAL HIGH (ref 0.44–1.00)
GFR calc Af Amer: 8 mL/min — ABNORMAL LOW (ref >60)
GFR calc non Af Amer: 7 mL/min — ABNORMAL LOW (ref >60)
Glucose, Bld: 142 mg/dL — ABNORMAL HIGH (ref 65–99)
POTASSIUM: 5.4 mmol/L — AB (ref 3.5–5.1)
SODIUM: 136 mmol/L (ref 135–145)

## 2015-05-03 LAB — APTT: aPTT: 27 seconds (ref 24–36)

## 2015-05-03 MED ORDER — HYDRALAZINE HCL 20 MG/ML IJ SOLN
INTRAMUSCULAR | Status: AC
  Filled 2015-05-03: qty 1

## 2015-05-03 MED ORDER — HYDRALAZINE HCL 20 MG/ML IJ SOLN
10.0000 mg | Freq: Once | INTRAMUSCULAR | Status: AC
  Administered 2015-05-03: 10 mg via INTRAVENOUS

## 2015-05-03 NOTE — ED Notes (Signed)
Fistula clamp applied to left upper arm by Dr. Joni Fears.

## 2015-05-03 NOTE — ED Notes (Signed)
Fistula clamp on left upper arm removed by Dr. Joni Fears.

## 2015-05-03 NOTE — ED Notes (Signed)
Steri-strips applied by Dr. Joni Fears to site. Bleeding is controlled at this time.

## 2015-05-03 NOTE — ED Notes (Addendum)
Per EMS report, patient was at dialysis and staff noted bleeding on left upper arm fistula at beginning of treatment. Staff at dialysis could not control bleeding. Pressure applied by EMS upon arrival. Patient has been hypertensive at dialysis and upon arrival. Patient states she did not take her blood pressure meds today. Dialysis reported that patient does not receive heparin in her treatment, but does take an 81mg  aspirin daily.

## 2015-05-03 NOTE — Discharge Instructions (Signed)
You were seen in the emergency department for bleeding from your dialysis fistula in the left arm. Your initial blood pressure was about 210/50. With decrease of your blood pressure down to 180/60, we were able to get the bleeding to stop. Please continue taking your blood pressure medications and follow-up with your primary care doctor for continued management of your blood pressure. Continue with dialysis as usual. We placed an adhesive strip over the bleeding area. Please leave this in place for 1 week.

## 2015-05-03 NOTE — ED Provider Notes (Signed)
[facility] Emergency Department Provider Note  ____________________________________________  Time seen: 1:20 PM  I have reviewed the triage vital signs and the nursing notes.   HISTORY  Chief Complaint Vascular Access Problem    HPI BRIEA MULLINAX is a 82 y.o. female sent to the ED from dialysis due to bleeding from her dialysis fistula. She was arriving for her routine normal scheduled dialysis with which she has been compliant, but it when they cannulated her left upper extremity AV fistula, they began to be bleeding uncontrollably. They did cannulate but were unable to get the bleeding to stop. She was sent to the ED. She does not take any blood thinners. No recent trauma. No fevers no arm pain or swelling, otherwise in her usual state of health.     Past Medical History  Diagnosis Date  . Anemia     NOS  . Personal history of colonic polyps   . GERD (gastroesophageal reflux disease)   . Hyperlipidemia     takes Simvastatin nightly  . Osteoarthritis   . Osteopenia   . Urinary incontinence   . Pulmonary hypertension ([place])   . Anxiety   . Renal insufficiency   . Coronary artery disease   . Aortic stenosis   . Hyperlipidemia   . Hypertension     takes Metoprolol daily  . Peripheral vascular disease ([place])   . CHF (congestive heart failure) ([place])   . Migraine     hx of  . Constipation   . Diarrhea   . Oligouria   . History of blood transfusion   . Diabetes mellitus     type II;was on Glipizide but has been off x 2mon  . Hypothyroidism     takes Synthroid daily  . Cancer [facility])     Mole on top of head to be removed in July 2013  . UTI (urinary tract infection)   . Dialysis patient [facility])      Patient Active Problem List   Diagnosis Date Noted  . Preventative health care 05/03/2014  . Peripheral vascular disease ([place]) 12/21/2013  . Toe amputation status ([place]) 08/29/2013  . History of recent fall 04/18/2013  . S/P aortic valve  replacement with bioprosthetic valve 11/15/2012  . S/P CABG x 3 11/15/2012  . Anemia of chronic kidney failure 12/22/2010  . Thrombocytopenia ([place]) 12/13/2010  . End stage renal disease on dialysis ([place]) 12/13/2010  . S/P aortic valve replacement 08/11/2010  . Arterial insufficiency-lower 07/25/2010  . Constipation, other cause 06/23/2010  . NEURODERMATITIS 04/08/2009  . Carotid artery stenosis 01/01/2009  . Coronary atherosclerosis 02/11/2007  . AORTIC STENOSIS 10/04/2006  . ANXIETY 05/30/2006  . Pulmonary hypertension ([place]) 05/30/2006  . Hypothyroidism 05/27/2006  . Type 2 diabetes mellitus with renal manifestations, controlled ([place]) 05/27/2006  . Hyperlipidemia 05/27/2006  . Essential hypertension 05/27/2006  . GERD 05/27/2006  . OSTEOARTHRITIS 05/27/2006  . OSTEOPENIA 05/27/2006  . URINARY INCONTINENCE 05/27/2006     Past Surgical History  Procedure Laterality Date  . Abdominal hysterectomy    . Tonsillectomy    . Cardiac catheterization  2000    cad  . Replacement total knee bilateral  05/1998  . Cataract extraction    . Coronary artery bypass graft      od  . Adenosine myoview  2007    benign, EF 69%  . Carotid endarterectomy  2011    Right  . Coronary artery bypass graft  2009  . Aortic valve replacement  2009  .  Amputation-left great toe  7/12    Dr Marlou Sa  . Traumatic amputation of right dip joint of index finger    . Insertion of dialysis catheter  12/18/2010    Procedure: INSERTION OF DIALYSIS CATHETER;  Surgeon: Mal Misty, MD;  Location: [place];  Service: Vascular;  Laterality: Right;  . Av fistula placement  12/22/2010    Procedure: ARTERIOVENOUS (AV) FISTULA CREATION;  Surgeon: Hinda Lenis, MD;  Location: [place];  Service: Vascular;  Laterality: Left;  Creation of Left Brachial-Cephalic Fistula  . Appendectomy    . Eye surgery      BIlateral  . Amputation  06/18/2011    Procedure: AMPUTATION DIGIT;  Surgeon: Newt Minion, MD;  Location: [place];  Service: Orthopedics;  Laterality: Left;  Left 2nd Toe Amputation at MTP Joint     Current Outpatient Rx  Name  Route  Sig  Dispense  Refill  . aspirin EC 81 MG tablet   Oral   Take 81 mg by mouth at bedtime.         . calcium carbonate (TUMS - DOSED IN MG ELEMENTAL CALCIUM) 500 MG chewable tablet   Oral   Chew 1 tablet (200 mg of elemental calcium total) by mouth 3 (three) times daily with meals.         . folic acid (FOLVITE) 0.5 MG tablet   Oral   Take 0.5 tablets (0.5 mg total) by mouth daily.         Marland Kitchen levothyroxine (SYNTHROID, LEVOTHROID) 75 MCG tablet      TAKE ONE TABLET BY MOUTH ONCE DAILY   30 tablet   1   . metoprolol (LOPRESSOR) 50 MG tablet   Oral   Take 1 tablet (50 mg total) by mouth 2 (two) times daily. Patient taking differently: Take 50 mg by mouth at bedtime. Pt. Does not take on dialysis days   180 tablet   3   . pravastatin (PRAVACHOL) 20 MG tablet      TAKE ONE TABLET BY MOUTH IN THE EVENING   90 tablet   3   . vitamin C (VITAMIN C) 500 MG tablet   Oral   Take 1 tablet (500 mg total) by mouth daily.            Allergies Nitrofurantoin; Captopril; Enalapril maleate; Ramipril; Sulfa antibiotics; and Verapamil   Family History  Problem Relation Age of Onset  . Family history unknown: Yes    Social History Social History  Substance Use Topics  . Smoking status: Former Smoker -- 15 years    Types: Cigarettes    Quit date: 01/06/1943  . Smokeless tobacco: Never Used  . Alcohol Use: Yes     Comment: Wine occasionally    Review of Systems  Constitutional:   No fever or chills.  Eyes:   No vision changes.  ENT:   No sore throat. No rhinorrhea. Cardiovascular:   No chest pain. Respiratory:   No dyspnea or cough. Gastrointestinal:   Negative for abdominal pain, vomiting and diarrhea.  Genitourinary:   Negative for dysuria or difficulty urinating. Musculoskeletal:   Negative for focal pain or swelling Neurological:    Negative for headaches 10-point ROS otherwise negative.  ____________________________________________   PHYSICAL EXAM:  VITAL SIGNS: ED Triage Vitals  Enc Vitals Group     BP 05/03/15 1308 211/75 mmHg     Pulse Rate 05/03/15 1308 73     Resp 05/03/15 1308 18  Temp 05/03/15 1308 97.9 F (36.6 C)     Temp Source 05/03/15 1308 Oral     SpO2 05/03/15 1308 97 %     Weight 05/03/15 1308 116 lb 11.2 oz (52.935 kg)     Height --      Head Cir --      Peak Flow --      Pain Score --      Pain Loc --      Pain Edu? --      Excl. in [place]? --     Vital signs reviewed, nursing assessments reviewed.   Constitutional:   Alert and oriented. Well appearing and in no distress. Eyes:   No scleral icterus. No conjunctival pallor. PERRL. EOMI.  No nystagmus. ENT   Head:   Normocephalic and atraumatic.   Nose:   No congestion/rhinnorhea. No septal hematoma   Mouth/Throat:   MMM, no pharyngeal erythema. No peritonsillar mass.    Neck:   No stridor. No SubQ emphysema. No meningismus. Hematological/Lymphatic/Immunilogical:   No cervical lymphadenopathy. Cardiovascular:   RRR. Right upper extremity radial pulse strong. Symmetric DP pulses. Left upper extremity with 1+ radial pulse. There is a left upper extremity proximal AV fistula with strong thrill throughout its course. There is a pinpoint skin defect from needle cannulation through which there is continuous copious hemorrhage..  No murmurs.  Respiratory:   Normal respiratory effort without tachypnea nor retractions. Breath sounds are clear and equal bilaterally. No wheezes/rales/rhonchi. Gastrointestinal:   Soft and nontender. Non distended. There is no CVA tenderness.  No rebound, rigidity, or guarding. Genitourinary:   deferred Musculoskeletal:   Nontender with normal range of motion in all extremities. No joint effusions.  No lower extremity tenderness.  No edema. Neurologic:   Normal speech and language.  CN 2-10  normal. Motor grossly intact. No gross focal neurologic deficits are appreciated.  Skin:    Skin is warm, dry and intact. No rash noted.  No petechiae, purpura, or bullae. Bleeding fistula defect as above  ____________________________________________    LABS (pertinent positives/negatives) (all labs ordered are listed, but only abnormal results are displayed) Labs Reviewed  BASIC METABOLIC PANEL - Abnormal; Notable for the following:    Potassium 5.4 (*)    Chloride 98 (*)    Glucose, Bld 142 (*)    BUN 68 (*)    Creatinine, Ser 4.98 (*)    GFR calc non Af Amer 7 (*)    GFR calc Af Amer 8 (*)    All other components within normal limits  CBC WITH DIFFERENTIAL/PLATELET - Abnormal; Notable for the following:    RBC 3.08 (*)    Hemoglobin 10.3 (*)    HCT 30.9 (*)    MCV 100.6 (*)    RDW 14.8 (*)    Lymphs Abs 0.8 (*)    Monocytes Absolute 1.0 (*)    All other components within normal limits  PROTIME-INR  APTT   ____________________________________________   EKG    ____________________________________________    RADIOLOGY    ____________________________________________   PROCEDURES Control of hemorrhage I personally held direct pressure on the bleeding site that was identifiable over the AV fistula for 10 minutes continuously. Pressure applied to proximal axillary artery as well to minimize blood flow through the fistula. Arm elevated above the chest. I then placed a fistula clamp over the area to maintain pressure while achieving control of the blood pressure.  ____________________________________________   INITIAL IMPRESSION / ASSESSMENT AND PLAN /  ED COURSE  Pertinent labs & imaging results that were available during my care of the patient were reviewed by me and considered in my medical decision making (see chart for details).  Patient well appearing no distress, presents with bleeding cup location of her AV fistula secondary to uncontrolled hypertension.  We give the patient IV hydralazine which did bring her blood pressure down to a more suitable level of 170/50 instead of the 210/70 that it started at. This allowed for the bleeding to stop. He remained hemostatic after removal of the clamp. The clamp was placed at 1:35 PM and removed at 1:44 PM.  Discharge home to follow up with primary care continue dialysis.     ____________________________________________   FINAL CLINICAL IMPRESSION(S) / ED DIAGNOSES  Final diagnoses:  Complication of AV dialysis fistula, initial encounter       Portions of this note were generated with dragon dictation software. Dictation errors may occur despite best attempts at proofreading.   Carrie Mew, MD 05/03/15 1530

## 2015-05-04 DIAGNOSIS — D631 Anemia in chronic kidney disease: Secondary | ICD-10-CM | POA: Diagnosis not present

## 2015-05-04 DIAGNOSIS — N186 End stage renal disease: Secondary | ICD-10-CM | POA: Diagnosis not present

## 2015-05-04 DIAGNOSIS — D509 Iron deficiency anemia, unspecified: Secondary | ICD-10-CM | POA: Diagnosis not present

## 2015-05-05 DIAGNOSIS — I120 Hypertensive chronic kidney disease with stage 5 chronic kidney disease or end stage renal disease: Secondary | ICD-10-CM | POA: Diagnosis not present

## 2015-05-05 DIAGNOSIS — Z992 Dependence on renal dialysis: Secondary | ICD-10-CM | POA: Diagnosis not present

## 2015-05-05 DIAGNOSIS — N186 End stage renal disease: Secondary | ICD-10-CM | POA: Diagnosis not present

## 2015-05-06 DIAGNOSIS — D631 Anemia in chronic kidney disease: Secondary | ICD-10-CM | POA: Diagnosis not present

## 2015-05-06 DIAGNOSIS — N186 End stage renal disease: Secondary | ICD-10-CM | POA: Diagnosis not present

## 2015-05-06 DIAGNOSIS — D509 Iron deficiency anemia, unspecified: Secondary | ICD-10-CM | POA: Diagnosis not present

## 2015-05-07 ENCOUNTER — Encounter: Payer: Self-pay | Admitting: Cardiovascular Disease

## 2015-05-07 ENCOUNTER — Ambulatory Visit (INDEPENDENT_AMBULATORY_CARE_PROVIDER_SITE_OTHER): Payer: Medicare Other | Admitting: Cardiovascular Disease

## 2015-05-07 ENCOUNTER — Telehealth: Payer: Self-pay

## 2015-05-07 VITALS — BP 140/50 | HR 79 | Ht 60.0 in | Wt 112.5 lb

## 2015-05-07 DIAGNOSIS — Z951 Presence of aortocoronary bypass graft: Secondary | ICD-10-CM

## 2015-05-07 DIAGNOSIS — Z952 Presence of prosthetic heart valve: Secondary | ICD-10-CM

## 2015-05-07 DIAGNOSIS — E1122 Type 2 diabetes mellitus with diabetic chronic kidney disease: Secondary | ICD-10-CM

## 2015-05-07 DIAGNOSIS — I6523 Occlusion and stenosis of bilateral carotid arteries: Secondary | ICD-10-CM

## 2015-05-07 DIAGNOSIS — Z992 Dependence on renal dialysis: Secondary | ICD-10-CM

## 2015-05-07 DIAGNOSIS — Z954 Presence of other heart-valve replacement: Secondary | ICD-10-CM | POA: Diagnosis not present

## 2015-05-07 DIAGNOSIS — I272 Other secondary pulmonary hypertension: Secondary | ICD-10-CM

## 2015-05-07 DIAGNOSIS — I10 Essential (primary) hypertension: Secondary | ICD-10-CM

## 2015-05-07 DIAGNOSIS — E785 Hyperlipidemia, unspecified: Secondary | ICD-10-CM

## 2015-05-07 DIAGNOSIS — N186 End stage renal disease: Secondary | ICD-10-CM

## 2015-05-07 DIAGNOSIS — Z9889 Other specified postprocedural states: Secondary | ICD-10-CM

## 2015-05-07 DIAGNOSIS — I25709 Atherosclerosis of coronary artery bypass graft(s), unspecified, with unspecified angina pectoris: Secondary | ICD-10-CM | POA: Diagnosis not present

## 2015-05-07 DIAGNOSIS — I6529 Occlusion and stenosis of unspecified carotid artery: Secondary | ICD-10-CM

## 2015-05-07 DIAGNOSIS — Z953 Presence of xenogenic heart valve: Secondary | ICD-10-CM

## 2015-05-07 MED ORDER — PRAVASTATIN SODIUM 40 MG PO TABS: 40.0000 mg | ORAL_TABLET | Freq: Every evening | ORAL | Status: DC

## 2015-05-07 NOTE — Assessment & Plan Note (Signed)
Repeat echocardiogram ordered to evaluate bioprosthetic valve, last was 2 and half years ago No new symptoms

## 2015-05-07 NOTE — Assessment & Plan Note (Signed)
Blood pressure relatively well-controlled on my recheck. Had dialysis yesterday Diastolic pressure relatively low, down at 50 Suspect she has labile pressures exacerbated by fluid shifts prior to and following hemodialysis. No changes made to her medications. I suspect if we run her numbers low, she will have difficulty with dialysis

## 2015-05-07 NOTE — Assessment & Plan Note (Signed)
Last hemoglobin A1c 6.2 Not requiring significant diabetes medication, diet controlled

## 2015-05-07 NOTE — Assessment & Plan Note (Signed)
Currently with no symptoms of angina. No further workup at this time. Continue current medication regimen. 

## 2015-05-07 NOTE — Assessment & Plan Note (Addendum)
Recommended that she increase her pravastatin up to 40 mg daily to reach goal   Total encounter time more than 25 minutes  Greater than 50% was spent in counseling and coordination of care with the patient

## 2015-05-07 NOTE — Progress Notes (Signed)
Patient ID: [name], female    DOB: 07-10-1920, 82 y.o.   MRN: [id number]  HPI Comments: [name] is a very pleasant 82 year old woman with history of CAD, bypass surgery x3 vessel in 2009 with bioprosthetic aortic valve placed at that time for severe aortic valve stenosis, diet controlled diabetes, previous smoking, moderate bilateral carotid disease, end-stage renal disease on hemodialysis 3 days per week for the past 2 years who presents for routine followup of her  Coronary artery disease  In follow-up today, she reports that she feels well, Has been told that that hemodialysis her blood pressure is elevated. Tends to run low at the end of HD She does not check her blood pressure at home. Otherwise active with no complaints, no chest pain on exertion, no significant shortness of breath or leg edema.  Blood pressure on arrival, 160/68 Blood pressure on my check today was 140/50 (checked twice)  EKG on today's visit shows normal sinus rhythm with rate 79 bpm, left axis deviation, prolonged QTC  Other past medical history History of incontinence Total cholesterol 158, hemoglobin A1c 5.9  hemodialysis on March 18 and had syncope. She was taken to the hospital and diagnosed with urinary tract infection with encephalopathy. She was given antibiotics with improvement of her symptoms. Troponin 0.37 and 0.41. This was felt to be secondary to demand ischemia.  Remote history of MRSA and C. Difficile in December 2014 Echocardiogram in late 2014, well-seated valve   Allergies  Allergen Reactions  . Nitrofurantoin Itching  . Captopril Other (See Comments)    REACTION: unspecified  . Enalapril Maleate Cough  . Ramipril Other (See Comments)    REACTION: unspecified  . Sulfa Antibiotics Other (See Comments)    Reaction unknown  . Verapamil Other (See Comments)    REACTION: unspecified    Outpatient Encounter Prescriptions as of 05/07/2015  Medication Sig  . aspirin EC 81 MG tablet  Take 81 mg by mouth at bedtime.  . calcium carbonate (TUMS - DOSED IN MG ELEMENTAL CALCIUM) 500 MG chewable tablet Chew 1 tablet (200 mg of elemental calcium total) by mouth 3 (three) times daily with meals.  . folic acid (FOLVITE) 0.5 MG tablet Take 0.5 tablets (0.5 mg total) by mouth daily.  Marland Kitchen levothyroxine (SYNTHROID, LEVOTHROID) 75 MCG tablet TAKE ONE TABLET BY MOUTH ONCE DAILY  . metoprolol (LOPRESSOR) 50 MG tablet Take 1 tablet (50 mg total) by mouth 2 (two) times daily. (Patient taking differently: Take 50 mg by mouth at bedtime. Pt. Does not take on dialysis days)  . pravastatin (PRAVACHOL) 40 MG tablet Take 1 tablet (40 mg total) by mouth every evening.  . vitamin C (VITAMIN C) 500 MG tablet Take 1 tablet (500 mg total) by mouth daily.  . [DISCONTINUED] pravastatin (PRAVACHOL) 20 MG tablet TAKE ONE TABLET BY MOUTH IN THE EVENING   No facility-administered encounter medications on file as of 05/07/2015.    Past Medical History  Diagnosis Date  . Anemia     NOS  . Personal history of colonic polyps   . GERD (gastroesophageal reflux disease)   . Hyperlipidemia     takes Simvastatin nightly  . Osteoarthritis   . Osteopenia   . Urinary incontinence   . Pulmonary hypertension ([place])   . Anxiety   . Renal insufficiency   . Coronary artery disease   . Aortic stenosis   . Hyperlipidemia   . Hypertension     takes Metoprolol daily  . Peripheral vascular  disease ([place])   . CHF (congestive heart failure) ([place])   . Migraine     hx of  . Constipation   . Diarrhea   . Oligouria   . History of blood transfusion   . Diabetes mellitus     type II;was on Glipizide but has been off x 42mon  . Hypothyroidism     takes Synthroid daily  . Cancer [facility]     Mole on top of head to be removed in July 2013  . UTI (urinary tract infection)   . Dialysis patient [facility])     Past Surgical History  Procedure Laterality Date  . Abdominal hysterectomy    . Tonsillectomy    . Cardiac  catheterization  2000    cad  . Replacement total knee bilateral  05/1998  . Cataract extraction    . Coronary artery bypass graft      od  . Adenosine myoview  2007    benign, EF 69%  . Carotid endarterectomy  2011    Right  . Coronary artery bypass graft  2009  . Aortic valve replacement  2009  . Amputation-left great toe  7/12    Dr Marlou Sa  . Traumatic amputation of right dip joint of index finger    . Insertion of dialysis catheter  12/18/2010    Procedure: INSERTION OF DIALYSIS CATHETER;  Surgeon: Mal Misty, MD;  Location: [place];  Service: Vascular;  Laterality: Right;  . Av fistula placement  12/22/2010    Procedure: ARTERIOVENOUS (AV) FISTULA CREATION;  Surgeon: Hinda Lenis, MD;  Location: [place];  Service: Vascular;  Laterality: Left;  Creation of Left Brachial-Cephalic Fistula  . Appendectomy    . Eye surgery      BIlateral  . Amputation  06/18/2011    Procedure: AMPUTATION DIGIT;  Surgeon: Newt Minion, MD;  Location: [place];  Service: Orthopedics;  Laterality: Left;  Left 2nd Toe Amputation at MTP Joint    Social History  reports that she quit smoking about 72 years ago. Her smoking use included Cigarettes. She quit after 15 years of use. She has never used smokeless tobacco. She reports that she drinks alcohol. She reports that she does not use illicit drugs.  Family History Family history is unknown by patient.   Review of Systems  Constitutional: Negative.   HENT:       Funny sound in her ears, sounding like a helicopter  Respiratory: Negative.   Cardiovascular: Negative.   Gastrointestinal: Negative.   Musculoskeletal: Positive for gait problem.  Skin: Negative.   Neurological: Negative.   Hematological: Negative.   Psychiatric/Behavioral: Negative.   All other systems reviewed and are negative.   BP 140/50 mmHg  Pulse 79  Ht 5' (1.524 m)  Wt 112 lb 8 oz (51.03 kg)  BMI 21.97 kg/m2  Physical Exam  Constitutional: She is oriented to  person, place, and time. She appears well-developed and well-nourished.  HENT:  Head: Normocephalic.  Nose: Nose normal.  Mouth/Throat: Oropharynx is clear and moist.  Eyes: Conjunctivae are normal. Pupils are equal, round, and reactive to light.  Neck: Normal range of motion. Neck supple. No JVD present.  Cardiovascular: Normal rate, regular rhythm, S1 normal, S2 normal and intact distal pulses.  Exam reveals no gallop and no friction rub.   Murmur heard.  Systolic murmur is present with a grade of 2/6  Pulmonary/Chest: Effort normal and breath sounds normal. No respiratory distress. She has no wheezes. She  has no rales. She exhibits no tenderness.  Abdominal: Soft. Bowel sounds are normal. She exhibits no distension. There is no tenderness.  Musculoskeletal: Normal range of motion. She exhibits no edema or tenderness.  Lymphadenopathy:    She has no cervical adenopathy.  Neurological: She is alert and oriented to person, place, and time. Coordination normal.  Skin: Skin is warm and dry. No rash noted. No erythema.  Psychiatric: She has a normal mood and affect. Her behavior is normal. Judgment and thought content normal.    Assessment and Plan  Nursing note and vitals reviewed.

## 2015-05-07 NOTE — Telephone Encounter (Signed)
Dr. Rockey Situ saw pt today (5/2) and after reviewing her chart, would like for her be set up for a carotid u/s.  Can you call and set her up for this? She is sched for ECHO on 05/23/15. Thank you!

## 2015-05-07 NOTE — Patient Instructions (Addendum)
You are doing well.  We will order an Echocardiogram for AVR Echocardiography is a painless test that uses sound waves to create images of your heart. It provides your doctor with information about the size and shape of your heart and how well your heart's chambers and valves are working. This procedure takes approximately one hour. There are no restrictions for this procedure.  Date & time: _______________________________  Please increase the pravastatin up to 40 mg daily for cholesterol  Please call us if you have new issues that need to be addressed before your next appt.  Your physician wants you to follow-up in: 6 months.  You will receive a reminder letter in the mail two months in advance. If you don't receive a letter, please call our office to schedule the follow-up appointment.  Echocardiogram An echocardiogram, or echocardiography, uses sound waves (ultrasound) to produce an image of your heart. The echocardiogram is simple, painless, obtained within a short period of time, and offers valuable information to your health care provider. The images from an echocardiogram can provide information such as:  Evidence of coronary artery disease (CAD).  Heart size.  Heart muscle function.  Heart valve function.  Aneurysm detection.  Evidence of a past heart attack.  Fluid buildup around the heart.  Heart muscle thickening.  Assess heart valve function. LET [facility] CARE PROVIDER KNOW ABOUT:  Any allergies you have.  All medicines you are taking, including vitamins, herbs, eye drops, creams, and over-the-counter medicines.  Previous problems you or members of your family have had with the use of anesthetics.  Any blood disorders you have.  Previous surgeries you have had.  Medical conditions you have.  Possibility of pregnancy, if this applies. BEFORE THE PROCEDURE  No special preparation is needed. Eat and drink normally.  PROCEDURE   In order to produce an  image of your heart, gel will be applied to your chest and a wand-like tool (transducer) will be moved over your chest. The gel will help transmit the sound waves from the transducer. The sound waves will harmlessly bounce off your heart to allow the heart images to be captured in real-time motion. These images will then be recorded.  You may need an IV to receive a medicine that improves the quality of the pictures. AFTER THE PROCEDURE You may return to your normal schedule including diet, activities, and medicines, unless your health care provider tells you otherwise.   This information is not intended to replace advice given to you by your health care provider. Make sure you discuss any questions you have with your health care provider.   Document Released: 12/20/1999 Document Revised: 01/12/2014 Document Reviewed: 08/29/2012 Elsevier Interactive Patient Education [employer].

## 2015-05-07 NOTE — Assessment & Plan Note (Signed)
40-59% bilateral disease in 2013 History of carotid endarterectomy on the left We will recommend repeat carotid ultrasound

## 2015-05-08 DIAGNOSIS — D631 Anemia in chronic kidney disease: Secondary | ICD-10-CM | POA: Diagnosis not present

## 2015-05-08 DIAGNOSIS — N186 End stage renal disease: Secondary | ICD-10-CM | POA: Diagnosis not present

## 2015-05-08 DIAGNOSIS — D509 Iron deficiency anemia, unspecified: Secondary | ICD-10-CM | POA: Diagnosis not present

## 2015-05-08 NOTE — Telephone Encounter (Signed)
Notified Patients son [name] that Dr. Rockey Situ wanted patient to have carotid ultrasound as well as the echocardiogram and we can do them both on the same day. Patient to come in on 05/23/15 at 1pm and 2pm for both of these to be done. Her son verbalized understanding and had no further questions at this time.

## 2015-05-08 NOTE — Telephone Encounter (Signed)
Added Carotid on that day.  Echo will be at 1pm and Carotid will be at 2pm

## 2015-05-10 ENCOUNTER — Encounter: Payer: Self-pay | Admitting: Internal Medicine

## 2015-05-10 DIAGNOSIS — N186 End stage renal disease: Secondary | ICD-10-CM | POA: Diagnosis not present

## 2015-05-10 DIAGNOSIS — D509 Iron deficiency anemia, unspecified: Secondary | ICD-10-CM | POA: Diagnosis not present

## 2015-05-10 DIAGNOSIS — D631 Anemia in chronic kidney disease: Secondary | ICD-10-CM | POA: Diagnosis not present

## 2015-05-13 DIAGNOSIS — D631 Anemia in chronic kidney disease: Secondary | ICD-10-CM | POA: Diagnosis not present

## 2015-05-13 DIAGNOSIS — N186 End stage renal disease: Secondary | ICD-10-CM | POA: Diagnosis not present

## 2015-05-13 DIAGNOSIS — D509 Iron deficiency anemia, unspecified: Secondary | ICD-10-CM | POA: Diagnosis not present

## 2015-05-15 DIAGNOSIS — D509 Iron deficiency anemia, unspecified: Secondary | ICD-10-CM | POA: Diagnosis not present

## 2015-05-15 DIAGNOSIS — D631 Anemia in chronic kidney disease: Secondary | ICD-10-CM | POA: Diagnosis not present

## 2015-05-15 DIAGNOSIS — N186 End stage renal disease: Secondary | ICD-10-CM | POA: Diagnosis not present

## 2015-05-17 DIAGNOSIS — D631 Anemia in chronic kidney disease: Secondary | ICD-10-CM | POA: Diagnosis not present

## 2015-05-17 DIAGNOSIS — N186 End stage renal disease: Secondary | ICD-10-CM | POA: Diagnosis not present

## 2015-05-17 DIAGNOSIS — D509 Iron deficiency anemia, unspecified: Secondary | ICD-10-CM | POA: Diagnosis not present

## 2015-05-20 DIAGNOSIS — D631 Anemia in chronic kidney disease: Secondary | ICD-10-CM | POA: Diagnosis not present

## 2015-05-20 DIAGNOSIS — N186 End stage renal disease: Secondary | ICD-10-CM | POA: Diagnosis not present

## 2015-05-20 DIAGNOSIS — D509 Iron deficiency anemia, unspecified: Secondary | ICD-10-CM | POA: Diagnosis not present

## 2015-05-21 ENCOUNTER — Encounter: Payer: Self-pay | Admitting: Internal Medicine

## 2015-05-21 ENCOUNTER — Ambulatory Visit (INDEPENDENT_AMBULATORY_CARE_PROVIDER_SITE_OTHER): Payer: Medicare Other | Admitting: Internal Medicine

## 2015-05-21 VITALS — BP 160/90 | HR 70 | Temp 97.6°F | Wt 113.0 lb

## 2015-05-21 DIAGNOSIS — E1121 Type 2 diabetes mellitus with diabetic nephropathy: Secondary | ICD-10-CM | POA: Diagnosis not present

## 2015-05-21 DIAGNOSIS — I272 Other secondary pulmonary hypertension: Secondary | ICD-10-CM

## 2015-05-21 DIAGNOSIS — I739 Peripheral vascular disease, unspecified: Secondary | ICD-10-CM | POA: Diagnosis not present

## 2015-05-21 DIAGNOSIS — N186 End stage renal disease: Secondary | ICD-10-CM | POA: Diagnosis not present

## 2015-05-21 DIAGNOSIS — I779 Disorder of arteries and arterioles, unspecified: Secondary | ICD-10-CM | POA: Diagnosis not present

## 2015-05-21 DIAGNOSIS — Z992 Dependence on renal dialysis: Secondary | ICD-10-CM

## 2015-05-21 DIAGNOSIS — D696 Thrombocytopenia, unspecified: Secondary | ICD-10-CM

## 2015-05-21 DIAGNOSIS — Z89422 Acquired absence of other left toe(s): Secondary | ICD-10-CM

## 2015-05-21 DIAGNOSIS — IMO0002 Reserved for concepts with insufficient information to code with codable children: Secondary | ICD-10-CM

## 2015-05-21 LAB — T4, FREE: Free T4: 0.99 ng/dL (ref 0.60–1.60)

## 2015-05-21 LAB — LIPID PANEL
CHOLESTEROL: 167 mg/dL (ref 0–200)
HDL: 43.8 mg/dL (ref >39.00)
LDL Cholesterol: 95 mg/dL (ref 0–99)
NonHDL: 122.96
TRIGLYCERIDES: 142 mg/dL (ref 0.0–149.0)
Total CHOL/HDL Ratio: 4
VLDL: 28.4 mg/dL (ref 0.0–40.0)

## 2015-05-21 LAB — HEMOGLOBIN A1C: HEMOGLOBIN A1C: 5.8 % (ref 4.6–6.5)

## 2015-05-21 LAB — TSH: TSH: 1.79 u[IU]/mL (ref 0.35–4.50)

## 2015-05-21 NOTE — Progress Notes (Signed)
Subjective:    Patient ID: [name], female    DOB: [birth year], 82 y.o.   MRN: [id number]  HPI Here for follow up of diabetes and multiple other medical conditions  Doing fairly well Still having some issues with dialysis They thought she needed more metoprolol--- Dr Rockey Situ didn't agree Generally tolerating the dialysis  Not checking sugars No apparent problems  No chest pain No SOB No palpitations Occasional hypotension in dialysis-- otherwise no dizziness  Still drives-- but not to dialysis (uses Lucianne Lei usually and son) Does all her instrumental ADLs still  Current Outpatient Prescriptions on File Prior to Visit  Medication Sig Dispense Refill  . aspirin EC 81 MG tablet Take 81 mg by mouth at bedtime.    . calcium carbonate (TUMS - DOSED IN MG ELEMENTAL CALCIUM) 500 MG chewable tablet Chew 1 tablet (200 mg of elemental calcium total) by mouth 3 (three) times daily with meals.    . folic acid (FOLVITE) 0.5 MG tablet Take 0.5 tablets (0.5 mg total) by mouth daily.    Marland Kitchen levothyroxine (SYNTHROID, LEVOTHROID) 75 MCG tablet TAKE ONE TABLET BY MOUTH ONCE DAILY 30 tablet 1  . metoprolol (LOPRESSOR) 50 MG tablet Take 1 tablet (50 mg total) by mouth 2 (two) times daily. (Patient taking differently: Take 50 mg by mouth at bedtime. Pt. Does not take on dialysis days) 180 tablet 3  . pravastatin (PRAVACHOL) 40 MG tablet Take 1 tablet (40 mg total) by mouth every evening. 90 tablet 4  . vitamin C (VITAMIN C) 500 MG tablet Take 1 tablet (500 mg total) by mouth daily.     No current facility-administered medications on file prior to visit.    Allergies  Allergen Reactions  . Nitrofurantoin Itching  . Captopril Other (See Comments)    REACTION: unspecified  . Enalapril Maleate Cough  . Ramipril Other (See Comments)    REACTION: unspecified  . Sulfa Antibiotics Other (See Comments)    Reaction unknown  . Verapamil Other (See Comments)    REACTION: unspecified    Past Medical  History  Diagnosis Date  . Anemia     NOS  . Personal history of colonic polyps   . GERD (gastroesophageal reflux disease)   . Hyperlipidemia     takes Simvastatin nightly  . Osteoarthritis   . Osteopenia   . Urinary incontinence   . Pulmonary hypertension ([place])   . Anxiety   . Renal insufficiency   . Coronary artery disease   . Aortic stenosis   . Hyperlipidemia   . Hypertension     takes Metoprolol daily  . Peripheral vascular disease ([place])   . CHF (congestive heart failure) ([place])   . Migraine     hx of  . Constipation   . Diarrhea   . Oligouria   . History of blood transfusion   . Diabetes mellitus     type II;was on Glipizide but has been off x 4mon  . Hypothyroidism     takes Synthroid daily  . Cancer [facility])     Mole on top of head to be removed in July 2013  . UTI (urinary tract infection)   . Dialysis patient [facility])     Past Surgical History  Procedure Laterality Date  . Abdominal hysterectomy    . Tonsillectomy    . Cardiac catheterization  2000    cad  . Replacement total knee bilateral  05/1998  . Cataract extraction    . Coronary artery bypass  graft      od  . Adenosine myoview  2007    benign, EF 69%  . Carotid endarterectomy  2011    Right  . Coronary artery bypass graft  2009  . Aortic valve replacement  2009  . Amputation-left great toe  7/12    Dr Marlou Sa  . Traumatic amputation of right dip joint of index finger    . Insertion of dialysis catheter  12/18/2010    Procedure: INSERTION OF DIALYSIS CATHETER;  Surgeon: Mal Misty, MD;  Location: [place];  Service: Vascular;  Laterality: Right;  . Av fistula placement  12/22/2010    Procedure: ARTERIOVENOUS (AV) FISTULA CREATION;  Surgeon: Hinda Lenis, MD;  Location: [place];  Service: Vascular;  Laterality: Left;  Creation of Left Brachial-Cephalic Fistula  . Appendectomy    . Eye surgery      BIlateral  . Amputation  06/18/2011    Procedure: AMPUTATION DIGIT;  Surgeon: Newt Minion, MD;   Location: [place];  Service: Orthopedics;  Laterality: Left;  Left 2nd Toe Amputation at MTP Joint    Family History  Problem Relation Age of Onset  . Family history unknown: Yes    Social History   Social History  . Marital Status: Widowed    Spouse Name: [name]  . Number of Children: 1  . Years of Education: [name]   Occupational History  . 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 Smoking status: Former Smoker -- 15 years    Types: Cigarettes    Quit date: 01/06/1943  . Smokeless tobacco: Never Used  . Alcohol Use: Yes     Comment: Wine occasionally  . Drug Use: No  . Sexual Activity: No   Other Topics Concern  . Not on file   Social History Narrative   Son Evlyn Clines to make decisions for her   Discussed DNR and she requests --done 05/03/14   No tube feeds if cognitively unaware   Review of Systems Lost both hearing aides---needs more Some work at dentist Sleeps okay--some bad nights Appetite is good at times--not great other times Weight is stable No headaches Feet feel cold at times--no pain    Objective:   Physical Exam  Neck: Normal range of motion. No thyromegaly present.  Cardiovascular: Normal rate, regular rhythm and normal heart sounds.  Exam reveals no gallop.   No murmur heard. Feet warm but without pulses  Pulmonary/Chest: Effort normal and breath sounds normal. No respiratory distress. She has no wheezes. She has no rales.  Abdominal: Soft. There is no tenderness.  Musculoskeletal: She exhibits no edema.  Lymphadenopathy:    She has no cervical adenopathy.  Skin:  No foot lesions Left 1st and 2nd toes absent--site clean and dry  Psychiatric: She has a normal mood and affect. Her behavior is normal.          Assessment & Plan:

## 2015-05-21 NOTE — Assessment & Plan Note (Signed)
No symptoms and not enough for intervention

## 2015-05-21 NOTE — Assessment & Plan Note (Signed)
No meds Will recheck labs

## 2015-05-21 NOTE — Assessment & Plan Note (Signed)
No abnormal bleeding (other than with graft access at dialysis)

## 2015-05-21 NOTE — Assessment & Plan Note (Signed)
Breathing is okay No edema

## 2015-05-21 NOTE — Assessment & Plan Note (Signed)
Doing okay on dialysis 

## 2015-05-21 NOTE — Assessment & Plan Note (Signed)
No new foot problems

## 2015-05-21 NOTE — Progress Notes (Signed)
Pre visit review using our clinic review tool, if applicable. No additional management support is needed unless otherwise documented below in the visit note. 

## 2015-05-21 NOTE — Assessment & Plan Note (Signed)
No pain or foot lesions

## 2015-05-22 DIAGNOSIS — D509 Iron deficiency anemia, unspecified: Secondary | ICD-10-CM | POA: Diagnosis not present

## 2015-05-22 DIAGNOSIS — N186 End stage renal disease: Secondary | ICD-10-CM | POA: Diagnosis not present

## 2015-05-22 DIAGNOSIS — D631 Anemia in chronic kidney disease: Secondary | ICD-10-CM | POA: Diagnosis not present

## 2015-05-23 ENCOUNTER — Ambulatory Visit: Payer: Medicare Other

## 2015-05-23 ENCOUNTER — Other Ambulatory Visit: Payer: Self-pay

## 2015-05-23 ENCOUNTER — Encounter: Payer: Self-pay | Admitting: *Deleted

## 2015-05-23 ENCOUNTER — Ambulatory Visit (INDEPENDENT_AMBULATORY_CARE_PROVIDER_SITE_OTHER): Payer: Medicare Other

## 2015-05-23 DIAGNOSIS — I272 Other secondary pulmonary hypertension: Secondary | ICD-10-CM

## 2015-05-23 DIAGNOSIS — Z954 Presence of other heart-valve replacement: Secondary | ICD-10-CM

## 2015-05-23 DIAGNOSIS — Z9889 Other specified postprocedural states: Secondary | ICD-10-CM | POA: Diagnosis not present

## 2015-05-23 DIAGNOSIS — I25709 Atherosclerosis of coronary artery bypass graft(s), unspecified, with unspecified angina pectoris: Secondary | ICD-10-CM | POA: Diagnosis not present

## 2015-05-23 DIAGNOSIS — Z952 Presence of prosthetic heart valve: Secondary | ICD-10-CM

## 2015-05-23 DIAGNOSIS — I6529 Occlusion and stenosis of unspecified carotid artery: Secondary | ICD-10-CM | POA: Diagnosis not present

## 2015-05-24 DIAGNOSIS — N186 End stage renal disease: Secondary | ICD-10-CM | POA: Diagnosis not present

## 2015-05-24 DIAGNOSIS — D631 Anemia in chronic kidney disease: Secondary | ICD-10-CM | POA: Diagnosis not present

## 2015-05-24 DIAGNOSIS — D509 Iron deficiency anemia, unspecified: Secondary | ICD-10-CM | POA: Diagnosis not present

## 2015-05-27 DIAGNOSIS — N186 End stage renal disease: Secondary | ICD-10-CM | POA: Diagnosis not present

## 2015-05-27 DIAGNOSIS — D509 Iron deficiency anemia, unspecified: Secondary | ICD-10-CM | POA: Diagnosis not present

## 2015-05-27 DIAGNOSIS — D631 Anemia in chronic kidney disease: Secondary | ICD-10-CM | POA: Diagnosis not present

## 2015-05-29 DIAGNOSIS — N186 End stage renal disease: Secondary | ICD-10-CM | POA: Diagnosis not present

## 2015-05-29 DIAGNOSIS — D631 Anemia in chronic kidney disease: Secondary | ICD-10-CM | POA: Diagnosis not present

## 2015-05-29 DIAGNOSIS — D509 Iron deficiency anemia, unspecified: Secondary | ICD-10-CM | POA: Diagnosis not present

## 2015-05-31 DIAGNOSIS — D509 Iron deficiency anemia, unspecified: Secondary | ICD-10-CM | POA: Diagnosis not present

## 2015-05-31 DIAGNOSIS — N186 End stage renal disease: Secondary | ICD-10-CM | POA: Diagnosis not present

## 2015-05-31 DIAGNOSIS — D631 Anemia in chronic kidney disease: Secondary | ICD-10-CM | POA: Diagnosis not present

## 2015-06-03 DIAGNOSIS — D509 Iron deficiency anemia, unspecified: Secondary | ICD-10-CM | POA: Diagnosis not present

## 2015-06-03 DIAGNOSIS — N186 End stage renal disease: Secondary | ICD-10-CM | POA: Diagnosis not present

## 2015-06-03 DIAGNOSIS — D631 Anemia in chronic kidney disease: Secondary | ICD-10-CM | POA: Diagnosis not present

## 2015-06-05 DIAGNOSIS — N186 End stage renal disease: Secondary | ICD-10-CM | POA: Diagnosis not present

## 2015-06-05 DIAGNOSIS — Z992 Dependence on renal dialysis: Secondary | ICD-10-CM | POA: Diagnosis not present

## 2015-06-05 DIAGNOSIS — D509 Iron deficiency anemia, unspecified: Secondary | ICD-10-CM | POA: Diagnosis not present

## 2015-06-05 DIAGNOSIS — I120 Hypertensive chronic kidney disease with stage 5 chronic kidney disease or end stage renal disease: Secondary | ICD-10-CM | POA: Diagnosis not present

## 2015-06-05 DIAGNOSIS — D631 Anemia in chronic kidney disease: Secondary | ICD-10-CM | POA: Diagnosis not present

## 2015-06-07 DIAGNOSIS — N186 End stage renal disease: Secondary | ICD-10-CM | POA: Diagnosis not present

## 2015-06-07 DIAGNOSIS — D631 Anemia in chronic kidney disease: Secondary | ICD-10-CM | POA: Diagnosis not present

## 2015-06-07 DIAGNOSIS — D509 Iron deficiency anemia, unspecified: Secondary | ICD-10-CM | POA: Diagnosis not present

## 2015-06-07 DIAGNOSIS — E119 Type 2 diabetes mellitus without complications: Secondary | ICD-10-CM | POA: Diagnosis not present

## 2015-06-10 DIAGNOSIS — E119 Type 2 diabetes mellitus without complications: Secondary | ICD-10-CM | POA: Diagnosis not present

## 2015-06-10 DIAGNOSIS — D631 Anemia in chronic kidney disease: Secondary | ICD-10-CM | POA: Diagnosis not present

## 2015-06-10 DIAGNOSIS — N186 End stage renal disease: Secondary | ICD-10-CM | POA: Diagnosis not present

## 2015-06-10 DIAGNOSIS — D509 Iron deficiency anemia, unspecified: Secondary | ICD-10-CM | POA: Diagnosis not present

## 2015-06-12 DIAGNOSIS — D631 Anemia in chronic kidney disease: Secondary | ICD-10-CM | POA: Diagnosis not present

## 2015-06-12 DIAGNOSIS — E119 Type 2 diabetes mellitus without complications: Secondary | ICD-10-CM | POA: Diagnosis not present

## 2015-06-12 DIAGNOSIS — D509 Iron deficiency anemia, unspecified: Secondary | ICD-10-CM | POA: Diagnosis not present

## 2015-06-12 DIAGNOSIS — N186 End stage renal disease: Secondary | ICD-10-CM | POA: Diagnosis not present

## 2015-06-14 DIAGNOSIS — D509 Iron deficiency anemia, unspecified: Secondary | ICD-10-CM | POA: Diagnosis not present

## 2015-06-14 DIAGNOSIS — N186 End stage renal disease: Secondary | ICD-10-CM | POA: Diagnosis not present

## 2015-06-14 DIAGNOSIS — E119 Type 2 diabetes mellitus without complications: Secondary | ICD-10-CM | POA: Diagnosis not present

## 2015-06-14 DIAGNOSIS — D631 Anemia in chronic kidney disease: Secondary | ICD-10-CM | POA: Diagnosis not present

## 2015-06-17 DIAGNOSIS — N186 End stage renal disease: Secondary | ICD-10-CM | POA: Diagnosis not present

## 2015-06-17 DIAGNOSIS — D631 Anemia in chronic kidney disease: Secondary | ICD-10-CM | POA: Diagnosis not present

## 2015-06-17 DIAGNOSIS — E119 Type 2 diabetes mellitus without complications: Secondary | ICD-10-CM | POA: Diagnosis not present

## 2015-06-17 DIAGNOSIS — D509 Iron deficiency anemia, unspecified: Secondary | ICD-10-CM | POA: Diagnosis not present

## 2015-06-19 DIAGNOSIS — N186 End stage renal disease: Secondary | ICD-10-CM | POA: Diagnosis not present

## 2015-06-19 DIAGNOSIS — D509 Iron deficiency anemia, unspecified: Secondary | ICD-10-CM | POA: Diagnosis not present

## 2015-06-19 DIAGNOSIS — E119 Type 2 diabetes mellitus without complications: Secondary | ICD-10-CM | POA: Diagnosis not present

## 2015-06-19 DIAGNOSIS — D631 Anemia in chronic kidney disease: Secondary | ICD-10-CM | POA: Diagnosis not present

## 2015-06-21 DIAGNOSIS — D509 Iron deficiency anemia, unspecified: Secondary | ICD-10-CM | POA: Diagnosis not present

## 2015-06-21 DIAGNOSIS — D631 Anemia in chronic kidney disease: Secondary | ICD-10-CM | POA: Diagnosis not present

## 2015-06-21 DIAGNOSIS — N186 End stage renal disease: Secondary | ICD-10-CM | POA: Diagnosis not present

## 2015-06-21 DIAGNOSIS — E119 Type 2 diabetes mellitus without complications: Secondary | ICD-10-CM | POA: Diagnosis not present

## 2015-06-24 DIAGNOSIS — E119 Type 2 diabetes mellitus without complications: Secondary | ICD-10-CM | POA: Diagnosis not present

## 2015-06-24 DIAGNOSIS — N186 End stage renal disease: Secondary | ICD-10-CM | POA: Diagnosis not present

## 2015-06-24 DIAGNOSIS — D509 Iron deficiency anemia, unspecified: Secondary | ICD-10-CM | POA: Diagnosis not present

## 2015-06-24 DIAGNOSIS — D631 Anemia in chronic kidney disease: Secondary | ICD-10-CM | POA: Diagnosis not present

## 2015-06-26 DIAGNOSIS — D631 Anemia in chronic kidney disease: Secondary | ICD-10-CM | POA: Diagnosis not present

## 2015-06-26 DIAGNOSIS — E119 Type 2 diabetes mellitus without complications: Secondary | ICD-10-CM | POA: Diagnosis not present

## 2015-06-26 DIAGNOSIS — D509 Iron deficiency anemia, unspecified: Secondary | ICD-10-CM | POA: Diagnosis not present

## 2015-06-26 DIAGNOSIS — N186 End stage renal disease: Secondary | ICD-10-CM | POA: Diagnosis not present

## 2015-06-28 DIAGNOSIS — D631 Anemia in chronic kidney disease: Secondary | ICD-10-CM | POA: Diagnosis not present

## 2015-06-28 DIAGNOSIS — E119 Type 2 diabetes mellitus without complications: Secondary | ICD-10-CM | POA: Diagnosis not present

## 2015-06-28 DIAGNOSIS — D509 Iron deficiency anemia, unspecified: Secondary | ICD-10-CM | POA: Diagnosis not present

## 2015-06-28 DIAGNOSIS — N186 End stage renal disease: Secondary | ICD-10-CM | POA: Diagnosis not present

## 2015-07-01 DIAGNOSIS — D631 Anemia in chronic kidney disease: Secondary | ICD-10-CM | POA: Diagnosis not present

## 2015-07-01 DIAGNOSIS — N186 End stage renal disease: Secondary | ICD-10-CM | POA: Diagnosis not present

## 2015-07-01 DIAGNOSIS — D509 Iron deficiency anemia, unspecified: Secondary | ICD-10-CM | POA: Diagnosis not present

## 2015-07-01 DIAGNOSIS — E119 Type 2 diabetes mellitus without complications: Secondary | ICD-10-CM | POA: Diagnosis not present

## 2015-07-03 DIAGNOSIS — D631 Anemia in chronic kidney disease: Secondary | ICD-10-CM | POA: Diagnosis not present

## 2015-07-03 DIAGNOSIS — N186 End stage renal disease: Secondary | ICD-10-CM | POA: Diagnosis not present

## 2015-07-03 DIAGNOSIS — D509 Iron deficiency anemia, unspecified: Secondary | ICD-10-CM | POA: Diagnosis not present

## 2015-07-03 DIAGNOSIS — E119 Type 2 diabetes mellitus without complications: Secondary | ICD-10-CM | POA: Diagnosis not present

## 2015-07-04 ENCOUNTER — Other Ambulatory Visit: Payer: Self-pay | Admitting: Internal Medicine

## 2015-07-05 DIAGNOSIS — D631 Anemia in chronic kidney disease: Secondary | ICD-10-CM | POA: Diagnosis not present

## 2015-07-05 DIAGNOSIS — N186 End stage renal disease: Secondary | ICD-10-CM | POA: Diagnosis not present

## 2015-07-05 DIAGNOSIS — Z992 Dependence on renal dialysis: Secondary | ICD-10-CM | POA: Diagnosis not present

## 2015-07-05 DIAGNOSIS — E119 Type 2 diabetes mellitus without complications: Secondary | ICD-10-CM | POA: Diagnosis not present

## 2015-07-05 DIAGNOSIS — D509 Iron deficiency anemia, unspecified: Secondary | ICD-10-CM | POA: Diagnosis not present

## 2015-07-05 DIAGNOSIS — I120 Hypertensive chronic kidney disease with stage 5 chronic kidney disease or end stage renal disease: Secondary | ICD-10-CM | POA: Diagnosis not present

## 2015-07-08 ENCOUNTER — Emergency Department
Admission: EM | Admit: 2015-07-08 | Discharge: 2015-07-08 | Disposition: A | Discharge status: Home / Self Care | Payer: Medicare Other | Source: Home / Self Care | Attending: Emergency Medicine | Admitting: Emergency Medicine

## 2015-07-08 ENCOUNTER — Emergency Department
Admission: EM | Admit: 2015-07-08 | Discharge: 2015-07-08 | Disposition: A | Discharge status: Home / Self Care | Payer: Medicare Other | Attending: Emergency Medicine | Admitting: Emergency Medicine

## 2015-07-08 DIAGNOSIS — Z79899 Other long term (current) drug therapy: Secondary | ICD-10-CM

## 2015-07-08 DIAGNOSIS — Z7982 Long term (current) use of aspirin: Secondary | ICD-10-CM | POA: Insufficient documentation

## 2015-07-08 DIAGNOSIS — Z87891 Personal history of nicotine dependence: Secondary | ICD-10-CM

## 2015-07-08 DIAGNOSIS — R58 Hemorrhage, not elsewhere classified: Secondary | ICD-10-CM | POA: Diagnosis not present

## 2015-07-08 DIAGNOSIS — Z8679 Personal history of other diseases of the circulatory system: Secondary | ICD-10-CM | POA: Diagnosis not present

## 2015-07-08 DIAGNOSIS — Z86718 Personal history of other venous thrombosis and embolism: Secondary | ICD-10-CM | POA: Insufficient documentation

## 2015-07-08 DIAGNOSIS — N186 End stage renal disease: Secondary | ICD-10-CM | POA: Insufficient documentation

## 2015-07-08 DIAGNOSIS — E785 Hyperlipidemia, unspecified: Secondary | ICD-10-CM | POA: Insufficient documentation

## 2015-07-08 DIAGNOSIS — I2510 Atherosclerotic heart disease of native coronary artery without angina pectoris: Secondary | ICD-10-CM | POA: Insufficient documentation

## 2015-07-08 DIAGNOSIS — Z992 Dependence on renal dialysis: Secondary | ICD-10-CM

## 2015-07-08 DIAGNOSIS — E039 Hypothyroidism, unspecified: Secondary | ICD-10-CM

## 2015-07-08 DIAGNOSIS — M1990 Unspecified osteoarthritis, unspecified site: Secondary | ICD-10-CM

## 2015-07-08 DIAGNOSIS — I509 Heart failure, unspecified: Secondary | ICD-10-CM | POA: Insufficient documentation

## 2015-07-08 DIAGNOSIS — Z952 Presence of prosthetic heart valve: Secondary | ICD-10-CM | POA: Insufficient documentation

## 2015-07-08 DIAGNOSIS — E1122 Type 2 diabetes mellitus with diabetic chronic kidney disease: Secondary | ICD-10-CM | POA: Insufficient documentation

## 2015-07-08 DIAGNOSIS — Y828 Other medical devices associated with adverse incidents: Secondary | ICD-10-CM | POA: Diagnosis not present

## 2015-07-08 DIAGNOSIS — T82838A Hemorrhage of vascular prosthetic devices, implants and grafts, initial encounter: Secondary | ICD-10-CM | POA: Insufficient documentation

## 2015-07-08 DIAGNOSIS — Z008 Encounter for other general examination: Secondary | ICD-10-CM | POA: Diagnosis not present

## 2015-07-08 DIAGNOSIS — Z96653 Presence of artificial knee joint, bilateral: Secondary | ICD-10-CM | POA: Insufficient documentation

## 2015-07-08 DIAGNOSIS — D509 Iron deficiency anemia, unspecified: Secondary | ICD-10-CM | POA: Diagnosis not present

## 2015-07-08 DIAGNOSIS — Z951 Presence of aortocoronary bypass graft: Secondary | ICD-10-CM

## 2015-07-08 DIAGNOSIS — I132 Hypertensive heart and chronic kidney disease with heart failure and with stage 5 chronic kidney disease, or end stage renal disease: Secondary | ICD-10-CM | POA: Insufficient documentation

## 2015-07-08 DIAGNOSIS — I120 Hypertensive chronic kidney disease with stage 5 chronic kidney disease or end stage renal disease: Secondary | ICD-10-CM | POA: Diagnosis not present

## 2015-07-08 DIAGNOSIS — R4182 Altered mental status, unspecified: Secondary | ICD-10-CM | POA: Diagnosis not present

## 2015-07-08 DIAGNOSIS — D631 Anemia in chronic kidney disease: Secondary | ICD-10-CM | POA: Diagnosis not present

## 2015-07-08 LAB — CBC WITH DIFFERENTIAL/PLATELET
BASOS ABS: 0.1 10*3/uL (ref 0–0.1)
BASOS PCT: 1 %
EOS PCT: 3 %
Eosinophils Absolute: 0.2 10*3/uL (ref 0–0.7)
HCT: 33.2 % — ABNORMAL LOW (ref 35.0–47.0)
Hemoglobin: 11 g/dL — ABNORMAL LOW (ref 12.0–16.0)
LYMPHS PCT: 9 %
Lymphs Abs: 0.7 10*3/uL — ABNORMAL LOW (ref 1.0–3.6)
MCH: 34.4 pg — ABNORMAL HIGH (ref 26.0–34.0)
MCHC: 33.2 g/dL (ref 32.0–36.0)
MCV: 103.4 fL — ABNORMAL HIGH (ref 80.0–100.0)
MONO ABS: 0.8 10*3/uL (ref 0.2–0.9)
Monocytes Relative: 10 %
NEUTROS ABS: 5.7 10*3/uL (ref 1.4–6.5)
Neutrophils Relative %: 77 %
PLATELETS: 177 10*3/uL (ref 150–440)
RBC: 3.21 MIL/uL — ABNORMAL LOW (ref 3.80–5.20)
RDW: 15.1 % — AB (ref 11.5–14.5)
WBC: 7.4 10*3/uL (ref 3.6–11.0)

## 2015-07-08 LAB — COMPREHENSIVE METABOLIC PANEL
ALBUMIN: 4 g/dL (ref 3.5–5.0)
ALT: 15 U/L (ref 14–54)
AST: 23 U/L (ref 15–41)
Alkaline Phosphatase: 96 U/L (ref 38–126)
Anion gap: 15 (ref 5–15)
BUN: 54 mg/dL — AB (ref 6–20)
CHLORIDE: 101 mmol/L (ref 101–111)
CO2: 22 mmol/L (ref 22–32)
Calcium: 9.3 mg/dL (ref 8.9–10.3)
Creatinine, Ser: 5.83 mg/dL — ABNORMAL HIGH (ref 0.44–1.00)
GFR calc Af Amer: 6 mL/min — ABNORMAL LOW (ref >60)
GFR calc non Af Amer: 6 mL/min — ABNORMAL LOW (ref >60)
GLUCOSE: 154 mg/dL — AB (ref 65–99)
POTASSIUM: 5.6 mmol/L — AB (ref 3.5–5.1)
Sodium: 138 mmol/L (ref 135–145)
Total Bilirubin: 0.9 mg/dL (ref 0.3–1.2)
Total Protein: 7.4 g/dL (ref 6.5–8.1)

## 2015-07-08 LAB — CBC
HEMATOCRIT: 33.5 % — AB (ref 35.0–47.0)
Hemoglobin: 11.3 g/dL — ABNORMAL LOW (ref 12.0–16.0)
MCH: 34.5 pg — ABNORMAL HIGH (ref 26.0–34.0)
MCHC: 33.8 g/dL (ref 32.0–36.0)
MCV: 102.1 fL — AB (ref 80.0–100.0)
Platelets: 178 10*3/uL (ref 150–440)
RBC: 3.28 MIL/uL — ABNORMAL LOW (ref 3.80–5.20)
RDW: 14.9 % — AB (ref 11.5–14.5)
WBC: 6.3 10*3/uL (ref 3.6–11.0)

## 2015-07-08 MED ORDER — METOPROLOL TARTRATE 50 MG PO TABS
50.0000 mg | ORAL_TABLET | Freq: Once | ORAL | Status: AC
  Administered 2015-07-08: 50 mg via ORAL
  Filled 2015-07-08: qty 1

## 2015-07-08 NOTE — ED Notes (Signed)
Sandbag removed by Dr. Reita Cliche about 15 minutes ago. No bleeding noted at this time. Dressing is not saturated. Pt is able to move arm up and down and bend it with no bleeding noted through dressing. Will re-assess in a few minutes to make sure movement did not cause bleeding.

## 2015-07-08 NOTE — ED Notes (Signed)
Pt sent from dialysis with bleeding from the dialysis site, states uncontrolled bleeding.Marland Kitchenother site still has access.

## 2015-07-08 NOTE — ED Notes (Signed)
Dr. Lord at bedside.  

## 2015-07-08 NOTE — ED Provider Notes (Signed)
[facility] Emergency Department Provider Note        Time seen: ----------------------------------------- 12:29 PM on 07/08/2015 -----------------------------------------    I have reviewed the triage vital signs and the nursing notes.   HISTORY  Chief Complaint Medical Clearance    HPI [name] is a 82 y.o. female brought the ER by EMS. She was at dialysis and the staff called EMS due to altered mental status. Reportedly she showed up without a shoe and the staff thought this was unlike her. She arrives alert and oriented with no complaints and is upset that she was brought here. EMS did not note any acute neurologic changes. There was concerned about possible facial drooping at dialysis.   Past Medical History  Diagnosis Date  . Anemia     NOS  . Personal history of colonic polyps   . GERD (gastroesophageal reflux disease)   . Hyperlipidemia     takes Simvastatin nightly  . Osteoarthritis   . Osteopenia   . Urinary incontinence   . Pulmonary hypertension ([place])   . Anxiety   . Renal insufficiency   . Coronary artery disease   . Aortic stenosis   . Hyperlipidemia   . Hypertension     takes Metoprolol daily  . Peripheral vascular disease ([place])   . CHF (congestive heart failure) ([place])   . Migraine     hx of  . Constipation   . Diarrhea   . Oligouria   . History of blood transfusion   . Diabetes mellitus     type II;was on Glipizide but has been off x 66mon  . Hypothyroidism     takes Synthroid daily  . Cancer [facility])     Mole on top of head to be removed in July 2013  . UTI (urinary tract infection)   . Dialysis patient [facility])     Patient Active Problem List   Diagnosis Date Noted  . Preventative health care 05/03/2014  . Peripheral vascular disease ([place]) 12/21/2013  . Toe amputation status ([place]) 08/29/2013  . History of recent fall 04/18/2013  . S/P aortic valve replacement with bioprosthetic valve 11/15/2012  . S/P CABG x 3  11/15/2012  . Anemia of chronic kidney failure 12/22/2010  . Thrombocytopenia ([place]) 12/13/2010  . End stage renal disease on dialysis ([place]) 12/13/2010  . S/P aortic valve replacement 08/11/2010  . Arterial insufficiency-lower 07/25/2010  . Constipation, other cause 06/23/2010  . NEURODERMATITIS 04/08/2009  . Carotid arterial disease ([place]) 01/01/2009  . Coronary atherosclerosis 02/11/2007  . AORTIC STENOSIS 10/04/2006  . ANXIETY 05/30/2006  . Pulmonary hypertension ([place]) 05/30/2006  . Hypothyroidism 05/27/2006  . Type 2 diabetes mellitus with renal manifestations, controlled ([place]) 05/27/2006  . Hyperlipidemia 05/27/2006  . Essential hypertension 05/27/2006  . GERD 05/27/2006  . OSTEOARTHRITIS 05/27/2006  . OSTEOPENIA 05/27/2006  . URINARY INCONTINENCE 05/27/2006    Past Surgical History  Procedure Laterality Date  . Abdominal hysterectomy    . Tonsillectomy    . Cardiac catheterization  2000    cad  . Replacement total knee bilateral  05/1998  . Cataract extraction    . Coronary artery bypass graft      od  . Adenosine myoview  2007    benign, EF 69%  . Carotid endarterectomy  2011    Right  . Coronary artery bypass graft  2009  . Aortic valve replacement  2009  . Amputation-left great toe  7/12    Dr Marlou Sa  .  Traumatic amputation of right dip joint of index finger    . Insertion of dialysis catheter  12/18/2010    Procedure: INSERTION OF DIALYSIS CATHETER;  Surgeon: Mal Misty, MD;  Location: [place];  Service: Vascular;  Laterality: Right;  . Av fistula placement  12/22/2010    Procedure: ARTERIOVENOUS (AV) FISTULA CREATION;  Surgeon: Hinda Lenis, MD;  Location: [place];  Service: Vascular;  Laterality: Left;  Creation of Left Brachial-Cephalic Fistula  . Appendectomy    . Eye surgery      BIlateral  . Amputation  06/18/2011    Procedure: AMPUTATION DIGIT;  Surgeon: Newt Minion, MD;  Location: [place];  Service: Orthopedics;  Laterality: Left;  Left 2nd Toe  Amputation at MTP Joint    Allergies Nitrofurantoin; Captopril; Enalapril maleate; Ramipril; Sulfa antibiotics; and Verapamil  Social History Social History  Substance Use Topics  . Smoking status: Former Smoker -- 15 years    Types: Cigarettes    Quit date: 01/06/1943  . Smokeless tobacco: Never Used  . Alcohol Use: Yes     Comment: Wine occasionally    Review of Systems Constitutional: Negative for fever. Cardiovascular: Negative for chest pain. Respiratory: Negative for shortness of breath. Gastrointestinal: Negative for abdominal pain, vomiting and diarrhea. Genitourinary: Negative for dysuria. Musculoskeletal: Negative for back pain. Skin: Negative for rash. Neurological: Concern for possible altered mental status   ____________________________________________   PHYSICAL EXAM:  VITAL SIGNS: ED Triage Vitals  Enc Vitals Group     BP 07/08/15 1206 220/65 mmHg     Pulse Rate 07/08/15 1206 77     Resp 07/08/15 1206 16     Temp 07/08/15 1205 98.6 F (37 C)     Temp src --      SpO2 07/08/15 1206 99 %     Weight --      Height --      Head Cir --      Peak Flow --      Pain Score --      Pain Loc --      Pain Edu? --      Excl. in [place]? --     Constitutional: Alert and oriented. Well appearing and in no distress.Hard of hearing Eyes: Conjunctivae are normal. PERRL. Normal extraocular movements. ENT   Head: Normocephalic and atraumatic.   Nose: No congestion/rhinnorhea.   Mouth/Throat: Mucous membranes are moist.   Neck: No stridor. Cardiovascular: Normal rate, regular rhythm. No murmurs, rubs, or gallops. Respiratory: Normal respiratory effort without tachypnea nor retractions. Breath sounds are clear and equal bilaterally. No wheezes/rales/rhonchi. Gastrointestinal: Soft and nontender. Normal bowel sounds Musculoskeletal: Nontender with normal range of motion in all extremities. No lower extremity tenderness nor edema. Neurologic:  Normal  speech and language. No gross focal neurologic deficits are appreciated. Normal strength Skin:  Skin is warm, dry and intact. No rash noted. Psychiatric: Mood and affect are normal. Speech and behavior are normal.  ____________________________________________  ED COURSE:  Pertinent labs & imaging results that were available during my care of the patient were reviewed by me and considered in my medical decision making (see chart for details). Patient is no acute distress, does not exhibit any neurologic symptoms at this time. We will check basic labs and reevaluate. ____________________________________________    LABS (pertinent positives/negatives)  Labs Reviewed  CBC WITH DIFFERENTIAL/PLATELET - Abnormal; Notable for the following:    RBC 3.21 (*)    Hemoglobin 11.0 (*)  HCT 33.2 (*)    MCV 103.4 (*)    MCH 34.4 (*)    RDW 15.1 (*)    Lymphs Abs 0.7 (*)    All other components within normal limits  COMPREHENSIVE METABOLIC PANEL - Abnormal; Notable for the following:    Potassium 5.6 (*)    Glucose, Bld 154 (*)    BUN 54 (*)    Creatinine, Ser 5.83 (*)    GFR calc non Af Amer 6 (*)    GFR calc Af Amer 6 (*)    All other components within normal limits   ____________________________________________  FINAL ASSESSMENT AND PLAN  Medical screening exam, end-stage renal disease  Plan: Patient with labs as dictated above. Patient is in no acute distress, does seem anxious. She appears medically stable for dialysis at this time. I do not think she needs to be admitted or that she had a TIA earlier.   Earleen Newport, MD   Note: This dictation was prepared with Dragon dictation. Any transcriptional errors that result from this process are unintentional   Earleen Newport, MD 07/08/15 1304

## 2015-07-08 NOTE — ED Provider Notes (Signed)
[facility] Emergency Department Provider Note   ____________________________________________  Time seen: Immediately upon emergency department arrival I have reviewed the triage vital signs and the triage nursing note.  HISTORY  Chief Complaint Vascular Access Problem   Historian Patient  HPI [name] is a 82 y.o. female with a left forearm fistula for dialysis, is presenting here from dialysis after needle was the accessed and patient has had spurting blood. Apparently they had tried to obtain control over the last 45 minutes, but were unable to stop the bleeding.  The patient is not complaining of dizziness, chest pain, trouble breathing, numbness, or confusion.   She takes a baby aspirin.  Family member arrived and stated that she has had problems with the access in the past with the same provider at the facility.  She is treated for high blood pressure, but does not take her metoprolol on the mornings of dialysis. She has not had her metoprolol today.     Past Medical History  Diagnosis Date  . Anemia     NOS  . Personal history of colonic polyps   . GERD (gastroesophageal reflux disease)   . Hyperlipidemia     takes Simvastatin nightly  . Osteoarthritis   . Osteopenia   . Urinary incontinence   . Pulmonary hypertension ([place])   . Anxiety   . Renal insufficiency   . Coronary artery disease   . Aortic stenosis   . Hyperlipidemia   . Hypertension     takes Metoprolol daily  . Peripheral vascular disease ([place])   . CHF (congestive heart failure) ([place])   . Migraine     hx of  . Constipation   . Diarrhea   . Oligouria   . History of blood transfusion   . Diabetes mellitus     type II;was on Glipizide but has been off x 53mon  . Hypothyroidism     takes Synthroid daily  . Cancer [facility])     Mole on top of head to be removed in July 2013  . UTI (urinary tract infection)   . Dialysis patient [facility])     Patient Active Problem List    Diagnosis Date Noted  . Preventative health care 05/03/2014  . Peripheral vascular disease ([place]) 12/21/2013  . Toe amputation status ([place]) 08/29/2013  . History of recent fall 04/18/2013  . S/P aortic valve replacement with bioprosthetic valve 11/15/2012  . S/P CABG x 3 11/15/2012  . Anemia of chronic kidney failure 12/22/2010  . Thrombocytopenia ([place]) 12/13/2010  . End stage renal disease on dialysis ([place]) 12/13/2010  . S/P aortic valve replacement 08/11/2010  . Arterial insufficiency-lower 07/25/2010  . Constipation, other cause 06/23/2010  . NEURODERMATITIS 04/08/2009  . Carotid arterial disease ([place]) 01/01/2009  . Coronary atherosclerosis 02/11/2007  . AORTIC STENOSIS 10/04/2006  . ANXIETY 05/30/2006  . Pulmonary hypertension ([place]) 05/30/2006  . Hypothyroidism 05/27/2006  . Type 2 diabetes mellitus with renal manifestations, controlled ([place]) 05/27/2006  . Hyperlipidemia 05/27/2006  . Essential hypertension 05/27/2006  . GERD 05/27/2006  . OSTEOARTHRITIS 05/27/2006  . OSTEOPENIA 05/27/2006  . URINARY INCONTINENCE 05/27/2006    Past Surgical History  Procedure Laterality Date  . Abdominal hysterectomy    . Tonsillectomy    . Cardiac catheterization  2000    cad  . Replacement total knee bilateral  05/1998  . Cataract extraction    . Coronary artery bypass graft      od  . Adenosine myoview  2007  benign, EF 69%  . Carotid endarterectomy  2011    Right  . Coronary artery bypass graft  2009  . Aortic valve replacement  2009  . Amputation-left great toe  7/12    Dr Marlou Sa  . Traumatic amputation of right dip joint of index finger    . Insertion of dialysis catheter  12/18/2010    Procedure: INSERTION OF DIALYSIS CATHETER;  Surgeon: Mal Misty, MD;  Location: [facility];  Service: Vascular;  Laterality: Right;  . Av fistula placement  12/22/2010    Procedure: ARTERIOVENOUS (AV) FISTULA CREATION;  Surgeon: Hinda Lenis, MD;  Location: [facility];  Service:  Vascular;  Laterality: Left;  Creation of Left Brachial-Cephalic Fistula  . Appendectomy    . Eye surgery      BIlateral  . Amputation  06/18/2011    Procedure: AMPUTATION DIGIT;  Surgeon: Newt Minion, MD;  Location: [facility];  Service: Orthopedics;  Laterality: Left;  Left 2nd Toe Amputation at MTP Joint    Current Outpatient Rx  Name  Route  Sig  Dispense  Refill  . aspirin EC 81 MG tablet   Oral   Take 81 mg by mouth at bedtime.         . calcium carbonate (TUMS - DOSED IN MG ELEMENTAL CALCIUM) 500 MG chewable tablet   Oral   Chew 1 tablet (200 mg of elemental calcium total) by mouth 3 (three) times daily with meals.         . folic acid (FOLVITE) 0.5 MG tablet   Oral   Take 0.5 tablets (0.5 mg total) by mouth daily.         Marland Kitchen levothyroxine (SYNTHROID, LEVOTHROID) 75 MCG tablet      TAKE ONE TABLET BY MOUTH ONCE DAILY   30 tablet   11   . metoprolol (LOPRESSOR) 50 MG tablet   Oral   Take 1 tablet (50 mg total) by mouth 2 (two) times daily. Patient taking differently: Take 50 mg by mouth at bedtime. Pt. Does not take on dialysis days   180 tablet   3   . pravastatin (PRAVACHOL) 40 MG tablet   Oral   Take 1 tablet (40 mg total) by mouth every evening.   90 tablet   4   . vitamin C (VITAMIN C) 500 MG tablet   Oral   Take 1 tablet (500 mg total) by mouth daily.           Allergies Nitrofurantoin; Captopril; Enalapril maleate; Ramipril; Sulfa antibiotics; and Verapamil  Family History  Problem Relation Age of Onset  . Family history unknown: Yes    Social History Social History  Substance Use Topics  . Smoking status: Former Smoker -- 15 years    Types: Cigarettes    Quit date: 01/06/1943  . Smokeless tobacco: Never Used  . Alcohol Use: Yes     Comment: Wine occasionally    Review of Systems  Constitutional: Negative for fever. Eyes: Negative for visual changes. ENT: Negative for sore throat. Cardiovascular: Negative for chest  pain. Respiratory: Negative for shortness of breath. Gastrointestinal: Negative for abdominal pain, vomiting and diarrhea. Genitourinary: Negative for dysuria. Musculoskeletal: Negative for back pain. Skin: Negative for rash. Neurological: Negative for headache. 10 point Review of Systems otherwise negative ____________________________________________   PHYSICAL EXAM:  VITAL SIGNS: ED Triage Vitals  Enc Vitals Group     BP 07/08/15 1800 226/74 mmHg     Pulse Rate 07/08/15 1809 81  Resp 07/08/15 1809 18     Temp 07/08/15 1809 98 F (36.7 C)     Temp Source 07/08/15 1809 Oral     SpO2 07/08/15 1809 100 %     Weight 07/08/15 1809 113 lb (51.256 kg)     Height 07/08/15 1809 5\' 1"  (1.549 m)     Head Cir --      Peak Flow --      Pain Score 07/08/15 1942 0     Pain Loc --      Pain Edu? --      Excl. in [place]? --      Constitutional: Alert and oriented. Well appearing and in no distress. HEENT   Head: Normocephalic and atraumatic.      Eyes: Conjunctivae are normal. PERRL. Normal extraocular movements.      Ears:         Nose: No congestion/rhinnorhea.   Mouth/Throat: Mucous membranes are moist.   Neck: No stridor. Cardiovascular/Chest: Normal rate, regular rhythm.  No murmurs, rubs, or gallops. Respiratory: Normal respiratory effort without tachypnea nor retractions. Breath sounds are clear and equal bilaterally. No wheezes/rales/rhonchi. Gastrointestinal: Soft. No distention, no guarding, no rebound. Nontender.   Genitourinary/rectal:Deferred Musculoskeletal: Left upper arm fistula with palpable thrill. Distal dialysis access needle in place. Proximal needle site with arterial spurting. Normal distal pulses and cap refill. Neurologic:  Normal speech and language. No gross or focal neurologic deficits are appreciated. Skin:  Skin is warm, dry and intact. No rash noted. Psychiatric: Calm and cooperative.  ____________________________________________   EKG I,  Lisa Roca, MD, the attending physician have personally viewed and interpreted all ECGs.  None ____________________________________________  LABS (pertinent positives/negatives)  Labs Reviewed  CBC - Abnormal; Notable for the following:    RBC 3.28 (*)    Hemoglobin 11.3 (*)    HCT 33.5 (*)    MCV 102.1 (*)    MCH 34.5 (*)    RDW 14.9 (*)    All other components within normal limits    ____________________________________________  RADIOLOGY All Xrays were viewed by me. Imaging interpreted by Radiologist.None __________________________________________  PROCEDURES  Procedure(s) performed: None  Critical Care performed: CRITICAL CARE Performed by: Lisa Roca   Total critical care time: 30 minutes  Critical care time was exclusive of separately billable procedures and treating other patients.  Critical care was necessary to treat or prevent imminent or life-threatening deterioration.  Critical care was time spent personally by me on the following activities: development of treatment plan with patient and/or surrogate as well as nursing, discussions with consultants, evaluation of patient's response to treatment, examination of patient, obtaining history from patient or surrogate, ordering and performing treatments and interventions, ordering and review of laboratory studies, ordering and review of radiographic studies, pulse oximetry and re-evaluation of patient's condition.   ____________________________________________   ED COURSE / ASSESSMENT AND PLAN  Pertinent labs & imaging results that were available during my care of the patient were reviewed by me and considered in my medical decision making (see chart for details).  Patient was brought in with a saturated dressing to the left upper extremity where his reported that one of her dialysis access sites was bleeding.  I did take down the gauze and dressing which was found to be saturated, and uncovered the  bleeding site which is one small puncture that was spurting. I was able to place one finger directly over the site to obtain hemostatic control.    I placed Surgicel and  arm clamp which was left in place for about 30 minutes and then rechecked in without removing the gauze, there still appeared to be no active bleeding. An Ace wrap was applied.  Patient was sent down to dialysis to have the second needle removed.  Hemoglobin [id number], certainly it may be lower than this given the acute bleeding, however patient is not having any symptomatic issues in terms of low blood pressure, dizziness, or chest pain.  I think she is okay for discharge home.  She is hypertensive here, but has missed her metoprolol. She was given her home dose of metoprolol 50 mg.  Patient came back from dialysis and they D accessed her other needle site. Upon patient ready for discharge the additional site started bleeding. Control was obtained very quickly, no significant blood loss.  Local pressure on top of Surgicel followed by Ace wrap of both sites and sandbag for a period of observation.  Patient will be observed for another half hour or so and if maintains stable okay for discharge with my prepared instructions. Patient care transferred to Dr. Burlene Arnt at shift change 9 PM.   CONSULTATIONS:   None   Patient / Family / Caregiver informed of clinical course, medical decision-making process, and agree with plan.   I discussed return precautions, follow-up instructions, and discharged instructions with patient and/or family.   ___________________________________________   FINAL CLINICAL IMPRESSION(S) / ED DIAGNOSES   Final diagnoses:  Bleeding from dialysis shunt, initial encounter [facility])              Note: This dictation was prepared with Dragon dictation. Any transcriptional errors that result from this process are unintentional   Lisa Roca, MD 07/08/15 2038

## 2015-07-08 NOTE — ED Notes (Signed)
MD at bedside. 

## 2015-07-08 NOTE — Discharge Instructions (Signed)
Pressure was applied to your bleeding fistula site, and an Ace wrap was placed with good bleeding control.  Leave in place until seen at dialysis on Wednesday.  Return to the emergency department for any worsening condition including numbness or tingling of the fingers, or any recurrent bleeding.

## 2015-07-08 NOTE — ED Notes (Signed)
Dr. Reita Cliche went to check status of pt's bandages and pt's arm began to bleed. Dr. Reita Cliche held pressure, called this RN to bedside to hold pressure. New gauze applied, new ace wrap applied. Sandbag applied to arm. Pt verbalized arm was in a comfortable position. Pt verbalized understanding of having to wait longer and not be discharged at this time. Family member went to get food.

## 2015-07-08 NOTE — ED Notes (Signed)
Pt transported to dialysis to de-activate fistula via stretcher.

## 2015-07-08 NOTE — Discharge Instructions (Signed)
End-Stage Kidney Disease The kidneys are two organs that lie on either side of the spine between the middle of the back and the front of the abdomen. The kidneys:   Remove wastes and extra water from the blood.   Produce important hormones. These help keep bones strong, regulate blood pressure, and help create red blood cells.   Balance the fluids and chemicals in the blood and tissues. End-stage kidney disease occurs when the kidneys are so damaged that they cannot do their job. When the kidneys cannot do their job, life-threatening problems occur. The body cannot stay clean and strong without the help of the kidneys. In end-stage kidney disease, the kidneys cannot get better.You need a new kidney or treatments to do some of the work healthy kidneys do in order to stay alive. CAUSES  End-stage kidney disease usually occurs when a long-lasting (chronic) kidney disease gets worse. It may also occur after the kidneys are suddenly damaged (acute kidney injury).  SYMPTOMS   Swelling (edema) of the legs, ankles, or feet.   Tiredness (lethargy).   Nausea or vomiting.   Confusion.   Problems with urination, such as:   Decreased urine production.   Frequent urination, especially at night.   Frequent accidents in children who are potty trained.   Muscle twitches and cramps.   Persistent itchiness.   Loss of appetite.   Headaches.   Abnormally dark or light skin.   Numbness in the hands or feet.   Easy bruising.   Frequent hiccups.   Menstruation stops. DIAGNOSIS  Your health care provider will measure your blood pressure and take some tests. These may include:   Urine tests.   Blood tests.   Imaging tests, such as:   An ultrasound exam.   Computed tomography (CT).  A kidney biopsy. TREATMENT  There are two treatments for end-stage kidney disease:   A procedure that removes toxic wastes from the body (dialysis).   Receiving a new kidney  (kidney transplant). Both of these treatments have serious risks and consequences. Your health care provider will help you determine which treatment is best for you based on your health, age, and other factors. In addition to having dialysis or a kidney transplant, you may need to take medicines to control high blood pressure (hypertension) and cholesterol and to decrease phosphorus levels in your blood.  HOME CARE INSTRUCTIONS  Follow your prescribed diet.   Take medicines only as directed by your health care provider.   Do not take any new medicines (prescription, over-the-counter, or nutritional supplements) unless approved by your health care provider. Many medicines can worsen your kidney damage or need to have the dose adjusted.   Keep all follow-up visits as directed by your health care provider. MAKE SURE YOU:  Understand these instructions.  Will watch your condition.  Will get help right away if you are not doing well or get worse.   This information is not intended to replace advice given to you by your health care provider. Make sure you discuss any questions you have with your health care provider.   Document Released: 03/14/2003 Document Revised: 01/12/2014 Document Reviewed: 08/21/2011 Elsevier Interactive Patient Education 2016 [employer]  

## 2015-07-08 NOTE — ED Notes (Signed)
PT BIB by EMS, pt was at dialysis and staff called EMS due to "AMS" staff reports she showed up missing a shoe and per the staff this is unlike the pt. Pt is A&O X4 and no deficits noted. Pt received no dialysis today. Staff also reports the pts " mouth was drawn up" but per EMS no deficits noted

## 2015-07-10 DIAGNOSIS — N186 End stage renal disease: Secondary | ICD-10-CM | POA: Diagnosis not present

## 2015-07-10 DIAGNOSIS — D509 Iron deficiency anemia, unspecified: Secondary | ICD-10-CM | POA: Diagnosis not present

## 2015-07-10 DIAGNOSIS — D631 Anemia in chronic kidney disease: Secondary | ICD-10-CM | POA: Diagnosis not present

## 2015-07-11 DIAGNOSIS — H538 Other visual disturbances: Secondary | ICD-10-CM | POA: Diagnosis not present

## 2015-07-12 DIAGNOSIS — D631 Anemia in chronic kidney disease: Secondary | ICD-10-CM | POA: Diagnosis not present

## 2015-07-12 DIAGNOSIS — N186 End stage renal disease: Secondary | ICD-10-CM | POA: Diagnosis not present

## 2015-07-12 DIAGNOSIS — D509 Iron deficiency anemia, unspecified: Secondary | ICD-10-CM | POA: Diagnosis not present

## 2015-07-15 DIAGNOSIS — D509 Iron deficiency anemia, unspecified: Secondary | ICD-10-CM | POA: Diagnosis not present

## 2015-07-15 DIAGNOSIS — N186 End stage renal disease: Secondary | ICD-10-CM | POA: Diagnosis not present

## 2015-07-15 DIAGNOSIS — D631 Anemia in chronic kidney disease: Secondary | ICD-10-CM | POA: Diagnosis not present

## 2015-07-17 DIAGNOSIS — N186 End stage renal disease: Secondary | ICD-10-CM | POA: Diagnosis not present

## 2015-07-17 DIAGNOSIS — D631 Anemia in chronic kidney disease: Secondary | ICD-10-CM | POA: Diagnosis not present

## 2015-07-17 DIAGNOSIS — D509 Iron deficiency anemia, unspecified: Secondary | ICD-10-CM | POA: Diagnosis not present

## 2015-07-19 DIAGNOSIS — N186 End stage renal disease: Secondary | ICD-10-CM | POA: Diagnosis not present

## 2015-07-19 DIAGNOSIS — D509 Iron deficiency anemia, unspecified: Secondary | ICD-10-CM | POA: Diagnosis not present

## 2015-07-19 DIAGNOSIS — D631 Anemia in chronic kidney disease: Secondary | ICD-10-CM | POA: Diagnosis not present

## 2015-07-22 DIAGNOSIS — D631 Anemia in chronic kidney disease: Secondary | ICD-10-CM | POA: Diagnosis not present

## 2015-07-22 DIAGNOSIS — D509 Iron deficiency anemia, unspecified: Secondary | ICD-10-CM | POA: Diagnosis not present

## 2015-07-22 DIAGNOSIS — N186 End stage renal disease: Secondary | ICD-10-CM | POA: Diagnosis not present

## 2015-07-24 DIAGNOSIS — N186 End stage renal disease: Secondary | ICD-10-CM | POA: Diagnosis not present

## 2015-07-24 DIAGNOSIS — D631 Anemia in chronic kidney disease: Secondary | ICD-10-CM | POA: Diagnosis not present

## 2015-07-24 DIAGNOSIS — D509 Iron deficiency anemia, unspecified: Secondary | ICD-10-CM | POA: Diagnosis not present

## 2015-07-26 DIAGNOSIS — D509 Iron deficiency anemia, unspecified: Secondary | ICD-10-CM | POA: Diagnosis not present

## 2015-07-26 DIAGNOSIS — D631 Anemia in chronic kidney disease: Secondary | ICD-10-CM | POA: Diagnosis not present

## 2015-07-26 DIAGNOSIS — N186 End stage renal disease: Secondary | ICD-10-CM | POA: Diagnosis not present

## 2015-07-29 DIAGNOSIS — D509 Iron deficiency anemia, unspecified: Secondary | ICD-10-CM | POA: Diagnosis not present

## 2015-07-29 DIAGNOSIS — N186 End stage renal disease: Secondary | ICD-10-CM | POA: Diagnosis not present

## 2015-07-29 DIAGNOSIS — D631 Anemia in chronic kidney disease: Secondary | ICD-10-CM | POA: Diagnosis not present

## 2015-07-31 DIAGNOSIS — D509 Iron deficiency anemia, unspecified: Secondary | ICD-10-CM | POA: Diagnosis not present

## 2015-07-31 DIAGNOSIS — N186 End stage renal disease: Secondary | ICD-10-CM | POA: Diagnosis not present

## 2015-07-31 DIAGNOSIS — D631 Anemia in chronic kidney disease: Secondary | ICD-10-CM | POA: Diagnosis not present

## 2015-08-02 DIAGNOSIS — D509 Iron deficiency anemia, unspecified: Secondary | ICD-10-CM | POA: Diagnosis not present

## 2015-08-02 DIAGNOSIS — N186 End stage renal disease: Secondary | ICD-10-CM | POA: Diagnosis not present

## 2015-08-02 DIAGNOSIS — D631 Anemia in chronic kidney disease: Secondary | ICD-10-CM | POA: Diagnosis not present

## 2015-08-05 DIAGNOSIS — D631 Anemia in chronic kidney disease: Secondary | ICD-10-CM | POA: Diagnosis not present

## 2015-08-05 DIAGNOSIS — Z992 Dependence on renal dialysis: Secondary | ICD-10-CM | POA: Diagnosis not present

## 2015-08-05 DIAGNOSIS — N186 End stage renal disease: Secondary | ICD-10-CM | POA: Diagnosis not present

## 2015-08-05 DIAGNOSIS — I120 Hypertensive chronic kidney disease with stage 5 chronic kidney disease or end stage renal disease: Secondary | ICD-10-CM | POA: Diagnosis not present

## 2015-08-05 DIAGNOSIS — D509 Iron deficiency anemia, unspecified: Secondary | ICD-10-CM | POA: Diagnosis not present

## 2015-08-06 DIAGNOSIS — H1011 Acute atopic conjunctivitis, right eye: Secondary | ICD-10-CM | POA: Diagnosis not present

## 2015-08-07 ENCOUNTER — Encounter: Payer: Self-pay | Admitting: Internal Medicine

## 2015-08-07 DIAGNOSIS — D631 Anemia in chronic kidney disease: Secondary | ICD-10-CM | POA: Diagnosis not present

## 2015-08-07 DIAGNOSIS — E875 Hyperkalemia: Secondary | ICD-10-CM | POA: Diagnosis not present

## 2015-08-07 DIAGNOSIS — D509 Iron deficiency anemia, unspecified: Secondary | ICD-10-CM | POA: Diagnosis not present

## 2015-08-07 DIAGNOSIS — N186 End stage renal disease: Secondary | ICD-10-CM | POA: Diagnosis not present

## 2015-08-09 DIAGNOSIS — N186 End stage renal disease: Secondary | ICD-10-CM | POA: Diagnosis not present

## 2015-08-09 DIAGNOSIS — D631 Anemia in chronic kidney disease: Secondary | ICD-10-CM | POA: Diagnosis not present

## 2015-08-09 DIAGNOSIS — D509 Iron deficiency anemia, unspecified: Secondary | ICD-10-CM | POA: Diagnosis not present

## 2015-08-09 DIAGNOSIS — E875 Hyperkalemia: Secondary | ICD-10-CM | POA: Diagnosis not present

## 2015-08-12 DIAGNOSIS — D631 Anemia in chronic kidney disease: Secondary | ICD-10-CM | POA: Diagnosis not present

## 2015-08-12 DIAGNOSIS — D509 Iron deficiency anemia, unspecified: Secondary | ICD-10-CM | POA: Diagnosis not present

## 2015-08-12 DIAGNOSIS — N186 End stage renal disease: Secondary | ICD-10-CM | POA: Diagnosis not present

## 2015-08-12 DIAGNOSIS — E875 Hyperkalemia: Secondary | ICD-10-CM | POA: Diagnosis not present

## 2015-08-14 DIAGNOSIS — E875 Hyperkalemia: Secondary | ICD-10-CM | POA: Diagnosis not present

## 2015-08-14 DIAGNOSIS — D631 Anemia in chronic kidney disease: Secondary | ICD-10-CM | POA: Diagnosis not present

## 2015-08-14 DIAGNOSIS — D509 Iron deficiency anemia, unspecified: Secondary | ICD-10-CM | POA: Diagnosis not present

## 2015-08-14 DIAGNOSIS — N186 End stage renal disease: Secondary | ICD-10-CM | POA: Diagnosis not present

## 2015-08-16 DIAGNOSIS — D631 Anemia in chronic kidney disease: Secondary | ICD-10-CM | POA: Diagnosis not present

## 2015-08-16 DIAGNOSIS — D509 Iron deficiency anemia, unspecified: Secondary | ICD-10-CM | POA: Diagnosis not present

## 2015-08-16 DIAGNOSIS — E875 Hyperkalemia: Secondary | ICD-10-CM | POA: Diagnosis not present

## 2015-08-16 DIAGNOSIS — N186 End stage renal disease: Secondary | ICD-10-CM | POA: Diagnosis not present

## 2015-08-19 DIAGNOSIS — N186 End stage renal disease: Secondary | ICD-10-CM | POA: Diagnosis not present

## 2015-08-19 DIAGNOSIS — D509 Iron deficiency anemia, unspecified: Secondary | ICD-10-CM | POA: Diagnosis not present

## 2015-08-19 DIAGNOSIS — E875 Hyperkalemia: Secondary | ICD-10-CM | POA: Diagnosis not present

## 2015-08-19 DIAGNOSIS — D631 Anemia in chronic kidney disease: Secondary | ICD-10-CM | POA: Diagnosis not present

## 2015-08-20 DIAGNOSIS — H1011 Acute atopic conjunctivitis, right eye: Secondary | ICD-10-CM | POA: Diagnosis not present

## 2015-08-21 DIAGNOSIS — D631 Anemia in chronic kidney disease: Secondary | ICD-10-CM | POA: Diagnosis not present

## 2015-08-21 DIAGNOSIS — D509 Iron deficiency anemia, unspecified: Secondary | ICD-10-CM | POA: Diagnosis not present

## 2015-08-21 DIAGNOSIS — E875 Hyperkalemia: Secondary | ICD-10-CM | POA: Diagnosis not present

## 2015-08-21 DIAGNOSIS — N186 End stage renal disease: Secondary | ICD-10-CM | POA: Diagnosis not present

## 2015-08-23 DIAGNOSIS — D631 Anemia in chronic kidney disease: Secondary | ICD-10-CM | POA: Diagnosis not present

## 2015-08-23 DIAGNOSIS — D509 Iron deficiency anemia, unspecified: Secondary | ICD-10-CM | POA: Diagnosis not present

## 2015-08-23 DIAGNOSIS — N186 End stage renal disease: Secondary | ICD-10-CM | POA: Diagnosis not present

## 2015-08-23 DIAGNOSIS — E875 Hyperkalemia: Secondary | ICD-10-CM | POA: Diagnosis not present

## 2015-08-26 ENCOUNTER — Other Ambulatory Visit
Admission: RE | Admit: 2015-08-26 | Discharge: 2015-08-26 | Disposition: A | Discharge status: Home / Self Care | Payer: Medicare Other | Source: Ambulatory Visit | Attending: Nephrology | Admitting: Nephrology

## 2015-08-26 DIAGNOSIS — E875 Hyperkalemia: Secondary | ICD-10-CM | POA: Diagnosis not present

## 2015-08-26 DIAGNOSIS — N186 End stage renal disease: Secondary | ICD-10-CM | POA: Diagnosis not present

## 2015-08-26 DIAGNOSIS — D509 Iron deficiency anemia, unspecified: Secondary | ICD-10-CM | POA: Diagnosis not present

## 2015-08-26 DIAGNOSIS — D631 Anemia in chronic kidney disease: Secondary | ICD-10-CM | POA: Diagnosis not present

## 2015-08-26 LAB — POTASSIUM: POTASSIUM: 5.3 mmol/L — AB (ref 3.5–5.1)

## 2015-08-28 DIAGNOSIS — E875 Hyperkalemia: Secondary | ICD-10-CM | POA: Diagnosis not present

## 2015-08-28 DIAGNOSIS — N186 End stage renal disease: Secondary | ICD-10-CM | POA: Diagnosis not present

## 2015-08-28 DIAGNOSIS — D509 Iron deficiency anemia, unspecified: Secondary | ICD-10-CM | POA: Diagnosis not present

## 2015-08-28 DIAGNOSIS — D631 Anemia in chronic kidney disease: Secondary | ICD-10-CM | POA: Diagnosis not present

## 2015-08-30 DIAGNOSIS — D509 Iron deficiency anemia, unspecified: Secondary | ICD-10-CM | POA: Diagnosis not present

## 2015-08-30 DIAGNOSIS — N186 End stage renal disease: Secondary | ICD-10-CM | POA: Diagnosis not present

## 2015-08-30 DIAGNOSIS — E875 Hyperkalemia: Secondary | ICD-10-CM | POA: Diagnosis not present

## 2015-08-30 DIAGNOSIS — D631 Anemia in chronic kidney disease: Secondary | ICD-10-CM | POA: Diagnosis not present

## 2015-09-02 DIAGNOSIS — D631 Anemia in chronic kidney disease: Secondary | ICD-10-CM | POA: Diagnosis not present

## 2015-09-02 DIAGNOSIS — D509 Iron deficiency anemia, unspecified: Secondary | ICD-10-CM | POA: Diagnosis not present

## 2015-09-02 DIAGNOSIS — E875 Hyperkalemia: Secondary | ICD-10-CM | POA: Diagnosis not present

## 2015-09-02 DIAGNOSIS — N186 End stage renal disease: Secondary | ICD-10-CM | POA: Diagnosis not present

## 2015-09-04 DIAGNOSIS — D509 Iron deficiency anemia, unspecified: Secondary | ICD-10-CM | POA: Diagnosis not present

## 2015-09-04 DIAGNOSIS — D631 Anemia in chronic kidney disease: Secondary | ICD-10-CM | POA: Diagnosis not present

## 2015-09-04 DIAGNOSIS — N186 End stage renal disease: Secondary | ICD-10-CM | POA: Diagnosis not present

## 2015-09-04 DIAGNOSIS — E875 Hyperkalemia: Secondary | ICD-10-CM | POA: Diagnosis not present

## 2015-09-05 DIAGNOSIS — N186 End stage renal disease: Secondary | ICD-10-CM | POA: Diagnosis not present

## 2015-09-05 DIAGNOSIS — I120 Hypertensive chronic kidney disease with stage 5 chronic kidney disease or end stage renal disease: Secondary | ICD-10-CM | POA: Diagnosis not present

## 2015-09-05 DIAGNOSIS — Z992 Dependence on renal dialysis: Secondary | ICD-10-CM | POA: Diagnosis not present

## 2015-09-06 DIAGNOSIS — N186 End stage renal disease: Secondary | ICD-10-CM | POA: Diagnosis not present

## 2015-09-06 DIAGNOSIS — E119 Type 2 diabetes mellitus without complications: Secondary | ICD-10-CM | POA: Diagnosis not present

## 2015-09-06 DIAGNOSIS — D509 Iron deficiency anemia, unspecified: Secondary | ICD-10-CM | POA: Diagnosis not present

## 2015-09-06 DIAGNOSIS — Z23 Encounter for immunization: Secondary | ICD-10-CM | POA: Diagnosis not present

## 2015-09-09 DIAGNOSIS — D509 Iron deficiency anemia, unspecified: Secondary | ICD-10-CM | POA: Diagnosis not present

## 2015-09-09 DIAGNOSIS — N186 End stage renal disease: Secondary | ICD-10-CM | POA: Diagnosis not present

## 2015-09-09 DIAGNOSIS — Z23 Encounter for immunization: Secondary | ICD-10-CM | POA: Diagnosis not present

## 2015-09-09 DIAGNOSIS — E119 Type 2 diabetes mellitus without complications: Secondary | ICD-10-CM | POA: Diagnosis not present

## 2015-09-11 DIAGNOSIS — E119 Type 2 diabetes mellitus without complications: Secondary | ICD-10-CM | POA: Diagnosis not present

## 2015-09-11 DIAGNOSIS — D509 Iron deficiency anemia, unspecified: Secondary | ICD-10-CM | POA: Diagnosis not present

## 2015-09-11 DIAGNOSIS — N186 End stage renal disease: Secondary | ICD-10-CM | POA: Diagnosis not present

## 2015-09-11 DIAGNOSIS — Z23 Encounter for immunization: Secondary | ICD-10-CM | POA: Diagnosis not present

## 2015-09-13 DIAGNOSIS — N186 End stage renal disease: Secondary | ICD-10-CM | POA: Diagnosis not present

## 2015-09-13 DIAGNOSIS — E119 Type 2 diabetes mellitus without complications: Secondary | ICD-10-CM | POA: Diagnosis not present

## 2015-09-13 DIAGNOSIS — D509 Iron deficiency anemia, unspecified: Secondary | ICD-10-CM | POA: Diagnosis not present

## 2015-09-13 DIAGNOSIS — Z23 Encounter for immunization: Secondary | ICD-10-CM | POA: Diagnosis not present

## 2015-09-15 DIAGNOSIS — D509 Iron deficiency anemia, unspecified: Secondary | ICD-10-CM | POA: Diagnosis not present

## 2015-09-15 DIAGNOSIS — Z23 Encounter for immunization: Secondary | ICD-10-CM | POA: Diagnosis not present

## 2015-09-15 DIAGNOSIS — E119 Type 2 diabetes mellitus without complications: Secondary | ICD-10-CM | POA: Diagnosis not present

## 2015-09-15 DIAGNOSIS — N186 End stage renal disease: Secondary | ICD-10-CM | POA: Diagnosis not present

## 2015-09-18 ENCOUNTER — Inpatient Hospital Stay: Payer: Medicare Other

## 2015-09-18 ENCOUNTER — Emergency Department: Payer: Medicare Other

## 2015-09-18 ENCOUNTER — Inpatient Hospital Stay
Admission: EM | Admit: 2015-09-18 | Discharge: 2015-09-22 | DRG: 064 | Disposition: A | Discharge status: Skilled Nursing Facility | Payer: Medicare Other | Attending: Internal Medicine | Admitting: Internal Medicine

## 2015-09-18 ENCOUNTER — Encounter: Payer: Self-pay | Admitting: Emergency Medicine

## 2015-09-18 DIAGNOSIS — I639 Cerebral infarction, unspecified: Secondary | ICD-10-CM | POA: Diagnosis not present

## 2015-09-18 DIAGNOSIS — I6523 Occlusion and stenosis of bilateral carotid arteries: Secondary | ICD-10-CM | POA: Diagnosis not present

## 2015-09-18 DIAGNOSIS — I10 Essential (primary) hypertension: Secondary | ICD-10-CM | POA: Diagnosis not present

## 2015-09-18 DIAGNOSIS — I120 Hypertensive chronic kidney disease with stage 5 chronic kidney disease or end stage renal disease: Secondary | ICD-10-CM | POA: Diagnosis not present

## 2015-09-18 DIAGNOSIS — R6889 Other general symptoms and signs: Secondary | ICD-10-CM | POA: Diagnosis not present

## 2015-09-18 DIAGNOSIS — I2510 Atherosclerotic heart disease of native coronary artery without angina pectoris: Secondary | ICD-10-CM | POA: Diagnosis present

## 2015-09-18 DIAGNOSIS — E1122 Type 2 diabetes mellitus with diabetic chronic kidney disease: Secondary | ICD-10-CM | POA: Diagnosis present

## 2015-09-18 DIAGNOSIS — Z23 Encounter for immunization: Secondary | ICD-10-CM | POA: Diagnosis not present

## 2015-09-18 DIAGNOSIS — Z992 Dependence on renal dialysis: Secondary | ICD-10-CM

## 2015-09-18 DIAGNOSIS — G9340 Encephalopathy, unspecified: Secondary | ICD-10-CM | POA: Diagnosis present

## 2015-09-18 DIAGNOSIS — Z66 Do not resuscitate: Secondary | ICD-10-CM | POA: Diagnosis present

## 2015-09-18 DIAGNOSIS — Z951 Presence of aortocoronary bypass graft: Secondary | ICD-10-CM

## 2015-09-18 DIAGNOSIS — E119 Type 2 diabetes mellitus without complications: Secondary | ICD-10-CM | POA: Diagnosis not present

## 2015-09-18 DIAGNOSIS — I5032 Chronic diastolic (congestive) heart failure: Secondary | ICD-10-CM | POA: Diagnosis not present

## 2015-09-18 DIAGNOSIS — N390 Urinary tract infection, site not specified: Secondary | ICD-10-CM | POA: Diagnosis present

## 2015-09-18 DIAGNOSIS — E785 Hyperlipidemia, unspecified: Secondary | ICD-10-CM | POA: Diagnosis present

## 2015-09-18 DIAGNOSIS — K219 Gastro-esophageal reflux disease without esophagitis: Secondary | ICD-10-CM | POA: Diagnosis present

## 2015-09-18 DIAGNOSIS — M8580 Other specified disorders of bone density and structure, unspecified site: Secondary | ICD-10-CM | POA: Diagnosis present

## 2015-09-18 DIAGNOSIS — I272 Other secondary pulmonary hypertension: Secondary | ICD-10-CM | POA: Diagnosis present

## 2015-09-18 DIAGNOSIS — I132 Hypertensive heart and chronic kidney disease with heart failure and with stage 5 chronic kidney disease, or end stage renal disease: Secondary | ICD-10-CM | POA: Diagnosis present

## 2015-09-18 DIAGNOSIS — E039 Hypothyroidism, unspecified: Secondary | ICD-10-CM | POA: Diagnosis present

## 2015-09-18 DIAGNOSIS — Z79899 Other long term (current) drug therapy: Secondary | ICD-10-CM

## 2015-09-18 DIAGNOSIS — N186 End stage renal disease: Secondary | ICD-10-CM | POA: Diagnosis not present

## 2015-09-18 DIAGNOSIS — B9620 Unspecified Escherichia coli [E. coli] as the cause of diseases classified elsewhere: Secondary | ICD-10-CM | POA: Diagnosis not present

## 2015-09-18 DIAGNOSIS — G8194 Hemiplegia, unspecified affecting left nondominant side: Secondary | ICD-10-CM | POA: Diagnosis present

## 2015-09-18 DIAGNOSIS — M6281 Muscle weakness (generalized): Secondary | ICD-10-CM

## 2015-09-18 DIAGNOSIS — R4182 Altered mental status, unspecified: Secondary | ICD-10-CM | POA: Diagnosis present

## 2015-09-18 DIAGNOSIS — G94 Other disorders of brain in diseases classified elsewhere: Secondary | ICD-10-CM | POA: Diagnosis not present

## 2015-09-18 DIAGNOSIS — Z87891 Personal history of nicotine dependence: Secondary | ICD-10-CM

## 2015-09-18 DIAGNOSIS — I739 Peripheral vascular disease, unspecified: Secondary | ICD-10-CM | POA: Diagnosis not present

## 2015-09-18 DIAGNOSIS — N2581 Secondary hyperparathyroidism of renal origin: Secondary | ICD-10-CM | POA: Diagnosis present

## 2015-09-18 DIAGNOSIS — Z952 Presence of prosthetic heart valve: Secondary | ICD-10-CM | POA: Diagnosis not present

## 2015-09-18 DIAGNOSIS — A419 Sepsis, unspecified organism: Secondary | ICD-10-CM | POA: Diagnosis not present

## 2015-09-18 DIAGNOSIS — Z7982 Long term (current) use of aspirin: Secondary | ICD-10-CM

## 2015-09-18 DIAGNOSIS — I63411 Cerebral infarction due to embolism of right middle cerebral artery: Secondary | ICD-10-CM | POA: Diagnosis not present

## 2015-09-18 DIAGNOSIS — Z96653 Presence of artificial knee joint, bilateral: Secondary | ICD-10-CM | POA: Diagnosis present

## 2015-09-18 DIAGNOSIS — I63511 Cerebral infarction due to unspecified occlusion or stenosis of right middle cerebral artery: Principal | ICD-10-CM | POA: Diagnosis present

## 2015-09-18 DIAGNOSIS — E1121 Type 2 diabetes mellitus with diabetic nephropathy: Secondary | ICD-10-CM

## 2015-09-18 DIAGNOSIS — R410 Disorientation, unspecified: Secondary | ICD-10-CM | POA: Diagnosis not present

## 2015-09-18 DIAGNOSIS — D509 Iron deficiency anemia, unspecified: Secondary | ICD-10-CM | POA: Diagnosis not present

## 2015-09-18 DIAGNOSIS — E1151 Type 2 diabetes mellitus with diabetic peripheral angiopathy without gangrene: Secondary | ICD-10-CM | POA: Diagnosis present

## 2015-09-18 DIAGNOSIS — D631 Anemia in chronic kidney disease: Secondary | ICD-10-CM | POA: Diagnosis present

## 2015-09-18 LAB — LACTIC ACID, PLASMA
Lactic Acid, Venous: 4 mmol/L (ref 0.5–1.9)
Lactic Acid, Venous: 2.1 mmol/L (ref 0.5–1.9)

## 2015-09-18 LAB — URINALYSIS COMPLETE WITH MICROSCOPIC (ARMC ONLY)
BILIRUBIN URINE: NEGATIVE
Bacteria, UA: NONE SEEN
GLUCOSE, UA: NEGATIVE mg/dL
HGB URINE DIPSTICK: NEGATIVE
KETONES UR: NEGATIVE mg/dL
NITRITE: NEGATIVE
Protein, ur: 100 mg/dL — AB
Specific Gravity, Urine: 1.009 (ref 1.005–1.030)
Squamous Epithelial / LPF: NONE SEEN
pH: 7 (ref 5.0–8.0)

## 2015-09-18 LAB — CBC
HEMATOCRIT: 40.7 % (ref 35.0–47.0)
HEMOGLOBIN: 13.7 g/dL (ref 12.0–16.0)
MCH: 33.9 pg (ref 26.0–34.0)
MCHC: 33.7 g/dL (ref 32.0–36.0)
MCV: 100.5 fL — AB (ref 80.0–100.0)
Platelets: 132 10*3/uL — ABNORMAL LOW (ref 150–440)
RBC: 4.05 MIL/uL (ref 3.80–5.20)
RDW: 15.2 % — ABNORMAL HIGH (ref 11.5–14.5)
WBC: 9.5 10*3/uL (ref 3.6–11.0)

## 2015-09-18 LAB — MRSA PCR SCREENING: MRSA by PCR: NEGATIVE

## 2015-09-18 LAB — COMPREHENSIVE METABOLIC PANEL
ALK PHOS: 127 U/L — AB (ref 38–126)
ALT: 17 U/L (ref 14–54)
ANION GAP: 14 (ref 5–15)
AST: 40 U/L (ref 15–41)
Albumin: 4.5 g/dL (ref 3.5–5.0)
BUN: 36 mg/dL — ABNORMAL HIGH (ref 6–20)
CALCIUM: 9.4 mg/dL (ref 8.9–10.3)
CHLORIDE: 97 mmol/L — AB (ref 101–111)
CO2: 27 mmol/L (ref 22–32)
CREATININE: 3.66 mg/dL — AB (ref 0.44–1.00)
GFR, EST AFRICAN AMERICAN: 11 mL/min — AB (ref >60)
GFR, EST NON AFRICAN AMERICAN: 10 mL/min — AB (ref >60)
Glucose, Bld: 91 mg/dL (ref 65–99)
Potassium: 5.1 mmol/L (ref 3.5–5.1)
SODIUM: 138 mmol/L (ref 135–145)
Total Bilirubin: 1.1 mg/dL (ref 0.3–1.2)
Total Protein: 8.4 g/dL — ABNORMAL HIGH (ref 6.5–8.1)

## 2015-09-18 LAB — TROPONIN I: TROPONIN I: 0.07 ng/mL — AB (ref <0.03)

## 2015-09-18 MED ORDER — CEFTRIAXONE SODIUM 1 G IJ SOLR
INTRAMUSCULAR | Status: AC
  Administered 2015-09-18: 1 g via INTRAVENOUS
  Filled 2015-09-18: qty 10

## 2015-09-18 MED ORDER — ASPIRIN 300 MG RE SUPP
300.0000 mg | Freq: Every day | RECTAL | Status: DC
  Administered 2015-09-19: 300 mg via RECTAL

## 2015-09-18 MED ORDER — VANCOMYCIN HCL IN DEXTROSE 1-5 GM/200ML-% IV SOLN
1000.0000 mg | Freq: Once | INTRAVENOUS | Status: AC
  Administered 2015-09-18: 1000 mg via INTRAVENOUS
  Filled 2015-09-18: qty 200

## 2015-09-18 MED ORDER — HYDRALAZINE HCL 20 MG/ML IJ SOLN
10.0000 mg | Freq: Four times a day (QID) | INTRAMUSCULAR | Status: DC | PRN
  Administered 2015-09-18 – 2015-09-19 (×2): 10 mg via INTRAVENOUS
  Filled 2015-09-18 (×2): qty 1

## 2015-09-18 MED ORDER — SODIUM CHLORIDE 0.9 % IV BOLUS (SEPSIS)
1578.0000 mL | Freq: Once | INTRAVENOUS | Status: AC
  Administered 2015-09-18: 1578 mL via INTRAVENOUS

## 2015-09-18 MED ORDER — ONDANSETRON HCL 4 MG PO TABS: 4.0000 mg | ORAL_TABLET | Freq: Four times a day (QID) | ORAL | Status: DC | PRN

## 2015-09-18 MED ORDER — PIPERACILLIN-TAZOBACTAM 3.375 G IVPB 30 MIN
3.3750 g | Freq: Once | INTRAVENOUS | Status: AC
  Administered 2015-09-18: 3.375 g via INTRAVENOUS
  Filled 2015-09-18: qty 50

## 2015-09-18 MED ORDER — ACETAMINOPHEN 650 MG RE SUPP
650.0000 mg | Freq: Four times a day (QID) | RECTAL | Status: DC | PRN
  Filled 2015-09-18: qty 1

## 2015-09-18 MED ORDER — ASPIRIN 300 MG RE SUPP
300.0000 mg | Freq: Once | RECTAL | Status: AC
  Administered 2015-09-18: 300 mg via RECTAL
  Filled 2015-09-18: qty 1

## 2015-09-18 MED ORDER — HYDRALAZINE HCL 20 MG/ML IJ SOLN
10.0000 mg | Freq: Once | INTRAMUSCULAR | Status: AC
Start: 2015-09-18 — End: 2015-09-18
  Administered 2015-09-18: 10 mg via INTRAVENOUS

## 2015-09-18 MED ORDER — ONDANSETRON HCL 4 MG/2ML IJ SOLN
4.0000 mg | Freq: Four times a day (QID) | INTRAMUSCULAR | Status: DC | PRN
  Administered 2015-09-19 – 2015-09-21 (×2): 4 mg via INTRAVENOUS
  Filled 2015-09-18 (×3): qty 2

## 2015-09-18 MED ORDER — SODIUM CHLORIDE 0.9 % IV SOLN
INTRAVENOUS | Status: DC
  Administered 2015-09-18: 18:00:00 via INTRAVENOUS

## 2015-09-18 MED ORDER — DEXTROSE 5 % IV SOLN
1.0000 g | INTRAVENOUS | Status: DC
  Administered 2015-09-18 – 2015-09-20 (×3): 1 g via INTRAVENOUS
  Filled 2015-09-18 (×3): qty 10

## 2015-09-18 MED ORDER — ACETAMINOPHEN 325 MG PO TABS: 650.0000 mg | ORAL_TABLET | Freq: Four times a day (QID) | ORAL | Status: DC | PRN

## 2015-09-18 MED ORDER — HEPARIN SODIUM (PORCINE) 5000 UNIT/ML IJ SOLN
5000.0000 [IU] | Freq: Three times a day (TID) | INTRAMUSCULAR | Status: DC
  Administered 2015-09-18 – 2015-09-21 (×7): 5000 [IU] via SUBCUTANEOUS
  Filled 2015-09-18 (×7): qty 1

## 2015-09-18 MED ORDER — HYDRALAZINE HCL 20 MG/ML IJ SOLN
10.0000 mg | Freq: Once | INTRAMUSCULAR | Status: AC
  Administered 2015-09-18: 10 mg via INTRAVENOUS

## 2015-09-18 MED ORDER — HYDRALAZINE HCL 20 MG/ML IJ SOLN
INTRAMUSCULAR | Status: AC
  Administered 2015-09-18: 10 mg via INTRAVENOUS
  Filled 2015-09-18: qty 1

## 2015-09-18 NOTE — ED Notes (Signed)
Radiology at bedside

## 2015-09-18 NOTE — ED Triage Notes (Signed)
Patient comes in via EMS from dialysis where she was receiving treatment.  EMS called due to altered mental status today.  200/100, CBG 117, 98% room air, negative NIH per EMS.  Patient only received 1 hr and 40 min of her 3 hr dialysis treatment.  Patient presents with left arm accessed for dialysis.

## 2015-09-18 NOTE — ED Provider Notes (Signed)
[facility] Emergency Department Provider Note   ____________________________________________   First MD Initiated Contact with Patient 09/18/15 1343     (approximate)  I have reviewed the triage vital signs and the nursing notes.   HISTORY  Chief Complaint Altered Mental Status  Caveat-history of present illness and review of systems is limited due to the patient's altered mental status. Information is obtained from staff at [facility] kidney dialysis Center.  HPI [name] is a 82 y.o. female with history of CHF, end-stage renal disease on dialysis, last partially dialyzed just prior to arrival, aortic stenosis, diabetes, hyperlipidemia, coronary artery disease who presents from dialysis for altered mental status today. According to dialysis staff, when the patient arrived to the dialysis center at 10:30 AM she "wasn't quite herself". She became increasingly altered throughout the dialysis session which was cut short, she only received one hour of dialysis treatment. No other history is available at this time.   Past Medical History:  Diagnosis Date  . Anemia    NOS  . Anxiety   . Aortic stenosis   . Cancer [facility])    Mole on top of head to be removed in July 2013  . CHF (congestive heart failure) ([place])   . Constipation   . Coronary artery disease   . Diabetes mellitus    type II;was on Glipizide but has been off x 64mon  . Dialysis patient ([place])   . Diarrhea   . GERD (gastroesophageal reflux disease)   . History of blood transfusion   . Hyperlipidemia    takes Simvastatin nightly  . Hyperlipidemia   . Hypertension    takes Metoprolol daily  . Hypothyroidism    takes Synthroid daily  . Migraine    hx of  . Oligouria   . Osteoarthritis   . Osteopenia   . Peripheral vascular disease ([place])   . Personal history of colonic polyps   . Pulmonary hypertension ([place])   . Renal insufficiency   . Urinary incontinence   . UTI (urinary tract  infection)     Patient Active Problem List   Diagnosis Date Noted  . Encephalopathy 09/18/2015  . Preventative health care 05/03/2014  . Peripheral vascular disease ([place]) 12/21/2013  . Toe amputation status ([place]) 08/29/2013  . History of recent fall 04/18/2013  . HTN (hypertension), malignant 03/22/2013  . S/P aortic valve replacement with bioprosthetic valve 11/15/2012  . S/P CABG x 3 11/15/2012  . Anemia of chronic kidney failure 12/22/2010  . Thrombocytopenia ([place]) 12/13/2010  . End stage renal disease on dialysis ([place]) 12/13/2010  . S/P aortic valve replacement 08/11/2010  . Arterial insufficiency-lower 07/25/2010  . Constipation, other cause 06/23/2010  . NEURODERMATITIS 04/08/2009  . Carotid arterial disease ([place]) 01/01/2009  . Coronary atherosclerosis 02/11/2007  . AORTIC STENOSIS 10/04/2006  . ANXIETY 05/30/2006  . Pulmonary hypertension ([place]) 05/30/2006  . Hypothyroidism 05/27/2006  . Type 2 diabetes mellitus with renal manifestations, controlled ([place]) 05/27/2006  . Hyperlipidemia 05/27/2006  . Essential hypertension 05/27/2006  . GERD 05/27/2006  . OSTEOARTHRITIS 05/27/2006  . OSTEOPENIA 05/27/2006  . URINARY INCONTINENCE 05/27/2006    Past Surgical History:  Procedure Laterality Date  . ABDOMINAL HYSTERECTOMY    . adenosine myoview  2007   benign, EF 69%  . AMPUTATION  06/18/2011   Procedure: AMPUTATION DIGIT;  Surgeon: Newt Minion, MD;  Location: [place];  Service: Orthopedics;  Laterality: Left;  Left 2nd Toe Amputation at MTP Joint  . Amputation-left  great toe  7/12   Dr Marlou Sa  . AORTIC VALVE REPLACEMENT  2009  . APPENDECTOMY    . AV FISTULA PLACEMENT  12/22/2010   Procedure: ARTERIOVENOUS (AV) FISTULA CREATION;  Surgeon: Hinda Lenis, MD;  Location: [place];  Service: Vascular;  Laterality: Left;  Creation of Left Brachial-Cephalic Fistula  . CARDIAC CATHETERIZATION  2000   cad  . CAROTID ENDARTERECTOMY  2011   Right  . CATARACT EXTRACTION    .  CORONARY ARTERY BYPASS GRAFT     od  . CORONARY ARTERY BYPASS GRAFT  2009  . EYE SURGERY     BIlateral  . INSERTION OF DIALYSIS CATHETER  12/18/2010   Procedure: INSERTION OF DIALYSIS CATHETER;  Surgeon: Mal Misty, MD;  Location: [place];  Service: Vascular;  Laterality: Right;  . REPLACEMENT TOTAL KNEE BILATERAL  05/1998  . TONSILLECTOMY    . Traumatic Amputation of Right DIP joint of Index Finger      Prior to Admission medications   Medication Sig Start Date End Date Taking? Authorizing Provider  aspirin EC 81 MG tablet Take 81 mg by mouth at bedtime.    Historical Provider, MD  calcium carbonate (TUMS - DOSED IN MG ELEMENTAL CALCIUM) 500 MG chewable tablet Chew 1 tablet (200 mg of elemental calcium total) by mouth 3 (three) times daily with meals. 12/06/12   Geradine Girt, DO  folic acid (FOLVITE) 0.5 MG tablet Take 0.5 tablets (0.5 mg total) by mouth daily. 12/06/12   Geradine Girt, DO  levothyroxine (SYNTHROID, LEVOTHROID) 75 MCG tablet TAKE ONE TABLET BY MOUTH ONCE DAILY 07/04/15   [name], MD  metoprolol (LOPRESSOR) 50 MG tablet Take 1 tablet (50 mg total) by mouth 2 (two) times daily. Patient taking differently: Take 50 mg by mouth at bedtime. Pt. Does not take on dialysis days 05/01/14   Minna Merritts, MD  pravastatin (PRAVACHOL) 40 MG tablet Take 1 tablet (40 mg total) by mouth every evening. 05/07/15   Minna Merritts, MD  vitamin C (VITAMIN C) 500 MG tablet Take 1 tablet (500 mg total) by mouth daily. 12/06/12   Geradine Girt, DO    Allergies Nitrofurantoin; Captopril; Enalapril maleate; Ramipril; Sulfa antibiotics; and Verapamil  Family History  Problem Relation Age of Onset  . Family history unknown: Yes    Social History Social History  Substance Use Topics  . Smoking status: Former Smoker    Years: 15.00    Types: Cigarettes    Quit date: 01/06/1943  . Smokeless tobacco: Never Used  . Alcohol use Yes     Comment: Wine occasionally    Review of  Systems  Caveat-history of present illness and review of systems is limited due to the patient's altered mental status. Information is obtained from staff at [facility] kidney dialysis Center. ____________________________________________   PHYSICAL EXAM:  Vitals:   09/18/15 1325 09/18/15 1350 09/18/15 1402 09/18/15 1500  BP: (!) 162/105 (!) 228/89 (!) 206/83 (!) 214/71  Pulse: (!) 103  99 98  Resp: (!) 24  (!) 27   Temp: 97.7 F (36.5 C)     TempSrc: Axillary     SpO2: 100%  100% 98%  Weight: 116 lb (52.6 kg)     Height: 5' (1.524 m)       VITAL SIGNS: ED Triage Vitals [09/18/15 1325]  Enc Vitals Group     BP (!) 162/105     Pulse Rate (!) 103  Resp (!) 24     Temp 97.7 F (36.5 C)     Temp Source Axillary     SpO2 100 %     Weight 116 lb (52.6 kg)     Height 5' (1.524 m)     Head Circumference      Peak Flow      Pain Score      Pain Loc      Pain Edu?      Excl. in [place]?     Constitutional: Awake, looking around the room, appears confused, Does not follow commands. Eyes: Conjunctivae are normal. PERRL. EOMI. Head: Atraumatic. Nose: No congestion/rhinnorhea. Mouth/Throat: Mucous membranes are moist.  Oropharynx non-erythematous. Neck: No stridor. Supple without meningismus. Cardiovascular: Normal rate, regular rhythm. Grossly normal heart sounds.  Good peripheral circulation. Respiratory: Normal respiratory effort.  No retractions. Lungs CTAB. Gastrointestinal: Soft and nontender. No distention. Normal breath sounds. Genitourinary: Deferred Musculoskeletal: No lower extremity tenderness nor edema.  No joint effusions. Neurologic:  Does not speak/verbalize. Moves her extremities spontaneously but does not cooperate with formal neurological testing. Skin:  Skin is warm, dry and intact. No rash noted. Psychiatric: Unable to assess.  ____________________________________________   LABS (all labs ordered are listed, but only abnormal results are  displayed)  Labs Reviewed  COMPREHENSIVE METABOLIC PANEL - Abnormal; Notable for the following:       Result Value   Chloride 97 (*)    BUN 36 (*)    Creatinine, Ser 3.66 (*)    Total Protein 8.4 (*)    Alkaline Phosphatase 127 (*)    GFR calc non Af Amer 10 (*)    GFR calc Af Amer 11 (*)    All other components within normal limits  CBC - Abnormal; Notable for the following:    MCV 100.5 (*)    RDW 15.2 (*)    Platelets 132 (*)    All other components within normal limits  TROPONIN I - Abnormal; Notable for the following:    Troponin I 0.07 (*)    All other components within normal limits  LACTIC ACID, PLASMA - Abnormal; Notable for the following:    Lactic Acid, Venous 4.0 (*)    All other components within normal limits  URINALYSIS COMPLETEWITH MICROSCOPIC ([facility] ONLY) - Abnormal; Notable for the following:    Color, Urine YELLOW (*)    APPearance CLOUDY (*)    Protein, ur 100 (*)    Leukocytes, UA 3+ (*)    All other components within normal limits  CULTURE, BLOOD (ROUTINE X 2)  CULTURE, BLOOD (ROUTINE X 2)  URINE CULTURE  LACTIC ACID, PLASMA  BLOOD GAS, ARTERIAL  CBG MONITORING, ED   ____________________________________________  EKG  ED ECG REPORT I, Joanne Gavel, the attending physician, personally viewed and interpreted this ECG.   Date: 09/18/2015  EKG Time: 13:27  Rate: 102  Rhythm: sinus tachycardia  Axis: right superior  Intervals:none  ST&T Change: No acute ST elevation MI. There is mild ST elevation in aVL but not in any other contiguous/lateral lead. Diffuse ST depression in the inferior leads as well as V4, V5, V6. ____________________________________________  RADIOLOGY  CXR IMPRESSION:  No acute cardiopulmonary abnormality.       CT head IMPRESSION:  Mild diffuse cortical atrophy. Mild chronic ischemic white matter  disease. New right posterior parietal low density is noted  concerning for infarction of indeterminate age. MRI is  recommended  for further evaluation. Critical Value/emergent results were called  by telephone at the time of interpretation on 09/18/2015 at 2:01 pm  to Dr. Loura Pardon , who verbally acknowledged these results.      ____________________________________________   PROCEDURES  Procedure(s) performed: None  Procedures  Critical Care performed: Yes, see critical care note(s)   CRITICAL CARE Performed by: Loura Pardon A   Total critical care time: 35 minutes  Critical care time was exclusive of separately billable procedures and treating other patients.  Critical care was necessary to treat or prevent imminent or life-threatening deterioration.  Critical care was time spent personally by me on the following activities: development of treatment plan with patient and/or surrogate as well as nursing, discussions with consultants, evaluation of patient's response to treatment, examination of patient, obtaining history from patient or surrogate, ordering and performing treatments and interventions, ordering and review of laboratory studies, ordering and review of radiographic studies, pulse oximetry and re-evaluation of patient's condition.  ____________________________________________   INITIAL IMPRESSION / ASSESSMENT AND PLAN / ED COURSE  Pertinent labs & imaging results that were available during my care of the patient were reviewed by me and considered in my medical decision making (see chart for details).  [name] is a 82 y.o. female with history of CHF, end-stage renal disease on dialysis, last partially dialyzed just prior to arrival, aortic stenosis, diabetes, hyperlipidemia, coronary artery disease who presents from dialysis for altered mental status today. Apparently, she was noted to be somewhat altered on relative dialysis at 10:30 AM and is therefore not a candidate for TPA as she would be outside of the window. She is severely hypertensive, we'll give hydralazine and  itchy to monitor. The remainder of her vital signs are stable and she is afebrile. We'll obtain stat CT head, chest x-ray, screening labs as well as urinalysis and anticipate admission.  ----------------------------------------- 3:43 PM on 09/18/2015 ----------------------------------------- Blood pressure improves intermittently with hydralazine. CT scan is concerning for new infarct. Additionally urinalysis is concerning for urinary tract infection, patient received IV vancomycin and Zosyn upfront due to concern for sepsis, her lactic acid is 4, she is receiving the full 30 mL/kg bolus of normal saline. CBC generally unremarkable. Troponin is elevated at 0.07. CMP shows creatinine elevation at 3.66, expected in the setting of ESRD on dialysis. Case discussed with Dr. Jenell Milliner of ICU who recommends hospitalist admission/stepdown status. Case discussed with hospitalist, Dr. Serita Grit for admission at this time.  Clinical Course     ____________________________________________   FINAL CLINICAL IMPRESSION(S) / ED DIAGNOSES  Final diagnoses:  Altered mental status, unspecified altered mental status type  Sepsis, due to unspecified organism [facility])  Cerebral infarction due to unspecified mechanism  Essential hypertension  UTI (lower urinary tract infection)      NEW MEDICATIONS STARTED DURING THIS VISIT:  New Prescriptions   No medications on file     Note:  This document was prepared using Dragon voice recognition software and may include unintentional dictation errors.    Joanne Gavel, MD 09/18/15 [phone]

## 2015-09-18 NOTE — H&P (Signed)
[facility] [place] NAME: [name]    MR#:  [id number]  DATE OF BIRTH:  08-09-1920  DATE OF ADMISSION:  09/18/2015  PRIMARY CARE PHYSICIAN: Viviana Simpler, MD   REQUESTING/REFERRING PHYSICIAN: Dr Edd Fabian  CHIEF COMPLAINT:  Mental status during dialysis today.  HISTORY OF PRESENT ILLNESS:  [name]  is a 82 y.o. female with a known history of Incisional disease on hemodialysis, hypertension, aortic stenosis, diabetes, hyperlipidemia and CAD comes to the emergency room from dialysis for altered mental status. According to the staff at dialysis patient arrived around 10:30 AM she wasn't quite herself. She became increasingly altered and was noted to have blood pressure elevated. No family members present in the emergency room. Dr. Mortimer Fries spoke with son [name] earlier. Patient is DO NOT RESUSCITATE and DO NOT INTUBATE according to Dr. Mortimer Fries Patient's blood pressure initially was systolic in the [id number] she received 2 doses of IV hydralazine 10 mg in the emergency room. She is alert on verbal commands however is not able to answer any questions. She is not able to remember her name or her son's name and not able to hold a conversation. No focal neuro deficit noted at present she is able to move all her extremities well although she is a little slow in responding. Patient's lactic acid was 4 she wasempirically treated with Vanco and Zosyn.  UA shows too numerous to count WBC. Will keep patient on IV Rocephin PAST MEDICAL HISTORY:   Past Medical History:  Diagnosis Date  . Anemia    NOS  . Anxiety   . Aortic stenosis   . Cancer [facility])    Mole on top of head to be removed in July 2013  . CHF (congestive heart failure) ([place])   . Constipation   . Coronary artery disease   . Diabetes mellitus    type II;was on Glipizide but has been off x 55mon  . Dialysis patient ([place])   . Diarrhea   . GERD (gastroesophageal reflux disease)   . History of  blood transfusion   . Hyperlipidemia    takes Simvastatin nightly  . Hyperlipidemia   . Hypertension    takes Metoprolol daily  . Hypothyroidism    takes Synthroid daily  . Migraine    hx of  . Oligouria   . Osteoarthritis   . Osteopenia   . Peripheral vascular disease ([place])   . Personal history of colonic polyps   . Pulmonary hypertension ([place])   . Renal insufficiency   . Urinary incontinence   . UTI (urinary tract infection)     PAST SURGICAL HISTOIRY:   Past Surgical History:  Procedure Laterality Date  . ABDOMINAL HYSTERECTOMY    . adenosine myoview  2007   benign, EF 69%  . AMPUTATION  06/18/2011   Procedure: AMPUTATION DIGIT;  Surgeon: Newt Minion, MD;  Location: [facility];  Service: Orthopedics;  Laterality: Left;  Left 2nd Toe Amputation at MTP Joint  . Amputation-left great toe  7/12   Dr Marlou Sa  . AORTIC VALVE REPLACEMENT  2009  . APPENDECTOMY    . AV FISTULA PLACEMENT  12/22/2010   Procedure: ARTERIOVENOUS (AV) FISTULA CREATION;  Surgeon: Hinda Lenis, MD;  Location: [facility];  Service: Vascular;  Laterality: Left;  Creation of Left Brachial-Cephalic Fistula  . CARDIAC CATHETERIZATION  2000   cad  . CAROTID ENDARTERECTOMY  2011   Right  . CATARACT EXTRACTION    .  CORONARY ARTERY BYPASS GRAFT     od  . CORONARY ARTERY BYPASS GRAFT  2009  . EYE SURGERY     BIlateral  . INSERTION OF DIALYSIS CATHETER  12/18/2010   Procedure: INSERTION OF DIALYSIS CATHETER;  Surgeon: Mal Misty, MD;  Location: [facility];  Service: Vascular;  Laterality: Right;  . REPLACEMENT TOTAL KNEE BILATERAL  05/1998  . TONSILLECTOMY    . Traumatic Amputation of Right DIP joint of Index Finger      SOCIAL HISTORY:   Social History  Substance Use Topics  . Smoking status: Former Smoker    Years: 15.00    Types: Cigarettes    Quit date: 01/06/1943  . Smokeless tobacco: Never Used  . Alcohol use Yes     Comment: Wine occasionally    FAMILY HISTORY:   Family History  Problem  Relation Age of Onset  . Family history unknown: Yes    DRUG ALLERGIES:   Allergies  Allergen Reactions  . Nitrofurantoin Itching  . Captopril Other (See Comments)    REACTION: unspecified  . Enalapril Maleate Cough  . Ramipril Other (See Comments)    REACTION: unspecified  . Sulfa Antibiotics Other (See Comments)    Reaction unknown  . Verapamil Other (See Comments)    REACTION: unspecified    REVIEW OF SYSTEMS:  Review of Systems  Unable to perform ROS: Mental status change     MEDICATIONS AT HOME:   Prior to Admission medications   Medication Sig Start Date End Date Taking? Authorizing Provider  aspirin EC 81 MG tablet Take 81 mg by mouth at bedtime.   Yes Historical Provider, MD  calcium carbonate (TUMS - DOSED IN MG ELEMENTAL CALCIUM) 500 MG chewable tablet Chew 1 tablet (200 mg of elemental calcium total) by mouth 3 (three) times daily with meals. 12/06/12  Yes Geradine Girt, DO  folic acid (FOLVITE) 0.5 MG tablet Take 0.5 tablets (0.5 mg total) by mouth daily. Patient taking differently: Take 0.5 mg by mouth every evening.  12/06/12  Yes Geradine Girt, DO  levothyroxine (SYNTHROID, LEVOTHROID) 75 MCG tablet TAKE ONE TABLET BY MOUTH ONCE DAILY 07/04/15  Yes [name], MD  metoprolol (LOPRESSOR) 50 MG tablet Take 1 tablet (50 mg total) by mouth 2 (two) times daily. Patient taking differently: Take 50 mg by mouth at bedtime. Take twice a day on Tuesdays, Thursdays, Saturdays and Sundays. Take only in the evening on Mondays, Wednesdays and Fridays. 05/01/14  Yes Minna Merritts, MD  pravastatin (PRAVACHOL) 40 MG tablet Take 1 tablet (40 mg total) by mouth every evening. 05/07/15  Yes Minna Merritts, MD  vitamin C (VITAMIN C) 500 MG tablet Take 1 tablet (500 mg total) by mouth daily. 12/06/12  Yes Geradine Girt, DO      VITAL SIGNS:  Blood pressure (!) 214/71, pulse 98, temperature 97.7 F (36.5 C), temperature source Axillary, resp. rate (!) 27, height 5' (1.524  m), weight 52.6 kg (116 lb), SpO2 98 %.  PHYSICAL EXAMINATION:  GENERAL:  82 y.o.-year-old patient lying in the bed with no acute distress.  EYES: Pupils equal, round, reactive to light and accommodation. No scleral icterus. Extraocular muscles intact.  HEENT: Head atraumatic, normocephalic. Oropharynx and nasopharynx clear.  NECK:  Supple, no jugular venous distention. No thyroid enlargement, no tenderness.  LUNGS: Normal breath sounds bilaterally, no wheezing, rales,rhonchi or crepitation. No use of accessory muscles of respiration.  CARDIOVASCULAR: S1, S2 normal. No murmurs, rubs, or  gallops.  ABDOMEN: Soft, nontender, nondistended. Bowel sounds present. No organomegaly or mass.  EXTREMITIES: No pedal edema, cyanosis, or clubbing.  NEUROLOGIC: moves all extremities spont-no neuro deficit noted Slow to respond, says few short sentences and repeats what is saidPSYCHIATRIC: The patient is alert , non verbal SKIN: No obvious rash, lesion, or ulcer.    LABORATORY PANEL:   CBC  Recent Labs Lab 09/18/15 1332  WBC 9.5  HGB 13.7  HCT 40.7  PLT 132*   ------------------------------------------------------------------------------------------------------------------  Chemistries   Recent Labs Lab 09/18/15 1332  NA 138  K 5.1  CL 97*  CO2 27  GLUCOSE 91  BUN 36*  CREATININE 3.66*  CALCIUM 9.4  AST 40  ALT 17  ALKPHOS 127*  BILITOT 1.1   ------------------------------------------------------------------------------------------------------------------  Cardiac Enzymes  Recent Labs Lab 09/18/15 1337  TROPONINI 0.07*   ------------------------------------------------------------------------------------------------------------------  RADIOLOGY:  Ct Head Wo Contrast  Result Date: 09/18/2015 CLINICAL DATA:  Altered mental status. EXAM: CT HEAD WITHOUT CONTRAST TECHNIQUE: Contiguous axial images were obtained from the base of the skull through the vertex without  intravenous contrast. COMPARISON:  CT scan of March 22, 2013. FINDINGS: Bony calvarium is intact. Visualized paranasal sinuses appear normal. Mild diffuse cortical atrophy is noted. Mild chronic ischemic white matter disease is noted. Old lacunar infarction is noted in right basal ganglia. New right posterior parietal low density is noted concerning for infarction of indeterminate age. No hemorrhage or mass lesion is noted. No mass effect or midline shift is noted. Ventricular size is within normal limits. IMPRESSION: Mild diffuse cortical atrophy. Mild chronic ischemic white matter disease. New right posterior parietal low density is noted concerning for infarction of indeterminate age. MRI is recommended for further evaluation. Critical Value/emergent results were called by telephone at the time of interpretation on 09/18/2015 at 2:01 pm to Dr. Loura Pardon , who verbally acknowledged these results. Electronically Signed   By: Marijo Conception, M.D.   On: 09/18/2015 14:01   Dg Chest Portable 1 View  Result Date: 09/18/2015 CLINICAL DATA:  82 year old female with altered mental status. Dialysis patient. Initial encounter. EXAM: PORTABLE CHEST 1 VIEW COMPARISON:  03/22/2013 and earlier. FINDINGS: Portable AP view at 1419 hours. Stable cardiomegaly and mediastinal contours. Sequelae of CABG and cardiac valve replacement. Calcified aortic atherosclerosis. New left distal subclavian/axillary region metallic vascular stent since 2015. Calcified apical lung scarring. No pneumothorax, pulmonary edema, pleural effusion or acute pulmonary opacity. IMPRESSION: No acute cardiopulmonary abnormality. Electronically Signed   By: Genevie Ann M.D.   On: 09/18/2015 14:29    EKG:   s tachy,RAD, ST-T in lateral leads IMPRESSION AND PLAN:  [name]  is a 82 y.o. female with a known history of Incisional disease on hemodialysis, hypertension, aortic stenosis, diabetes, hyperlipidemia and CAD comes to the emergency room from  dialysis for altered mental status. According to the staff at dialysis patient arrived around 10:30 AM she wasn't quite herself. She became increasingly altered and was noted to have blood pressure elevated. No family members present in the emergency room.  1. altered mental status/acute encephalopathy -Etiology likely acute CVA with positive CT head showing new right posterior parietal low density -Admitted to's ICU stepdown -We will allow permissive hypertension. -IV hydralazine 10 every 6 when necessary for systolic greater than [id number] -Per rectal aspirin -Neurology consultation -MRI carotid Doppler speech and physical therapy -Nothing by mouth for now until speech eval swallowing -IV fluids  2. Malignant hypertension -IV when necessary hydralazine -Hold by mouth  meds  3. End stage renal disease disease on hemodialysis Nephrology consultation for in-house hemodialysis  4. Elevated lactic acid in the setting of # 1 and 2 along with UTI -IV Rocephin -Follow urine culture blood culture -Patient received one empiric dose of Vanco and Zosyn in the emergency room  5. DVT prophylaxis subcutaneous heparin  Pt's son [name] has been informed by Dr Mortimer Fries  All the records are reviewed and case discussed with ED provider. Management plans discussed with the patient, family and they are in agreement.  CODE STATUS:DNR  TOTAL criticalTIME TAKING CARE OF THIS PATIENT: 50 minutes.    Tambra Muller M.D on 09/18/2015 at 4:48 PM  Between 7am to 6pm - Pager - [phone]  After 6pm go to [email or web address] - password [email or web address] [facility]  Office  [phone]  CC: Primary care physician; Viviana Simpler, MD

## 2015-09-18 NOTE — Consult Note (Signed)
PULMONARY / CRITICAL CARE MEDICINE   Name: [name] MRN: [id number] DOB: 11-30-1920    ADMISSION DATE:  09/18/2015  REFERRING MD :  Dr. Edd Fabian  CHIEF COMPLAINT:  Acute mental status changes  INITIAL PRESENTATION: mental status changes from HD  HISTORY OF PRESENT ILLNESS:   82 yo white female seen today for acute mental status changes and HTN crisis that occurred dusing her HD Patient has ESRD on HD  EMS called due to altered mental status today.  200/100, CBG 117, 98% room air, negative NIH per EMS.   Patient only received 1 hr and 40 min of her 3 hr dialysis treatment.    Patient found to have LA of 4 and abnormal CT head with New right posterior parietal low density is noted concerning for infarction of indeterminate age  Patient with elevated BP SBP 214 Patient confused, alert, not orientated, no apparent distress Can not communicate   PAST MEDICAL HISTORY :   has a past medical history of Anemia; Anxiety; Aortic stenosis; Cancer [facility]); CHF (congestive heart failure) ([place]); Constipation; Coronary artery disease; Diabetes mellitus; Dialysis patient [facility]); Diarrhea; GERD (gastroesophageal reflux disease); History of blood transfusion; Hyperlipidemia; Hyperlipidemia; Hypertension; Hypothyroidism; Migraine; Oligouria; Osteoarthritis; Osteopenia; Peripheral vascular disease ([place]); Personal history of colonic polyps; Pulmonary hypertension ([place]); Renal insufficiency; Urinary incontinence; and UTI (urinary tract infection).  has a past surgical history that includes Abdominal hysterectomy; Tonsillectomy; Cardiac catheterization (2000); Replacement total knee bilateral (05/1998); Cataract extraction; Coronary artery bypass graft; adenosine myoview (2007); Carotid endarterectomy (2011); Coronary artery bypass graft (2009); Aortic valve replacement (2009); Amputation-left great toe (7/12); Traumatic Amputation of Right DIP joint of Index Finger; Insertion of dialysis catheter (12/18/2010);  AV fistula placement (12/22/2010); Appendectomy; Eye surgery; and Amputation (06/18/2011). Prior to Admission medications   Medication Sig Start Date End Date Taking? Authorizing Provider  aspirin EC 81 MG tablet Take 81 mg by mouth at bedtime.    Historical Provider, MD  calcium carbonate (TUMS - DOSED IN MG ELEMENTAL CALCIUM) 500 MG chewable tablet Chew 1 tablet (200 mg of elemental calcium total) by mouth 3 (three) times daily with meals. 12/06/12   Geradine Girt, DO  folic acid (FOLVITE) 0.5 MG tablet Take 0.5 tablets (0.5 mg total) by mouth daily. 12/06/12   Geradine Girt, DO  levothyroxine (SYNTHROID, LEVOTHROID) 75 MCG tablet TAKE ONE TABLET BY MOUTH ONCE DAILY 07/04/15   [name], MD  metoprolol (LOPRESSOR) 50 MG tablet Take 1 tablet (50 mg total) by mouth 2 (two) times daily. Patient taking differently: Take 50 mg by mouth at bedtime. Pt. Does not take on dialysis days 05/01/14   Minna Merritts, MD  pravastatin (PRAVACHOL) 40 MG tablet Take 1 tablet (40 mg total) by mouth every evening. 05/07/15   Minna Merritts, MD  vitamin C (VITAMIN C) 500 MG tablet Take 1 tablet (500 mg total) by mouth daily. 12/06/12   Geradine Girt, DO   Allergies  Allergen Reactions  . Nitrofurantoin Itching  . Captopril Other (See Comments)    REACTION: unspecified  . Enalapril Maleate Cough  . Ramipril Other (See Comments)    REACTION: unspecified  . Sulfa Antibiotics Other (See Comments)    Reaction unknown  . Verapamil Other (See Comments)    REACTION: unspecified    FAMILY HISTORY:  indicated that her mother is deceased. She indicated that her father is deceased.   SOCIAL HISTORY:  reports that she quit smoking about 72 years ago. Her smoking  use included Cigarettes. She quit after 15.00 years of use. She has never used smokeless tobacco. She reports that she drinks alcohol. She reports that she does not use drugs.  REVIEW OF SYSTEMS: unobtainable due to mental condition    VITAL  SIGNS: Temp:  [97.7 F (36.5 C)] 97.7 F (36.5 C) (09/13 1325) Pulse Rate:  [98-103] 98 (09/13 1500) Resp:  [24-27] 27 (09/13 1402) BP: (162-228)/(71-105) 214/71 (09/13 1500) SpO2:  [98 %-100 %] 98 % (09/13 1500) Weight:  [116 lb (52.6 kg)] 116 lb (52.6 kg) (09/13 1325) HEMODYNAMICS:      INTAKE / OUTPUT: No intake or output data in the 24 hours ending 09/18/15 1527  PHYSICAL EXAMINATION: PHYSICAL EXAMINATION: Physical Examination:   GENERAL:no apparent distress HEAD: Normocephalic, atraumatic.  EYES: Pupils equal, round, reactive to light.  No scleral icterus.  MOUTH: Moist mucosal membrane. NECK: Supple. No thyromegaly. No nodules. No JVD. c collar in place PULMONARY: CTA b/L no wheezes CARDIOVASCULAR: S1 and S2. Regular rate and rhythm. No murmurs, rubs, or gallops.  GASTROINTESTINAL: Soft, nontender, +distended. No masses. Positive bowel sounds. No hepatosplenomegaly.  MUSCULOSKELETAL: No swelling, clubbing, or edema.  NEUROLOGIC: no focal deficits,  SKIN:intact,warm,dry    LABS:  CBC  Recent Labs Lab 09/18/15 1332  WBC 9.5  HGB 13.7  HCT 40.7  PLT 132*   Coag's No results for input(s): APTT, INR in the last 168 hours. BMET  Recent Labs Lab 09/18/15 1332  NA 138  K 5.1  CL 97*  CO2 27  BUN 36*  CREATININE 3.66*  GLUCOSE 91   Electrolytes  Recent Labs Lab 09/18/15 1332  CALCIUM 9.4   Sepsis Markers  Recent Labs Lab 09/18/15 1402  LATICACIDVEN 4.0*   ABG No results for input(s): PHART, PCO2ART, PO2ART in the last 168 hours. Liver Enzymes  Recent Labs Lab 09/18/15 1332  AST 40  ALT 17  ALKPHOS 127*  BILITOT 1.1  ALBUMIN 4.5   Cardiac Enzymes  Recent Labs Lab 09/18/15 1337  TROPONINI 0.07*   Glucose No results for input(s): GLUCAP in the last 168 hours.  Imaging Ct Head Wo Contrast  Result Date: 09/18/2015 CLINICAL DATA:  Altered mental status. EXAM: CT HEAD WITHOUT CONTRAST TECHNIQUE: Contiguous axial images were  obtained from the base of the skull through the vertex without intravenous contrast. COMPARISON:  CT scan of March 22, 2013. FINDINGS: Bony calvarium is intact. Visualized paranasal sinuses appear normal. Mild diffuse cortical atrophy is noted. Mild chronic ischemic white matter disease is noted. Old lacunar infarction is noted in right basal ganglia. New right posterior parietal low density is noted concerning for infarction of indeterminate age. No hemorrhage or mass lesion is noted. No mass effect or midline shift is noted. Ventricular size is within normal limits. IMPRESSION: Mild diffuse cortical atrophy. Mild chronic ischemic white matter disease. New right posterior parietal low density is noted concerning for infarction of indeterminate age. MRI is recommended for further evaluation. Critical Value/emergent results were called by telephone at the time of interpretation on 09/18/2015 at 2:01 pm to Dr. Loura Pardon , who verbally acknowledged these results. Electronically Signed   By: Marijo Conception, M.D.   On: 09/18/2015 14:01   Dg Chest Portable 1 View  Result Date: 09/18/2015 CLINICAL DATA:  82 year old female with altered mental status. Dialysis patient. Initial encounter. EXAM: PORTABLE CHEST 1 VIEW COMPARISON:  03/22/2013 and earlier. FINDINGS: Portable AP view at 1419 hours. Stable cardiomegaly and mediastinal contours. Sequelae of CABG and cardiac  valve replacement. Calcified aortic atherosclerosis. New left distal subclavian/axillary region metallic vascular stent since 2015. Calcified apical lung scarring. No pneumothorax, pulmonary edema, pleural effusion or acute pulmonary opacity. IMPRESSION: No acute cardiopulmonary abnormality. Electronically Signed   By: Genevie Ann M.D.   On: 09/18/2015 14:29     ASSESSMENT / PLAN:   82 yo white female with HTN crisis with acute encephalopathy from acute CVA, I have called and confirmed with Charlie the son, patient is DNR/DNI   PULMONARY Oxygen as  needed  CARDIOVASCULAR BP control with meds goal to control to SBP 160  RENAL Follow up HD as needed per nephrology  GASTROINTESTINAL NPO  HEMATOLOGIC Follow CBC    NEUROLOGIC Acute confusion, MS changes, awake, does not follow commands  FAMILY  -SON  Updated on phone Confirmed DNR/DNI status   After further assessment, patient is SD status candidate We will follow along as consulting team.  I have discussed with Hospitalist Team to admit   I have personally obtained a history, examined the patient, evaluated Pertinent laboratory and RadioGraphic/imaging results, and  formulated the assessment and plan   The Patient requires high complexity decision making for assessment and support, frequent evaluation and titration of therapies, application of advanced monitoring technologies and extensive interpretation of multiple databases. Critical Care Time devoted to patient care services described in this note is 45 minutes.   Overall, patient is critically ill, prognosis is guarded.    Corrin Parker, M.D.  [facility]  Medical Director [place] Director [facility] Cardio-Pulmonary Department

## 2015-09-18 NOTE — ED Notes (Signed)
Admitting MD at bedside.

## 2015-09-19 ENCOUNTER — Inpatient Hospital Stay: Payer: Medicare Other

## 2015-09-19 DIAGNOSIS — I63411 Cerebral infarction due to embolism of right middle cerebral artery: Secondary | ICD-10-CM

## 2015-09-19 DIAGNOSIS — R410 Disorientation, unspecified: Secondary | ICD-10-CM

## 2015-09-19 LAB — GLUCOSE, CAPILLARY
GLUCOSE-CAPILLARY: 114 mg/dL — AB (ref 65–99)
GLUCOSE-CAPILLARY: 95 mg/dL (ref 65–99)
GLUCOSE-CAPILLARY: 152 mg/dL — AB (ref 65–99)

## 2015-09-19 MED ORDER — POLYVINYL ALCOHOL 1.4 % OP SOLN
1.0000 [drp] | OPHTHALMIC | Status: DC | PRN
Start: 2015-09-19 — End: 2015-09-22
  Administered 2015-09-20: 1 [drp] via OPHTHALMIC
  Filled 2015-09-19: qty 15

## 2015-09-19 MED ORDER — METOPROLOL TARTRATE 50 MG PO TABS
50.0000 mg | ORAL_TABLET | ORAL | Status: DC
  Administered 2015-09-19 – 2015-09-22 (×4): 50 mg via ORAL
  Filled 2015-09-19 (×5): qty 1

## 2015-09-19 MED ORDER — CLOPIDOGREL BISULFATE 75 MG PO TABS
75.0000 mg | ORAL_TABLET | Freq: Every day | ORAL | Status: DC
  Administered 2015-09-19 – 2015-09-22 (×4): 75 mg via ORAL
  Filled 2015-09-19 (×4): qty 1

## 2015-09-19 MED ORDER — PRAVASTATIN SODIUM 40 MG PO TABS
40.0000 mg | ORAL_TABLET | Freq: Every evening | ORAL | Status: DC
  Administered 2015-09-19 – 2015-09-21 (×3): 40 mg via ORAL
  Filled 2015-09-19 (×3): qty 1

## 2015-09-19 MED ORDER — INSULIN ASPART 100 UNIT/ML ~~LOC~~ SOLN
0.0000 [IU] | Freq: Three times a day (TID) | SUBCUTANEOUS | Status: DC
  Administered 2015-09-22: 1 [IU] via SUBCUTANEOUS
  Filled 2015-09-19: qty 3
  Filled 2015-09-19: qty 1

## 2015-09-19 MED ORDER — INSULIN ASPART 100 UNIT/ML ~~LOC~~ SOLN: 0.0000 [IU] | SUBCUTANEOUS | Status: DC

## 2015-09-19 MED ORDER — METOPROLOL TARTRATE 50 MG PO TABS
50.0000 mg | ORAL_TABLET | ORAL | Status: DC
  Administered 2015-09-20: 22:00:00 50 mg via ORAL

## 2015-09-19 MED ORDER — HYDRALAZINE HCL 20 MG/ML IJ SOLN
10.0000 mg | Freq: Four times a day (QID) | INTRAMUSCULAR | Status: DC | PRN
  Administered 2015-09-19 – 2015-09-20 (×3): 20 mg via INTRAVENOUS
  Administered 2015-09-20 – 2015-09-21 (×2): 10 mg via INTRAVENOUS
  Administered 2015-09-22: 09:00:00 20 mg via INTRAVENOUS
  Administered 2015-09-22: 05:00:00 10 mg via INTRAVENOUS
  Filled 2015-09-19 (×7): qty 1

## 2015-09-19 MED ORDER — LEVOTHYROXINE SODIUM 150 MCG PO TABS
75.0000 ug | ORAL_TABLET | Freq: Every day | ORAL | Status: DC
  Administered 2015-09-20: 09:00:00 75 ug via ORAL
  Administered 2015-09-21: 09:00:00 150 ug via ORAL
  Administered 2015-09-22: 75 ug via ORAL
  Filled 2015-09-19 (×3): qty 1

## 2015-09-19 MED ORDER — INSULIN ASPART 100 UNIT/ML ~~LOC~~ SOLN: 0.0000 [IU] | Freq: Every day | SUBCUTANEOUS | Status: DC

## 2015-09-19 MED ORDER — LABETALOL HCL 5 MG/ML IV SOLN
20.0000 mg | Freq: Once | INTRAVENOUS | Status: AC
  Administered 2015-09-19: 20 mg via INTRAVENOUS
  Filled 2015-09-19: qty 4

## 2015-09-19 MED ORDER — METOPROLOL TARTRATE 50 MG PO TABS: 50.0000 mg | ORAL_TABLET | Freq: Two times a day (BID) | ORAL | Status: DC

## 2015-09-19 NOTE — Progress Notes (Signed)
[facility] NAME: [name]    MR#:  [id number]  DATE OF BIRTH:  1920-02-18  SUBJECTIVE:   Patient here due to altered mental status and noted to have an acute CVA. She was also noted to have a urinary tract infection. Still a bit confused today. No other complaints.    REVIEW OF SYSTEMS:    Review of Systems  Constitutional: Negative for chills and fever.  HENT: Negative for congestion and tinnitus.   Eyes: Negative for blurred vision and double vision.  Respiratory: Negative for cough, shortness of breath and wheezing.   Cardiovascular: Negative for chest pain, orthopnea and PND.  Gastrointestinal: Negative for abdominal pain, diarrhea, nausea and vomiting.  Genitourinary: Negative for dysuria and hematuria.  Neurological: Negative for dizziness, sensory change and focal weakness.  All other systems reviewed and are negative.   Nutrition: dysphagia III Tolerating Diet: Yes Tolerating PT: Await Eval   DRUG ALLERGIES:   Allergies  Allergen Reactions  . Nitrofurantoin Itching  . Captopril Other (See Comments)    REACTION: unspecified  . Enalapril Maleate Cough  . Ramipril Other (See Comments)    REACTION: unspecified  . Sulfa Antibiotics Other (See Comments)    Reaction unknown  . Verapamil Other (See Comments)    REACTION: unspecified    VITALS:  Blood pressure (!) 178/68, pulse 93, temperature 98.3 F (36.8 C), temperature source Oral, resp. rate 18, height 5' (1.524 m), weight 52.6 kg (116 lb), SpO2 98 %.  PHYSICAL EXAMINATION:   Physical Exam  GENERAL:  81 y.o.-year-old patient lying in the bed confused a bit but in NAD.   EYES: Pupils equal, round, reactive to light and accommodation. No scleral icterus. Extraocular muscles intact.  HEENT: Head atraumatic, normocephalic. Oropharynx and nasopharynx clear.  NECK:  Supple, no jugular venous distention. No thyroid enlargement, no tenderness.  LUNGS: Normal  breath sounds bilaterally, no wheezing, rales, rhonchi. No use of accessory muscles of respiration.  CARDIOVASCULAR: S1, S2 normal. No murmurs, rubs, or gallops.  ABDOMEN: Soft, nontender, nondistended. Bowel sounds present. No organomegaly or mass.  EXTREMITIES: No cyanosis, clubbing or edema b/l.    NEUROLOGIC: Cranial nerves II through XII are intact. No focal Motor or sensory deficits b/l. Globally weak.    PSYCHIATRIC: The patient is alert and oriented x 1.  SKIN: No obvious rash, lesion, or ulcer.   Left upper ext. AV fistula with good bruit, thrill.    LABORATORY PANEL:   CBC  Recent Labs Lab 09/18/15 1332  WBC 9.5  HGB 13.7  HCT 40.7  PLT 132*   ------------------------------------------------------------------------------------------------------------------  Chemistries   Recent Labs Lab 09/18/15 1332  NA 138  K 5.1  CL 97*  CO2 27  GLUCOSE 91  BUN 36*  CREATININE 3.66*  CALCIUM 9.4  AST 40  ALT 17  ALKPHOS 127*  BILITOT 1.1   ------------------------------------------------------------------------------------------------------------------  Cardiac Enzymes  Recent Labs Lab 09/18/15 1337  TROPONINI 0.07*   ------------------------------------------------------------------------------------------------------------------  RADIOLOGY:  Ct Head Wo Contrast  Result Date: 09/18/2015 CLINICAL DATA:  Altered mental status. EXAM: CT HEAD WITHOUT CONTRAST TECHNIQUE: Contiguous axial images were obtained from the base of the skull through the vertex without intravenous contrast. COMPARISON:  CT scan of March 22, 2013. FINDINGS: Bony calvarium is intact. Visualized paranasal sinuses appear normal. Mild diffuse cortical atrophy is noted. Mild chronic ischemic white matter disease is noted. Old lacunar infarction is noted in right basal ganglia. New right posterior  parietal low density is noted concerning for infarction of indeterminate age. No hemorrhage or mass  lesion is noted. No mass effect or midline shift is noted. Ventricular size is within normal limits. IMPRESSION: Mild diffuse cortical atrophy. Mild chronic ischemic white matter disease. New right posterior parietal low density is noted concerning for infarction of indeterminate age. MRI is recommended for further evaluation. Critical Value/emergent results were called by telephone at the time of interpretation on 09/18/2015 at 2:01 pm to Dr. Loura Pardon , who verbally acknowledged these results. Electronically Signed   By: Marijo Conception, M.D.   On: 09/18/2015 14:01   Mr Brain Wo Contrast  Result Date: 09/19/2015 CLINICAL DATA:  Dialysis patient with altered mental status. EXAM: MRI HEAD WITHOUT CONTRAST TECHNIQUE: Multiplanar, multiecho pulse sequences of the brain and surrounding structures were obtained without intravenous contrast. COMPARISON:  CT head 09/18/2015. FINDINGS: Brain: RIGHT posterior parieto-temporo-occipital acute infarction, posterior division RIGHT MCA territory, involving cortex and subcortical white matter, correlating with the area of cytotoxic edema noted on CT. No other areas of restricted diffusion. LEFT frontal low level increased signal on diffusion image 33 series 6 not clearly restricted on ADC map, favored to represent T2 shine through given the hyperintensity on T2 and FLAIR imaging, although a subacute subcentimeter infarct not completely excluded. No visible acute hemorrhage, mass lesion, or extra-axial fluid. Global atrophy with hydrocephalus ex vacuo. Extensive chronic microvascular ischemic change throughout the white matter. Vascular: Flow voids are maintained in the carotids, basilar, and both vertebral arteries. Chronic hemorrhage is associated with a lacunar infarct in the RIGHT thalamus. Chronic subcortical hemorrhage is seen in the LEFT posterior temporal region. Other tiny foci of chronic hemorrhage throughout the hemispheres and posterior fossa, likely sequelae of  hypertensive cerebrovascular disease. Skull and upper cervical spine: Unremarkable visualized calvarium, skullbase, and cervical vertebrae. Pituitary, pineal, cerebellar tonsils unremarkable. No upper cervical cord lesions. Sinuses/Orbits: No orbital masses or proptosis. Globes appear symmetric. Sinuses appear well aerated, without evidence for air-fluid level. Other: No nasopharyngeal pathology or mastoid fluid. Scalp and other visualized extracranial soft tissues grossly unremarkable. IMPRESSION: Acute RIGHT posterior MCA territory infarct affecting the temporo-parieto-occipital cortex and white matter. No acute hemorrhage is associated. Global atrophy. Multiple foci of chronic hemorrhage along with extensive white matter disease, favored to represent sequelae of hypertensive cerebrovascular disease. No proximal vascular occlusion is evident. Electronically Signed   By: Staci Righter M.D.   On: 09/19/2015 11:33   US Carotid Bilateral  Result Date: 09/19/2015 CLINICAL DATA:  Altered mental status. EXAM: BILATERAL CAROTID DUPLEX ULTRASOUND TECHNIQUE: Pearline Cables scale imaging, color Doppler and duplex ultrasound were performed of bilateral carotid and vertebral arteries in the neck. COMPARISON:  None. FINDINGS: Criteria: Quantification of carotid stenosis is based on velocity parameters that correlate the residual internal carotid diameter with NASCET-based stenosis levels, using the diameter of the distal internal carotid lumen as the denominator for stenosis measurement. The following velocity measurements were obtained: RIGHT ICA:  106/19 cm/sec CCA:  [id number] cm/sec SYSTOLIC ICA/CCA RATIO:  1.1 DIASTOLIC ICA/CCA RATIO:  1.4 ECA:  100 cm/sec LEFT ICA:  127/27 cm/sec CCA:  [id number] cm/sec SYSTOLIC ICA/CCA RATIO:  0.8 DIASTOLIC ICA/CCA RATIO:  2.6 ECA:  174 cm/sec RIGHT CAROTID ARTERY: Status post right endarterectomy. Mild plaque formation is noted consistent with less than 50% diameter stenosis based on ultrasound and  Doppler criteria. RIGHT VERTEBRAL ARTERY:  Antegrade flow is noted. LEFT CAROTID ARTERY: Large irregular and partially calcified plaque is noted in the midportion  of the left common carotid artery. Calcified moderate plaque is noted in the proximal left internal carotid artery. Maximum measured velocities are consistent with 50-69% stenosis based on ultrasound and Doppler criteria. However, due to the extensive calcification, portions of the lumen are not well visualized and more significant stenosis cannot be excluded. LEFT VERTEBRAL ARTERY:  Antegrade flow is noted. IMPRESSION: Status post right carotid endarterectomy. Mild plaque formation is noted in the proximal right internal carotid artery consistent with less than 50% diameter stenosis based on ultrasound and Doppler criteria. Large irregular and partially calcified plaque is noted in the midportion of the left common carotid artery. Heavily calcified plaque is noted in the proximal left internal carotid artery consistent with 50-69% stenosis based on ultrasound and Doppler criteria. However, due to the extensive calcifications, portions of the lumen are not visualized and more significant stenosis cannot be excluded. CT angiography is recommended for further evaluation. Electronically Signed   By: Marijo Conception, M.D.   On: 09/19/2015 10:47   Dg Chest Portable 1 View  Result Date: 09/18/2015 CLINICAL DATA:  82 year old female with altered mental status. Dialysis patient. Initial encounter. EXAM: PORTABLE CHEST 1 VIEW COMPARISON:  03/22/2013 and earlier. FINDINGS: Portable AP view at 1419 hours. Stable cardiomegaly and mediastinal contours. Sequelae of CABG and cardiac valve replacement. Calcified aortic atherosclerosis. New left distal subclavian/axillary region metallic vascular stent since 2015. Calcified apical lung scarring. No pneumothorax, pulmonary edema, pleural effusion or acute pulmonary opacity. IMPRESSION: No acute cardiopulmonary  abnormality. Electronically Signed   By: Genevie Ann M.D.   On: 09/18/2015 14:29     ASSESSMENT AND PLAN:   82 year old female with past medical history of end-stage renal disease on hemodialysis, pulmonary hypertension, peripheral vascular disease, diabetes, CHF, hypothyroidism, GERD who presented to the hospital due to altered mental status.  1. Altered Mental status-due to acute CVA/UTI. -Continue IV ceftriaxone for UTI. Mental status is improving.  2. Acute CVA-patient was noted to have an acute/subacute right posterior circulation CVA. -Appreciate neurology input. Change from aspirin to Plavix. -Await PT OT evaluation. Seen by speech and started on a dysphagia 3 diet. - cont. Pravachol.   3. Urinary tract infection-continue IV ceftriaxone, follow urine cultures.  4. Diabetes type 2 without compensation-continue sliding scale insulin.  5. Accelerated hypertension-allow some permissive hypertension given the patient's acute CVA. -Continue when necessary IV hydralazine, cont. Metoprolol.    All the records are reviewed and case discussed with Care Management/Social Worker. Management plans discussed with the patient, family and they are in agreement.  CODE STATUS: Full code  DVT Prophylaxis: Heparin SQ  TOTAL TIME TAKING CARE OF THIS PATIENT: 30 minutes.   POSSIBLE D/C IN 2-3 DAYS, DEPENDING ON CLINICAL CONDITION.   Henreitta Leber M.D on 09/19/2015 at 1:27 PM  Between 7am to 6pm - Pager - [phone]  After 6pm go to [email or web address] - Proofreader  [facility]  Office  [phone]  CC: Primary care physician; Leotis Pain, MD

## 2015-09-19 NOTE — Progress Notes (Signed)
Pt has become progressively more alert throughout the night, answering questions intermittently and following commands.  Patient remains confused by easily redirected.  Pt has purposeful movement in all extremities, negative for limb ataxia, and has facial symmetry.    She does have a hard time responding to questions at times, the son says this could be due to the patient being VERY hard of hearing and does not have hearing aides at this time. VSS, PRN hydralazine and labatalol given for SBP>180.   Report given to Roselyn Reef, Therapist, sports.

## 2015-09-19 NOTE — Consult Note (Addendum)
Reason for Consult:stroke Referring Physician: Dr. Verdell Carmine  CC: confusion   HPI: [name] is an 82 y.o. female  with a known history of Incisional disease on hemodialysis, hypertension, aortic stenosis, diabetes, hyperlipidemia and CAD comes to the emergency room from dialysis for altered mental status. According to the staff at dialysis patient arrived around 10:30 AM yesterday she wasn't quite herself. She became increasingly altered and was noted to have blood pressure elevated.  As the day progressed her mentation has improved. She was found to have R Parietal infarct that is likely subacute.    Past Medical History:  Diagnosis Date  . Anemia    NOS  . Anxiety   . Aortic stenosis   . Cancer [facility])    Mole on top of head to be removed in July 2013  . CHF (congestive heart failure) ([place])   . Constipation   . Coronary artery disease   . Diabetes mellitus    type II;was on Glipizide but has been off x 36mon  . Dialysis patient ([place])   . Diarrhea   . GERD (gastroesophageal reflux disease)   . History of blood transfusion   . Hyperlipidemia    takes Simvastatin nightly  . Hyperlipidemia   . Hypertension    takes Metoprolol daily  . Hypothyroidism    takes Synthroid daily  . Migraine    hx of  . Oligouria   . Osteoarthritis   . Osteopenia   . Peripheral vascular disease ([place])   . Personal history of colonic polyps   . Pulmonary hypertension ([place])   . Renal insufficiency   . Urinary incontinence   . UTI (urinary tract infection)     Past Surgical History:  Procedure Laterality Date  . ABDOMINAL HYSTERECTOMY    . adenosine myoview  2007   benign, EF 69%  . AMPUTATION  06/18/2011   Procedure: AMPUTATION DIGIT;  Surgeon: Newt Minion, MD;  Location: [place];  Service: Orthopedics;  Laterality: Left;  Left 2nd Toe Amputation at MTP Joint  . Amputation-left great toe  7/12   Dr Marlou Sa  . AORTIC VALVE REPLACEMENT  2009  . APPENDECTOMY    . AV FISTULA PLACEMENT  12/22/2010    Procedure: ARTERIOVENOUS (AV) FISTULA CREATION;  Surgeon: Hinda Lenis, MD;  Location: [place];  Service: Vascular;  Laterality: Left;  Creation of Left Brachial-Cephalic Fistula  . CARDIAC CATHETERIZATION  2000   cad  . CAROTID ENDARTERECTOMY  2011   Right  . CATARACT EXTRACTION    . CORONARY ARTERY BYPASS GRAFT     od  . CORONARY ARTERY BYPASS GRAFT  2009  . EYE SURGERY     BIlateral  . INSERTION OF DIALYSIS CATHETER  12/18/2010   Procedure: INSERTION OF DIALYSIS CATHETER;  Surgeon: Mal Misty, MD;  Location: [place];  Service: Vascular;  Laterality: Right;  . REPLACEMENT TOTAL KNEE BILATERAL  05/1998  . TONSILLECTOMY    . Traumatic Amputation of Right DIP joint of Index Finger      Family History  Problem Relation Age of Onset  . Family history unknown: Yes    Social History:  reports that she quit smoking about 72 years ago. Her smoking use included Cigarettes. She quit after 15.00 years of use. She has never used smokeless tobacco. She reports that she drinks alcohol. She reports that she does not use drugs.  Allergies  Allergen Reactions  . Nitrofurantoin Itching  . Captopril Other (See Comments)  REACTION: unspecified  . Enalapril Maleate Cough  . Ramipril Other (See Comments)    REACTION: unspecified  . Sulfa Antibiotics Other (See Comments)    Reaction unknown  . Verapamil Other (See Comments)    REACTION: unspecified    Medications: I have reviewed the patient's current medications.  ROS: History obtained from the patient  General ROS: negative for - chills, fatigue, fever, night sweats, weight gain or weight loss Psychological ROS: negative for - behavioral disorder, hallucinations, memory difficulties, mood swings or suicidal ideation Ophthalmic ROS: negative for - blurry vision, double vision, eye pain or loss of vision ENT ROS: negative for - epistaxis, nasal discharge, oral lesions, sore throat, tinnitus or vertigo Allergy and Immunology ROS:  negative for - hives or itchy/watery eyes Hematological and Lymphatic ROS: negative for - bleeding problems, bruising or swollen lymph nodes Endocrine ROS: negative for - galactorrhea, hair pattern changes, polydipsia/polyuria or temperature intolerance Respiratory ROS: negative for - cough, hemoptysis, shortness of breath or wheezing Cardiovascular ROS: negative for - chest pain, dyspnea on exertion, edema or irregular heartbeat Gastrointestinal ROS: negative for - abdominal pain, diarrhea, hematemesis, nausea/vomiting or stool incontinence Genito-Urinary ROS: negative for - dysuria, hematuria, incontinence or urinary frequency/urgency Musculoskeletal ROS: negative for - joint swelling or muscular weakness Neurological ROS: as noted in HPI Dermatological ROS: negative for rash and skin lesion changes  Physical Examination: Blood pressure (!) 178/68, pulse 93, temperature 98.3 F (36.8 C), temperature source Oral, resp. rate 18, height 5' (1.524 m), weight 52.6 kg (116 lb), SpO2 98 %.   Neurological Examination Mental Status: Alert, oriented, thought content appropriate Cranial Nerves: II: Discs flat bilaterally; Visual fields grossly normal, pupils equal, round, reactive to light and accommodation III,IV, VI: ptosis not present, extra-ocular motions intact bilaterally V,VII: smile symmetric, facial light touch sensation normal bilaterally VIII: hearing normal bilaterally IX,X: gag reflex present XI: bilateral shoulder shrug XII: midline tongue extension Motor: Right : Upper extremity   4/5    Left:     Upper extremity   4/5  Lower extremity   4/5     Lower extremity   4/5 Tone and bulk:normal tone throughout; no atrophy noted Sensory: Pinprick and light touch intact throughout, bilaterally Deep Tendon Reflexes: 1+ and symmetric throughout Plantars: Right: downgoing   Left: downgoing Cerebellar: normal finger-to-nose, normal rapid alternating movements and normal heel-to-shin  test Gait: not tested       Laboratory Studies:   Basic Metabolic Panel:  Recent Labs Lab 09/18/15 1332  NA 138  K 5.1  CL 97*  CO2 27  GLUCOSE 91  BUN 36*  CREATININE 3.66*  CALCIUM 9.4    Liver Function Tests:  Recent Labs Lab 09/18/15 1332  AST 40  ALT 17  ALKPHOS 127*  BILITOT 1.1  PROT 8.4*  ALBUMIN 4.5   No results for input(s): LIPASE, AMYLASE in the last 168 hours. No results for input(s): AMMONIA in the last 168 hours.  CBC:  Recent Labs Lab 09/18/15 1332  WBC 9.5  HGB 13.7  HCT 40.7  MCV 100.5*  PLT 132*    Cardiac Enzymes:  Recent Labs Lab 09/18/15 1337  TROPONINI 0.07*    BNP: Invalid input(s): POCBNP  CBG:  Recent Labs Lab 09/19/15 1140  GLUCAP 114*    Microbiology: Results for orders placed or performed during the hospital encounter of 09/18/15  Blood culture (routine x 2)     Status: None (Preliminary result)   Collection Time: 09/18/15  2:01 PM  Result Value Ref Range Status   Specimen Description BLOOD RIGHT FOREARM  Final   Special Requests BOTTLES DRAWN AEROBIC AND ANAEROBIC AER4CC ANA 4CC  Final   Culture NO GROWTH < 24 HOURS  Final   Report Status PENDING  Incomplete  Blood culture (routine x 2)     Status: None (Preliminary result)   Collection Time: 09/18/15  2:02 PM  Result Value Ref Range Status   Specimen Description BLOOD RIGHT HAND  Final   Special Requests   Final    BOTTLES DRAWN AEROBIC AND ANAEROBIC AER [place] ANA 3CC   Culture NO GROWTH < 24 HOURS  Final   Report Status PENDING  Incomplete  MRSA PCR Screening     Status: None   Collection Time: 09/18/15  7:25 PM  Result Value Ref Range Status   MRSA by PCR NEGATIVE NEGATIVE Final    Comment:        The GeneXpert MRSA Assay (FDA approved for NASAL specimens only), is one component of a comprehensive MRSA colonization surveillance program. It is not intended to diagnose MRSA infection nor to guide or monitor treatment for MRSA infections.      Coagulation Studies: No results for input(s): LABPROT, INR in the last 72 hours.  Urinalysis:  Recent Labs Lab 09/18/15 1412  COLORURINE YELLOW*  LABSPEC 1.009  PHURINE 7.0  GLUCOSEU NEGATIVE  HGBUR NEGATIVE  BILIRUBINUR NEGATIVE  KETONESUR NEGATIVE  PROTEINUR 100*  NITRITE NEGATIVE  LEUKOCYTESUR 3+*    Lipid Panel:     Component Value Date/Time   CHOL 167 05/21/2015 0943   TRIG 142.0 05/21/2015 0943   TRIG 236 (HH) 01/13/2006 0000   HDL 43.80 05/21/2015 0943   CHOLHDL 4 05/21/2015 0943   VLDL 28.4 05/21/2015 0943   LDLCALC 95 05/21/2015 0943    HgbA1C:  Lab Results  Component Value Date   HGBA1C 5.8 05/21/2015    Urine Drug Screen:  No results found for: LABOPIA, COCAINSCRNUR, LABBENZ, AMPHETMU, THCU, LABBARB  Alcohol Level: No results for input(s): ETH in the last 168 hours.   Imaging: Ct Head Wo Contrast  Result Date: 09/18/2015 CLINICAL DATA:  Altered mental status. EXAM: CT HEAD WITHOUT CONTRAST TECHNIQUE: Contiguous axial images were obtained from the base of the skull through the vertex without intravenous contrast. COMPARISON:  CT scan of March 22, 2013. FINDINGS: Bony calvarium is intact. Visualized paranasal sinuses appear normal. Mild diffuse cortical atrophy is noted. Mild chronic ischemic white matter disease is noted. Old lacunar infarction is noted in right basal ganglia. New right posterior parietal low density is noted concerning for infarction of indeterminate age. No hemorrhage or mass lesion is noted. No mass effect or midline shift is noted. Ventricular size is within normal limits. IMPRESSION: Mild diffuse cortical atrophy. Mild chronic ischemic white matter disease. New right posterior parietal low density is noted concerning for infarction of indeterminate age. MRI is recommended for further evaluation. Critical Value/emergent results were called by telephone at the time of interpretation on 09/18/2015 at 2:01 pm to Dr. Loura Pardon , who  verbally acknowledged these results. Electronically Signed   By: Marijo Conception, M.D.   On: 09/18/2015 14:01   Mr Brain Wo Contrast  Result Date: 09/19/2015 CLINICAL DATA:  Dialysis patient with altered mental status. EXAM: MRI HEAD WITHOUT CONTRAST TECHNIQUE: Multiplanar, multiecho pulse sequences of the brain and surrounding structures were obtained without intravenous contrast. COMPARISON:  CT head 09/18/2015. FINDINGS: Brain: RIGHT posterior parieto-temporo-occipital acute infarction, posterior division RIGHT  MCA territory, involving cortex and subcortical white matter, correlating with the area of cytotoxic edema noted on CT. No other areas of restricted diffusion. LEFT frontal low level increased signal on diffusion image 33 series 6 not clearly restricted on ADC map, favored to represent T2 shine through given the hyperintensity on T2 and FLAIR imaging, although a subacute subcentimeter infarct not completely excluded. No visible acute hemorrhage, mass lesion, or extra-axial fluid. Global atrophy with hydrocephalus ex vacuo. Extensive chronic microvascular ischemic change throughout the white matter. Vascular: Flow voids are maintained in the carotids, basilar, and both vertebral arteries. Chronic hemorrhage is associated with a lacunar infarct in the RIGHT thalamus. Chronic subcortical hemorrhage is seen in the LEFT posterior temporal region. Other tiny foci of chronic hemorrhage throughout the hemispheres and posterior fossa, likely sequelae of hypertensive cerebrovascular disease. Skull and upper cervical spine: Unremarkable visualized calvarium, skullbase, and cervical vertebrae. Pituitary, pineal, cerebellar tonsils unremarkable. No upper cervical cord lesions. Sinuses/Orbits: No orbital masses or proptosis. Globes appear symmetric. Sinuses appear well aerated, without evidence for air-fluid level. Other: No nasopharyngeal pathology or mastoid fluid. Scalp and other visualized extracranial soft  tissues grossly unremarkable. IMPRESSION: Acute RIGHT posterior MCA territory infarct affecting the temporo-parieto-occipital cortex and white matter. No acute hemorrhage is associated. Global atrophy. Multiple foci of chronic hemorrhage along with extensive white matter disease, favored to represent sequelae of hypertensive cerebrovascular disease. No proximal vascular occlusion is evident. Electronically Signed   By: Staci Righter M.D.   On: 09/19/2015 11:33   US Carotid Bilateral  Result Date: 09/19/2015 CLINICAL DATA:  Altered mental status. EXAM: BILATERAL CAROTID DUPLEX ULTRASOUND TECHNIQUE: Pearline Cables scale imaging, color Doppler and duplex ultrasound were performed of bilateral carotid and vertebral arteries in the neck. COMPARISON:  None. FINDINGS: Criteria: Quantification of carotid stenosis is based on velocity parameters that correlate the residual internal carotid diameter with NASCET-based stenosis levels, using the diameter of the distal internal carotid lumen as the denominator for stenosis measurement. The following velocity measurements were obtained: RIGHT ICA:  106/19 cm/sec CCA:  [id number] cm/sec SYSTOLIC ICA/CCA RATIO:  1.1 DIASTOLIC ICA/CCA RATIO:  1.4 ECA:  100 cm/sec LEFT ICA:  127/27 cm/sec CCA:  [id number] cm/sec SYSTOLIC ICA/CCA RATIO:  0.8 DIASTOLIC ICA/CCA RATIO:  2.6 ECA:  174 cm/sec RIGHT CAROTID ARTERY: Status post right endarterectomy. Mild plaque formation is noted consistent with less than 50% diameter stenosis based on ultrasound and Doppler criteria. RIGHT VERTEBRAL ARTERY:  Antegrade flow is noted. LEFT CAROTID ARTERY: Large irregular and partially calcified plaque is noted in the midportion of the left common carotid artery. Calcified moderate plaque is noted in the proximal left internal carotid artery. Maximum measured velocities are consistent with 50-69% stenosis based on ultrasound and Doppler criteria. However, due to the extensive calcification, portions of the lumen are not well  visualized and more significant stenosis cannot be excluded. LEFT VERTEBRAL ARTERY:  Antegrade flow is noted. IMPRESSION: Status post right carotid endarterectomy. Mild plaque formation is noted in the proximal right internal carotid artery consistent with less than 50% diameter stenosis based on ultrasound and Doppler criteria. Large irregular and partially calcified plaque is noted in the midportion of the left common carotid artery. Heavily calcified plaque is noted in the proximal left internal carotid artery consistent with 50-69% stenosis based on ultrasound and Doppler criteria. However, due to the extensive calcifications, portions of the lumen are not visualized and more significant stenosis cannot be excluded. CT angiography is recommended for further evaluation. Electronically Signed  By: Marijo Conception, M.D.   On: 09/19/2015 10:47   Dg Chest Portable 1 View  Result Date: 09/18/2015 CLINICAL DATA:  82 year old female with altered mental status. Dialysis patient. Initial encounter. EXAM: PORTABLE CHEST 1 VIEW COMPARISON:  03/22/2013 and earlier. FINDINGS: Portable AP view at 1419 hours. Stable cardiomegaly and mediastinal contours. Sequelae of CABG and cardiac valve replacement. Calcified aortic atherosclerosis. New left distal subclavian/axillary region metallic vascular stent since 2015. Calcified apical lung scarring. No pneumothorax, pulmonary edema, pleural effusion or acute pulmonary opacity. IMPRESSION: No acute cardiopulmonary abnormality. Electronically Signed   By: Genevie Ann M.D.   On: 09/18/2015 14:29     Assessment/Plan: 82 y.o. female  with a known history of Incisional disease on hemodialysis, hypertension, aortic stenosis, diabetes, hyperlipidemia and CAD comes to the emergency room from dialysis for altered mental status. According to the staff at dialysis patient arrived around 10:30 AM yesterday she wasn't quite herself. She became increasingly altered and was noted to have blood  pressure elevated.  As the day progressed her mentation has improved. She was found to have R Parietal infarct that is likely subacute.    Pt does have a R parietal stroke on CTH and MRI, likely subacute in nature.  Her mentation much improved and she knows her current location Agree with transfer from ICU Change ASA to plavix No further imaging from neuro stand point Pt/OT Would consider driving restriction.  09/19/2015, 1:19 PM

## 2015-09-19 NOTE — Consult Note (Signed)
[facility]  CONSULT NOTE    Date: 09/19/2015                  Patient Name:  [name]  MRN: [id number]  DOB: Nov 08, 1920  Age / Sex: 82 y.o., female         PCP: Leotis Pain, MD                 Service Requesting Consult: Dr. Verdell Carmine                 Reason for Consult: End Stage Renal Disease            History of Present Illness: [name] is a 82 y.o. white female with end stage renal disease MWF, hypertension, hyperlipidemia, hypothyroidism, diabetes mellitus type II, aortic stenosis, congestive heart failure , who was admitted to [facility] on 09/18/2015 for Altered mental state [R41.82] UTI (lower urinary tract infection) [N39.0] Essential hypertension [I10] Cerebral infarction due to unspecified mechanism [I63.9] Sepsis, due to unspecified organism ([place]) [A41.9] Altered mental status, unspecified altered mental status type [R41.82]  Patient got one hour of dialysis yesterday when she started to get confused and weak. Concern for stroke and sent to the ED. She was found to have an acute right MCA CVA with left sided weakness.   She was also treated with IV fluids and antibiotics.     Medications: Outpatient medications: Prescriptions Prior to Admission  Medication Sig Dispense Refill Last Dose  . aspirin EC 81 MG tablet Take 81 mg by mouth at bedtime.   Past Week at Unknown time  . calcium carbonate (TUMS - DOSED IN MG ELEMENTAL CALCIUM) 500 MG chewable tablet Chew 1 tablet (200 mg of elemental calcium total) by mouth 3 (three) times daily with meals.   Taking  . folic acid (FOLVITE) 0.5 MG tablet Take 0.5 tablets (0.5 mg total) by mouth daily. (Patient taking differently: Take 0.5 mg by mouth every evening. )   Past Week at Unknown time  . levothyroxine (SYNTHROID, LEVOTHROID) 75 MCG tablet TAKE ONE TABLET BY MOUTH ONCE DAILY 30 tablet 11 Past Week at Unknown time  . metoprolol (LOPRESSOR) 50 MG tablet Take 1 tablet (50 mg total) by mouth 2  (two) times daily. (Patient taking differently: Take 50 mg by mouth at bedtime. Take twice a day on Tuesdays, Thursdays, Saturdays and Sundays. Take only in the evening on Mondays, Wednesdays and Fridays.) 180 tablet 3 Taking  . pravastatin (PRAVACHOL) 40 MG tablet Take 1 tablet (40 mg total) by mouth every evening. 90 tablet 4 Past Week at Unknown time  . vitamin C (VITAMIN C) 500 MG tablet Take 1 tablet (500 mg total) by mouth daily.   Past Week at Unknown time    Current medications: Current Facility-Administered Medications  Medication Dose Route Frequency Provider Last Rate Last Dose  . 0.9 %  sodium chloride infusion   Intravenous Continuous Lavonia Dana, MD   Stopped at 09/19/15 0930  . acetaminophen (TYLENOL) tablet 650 mg  650 mg Oral Q6H PRN Fritzi Mandes, MD       Or  . acetaminophen (TYLENOL) suppository 650 mg  650 mg Rectal Q6H PRN Fritzi Mandes, MD      . cefTRIAXone (ROCEPHIN) 1 g in dextrose 5 % 50 mL IVPB  1 g Intravenous Q24H Fritzi Mandes, MD 100 mL/hr at 09/18/15 1729 1 g at 09/18/15 1729  . clopidogrel (PLAVIX) tablet 75 mg  75 mg  Oral Daily Henreitta Leber, MD      . heparin injection 5,000 Units  5,000 Units Subcutaneous Q8H Fritzi Mandes, MD   5,000 Units at 09/19/15 0834  . hydrALAZINE (APRESOLINE) injection 10-20 mg  10-20 mg Intravenous Q6H PRN Bincy S Varughese, NP   20 mg at 09/19/15 1126  . insulin aspart (novoLOG) injection 0-9 Units  0-9 Units Subcutaneous Q4H Flora Lipps, MD      . Derrill Memo ON 09/20/2015] levothyroxine (SYNTHROID, LEVOTHROID) tablet 75 mcg  75 mcg Oral QAC breakfast Henreitta Leber, MD      . metoprolol (LOPRESSOR) tablet 50 mg  50 mg Oral 2 times per day on Sun Tue Thu Sat Henreitta Leber, MD      . Derrill Memo ON 09/20/2015] metoprolol (LOPRESSOR) tablet 50 mg  50 mg Oral Once per day on Mon Wed Fri Henreitta Leber, MD      . ondansetron [facility]) tablet 4 mg  4 mg Oral Q6H PRN Fritzi Mandes, MD       Or  . ondansetron (ZOFRAN) injection 4 mg  4 mg Intravenous Q6H  PRN Fritzi Mandes, MD   4 mg at 09/19/15 [id number]  . pravastatin (PRAVACHOL) tablet 40 mg  40 mg Oral QPM Henreitta Leber, MD          Allergies: Allergies  Allergen Reactions  . Nitrofurantoin Itching  . Captopril Other (See Comments)    REACTION: unspecified  . Enalapril Maleate Cough  . Ramipril Other (See Comments)    REACTION: unspecified  . Sulfa Antibiotics Other (See Comments)    Reaction unknown  . Verapamil Other (See Comments)    REACTION: unspecified      Past Medical History: Past Medical History:  Diagnosis Date  . Anemia    NOS  . Anxiety   . Aortic stenosis   . Cancer [facility])    Mole on top of head to be removed in July 2013  . CHF (congestive heart failure) ([place])   . Constipation   . Coronary artery disease   . Diabetes mellitus    type II;was on Glipizide but has been off x 9mon  . Dialysis patient ([place])   . Diarrhea   . GERD (gastroesophageal reflux disease)   . History of blood transfusion   . Hyperlipidemia    takes Simvastatin nightly  . Hyperlipidemia   . Hypertension    takes Metoprolol daily  . Hypothyroidism    takes Synthroid daily  . Migraine    hx of  . Oligouria   . Osteoarthritis   . Osteopenia   . Peripheral vascular disease ([place])   . Personal history of colonic polyps   . Pulmonary hypertension ([place])   . Renal insufficiency   . Urinary incontinence   . UTI (urinary tract infection)      Past Surgical History: Past Surgical History:  Procedure Laterality Date  . ABDOMINAL HYSTERECTOMY    . adenosine myoview  2007   benign, EF 69%  . AMPUTATION  06/18/2011   Procedure: AMPUTATION DIGIT;  Surgeon: Newt Minion, MD;  Location: [place];  Service: Orthopedics;  Laterality: Left;  Left 2nd Toe Amputation at MTP Joint  . Amputation-left great toe  7/12   Dr Marlou Sa  . AORTIC VALVE REPLACEMENT  2009  . APPENDECTOMY    . AV FISTULA PLACEMENT  12/22/2010   Procedure: ARTERIOVENOUS (AV) FISTULA CREATION;  Surgeon: Hinda Lenis,  MD;  Location: [place];  Service: Vascular;  Laterality: Left;  Creation of Left Brachial-Cephalic Fistula  . CARDIAC CATHETERIZATION  2000   cad  . CAROTID ENDARTERECTOMY  2011   Right  . CATARACT EXTRACTION    . CORONARY ARTERY BYPASS GRAFT     od  . CORONARY ARTERY BYPASS GRAFT  2009  . EYE SURGERY     BIlateral  . INSERTION OF DIALYSIS CATHETER  12/18/2010   Procedure: INSERTION OF DIALYSIS CATHETER;  Surgeon: Mal Misty, MD;  Location: [place];  Service: Vascular;  Laterality: Right;  . REPLACEMENT TOTAL KNEE BILATERAL  05/1998  . TONSILLECTOMY    . Traumatic Amputation of Right DIP joint of Index Finger       Family History: Family History  Problem Relation Age of Onset  . Family history unknown: Yes     Social History: Social History   Social History  . Marital status: Widowed    Spouse name: [name]  . Number of children: 1  . Years of education: [name]   Occupational History  . 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 Smoking status: Former Smoker    Years: 15.00    Types: Cigarettes    Quit date: 01/06/1943  . Smokeless tobacco: Never Used  . Alcohol use Yes     Comment: Wine occasionally  . Drug use: No  . Sexual activity: No   Other Topics Concern  . Not on file   Social History Narrative   Son Evlyn Clines to make decisions for her   Discussed DNR and she requests --done 05/03/14   No tube feeds if cognitively unaware     Review of Systems: Review of Systems  Unable to perform ROS: Medical condition    Vital Signs: Blood pressure (!) 175/54, pulse 86, temperature 98.3 F (36.8 C), temperature source Oral, resp. rate 18, height 5' (1.524 m), weight 52.6 kg (116 lb), SpO2 97 %.  Weight trends: Filed Weights   09/18/15 1325  Weight: 52.6 kg (116 lb)    Physical Exam: General: NAD, laying in bed  Head: Normocephalic, atraumatic. Moist oral mucosal membranes  Eyes: Anicteric, PERRL  Neck: Supple, trachea midline   Lungs:  Clear to auscultation  Heart: Regular rate and rhythm  Abdomen:  Soft, nontender,   Extremities: no peripheral edema.  Neurologic: Nonfocal, moving all four extremities, left sided weakness  Skin: No lesions  Access: Left arm AVF     Lab results: Basic Metabolic Panel:  Recent Labs Lab 09/18/15 1332  NA 138  K 5.1  CL 97*  CO2 27  GLUCOSE 91  BUN 36*  CREATININE 3.66*  CALCIUM 9.4    Liver Function Tests:  Recent Labs Lab 09/18/15 1332  AST 40  ALT 17  ALKPHOS 127*  BILITOT 1.1  PROT 8.4*  ALBUMIN 4.5   No results for input(s): LIPASE, AMYLASE in the last 168 hours. No results for input(s): AMMONIA in the last 168 hours.  CBC:  Recent Labs Lab 09/18/15 1332  WBC 9.5  HGB 13.7  HCT 40.7  MCV 100.5*  PLT 132*    Cardiac Enzymes:  Recent Labs Lab 09/18/15 1337  TROPONINI 0.07*    BNP: Invalid input(s): POCBNP  CBG:  Recent Labs Lab 09/19/15 1140  GLUCAP 114*    Microbiology: Results for orders placed or performed during the hospital encounter of 09/18/15  Blood culture (routine x 2)     Status: None (Preliminary result)   Collection Time: 09/18/15  2:01 PM  Result Value Ref Range Status   Specimen Description BLOOD RIGHT FOREARM  Final   Special Requests BOTTLES DRAWN AEROBIC AND ANAEROBIC AER4CC ANA 4CC  Final   Culture NO GROWTH < 24 HOURS  Final   Report Status PENDING  Incomplete  Blood culture (routine x 2)     Status: None (Preliminary result)   Collection Time: 09/18/15  2:02 PM  Result Value Ref Range Status   Specimen Description BLOOD RIGHT HAND  Final   Special Requests   Final    BOTTLES DRAWN AEROBIC AND ANAEROBIC AER [place] ANA 3CC   Culture NO GROWTH < 24 HOURS  Final   Report Status PENDING  Incomplete  Urine culture     Status: Abnormal (Preliminary result)   Collection Time: 09/18/15  2:12 PM  Result Value Ref Range Status   Specimen Description URINE, RANDOM  Final   Special Requests NONE  Final    Culture >=100,000 COLONIES/mL GRAM NEGATIVE RODS (A)  Final   Report Status PENDING  Incomplete  MRSA PCR Screening     Status: None   Collection Time: 09/18/15  7:25 PM  Result Value Ref Range Status   MRSA by PCR NEGATIVE NEGATIVE Final    Comment:        The GeneXpert MRSA Assay (FDA approved for NASAL specimens only), is one component of a comprehensive MRSA colonization surveillance program. It is not intended to diagnose MRSA infection nor to guide or monitor treatment for MRSA infections.     Coagulation Studies: No results for input(s): LABPROT, INR in the last 72 hours.  Urinalysis:  Recent Labs  09/18/15 1412  COLORURINE YELLOW*  LABSPEC 1.009  PHURINE 7.0  GLUCOSEU NEGATIVE  HGBUR NEGATIVE  BILIRUBINUR NEGATIVE  KETONESUR NEGATIVE  PROTEINUR 100*  NITRITE NEGATIVE  LEUKOCYTESUR 3+*      Imaging: Ct Head Wo Contrast  Result Date: 09/18/2015 CLINICAL DATA:  Altered mental status. EXAM: CT HEAD WITHOUT CONTRAST TECHNIQUE: Contiguous axial images were obtained from the base of the skull through the vertex without intravenous contrast. COMPARISON:  CT scan of March 22, 2013. FINDINGS: Bony calvarium is intact. Visualized paranasal sinuses appear normal. Mild diffuse cortical atrophy is noted. Mild chronic ischemic white matter disease is noted. Old lacunar infarction is noted in right basal ganglia. New right posterior parietal low density is noted concerning for infarction of indeterminate age. No hemorrhage or mass lesion is noted. No mass effect or midline shift is noted. Ventricular size is within normal limits. IMPRESSION: Mild diffuse cortical atrophy. Mild chronic ischemic white matter disease. New right posterior parietal low density is noted concerning for infarction of indeterminate age. MRI is recommended for further evaluation. Critical Value/emergent results were called by telephone at the time of interpretation on 09/18/2015 at 2:01 pm to Dr. Loura Pardon  , who verbally acknowledged these results. Electronically Signed   By: Marijo Conception, M.D.   On: 09/18/2015 14:01   Mr Brain Wo Contrast  Result Date: 09/19/2015 CLINICAL DATA:  Dialysis patient with altered mental status. EXAM: MRI HEAD WITHOUT CONTRAST TECHNIQUE: Multiplanar, multiecho pulse sequences of the brain and surrounding structures were obtained without intravenous contrast. COMPARISON:  CT head 09/18/2015. FINDINGS: Brain: RIGHT posterior parieto-temporo-occipital acute infarction, posterior division RIGHT MCA territory, involving cortex and subcortical white matter, correlating with the area of cytotoxic edema noted on CT. No other areas of restricted diffusion. LEFT frontal low level increased signal on diffusion image 33 series 6 not  clearly restricted on ADC map, favored to represent T2 shine through given the hyperintensity on T2 and FLAIR imaging, although a subacute subcentimeter infarct not completely excluded. No visible acute hemorrhage, mass lesion, or extra-axial fluid. Global atrophy with hydrocephalus ex vacuo. Extensive chronic microvascular ischemic change throughout the white matter. Vascular: Flow voids are maintained in the carotids, basilar, and both vertebral arteries. Chronic hemorrhage is associated with a lacunar infarct in the RIGHT thalamus. Chronic subcortical hemorrhage is seen in the LEFT posterior temporal region. Other tiny foci of chronic hemorrhage throughout the hemispheres and posterior fossa, likely sequelae of hypertensive cerebrovascular disease. Skull and upper cervical spine: Unremarkable visualized calvarium, skullbase, and cervical vertebrae. Pituitary, pineal, cerebellar tonsils unremarkable. No upper cervical cord lesions. Sinuses/Orbits: No orbital masses or proptosis. Globes appear symmetric. Sinuses appear well aerated, without evidence for air-fluid level. Other: No nasopharyngeal pathology or mastoid fluid. Scalp and other visualized extracranial  soft tissues grossly unremarkable. IMPRESSION: Acute RIGHT posterior MCA territory infarct affecting the temporo-parieto-occipital cortex and white matter. No acute hemorrhage is associated. Global atrophy. Multiple foci of chronic hemorrhage along with extensive white matter disease, favored to represent sequelae of hypertensive cerebrovascular disease. No proximal vascular occlusion is evident. Electronically Signed   By: Staci Righter M.D.   On: 09/19/2015 11:33   US Carotid Bilateral  Result Date: 09/19/2015 CLINICAL DATA:  Altered mental status. EXAM: BILATERAL CAROTID DUPLEX ULTRASOUND TECHNIQUE: Pearline Cables scale imaging, color Doppler and duplex ultrasound were performed of bilateral carotid and vertebral arteries in the neck. COMPARISON:  None. FINDINGS: Criteria: Quantification of carotid stenosis is based on velocity parameters that correlate the residual internal carotid diameter with NASCET-based stenosis levels, using the diameter of the distal internal carotid lumen as the denominator for stenosis measurement. The following velocity measurements were obtained: RIGHT ICA:  106/19 cm/sec CCA:  [id number] cm/sec SYSTOLIC ICA/CCA RATIO:  1.1 DIASTOLIC ICA/CCA RATIO:  1.4 ECA:  100 cm/sec LEFT ICA:  127/27 cm/sec CCA:  [id number] cm/sec SYSTOLIC ICA/CCA RATIO:  0.8 DIASTOLIC ICA/CCA RATIO:  2.6 ECA:  174 cm/sec RIGHT CAROTID ARTERY: Status post right endarterectomy. Mild plaque formation is noted consistent with less than 50% diameter stenosis based on ultrasound and Doppler criteria. RIGHT VERTEBRAL ARTERY:  Antegrade flow is noted. LEFT CAROTID ARTERY: Large irregular and partially calcified plaque is noted in the midportion of the left common carotid artery. Calcified moderate plaque is noted in the proximal left internal carotid artery. Maximum measured velocities are consistent with 50-69% stenosis based on ultrasound and Doppler criteria. However, due to the extensive calcification, portions of the lumen are not  well visualized and more significant stenosis cannot be excluded. LEFT VERTEBRAL ARTERY:  Antegrade flow is noted. IMPRESSION: Status post right carotid endarterectomy. Mild plaque formation is noted in the proximal right internal carotid artery consistent with less than 50% diameter stenosis based on ultrasound and Doppler criteria. Large irregular and partially calcified plaque is noted in the midportion of the left common carotid artery. Heavily calcified plaque is noted in the proximal left internal carotid artery consistent with 50-69% stenosis based on ultrasound and Doppler criteria. However, due to the extensive calcifications, portions of the lumen are not visualized and more significant stenosis cannot be excluded. CT angiography is recommended for further evaluation. Electronically Signed   By: Marijo Conception, M.D.   On: 09/19/2015 10:47   Dg Chest Portable 1 View  Result Date: 09/18/2015 CLINICAL DATA:  82 year old female with altered mental status. Dialysis patient. Initial encounter. EXAM:  PORTABLE CHEST 1 VIEW COMPARISON:  03/22/2013 and earlier. FINDINGS: Portable AP view at 1419 hours. Stable cardiomegaly and mediastinal contours. Sequelae of CABG and cardiac valve replacement. Calcified aortic atherosclerosis. New left distal subclavian/axillary region metallic vascular stent since 2015. Calcified apical lung scarring. No pneumothorax, pulmonary edema, pleural effusion or acute pulmonary opacity. IMPRESSION: No acute cardiopulmonary abnormality. Electronically Signed   By: Genevie Ann M.D.   On: 09/18/2015 14:29      Assessment & Plan: [name] is a 82 y.o. white female with end stage renal disease MWF, hypertension, hyperlipidemia, hypothyroidism, diabetes mellitus type II, aortic stenosis, congestive heart failure , who was admitted to [facility] on 09/18/2015  MWF [facility] [place]  1. End Stage Renal Disease: did not get full dialysis treatment yesterday. No acute  indication for dialysis treatment today. Schedule next treatment for tomorrow.   2. Hypertension: with acute cerebral vascular accident, allow blood pressure to ride higher than baseline.   3. Anemia of chronic kidney disease: macrocytic: hemoglobin above goal and she is getting EPO as outpatient. Hold due to ischemic event and elevated blood pressures.   4. Secondary Hyperparathyroidism: takes calcium carbonate with meals.   LOS: [place], Khushbu Pippen 9/14/20172:37 PM

## 2015-09-19 NOTE — Progress Notes (Signed)
rn called pts son, charlie to inform him that pt would be transferring to the floor.

## 2015-09-19 NOTE — Progress Notes (Signed)
SLP Cancellation Note  Patient Details Name: [name] MRN: [id number] DOB: 1920-09-27   Cancelled treatment:       Reason Eval/Treat Not Completed: Patient at procedure or test/unavailable (will f/u upon return)   [facility] 09/19/2015, 9:51 AM

## 2015-09-19 NOTE — Progress Notes (Signed)
On call MD notified patient takes eye drops PRN daily for dry eyes. Dr. Darvin Neighbours to place order.

## 2015-09-19 NOTE — Plan of Care (Signed)
Problem: SLP Dysphagia Goals Goal: Misc Dysphagia Goal Pt will safely tolerate po diet of least restrictive consistency w/ no overt s/s of aspiration noted by Staff/pt/family x3 sessions.    

## 2015-09-19 NOTE — Care Management Note (Signed)
Case Management Note  Patient Details  Name: [name] MRN: [id number] Date of Birth: Jan 02, 1921  Subjective/Objective:                  Contacted patient's [name] [phone] that lives in [place] but maintains his mothers pill box at her [place] address. She lives alone and independent. She drives herself to heart track here at [facility] on [address], [place]. She uses [facility] on M, W, F to dialysis. She usually uses a cane but a walker for lengthy distances. She is not on home O2. She is able to afford her medications. Her PCP is Dr. Silvio Pate. Son is open to home health services or SNF for rehab. She is usually alert and oriented and he relates this new confusion to "UTI".   Action/Plan:   RNCM to follow for home health. PT evaluation requested.   Expected Discharge Date:                  Expected Discharge Plan:     In-House Referral:     Discharge planning Services  CM Consult  Post Acute Care Choice:  Home Health Choice offered to:  Adult Children  DME Arranged:    DME Agency:     HH Arranged:    HH Agency:     Status of Service:  In process, will continue to follow  If discussed at Long Length of Stay Meetings, dates discussed:    Additional Comments:  Marshell Garfinkel, RN 09/19/2015, 12:15 PM

## 2015-09-20 LAB — GLUCOSE, CAPILLARY
GLUCOSE-CAPILLARY: 147 mg/dL — AB (ref 65–99)
GLUCOSE-CAPILLARY: 97 mg/dL (ref 65–99)
GLUCOSE-CAPILLARY: 82 mg/dL (ref 65–99)
Glucose-Capillary: 212 mg/dL — ABNORMAL HIGH (ref 65–99)
Glucose-Capillary: 163 mg/dL — ABNORMAL HIGH (ref 65–99)

## 2015-09-20 LAB — CBC
HCT: 35 % (ref 35.0–47.0)
Hemoglobin: 11.6 g/dL — ABNORMAL LOW (ref 12.0–16.0)
MCH: 33.4 pg (ref 26.0–34.0)
MCHC: 33.1 g/dL (ref 32.0–36.0)
MCV: 100.9 fL — ABNORMAL HIGH (ref 80.0–100.0)
Platelets: 103 10*3/uL — ABNORMAL LOW (ref 150–440)
RBC: 3.47 MIL/uL — ABNORMAL LOW (ref 3.80–5.20)
RDW: 15.1 % — ABNORMAL HIGH (ref 11.5–14.5)
WBC: 7.5 10*3/uL (ref 3.6–11.0)

## 2015-09-20 LAB — BASIC METABOLIC PANEL
Anion gap: 11 (ref 5–15)
BUN: 53 mg/dL — ABNORMAL HIGH (ref 6–20)
CO2: 22 mmol/L (ref 22–32)
Calcium: 8.7 mg/dL — ABNORMAL LOW (ref 8.9–10.3)
Chloride: 104 mmol/L (ref 101–111)
Creatinine, Ser: 5.77 mg/dL — ABNORMAL HIGH (ref 0.44–1.00)
GFR calc Af Amer: 7 mL/min — ABNORMAL LOW (ref >60)
GFR calc non Af Amer: 6 mL/min — ABNORMAL LOW (ref >60)
Glucose, Bld: 92 mg/dL (ref 65–99)
Potassium: 5.7 mmol/L — ABNORMAL HIGH (ref 3.5–5.1)
Sodium: 137 mmol/L (ref 135–145)

## 2015-09-20 LAB — URINE CULTURE

## 2015-09-20 MED ORDER — CALCIUM CARBONATE ANTACID 500 MG PO CHEW
400.0000 mg | CHEWABLE_TABLET | Freq: Three times a day (TID) | ORAL | Status: DC
  Administered 2015-09-20 – 2015-09-22 (×5): 400 mg via ORAL
  Filled 2015-09-20 (×9): qty 2

## 2015-09-20 MED ORDER — CLONIDINE HCL 0.1 MG PO TABS
0.1000 mg | ORAL_TABLET | Freq: Two times a day (BID) | ORAL | Status: DC
  Administered 2015-09-20 – 2015-09-22 (×5): 0.1 mg via ORAL
  Filled 2015-09-20 (×6): qty 1

## 2015-09-20 NOTE — Progress Notes (Signed)
Post hd tx 

## 2015-09-20 NOTE — Evaluation (Signed)
Occupational Therapy Evaluation Patient Details Name: [name] MRN: [id number] DOB: 11-May-1920 Today's Date: 09/20/2015    History of Present Illness [name]  is a 82 y.o. female admitted with acute CVA, she is on hemodialysis, h/o hypertension, aortic stenosis, diabetes, hyperlipidemia and CAD comes to the emergency room from dialysis for altered mental status. According to the staff at dialysis patient arrived around 10:30 AM she wasn't quite herself. She became increasingly altered and was noted to have blood pressure elevated.   Clinical Impression   Pt. Is a 82 y.o. Female pt. Presents with weakness, decreased functional mobility, and impaired safety awareness which hinders her ability to complete ADL and IADL functioning. Pt. Could benefit from skilled OT services for ADL training, there. Ex., neuromuscular re-ed, and pt. education about work simplification techniques, and home modification. Pt. Plans to go to SNF level of care. Follow-up OT services are recommended.    Follow Up Recommendations  SNF    Equipment Recommendations       Recommendations for Other Services PT consult     Precautions / Restrictions Precautions Precautions: Fall Restrictions Weight Bearing Restrictions: No      Mobility Bed Mobility Overal bed mobility: Needs Assistance Bed Mobility: Supine to Sit;Sit to Supine     Supine to sit: Min assist Sit to supine: Min assist     Transfers Overall transfer level: Modified independent Equipment used: Straight cane             General transfer comment: Pt able to rise to standing with close supervision, she was initially unsteady and needed heavy assist with SPC to maintain balance    Balance Overall balance assessment: Needs assistance   Sitting balance-Leahy Scale: Good       Standing balance-Leahy Scale: Fair                              ADL Overall ADL's : Needs assistance/impaired Eating/Feeding:  Independent;Set up   Grooming: Set up               Lower Body Dressing: Min guard               Functional mobility during ADLs: Supervision/safety;Min guard       Vision     Perception     Praxis      Pertinent Vitals/Pain Pain Assessment: No/denies pain Pain Score: 0-No pain     Hand Dominance Right   Extremity/Trunk Assessment Upper Extremity Assessment Upper Extremity Assessment: Overall WFL for tasks assessed          Communication Communication Communication: HOH   Cognition Arousal/Alertness: Awake/alert Behavior During Therapy: WFL for tasks assessed/performed Overall Cognitive Status: Within Functional Limits for tasks assessed                     General Comments       Exercises       Shoulder Instructions      Home Living Family/patient expects to be discharged to:: Private residence Living Arrangements: Alone Available Help at Discharge: Family;Available PRN/intermittently (son checks in regularly) Type of Home: House Home Access: Stairs to enter [employer] of Steps: 4 Entrance Stairs-Rails: Right;Left Home Layout: One level;Laundry or work area in basement     [employer] Shower/Tub: Teacher, early years/pre: Standard Bathroom Accessibility: Yes How Accessible: Accessible via wheelchair [place] - single point;Walker - 2 wheels;Walker -  4 wheels;Bedside commode          Prior Functioning/Environment Level of Independence: Independent;Independent with assistive device(s)        Comments: with SPC, reports she was able to work in the yard, go out with son occasionally, did not always need walker    OT Diagnosis:   Altered mental status, unspecified altered mental status type  Sepsis, due to unspecified organism [facility])  Cerebral infarction due to unspecified mechanism  Essential hypertension  UTI (lower urinary tract infection)  Altered mental state - Plan: MR Brain Wo  Contrast, US Carotid Bilateral  Muscle weakness (generalized)    OT Problem List: Decreased strength;Decreased activity tolerance;Impaired balance (sitting and/or standing);Decreased knowledge of use of DME or AE;Decreased safety awareness   OT Treatment/Interventions: Self-care/ADL training;Therapeutic exercise;Therapeutic activities;Patient/family education    OT Goals(Current goals can be found in the care plan section) Acute Rehab OT Goals Patient Stated Goal: wants to go home, realizes that may not be safe` OT Goal Formulation: With patient Potential to Achieve Goals: Good  OT Frequency: Min 1X/week   Barriers to D/C:  Resides home alone          Co-evaluation              End of Session    Activity Tolerance: Patient tolerated treatment well Patient left: in bed;with call bell/phone within reach;with bed alarm set   Time: 1020-1045 OT Time Calculation (min): 25 min Charges:  OT General Charges $OT Visit: 1 Procedure OT Evaluation $OT Eval Moderate Complexity: 1 Procedure G-Codes:    Harrel Carina, MS, OTR/L 09/20/2015, 11:40 AM

## 2015-09-20 NOTE — Care Management Important Message (Signed)
Important Message  Patient Details  Name: MARISELLA SAGGIO MRN: [id number] Date of Birth: 12/05/1920   Medicare Important Message Given:  Yes    Shelbie Ammons, RN 09/20/2015, 8:15 AM

## 2015-09-20 NOTE — Progress Notes (Signed)
Attempted to speak to patient she was asleep. CSW will try at a later time.  Ernest Pine, MSW, LCSW, [place] Clinical Social Worker [phone]

## 2015-09-20 NOTE — Clinical Social Work Note (Signed)
Clinical Social Work Assessment  Patient Details  Name: [name] MRN: [id number] Date of Birth: 03/07/1920  Date of referral:  09/20/15               Reason for consult:  Discharge Planning                Permission sought to share information with:  Family Supports Permission granted to share information::     Name::        Agency::     Relationship::   (Charile- Son)  Sport and exercise psychologist Information:     Housing/Transportation Living arrangements for the past 2 months:  Single Family Home Source of Information:  Adult Children Patient Interpreter Needed:  None Criminal Activity/Legal Involvement Pertinent to Current Situation/Hospitalization:  No - Comment as needed Significant Relationships:  Adult Children (Charile- Son) Lives with:  Self Do you feel safe going back to the place where you live?  Yes Need for family participation in patient care:  Yes (Comment) (Charile- Son)  Care giving concerns:  PT and OT are recommending STR for patient.    Social Worker assessment / plan:  CSW attempted to speak to patient but she was out of the room. CSW contacted patient's son [name]. Per [name] he feels that patient can benefit from STR at discharge. Stated that patient is weak and would need to build her strength back. Granted CSW verbal permission to send SNF referral to SNFs in [place]. FL2/ PASRR completed and faxed to SNFs in [facility]. Awaiting bed offers. CSW will continue to follow and assist.   Employment status:  Retired Nurse, adult PT Recommendations:  [place] / Referral to community resources:  [place]  Patient/Family's Response to care:  Patient's son is in agreement for patient to go to STR at discharge.   Patient/Family's Understanding of and Emotional Response to Diagnosis, Current Treatment, and Prognosis:  Patient's son reports that he understands Diagnosis, Current Treatment, and  Prognosis. Thanked CSW for her assistance.   Emotional Assessment Appearance:  Appears stated age Attitude/Demeanor/Rapport:  Unable to Assess Affect (typically observed):  Unable to Assess Orientation:   (Unable to Assess) Alcohol / Substance use:  Not Applicable Psych involvement (Current and /or in the community):  No (Comment)  Discharge Needs  Concerns to be addressed:  Discharge Planning Concerns Readmission within the last 30 days:  No Current discharge risk:  Chronically ill Barriers to Discharge:  Continued Medical Work up   [employer], [place] 09/20/2015, 1:58 PM

## 2015-09-20 NOTE — Progress Notes (Signed)
Pre-hd tx 

## 2015-09-20 NOTE — Evaluation (Signed)
Physical Therapy Evaluation Patient Details Name: [name] MRN: [id number] DOB: 1920/04/27 Today's Date: 09/20/2015   History of Present Illness  [name]  is a 82 y.o. female admitted with acute CVA, she is on hemodialysis, h/o hypertension, aortic stenosis, diabetes, hyperlipidemia and CAD comes to the emergency room from dialysis for altered mental status. According to the staff at dialysis patient arrived around 10:30 AM she wasn't quite herself. She became increasingly altered and was noted to have blood pressure elevated.  Clinical Impression  Pt is able to ambulate with SPC with close assist and did need a few small direct physical assist moments to maintain balance.  She is very HOH but very pleasant t/o the session and shows good effort.  Pt reports that she would like to go home but realizes she may not quite be safe at home and is willing to go to rehab.  Pt shows relatively good strength in b/l U&LEs but has decreased safety with standing/ambulation.     Follow Up Recommendations      Equipment Recommendations       Recommendations for Other Services       Precautions / Restrictions Precautions Precautions: Fall Restrictions Weight Bearing Restrictions: No      Mobility  Bed Mobility Overal bed mobility: Needs Assistance Bed Mobility: Supine to Sit;Sit to Supine     Supine to sit: Min assist Sit to supine: Min assist   General bed mobility comments: Pt able to get to EOB w/o direct assist, had some minimal difficulty getting fully upright  Transfers Overall transfer level: Modified independent Equipment used: Straight cane             General transfer comment: Pt able to rise to standing with close supervision, she was initially unsteady and needed heavy assist with SPC to maintain balance  Ambulation/Gait Ambulation/Gait assistance: Min assist Ambulation Distance (Feet): 50 Feet Assistive device: Straight cane       General Gait Details: Pt  was inconsistent with ambulation.  She was able to ambulate much of the time w/o assist, but ultimately did have multiple small LOBs needing assist to maintain balance  Stairs            Wheelchair Mobility    Modified Rankin (Stroke Patients Only)       Balance Overall balance assessment: Needs assistance   Sitting balance-Leahy Scale: Good       Standing balance-Leahy Scale: Fair                               Pertinent Vitals/Pain Pain Assessment: No/denies pain Pain Score: 0-No pain    Home Living Family/patient expects to be discharged to:: Private residence Living Arrangements: Alone Available Help at Discharge: Family;Available PRN/intermittently (son checks in regularly) Type of Home: House Home Access: Stairs to enter Entrance Stairs-Rails: Psychiatric nurse of Steps: 4 Home Layout: One level;Laundry or work area in [place]: [name] - single point;Walker - 2 wheels;Walker - 4 wheels;Bedside commode      Prior Function Level of Independence: Independent;Independent with assistive device(s)         Comments: with SPC, reports she was able to work in the yard, go out with son occasionally, did not always need walker     Hand Dominance   Dominant Hand: Right    Extremity/Trunk Assessment   Upper Extremity Assessment: Overall WFL for tasks assessed  Lower Extremity Assessment: Overall WFL for tasks assessed         Communication   Communication: HOH  Cognition Arousal/Alertness: Awake/alert Behavior During Therapy: WFL for tasks assessed/performed Overall Cognitive Status: Within Functional Limits for tasks assessed                      General Comments      Exercises     Assessment/Plan    PT Assessment Patient needs continued PT services  PT Problem List Decreased strength;Decreased activity tolerance;Decreased balance;Decreased mobility;Decreased coordination        PT Diagnosis   Altered mental status, unspecified altered mental status type  Sepsis, due to unspecified organism [facility])  Cerebral infarction due to unspecified mechanism  Essential hypertension  UTI (lower urinary tract infection)  Altered mental state - Plan: MR Brain Wo Contrast, US Carotid Bilateral  Muscle weakness (generalized)    PT Treatment Interventions DME instruction;Gait training;Stair training;Functional mobility training;Therapeutic exercise;Therapeutic activities;Balance training;Patient/family education    PT Goals (Current goals can be found in the Care Plan section)  Acute Rehab PT Goals Patient Stated Goal: wants to go home, realizes that may not be safe` PT Goal Formulation: With patient Time For Goal Achievement: 10/04/15 Potential to Achieve Goals: Fair    Frequency 7X/week   Barriers to discharge        Co-evaluation               End of Session Equipment Utilized During Treatment: Gait belt Activity Tolerance: Patient tolerated treatment well Patient left: with bed alarm set;with call bell/phone within reach Nurse Communication: Mobility status         Time: [id number] PT Time Calculation (min) (ACUTE ONLY): 15 min   Charges:   PT Evaluation $PT Eval Low Complexity: 1 Procedure     PT G Codes:        [name], DPT 09/20/2015, 11:15 AM

## 2015-09-20 NOTE — Progress Notes (Signed)
Hd tx started 

## 2015-09-20 NOTE — Progress Notes (Signed)
[facility]  ROUNDING NOTE   Subjective:   Seen and examined on hemodialysis. Tolerating treatment well. UF even Using 16 ga needles. BFR 350    HEMODIALYSIS FLOWSHEET:  Blood Flow Rate (mL/min): 350 mL/min Arterial Pressure (mmHg): -130 mmHg Venous Pressure (mmHg): 230 mmHg Transmembrane Pressure (mmHg): 60 mmHg Ultrafiltration Rate (mL/min): 170 mL/min Dialysate Flow Rate (mL/min): 600 ml/min Conductivity: Machine : 13.8 Conductivity: Machine : 13.8 Dialysis Fluid Bolus: Normal Saline Bolus Amount (mL): 250 mL Intra-Hemodialysis Comments: 258ml.Marland KitchenMarland KitchenAlert,talking with Dr.Malaika Arnall  (Dr.Roshan Salamon made aware of decreased bfr.)    Objective:  Vital signs in last 24 hours:  Temp:  [97.7 F (36.5 C)-98.3 F (36.8 C)] 97.7 F (36.5 C) (09/15 0945) Pulse Rate:  [80-93] 90 (09/15 1130) Resp:  [17-26] 22 (09/15 1130) BP: (137-225)/(54-85) 191/59 (09/15 1130) SpO2:  [96 %-98 %] 98 % (09/15 1130) Weight:  [52.6 kg (116 lb)] 52.6 kg (116 lb) (09/15 0945)  Weight change:  Filed Weights   09/18/15 1325 09/20/15 0945  Weight: 52.6 kg (116 lb) 52.6 kg (116 lb)    Intake/Output: I/O last 3 completed shifts: In: 1033.1 [P.O.:120; I.V.:913.1] Out: -    Intake/Output this shift:  No intake/output data recorded.  Physical Exam: General: NAD, laying in bed  Head: Normocephalic, atraumatic. Moist oral mucosal membranes  Eyes: Anicteric, PERRL  Neck: Supple, trachea midline  Lungs:  Clear to auscultation  Heart: Regular rate and rhythm  Abdomen:  Soft, nontender,   Extremities:  no peripheral edema.  Neurologic: Nonfocal, moving all four extremities,strength 5/5  Skin: No lesions  Access: Left AVF    Basic Metabolic Panel:  Recent Labs Lab 09/18/15 1332 09/20/15 0447  NA 138 137  K 5.1 5.7*  CL 97* 104  CO2 27 22  GLUCOSE 91 92  BUN 36* 53*  CREATININE 3.66* 5.77*  CALCIUM 9.4 8.7*    Liver Function Tests:  Recent Labs Lab 09/18/15 1332  AST 40   ALT 17  ALKPHOS 127*  BILITOT 1.1  PROT 8.4*  ALBUMIN 4.5   No results for input(s): LIPASE, AMYLASE in the last 168 hours. No results for input(s): AMMONIA in the last 168 hours.  CBC:  Recent Labs Lab 09/18/15 1332 09/20/15 0447  WBC 9.5 7.5  HGB 13.7 11.6*  HCT 40.7 35.0  MCV 100.5* 100.9*  PLT 132* 103*    Cardiac Enzymes:  Recent Labs Lab 09/18/15 1337  TROPONINI 0.07*    BNP: Invalid input(s): POCBNP  CBG:  Recent Labs Lab 09/18/15 1807 09/19/15 1140 09/19/15 1711 09/19/15 2140 09/20/15 0747  GLUCAP 147* 114* 95 152* 97    Microbiology: Results for orders placed or performed during the hospital encounter of 09/18/15  Blood culture (routine x 2)     Status: None (Preliminary result)   Collection Time: 09/18/15  2:01 PM  Result Value Ref Range Status   Specimen Description BLOOD RIGHT FOREARM  Final   Special Requests BOTTLES DRAWN AEROBIC AND ANAEROBIC AER4CC ANA 4CC  Final   Culture NO GROWTH 2 DAYS  Final   Report Status PENDING  Incomplete  Blood culture (routine x 2)     Status: None (Preliminary result)   Collection Time: 09/18/15  2:02 PM  Result Value Ref Range Status   Specimen Description BLOOD RIGHT HAND  Final   Special Requests   Final    BOTTLES DRAWN AEROBIC AND ANAEROBIC AER [place] ANA 3CC   Culture NO GROWTH 2 DAYS  Final   Report Status  PENDING  Incomplete  Urine culture     Status: Abnormal   Collection Time: 09/18/15  2:12 PM  Result Value Ref Range Status   Specimen Description URINE, RANDOM  Final   Special Requests NONE  Final   Culture >=100,000 COLONIES/mL ESCHERICHIA COLI (A)  Final   Report Status 09/20/2015 FINAL  Final   Organism ID, Bacteria ESCHERICHIA COLI (A)  Final      Susceptibility   Escherichia coli - MIC*    AMPICILLIN >=32 RESISTANT Resistant     CEFAZOLIN 8 SENSITIVE Sensitive     CEFTRIAXONE <=1 SENSITIVE Sensitive     CIPROFLOXACIN >=4 RESISTANT Resistant     GENTAMICIN >=16 RESISTANT Resistant      IMIPENEM <=0.25 SENSITIVE Sensitive     NITROFURANTOIN <=16 SENSITIVE Sensitive     TRIMETH/SULFA <=20 SENSITIVE Sensitive     AMPICILLIN/SULBACTAM 16 INTERMEDIATE Intermediate     PIP/TAZO <=4 SENSITIVE Sensitive     Extended ESBL NEGATIVE Sensitive     * >=100,000 COLONIES/mL ESCHERICHIA COLI  MRSA PCR Screening     Status: None   Collection Time: 09/18/15  7:25 PM  Result Value Ref Range Status   MRSA by PCR NEGATIVE NEGATIVE Final    Comment:        The GeneXpert MRSA Assay (FDA approved for NASAL specimens only), is one component of a comprehensive MRSA colonization surveillance program. It is not intended to diagnose MRSA infection nor to guide or monitor treatment for MRSA infections.     Coagulation Studies: No results for input(s): LABPROT, INR in the last 72 hours.  Urinalysis:  Recent Labs  09/18/15 1412  COLORURINE YELLOW*  LABSPEC 1.009  PHURINE 7.0  GLUCOSEU NEGATIVE  HGBUR NEGATIVE  BILIRUBINUR NEGATIVE  KETONESUR NEGATIVE  PROTEINUR 100*  NITRITE NEGATIVE  LEUKOCYTESUR 3+*      Imaging: Ct Head Wo Contrast  Result Date: 09/18/2015 CLINICAL DATA:  Altered mental status. EXAM: CT HEAD WITHOUT CONTRAST TECHNIQUE: Contiguous axial images were obtained from the base of the skull through the vertex without intravenous contrast. COMPARISON:  CT scan of March 22, 2013. FINDINGS: Bony calvarium is intact. Visualized paranasal sinuses appear normal. Mild diffuse cortical atrophy is noted. Mild chronic ischemic white matter disease is noted. Old lacunar infarction is noted in right basal ganglia. New right posterior parietal low density is noted concerning for infarction of indeterminate age. No hemorrhage or mass lesion is noted. No mass effect or midline shift is noted. Ventricular size is within normal limits. IMPRESSION: Mild diffuse cortical atrophy. Mild chronic ischemic white matter disease. New right posterior parietal low density is noted concerning  for infarction of indeterminate age. MRI is recommended for further evaluation. Critical Value/emergent results were called by telephone at the time of interpretation on 09/18/2015 at 2:01 pm to Dr. Loura Pardon , who verbally acknowledged these results. Electronically Signed   By: Marijo Conception, M.D.   On: 09/18/2015 14:01   Mr Brain Wo Contrast  Result Date: 09/19/2015 CLINICAL DATA:  Dialysis patient with altered mental status. EXAM: MRI HEAD WITHOUT CONTRAST TECHNIQUE: Multiplanar, multiecho pulse sequences of the brain and surrounding structures were obtained without intravenous contrast. COMPARISON:  CT head 09/18/2015. FINDINGS: Brain: RIGHT posterior parieto-temporo-occipital acute infarction, posterior division RIGHT MCA territory, involving cortex and subcortical white matter, correlating with the area of cytotoxic edema noted on CT. No other areas of restricted diffusion. LEFT frontal low level increased signal on diffusion image 33 series 6 not clearly restricted  on ADC map, favored to represent T2 shine through given the hyperintensity on T2 and FLAIR imaging, although a subacute subcentimeter infarct not completely excluded. No visible acute hemorrhage, mass lesion, or extra-axial fluid. Global atrophy with hydrocephalus ex vacuo. Extensive chronic microvascular ischemic change throughout the white matter. Vascular: Flow voids are maintained in the carotids, basilar, and both vertebral arteries. Chronic hemorrhage is associated with a lacunar infarct in the RIGHT thalamus. Chronic subcortical hemorrhage is seen in the LEFT posterior temporal region. Other tiny foci of chronic hemorrhage throughout the hemispheres and posterior fossa, likely sequelae of hypertensive cerebrovascular disease. Skull and upper cervical spine: Unremarkable visualized calvarium, skullbase, and cervical vertebrae. Pituitary, pineal, cerebellar tonsils unremarkable. No upper cervical cord lesions. Sinuses/Orbits: No orbital  masses or proptosis. Globes appear symmetric. Sinuses appear well aerated, without evidence for air-fluid level. Other: No nasopharyngeal pathology or mastoid fluid. Scalp and other visualized extracranial soft tissues grossly unremarkable. IMPRESSION: Acute RIGHT posterior MCA territory infarct affecting the temporo-parieto-occipital cortex and white matter. No acute hemorrhage is associated. Global atrophy. Multiple foci of chronic hemorrhage along with extensive white matter disease, favored to represent sequelae of hypertensive cerebrovascular disease. No proximal vascular occlusion is evident. Electronically Signed   By: Staci Righter M.D.   On: 09/19/2015 11:33   US Carotid Bilateral  Result Date: 09/19/2015 CLINICAL DATA:  Altered mental status. EXAM: BILATERAL CAROTID DUPLEX ULTRASOUND TECHNIQUE: Pearline Cables scale imaging, color Doppler and duplex ultrasound were performed of bilateral carotid and vertebral arteries in the neck. COMPARISON:  None. FINDINGS: Criteria: Quantification of carotid stenosis is based on velocity parameters that correlate the residual internal carotid diameter with NASCET-based stenosis levels, using the diameter of the distal internal carotid lumen as the denominator for stenosis measurement. The following velocity measurements were obtained: RIGHT ICA:  106/19 cm/sec CCA:  [id number] cm/sec SYSTOLIC ICA/CCA RATIO:  1.1 DIASTOLIC ICA/CCA RATIO:  1.4 ECA:  100 cm/sec LEFT ICA:  127/27 cm/sec CCA:  [id number] cm/sec SYSTOLIC ICA/CCA RATIO:  0.8 DIASTOLIC ICA/CCA RATIO:  2.6 ECA:  174 cm/sec RIGHT CAROTID ARTERY: Status post right endarterectomy. Mild plaque formation is noted consistent with less than 50% diameter stenosis based on ultrasound and Doppler criteria. RIGHT VERTEBRAL ARTERY:  Antegrade flow is noted. LEFT CAROTID ARTERY: Large irregular and partially calcified plaque is noted in the midportion of the left common carotid artery. Calcified moderate plaque is noted in the proximal left  internal carotid artery. Maximum measured velocities are consistent with 50-69% stenosis based on ultrasound and Doppler criteria. However, due to the extensive calcification, portions of the lumen are not well visualized and more significant stenosis cannot be excluded. LEFT VERTEBRAL ARTERY:  Antegrade flow is noted. IMPRESSION: Status post right carotid endarterectomy. Mild plaque formation is noted in the proximal right internal carotid artery consistent with less than 50% diameter stenosis based on ultrasound and Doppler criteria. Large irregular and partially calcified plaque is noted in the midportion of the left common carotid artery. Heavily calcified plaque is noted in the proximal left internal carotid artery consistent with 50-69% stenosis based on ultrasound and Doppler criteria. However, due to the extensive calcifications, portions of the lumen are not visualized and more significant stenosis cannot be excluded. CT angiography is recommended for further evaluation. Electronically Signed   By: Marijo Conception, M.D.   On: 09/19/2015 10:47   Dg Chest Portable 1 View  Result Date: 09/18/2015 CLINICAL DATA:  82 year old female with altered mental status. Dialysis patient. Initial encounter. EXAM: PORTABLE CHEST  1 VIEW COMPARISON:  03/22/2013 and earlier. FINDINGS: Portable AP view at 1419 hours. Stable cardiomegaly and mediastinal contours. Sequelae of CABG and cardiac valve replacement. Calcified aortic atherosclerosis. New left distal subclavian/axillary region metallic vascular stent since 2015. Calcified apical lung scarring. No pneumothorax, pulmonary edema, pleural effusion or acute pulmonary opacity. IMPRESSION: No acute cardiopulmonary abnormality. Electronically Signed   By: Genevie Ann M.D.   On: 09/18/2015 14:29     Medications:     . cefTRIAXone (ROCEPHIN)  IV  1 g Intravenous Q24H  . clopidogrel  75 mg Oral Daily  . heparin subcutaneous  5,000 Units Subcutaneous Q8H  . insulin aspart   0-5 Units Subcutaneous QHS  . insulin aspart  0-9 Units Subcutaneous TID WC  . levothyroxine  75 mcg Oral QAC breakfast  . metoprolol  50 mg Oral 2 times per day on Sun Tue Thu Sat  . metoprolol tartrate  50 mg Oral Once per day on Mon Wed Fri  . pravastatin  40 mg Oral QPM   acetaminophen **OR** acetaminophen, hydrALAZINE, ondansetron **OR** ondansetron (ZOFRAN) IV, polyvinyl alcohol  Assessment/ Plan:  [name] is a 82 y.o. white female with end stage renal disease MWF, hypertension, hyperlipidemia, hypothyroidism, diabetes mellitus type II, aortic stenosis, congestive heart failure , who was admitted to [facility] on 09/18/2015  MWF [facility] [place]  1. End Stage Renal Disease: seen and examined on hemodialysis. Outpatient needle gauge is 16 with BFR of 350. Tolerating treatment well.   2. Hypertension: with acute cerebral vascular accident, allow blood pressure to ride higher than baseline.   3. Anemia of chronic kidney disease: macrocytic: hemoglobin above goal and she is getting EPO as outpatient. Hold due to ischemic event and elevated blood pressures.   4. Secondary Hyperparathyroidism: takes calcium carbonate with meals.    LOS: 2 Audrina Marten 9/15/201711:40 AM

## 2015-09-20 NOTE — NC FL2 (Signed)
[place] LEVEL OF CARE SCREENING TOOL     IDENTIFICATION  Patient Name: [name] Birthdate: [birth year] Sex: female Admission Date (Current Location): 09/18/2015  [place] and [place] Number:  Engineering geologist and Address:  [facility], [address] [place], [place] [address]      Provider Number: [id number]  Attending Physician Name and Address:  Henreitta Leber, MD  Relative Name and Phone Number:       Current Level of Care: Hospital Recommended Level of Care: [place] Prior Approval Number:    Date Approved/Denied:   PASRR Number:  [id number])  Discharge Plan: SNF    Current Diagnoses: Patient Active Problem List   Diagnosis Date Noted  . Encephalopathy 09/18/2015  . Altered mental status 09/18/2015  . Preventative health care 05/03/2014  . Peripheral vascular disease ([place]) 12/21/2013  . Toe amputation status ([place]) 08/29/2013  . History of recent fall 04/18/2013  . HTN (hypertension), malignant 03/22/2013  . S/P aortic valve replacement with bioprosthetic valve 11/15/2012  . S/P CABG x 3 11/15/2012  . Anemia of chronic kidney failure 12/22/2010  . Thrombocytopenia ([place]) 12/13/2010  . End stage renal disease on dialysis ([place]) 12/13/2010  . S/P aortic valve replacement 08/11/2010  . Arterial insufficiency-lower 07/25/2010  . Constipation, other cause 06/23/2010  . NEURODERMATITIS 04/08/2009  . Carotid arterial disease ([place]) 01/01/2009  . Coronary atherosclerosis 02/11/2007  . AORTIC STENOSIS 10/04/2006  . ANXIETY 05/30/2006  . Pulmonary hypertension ([place]) 05/30/2006  . Hypothyroidism 05/27/2006  . Type 2 diabetes mellitus with renal manifestations, controlled ([place]) 05/27/2006  . Hyperlipidemia 05/27/2006  . Essential hypertension 05/27/2006  . GERD 05/27/2006  . OSTEOARTHRITIS 05/27/2006  . OSTEOPENIA 05/27/2006  . URINARY INCONTINENCE 05/27/2006    Orientation RESPIRATION BLADDER  Height & Weight     Self, Time, Place, Situation  Normal Incontinent Weight: 116 lb (52.6 kg) Height:  5' (152.4 cm)  BEHAVIORAL SYMPTOMS/MOOD NEUROLOGICAL BOWEL NUTRITION STATUS   (None)  (None) Continent Diet (DYS 3)  AMBULATORY STATUS COMMUNICATION OF NEEDS Skin   Extensive Assist Verbally Normal                       Personal Care Assistance Level of Assistance  Bathing, Feeding, Dressing Bathing Assistance: Limited assistance Feeding assistance: Independent Dressing Assistance: Limited assistance     Functional Limitations Info  Sight, Hearing, Speech Sight Info: Impaired Hearing Info: Impaired Speech Info: Adequate    SPECIAL CARE FACTORS FREQUENCY  PT (By licensed PT), OT (By licensed OT)     PT Frequency:  (5) OT Frequency:  (5)            Contractures      Additional Factors Info  Code Status, Allergies, Insulin Sliding Scale Code Status Info:  (DNR)     Insulin Sliding Scale Info:  (insulin aspart (novoLOG) injection 0-5 Units 0-5 Units, Subcutaneous, Daily at bedtime ; insulin aspart (novoLOG) injection 0-9 Units 0-9 Units, Subcutaneous, 3 times daily with meals)       Current Medications (09/20/2015):  This is the current hospital active medication list Current Facility-Administered Medications  Medication Dose Route Frequency Provider Last Rate Last Dose  . acetaminophen (TYLENOL) tablet 650 mg  650 mg Oral Q6H PRN Fritzi Mandes, MD       Or  . acetaminophen (TYLENOL) suppository 650 mg  650 mg Rectal Q6H PRN Fritzi Mandes, MD      . calcium carbonate (TUMS -  dosed in mg elemental calcium) chewable tablet 400 mg of elemental calcium  400 mg of elemental calcium Oral TID WC Sarath Kolluru, MD      . cefTRIAXone (ROCEPHIN) 1 g in dextrose 5 % 50 mL IVPB  1 g Intravenous Q24H Fritzi Mandes, MD 100 mL/hr at 09/19/15 1739 1 g at 09/19/15 1739  . clopidogrel (PLAVIX) tablet 75 mg  75 mg Oral Daily Henreitta Leber, MD   75 mg at 09/20/15 0830  . heparin  injection 5,000 Units  5,000 Units Subcutaneous Q8H Fritzi Mandes, MD   5,000 Units at 09/20/15 0641  . hydrALAZINE (APRESOLINE) injection 10-20 mg  10-20 mg Intravenous Q6H PRN Bincy S Varughese, NP   20 mg at 09/20/15 1110  . insulin aspart (novoLOG) injection 0-5 Units  0-5 Units Subcutaneous QHS Henreitta Leber, MD      . insulin aspart (novoLOG) injection 0-9 Units  0-9 Units Subcutaneous TID WC Henreitta Leber, MD      . levothyroxine (SYNTHROID, LEVOTHROID) tablet 75 mcg  75 mcg Oral QAC breakfast Henreitta Leber, MD   75 mcg at 09/20/15 0830  . metoprolol (LOPRESSOR) tablet 50 mg  50 mg Oral 2 times per day on Sun Tue Thu Sat Henreitta Leber, MD   50 mg at 09/19/15 2146  . metoprolol (LOPRESSOR) tablet 50 mg  50 mg Oral Once per day on Mon Wed Fri Vivek J Sainani, MD      . ondansetron [facility]) tablet 4 mg  4 mg Oral Q6H PRN Fritzi Mandes, MD       Or  . ondansetron (ZOFRAN) injection 4 mg  4 mg Intravenous Q6H PRN Fritzi Mandes, MD   4 mg at 09/19/15 0643  . polyvinyl alcohol (LIQUIFILM TEARS) 1.4 % ophthalmic solution 1 drop  1 drop Both Eyes PRN Srikar Sudini, MD      . pravastatin (PRAVACHOL) tablet 40 mg  40 mg Oral QPM Henreitta Leber, MD   40 mg at 09/19/15 1739     Discharge Medications: Please see discharge summary for a list of discharge medications.  Relevant Imaging Results:  Relevant Lab Results:   Additional Information  (SSN: [id number])  Lorenso Quarry Tamani Durney, LCSW

## 2015-09-20 NOTE — Progress Notes (Signed)
CSW received consult for discharge planning. CSW will follow patient to determine discharge needs. PT & OT evaluations are pending.  Ernest Pine, MSW, LCSW, [place] Clinical Social Worker [phone]

## 2015-09-20 NOTE — Progress Notes (Signed)
Hemodialysis tx completed. 

## 2015-09-20 NOTE — Progress Notes (Signed)
Dr. Verdell Carmine notified BP 232/72 HR 98 upon arriving from dialysis. Verbal order to give extra one time dose Hydralazine 10mg . Will continue to monitor.

## 2015-09-20 NOTE — Progress Notes (Signed)
[place] [facility] [place] NAME: [name]    MR#:  [id number]  DATE OF BIRTH:  12-19-1920  SUBJECTIVE:   Patient here due to altered mental status and noted to have an acute CVA. She was also noted to have a urinary tract infection. Still a bit confused today. No other complaints.    REVIEW OF SYSTEMS:    Review of Systems  Constitutional: Negative for chills and fever.  HENT: Negative for congestion and tinnitus.   Eyes: Negative for blurred vision and double vision.  Respiratory: Negative for cough, shortness of breath and wheezing.   Cardiovascular: Negative for chest pain, orthopnea and PND.  Gastrointestinal: Negative for abdominal pain, diarrhea, nausea and vomiting.  Genitourinary: Negative for dysuria and hematuria.  Neurological: Negative for dizziness, sensory change and focal weakness.  All other systems reviewed and are negative.   Nutrition: dysphagia III Tolerating Diet: Yes Tolerating PT: Await Eval   DRUG ALLERGIES:   Allergies  Allergen Reactions  . Nitrofurantoin Itching  . Captopril Other (See Comments)    REACTION: unspecified  . Enalapril Maleate Cough  . Ramipril Other (See Comments)    REACTION: unspecified  . Sulfa Antibiotics Other (See Comments)    Reaction unknown  . Verapamil Other (See Comments)    REACTION: unspecified    VITALS:  Blood pressure (!) 232/72, pulse 98, temperature 97.9 F (36.6 C), temperature source Oral, resp. rate 16, height 5' (1.524 m), weight 52.6 kg (116 lb), SpO2 97 %.  PHYSICAL EXAMINATION:   Physical Exam  GENERAL:  82 y.o.-year-old patient lying in the bed confused a bit but in NAD.   EYES: Pupils equal, round, reactive to light and accommodation. No scleral icterus. Extraocular muscles intact.  HEENT: Head atraumatic, normocephalic. Oropharynx and nasopharynx clear.  NECK:  Supple, no jugular venous distention. No thyroid enlargement, no tenderness.  LUNGS: Normal  breath sounds bilaterally, no wheezing, rales, rhonchi. No use of accessory muscles of respiration.  CARDIOVASCULAR: S1, S2 normal. No murmurs, rubs, or gallops.  ABDOMEN: Soft, nontender, nondistended. Bowel sounds present. No organomegaly or mass.  EXTREMITIES: No cyanosis, clubbing or edema b/l.    NEUROLOGIC: Cranial nerves II through XII are intact. No focal Motor or sensory deficits b/l. Globally weak.    PSYCHIATRIC: The patient is alert and oriented x 1.  SKIN: No obvious rash, lesion, or ulcer.   Left upper ext. AV fistula with good bruit, thrill.    LABORATORY PANEL:   CBC  Recent Labs Lab 09/20/15 0447  WBC 7.5  HGB 11.6*  HCT 35.0  PLT 103*   ------------------------------------------------------------------------------------------------------------------  Chemistries   Recent Labs Lab 09/18/15 1332 09/20/15 0447  NA 138 137  K 5.1 5.7*  CL 97* 104  CO2 27 22  GLUCOSE 91 92  BUN 36* 53*  CREATININE 3.66* 5.77*  CALCIUM 9.4 8.7*  AST 40  --   ALT 17  --   ALKPHOS 127*  --   BILITOT 1.1  --    ------------------------------------------------------------------------------------------------------------------  Cardiac Enzymes  Recent Labs Lab 09/18/15 1337  TROPONINI 0.07*   ------------------------------------------------------------------------------------------------------------------  RADIOLOGY:  Mr Brain Wo Contrast  Result Date: 09/19/2015 CLINICAL DATA:  Dialysis patient with altered mental status. EXAM: MRI HEAD WITHOUT CONTRAST TECHNIQUE: Multiplanar, multiecho pulse sequences of the brain and surrounding structures were obtained without intravenous contrast. COMPARISON:  CT head 09/18/2015. FINDINGS: Brain: RIGHT posterior parieto-temporo-occipital acute infarction, posterior division RIGHT MCA territory, involving cortex and subcortical  white matter, correlating with the area of cytotoxic edema noted on CT. No other areas of restricted  diffusion. LEFT frontal low level increased signal on diffusion image 33 series 6 not clearly restricted on ADC map, favored to represent T2 shine through given the hyperintensity on T2 and FLAIR imaging, although a subacute subcentimeter infarct not completely excluded. No visible acute hemorrhage, mass lesion, or extra-axial fluid. Global atrophy with hydrocephalus ex vacuo. Extensive chronic microvascular ischemic change throughout the white matter. Vascular: Flow voids are maintained in the carotids, basilar, and both vertebral arteries. Chronic hemorrhage is associated with a lacunar infarct in the RIGHT thalamus. Chronic subcortical hemorrhage is seen in the LEFT posterior temporal region. Other tiny foci of chronic hemorrhage throughout the hemispheres and posterior fossa, likely sequelae of hypertensive cerebrovascular disease. Skull and upper cervical spine: Unremarkable visualized calvarium, skullbase, and cervical vertebrae. Pituitary, pineal, cerebellar tonsils unremarkable. No upper cervical cord lesions. Sinuses/Orbits: No orbital masses or proptosis. Globes appear symmetric. Sinuses appear well aerated, without evidence for air-fluid level. Other: No nasopharyngeal pathology or mastoid fluid. Scalp and other visualized extracranial soft tissues grossly unremarkable. IMPRESSION: Acute RIGHT posterior MCA territory infarct affecting the temporo-parieto-occipital cortex and white matter. No acute hemorrhage is associated. Global atrophy. Multiple foci of chronic hemorrhage along with extensive white matter disease, favored to represent sequelae of hypertensive cerebrovascular disease. No proximal vascular occlusion is evident. Electronically Signed   By: Staci Righter M.D.   On: 09/19/2015 11:33   US Carotid Bilateral  Result Date: 09/19/2015 CLINICAL DATA:  Altered mental status. EXAM: BILATERAL CAROTID DUPLEX ULTRASOUND TECHNIQUE: Pearline Cables scale imaging, color Doppler and duplex ultrasound were  performed of bilateral carotid and vertebral arteries in the neck. COMPARISON:  None. FINDINGS: Criteria: Quantification of carotid stenosis is based on velocity parameters that correlate the residual internal carotid diameter with NASCET-based stenosis levels, using the diameter of the distal internal carotid lumen as the denominator for stenosis measurement. The following velocity measurements were obtained: RIGHT ICA:  106/19 cm/sec CCA:  [id number] cm/sec SYSTOLIC ICA/CCA RATIO:  1.1 DIASTOLIC ICA/CCA RATIO:  1.4 ECA:  100 cm/sec LEFT ICA:  127/27 cm/sec CCA:  [id number] cm/sec SYSTOLIC ICA/CCA RATIO:  0.8 DIASTOLIC ICA/CCA RATIO:  2.6 ECA:  174 cm/sec RIGHT CAROTID ARTERY: Status post right endarterectomy. Mild plaque formation is noted consistent with less than 50% diameter stenosis based on ultrasound and Doppler criteria. RIGHT VERTEBRAL ARTERY:  Antegrade flow is noted. LEFT CAROTID ARTERY: Large irregular and partially calcified plaque is noted in the midportion of the left common carotid artery. Calcified moderate plaque is noted in the proximal left internal carotid artery. Maximum measured velocities are consistent with 50-69% stenosis based on ultrasound and Doppler criteria. However, due to the extensive calcification, portions of the lumen are not well visualized and more significant stenosis cannot be excluded. LEFT VERTEBRAL ARTERY:  Antegrade flow is noted. IMPRESSION: Status post right carotid endarterectomy. Mild plaque formation is noted in the proximal right internal carotid artery consistent with less than 50% diameter stenosis based on ultrasound and Doppler criteria. Large irregular and partially calcified plaque is noted in the midportion of the left common carotid artery. Heavily calcified plaque is noted in the proximal left internal carotid artery consistent with 50-69% stenosis based on ultrasound and Doppler criteria. However, due to the extensive calcifications, portions of the lumen are not  visualized and more significant stenosis cannot be excluded. CT angiography is recommended for further evaluation. Electronically Signed   By: Sabino Dick  Brooke Bonito, M.D.   On: 09/19/2015 10:47     ASSESSMENT AND PLAN:   82 year old female with past medical history of end-stage renal disease on hemodialysis, pulmonary hypertension, peripheral vascular disease, diabetes, CHF, hypothyroidism, GERD who presented to the hospital due to altered mental status.  1. Altered Mental status-due to acute CVA/UTI. - mental status much improved today.   - Urine culture + for E. Coli.   - cont. IV ceftriaxone and will switch to Oral upon discharge.   2. Acute CVA-patient was noted to have an acute/subacute right posterior circulation CVA. -Appreciate neurology input. Cont. plavix - appreciate PT eval and pt. Will need SNF and Social work aware.  - Seen by speech and started on a dysphagia 3 diet and tolerating it.  - cont. Pravachol.   3. Urinary tract infection-continue IV ceftriaxone - urine cultures + for E. Coli.  Pansensitive.  Switch to Oral Ceftin on discharge.  Total 5 day therapy.    4. Diabetes type 2 without compensation-continue sliding scale insulin.  5. Accelerated hypertension- BP quite elevated still.  - Continue oral metoprolol, will add clonidine. Continue as needed IV hydralazine.  D/c to rehab over the weekend.   All the records are reviewed and case discussed with Care Management/Social Worker. Management plans discussed with the patient, family and they are in agreement.  CODE STATUS: Full code  DVT Prophylaxis: Heparin SQ  TOTAL TIME TAKING CARE OF THIS PATIENT: 30 minutes.   POSSIBLE D/C IN 1-2 DAYS, DEPENDING ON CLINICAL CONDITION.   Henreitta Leber M.D on 09/20/2015 at 3:11 PM  Between 7am to 6pm - Pager - [phone]  After 6pm go to [email or web address] - [employer] [facility]  Office  [phone]  CC: Primary care physician; No  primary care provider on file.

## 2015-09-21 LAB — BASIC METABOLIC PANEL
Anion gap: 13 (ref 5–15)
BUN: 22 mg/dL — AB (ref 6–20)
CO2: 27 mmol/L (ref 22–32)
CREATININE: 3.46 mg/dL — AB (ref 0.44–1.00)
Calcium: 8.3 mg/dL — ABNORMAL LOW (ref 8.9–10.3)
Chloride: 97 mmol/L — ABNORMAL LOW (ref 101–111)
GFR, EST AFRICAN AMERICAN: 12 mL/min — AB (ref >60)
GFR, EST NON AFRICAN AMERICAN: 10 mL/min — AB (ref >60)
Glucose, Bld: 97 mg/dL (ref 65–99)
POTASSIUM: 4.2 mmol/L (ref 3.5–5.1)
SODIUM: 137 mmol/L (ref 135–145)

## 2015-09-21 LAB — BLOOD CULTURE ID PANEL (REFLEXED)
ACINETOBACTER BAUMANNII: NOT DETECTED
CANDIDA ALBICANS: NOT DETECTED
CANDIDA TROPICALIS: NOT DETECTED
Candida glabrata: NOT DETECTED
Candida krusei: NOT DETECTED
Candida parapsilosis: NOT DETECTED
ENTEROBACTERIACEAE SPECIES: NOT DETECTED
ENTEROCOCCUS SPECIES: NOT DETECTED
Enterobacter cloacae complex: NOT DETECTED
Escherichia coli: NOT DETECTED
HAEMOPHILUS INFLUENZAE: NOT DETECTED
KLEBSIELLA PNEUMONIAE: NOT DETECTED
Klebsiella oxytoca: NOT DETECTED
LISTERIA MONOCYTOGENES: NOT DETECTED
NEISSERIA MENINGITIDIS: NOT DETECTED
Proteus species: NOT DETECTED
Pseudomonas aeruginosa: NOT DETECTED
STAPHYLOCOCCUS AUREUS BCID: NOT DETECTED
STREPTOCOCCUS AGALACTIAE: NOT DETECTED
STREPTOCOCCUS SPECIES: NOT DETECTED
Serratia marcescens: NOT DETECTED
Staphylococcus species: NOT DETECTED
Streptococcus pneumoniae: NOT DETECTED
Streptococcus pyogenes: NOT DETECTED

## 2015-09-21 LAB — BLOOD GAS, ARTERIAL
ACID-BASE DEFICIT: 1.6 mmol/L (ref 0.0–2.0)
Bicarbonate: 21.3 mmol/L (ref 20.0–28.0)
FIO2: 0.21
O2 Saturation: 96.4 %
PCO2 ART: 30 mmHg — AB (ref 32.0–48.0)
PH ART: 7.46 — AB (ref 7.350–7.450)
Patient temperature: 37
pO2, Arterial: 80 mmHg — ABNORMAL LOW (ref 83.0–108.0)

## 2015-09-21 LAB — GLUCOSE, CAPILLARY
GLUCOSE-CAPILLARY: 105 mg/dL — AB (ref 65–99)
GLUCOSE-CAPILLARY: 115 mg/dL — AB (ref 65–99)
Glucose-Capillary: 121 mg/dL — ABNORMAL HIGH (ref 65–99)
Glucose-Capillary: 120 mg/dL — ABNORMAL HIGH (ref 65–99)

## 2015-09-21 LAB — HEPATITIS B SURFACE ANTIBODY, QUANTITATIVE

## 2015-09-21 LAB — HEPATITIS B SURFACE ANTIGEN: Hepatitis B Surface Ag: NEGATIVE

## 2015-09-21 MED ORDER — DEXTROSE 5 % IV SOLN
1.0000 g | Freq: Every day | INTRAVENOUS | Status: DC
  Administered 2015-09-22: 02:00:00 1 g via INTRAVENOUS
  Filled 2015-09-21: qty 10

## 2015-09-21 MED ORDER — CEPHALEXIN 500 MG PO CAPS
500.0000 mg | ORAL_CAPSULE | Freq: Two times a day (BID) | ORAL | Status: DC
  Administered 2015-09-21 (×2): 500 mg via ORAL
  Filled 2015-09-21 (×3): qty 1

## 2015-09-21 NOTE — Progress Notes (Signed)
Pharmacy Antibiotic Follow-up Note  [name] is a 82 y.o. year-old female admitted on 09/18/2015.  The patient is currently on cephalexin for E. Coli UTI.  Assessment/Plan: Lab reports GNR in 1 bottle of 1 set, BCID not an identified species. Spoke with Dr. Ara Kussmaul - switch back to ceftriaxone for now.  Temp (24hrs), Avg:98.5 F (36.9 C), Min:98.2 F (36.8 C), Max:98.7 F (37.1 C)   Recent Labs Lab 09/18/15 1332 09/20/15 0447  WBC 9.5 7.5    Recent Labs Lab 09/18/15 1332 09/20/15 0447 09/21/15 0452  CREATININE 3.66* 5.77* 3.46*   Estimated Creatinine Clearance: 7.3 mL/min (by C-G formula based on SCr of 3.46 mg/dL (H)).    Allergies  Allergen Reactions  . Nitrofurantoin Itching  . Captopril Other (See Comments)    REACTION: unspecified  . Enalapril Maleate Cough  . Ramipril Other (See Comments)    REACTION: unspecified  . Sulfa Antibiotics Other (See Comments)    Reaction unknown  . Verapamil Other (See Comments)    REACTION: unspecified    Thank you for allowing pharmacy to be a part of this patient's care.  Laural Benes, Pharm.D., BCPS Clinical Pharmacist 09/21/2015 11:22 PM

## 2015-09-21 NOTE — Clinical Social Work Note (Signed)
CSW contacted patient's son, [name], via telephone to give bed offers. [name] reported that he would discuss with his mother and give the CSW a choice this afternoon. CSW will con't to follow for dc planning.  Santiago Bumpers, MSW, LCSW-A [phone]

## 2015-09-21 NOTE — Progress Notes (Signed)
Spoke with Dr. Posey Pronto about pt BP.  BP 227/54 this am now in 140s.  Dr. Posey Pronto states to keep BP between 160-180.

## 2015-09-21 NOTE — Progress Notes (Signed)
[place] [facility] [place] NAME: [name]    MR#:  [id number]  DATE OF BIRTH:  02-Jul-1920  SUBJECTIVE:   Patient here due to altered mental status and noted to have an acute CVA. She was also noted to have a urinary tract infection. Still a bit confused today. No other complaints. Sleepy this am    REVIEW OF SYSTEMS:    Review of Systems  Constitutional: Negative for chills and fever.  HENT: Negative for congestion and tinnitus.   Eyes: Negative for blurred vision and double vision.  Respiratory: Negative for cough, shortness of breath and wheezing.   Cardiovascular: Negative for chest pain, orthopnea and PND.  Gastrointestinal: Negative for abdominal pain, diarrhea, nausea and vomiting.  Genitourinary: Negative for dysuria and hematuria.  Neurological: Negative for dizziness, sensory change and focal weakness.  All other systems reviewed and are negative.   Nutrition: dysphagia III Tolerating Diet: Yes Tolerating PT: Rehab  DRUG ALLERGIES:   Allergies  Allergen Reactions  . Nitrofurantoin Itching  . Captopril Other (See Comments)    REACTION: unspecified  . Enalapril Maleate Cough  . Ramipril Other (See Comments)    REACTION: unspecified  . Sulfa Antibiotics Other (See Comments)    Reaction unknown  . Verapamil Other (See Comments)    REACTION: unspecified    VITALS:  Blood pressure (!) 142/47, pulse 71, temperature 98.1 F (36.7 C), temperature source Oral, resp. rate 16, height 5' (1.524 m), weight 52.6 kg (116 lb), SpO2 94 %.  PHYSICAL EXAMINATION:   Physical Exam  GENERAL:  82 y.o.-year-old patient lying in the bed confused a bit but in NAD.   EYES: Pupils equal, round, reactive to light and accommodation. No scleral icterus. Extraocular muscles intact.  HEENT: Head atraumatic, normocephalic. Oropharynx and nasopharynx clear.  NECK:  Supple, no jugular venous distention. No thyroid enlargement, no tenderness.  LUNGS:  Normal breath sounds bilaterally, no wheezing, rales, rhonchi. No use of accessory muscles of respiration.  CARDIOVASCULAR: S1, S2 normal. No murmurs, rubs, or gallops.  ABDOMEN: Soft, nontender, nondistended. Bowel sounds present. No organomegaly or mass.  EXTREMITIES: No cyanosis, clubbing or edema b/l.    NEUROLOGIC: Cranial nerves II through XII are intact. No focal Motor or sensory deficits b/l. Globally weak.    PSYCHIATRIC: The patient is alert and oriented x 1.  SKIN: No obvious rash, lesion, or ulcer.   Left upper ext. AV fistula with good bruit, thrill.    LABORATORY PANEL:   CBC  Recent Labs Lab 09/20/15 0447  WBC 7.5  HGB 11.6*  HCT 35.0  PLT 103*   ------------------------------------------------------------------------------------------------------------------  Chemistries   Recent Labs Lab 09/18/15 1332  09/21/15 0452  NA 138  < > 137  K 5.1  < > 4.2  CL 97*  < > 97*  CO2 27  < > 27  GLUCOSE 91  < > 97  BUN 36*  < > 22*  CREATININE 3.66*  < > 3.46*  CALCIUM 9.4  < > 8.3*  AST 40  --   --   ALT 17  --   --   ALKPHOS 127*  --   --   BILITOT 1.1  --   --   < > = values in this interval not displayed. ------------------------------------------------------------------------------------------------------------------  Cardiac Enzymes  Recent Labs Lab 09/18/15 1337  TROPONINI 0.07*   ------------------------------------------------------------------------------------------------------------------  RADIOLOGY:  No results found.   ASSESSMENT AND PLAN:   82 year old female with  past medical history of end-stage renal disease on hemodialysis, pulmonary hypertension, peripheral vascular disease, diabetes, CHF, hypothyroidism, GERD who presented to the hospital due to altered mental status.  1. Altered Mental status-due to acute CVA/UTI. - mental status much improved today.   - Urine culture + for E. Coli.   - was on IV ceftriaxone and will switch to  Oral keflex today  2. Acute CVA-patient was noted to have an acute/subacute right posterior circulation CVA. -Appreciate neurology input. Cont. plavix - appreciate PT eval and pt. Will need SNF and Social work aware.  - Seen by speech and started on a dysphagia 3 diet and tolerating it.  - cont. Pravachol.   3. Urinary tract infection-continue po keflex - urine cultures + for E. Coli.  Pansensitive.  Switch to Oral Ceftin on discharge.  Total 5 day therapy.    4. Diabetes type 2 without compensation-continue sliding scale insulin.  5. Accelerated hypertension- BP quite elevated still.  - Continue oral metoprolol, will add clonidine. Continue as needed IV hydralazine.  D/c to rehab over the weekend.   All the records are reviewed and case discussed with Care Management/Social Worker. Management plans discussed with the patient, family and they are in agreement.  CODE STATUS: Full code  DVT Prophylaxis: Heparin SQ  TOTAL TIME TAKING CARE OF THIS PATIENT: 30 minutes.   POSSIBLE D/C IN 1-2 DAYS, DEPENDING ON CLINICAL CONDITION.   Merit Gadsby M.D on 09/21/2015 at 1:16 PM  Between 7am to 6pm - Pager - [phone]  After 6pm go to [email or web address] - [employer] [facility]  Office  [phone]  CC: Primary care physician; No primary care provider on file.

## 2015-09-21 NOTE — Progress Notes (Signed)
[facility]  ROUNDING NOTE   Subjective:   Hemodialysis yesterday. Tolerated treatment well. UF of even   Objective:  Vital signs in last 24 hours:  Temp:  [97.9 F (36.6 C)-98.1 F (36.7 C)] 98.1 F (36.7 C) (09/15 2059) Pulse Rate:  [71-98] 71 (09/16 0908) Resp:  [16-22] 16 (09/15 2059) BP: (142-232)/(42-72) 142/47 (09/16 1020) SpO2:  [94 %-98 %] 94 % (09/15 2059)  Weight change:  Filed Weights   09/18/15 1325 09/20/15 0945  Weight: 52.6 kg (116 lb) 52.6 kg (116 lb)    Intake/Output: No intake/output data recorded.   Intake/Output this shift:  No intake/output data recorded.  Physical Exam: General: NAD, laying in bed  Head: Normocephalic, atraumatic. Moist oral mucosal membranes  Eyes: Anicteric, PERRL  Neck: Supple, trachea midline  Lungs:  Clear to auscultation  Heart: Regular rate and rhythm  Abdomen:  Soft, nontender,   Extremities:  no peripheral edema.  Neurologic: Nonfocal, moving all four extremities,strength 5/5  Skin: No lesions  Access: Left AVF    Basic Metabolic Panel:  Recent Labs Lab 09/18/15 1332 09/20/15 0447 09/21/15 0452  NA 138 137 137  K 5.1 5.7* 4.2  CL 97* 104 97*  CO2 27 22 27   GLUCOSE 91 92 97  BUN 36* 53* 22*  CREATININE 3.66* 5.77* 3.46*  CALCIUM 9.4 8.7* 8.3*    Liver Function Tests:  Recent Labs Lab 09/18/15 1332  AST 40  ALT 17  ALKPHOS 127*  BILITOT 1.1  PROT 8.4*  ALBUMIN 4.5   No results for input(s): LIPASE, AMYLASE in the last 168 hours. No results for input(s): AMMONIA in the last 168 hours.  CBC:  Recent Labs Lab 09/18/15 1332 09/20/15 0447  WBC 9.5 7.5  HGB 13.7 11.6*  HCT 40.7 35.0  MCV 100.5* 100.9*  PLT 132* 103*    Cardiac Enzymes:  Recent Labs Lab 09/18/15 1337  TROPONINI 0.07*    BNP: Invalid input(s): POCBNP  CBG:  Recent Labs Lab 09/20/15 1415 09/20/15 1643 09/20/15 2135 09/21/15 0759 09/21/15 1113  GLUCAP 82 212* 163* 105* 115*     Microbiology: Results for orders placed or performed during the hospital encounter of 09/18/15  Blood culture (routine x 2)     Status: None (Preliminary result)   Collection Time: 09/18/15  2:01 PM  Result Value Ref Range Status   Specimen Description BLOOD RIGHT FOREARM  Final   Special Requests BOTTLES DRAWN AEROBIC AND ANAEROBIC AER4CC ANA 4CC  Final   Culture NO GROWTH 3 DAYS  Final   Report Status PENDING  Incomplete  Blood culture (routine x 2)     Status: None (Preliminary result)   Collection Time: 09/18/15  2:02 PM  Result Value Ref Range Status   Specimen Description BLOOD RIGHT HAND  Final   Special Requests   Final    BOTTLES DRAWN AEROBIC AND ANAEROBIC AER [place] ANA 3CC   Culture NO GROWTH 3 DAYS  Final   Report Status PENDING  Incomplete  Urine culture     Status: Abnormal   Collection Time: 09/18/15  2:12 PM  Result Value Ref Range Status   Specimen Description URINE, RANDOM  Final   Special Requests NONE  Final   Culture >=100,000 COLONIES/mL ESCHERICHIA COLI (A)  Final   Report Status 09/20/2015 FINAL  Final   Organism ID, Bacteria ESCHERICHIA COLI (A)  Final      Susceptibility   Escherichia coli - MIC*    AMPICILLIN >=32 RESISTANT  Resistant     CEFAZOLIN 8 SENSITIVE Sensitive     CEFTRIAXONE <=1 SENSITIVE Sensitive     CIPROFLOXACIN >=4 RESISTANT Resistant     GENTAMICIN >=16 RESISTANT Resistant     IMIPENEM <=0.25 SENSITIVE Sensitive     NITROFURANTOIN <=16 SENSITIVE Sensitive     TRIMETH/SULFA <=20 SENSITIVE Sensitive     AMPICILLIN/SULBACTAM 16 INTERMEDIATE Intermediate     PIP/TAZO <=4 SENSITIVE Sensitive     Extended ESBL NEGATIVE Sensitive     * >=100,000 COLONIES/mL ESCHERICHIA COLI  MRSA PCR Screening     Status: None   Collection Time: 09/18/15  7:25 PM  Result Value Ref Range Status   MRSA by PCR NEGATIVE NEGATIVE Final    Comment:        The GeneXpert MRSA Assay (FDA approved for NASAL specimens only), is one component of  a comprehensive MRSA colonization surveillance program. It is not intended to diagnose MRSA infection nor to guide or monitor treatment for MRSA infections.     Coagulation Studies: No results for input(s): LABPROT, INR in the last 72 hours.  Urinalysis:  Recent Labs  09/18/15 1412  COLORURINE YELLOW*  LABSPEC 1.009  PHURINE 7.0  GLUCOSEU NEGATIVE  HGBUR NEGATIVE  BILIRUBINUR NEGATIVE  KETONESUR NEGATIVE  PROTEINUR 100*  NITRITE NEGATIVE  LEUKOCYTESUR 3+*      Imaging: No results found.   Medications:     . calcium carbonate  400 mg of elemental calcium Oral TID WC  . cephALEXin  500 mg Oral Q12H  . cloNIDine  0.1 mg Oral BID  . clopidogrel  75 mg Oral Daily  . heparin subcutaneous  5,000 Units Subcutaneous Q8H  . insulin aspart  0-5 Units Subcutaneous QHS  . insulin aspart  0-9 Units Subcutaneous TID WC  . levothyroxine  75 mcg Oral QAC breakfast  . metoprolol  50 mg Oral 2 times per day on Sun Tue Thu Sat  . metoprolol tartrate  50 mg Oral Once per day on Mon Wed Fri  . pravastatin  40 mg Oral QPM   acetaminophen **OR** acetaminophen, hydrALAZINE, ondansetron **OR** ondansetron (ZOFRAN) IV, polyvinyl alcohol  Assessment/ Plan:  [name] is a 82 y.o. white female with end stage renal disease MWF, hypertension, hyperlipidemia, hypothyroidism, diabetes mellitus type II, aortic stenosis, congestive heart failure , who was admitted to [facility] on 09/18/2015  M[facility] [place]  1. End Stage Renal Disease: Outpatient needle gauge is 16 with BFR of 350.  Next treatment for Monday.   2. Hypertension: with acute cerebral vascular accident, allow blood pressure to ride higher than baseline.   3. Anemia of chronic kidney disease: macrocytic: hemoglobin above goal and she is getting mircera as outpatient. Hold due to ischemic event and elevated blood pressures.   4. Secondary Hyperparathyroidism:  - calcium carbonate with meals.    LOS:  3 Dollie Bressi 9/16/201712:57 PM

## 2015-09-21 NOTE — Clinical Social Work Note (Signed)
Patient's son reported to the CSW that they would be choosing [facility] for SNF. CSW updated in [place]. CSW will con't to follow for dc plan for 09/22/2015.  Santiago Bumpers, MSW, LCSW-A [phone]

## 2015-09-22 DIAGNOSIS — N2581 Secondary hyperparathyroidism of renal origin: Secondary | ICD-10-CM | POA: Diagnosis not present

## 2015-09-22 DIAGNOSIS — D631 Anemia in chronic kidney disease: Secondary | ICD-10-CM | POA: Diagnosis not present

## 2015-09-22 DIAGNOSIS — N390 Urinary tract infection, site not specified: Secondary | ICD-10-CM | POA: Diagnosis not present

## 2015-09-22 DIAGNOSIS — L570 Actinic keratosis: Secondary | ICD-10-CM | POA: Diagnosis not present

## 2015-09-22 DIAGNOSIS — N186 End stage renal disease: Secondary | ICD-10-CM | POA: Diagnosis not present

## 2015-09-22 DIAGNOSIS — Z85828 Personal history of other malignant neoplasm of skin: Secondary | ICD-10-CM | POA: Diagnosis not present

## 2015-09-22 DIAGNOSIS — I739 Peripheral vascular disease, unspecified: Secondary | ICD-10-CM | POA: Diagnosis not present

## 2015-09-22 DIAGNOSIS — Z23 Encounter for immunization: Secondary | ICD-10-CM | POA: Diagnosis not present

## 2015-09-22 DIAGNOSIS — D509 Iron deficiency anemia, unspecified: Secondary | ICD-10-CM | POA: Diagnosis not present

## 2015-09-22 DIAGNOSIS — M6281 Muscle weakness (generalized): Secondary | ICD-10-CM | POA: Diagnosis not present

## 2015-09-22 DIAGNOSIS — I63531 Cerebral infarction due to unspecified occlusion or stenosis of right posterior cerebral artery: Secondary | ICD-10-CM | POA: Diagnosis not present

## 2015-09-22 DIAGNOSIS — I120 Hypertensive chronic kidney disease with stage 5 chronic kidney disease or end stage renal disease: Secondary | ICD-10-CM | POA: Diagnosis not present

## 2015-09-22 DIAGNOSIS — E119 Type 2 diabetes mellitus without complications: Secondary | ICD-10-CM | POA: Diagnosis not present

## 2015-09-22 DIAGNOSIS — I639 Cerebral infarction, unspecified: Secondary | ICD-10-CM | POA: Diagnosis not present

## 2015-09-22 DIAGNOSIS — G94 Other disorders of brain in diseases classified elsewhere: Secondary | ICD-10-CM | POA: Diagnosis not present

## 2015-09-22 DIAGNOSIS — I10 Essential (primary) hypertension: Secondary | ICD-10-CM | POA: Diagnosis not present

## 2015-09-22 DIAGNOSIS — X32XXXA Exposure to sunlight, initial encounter: Secondary | ICD-10-CM | POA: Diagnosis not present

## 2015-09-22 DIAGNOSIS — Z992 Dependence on renal dialysis: Secondary | ICD-10-CM | POA: Diagnosis not present

## 2015-09-22 LAB — GLUCOSE, CAPILLARY
GLUCOSE-CAPILLARY: 138 mg/dL — AB (ref 65–99)
Glucose-Capillary: 89 mg/dL (ref 65–99)

## 2015-09-22 MED ORDER — CEPHALEXIN 500 MG PO CAPS: 500.0000 mg | ORAL_CAPSULE | Freq: Two times a day (BID) | ORAL | 0 refills | Status: DC

## 2015-09-22 MED ORDER — HYDRALAZINE HCL 25 MG PO TABS: 25.0000 mg | ORAL_TABLET | Freq: Three times a day (TID) | ORAL | Status: DC

## 2015-09-22 MED ORDER — POLYVINYL ALCOHOL 1.4 % OP SOLN: 1.0000 [drp] | OPHTHALMIC | 0 refills | Status: AC | PRN

## 2015-09-22 MED ORDER — NYSTATIN 100000 UNIT/GM EX POWD
Freq: Two times a day (BID) | CUTANEOUS | Status: DC
  Filled 2015-09-22: qty 15

## 2015-09-22 MED ORDER — HYDRALAZINE HCL 25 MG PO TABS: 25.0000 mg | ORAL_TABLET | Freq: Three times a day (TID) | ORAL | 1 refills | Status: DC

## 2015-09-22 MED ORDER — CEPHALEXIN 500 MG PO CAPS
500.0000 mg | ORAL_CAPSULE | Freq: Two times a day (BID) | ORAL | Status: DC
  Administered 2015-09-22: 12:00:00 500 mg via ORAL

## 2015-09-22 MED ORDER — CLONIDINE HCL 0.1 MG PO TABS: 0.1000 mg | ORAL_TABLET | Freq: Two times a day (BID) | ORAL | 11 refills | Status: DC

## 2015-09-22 MED ORDER — NYSTATIN 100000 UNIT/GM EX POWD: Freq: Two times a day (BID) | CUTANEOUS | 0 refills | Status: DC

## 2015-09-22 MED ORDER — CLOPIDOGREL BISULFATE 75 MG PO TABS: 75.0000 mg | ORAL_TABLET | Freq: Every day | ORAL | 2 refills | Status: DC

## 2015-09-22 NOTE — Discharge Summary (Signed)
[facility] NAME: [name]    MR#:  [id number]  DATE OF BIRTH:  [birth year]  DATE OF ADMISSION:  09/18/2015 ADMITTING PHYSICIAN: Fritzi Mandes, MD  DATE OF DISCHARGE: 09/22/2015 PRIMARY CARE PHYSICIAN: No primary care provider on file.    ADMISSION DIAGNOSIS:  Altered mental state [R41.82] UTI (lower urinary tract infection) [N39.0] Essential hypertension [I10] Cerebral infarction due to unspecified mechanism [I63.9] Sepsis, due to unspecified organism ([place]) [A41.9] Altered mental status, unspecified altered mental status type [R41.82]  DISCHARGE DIAGNOSIS:  Acute encepalopathy due to acute CVA Uncontrolled HTN UTI ESRD on HD SECONDARY DIAGNOSIS:   Past Medical History:  Diagnosis Date  . Anemia    NOS  . Anxiety   . Aortic stenosis   . Cancer [facility])    Mole on top of head to be removed in July 2013  . CHF (congestive heart failure) ([place])   . Constipation   . Coronary artery disease   . Diabetes mellitus    type II;was on Glipizide but has been off x 48mon  . Dialysis patient ([place])   . Diarrhea   . GERD (gastroesophageal reflux disease)   . History of blood transfusion   . Hyperlipidemia    takes Simvastatin nightly  . Hyperlipidemia   . Hypertension    takes Metoprolol daily  . Hypothyroidism    takes Synthroid daily  . Migraine    hx of  . Oligouria   . Osteoarthritis   . Osteopenia   . Peripheral vascular disease ([place])   . Personal history of colonic polyps   . Pulmonary hypertension ([place])   . Renal insufficiency   . Urinary incontinence   . UTI (urinary tract infection)     HOSPITAL COURSE:   82 year old female with past medical history of end-stage renal disease on hemodialysis, pulmonary hypertension, peripheral vascular disease, diabetes, CHF, hypothyroidism, GERD who presented to the hospital due to altered mental status.  1. Altered Mental status-due to acute CVA/UTI. - mental status  much improved today.   - Urine culture + for E. Coli.   - was on IV ceftriaxone and will switch to Oral keflex today at d/c  2. Acute CVA-patient was noted to have an acute/subacute right posterior circulation CVA. -Appreciate neurology input. - Cont. plavix - appreciate PT eval and pt. Will need SNF and Social work aware.  - Seen by speech and started on a dysphagia 3 diet and tolerating it.  - cont. Pravachol.   3. Urinary tract infection-continue po keflex - urine cultures + for E. Coli.  Pansensitive.   -Switch to Oral keflex  4. Diabetes type 2 without compensation-continue sliding scale insulin.  5. Accelerated hypertension- BP quite elevated still.  - Continue oral metoprolol -added clonidine and hydralalzine  Spoke with dr Juleen China D/c to WOM with pt's son   CONSULTS OBTAINED:  Treatment Team:  Catarina Hartshorn, MD Leotis Pain, MD  DRUG ALLERGIES:   Allergies  Allergen Reactions  . Nitrofurantoin Itching  . Captopril Other (See Comments)    REACTION: unspecified  . Enalapril Maleate Cough  . Ramipril Other (See Comments)    REACTION: unspecified  . Sulfa Antibiotics Other (See Comments)    Reaction unknown  . Verapamil Other (See Comments)    REACTION: unspecified    DISCHARGE MEDICATIONS:   Current Discharge Medication List    START taking these medications   Details  cephALEXin (KEFLEX) 500 MG capsule Take 1 capsule (  500 mg total) by mouth every 12 (twelve) hours. Qty: 6 capsule, Refills: 0    cloNIDine (CATAPRES) 0.1 MG tablet Take 1 tablet (0.1 mg total) by mouth 2 (two) times daily. Qty: 60 tablet, Refills: 11    clopidogrel (PLAVIX) 75 MG tablet Take 1 tablet (75 mg total) by mouth daily. Qty: 30 tablet, Refills: 2    hydrALAZINE (APRESOLINE) 25 MG tablet Take 1 tablet (25 mg total) by mouth every 8 (eight) hours. Qty: 90 tablet, Refills: 1    nystatin (MYCOSTATIN/NYSTOP) powder Apply topically 2 (two) times daily. Qty: 15 g,  Refills: 0    polyvinyl alcohol (LIQUIFILM TEARS) 1.4 % ophthalmic solution Place 1 drop into both eyes as needed for dry eyes. Qty: 15 mL, Refills: 0      CONTINUE these medications which have NOT CHANGED   Details  aspirin EC 81 MG tablet Take 81 mg by mouth at bedtime.    calcium carbonate (TUMS - DOSED IN MG ELEMENTAL CALCIUM) 500 MG chewable tablet Chew 1 tablet (200 mg of elemental calcium total) by mouth 3 (three) times daily with meals.    folic acid (FOLVITE) 0.5 MG tablet Take 0.5 tablets (0.5 mg total) by mouth daily.    levothyroxine (SYNTHROID, LEVOTHROID) 75 MCG tablet TAKE ONE TABLET BY MOUTH ONCE DAILY Qty: 30 tablet, Refills: 11    metoprolol (LOPRESSOR) 50 MG tablet Take 1 tablet (50 mg total) by mouth 2 (two) times daily. Qty: 180 tablet, Refills: 3    pravastatin (PRAVACHOL) 40 MG tablet Take 1 tablet (40 mg total) by mouth every evening. Qty: 90 tablet, Refills: 4    vitamin C (VITAMIN C) 500 MG tablet Take 1 tablet (500 mg total) by mouth daily.        If you experience worsening of your admission symptoms, develop shortness of breath, life threatening emergency, suicidal or homicidal thoughts you must seek medical attention immediately by calling 911 or calling your MD immediately  if symptoms less severe.  You Must read complete instructions/literature along with all the possible adverse reactions/side effects for all the Medicines you take and that have been prescribed to you. Take any new Medicines after you have completely understood and accept all the possible adverse reactions/side effects.   Please note  You were cared for by a hospitalist during your hospital stay. If you have any questions about your discharge medications or the care you received while you were in the hospital after you are discharged, you can call the unit and asked to speak with the hospitalist on call if the hospitalist that took care of you is not available. Once you are  discharged, your primary care physician will handle any further medical issues. Please note that NO REFILLS for any discharge medications will be authorized once you are discharged, as it is imperative that you return to your primary care physician (or establish a relationship with a primary care physician if you do not have one) for your aftercare needs so that they can reassess your need for medications and monitor your lab values. Today   SUBJECTIVE   doing well  VITAL SIGNS:  Blood pressure (!) 210/54, pulse 66, temperature 98.1 F (36.7 C), temperature source Oral, resp. rate 20, height 5' (1.524 m), weight 52.6 kg (116 lb), SpO2 94 %. Repeat check was 155/54 in the right leg I/O:    Intake/Output Summary (Last 24 hours) at 09/22/15 1033 Last data filed at 09/22/15 0926  Gross per 24 hour  Intake              530 ml  Output                0 ml  Net              530 ml    PHYSICAL EXAMINATION:  GENERAL:  82 y.o.-year-old patient lying in the bed with no acute distress.  EYES: Pupils equal, round, reactive to light and accommodation. No scleral icterus. Extraocular muscles intact.  HEENT: Head atraumatic, normocephalic. Oropharynx and nasopharynx clear.  NECK:  Supple, no jugular venous distention. No thyroid enlargement, no tenderness.  LUNGS: Normal breath sounds bilaterally, no wheezing, rales,rhonchi or crepitation. No use of accessory muscles of respiration.  CARDIOVASCULAR: S1, S2 normal. No murmurs, rubs, or gallops.  ABDOMEN: Soft, non-tender, non-distended. Bowel sounds present. No organomegaly or mass.  EXTREMITIES: No pedal edema, cyanosis, or clubbing.  NEUROLOGIC: Cranial nerves II through XII are intact. Muscle strength 5/5 in all extremities. Sensation intact. Gait not checked.  PSYCHIATRIC: The patient is alert and oriented x 3.  SKIN: No obvious rash, lesion, or ulcer.   DATA REVIEW:   CBC   Recent Labs Lab 09/20/15 0447  WBC 7.5  HGB 11.6*  HCT 35.0   PLT 103*    Chemistries   Recent Labs Lab 09/18/15 1332  09/21/15 0452  NA 138  < > 137  K 5.1  < > 4.2  CL 97*  < > 97*  CO2 27  < > 27  GLUCOSE 91  < > 97  BUN 36*  < > 22*  CREATININE 3.66*  < > 3.46*  CALCIUM 9.4  < > 8.3*  AST 40  --   --   ALT 17  --   --   ALKPHOS 127*  --   --   BILITOT 1.1  --   --   < > = values in this interval not displayed.  Microbiology Results   Recent Results (from the past 240 hour(s))  Blood culture (routine x 2)     Status: None (Preliminary result)   Collection Time: 09/18/15  2:01 PM  Result Value Ref Range Status   Specimen Description BLOOD RIGHT FOREARM  Final   Special Requests BOTTLES DRAWN AEROBIC AND ANAEROBIC AER4CC ANA 4CC  Final   Culture NO GROWTH 4 DAYS  Final   Report Status PENDING  Incomplete  Blood culture (routine x 2)     Status: None (Preliminary result)   Collection Time: 09/18/15  2:02 PM  Result Value Ref Range Status   Specimen Description BLOOD RIGHT HAND  Final   Special Requests   Final    BOTTLES DRAWN AEROBIC AND ANAEROBIC AER [place] ANA 3CC   Culture  Setup Time   Final    Organism ID to follow GRAM NEGATIVE RODS AEROBIC BOTTLE ONLY CRITICAL RESULT CALLED TO, READ BACK BY AND VERIFIED WITH: NATE COOKSON AT 2230 09/21/15.PMH CONFIRMED BY RWW    Culture NO GROWTH 4 DAYS  Final   Report Status PENDING  Incomplete  Blood Culture ID Panel (Reflexed)     Status: None   Collection Time: 09/18/15  2:02 PM  Result Value Ref Range Status   Enterococcus species NOT DETECTED NOT DETECTED Final   Listeria monocytogenes NOT DETECTED NOT DETECTED Final   Staphylococcus species NOT DETECTED NOT DETECTED Final   Staphylococcus aureus NOT DETECTED NOT DETECTED Final   Streptococcus species NOT DETECTED NOT DETECTED  Final   Streptococcus agalactiae NOT DETECTED NOT DETECTED Final   Streptococcus pneumoniae NOT DETECTED NOT DETECTED Final   Streptococcus pyogenes NOT DETECTED NOT DETECTED Final   Acinetobacter  baumannii NOT DETECTED NOT DETECTED Final   Enterobacteriaceae species NOT DETECTED NOT DETECTED Final   Enterobacter cloacae complex NOT DETECTED NOT DETECTED Final   Escherichia coli NOT DETECTED NOT DETECTED Final   Klebsiella oxytoca NOT DETECTED NOT DETECTED Final   Klebsiella pneumoniae NOT DETECTED NOT DETECTED Final   Proteus species NOT DETECTED NOT DETECTED Final   Serratia marcescens NOT DETECTED NOT DETECTED Final   Haemophilus influenzae NOT DETECTED NOT DETECTED Final   Neisseria meningitidis NOT DETECTED NOT DETECTED Final   Pseudomonas aeruginosa NOT DETECTED NOT DETECTED Final   Candida albicans NOT DETECTED NOT DETECTED Final   Candida glabrata NOT DETECTED NOT DETECTED Final   Candida krusei NOT DETECTED NOT DETECTED Final   Candida parapsilosis NOT DETECTED NOT DETECTED Final   Candida tropicalis NOT DETECTED NOT DETECTED Final  Urine culture     Status: Abnormal   Collection Time: 09/18/15  2:12 PM  Result Value Ref Range Status   Specimen Description URINE, RANDOM  Final   Special Requests NONE  Final   Culture >=100,000 COLONIES/mL ESCHERICHIA COLI (A)  Final   Report Status 09/20/2015 FINAL  Final   Organism ID, Bacteria ESCHERICHIA COLI (A)  Final      Susceptibility   Escherichia coli - MIC*    AMPICILLIN >=32 RESISTANT Resistant     CEFAZOLIN 8 SENSITIVE Sensitive     CEFTRIAXONE <=1 SENSITIVE Sensitive     CIPROFLOXACIN >=4 RESISTANT Resistant     GENTAMICIN >=16 RESISTANT Resistant     IMIPENEM <=0.25 SENSITIVE Sensitive     NITROFURANTOIN <=16 SENSITIVE Sensitive     TRIMETH/SULFA <=20 SENSITIVE Sensitive     AMPICILLIN/SULBACTAM 16 INTERMEDIATE Intermediate     PIP/TAZO <=4 SENSITIVE Sensitive     Extended ESBL NEGATIVE Sensitive     * >=100,000 COLONIES/mL ESCHERICHIA COLI  MRSA PCR Screening     Status: None   Collection Time: 09/18/15  7:25 PM  Result Value Ref Range Status   MRSA by PCR NEGATIVE NEGATIVE Final    Comment:        The  GeneXpert MRSA Assay (FDA approved for NASAL specimens only), is one component of a comprehensive MRSA colonization surveillance program. It is not intended to diagnose MRSA infection nor to guide or monitor treatment for MRSA infections.     RADIOLOGY:  No results found.   Management plans discussed with the patient, family and they are in agreement.  CODE STATUS:     Code Status Orders        Start     Ordered   09/18/15 1818  Do not attempt resuscitation (DNR)  Continuous    Question Answer Comment  In the event of cardiac or respiratory ARREST Do not call a "code blue"   In the event of cardiac or respiratory ARREST Do not perform Intubation, CPR, defibrillation or ACLS   In the event of cardiac or respiratory ARREST Use medication by any route, position, wound care, and other measures to relive pain and suffering. May use oxygen, suction and manual treatment of airway obstruction as needed for comfort.      09/18/15 1817    Code Status History    Date Active Date Inactive Code Status Order ID Comments User Context   09/18/2015  3:16 PM 09/18/2015  6:17 PM DNR [id number]  Flora Lipps, MD ED   03/22/2013  9:00 PM 03/26/2013  3:40 PM Full Code [id number]  Rise Patience, MD Inpatient   11/20/2012  5:56 PM 12/06/2012  8:08 PM DNR [id number]  Annita Brod, MD Inpatient   06/18/2011 10:42 AM 06/19/2011  2:01 PM Full Code [id number]  Paulene Floor, RN Inpatient   12/14/2010 11:24 AM 12/29/2010  3:43 PM DNR [id number]  Bonnielee Haff, MD Inpatient   12/13/2010  1:13 AM 12/14/2010 11:23 AM Full Code [id number]  Sid Falcon, RN Inpatient   11/22/2010  2:16 AM 11/28/2010 10:39 PM DNR [id number]  Viviano Simas, RN Inpatient      TOTAL TIME TAKING CARE OF THIS PATIENT: 40 minutes.    Krislyn Donnan M.D on 09/22/2015 at 10:33 AM  Between 7am to 6pm - Pager - [phone] After 6pm go to [email or web address] - password [email or web address] [facility]  [place] [facility]  Office   [phone]  CC: Primary care physician; No primary care provider on file.

## 2015-09-22 NOTE — Discharge Instructions (Signed)
Pt to be on RENAL/Carbohydrate controlled diet with fluid restriction

## 2015-09-22 NOTE — Progress Notes (Signed)
[facility]  ROUNDING NOTE   Subjective:   Son at bedside. Patient to be discharged to [facility] for rehab.   Objective:  Vital signs in last 24 hours:  Temp:  [98.1 F (36.7 C)-98.7 F (37.1 C)] 98.1 F (36.7 C) (09/17 0820) Pulse Rate:  [66-72] 66 (09/17 0959) Resp:  [17-20] 20 (09/17 0820) BP: (162-210)/(46-54) 210/54 (09/17 0959) SpO2:  [93 %-95 %] 94 % (09/17 0820)  Weight change:  Filed Weights   09/18/15 1325 09/20/15 0945  Weight: 52.6 kg (116 lb) 52.6 kg (116 lb)    Intake/Output: I/O last 3 completed shifts: In: 290 [P.O.:240; IV Piggyback:50] Out: -    Intake/Output this shift:  Total I/O In: 240 [P.O.:240] Out: -   Physical Exam: General: NAD, laying in bed  Head: Normocephalic, atraumatic. Moist oral mucosal membranes  Eyes: Anicteric, PERRL  Neck: Supple, trachea midline  Lungs:  Clear to auscultation  Heart: Regular rate and rhythm  Abdomen:  Soft, nontender,   Extremities:  no peripheral edema.  Neurologic: Nonfocal, moving all four extremities,strength 5/5  Skin: No lesions  Access: Left AVF    Basic Metabolic Panel:  Recent Labs Lab 09/18/15 1332 09/20/15 0447 09/21/15 0452  NA 138 137 137  K 5.1 5.7* 4.2  CL 97* 104 97*  CO2 27 22 27   GLUCOSE 91 92 97  BUN 36* 53* 22*  CREATININE 3.66* 5.77* 3.46*  CALCIUM 9.4 8.7* 8.3*    Liver Function Tests:  Recent Labs Lab 09/18/15 1332  AST 40  ALT 17  ALKPHOS 127*  BILITOT 1.1  PROT 8.4*  ALBUMIN 4.5   No results for input(s): LIPASE, AMYLASE in the last 168 hours. No results for input(s): AMMONIA in the last 168 hours.  CBC:  Recent Labs Lab 09/18/15 1332 09/20/15 0447  WBC 9.5 7.5  HGB 13.7 11.6*  HCT 40.7 35.0  MCV 100.5* 100.9*  PLT 132* 103*    Cardiac Enzymes:  Recent Labs Lab 09/18/15 1337  TROPONINI 0.07*    BNP: Invalid input(s): POCBNP  CBG:  Recent Labs Lab 09/21/15 0759 09/21/15 1113 09/21/15 1650 09/21/15 2137  09/22/15 0747  GLUCAP 105* 115* 121* 120* 47    Microbiology: Results for orders placed or performed during the hospital encounter of 09/18/15  Blood culture (routine x 2)     Status: None (Preliminary result)   Collection Time: 09/18/15  2:01 PM  Result Value Ref Range Status   Specimen Description BLOOD RIGHT FOREARM  Final   Special Requests BOTTLES DRAWN AEROBIC AND ANAEROBIC AER4CC ANA 4CC  Final   Culture NO GROWTH 4 DAYS  Final   Report Status PENDING  Incomplete  Blood culture (routine x 2)     Status: None (Preliminary result)   Collection Time: 09/18/15  2:02 PM  Result Value Ref Range Status   Specimen Description BLOOD RIGHT HAND  Final   Special Requests   Final    BOTTLES DRAWN AEROBIC AND ANAEROBIC AER [place] ANA 3CC   Culture  Setup Time   Final    Organism ID to follow GRAM NEGATIVE RODS AEROBIC BOTTLE ONLY CRITICAL RESULT CALLED TO, READ BACK BY AND VERIFIED WITH: NATE COOKSON AT 2230 09/21/15.PMH CONFIRMED BY RWW    Culture NO GROWTH 4 DAYS  Final   Report Status PENDING  Incomplete  Blood Culture ID Panel (Reflexed)     Status: None   Collection Time: 09/18/15  2:02 PM  Result Value Ref Range Status  Enterococcus species NOT DETECTED NOT DETECTED Final   Listeria monocytogenes NOT DETECTED NOT DETECTED Final   Staphylococcus species NOT DETECTED NOT DETECTED Final   Staphylococcus aureus NOT DETECTED NOT DETECTED Final   Streptococcus species NOT DETECTED NOT DETECTED Final   Streptococcus agalactiae NOT DETECTED NOT DETECTED Final   Streptococcus pneumoniae NOT DETECTED NOT DETECTED Final   Streptococcus pyogenes NOT DETECTED NOT DETECTED Final   Acinetobacter baumannii NOT DETECTED NOT DETECTED Final   Enterobacteriaceae species NOT DETECTED NOT DETECTED Final   Enterobacter cloacae complex NOT DETECTED NOT DETECTED Final   Escherichia coli NOT DETECTED NOT DETECTED Final   Klebsiella oxytoca NOT DETECTED NOT DETECTED Final   Klebsiella pneumoniae NOT  DETECTED NOT DETECTED Final   Proteus species NOT DETECTED NOT DETECTED Final   Serratia marcescens NOT DETECTED NOT DETECTED Final   Haemophilus influenzae NOT DETECTED NOT DETECTED Final   Neisseria meningitidis NOT DETECTED NOT DETECTED Final   Pseudomonas aeruginosa NOT DETECTED NOT DETECTED Final   Candida albicans NOT DETECTED NOT DETECTED Final   Candida glabrata NOT DETECTED NOT DETECTED Final   Candida krusei NOT DETECTED NOT DETECTED Final   Candida parapsilosis NOT DETECTED NOT DETECTED Final   Candida tropicalis NOT DETECTED NOT DETECTED Final  Urine culture     Status: Abnormal   Collection Time: 09/18/15  2:12 PM  Result Value Ref Range Status   Specimen Description URINE, RANDOM  Final   Special Requests NONE  Final   Culture >=100,000 COLONIES/mL ESCHERICHIA COLI (A)  Final   Report Status 09/20/2015 FINAL  Final   Organism ID, Bacteria ESCHERICHIA COLI (A)  Final      Susceptibility   Escherichia coli - MIC*    AMPICILLIN >=32 RESISTANT Resistant     CEFAZOLIN 8 SENSITIVE Sensitive     CEFTRIAXONE <=1 SENSITIVE Sensitive     CIPROFLOXACIN >=4 RESISTANT Resistant     GENTAMICIN >=16 RESISTANT Resistant     IMIPENEM <=0.25 SENSITIVE Sensitive     NITROFURANTOIN <=16 SENSITIVE Sensitive     TRIMETH/SULFA <=20 SENSITIVE Sensitive     AMPICILLIN/SULBACTAM 16 INTERMEDIATE Intermediate     PIP/TAZO <=4 SENSITIVE Sensitive     Extended ESBL NEGATIVE Sensitive     * >=100,000 COLONIES/mL ESCHERICHIA COLI  MRSA PCR Screening     Status: None   Collection Time: 09/18/15  7:25 PM  Result Value Ref Range Status   MRSA by PCR NEGATIVE NEGATIVE Final    Comment:        The GeneXpert MRSA Assay (FDA approved for NASAL specimens only), is one component of a comprehensive MRSA colonization surveillance program. It is not intended to diagnose MRSA infection nor to guide or monitor treatment for MRSA infections.     Coagulation Studies: No results for input(s):  LABPROT, INR in the last 72 hours.  Urinalysis: No results for input(s): COLORURINE, LABSPEC, PHURINE, GLUCOSEU, HGBUR, BILIRUBINUR, KETONESUR, PROTEINUR, UROBILINOGEN, NITRITE, LEUKOCYTESUR in the last 72 hours.  Invalid input(s): APPERANCEUR    Imaging: No results found.   Medications:     . calcium carbonate  400 mg of elemental calcium Oral TID WC  . cephALEXin  500 mg Oral Q12H  . cloNIDine  0.1 mg Oral BID  . clopidogrel  75 mg Oral Daily  . heparin subcutaneous  5,000 Units Subcutaneous Q8H  . hydrALAZINE  25 mg Oral Q8H  . insulin aspart  0-5 Units Subcutaneous QHS  . insulin aspart  0-9 Units Subcutaneous TID WC  .  levothyroxine  75 mcg Oral QAC breakfast  . metoprolol  50 mg Oral 2 times per day on Sun Tue Thu Sat  . metoprolol tartrate  50 mg Oral Once per day on Mon Wed Fri  . pravastatin  40 mg Oral QPM   acetaminophen **OR** acetaminophen, hydrALAZINE, ondansetron **OR** ondansetron (ZOFRAN) IV, polyvinyl alcohol  Assessment/ Plan:  [name] is a 82 y.o. white female with end stage renal disease MWF, hypertension, hyperlipidemia, hypothyroidism, diabetes mellitus type II, aortic stenosis, congestive heart failure , who was admitted to [facility] on 09/18/2015  MWF [facility] [place]  1. End Stage Renal Disease: Outpatient needle gauge is 16 with BFR of 350.  Next treatment for Monday.   2. Hypertension: with acute cerebral vascular accident, allow blood pressure to ride higher than baseline.  - hydralazine 25 tid and clonidine 0.79mcg bid started on this admission.  - Continue previous regimen of metoprolol 50mg  bid   3. Anemia of chronic kidney disease: macrocytic: hemoglobin above goal and she is getting mircera as outpatient. Hold due to ischemic event and elevated blood pressures.   4. Secondary Hyperparathyroidism:  - calcium carbonate with meals.    LOS: [place], Arlys Scatena 9/17/201710:24 AM

## 2015-09-22 NOTE — Progress Notes (Signed)
PT FOR DISCHARGE TO WOM. ALERT. NO RESP DISTRESS. Sl x2 d/cd. Prior to discharge. Report called to [place] at [place].  Discharged via w/c  Via  car  To facility at  [facility].no voiced c/o.

## 2015-09-22 NOTE — Clinical Social Work Placement (Signed)
   CLINICAL SOCIAL WORK PLACEMENT  NOTE  Date:  09/22/2015  Patient Details  Name: [name] MRN: [id number] Date of Birth: 1920-08-23  Clinical Social Work is seeking post-discharge placement for this patient at the [place] level of care (*CSW will initial, date and re-position this form in  chart as items are completed):  Yes   Patient/family provided with [facility] Work Department's list of facilities offering this level of care within the geographic area requested by the patient (or if unable, by the patient's family).  Yes   Patient/family informed of their freedom to choose among providers that offer the needed level of care, that participate in Medicare, Medicaid or managed care program needed by the patient, have an available bed and are willing to accept the patient.  Yes   Patient/family informed of [facility]'s ownership interest in [facility] and [facility], as well as of the fact that they are under no obligation to receive care at these facilities.  PASRR submitted to EDS on       PASRR number received on       Existing PASRR number confirmed on 09/20/15     FL2 transmitted to all facilities in geographic area requested by pt/family on 09/20/15     FL2 transmitted to all facilities within larger geographic area on       Patient informed that his/her managed care company has contracts with or will negotiate with certain facilities, including the following:        Yes   Patient/family informed of bed offers received.  Patient chooses bed at  [facility])     Physician recommends and patient chooses bed at  [facility])    Patient to be transferred to  [facility]) on 09/22/15.  Patient to be transferred to facility by Son: Halford Decamp     Patient family notified on 09/22/15 of transfer.  Name of family member notified:  Halford Decamp     PHYSICIAN       Additional Comment:     _______________________________________________ Zettie Pho, LCSW 09/22/2015, 3:27 PM

## 2015-09-22 NOTE — Clinical Social Work Note (Signed)
Patient to dc to [facility] to be transported by her son. Facility is aware and confirmed that patient will have transport to dialysis. CSW will con't to follow for any additional dc needs.  Santiago Bumpers, MSW, LCSW-A [phone]

## 2015-09-23 DIAGNOSIS — N186 End stage renal disease: Secondary | ICD-10-CM | POA: Diagnosis not present

## 2015-09-23 DIAGNOSIS — D509 Iron deficiency anemia, unspecified: Secondary | ICD-10-CM | POA: Diagnosis not present

## 2015-09-23 DIAGNOSIS — Z23 Encounter for immunization: Secondary | ICD-10-CM | POA: Diagnosis not present

## 2015-09-23 DIAGNOSIS — E119 Type 2 diabetes mellitus without complications: Secondary | ICD-10-CM | POA: Diagnosis not present

## 2015-09-23 LAB — CULTURE, BLOOD (ROUTINE X 2): CULTURE: NO GROWTH

## 2015-09-25 DIAGNOSIS — Z23 Encounter for immunization: Secondary | ICD-10-CM | POA: Diagnosis not present

## 2015-09-25 DIAGNOSIS — N186 End stage renal disease: Secondary | ICD-10-CM | POA: Diagnosis not present

## 2015-09-25 DIAGNOSIS — D509 Iron deficiency anemia, unspecified: Secondary | ICD-10-CM | POA: Diagnosis not present

## 2015-09-25 DIAGNOSIS — E119 Type 2 diabetes mellitus without complications: Secondary | ICD-10-CM | POA: Diagnosis not present

## 2015-09-25 LAB — CULTURE, BLOOD (ROUTINE X 2)

## 2015-09-26 DIAGNOSIS — E119 Type 2 diabetes mellitus without complications: Secondary | ICD-10-CM | POA: Diagnosis not present

## 2015-09-26 DIAGNOSIS — I10 Essential (primary) hypertension: Secondary | ICD-10-CM | POA: Diagnosis not present

## 2015-09-26 DIAGNOSIS — N390 Urinary tract infection, site not specified: Secondary | ICD-10-CM | POA: Diagnosis not present

## 2015-09-26 DIAGNOSIS — I63531 Cerebral infarction due to unspecified occlusion or stenosis of right posterior cerebral artery: Secondary | ICD-10-CM | POA: Diagnosis not present

## 2015-09-27 DIAGNOSIS — Z23 Encounter for immunization: Secondary | ICD-10-CM | POA: Diagnosis not present

## 2015-09-27 DIAGNOSIS — N186 End stage renal disease: Secondary | ICD-10-CM | POA: Diagnosis not present

## 2015-09-27 DIAGNOSIS — D509 Iron deficiency anemia, unspecified: Secondary | ICD-10-CM | POA: Diagnosis not present

## 2015-09-27 DIAGNOSIS — E119 Type 2 diabetes mellitus without complications: Secondary | ICD-10-CM | POA: Diagnosis not present

## 2015-09-30 DIAGNOSIS — E119 Type 2 diabetes mellitus without complications: Secondary | ICD-10-CM | POA: Diagnosis not present

## 2015-09-30 DIAGNOSIS — D509 Iron deficiency anemia, unspecified: Secondary | ICD-10-CM | POA: Diagnosis not present

## 2015-09-30 DIAGNOSIS — Z23 Encounter for immunization: Secondary | ICD-10-CM | POA: Diagnosis not present

## 2015-09-30 DIAGNOSIS — N186 End stage renal disease: Secondary | ICD-10-CM | POA: Diagnosis not present

## 2015-10-01 DIAGNOSIS — X32XXXA Exposure to sunlight, initial encounter: Secondary | ICD-10-CM | POA: Diagnosis not present

## 2015-10-01 DIAGNOSIS — Z85828 Personal history of other malignant neoplasm of skin: Secondary | ICD-10-CM | POA: Diagnosis not present

## 2015-10-01 DIAGNOSIS — L570 Actinic keratosis: Secondary | ICD-10-CM | POA: Diagnosis not present

## 2015-10-02 DIAGNOSIS — Z23 Encounter for immunization: Secondary | ICD-10-CM | POA: Diagnosis not present

## 2015-10-02 DIAGNOSIS — E119 Type 2 diabetes mellitus without complications: Secondary | ICD-10-CM | POA: Diagnosis not present

## 2015-10-02 DIAGNOSIS — N186 End stage renal disease: Secondary | ICD-10-CM | POA: Diagnosis not present

## 2015-10-02 DIAGNOSIS — D509 Iron deficiency anemia, unspecified: Secondary | ICD-10-CM | POA: Diagnosis not present

## 2015-10-04 DIAGNOSIS — N186 End stage renal disease: Secondary | ICD-10-CM | POA: Diagnosis not present

## 2015-10-04 DIAGNOSIS — Z23 Encounter for immunization: Secondary | ICD-10-CM | POA: Diagnosis not present

## 2015-10-04 DIAGNOSIS — D509 Iron deficiency anemia, unspecified: Secondary | ICD-10-CM | POA: Diagnosis not present

## 2015-10-04 DIAGNOSIS — E119 Type 2 diabetes mellitus without complications: Secondary | ICD-10-CM | POA: Diagnosis not present

## 2015-10-05 DIAGNOSIS — I120 Hypertensive chronic kidney disease with stage 5 chronic kidney disease or end stage renal disease: Secondary | ICD-10-CM | POA: Diagnosis not present

## 2015-10-05 DIAGNOSIS — Z992 Dependence on renal dialysis: Secondary | ICD-10-CM | POA: Diagnosis not present

## 2015-10-05 DIAGNOSIS — N186 End stage renal disease: Secondary | ICD-10-CM | POA: Diagnosis not present

## 2015-10-07 DIAGNOSIS — D509 Iron deficiency anemia, unspecified: Secondary | ICD-10-CM | POA: Diagnosis not present

## 2015-10-07 DIAGNOSIS — D631 Anemia in chronic kidney disease: Secondary | ICD-10-CM | POA: Diagnosis not present

## 2015-10-07 DIAGNOSIS — N186 End stage renal disease: Secondary | ICD-10-CM | POA: Diagnosis not present

## 2015-10-09 DIAGNOSIS — N186 End stage renal disease: Secondary | ICD-10-CM | POA: Diagnosis not present

## 2015-10-09 DIAGNOSIS — D509 Iron deficiency anemia, unspecified: Secondary | ICD-10-CM | POA: Diagnosis not present

## 2015-10-09 DIAGNOSIS — D631 Anemia in chronic kidney disease: Secondary | ICD-10-CM | POA: Diagnosis not present

## 2015-10-10 DIAGNOSIS — I10 Essential (primary) hypertension: Secondary | ICD-10-CM | POA: Diagnosis not present

## 2015-10-10 DIAGNOSIS — I63531 Cerebral infarction due to unspecified occlusion or stenosis of right posterior cerebral artery: Secondary | ICD-10-CM | POA: Diagnosis not present

## 2015-10-10 DIAGNOSIS — E119 Type 2 diabetes mellitus without complications: Secondary | ICD-10-CM | POA: Diagnosis not present

## 2015-10-10 DIAGNOSIS — N186 End stage renal disease: Secondary | ICD-10-CM | POA: Diagnosis not present

## 2015-10-11 DIAGNOSIS — D509 Iron deficiency anemia, unspecified: Secondary | ICD-10-CM | POA: Diagnosis not present

## 2015-10-11 DIAGNOSIS — N186 End stage renal disease: Secondary | ICD-10-CM | POA: Diagnosis not present

## 2015-10-11 DIAGNOSIS — D631 Anemia in chronic kidney disease: Secondary | ICD-10-CM | POA: Diagnosis not present

## 2015-10-14 DIAGNOSIS — D631 Anemia in chronic kidney disease: Secondary | ICD-10-CM | POA: Diagnosis not present

## 2015-10-14 DIAGNOSIS — D509 Iron deficiency anemia, unspecified: Secondary | ICD-10-CM | POA: Diagnosis not present

## 2015-10-14 DIAGNOSIS — N186 End stage renal disease: Secondary | ICD-10-CM | POA: Diagnosis not present

## 2015-10-15 ENCOUNTER — Telehealth: Payer: Self-pay | Admitting: Cardiovascular Disease

## 2015-10-15 NOTE — Telephone Encounter (Signed)
Okay to restart heart track Perhaps they can monitor blood pressure there

## 2015-10-15 NOTE — Telephone Encounter (Signed)
Pt has an appt with DR. Gollan on 10/31, she asks if it is ok if she resumes heart track before then. Please call her son, [name]

## 2015-10-15 NOTE — Telephone Encounter (Signed)
Pt presented to the office thinking that she had an appt to see Dr. Rockey Situ today.  She was not on the schedule and has not been called for recall yet. Pt has been in the ED 3 x since last ov, most recently for CVA. She would like to know if she is ok'd from a cardiac perspective to resume heart track. She is HOH and stated that she would resume it anyway, she would just like [place] to call her son [name] and let him know this is ok.

## 2015-10-16 DIAGNOSIS — D509 Iron deficiency anemia, unspecified: Secondary | ICD-10-CM | POA: Diagnosis not present

## 2015-10-16 DIAGNOSIS — D631 Anemia in chronic kidney disease: Secondary | ICD-10-CM | POA: Diagnosis not present

## 2015-10-16 DIAGNOSIS — N186 End stage renal disease: Secondary | ICD-10-CM | POA: Diagnosis not present

## 2015-10-17 NOTE — Telephone Encounter (Signed)
Spoke w/ [name].  Advised him of Dr. Donivan Scull recommendation.  He is appreciative and will call back if we can be of further assistance.

## 2015-10-18 ENCOUNTER — Encounter: Payer: Self-pay | Admitting: Emergency Medicine

## 2015-10-18 ENCOUNTER — Emergency Department
Admission: EM | Admit: 2015-10-18 | Discharge: 2015-10-18 | Disposition: A | Discharge status: Home / Self Care | Payer: Medicare Other | Attending: Emergency Medicine | Admitting: Emergency Medicine

## 2015-10-18 ENCOUNTER — Ambulatory Visit: Payer: Medicare Other | Admitting: Cardiovascular Disease

## 2015-10-18 DIAGNOSIS — Y658 Other specified misadventures during surgical and medical care: Secondary | ICD-10-CM | POA: Diagnosis not present

## 2015-10-18 DIAGNOSIS — Z7982 Long term (current) use of aspirin: Secondary | ICD-10-CM | POA: Insufficient documentation

## 2015-10-18 DIAGNOSIS — I509 Heart failure, unspecified: Secondary | ICD-10-CM | POA: Diagnosis not present

## 2015-10-18 DIAGNOSIS — Z951 Presence of aortocoronary bypass graft: Secondary | ICD-10-CM | POA: Diagnosis not present

## 2015-10-18 DIAGNOSIS — I132 Hypertensive heart and chronic kidney disease with heart failure and with stage 5 chronic kidney disease, or end stage renal disease: Secondary | ICD-10-CM | POA: Insufficient documentation

## 2015-10-18 DIAGNOSIS — Z87891 Personal history of nicotine dependence: Secondary | ICD-10-CM | POA: Diagnosis not present

## 2015-10-18 DIAGNOSIS — D509 Iron deficiency anemia, unspecified: Secondary | ICD-10-CM | POA: Diagnosis not present

## 2015-10-18 DIAGNOSIS — E1122 Type 2 diabetes mellitus with diabetic chronic kidney disease: Secondary | ICD-10-CM | POA: Insufficient documentation

## 2015-10-18 DIAGNOSIS — I2510 Atherosclerotic heart disease of native coronary artery without angina pectoris: Secondary | ICD-10-CM | POA: Insufficient documentation

## 2015-10-18 DIAGNOSIS — I770 Arteriovenous fistula, acquired: Secondary | ICD-10-CM | POA: Diagnosis not present

## 2015-10-18 DIAGNOSIS — Z992 Dependence on renal dialysis: Secondary | ICD-10-CM | POA: Diagnosis not present

## 2015-10-18 DIAGNOSIS — Z7984 Long term (current) use of oral hypoglycemic drugs: Secondary | ICD-10-CM | POA: Insufficient documentation

## 2015-10-18 DIAGNOSIS — D631 Anemia in chronic kidney disease: Secondary | ICD-10-CM | POA: Diagnosis not present

## 2015-10-18 DIAGNOSIS — N186 End stage renal disease: Secondary | ICD-10-CM | POA: Diagnosis not present

## 2015-10-18 DIAGNOSIS — T82590A Other mechanical complication of surgically created arteriovenous fistula, initial encounter: Secondary | ICD-10-CM | POA: Diagnosis not present

## 2015-10-18 DIAGNOSIS — Z79899 Other long term (current) drug therapy: Secondary | ICD-10-CM | POA: Insufficient documentation

## 2015-10-18 DIAGNOSIS — R58 Hemorrhage, not elsewhere classified: Secondary | ICD-10-CM | POA: Diagnosis not present

## 2015-10-18 DIAGNOSIS — E039 Hypothyroidism, unspecified: Secondary | ICD-10-CM | POA: Insufficient documentation

## 2015-10-18 NOTE — ED Provider Notes (Signed)
Time Seen: Approximately 1555  I have reviewed the triage notes  Chief Complaint: Bleeding/Bruising   History of Present Illness: [name] is a 82 y.o. female *who presents after being brought to the emergency department via EMS from dialysis center. Patient finished her dialysis treatment today and was D accessed and continued to have bleeding from the site per EMS patient was "" squirting blood at the scene "" the bleeding is somewhat slowed down with pressure though still present. She denies any feelings of lightheadedness or chest pain   Past Medical History:  Diagnosis Date  . Anemia    NOS  . Anxiety   . Aortic stenosis   . Cancer [facility])    Mole on top of head to be removed in July 2013  . CHF (congestive heart failure) ([place])   . Constipation   . Coronary artery disease   . Diabetes mellitus    type II;was on Glipizide but has been off x 81mon  . Dialysis patient ([place])   . Diarrhea   . GERD (gastroesophageal reflux disease)   . History of blood transfusion   . Hyperlipidemia    takes Simvastatin nightly  . Hyperlipidemia   . Hypertension    takes Metoprolol daily  . Hypothyroidism    takes Synthroid daily  . Migraine    hx of  . Oligouria   . Osteoarthritis   . Osteopenia   . Peripheral vascular disease ([place])   . Personal history of colonic polyps   . Pulmonary hypertension   . Renal insufficiency   . Urinary incontinence   . UTI (urinary tract infection)     Patient Active Problem List   Diagnosis Date Noted  . Encephalopathy 09/18/2015  . Altered mental status 09/18/2015  . Preventative health care 05/03/2014  . Peripheral vascular disease ([place]) 12/21/2013  . Toe amputation status ([place]) 08/29/2013  . History of recent fall 04/18/2013  . HTN (hypertension), malignant 03/22/2013  . S/P aortic valve replacement with bioprosthetic valve 11/15/2012  . S/P CABG x 3 11/15/2012  . Anemia of chronic kidney failure 12/22/2010  . Thrombocytopenia ([place])  12/13/2010  . End stage renal disease on dialysis ([place]) 12/13/2010  . S/P aortic valve replacement 08/11/2010  . Arterial insufficiency-lower 07/25/2010  . Constipation, other cause 06/23/2010  . NEURODERMATITIS 04/08/2009  . Carotid arterial disease ([place]) 01/01/2009  . Coronary atherosclerosis 02/11/2007  . AORTIC STENOSIS 10/04/2006  . ANXIETY 05/30/2006  . Pulmonary hypertension 05/30/2006  . Hypothyroidism 05/27/2006  . Type 2 diabetes mellitus with renal manifestations, controlled ([place]) 05/27/2006  . Hyperlipidemia 05/27/2006  . Essential hypertension 05/27/2006  . GERD 05/27/2006  . OSTEOARTHRITIS 05/27/2006  . OSTEOPENIA 05/27/2006  . URINARY INCONTINENCE 05/27/2006    Past Surgical History:  Procedure Laterality Date  . ABDOMINAL HYSTERECTOMY    . adenosine myoview  2007   benign, EF 69%  . AMPUTATION  06/18/2011   Procedure: AMPUTATION DIGIT;  Surgeon: Newt Minion, MD;  Location: [place];  Service: Orthopedics;  Laterality: Left;  Left 2nd Toe Amputation at MTP Joint  . Amputation-left great toe  7/12   Dr Marlou Sa  . AORTIC VALVE REPLACEMENT  2009  . APPENDECTOMY    . AV FISTULA PLACEMENT  12/22/2010   Procedure: ARTERIOVENOUS (AV) FISTULA CREATION;  Surgeon: Hinda Lenis, MD;  Location: [place];  Service: Vascular;  Laterality: Left;  Creation of Left Brachial-Cephalic Fistula  . CARDIAC CATHETERIZATION  2000   cad  . CAROTID  ENDARTERECTOMY  2011   Right  . CATARACT EXTRACTION    . CORONARY ARTERY BYPASS GRAFT     od  . CORONARY ARTERY BYPASS GRAFT  2009  . EYE SURGERY     BIlateral  . INSERTION OF DIALYSIS CATHETER  12/18/2010   Procedure: INSERTION OF DIALYSIS CATHETER;  Surgeon: Mal Misty, MD;  Location: [place];  Service: Vascular;  Laterality: Right;  . REPLACEMENT TOTAL KNEE BILATERAL  05/1998  . TONSILLECTOMY    . Traumatic Amputation of Right DIP joint of Index Finger      Past Surgical History:  Procedure Laterality Date  . ABDOMINAL  HYSTERECTOMY    . adenosine myoview  2007   benign, EF 69%  . AMPUTATION  06/18/2011   Procedure: AMPUTATION DIGIT;  Surgeon: Newt Minion, MD;  Location: [place];  Service: Orthopedics;  Laterality: Left;  Left 2nd Toe Amputation at MTP Joint  . Amputation-left great toe  7/12   Dr Marlou Sa  . AORTIC VALVE REPLACEMENT  2009  . APPENDECTOMY    . AV FISTULA PLACEMENT  12/22/2010   Procedure: ARTERIOVENOUS (AV) FISTULA CREATION;  Surgeon: Hinda Lenis, MD;  Location: [place];  Service: Vascular;  Laterality: Left;  Creation of Left Brachial-Cephalic Fistula  . CARDIAC CATHETERIZATION  2000   cad  . CAROTID ENDARTERECTOMY  2011   Right  . CATARACT EXTRACTION    . CORONARY ARTERY BYPASS GRAFT     od  . CORONARY ARTERY BYPASS GRAFT  2009  . EYE SURGERY     BIlateral  . INSERTION OF DIALYSIS CATHETER  12/18/2010   Procedure: INSERTION OF DIALYSIS CATHETER;  Surgeon: Mal Misty, MD;  Location: [place];  Service: Vascular;  Laterality: Right;  . REPLACEMENT TOTAL KNEE BILATERAL  05/1998  . TONSILLECTOMY    . Traumatic Amputation of Right DIP joint of Index Finger      Current Outpatient Rx  . Order #: [id number] Class: Historical Med  . Order #: [id number] Class: No Print  . Order #: [id number] Class: Normal  . Order #: [id number] Class: Normal  . Order #: [id number] Class: Normal  . Order #: [id number] Class: No Print  . Order #: [id number] Class: Normal  . Order #: [id number] Class: Normal  . Order #: [id number] Class: Normal  . Order #: [id number] Class: Normal  . Order #: [id number] Class: Normal  . Order #: [id number] Class: Normal  . Order #: [id number] Class: No Print    Allergies:  Nitrofurantoin; Captopril; Enalapril maleate; Ramipril; Sulfa antibiotics; and Verapamil  Family History: Family History  Problem Relation Age of Onset  . Family history unknown: Yes    Social History: Social History  Substance Use Topics  . Smoking status: Former Smoker    Years: 15.00    Types: Cigarettes     Quit date: 01/06/1943  . Smokeless tobacco: Never Used  . Alcohol use Yes     Comment: Wine occasionally     Review of Systems:   10 point review of systems was performed and was otherwise negative:  Constitutional: No fever Eyes: No visual disturbances ENT: No sore throat, ear pain Cardiac: No chest pain Respiratory: No shortness of breath, wheezing, or stridor Abdomen: No abdominal pain, no vomiting, No diarrhea Endocrine: No weight loss, No night sweats Extremities: No peripheral edema, cyanosis Skin: No rashes, easy bruising Neurologic: No focal weakness, trouble with speech or swollowing Urologic:Patient is an uric Patient only takes aspirin for blood thinner.  Physical Exam:  ED Triage Vitals  Enc Vitals Group  BP 10/18/15 1600 (!) 201/52     Pulse Rate 10/18/15 1600 71     Resp 10/18/15 1600 16     Temp 10/18/15 1600 97.5 F (36.4 C)     Temp Source 10/18/15 1600 Oral     SpO2 10/18/15 1600 98 %     Weight 10/18/15 1601 123 lb 4.8 oz (55.9 kg)     Height 10/18/15 1601 5\' 3"  (1.6 m)     Head Circumference --      Peak Flow --      Pain Score --      Pain Loc --      Pain Edu? --      Excl. in [place]? --     General: Awake , Alert , and Oriented times 3; GCS 15 Head: Normal cephalic , atraumatic Eyes: Pupils equal , round, reactive to light Nose/Throat: No nasal drainage, patent upper airway without erythema or exudate.  Neck: Supple, Full range of motion, No anterior adenopathy or palpable thyroid masses Lungs: Clear to ascultation without wheezes , rhonchi, or rales Heart: Regular rate, regular rhythm without murmurs , gallops , or rubs Abdomen: Soft, non tender without rebound, guarding , or rigidity; bowel sounds positive and symmetric in all 4 quadrants. No organomegaly .        Extremities: Examination shows left-sided vascular access with the lower part of her AV fistula still accessed. The upper part of her fistula has a small area of active  nonpulsatile bleeding.  Neurologic: normal ambulation, Motor symmetric without deficits, sensory intact Skin: warm, dry, no rashes    ED Course: The patient had Surgicel and hemostatic gauze applied to her wound was a light pressure dressing. The patient's bleeding seemed to be controlled at that point and she was transported to have her access removed. Patient was transported back with a standard pressure dressing that she is supposed to wear for 24 hours. Recheck of the posterior strap shows that she has a good radial pulse there is some stricture venous return though it does not appear swollen. She has full range of motion of her left hand and has no pain with wrist flexion and extension. Clinical Course     Assessment:  Vascular access hemorrhage     Plan: * Outpatient I discussed the management with her son at the bedside. Please return immediately if condition worsens. Please contact her primary physician or the physician you were given for referral. If you have any specialist physicians involved in her treatment and plan please also contact them. Thank you for using [place] [facility] emergency [facility].             Daymon Larsen, MD 10/18/15 [phone]

## 2015-10-18 NOTE — Discharge Instructions (Signed)
Please return immediately if condition worsens. Please contact her primary physician or the physician you were given for referral. If you have any specialist physicians involved in her treatment and plan please also contact them. Thank you for using [place] [facility].  Please leave the current dressing on as instructed. Removal a 24 hours. Do not hesitate to return here for any complications.

## 2015-10-18 NOTE — Progress Notes (Signed)
De accessed pt arterial needle. Applied pressure for 5 minutes. Dressed with gauze, tape and wrapped. No complications or bleeding. Pt. Alert, oriented and no complaints.

## 2015-10-18 NOTE — ED Notes (Signed)
Patient taken by Tammy, EMT-P to dialysis to have fistula de-accessed

## 2015-10-18 NOTE — ED Triage Notes (Signed)
Patient brought in by [facility] from dialysis, patient finished her treatment today and was de-accessed, patient continued to have bleeding from the site. Per EMS when they arrived area was "squirting" blood, bleeding slowed to an ooze.

## 2015-10-21 DIAGNOSIS — D509 Iron deficiency anemia, unspecified: Secondary | ICD-10-CM | POA: Diagnosis not present

## 2015-10-21 DIAGNOSIS — N186 End stage renal disease: Secondary | ICD-10-CM | POA: Diagnosis not present

## 2015-10-21 DIAGNOSIS — D631 Anemia in chronic kidney disease: Secondary | ICD-10-CM | POA: Diagnosis not present

## 2015-10-23 DIAGNOSIS — D509 Iron deficiency anemia, unspecified: Secondary | ICD-10-CM | POA: Diagnosis not present

## 2015-10-23 DIAGNOSIS — N186 End stage renal disease: Secondary | ICD-10-CM | POA: Diagnosis not present

## 2015-10-23 DIAGNOSIS — D631 Anemia in chronic kidney disease: Secondary | ICD-10-CM | POA: Diagnosis not present

## 2015-10-24 ENCOUNTER — Encounter: Payer: Self-pay | Admitting: Internal Medicine

## 2015-10-24 ENCOUNTER — Ambulatory Visit (INDEPENDENT_AMBULATORY_CARE_PROVIDER_SITE_OTHER): Payer: Medicare Other | Admitting: Internal Medicine

## 2015-10-24 VITALS — BP 130/60 | HR 80 | Temp 97.9°F | Wt 108.0 lb

## 2015-10-24 DIAGNOSIS — I63511 Cerebral infarction due to unspecified occlusion or stenosis of right middle cerebral artery: Secondary | ICD-10-CM | POA: Diagnosis not present

## 2015-10-24 DIAGNOSIS — Z992 Dependence on renal dialysis: Secondary | ICD-10-CM

## 2015-10-24 DIAGNOSIS — N186 End stage renal disease: Secondary | ICD-10-CM

## 2015-10-24 DIAGNOSIS — E441 Mild protein-calorie malnutrition: Secondary | ICD-10-CM

## 2015-10-24 NOTE — Assessment & Plan Note (Signed)
Hospital then rehab Back home Minimal residual now Put on clopidogrel

## 2015-10-24 NOTE — Progress Notes (Signed)
Subjective:    Patient ID: [name], female    DOB: 06-17-1920, 82 y.o.   MRN: [id number]  HPI Here for follow up of hospital and then rehab stay  Acute right posterior MCA infarct Had been to [place] then visited niece Then change in status---lost glasses, wasn't doing her usual things Driver got her for dialysis after that---clearly not right when at dialysis (and sent to ER) Reviewed hospital records  Then to [place] she did well there Home 5 days ago Stays by herself-- son with her on weekends (and at times during the week) Shops with son on Saturdays Simple meals --so doesn't have to cook much Goes to [employer] still--she drives there  Uses walker in AM at home to support balance Cane the rest of the time  Transportation for dialysis Still went to [employer] wheelchair  Breathing is fine No chest pain  Current Outpatient Prescriptions on File Prior to Visit  Medication Sig Dispense Refill  . aspirin EC 81 MG tablet Take 81 mg by mouth at bedtime.    . calcium carbonate (TUMS - DOSED IN MG ELEMENTAL CALCIUM) 500 MG chewable tablet Chew 1 tablet (200 mg of elemental calcium total) by mouth 3 (three) times daily with meals.    . cloNIDine (CATAPRES) 0.1 MG tablet Take 1 tablet (0.1 mg total) by mouth 2 (two) times daily. 60 tablet 11  . clopidogrel (PLAVIX) 75 MG tablet Take 1 tablet (75 mg total) by mouth daily. 30 tablet 2  . folic acid (FOLVITE) 0.5 MG tablet Take 0.5 tablets (0.5 mg total) by mouth daily. (Patient taking differently: Take 0.5 mg by mouth every evening. )    . hydrALAZINE (APRESOLINE) 25 MG tablet Take 1 tablet (25 mg total) by mouth every 8 (eight) hours. 90 tablet 1  . levothyroxine (SYNTHROID, LEVOTHROID) 75 MCG tablet TAKE ONE TABLET BY MOUTH ONCE DAILY 30 tablet 11  . metoprolol (LOPRESSOR) 50 MG tablet Take 1 tablet (50 mg total) by mouth 2 (two) times daily. (Patient taking differently: Take 50 mg by mouth at  bedtime. Take twice a day on Tuesdays, Thursdays, Saturdays and Sundays. Take only in the evening on Mondays, Wednesdays and Fridays.) 180 tablet 3  . polyvinyl alcohol (LIQUIFILM TEARS) 1.4 % ophthalmic solution Place 1 drop into both eyes as needed for dry eyes. 15 mL 0  . pravastatin (PRAVACHOL) 40 MG tablet Take 1 tablet (40 mg total) by mouth every evening. 90 tablet 4  . vitamin C (VITAMIN C) 500 MG tablet Take 1 tablet (500 mg total) by mouth daily.     No current facility-administered medications on file prior to visit.     Allergies  Allergen Reactions  . Nitrofurantoin Itching  . Captopril Other (See Comments)    REACTION: unspecified  . Enalapril Maleate Cough  . Ramipril Other (See Comments)    REACTION: unspecified  . Sulfa Antibiotics Other (See Comments)    Reaction unknown  . Verapamil Other (See Comments)    REACTION: unspecified    Past Medical History:  Diagnosis Date  . Anemia    NOS  . Anxiety   . Aortic stenosis   . Cancer [facility])    Mole on top of head to be removed in July 2013  . CHF (congestive heart failure) ([place])   . Constipation   . Coronary artery disease   . Diabetes mellitus    type II;was on Glipizide but has been off  x 57mon  . Dialysis patient ([place])   . Diarrhea   . GERD (gastroesophageal reflux disease)   . History of blood transfusion   . Hyperlipidemia    takes Simvastatin nightly  . Hyperlipidemia   . Hypertension    takes Metoprolol daily  . Hypothyroidism    takes Synthroid daily  . Migraine    hx of  . Oligouria   . Osteoarthritis   . Osteopenia   . Peripheral vascular disease ([place])   . Personal history of colonic polyps   . Pulmonary hypertension   . Renal insufficiency   . Urinary incontinence   . UTI (urinary tract infection)     Past Surgical History:  Procedure Laterality Date  . ABDOMINAL HYSTERECTOMY    . adenosine myoview  2007   benign, EF 69%  . AMPUTATION  06/18/2011   Procedure: AMPUTATION DIGIT;   Surgeon: Newt Minion, MD;  Location: [place];  Service: Orthopedics;  Laterality: Left;  Left 2nd Toe Amputation at MTP Joint  . Amputation-left great toe  7/12   Dr Marlou Sa  . AORTIC VALVE REPLACEMENT  2009  . APPENDECTOMY    . AV FISTULA PLACEMENT  12/22/2010   Procedure: ARTERIOVENOUS (AV) FISTULA CREATION;  Surgeon: Hinda Lenis, MD;  Location: [place];  Service: Vascular;  Laterality: Left;  Creation of Left Brachial-Cephalic Fistula  . CARDIAC CATHETERIZATION  2000   cad  . CAROTID ENDARTERECTOMY  2011   Right  . CATARACT EXTRACTION    . CORONARY ARTERY BYPASS GRAFT     od  . CORONARY ARTERY BYPASS GRAFT  2009  . EYE SURGERY     BIlateral  . INSERTION OF DIALYSIS CATHETER  12/18/2010   Procedure: INSERTION OF DIALYSIS CATHETER;  Surgeon: Mal Misty, MD;  Location: [place];  Service: Vascular;  Laterality: Right;  . REPLACEMENT TOTAL KNEE BILATERAL  05/1998  . TONSILLECTOMY    . Traumatic Amputation of Right DIP joint of Index Finger      Family History  Problem Relation Age of Onset  . Family history unknown: Yes    Social History   Social History  . Marital status: Widowed    Spouse name: [name]  . Number of children: 1  . Years of education: [name]   Occupational History  . 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 Smoking status: Former Smoker    Years: 15.00    Types: Cigarettes    Quit date: 01/06/1943  . Smokeless tobacco: Never Used  . Alcohol use Yes     Comment: Wine occasionally  . Drug use: No  . Sexual activity: No   Other Topics Concern  . Not on file   Social History Narrative   Son Evlyn Clines to make decisions for her   Discussed DNR and she requests --done 05/03/14   No tube feeds if cognitively unaware   Review of Systems Couldn't use the current hearing aides---has ordered more Appetite is okay Weight down 5# from her baseline--feels she is on the way back up Drinks 1/2 boost a day    Objective:    Physical Exam  Constitutional: No distress.  Neck: No thyromegaly present.  Cardiovascular: Normal rate and regular rhythm.  Exam reveals no gallop.   Gr 3/6 systolic murmur  Pulmonary/Chest: Effort normal and breath sounds normal. No respiratory distress. She has no wheezes. She has no rales.  Abdominal: Soft. There is no tenderness.  Musculoskeletal:  She exhibits no edema.  Lymphadenopathy:    She has no cervical adenopathy.  Psychiatric: She has a normal mood and affect. Her behavior is normal.          Assessment & Plan:

## 2015-10-24 NOTE — Progress Notes (Signed)
Pre visit review using our clinic review tool, if applicable. No additional management support is needed unless otherwise documented below in the visit note. 

## 2015-10-24 NOTE — Assessment & Plan Note (Signed)
Lost weight but starting to regain back at home Limited to 1/2 can supplement due to renal failure

## 2015-10-24 NOTE — Assessment & Plan Note (Signed)
Doing okay with her usual dialysis schedule

## 2015-10-25 ENCOUNTER — Ambulatory Visit: Payer: Medicare Other | Admitting: Internal Medicine

## 2015-10-25 DIAGNOSIS — N186 End stage renal disease: Secondary | ICD-10-CM | POA: Diagnosis not present

## 2015-10-25 DIAGNOSIS — D509 Iron deficiency anemia, unspecified: Secondary | ICD-10-CM | POA: Diagnosis not present

## 2015-10-25 DIAGNOSIS — D631 Anemia in chronic kidney disease: Secondary | ICD-10-CM | POA: Diagnosis not present

## 2015-10-28 DIAGNOSIS — D631 Anemia in chronic kidney disease: Secondary | ICD-10-CM | POA: Diagnosis not present

## 2015-10-28 DIAGNOSIS — N186 End stage renal disease: Secondary | ICD-10-CM | POA: Diagnosis not present

## 2015-10-28 DIAGNOSIS — D509 Iron deficiency anemia, unspecified: Secondary | ICD-10-CM | POA: Diagnosis not present

## 2015-10-29 DIAGNOSIS — H179 Unspecified corneal scar and opacity: Secondary | ICD-10-CM | POA: Diagnosis not present

## 2015-10-30 DIAGNOSIS — D631 Anemia in chronic kidney disease: Secondary | ICD-10-CM | POA: Diagnosis not present

## 2015-10-30 DIAGNOSIS — N186 End stage renal disease: Secondary | ICD-10-CM | POA: Diagnosis not present

## 2015-10-30 DIAGNOSIS — D509 Iron deficiency anemia, unspecified: Secondary | ICD-10-CM | POA: Diagnosis not present

## 2015-11-01 DIAGNOSIS — N186 End stage renal disease: Secondary | ICD-10-CM | POA: Diagnosis not present

## 2015-11-01 DIAGNOSIS — D509 Iron deficiency anemia, unspecified: Secondary | ICD-10-CM | POA: Diagnosis not present

## 2015-11-01 DIAGNOSIS — D631 Anemia in chronic kidney disease: Secondary | ICD-10-CM | POA: Diagnosis not present

## 2015-11-04 ENCOUNTER — Telehealth: Payer: Self-pay

## 2015-11-04 DIAGNOSIS — D631 Anemia in chronic kidney disease: Secondary | ICD-10-CM | POA: Diagnosis not present

## 2015-11-04 DIAGNOSIS — N186 End stage renal disease: Secondary | ICD-10-CM | POA: Diagnosis not present

## 2015-11-04 DIAGNOSIS — D509 Iron deficiency anemia, unspecified: Secondary | ICD-10-CM | POA: Diagnosis not present

## 2015-11-04 NOTE — Telephone Encounter (Signed)
[name] (DPR signed) request refill clopidogrel. Spoke with [place] at [address] ;pt has available refills and will get ready for pick up in one hr. [name] voiced understanding.

## 2015-11-05 ENCOUNTER — Encounter: Payer: Self-pay | Admitting: Cardiovascular Disease

## 2015-11-05 ENCOUNTER — Ambulatory Visit (INDEPENDENT_AMBULATORY_CARE_PROVIDER_SITE_OTHER): Payer: Medicare Other | Admitting: Cardiovascular Disease

## 2015-11-05 VITALS — BP 150/60 | HR 66 | Ht 61.0 in | Wt 109.0 lb

## 2015-11-05 DIAGNOSIS — Z992 Dependence on renal dialysis: Secondary | ICD-10-CM | POA: Diagnosis not present

## 2015-11-05 DIAGNOSIS — I2510 Atherosclerotic heart disease of native coronary artery without angina pectoris: Secondary | ICD-10-CM

## 2015-11-05 DIAGNOSIS — I10 Essential (primary) hypertension: Secondary | ICD-10-CM | POA: Diagnosis not present

## 2015-11-05 DIAGNOSIS — R9431 Abnormal electrocardiogram [ECG] [EKG]: Secondary | ICD-10-CM

## 2015-11-05 DIAGNOSIS — E785 Hyperlipidemia, unspecified: Secondary | ICD-10-CM

## 2015-11-05 DIAGNOSIS — I120 Hypertensive chronic kidney disease with stage 5 chronic kidney disease or end stage renal disease: Secondary | ICD-10-CM | POA: Diagnosis not present

## 2015-11-05 DIAGNOSIS — N186 End stage renal disease: Secondary | ICD-10-CM | POA: Diagnosis not present

## 2015-11-05 DIAGNOSIS — Z951 Presence of aortocoronary bypass graft: Secondary | ICD-10-CM | POA: Diagnosis not present

## 2015-11-05 NOTE — Patient Instructions (Addendum)
Medication Instructions:   No medication changes made  Labwork:  No new labs needed  Testing/Procedures:  We will order a treadmill stress test for CAD, CABG, new abn EKG  Follow-Up: It was a pleasure seeing you in the office today. Please call us if you have new issues that need to be addressed before your next appt.  [phone]  Your physician wants you to follow-up in: 6 months.  You will receive a reminder letter in the mail two months in advance. If you don't receive a letter, please call our office to schedule the follow-up appointment.  If you need a refill on your cardiac medications before your next appointment, please call your pharmacy.  [place]  Your caregiver has ordered a Stress Test with nuclear imaging. The purpose of this test is to evaluate the blood supply to your heart muscle. This procedure is referred to as a "Non-Invasive Stress Test." This is because other than having an IV started in your vein, nothing is inserted or "invades" your body. Cardiac stress tests are done to find areas of poor blood flow to the heart by determining the extent of coronary artery disease (CAD). Some patients exercise on a treadmill, which naturally increases the blood flow to your heart, while others who are  unable to walk on a treadmill due to physical limitations have a pharmacologic/chemical stress agent called Lexiscan . This medicine will mimic walking on a treadmill by temporarily increasing your coronary blood flow.   Please note: these test may take anywhere between 2-4 hours to complete  PLEASE REPORT TO [place] AT THE FIRST DESK WILL DIRECT YOU WHERE TO GO  Date of Procedure:_____Tuesday, November 14____________  Arrival Time for Procedure:____7:15 am____________  Instructions regarding medication:   __X__:  Hold METOPROLOL the night before and morning of procedure   How to prepare for your Myoview test:  1. Do not eat  or drink after midnight 2. No caffeine for 24 hours prior to test 3. No smoking 24 hours prior to test. 4. Your medication may be taken with water.  If your doctor stopped a medication because of this test, do not take that medication. 5. Ladies, please do not wear dresses.  Skirts or pants are appropriate. Please wear a short sleeve shirt. 6. No perfume, cologne or lotion. 7. Wear comfortable walking shoes. No heels!     Cardiac Nuclear Scanning A cardiac nuclear scan is used to check your heart for problems, such as the following:  A portion of the heart is not getting enough blood.  Part of the heart muscle has died, which happens with a heart attack.  The heart wall is not working normally.  In this test, a radioactive dye (tracer) is injected into your bloodstream. After the tracer has traveled to your heart, a scanning device is used to measure how much of the tracer is absorbed by or distributed to various areas of your heart. LET [facility] CARE PROVIDER KNOW ABOUT:  Any allergies you have.  All medicines you are taking, including vitamins, herbs, eye drops, creams, and over-the-counter medicines.  Previous problems you or members of your family have had with the use of anesthetics.  Any blood disorders you have.  Previous surgeries you have had.  Medical conditions you have.  RISKS AND COMPLICATIONS Generally, this is a safe procedure. However, as with any procedure, problems can occur. Possible problems include:   Serious chest pain.  Rapid heartbeat.  Sensation of warmth in your chest. This usually passes quickly. BEFORE THE PROCEDURE Ask your health care provider about changing or stopping your regular medicines. PROCEDURE This procedure is usually done at a hospital and takes 2-4 hours.  An IV tube is inserted into one of your veins.  Your health care provider will inject a small amount of radioactive tracer through the tube.  You will then wait for  20-40 minutes while the tracer travels through your bloodstream.  You will lie down on an exam table so images of your heart can be taken. Images will be taken for about 15-20 minutes.  You will exercise on a treadmill or stationary bike. While you exercise, your heart activity will be monitored with an electrocardiogram (ECG), and your blood pressure will be checked.  If you are unable to exercise, you may be given a medicine to make your heart beat faster.  When blood flow to your heart has peaked, tracer will again be injected through the IV tube.  After 20-40 minutes, you will get back on the exam table and have more images taken of your heart.  When the procedure is over, your IV tube will be removed. AFTER THE PROCEDURE  You will likely be able to leave shortly after the test. Unless your health care provider tells you otherwise, you may return to your normal schedule, including diet, activities, and medicines.  Make sure you find out how and when you will get your test results.   This information is not intended to replace advice given to you by your health care provider. Make sure you discuss any questions you have with your health care provider.   Document Released: 01/17/2004 Document Revised: 12/27/2012 Document Reviewed: 11/30/2012 Elsevier Interactive Patient Education [employer].

## 2015-11-05 NOTE — Progress Notes (Signed)
Cardiology Office Note  Date:  11/05/2015   ID:  NGELA DONZE, DOB 04/03/1920, MRN [id number]  PCP:  Viviana Simpler, MD   Chief Complaint  Patient presents with  . Hypertension  . Hyperlipidemia  . Recent CVA    HPI:  Mrs Henegar is a very pleasant 82 year old woman with history of CAD, bypass surgery x3 vessel in 2009 with bioprosthetic aortic valve placed at that time for severe aortic valve stenosis, diet controlled diabetes, previous smoking, moderate bilateral carotid disease, end-stage renal disease on hemodialysis 3 days per week for the past 2 years who presents for routine followup of her  Coronary artery disease  In follow-up today, she reports that in early September she went to dialysis, did not feel well. Sent to the emergency room Diagnosed with UTI,  in hospital 5 days, 9/13th Abn EKG on arrival, troponin normal (checked one time) She denied any chest pain but reports overall did not feel well  She spent some time in Rehab 20 days Now eating better, feeling better Has restarted heart track, did treadmill today with no symptoms of chest discomfort  Blood pressure on arrival, 150/68  EKG on today's visit shows normal sinus rhythm with rate 65 bpm, left axis deviation, new ST and T wave abnormality V5, V6, 3, aVF, unable to exclude lateral infarct. Continue the changes appear new compared to prior EKG  Other past medical history History of incontinence Total cholesterol 158, hemoglobin A1c 5.9  hemodialysis on March 22 2013 and had syncope. She was taken to the hospital and diagnosed with urinary tract infection with encephalopathy. She was given antibiotics with improvement of her symptoms. Troponin 0.37 and 0.41. This was felt to be secondary to demand ischemia.  Remote history of MRSA and C. Difficile in December 2014 Echocardiogram in late 2014, well-seated valve   PMH:   has a past medical history of Anemia; Anxiety; Aortic stenosis; Cancer [facility]); CHF  (congestive heart failure) ([place]); Constipation; Coronary artery disease; Diabetes mellitus; Dialysis patient [facility]); Diarrhea; GERD (gastroesophageal reflux disease); History of blood transfusion; Hyperlipidemia; Hyperlipidemia; Hypertension; Hypothyroidism; Migraine; Oligouria; Osteoarthritis; Osteopenia; Peripheral vascular disease ([place]); Personal history of colonic polyps; Pulmonary hypertension; Renal insufficiency; Urinary incontinence; and UTI (urinary tract infection).  PSH:    Past Surgical History:  Procedure Laterality Date  . ABDOMINAL HYSTERECTOMY    . adenosine myoview  2007   benign, EF 69%  . AMPUTATION  06/18/2011   Procedure: AMPUTATION DIGIT;  Surgeon: Newt Minion, MD;  Location: [place];  Service: Orthopedics;  Laterality: Left;  Left 2nd Toe Amputation at MTP Joint  . Amputation-left great toe  7/12   Dr Marlou Sa  . AORTIC VALVE REPLACEMENT  2009  . APPENDECTOMY    . AV FISTULA PLACEMENT  12/22/2010   Procedure: ARTERIOVENOUS (AV) FISTULA CREATION;  Surgeon: Hinda Lenis, MD;  Location: [place];  Service: Vascular;  Laterality: Left;  Creation of Left Brachial-Cephalic Fistula  . CARDIAC CATHETERIZATION  2000   cad  . CAROTID ENDARTERECTOMY  2011   Right  . CATARACT EXTRACTION    . CORONARY ARTERY BYPASS GRAFT     od  . CORONARY ARTERY BYPASS GRAFT  2009  . EYE SURGERY     BIlateral  . INSERTION OF DIALYSIS CATHETER  12/18/2010   Procedure: INSERTION OF DIALYSIS CATHETER;  Surgeon: Mal Misty, MD;  Location: [place];  Service: Vascular;  Laterality: Right;  . REPLACEMENT TOTAL KNEE BILATERAL  05/1998  . TONSILLECTOMY    .  Traumatic Amputation of Right DIP joint of Index Finger      Current Outpatient Prescriptions  Medication Sig Dispense Refill  . amLODipine (NORVASC) 5 MG tablet Take 1 tablet by mouth daily.    Marland Kitchen aspirin EC 81 MG tablet Take 81 mg by mouth at bedtime.    . calcium carbonate (TUMS - DOSED IN MG ELEMENTAL CALCIUM) 500 MG chewable tablet Chew  1 tablet (200 mg of elemental calcium total) by mouth 3 (three) times daily with meals.    . cloNIDine (CATAPRES) 0.1 MG tablet Take 1 tablet (0.1 mg total) by mouth 2 (two) times daily. 60 tablet 11  . clopidogrel (PLAVIX) 75 MG tablet Take 1 tablet (75 mg total) by mouth daily. 30 tablet 2  . folic acid (FOLVITE) 0.5 MG tablet Take 0.5 tablets (0.5 mg total) by mouth daily.    . hydrALAZINE (APRESOLINE) 25 MG tablet Take 1 tablet (25 mg total) by mouth every 8 (eight) hours. 90 tablet 1  . levothyroxine (SYNTHROID, LEVOTHROID) 75 MCG tablet TAKE ONE TABLET BY MOUTH ONCE DAILY 30 tablet 11  . metoprolol (LOPRESSOR) 50 MG tablet Take 1 tablet (50 mg total) by mouth 2 (two) times daily. 180 tablet 3  . polyvinyl alcohol (LIQUIFILM TEARS) 1.4 % ophthalmic solution Place 1 drop into both eyes as needed for dry eyes. 15 mL 0  . pravastatin (PRAVACHOL) 40 MG tablet Take 1 tablet (40 mg total) by mouth every evening. 90 tablet 4  . vitamin C (VITAMIN C) 500 MG tablet Take 1 tablet (500 mg total) by mouth daily.     No current facility-administered medications for this visit.      Allergies:   Nitrofurantoin; Captopril; Enalapril maleate; Ramipril; Sulfa antibiotics; and Verapamil   Social History:  The patient  reports that she quit smoking about 72 years ago. Her smoking use included Cigarettes. She quit after 15.00 years of use. She has never used smokeless tobacco. She reports that she drinks alcohol. She reports that she does not use drugs.   Family History:   Family history is unknown by patient.    Review of Systems: Review of Systems  Constitutional: Negative.   Respiratory: Negative.   Cardiovascular: Negative.   Gastrointestinal: Negative.   Musculoskeletal: Negative.   Neurological: Negative.   Psychiatric/Behavioral: Negative.   All other systems reviewed and are negative.    PHYSICAL EXAM: VS:  BP (!) 150/60   Pulse 66   Ht 5\' 1"  (1.549 m)   Wt 109 lb (49.4 kg)   SpO2  (!) 66%   BMI 20.60 kg/m  , BMI Body mass index is 20.6 kg/m. GEN: Well nourished, well developed, in no acute distress  HEENT: normal  Neck: no JVD, carotid bruits, or masses Cardiac: RRR; no murmurs, rubs, or gallops,no edema  Respiratory:  clear to auscultation bilaterally, normal work of breathing GI: soft, nontender, nondistended, + BS MS: no deformity or atrophy  Skin: warm and dry, no rash Neuro:  Strength and sensation are intact Psych: euthymic mood, full affect    Recent Labs: 05/21/2015: TSH 1.79 09/18/2015: ALT 17 09/20/2015: Hemoglobin 11.6; Platelets 103 09/21/2015: BUN 22; Creatinine, Ser 3.46; Potassium 4.2; Sodium 137    Lipid Panel Lab Results  Component Value Date   CHOL 167 05/21/2015   HDL 43.80 05/21/2015   LDLCALC 95 05/21/2015   TRIG 142.0 05/21/2015      Wt Readings from Last 3 Encounters:  11/05/15 109 lb (49.4 kg)  10/24/15 108  lb (49 kg)  10/18/15 123 lb 4.8 oz (55.9 kg)       ASSESSMENT AND PLAN:  Essential hypertension - Plan: EKG 12-Lead, NM Myocar Multi W/Spect W/Wall Motion / EF  blood pressure mildly elevated on today's visit. Marland Kitchen Recommended she monitor her pressures at home and call our office if this continues to run high   Hyperlipidemia, unspecified hyperlipidemia type - Plan: EKG 12-Lead, NM Myocar Multi W/Spect W/Wall Motion / EF  cholesterol appears to be above goal. Ideally LDL should run less than 70  We'll discuss with her again in follow-up  May need to add Zetia  CAD in native artery - Plan: NM Myocar Multi W/Spect W/Wall Motion / EF  Hx of CABG - Plan: NM Myocar Multi W/Spect W/Wall Motion / EF  history of coronary artery disease, bypass surgery  Currently in heart track   Abnormal EKG -  abnormal EKG compared to prior studies. Concerning as this happened when she was in the hospital . Troponin only checked on arrival but not during her hospital stay . She did not feel well at that time  Clearly a change compared  to previous studies   recommended she have pharmacologic Myoview to rule out ischemia  She is in agreement. Details discussed with her   Total encounter time more than 25 minutes  Greater than 50% was spent in counseling and coordination of care with the patient    Disposition:   F/U  6 months   Orders Placed This Encounter  Procedures  . NM Myocar Multi W/Spect W/Wall Motion / EF  . EKG 12-Lead     Signed, Esmond Plants, M.D., Ph.D. 11/05/2015  [employer], [place]

## 2015-11-06 DIAGNOSIS — D509 Iron deficiency anemia, unspecified: Secondary | ICD-10-CM | POA: Diagnosis not present

## 2015-11-06 DIAGNOSIS — D631 Anemia in chronic kidney disease: Secondary | ICD-10-CM | POA: Diagnosis not present

## 2015-11-06 DIAGNOSIS — N186 End stage renal disease: Secondary | ICD-10-CM | POA: Diagnosis not present

## 2015-11-08 DIAGNOSIS — D631 Anemia in chronic kidney disease: Secondary | ICD-10-CM | POA: Diagnosis not present

## 2015-11-08 DIAGNOSIS — N186 End stage renal disease: Secondary | ICD-10-CM | POA: Diagnosis not present

## 2015-11-08 DIAGNOSIS — D509 Iron deficiency anemia, unspecified: Secondary | ICD-10-CM | POA: Diagnosis not present

## 2015-11-11 DIAGNOSIS — D631 Anemia in chronic kidney disease: Secondary | ICD-10-CM | POA: Diagnosis not present

## 2015-11-11 DIAGNOSIS — D509 Iron deficiency anemia, unspecified: Secondary | ICD-10-CM | POA: Diagnosis not present

## 2015-11-11 DIAGNOSIS — N186 End stage renal disease: Secondary | ICD-10-CM | POA: Diagnosis not present

## 2015-11-12 ENCOUNTER — Telehealth: Payer: Self-pay

## 2015-11-12 MED ORDER — AMLODIPINE BESYLATE 5 MG PO TABS: 5.0000 mg | ORAL_TABLET | Freq: Every day | ORAL | 3 refills | Status: DC

## 2015-11-12 NOTE — Telephone Encounter (Signed)
Pt's son is requesting a refill on amlodipine. It was prescribed by Dr. Posey Pronto. Ok to refill? Pt was seen on 05/21/15, please advise. Thank you.

## 2015-11-12 NOTE — Telephone Encounter (Signed)
Okay to fill for a year

## 2015-11-12 NOTE — Telephone Encounter (Signed)
Rx sent electronically.  

## 2015-11-13 DIAGNOSIS — D631 Anemia in chronic kidney disease: Secondary | ICD-10-CM | POA: Diagnosis not present

## 2015-11-13 DIAGNOSIS — D509 Iron deficiency anemia, unspecified: Secondary | ICD-10-CM | POA: Diagnosis not present

## 2015-11-13 DIAGNOSIS — N186 End stage renal disease: Secondary | ICD-10-CM | POA: Diagnosis not present

## 2015-11-15 DIAGNOSIS — D631 Anemia in chronic kidney disease: Secondary | ICD-10-CM | POA: Diagnosis not present

## 2015-11-15 DIAGNOSIS — D509 Iron deficiency anemia, unspecified: Secondary | ICD-10-CM | POA: Diagnosis not present

## 2015-11-15 DIAGNOSIS — N186 End stage renal disease: Secondary | ICD-10-CM | POA: Diagnosis not present

## 2015-11-18 DIAGNOSIS — D631 Anemia in chronic kidney disease: Secondary | ICD-10-CM | POA: Diagnosis not present

## 2015-11-18 DIAGNOSIS — D509 Iron deficiency anemia, unspecified: Secondary | ICD-10-CM | POA: Diagnosis not present

## 2015-11-18 DIAGNOSIS — N186 End stage renal disease: Secondary | ICD-10-CM | POA: Diagnosis not present

## 2015-11-19 ENCOUNTER — Encounter
Admission: RE | Admit: 2015-11-19 | Discharge: 2015-11-19 | Disposition: A | Discharge status: Home / Self Care | Payer: Medicare Other | Source: Ambulatory Visit | Attending: Cardiovascular Disease | Admitting: Cardiovascular Disease

## 2015-11-19 DIAGNOSIS — E785 Hyperlipidemia, unspecified: Secondary | ICD-10-CM

## 2015-11-19 DIAGNOSIS — I2510 Atherosclerotic heart disease of native coronary artery without angina pectoris: Secondary | ICD-10-CM | POA: Diagnosis not present

## 2015-11-19 DIAGNOSIS — Z951 Presence of aortocoronary bypass graft: Secondary | ICD-10-CM | POA: Diagnosis not present

## 2015-11-19 DIAGNOSIS — I10 Essential (primary) hypertension: Secondary | ICD-10-CM | POA: Diagnosis not present

## 2015-11-19 DIAGNOSIS — R9431 Abnormal electrocardiogram [ECG] [EKG]: Secondary | ICD-10-CM

## 2015-11-19 MED ORDER — TECHNETIUM TC 99M TETROFOSMIN IV KIT
13.0000 | PACK | Freq: Once | INTRAVENOUS | Status: AC | PRN
  Administered 2015-11-19: 13.077 via INTRAVENOUS

## 2015-11-19 MED ORDER — REGADENOSON 0.4 MG/5ML IV SOLN
0.4000 mg | Freq: Once | INTRAVENOUS | Status: AC
  Administered 2015-11-19: 0.4 mg via INTRAVENOUS

## 2015-11-19 MED ORDER — TECHNETIUM TC 99M TETROFOSMIN IV KIT
29.5620 | PACK | Freq: Once | INTRAVENOUS | Status: AC | PRN
  Administered 2015-11-19: 29.562 via INTRAVENOUS

## 2015-11-20 DIAGNOSIS — N186 End stage renal disease: Secondary | ICD-10-CM | POA: Diagnosis not present

## 2015-11-20 DIAGNOSIS — D509 Iron deficiency anemia, unspecified: Secondary | ICD-10-CM | POA: Diagnosis not present

## 2015-11-20 DIAGNOSIS — D631 Anemia in chronic kidney disease: Secondary | ICD-10-CM | POA: Diagnosis not present

## 2015-11-20 LAB — NM MYOCAR MULTI W/SPECT W/WALL MOTION / EF
CHL CUP NUCLEAR SRS: 0
CHL CUP NUCLEAR SSS: 9
CSEPHR: 62 %
CSEPPHR: 80 {beats}/min
LV dias vol: 54 mL (ref 46–106)
LV sys vol: 26 mL
Rest HR: 67 {beats}/min
SDS: 9
TID: 0.94

## 2015-11-22 DIAGNOSIS — D509 Iron deficiency anemia, unspecified: Secondary | ICD-10-CM | POA: Diagnosis not present

## 2015-11-22 DIAGNOSIS — D631 Anemia in chronic kidney disease: Secondary | ICD-10-CM | POA: Diagnosis not present

## 2015-11-22 DIAGNOSIS — N186 End stage renal disease: Secondary | ICD-10-CM | POA: Diagnosis not present

## 2015-11-24 ENCOUNTER — Other Ambulatory Visit: Payer: Self-pay | Admitting: Cardiovascular Disease

## 2015-11-24 DIAGNOSIS — N186 End stage renal disease: Secondary | ICD-10-CM | POA: Diagnosis not present

## 2015-11-24 DIAGNOSIS — D631 Anemia in chronic kidney disease: Secondary | ICD-10-CM | POA: Diagnosis not present

## 2015-11-24 DIAGNOSIS — D509 Iron deficiency anemia, unspecified: Secondary | ICD-10-CM | POA: Diagnosis not present

## 2015-11-25 MED ORDER — METOPROLOL TARTRATE 50 MG PO TABS: 50.0000 mg | ORAL_TABLET | Freq: Two times a day (BID) | ORAL | 3 refills | Status: DC

## 2015-11-26 DIAGNOSIS — D631 Anemia in chronic kidney disease: Secondary | ICD-10-CM | POA: Diagnosis not present

## 2015-11-26 DIAGNOSIS — N186 End stage renal disease: Secondary | ICD-10-CM | POA: Diagnosis not present

## 2015-11-26 DIAGNOSIS — D509 Iron deficiency anemia, unspecified: Secondary | ICD-10-CM | POA: Diagnosis not present

## 2015-11-29 DIAGNOSIS — D509 Iron deficiency anemia, unspecified: Secondary | ICD-10-CM | POA: Diagnosis not present

## 2015-11-29 DIAGNOSIS — N186 End stage renal disease: Secondary | ICD-10-CM | POA: Diagnosis not present

## 2015-11-29 DIAGNOSIS — D631 Anemia in chronic kidney disease: Secondary | ICD-10-CM | POA: Diagnosis not present

## 2015-12-02 DIAGNOSIS — N186 End stage renal disease: Secondary | ICD-10-CM | POA: Diagnosis not present

## 2015-12-02 DIAGNOSIS — D509 Iron deficiency anemia, unspecified: Secondary | ICD-10-CM | POA: Diagnosis not present

## 2015-12-02 DIAGNOSIS — D631 Anemia in chronic kidney disease: Secondary | ICD-10-CM | POA: Diagnosis not present

## 2015-12-04 DIAGNOSIS — N186 End stage renal disease: Secondary | ICD-10-CM | POA: Diagnosis not present

## 2015-12-04 DIAGNOSIS — D509 Iron deficiency anemia, unspecified: Secondary | ICD-10-CM | POA: Diagnosis not present

## 2015-12-04 DIAGNOSIS — D631 Anemia in chronic kidney disease: Secondary | ICD-10-CM | POA: Diagnosis not present

## 2015-12-05 DIAGNOSIS — I120 Hypertensive chronic kidney disease with stage 5 chronic kidney disease or end stage renal disease: Secondary | ICD-10-CM | POA: Diagnosis not present

## 2015-12-05 DIAGNOSIS — N186 End stage renal disease: Secondary | ICD-10-CM | POA: Diagnosis not present

## 2015-12-05 DIAGNOSIS — Z992 Dependence on renal dialysis: Secondary | ICD-10-CM | POA: Diagnosis not present

## 2015-12-06 DIAGNOSIS — E119 Type 2 diabetes mellitus without complications: Secondary | ICD-10-CM | POA: Diagnosis not present

## 2015-12-06 DIAGNOSIS — D631 Anemia in chronic kidney disease: Secondary | ICD-10-CM | POA: Diagnosis not present

## 2015-12-06 DIAGNOSIS — N186 End stage renal disease: Secondary | ICD-10-CM | POA: Diagnosis not present

## 2015-12-09 DIAGNOSIS — E119 Type 2 diabetes mellitus without complications: Secondary | ICD-10-CM | POA: Diagnosis not present

## 2015-12-09 DIAGNOSIS — D631 Anemia in chronic kidney disease: Secondary | ICD-10-CM | POA: Diagnosis not present

## 2015-12-09 DIAGNOSIS — N186 End stage renal disease: Secondary | ICD-10-CM | POA: Diagnosis not present

## 2015-12-11 DIAGNOSIS — N186 End stage renal disease: Secondary | ICD-10-CM | POA: Diagnosis not present

## 2015-12-11 DIAGNOSIS — D631 Anemia in chronic kidney disease: Secondary | ICD-10-CM | POA: Diagnosis not present

## 2015-12-11 DIAGNOSIS — E119 Type 2 diabetes mellitus without complications: Secondary | ICD-10-CM | POA: Diagnosis not present

## 2015-12-12 ENCOUNTER — Telehealth: Payer: Self-pay

## 2015-12-12 NOTE — Telephone Encounter (Signed)
Pharmacy is asking if it is okay to switch pt to [employer] Brand levothyroxine. The brand she has been on is no longer available.

## 2015-12-12 NOTE — Telephone Encounter (Signed)
Yes We should check her thyroid at her next visit

## 2015-12-12 NOTE — Telephone Encounter (Signed)
Faxed request back to pharmacy

## 2015-12-13 DIAGNOSIS — D631 Anemia in chronic kidney disease: Secondary | ICD-10-CM | POA: Diagnosis not present

## 2015-12-13 DIAGNOSIS — N186 End stage renal disease: Secondary | ICD-10-CM | POA: Diagnosis not present

## 2015-12-13 DIAGNOSIS — E119 Type 2 diabetes mellitus without complications: Secondary | ICD-10-CM | POA: Diagnosis not present

## 2015-12-16 DIAGNOSIS — N186 End stage renal disease: Secondary | ICD-10-CM | POA: Diagnosis not present

## 2015-12-16 DIAGNOSIS — D631 Anemia in chronic kidney disease: Secondary | ICD-10-CM | POA: Diagnosis not present

## 2015-12-16 DIAGNOSIS — E119 Type 2 diabetes mellitus without complications: Secondary | ICD-10-CM | POA: Diagnosis not present

## 2015-12-18 DIAGNOSIS — D631 Anemia in chronic kidney disease: Secondary | ICD-10-CM | POA: Diagnosis not present

## 2015-12-18 DIAGNOSIS — N186 End stage renal disease: Secondary | ICD-10-CM | POA: Diagnosis not present

## 2015-12-18 DIAGNOSIS — E119 Type 2 diabetes mellitus without complications: Secondary | ICD-10-CM | POA: Diagnosis not present

## 2015-12-20 ENCOUNTER — Encounter: Payer: Medicare Other | Admitting: Internal Medicine

## 2015-12-20 DIAGNOSIS — E119 Type 2 diabetes mellitus without complications: Secondary | ICD-10-CM | POA: Diagnosis not present

## 2015-12-20 DIAGNOSIS — N186 End stage renal disease: Secondary | ICD-10-CM | POA: Diagnosis not present

## 2015-12-20 DIAGNOSIS — D631 Anemia in chronic kidney disease: Secondary | ICD-10-CM | POA: Diagnosis not present

## 2015-12-23 DIAGNOSIS — N186 End stage renal disease: Secondary | ICD-10-CM | POA: Diagnosis not present

## 2015-12-23 DIAGNOSIS — D631 Anemia in chronic kidney disease: Secondary | ICD-10-CM | POA: Diagnosis not present

## 2015-12-23 DIAGNOSIS — E119 Type 2 diabetes mellitus without complications: Secondary | ICD-10-CM | POA: Diagnosis not present

## 2015-12-25 DIAGNOSIS — N186 End stage renal disease: Secondary | ICD-10-CM | POA: Diagnosis not present

## 2015-12-25 DIAGNOSIS — D631 Anemia in chronic kidney disease: Secondary | ICD-10-CM | POA: Diagnosis not present

## 2015-12-25 DIAGNOSIS — E119 Type 2 diabetes mellitus without complications: Secondary | ICD-10-CM | POA: Diagnosis not present

## 2015-12-27 DIAGNOSIS — N186 End stage renal disease: Secondary | ICD-10-CM | POA: Diagnosis not present

## 2015-12-27 DIAGNOSIS — D631 Anemia in chronic kidney disease: Secondary | ICD-10-CM | POA: Diagnosis not present

## 2015-12-27 DIAGNOSIS — E119 Type 2 diabetes mellitus without complications: Secondary | ICD-10-CM | POA: Diagnosis not present

## 2015-12-29 DIAGNOSIS — D631 Anemia in chronic kidney disease: Secondary | ICD-10-CM | POA: Diagnosis not present

## 2015-12-29 DIAGNOSIS — E119 Type 2 diabetes mellitus without complications: Secondary | ICD-10-CM | POA: Diagnosis not present

## 2015-12-29 DIAGNOSIS — N186 End stage renal disease: Secondary | ICD-10-CM | POA: Diagnosis not present

## 2016-01-01 ENCOUNTER — Emergency Department
Admission: EM | Admit: 2016-01-01 | Discharge: 2016-01-01 | Disposition: A | Discharge status: Home / Self Care | Payer: Medicare Other | Attending: Emergency Medicine | Admitting: Emergency Medicine

## 2016-01-01 DIAGNOSIS — Z955 Presence of coronary angioplasty implant and graft: Secondary | ICD-10-CM | POA: Diagnosis not present

## 2016-01-01 DIAGNOSIS — Z859 Personal history of malignant neoplasm, unspecified: Secondary | ICD-10-CM | POA: Insufficient documentation

## 2016-01-01 DIAGNOSIS — Y733 Surgical instruments, materials and gastroenterology and urology devices (including sutures) associated with adverse incidents: Secondary | ICD-10-CM | POA: Insufficient documentation

## 2016-01-01 DIAGNOSIS — I110 Hypertensive heart disease with heart failure: Secondary | ICD-10-CM | POA: Diagnosis not present

## 2016-01-01 DIAGNOSIS — Z87891 Personal history of nicotine dependence: Secondary | ICD-10-CM | POA: Diagnosis not present

## 2016-01-01 DIAGNOSIS — T82838A Hemorrhage of vascular prosthetic devices, implants and grafts, initial encounter: Secondary | ICD-10-CM | POA: Diagnosis not present

## 2016-01-01 DIAGNOSIS — Z7982 Long term (current) use of aspirin: Secondary | ICD-10-CM | POA: Insufficient documentation

## 2016-01-01 DIAGNOSIS — D631 Anemia in chronic kidney disease: Secondary | ICD-10-CM | POA: Diagnosis not present

## 2016-01-01 DIAGNOSIS — N186 End stage renal disease: Secondary | ICD-10-CM | POA: Diagnosis not present

## 2016-01-01 DIAGNOSIS — I509 Heart failure, unspecified: Secondary | ICD-10-CM | POA: Diagnosis not present

## 2016-01-01 DIAGNOSIS — Z79899 Other long term (current) drug therapy: Secondary | ICD-10-CM | POA: Diagnosis not present

## 2016-01-01 DIAGNOSIS — I2510 Atherosclerotic heart disease of native coronary artery without angina pectoris: Secondary | ICD-10-CM | POA: Insufficient documentation

## 2016-01-01 DIAGNOSIS — E119 Type 2 diabetes mellitus without complications: Secondary | ICD-10-CM | POA: Diagnosis not present

## 2016-01-01 DIAGNOSIS — R58 Hemorrhage, not elsewhere classified: Secondary | ICD-10-CM | POA: Diagnosis not present

## 2016-01-01 MED ORDER — LIDOCAINE HCL (PF) 1 % IJ SOLN
INTRAMUSCULAR | Status: AC
  Filled 2016-01-01: qty 5

## 2016-01-01 MED ORDER — METOPROLOL TARTRATE 50 MG PO TABS
50.0000 mg | ORAL_TABLET | Freq: Once | ORAL | Status: AC
  Administered 2016-01-01: 50 mg via ORAL

## 2016-01-01 MED ORDER — CLONIDINE HCL 0.1 MG PO TABS
0.1000 mg | ORAL_TABLET | Freq: Once | ORAL | Status: AC
  Administered 2016-01-01: 0.1 mg via ORAL

## 2016-01-01 MED ORDER — METOPROLOL TARTRATE 50 MG PO TABS
ORAL_TABLET | ORAL | Status: AC
  Administered 2016-01-01: 50 mg via ORAL
  Filled 2016-01-01: qty 1

## 2016-01-01 MED ORDER — HYDRALAZINE HCL 50 MG PO TABS
25.0000 mg | ORAL_TABLET | Freq: Once | ORAL | Status: AC
  Administered 2016-01-01: 25 mg via ORAL

## 2016-01-01 MED ORDER — HYDRALAZINE HCL 50 MG PO TABS
ORAL_TABLET | ORAL | Status: AC
  Administered 2016-01-01: 25 mg via ORAL
  Filled 2016-01-01: qty 1

## 2016-01-01 MED ORDER — CLONIDINE HCL 0.1 MG PO TABS
ORAL_TABLET | ORAL | Status: AC
  Administered 2016-01-01: 0.1 mg via ORAL
  Filled 2016-01-01: qty 1

## 2016-01-01 NOTE — ED Provider Notes (Signed)
[facility] Emergency Department Provider Note  Time seen: 5:38 PM  I have reviewed the triage vital signs and the nursing notes.   HISTORY  Chief Complaint Vascular Access Problem    HPI [name] is a 82 y.o. female with end-stage renal disease who presents to the emergency department status post dialysis with left upper extremity fistula bleeding. According to the patient the bleeding appeared to stop at her dialysis however once getting home she said blood was "flying" out of her arm. Patient called EMS who wrapped the fistula and brought her to the emergency department for evaluation. Patient denies any pain. Patient's blood pressure currently [id number] systolic patient is on multiple high blood pressure medications. Patient was able to receive her full dialysis session today.  Past Medical History:  Diagnosis Date  . Anemia    NOS  . Anxiety   . Aortic stenosis   . Cancer [facility])    Mole on top of head to be removed in July 2013  . CHF (congestive heart failure) ([place])   . Constipation   . Coronary artery disease   . Diabetes mellitus    type II;was on Glipizide but has been off x 91mon  . Dialysis patient ([place])   . Diarrhea   . GERD (gastroesophageal reflux disease)   . History of blood transfusion   . Hyperlipidemia    takes Simvastatin nightly  . Hyperlipidemia   . Hypertension    takes Metoprolol daily  . Hypothyroidism    takes Synthroid daily  . Migraine    hx of  . Oligouria   . Osteoarthritis   . Osteopenia   . Peripheral vascular disease ([place])   . Personal history of colonic polyps   . Pulmonary hypertension   . Renal insufficiency   . Urinary incontinence   . UTI (urinary tract infection)     Patient Active Problem List   Diagnosis Date Noted  . Cerebrovascular accident (CVA) due to occlusion of right middle cerebral artery ([place]) 10/24/2015  . Malnutrition of mild degree ([place]) 10/24/2015  . Encephalopathy 09/18/2015  . Altered  mental status 09/18/2015  . Preventative health care 05/03/2014  . Peripheral vascular disease ([place]) 12/21/2013  . Toe amputation status ([place]) 08/29/2013  . History of recent fall 04/18/2013  . HTN (hypertension), malignant 03/22/2013  . S/P aortic valve replacement with bioprosthetic valve 11/15/2012  . S/P CABG x 3 11/15/2012  . Anemia of chronic kidney failure 12/22/2010  . Thrombocytopenia ([place]) 12/13/2010  . End stage renal disease on dialysis ([place]) 12/13/2010  . S/P aortic valve replacement 08/11/2010  . Arterial insufficiency-lower 07/25/2010  . Constipation, other cause 06/23/2010  . NEURODERMATITIS 04/08/2009  . Carotid arterial disease ([place]) 01/01/2009  . Coronary atherosclerosis 02/11/2007  . AORTIC STENOSIS 10/04/2006  . ANXIETY 05/30/2006  . Pulmonary hypertension 05/30/2006  . Hypothyroidism 05/27/2006  . Type 2 diabetes mellitus with renal manifestations, controlled ([place]) 05/27/2006  . Hyperlipidemia 05/27/2006  . Essential hypertension 05/27/2006  . GERD 05/27/2006  . OSTEOARTHRITIS 05/27/2006  . OSTEOPENIA 05/27/2006  . URINARY INCONTINENCE 05/27/2006    Past Surgical History:  Procedure Laterality Date  . ABDOMINAL HYSTERECTOMY    . adenosine myoview  2007   benign, EF 69%  . AMPUTATION  06/18/2011   Procedure: AMPUTATION DIGIT;  Surgeon: Newt Minion, MD;  Location: [place];  Service: Orthopedics;  Laterality: Left;  Left 2nd Toe Amputation at MTP Joint  . Amputation-left great toe  7/12  Dr Marlou Sa  . AORTIC VALVE REPLACEMENT  2009  . APPENDECTOMY    . AV FISTULA PLACEMENT  12/22/2010   Procedure: ARTERIOVENOUS (AV) FISTULA CREATION;  Surgeon: Hinda Lenis, MD;  Location: [place];  Service: Vascular;  Laterality: Left;  Creation of Left Brachial-Cephalic Fistula  . CARDIAC CATHETERIZATION  2000   cad  . CAROTID ENDARTERECTOMY  2011   Right  . CATARACT EXTRACTION    . CORONARY ARTERY BYPASS GRAFT     od  . CORONARY ARTERY BYPASS GRAFT  2009  .  EYE SURGERY     BIlateral  . INSERTION OF DIALYSIS CATHETER  12/18/2010   Procedure: INSERTION OF DIALYSIS CATHETER;  Surgeon: Mal Misty, MD;  Location: [place];  Service: Vascular;  Laterality: Right;  . REPLACEMENT TOTAL KNEE BILATERAL  05/1998  . TONSILLECTOMY    . Traumatic Amputation of Right DIP joint of Index Finger      Prior to Admission medications   Medication Sig Start Date End Date Taking? Authorizing Provider  amLODipine (NORVASC) 5 MG tablet Take 1 tablet (5 mg total) by mouth daily. 11/12/15   [name], MD  aspirin EC 81 MG tablet Take 81 mg by mouth at bedtime.    Historical Provider, MD  calcium carbonate (TUMS - DOSED IN MG ELEMENTAL CALCIUM) 500 MG chewable tablet Chew 1 tablet (200 mg of elemental calcium total) by mouth 3 (three) times daily with meals. 12/06/12   Geradine Girt, DO  cloNIDine (CATAPRES) 0.1 MG tablet Take 1 tablet (0.1 mg total) by mouth 2 (two) times daily. 09/22/15   Fritzi Mandes, MD  clopidogrel (PLAVIX) 75 MG tablet Take 1 tablet (75 mg total) by mouth daily. 09/23/15   Fritzi Mandes, MD  folic acid (FOLVITE) 0.5 MG tablet Take 0.5 tablets (0.5 mg total) by mouth daily. 12/06/12   Geradine Girt, DO  hydrALAZINE (APRESOLINE) 25 MG tablet Take 1 tablet (25 mg total) by mouth every 8 (eight) hours. 09/22/15   Fritzi Mandes, MD  levothyroxine (SYNTHROID, LEVOTHROID) 75 MCG tablet TAKE ONE TABLET BY MOUTH ONCE DAILY 07/04/15   [name], MD  metoprolol (LOPRESSOR) 50 MG tablet Take 1 tablet (50 mg total) by mouth 2 (two) times daily. 11/25/15   Minna Merritts, MD  polyvinyl alcohol (LIQUIFILM TEARS) 1.4 % ophthalmic solution Place 1 drop into both eyes as needed for dry eyes. 09/22/15   Fritzi Mandes, MD  pravastatin (PRAVACHOL) 40 MG tablet Take 1 tablet (40 mg total) by mouth every evening. 05/07/15   Minna Merritts, MD  vitamin C (VITAMIN C) 500 MG tablet Take 1 tablet (500 mg total) by mouth daily. 12/06/12   Geradine Girt, DO    Allergies   Allergen Reactions  . Nitrofurantoin Itching  . Captopril Other (See Comments)    REACTION: unspecified  . Enalapril Maleate Cough  . Ramipril Other (See Comments)    REACTION: unspecified  . Sulfa Antibiotics Other (See Comments)    Reaction unknown  . Verapamil Other (See Comments)    REACTION: unspecified    Family History  Problem Relation Age of Onset  . Family history unknown: Yes    Social History Social History  Substance Use Topics  . Smoking status: Former Smoker    Years: 15.00    Types: Cigarettes    Quit date: 01/06/1943  . Smokeless tobacco: Never Used  . Alcohol use Yes     Comment: Wine occasionally  Review of Systems Constitutional: Negative for fever. Cardiovascular: Negative for chest pain. Respiratory: Negative for shortness of breath. Gastrointestinal: Negative for abdominal pain Musculoskeletal: Negative for back pain. Left upper extremity fistula bleeding. Neurological: Negative for headaches, focal weakness or numbness. 10-point ROS otherwise negative.  ____________________________________________   PHYSICAL EXAM:  VITAL SIGNS: ED Triage Vitals  Enc Vitals Group     BP 01/01/16 1724 (!) 218/72     Pulse --      Resp 01/01/16 1724 19     Temp 01/01/16 1724 98.2 F (36.8 C)     Temp Source 01/01/16 1724 Oral     SpO2 01/01/16 1724 97 %     Weight 01/01/16 1725 117 lb 4 oz (53.2 kg)     Height 01/01/16 1725 5\' 3"  (1.6 m)     Head Circumference --      Peak Flow --      Pain Score --      Pain Loc --      Pain Edu? --      Excl. in [place]? --     Constitutional: Alert and oriented. Well appearing and in no distress. Eyes: Normal exam ENT   Head: Normocephalic and atraumatic.   Mouth/Throat: Mucous membranes are moist. Cardiovascular: Normal rate, regular rhythm.  Respiratory: Normal respiratory effort without tachypnea nor retractions. Breath sounds are clear  Gastrointestinal: Soft and nontender.  Musculoskeletal:  Patient with left upper extremity fistula with good thrill. Patient has a pulsatile bleed from the fistula. A clamp has been placed and hemostasis achieved. Neurologic:  Normal speech and language. No gross focal neurologic deficits Skin:  Skin is warm, dry and intact.  Psychiatric: Mood and affect are normal.   ____________________________________________    INITIAL IMPRESSION / ASSESSMENT AND PLAN / ED COURSE  Pertinent labs & imaging results that were available during my care of the patient were reviewed by me and considered in my medical decision making (see chart for details).  The patient presents the emergency department the bleeding left upper extremity fistula after her dialysis session. Currently with mild to moderate pulsatile bleed however disease the controlled with a dialysis clamp. Patient's blood pressure [id number] systolic currently. We will dose the patient's evening blood pressure medications, maintain a clamp in place until a blood pressure has decreased.  Local lidocaine infused around the site of bleeding with a great reduction in the amount of bleeding. Skin is too thin to suture. Dermabond applied. Patient has now been hemostatic grater than 30 minutes. We will discharge home   ____________________________________________   FINAL CLINICAL IMPRESSION(S) / ED DIAGNOSES  Bleeding left upper extremity fistula    Harvest Dark, MD 01/01/16 [phone]

## 2016-01-01 NOTE — ED Triage Notes (Signed)
Per EMS, pt had dialysis, thought bleeding from fistula was under control however when pt got home the blood began "spurting" again. Currently site is wrapped in gauze with blood noted to gauze. EMS reports pt has hx of HTN and currently 218/72, HR 80. Pt A&O, denies CP or dizziness.

## 2016-01-03 DIAGNOSIS — D631 Anemia in chronic kidney disease: Secondary | ICD-10-CM | POA: Diagnosis not present

## 2016-01-03 DIAGNOSIS — N186 End stage renal disease: Secondary | ICD-10-CM | POA: Diagnosis not present

## 2016-01-03 DIAGNOSIS — E119 Type 2 diabetes mellitus without complications: Secondary | ICD-10-CM | POA: Diagnosis not present

## 2016-01-05 DIAGNOSIS — D631 Anemia in chronic kidney disease: Secondary | ICD-10-CM | POA: Diagnosis not present

## 2016-01-05 DIAGNOSIS — N186 End stage renal disease: Secondary | ICD-10-CM | POA: Diagnosis not present

## 2016-01-05 DIAGNOSIS — Z992 Dependence on renal dialysis: Secondary | ICD-10-CM | POA: Diagnosis not present

## 2016-01-05 DIAGNOSIS — E119 Type 2 diabetes mellitus without complications: Secondary | ICD-10-CM | POA: Diagnosis not present

## 2016-01-05 DIAGNOSIS — I120 Hypertensive chronic kidney disease with stage 5 chronic kidney disease or end stage renal disease: Secondary | ICD-10-CM | POA: Diagnosis not present

## 2016-01-08 DIAGNOSIS — N2581 Secondary hyperparathyroidism of renal origin: Secondary | ICD-10-CM | POA: Diagnosis not present

## 2016-01-08 DIAGNOSIS — N186 End stage renal disease: Secondary | ICD-10-CM | POA: Diagnosis not present

## 2016-01-08 DIAGNOSIS — D631 Anemia in chronic kidney disease: Secondary | ICD-10-CM | POA: Diagnosis not present

## 2016-01-08 DIAGNOSIS — D509 Iron deficiency anemia, unspecified: Secondary | ICD-10-CM | POA: Diagnosis not present

## 2016-01-11 DIAGNOSIS — D509 Iron deficiency anemia, unspecified: Secondary | ICD-10-CM | POA: Diagnosis not present

## 2016-01-11 DIAGNOSIS — D631 Anemia in chronic kidney disease: Secondary | ICD-10-CM | POA: Diagnosis not present

## 2016-01-11 DIAGNOSIS — N186 End stage renal disease: Secondary | ICD-10-CM | POA: Diagnosis not present

## 2016-01-11 DIAGNOSIS — N2581 Secondary hyperparathyroidism of renal origin: Secondary | ICD-10-CM | POA: Diagnosis not present

## 2016-01-13 ENCOUNTER — Other Ambulatory Visit: Payer: Self-pay | Admitting: *Deleted

## 2016-01-13 DIAGNOSIS — N2581 Secondary hyperparathyroidism of renal origin: Secondary | ICD-10-CM | POA: Diagnosis not present

## 2016-01-13 DIAGNOSIS — D509 Iron deficiency anemia, unspecified: Secondary | ICD-10-CM | POA: Diagnosis not present

## 2016-01-13 DIAGNOSIS — N186 End stage renal disease: Secondary | ICD-10-CM | POA: Diagnosis not present

## 2016-01-13 DIAGNOSIS — D631 Anemia in chronic kidney disease: Secondary | ICD-10-CM | POA: Diagnosis not present

## 2016-01-13 MED ORDER — HYDRALAZINE HCL 25 MG PO TABS: 25.0000 mg | ORAL_TABLET | Freq: Three times a day (TID) | ORAL | 1 refills | Status: DC

## 2016-01-13 NOTE — Telephone Encounter (Signed)
Patient's son [name] left a message requesting a refill on Hydralazine. Mr. Albertelli stated that this was prescribed by Dr. Posey Pronto so this will be a new script by Dr. Silvio Pate.  Patient's son stated that patient is almost out of her medication.

## 2016-01-15 DIAGNOSIS — N186 End stage renal disease: Secondary | ICD-10-CM | POA: Diagnosis not present

## 2016-01-15 DIAGNOSIS — D509 Iron deficiency anemia, unspecified: Secondary | ICD-10-CM | POA: Diagnosis not present

## 2016-01-15 DIAGNOSIS — N2581 Secondary hyperparathyroidism of renal origin: Secondary | ICD-10-CM | POA: Diagnosis not present

## 2016-01-15 DIAGNOSIS — D631 Anemia in chronic kidney disease: Secondary | ICD-10-CM | POA: Diagnosis not present

## 2016-01-16 DIAGNOSIS — I871 Compression of vein: Secondary | ICD-10-CM | POA: Diagnosis not present

## 2016-01-16 DIAGNOSIS — T82858A Stenosis of vascular prosthetic devices, implants and grafts, initial encounter: Secondary | ICD-10-CM | POA: Diagnosis not present

## 2016-01-16 DIAGNOSIS — N186 End stage renal disease: Secondary | ICD-10-CM | POA: Diagnosis not present

## 2016-01-16 DIAGNOSIS — Z992 Dependence on renal dialysis: Secondary | ICD-10-CM | POA: Diagnosis not present

## 2016-01-17 DIAGNOSIS — N2581 Secondary hyperparathyroidism of renal origin: Secondary | ICD-10-CM | POA: Diagnosis not present

## 2016-01-17 DIAGNOSIS — D509 Iron deficiency anemia, unspecified: Secondary | ICD-10-CM | POA: Diagnosis not present

## 2016-01-17 DIAGNOSIS — D631 Anemia in chronic kidney disease: Secondary | ICD-10-CM | POA: Diagnosis not present

## 2016-01-17 DIAGNOSIS — N186 End stage renal disease: Secondary | ICD-10-CM | POA: Diagnosis not present

## 2016-01-20 ENCOUNTER — Other Ambulatory Visit: Payer: Self-pay | Admitting: *Deleted

## 2016-01-20 DIAGNOSIS — D631 Anemia in chronic kidney disease: Secondary | ICD-10-CM | POA: Diagnosis not present

## 2016-01-20 DIAGNOSIS — D509 Iron deficiency anemia, unspecified: Secondary | ICD-10-CM | POA: Diagnosis not present

## 2016-01-20 DIAGNOSIS — N186 End stage renal disease: Secondary | ICD-10-CM | POA: Diagnosis not present

## 2016-01-20 DIAGNOSIS — N2581 Secondary hyperparathyroidism of renal origin: Secondary | ICD-10-CM | POA: Diagnosis not present

## 2016-01-20 MED ORDER — CLOPIDOGREL BISULFATE 75 MG PO TABS: 75.0000 mg | ORAL_TABLET | Freq: Every day | ORAL | 11 refills | Status: DC

## 2016-01-20 NOTE — Telephone Encounter (Signed)
Approved: okay for 1 year 

## 2016-01-20 NOTE — Telephone Encounter (Signed)
Patient's son [name] left a voicemail stating that there is another medication that was prescribed by Dr. Posey Pronto that needs to be sent to [facility]  Mr. Blaes stated that a new script for Plavix needs to be sent in by Dr. Silvio Pate.

## 2016-01-23 DIAGNOSIS — N186 End stage renal disease: Secondary | ICD-10-CM | POA: Diagnosis not present

## 2016-01-23 DIAGNOSIS — D631 Anemia in chronic kidney disease: Secondary | ICD-10-CM | POA: Diagnosis not present

## 2016-01-23 DIAGNOSIS — D509 Iron deficiency anemia, unspecified: Secondary | ICD-10-CM | POA: Diagnosis not present

## 2016-01-23 DIAGNOSIS — N2581 Secondary hyperparathyroidism of renal origin: Secondary | ICD-10-CM | POA: Diagnosis not present

## 2016-01-25 DIAGNOSIS — N186 End stage renal disease: Secondary | ICD-10-CM | POA: Diagnosis not present

## 2016-01-25 DIAGNOSIS — D631 Anemia in chronic kidney disease: Secondary | ICD-10-CM | POA: Diagnosis not present

## 2016-01-25 DIAGNOSIS — D509 Iron deficiency anemia, unspecified: Secondary | ICD-10-CM | POA: Diagnosis not present

## 2016-01-25 DIAGNOSIS — N2581 Secondary hyperparathyroidism of renal origin: Secondary | ICD-10-CM | POA: Diagnosis not present

## 2016-01-27 DIAGNOSIS — D631 Anemia in chronic kidney disease: Secondary | ICD-10-CM | POA: Diagnosis not present

## 2016-01-27 DIAGNOSIS — D509 Iron deficiency anemia, unspecified: Secondary | ICD-10-CM | POA: Diagnosis not present

## 2016-01-27 DIAGNOSIS — N2581 Secondary hyperparathyroidism of renal origin: Secondary | ICD-10-CM | POA: Diagnosis not present

## 2016-01-27 DIAGNOSIS — N186 End stage renal disease: Secondary | ICD-10-CM | POA: Diagnosis not present

## 2016-01-29 DIAGNOSIS — D631 Anemia in chronic kidney disease: Secondary | ICD-10-CM | POA: Diagnosis not present

## 2016-01-29 DIAGNOSIS — D509 Iron deficiency anemia, unspecified: Secondary | ICD-10-CM | POA: Diagnosis not present

## 2016-01-29 DIAGNOSIS — N186 End stage renal disease: Secondary | ICD-10-CM | POA: Diagnosis not present

## 2016-01-29 DIAGNOSIS — N2581 Secondary hyperparathyroidism of renal origin: Secondary | ICD-10-CM | POA: Diagnosis not present

## 2016-01-31 DIAGNOSIS — N2581 Secondary hyperparathyroidism of renal origin: Secondary | ICD-10-CM | POA: Diagnosis not present

## 2016-01-31 DIAGNOSIS — D631 Anemia in chronic kidney disease: Secondary | ICD-10-CM | POA: Diagnosis not present

## 2016-01-31 DIAGNOSIS — N186 End stage renal disease: Secondary | ICD-10-CM | POA: Diagnosis not present

## 2016-01-31 DIAGNOSIS — D509 Iron deficiency anemia, unspecified: Secondary | ICD-10-CM | POA: Diagnosis not present

## 2016-02-03 DIAGNOSIS — D631 Anemia in chronic kidney disease: Secondary | ICD-10-CM | POA: Diagnosis not present

## 2016-02-03 DIAGNOSIS — D509 Iron deficiency anemia, unspecified: Secondary | ICD-10-CM | POA: Diagnosis not present

## 2016-02-03 DIAGNOSIS — N2581 Secondary hyperparathyroidism of renal origin: Secondary | ICD-10-CM | POA: Diagnosis not present

## 2016-02-03 DIAGNOSIS — N186 End stage renal disease: Secondary | ICD-10-CM | POA: Diagnosis not present

## 2016-02-05 DIAGNOSIS — N186 End stage renal disease: Secondary | ICD-10-CM | POA: Diagnosis not present

## 2016-02-05 DIAGNOSIS — Z992 Dependence on renal dialysis: Secondary | ICD-10-CM | POA: Diagnosis not present

## 2016-02-05 DIAGNOSIS — D631 Anemia in chronic kidney disease: Secondary | ICD-10-CM | POA: Diagnosis not present

## 2016-02-05 DIAGNOSIS — I120 Hypertensive chronic kidney disease with stage 5 chronic kidney disease or end stage renal disease: Secondary | ICD-10-CM | POA: Diagnosis not present

## 2016-02-05 DIAGNOSIS — D509 Iron deficiency anemia, unspecified: Secondary | ICD-10-CM | POA: Diagnosis not present

## 2016-02-05 DIAGNOSIS — N2581 Secondary hyperparathyroidism of renal origin: Secondary | ICD-10-CM | POA: Diagnosis not present

## 2016-02-07 DIAGNOSIS — D631 Anemia in chronic kidney disease: Secondary | ICD-10-CM | POA: Diagnosis not present

## 2016-02-07 DIAGNOSIS — N2581 Secondary hyperparathyroidism of renal origin: Secondary | ICD-10-CM | POA: Diagnosis not present

## 2016-02-07 DIAGNOSIS — D509 Iron deficiency anemia, unspecified: Secondary | ICD-10-CM | POA: Diagnosis not present

## 2016-02-07 DIAGNOSIS — N186 End stage renal disease: Secondary | ICD-10-CM | POA: Diagnosis not present

## 2016-02-10 DIAGNOSIS — D509 Iron deficiency anemia, unspecified: Secondary | ICD-10-CM | POA: Diagnosis not present

## 2016-02-10 DIAGNOSIS — N186 End stage renal disease: Secondary | ICD-10-CM | POA: Diagnosis not present

## 2016-02-10 DIAGNOSIS — D631 Anemia in chronic kidney disease: Secondary | ICD-10-CM | POA: Diagnosis not present

## 2016-02-10 DIAGNOSIS — N2581 Secondary hyperparathyroidism of renal origin: Secondary | ICD-10-CM | POA: Diagnosis not present

## 2016-02-12 DIAGNOSIS — N2581 Secondary hyperparathyroidism of renal origin: Secondary | ICD-10-CM | POA: Diagnosis not present

## 2016-02-12 DIAGNOSIS — D631 Anemia in chronic kidney disease: Secondary | ICD-10-CM | POA: Diagnosis not present

## 2016-02-12 DIAGNOSIS — D509 Iron deficiency anemia, unspecified: Secondary | ICD-10-CM | POA: Diagnosis not present

## 2016-02-12 DIAGNOSIS — N186 End stage renal disease: Secondary | ICD-10-CM | POA: Diagnosis not present

## 2016-02-13 ENCOUNTER — Ambulatory Visit (INDEPENDENT_AMBULATORY_CARE_PROVIDER_SITE_OTHER): Payer: Medicare Other | Admitting: Internal Medicine

## 2016-02-13 ENCOUNTER — Telehealth: Payer: Self-pay | Admitting: Radiology

## 2016-02-13 ENCOUNTER — Encounter: Payer: Self-pay | Admitting: Internal Medicine

## 2016-02-13 VITALS — BP 136/66 | HR 78 | Temp 97.9°F | Ht 61.0 in | Wt 111.2 lb

## 2016-02-13 DIAGNOSIS — E1121 Type 2 diabetes mellitus with diabetic nephropathy: Secondary | ICD-10-CM | POA: Diagnosis not present

## 2016-02-13 DIAGNOSIS — N186 End stage renal disease: Secondary | ICD-10-CM

## 2016-02-13 DIAGNOSIS — Z23 Encounter for immunization: Secondary | ICD-10-CM | POA: Diagnosis not present

## 2016-02-13 DIAGNOSIS — Z992 Dependence on renal dialysis: Secondary | ICD-10-CM

## 2016-02-13 DIAGNOSIS — D696 Thrombocytopenia, unspecified: Secondary | ICD-10-CM

## 2016-02-13 DIAGNOSIS — S98132A Complete traumatic amputation of one left lesser toe, initial encounter: Secondary | ICD-10-CM

## 2016-02-13 DIAGNOSIS — I739 Peripheral vascular disease, unspecified: Secondary | ICD-10-CM

## 2016-02-13 DIAGNOSIS — E441 Mild protein-calorie malnutrition: Secondary | ICD-10-CM

## 2016-02-13 DIAGNOSIS — I779 Disorder of arteries and arterioles, unspecified: Secondary | ICD-10-CM | POA: Diagnosis not present

## 2016-02-13 DIAGNOSIS — Z0000 Encounter for general adult medical examination without abnormal findings: Secondary | ICD-10-CM

## 2016-02-13 DIAGNOSIS — Z89422 Acquired absence of other left toe(s): Secondary | ICD-10-CM

## 2016-02-13 LAB — CBC WITH DIFFERENTIAL/PLATELET
Basophils Absolute: 0.1 10*3/uL (ref 0.0–0.1)
Basophils Relative: 0.6 % (ref 0.0–3.0)
EOS PCT: 2.9 % (ref 0.0–5.0)
Eosinophils Absolute: 0.3 10*3/uL (ref 0.0–0.7)
HCT: 31.1 % — ABNORMAL LOW (ref 36.0–46.0)
Hemoglobin: 10.1 g/dL — ABNORMAL LOW (ref 12.0–15.0)
LYMPHS ABS: 1 10*3/uL (ref 0.7–4.0)
Lymphocytes Relative: 8.3 % — ABNORMAL LOW (ref 12.0–46.0)
MCHC: 32.3 g/dL (ref 30.0–36.0)
MCV: 102.7 fl — AB (ref 78.0–100.0)
MONO ABS: 1.5 10*3/uL — AB (ref 0.1–1.0)
MONOS PCT: 12.1 % — AB (ref 3.0–12.0)
NEUTROS ABS: 9.1 10*3/uL — AB (ref 1.4–7.7)
NEUTROS PCT: 76.1 % (ref 43.0–77.0)
PLATELETS: 275 10*3/uL (ref 150.0–400.0)
RBC: 3.03 Mil/uL — AB (ref 3.87–5.11)
RDW: 16.5 % — AB (ref 11.5–15.5)
WBC: 12 10*3/uL — ABNORMAL HIGH (ref 4.0–10.5)

## 2016-02-13 LAB — LIPID PANEL
Cholesterol: 148 mg/dL (ref 0–200)
HDL: 52.4 mg/dL (ref >39.00)
LDL Cholesterol: 78 mg/dL (ref 0–99)
NONHDL: 95.65
TRIGLYCERIDES: 89 mg/dL (ref 0.0–149.0)
Total CHOL/HDL Ratio: 3
VLDL: 17.8 mg/dL (ref 0.0–40.0)

## 2016-02-13 LAB — COMPREHENSIVE METABOLIC PANEL
ALT: 12 U/L (ref 0–35)
AST: 19 U/L (ref 0–37)
Albumin: 4.3 g/dL (ref 3.5–5.2)
Alkaline Phosphatase: 182 U/L — ABNORMAL HIGH (ref 39–117)
BILIRUBIN TOTAL: 0.5 mg/dL (ref 0.2–1.2)
BUN: 39 mg/dL — ABNORMAL HIGH (ref 6–23)
CHLORIDE: 95 meq/L — AB (ref 96–112)
CO2: 31 meq/L (ref 19–32)
Calcium: 9.9 mg/dL (ref 8.4–10.5)
Creatinine, Ser: 3.91 mg/dL — ABNORMAL HIGH (ref 0.40–1.20)
GFR: 11.37 mL/min — AB (ref >60.00)
Glucose, Bld: 99 mg/dL (ref 70–99)
POTASSIUM: 5.3 meq/L — AB (ref 3.5–5.1)
Sodium: 135 mEq/L (ref 135–145)
Total Protein: 8.1 g/dL (ref 6.0–8.3)

## 2016-02-13 LAB — HEMOGLOBIN A1C: HEMOGLOBIN A1C: 5.9 % (ref 4.6–6.5)

## 2016-02-13 LAB — T4, FREE: FREE T4: 1 ng/dL (ref 0.60–1.60)

## 2016-02-13 LAB — HM DIABETES FOOT EXAM

## 2016-02-13 MED ORDER — CLONIDINE HCL 0.1 MG PO TABS: 0.1000 mg | ORAL_TABLET | Freq: Two times a day (BID) | ORAL | 11 refills | Status: DC

## 2016-02-13 NOTE — Assessment & Plan Note (Signed)
Chronic easy bruising Will check labs

## 2016-02-13 NOTE — Assessment & Plan Note (Signed)
No pain in feet now and perfusion seems adequate

## 2016-02-13 NOTE — Assessment & Plan Note (Signed)
Left great toe site clean

## 2016-02-13 NOTE — Assessment & Plan Note (Signed)
Not a big eating but ~holding her own

## 2016-02-13 NOTE — Telephone Encounter (Signed)
[facility] lab called critical results, GFR 11.37. Results given to Dr Diona Browner

## 2016-02-13 NOTE — Assessment & Plan Note (Signed)
No TIA symptoms On ASA and statin

## 2016-02-13 NOTE — Progress Notes (Signed)
Subjective:    Patient ID: [name], female    DOB: 02-Feb-1920, 83 y.o.   MRN: [id number]  HPI Here for Medicare wellness and follow up of chronic health conditions Reviewed form and advanced directives Reviewed other doctors No alcohol or tobacco Doesn't specifically exercise---other than twice a week at [place] is fairly good--- right eye is poor Hearing is very poor---getting new aides Frequent falls--no injuries No depression or anhedonia No apparent memory problems  Still has some balance problems in the morning---uses walker Then cane after that No distinct left side weakness  She still lives alone---son with her on weekends She drives to [facility] Son helps her shop--mostly prepared foods. Son helps her pay bills Showers only when someone is around Independent with instrumental ADLs  Continues on 3x/week dialysis Recent surgery on her shunt--now working okay now  Doesn't check sugars--they do this at dialysis No sores, pain or numbness in feet  No chest pain No SOB No palpitations No edema  Current Outpatient Prescriptions on File Prior to Visit  Medication Sig Dispense Refill  . amLODipine (NORVASC) 5 MG tablet Take 1 tablet (5 mg total) by mouth daily. 90 tablet 3  . aspirin EC 81 MG tablet Take 81 mg by mouth at bedtime.    . calcium carbonate (TUMS - DOSED IN MG ELEMENTAL CALCIUM) 500 MG chewable tablet Chew 1 tablet (200 mg of elemental calcium total) by mouth 3 (three) times daily with meals.    . cloNIDine (CATAPRES) 0.1 MG tablet Take 1 tablet (0.1 mg total) by mouth 2 (two) times daily. 60 tablet 11  . clopidogrel (PLAVIX) 75 MG tablet Take 1 tablet (75 mg total) by mouth daily. 30 tablet 11  . folic acid (FOLVITE) 0.5 MG tablet Take 0.5 tablets (0.5 mg total) by mouth daily.    . hydrALAZINE (APRESOLINE) 25 MG tablet Take 1 tablet (25 mg total) by mouth every 8 (eight) hours. 90 tablet 1  . levothyroxine (SYNTHROID, LEVOTHROID) 75 MCG  tablet TAKE ONE TABLET BY MOUTH ONCE DAILY 30 tablet 11  . metoprolol (LOPRESSOR) 50 MG tablet Take 1 tablet (50 mg total) by mouth 2 (two) times daily. 180 tablet 3  . polyvinyl alcohol (LIQUIFILM TEARS) 1.4 % ophthalmic solution Place 1 drop into both eyes as needed for dry eyes. 15 mL 0  . pravastatin (PRAVACHOL) 40 MG tablet Take 1 tablet (40 mg total) by mouth every evening. 90 tablet 4  . vitamin C (VITAMIN C) 500 MG tablet Take 1 tablet (500 mg total) by mouth daily.     No current facility-administered medications on file prior to visit.     Allergies  Allergen Reactions  . Nitrofurantoin Itching  . Captopril Other (See Comments)    REACTION: unspecified  . Enalapril Maleate Cough  . Ramipril Other (See Comments)    REACTION: unspecified  . Sulfa Antibiotics Other (See Comments)    Reaction unknown  . Verapamil Other (See Comments)    REACTION: unspecified    Past Medical History:  Diagnosis Date  . Anemia    NOS  . Anxiety   . Aortic stenosis   . Cancer [facility])    Mole on top of head to be removed in July 2013  . CHF (congestive heart failure) ([place])   . Constipation   . Coronary artery disease   . Diabetes mellitus    type II;was on Glipizide but has been off x 61mon  . Dialysis patient ([place])   .  Diarrhea   . GERD (gastroesophageal reflux disease)   . History of blood transfusion   . Hyperlipidemia    takes Simvastatin nightly  . Hyperlipidemia   . Hypertension    takes Metoprolol daily  . Hypothyroidism    takes Synthroid daily  . Migraine    hx of  . Oligouria   . Osteoarthritis   . Osteopenia   . Peripheral vascular disease ([place])   . Personal history of colonic polyps   . Pulmonary hypertension   . Renal insufficiency   . Urinary incontinence   . UTI (urinary tract infection)     Past Surgical History:  Procedure Laterality Date  . ABDOMINAL HYSTERECTOMY    . adenosine myoview  2007   benign, EF 69%  . AMPUTATION  06/18/2011   Procedure:  AMPUTATION DIGIT;  Surgeon: Newt Minion, MD;  Location: [place];  Service: Orthopedics;  Laterality: Left;  Left 2nd Toe Amputation at MTP Joint  . Amputation-left great toe  7/12   Dr Marlou Sa  . AORTIC VALVE REPLACEMENT  2009  . APPENDECTOMY    . AV FISTULA PLACEMENT  12/22/2010   Procedure: ARTERIOVENOUS (AV) FISTULA CREATION;  Surgeon: Hinda Lenis, MD;  Location: [place];  Service: Vascular;  Laterality: Left;  Creation of Left Brachial-Cephalic Fistula  . CARDIAC CATHETERIZATION  2000   cad  . CAROTID ENDARTERECTOMY  2011   Right  . CATARACT EXTRACTION    . CORONARY ARTERY BYPASS GRAFT     od  . CORONARY ARTERY BYPASS GRAFT  2009  . EYE SURGERY     BIlateral  . INSERTION OF DIALYSIS CATHETER  12/18/2010   Procedure: INSERTION OF DIALYSIS CATHETER;  Surgeon: Mal Misty, MD;  Location: [place];  Service: Vascular;  Laterality: Right;  . REPLACEMENT TOTAL KNEE BILATERAL  05/1998  . TONSILLECTOMY    . Traumatic Amputation of Right DIP joint of Index Finger      Family History  Problem Relation Age of Onset  . Family history unknown: Yes    Social History   Social History  . Marital status: Widowed    Spouse name: [name]  . Number of children: 1  . Years of education: [name]   Occupational History  . 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 Smoking status: Former Smoker    Years: 15.00    Types: Cigarettes    Quit date: 01/06/1943  . Smokeless tobacco: Never Used  . Alcohol use Yes     Comment: Wine occasionally  . Drug use: No  . Sexual activity: No   Other Topics Concern  . Not on file   Social History Narrative   Son Evlyn Clines to make decisions for her   Discussed DNR and she requests --done 05/03/14   No tube feeds if cognitively unaware   Review of Systems Eating fair Weight is stable Sleep is variable---if trouble, she gets up and watches TV Wears seat belt Teeth are okay--recent dental appt ([place]) No heartburn or  dysphagia Bowels are fine--no blood Oliguric--she leaks so she wears pad No significant joint pain Easy bruising--no other skin problems    Objective:   Physical Exam  Constitutional: She is oriented to person, place, and time. No distress.  HENT:  Mouth/Throat: Oropharynx is clear and moist. No oropharyngeal exudate.  Neck: No thyromegaly present.  Cardiovascular: Normal rate, regular rhythm and normal heart sounds.  Exam reveals no gallop.   No  murmur heard. Feet cool but reasonably perfused No pulses palpable  Pulmonary/Chest: Effort normal and breath sounds normal. No respiratory distress. She has no wheezes. She has no rales.  Abdominal: Soft. She exhibits no distension. There is no tenderness. There is no rebound and no guarding.  Musculoskeletal: She exhibits no edema.  Left great toe amputation site clean and dry  Lymphadenopathy:    She has no cervical adenopathy.  Neurological: She is alert and oriented to person, place, and time.  President--"Trump, Obama, ?" 100-? L-[id number] Recall 3/3  Normal sensation in feet  Skin: No rash noted. No erythema.  Slight plantar callous No foot ulcers          Assessment & Plan:

## 2016-02-13 NOTE — Telephone Encounter (Signed)
ESRD on dialysis. Stable cr.

## 2016-02-13 NOTE — Assessment & Plan Note (Signed)
Probably still okay Will check labs

## 2016-02-13 NOTE — Assessment & Plan Note (Signed)
I have personally reviewed the Medicare Annual Wellness questionnaire and have noted 1. The patient's medical and social history 2. Their use of alcohol, tobacco or illicit drugs 3. Their current medications and supplements 4. The patient's functional ability including ADL's, fall risks, home safety risks and hearing or visual             impairment. 5. Diet and physical activities 6. Evidence for depression or mood disorders  The patients weight, height, BMI and visual acuity have been recorded in the chart I have made referrals, counseling and provided education to the patient based review of the above and I have provided the pt with a written personalized care plan for preventive services.  I have provided you with a copy of your personalized plan for preventive services. Please take the time to review along with your updated medication list.  Will update pneumovax No cancer screening due to age Discussed safety

## 2016-02-13 NOTE — Assessment & Plan Note (Signed)
Doing well on this so far Recent shunt revision--working well again

## 2016-02-13 NOTE — Addendum Note (Signed)
Addended by: Pilar Grammes on: 02/13/2016 04:54 PM   Modules accepted: Orders

## 2016-02-13 NOTE — Progress Notes (Signed)
Pre visit review using our clinic review tool, if applicable. No additional management support is needed unless otherwise documented below in the visit note. 

## 2016-02-14 DIAGNOSIS — N2581 Secondary hyperparathyroidism of renal origin: Secondary | ICD-10-CM | POA: Diagnosis not present

## 2016-02-14 DIAGNOSIS — D631 Anemia in chronic kidney disease: Secondary | ICD-10-CM | POA: Diagnosis not present

## 2016-02-14 DIAGNOSIS — N186 End stage renal disease: Secondary | ICD-10-CM | POA: Diagnosis not present

## 2016-02-14 DIAGNOSIS — D509 Iron deficiency anemia, unspecified: Secondary | ICD-10-CM | POA: Diagnosis not present

## 2016-02-17 DIAGNOSIS — N2581 Secondary hyperparathyroidism of renal origin: Secondary | ICD-10-CM | POA: Diagnosis not present

## 2016-02-17 DIAGNOSIS — D631 Anemia in chronic kidney disease: Secondary | ICD-10-CM | POA: Diagnosis not present

## 2016-02-17 DIAGNOSIS — N186 End stage renal disease: Secondary | ICD-10-CM | POA: Diagnosis not present

## 2016-02-17 DIAGNOSIS — D509 Iron deficiency anemia, unspecified: Secondary | ICD-10-CM | POA: Diagnosis not present

## 2016-02-19 DIAGNOSIS — D631 Anemia in chronic kidney disease: Secondary | ICD-10-CM | POA: Diagnosis not present

## 2016-02-19 DIAGNOSIS — N186 End stage renal disease: Secondary | ICD-10-CM | POA: Diagnosis not present

## 2016-02-19 DIAGNOSIS — D509 Iron deficiency anemia, unspecified: Secondary | ICD-10-CM | POA: Diagnosis not present

## 2016-02-19 DIAGNOSIS — N2581 Secondary hyperparathyroidism of renal origin: Secondary | ICD-10-CM | POA: Diagnosis not present

## 2016-02-21 DIAGNOSIS — D509 Iron deficiency anemia, unspecified: Secondary | ICD-10-CM | POA: Diagnosis not present

## 2016-02-21 DIAGNOSIS — N186 End stage renal disease: Secondary | ICD-10-CM | POA: Diagnosis not present

## 2016-02-21 DIAGNOSIS — N2581 Secondary hyperparathyroidism of renal origin: Secondary | ICD-10-CM | POA: Diagnosis not present

## 2016-02-21 DIAGNOSIS — D631 Anemia in chronic kidney disease: Secondary | ICD-10-CM | POA: Diagnosis not present

## 2016-02-24 DIAGNOSIS — N186 End stage renal disease: Secondary | ICD-10-CM | POA: Diagnosis not present

## 2016-02-24 DIAGNOSIS — D509 Iron deficiency anemia, unspecified: Secondary | ICD-10-CM | POA: Diagnosis not present

## 2016-02-24 DIAGNOSIS — N2581 Secondary hyperparathyroidism of renal origin: Secondary | ICD-10-CM | POA: Diagnosis not present

## 2016-02-24 DIAGNOSIS — D631 Anemia in chronic kidney disease: Secondary | ICD-10-CM | POA: Diagnosis not present

## 2016-02-26 DIAGNOSIS — D509 Iron deficiency anemia, unspecified: Secondary | ICD-10-CM | POA: Diagnosis not present

## 2016-02-26 DIAGNOSIS — N186 End stage renal disease: Secondary | ICD-10-CM | POA: Diagnosis not present

## 2016-02-26 DIAGNOSIS — N2581 Secondary hyperparathyroidism of renal origin: Secondary | ICD-10-CM | POA: Diagnosis not present

## 2016-02-26 DIAGNOSIS — D631 Anemia in chronic kidney disease: Secondary | ICD-10-CM | POA: Diagnosis not present

## 2016-02-28 DIAGNOSIS — N2581 Secondary hyperparathyroidism of renal origin: Secondary | ICD-10-CM | POA: Diagnosis not present

## 2016-02-28 DIAGNOSIS — D631 Anemia in chronic kidney disease: Secondary | ICD-10-CM | POA: Diagnosis not present

## 2016-02-28 DIAGNOSIS — D509 Iron deficiency anemia, unspecified: Secondary | ICD-10-CM | POA: Diagnosis not present

## 2016-02-28 DIAGNOSIS — N186 End stage renal disease: Secondary | ICD-10-CM | POA: Diagnosis not present

## 2016-03-02 DIAGNOSIS — N2581 Secondary hyperparathyroidism of renal origin: Secondary | ICD-10-CM | POA: Diagnosis not present

## 2016-03-02 DIAGNOSIS — D509 Iron deficiency anemia, unspecified: Secondary | ICD-10-CM | POA: Diagnosis not present

## 2016-03-02 DIAGNOSIS — N186 End stage renal disease: Secondary | ICD-10-CM | POA: Diagnosis not present

## 2016-03-02 DIAGNOSIS — D631 Anemia in chronic kidney disease: Secondary | ICD-10-CM | POA: Diagnosis not present

## 2016-03-04 DIAGNOSIS — N2581 Secondary hyperparathyroidism of renal origin: Secondary | ICD-10-CM | POA: Diagnosis not present

## 2016-03-04 DIAGNOSIS — I120 Hypertensive chronic kidney disease with stage 5 chronic kidney disease or end stage renal disease: Secondary | ICD-10-CM | POA: Diagnosis not present

## 2016-03-04 DIAGNOSIS — D509 Iron deficiency anemia, unspecified: Secondary | ICD-10-CM | POA: Diagnosis not present

## 2016-03-04 DIAGNOSIS — D631 Anemia in chronic kidney disease: Secondary | ICD-10-CM | POA: Diagnosis not present

## 2016-03-04 DIAGNOSIS — Z992 Dependence on renal dialysis: Secondary | ICD-10-CM | POA: Diagnosis not present

## 2016-03-04 DIAGNOSIS — N186 End stage renal disease: Secondary | ICD-10-CM | POA: Diagnosis not present

## 2016-03-06 DIAGNOSIS — E119 Type 2 diabetes mellitus without complications: Secondary | ICD-10-CM | POA: Diagnosis not present

## 2016-03-06 DIAGNOSIS — D509 Iron deficiency anemia, unspecified: Secondary | ICD-10-CM | POA: Diagnosis not present

## 2016-03-06 DIAGNOSIS — N2581 Secondary hyperparathyroidism of renal origin: Secondary | ICD-10-CM | POA: Diagnosis not present

## 2016-03-06 DIAGNOSIS — N186 End stage renal disease: Secondary | ICD-10-CM | POA: Diagnosis not present

## 2016-03-09 DIAGNOSIS — N2581 Secondary hyperparathyroidism of renal origin: Secondary | ICD-10-CM | POA: Diagnosis not present

## 2016-03-09 DIAGNOSIS — E119 Type 2 diabetes mellitus without complications: Secondary | ICD-10-CM | POA: Diagnosis not present

## 2016-03-09 DIAGNOSIS — N186 End stage renal disease: Secondary | ICD-10-CM | POA: Diagnosis not present

## 2016-03-09 DIAGNOSIS — D509 Iron deficiency anemia, unspecified: Secondary | ICD-10-CM | POA: Diagnosis not present

## 2016-03-11 DIAGNOSIS — E119 Type 2 diabetes mellitus without complications: Secondary | ICD-10-CM | POA: Diagnosis not present

## 2016-03-11 DIAGNOSIS — D509 Iron deficiency anemia, unspecified: Secondary | ICD-10-CM | POA: Diagnosis not present

## 2016-03-11 DIAGNOSIS — N186 End stage renal disease: Secondary | ICD-10-CM | POA: Diagnosis not present

## 2016-03-11 DIAGNOSIS — N2581 Secondary hyperparathyroidism of renal origin: Secondary | ICD-10-CM | POA: Diagnosis not present

## 2016-03-13 DIAGNOSIS — D509 Iron deficiency anemia, unspecified: Secondary | ICD-10-CM | POA: Diagnosis not present

## 2016-03-13 DIAGNOSIS — E119 Type 2 diabetes mellitus without complications: Secondary | ICD-10-CM | POA: Diagnosis not present

## 2016-03-13 DIAGNOSIS — N186 End stage renal disease: Secondary | ICD-10-CM | POA: Diagnosis not present

## 2016-03-13 DIAGNOSIS — N2581 Secondary hyperparathyroidism of renal origin: Secondary | ICD-10-CM | POA: Diagnosis not present

## 2016-03-16 DIAGNOSIS — D509 Iron deficiency anemia, unspecified: Secondary | ICD-10-CM | POA: Diagnosis not present

## 2016-03-16 DIAGNOSIS — E119 Type 2 diabetes mellitus without complications: Secondary | ICD-10-CM | POA: Diagnosis not present

## 2016-03-16 DIAGNOSIS — N2581 Secondary hyperparathyroidism of renal origin: Secondary | ICD-10-CM | POA: Diagnosis not present

## 2016-03-16 DIAGNOSIS — N186 End stage renal disease: Secondary | ICD-10-CM | POA: Diagnosis not present

## 2016-03-18 DIAGNOSIS — E119 Type 2 diabetes mellitus without complications: Secondary | ICD-10-CM | POA: Diagnosis not present

## 2016-03-18 DIAGNOSIS — N2581 Secondary hyperparathyroidism of renal origin: Secondary | ICD-10-CM | POA: Diagnosis not present

## 2016-03-18 DIAGNOSIS — D509 Iron deficiency anemia, unspecified: Secondary | ICD-10-CM | POA: Diagnosis not present

## 2016-03-18 DIAGNOSIS — N186 End stage renal disease: Secondary | ICD-10-CM | POA: Diagnosis not present

## 2016-03-20 DIAGNOSIS — N2581 Secondary hyperparathyroidism of renal origin: Secondary | ICD-10-CM | POA: Diagnosis not present

## 2016-03-20 DIAGNOSIS — D509 Iron deficiency anemia, unspecified: Secondary | ICD-10-CM | POA: Diagnosis not present

## 2016-03-20 DIAGNOSIS — N186 End stage renal disease: Secondary | ICD-10-CM | POA: Diagnosis not present

## 2016-03-20 DIAGNOSIS — E119 Type 2 diabetes mellitus without complications: Secondary | ICD-10-CM | POA: Diagnosis not present

## 2016-03-23 DIAGNOSIS — E119 Type 2 diabetes mellitus without complications: Secondary | ICD-10-CM | POA: Diagnosis not present

## 2016-03-23 DIAGNOSIS — N2581 Secondary hyperparathyroidism of renal origin: Secondary | ICD-10-CM | POA: Diagnosis not present

## 2016-03-23 DIAGNOSIS — N186 End stage renal disease: Secondary | ICD-10-CM | POA: Diagnosis not present

## 2016-03-23 DIAGNOSIS — D509 Iron deficiency anemia, unspecified: Secondary | ICD-10-CM | POA: Diagnosis not present

## 2016-03-25 DIAGNOSIS — N186 End stage renal disease: Secondary | ICD-10-CM | POA: Diagnosis not present

## 2016-03-25 DIAGNOSIS — D509 Iron deficiency anemia, unspecified: Secondary | ICD-10-CM | POA: Diagnosis not present

## 2016-03-25 DIAGNOSIS — E119 Type 2 diabetes mellitus without complications: Secondary | ICD-10-CM | POA: Diagnosis not present

## 2016-03-25 DIAGNOSIS — N2581 Secondary hyperparathyroidism of renal origin: Secondary | ICD-10-CM | POA: Diagnosis not present

## 2016-03-27 DIAGNOSIS — N186 End stage renal disease: Secondary | ICD-10-CM | POA: Diagnosis not present

## 2016-03-27 DIAGNOSIS — E119 Type 2 diabetes mellitus without complications: Secondary | ICD-10-CM | POA: Diagnosis not present

## 2016-03-27 DIAGNOSIS — N2581 Secondary hyperparathyroidism of renal origin: Secondary | ICD-10-CM | POA: Diagnosis not present

## 2016-03-27 DIAGNOSIS — D509 Iron deficiency anemia, unspecified: Secondary | ICD-10-CM | POA: Diagnosis not present

## 2016-03-30 DIAGNOSIS — E119 Type 2 diabetes mellitus without complications: Secondary | ICD-10-CM | POA: Diagnosis not present

## 2016-03-30 DIAGNOSIS — N2581 Secondary hyperparathyroidism of renal origin: Secondary | ICD-10-CM | POA: Diagnosis not present

## 2016-03-30 DIAGNOSIS — N186 End stage renal disease: Secondary | ICD-10-CM | POA: Diagnosis not present

## 2016-03-30 DIAGNOSIS — D509 Iron deficiency anemia, unspecified: Secondary | ICD-10-CM | POA: Diagnosis not present

## 2016-04-01 ENCOUNTER — Other Ambulatory Visit: Payer: Self-pay | Admitting: Internal Medicine

## 2016-04-01 DIAGNOSIS — N2581 Secondary hyperparathyroidism of renal origin: Secondary | ICD-10-CM | POA: Diagnosis not present

## 2016-04-01 DIAGNOSIS — N186 End stage renal disease: Secondary | ICD-10-CM | POA: Diagnosis not present

## 2016-04-01 DIAGNOSIS — D509 Iron deficiency anemia, unspecified: Secondary | ICD-10-CM | POA: Diagnosis not present

## 2016-04-01 DIAGNOSIS — E119 Type 2 diabetes mellitus without complications: Secondary | ICD-10-CM | POA: Diagnosis not present

## 2016-04-03 ENCOUNTER — Other Ambulatory Visit: Payer: Self-pay | Admitting: Internal Medicine

## 2016-04-03 DIAGNOSIS — D509 Iron deficiency anemia, unspecified: Secondary | ICD-10-CM | POA: Diagnosis not present

## 2016-04-03 DIAGNOSIS — N186 End stage renal disease: Secondary | ICD-10-CM | POA: Diagnosis not present

## 2016-04-03 DIAGNOSIS — N2581 Secondary hyperparathyroidism of renal origin: Secondary | ICD-10-CM | POA: Diagnosis not present

## 2016-04-03 DIAGNOSIS — E119 Type 2 diabetes mellitus without complications: Secondary | ICD-10-CM | POA: Diagnosis not present

## 2016-04-04 DIAGNOSIS — N186 End stage renal disease: Secondary | ICD-10-CM | POA: Diagnosis not present

## 2016-04-04 DIAGNOSIS — I120 Hypertensive chronic kidney disease with stage 5 chronic kidney disease or end stage renal disease: Secondary | ICD-10-CM | POA: Diagnosis not present

## 2016-04-04 DIAGNOSIS — Z992 Dependence on renal dialysis: Secondary | ICD-10-CM | POA: Diagnosis not present

## 2016-04-06 DIAGNOSIS — N2581 Secondary hyperparathyroidism of renal origin: Secondary | ICD-10-CM | POA: Diagnosis not present

## 2016-04-06 DIAGNOSIS — N186 End stage renal disease: Secondary | ICD-10-CM | POA: Diagnosis not present

## 2016-04-06 DIAGNOSIS — D631 Anemia in chronic kidney disease: Secondary | ICD-10-CM | POA: Diagnosis not present

## 2016-04-06 NOTE — Telephone Encounter (Signed)
Ok to refill? Last prescribed by Webb Silversmith and before that was Dr Posey Pronto. Last seen on 02/13/2016

## 2016-04-07 NOTE — Telephone Encounter (Signed)
Approved: okay x 1 year 

## 2016-04-08 DIAGNOSIS — D631 Anemia in chronic kidney disease: Secondary | ICD-10-CM | POA: Diagnosis not present

## 2016-04-08 DIAGNOSIS — N186 End stage renal disease: Secondary | ICD-10-CM | POA: Diagnosis not present

## 2016-04-08 DIAGNOSIS — N2581 Secondary hyperparathyroidism of renal origin: Secondary | ICD-10-CM | POA: Diagnosis not present

## 2016-04-09 ENCOUNTER — Encounter (INDEPENDENT_AMBULATORY_CARE_PROVIDER_SITE_OTHER): Payer: Self-pay

## 2016-04-09 ENCOUNTER — Ambulatory Visit (INDEPENDENT_AMBULATORY_CARE_PROVIDER_SITE_OTHER): Payer: Medicare Other | Admitting: Orthopedic Surgery

## 2016-04-09 DIAGNOSIS — B351 Tinea unguium: Secondary | ICD-10-CM

## 2016-04-09 NOTE — Progress Notes (Signed)
Office Visit Note   Patient: [name]           Date of Birth: July 14, 1920           MRN: [id number] Visit Date: 04/09/2016              Requested by: [name], MD [place],  [id number] PCP: Viviana Simpler, MD  No chief complaint on file.     HPI: The patient is a 83 year old woman who presents today for evaluation of bilateral feet. She has thickened and discolored onychomycotic nails times daily.   Assessment & Plan: Visit Diagnoses:  1. Onychomycosis     Plan: Nails trimmed bilaterally today follow-up in office in 3 months.  Follow-Up Instructions: Return in about 3 months (around 07/09/2016).   Ortho Exam  Patient is alert, oriented, no adenopathy, well-dressed, normal affect, normal respiratory effort. Thickened and discolored onychomycotic nails 8. She is unable to safely trim her own nails due to insensate neuropathy. These were trimmed today without incident.  Imaging: No results found.  Labs: Lab Results  Component Value Date   HGBA1C 5.9 02/13/2016   HGBA1C 5.8 05/21/2015   HGBA1C 6.2 11/20/2014   ESRSEDRATE 135 (H) 07/19/2010   ESRSEDRATE >140 (H) 05/18/2010   CRP 23.8 (H) 05/18/2010   REPTSTATUS 09/20/2015 FINAL 09/18/2015   GRAMSTAIN  07/18/2010    FEW WBC PRESENT,BOTH PMN AND MONONUCLEAR FEW GRAM POSITIVE COCCI IN PAIRS   GRAMSTAIN  07/18/2010    FEW WBC PRESENT,BOTH PMN AND MONONUCLEAR FEW GRAM POSITIVE COCCI IN PAIRS Performed at [place]  07/18/2010    FEW WBC PRESENT,BOTH PMN AND MONONUCLEAR FEW GRAM POSITIVE COCCI IN PAIRS Performed at [place] >=100,000 COLONIES/mL ESCHERICHIA COLI (A) 09/18/2015   LABORGA ESCHERICHIA COLI (A) 09/18/2015    Orders:  No orders of the defined types were placed in this encounter.  No orders of the defined types were placed in this encounter.    Procedures: No procedures performed  Clinical Data: No additional  findings.  ROS:  Review of Systems  Constitutional: Negative for chills and fever.  Skin: Negative for color change and wound.    Objective: Vital Signs: There were no vitals taken for this visit.  Specialty Comments:  No specialty comments available.  PMFS History: Patient Active Problem List   Diagnosis Date Noted  . Cerebrovascular accident (CVA) due to occlusion of right middle cerebral artery ([place]) 10/24/2015  . Malnutrition of mild degree ([place]) 10/24/2015  . Altered mental status 09/18/2015  . Preventative health care 05/03/2014  . Peripheral vascular disease ([place]) 12/21/2013  . Toe amputation status ([place]) 08/29/2013  . History of recent fall 04/18/2013  . HTN (hypertension), malignant 03/22/2013  . S/P aortic valve replacement with bioprosthetic valve 11/15/2012  . S/P CABG x 3 11/15/2012  . Anemia of chronic kidney failure 12/22/2010  . Thrombocytopenia ([place]) 12/13/2010  . End stage renal disease on dialysis ([place]) 12/13/2010  . S/P aortic valve replacement 08/11/2010  . Arterial insufficiency-lower 07/25/2010  . NEURODERMATITIS 04/08/2009  . Carotid arterial disease ([place]) 01/01/2009  . Coronary atherosclerosis 02/11/2007  . AORTIC STENOSIS 10/04/2006  . ANXIETY 05/30/2006  . Pulmonary hypertension 05/30/2006  . Hypothyroidism 05/27/2006  . Type 2 diabetes mellitus with renal manifestations, controlled ([place]) 05/27/2006  . Hyperlipidemia 05/27/2006  . Essential hypertension 05/27/2006  . GERD 05/27/2006  . OSTEOARTHRITIS 05/27/2006  . OSTEOPENIA 05/27/2006  . URINARY  INCONTINENCE 05/27/2006   Past Medical History:  Diagnosis Date  . Anemia    NOS  . Anxiety   . Aortic stenosis   . Cancer [facility])    Mole on top of head to be removed in July 2013  . CHF (congestive heart failure) ([place])   . Constipation   . Coronary artery disease   . Diabetes mellitus    type II;was on Glipizide but has been off x 108mon  . Dialysis patient ([place])   . Diarrhea   . GERD  (gastroesophageal reflux disease)   . History of blood transfusion   . Hyperlipidemia    takes Simvastatin nightly  . Hyperlipidemia   . Hypertension    takes Metoprolol daily  . Hypothyroidism    takes Synthroid daily  . Migraine    hx of  . Oligouria   . Osteoarthritis   . Osteopenia   . Peripheral vascular disease ([place])   . Personal history of colonic polyps   . Pulmonary hypertension   . Renal insufficiency   . Urinary incontinence   . UTI (urinary tract infection)     Family History  Problem Relation Age of Onset  . Family history unknown: Yes    Past Surgical History:  Procedure Laterality Date  . ABDOMINAL HYSTERECTOMY    . adenosine myoview  2007   benign, EF 69%  . AMPUTATION  06/18/2011   Procedure: AMPUTATION DIGIT;  Surgeon: Newt Minion, MD;  Location: [place];  Service: Orthopedics;  Laterality: Left;  Left 2nd Toe Amputation at MTP Joint  . Amputation-left great toe  7/12   Dr Marlou Sa  . AORTIC VALVE REPLACEMENT  2009  . APPENDECTOMY    . AV FISTULA PLACEMENT  12/22/2010   Procedure: ARTERIOVENOUS (AV) FISTULA CREATION;  Surgeon: Hinda Lenis, MD;  Location: [place];  Service: Vascular;  Laterality: Left;  Creation of Left Brachial-Cephalic Fistula  . CARDIAC CATHETERIZATION  2000   cad  . CAROTID ENDARTERECTOMY  2011   Right  . CATARACT EXTRACTION    . CORONARY ARTERY BYPASS GRAFT     od  . CORONARY ARTERY BYPASS GRAFT  2009  . EYE SURGERY     BIlateral  . INSERTION OF DIALYSIS CATHETER  12/18/2010   Procedure: INSERTION OF DIALYSIS CATHETER;  Surgeon: Mal Misty, MD;  Location: [place];  Service: Vascular;  Laterality: Right;  . REPLACEMENT TOTAL KNEE BILATERAL  05/1998  . TONSILLECTOMY    . Traumatic Amputation of Right DIP joint of Index Finger     Social History   Occupational History  . 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 Smoking status: Former Smoker    Years: 15.00    Types: Cigarettes    Quit  date: 01/06/1943  . Smokeless tobacco: Never Used  . Alcohol use Yes     Comment: Wine occasionally  . Drug use: No  . Sexual activity: No

## 2016-04-10 DIAGNOSIS — N2581 Secondary hyperparathyroidism of renal origin: Secondary | ICD-10-CM | POA: Diagnosis not present

## 2016-04-10 DIAGNOSIS — N186 End stage renal disease: Secondary | ICD-10-CM | POA: Diagnosis not present

## 2016-04-10 DIAGNOSIS — D631 Anemia in chronic kidney disease: Secondary | ICD-10-CM | POA: Diagnosis not present

## 2016-04-13 DIAGNOSIS — N2581 Secondary hyperparathyroidism of renal origin: Secondary | ICD-10-CM | POA: Diagnosis not present

## 2016-04-13 DIAGNOSIS — N186 End stage renal disease: Secondary | ICD-10-CM | POA: Diagnosis not present

## 2016-04-13 DIAGNOSIS — D631 Anemia in chronic kidney disease: Secondary | ICD-10-CM | POA: Diagnosis not present

## 2016-04-15 DIAGNOSIS — N186 End stage renal disease: Secondary | ICD-10-CM | POA: Diagnosis not present

## 2016-04-15 DIAGNOSIS — D631 Anemia in chronic kidney disease: Secondary | ICD-10-CM | POA: Diagnosis not present

## 2016-04-15 DIAGNOSIS — N2581 Secondary hyperparathyroidism of renal origin: Secondary | ICD-10-CM | POA: Diagnosis not present

## 2016-04-17 DIAGNOSIS — D631 Anemia in chronic kidney disease: Secondary | ICD-10-CM | POA: Diagnosis not present

## 2016-04-17 DIAGNOSIS — N186 End stage renal disease: Secondary | ICD-10-CM | POA: Diagnosis not present

## 2016-04-17 DIAGNOSIS — N2581 Secondary hyperparathyroidism of renal origin: Secondary | ICD-10-CM | POA: Diagnosis not present

## 2016-04-20 DIAGNOSIS — N2581 Secondary hyperparathyroidism of renal origin: Secondary | ICD-10-CM | POA: Diagnosis not present

## 2016-04-20 DIAGNOSIS — N186 End stage renal disease: Secondary | ICD-10-CM | POA: Diagnosis not present

## 2016-04-20 DIAGNOSIS — D631 Anemia in chronic kidney disease: Secondary | ICD-10-CM | POA: Diagnosis not present

## 2016-04-22 DIAGNOSIS — N2581 Secondary hyperparathyroidism of renal origin: Secondary | ICD-10-CM | POA: Diagnosis not present

## 2016-04-22 DIAGNOSIS — N186 End stage renal disease: Secondary | ICD-10-CM | POA: Diagnosis not present

## 2016-04-22 DIAGNOSIS — D631 Anemia in chronic kidney disease: Secondary | ICD-10-CM | POA: Diagnosis not present

## 2016-04-24 DIAGNOSIS — D631 Anemia in chronic kidney disease: Secondary | ICD-10-CM | POA: Diagnosis not present

## 2016-04-24 DIAGNOSIS — N2581 Secondary hyperparathyroidism of renal origin: Secondary | ICD-10-CM | POA: Diagnosis not present

## 2016-04-24 DIAGNOSIS — N186 End stage renal disease: Secondary | ICD-10-CM | POA: Diagnosis not present

## 2016-04-27 DIAGNOSIS — N186 End stage renal disease: Secondary | ICD-10-CM | POA: Diagnosis not present

## 2016-04-27 DIAGNOSIS — D631 Anemia in chronic kidney disease: Secondary | ICD-10-CM | POA: Diagnosis not present

## 2016-04-27 DIAGNOSIS — N2581 Secondary hyperparathyroidism of renal origin: Secondary | ICD-10-CM | POA: Diagnosis not present

## 2016-04-29 DIAGNOSIS — D631 Anemia in chronic kidney disease: Secondary | ICD-10-CM | POA: Diagnosis not present

## 2016-04-29 DIAGNOSIS — N186 End stage renal disease: Secondary | ICD-10-CM | POA: Diagnosis not present

## 2016-04-29 DIAGNOSIS — N2581 Secondary hyperparathyroidism of renal origin: Secondary | ICD-10-CM | POA: Diagnosis not present

## 2016-05-01 DIAGNOSIS — D631 Anemia in chronic kidney disease: Secondary | ICD-10-CM | POA: Diagnosis not present

## 2016-05-01 DIAGNOSIS — N2581 Secondary hyperparathyroidism of renal origin: Secondary | ICD-10-CM | POA: Diagnosis not present

## 2016-05-01 DIAGNOSIS — N186 End stage renal disease: Secondary | ICD-10-CM | POA: Diagnosis not present

## 2016-05-04 DIAGNOSIS — Z992 Dependence on renal dialysis: Secondary | ICD-10-CM | POA: Diagnosis not present

## 2016-05-04 DIAGNOSIS — N2581 Secondary hyperparathyroidism of renal origin: Secondary | ICD-10-CM | POA: Diagnosis not present

## 2016-05-04 DIAGNOSIS — I120 Hypertensive chronic kidney disease with stage 5 chronic kidney disease or end stage renal disease: Secondary | ICD-10-CM | POA: Diagnosis not present

## 2016-05-04 DIAGNOSIS — D631 Anemia in chronic kidney disease: Secondary | ICD-10-CM | POA: Diagnosis not present

## 2016-05-04 DIAGNOSIS — N186 End stage renal disease: Secondary | ICD-10-CM | POA: Diagnosis not present

## 2016-05-05 DIAGNOSIS — E119 Type 2 diabetes mellitus without complications: Secondary | ICD-10-CM | POA: Diagnosis not present

## 2016-05-05 LAB — HM DIABETES EYE EXAM

## 2016-05-06 DIAGNOSIS — N2581 Secondary hyperparathyroidism of renal origin: Secondary | ICD-10-CM | POA: Diagnosis not present

## 2016-05-06 DIAGNOSIS — N186 End stage renal disease: Secondary | ICD-10-CM | POA: Diagnosis not present

## 2016-05-06 DIAGNOSIS — D631 Anemia in chronic kidney disease: Secondary | ICD-10-CM | POA: Diagnosis not present

## 2016-05-06 NOTE — Progress Notes (Signed)
Cardiology Office Note  Date:  05/07/2016   ID:  [name], DOB [birth year], MRN [id number]  PCP:  Viviana Simpler, MD   Chief Complaint  Patient presents with  . other    6 month f/u.  Pt's son takes care of her meds, so she is unsure as to what she takes.  She denies any recent med changes. Pt has large purple knot on rt side of her forehead.  Denies pain or dizziness.    HPI:   [name] is a very pleasant 83 year old woman with history of  CAD,  severe aortic valve stenosis bypass surgery x3 vessel in 2009 with bioprosthetic aortic valve  diet controlled diabetes,  previous smoking,  moderate bilateral carotid disease,  end-stage renal disease on hemodialysis 3 days per week who presents for routine followup of her  Coronary artery disease   Lab work reviewed with her on today's visit HBA1C 5.9 Total chol 148, LDL 78  She reports that she fell this morning, hit the right side of her for head, lost her balance She did not bring her hearing aids, having some difficulty communicating  Denies any symptoms concerning for angina, no significant shortness of breath Active at baseline Reports having viral bug 2 weeks ago, took her one week to get over it Now having some right upper quadrant discomfort. Has not had regular bowel movements, wonders if it could be constipation. Abdominal discomfort on deep inspiration No significant tenderness if she palpates  She is participating in cardiac rehabilitation  EKG personally reviewed by myself on todays visit Shows normal sinus rhythm with rate 67 bpm poor R-wave progression through the anterior precordial leads left axis deviation  Other past medical history reviewed September 2017 she went to dialysis, did not feel well. Sent to the emergency room Diagnosed with UTI,  in hospital 5 days, 9/13th Abn EKG on arrival, troponin normal (checked one time) She spent some time in Rehab 20 days  History of incontinence Total cholesterol  158, hemoglobin A1c 5.9  hemodialysis on March 22 2013 and had syncope. She was taken to the hospital and diagnosed with urinary tract infection with encephalopathy. She was given antibiotics with improvement of her symptoms. Troponin 0.37 and 0.41. This was felt to be secondary to demand ischemia.  Remote history of MRSA and C. Difficile in December 2014 Echocardiogram in late 2014, well-seated valve   PMH:   has a past medical history of Anemia; Anxiety; Aortic stenosis; Cancer [facility]); CHF (congestive heart failure) ([place]); Constipation; Coronary artery disease; Diabetes mellitus; Dialysis patient [facility]); Diarrhea; GERD (gastroesophageal reflux disease); History of blood transfusion; Hyperlipidemia; Hyperlipidemia; Hypertension; Hypothyroidism; Migraine; Oligouria; Osteoarthritis; Osteopenia; Peripheral vascular disease ([place]); Personal history of colonic polyps; Pulmonary hypertension ([place]); Renal insufficiency; Urinary incontinence; and UTI (urinary tract infection).  PSH:    Past Surgical History:  Procedure Laterality Date  . ABDOMINAL HYSTERECTOMY    . adenosine myoview  2007   benign, EF 69%  . AMPUTATION  06/18/2011   Procedure: AMPUTATION DIGIT;  Surgeon: Newt Minion, MD;  Location: [place];  Service: Orthopedics;  Laterality: Left;  Left 2nd Toe Amputation at MTP Joint  . Amputation-left great toe  7/12   Dr Marlou Sa  . AORTIC VALVE REPLACEMENT  2009  . APPENDECTOMY    . AV FISTULA PLACEMENT  12/22/2010   Procedure: ARTERIOVENOUS (AV) FISTULA CREATION;  Surgeon: Hinda Lenis, MD;  Location: [place];  Service: Vascular;  Laterality: Left;  Creation of Left Brachial-Cephalic  Fistula  . CARDIAC CATHETERIZATION  2000   cad  . CAROTID ENDARTERECTOMY  2011   Right  . CATARACT EXTRACTION    . CORONARY ARTERY BYPASS GRAFT     od  . CORONARY ARTERY BYPASS GRAFT  2009  . EYE SURGERY     BIlateral  . INSERTION OF DIALYSIS CATHETER  12/18/2010   Procedure: INSERTION OF DIALYSIS  CATHETER;  Surgeon: Mal Misty, MD;  Location: [place];  Service: Vascular;  Laterality: Right;  . REPLACEMENT TOTAL KNEE BILATERAL  05/1998  . TONSILLECTOMY    . Traumatic Amputation of Right DIP joint of Index Finger      Current Outpatient Prescriptions  Medication Sig Dispense Refill  . amLODipine (NORVASC) 5 MG tablet Take 1 tablet (5 mg total) by mouth daily. 90 tablet 3  . aspirin EC 81 MG tablet Take 81 mg by mouth at bedtime.    . calcium carbonate (TUMS - DOSED IN MG ELEMENTAL CALCIUM) 500 MG chewable tablet Chew 1 tablet (200 mg of elemental calcium total) by mouth 3 (three) times daily with meals.    . cloNIDine (CATAPRES) 0.1 MG tablet Take 1 tablet (0.1 mg total) by mouth 2 (two) times daily. 60 tablet 11  . clopidogrel (PLAVIX) 75 MG tablet Take 1 tablet (75 mg total) by mouth daily. 30 tablet 11  . folic acid (FOLVITE) 0.5 MG tablet Take 0.5 tablets (0.5 mg total) by mouth daily.    . hydrALAZINE (APRESOLINE) 25 MG tablet TAKE ONE TABLET BY MOUTH EVERY 8 HOURS. 90 tablet 0  . levothyroxine (SYNTHROID, LEVOTHROID) 75 MCG tablet TAKE ONE TABLET BY MOUTH ONCE DAILY 30 tablet 11  . metoprolol (LOPRESSOR) 50 MG tablet Take 1 tablet (50 mg total) by mouth 2 (two) times daily. 180 tablet 3  . polyvinyl alcohol (LIQUIFILM TEARS) 1.4 % ophthalmic solution Place 1 drop into both eyes as needed for dry eyes. 15 mL 0  . pravastatin (PRAVACHOL) 40 MG tablet Take 1 tablet (40 mg total) by mouth every evening. 90 tablet 4  . vitamin C (VITAMIN C) 500 MG tablet Take 1 tablet (500 mg total) by mouth daily.     No current facility-administered medications for this visit.      Allergies:   Nitrofurantoin; Captopril; Enalapril maleate; Ramipril; Sulfa antibiotics; and Verapamil   Social History:  The patient  reports that she quit smoking about 73 years ago. Her smoking use included Cigarettes. She quit after 15.00 years of use. She has never used smokeless tobacco. She reports that she  drinks alcohol. She reports that she does not use drugs.   Family History:   Family history is unknown by patient.    Review of Systems: Review of Systems  Constitutional: Negative.   Respiratory: Negative.   Cardiovascular: Negative.   Gastrointestinal: Positive for abdominal pain.  Musculoskeletal: Negative.   Neurological: Negative.   Psychiatric/Behavioral: Negative.   All other systems reviewed and are negative.    PHYSICAL EXAM: VS:  BP (!) 130/50 (BP Location: Right Arm, Patient Position: Sitting, Cuff Size: Normal)   Pulse 67   Ht 5\' 2"  (1.575 m)   Wt 113 lb (51.3 kg)   BMI 20.67 kg/m  , BMI Body mass index is 20.67 kg/m. GEN: Well nourished, well developed, in no acute distress  HEENT: normal  Neck: no JVD, carotid bruits, or masses Cardiac: RRR; no murmurs, rubs, or gallops,no edema  Respiratory:  clear to auscultation bilaterally, normal work of breathing GI:  soft, nontender, nondistended, + BS MS: no deformity or atrophy  Skin: warm and dry, no rash Neuro:  Strength and sensation are intact  Psych: euthymic mood, full affect    Recent Labs: 05/21/2015: TSH 1.79 02/13/2016: ALT 12; BUN 39; Creatinine, Ser 3.91; Hemoglobin 10.1; Platelets 275.0; Potassium 5.3; Sodium 135    Lipid Panel Lab Results  Component Value Date   CHOL 148 02/13/2016   HDL 52.40 02/13/2016   LDLCALC 78 02/13/2016   TRIG 89.0 02/13/2016      Wt Readings from Last 3 Encounters:  05/07/16 113 lb (51.3 kg)  02/13/16 111 lb 4 oz (50.5 kg)  01/01/16 117 lb 4 oz (53.2 kg)       ASSESSMENT AND PLAN:   Essential hypertension - Blood pressure is well controlled on today's visit. No changes made to the medications.  Hyperlipidemia, unspecified hyperlipidemia type -  W/[place] / EF Cholesterol is at goal on the current lipid regimen. No changes to the medications were made.  CAD in native artery  Currently with no symptoms of angina. No further workup at this time.  Continue current medication regimen.  Hx of CABG - Plan: NM Myocar Multi W/Spect W/[place] / EF  history of coronary artery disease, bypass surgery  Currently in heart track   Abdominal pain Recent viral infection, suspect she may have some constipation  Falls Trauma to her right for head, evaluated on today's visit Recommended icing  End-stage renal disease on hemodialysis Appears relatively euvolemic    Total encounter time more than 25 minutes  Greater than 50% was spent in counseling and coordination of care with the patient    Disposition:   F/U  6 months   Orders Placed This Encounter  Procedures  . EKG 12-Lead     Signed, Esmond Plants, M.D., Ph.D. 05/07/2016  [facility] [place], [place] [phone]

## 2016-05-07 ENCOUNTER — Ambulatory Visit (INDEPENDENT_AMBULATORY_CARE_PROVIDER_SITE_OTHER): Payer: Medicare Other | Admitting: Cardiovascular Disease

## 2016-05-07 ENCOUNTER — Encounter: Payer: Self-pay | Admitting: Cardiovascular Disease

## 2016-05-07 VITALS — BP 130/50 | HR 67 | Ht 62.0 in | Wt 113.0 lb

## 2016-05-07 DIAGNOSIS — R1011 Right upper quadrant pain: Secondary | ICD-10-CM

## 2016-05-07 DIAGNOSIS — Z953 Presence of xenogenic heart valve: Secondary | ICD-10-CM | POA: Diagnosis not present

## 2016-05-07 DIAGNOSIS — Z952 Presence of prosthetic heart valve: Secondary | ICD-10-CM | POA: Diagnosis not present

## 2016-05-07 DIAGNOSIS — Z951 Presence of aortocoronary bypass graft: Secondary | ICD-10-CM

## 2016-05-07 DIAGNOSIS — E1121 Type 2 diabetes mellitus with diabetic nephropathy: Secondary | ICD-10-CM

## 2016-05-07 DIAGNOSIS — N186 End stage renal disease: Secondary | ICD-10-CM | POA: Diagnosis not present

## 2016-05-07 DIAGNOSIS — Z992 Dependence on renal dialysis: Secondary | ICD-10-CM

## 2016-05-07 DIAGNOSIS — R296 Repeated falls: Secondary | ICD-10-CM

## 2016-05-07 NOTE — Patient Instructions (Signed)

## 2016-05-08 DIAGNOSIS — D631 Anemia in chronic kidney disease: Secondary | ICD-10-CM | POA: Diagnosis not present

## 2016-05-08 DIAGNOSIS — N2581 Secondary hyperparathyroidism of renal origin: Secondary | ICD-10-CM | POA: Diagnosis not present

## 2016-05-08 DIAGNOSIS — N186 End stage renal disease: Secondary | ICD-10-CM | POA: Diagnosis not present

## 2016-05-11 DIAGNOSIS — D631 Anemia in chronic kidney disease: Secondary | ICD-10-CM | POA: Diagnosis not present

## 2016-05-11 DIAGNOSIS — N2581 Secondary hyperparathyroidism of renal origin: Secondary | ICD-10-CM | POA: Diagnosis not present

## 2016-05-11 DIAGNOSIS — N186 End stage renal disease: Secondary | ICD-10-CM | POA: Diagnosis not present

## 2016-05-13 DIAGNOSIS — N186 End stage renal disease: Secondary | ICD-10-CM | POA: Diagnosis not present

## 2016-05-13 DIAGNOSIS — N2581 Secondary hyperparathyroidism of renal origin: Secondary | ICD-10-CM | POA: Diagnosis not present

## 2016-05-13 DIAGNOSIS — D631 Anemia in chronic kidney disease: Secondary | ICD-10-CM | POA: Diagnosis not present

## 2016-05-15 DIAGNOSIS — N186 End stage renal disease: Secondary | ICD-10-CM | POA: Diagnosis not present

## 2016-05-15 DIAGNOSIS — D631 Anemia in chronic kidney disease: Secondary | ICD-10-CM | POA: Diagnosis not present

## 2016-05-15 DIAGNOSIS — N2581 Secondary hyperparathyroidism of renal origin: Secondary | ICD-10-CM | POA: Diagnosis not present

## 2016-05-18 DIAGNOSIS — D631 Anemia in chronic kidney disease: Secondary | ICD-10-CM | POA: Diagnosis not present

## 2016-05-18 DIAGNOSIS — N2581 Secondary hyperparathyroidism of renal origin: Secondary | ICD-10-CM | POA: Diagnosis not present

## 2016-05-18 DIAGNOSIS — N186 End stage renal disease: Secondary | ICD-10-CM | POA: Diagnosis not present

## 2016-05-19 ENCOUNTER — Encounter: Payer: Self-pay | Admitting: Internal Medicine

## 2016-05-20 DIAGNOSIS — D631 Anemia in chronic kidney disease: Secondary | ICD-10-CM | POA: Diagnosis not present

## 2016-05-20 DIAGNOSIS — N2581 Secondary hyperparathyroidism of renal origin: Secondary | ICD-10-CM | POA: Diagnosis not present

## 2016-05-20 DIAGNOSIS — N186 End stage renal disease: Secondary | ICD-10-CM | POA: Diagnosis not present

## 2016-05-22 ENCOUNTER — Telehealth: Payer: Self-pay | Admitting: Cardiovascular Disease

## 2016-05-22 DIAGNOSIS — N2581 Secondary hyperparathyroidism of renal origin: Secondary | ICD-10-CM | POA: Diagnosis not present

## 2016-05-22 DIAGNOSIS — N186 End stage renal disease: Secondary | ICD-10-CM | POA: Diagnosis not present

## 2016-05-22 DIAGNOSIS — D631 Anemia in chronic kidney disease: Secondary | ICD-10-CM | POA: Diagnosis not present

## 2016-05-22 NOTE — Telephone Encounter (Signed)
Patient is due for fu carotid.  She wanted to wait and see [place] prior to scheduling   OV on 5/3 says no further testing   Does she need to schedule this test?

## 2016-05-22 NOTE — Telephone Encounter (Signed)
Her last carotid was in Sept 2017, ordered by Dr. Posey Pronto.  Based on those results, she should have this checked regularly, so I recommend she proceed w/ scheduling.

## 2016-05-25 DIAGNOSIS — N2581 Secondary hyperparathyroidism of renal origin: Secondary | ICD-10-CM | POA: Diagnosis not present

## 2016-05-25 DIAGNOSIS — N186 End stage renal disease: Secondary | ICD-10-CM | POA: Diagnosis not present

## 2016-05-25 DIAGNOSIS — D631 Anemia in chronic kidney disease: Secondary | ICD-10-CM | POA: Diagnosis not present

## 2016-05-27 DIAGNOSIS — N2581 Secondary hyperparathyroidism of renal origin: Secondary | ICD-10-CM | POA: Diagnosis not present

## 2016-05-27 DIAGNOSIS — N186 End stage renal disease: Secondary | ICD-10-CM | POA: Diagnosis not present

## 2016-05-27 DIAGNOSIS — D631 Anemia in chronic kidney disease: Secondary | ICD-10-CM | POA: Diagnosis not present

## 2016-05-28 DIAGNOSIS — I871 Compression of vein: Secondary | ICD-10-CM | POA: Diagnosis not present

## 2016-05-28 DIAGNOSIS — Z992 Dependence on renal dialysis: Secondary | ICD-10-CM | POA: Diagnosis not present

## 2016-05-28 DIAGNOSIS — T82858A Stenosis of vascular prosthetic devices, implants and grafts, initial encounter: Secondary | ICD-10-CM | POA: Diagnosis not present

## 2016-05-28 DIAGNOSIS — N186 End stage renal disease: Secondary | ICD-10-CM | POA: Diagnosis not present

## 2016-05-29 DIAGNOSIS — D631 Anemia in chronic kidney disease: Secondary | ICD-10-CM | POA: Diagnosis not present

## 2016-05-29 DIAGNOSIS — N2581 Secondary hyperparathyroidism of renal origin: Secondary | ICD-10-CM | POA: Diagnosis not present

## 2016-05-29 DIAGNOSIS — N186 End stage renal disease: Secondary | ICD-10-CM | POA: Diagnosis not present

## 2016-06-01 DIAGNOSIS — N186 End stage renal disease: Secondary | ICD-10-CM | POA: Diagnosis not present

## 2016-06-01 DIAGNOSIS — D631 Anemia in chronic kidney disease: Secondary | ICD-10-CM | POA: Diagnosis not present

## 2016-06-01 DIAGNOSIS — N2581 Secondary hyperparathyroidism of renal origin: Secondary | ICD-10-CM | POA: Diagnosis not present

## 2016-06-03 ENCOUNTER — Telehealth: Payer: Self-pay

## 2016-06-03 DIAGNOSIS — D631 Anemia in chronic kidney disease: Secondary | ICD-10-CM | POA: Diagnosis not present

## 2016-06-03 DIAGNOSIS — N2581 Secondary hyperparathyroidism of renal origin: Secondary | ICD-10-CM | POA: Diagnosis not present

## 2016-06-03 DIAGNOSIS — N186 End stage renal disease: Secondary | ICD-10-CM | POA: Diagnosis not present

## 2016-06-03 NOTE — Telephone Encounter (Signed)
Lmov.  3 attempts to schedule fu carotid appt from recall list. Mailed letter

## 2016-06-04 DIAGNOSIS — Z992 Dependence on renal dialysis: Secondary | ICD-10-CM | POA: Diagnosis not present

## 2016-06-04 DIAGNOSIS — N186 End stage renal disease: Secondary | ICD-10-CM | POA: Diagnosis not present

## 2016-06-04 DIAGNOSIS — I120 Hypertensive chronic kidney disease with stage 5 chronic kidney disease or end stage renal disease: Secondary | ICD-10-CM | POA: Diagnosis not present

## 2016-06-05 DIAGNOSIS — N186 End stage renal disease: Secondary | ICD-10-CM | POA: Diagnosis not present

## 2016-06-05 DIAGNOSIS — N2581 Secondary hyperparathyroidism of renal origin: Secondary | ICD-10-CM | POA: Diagnosis not present

## 2016-06-05 DIAGNOSIS — E119 Type 2 diabetes mellitus without complications: Secondary | ICD-10-CM | POA: Diagnosis not present

## 2016-06-05 DIAGNOSIS — D631 Anemia in chronic kidney disease: Secondary | ICD-10-CM | POA: Diagnosis not present

## 2016-06-08 DIAGNOSIS — N186 End stage renal disease: Secondary | ICD-10-CM | POA: Diagnosis not present

## 2016-06-08 DIAGNOSIS — E119 Type 2 diabetes mellitus without complications: Secondary | ICD-10-CM | POA: Diagnosis not present

## 2016-06-08 DIAGNOSIS — N2581 Secondary hyperparathyroidism of renal origin: Secondary | ICD-10-CM | POA: Diagnosis not present

## 2016-06-08 DIAGNOSIS — D631 Anemia in chronic kidney disease: Secondary | ICD-10-CM | POA: Diagnosis not present

## 2016-06-10 DIAGNOSIS — E119 Type 2 diabetes mellitus without complications: Secondary | ICD-10-CM | POA: Diagnosis not present

## 2016-06-10 DIAGNOSIS — D631 Anemia in chronic kidney disease: Secondary | ICD-10-CM | POA: Diagnosis not present

## 2016-06-10 DIAGNOSIS — N186 End stage renal disease: Secondary | ICD-10-CM | POA: Diagnosis not present

## 2016-06-10 DIAGNOSIS — N2581 Secondary hyperparathyroidism of renal origin: Secondary | ICD-10-CM | POA: Diagnosis not present

## 2016-06-12 DIAGNOSIS — E119 Type 2 diabetes mellitus without complications: Secondary | ICD-10-CM | POA: Diagnosis not present

## 2016-06-12 DIAGNOSIS — N2581 Secondary hyperparathyroidism of renal origin: Secondary | ICD-10-CM | POA: Diagnosis not present

## 2016-06-12 DIAGNOSIS — D631 Anemia in chronic kidney disease: Secondary | ICD-10-CM | POA: Diagnosis not present

## 2016-06-12 DIAGNOSIS — N186 End stage renal disease: Secondary | ICD-10-CM | POA: Diagnosis not present

## 2016-06-15 DIAGNOSIS — N2581 Secondary hyperparathyroidism of renal origin: Secondary | ICD-10-CM | POA: Diagnosis not present

## 2016-06-15 DIAGNOSIS — N186 End stage renal disease: Secondary | ICD-10-CM | POA: Diagnosis not present

## 2016-06-15 DIAGNOSIS — E119 Type 2 diabetes mellitus without complications: Secondary | ICD-10-CM | POA: Diagnosis not present

## 2016-06-15 DIAGNOSIS — D631 Anemia in chronic kidney disease: Secondary | ICD-10-CM | POA: Diagnosis not present

## 2016-06-17 DIAGNOSIS — D631 Anemia in chronic kidney disease: Secondary | ICD-10-CM | POA: Diagnosis not present

## 2016-06-17 DIAGNOSIS — N2581 Secondary hyperparathyroidism of renal origin: Secondary | ICD-10-CM | POA: Diagnosis not present

## 2016-06-17 DIAGNOSIS — E119 Type 2 diabetes mellitus without complications: Secondary | ICD-10-CM | POA: Diagnosis not present

## 2016-06-17 DIAGNOSIS — N186 End stage renal disease: Secondary | ICD-10-CM | POA: Diagnosis not present

## 2016-06-19 DIAGNOSIS — D631 Anemia in chronic kidney disease: Secondary | ICD-10-CM | POA: Diagnosis not present

## 2016-06-19 DIAGNOSIS — E119 Type 2 diabetes mellitus without complications: Secondary | ICD-10-CM | POA: Diagnosis not present

## 2016-06-19 DIAGNOSIS — N186 End stage renal disease: Secondary | ICD-10-CM | POA: Diagnosis not present

## 2016-06-19 DIAGNOSIS — N2581 Secondary hyperparathyroidism of renal origin: Secondary | ICD-10-CM | POA: Diagnosis not present

## 2016-06-22 DIAGNOSIS — E119 Type 2 diabetes mellitus without complications: Secondary | ICD-10-CM | POA: Diagnosis not present

## 2016-06-22 DIAGNOSIS — D631 Anemia in chronic kidney disease: Secondary | ICD-10-CM | POA: Diagnosis not present

## 2016-06-22 DIAGNOSIS — N186 End stage renal disease: Secondary | ICD-10-CM | POA: Diagnosis not present

## 2016-06-22 DIAGNOSIS — N2581 Secondary hyperparathyroidism of renal origin: Secondary | ICD-10-CM | POA: Diagnosis not present

## 2016-06-24 DIAGNOSIS — E119 Type 2 diabetes mellitus without complications: Secondary | ICD-10-CM | POA: Diagnosis not present

## 2016-06-24 DIAGNOSIS — D631 Anemia in chronic kidney disease: Secondary | ICD-10-CM | POA: Diagnosis not present

## 2016-06-24 DIAGNOSIS — N186 End stage renal disease: Secondary | ICD-10-CM | POA: Diagnosis not present

## 2016-06-24 DIAGNOSIS — N2581 Secondary hyperparathyroidism of renal origin: Secondary | ICD-10-CM | POA: Diagnosis not present

## 2016-06-25 ENCOUNTER — Other Ambulatory Visit: Payer: Self-pay | Admitting: Cardiovascular Disease

## 2016-06-25 DIAGNOSIS — I6523 Occlusion and stenosis of bilateral carotid arteries: Secondary | ICD-10-CM

## 2016-06-26 DIAGNOSIS — D631 Anemia in chronic kidney disease: Secondary | ICD-10-CM | POA: Diagnosis not present

## 2016-06-26 DIAGNOSIS — N2581 Secondary hyperparathyroidism of renal origin: Secondary | ICD-10-CM | POA: Diagnosis not present

## 2016-06-26 DIAGNOSIS — N186 End stage renal disease: Secondary | ICD-10-CM | POA: Diagnosis not present

## 2016-06-26 DIAGNOSIS — E119 Type 2 diabetes mellitus without complications: Secondary | ICD-10-CM | POA: Diagnosis not present

## 2016-06-29 DIAGNOSIS — N2581 Secondary hyperparathyroidism of renal origin: Secondary | ICD-10-CM | POA: Diagnosis not present

## 2016-06-29 DIAGNOSIS — E119 Type 2 diabetes mellitus without complications: Secondary | ICD-10-CM | POA: Diagnosis not present

## 2016-06-29 DIAGNOSIS — N186 End stage renal disease: Secondary | ICD-10-CM | POA: Diagnosis not present

## 2016-06-29 DIAGNOSIS — D631 Anemia in chronic kidney disease: Secondary | ICD-10-CM | POA: Diagnosis not present

## 2016-07-01 DIAGNOSIS — D631 Anemia in chronic kidney disease: Secondary | ICD-10-CM | POA: Diagnosis not present

## 2016-07-01 DIAGNOSIS — N186 End stage renal disease: Secondary | ICD-10-CM | POA: Diagnosis not present

## 2016-07-01 DIAGNOSIS — N2581 Secondary hyperparathyroidism of renal origin: Secondary | ICD-10-CM | POA: Diagnosis not present

## 2016-07-01 DIAGNOSIS — E119 Type 2 diabetes mellitus without complications: Secondary | ICD-10-CM | POA: Diagnosis not present

## 2016-07-03 DIAGNOSIS — D631 Anemia in chronic kidney disease: Secondary | ICD-10-CM | POA: Diagnosis not present

## 2016-07-03 DIAGNOSIS — N2581 Secondary hyperparathyroidism of renal origin: Secondary | ICD-10-CM | POA: Diagnosis not present

## 2016-07-03 DIAGNOSIS — E119 Type 2 diabetes mellitus without complications: Secondary | ICD-10-CM | POA: Diagnosis not present

## 2016-07-03 DIAGNOSIS — N186 End stage renal disease: Secondary | ICD-10-CM | POA: Diagnosis not present

## 2016-07-04 DIAGNOSIS — I120 Hypertensive chronic kidney disease with stage 5 chronic kidney disease or end stage renal disease: Secondary | ICD-10-CM | POA: Diagnosis not present

## 2016-07-04 DIAGNOSIS — Z992 Dependence on renal dialysis: Secondary | ICD-10-CM | POA: Diagnosis not present

## 2016-07-04 DIAGNOSIS — N186 End stage renal disease: Secondary | ICD-10-CM | POA: Diagnosis not present

## 2016-07-06 ENCOUNTER — Other Ambulatory Visit: Payer: Self-pay | Admitting: Internal Medicine

## 2016-07-06 DIAGNOSIS — D631 Anemia in chronic kidney disease: Secondary | ICD-10-CM | POA: Diagnosis not present

## 2016-07-06 DIAGNOSIS — N2581 Secondary hyperparathyroidism of renal origin: Secondary | ICD-10-CM | POA: Diagnosis not present

## 2016-07-06 DIAGNOSIS — N186 End stage renal disease: Secondary | ICD-10-CM | POA: Diagnosis not present

## 2016-07-08 ENCOUNTER — Emergency Department
Admission: EM | Admit: 2016-07-08 | Discharge: 2016-07-08 | Disposition: A | Discharge status: Home / Self Care | Payer: Medicare Other | Attending: Emergency Medicine | Admitting: Emergency Medicine

## 2016-07-08 ENCOUNTER — Encounter: Payer: Self-pay | Admitting: *Deleted

## 2016-07-08 DIAGNOSIS — Z79899 Other long term (current) drug therapy: Secondary | ICD-10-CM | POA: Insufficient documentation

## 2016-07-08 DIAGNOSIS — R58 Hemorrhage, not elsewhere classified: Secondary | ICD-10-CM | POA: Diagnosis not present

## 2016-07-08 DIAGNOSIS — T82838A Hemorrhage of vascular prosthetic devices, implants and grafts, initial encounter: Secondary | ICD-10-CM | POA: Diagnosis not present

## 2016-07-08 DIAGNOSIS — N186 End stage renal disease: Secondary | ICD-10-CM | POA: Diagnosis not present

## 2016-07-08 DIAGNOSIS — N2581 Secondary hyperparathyroidism of renal origin: Secondary | ICD-10-CM | POA: Diagnosis not present

## 2016-07-08 DIAGNOSIS — Z7982 Long term (current) use of aspirin: Secondary | ICD-10-CM | POA: Insufficient documentation

## 2016-07-08 DIAGNOSIS — I2510 Atherosclerotic heart disease of native coronary artery without angina pectoris: Secondary | ICD-10-CM | POA: Diagnosis not present

## 2016-07-08 DIAGNOSIS — E119 Type 2 diabetes mellitus without complications: Secondary | ICD-10-CM | POA: Diagnosis not present

## 2016-07-08 DIAGNOSIS — Y69 Unspecified misadventure during surgical and medical care: Secondary | ICD-10-CM | POA: Diagnosis not present

## 2016-07-08 DIAGNOSIS — E079 Disorder of thyroid, unspecified: Secondary | ICD-10-CM | POA: Diagnosis not present

## 2016-07-08 DIAGNOSIS — Z87891 Personal history of nicotine dependence: Secondary | ICD-10-CM | POA: Diagnosis not present

## 2016-07-08 DIAGNOSIS — Z7902 Long term (current) use of antithrombotics/antiplatelets: Secondary | ICD-10-CM | POA: Diagnosis not present

## 2016-07-08 DIAGNOSIS — I119 Hypertensive heart disease without heart failure: Secondary | ICD-10-CM | POA: Diagnosis not present

## 2016-07-08 DIAGNOSIS — D631 Anemia in chronic kidney disease: Secondary | ICD-10-CM | POA: Diagnosis not present

## 2016-07-08 DIAGNOSIS — Z452 Encounter for adjustment and management of vascular access device: Secondary | ICD-10-CM | POA: Diagnosis present

## 2016-07-08 LAB — COMPREHENSIVE METABOLIC PANEL
ALT: 10 U/L — ABNORMAL LOW (ref 14–54)
AST: 23 U/L (ref 15–41)
Albumin: 4 g/dL (ref 3.5–5.0)
Alkaline Phosphatase: 153 U/L — ABNORMAL HIGH (ref 38–126)
Anion gap: 10 (ref 5–15)
BILIRUBIN TOTAL: 0.7 mg/dL (ref 0.3–1.2)
BUN: 13 mg/dL (ref 6–20)
CALCIUM: 8.6 mg/dL — AB (ref 8.9–10.3)
CO2: 29 mmol/L (ref 22–32)
CREATININE: 2.39 mg/dL — AB (ref 0.44–1.00)
Chloride: 96 mmol/L — ABNORMAL LOW (ref 101–111)
GFR calc Af Amer: 19 mL/min — ABNORMAL LOW (ref >60)
GFR calc non Af Amer: 16 mL/min — ABNORMAL LOW (ref >60)
Glucose, Bld: 131 mg/dL — ABNORMAL HIGH (ref 65–99)
POTASSIUM: 3.8 mmol/L (ref 3.5–5.1)
Sodium: 135 mmol/L (ref 135–145)
TOTAL PROTEIN: 7.8 g/dL (ref 6.5–8.1)

## 2016-07-08 LAB — CBC WITH DIFFERENTIAL/PLATELET
BASOS ABS: 0.1 10*3/uL (ref 0–0.1)
Basophils Relative: 1 %
Eosinophils Absolute: 0.4 10*3/uL (ref 0–0.7)
Eosinophils Relative: 7 %
HEMATOCRIT: 39.8 % (ref 35.0–47.0)
Hemoglobin: 13 g/dL (ref 12.0–16.0)
LYMPHS PCT: 15 %
Lymphs Abs: 0.8 10*3/uL — ABNORMAL LOW (ref 1.0–3.6)
MCH: 31.9 pg (ref 26.0–34.0)
MCHC: 32.6 g/dL (ref 32.0–36.0)
MCV: 97.9 fL (ref 80.0–100.0)
MONO ABS: 0.7 10*3/uL (ref 0.2–0.9)
MONOS PCT: 13 %
NEUTROS ABS: 3.4 10*3/uL (ref 1.4–6.5)
Neutrophils Relative %: 64 %
Platelets: 144 10*3/uL — ABNORMAL LOW (ref 150–440)
RBC: 4.07 MIL/uL (ref 3.80–5.20)
RDW: 17.5 % — AB (ref 11.5–14.5)
WBC: 5.3 10*3/uL (ref 3.6–11.0)

## 2016-07-08 MED ORDER — LIDOCAINE HCL (PF) 1 % IJ SOLN
INTRAMUSCULAR | Status: AC
  Administered 2016-07-08: 5 mL via INTRADERMAL
  Filled 2016-07-08: qty 5

## 2016-07-08 MED ORDER — LIDOCAINE HCL 1 % IJ SOLN
5.0000 mL | Freq: Once | INTRAMUSCULAR | Status: AC
  Administered 2016-07-08: 5 mL via INTRADERMAL

## 2016-07-08 NOTE — ED Triage Notes (Signed)
Pt arrives via EMS from dialysis, pt received full tx today and when right arm fistula was being deaccseded it began to bleed, dialysis states they were unable to stop bleeding, EMS arrives holding pressure, EDP at bedside

## 2016-07-08 NOTE — Discharge Instructions (Signed)
Please return at once for any further bleeding. Please follow-up with [facility] vascular. Call them first thing in the morning they might want to see her tomorrow they will check on that area and see when the stitch can come out. Please also return if you have any loss of the thrill and that shunt.

## 2016-07-08 NOTE — ED Provider Notes (Signed)
[facility] Emergency Department Provider Note   ____________________________________________   First MD Initiated Contact with Patient 07/08/16 1604     (approximate)  I have reviewed the triage vital signs and the nursing notes.   HISTORY  Chief Complaint Vascular Access Problem    HPI [name] is a 83 y.o. female who was getting dialysis today when they D accessed her she wouldn't stop bleeding. Patient comes to emergency room and is still bleeding from the hole in her shunt. Surgicel was applied pressure was applied to that we'll see what happens next. Patient reports she's had this once before several years ago.   Past Medical History:  Diagnosis Date  . Anemia    NOS  . Anxiety   . Aortic stenosis   . Cancer [facility])    Mole on top of head to be removed in July 2013  . CHF (congestive heart failure) ([place])   . Constipation   . Coronary artery disease   . Diabetes mellitus    type II;was on Glipizide but has been off x 19mon  . Dialysis patient ([place])   . Diarrhea   . GERD (gastroesophageal reflux disease)   . History of blood transfusion   . Hyperlipidemia    takes Simvastatin nightly  . Hyperlipidemia   . Hypertension    takes Metoprolol daily  . Hypothyroidism    takes Synthroid daily  . Migraine    hx of  . Oligouria   . Osteoarthritis   . Osteopenia   . Peripheral vascular disease ([place])   . Personal history of colonic polyps   . Pulmonary hypertension ([place])   . Renal insufficiency   . Urinary incontinence   . UTI (urinary tract infection)     Patient Active Problem List   Diagnosis Date Noted  . Cerebrovascular accident (CVA) due to occlusion of right middle cerebral artery ([place]) 10/24/2015  . Malnutrition of mild degree ([place]) 10/24/2015  . Altered mental status 09/18/2015  . Preventative health care 05/03/2014  . Peripheral vascular disease ([place]) 12/21/2013  . Toe amputation status ([place]) 08/29/2013  . History of  recent fall 04/18/2013  . HTN (hypertension), malignant 03/22/2013  . S/P aortic valve replacement with bioprosthetic valve 11/15/2012  . S/P CABG x 3 11/15/2012  . Anemia of chronic kidney failure 12/22/2010  . Thrombocytopenia ([place]) 12/13/2010  . End stage renal disease on dialysis ([place]) 12/13/2010  . S/P aortic valve replacement 08/11/2010  . Arterial insufficiency-lower 07/25/2010  . NEURODERMATITIS 04/08/2009  . Carotid arterial disease ([place]) 01/01/2009  . Coronary atherosclerosis 02/11/2007  . AORTIC STENOSIS 10/04/2006  . ANXIETY 05/30/2006  . Pulmonary hypertension ([place]) 05/30/2006  . Hypothyroidism 05/27/2006  . Type 2 diabetes mellitus with renal manifestations, controlled ([place]) 05/27/2006  . Hyperlipidemia 05/27/2006  . Essential hypertension 05/27/2006  . GERD 05/27/2006  . OSTEOARTHRITIS 05/27/2006  . OSTEOPENIA 05/27/2006  . URINARY INCONTINENCE 05/27/2006    Past Surgical History:  Procedure Laterality Date  . ABDOMINAL HYSTERECTOMY    . adenosine myoview  2007   benign, EF 69%  . AMPUTATION  06/18/2011   Procedure: AMPUTATION DIGIT;  Surgeon: Newt Minion, MD;  Location: [facility];  Service: Orthopedics;  Laterality: Left;  Left 2nd Toe Amputation at MTP Joint  . Amputation-left great toe  7/12   Dr Marlou Sa  . AORTIC VALVE REPLACEMENT  2009  . APPENDECTOMY    . AV FISTULA PLACEMENT  12/22/2010   Procedure: ARTERIOVENOUS (AV) FISTULA CREATION;  Surgeon: Hinda Lenis, MD;  Location: [facility];  Service: Vascular;  Laterality: Left;  Creation of Left Brachial-Cephalic Fistula  . CARDIAC CATHETERIZATION  2000   cad  . CAROTID ENDARTERECTOMY  2011   Right  . CATARACT EXTRACTION    . CORONARY ARTERY BYPASS GRAFT     od  . CORONARY ARTERY BYPASS GRAFT  2009  . EYE SURGERY     BIlateral  . INSERTION OF DIALYSIS CATHETER  12/18/2010   Procedure: INSERTION OF DIALYSIS CATHETER;  Surgeon: Mal Misty, MD;  Location: [facility];  Service: Vascular;  Laterality: Right;    . REPLACEMENT TOTAL KNEE BILATERAL  05/1998  . TONSILLECTOMY    . Traumatic Amputation of Right DIP joint of Index Finger      Prior to Admission medications   Medication Sig Start Date End Date Taking? Authorizing Provider  amLODipine (NORVASC) 5 MG tablet Take 1 tablet (5 mg total) by mouth daily. 11/12/15   [name], MD  aspirin EC 81 MG tablet Take 81 mg by mouth at bedtime.    [provider]  calcium carbonate (TUMS - DOSED IN MG ELEMENTAL CALCIUM) 500 MG chewable tablet Chew 1 tablet (200 mg of elemental calcium total) by mouth 3 (three) times daily with meals. 12/06/12   Geradine Girt, DO  cloNIDine (CATAPRES) 0.1 MG tablet Take 1 tablet (0.1 mg total) by mouth 2 (two) times daily. 02/13/16   [name], MD  clopidogrel (PLAVIX) 75 MG tablet Take 1 tablet (75 mg total) by mouth daily. 01/20/16   [name], MD  folic acid (FOLVITE) 0.5 MG tablet Take 0.5 tablets (0.5 mg total) by mouth daily. 12/06/12   Geradine Girt, DO  hydrALAZINE (APRESOLINE) 25 MG tablet TAKE ONE TABLET BY MOUTH EVERY 8 HOURS. 04/01/16   [name], MD  levothyroxine (SYNTHROID, LEVOTHROID) 75 MCG tablet TAKE ONE TABLET BY MOUTH ONCE DAILY 07/07/16   [name], MD  metoprolol (LOPRESSOR) 50 MG tablet Take 1 tablet (50 mg total) by mouth 2 (two) times daily. 11/25/15   Minna Merritts, MD  polyvinyl alcohol (LIQUIFILM TEARS) 1.4 % ophthalmic solution Place 1 drop into both eyes as needed for dry eyes. 09/22/15   Fritzi Mandes, MD  pravastatin (PRAVACHOL) 40 MG tablet Take 1 tablet (40 mg total) by mouth every evening. 05/07/15   Minna Merritts, MD  vitamin C (VITAMIN C) 500 MG tablet Take 1 tablet (500 mg total) by mouth daily. 12/06/12   Geradine Girt, DO    Allergies Nitrofurantoin; Captopril; Enalapril maleate; Ramipril; Sulfa antibiotics; and Verapamil  Family History  Problem Relation Age of Onset  . Family history unknown: Yes    Social History Social  History  Substance Use Topics  . Smoking status: Former Smoker    Years: 15.00    Types: Cigarettes    Quit date: 01/06/1943  . Smokeless tobacco: Never Used  . Alcohol use Yes     Comment: Wine occasionally    Review of Systems  Constitutional: No fever/chills Eyes: No visual changes. ENT: No sore throat. Cardiovascular: Denies chest pain. Respiratory: Denies shortness of breath. Gastrointestinal: No abdominal pain.  No nausea, no vomiting.  No diarrhea.  No constipation. Genitourinary: Negative for dysuria. Musculoskeletal: Negative for back pain. Skin: Negative for rash. Neurological: Negative for headaches, focal weakness   ____________________________________________   PHYSICAL EXAM:  VITAL SIGNS: ED Triage Vitals  Enc Vitals Group  BP      Pulse      Resp      Temp      Temp src      SpO2      Weight      Height      Head Circumference      Peak Flow      Pain Score      Pain Loc      Pain Edu?      Excl. in [place]?    Constitutional: Alert and oriented. Well appearing and in no acute distress. Eyes: Conjunctivae are normal.  Head: Atraumatic. Nose: No congestion/rhinnorhea. Mouth/Throat: Mucous membranes are moist.  Oropharynx non-erythematous. Neck: No stridor.   Cardiovascular: Normal rate, regular rhythm. Grossly normal heart sounds.  Good peripheral circulation. Respiratory: Normal respiratory effort.  No retractions. Lungs CTAB. Gastrointestinal: Soft and nontender. No distention. No abdominal bruits. No CVA tenderness. }Musculoskeletal: No lower extremity tenderness nor edema.  No joint effusions. Extremities left extremity has a shunt that shunt is bleeding profusely under high pressure not controlled by putting pressure on the shunt itself area was cleaned and injected with 1% lidocaine and 1 figure-of-eight stitch was placed superficially around the bleeding area and this controlled the bleeding immediately and the thrill was remaining in the  shunt both proximal and distal to these figure-of-eight stitch. All are rated wel  ____________________________________________   LABS (all labs ordered are listed, but only abnormal results are displayed)  ____________________________________________  EKG   ____________________________________________  RADIOLOGY   ____________________________________________   PROCEDURES  Procedure(s) performed:   Procedures  Critical Care performed:   ____________________________________________   INITIAL IMPRESSION / ASSESSMENT AND PLAN / ED COURSE  Pertinent labs & imaging results that were available during my care of the patient were reviewed by me and considered in my medical decision making (see chart for details).   Discussed patient with vascular surgery on call they will follow-up recommend tomorrow.     ____________________________________________   FINAL CLINICAL IMPRESSION(S) / ED DIAGNOSES  Final diagnoses:  Bleeding      NEW MEDICATIONS STARTED DURING THIS VISIT:  New Prescriptions   No medications on file     Note:  This document was prepared using Dragon voice recognition software and may include unintentional dictation errors.    Nena Polio, MD 07/08/16 [phone]

## 2016-07-08 NOTE — ED Notes (Signed)
Dr. Cinda Quest placed suture, thrill palpated after suture placement

## 2016-07-09 ENCOUNTER — Ambulatory Visit (INDEPENDENT_AMBULATORY_CARE_PROVIDER_SITE_OTHER): Payer: Medicare Other | Admitting: Orthopedic Surgery

## 2016-07-10 DIAGNOSIS — N2581 Secondary hyperparathyroidism of renal origin: Secondary | ICD-10-CM | POA: Diagnosis not present

## 2016-07-10 DIAGNOSIS — D631 Anemia in chronic kidney disease: Secondary | ICD-10-CM | POA: Diagnosis not present

## 2016-07-10 DIAGNOSIS — N186 End stage renal disease: Secondary | ICD-10-CM | POA: Diagnosis not present

## 2016-07-13 DIAGNOSIS — D631 Anemia in chronic kidney disease: Secondary | ICD-10-CM | POA: Diagnosis not present

## 2016-07-13 DIAGNOSIS — N2581 Secondary hyperparathyroidism of renal origin: Secondary | ICD-10-CM | POA: Diagnosis not present

## 2016-07-13 DIAGNOSIS — N186 End stage renal disease: Secondary | ICD-10-CM | POA: Diagnosis not present

## 2016-07-15 DIAGNOSIS — D631 Anemia in chronic kidney disease: Secondary | ICD-10-CM | POA: Diagnosis not present

## 2016-07-15 DIAGNOSIS — N2581 Secondary hyperparathyroidism of renal origin: Secondary | ICD-10-CM | POA: Diagnosis not present

## 2016-07-15 DIAGNOSIS — N186 End stage renal disease: Secondary | ICD-10-CM | POA: Diagnosis not present

## 2016-07-16 ENCOUNTER — Ambulatory Visit: Payer: Medicare Other

## 2016-07-16 DIAGNOSIS — I6523 Occlusion and stenosis of bilateral carotid arteries: Secondary | ICD-10-CM

## 2016-07-17 DIAGNOSIS — N186 End stage renal disease: Secondary | ICD-10-CM | POA: Diagnosis not present

## 2016-07-17 DIAGNOSIS — D631 Anemia in chronic kidney disease: Secondary | ICD-10-CM | POA: Diagnosis not present

## 2016-07-17 DIAGNOSIS — N2581 Secondary hyperparathyroidism of renal origin: Secondary | ICD-10-CM | POA: Diagnosis not present

## 2016-07-20 DIAGNOSIS — D631 Anemia in chronic kidney disease: Secondary | ICD-10-CM | POA: Diagnosis not present

## 2016-07-20 DIAGNOSIS — N186 End stage renal disease: Secondary | ICD-10-CM | POA: Diagnosis not present

## 2016-07-20 DIAGNOSIS — N2581 Secondary hyperparathyroidism of renal origin: Secondary | ICD-10-CM | POA: Diagnosis not present

## 2016-07-21 ENCOUNTER — Other Ambulatory Visit: Payer: Self-pay | Admitting: Cardiovascular Disease

## 2016-07-22 ENCOUNTER — Ambulatory Visit: Payer: Medicare Other | Admitting: Family

## 2016-07-22 DIAGNOSIS — N2581 Secondary hyperparathyroidism of renal origin: Secondary | ICD-10-CM | POA: Diagnosis not present

## 2016-07-22 DIAGNOSIS — N186 End stage renal disease: Secondary | ICD-10-CM | POA: Diagnosis not present

## 2016-07-22 DIAGNOSIS — D631 Anemia in chronic kidney disease: Secondary | ICD-10-CM | POA: Diagnosis not present

## 2016-07-24 DIAGNOSIS — D631 Anemia in chronic kidney disease: Secondary | ICD-10-CM | POA: Diagnosis not present

## 2016-07-24 DIAGNOSIS — N186 End stage renal disease: Secondary | ICD-10-CM | POA: Diagnosis not present

## 2016-07-24 DIAGNOSIS — N2581 Secondary hyperparathyroidism of renal origin: Secondary | ICD-10-CM | POA: Diagnosis not present

## 2016-07-27 DIAGNOSIS — N186 End stage renal disease: Secondary | ICD-10-CM | POA: Diagnosis not present

## 2016-07-27 DIAGNOSIS — N2581 Secondary hyperparathyroidism of renal origin: Secondary | ICD-10-CM | POA: Diagnosis not present

## 2016-07-27 DIAGNOSIS — D631 Anemia in chronic kidney disease: Secondary | ICD-10-CM | POA: Diagnosis not present

## 2016-07-29 DIAGNOSIS — D631 Anemia in chronic kidney disease: Secondary | ICD-10-CM | POA: Diagnosis not present

## 2016-07-29 DIAGNOSIS — N2581 Secondary hyperparathyroidism of renal origin: Secondary | ICD-10-CM | POA: Diagnosis not present

## 2016-07-29 DIAGNOSIS — N186 End stage renal disease: Secondary | ICD-10-CM | POA: Diagnosis not present

## 2016-07-31 DIAGNOSIS — D631 Anemia in chronic kidney disease: Secondary | ICD-10-CM | POA: Diagnosis not present

## 2016-07-31 DIAGNOSIS — N2581 Secondary hyperparathyroidism of renal origin: Secondary | ICD-10-CM | POA: Diagnosis not present

## 2016-07-31 DIAGNOSIS — N186 End stage renal disease: Secondary | ICD-10-CM | POA: Diagnosis not present

## 2016-08-03 DIAGNOSIS — N186 End stage renal disease: Secondary | ICD-10-CM | POA: Diagnosis not present

## 2016-08-03 DIAGNOSIS — D631 Anemia in chronic kidney disease: Secondary | ICD-10-CM | POA: Diagnosis not present

## 2016-08-03 DIAGNOSIS — N2581 Secondary hyperparathyroidism of renal origin: Secondary | ICD-10-CM | POA: Diagnosis not present

## 2016-08-04 DIAGNOSIS — N186 End stage renal disease: Secondary | ICD-10-CM | POA: Diagnosis not present

## 2016-08-04 DIAGNOSIS — Z992 Dependence on renal dialysis: Secondary | ICD-10-CM | POA: Diagnosis not present

## 2016-08-04 DIAGNOSIS — I120 Hypertensive chronic kidney disease with stage 5 chronic kidney disease or end stage renal disease: Secondary | ICD-10-CM | POA: Diagnosis not present

## 2016-08-05 DIAGNOSIS — N186 End stage renal disease: Secondary | ICD-10-CM | POA: Diagnosis not present

## 2016-08-05 DIAGNOSIS — N2581 Secondary hyperparathyroidism of renal origin: Secondary | ICD-10-CM | POA: Diagnosis not present

## 2016-08-05 DIAGNOSIS — D631 Anemia in chronic kidney disease: Secondary | ICD-10-CM | POA: Diagnosis not present

## 2016-08-07 DIAGNOSIS — N2581 Secondary hyperparathyroidism of renal origin: Secondary | ICD-10-CM | POA: Diagnosis not present

## 2016-08-07 DIAGNOSIS — D631 Anemia in chronic kidney disease: Secondary | ICD-10-CM | POA: Diagnosis not present

## 2016-08-07 DIAGNOSIS — N186 End stage renal disease: Secondary | ICD-10-CM | POA: Diagnosis not present

## 2016-08-10 DIAGNOSIS — N186 End stage renal disease: Secondary | ICD-10-CM | POA: Diagnosis not present

## 2016-08-10 DIAGNOSIS — N2581 Secondary hyperparathyroidism of renal origin: Secondary | ICD-10-CM | POA: Diagnosis not present

## 2016-08-10 DIAGNOSIS — D631 Anemia in chronic kidney disease: Secondary | ICD-10-CM | POA: Diagnosis not present

## 2016-08-12 DIAGNOSIS — N2581 Secondary hyperparathyroidism of renal origin: Secondary | ICD-10-CM | POA: Diagnosis not present

## 2016-08-12 DIAGNOSIS — N186 End stage renal disease: Secondary | ICD-10-CM | POA: Diagnosis not present

## 2016-08-12 DIAGNOSIS — D631 Anemia in chronic kidney disease: Secondary | ICD-10-CM | POA: Diagnosis not present

## 2016-08-13 ENCOUNTER — Ambulatory Visit (INDEPENDENT_AMBULATORY_CARE_PROVIDER_SITE_OTHER): Payer: Medicare Other | Admitting: Internal Medicine

## 2016-08-13 ENCOUNTER — Encounter: Payer: Self-pay | Admitting: Internal Medicine

## 2016-08-13 VITALS — BP 138/66 | HR 75 | Temp 97.7°F | Wt 113.0 lb

## 2016-08-13 DIAGNOSIS — I272 Pulmonary hypertension, unspecified: Secondary | ICD-10-CM

## 2016-08-13 DIAGNOSIS — N186 End stage renal disease: Secondary | ICD-10-CM | POA: Diagnosis not present

## 2016-08-13 DIAGNOSIS — I739 Peripheral vascular disease, unspecified: Secondary | ICD-10-CM

## 2016-08-13 DIAGNOSIS — E1121 Type 2 diabetes mellitus with diabetic nephropathy: Secondary | ICD-10-CM | POA: Diagnosis not present

## 2016-08-13 DIAGNOSIS — Z992 Dependence on renal dialysis: Secondary | ICD-10-CM | POA: Diagnosis not present

## 2016-08-13 LAB — HEMOGLOBIN A1C: HEMOGLOBIN A1C: 5.5 % (ref 4.6–6.5)

## 2016-08-13 NOTE — Assessment & Plan Note (Signed)
Probably still well controlled Will check A1c No Rx

## 2016-08-13 NOTE — Assessment & Plan Note (Signed)
Seems to be doing well on the dialysis Did have a shunt problem--but okay now

## 2016-08-13 NOTE — Assessment & Plan Note (Signed)
Mild edema but no SOB Fluid control by the dialysis

## 2016-08-13 NOTE — Progress Notes (Signed)
Subjective:    Patient ID: [name], female    DOB: July 30, 1920, 83 y.o.   MRN: [id number]  HPI Here for follow up of diabetes and other chronic medical conditions  Has brown spot on right breast Wants this checked Also spot on left cheek--called Dr [name] but hasn't heard back  Will notice her neck crack sometimes when getting up No pain  Left leg swells if up on it for a while Did have black discoloration in legs---dialysis folks thought she may have inadvertently rubbed them together (like bruising)  Still lives alone  Drives --but [name] for dialysis Dialysis is generally going okay  [facility] Did have carotid scan by Dr [name] okay No chest pain No SOB No dizziness--but uses walker for balance first thing in the morning (then just cane)  Doesn't check sugars --they do at dialysis No Rx for this  Current Outpatient Prescriptions on File Prior to Visit  Medication Sig Dispense Refill  . amLODipine (NORVASC) 5 MG tablet Take 1 tablet (5 mg total) by mouth daily. 90 tablet 3  . aspirin EC 81 MG tablet Take 81 mg by mouth at bedtime.    . calcium carbonate (TUMS - DOSED IN MG ELEMENTAL CALCIUM) 500 MG chewable tablet Chew 1 tablet (200 mg of elemental calcium total) by mouth 3 (three) times daily with meals.    . cloNIDine (CATAPRES) 0.1 MG tablet Take 1 tablet (0.1 mg total) by mouth 2 (two) times daily. 60 tablet 11  . clopidogrel (PLAVIX) 75 MG tablet Take 1 tablet (75 mg total) by mouth daily. 30 tablet 11  . folic acid (FOLVITE) 0.5 MG tablet Take 0.5 tablets (0.5 mg total) by mouth daily.    . hydrALAZINE (APRESOLINE) 25 MG tablet TAKE ONE TABLET BY MOUTH EVERY 8 HOURS. 90 tablet 0  . levothyroxine (SYNTHROID, LEVOTHROID) 75 MCG tablet TAKE ONE TABLET BY MOUTH ONCE DAILY 30 tablet 11  . metoprolol (LOPRESSOR) 50 MG tablet Take 1 tablet (50 mg total) by mouth 2 (two) times daily. 180 tablet 3  . polyvinyl alcohol (LIQUIFILM TEARS) 1.4 % ophthalmic  solution Place 1 drop into both eyes as needed for dry eyes. 15 mL 0  . pravastatin (PRAVACHOL) 40 MG tablet TAKE ONE TABLET BY MOUTH IN THE EVENING 90 tablet 4  . vitamin C (VITAMIN C) 500 MG tablet Take 1 tablet (500 mg total) by mouth daily.     No current facility-administered medications on file prior to visit.     Allergies  Allergen Reactions  . Nitrofurantoin Itching  . Captopril Other (See Comments)    REACTION: unspecified  . Enalapril Maleate Cough  . Ramipril Other (See Comments)    REACTION: unspecified  . Sulfa Antibiotics Other (See Comments)    Reaction unknown  . Verapamil Other (See Comments)    REACTION: unspecified    Past Medical History:  Diagnosis Date  . Anemia    NOS  . Anxiety   . Aortic stenosis   . Cancer [facility])    Mole on top of head to be removed in July 2013  . CHF (congestive heart failure) ([place])   . Constipation   . Coronary artery disease   . Diabetes mellitus    type II;was on Glipizide but has been off x 48mon  . Dialysis patient ([place])   . Diarrhea   . GERD (gastroesophageal reflux disease)   . History of blood transfusion   . Hyperlipidemia  takes Simvastatin nightly  . Hyperlipidemia   . Hypertension    takes Metoprolol daily  . Hypothyroidism    takes Synthroid daily  . Migraine    hx of  . Oligouria   . Osteoarthritis   . Osteopenia   . Peripheral vascular disease ([place])   . Personal history of colonic polyps   . Pulmonary hypertension ([name])   . Renal insufficiency   . Urinary incontinence   . UTI (urinary tract infection)     Past Surgical History:  Procedure Laterality Date  . ABDOMINAL HYSTERECTOMY    . adenosine myoview  2007   benign, EF 69%  . AMPUTATION  06/18/2011   Procedure: AMPUTATION DIGIT;  Surgeon: [name] Minion, MD;  Location: [facility];  Service: Orthopedics;  Laterality: Left;  Left 2nd Toe Amputation at MTP Joint  . Amputation-left great toe  7/12   Dr [name]  . AORTIC VALVE REPLACEMENT  2009  .  APPENDECTOMY    . AV FISTULA PLACEMENT  12/22/2010   Procedure: ARTERIOVENOUS (AV) FISTULA CREATION;  Surgeon: [name] Lenis, MD;  Location: [facility];  Service: Vascular;  Laterality: Left;  Creation of Left Brachial-Cephalic Fistula  . CARDIAC CATHETERIZATION  2000   cad  . CAROTID ENDARTERECTOMY  2011   Right  . CATARACT EXTRACTION    . CORONARY ARTERY BYPASS GRAFT     od  . CORONARY ARTERY BYPASS GRAFT  2009  . EYE SURGERY     BIlateral  . INSERTION OF DIALYSIS CATHETER  12/18/2010   Procedure: INSERTION OF DIALYSIS CATHETER;  Surgeon: [name] Misty, MD;  Location: [facility];  Service: Vascular;  Laterality: Right;  . REPLACEMENT TOTAL KNEE BILATERAL  05/1998  . TONSILLECTOMY    . Traumatic Amputation of Right DIP joint of Index Finger      Family History  Problem Relation Age of Onset  . Family history unknown: Yes    Social History   Social History  . Marital status: Widowed    Spouse name: [name]  . Number of children: 1  . Years of education: [name]   Occupational History  . 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 Smoking status: Former Smoker    Years: 15.00    Types: Cigarettes    Quit date: 01/06/1943  . Smokeless tobacco: Never Used  . Alcohol use Yes     Comment: Wine occasionally  . Drug use: No  . Sexual activity: No   Other Topics Concern  . Not on file   Social History Narrative   [name] to make decisions for her   Discussed DNR and she requests --done 05/03/14   No tube feeds if cognitively unaware   Review of Systems Has hearing aides (from [employer]) and is happy with them Appetite is "good sometimes) Sleeps okay--if up will just watch TV or read    Objective:   Physical Exam  Constitutional: No distress.  Cardiovascular: Normal rate and regular rhythm.  Exam reveals no gallop.   Gr 3/6 aortic systolic murmur Faint pedal pulses  Pulmonary/Chest: Effort normal and breath sounds normal. No respiratory  distress. She has no wheezes. She has no rales.  Musculoskeletal:  Trace - 1+ edema--more on left  Skin:  seb keratosis on right breast Benign lesion on left cheek Stasis changes with triangular purplish lesion on posterior left calf--not infected  Psychiatric: She has a normal mood and affect. Her behavior is normal.  Assessment & Plan:

## 2016-08-13 NOTE — Assessment & Plan Note (Signed)
No claudication or skin breakdown

## 2016-08-14 DIAGNOSIS — D631 Anemia in chronic kidney disease: Secondary | ICD-10-CM | POA: Diagnosis not present

## 2016-08-14 DIAGNOSIS — N2581 Secondary hyperparathyroidism of renal origin: Secondary | ICD-10-CM | POA: Diagnosis not present

## 2016-08-14 DIAGNOSIS — N186 End stage renal disease: Secondary | ICD-10-CM | POA: Diagnosis not present

## 2016-08-17 DIAGNOSIS — N2581 Secondary hyperparathyroidism of renal origin: Secondary | ICD-10-CM | POA: Diagnosis not present

## 2016-08-17 DIAGNOSIS — N186 End stage renal disease: Secondary | ICD-10-CM | POA: Diagnosis not present

## 2016-08-17 DIAGNOSIS — D631 Anemia in chronic kidney disease: Secondary | ICD-10-CM | POA: Diagnosis not present

## 2016-08-19 DIAGNOSIS — D631 Anemia in chronic kidney disease: Secondary | ICD-10-CM | POA: Diagnosis not present

## 2016-08-19 DIAGNOSIS — N2581 Secondary hyperparathyroidism of renal origin: Secondary | ICD-10-CM | POA: Diagnosis not present

## 2016-08-19 DIAGNOSIS — N186 End stage renal disease: Secondary | ICD-10-CM | POA: Diagnosis not present

## 2016-08-21 DIAGNOSIS — N2581 Secondary hyperparathyroidism of renal origin: Secondary | ICD-10-CM | POA: Diagnosis not present

## 2016-08-21 DIAGNOSIS — D631 Anemia in chronic kidney disease: Secondary | ICD-10-CM | POA: Diagnosis not present

## 2016-08-21 DIAGNOSIS — N186 End stage renal disease: Secondary | ICD-10-CM | POA: Diagnosis not present

## 2016-08-24 DIAGNOSIS — N2581 Secondary hyperparathyroidism of renal origin: Secondary | ICD-10-CM | POA: Diagnosis not present

## 2016-08-24 DIAGNOSIS — D631 Anemia in chronic kidney disease: Secondary | ICD-10-CM | POA: Diagnosis not present

## 2016-08-24 DIAGNOSIS — N186 End stage renal disease: Secondary | ICD-10-CM | POA: Diagnosis not present

## 2016-08-26 DIAGNOSIS — D631 Anemia in chronic kidney disease: Secondary | ICD-10-CM | POA: Diagnosis not present

## 2016-08-26 DIAGNOSIS — N186 End stage renal disease: Secondary | ICD-10-CM | POA: Diagnosis not present

## 2016-08-26 DIAGNOSIS — N2581 Secondary hyperparathyroidism of renal origin: Secondary | ICD-10-CM | POA: Diagnosis not present

## 2016-08-28 DIAGNOSIS — D631 Anemia in chronic kidney disease: Secondary | ICD-10-CM | POA: Diagnosis not present

## 2016-08-28 DIAGNOSIS — N186 End stage renal disease: Secondary | ICD-10-CM | POA: Diagnosis not present

## 2016-08-28 DIAGNOSIS — N2581 Secondary hyperparathyroidism of renal origin: Secondary | ICD-10-CM | POA: Diagnosis not present

## 2016-08-31 DIAGNOSIS — D631 Anemia in chronic kidney disease: Secondary | ICD-10-CM | POA: Diagnosis not present

## 2016-08-31 DIAGNOSIS — N186 End stage renal disease: Secondary | ICD-10-CM | POA: Diagnosis not present

## 2016-08-31 DIAGNOSIS — N2581 Secondary hyperparathyroidism of renal origin: Secondary | ICD-10-CM | POA: Diagnosis not present

## 2016-09-02 DIAGNOSIS — D631 Anemia in chronic kidney disease: Secondary | ICD-10-CM | POA: Diagnosis not present

## 2016-09-02 DIAGNOSIS — N2581 Secondary hyperparathyroidism of renal origin: Secondary | ICD-10-CM | POA: Diagnosis not present

## 2016-09-02 DIAGNOSIS — N186 End stage renal disease: Secondary | ICD-10-CM | POA: Diagnosis not present

## 2016-09-02 NOTE — Telephone Encounter (Signed)
Error

## 2016-09-04 DIAGNOSIS — D631 Anemia in chronic kidney disease: Secondary | ICD-10-CM | POA: Diagnosis not present

## 2016-09-04 DIAGNOSIS — Z992 Dependence on renal dialysis: Secondary | ICD-10-CM | POA: Diagnosis not present

## 2016-09-04 DIAGNOSIS — I120 Hypertensive chronic kidney disease with stage 5 chronic kidney disease or end stage renal disease: Secondary | ICD-10-CM | POA: Diagnosis not present

## 2016-09-04 DIAGNOSIS — N186 End stage renal disease: Secondary | ICD-10-CM | POA: Diagnosis not present

## 2016-09-04 DIAGNOSIS — N2581 Secondary hyperparathyroidism of renal origin: Secondary | ICD-10-CM | POA: Diagnosis not present

## 2016-09-07 DIAGNOSIS — D631 Anemia in chronic kidney disease: Secondary | ICD-10-CM | POA: Diagnosis not present

## 2016-09-07 DIAGNOSIS — D509 Iron deficiency anemia, unspecified: Secondary | ICD-10-CM | POA: Diagnosis not present

## 2016-09-07 DIAGNOSIS — N2581 Secondary hyperparathyroidism of renal origin: Secondary | ICD-10-CM | POA: Diagnosis not present

## 2016-09-07 DIAGNOSIS — N186 End stage renal disease: Secondary | ICD-10-CM | POA: Diagnosis not present

## 2016-09-07 DIAGNOSIS — E119 Type 2 diabetes mellitus without complications: Secondary | ICD-10-CM | POA: Diagnosis not present

## 2016-09-09 DIAGNOSIS — E119 Type 2 diabetes mellitus without complications: Secondary | ICD-10-CM | POA: Diagnosis not present

## 2016-09-09 DIAGNOSIS — D631 Anemia in chronic kidney disease: Secondary | ICD-10-CM | POA: Diagnosis not present

## 2016-09-09 DIAGNOSIS — N2581 Secondary hyperparathyroidism of renal origin: Secondary | ICD-10-CM | POA: Diagnosis not present

## 2016-09-09 DIAGNOSIS — N186 End stage renal disease: Secondary | ICD-10-CM | POA: Diagnosis not present

## 2016-09-09 DIAGNOSIS — D509 Iron deficiency anemia, unspecified: Secondary | ICD-10-CM | POA: Diagnosis not present

## 2016-09-10 ENCOUNTER — Ambulatory Visit (INDEPENDENT_AMBULATORY_CARE_PROVIDER_SITE_OTHER): Payer: Medicare Other | Admitting: Family

## 2016-09-10 ENCOUNTER — Encounter (INDEPENDENT_AMBULATORY_CARE_PROVIDER_SITE_OTHER): Payer: Self-pay | Admitting: Family

## 2016-09-10 DIAGNOSIS — E1121 Type 2 diabetes mellitus with diabetic nephropathy: Secondary | ICD-10-CM | POA: Diagnosis not present

## 2016-09-10 DIAGNOSIS — B351 Tinea unguium: Secondary | ICD-10-CM | POA: Diagnosis not present

## 2016-09-10 NOTE — Progress Notes (Signed)
Office Visit Note   Patient: [name]           Date of Birth: January 14, 1920           MRN: [id number] Visit Date: 09/10/2016              Requested by: [name], MD [place], [place] [id number] PCP: [name], MD  Chief Complaint  Patient presents with  . Right Foot - Follow-up  . Left Foot - Follow-up      HPI: The patient is a 83 year old woman who presents today for evaluation of bilateral feet. The patient states in dialysis the staff been concerned about discoloration to the second and third toenails on her right foot. States there was concern for infection. Does have some yellow discoloration to her great toenail as well. Does not have any open ulcerations no drainage or redness and fever no chills.   She has thickened and discolored onychomycotic nails times 8.   Assessment & Plan: Visit Diagnoses:  1. Onychomycosis   2. Controlled type 2 diabetes mellitus with diabetic nephropathy, without long-term current use of insulin (HCC)     Plan: Nails trimmed bilaterally today follow-up in office in 3 months.  Follow-Up Instructions: Return if symptoms worsen or fail to improve.   Ortho Exam  Patient is alert, oriented, no adenopathy, well-dressed, normal affect, normal respiratory effort. Thickened and discolored onychomycotic nails 8. Does have subungual hematoma to the second and third toenails on the right. There is no drainage ulceration no erythema no sign of infection. She is unable to safely trim her own nails due to insensate neuropathy. These were trimmed today without incident.  Imaging: No results found.  Labs: Lab Results  Component Value Date   HGBA1C 5.5 08/13/2016   HGBA1C 5.9 02/13/2016   HGBA1C 5.8 05/21/2015   ESRSEDRATE 135 (H) 07/19/2010   ESRSEDRATE >140 (H) 05/18/2010   CRP 23.8 (H) 05/18/2010   REPTSTATUS 09/20/2015 FINAL 09/18/2015   GRAMSTAIN  07/18/2010    FEW WBC PRESENT,BOTH PMN AND  MONONUCLEAR FEW GRAM POSITIVE COCCI IN PAIRS   GRAMSTAIN  07/18/2010    FEW WBC PRESENT,BOTH PMN AND MONONUCLEAR FEW GRAM POSITIVE COCCI IN PAIRS Performed at [facility]  07/18/2010    FEW WBC PRESENT,BOTH PMN AND MONONUCLEAR FEW GRAM POSITIVE COCCI IN PAIRS Performed at [facility] >=100,000 COLONIES/mL ESCHERICHIA COLI (A) 09/18/2015   LABORGA ESCHERICHIA COLI (A) 09/18/2015    Orders:  No orders of the defined types were placed in this encounter.  No orders of the defined types were placed in this encounter.    Procedures: No procedures performed  Clinical Data: No additional findings.  ROS:  Review of Systems  Constitutional: Negative for chills and fever.  Skin: Negative for color change and wound.    Objective: Vital Signs: There were no vitals taken for this visit.  Specialty Comments:  No specialty comments available.  PMFS History: Patient Active Problem List   Diagnosis Date Noted  . Cerebrovascular accident (CVA) due to occlusion of right middle cerebral artery ([place]) 10/24/2015  . Malnutrition of mild degree ([place]) 10/24/2015  . Altered mental status 09/18/2015  . Preventative health care 05/03/2014  . Peripheral vascular disease ([place]) 12/21/2013  . Toe amputation status ([place]) 08/29/2013  . History of recent fall 04/18/2013  . HTN (hypertension), malignant 03/22/2013  . S/P aortic valve replacement with bioprosthetic valve 11/15/2012  .  S/P CABG x 3 11/15/2012  . Anemia of chronic kidney failure 12/22/2010  . Thrombocytopenia ([place]) 12/13/2010  . End stage renal disease on dialysis ([place]) 12/13/2010  . S/P aortic valve replacement 08/11/2010  . Arterial insufficiency-lower 07/25/2010  . NEURODERMATITIS 04/08/2009  . Carotid arterial disease ([place]) 01/01/2009  . Coronary atherosclerosis 02/11/2007  . AORTIC STENOSIS 10/04/2006  . ANXIETY 05/30/2006  . Pulmonary hypertension ([place]) 05/30/2006  . Hypothyroidism  05/27/2006  . Type 2 diabetes mellitus with renal manifestations, controlled ([place]) 05/27/2006  . Hyperlipidemia 05/27/2006  . Essential hypertension 05/27/2006  . GERD 05/27/2006  . OSTEOARTHRITIS 05/27/2006  . OSTEOPENIA 05/27/2006  . URINARY INCONTINENCE 05/27/2006   Past Medical History:  Diagnosis Date  . Anemia    NOS  . Anxiety   . Aortic stenosis   . Cancer [facility])    Mole on top of head to be removed in July 2013  . CHF (congestive heart failure) ([place])   . Constipation   . Coronary artery disease   . Diabetes mellitus    type II;was on Glipizide but has been off x 38mon  . Dialysis patient ([place])   . Diarrhea   . GERD (gastroesophageal reflux disease)   . History of blood transfusion   . Hyperlipidemia    takes Simvastatin nightly  . Hyperlipidemia   . Hypertension    takes Metoprolol daily  . Hypothyroidism    takes Synthroid daily  . Migraine    hx of  . Oligouria   . Osteoarthritis   . Osteopenia   . Peripheral vascular disease ([place])   . Personal history of colonic polyps   . Pulmonary hypertension ([place])   . Renal insufficiency   . Urinary incontinence   . UTI (urinary tract infection)     Family History  Problem Relation Age of Onset  . Family history unknown: Yes    Past Surgical History:  Procedure Laterality Date  . ABDOMINAL HYSTERECTOMY    . adenosine myoview  2007   benign, EF 69%  . AMPUTATION  06/18/2011   Procedure: AMPUTATION DIGIT;  Surgeon: Newt Minion, MD;  Location: [facility];  Service: Orthopedics;  Laterality: Left;  Left 2nd Toe Amputation at MTP Joint  . Amputation-left great toe  7/12   Dr Marlou Sa  . AORTIC VALVE REPLACEMENT  2009  . APPENDECTOMY    . AV FISTULA PLACEMENT  12/22/2010   Procedure: ARTERIOVENOUS (AV) FISTULA CREATION;  Surgeon: Hinda Lenis, MD;  Location: [facility];  Service: Vascular;  Laterality: Left;  Creation of Left Brachial-Cephalic Fistula  . CARDIAC CATHETERIZATION  2000   cad  . CAROTID ENDARTERECTOMY   2011   Right  . CATARACT EXTRACTION    . CORONARY ARTERY BYPASS GRAFT     od  . CORONARY ARTERY BYPASS GRAFT  2009  . EYE SURGERY     BIlateral  . INSERTION OF DIALYSIS CATHETER  12/18/2010   Procedure: INSERTION OF DIALYSIS CATHETER;  Surgeon: Mal Misty, MD;  Location: [facility];  Service: Vascular;  Laterality: Right;  . REPLACEMENT TOTAL KNEE BILATERAL  05/1998  . TONSILLECTOMY    . Traumatic Amputation of Right DIP joint of Index Finger     Social History   Occupational History  . 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 Smoking status: Former Smoker    Years: 15.00    Types: Cigarettes    Quit date: 01/06/1943  . Smokeless tobacco: Never  Used  . Alcohol use Yes     Comment: Wine occasionally  . Drug use: No  . Sexual activity: No

## 2016-09-11 DIAGNOSIS — N186 End stage renal disease: Secondary | ICD-10-CM | POA: Diagnosis not present

## 2016-09-11 DIAGNOSIS — N2581 Secondary hyperparathyroidism of renal origin: Secondary | ICD-10-CM | POA: Diagnosis not present

## 2016-09-11 DIAGNOSIS — E119 Type 2 diabetes mellitus without complications: Secondary | ICD-10-CM | POA: Diagnosis not present

## 2016-09-11 DIAGNOSIS — D509 Iron deficiency anemia, unspecified: Secondary | ICD-10-CM | POA: Diagnosis not present

## 2016-09-11 DIAGNOSIS — D631 Anemia in chronic kidney disease: Secondary | ICD-10-CM | POA: Diagnosis not present

## 2016-09-14 DIAGNOSIS — N2581 Secondary hyperparathyroidism of renal origin: Secondary | ICD-10-CM | POA: Diagnosis not present

## 2016-09-14 DIAGNOSIS — D509 Iron deficiency anemia, unspecified: Secondary | ICD-10-CM | POA: Diagnosis not present

## 2016-09-14 DIAGNOSIS — N186 End stage renal disease: Secondary | ICD-10-CM | POA: Diagnosis not present

## 2016-09-14 DIAGNOSIS — D631 Anemia in chronic kidney disease: Secondary | ICD-10-CM | POA: Diagnosis not present

## 2016-09-14 DIAGNOSIS — E119 Type 2 diabetes mellitus without complications: Secondary | ICD-10-CM | POA: Diagnosis not present

## 2016-09-16 ENCOUNTER — Emergency Department
Admission: EM | Admit: 2016-09-16 | Discharge: 2016-09-16 | Disposition: A | Discharge status: Home / Self Care | Payer: Medicare Other | Attending: Emergency Medicine | Admitting: Emergency Medicine

## 2016-09-16 ENCOUNTER — Encounter: Payer: Self-pay | Admitting: *Deleted

## 2016-09-16 DIAGNOSIS — E119 Type 2 diabetes mellitus without complications: Secondary | ICD-10-CM | POA: Diagnosis not present

## 2016-09-16 DIAGNOSIS — Z85828 Personal history of other malignant neoplasm of skin: Secondary | ICD-10-CM | POA: Insufficient documentation

## 2016-09-16 DIAGNOSIS — I25810 Atherosclerosis of coronary artery bypass graft(s) without angina pectoris: Secondary | ICD-10-CM | POA: Insufficient documentation

## 2016-09-16 DIAGNOSIS — I509 Heart failure, unspecified: Secondary | ICD-10-CM | POA: Diagnosis not present

## 2016-09-16 DIAGNOSIS — K9401 Colostomy hemorrhage: Secondary | ICD-10-CM | POA: Diagnosis not present

## 2016-09-16 DIAGNOSIS — E039 Hypothyroidism, unspecified: Secondary | ICD-10-CM | POA: Insufficient documentation

## 2016-09-16 DIAGNOSIS — Z89412 Acquired absence of left great toe: Secondary | ICD-10-CM | POA: Insufficient documentation

## 2016-09-16 DIAGNOSIS — N2581 Secondary hyperparathyroidism of renal origin: Secondary | ICD-10-CM | POA: Diagnosis not present

## 2016-09-16 DIAGNOSIS — Z87891 Personal history of nicotine dependence: Secondary | ICD-10-CM | POA: Insufficient documentation

## 2016-09-16 DIAGNOSIS — Y828 Other medical devices associated with adverse incidents: Secondary | ICD-10-CM | POA: Diagnosis not present

## 2016-09-16 DIAGNOSIS — Z7901 Long term (current) use of anticoagulants: Secondary | ICD-10-CM | POA: Diagnosis not present

## 2016-09-16 DIAGNOSIS — L7682 Other postprocedural complications of skin and subcutaneous tissue: Secondary | ICD-10-CM | POA: Diagnosis not present

## 2016-09-16 DIAGNOSIS — Z96653 Presence of artificial knee joint, bilateral: Secondary | ICD-10-CM | POA: Insufficient documentation

## 2016-09-16 DIAGNOSIS — D631 Anemia in chronic kidney disease: Secondary | ICD-10-CM | POA: Diagnosis not present

## 2016-09-16 DIAGNOSIS — N186 End stage renal disease: Secondary | ICD-10-CM | POA: Insufficient documentation

## 2016-09-16 DIAGNOSIS — I132 Hypertensive heart and chronic kidney disease with heart failure and with stage 5 chronic kidney disease, or end stage renal disease: Secondary | ICD-10-CM | POA: Diagnosis not present

## 2016-09-16 DIAGNOSIS — D509 Iron deficiency anemia, unspecified: Secondary | ICD-10-CM | POA: Diagnosis not present

## 2016-09-16 DIAGNOSIS — Z7982 Long term (current) use of aspirin: Secondary | ICD-10-CM | POA: Insufficient documentation

## 2016-09-16 DIAGNOSIS — T82838A Hemorrhage of vascular prosthetic devices, implants and grafts, initial encounter: Secondary | ICD-10-CM | POA: Diagnosis not present

## 2016-09-16 DIAGNOSIS — Z951 Presence of aortocoronary bypass graft: Secondary | ICD-10-CM | POA: Diagnosis not present

## 2016-09-16 DIAGNOSIS — E1151 Type 2 diabetes mellitus with diabetic peripheral angiopathy without gangrene: Secondary | ICD-10-CM | POA: Insufficient documentation

## 2016-09-16 MED ORDER — LIDOCAINE HCL (PF) 1 % IJ SOLN
5.0000 mL | Freq: Once | INTRAMUSCULAR | Status: AC
  Administered 2016-09-16: 5 mL
  Filled 2016-09-16: qty 5

## 2016-09-16 MED ORDER — LIDOCAINE HCL (PF) 1 % IJ SOLN
INTRAMUSCULAR | Status: AC
  Filled 2016-09-16: qty 5

## 2016-09-16 NOTE — ED Notes (Signed)
md in with pt again to remove gauze.  No blleding from fistula.  Pt alert.  Skin warm and dry.

## 2016-09-16 NOTE — ED Notes (Signed)
Pt has bleeding from left upper arm fistula.  Pt completed dialysis today and was sent to er for eval of uncontrolled bleeding from fistula.  Pressure applied by medic and md.  Pt alert.  Speech clear.  Skin warm and dry

## 2016-09-16 NOTE — ED Notes (Signed)
Pt sleeping   Waiting on family for discharge. sierails up x 2   No bleeding from fistula.

## 2016-09-16 NOTE — ED Provider Notes (Signed)
[facility] Emergency Department Provider Note  ____________________________________________   First MD Initiated Contact with Patient 09/16/16 1723     (approximate)  I have reviewed the triage vital signs and the nursing notes.   HISTORY  Chief Complaint Coagulation Disorder    HPI [name] is a 83 y.o. female who comes to the emergency department via EMS with a bleeding left upper extremity fistula after completing a full run of dialysis today.EMS applied pressure to stop the bleeding. She is had multiple episodes of this in the past. She currently has moderate to severe pain in her left upper extremity worse when pressure is applied improved when not. This began after dialysis. She has no chest pain or shortness of breath.   Past Medical History:  Diagnosis Date  . Anemia    NOS  . Anxiety   . Aortic stenosis   . Cancer [facility])    Mole on top of head to be removed in July 2013  . CHF (congestive heart failure) ([place])   . Constipation   . Coronary artery disease   . Diabetes mellitus    type II;was on Glipizide but has been off x 34mon  . Dialysis patient ([place])   . Diarrhea   . GERD (gastroesophageal reflux disease)   . History of blood transfusion   . Hyperlipidemia    takes Simvastatin nightly  . Hyperlipidemia   . Hypertension    takes Metoprolol daily  . Hypothyroidism    takes Synthroid daily  . Migraine    hx of  . Oligouria   . Osteoarthritis   . Osteopenia   . Peripheral vascular disease ([place])   . Personal history of colonic polyps   . Pulmonary hypertension ([place])   . Renal insufficiency   . Urinary incontinence   . UTI (urinary tract infection)     Patient Active Problem List   Diagnosis Date Noted  . Cerebrovascular accident (CVA) due to occlusion of right middle cerebral artery ([place]) 10/24/2015  . Malnutrition of mild degree ([place]) 10/24/2015  . Altered mental status 09/18/2015  . Preventative health care  05/03/2014  . Peripheral vascular disease ([place]) 12/21/2013  . Toe amputation status ([place]) 08/29/2013  . History of recent fall 04/18/2013  . HTN (hypertension), malignant 03/22/2013  . S/P aortic valve replacement with bioprosthetic valve 11/15/2012  . S/P CABG x 3 11/15/2012  . Anemia of chronic kidney failure 12/22/2010  . Thrombocytopenia ([place]) 12/13/2010  . End stage renal disease on dialysis ([place]) 12/13/2010  . S/P aortic valve replacement 08/11/2010  . Arterial insufficiency-lower 07/25/2010  . NEURODERMATITIS 04/08/2009  . Carotid arterial disease ([place]) 01/01/2009  . Coronary atherosclerosis 02/11/2007  . AORTIC STENOSIS 10/04/2006  . ANXIETY 05/30/2006  . Pulmonary hypertension ([place]) 05/30/2006  . Hypothyroidism 05/27/2006  . Type 2 diabetes mellitus with renal manifestations, controlled ([place]) 05/27/2006  . Hyperlipidemia 05/27/2006  . Essential hypertension 05/27/2006  . GERD 05/27/2006  . OSTEOARTHRITIS 05/27/2006  . OSTEOPENIA 05/27/2006  . URINARY INCONTINENCE 05/27/2006    Past Surgical History:  Procedure Laterality Date  . ABDOMINAL HYSTERECTOMY    . adenosine myoview  2007   benign, EF 69%  . AMPUTATION  06/18/2011   Procedure: AMPUTATION DIGIT;  Surgeon: Newt Minion, MD;  Location: [place];  Service: Orthopedics;  Laterality: Left;  Left 2nd Toe Amputation at MTP Joint  . Amputation-left great toe  7/12   Dr Marlou Sa  . AORTIC VALVE REPLACEMENT  2009  .  APPENDECTOMY    . AV FISTULA PLACEMENT  12/22/2010   Procedure: ARTERIOVENOUS (AV) FISTULA CREATION;  Surgeon: Hinda Lenis, MD;  Location: [place];  Service: Vascular;  Laterality: Left;  Creation of Left Brachial-Cephalic Fistula  . CARDIAC CATHETERIZATION  2000   cad  . CAROTID ENDARTERECTOMY  2011   Right  . CATARACT EXTRACTION    . CORONARY ARTERY BYPASS GRAFT     od  . CORONARY ARTERY BYPASS GRAFT  2009  . EYE SURGERY     BIlateral  . INSERTION OF DIALYSIS CATHETER  12/18/2010   Procedure:  INSERTION OF DIALYSIS CATHETER;  Surgeon: Mal Misty, MD;  Location: [place];  Service: Vascular;  Laterality: Right;  . REPLACEMENT TOTAL KNEE BILATERAL  05/1998  . TONSILLECTOMY    . Traumatic Amputation of Right DIP joint of Index Finger      Prior to Admission medications   Medication Sig Start Date End Date Taking? Authorizing Provider  amLODipine (NORVASC) 5 MG tablet Take 1 tablet (5 mg total) by mouth daily. 11/12/15   [name], MD  aspirin EC 81 MG tablet Take 81 mg by mouth at bedtime.    [provider]  calcium carbonate (TUMS - DOSED IN MG ELEMENTAL CALCIUM) 500 MG chewable tablet Chew 1 tablet (200 mg of elemental calcium total) by mouth 3 (three) times daily with meals. 12/06/12   Geradine Girt, DO  cloNIDine (CATAPRES) 0.1 MG tablet Take 1 tablet (0.1 mg total) by mouth 2 (two) times daily. 02/13/16   [name], MD  clopidogrel (PLAVIX) 75 MG tablet Take 1 tablet (75 mg total) by mouth daily. 01/20/16   [name], MD  folic acid (FOLVITE) 0.5 MG tablet Take 0.5 tablets (0.5 mg total) by mouth daily. 12/06/12   Geradine Girt, DO  hydrALAZINE (APRESOLINE) 25 MG tablet TAKE ONE TABLET BY MOUTH EVERY 8 HOURS. 04/01/16   [name], MD  levothyroxine (SYNTHROID, LEVOTHROID) 75 MCG tablet TAKE ONE TABLET BY MOUTH ONCE DAILY 07/07/16   [name], MD  metoprolol (LOPRESSOR) 50 MG tablet Take 1 tablet (50 mg total) by mouth 2 (two) times daily. 11/25/15   Minna Merritts, MD  polyvinyl alcohol (LIQUIFILM TEARS) 1.4 % ophthalmic solution Place 1 drop into both eyes as needed for dry eyes. 09/22/15   Fritzi Mandes, MD  pravastatin (PRAVACHOL) 40 MG tablet TAKE ONE TABLET BY MOUTH IN THE EVENING 07/21/16   Minna Merritts, MD  vitamin C (VITAMIN C) 500 MG tablet Take 1 tablet (500 mg total) by mouth daily. 12/06/12   Geradine Girt, DO    Allergies Nitrofurantoin; Captopril; Enalapril maleate; Ramipril; Sulfa antibiotics; and  Verapamil  Family History  Problem Relation Age of Onset  . Family history unknown: Yes    Social History Social History  Substance Use Topics  . Smoking status: Former Smoker    Years: 15.00    Types: Cigarettes    Quit date: 01/06/1943  . Smokeless tobacco: Never Used  . Alcohol use Yes     Comment: Wine occasionally    Review of Systems Constitutional: No fever/chills Eyes: No visual changes. ENT: No sore throat. Cardiovascular: Denies chest pain. Respiratory: Denies shortness of breath. Gastrointestinal: No abdominal pain.  No nausea, no vomiting.  No diarrhea.  No constipation. Genitourinary: Negative for dysuria. Musculoskeletal: Negative for back pain. Skin: Positive for wound Neurological: Negative for headaches, focal weakness or numbness.   ____________________________________________  PHYSICAL EXAM:  VITAL SIGNS: ED Triage Vitals  Enc Vitals Group     BP 09/16/16 1708 (!) 217/80     Pulse Rate 09/16/16 1708 92     Resp 09/16/16 1708 18     Temp 09/16/16 1708 98.2 F (36.8 C)     Temp Source 09/16/16 1708 Oral     SpO2 09/16/16 1708 90 %     Weight 09/16/16 1709 105 lb (47.6 kg)     Height 09/16/16 1709 5\' 3"  (1.6 m)     Head Circumference --      Peak Flow --      Pain Score --      Pain Loc --      Pain Edu? --      Excl. in [place]? --     Constitutional: Alert and oriented 4 appears somewhat uncomfortable nontoxic no diaphoresis speaks in full clear sentences Eyes: PERRL EOMI. Head: Atraumatic. Nose: No congestion/rhinnorhea. Mouth/Throat: No trismus Neck: No stridor.   Cardiovascular: Normal rate, regular rhythm. Grossly normal heart sounds.  Good peripheral circulation. Respiratory: Normal respiratory effort.  No retractions. Lungs CTAB and moving good air Gastrointestinal: Soft nontender Musculoskeletal: Fistula to left upper extremity with good thrill. After achieving local control and noted a single area of punctate pulsatile  bleeding Neurologic:  Normal speech and language. No gross focal neurologic deficits are appreciated. Skin:  Skin is warm, dry and intact. No rash noted. Psychiatric: Mood and affect are normal. Speech and behavior are normal.    _______________________________________   LABS (all labs ordered are listed, but only abnormal results are displayed)  Labs Reviewed - No data to display   __________________________________________  EKG   ____________________________________________  RADIOLOGY   ____________________________________________   PROCEDURES  Procedure(s) performed: no  Procedures  Critical Care performed: no  Observation: no ____________________________________________   INITIAL IMPRESSION / ASSESSMENT AND PLAN / ED COURSE  Pertinent labs & imaging results that were available during my care of the patient were reviewed by me and considered in my medical decision making (see chart for details).  The patient arrived with an active bleed to her AV fistula. I achieved local control with pressure above and below and it was able to visualize a single punctate area of bleeding. I then provided 10 minutes of continuous constant pressure with no gauze and only a finger while I pulled up lidocaine in anticipation of suturing the bleeding site closed. Prior to suturing the wound stopped bleeding. I then applied Surgicel and wrapped the wound for one hour. Following this I applied Dermabond and observed another 30 minutes. The wound has not yet bled. At this point she is stable for discharge.      ____________________________________________   FINAL CLINICAL IMPRESSION(S) / ED DIAGNOSES  Final diagnoses:  Bleeding at insertion site      NEW MEDICATIONS STARTED DURING THIS VISIT:  New Prescriptions   No medications on file     Note:  This document was prepared using Dragon voice recognition software and may include unintentional dictation errors.      Darel Hong, MD 09/16/16 [phone]

## 2016-09-16 NOTE — ED Triage Notes (Signed)
Pt brought in via ems from [facility] with bleeding from fistula on left upper arm.   Pressure being applied  By medic and md.  Pt alert.  Skin warm and dry.

## 2016-09-16 NOTE — Discharge Instructions (Signed)
Please follow-up with your primary care physician as needed and return to the emergency department for any concerns. ° °It was a pleasure to take care of you today, and thank you for coming to our emergency department.  If you have any questions or concerns before leaving please ask the nurse to grab me and I'm more than happy to go through your aftercare instructions again. ° °If you were prescribed any opioid pain medication today such as Norco, Vicodin, Percocet, morphine, hydrocodone, or oxycodone please make sure you do not drive when you are taking this medication as it can alter your ability to drive safely. ° °If you have any concerns once you are home that you are not improving or are in fact getting worse before you can make it to your follow-up appointment, please do not hesitate to call 911 and come back for further evaluation. ° °Purvi Ruehl, MD ° ° °

## 2016-09-16 NOTE — ED Notes (Signed)
Bleeding stopped.  md at bedside.  Pressure qauze applied to left upper arm by dr Mable Paris.  Pt alert.  Skin warm and dry.

## 2016-09-18 DIAGNOSIS — N2581 Secondary hyperparathyroidism of renal origin: Secondary | ICD-10-CM | POA: Diagnosis not present

## 2016-09-18 DIAGNOSIS — E119 Type 2 diabetes mellitus without complications: Secondary | ICD-10-CM | POA: Diagnosis not present

## 2016-09-18 DIAGNOSIS — D509 Iron deficiency anemia, unspecified: Secondary | ICD-10-CM | POA: Diagnosis not present

## 2016-09-18 DIAGNOSIS — N186 End stage renal disease: Secondary | ICD-10-CM | POA: Diagnosis not present

## 2016-09-18 DIAGNOSIS — D631 Anemia in chronic kidney disease: Secondary | ICD-10-CM | POA: Diagnosis not present

## 2016-09-21 DIAGNOSIS — E119 Type 2 diabetes mellitus without complications: Secondary | ICD-10-CM | POA: Diagnosis not present

## 2016-09-21 DIAGNOSIS — D509 Iron deficiency anemia, unspecified: Secondary | ICD-10-CM | POA: Diagnosis not present

## 2016-09-21 DIAGNOSIS — N186 End stage renal disease: Secondary | ICD-10-CM | POA: Diagnosis not present

## 2016-09-21 DIAGNOSIS — N2581 Secondary hyperparathyroidism of renal origin: Secondary | ICD-10-CM | POA: Diagnosis not present

## 2016-09-21 DIAGNOSIS — D631 Anemia in chronic kidney disease: Secondary | ICD-10-CM | POA: Diagnosis not present

## 2016-09-23 DIAGNOSIS — E119 Type 2 diabetes mellitus without complications: Secondary | ICD-10-CM | POA: Diagnosis not present

## 2016-09-23 DIAGNOSIS — N186 End stage renal disease: Secondary | ICD-10-CM | POA: Diagnosis not present

## 2016-09-23 DIAGNOSIS — D631 Anemia in chronic kidney disease: Secondary | ICD-10-CM | POA: Diagnosis not present

## 2016-09-23 DIAGNOSIS — D509 Iron deficiency anemia, unspecified: Secondary | ICD-10-CM | POA: Diagnosis not present

## 2016-09-23 DIAGNOSIS — N2581 Secondary hyperparathyroidism of renal origin: Secondary | ICD-10-CM | POA: Diagnosis not present

## 2016-09-24 ENCOUNTER — Ambulatory Visit (INDEPENDENT_AMBULATORY_CARE_PROVIDER_SITE_OTHER): Payer: Medicare Other

## 2016-09-24 DIAGNOSIS — Z23 Encounter for immunization: Secondary | ICD-10-CM | POA: Diagnosis not present

## 2016-09-25 DIAGNOSIS — E119 Type 2 diabetes mellitus without complications: Secondary | ICD-10-CM | POA: Diagnosis not present

## 2016-09-25 DIAGNOSIS — N186 End stage renal disease: Secondary | ICD-10-CM | POA: Diagnosis not present

## 2016-09-25 DIAGNOSIS — N2581 Secondary hyperparathyroidism of renal origin: Secondary | ICD-10-CM | POA: Diagnosis not present

## 2016-09-25 DIAGNOSIS — D631 Anemia in chronic kidney disease: Secondary | ICD-10-CM | POA: Diagnosis not present

## 2016-09-25 DIAGNOSIS — D509 Iron deficiency anemia, unspecified: Secondary | ICD-10-CM | POA: Diagnosis not present

## 2016-09-28 DIAGNOSIS — D509 Iron deficiency anemia, unspecified: Secondary | ICD-10-CM | POA: Diagnosis not present

## 2016-09-28 DIAGNOSIS — E119 Type 2 diabetes mellitus without complications: Secondary | ICD-10-CM | POA: Diagnosis not present

## 2016-09-28 DIAGNOSIS — N186 End stage renal disease: Secondary | ICD-10-CM | POA: Diagnosis not present

## 2016-09-28 DIAGNOSIS — N2581 Secondary hyperparathyroidism of renal origin: Secondary | ICD-10-CM | POA: Diagnosis not present

## 2016-09-28 DIAGNOSIS — D631 Anemia in chronic kidney disease: Secondary | ICD-10-CM | POA: Diagnosis not present

## 2016-09-29 DIAGNOSIS — T82858A Stenosis of vascular prosthetic devices, implants and grafts, initial encounter: Secondary | ICD-10-CM | POA: Diagnosis not present

## 2016-09-29 DIAGNOSIS — I871 Compression of vein: Secondary | ICD-10-CM | POA: Diagnosis not present

## 2016-09-29 DIAGNOSIS — Z992 Dependence on renal dialysis: Secondary | ICD-10-CM | POA: Diagnosis not present

## 2016-09-29 DIAGNOSIS — N186 End stage renal disease: Secondary | ICD-10-CM | POA: Diagnosis not present

## 2016-09-30 ENCOUNTER — Telehealth: Payer: Self-pay

## 2016-09-30 DIAGNOSIS — N186 End stage renal disease: Secondary | ICD-10-CM | POA: Diagnosis not present

## 2016-09-30 DIAGNOSIS — N2581 Secondary hyperparathyroidism of renal origin: Secondary | ICD-10-CM | POA: Diagnosis not present

## 2016-09-30 DIAGNOSIS — E119 Type 2 diabetes mellitus without complications: Secondary | ICD-10-CM | POA: Diagnosis not present

## 2016-09-30 DIAGNOSIS — D509 Iron deficiency anemia, unspecified: Secondary | ICD-10-CM | POA: Diagnosis not present

## 2016-09-30 DIAGNOSIS — D631 Anemia in chronic kidney disease: Secondary | ICD-10-CM | POA: Diagnosis not present

## 2016-09-30 NOTE — Telephone Encounter (Signed)
[name] at [facility] needs confirmation faxed to [phone] that pt had flu shot this year. Done as requested.

## 2016-10-02 DIAGNOSIS — E119 Type 2 diabetes mellitus without complications: Secondary | ICD-10-CM | POA: Diagnosis not present

## 2016-10-02 DIAGNOSIS — D631 Anemia in chronic kidney disease: Secondary | ICD-10-CM | POA: Diagnosis not present

## 2016-10-02 DIAGNOSIS — N186 End stage renal disease: Secondary | ICD-10-CM | POA: Diagnosis not present

## 2016-10-02 DIAGNOSIS — N2581 Secondary hyperparathyroidism of renal origin: Secondary | ICD-10-CM | POA: Diagnosis not present

## 2016-10-02 DIAGNOSIS — D509 Iron deficiency anemia, unspecified: Secondary | ICD-10-CM | POA: Diagnosis not present

## 2016-10-04 DIAGNOSIS — N186 End stage renal disease: Secondary | ICD-10-CM | POA: Diagnosis not present

## 2016-10-04 DIAGNOSIS — Z992 Dependence on renal dialysis: Secondary | ICD-10-CM | POA: Diagnosis not present

## 2016-10-04 DIAGNOSIS — I120 Hypertensive chronic kidney disease with stage 5 chronic kidney disease or end stage renal disease: Secondary | ICD-10-CM | POA: Diagnosis not present

## 2016-10-05 DIAGNOSIS — D631 Anemia in chronic kidney disease: Secondary | ICD-10-CM | POA: Diagnosis not present

## 2016-10-05 DIAGNOSIS — D509 Iron deficiency anemia, unspecified: Secondary | ICD-10-CM | POA: Diagnosis not present

## 2016-10-05 DIAGNOSIS — N186 End stage renal disease: Secondary | ICD-10-CM | POA: Diagnosis not present

## 2016-10-05 DIAGNOSIS — N2581 Secondary hyperparathyroidism of renal origin: Secondary | ICD-10-CM | POA: Diagnosis not present

## 2016-10-07 DIAGNOSIS — D509 Iron deficiency anemia, unspecified: Secondary | ICD-10-CM | POA: Diagnosis not present

## 2016-10-07 DIAGNOSIS — N186 End stage renal disease: Secondary | ICD-10-CM | POA: Diagnosis not present

## 2016-10-07 DIAGNOSIS — N2581 Secondary hyperparathyroidism of renal origin: Secondary | ICD-10-CM | POA: Diagnosis not present

## 2016-10-07 DIAGNOSIS — D631 Anemia in chronic kidney disease: Secondary | ICD-10-CM | POA: Diagnosis not present

## 2016-10-09 DIAGNOSIS — D631 Anemia in chronic kidney disease: Secondary | ICD-10-CM | POA: Diagnosis not present

## 2016-10-09 DIAGNOSIS — N186 End stage renal disease: Secondary | ICD-10-CM | POA: Diagnosis not present

## 2016-10-09 DIAGNOSIS — D509 Iron deficiency anemia, unspecified: Secondary | ICD-10-CM | POA: Diagnosis not present

## 2016-10-09 DIAGNOSIS — N2581 Secondary hyperparathyroidism of renal origin: Secondary | ICD-10-CM | POA: Diagnosis not present

## 2016-10-12 DIAGNOSIS — N2581 Secondary hyperparathyroidism of renal origin: Secondary | ICD-10-CM | POA: Diagnosis not present

## 2016-10-12 DIAGNOSIS — N186 End stage renal disease: Secondary | ICD-10-CM | POA: Diagnosis not present

## 2016-10-12 DIAGNOSIS — D631 Anemia in chronic kidney disease: Secondary | ICD-10-CM | POA: Diagnosis not present

## 2016-10-12 DIAGNOSIS — D509 Iron deficiency anemia, unspecified: Secondary | ICD-10-CM | POA: Diagnosis not present

## 2016-10-14 DIAGNOSIS — N186 End stage renal disease: Secondary | ICD-10-CM | POA: Diagnosis not present

## 2016-10-14 DIAGNOSIS — D509 Iron deficiency anemia, unspecified: Secondary | ICD-10-CM | POA: Diagnosis not present

## 2016-10-14 DIAGNOSIS — D631 Anemia in chronic kidney disease: Secondary | ICD-10-CM | POA: Diagnosis not present

## 2016-10-14 DIAGNOSIS — N2581 Secondary hyperparathyroidism of renal origin: Secondary | ICD-10-CM | POA: Diagnosis not present

## 2016-10-16 DIAGNOSIS — D509 Iron deficiency anemia, unspecified: Secondary | ICD-10-CM | POA: Diagnosis not present

## 2016-10-16 DIAGNOSIS — D631 Anemia in chronic kidney disease: Secondary | ICD-10-CM | POA: Diagnosis not present

## 2016-10-16 DIAGNOSIS — N186 End stage renal disease: Secondary | ICD-10-CM | POA: Diagnosis not present

## 2016-10-16 DIAGNOSIS — N2581 Secondary hyperparathyroidism of renal origin: Secondary | ICD-10-CM | POA: Diagnosis not present

## 2016-10-19 DIAGNOSIS — N2581 Secondary hyperparathyroidism of renal origin: Secondary | ICD-10-CM | POA: Diagnosis not present

## 2016-10-19 DIAGNOSIS — D631 Anemia in chronic kidney disease: Secondary | ICD-10-CM | POA: Diagnosis not present

## 2016-10-19 DIAGNOSIS — D509 Iron deficiency anemia, unspecified: Secondary | ICD-10-CM | POA: Diagnosis not present

## 2016-10-19 DIAGNOSIS — N186 End stage renal disease: Secondary | ICD-10-CM | POA: Diagnosis not present

## 2016-10-21 DIAGNOSIS — N2581 Secondary hyperparathyroidism of renal origin: Secondary | ICD-10-CM | POA: Diagnosis not present

## 2016-10-21 DIAGNOSIS — D509 Iron deficiency anemia, unspecified: Secondary | ICD-10-CM | POA: Diagnosis not present

## 2016-10-21 DIAGNOSIS — D631 Anemia in chronic kidney disease: Secondary | ICD-10-CM | POA: Diagnosis not present

## 2016-10-21 DIAGNOSIS — N186 End stage renal disease: Secondary | ICD-10-CM | POA: Diagnosis not present

## 2016-10-23 DIAGNOSIS — N2581 Secondary hyperparathyroidism of renal origin: Secondary | ICD-10-CM | POA: Diagnosis not present

## 2016-10-23 DIAGNOSIS — N186 End stage renal disease: Secondary | ICD-10-CM | POA: Diagnosis not present

## 2016-10-23 DIAGNOSIS — D509 Iron deficiency anemia, unspecified: Secondary | ICD-10-CM | POA: Diagnosis not present

## 2016-10-23 DIAGNOSIS — D631 Anemia in chronic kidney disease: Secondary | ICD-10-CM | POA: Diagnosis not present

## 2016-10-26 DIAGNOSIS — D631 Anemia in chronic kidney disease: Secondary | ICD-10-CM | POA: Diagnosis not present

## 2016-10-26 DIAGNOSIS — D509 Iron deficiency anemia, unspecified: Secondary | ICD-10-CM | POA: Diagnosis not present

## 2016-10-26 DIAGNOSIS — N2581 Secondary hyperparathyroidism of renal origin: Secondary | ICD-10-CM | POA: Diagnosis not present

## 2016-10-26 DIAGNOSIS — N186 End stage renal disease: Secondary | ICD-10-CM | POA: Diagnosis not present

## 2016-10-28 DIAGNOSIS — D631 Anemia in chronic kidney disease: Secondary | ICD-10-CM | POA: Diagnosis not present

## 2016-10-28 DIAGNOSIS — D509 Iron deficiency anemia, unspecified: Secondary | ICD-10-CM | POA: Diagnosis not present

## 2016-10-28 DIAGNOSIS — N2581 Secondary hyperparathyroidism of renal origin: Secondary | ICD-10-CM | POA: Diagnosis not present

## 2016-10-28 DIAGNOSIS — N186 End stage renal disease: Secondary | ICD-10-CM | POA: Diagnosis not present

## 2016-10-30 DIAGNOSIS — D509 Iron deficiency anemia, unspecified: Secondary | ICD-10-CM | POA: Diagnosis not present

## 2016-10-30 DIAGNOSIS — D631 Anemia in chronic kidney disease: Secondary | ICD-10-CM | POA: Diagnosis not present

## 2016-10-30 DIAGNOSIS — N2581 Secondary hyperparathyroidism of renal origin: Secondary | ICD-10-CM | POA: Diagnosis not present

## 2016-10-30 DIAGNOSIS — N186 End stage renal disease: Secondary | ICD-10-CM | POA: Diagnosis not present

## 2016-11-02 DIAGNOSIS — N186 End stage renal disease: Secondary | ICD-10-CM | POA: Diagnosis not present

## 2016-11-02 DIAGNOSIS — D631 Anemia in chronic kidney disease: Secondary | ICD-10-CM | POA: Diagnosis not present

## 2016-11-02 DIAGNOSIS — N2581 Secondary hyperparathyroidism of renal origin: Secondary | ICD-10-CM | POA: Diagnosis not present

## 2016-11-02 DIAGNOSIS — D509 Iron deficiency anemia, unspecified: Secondary | ICD-10-CM | POA: Diagnosis not present

## 2016-11-04 DIAGNOSIS — N186 End stage renal disease: Secondary | ICD-10-CM | POA: Diagnosis not present

## 2016-11-04 DIAGNOSIS — I120 Hypertensive chronic kidney disease with stage 5 chronic kidney disease or end stage renal disease: Secondary | ICD-10-CM | POA: Diagnosis not present

## 2016-11-04 DIAGNOSIS — D631 Anemia in chronic kidney disease: Secondary | ICD-10-CM | POA: Diagnosis not present

## 2016-11-04 DIAGNOSIS — N2581 Secondary hyperparathyroidism of renal origin: Secondary | ICD-10-CM | POA: Diagnosis not present

## 2016-11-04 DIAGNOSIS — D509 Iron deficiency anemia, unspecified: Secondary | ICD-10-CM | POA: Diagnosis not present

## 2016-11-04 DIAGNOSIS — Z992 Dependence on renal dialysis: Secondary | ICD-10-CM | POA: Diagnosis not present

## 2016-11-06 DIAGNOSIS — N186 End stage renal disease: Secondary | ICD-10-CM | POA: Diagnosis not present

## 2016-11-06 DIAGNOSIS — D631 Anemia in chronic kidney disease: Secondary | ICD-10-CM | POA: Diagnosis not present

## 2016-11-06 DIAGNOSIS — D509 Iron deficiency anemia, unspecified: Secondary | ICD-10-CM | POA: Diagnosis not present

## 2016-11-06 DIAGNOSIS — N2581 Secondary hyperparathyroidism of renal origin: Secondary | ICD-10-CM | POA: Diagnosis not present

## 2016-11-09 DIAGNOSIS — D509 Iron deficiency anemia, unspecified: Secondary | ICD-10-CM | POA: Diagnosis not present

## 2016-11-09 DIAGNOSIS — N2581 Secondary hyperparathyroidism of renal origin: Secondary | ICD-10-CM | POA: Diagnosis not present

## 2016-11-09 DIAGNOSIS — D631 Anemia in chronic kidney disease: Secondary | ICD-10-CM | POA: Diagnosis not present

## 2016-11-09 DIAGNOSIS — N186 End stage renal disease: Secondary | ICD-10-CM | POA: Diagnosis not present

## 2016-11-11 DIAGNOSIS — N2581 Secondary hyperparathyroidism of renal origin: Secondary | ICD-10-CM | POA: Diagnosis not present

## 2016-11-11 DIAGNOSIS — N186 End stage renal disease: Secondary | ICD-10-CM | POA: Diagnosis not present

## 2016-11-11 DIAGNOSIS — D509 Iron deficiency anemia, unspecified: Secondary | ICD-10-CM | POA: Diagnosis not present

## 2016-11-11 DIAGNOSIS — D631 Anemia in chronic kidney disease: Secondary | ICD-10-CM | POA: Diagnosis not present

## 2016-11-13 ENCOUNTER — Emergency Department
Admission: EM | Admit: 2016-11-13 | Discharge: 2016-11-13 | Disposition: A | Discharge status: Home / Self Care | Payer: Medicare Other | Attending: Emergency Medicine | Admitting: Emergency Medicine

## 2016-11-13 ENCOUNTER — Other Ambulatory Visit: Payer: Self-pay

## 2016-11-13 DIAGNOSIS — Y732 Prosthetic and other implants, materials and accessory gastroenterology and urology devices associated with adverse incidents: Secondary | ICD-10-CM | POA: Insufficient documentation

## 2016-11-13 DIAGNOSIS — Z7982 Long term (current) use of aspirin: Secondary | ICD-10-CM | POA: Diagnosis not present

## 2016-11-13 DIAGNOSIS — E1122 Type 2 diabetes mellitus with diabetic chronic kidney disease: Secondary | ICD-10-CM | POA: Insufficient documentation

## 2016-11-13 DIAGNOSIS — Z7902 Long term (current) use of antithrombotics/antiplatelets: Secondary | ICD-10-CM | POA: Insufficient documentation

## 2016-11-13 DIAGNOSIS — E039 Hypothyroidism, unspecified: Secondary | ICD-10-CM | POA: Insufficient documentation

## 2016-11-13 DIAGNOSIS — Z79899 Other long term (current) drug therapy: Secondary | ICD-10-CM | POA: Diagnosis not present

## 2016-11-13 DIAGNOSIS — N186 End stage renal disease: Secondary | ICD-10-CM | POA: Diagnosis not present

## 2016-11-13 DIAGNOSIS — Z992 Dependence on renal dialysis: Secondary | ICD-10-CM | POA: Insufficient documentation

## 2016-11-13 DIAGNOSIS — I2510 Atherosclerotic heart disease of native coronary artery without angina pectoris: Secondary | ICD-10-CM | POA: Diagnosis not present

## 2016-11-13 DIAGNOSIS — I132 Hypertensive heart and chronic kidney disease with heart failure and with stage 5 chronic kidney disease, or end stage renal disease: Secondary | ICD-10-CM | POA: Insufficient documentation

## 2016-11-13 DIAGNOSIS — I509 Heart failure, unspecified: Secondary | ICD-10-CM | POA: Insufficient documentation

## 2016-11-13 DIAGNOSIS — D631 Anemia in chronic kidney disease: Secondary | ICD-10-CM | POA: Diagnosis not present

## 2016-11-13 DIAGNOSIS — T8249XA Other complication of vascular dialysis catheter, initial encounter: Secondary | ICD-10-CM | POA: Diagnosis not present

## 2016-11-13 DIAGNOSIS — I10 Essential (primary) hypertension: Secondary | ICD-10-CM

## 2016-11-13 DIAGNOSIS — T829XXA Unspecified complication of cardiac and vascular prosthetic device, implant and graft, initial encounter: Secondary | ICD-10-CM

## 2016-11-13 DIAGNOSIS — Z87891 Personal history of nicotine dependence: Secondary | ICD-10-CM | POA: Diagnosis not present

## 2016-11-13 DIAGNOSIS — N2581 Secondary hyperparathyroidism of renal origin: Secondary | ICD-10-CM | POA: Diagnosis not present

## 2016-11-13 DIAGNOSIS — R58 Hemorrhage, not elsewhere classified: Secondary | ICD-10-CM | POA: Diagnosis not present

## 2016-11-13 DIAGNOSIS — D509 Iron deficiency anemia, unspecified: Secondary | ICD-10-CM | POA: Diagnosis not present

## 2016-11-13 MED ORDER — METOPROLOL TARTRATE 50 MG PO TABS
50.0000 mg | ORAL_TABLET | Freq: Once | ORAL | Status: AC
  Administered 2016-11-13: 50 mg via ORAL
  Filled 2016-11-13: qty 1

## 2016-11-13 MED ORDER — HYDRALAZINE HCL 50 MG PO TABS
25.0000 mg | ORAL_TABLET | Freq: Once | ORAL | Status: AC
  Administered 2016-11-13: 25 mg via ORAL
  Filled 2016-11-13: qty 1

## 2016-11-13 MED ORDER — CLONIDINE HCL 0.1 MG PO TABS
0.1000 mg | ORAL_TABLET | Freq: Once | ORAL | Status: AC
  Administered 2016-11-13: 0.1 mg via ORAL
  Filled 2016-11-13: qty 1

## 2016-11-13 NOTE — ED Triage Notes (Addendum)
EMS reported patient brought in due to her AV fistula (Standard above elbow Brachiocephalic AV Fistula, left arm) would not stop bleeding after dialysis today and that this had happened previously. The dialysis office placed a clamp on her left AV fistula site to help stop the bleeding.  Patient is very hard of hearing but can read lips, she left her hearing aids at home.  Patient states she goes to dialysis on Mon, Wed & Fridays  Patient is A & O x 4, lives at  Home and cares for her self

## 2016-11-13 NOTE — Discharge Instructions (Signed)
Your fistula stopped bleeding in the ER.  Be sure to take all blood pressure medications as prescribed to control your blood pressure.  Very high blood pressure can make your fistula more prone to bleeding.  You also need to follow up with Vascular Surgery on Monday for evaluation of your fistula.  Your recent bleeding may be due to a blocking in the blood vessels that needs to be opened up.

## 2016-11-13 NOTE — ED Notes (Signed)
MD at bedside, removed clamp from left AV fistula

## 2016-11-13 NOTE — ED Provider Notes (Signed)
[facility] Emergency Department Provider Note  ____________________________________________  Time seen: Approximately 7:09 PM  I have reviewed the triage vital signs and the nursing notes.   HISTORY  Chief Complaint Coagulation Disorder (AV Fistula Left AC, bleeding after dialysis )    HPI [name] is a 83 y.o. female with a history of hypertension and end-stage renal disease on dialysis sent to the ED from dialysis due to continued bleeding from AV fistula after she completed her dialysis session and was decannulated. Patient denies any complaints whatsoever. No chest pain or shortness of breath. No fevers or chills. No recent trauma. He reports compliance with her medications. Comes with a MAR with her.     Past Medical History:  Diagnosis Date  . Anemia    NOS  . Anxiety   . Aortic stenosis   . Cancer [facility])    Mole on top of head to be removed in July 2013  . CHF (congestive heart failure) ([place])   . Constipation   . Coronary artery disease   . Diabetes mellitus    type II;was on Glipizide but has been off x 70mon  . Dialysis patient ([place])   . Diarrhea   . GERD (gastroesophageal reflux disease)   . History of blood transfusion   . Hyperlipidemia    takes Simvastatin nightly  . Hyperlipidemia   . Hypertension    takes Metoprolol daily  . Hypothyroidism    takes Synthroid daily  . Migraine    hx of  . Oligouria   . Osteoarthritis   . Osteopenia   . Peripheral vascular disease ([place])   . Personal history of colonic polyps   . Pulmonary hypertension ([place])   . Renal insufficiency   . Urinary incontinence   . UTI (urinary tract infection)      Patient Active Problem List   Diagnosis Date Noted  . Cerebrovascular accident (CVA) due to occlusion of right middle cerebral artery ([place]) 10/24/2015  . Malnutrition of mild degree ([place]) 10/24/2015  . Altered mental status 09/18/2015  . Preventative health care 05/03/2014  . Peripheral  vascular disease ([place]) 12/21/2013  . Toe amputation status ([place]) 08/29/2013  . History of recent fall 04/18/2013  . HTN (hypertension), malignant 03/22/2013  . S/P aortic valve replacement with bioprosthetic valve 11/15/2012  . S/P CABG x 3 11/15/2012  . Anemia of chronic kidney failure 12/22/2010  . Thrombocytopenia ([place]) 12/13/2010  . End stage renal disease on dialysis ([place]) 12/13/2010  . S/P aortic valve replacement 08/11/2010  . Arterial insufficiency-lower 07/25/2010  . NEURODERMATITIS 04/08/2009  . Carotid arterial disease ([place]) 01/01/2009  . Coronary atherosclerosis 02/11/2007  . AORTIC STENOSIS 10/04/2006  . ANXIETY 05/30/2006  . Pulmonary hypertension ([place]) 05/30/2006  . Hypothyroidism 05/27/2006  . Type 2 diabetes mellitus with renal manifestations, controlled ([place]) 05/27/2006  . Hyperlipidemia 05/27/2006  . Essential hypertension 05/27/2006  . GERD 05/27/2006  . OSTEOARTHRITIS 05/27/2006  . OSTEOPENIA 05/27/2006  . URINARY INCONTINENCE 05/27/2006     Past Surgical History:  Procedure Laterality Date  . ABDOMINAL HYSTERECTOMY    . adenosine myoview  2007   benign, EF 69%  . Amputation-left great toe  7/12   Dr Marlou Sa  . AORTIC VALVE REPLACEMENT  2009  . APPENDECTOMY    . CARDIAC CATHETERIZATION  2000   cad  . CAROTID ENDARTERECTOMY  2011   Right  . CATARACT EXTRACTION    . CORONARY ARTERY BYPASS GRAFT     od  .  CORONARY ARTERY BYPASS GRAFT  2009  . EYE SURGERY     BIlateral  . REPLACEMENT TOTAL KNEE BILATERAL  05/1998  . TONSILLECTOMY    . Traumatic Amputation of Right DIP joint of Index Finger       Prior to Admission medications   Medication Sig Start Date End Date Taking? Authorizing Provider  aspirin EC 81 MG tablet Take 81 mg by mouth at bedtime.   Yes [provider]  cloNIDine (CATAPRES) 0.1 MG tablet Take 1 tablet (0.1 mg total) by mouth 2 (two) times daily. 02/13/16  Yes [name], MD  clopidogrel (PLAVIX) 75 MG tablet Take 1  tablet (75 mg total) by mouth daily. 01/20/16  Yes [name], MD  folic acid (FOLVITE) 0.5 MG tablet Take 0.5 tablets (0.5 mg total) by mouth daily. Patient taking differently: Take 1 mg daily by mouth.  12/06/12  Yes Vann, Jessica U, DO  hydrALAZINE (APRESOLINE) 25 MG tablet TAKE ONE TABLET BY MOUTH EVERY 8 HOURS. 04/01/16  Yes [name], MD  levothyroxine (SYNTHROID, LEVOTHROID) 75 MCG tablet TAKE ONE TABLET BY MOUTH ONCE DAILY 07/07/16  Yes [name], MD  metoprolol (LOPRESSOR) 50 MG tablet Take 1 tablet (50 mg total) by mouth 2 (two) times daily. 11/25/15  Yes Gollan, Kathlene November, MD  pravastatin (PRAVACHOL) 40 MG tablet TAKE ONE TABLET BY MOUTH IN THE EVENING 07/21/16  Yes Gollan, Kathlene November, MD  vitamin C (VITAMIN C) 500 MG tablet Take 1 tablet (500 mg total) by mouth daily. 12/06/12  Yes Vann, Jessica U, DO  amLODipine (NORVASC) 5 MG tablet Take 1 tablet (5 mg total) by mouth daily. Patient not taking: Reported on 11/13/2016 11/12/15   [name], MD  calcium carbonate (TUMS - DOSED IN MG ELEMENTAL CALCIUM) 500 MG chewable tablet Chew 1 tablet (200 mg of elemental calcium total) by mouth 3 (three) times daily with meals. 12/06/12   Geradine Girt, DO  polyvinyl alcohol (LIQUIFILM TEARS) 1.4 % ophthalmic solution Place 1 drop into both eyes as needed for dry eyes. Patient not taking: Reported on 11/13/2016 09/22/15   Fritzi Mandes, MD     Allergies Nitrofurantoin; Captopril; Enalapril maleate; Ramipril; Sulfa antibiotics; and Verapamil   Family History  Family history unknown: Yes    Social History Social History   Tobacco Use  . Smoking status: Former Smoker    Years: 15.00    Types: Cigarettes    Last attempt to quit: 01/06/1943    Years since quitting: 73.9  . Smokeless tobacco: Never Used  Substance Use Topics  . Alcohol use: Yes    Comment: Wine occasionally  . Drug use: No    Review of Systems  Constitutional:   No fever or chills.  ENT:   No sore  throat. No rhinorrhea. Cardiovascular:   No chest pain or syncope. Respiratory:   No dyspnea or cough. Gastrointestinal:   Negative for abdominal pain, vomiting and diarrhea.  Musculoskeletal:   Negative for focal pain or swelling All other systems reviewed and are negative except as documented above in ROS and HPI.  ____________________________________________   PHYSICAL EXAM:  VITAL SIGNS: ED Triage Vitals  Enc Vitals Group     BP 11/13/16 1722 (!) 219/67     Pulse Rate 11/13/16 1722 89     Resp 11/13/16 1722 18     Temp 11/13/16 1722 97.7 F (36.5 C)     Temp Source 11/13/16 1722 Oral  SpO2 11/13/16 1722 96 %     Weight 11/13/16 1726 98 lb (44.5 kg)     Height 11/13/16 1726 5\' 2"  (1.575 m)     Head Circumference --      Peak Flow --      Pain Score --      Pain Loc --      Pain Edu? --      Excl. in [place]? --     Vital signs reviewed, nursing assessments reviewed.   Constitutional:   Alert and oriented. Well appearing and in no distress. Eyes:   No scleral icterus.  EOMI. No nystagmus. No conjunctival pallor. PERRL. ENT   Head:   Normocephalic and atraumatic.   Nose:   No congestion/rhinnorhea.    Mouth/Throat:   MMM, no pharyngeal erythema. No peritonsillar mass.    Neck:   No meningismus. Full ROM. Hematological/Lymphatic/Immunilogical:   No cervical lymphadenopathy. Cardiovascular:   RRR. Symmetric bilateral radial and DP pulses.  No murmurs. left upper arm AV fistula present. Bounding pulses, strong thrill. Clamp in place from dialysis. The clamp was removed gently on exam exposing a small piece of gauze taped in place over the access site. It is hemostatic without the clamp. Respiratory:   Normal respiratory effort without tachypnea/retractions. Breath sounds are clear and equal bilaterally. No wheezes/rales/rhonchi. Gastrointestinal:   Soft and nontender. Non distended. There is no CVA tenderness.  No rebound, rigidity, or guarding. Genitourinary:    deferred Musculoskeletal:   Normal range of motion in all extremities. No joint effusions.  No lower extremity tenderness.  No edema. Neurologic:   Normal speech and language.  Motor grossly intact. No gross focal neurologic deficits are appreciated.  Skin:    Skin is warm, dry and intact. No rash noted.  No petechiae, purpura, or bullae.  ____________________________________________    LABS (pertinent positives/negatives) (all labs ordered are listed, but only abnormal results are displayed) Labs Reviewed - No data to display ____________________________________________   EKG    ____________________________________________    RADIOLOGY  No results found.  ____________________________________________   PROCEDURES Procedures  ____________________________________________     CLINICAL IMPRESSION / ASSESSMENT AND PLAN / ED COURSE  Pertinent labs & imaging results that were available during my care of the patient were reviewed by me and considered in my medical decision making (see chart for details).   patient presents with report of bleeding after dialysis session from her AV fistula. On arrival to the ED, it is hemostatic. She is severely hypertensive, 220/70. She was given her usual evening blood pressure medicines, improving her blood pressure 165/70. Remains hemostatic, remains asymptomatic. Discussed with vascular surgery for outpatient follow-up. Patient will call them on Monday to schedule an appointment soon for fistulogram. No evidence of thrombosis or infection at this time. Suspect a proximal stenosis.      ____________________________________________   FINAL CLINICAL IMPRESSION(S) / ED DIAGNOSES    Final diagnoses:  Complication of vascular access for dialysis, initial encounter  Severe hypertension      This SmartLink is deprecated. Use AVSMEDLIST instead to display the medication list for a patient.   Portions of this note were generated  with dragon dictation software. Dictation errors may occur despite best attempts at proofreading.    Carrie Mew, MD 11/13/16 [phone]

## 2016-11-16 DIAGNOSIS — N2581 Secondary hyperparathyroidism of renal origin: Secondary | ICD-10-CM | POA: Diagnosis not present

## 2016-11-16 DIAGNOSIS — N186 End stage renal disease: Secondary | ICD-10-CM | POA: Diagnosis not present

## 2016-11-16 DIAGNOSIS — D509 Iron deficiency anemia, unspecified: Secondary | ICD-10-CM | POA: Diagnosis not present

## 2016-11-16 DIAGNOSIS — D631 Anemia in chronic kidney disease: Secondary | ICD-10-CM | POA: Diagnosis not present

## 2016-11-18 DIAGNOSIS — N2581 Secondary hyperparathyroidism of renal origin: Secondary | ICD-10-CM | POA: Diagnosis not present

## 2016-11-18 DIAGNOSIS — D509 Iron deficiency anemia, unspecified: Secondary | ICD-10-CM | POA: Diagnosis not present

## 2016-11-18 DIAGNOSIS — N186 End stage renal disease: Secondary | ICD-10-CM | POA: Diagnosis not present

## 2016-11-18 DIAGNOSIS — D631 Anemia in chronic kidney disease: Secondary | ICD-10-CM | POA: Diagnosis not present

## 2016-11-20 DIAGNOSIS — D509 Iron deficiency anemia, unspecified: Secondary | ICD-10-CM | POA: Diagnosis not present

## 2016-11-20 DIAGNOSIS — N186 End stage renal disease: Secondary | ICD-10-CM | POA: Diagnosis not present

## 2016-11-20 DIAGNOSIS — D631 Anemia in chronic kidney disease: Secondary | ICD-10-CM | POA: Diagnosis not present

## 2016-11-20 DIAGNOSIS — N2581 Secondary hyperparathyroidism of renal origin: Secondary | ICD-10-CM | POA: Diagnosis not present

## 2016-11-23 DIAGNOSIS — D631 Anemia in chronic kidney disease: Secondary | ICD-10-CM | POA: Diagnosis not present

## 2016-11-23 DIAGNOSIS — N186 End stage renal disease: Secondary | ICD-10-CM | POA: Diagnosis not present

## 2016-11-23 DIAGNOSIS — D509 Iron deficiency anemia, unspecified: Secondary | ICD-10-CM | POA: Diagnosis not present

## 2016-11-23 DIAGNOSIS — N2581 Secondary hyperparathyroidism of renal origin: Secondary | ICD-10-CM | POA: Diagnosis not present

## 2016-11-25 DIAGNOSIS — D631 Anemia in chronic kidney disease: Secondary | ICD-10-CM | POA: Diagnosis not present

## 2016-11-25 DIAGNOSIS — D509 Iron deficiency anemia, unspecified: Secondary | ICD-10-CM | POA: Diagnosis not present

## 2016-11-25 DIAGNOSIS — N2581 Secondary hyperparathyroidism of renal origin: Secondary | ICD-10-CM | POA: Diagnosis not present

## 2016-11-25 DIAGNOSIS — N186 End stage renal disease: Secondary | ICD-10-CM | POA: Diagnosis not present

## 2016-11-27 DIAGNOSIS — N2581 Secondary hyperparathyroidism of renal origin: Secondary | ICD-10-CM | POA: Diagnosis not present

## 2016-11-27 DIAGNOSIS — N186 End stage renal disease: Secondary | ICD-10-CM | POA: Diagnosis not present

## 2016-11-27 DIAGNOSIS — D631 Anemia in chronic kidney disease: Secondary | ICD-10-CM | POA: Diagnosis not present

## 2016-11-27 DIAGNOSIS — D509 Iron deficiency anemia, unspecified: Secondary | ICD-10-CM | POA: Diagnosis not present

## 2016-11-30 ENCOUNTER — Other Ambulatory Visit: Payer: Self-pay

## 2016-11-30 ENCOUNTER — Emergency Department
Admission: EM | Admit: 2016-11-30 | Discharge: 2016-11-30 | Disposition: A | Discharge status: Home / Self Care | Payer: Medicare Other | Attending: Emergency Medicine | Admitting: Emergency Medicine

## 2016-11-30 DIAGNOSIS — Z951 Presence of aortocoronary bypass graft: Secondary | ICD-10-CM | POA: Diagnosis not present

## 2016-11-30 DIAGNOSIS — E1122 Type 2 diabetes mellitus with diabetic chronic kidney disease: Secondary | ICD-10-CM | POA: Diagnosis not present

## 2016-11-30 DIAGNOSIS — Y828 Other medical devices associated with adverse incidents: Secondary | ICD-10-CM | POA: Diagnosis not present

## 2016-11-30 DIAGNOSIS — I132 Hypertensive heart and chronic kidney disease with heart failure and with stage 5 chronic kidney disease, or end stage renal disease: Secondary | ICD-10-CM | POA: Insufficient documentation

## 2016-11-30 DIAGNOSIS — E039 Hypothyroidism, unspecified: Secondary | ICD-10-CM | POA: Diagnosis not present

## 2016-11-30 DIAGNOSIS — Z87891 Personal history of nicotine dependence: Secondary | ICD-10-CM | POA: Insufficient documentation

## 2016-11-30 DIAGNOSIS — N2581 Secondary hyperparathyroidism of renal origin: Secondary | ICD-10-CM | POA: Diagnosis not present

## 2016-11-30 DIAGNOSIS — D509 Iron deficiency anemia, unspecified: Secondary | ICD-10-CM | POA: Diagnosis not present

## 2016-11-30 DIAGNOSIS — I2510 Atherosclerotic heart disease of native coronary artery without angina pectoris: Secondary | ICD-10-CM | POA: Diagnosis not present

## 2016-11-30 DIAGNOSIS — D631 Anemia in chronic kidney disease: Secondary | ICD-10-CM | POA: Diagnosis not present

## 2016-11-30 DIAGNOSIS — N186 End stage renal disease: Secondary | ICD-10-CM | POA: Insufficient documentation

## 2016-11-30 DIAGNOSIS — Z992 Dependence on renal dialysis: Secondary | ICD-10-CM | POA: Insufficient documentation

## 2016-11-30 DIAGNOSIS — R58 Hemorrhage, not elsewhere classified: Secondary | ICD-10-CM | POA: Diagnosis not present

## 2016-11-30 DIAGNOSIS — I509 Heart failure, unspecified: Secondary | ICD-10-CM | POA: Diagnosis not present

## 2016-11-30 DIAGNOSIS — T82838A Hemorrhage of vascular prosthetic devices, implants and grafts, initial encounter: Secondary | ICD-10-CM | POA: Insufficient documentation

## 2016-11-30 NOTE — ED Provider Notes (Signed)
[facility] Emergency Department Provider Note  ____________________________________________  Time seen: Approximately 4:26 PM  I have reviewed the triage vital signs and the nursing notes.   HISTORY  Chief Complaint fistula site bleeding    HPI [name] is a 83 y.o. female with ESRD on HD, on Plavix, presenting for bleeding left upper extremity fistula.  The patient has a left AV fistula, and after removal of the first of 2 dialysis needles, she had uncontrolled bleeding so the paramedics were called.  A clamp was placed over the bleeding site and the patient was transported to the hospital.  The patient has no complaints, including shortness of breath, lightheadedness or syncope.  Past Medical History:  Diagnosis Date  . Anemia    NOS  . Anxiety   . Aortic stenosis   . Cancer [facility])    Mole on top of head to be removed in July 2013  . CHF (congestive heart failure) ([place])   . Constipation   . Coronary artery disease   . Diabetes mellitus    type II;was on Glipizide but has been off x 15mon  . Dialysis patient ([place])   . Diarrhea   . GERD (gastroesophageal reflux disease)   . History of blood transfusion   . Hyperlipidemia    takes Simvastatin nightly  . Hyperlipidemia   . Hypertension    takes Metoprolol daily  . Hypothyroidism    takes Synthroid daily  . Migraine    hx of  . Oligouria   . Osteoarthritis   . Osteopenia   . Peripheral vascular disease ([place])   . Personal history of colonic polyps   . Pulmonary hypertension ([place])   . Renal insufficiency   . Urinary incontinence   . UTI (urinary tract infection)     Patient Active Problem List   Diagnosis Date Noted  . Cerebrovascular accident (CVA) due to occlusion of right middle cerebral artery ([place]) 10/24/2015  . Malnutrition of mild degree ([place]) 10/24/2015  . Altered mental status 09/18/2015  . Preventative health care 05/03/2014  . Peripheral vascular disease ([place]) 12/21/2013  .  Toe amputation status ([place]) 08/29/2013  . History of recent fall 04/18/2013  . HTN (hypertension), malignant 03/22/2013  . S/P aortic valve replacement with bioprosthetic valve 11/15/2012  . S/P CABG x 3 11/15/2012  . Anemia of chronic kidney failure 12/22/2010  . Thrombocytopenia ([place]) 12/13/2010  . End stage renal disease on dialysis ([place]) 12/13/2010  . S/P aortic valve replacement 08/11/2010  . Arterial insufficiency-lower 07/25/2010  . NEURODERMATITIS 04/08/2009  . Carotid arterial disease ([place]) 01/01/2009  . Coronary atherosclerosis 02/11/2007  . AORTIC STENOSIS 10/04/2006  . ANXIETY 05/30/2006  . Pulmonary hypertension ([place]) 05/30/2006  . Hypothyroidism 05/27/2006  . Type 2 diabetes mellitus with renal manifestations, controlled ([place]) 05/27/2006  . Hyperlipidemia 05/27/2006  . Essential hypertension 05/27/2006  . GERD 05/27/2006  . OSTEOARTHRITIS 05/27/2006  . OSTEOPENIA 05/27/2006  . URINARY INCONTINENCE 05/27/2006    Past Surgical History:  Procedure Laterality Date  . ABDOMINAL HYSTERECTOMY    . adenosine myoview  2007   benign, EF 69%  . AMPUTATION  06/18/2011   Procedure: AMPUTATION DIGIT;  Surgeon: Newt Minion, MD;  Location: [place];  Service: Orthopedics;  Laterality: Left;  Left 2nd Toe Amputation at MTP Joint  . Amputation-left great toe  7/12   Dr Marlou Sa  . AORTIC VALVE REPLACEMENT  2009  . APPENDECTOMY    . AV FISTULA PLACEMENT  12/22/2010  Procedure: ARTERIOVENOUS (AV) FISTULA CREATION;  Surgeon: Hinda Lenis, MD;  Location: [place];  Service: Vascular;  Laterality: Left;  Creation of Left Brachial-Cephalic Fistula  . CARDIAC CATHETERIZATION  2000   cad  . CAROTID ENDARTERECTOMY  2011   Right  . CATARACT EXTRACTION    . CORONARY ARTERY BYPASS GRAFT     od  . CORONARY ARTERY BYPASS GRAFT  2009  . EYE SURGERY     BIlateral  . INSERTION OF DIALYSIS CATHETER  12/18/2010   Procedure: INSERTION OF DIALYSIS CATHETER;  Surgeon: Mal Misty, MD;   Location: [place];  Service: Vascular;  Laterality: Right;  . REPLACEMENT TOTAL KNEE BILATERAL  05/1998  . TONSILLECTOMY    . Traumatic Amputation of Right DIP joint of Index Finger      Current Outpatient Rx  . Order #: [id number] Class: Normal  . Order #: [id number] Class: Historical Med  . Order #: [id number] Class: No Print  . Order #: [id number] Class: Normal  . Order #: [id number] Class: Normal  . Order #: [id number] Class: No Print  . Order #: [id number] Class: Normal  . Order #: [id number] Class: Normal  . Order #: [id number] Class: Normal  . Order #: [id number] Class: Normal  . Order #: [id number] Class: Normal  . Order #: [id number] Class: No Print    Allergies Nitrofurantoin; Captopril; Enalapril maleate; Ramipril; Sulfa antibiotics; and Verapamil  Family History  Family history unknown: Yes    Social History Social History   Tobacco Use  . Smoking status: Former Smoker    Years: 15.00    Types: Cigarettes    Last attempt to quit: 01/06/1943    Years since quitting: 73.9  . Smokeless tobacco: Never Used  Substance Use Topics  . Alcohol use: Yes    Comment: Wine occasionally  . Drug use: No    Review of Systems Constitutional: No fever/chills.  No lightheadedness or syncope. Eyes: No eye discharge. ENT: Positive hard of hearing. Cardiovascular: Denies chest pain. Denies palpitations. Respiratory: Denies shortness of breath.  Skin: Negative for rash. Neurological: Negative for headaches. No focal numbness, tingling or weakness.  Hematological/Lymphatic:Positive bleeding left AV fistula.    ____________________________________________   PHYSICAL EXAM:  VITAL SIGNS: ED Triage Vitals  Enc Vitals Group     BP --      Pulse Rate 11/30/16 1623 71     Resp 11/30/16 1623 17     Temp 11/30/16 1623 97.7 F (36.5 C)     Temp Source 11/30/16 1623 Oral     SpO2 11/30/16 1623 96 %     Weight 11/30/16 1624 98 lb (44.5 kg)     Height 11/30/16 1624 5\' 2"  (1.575 m)     Head  Circumference --      Peak Flow --      Pain Score --      Pain Loc --      Pain Edu? --      Excl. in [place]? --     Constitutional: Alert and oriented. Well appearing for her age and in no acute distress. Answers questions appropriately. Eyes: Conjunctivae are normal.  EOMI. No scleral icterus. Head: Atraumatic. Nose: No congestion/rhinnorhea. Mouth/Throat: Mucous membranes are moist.  Neck: No stridor.  Supple.   Cardiovascular: Normal rate Respiratory: Normal respiratory effort.   Musculoskeletal: Left upper extremity has a palpable fistula with normal thrill.  The patient has a needle insertion site that is hemostatic after clamp was removed.  The patient has an indwelling left needle.  There is no  overlying erythema, swelling, fluctuance, or purulent discharge. Neurologic:  Alert.  Speech is clear.  Face and smile are symmetric.  EOMI.  Moves all extremities well. Skin:  Skin is warm, dry. No rash noted. Psychiatric: Mood and affect are normal. Speech and behavior are normal.  Normal judgement.  ____________________________________________   LABS (all labs ordered are listed, but only abnormal results are displayed)  Labs Reviewed - No data to display ____________________________________________  EKG  Not indicated ____________________________________________  RADIOLOGY  No results found.  ____________________________________________   PROCEDURES  Procedure(s) performed: None  Procedures  Critical Care performed: No ____________________________________________   INITIAL IMPRESSION / ASSESSMENT AND PLAN / ED COURSE  Pertinent labs & imaging results that were available during my care of the patient were reviewed by me and considered in my medical decision making (see chart for details).  83 y.o. female on Plavix with bleeding in her left AV fistula after dialysis, now hemostatic.  The patient is hypertensive but we will check her blood pressure, and she has no  signs or symptoms of hypertensive urgency or emergency.  The dialysis center has been called and will come remove the patient's catheter for discharge.  ____________________________________________  FINAL CLINICAL IMPRESSION(S) / ED DIAGNOSES  Final diagnoses:  Bleeding pseudoaneurysm of left brachiocephalic AV fistula (HCC)         NEW MEDICATIONS STARTED DURING THIS VISIT:  This SmartLink is deprecated. Use AVSMEDLIST instead to display the medication list for a patient.    Eula Listen, MD 11/30/16 [phone]

## 2016-11-30 NOTE — ED Notes (Signed)
Pt has dialysis needle X 1 in left upper arm. Site that needle had been in of concern for bleeding is not bleeding at this time.

## 2016-11-30 NOTE — ED Triage Notes (Addendum)
Pt was at dialysis today and when they removed one of the needles she has bleeding and they did not remove the other due to concerns that the site would not stop bleeding. EDP at the bedside removed the clamp and bandage with no noted bleeding at present.

## 2016-11-30 NOTE — ED Notes (Signed)
Report to David, RN.

## 2016-11-30 NOTE — Discharge Instructions (Signed)
Please return to the emergency department for bleeding, lightheadedness, shortness of breath, or any other symptoms concerning to you.  Please do not take your Plavix today, and restart tomorrow.

## 2016-11-30 NOTE — ED Notes (Signed)
Pt awaiting for dialysis staff to remove the remaining dialysis needle.

## 2016-12-02 DIAGNOSIS — D509 Iron deficiency anemia, unspecified: Secondary | ICD-10-CM | POA: Diagnosis not present

## 2016-12-02 DIAGNOSIS — N186 End stage renal disease: Secondary | ICD-10-CM | POA: Diagnosis not present

## 2016-12-02 DIAGNOSIS — N2581 Secondary hyperparathyroidism of renal origin: Secondary | ICD-10-CM | POA: Diagnosis not present

## 2016-12-02 DIAGNOSIS — D631 Anemia in chronic kidney disease: Secondary | ICD-10-CM | POA: Diagnosis not present

## 2016-12-04 DIAGNOSIS — D509 Iron deficiency anemia, unspecified: Secondary | ICD-10-CM | POA: Diagnosis not present

## 2016-12-04 DIAGNOSIS — N2581 Secondary hyperparathyroidism of renal origin: Secondary | ICD-10-CM | POA: Diagnosis not present

## 2016-12-04 DIAGNOSIS — N186 End stage renal disease: Secondary | ICD-10-CM | POA: Diagnosis not present

## 2016-12-04 DIAGNOSIS — D631 Anemia in chronic kidney disease: Secondary | ICD-10-CM | POA: Diagnosis not present

## 2016-12-04 DIAGNOSIS — I120 Hypertensive chronic kidney disease with stage 5 chronic kidney disease or end stage renal disease: Secondary | ICD-10-CM | POA: Diagnosis not present

## 2016-12-04 DIAGNOSIS — Z992 Dependence on renal dialysis: Secondary | ICD-10-CM | POA: Diagnosis not present

## 2016-12-07 DIAGNOSIS — N2581 Secondary hyperparathyroidism of renal origin: Secondary | ICD-10-CM | POA: Diagnosis not present

## 2016-12-07 DIAGNOSIS — N186 End stage renal disease: Secondary | ICD-10-CM | POA: Diagnosis not present

## 2016-12-07 DIAGNOSIS — D631 Anemia in chronic kidney disease: Secondary | ICD-10-CM | POA: Diagnosis not present

## 2016-12-07 DIAGNOSIS — D509 Iron deficiency anemia, unspecified: Secondary | ICD-10-CM | POA: Diagnosis not present

## 2016-12-07 DIAGNOSIS — E119 Type 2 diabetes mellitus without complications: Secondary | ICD-10-CM | POA: Diagnosis not present

## 2016-12-08 DIAGNOSIS — E119 Type 2 diabetes mellitus without complications: Secondary | ICD-10-CM | POA: Diagnosis not present

## 2016-12-08 LAB — HM DIABETES EYE EXAM

## 2016-12-09 DIAGNOSIS — N186 End stage renal disease: Secondary | ICD-10-CM | POA: Diagnosis not present

## 2016-12-09 DIAGNOSIS — E119 Type 2 diabetes mellitus without complications: Secondary | ICD-10-CM | POA: Diagnosis not present

## 2016-12-09 DIAGNOSIS — D631 Anemia in chronic kidney disease: Secondary | ICD-10-CM | POA: Diagnosis not present

## 2016-12-09 DIAGNOSIS — N2581 Secondary hyperparathyroidism of renal origin: Secondary | ICD-10-CM | POA: Diagnosis not present

## 2016-12-09 DIAGNOSIS — D509 Iron deficiency anemia, unspecified: Secondary | ICD-10-CM | POA: Diagnosis not present

## 2016-12-11 DIAGNOSIS — N186 End stage renal disease: Secondary | ICD-10-CM | POA: Diagnosis not present

## 2016-12-11 DIAGNOSIS — D509 Iron deficiency anemia, unspecified: Secondary | ICD-10-CM | POA: Diagnosis not present

## 2016-12-11 DIAGNOSIS — E119 Type 2 diabetes mellitus without complications: Secondary | ICD-10-CM | POA: Diagnosis not present

## 2016-12-11 DIAGNOSIS — D631 Anemia in chronic kidney disease: Secondary | ICD-10-CM | POA: Diagnosis not present

## 2016-12-11 DIAGNOSIS — N2581 Secondary hyperparathyroidism of renal origin: Secondary | ICD-10-CM | POA: Diagnosis not present

## 2016-12-14 DIAGNOSIS — N2581 Secondary hyperparathyroidism of renal origin: Secondary | ICD-10-CM | POA: Diagnosis not present

## 2016-12-14 DIAGNOSIS — N186 End stage renal disease: Secondary | ICD-10-CM | POA: Diagnosis not present

## 2016-12-14 DIAGNOSIS — D509 Iron deficiency anemia, unspecified: Secondary | ICD-10-CM | POA: Diagnosis not present

## 2016-12-14 DIAGNOSIS — E119 Type 2 diabetes mellitus without complications: Secondary | ICD-10-CM | POA: Diagnosis not present

## 2016-12-14 DIAGNOSIS — D631 Anemia in chronic kidney disease: Secondary | ICD-10-CM | POA: Diagnosis not present

## 2016-12-15 DIAGNOSIS — D0439 Carcinoma in situ of skin of other parts of face: Secondary | ICD-10-CM | POA: Diagnosis not present

## 2016-12-15 DIAGNOSIS — L821 Other seborrheic keratosis: Secondary | ICD-10-CM | POA: Diagnosis not present

## 2016-12-15 DIAGNOSIS — D485 Neoplasm of uncertain behavior of skin: Secondary | ICD-10-CM | POA: Diagnosis not present

## 2016-12-15 DIAGNOSIS — Z85828 Personal history of other malignant neoplasm of skin: Secondary | ICD-10-CM | POA: Diagnosis not present

## 2016-12-15 DIAGNOSIS — L570 Actinic keratosis: Secondary | ICD-10-CM | POA: Diagnosis not present

## 2016-12-16 DIAGNOSIS — D509 Iron deficiency anemia, unspecified: Secondary | ICD-10-CM | POA: Diagnosis not present

## 2016-12-16 DIAGNOSIS — D631 Anemia in chronic kidney disease: Secondary | ICD-10-CM | POA: Diagnosis not present

## 2016-12-16 DIAGNOSIS — N2581 Secondary hyperparathyroidism of renal origin: Secondary | ICD-10-CM | POA: Diagnosis not present

## 2016-12-16 DIAGNOSIS — E119 Type 2 diabetes mellitus without complications: Secondary | ICD-10-CM | POA: Diagnosis not present

## 2016-12-16 DIAGNOSIS — N186 End stage renal disease: Secondary | ICD-10-CM | POA: Diagnosis not present

## 2016-12-17 ENCOUNTER — Ambulatory Visit (INDEPENDENT_AMBULATORY_CARE_PROVIDER_SITE_OTHER): Payer: Medicare Other | Admitting: Family

## 2016-12-17 ENCOUNTER — Encounter (INDEPENDENT_AMBULATORY_CARE_PROVIDER_SITE_OTHER): Payer: Self-pay | Admitting: Family

## 2016-12-17 VITALS — Ht 62.0 in | Wt 98.0 lb

## 2016-12-17 DIAGNOSIS — B351 Tinea unguium: Secondary | ICD-10-CM | POA: Diagnosis not present

## 2016-12-17 DIAGNOSIS — E1121 Type 2 diabetes mellitus with diabetic nephropathy: Secondary | ICD-10-CM | POA: Diagnosis not present

## 2016-12-17 NOTE — Progress Notes (Signed)
Office Visit Note   Patient: [name]           Date of Birth: September 22, 1920           MRN: [id number] Visit Date: 12/17/2016              Requested by: [name], MD [place], [place] [address] PCP: [name], MD  Chief Complaint  Patient presents with  . Left Foot - Nail Problem  . Right Foot - Nail Problem      HPI: The patient is a 83 year old woman who presents today for evaluation of bilateral feet. Son accompanies.   She has thickened and discolored onychomycotic nails times 8.   Assessment & Plan: Visit Diagnoses:  1. Onychomycosis   2. Controlled type 2 diabetes mellitus with diabetic nephropathy, without long-term current use of insulin (HCC)     Plan: Nails trimmed bilaterally today follow-up in office in 3 months.  Follow-Up Instructions: Return in about 3 months (around 03/17/2017).   Ortho Exam  Patient is alert, oriented, no adenopathy, well-dressed, normal affect, normal respiratory effort. Thickened and discolored onychomycotic nails 8. No ulcerations. no open areas. There is no drainage ulceration no erythema no sign of infection. She is unable to safely trim her own nails due to insensate neuropathy. These were trimmed today without incident.  Imaging: No results found.  Labs: Lab Results  Component Value Date   HGBA1C 5.5 08/13/2016   HGBA1C 5.9 02/13/2016   HGBA1C 5.8 05/21/2015   ESRSEDRATE 135 (H) 07/19/2010   ESRSEDRATE >140 (H) 05/18/2010   CRP 23.8 (H) 05/18/2010   REPTSTATUS 09/20/2015 FINAL 09/18/2015   GRAMSTAIN  07/18/2010    FEW WBC PRESENT,BOTH PMN AND MONONUCLEAR FEW GRAM POSITIVE COCCI IN PAIRS   GRAMSTAIN  07/18/2010    FEW WBC PRESENT,BOTH PMN AND MONONUCLEAR FEW GRAM POSITIVE COCCI IN PAIRS Performed at [facility]  07/18/2010    FEW WBC PRESENT,BOTH PMN AND MONONUCLEAR FEW GRAM POSITIVE COCCI IN PAIRS Performed at [facility] >=100,000  COLONIES/mL ESCHERICHIA COLI (A) 09/18/2015   LABORGA ESCHERICHIA COLI (A) 09/18/2015    Orders:  No orders of the defined types were placed in this encounter.  No orders of the defined types were placed in this encounter.    Procedures: No procedures performed  Clinical Data: No additional findings.  ROS:  Review of Systems  Constitutional: Negative for chills and fever.  Skin: Negative for color change and wound.    Objective: Vital Signs: Ht 5\' 2"  (1.575 m)   Wt 98 lb (44.5 kg)   BMI 17.92 kg/m   Specialty Comments:  No specialty comments available.  PMFS History: Patient Active Problem List   Diagnosis Date Noted  . Cerebrovascular accident (CVA) due to occlusion of right middle cerebral artery ([place]) 10/24/2015  . Malnutrition of mild degree ([place]) 10/24/2015  . Altered mental status 09/18/2015  . Preventative health care 05/03/2014  . Peripheral vascular disease ([place]) 12/21/2013  . Toe amputation status ([place]) 08/29/2013  . History of recent fall 04/18/2013  . HTN (hypertension), malignant 03/22/2013  . S/P aortic valve replacement with bioprosthetic valve 11/15/2012  . S/P CABG x 3 11/15/2012  . Anemia of chronic kidney failure 12/22/2010  . Thrombocytopenia ([place]) 12/13/2010  . End stage renal disease on dialysis ([place]) 12/13/2010  . S/P aortic valve replacement 08/11/2010  . Arterial insufficiency-lower 07/25/2010  . NEURODERMATITIS 04/08/2009  .  Carotid arterial disease ([place]) 01/01/2009  . Coronary atherosclerosis 02/11/2007  . AORTIC STENOSIS 10/04/2006  . ANXIETY 05/30/2006  . Pulmonary hypertension ([place]) 05/30/2006  . Hypothyroidism 05/27/2006  . Type 2 diabetes mellitus with renal manifestations, controlled ([place]) 05/27/2006  . Hyperlipidemia 05/27/2006  . Essential hypertension 05/27/2006  . GERD 05/27/2006  . OSTEOARTHRITIS 05/27/2006  . OSTEOPENIA 05/27/2006  . URINARY INCONTINENCE 05/27/2006   Past Medical History:  Diagnosis Date  .  Anemia    NOS  . Anxiety   . Aortic stenosis   . Cancer [facility])    Mole on top of head to be removed in July 2013  . CHF (congestive heart failure) ([place])   . Constipation   . Coronary artery disease   . Diabetes mellitus    type II;was on Glipizide but has been off x 58mon  . Dialysis patient ([place])   . Diarrhea   . GERD (gastroesophageal reflux disease)   . History of blood transfusion   . Hyperlipidemia    takes Simvastatin nightly  . Hyperlipidemia   . Hypertension    takes Metoprolol daily  . Hypothyroidism    takes Synthroid daily  . Migraine    hx of  . Oligouria   . Osteoarthritis   . Osteopenia   . Peripheral vascular disease ([place])   . Personal history of colonic polyps   . Pulmonary hypertension ([place])   . Renal insufficiency   . Urinary incontinence   . UTI (urinary tract infection)     Family History  Family history unknown: Yes    Past Surgical History:  Procedure Laterality Date  . ABDOMINAL HYSTERECTOMY    . adenosine myoview  2007   benign, EF 69%  . AMPUTATION  06/18/2011   Procedure: AMPUTATION DIGIT;  Surgeon: Newt Minion, MD;  Location: [place];  Service: Orthopedics;  Laterality: Left;  Left 2nd Toe Amputation at MTP Joint  . Amputation-left great toe  7/12   Dr Marlou Sa  . AORTIC VALVE REPLACEMENT  2009  . APPENDECTOMY    . AV FISTULA PLACEMENT  12/22/2010   Procedure: ARTERIOVENOUS (AV) FISTULA CREATION;  Surgeon: Hinda Lenis, MD;  Location: [place];  Service: Vascular;  Laterality: Left;  Creation of Left Brachial-Cephalic Fistula  . CARDIAC CATHETERIZATION  2000   cad  . CAROTID ENDARTERECTOMY  2011   Right  . CATARACT EXTRACTION    . CORONARY ARTERY BYPASS GRAFT     od  . CORONARY ARTERY BYPASS GRAFT  2009  . EYE SURGERY     BIlateral  . INSERTION OF DIALYSIS CATHETER  12/18/2010   Procedure: INSERTION OF DIALYSIS CATHETER;  Surgeon: Mal Misty, MD;  Location: [place];  Service: Vascular;  Laterality: Right;  . REPLACEMENT TOTAL  KNEE BILATERAL  05/1998  . TONSILLECTOMY    . Traumatic Amputation of Right DIP joint of Index Finger     Social History   Occupational History  . Occupation: Retired Armed forces training and education officer  Tobacco Use  . Smoking status: Former Smoker    Years: 15.00    Types: Cigarettes    Last attempt to quit: 01/06/1943    Years since quitting: 73.9  . Smokeless tobacco: Never Used  Substance and Sexual Activity  . Alcohol use: Yes    Comment: Wine occasionally  . Drug use: No  . Sexual activity: No

## 2016-12-18 DIAGNOSIS — N186 End stage renal disease: Secondary | ICD-10-CM | POA: Diagnosis not present

## 2016-12-18 DIAGNOSIS — N2581 Secondary hyperparathyroidism of renal origin: Secondary | ICD-10-CM | POA: Diagnosis not present

## 2016-12-18 DIAGNOSIS — D631 Anemia in chronic kidney disease: Secondary | ICD-10-CM | POA: Diagnosis not present

## 2016-12-18 DIAGNOSIS — E119 Type 2 diabetes mellitus without complications: Secondary | ICD-10-CM | POA: Diagnosis not present

## 2016-12-18 DIAGNOSIS — D509 Iron deficiency anemia, unspecified: Secondary | ICD-10-CM | POA: Diagnosis not present

## 2016-12-21 DIAGNOSIS — D631 Anemia in chronic kidney disease: Secondary | ICD-10-CM | POA: Diagnosis not present

## 2016-12-21 DIAGNOSIS — N186 End stage renal disease: Secondary | ICD-10-CM | POA: Diagnosis not present

## 2016-12-21 DIAGNOSIS — N2581 Secondary hyperparathyroidism of renal origin: Secondary | ICD-10-CM | POA: Diagnosis not present

## 2016-12-21 DIAGNOSIS — D509 Iron deficiency anemia, unspecified: Secondary | ICD-10-CM | POA: Diagnosis not present

## 2016-12-21 DIAGNOSIS — E119 Type 2 diabetes mellitus without complications: Secondary | ICD-10-CM | POA: Diagnosis not present

## 2016-12-23 DIAGNOSIS — D509 Iron deficiency anemia, unspecified: Secondary | ICD-10-CM | POA: Diagnosis not present

## 2016-12-23 DIAGNOSIS — D631 Anemia in chronic kidney disease: Secondary | ICD-10-CM | POA: Diagnosis not present

## 2016-12-23 DIAGNOSIS — N2581 Secondary hyperparathyroidism of renal origin: Secondary | ICD-10-CM | POA: Diagnosis not present

## 2016-12-23 DIAGNOSIS — E119 Type 2 diabetes mellitus without complications: Secondary | ICD-10-CM | POA: Diagnosis not present

## 2016-12-23 DIAGNOSIS — N186 End stage renal disease: Secondary | ICD-10-CM | POA: Diagnosis not present

## 2016-12-25 DIAGNOSIS — N2581 Secondary hyperparathyroidism of renal origin: Secondary | ICD-10-CM | POA: Diagnosis not present

## 2016-12-25 DIAGNOSIS — N186 End stage renal disease: Secondary | ICD-10-CM | POA: Diagnosis not present

## 2016-12-25 DIAGNOSIS — D631 Anemia in chronic kidney disease: Secondary | ICD-10-CM | POA: Diagnosis not present

## 2016-12-25 DIAGNOSIS — D509 Iron deficiency anemia, unspecified: Secondary | ICD-10-CM | POA: Diagnosis not present

## 2016-12-25 DIAGNOSIS — E119 Type 2 diabetes mellitus without complications: Secondary | ICD-10-CM | POA: Diagnosis not present

## 2016-12-27 DIAGNOSIS — E119 Type 2 diabetes mellitus without complications: Secondary | ICD-10-CM | POA: Diagnosis not present

## 2016-12-27 DIAGNOSIS — D509 Iron deficiency anemia, unspecified: Secondary | ICD-10-CM | POA: Diagnosis not present

## 2016-12-27 DIAGNOSIS — N2581 Secondary hyperparathyroidism of renal origin: Secondary | ICD-10-CM | POA: Diagnosis not present

## 2016-12-27 DIAGNOSIS — D631 Anemia in chronic kidney disease: Secondary | ICD-10-CM | POA: Diagnosis not present

## 2016-12-27 DIAGNOSIS — N186 End stage renal disease: Secondary | ICD-10-CM | POA: Diagnosis not present

## 2016-12-30 DIAGNOSIS — D631 Anemia in chronic kidney disease: Secondary | ICD-10-CM | POA: Diagnosis not present

## 2016-12-30 DIAGNOSIS — D509 Iron deficiency anemia, unspecified: Secondary | ICD-10-CM | POA: Diagnosis not present

## 2016-12-30 DIAGNOSIS — E119 Type 2 diabetes mellitus without complications: Secondary | ICD-10-CM | POA: Diagnosis not present

## 2016-12-30 DIAGNOSIS — N186 End stage renal disease: Secondary | ICD-10-CM | POA: Diagnosis not present

## 2016-12-30 DIAGNOSIS — N2581 Secondary hyperparathyroidism of renal origin: Secondary | ICD-10-CM | POA: Diagnosis not present

## 2017-01-01 DIAGNOSIS — E119 Type 2 diabetes mellitus without complications: Secondary | ICD-10-CM | POA: Diagnosis not present

## 2017-01-01 DIAGNOSIS — D631 Anemia in chronic kidney disease: Secondary | ICD-10-CM | POA: Diagnosis not present

## 2017-01-01 DIAGNOSIS — D509 Iron deficiency anemia, unspecified: Secondary | ICD-10-CM | POA: Diagnosis not present

## 2017-01-01 DIAGNOSIS — N2581 Secondary hyperparathyroidism of renal origin: Secondary | ICD-10-CM | POA: Diagnosis not present

## 2017-01-01 DIAGNOSIS — N186 End stage renal disease: Secondary | ICD-10-CM | POA: Diagnosis not present

## 2017-01-03 ENCOUNTER — Emergency Department: Payer: Medicare Other

## 2017-01-03 ENCOUNTER — Inpatient Hospital Stay
Admission: EM | Admit: 2017-01-03 | Discharge: 2017-01-08 | DRG: 673 | Disposition: A | Discharge status: Skilled Nursing Facility | Payer: Medicare Other | Attending: Internal Medicine | Admitting: Internal Medicine

## 2017-01-03 ENCOUNTER — Encounter: Payer: Self-pay | Admitting: Emergency Medicine

## 2017-01-03 ENCOUNTER — Other Ambulatory Visit: Payer: Self-pay

## 2017-01-03 DIAGNOSIS — N390 Urinary tract infection, site not specified: Secondary | ICD-10-CM

## 2017-01-03 DIAGNOSIS — Z882 Allergy status to sulfonamides status: Secondary | ICD-10-CM | POA: Diagnosis not present

## 2017-01-03 DIAGNOSIS — Z86010 Personal history of colonic polyps: Secondary | ICD-10-CM

## 2017-01-03 DIAGNOSIS — Y832 Surgical operation with anastomosis, bypass or graft as the cause of abnormal reaction of the patient, or of later complication, without mention of misadventure at the time of the procedure: Secondary | ICD-10-CM | POA: Diagnosis not present

## 2017-01-03 DIAGNOSIS — G9340 Encephalopathy, unspecified: Secondary | ICD-10-CM | POA: Diagnosis not present

## 2017-01-03 DIAGNOSIS — J810 Acute pulmonary edema: Secondary | ICD-10-CM | POA: Diagnosis not present

## 2017-01-03 DIAGNOSIS — E785 Hyperlipidemia, unspecified: Secondary | ICD-10-CM | POA: Diagnosis present

## 2017-01-03 DIAGNOSIS — I132 Hypertensive heart and chronic kidney disease with heart failure and with stage 5 chronic kidney disease, or end stage renal disease: Secondary | ICD-10-CM | POA: Diagnosis present

## 2017-01-03 DIAGNOSIS — I2510 Atherosclerotic heart disease of native coronary artery without angina pectoris: Secondary | ICD-10-CM | POA: Diagnosis not present

## 2017-01-03 DIAGNOSIS — I110 Hypertensive heart disease with heart failure: Secondary | ICD-10-CM | POA: Diagnosis not present

## 2017-01-03 DIAGNOSIS — F419 Anxiety disorder, unspecified: Secondary | ICD-10-CM | POA: Diagnosis present

## 2017-01-03 DIAGNOSIS — T82858A Stenosis of vascular prosthetic devices, implants and grafts, initial encounter: Secondary | ICD-10-CM | POA: Diagnosis not present

## 2017-01-03 DIAGNOSIS — Z888 Allergy status to other drugs, medicaments and biological substances status: Secondary | ICD-10-CM | POA: Diagnosis not present

## 2017-01-03 DIAGNOSIS — E1122 Type 2 diabetes mellitus with diabetic chronic kidney disease: Secondary | ICD-10-CM | POA: Diagnosis not present

## 2017-01-03 DIAGNOSIS — Z7901 Long term (current) use of anticoagulants: Secondary | ICD-10-CM

## 2017-01-03 DIAGNOSIS — Z89422 Acquired absence of other left toe(s): Secondary | ICD-10-CM

## 2017-01-03 DIAGNOSIS — I350 Nonrheumatic aortic (valve) stenosis: Secondary | ICD-10-CM | POA: Diagnosis not present

## 2017-01-03 DIAGNOSIS — I071 Rheumatic tricuspid insufficiency: Secondary | ICD-10-CM | POA: Diagnosis present

## 2017-01-03 DIAGNOSIS — R0902 Hypoxemia: Secondary | ICD-10-CM | POA: Diagnosis present

## 2017-01-03 DIAGNOSIS — Z992 Dependence on renal dialysis: Secondary | ICD-10-CM | POA: Diagnosis not present

## 2017-01-03 DIAGNOSIS — Z7982 Long term (current) use of aspirin: Secondary | ICD-10-CM

## 2017-01-03 DIAGNOSIS — N3000 Acute cystitis without hematuria: Secondary | ICD-10-CM | POA: Diagnosis not present

## 2017-01-03 DIAGNOSIS — G9341 Metabolic encephalopathy: Secondary | ICD-10-CM | POA: Diagnosis not present

## 2017-01-03 DIAGNOSIS — I509 Heart failure, unspecified: Secondary | ICD-10-CM | POA: Diagnosis not present

## 2017-01-03 DIAGNOSIS — Z66 Do not resuscitate: Secondary | ICD-10-CM | POA: Diagnosis present

## 2017-01-03 DIAGNOSIS — M8580 Other specified disorders of bone density and structure, unspecified site: Secondary | ICD-10-CM | POA: Diagnosis present

## 2017-01-03 DIAGNOSIS — N186 End stage renal disease: Secondary | ICD-10-CM | POA: Diagnosis not present

## 2017-01-03 DIAGNOSIS — Z96653 Presence of artificial knee joint, bilateral: Secondary | ICD-10-CM | POA: Diagnosis present

## 2017-01-03 DIAGNOSIS — Y92009 Unspecified place in unspecified non-institutional (private) residence as the place of occurrence of the external cause: Secondary | ICD-10-CM | POA: Diagnosis not present

## 2017-01-03 DIAGNOSIS — Z8614 Personal history of Methicillin resistant Staphylococcus aureus infection: Secondary | ICD-10-CM

## 2017-01-03 DIAGNOSIS — Z79899 Other long term (current) drug therapy: Secondary | ICD-10-CM

## 2017-01-03 DIAGNOSIS — Z87440 Personal history of urinary (tract) infections: Secondary | ICD-10-CM

## 2017-01-03 DIAGNOSIS — Z87891 Personal history of nicotine dependence: Secondary | ICD-10-CM

## 2017-01-03 DIAGNOSIS — I248 Other forms of acute ischemic heart disease: Secondary | ICD-10-CM | POA: Diagnosis present

## 2017-01-03 DIAGNOSIS — B9620 Unspecified Escherichia coli [E. coli] as the cause of diseases classified elsewhere: Secondary | ICD-10-CM | POA: Diagnosis present

## 2017-01-03 DIAGNOSIS — B029 Zoster without complications: Secondary | ICD-10-CM | POA: Diagnosis present

## 2017-01-03 DIAGNOSIS — R531 Weakness: Secondary | ICD-10-CM | POA: Diagnosis present

## 2017-01-03 DIAGNOSIS — D631 Anemia in chronic kidney disease: Secondary | ICD-10-CM | POA: Diagnosis not present

## 2017-01-03 DIAGNOSIS — I25118 Atherosclerotic heart disease of native coronary artery with other forms of angina pectoris: Secondary | ICD-10-CM | POA: Diagnosis not present

## 2017-01-03 DIAGNOSIS — I5032 Chronic diastolic (congestive) heart failure: Secondary | ICD-10-CM | POA: Diagnosis present

## 2017-01-03 DIAGNOSIS — Z951 Presence of aortocoronary bypass graft: Secondary | ICD-10-CM

## 2017-01-03 DIAGNOSIS — T82868A Thrombosis of vascular prosthetic devices, implants and grafts, initial encounter: Secondary | ICD-10-CM | POA: Diagnosis not present

## 2017-01-03 DIAGNOSIS — Z90710 Acquired absence of both cervix and uterus: Secondary | ICD-10-CM

## 2017-01-03 DIAGNOSIS — T375X5A Adverse effect of antiviral drugs, initial encounter: Secondary | ICD-10-CM | POA: Diagnosis present

## 2017-01-03 DIAGNOSIS — Z952 Presence of prosthetic heart valve: Secondary | ICD-10-CM

## 2017-01-03 DIAGNOSIS — R443 Hallucinations, unspecified: Secondary | ICD-10-CM | POA: Diagnosis present

## 2017-01-03 DIAGNOSIS — Z85828 Personal history of other malignant neoplasm of skin: Secondary | ICD-10-CM

## 2017-01-03 DIAGNOSIS — H9190 Unspecified hearing loss, unspecified ear: Secondary | ICD-10-CM | POA: Diagnosis present

## 2017-01-03 DIAGNOSIS — E039 Hypothyroidism, unspecified: Secondary | ICD-10-CM | POA: Diagnosis present

## 2017-01-03 DIAGNOSIS — Z7902 Long term (current) use of antithrombotics/antiplatelets: Secondary | ICD-10-CM

## 2017-01-03 DIAGNOSIS — I272 Pulmonary hypertension, unspecified: Secondary | ICD-10-CM | POA: Diagnosis present

## 2017-01-03 DIAGNOSIS — M1990 Unspecified osteoarthritis, unspecified site: Secondary | ICD-10-CM | POA: Diagnosis not present

## 2017-01-03 DIAGNOSIS — R410 Disorientation, unspecified: Secondary | ICD-10-CM | POA: Diagnosis not present

## 2017-01-03 DIAGNOSIS — E43 Unspecified severe protein-calorie malnutrition: Secondary | ICD-10-CM

## 2017-01-03 DIAGNOSIS — E1151 Type 2 diabetes mellitus with diabetic peripheral angiopathy without gangrene: Secondary | ICD-10-CM | POA: Diagnosis present

## 2017-01-03 DIAGNOSIS — I10 Essential (primary) hypertension: Secondary | ICD-10-CM | POA: Diagnosis not present

## 2017-01-03 DIAGNOSIS — N2581 Secondary hyperparathyroidism of renal origin: Secondary | ICD-10-CM | POA: Diagnosis not present

## 2017-01-03 DIAGNOSIS — Z79890 Hormone replacement therapy (postmenopausal): Secondary | ICD-10-CM

## 2017-01-03 DIAGNOSIS — K219 Gastro-esophageal reflux disease without esophagitis: Secondary | ICD-10-CM | POA: Diagnosis present

## 2017-01-03 DIAGNOSIS — I120 Hypertensive chronic kidney disease with stage 5 chronic kidney disease or end stage renal disease: Secondary | ICD-10-CM | POA: Diagnosis not present

## 2017-01-03 DIAGNOSIS — R0602 Shortness of breath: Secondary | ICD-10-CM | POA: Diagnosis not present

## 2017-01-03 LAB — URINALYSIS, COMPLETE (UACMP) WITH MICROSCOPIC
BILIRUBIN URINE: NEGATIVE
Glucose, UA: NEGATIVE mg/dL
KETONES UR: NEGATIVE mg/dL
LEUKOCYTES UA: NEGATIVE
Nitrite: NEGATIVE
PH: 7 (ref 5.0–8.0)
Protein, ur: 100 mg/dL — AB
SPECIFIC GRAVITY, URINE: 1.011 (ref 1.005–1.030)

## 2017-01-03 LAB — COMPREHENSIVE METABOLIC PANEL
ALT: 11 U/L — AB (ref 14–54)
ANION GAP: 16 — AB (ref 5–15)
AST: 25 U/L (ref 15–41)
Albumin: 3.9 g/dL (ref 3.5–5.0)
Alkaline Phosphatase: 138 U/L — ABNORMAL HIGH (ref 38–126)
BUN: 24 mg/dL — ABNORMAL HIGH (ref 6–20)
CHLORIDE: 96 mmol/L — AB (ref 101–111)
CO2: 25 mmol/L (ref 22–32)
CREATININE: 3.94 mg/dL — AB (ref 0.44–1.00)
Calcium: 9.3 mg/dL (ref 8.9–10.3)
GFR, EST AFRICAN AMERICAN: 10 mL/min — AB (ref >60)
GFR, EST NON AFRICAN AMERICAN: 9 mL/min — AB (ref >60)
Glucose, Bld: 110 mg/dL — ABNORMAL HIGH (ref 65–99)
Potassium: 4.2 mmol/L (ref 3.5–5.1)
SODIUM: 137 mmol/L (ref 135–145)
Total Bilirubin: 1.8 mg/dL — ABNORMAL HIGH (ref 0.3–1.2)
Total Protein: 7.8 g/dL (ref 6.5–8.1)

## 2017-01-03 LAB — CBC
HEMATOCRIT: 39 % (ref 35.0–47.0)
HEMOGLOBIN: 12.7 g/dL (ref 12.0–16.0)
MCH: 32 pg (ref 26.0–34.0)
MCHC: 32.6 g/dL (ref 32.0–36.0)
MCV: 98 fL (ref 80.0–100.0)
Platelets: 155 10*3/uL (ref 150–440)
RBC: 3.98 MIL/uL (ref 3.80–5.20)
RDW: 19.5 % — ABNORMAL HIGH (ref 11.5–14.5)
WBC: 8.5 10*3/uL (ref 3.6–11.0)

## 2017-01-03 LAB — TROPONIN I
TROPONIN I: 0.04 ng/mL — AB (ref <0.03)
TROPONIN I: 0.04 ng/mL — AB (ref <0.03)
Troponin I: 0.06 ng/mL (ref <0.03)

## 2017-01-03 LAB — INFLUENZA PANEL BY PCR (TYPE A & B)
Influenza A By PCR: NEGATIVE
Influenza B By PCR: NEGATIVE

## 2017-01-03 LAB — CREATININE, SERUM
CREATININE: 4.33 mg/dL — AB (ref 0.44–1.00)
GFR calc Af Amer: 9 mL/min — ABNORMAL LOW (ref >60)
GFR calc non Af Amer: 8 mL/min — ABNORMAL LOW (ref >60)

## 2017-01-03 LAB — CBC WITH DIFFERENTIAL/PLATELET
BASOS ABS: 0.1 10*3/uL (ref 0–0.1)
Basophils Relative: 1 %
EOS ABS: 0.1 10*3/uL (ref 0–0.7)
EOS PCT: 1 %
HCT: 40.6 % (ref 35.0–47.0)
HEMOGLOBIN: 13.2 g/dL (ref 12.0–16.0)
LYMPHS ABS: 0.4 10*3/uL — AB (ref 1.0–3.6)
LYMPHS PCT: 5 %
MCH: 31.7 pg (ref 26.0–34.0)
MCHC: 32.5 g/dL (ref 32.0–36.0)
MCV: 97.7 fL (ref 80.0–100.0)
Monocytes Absolute: 0.8 10*3/uL (ref 0.2–0.9)
Monocytes Relative: 11 %
NEUTROS PCT: 82 %
Neutro Abs: 6.4 10*3/uL (ref 1.4–6.5)
PLATELETS: 144 10*3/uL — AB (ref 150–440)
RBC: 4.15 MIL/uL (ref 3.80–5.20)
RDW: 19.7 % — ABNORMAL HIGH (ref 11.5–14.5)
WBC: 7.7 10*3/uL (ref 3.6–11.0)

## 2017-01-03 LAB — BLOOD GAS, VENOUS
ACID-BASE EXCESS: 5.2 mmol/L — AB (ref 0.0–2.0)
BICARBONATE: 29.9 mmol/L — AB (ref 20.0–28.0)
O2 SAT: 82.1 %
PATIENT TEMPERATURE: 37
PO2 VEN: 44 mmHg (ref 32.0–45.0)
pCO2, Ven: 43 mmHg — ABNORMAL LOW (ref 44.0–60.0)
pH, Ven: 7.45 — ABNORMAL HIGH (ref 7.250–7.430)

## 2017-01-03 LAB — TSH: TSH: 3.873 u[IU]/mL (ref 0.350–4.500)

## 2017-01-03 LAB — LACTIC ACID, PLASMA: LACTIC ACID, VENOUS: 1.7 mmol/L (ref 0.5–1.9)

## 2017-01-03 LAB — MRSA PCR SCREENING: MRSA by PCR: NEGATIVE

## 2017-01-03 LAB — PHOSPHORUS: PHOSPHORUS: 2.2 mg/dL — AB (ref 2.5–4.6)

## 2017-01-03 MED ORDER — DOCUSATE SODIUM 100 MG PO CAPS
100.0000 mg | ORAL_CAPSULE | Freq: Two times a day (BID) | ORAL | Status: DC
  Administered 2017-01-03 – 2017-01-07 (×7): 100 mg via ORAL
  Filled 2017-01-03 (×8): qty 1

## 2017-01-03 MED ORDER — AMLODIPINE BESYLATE 5 MG PO TABS
5.0000 mg | ORAL_TABLET | Freq: Every day | ORAL | Status: DC
  Administered 2017-01-04 – 2017-01-08 (×4): 5 mg via ORAL
  Filled 2017-01-03 (×5): qty 1

## 2017-01-03 MED ORDER — FOLIC ACID 1 MG PO TABS
0.5000 mg | ORAL_TABLET | Freq: Every day | ORAL | Status: DC
  Administered 2017-01-04 – 2017-01-08 (×4): 0.5 mg via ORAL
  Filled 2017-01-03 (×4): qty 1

## 2017-01-03 MED ORDER — HYDRALAZINE HCL 25 MG PO TABS
25.0000 mg | ORAL_TABLET | Freq: Three times a day (TID) | ORAL | Status: DC
  Administered 2017-01-03 – 2017-01-08 (×11): 25 mg via ORAL
  Filled 2017-01-03 (×13): qty 1

## 2017-01-03 MED ORDER — PRAVASTATIN SODIUM 20 MG PO TABS
40.0000 mg | ORAL_TABLET | Freq: Every evening | ORAL | Status: DC
  Administered 2017-01-04 – 2017-01-07 (×4): 40 mg via ORAL
  Filled 2017-01-03 (×4): qty 2

## 2017-01-03 MED ORDER — POLYVINYL ALCOHOL 1.4 % OP SOLN: 1.0000 [drp] | OPHTHALMIC | Status: DC | PRN

## 2017-01-03 MED ORDER — ONDANSETRON HCL 4 MG/2ML IJ SOLN: 4.0000 mg | Freq: Four times a day (QID) | INTRAMUSCULAR | Status: DC | PRN

## 2017-01-03 MED ORDER — HYDRALAZINE HCL 20 MG/ML IJ SOLN
10.0000 mg | INTRAMUSCULAR | Status: DC | PRN
  Administered 2017-01-03 – 2017-01-07 (×2): 10 mg via INTRAVENOUS
  Filled 2017-01-03 (×2): qty 1

## 2017-01-03 MED ORDER — LEVOTHYROXINE SODIUM 75 MCG PO TABS
75.0000 ug | ORAL_TABLET | Freq: Every day | ORAL | Status: DC
  Administered 2017-01-04 – 2017-01-08 (×4): 75 ug via ORAL
  Filled 2017-01-03 (×5): qty 1

## 2017-01-03 MED ORDER — IPRATROPIUM-ALBUTEROL 0.5-2.5 (3) MG/3ML IN SOLN
3.0000 mL | Freq: Four times a day (QID) | RESPIRATORY_TRACT | Status: DC
  Administered 2017-01-03 – 2017-01-05 (×6): 3 mL via RESPIRATORY_TRACT
  Filled 2017-01-03 (×7): qty 3

## 2017-01-03 MED ORDER — BISACODYL 10 MG RE SUPP: 10.0000 mg | Freq: Every day | RECTAL | Status: DC | PRN

## 2017-01-03 MED ORDER — VALACYCLOVIR HCL 500 MG PO TABS
500.0000 mg | ORAL_TABLET | Freq: Every day | ORAL | Status: DC
  Administered 2017-01-03: 500 mg via ORAL
  Filled 2017-01-03: qty 1

## 2017-01-03 MED ORDER — CLOPIDOGREL BISULFATE 75 MG PO TABS
75.0000 mg | ORAL_TABLET | Freq: Every day | ORAL | Status: DC
  Administered 2017-01-04 – 2017-01-08 (×5): 75 mg via ORAL
  Filled 2017-01-03 (×5): qty 1

## 2017-01-03 MED ORDER — FUROSEMIDE 10 MG/ML IJ SOLN
80.0000 mg | Freq: Once | INTRAMUSCULAR | Status: AC
  Administered 2017-01-03: 80 mg via INTRAVENOUS
  Filled 2017-01-03: qty 8

## 2017-01-03 MED ORDER — DEXTROSE 5 % IV SOLN
500.0000 mg | INTRAVENOUS | Status: DC
  Filled 2017-01-03: qty 0.5

## 2017-01-03 MED ORDER — LABETALOL HCL 5 MG/ML IV SOLN: 5.0000 mg | INTRAVENOUS | Status: DC | PRN

## 2017-01-03 MED ORDER — SODIUM CHLORIDE 0.9 % IV SOLN
INTRAVENOUS | Status: DC
Start: 2017-01-03 — End: 2017-01-03
  Administered 2017-01-03: 12:00:00 via INTRAVENOUS

## 2017-01-03 MED ORDER — HYDRALAZINE HCL 20 MG/ML IJ SOLN: 5.0000 mg | Freq: Once | INTRAMUSCULAR | Status: DC

## 2017-01-03 MED ORDER — ONDANSETRON HCL 4 MG PO TABS: 4.0000 mg | ORAL_TABLET | Freq: Four times a day (QID) | ORAL | Status: DC | PRN

## 2017-01-03 MED ORDER — FUROSEMIDE 10 MG/ML IJ SOLN
80.0000 mg | Freq: Two times a day (BID) | INTRAMUSCULAR | Status: DC
  Administered 2017-01-03: 80 mg via INTRAVENOUS
  Filled 2017-01-03 (×2): qty 8

## 2017-01-03 MED ORDER — DEXTROSE 5 % IV SOLN
2.0000 g | Freq: Once | INTRAVENOUS | Status: AC
  Administered 2017-01-03: 2 g via INTRAVENOUS
  Filled 2017-01-03: qty 2

## 2017-01-03 MED ORDER — METOPROLOL TARTRATE 50 MG PO TABS
50.0000 mg | ORAL_TABLET | Freq: Two times a day (BID) | ORAL | Status: DC
  Administered 2017-01-03 – 2017-01-07 (×7): 50 mg via ORAL
  Filled 2017-01-03 (×7): qty 1

## 2017-01-03 MED ORDER — HEPARIN SODIUM (PORCINE) 5000 UNIT/ML IJ SOLN
5000.0000 [IU] | Freq: Three times a day (TID) | INTRAMUSCULAR | Status: DC
  Administered 2017-01-03 – 2017-01-08 (×12): 5000 [IU] via SUBCUTANEOUS
  Filled 2017-01-03 (×12): qty 1

## 2017-01-03 MED ORDER — ACETAMINOPHEN 650 MG RE SUPP: 650.0000 mg | Freq: Four times a day (QID) | RECTAL | Status: DC | PRN

## 2017-01-03 MED ORDER — PANTOPRAZOLE SODIUM 40 MG IV SOLR
40.0000 mg | Freq: Two times a day (BID) | INTRAVENOUS | Status: DC
  Administered 2017-01-03 – 2017-01-07 (×10): 40 mg via INTRAVENOUS
  Filled 2017-01-03 (×10): qty 40

## 2017-01-03 MED ORDER — CLONIDINE HCL 0.1 MG PO TABS
0.1000 mg | ORAL_TABLET | Freq: Two times a day (BID) | ORAL | Status: DC
  Administered 2017-01-03 – 2017-01-07 (×6): 0.1 mg via ORAL
  Filled 2017-01-03 (×6): qty 1

## 2017-01-03 MED ORDER — CALCIUM CARBONATE ANTACID 500 MG PO CHEW
1.0000 | CHEWABLE_TABLET | Freq: Three times a day (TID) | ORAL | Status: DC
  Administered 2017-01-04 – 2017-01-08 (×10): 200 mg via ORAL
  Filled 2017-01-03 (×10): qty 1

## 2017-01-03 MED ORDER — NITROGLYCERIN 2 % TD OINT
1.0000 [in_us] | TOPICAL_OINTMENT | Freq: Four times a day (QID) | TRANSDERMAL | Status: DC
Start: 2017-01-03 — End: 2017-01-08
  Administered 2017-01-03 – 2017-01-08 (×16): 1 [in_us] via TOPICAL
  Filled 2017-01-03 (×16): qty 1

## 2017-01-03 MED ORDER — ACETAMINOPHEN 325 MG PO TABS: 650.0000 mg | ORAL_TABLET | Freq: Four times a day (QID) | ORAL | Status: DC | PRN

## 2017-01-03 MED ORDER — VITAMIN C 500 MG PO TABS
500.0000 mg | ORAL_TABLET | Freq: Every day | ORAL | Status: DC
  Administered 2017-01-05: 500 mg via ORAL
  Filled 2017-01-03 (×4): qty 1

## 2017-01-03 MED ORDER — ASPIRIN EC 81 MG PO TBEC
81.0000 mg | DELAYED_RELEASE_TABLET | Freq: Every day | ORAL | Status: DC
  Administered 2017-01-03 – 2017-01-07 (×5): 81 mg via ORAL
  Filled 2017-01-03 (×5): qty 1

## 2017-01-03 NOTE — ED Provider Notes (Signed)
[facility] Emergency Department Provider Note    None    (approximate)  I have reviewed the triage vital signs and the nursing notes.   HISTORY  Chief Complaint Confusion  Level V Caveat:  Metabolic encephalopathy  HPI [name] is a 83 y.o. female both will chronic medical illnesses including end-stage renal disease on dialysis with recent treatment of shingles with Valtrex presents to the ER for confusion reported hallucinations and foul-smelling urine.  Apparently dialysis center noted on Friday that she was more confused.  And has had steady deterioration over the past 48 hours.  Patient supposed to have dialysis today.  Patient is noncommunicative at this time does appear altered.  Mild respiratory distress respirations in the high 20s to low 30s.  Reported elevation of blood pressure.  Afebrile in route.  No other recent antibiotics.  Past Medical History:  Diagnosis Date  . Anemia    NOS  . Anxiety   . Aortic stenosis   . Cancer [facility])    Mole on top of head to be removed in July 2013  . CHF (congestive heart failure) ([place])   . Constipation   . Coronary artery disease   . Diabetes mellitus    type II;was on Glipizide but has been off x 32mon  . Dialysis patient ([place])   . Diarrhea   . GERD (gastroesophageal reflux disease)   . History of blood transfusion   . Hyperlipidemia    takes Simvastatin nightly  . Hyperlipidemia   . Hypertension    takes Metoprolol daily  . Hypothyroidism    takes Synthroid daily  . Migraine    hx of  . Oligouria   . Osteoarthritis   . Osteopenia   . Peripheral vascular disease ([place])   . Personal history of colonic polyps   . Pulmonary hypertension ([place])   . Renal insufficiency   . Urinary incontinence   . UTI (urinary tract infection)    Family History  Family history unknown: Yes   Past Surgical History:  Procedure Laterality Date  . ABDOMINAL HYSTERECTOMY    . adenosine myoview  2007   benign,  EF 69%  . AMPUTATION  06/18/2011   Procedure: AMPUTATION DIGIT;  Surgeon: Newt Minion, MD;  Location: [place];  Service: Orthopedics;  Laterality: Left;  Left 2nd Toe Amputation at MTP Joint  . Amputation-left great toe  7/12   Dr Marlou Sa  . AORTIC VALVE REPLACEMENT  2009  . APPENDECTOMY    . AV FISTULA PLACEMENT  12/22/2010   Procedure: ARTERIOVENOUS (AV) FISTULA CREATION;  Surgeon: Hinda Lenis, MD;  Location: [place];  Service: Vascular;  Laterality: Left;  Creation of Left Brachial-Cephalic Fistula  . CARDIAC CATHETERIZATION  2000   cad  . CAROTID ENDARTERECTOMY  2011   Right  . CATARACT EXTRACTION    . CORONARY ARTERY BYPASS GRAFT     od  . CORONARY ARTERY BYPASS GRAFT  2009  . EYE SURGERY     BIlateral  . INSERTION OF DIALYSIS CATHETER  12/18/2010   Procedure: INSERTION OF DIALYSIS CATHETER;  Surgeon: Mal Misty, MD;  Location: [place];  Service: Vascular;  Laterality: Right;  . REPLACEMENT TOTAL KNEE BILATERAL  05/1998  . TONSILLECTOMY    . Traumatic Amputation of Right DIP joint of Index Finger     Patient Active Problem List   Diagnosis Date Noted  . Cerebrovascular accident (CVA) due to occlusion of right middle cerebral artery (  [place]) 10/24/2015  . Malnutrition of mild degree ([place]) 10/24/2015  . Altered mental status 09/18/2015  . Preventative health care 05/03/2014  . Peripheral vascular disease ([place]) 12/21/2013  . Toe amputation status ([place]) 08/29/2013  . History of recent fall 04/18/2013  . HTN (hypertension), malignant 03/22/2013  . S/P aortic valve replacement with bioprosthetic valve 11/15/2012  . S/P CABG x 3 11/15/2012  . Anemia of chronic kidney failure 12/22/2010  . Thrombocytopenia ([place]) 12/13/2010  . End stage renal disease on dialysis ([place]) 12/13/2010  . S/P aortic valve replacement 08/11/2010  . Arterial insufficiency-lower 07/25/2010  . NEURODERMATITIS 04/08/2009  . Carotid arterial disease ([place]) 01/01/2009  . Coronary atherosclerosis 02/11/2007    . AORTIC STENOSIS 10/04/2006  . ANXIETY 05/30/2006  . Pulmonary hypertension ([place]) 05/30/2006  . Hypothyroidism 05/27/2006  . Type 2 diabetes mellitus with renal manifestations, controlled ([place]) 05/27/2006  . Hyperlipidemia 05/27/2006  . Essential hypertension 05/27/2006  . GERD 05/27/2006  . OSTEOARTHRITIS 05/27/2006  . OSTEOPENIA 05/27/2006  . URINARY INCONTINENCE 05/27/2006      Prior to Admission medications   Medication Sig Start Date End Date Taking? Authorizing Provider  amLODipine (NORVASC) 5 MG tablet Take 1 tablet (5 mg total) by mouth daily. 11/12/15   [name], MD  aspirin EC 81 MG tablet Take 81 mg by mouth at bedtime.    [provider]  calcium carbonate (TUMS - DOSED IN MG ELEMENTAL CALCIUM) 500 MG chewable tablet Chew 1 tablet (200 mg of elemental calcium total) by mouth 3 (three) times daily with meals. 12/06/12   Geradine Girt, DO  cloNIDine (CATAPRES) 0.1 MG tablet Take 1 tablet (0.1 mg total) by mouth 2 (two) times daily. 02/13/16   [name], MD  clopidogrel (PLAVIX) 75 MG tablet Take 1 tablet (75 mg total) by mouth daily. 01/20/16   [name], MD  folic acid (FOLVITE) 0.5 MG tablet Take 0.5 tablets (0.5 mg total) by mouth daily. 12/06/12   Geradine Girt, DO  hydrALAZINE (APRESOLINE) 25 MG tablet TAKE ONE TABLET BY MOUTH EVERY 8 HOURS. 04/01/16   [name], MD  levothyroxine (SYNTHROID, LEVOTHROID) 75 MCG tablet TAKE ONE TABLET BY MOUTH ONCE DAILY 07/07/16   [name], MD  metoprolol (LOPRESSOR) 50 MG tablet Take 1 tablet (50 mg total) by mouth 2 (two) times daily. 11/25/15   Minna Merritts, MD  polyvinyl alcohol (LIQUIFILM TEARS) 1.4 % ophthalmic solution Place 1 drop into both eyes as needed for dry eyes. 09/22/15   Fritzi Mandes, MD  pravastatin (PRAVACHOL) 40 MG tablet TAKE ONE TABLET BY MOUTH IN THE EVENING 07/21/16   Minna Merritts, MD  vitamin C (VITAMIN C) 500 MG tablet Take 1 tablet (500 mg total) by mouth  daily. 12/06/12   Geradine Girt, DO    Allergies Nitrofurantoin; Captopril; Enalapril maleate; Ramipril; Sulfa antibiotics; and Verapamil    Social History Social History   Tobacco Use  . Smoking status: Former Smoker    Years: 15.00    Types: Cigarettes    Last attempt to quit: 01/06/1943    Years since quitting: 74.0  . Smokeless tobacco: Never Used  Substance Use Topics  . Alcohol use: Yes    Comment: Wine occasionally  . Drug use: No    Review of Systems Patient denies headaches, rhinorrhea, blurry vision, numbness, shortness of breath, chest pain, edema, cough, abdominal pain, nausea, vomiting, diarrhea, dysuria, fevers, rashes or hallucinations unless otherwise stated above in  HPI. ____________________________________________   PHYSICAL EXAM:  VITAL SIGNS: There were no vitals filed for this visit.  Constitutional: Alert ill appearing, in moderate resp distress. Eyes: Conjunctivae are normal.  Head: Atraumatic. Nose: No congestion/rhinnorhea. Mouth/Throat: Mucous membranes are moist.   Neck: No stridor. Painless ROM.  Cardiovascular: Normal rate, regular rhythm. Grossly normal heart sounds.  Good peripheral circulation. Respiratory: tachypnea, diminished breath sounds r>L, bibasilar crackles Gastrointestinal: Soft and nontender. No distention. No abdominal bruits. No CVA tenderness. Genitourinary:  Musculoskeletal: No lower extremity tenderness , + edema.  No joint effusions. Neurologic:  Normal speech and language. No gross focal neurologic deficits are appreciated. No facial droop Skin:  Skin is warm, dry and intact. No rash noted. Psychiatric: Mood and affect are normal. Speech and behavior are normal.  ____________________________________________   LABS (all labs ordered are listed, but only abnormal results are displayed)  No results found for this or any previous visit (from the past 24 hour(s)). ____________________________________________  EKG My  review and personal interpretation at Time: 8:36   Indication: ams  Rate: 95  Rhythm: sinus Axis: right Other: poor r wave progression, no stemi, normal intervals ____________________________________________  RADIOLOGY  I personally reviewed all radiographic images ordered to evaluate for the above acute complaints and reviewed radiology reports and findings.  These findings were personally discussed with the patient.  Please see medical record for radiology report.  ____________________________________________   PROCEDURES  Procedure(s) performed:  .Critical Care Performed by: Merlyn Lot, MD Authorized by: Merlyn Lot, MD   Critical care provider statement:    Critical care time (minutes):  35   Critical care time was exclusive of:  Separately billable procedures and treating other patients   Critical care was necessary to treat or prevent imminent or life-threatening deterioration of the following conditions:  Respiratory failure   Critical care was time spent personally by me on the following activities:  Development of treatment plan with patient or surrogate, discussions with consultants, evaluation of patient's response to treatment, examination of patient, obtaining history from patient or surrogate, ordering and performing treatments and interventions, ordering and review of laboratory studies, ordering and review of radiographic studies, pulse oximetry, re-evaluation of patient's condition and review of old charts      Critical Care performed: yes ____________________________________________   INITIAL IMPRESSION / [place] / ED COURSE  Pertinent labs & imaging results that were available during my care of the patient were reviewed by me and considered in my medical decision making (see chart for details).  DDX: Dehydration, sepsis, pna, uti, hypoglycemia, cva, drug effect, withdrawal, encephalitis   TAUHEEDAH BOK is a 83 y.o. who presents to  the ED with report of altered mental status.  She arrives afebrile without tachycardia but does seem to have some moderate respiratory distress with low O2 sats and is hypertensive.  Certainly concerning for component of congestive heart failure versus pneumonia.  Patient also smells strongly of urinary tract infection.  Blood work will be sent for concern for sepsis.  No evidence of ACS on EKG.  CT head ordered to evaluate for evidence of subdural or evidence of acute stroke versus hemorrhage shows no acute abnormalities.  Chest x-ray does show evidence of right sided effusion and cardiomegaly concerning for persistent chronic congestive heart failure.  Would also explain the patient's shortness of breath.  Clinical Course as of Jan 03 1029  Sun Jan 03, 2017  1014 Patient with evidence of UTI which would likely explain the patient's  confusion.  Will cover with cefepime as there is also concern for possible pneumonia.  Have lower suspicion for MRSA.  Patient satting well on supplemental oxygen.  Based on her acute encephalopathy with UTI on dialysis patient will require medical admission for further management.  [PR]    Clinical Course User Index [PR] Merlyn Lot, MD     ____________________________________________   FINAL CLINICAL IMPRESSION(S) / ED DIAGNOSES  Final diagnoses:  Acute on chronic congestive heart failure, unspecified heart failure type ([place])  Acute metabolic encephalopathy  Acute cystitis without hematuria      NEW MEDICATIONS STARTED DURING THIS VISIT:  This SmartLink is deprecated. Use AVSMEDLIST instead to display the medication list for a patient.   Note:  This document was prepared using Dragon voice recognition software and may include unintentional dictation errors.    Merlyn Lot, MD 01/03/17 [phone]

## 2017-01-03 NOTE — Progress Notes (Signed)
Post HD.  Pt had prolonged bleeding.  Tool approx 30 minutes to stop bleeding.  Report given, to monitor access for bleeding and remove the guaze in approx 6 hours.  Pt stable.  Access has achieved hemostasis.

## 2017-01-03 NOTE — Progress Notes (Signed)
Pre HD  

## 2017-01-03 NOTE — ED Notes (Signed)
Pt son at bedside. Pt was scheduled for dialysis today but was unable to go due to symptoms.

## 2017-01-03 NOTE — Clinical Social Work Note (Signed)
Clinical Social Work Assessment  Patient Details  Name: [name] MRN: [id number] Date of Birth: Aug 01, 1920  Date of referral:  01/03/17               Reason for consult:  Facility Placement                Permission sought to share information with:  Family Supports, Customer service manager Permission granted to share information::  Yes, Verbal Permission Granted  Name::     [name] [id number]  Son [place]::  All facilities  Relationship::     Contact Information:     Housing/Transportation Living arrangements for the past 2 months:  [place] of Information:  Power of [place], Adult Children Patient Interpreter Needed:  None Criminal Activity/Legal Involvement Pertinent to Current Situation/Hospitalization:  No - Comment as needed Significant Relationships:  Adult Children, Anna, Other Family Members Lives with:  Self Do you feel safe going back to the place where you live?  Yes Need for family participation in patient care:  Yes (Comment)  Care giving concerns: Son reports Mom is very active- Drives and lives on her own and uses a walker. 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 female has a past medical history significant for CAD, CHF, HTN, and ESRD on HD who normally is independent and lives alone now with 2-3 day hx of worsening SOB and confusion with hallucinations. Last HD was Friday. In ER, pt was hypertensive and tachycardic as well as hypoxic. Labs show UTI and CXR shows CHF. LCSW introduced myself to patient and her son, patient was receiving breathing treatment and was unable to speak. Met with her son Sylvan [facility] [phone]. Patient has hemo-dialysis 3x week and goes to heart track 2x week. She is independent with all her ADL's and uses a walker, dresses and bathes herself, continent, diabetic,and resided in her own home. Son agrees she would benefit from STR at SNF to rebuild her  strength. Patient has excellent family support and son is very involved in her care and outings. Insurance [employer]/medicare  Employment status:  Retired Forensic scientist:  [place]) PT Recommendations:   Awaiting for consult Information / Referral to community resources:   SNF  Patient/Family's Response to care:  Wants his mom to be comfortable at all times  Patient/Family's Understanding of and Emotional Response to Diagnosis, Current Treatment, and Prognosis: Good understanding of current health issues  Emotional Assessment Appearance:  Appears stated age, Well-Groomed Attitude/Demeanor/Rapport:    Affect (typically observed):  Unable to Assess Orientation:  Oriented to Self, Oriented to Place, Oriented to  Time Alcohol / Substance use:  Not Applicable Psych involvement (Current and /or in the community):  No (Comment)  Discharge Needs  Concerns to be addressed:  No discharge needs identified Readmission within the last 30 days:  No Current discharge risk:  None Barriers to Discharge:  No Barriers Identified   Joana Reamer, LCSW 01/03/2017, 12:36 PM

## 2017-01-03 NOTE — Consult Note (Signed)
Cardiology Consultation:   Patient ID: [name]; [id number]; September 29, 1920   Admit date: 01/03/2017 Date of Consult: 01/03/2017  Primary Care Provider: [name], MD Primary Cardiologist: [name] physician requesting consult: [name] Reason for consult: pulmonary edema in the setting of end-stage renal disease, known coronary artery disease/shortness of breath   Patient Profile:   [name] is a 83 y.o. female with a hx of  CAD,  severe aortic valve stenosis bypass surgery x3 vessel in 2009 with bioprosthetic aortic valve  diet controlled diabetes,  previous smoking,  moderate bilateral carotid disease,  end-stage renal disease on hemodialysis 3 days per week Urinary tract infections, history of incontinence Who presents with confusion for the past several days, worsening shortness of breath for the past several weeks  History of Present Illness:   Son at the bedside reports worsening shortness of breath for the past 2 weeks Periodically eating out, hibachi grill, likes the soup Increasing confusion with hallucinations past several days Reports some shakiness Recently diagnosed with shingles [name] eye, started on Valtrex.  Recommended she [name] 2-week course  Denies missing any hemodialysis sessions With noticeably more short of breath when she exerts herself Son does report she has frequent urinary tract infections  This notes indicating she was more confused at dialysis center last Friday Deterioration over the past 2 days That was supposed to have dialysis today, but was noncommunicative, altered Respirations in the high 20s-30s  Other past medical history reviewed September 2017 she went to dialysis, did not feel well. Sent to the emergency room Diagnosed with UTI,  in hospital 5 days, 9/13th Abn EKG on arrival, troponin normal (checked one time) She spent some time in Rehab 20 days  History of incontinence Total cholesterol 158, hemoglobin A1c 5.9   hemodialysis on March 22 2013 and had syncope. She was taken to the hospital and diagnosed with urinary tract infection with encephalopathy. She was [name] antibiotics with improvement of her symptoms. Troponin 0.37 and 0.41. This was felt to be secondary to demand ischemia.  Remote history of MRSA and C. Difficile in December 2014 Echocardiogram in late 2014, well-seated valve  Past Medical History:  Diagnosis Date  . Anemia    NOS  . Anxiety   . Aortic stenosis   . Cancer [facility])    Mole on top of head to be removed in July 2013  . CHF (congestive heart failure) ([place])   . Constipation   . Coronary artery disease   . Diabetes mellitus    type II;was on Glipizide but has been off x 67mon  . Dialysis patient ([place])   . Diarrhea   . GERD (gastroesophageal reflux disease)   . History of blood transfusion   . Hyperlipidemia    takes Simvastatin nightly  . Hyperlipidemia   . Hypertension    takes Metoprolol daily  . Hypothyroidism    takes Synthroid daily  . Migraine    hx of  . Oligouria   . Osteoarthritis   . Osteopenia   . Peripheral vascular disease ([place])   . Personal history of colonic polyps   . Pulmonary hypertension ([place])   . Renal insufficiency   . Urinary incontinence   . UTI (urinary tract infection)     Past Surgical History:  Procedure Laterality Date  . ABDOMINAL HYSTERECTOMY    . adenosine myoview  2007   benign, EF 69%  . AMPUTATION  06/18/2011   Procedure: AMPUTATION DIGIT;  Surgeon: [name]  Sharol Given, MD;  Location: [facility];  Service: Orthopedics;  Laterality: Left;  Left 2nd Toe Amputation at MTP Joint  . Amputation-left great toe  7/12   Dr [name]  . AORTIC VALVE REPLACEMENT  2009  . APPENDECTOMY    . AV FISTULA PLACEMENT  12/22/2010   Procedure: ARTERIOVENOUS (AV) FISTULA CREATION;  Surgeon: [name] Lenis, MD;  Location: [facility];  Service: Vascular;  Laterality: Left;  Creation of Left Brachial-Cephalic Fistula  . CARDIAC CATHETERIZATION  2000   cad    . CAROTID ENDARTERECTOMY  2011   [name]  . CATARACT EXTRACTION    . CORONARY ARTERY BYPASS GRAFT     od  . CORONARY ARTERY BYPASS GRAFT  2009  . EYE SURGERY     BIlateral  . INSERTION OF DIALYSIS CATHETER  12/18/2010   Procedure: INSERTION OF DIALYSIS CATHETER;  Surgeon: [name] Misty, MD;  Location: [facility];  Service: Vascular;  Laterality: [name];  . REPLACEMENT TOTAL KNEE BILATERAL  05/1998  . TONSILLECTOMY    . Traumatic Amputation of [name] DIP joint of Index Finger       Home Medications:  Prior to Admission medications   Medication Sig Start Date End Date Taking? Authorizing Provider  amLODipine (NORVASC) 5 MG tablet Take 1 tablet (5 mg total) by mouth daily. 11/12/15  Yes [name] I, MD  aspirin EC 81 MG tablet Take 81 mg by mouth at bedtime.   Yes [provider]  calcium carbonate (TUMS - DOSED IN MG ELEMENTAL CALCIUM) 500 MG chewable tablet Chew 1 tablet (200 mg of elemental calcium total) by mouth 3 (three) times daily with meals. 12/06/12  Yes [name]  cloNIDine (CATAPRES) 0.1 MG tablet Take 1 tablet (0.1 mg total) by mouth 2 (two) times daily. 02/13/16  Yes [name] Carbon, MD  clopidogrel (PLAVIX) 75 MG tablet Take 1 tablet (75 mg total) by mouth daily. 01/20/16  Yes [name] Carbon, MD  folic acid (FOLVITE) 0.5 MG tablet Take 0.5 tablets (0.5 mg total) by mouth daily. 12/06/12  Yes [name]  hydrALAZINE (APRESOLINE) 25 MG tablet TAKE ONE TABLET BY MOUTH EVERY 8 HOURS. 04/01/16  Yes [name] Carbon, MD  levothyroxine (SYNTHROID, LEVOTHROID) 75 MCG tablet TAKE ONE TABLET BY MOUTH ONCE DAILY 07/07/16  Yes [name] Carbon, MD  metoprolol (LOPRESSOR) 50 MG tablet Take 1 tablet (50 mg total) by mouth 2 (two) times daily. 11/25/15  Yes [name], [name] November, MD  polyvinyl alcohol (LIQUIFILM TEARS) 1.4 % ophthalmic solution Place 1 drop into both eyes as needed for dry eyes. 09/22/15  Yes [name] Mandes, MD  pravastatin (PRAVACHOL) 40 MG tablet TAKE ONE  TABLET BY MOUTH IN THE EVENING 07/21/16  Yes [name], [name] November, MD  valACYclovir (VALTREX) 500 MG tablet Take 500 mg by mouth daily. 12/31/16 01/14/17 Yes [provider]  vitamin C (VITAMIN C) 500 MG tablet Take 1 tablet (500 mg total) by mouth daily. 12/06/12  Yes Geradine Girt, [name]    Inpatient Medications: Scheduled Meds: . amLODipine  5 mg Oral Daily  . aspirin EC  81 mg Oral QHS  . calcium carbonate  1 tablet Oral TID WC  . [START ON 01/04/2017] ceFEPime (MAXIPIME) IV  500 mg Intravenous Q24H  . cloNIDine  0.1 mg Oral BID  . clopidogrel  75 mg Oral Daily  . docusate sodium  100 mg Oral BID  . folic acid  0.5 mg Oral Daily  .  furosemide  80 mg Intravenous Q12H  . heparin  5,000 Units Subcutaneous Q8H  . hydrALAZINE  25 mg Oral Q8H  . ipratropium-albuterol  3 mL Nebulization QID  . [START ON 01/04/2017] levothyroxine  75 mcg Oral Daily  . metoprolol tartrate  50 mg Oral BID  . nitroGLYCERIN  1 inch Topical Q6H  . pantoprazole (PROTONIX) IV  40 mg Intravenous Q12H  . pravastatin  40 mg Oral QPM  . valACYclovir  500 mg Oral Daily  . ascorbic acid  500 mg Oral Daily   Continuous Infusions: . sodium chloride 50 mL/hr at 01/03/17 1144   PRN Meds: acetaminophen **OR** acetaminophen, bisacodyl, hydrALAZINE, ondansetron **OR** ondansetron (ZOFRAN) IV, polyvinyl alcohol  Allergies:    Allergies  Allergen Reactions  . Nitrofurantoin Itching  . Captopril Other (See Comments)    REACTION: unspecified  . Enalapril Maleate Cough  . Ramipril Other (See Comments)    REACTION: unspecified  . Sulfa Antibiotics Other (See Comments)    Reaction unknown  . Verapamil Other (See Comments)    REACTION: unspecified    Social History:   Social History   Socioeconomic History  . Marital status: Widowed    Spouse name: Not on file  . Number of children: 1  . Years of education: Not on file  . Highest education level: Not on file  Social Needs  . Financial resource strain: Not  on file  . Food insecurity - worry: Not on file  . Food insecurity - inability: Not on file  . Transportation needs - medical: Not on file  . Transportation needs - non-medical: Not on file  Occupational History  . Occupation: Retired Armed forces training and education officer  Tobacco Use  . Smoking status: Former Smoker    Years: 15.00    Types: Cigarettes    Last attempt to quit: 01/06/1943    Years since quitting: 74.0  . Smokeless tobacco: Never Used  Substance and Sexual Activity  . Alcohol use: Yes    Comment: Wine occasionally  . Drug use: No  . Sexual activity: No  Other Topics Concern  . Not on file  Social History Narrative   Son Evlyn Clines to make decisions for her   Discussed DNR and she requests --done 05/03/14   No tube feeds if cognitively unaware    Family History:    Family History  Family history unknown: Yes     ROS:  Please see the history of present illness.  Review of Systems  Unable to perform ROS: acuity of condition  Minimally communicative  Physical Exam/Data:   Vitals:   01/03/17 1230 01/03/17 1245 01/03/17 1315 01/03/17 1508  BP: (!) 191/67 (!) 170/62 (!) 163/56   Pulse: 91  (!) 101   Resp: (!) 28 (!) 21    Temp:      TempSrc:      SpO2: 100%  95% 94%  Weight:   96 lb (43.5 kg)   Height:   5\' 2"  (1.575 m)     Intake/Output Summary (Last 24 hours) at 01/03/2017 1706 Last data filed at 01/03/2017 1121 Gross per 24 hour  Intake 50 ml  Output -  Net 50 ml   Filed Weights   01/03/17 0836 01/03/17 1315  Weight: 98 lb (44.5 kg) 96 lb (43.5 kg)   Body mass index is 17.56 kg/m.  General:  Well nourished, well developed, in no acute distress Confused, laying supine in bed, mildly agitated HEENT: normal Lymph: no adenopathy Neck: no  JVD Endocrine:  No thryomegaly Vascular: No carotid bruits; FA pulses 2+ bilaterally without bruits  Cardiac:  normal S1, S2; RRR; no murmur  Lungs:  clear to auscultation bilaterally, no wheezing, rhonchi or rales   Abd: soft, nontender, no hepatomegaly  Ext: no edema Musculoskeletal:  No deformities, BUE and BLE strength normal and equal Skin: warm and dry  Neuro: Full neuro exam not performed as patient unable to perform Psych: Poor affect, alert but not oriented  EKG:  The EKG was personally reviewed and demonstrates: Normal sinus rhythm with old anterior MI no significant ST or T wave changes  Telemetry:  Telemetry was personally reviewed and demonstrates: Normal sinus rhythm   Relevant CV Studies:    Laboratory Data:  Chemistry Recent Labs  Lab 01/03/17 0835 01/03/17 1416  NA 137  --   K 4.2  --   CL 96*  --   CO2 25  --   GLUCOSE 110*  --   BUN 24*  --   CREATININE 3.94* 4.33*  CALCIUM 9.3  --   GFRNONAA 9* 8*  GFRAA 10* 9*  ANIONGAP 16*  --     Recent Labs  Lab 01/03/17 0835  PROT 7.8  ALBUMIN 3.9  AST 25  ALT 11*  ALKPHOS 138*  BILITOT 1.8*   Hematology Recent Labs  Lab 01/03/17 0835 01/03/17 1416  WBC 7.7 8.5  RBC 4.15 3.98  HGB 13.2 12.7  HCT 40.6 39.0  MCV 97.7 98.0  MCH 31.7 32.0  MCHC 32.5 32.6  RDW 19.7* 19.5*  PLT 144* 155   Cardiac Enzymes Recent Labs  Lab 01/03/17 0835 01/03/17 1416  TROPONINI 0.04* 0.04*   No results for input(s): TROPIPOC in the last 168 hours.  BNPNo results for input(s): BNP, PROBNP in the last 168 hours.  DDimer No results for input(s): DDIMER in the last 168 hours.  Radiology/Studies:  Ct Head Wo Contrast  Result Date: 01/03/2017 CLINICAL DATA:  83 year old female with a history of confusion, hallucinations. Recent viral infection. EXAM: CT HEAD WITHOUT CONTRAST TECHNIQUE: Contiguous axial images were obtained from the base of the skull through the vertex without intravenous contrast. COMPARISON:  CT 09/18/2015, MR 09/19/2015 FINDINGS: Brain: No acute intracranial hemorrhage. No midline shift or mass effect. Gray-white differentiation is maintained. Confluent hypodensity in the periventricular white matter.  Encephalomalacia of the [name] parietal cortex, in the region of known prior infarction. Unremarkable configuration the ventricles. Vascular: Calcifications of the anterior and posterior circulation. Skull: No acute displaced fracture.  No bony lesions. Sinuses/Orbits: Unremarkable Other: None IMPRESSION: Head CT negative for acute abnormality. Encephalomalacia [name] parietal lobe. Evidence of chronic microvascular ischemic disease and associated intracranial atherosclerosis. Electronically Signed   By: Corrie Mckusick D.O.   On: 01/03/2017 09:51   Dg Chest Port 1 View  Result Date: 01/03/2017 CLINICAL DATA:  Shortness of breath. EXAM: PORTABLE CHEST 1 VIEW COMPARISON:  09/18/2015 FINDINGS: Cardiac enlargement. Status post median sternotomy and CABG procedure. Bilateral pleural effusions are identified [name] greater than left. Mild diffuse edema. IMPRESSION: 1. Cardiac enlargement and moderate congestive heart failure. Electronically Signed   By: Kerby Moors M.D.   On: 01/03/2017 09:03    Assessment and Plan:   1) metabolic encephalopathy Urinary tract infection, worsening confusion past several days, noted last Friday 2 days ago during dialysis Prior history of UTI Started on antibiotics Blood pressure stable, heart rate mildly elevated Currently on IV fluids started by admitting team  2) pulmonary edema Scheduled for urgent  dialysis today Worsening shortness of breath per family past 1-2 weeks Some dietary indiscretion, eating at the hibachi grill, likes the soup.  Has several bowls May benefit from Lasix on nondialysis days -Recommend we hold IV fluids [name] she is going to dialysis and she was admitted for pulmonary edema, suggested we confirm with Dr. Candiss Norse   3) CAD, history of CABG With cardiac enzymes negative EKG unchanged from previous studies dating back to May 2018 No ischemic workup planned at this time  4) end-stage renal disease Seen by Dr. Candiss Norse, scheduled for dialysis  today Recent dietary indiscretion, may need Lasix between dialysis or longer dialysis time Discussed hibachi Grill with her son, need for low-sodium intake  5) hypertension Moderately elevated pressures, Would continue outpatient regimen    Total encounter time more than 110 minutes  Greater than 50% was spent in counseling and coordination of care with the patient    For questions or updates, please contact [facility] Please consult [email or web address] for contact info under Cardiology/STEMI.   Signed, Ida Rogue, MD  01/03/2017 5:06 PM

## 2017-01-03 NOTE — Progress Notes (Signed)
HD TX ended  

## 2017-01-03 NOTE — Progress Notes (Signed)
ANTIBIOTIC CONSULT NOTE - INITIAL  Pharmacy Consult for cefepime Indication: UTI  Allergies  Allergen Reactions  . Nitrofurantoin Itching  . Captopril Other (See Comments)    REACTION: unspecified  . Enalapril Maleate Cough  . Ramipril Other (See Comments)    REACTION: unspecified  . Sulfa Antibiotics Other (See Comments)    Reaction unknown  . Verapamil Other (See Comments)    REACTION: unspecified    Patient Measurements: Weight: 98 lb (44.5 kg) Adjusted Body Weight:   Vital Signs: Temp: 97.6 F (36.4 C) (12/30 0837) Temp Source: Oral (12/30 0837) BP: 186/75 (12/30 1000) Pulse Rate: 90 (12/30 1000) Intake/Output from previous day: No intake/output data recorded. Intake/Output from this shift: No intake/output data recorded.  Labs: Recent Labs    01/03/17 0835  WBC 7.7  HGB 13.2  PLT 144*  CREATININE 3.94*   Estimated Creatinine Clearance: 6.1 mL/min (A) (by C-G formula based on SCr of 3.94 mg/dL (H)). No results for input(s): VANCOTROUGH, VANCOPEAK, VANCORANDOM, GENTTROUGH, GENTPEAK, GENTRANDOM, TOBRATROUGH, TOBRAPEAK, TOBRARND, AMIKACINPEAK, AMIKACINTROU, AMIKACIN in the last 72 hours.   Microbiology: No results found for this or any previous visit (from the past 720 hour(s)).  Medical History: Past Medical History:  Diagnosis Date  . Anemia    NOS  . Anxiety   . Aortic stenosis   . Cancer [facility])    Mole on top of head to be removed in July 2013  . CHF (congestive heart failure) ([place])   . Constipation   . Coronary artery disease   . Diabetes mellitus    type II;was on Glipizide but has been off x 65mon  . Dialysis patient ([place])   . Diarrhea   . GERD (gastroesophageal reflux disease)   . History of blood transfusion   . Hyperlipidemia    takes Simvastatin nightly  . Hyperlipidemia   . Hypertension    takes Metoprolol daily  . Hypothyroidism    takes Synthroid daily  . Migraine    hx of  . Oligouria   . Osteoarthritis   . Osteopenia   .  Peripheral vascular disease ([place])   . Personal history of colonic polyps   . Pulmonary hypertension ([place])   . Renal insufficiency   . Urinary incontinence   . UTI (urinary tract infection)     Medications:  Infusions:  . ceFEPime (MAXIPIME) IV 2 g (01/03/17 1022)   Assessment: 89 yof cc confusion with PMH significant for ESRD on HD with recent treatment of shingles with Valtrex. Afebrile, HR WNL, hypertensive, moderate respiratory distress. Per EDP smells strongly of urinary tract infection. Blood cultures sent. Pharmacy consulted to dose cefepime for UTI.  Goal of Therapy:  Resolve infection Prevent ADE  Plan:  Cefepime 2 gm IV x 1 in ED. Will continue as cefepime 500 mg IV Q24H (HD dosing) when patient gets to the floor.  Laural Benes, Pharm.D., BCPS Clinical Pharmacist 01/03/2017,10:34 AM

## 2017-01-03 NOTE — Progress Notes (Signed)
HD started. 

## 2017-01-03 NOTE — H&P (Signed)
History and Physical    [name] HYW:[id number] DOB: 1920-07-22 DOA: 01/03/2017  Referring physician: [name] PCP: [name], MD  Specialists: Dr. Rockey Situ  Chief Complaint: SOB and confusion  HPI: [name] is a 83 y.o. female has a past medical history significant for CAD, CHF, HTN, and ESRD on HD who normally is independent and lives alone now with 2-3 day hx of worsening SOB and confusion with hallucinations. Last HD was Friday. In ER, pt was hypertensive and tachycardic as well as hypoxic. Labs show UTI and CXR shows CHF. Denies CP. No fever. She is now admitted.  Review of Systems: The patient denies anorexia, fever, weight loss,, vision loss, decreased hearing, hoarseness, chest pain, syncope, peripheral edema, balance deficits, hemoptysis, abdominal pain, melena, hematochezia, severe indigestion/heartburn, hematuria, incontinence, genital sores, muscle weakness, suspicious skin lesions, transient blindness, difficulty walking, depression, unusual weight change, abnormal bleeding, enlarged lymph nodes, angioedema, and breast masses.   Past Medical History:  Diagnosis Date  . Anemia    NOS  . Anxiety   . Aortic stenosis   . Cancer [facility])    Mole on top of head to be removed in July 2013  . CHF (congestive heart failure) ([place])   . Constipation   . Coronary artery disease   . Diabetes mellitus    type II;was on Glipizide but has been off x 51mon  . Dialysis patient ([place])   . Diarrhea   . GERD (gastroesophageal reflux disease)   . History of blood transfusion   . Hyperlipidemia    takes Simvastatin nightly  . Hyperlipidemia   . Hypertension    takes Metoprolol daily  . Hypothyroidism    takes Synthroid daily  . Migraine    hx of  . Oligouria   . Osteoarthritis   . Osteopenia   . Peripheral vascular disease ([place])   . Personal history of colonic polyps   . Pulmonary hypertension ([place])   . Renal insufficiency   . Urinary incontinence   . UTI (urinary  tract infection)    Past Surgical History:  Procedure Laterality Date  . ABDOMINAL HYSTERECTOMY    . adenosine myoview  2007   benign, EF 69%  . AMPUTATION  06/18/2011   Procedure: AMPUTATION DIGIT;  Surgeon: Newt Minion, MD;  Location: [place];  Service: Orthopedics;  Laterality: Left;  Left 2nd Toe Amputation at MTP Joint  . Amputation-left great toe  7/12   Dr Marlou Sa  . AORTIC VALVE REPLACEMENT  2009  . APPENDECTOMY    . AV FISTULA PLACEMENT  12/22/2010   Procedure: ARTERIOVENOUS (AV) FISTULA CREATION;  Surgeon: Hinda Lenis, MD;  Location: [place];  Service: Vascular;  Laterality: Left;  Creation of Left Brachial-Cephalic Fistula  . CARDIAC CATHETERIZATION  2000   cad  . CAROTID ENDARTERECTOMY  2011   Right  . CATARACT EXTRACTION    . CORONARY ARTERY BYPASS GRAFT     od  . CORONARY ARTERY BYPASS GRAFT  2009  . EYE SURGERY     BIlateral  . INSERTION OF DIALYSIS CATHETER  12/18/2010   Procedure: INSERTION OF DIALYSIS CATHETER;  Surgeon: Mal Misty, MD;  Location: [place];  Service: Vascular;  Laterality: Right;  . REPLACEMENT TOTAL KNEE BILATERAL  05/1998  . TONSILLECTOMY    . Traumatic Amputation of Right DIP joint of Index Finger     Social History:  reports that she quit smoking about 74 years ago. Her smoking use  included cigarettes. She quit after 15.00 years of use. she has never used smokeless tobacco. She reports that she drinks alcohol. She reports that she does not use drugs.  Allergies  Allergen Reactions  . Nitrofurantoin Itching  . Captopril Other (See Comments)    REACTION: unspecified  . Enalapril Maleate Cough  . Ramipril Other (See Comments)    REACTION: unspecified  . Sulfa Antibiotics Other (See Comments)    Reaction unknown  . Verapamil Other (See Comments)    REACTION: unspecified    Family History  Family history unknown: Yes    Prior to Admission medications   Medication Sig Start Date End Date Taking? Authorizing Provider  amLODipine  (NORVASC) 5 MG tablet Take 1 tablet (5 mg total) by mouth daily. 11/12/15  Yes Viviana Simpler I, MD  aspirin EC 81 MG tablet Take 81 mg by mouth at bedtime.   Yes [provider]  calcium carbonate (TUMS - DOSED IN MG ELEMENTAL CALCIUM) 500 MG chewable tablet Chew 1 tablet (200 mg of elemental calcium total) by mouth 3 (three) times daily with meals. 12/06/12  Yes Eulogio Bear U, DO  cloNIDine (CATAPRES) 0.1 MG tablet Take 1 tablet (0.1 mg total) by mouth 2 (two) times daily. 02/13/16  Yes [name], MD  clopidogrel (PLAVIX) 75 MG tablet Take 1 tablet (75 mg total) by mouth daily. 01/20/16  Yes [name], MD  folic acid (FOLVITE) 0.5 MG tablet Take 0.5 tablets (0.5 mg total) by mouth daily. 12/06/12  Yes Vann, Jessica U, DO  hydrALAZINE (APRESOLINE) 25 MG tablet TAKE ONE TABLET BY MOUTH EVERY 8 HOURS. 04/01/16  Yes [name], MD  levothyroxine (SYNTHROID, LEVOTHROID) 75 MCG tablet TAKE ONE TABLET BY MOUTH ONCE DAILY 07/07/16  Yes [name], MD  metoprolol (LOPRESSOR) 50 MG tablet Take 1 tablet (50 mg total) by mouth 2 (two) times daily. 11/25/15  Yes Gollan, Kathlene November, MD  polyvinyl alcohol (LIQUIFILM TEARS) 1.4 % ophthalmic solution Place 1 drop into both eyes as needed for dry eyes. 09/22/15  Yes Fritzi Mandes, MD  pravastatin (PRAVACHOL) 40 MG tablet TAKE ONE TABLET BY MOUTH IN THE EVENING 07/21/16  Yes Gollan, Kathlene November, MD  valACYclovir (VALTREX) 500 MG tablet Take 500 mg by mouth daily. 12/31/16 01/14/17 Yes [provider]  vitamin C (VITAMIN C) 500 MG tablet Take 1 tablet (500 mg total) by mouth daily. 12/06/12  Yes Geradine Girt, DO   Physical Exam: Vitals:   01/03/17 1000 01/03/17 1032 01/03/17 1054 01/03/17 1129  BP: (!) 186/75 (!) 208/71 (!) 188/78 (!) 202/78  Pulse: 90 92 91 91  Resp: 19 (!) 29 (!) 24 (!) 29  Temp:      TempSrc:      SpO2: 99% 97% 98% 97%  Weight:         General:  No apparent distress, WDWN, [place]/AT  Eyes: PERRL, EOMI,  no scleral icterus, conjunctiva clear  ENT: moist oropharynx without exudate, TM's benign, dentition fair  Neck: supple, no lymphadenopathy. No bruits or thyromegaly  Cardiovascular: rapid rate with regular rhythm without MRG; 2+ peripheral pulses, no JVD, trace peripheral edema  Respiratory: basilar rales without wheezes or rhonchi,. No dullness. Respiratory effort increased  Abdomen: soft, non tender to palpation, positive bowel sounds, no guarding, no rebound  Skin: no rashes or lesions  Musculoskeletal: normal bulk and tone, no joint swelling  Psychiatric: normal mood and affect, A&OX3(not to time)  Neurologic: CN 2-12 grossly intact,  Motor strength 5/5 in all 4 groups with symmetric DTR's and non-focal sensory exam  Labs on Admission:  Basic Metabolic Panel: Recent Labs  Lab 01/03/17 0835  NA 137  K 4.2  CL 96*  CO2 25  GLUCOSE 110*  BUN 24*  CREATININE 3.94*  CALCIUM 9.3   Liver Function Tests: Recent Labs  Lab 01/03/17 0835  AST 25  ALT 11*  ALKPHOS 138*  BILITOT 1.8*  PROT 7.8  ALBUMIN 3.9   No results for input(s): LIPASE, AMYLASE in the last 168 hours. No results for input(s): AMMONIA in the last 168 hours. CBC: Recent Labs  Lab 01/03/17 0835  WBC 7.7  NEUTROABS 6.4  HGB 13.2  HCT 40.6  MCV 97.7  PLT 144*   Cardiac Enzymes: Recent Labs  Lab 01/03/17 0835  TROPONINI 0.04*    BNP (last 3 results) No results for input(s): BNP in the last 8760 hours.  ProBNP (last 3 results) No results for input(s): PROBNP in the last 8760 hours.  CBG: No results for input(s): GLUCAP in the last 168 hours.  Radiological Exams on Admission: Ct Head Wo Contrast  Result Date: 01/03/2017 CLINICAL DATA:  83 year old female with a history of confusion, hallucinations. Recent viral infection. EXAM: CT HEAD WITHOUT CONTRAST TECHNIQUE: Contiguous axial images were obtained from the base of the skull through the vertex without intravenous contrast. COMPARISON:   CT 09/18/2015, MR 09/19/2015 FINDINGS: Brain: No acute intracranial hemorrhage. No midline shift or mass effect. Gray-white differentiation is maintained. Confluent hypodensity in the periventricular white matter. Encephalomalacia of the right parietal cortex, in the region of known prior infarction. Unremarkable configuration the ventricles. Vascular: Calcifications of the anterior and posterior circulation. Skull: No acute displaced fracture.  No bony lesions. Sinuses/Orbits: Unremarkable Other: None IMPRESSION: Head CT negative for acute abnormality. Encephalomalacia right parietal lobe. Evidence of chronic microvascular ischemic disease and associated intracranial atherosclerosis. Electronically Signed   By: Corrie Mckusick D.O.   On: 01/03/2017 09:51   Dg Chest Port 1 View  Result Date: 01/03/2017 CLINICAL DATA:  Shortness of breath. EXAM: PORTABLE CHEST 1 VIEW COMPARISON:  09/18/2015 FINDINGS: Cardiac enlargement. Status post median sternotomy and CABG procedure. Bilateral pleural effusions are identified right greater than left. Mild diffuse edema. IMPRESSION: 1. Cardiac enlargement and moderate congestive heart failure. Electronically Signed   By: Kerby Moors M.D.   On: 01/03/2017 09:03    EKG: Independently reviewed.  Assessment/Plan Principal Problem:   CHF (congestive heart failure) (HCC) Active Problems:   End stage renal disease on dialysis [facility])   UTI (urinary tract infection)   Acute metabolic encephalopathy   Will admit to floor with telemetry as DNR. Begin IV lasix and IV ABX with O2 and SVN's. Consult Nephrology and Cardiology. Echo ordered. Repeat labs in AM. Wean O2 as tolerated  Diet: soft Fluids: NS@KVO  DVT Prophylaxis: SQ Heparin  Code Status: DNR  Family Communication: yes  Disposition Plan: home  Time spent: 50 min

## 2017-01-03 NOTE — ED Notes (Signed)
Pt son at bedside and verified that pt is DNR. DNR armband placed on right wrist.

## 2017-01-03 NOTE — Progress Notes (Signed)
HD tx ended 

## 2017-01-03 NOTE — Progress Notes (Signed)
PRE HS ASSESSMENT

## 2017-01-03 NOTE — ED Triage Notes (Signed)
Pt arrived via EMS from home for reports of confusion, hallucinations and strong smell of urine. Pt recently started valtrex for shingles right eye. EMS reports 200/80, CBG 132.

## 2017-01-03 NOTE — NC FL2 (Signed)
[place] LEVEL OF CARE SCREENING TOOL     IDENTIFICATION  Patient Name: [name] Birthdate: 16-Mar-1920 Sex: female Admission Date (Current Location): 01/03/2017  [place] and [place] Number:  Engineering geologist and Address:  [facility], [address], [place], [place] [address]      Provider Number: [id number]  Attending Physician Name and Address:  Idelle Crouch, MD  Relative Name and Phone Number:   Tyneisha Hegeman [phone]    Current Level of Care: Hospital Recommended Level of Care: [place] Prior Approval Number:    Date Approved/Denied:   PASRR Number:   [id number]   Discharge Plan: SNF    Current Diagnoses: Patient Active Problem List   Diagnosis Date Noted  . CHF (congestive heart failure) ([place]) 01/03/2017  . UTI (urinary tract infection) 01/03/2017  . Acute metabolic encephalopathy 27/07/8673  . Cerebrovascular accident (CVA) due to occlusion of right middle cerebral artery ([place]) 10/24/2015  . Malnutrition of mild degree ([place]) 10/24/2015  . Altered mental status 09/18/2015  . Preventative health care 05/03/2014  . Peripheral vascular disease ([place]) 12/21/2013  . Toe amputation status ([place]) 08/29/2013  . History of recent fall 04/18/2013  . HTN (hypertension), malignant 03/22/2013  . S/P aortic valve replacement with bioprosthetic valve 11/15/2012  . S/P CABG x 3 11/15/2012  . Anemia of chronic kidney failure 12/22/2010  . Thrombocytopenia ([place]) 12/13/2010  . End stage renal disease on dialysis ([place]) 12/13/2010  . S/P aortic valve replacement 08/11/2010  . Arterial insufficiency-lower 07/25/2010  . NEURODERMATITIS 04/08/2009  . Carotid arterial disease ([place]) 01/01/2009  . Coronary atherosclerosis 02/11/2007  . AORTIC STENOSIS 10/04/2006  . ANXIETY 05/30/2006  . Pulmonary hypertension ([place]) 05/30/2006  . Hypothyroidism 05/27/2006  . Type 2 diabetes mellitus with renal manifestations,  controlled ([place]) 05/27/2006  . Hyperlipidemia 05/27/2006  . Essential hypertension 05/27/2006  . GERD 05/27/2006  . OSTEOARTHRITIS 05/27/2006  . OSTEOPENIA 05/27/2006  . URINARY INCONTINENCE 05/27/2006    Orientation RESPIRATION BLADDER Height & Weight     Self, Situation  Normal Continent Weight: 98 lb (44.5 kg) Height:     BEHAVIORAL SYMPTOMS/MOOD NEUROLOGICAL BOWEL NUTRITION STATUS      Continent Diet(Diabetic)  AMBULATORY STATUS COMMUNICATION OF NEEDS Skin   Supervision Verbally Normal                       Personal Care Assistance Level of Assistance  Bathing, Feeding, Dressing, Total care Bathing Assistance: Limited assistance Feeding assistance: Independent Dressing Assistance: Limited assistance Total Care Assistance: Limited assistance   Functional Limitations Info  Sight, Hearing, Speech(left eye poor vision) Sight Info: Impaired Hearing Info: Impaired(hard of hearing) Speech Info: Adequate    SPECIAL CARE FACTORS FREQUENCY  PT (By licensed PT), OT (By licensed OT)     PT Frequency: x5 OT Frequency: x5            Contractures Contractures Info: Not present    Additional Factors Info  Code Status Code Status Info: DNR             Current Medications (01/03/2017):  This is the current hospital active medication list Current Facility-Administered Medications  Medication Dose Route Frequency Provider Last Rate Last Dose  . 0.9 %  sodium chloride infusion   Intravenous Continuous Idelle Crouch, MD 50 mL/hr at 01/03/17 1144    . [START ON 01/04/2017] ceFEPIme (MAXIPIME) 500 mg in dextrose 5 % 50 mL IVPB  500  mg Intravenous Q24H Merlyn Lot, MD      . furosemide (LASIX) injection 80 mg  80 mg Intravenous Q12H Idelle Crouch, MD   80 mg at 01/03/17 1206  . hydrALAZINE (APRESOLINE) injection 10 mg  10 mg Intravenous Q4H PRN Idelle Crouch, MD   10 mg at 01/03/17 1224  . ipratropium-albuterol (DUONEB) 0.5-2.5 (3) MG/3ML nebulizer  solution 3 mL  3 mL Nebulization QID Idelle Crouch, MD   3 mL at 01/03/17 1215  . nitroGLYCERIN (NITROGLYN) 2 % ointment 1 inch  1 inch Topical Q6H Idelle Crouch, MD   1 inch at 01/03/17 1217  . pantoprazole (PROTONIX) injection 40 mg  40 mg Intravenous Q12H Idelle Crouch, MD   40 mg at 01/03/17 1210   Current Outpatient Medications  Medication Sig Dispense Refill  . amLODipine (NORVASC) 5 MG tablet Take 1 tablet (5 mg total) by mouth daily. 90 tablet 3  . aspirin EC 81 MG tablet Take 81 mg by mouth at bedtime.    . calcium carbonate (TUMS - DOSED IN MG ELEMENTAL CALCIUM) 500 MG chewable tablet Chew 1 tablet (200 mg of elemental calcium total) by mouth 3 (three) times daily with meals.    . cloNIDine (CATAPRES) 0.1 MG tablet Take 1 tablet (0.1 mg total) by mouth 2 (two) times daily. 60 tablet 11  . clopidogrel (PLAVIX) 75 MG tablet Take 1 tablet (75 mg total) by mouth daily. 30 tablet 11  . folic acid (FOLVITE) 0.5 MG tablet Take 0.5 tablets (0.5 mg total) by mouth daily.    . hydrALAZINE (APRESOLINE) 25 MG tablet TAKE ONE TABLET BY MOUTH EVERY 8 HOURS. 90 tablet 0  . levothyroxine (SYNTHROID, LEVOTHROID) 75 MCG tablet TAKE ONE TABLET BY MOUTH ONCE DAILY 30 tablet 11  . metoprolol (LOPRESSOR) 50 MG tablet Take 1 tablet (50 mg total) by mouth 2 (two) times daily. 180 tablet 3  . polyvinyl alcohol (LIQUIFILM TEARS) 1.4 % ophthalmic solution Place 1 drop into both eyes as needed for dry eyes. 15 mL 0  . pravastatin (PRAVACHOL) 40 MG tablet TAKE ONE TABLET BY MOUTH IN THE EVENING 90 tablet 4  . valACYclovir (VALTREX) 500 MG tablet Take 500 mg by mouth daily.    . vitamin C (VITAMIN C) 500 MG tablet Take 1 tablet (500 mg total) by mouth daily.       Discharge Medications: Please see discharge summary for a list of discharge medications.  Relevant Imaging Results:  Relevant Lab Results:   Additional Information SSN [id number]  Joana Reamer, [place]

## 2017-01-03 NOTE — ED Notes (Signed)
Patient transported to CT 

## 2017-01-03 NOTE — Progress Notes (Signed)
[place], [place] 01/03/17  Subjective:   Patient known to [place] from previous admissions.  She was more seen in the hospital in 09/2015 Patient was brought into the emergency room via EMS for confusion, hallucinations and strong urinary odor.  According to ER notes, she was recently started on Valtrex for right eye shingles.  Upon arrival, blood pressure was 200/80.  Blood sugar was 132.  Patient is a chronic dialysis patient.  She dialyzes MWF shaped stenosis was on Friday.  Her son reports that she lives independently and is able to drive normally.  At present she is very confused and disoriented.  She is unable to provide any meaningful information.  Objective:  Vital signs in last 24 hours:  Temp:  [97.6 F (36.4 C)] 97.6 F (36.4 C) (12/30 0837) Pulse Rate:  [90-101] 101 (12/30 1315) Resp:  [19-32] 21 (12/30 1245) BP: (163-223)/(56-78) 163/56 (12/30 1315) SpO2:  [91 %-100 %] 94 % (12/30 1508) Weight:  [44.5 kg (98 lb)] 44.5 kg (98 lb) (12/30 0836)  Weight change:  Filed Weights   01/03/17 0836  Weight: 44.5 kg (98 lb)    Intake/Output:    Intake/Output Summary (Last 24 hours) at 01/03/2017 1529 Last data filed at 01/03/2017 1121 Gross per 24 hour  Intake 50 ml  Output -  Net 50 ml     Physical Exam: General:  Frail, elderly, laying in the bed  HEENT  dry oral mucous membranes, decreased hearing  Neck  distended neck veins  Pulm/lungs  bilateral diffuse crackles  CVS/Heart  tachycardic, irregular  Abdomen:   Soft, nontender  Extremities:  Trace pitting edema  Neurologic:  Confused, able to follow simple commands  Skin:  Scattered ecchymosis  Access:  AV fistula       Basic Metabolic Panel:  Recent Labs  Lab 01/03/17 0835 01/03/17 1416  NA 137  --   K 4.2  --   CL 96*  --   CO2 25  --   GLUCOSE 110*  --   BUN 24*  --   CREATININE 3.94* 4.33*  CALCIUM 9.3  --      CBC: Recent Labs  Lab 01/03/17 0835 01/03/17 1416   WBC 7.7 8.5  NEUTROABS 6.4  --   HGB 13.2 12.7  HCT 40.6 39.0  MCV 97.7 98.0  PLT 144* 155      Lab Results  Component Value Date   HEPBSAG Negative 09/20/2015   HEPBSAB POSITIVE (A) 03/24/2013   HEPBIGM NEGATIVE 11/23/2010      Microbiology:  No results found for this or any previous visit (from the past 240 hour(s)).  Coagulation Studies: No results for input(s): LABPROT, INR in the last 72 hours.  Urinalysis: Recent Labs    01/03/17 0836  COLORURINE AMBER*  LABSPEC 1.011  PHURINE 7.0  GLUCOSEU NEGATIVE  HGBUR SMALL*  BILIRUBINUR NEGATIVE  KETONESUR NEGATIVE  PROTEINUR 100*  NITRITE NEGATIVE  LEUKOCYTESUR NEGATIVE      Imaging: Ct Head Wo Contrast  Result Date: 01/03/2017 CLINICAL DATA:  83 year old female with a history of confusion, hallucinations. Recent viral infection. EXAM: CT HEAD WITHOUT CONTRAST TECHNIQUE: Contiguous axial images were obtained from the base of the skull through the vertex without intravenous contrast. COMPARISON:  CT 09/18/2015, MR 09/19/2015 FINDINGS: Brain: No acute intracranial hemorrhage. No midline shift or mass effect. Gray-white differentiation is maintained. Confluent hypodensity in the periventricular white matter. Encephalomalacia of the right parietal cortex, in the region of known prior  infarction. Unremarkable configuration the ventricles. Vascular: Calcifications of the anterior and posterior circulation. Skull: No acute displaced fracture.  No bony lesions. Sinuses/Orbits: Unremarkable Other: None IMPRESSION: Head CT negative for acute abnormality. Encephalomalacia right parietal lobe. Evidence of chronic microvascular ischemic disease and associated intracranial atherosclerosis. Electronically Signed   By: Corrie Mckusick D.O.   On: 01/03/2017 09:51   Dg Chest Port 1 View  Result Date: 01/03/2017 CLINICAL DATA:  Shortness of breath. EXAM: PORTABLE CHEST 1 VIEW COMPARISON:  09/18/2015 FINDINGS: Cardiac enlargement. Status  post median sternotomy and CABG procedure. Bilateral pleural effusions are identified right greater than left. Mild diffuse edema. IMPRESSION: 1. Cardiac enlargement and moderate congestive heart failure. Electronically Signed   By: Kerby Moors M.D.   On: 01/03/2017 09:03     Medications:   . sodium chloride 50 mL/hr at 01/03/17 1144   . amLODipine  5 mg Oral Daily  . aspirin EC  81 mg Oral QHS  . calcium carbonate  1 tablet Oral TID WC  . [START ON 01/04/2017] ceFEPime (MAXIPIME) IV  500 mg Intravenous Q24H  . cloNIDine  0.1 mg Oral BID  . clopidogrel  75 mg Oral Daily  . docusate sodium  100 mg Oral BID  . folic acid  0.5 mg Oral Daily  . furosemide  80 mg Intravenous Q12H  . heparin  5,000 Units Subcutaneous Q8H  . hydrALAZINE  25 mg Oral Q8H  . ipratropium-albuterol  3 mL Nebulization QID  . [START ON 01/04/2017] levothyroxine  75 mcg Oral Daily  . metoprolol tartrate  50 mg Oral BID  . nitroGLYCERIN  1 inch Topical Q6H  . pantoprazole (PROTONIX) IV  40 mg Intravenous Q12H  . pravastatin  40 mg Oral QPM  . valACYclovir  500 mg Oral Daily  . ascorbic acid  500 mg Oral Daily   acetaminophen **OR** acetaminophen, bisacodyl, hydrALAZINE, ondansetron **OR** ondansetron (ZOFRAN) IV, polyvinyl alcohol  Assessment/ Plan:  83 y.o. Caucasian female with end-stage renal disease, history of CABG 2009, history of bilateral knee replacement, aortic valve replacement 2009, diabetes, hypertension, hypothyroidism, peripheral vascular disease, pulmonary hypertension, anxiety Patient is admitted for confusion, hallucinations and altered mental status and is diagnosed with UTI  [address] neph[facility]/MWF  1.  ESRD 2.  Altered mental status 3.  Urinary tract infection 4.  Acute pulmonary edema 5.  Hypertension 6.  Anemia of chronic kidney disease, hemoglobin 12.7 7.  Secondary hyperparathyroidism  Patient had normal movement and has been responded well to IV Lasix.  We will  order urgent dialysis for volume optimization.  Her hemoglobin is above goal therefore we will hold Procrit.  We will monitor phosphorus during this hospital admission.  Hopefully her mental status will be improved with treatment of urinary tract infection.   LOS: 0 Kaio Kuhlman 12/30/20183:29 PM  [facility] [place], [place]

## 2017-01-03 NOTE — ED Notes (Signed)
Date and time results received: 01/03/17 0941 (use smartphrase ".now" to insert current time)  Test: troponin Critical Value: 0.04  Name of Provider Notified: [name]  Orders Received? Or Actions Taken?: Orders Received - See Orders for details

## 2017-01-04 DIAGNOSIS — N186 End stage renal disease: Secondary | ICD-10-CM | POA: Diagnosis not present

## 2017-01-04 DIAGNOSIS — I120 Hypertensive chronic kidney disease with stage 5 chronic kidney disease or end stage renal disease: Secondary | ICD-10-CM | POA: Diagnosis not present

## 2017-01-04 DIAGNOSIS — Z992 Dependence on renal dialysis: Secondary | ICD-10-CM | POA: Diagnosis not present

## 2017-01-04 LAB — CBC
HCT: 33.2 % — ABNORMAL LOW (ref 35.0–47.0)
Hemoglobin: 10.7 g/dL — ABNORMAL LOW (ref 12.0–16.0)
MCH: 31.5 pg (ref 26.0–34.0)
MCHC: 32.3 g/dL (ref 32.0–36.0)
MCV: 97.5 fL (ref 80.0–100.0)
PLATELETS: 131 10*3/uL — AB (ref 150–440)
RBC: 3.41 MIL/uL — AB (ref 3.80–5.20)
RDW: 19.3 % — AB (ref 11.5–14.5)
WBC: 7.4 10*3/uL (ref 3.6–11.0)

## 2017-01-04 LAB — COMPREHENSIVE METABOLIC PANEL
ALT: 8 U/L — AB (ref 14–54)
AST: 22 U/L (ref 15–41)
Albumin: 3.2 g/dL — ABNORMAL LOW (ref 3.5–5.0)
Alkaline Phosphatase: 103 U/L (ref 38–126)
Anion gap: 11 (ref 5–15)
BILIRUBIN TOTAL: 1.8 mg/dL — AB (ref 0.3–1.2)
BUN: 10 mg/dL (ref 6–20)
CALCIUM: 8.4 mg/dL — AB (ref 8.9–10.3)
CHLORIDE: 99 mmol/L — AB (ref 101–111)
CO2: 29 mmol/L (ref 22–32)
CREATININE: 2.43 mg/dL — AB (ref 0.44–1.00)
GFR, EST AFRICAN AMERICAN: 19 mL/min — AB (ref >60)
GFR, EST NON AFRICAN AMERICAN: 16 mL/min — AB (ref >60)
Glucose, Bld: 117 mg/dL — ABNORMAL HIGH (ref 65–99)
Potassium: 3.6 mmol/L (ref 3.5–5.1)
Sodium: 139 mmol/L (ref 135–145)
TOTAL PROTEIN: 6.3 g/dL — AB (ref 6.5–8.1)

## 2017-01-04 LAB — TROPONIN I: TROPONIN I: 0.07 ng/mL — AB (ref <0.03)

## 2017-01-04 MED ORDER — EPOETIN ALFA 10000 UNIT/ML IJ SOLN
4000.0000 [IU] | INTRAMUSCULAR | Status: DC
  Administered 2017-01-06 – 2017-01-08 (×2): 4000 [IU] via INTRAVENOUS

## 2017-01-04 MED ORDER — GANCICLOVIR 0.15 % OP GEL
1.0000 [drp] | Freq: Four times a day (QID) | OPHTHALMIC | Status: DC
  Administered 2017-01-04 – 2017-01-07 (×12): 1 [drp] via OPHTHALMIC
  Filled 2017-01-04: qty 5

## 2017-01-04 MED ORDER — DEXTROSE 5 % IV SOLN
500.0000 mg | INTRAVENOUS | Status: DC
  Administered 2017-01-04 – 2017-01-05 (×2): 500 mg via INTRAVENOUS
  Filled 2017-01-04 (×3): qty 0.5

## 2017-01-04 NOTE — Progress Notes (Signed)
HD start 

## 2017-01-04 NOTE — Progress Notes (Signed)
PT Cancellation Note  Patient Details Name: JOYCELINE MAIORINO MRN: [id number] DOB: [birth year]   Cancelled Treatment:    Reason Eval/Treat Not Completed: Other (comment)(Consult received and chart reviewed.  Patient sleeping upon arrival to room; requests therapist hold at this time and re-attempt after patient has had opportunity to rest.  Will continue efforts in PM (scheduled for dialysis later in day))   Taunja Brickner H. Owens Shark, PT, DPT, NCS 01/04/17, 10:39 AM [phone]

## 2017-01-04 NOTE — Progress Notes (Signed)
HD tx end  

## 2017-01-04 NOTE — Progress Notes (Signed)
[place], [place] 01/04/17  Subjective:   Patient underwent urgent dialysis yesterday.  2 L of fluid was removed. She tolerated the procedure well.  This morning her breathing is much improved.  Her son is at bedside who reports that her mental status is improved but not quite back to baseline.  She is able to follow simple commands and answer some questions.  She was able to eat some without nausea or vomiting.   Objective:  Vital signs in last 24 hours:  Temp:  [97.4 F (36.3 C)-98.2 F (36.8 C)] 98.2 F (36.8 C) (12/31 0419) Pulse Rate:  [90-103] 90 (12/31 0419) Resp:  [16-32] 16 (12/31 0419) BP: (149-208)/(48-161) 149/82 (12/31 0419) SpO2:  [93 %-100 %] 93 % (12/31 0419) Weight:  [42 kg (92 lb 11.2 oz)-43.5 kg (96 lb)] 42 kg (92 lb 11.2 oz) (12/31 0414)  Weight change:  Filed Weights   01/03/17 1315 01/03/17 1752 01/04/17 0414  Weight: 43.5 kg (96 lb) 43.5 kg (95 lb 14.4 oz) 42 kg (92 lb 11.2 oz)    Intake/Output:    Intake/Output Summary (Last 24 hours) at 01/04/2017 0947 Last data filed at 01/03/2017 2300 Gross per 24 hour  Intake 338 ml  Output 2000 ml  Net -1662 ml     Physical Exam: General:  Frail, elderly, laying in the bed  HEENT  dry oral mucous membranes, decreased hearing  Neck  no distended neck veins   Pulm/lungs  bilateral diffuse crackles (improved)  CVS/Heart  tachycardic, irregular  Abdomen:   Soft, nontender  Extremities:  Trace pitting edema  Neurologic:  Confused, able to follow simple commands  Skin:  Scattered ecchymosis  Access:  AV fistula       Basic Metabolic Panel:  Recent Labs  Lab 01/03/17 0835 01/03/17 1416 01/03/17 2020 01/04/17 0117  NA 137  --   --  139  K 4.2  --   --  3.6  CL 96*  --   --  99*  CO2 25  --   --  29  GLUCOSE 110*  --   --  117*  BUN 24*  --   --  10  CREATININE 3.94* 4.33*  --  2.43*  CALCIUM 9.3  --   --  8.4*  PHOS  --   --  2.2*  --      CBC: Recent Labs   Lab 01/03/17 0835 01/03/17 1416 01/04/17 0117  WBC 7.7 8.5 7.4  NEUTROABS 6.4  --   --   HGB 13.2 12.7 10.7*  HCT 40.6 39.0 33.2*  MCV 97.7 98.0 97.5  PLT 144* 155 131*      Lab Results  Component Value Date   HEPBSAG Negative 09/20/2015   HEPBSAB POSITIVE (A) 03/24/2013   HEPBIGM NEGATIVE 11/23/2010      Microbiology:  Recent Results (from the past 240 hour(s))  Blood Culture (routine x 2)     Status: None (Preliminary result)   Collection Time: 01/03/17  8:36 AM  Result Value Ref Range Status   Specimen Description BLOOD BLOOD RIGHT HAND  Final   Special Requests   Final    BOTTLES DRAWN AEROBIC AND ANAEROBIC Blood Culture results may not be optimal due to an inadequate volume of blood received in culture bottles   Culture   Final    NO GROWTH < 24 HOURS Performed at [facility], [address], [place], [place] [address]    Report Status PENDING  Incomplete  Blood Culture (routine x 2)     Status: None (Preliminary result)   Collection Time: 01/03/17  8:36 AM  Result Value Ref Range Status   Specimen Description BLOOD BLOOD RIGHT ARM  Final   Special Requests   Final    BOTTLES DRAWN AEROBIC AND ANAEROBIC Blood Culture adequate volume   Culture   Final    NO GROWTH < 24 HOURS Performed at [facility], [address], [place], [place] [address]    Report Status PENDING  Incomplete  MRSA PCR Screening     Status: None   Collection Time: 01/03/17  2:30 PM  Result Value Ref Range Status   MRSA by PCR NEGATIVE NEGATIVE Final    Comment:        The GeneXpert MRSA Assay (FDA approved for NASAL specimens only), is one component of a comprehensive MRSA colonization surveillance program. It is not intended to diagnose MRSA infection nor to guide or monitor treatment for MRSA infections. Performed at [facility], [address] [place], [place] [address]     Coagulation Studies: No results for input(s): LABPROT, INR in the  last 72 hours.  Urinalysis: Recent Labs    01/03/17 0836  COLORURINE AMBER*  LABSPEC 1.011  PHURINE 7.0  GLUCOSEU NEGATIVE  HGBUR SMALL*  BILIRUBINUR NEGATIVE  KETONESUR NEGATIVE  PROTEINUR 100*  NITRITE NEGATIVE  LEUKOCYTESUR NEGATIVE      Imaging: Ct Head Wo Contrast  Result Date: 01/03/2017 CLINICAL DATA:  83 year old female with a history of confusion, hallucinations. Recent viral infection. EXAM: CT HEAD WITHOUT CONTRAST TECHNIQUE: Contiguous axial images were obtained from the base of the skull through the vertex without intravenous contrast. COMPARISON:  CT 09/18/2015, MR 09/19/2015 FINDINGS: Brain: No acute intracranial hemorrhage. No midline shift or mass effect. Gray-white differentiation is maintained. Confluent hypodensity in the periventricular white matter. Encephalomalacia of the right parietal cortex, in the region of known prior infarction. Unremarkable configuration the ventricles. Vascular: Calcifications of the anterior and posterior circulation. Skull: No acute displaced fracture.  No bony lesions. Sinuses/Orbits: Unremarkable Other: None IMPRESSION: Head CT negative for acute abnormality. Encephalomalacia right parietal lobe. Evidence of chronic microvascular ischemic disease and associated intracranial atherosclerosis. Electronically Signed   By: Corrie Mckusick D.O.   On: 01/03/2017 09:51   Dg Chest Port 1 View  Result Date: 01/03/2017 CLINICAL DATA:  Shortness of breath. EXAM: PORTABLE CHEST 1 VIEW COMPARISON:  09/18/2015 FINDINGS: Cardiac enlargement. Status post median sternotomy and CABG procedure. Bilateral pleural effusions are identified right greater than left. Mild diffuse edema. IMPRESSION: 1. Cardiac enlargement and moderate congestive heart failure. Electronically Signed   By: Kerby Moors M.D.   On: 01/03/2017 09:03     Medications:    . amLODipine  5 mg Oral Daily  . aspirin EC  81 mg Oral QHS  . calcium carbonate  1 tablet Oral TID WC  .  ceFEPime (MAXIPIME) IV  500 mg Intravenous Q24H  . cloNIDine  0.1 mg Oral BID  . clopidogrel  75 mg Oral Daily  . docusate sodium  100 mg Oral BID  . folic acid  0.5 mg Oral Daily  . heparin  5,000 Units Subcutaneous Q8H  . hydrALAZINE  25 mg Oral Q8H  . ipratropium-albuterol  3 mL Nebulization QID  . levothyroxine  75 mcg Oral Daily  . metoprolol tartrate  50 mg Oral BID  . nitroGLYCERIN  1 inch Topical Q6H  . pantoprazole (PROTONIX) IV  40 mg  Intravenous Q12H  . pravastatin  40 mg Oral QPM  . valACYclovir  500 mg Oral Daily  . ascorbic acid  500 mg Oral Daily   acetaminophen **OR** acetaminophen, bisacodyl, hydrALAZINE, ondansetron **OR** ondansetron (ZOFRAN) IV, polyvinyl alcohol  Assessment/ Plan:  83 y.o. Caucasian female with end-stage renal disease, history of CABG 2009, history of bilateral knee replacement, aortic valve replacement 2009, diabetes, hypertension, hypothyroidism, peripheral vascular disease, pulmonary hypertension, anxiety Patient is admitted for confusion, hallucinations and altered mental status and is diagnosed with UTI  [address]/MWF  1.  ESRD 2.  Altered mental status 3.  Urinary tract infection 4.  Acute pulmonary edema 5.  Hypertension 6.  Anemia of chronic kidney disease, hemoglobin 10.7 7.  Secondary hyperparathyroidism  2 L of fluid was removed with dialysis yesterday.  However, by clinical exam she still appears to have some pulmonary edema.  Her mental status is not quite back to normal.  Antibiotics are continued.  We will order another short dialysis treatment for more volume removal as tolerated.  Her blood pressure control has improved.  Per nursing report, patient had prolonged postdialysis bleeding from access pressure had to be held for almost 30 minutes.. This might suggest stenosis in the access.  Vascular surgery will be consulted for angiogram on Wednesday.  In the meantime, smaller needles will be used for  dialysis today.  Continue low-dose Procrit with dialysis.  Follow 1200 cc fluid restriction.   LOS: [place] 12/31/20189:47 AM  [place], [place]

## 2017-01-04 NOTE — Progress Notes (Signed)
Post HD assessment  

## 2017-01-04 NOTE — Progress Notes (Signed)
Pre HD  

## 2017-01-04 NOTE — Progress Notes (Addendum)
[place] at [place] NAME: [name]    MR#:  [id number]  DATE OF BIRTH:  10-23-1920  SUBJECTIVE:   Patient doing better this am  Less SOB  REVIEW OF SYSTEMS:    Review of Systems  Constitutional: Negative for fever, chills weight loss HENT: Negative for ear pain, nosebleeds, congestion, facial swelling, rhinorrhea, neck pain, neck stiffness and ear discharge.  Patient hard of hearing  Respiratory: Negative for cough, shortness of breath, wheezing  Cardiovascular: Negative for chest pain, palpitations and leg swelling.  Gastrointestinal: Negative for heartburn, abdominal pain, vomiting, diarrhea or consitpation Genitourinary: Negative for dysuria, urgency, frequency, hematuria Musculoskeletal: Negative for back pain or joint pain Neurological: Negative for dizziness, seizures, syncope, focal weakness,  numbness and headaches.  Hematological: Does not bruise/bleed easily.  Psychiatric/Behavioral: Negative for hallucinations, confusion, dysphoric mood    Tolerating Diet: yes      DRUG ALLERGIES:   Allergies  Allergen Reactions  . Nitrofurantoin Itching  . Captopril Other (See Comments)    REACTION: unspecified  . Enalapril Maleate Cough  . Ramipril Other (See Comments)    REACTION: unspecified  . Sulfa Antibiotics Other (See Comments)    Reaction unknown  . Verapamil Other (See Comments)    REACTION: unspecified    VITALS:  Blood pressure (!) 149/82, pulse 90, temperature 98.2 F (36.8 C), temperature source Oral, resp. rate 16, height 5\' 2"  (1.575 m), weight 42 kg (92 lb 11.2 oz), SpO2 93 %.  PHYSICAL EXAMINATION:  Constitutional: Appears well-developed and well-nourished. No distress. HENT: Normocephalic. Marland Kitchen Oropharynx is clear and moist.  Eyes: Conjunctivae and EOM are normal. Right eye less red as per family PERRLA, no scleral icterus.  Neck: Normal ROM. Neck supple. No JVD. No tracheal deviation. CVS: RRR, S1/S2  +, 3/6 SEM no gallops, no carotid bruit.  Pulmonary: Effort crackles bases Abdominal: Soft. BS +,  no distension, tenderness, rebound or guarding.  Musculoskeletal: Normal range of motion. No edema and no tenderness.  Neuro: Alert. CN 2-12 grossly intact. No focal deficits. Skin: Skin is warm and dry. No rash noted. Psychiatric: Normal mood and affect.      LABORATORY PANEL:   CBC Recent Labs  Lab 01/04/17 0117  WBC 7.4  HGB 10.7*  HCT 33.2*  PLT 131*   ------------------------------------------------------------------------------------------------------------------  Chemistries  Recent Labs  Lab 01/04/17 0117  NA 139  K 3.6  CL 99*  CO2 29  GLUCOSE 117*  BUN 10  CREATININE 2.43*  CALCIUM 8.4*  AST 22  ALT 8*  ALKPHOS 103  BILITOT 1.8*   ------------------------------------------------------------------------------------------------------------------  Cardiac Enzymes Recent Labs  Lab 01/03/17 1416 01/03/17 2020 01/04/17 0117  TROPONINI 0.04* 0.06* 0.07*   ------------------------------------------------------------------------------------------------------------------  RADIOLOGY:  Ct Head Wo Contrast  Result Date: 01/03/2017 CLINICAL DATA:  83 year old female with a history of confusion, hallucinations. Recent viral infection. EXAM: CT HEAD WITHOUT CONTRAST TECHNIQUE: Contiguous axial images were obtained from the base of the skull through the vertex without intravenous contrast. COMPARISON:  CT 09/18/2015, MR 09/19/2015 FINDINGS: Brain: No acute intracranial hemorrhage. No midline shift or mass effect. Gray-white differentiation is maintained. Confluent hypodensity in the periventricular white matter. Encephalomalacia of the right parietal cortex, in the region of known prior infarction. Unremarkable configuration the ventricles. Vascular: Calcifications of the anterior and posterior circulation. Skull: No acute displaced fracture.  No bony lesions.  Sinuses/Orbits: Unremarkable Other: None IMPRESSION: Head CT negative for acute abnormality. Encephalomalacia right parietal lobe. Evidence of chronic  microvascular ischemic disease and associated intracranial atherosclerosis. Electronically Signed   By: Corrie Mckusick D.O.   On: 01/03/2017 09:51   Dg Chest Port 1 View  Result Date: 01/03/2017 CLINICAL DATA:  Shortness of breath. EXAM: PORTABLE CHEST 1 VIEW COMPARISON:  09/18/2015 FINDINGS: Cardiac enlargement. Status post median sternotomy and CABG procedure. Bilateral pleural effusions are identified right greater than left. Mild diffuse edema. IMPRESSION: 1. Cardiac enlargement and moderate congestive heart failure. Electronically Signed   By: Kerby Moors M.D.   On: 01/03/2017 09:03     ASSESSMENT AND PLAN:   83 year old female with end-stage renal disease on hemodialysis who is recently prescribed Valtrex for right eye shingles who presents with shortness of breath and hallucinations.  1. Acute metabolic encephalopathy in the setting of UTI and perhaps Valtrex Continue to monitor  2. End-stage renal disease on hemodialysis with acute pulmonary edema which is improved: Patient will have dialysis today as scheduled Monday, Wednesday and Friday  3. Recent diagnosis of herpes simplex right eye: I am calling ophthalmology to see if I may discontinue Valtrex as this is causing hallucinations   4.Acccelerated essential hypertension due to pulmonary edema Continue clonidine, hydralazine, metoprolol, Norvasc   5. Hypothyroid: Continue Synthroid   6. Hyperlipidemia: Continue statin   7. GERD on PPI   8. Chronic diastolic heart failure: Cardiology consultation appreciated Pulmonary edema was due to end-stage renal disease.  9. Elevated troponin: Patient is ruled out for ACS. Elevation in troponin is due to demand ischemia and poor renal clearance.  Management plans discussed with the patient and son and they are  agreement.  CODE  STATUS: DNR  TOTAL TIME TAKING CARE OF THIS PATIENT: 30 minutes.   PT consultation for discharge planning.  POSSIBLE D/C tomorrow, DEPENDING ON CLINICAL CONDITION.   [name] M.D on 01/04/2017 at 10:35 AM  Between 7am to 6pm - Pager - [phone] After 6pm go to [email or web address] - password [email or web address] [facility]  Office  [phone]  CC: Primary care physician; [name], MD  Note: This dictation was prepared with Dragon dictation along with smaller phrase technology. Any transcriptional errors that result from this process are unintentional.

## 2017-01-04 NOTE — Care Management (Signed)
Patient off the floor for HD.  [name] HD liaison notified of admission. Patient off the floor for HD. Unable to complete assessment. If RNCM needed please place consult

## 2017-01-04 NOTE — Progress Notes (Signed)
PT Cancellation Note  Patient Details Name: KHARA RENAUD MRN: [id number] DOB: December 14, 1920   Cancelled Treatment:    Reason Eval/Treat Not Completed: Patient at procedure or test/unavailable(Evaluation re-attempted.  Patient now off unit for dialysis.  Will re-attempt at later time/date as medically appropriate and available.)   Deren Degrazia H. Owens Shark, PT, DPT, NCS 01/04/17, 2:18 PM [phone]

## 2017-01-05 ENCOUNTER — Inpatient Hospital Stay (HOSPITAL_COMMUNITY)
Admit: 2017-01-05 | Discharge: 2017-01-05 | Disposition: A | Discharge status: Home / Self Care | Payer: Medicare Other | Attending: Internal Medicine | Admitting: Internal Medicine

## 2017-01-05 DIAGNOSIS — J810 Acute pulmonary edema: Secondary | ICD-10-CM

## 2017-01-05 LAB — URINE CULTURE

## 2017-01-05 LAB — BASIC METABOLIC PANEL
Anion gap: 11 (ref 5–15)
BUN: 9 mg/dL (ref 6–20)
CALCIUM: 8.8 mg/dL — AB (ref 8.9–10.3)
CO2: 28 mmol/L (ref 22–32)
Chloride: 99 mmol/L — ABNORMAL LOW (ref 101–111)
Creatinine, Ser: 2.16 mg/dL — ABNORMAL HIGH (ref 0.44–1.00)
GFR calc Af Amer: 21 mL/min — ABNORMAL LOW (ref >60)
GFR calc non Af Amer: 18 mL/min — ABNORMAL LOW (ref >60)
GLUCOSE: 81 mg/dL (ref 65–99)
POTASSIUM: 3.8 mmol/L (ref 3.5–5.1)
Sodium: 138 mmol/L (ref 135–145)

## 2017-01-05 LAB — HEPATITIS B SURFACE ANTIGEN: HEP B S AG: NEGATIVE

## 2017-01-05 LAB — ECHOCARDIOGRAM COMPLETE
HEIGHTINCHES: 62 in
WEIGHTICAEL: 1530.87 [oz_av]

## 2017-01-05 LAB — HEPATITIS B CORE ANTIBODY, TOTAL: Hep B Core Total Ab: NEGATIVE

## 2017-01-05 LAB — HEPATITIS B SURFACE ANTIBODY,QUALITATIVE: HEP B S AB: REACTIVE

## 2017-01-05 MED ORDER — IPRATROPIUM-ALBUTEROL 0.5-2.5 (3) MG/3ML IN SOLN
3.0000 mL | Freq: Two times a day (BID) | RESPIRATORY_TRACT | Status: DC
  Administered 2017-01-05 – 2017-01-08 (×6): 3 mL via RESPIRATORY_TRACT
  Filled 2017-01-05 (×4): qty 3
  Filled 2017-01-05: qty 39
  Filled 2017-01-05: qty 3

## 2017-01-05 NOTE — Progress Notes (Signed)
I spoke with the son tonight about proceeding with a left arm fistulogram.  He told me that Dr. Augustin Coupe has previously placed a stent within her fistula and she has undergone angioplasty of in-stent stenosis, 4 months ago.  The patient is concerned about the access needle and local.  She has previously gotten mild sedation which has helped.  She will be n.p.o. after midnight with plans for procedure tomorrow  Annamarie Major

## 2017-01-05 NOTE — Progress Notes (Addendum)
[place] at [place] NAME: [name]    MR#:  [id number]  DATE OF BIRTH:  January 18, 1920  SUBJECTIVE:   No SOB this am  Son at bedside Confusion improved  REVIEW OF SYSTEMS:    Review of Systems  Constitutional: Negative for fever, chills weight loss HENT: Negative for ear pain, nosebleeds, congestion, facial swelling, rhinorrhea, neck pain, neck stiffness and ear discharge.  Patient hard of hearing  Respiratory: Negative for cough, shortness of breath, wheezing  Cardiovascular: Negative for chest pain, palpitations and leg swelling.  Gastrointestinal: Negative for heartburn, abdominal pain, vomiting, diarrhea or consitpation Genitourinary: Negative for dysuria, urgency, frequency, hematuria Musculoskeletal: Negative for back pain or joint pain Neurological: Negative for dizziness, seizures, syncope, focal weakness,  numbness and headaches.  Hematological: Does not bruise/bleed easily.  Psychiatric/Behavioral: Negative for hallucinations, confusion, dysphoric mood    Tolerating Diet: yes      DRUG ALLERGIES:   Allergies  Allergen Reactions  . Nitrofurantoin Itching  . Captopril Other (See Comments)    REACTION: unspecified  . Enalapril Maleate Cough  . Ramipril Other (See Comments)    REACTION: unspecified  . Sulfa Antibiotics Other (See Comments)    Reaction unknown  . Verapamil Other (See Comments)    REACTION: unspecified    VITALS:  Blood pressure (!) 159/53, pulse 93, temperature 98.1 F (36.7 C), temperature source Oral, resp. rate 16, height 5\' 2"  (1.575 m), weight 43.4 kg (95 lb 10.9 oz), SpO2 95 %.  PHYSICAL EXAMINATION:  Constitutional: Appears frail No distress. HENT: Normocephalic. Marland Kitchen Oropharynx is clear and moist.  Eyes: Conjunctivae and EOM are normal. Right eye less red as per family PERRLA, no scleral icterus.  Neck: Normal ROM. Neck supple. No JVD. No tracheal deviation. CVS: RRR, S1/S2 +, 3/6 SEM, no  gallops, no carotid bruit.  Pulmonary: crackles at bases Abdominal: Soft. BS +,  no distension, tenderness, rebound or guarding.  Musculoskeletal: Normal range of motion. No edema and no tenderness.  Neuro: Alert. CN 2-12 grossly intact. No focal deficits. Skin: Skin is warm and dry. No rash noted. Psychiatric: Normal mood and affect.      LABORATORY PANEL:   CBC Recent Labs  Lab 01/04/17 0117  WBC 7.4  HGB 10.7*  HCT 33.2*  PLT 131*   ------------------------------------------------------------------------------------------------------------------  Chemistries  Recent Labs  Lab 01/04/17 0117 01/05/17 0551  NA 139 138  K 3.6 3.8  CL 99* 99*  CO2 29 28  GLUCOSE 117* 81  BUN 10 9  CREATININE 2.43* 2.16*  CALCIUM 8.4* 8.8*  AST 22  --   ALT 8*  --   ALKPHOS 103  --   BILITOT 1.8*  --    ------------------------------------------------------------------------------------------------------------------  Cardiac Enzymes Recent Labs  Lab 01/03/17 1416 01/03/17 2020 01/04/17 0117  TROPONINI 0.04* 0.06* 0.07*   ------------------------------------------------------------------------------------------------------------------  RADIOLOGY:  Ct Head Wo Contrast  Result Date: 01/03/2017 CLINICAL DATA:  83 year old female with a history of confusion, hallucinations. Recent viral infection. EXAM: CT HEAD WITHOUT CONTRAST TECHNIQUE: Contiguous axial images were obtained from the base of the skull through the vertex without intravenous contrast. COMPARISON:  CT 09/18/2015, MR 09/19/2015 FINDINGS: Brain: No acute intracranial hemorrhage. No midline shift or mass effect. Gray-white differentiation is maintained. Confluent hypodensity in the periventricular white matter. Encephalomalacia of the right parietal cortex, in the region of known prior infarction. Unremarkable configuration the ventricles. Vascular: Calcifications of the anterior and posterior circulation. Skull: No acute  displaced fracture.  No bony lesions. Sinuses/Orbits: Unremarkable Other: None IMPRESSION: Head CT negative for acute abnormality. Encephalomalacia right parietal lobe. Evidence of chronic microvascular ischemic disease and associated intracranial atherosclerosis. Electronically Signed   By: Corrie Mckusick D.O.   On: 01/03/2017 09:51     ASSESSMENT AND PLAN:   84 year old female with end-stage renal disease on hemodialysis who is recently prescribed Valtrex for right eye shingles who presents with shortness of breath and hallucinations.  1. Acute metabolic encephalopathy in the setting of UTI and Valtrex This has improved  2. End-stage renal disease on hemodialysis with acute pulmonary edema which is improved: Patient will continue dialysis  Monday, Wednesday and Friday She will undergo fistulogram tomorrow due to concerns of bleeding from fistula site.  3. Recent diagnosis of herpes simplex right eye: I spoke with her OPTHOMOLOGIST who RECS changing Valtrex to GENT eye drops given concern for hallucinations and confusion  4.Acccelerated essential hypertension due to pulmonary edema..improved BP Continue clonidine, hydralazine, metoprolol, Norvasc   5. Hypothyroid: Continue Synthroid   6. Hyperlipidemia: Continue statin   7. GERD on PPI   8. Chronic diastolic heart failure: Cardiology consultation appreciated Pulmonary edema was due to end-stage renal disease.  9. Elevated troponin: Patient is ruled out for ACS. Elevation in troponin is due to demand ischemia and poor renal clearance.  Management plans discussed with the patient and son and they are  agreement.  CODE STATUS: DNR  TOTAL TIME TAKING CARE OF THIS PATIENT: 24 minutes.   PT RECS SNF  POSSIBLE D/C tomorrow to SNF DEPENDING ON CLINICAL CONDITION.   Stephen Turnbaugh M.D on 01/05/2017 at 9:13 AM  Between 7am to 6pm - Pager - [phone] After 6pm go to [email or web address] - password [email or web address] [facility]   Office  [phone]  CC: Primary care physician; [name], MD  Note: This dictation was prepared with Dragon dictation along with smaller phrase technology. Any transcriptional errors that result from this process are unintentional.

## 2017-01-05 NOTE — Consult Note (Signed)
[facility]  Patient name: [name] MRN: [id number] DOB: June 14, 1920 Sex: female   REQUESTING PROVIDER:   Renal   REASON FOR CONSULT:    Prolonged bleeding after dialysis  HISTORY OF PRESENT ILLNESS:   [name] is a 84 y.o. female, who was admitted for a 2-week history of shortness of breath.  She has undergone several sessions of dialysis with prolonged bleeding from her fistula.  Fistulogram has been recommended.  The patient has a left brachiocephalic fistula which was created many years ago.    PAST MEDICAL HISTORY    Past Medical History:  Diagnosis Date  . Anemia    NOS  . Anxiety   . Aortic stenosis   . Cancer [facility])    Mole on top of head to be removed in July 2013  . CHF (congestive heart failure) ([place])   . Constipation   . Coronary artery disease   . Diabetes mellitus    type II;was on Glipizide but has been off x 98mon  . Dialysis patient ([place])   . Diarrhea   . GERD (gastroesophageal reflux disease)   . History of blood transfusion   . Hyperlipidemia    takes Simvastatin nightly  . Hyperlipidemia   . Hypertension    takes Metoprolol daily  . Hypothyroidism    takes Synthroid daily  . Migraine    hx of  . Oligouria   . Osteoarthritis   . Osteopenia   . Peripheral vascular disease ([place])   . Personal history of colonic polyps   . Pulmonary hypertension ([place])   . Renal insufficiency   . Urinary incontinence   . UTI (urinary tract infection)      FAMILY HISTORY   Family History  Family history unknown: Yes    SOCIAL HISTORY:   Social History   Socioeconomic History  . Marital status: Widowed    Spouse name: Not on file  . Number of children: 1  . Years of education: Not on file  . Highest education level: Not on file  Social Needs  . Financial resource strain: Not on file  . Food insecurity - worry: Not on file  . Food insecurity - inability: Not on file  .  Transportation needs - medical: Not on file  . Transportation needs - non-medical: Not on file  Occupational History  . Occupation: Retired Armed forces training and education officer  Tobacco Use  . Smoking status: Former Smoker    Years: 15.00    Types: Cigarettes    Last attempt to quit: 01/06/1943    Years since quitting: 74.0  . Smokeless tobacco: Never Used  Substance and Sexual Activity  . Alcohol use: Yes    Comment: Wine occasionally  . Drug use: No  . Sexual activity: No  Other Topics Concern  . Not on file  Social History Narrative   Son Evlyn Clines to make decisions for her   Discussed DNR and she requests --done 05/03/14   No tube feeds if cognitively unaware    ALLERGIES:    Allergies  Allergen Reactions  . Nitrofurantoin Itching  . Captopril Other (See Comments)    REACTION: unspecified  . Enalapril Maleate Cough  . Ramipril Other (See Comments)    REACTION: unspecified  . Sulfa Antibiotics Other (See Comments)    Reaction unknown  . Verapamil Other (See Comments)    REACTION: unspecified    CURRENT MEDICATIONS:    Current Facility-Administered Medications  Medication Dose  Route Frequency Provider Last Rate Last Dose  . acetaminophen (TYLENOL) tablet 650 mg  650 mg Oral Q6H PRN Idelle Crouch, MD       Or  . acetaminophen (TYLENOL) suppository 650 mg  650 mg Rectal Q6H PRN Idelle Crouch, MD      . amLODipine (NORVASC) tablet 5 mg  5 mg Oral Daily Idelle Crouch, MD   5 mg at 01/04/17 1836  . aspirin EC tablet 81 mg  81 mg Oral QHS Idelle Crouch, MD   81 mg at 01/04/17 2140  . bisacodyl (DULCOLAX) suppository 10 mg  10 mg Rectal Daily PRN Idelle Crouch, MD      . calcium carbonate (TUMS - dosed in mg elemental calcium) chewable tablet 200 mg of elemental calcium  1 tablet Oral TID WC Idelle Crouch, MD   200 mg of elemental calcium at 01/04/17 1300  . ceFEPIme (MAXIPIME) 500 mg in dextrose 5 % 50 mL IVPB  500 mg Intravenous Q24H Bettey Costa, MD    500 mg at 01/04/17 1836  . cloNIDine (CATAPRES) tablet 0.1 mg  0.1 mg Oral BID Idelle Crouch, MD   0.1 mg at 01/04/17 2140  . clopidogrel (PLAVIX) tablet 75 mg  75 mg Oral Daily Idelle Crouch, MD   75 mg at 01/04/17 1835  . docusate sodium (COLACE) capsule 100 mg  100 mg Oral BID Idelle Crouch, MD   100 mg at 01/04/17 2140  . [START ON 01/06/2017] epoetin alfa (EPOGEN,PROCRIT) injection 4,000 Units  4,000 Units Intravenous Q M,W,F-HD Candiss Norse, Harmeet, MD      . folic acid (FOLVITE) tablet 0.5 mg  0.5 mg Oral Daily Idelle Crouch, MD   0.5 mg at 01/04/17 1836  . Ganciclovir (ZIRGAN) 0.15 % ophthalmic gel 1 drop  1 drop Right Eye QID Bettey Costa, MD   1 drop at 01/04/17 2141  . heparin injection 5,000 Units  5,000 Units Subcutaneous Q8H Idelle Crouch, MD   5,000 Units at 01/04/17 2141  . hydrALAZINE (APRESOLINE) injection 10 mg  10 mg Intravenous Q4H PRN Idelle Crouch, MD   10 mg at 01/03/17 1224  . hydrALAZINE (APRESOLINE) tablet 25 mg  25 mg Oral Q8H Idelle Crouch, MD   25 mg at 01/04/17 2141  . ipratropium-albuterol (DUONEB) 0.5-2.5 (3) MG/3ML nebulizer solution 3 mL  3 mL Nebulization QID Idelle Crouch, MD   3 mL at 01/04/17 1948  . levothyroxine (SYNTHROID, LEVOTHROID) tablet 75 mcg  75 mcg Oral Daily Idelle Crouch, MD   75 mcg at 01/04/17 0501  . metoprolol tartrate (LOPRESSOR) tablet 50 mg  50 mg Oral BID Idelle Crouch, MD   50 mg at 01/04/17 2140  . nitroGLYCERIN (NITROGLYN) 2 % ointment 1 inch  1 inch Topical Q6H Idelle Crouch, MD   1 inch at 01/04/17 2328  . ondansetron (ZOFRAN) tablet 4 mg  4 mg Oral Q6H PRN Idelle Crouch, MD       Or  . ondansetron (ZOFRAN) injection 4 mg  4 mg Intravenous Q6H PRN Idelle Crouch, MD      . pantoprazole (PROTONIX) injection 40 mg  40 mg Intravenous Q12H Idelle Crouch, MD   40 mg at 01/04/17 2140  . polyvinyl alcohol (LIQUIFILM TEARS) 1.4 % ophthalmic solution 1 drop  1 drop Both Eyes PRN Idelle Crouch,  MD      . pravastatin (PRAVACHOL) tablet 40  mg  40 mg Oral QPM Idelle Crouch, MD   40 mg at 01/04/17 1836  . vitamin C (ASCORBIC ACID) tablet 500 mg  500 mg Oral Daily Sparks, Leonie Douglas, MD        REVIEW OF SYSTEMS:   [X]  denotes positive finding, [ ]  denotes negative finding Cardiac  Comments:  Chest pain or chest pressure:    Shortness of breath upon exertion:    Short of breath when lying flat:    Irregular heart rhythm:        Vascular    Pain in calf, thigh, or hip brought on by ambulation:    Pain in feet at night that wakes you up from your sleep:     Blood clot in your veins:    Leg swelling:         Pulmonary    Oxygen at home:    Productive cough:     Wheezing:         Neurologic    Sudden weakness in arms or legs:     Sudden numbness in arms or legs:     Sudden onset of difficulty speaking or slurred speech:    Temporary loss of vision in one eye:     Problems with dizziness:         Gastrointestinal    Blood in stool:      Vomited blood:         Genitourinary    Burning when urinating:     Blood in urine:        Psychiatric    Major depression:         Hematologic    Bleeding problems:    Problems with blood clotting too easily:        Skin    Rashes or ulcers:        Constitutional    Fever or chills:     PHYSICAL EXAM:   Vitals:   01/04/17 1815 01/04/17 1828 01/04/17 1937 01/04/17 1950  BP:  (!) 180/59 (!) 171/62   Pulse: 95 96 94   Resp: 18 12 16    Temp:  97.7 F (36.5 C) 98.4 F (36.9 C)   TempSrc:  Oral Oral   SpO2: 98% 100% 99% 96%  Weight:      Height:        GENERAL: The patient is a well-nourished female, in no acute distress. The vital signs are documented above. CARDIAC: There is a regular rate and rhythm.  VASCULAR: Palpable thrill within fistula.  It is not pulsatile PULMONARY: Nonlabored respirations ABDOMEN: Soft and non-tender with normal pitched bowel sounds.  MUSCULOSKELETAL: There are no major deformities or  cyanosis. NEUROLOGIC: No focal weakness or paresthesias are detected. SKIN: There are no ulcers or rashes noted. PSYCHIATRIC: The patient has a normal affect.  STUDIES:    None  ASSESSMENT and PLAN   Prolonged bleeding after dialysis: The patient will be scheduled for a fistulogram on Wednesday, January 2.  She will be n.p.o. after midnight.   Annamarie Major, MD [facility] [phone] Pager [phone]

## 2017-01-05 NOTE — Progress Notes (Signed)
PT Cancellation Note  Patient Details Name: [name] MRN: [id number] DOB: 1920-10-16   Cancelled Treatment:    Reason Eval/Treat Not Completed: Medical issues which prohibited therapy; Troponin is trending up and CI.    [address] [place], [place] DPT 01/05/2017, 12:22 PM

## 2017-01-05 NOTE — Progress Notes (Signed)
[facility], [place] 01/05/17  Subjective:   Patient is clinically doing much better today.  Her son reports that she was able to feed herself a little breakfast this morning.  A total of 4 L of fluid has been removed with dialysis over the last 2 sessions so far.  Due to prolonged access bleeding, vascular consult was obtained.  She is scheduled for an angiogram tomorrow.   Objective:  Vital signs in last 24 hours:  Temp:  [97.4 F (36.3 C)-98.4 F (36.9 C)] 97.9 F (36.6 C) (01/01 1230) Pulse Rate:  [75-101] 75 (01/01 1230) Resp:  [12-31] 18 (01/01 1230) BP: (136-180)/(38-73) 136/38 (01/01 1230) SpO2:  [92 %-100 %] 92 % (01/01 1230) Weight:  [42.4 kg (93 lb 7.6 oz)-43.4 kg (95 lb 10.9 oz)] 43.4 kg (95 lb 10.9 oz) (01/01 0500)  Weight change: 3.048 kg (6 lb 11.5 oz) Filed Weights   01/04/17 1405 01/04/17 1735 01/05/17 0500  Weight: 47.5 kg (104 lb 11.5 oz) 42.4 kg (93 lb 7.6 oz) 43.4 kg (95 lb 10.9 oz)    Intake/Output:    Intake/Output Summary (Last 24 hours) at 01/05/2017 1427 Last data filed at 01/05/2017 1414 Gross per 24 hour  Intake 240 ml  Output 2015 ml  Net -1775 ml     Physical Exam: General:  Frail, elderly, laying in the bed  HEENT  dry oral mucous membranes, decreased hearing  Neck  supple  Pulm/lungs  clear to auscultation today  CVS/Heart  tachycardic, irregular  Abdomen:   Soft, nontender  Extremities:  Trace pitting edema  Neurologic:  able to follow simple commands  Skin:  Scattered ecchymosis  Access:  AV fistula       Basic Metabolic Panel:  Recent Labs  Lab 01/03/17 0835 01/03/17 1416 01/03/17 2020 01/04/17 0117 01/05/17 0551  NA 137  --   --  139 138  K 4.2  --   --  3.6 3.8  CL 96*  --   --  99* 99*  CO2 25  --   --  29 28  GLUCOSE 110*  --   --  117* 81  BUN 24*  --   --  10 9  CREATININE 3.94* 4.33*  --  2.43* 2.16*  CALCIUM 9.3  --   --  8.4* 8.8*  PHOS  --   --  2.2*  --   --      CBC: Recent  Labs  Lab 01/03/17 0835 01/03/17 1416 01/04/17 0117  WBC 7.7 8.5 7.4  NEUTROABS 6.4  --   --   HGB 13.2 12.7 10.7*  HCT 40.6 39.0 33.2*  MCV 97.7 98.0 97.5  PLT 144* 155 131*      Lab Results  Component Value Date   HEPBSAG Negative 01/03/2017   HEPBSAB Reactive 01/03/2017   HEPBIGM NEGATIVE 11/23/2010      Microbiology:  Recent Results (from the past 240 hour(s))  Blood Culture (routine x 2)     Status: None (Preliminary result)   Collection Time: 01/03/17  8:36 AM  Result Value Ref Range Status   Specimen Description BLOOD BLOOD RIGHT HAND  Final   Special Requests   Final    BOTTLES DRAWN AEROBIC AND ANAEROBIC Blood Culture results may not be optimal due to an inadequate volume of blood received in culture bottles   Culture   Final    NO GROWTH 2 DAYS Performed at [facility], [name] [place],  [place] [address]    Report Status PENDING  Incomplete  Blood Culture (routine x 2)     Status: None (Preliminary result)   Collection Time: 01/03/17  8:36 AM  Result Value Ref Range Status   Specimen Description BLOOD BLOOD RIGHT ARM  Final   Special Requests   Final    BOTTLES DRAWN AEROBIC AND ANAEROBIC Blood Culture adequate volume   Culture   Final    NO GROWTH 2 DAYS Performed at [facility], [address], [place], [place] [address]    Report Status PENDING  Incomplete  Urine culture     Status: Abnormal   Collection Time: 01/03/17  8:36 AM  Result Value Ref Range Status   Specimen Description   Final    URINE, RANDOM Performed at [facility], [address] [place], [place] [address]    Special Requests   Final    NONE Performed at [facility], [address] [place], [place] [address]    Culture >=100,000 COLONIES/mL ESCHERICHIA COLI (A)  Final   Report Status 01/05/2017 FINAL  Final   Organism ID, Bacteria ESCHERICHIA COLI (A)  Final      Susceptibility   Escherichia coli - MIC*    AMPICILLIN >=32  RESISTANT Resistant     CEFAZOLIN 16 SENSITIVE Sensitive     CEFTRIAXONE <=1 SENSITIVE Sensitive     CIPROFLOXACIN >=4 RESISTANT Resistant     GENTAMICIN >=16 RESISTANT Resistant     IMIPENEM <=0.25 SENSITIVE Sensitive     NITROFURANTOIN 64 INTERMEDIATE Intermediate     TRIMETH/SULFA <=20 SENSITIVE Sensitive     AMPICILLIN/SULBACTAM >=32 RESISTANT Resistant     PIP/TAZO <=4 SENSITIVE Sensitive     Extended ESBL NEGATIVE Sensitive     * >=100,000 COLONIES/mL ESCHERICHIA COLI  MRSA PCR Screening     Status: None   Collection Time: 01/03/17  2:30 PM  Result Value Ref Range Status   MRSA by PCR NEGATIVE NEGATIVE Final    Comment:        The GeneXpert MRSA Assay (FDA approved for NASAL specimens only), is one component of a comprehensive MRSA colonization surveillance program. It is not intended to diagnose MRSA infection nor to guide or monitor treatment for MRSA infections. Performed at [facility], [address] [place], [place] [address]     Coagulation Studies: No results for input(s): LABPROT, INR in the last 72 hours.  Urinalysis: Recent Labs    01/03/17 0836  COLORURINE AMBER*  LABSPEC 1.011  PHURINE 7.0  GLUCOSEU NEGATIVE  HGBUR SMALL*  BILIRUBINUR NEGATIVE  KETONESUR NEGATIVE  PROTEINUR 100*  NITRITE NEGATIVE  LEUKOCYTESUR NEGATIVE      Imaging: No results found.   Medications:    . amLODipine  5 mg Oral Daily  . aspirin EC  81 mg Oral QHS  . calcium carbonate  1 tablet Oral TID WC  . ceFEPime (MAXIPIME) IV  500 mg Intravenous Q24H  . cloNIDine  0.1 mg Oral BID  . clopidogrel  75 mg Oral Daily  . docusate sodium  100 mg Oral BID  . [START ON 01/06/2017] epoetin (EPOGEN/PROCRIT) injection  4,000 Units Intravenous Q M,W,F-HD  . folic acid  0.5 mg Oral Daily  . Ganciclovir  1 drop Right Eye QID  . heparin  5,000 Units Subcutaneous Q8H  . hydrALAZINE  25 mg Oral Q8H  . ipratropium-albuterol  3 mL Nebulization BID  . levothyroxine  75  mcg Oral Daily  .  metoprolol tartrate  50 mg Oral BID  . nitroGLYCERIN  1 inch Topical Q6H  . pantoprazole (PROTONIX) IV  40 mg Intravenous Q12H  . pravastatin  40 mg Oral QPM  . ascorbic acid  500 mg Oral Daily   acetaminophen **OR** acetaminophen, bisacodyl, hydrALAZINE, ondansetron **OR** ondansetron (ZOFRAN) IV, polyvinyl alcohol  Assessment/ Plan:  84 y.o. Caucasian female with end-stage renal disease, history of CABG 2009, history of bilateral knee replacement, aortic valve replacement 2009, diabetes, hypertension, hypothyroidism, peripheral vascular disease, pulmonary hypertension, anxiety Patient is admitted for confusion, hallucinations and altered mental status and is diagnosed with UTI  [address] neph[facility]/MWF  1.  ESRD 2.  Altered mental status (patient was living independently and was able to drive prior to admission ) 3.  Urinary tract infection 4.  Acute pulmonary edema 5.  Hypertension 6.  Anemia of chronic kidney disease, hemoglobin 10.7 7.  Secondary hyperparathyroidism  Another 2 L of fluid was removed with dialysis yesterday.  Her blood pressure and breathing status have improved.  Per nursing report, patient had prolonged postdialysis bleeding from access pressure had to be held for almost 30 minutes. Vascular surgery is planning and angiogram on Wednesday.  Plan for dialysis after the angiogram.  Continue low-dose Procrit with dialysis.  Follow 1200 cc fluid restriction.   LOS: [place] 1/1/20192:27 PM  [place], [place]

## 2017-01-06 ENCOUNTER — Encounter: Payer: Self-pay | Admitting: *Deleted

## 2017-01-06 MED ORDER — VITAMIN C 500 MG PO TABS
250.0000 mg | ORAL_TABLET | Freq: Two times a day (BID) | ORAL | Status: DC
  Administered 2017-01-06 – 2017-01-07 (×2): 250 mg via ORAL
  Filled 2017-01-06 (×6): qty 0.5

## 2017-01-06 MED ORDER — DEXTROSE 5 % IV SOLN
1.5000 g | INTRAVENOUS | Status: DC
  Administered 2017-01-07: 1.5 g via INTRAVENOUS
  Filled 2017-01-06: qty 1.5

## 2017-01-06 MED ORDER — RENA-VITE PO TABS
1.0000 | ORAL_TABLET | Freq: Every day | ORAL | Status: DC
  Administered 2017-01-06 – 2017-01-07 (×2): 1 via ORAL
  Filled 2017-01-06 (×2): qty 1

## 2017-01-06 MED ORDER — CEFUROXIME AXETIL 250 MG PO TABS
250.0000 mg | ORAL_TABLET | ORAL | Status: DC
  Administered 2017-01-06 – 2017-01-08 (×2): 250 mg via ORAL
  Filled 2017-01-06 (×2): qty 1

## 2017-01-06 MED ORDER — ENSURE ENLIVE PO LIQD
237.0000 mL | Freq: Three times a day (TID) | ORAL | Status: DC
  Administered 2017-01-06 – 2017-01-07 (×2): 237 mL via ORAL

## 2017-01-06 MED ORDER — CEFUROXIME AXETIL 250 MG PO TABS: 250.0000 mg | ORAL_TABLET | Freq: Two times a day (BID) | ORAL | Status: DC

## 2017-01-06 NOTE — Progress Notes (Signed)
[facility] [place] NAME: [name]    MR#:  [id number]  DATE OF BIRTH:  01/06/1920  SUBJECTIVE:   No acute events overnight. Son is at bedside.  REVIEW OF SYSTEMS:    Review of Systems  Constitutional: Negative for fever, chills weight loss HENT: Negative for ear pain, nosebleeds, congestion, facial swelling, rhinorrhea, neck pain, neck stiffness and ear discharge.  Patient hard of hearing  Respiratory: Negative for cough, shortness of breath, wheezing  Cardiovascular: Negative for chest pain, palpitations and leg swelling.  Gastrointestinal: Negative for heartburn, abdominal pain, vomiting, diarrhea or consitpation Genitourinary: Negative for dysuria, urgency, frequency, hematuria Musculoskeletal: Negative for back pain or joint pain Neurological: Negative for dizziness, seizures, syncope, focal weakness,  numbness and headaches.  Hematological: Does not bruise/bleed easily.  Psychiatric/Behavioral: Negative for hallucinations, confusion, dysphoric mood    Tolerating Diet: yes      DRUG ALLERGIES:   Allergies  Allergen Reactions  . Nitrofurantoin Itching  . Captopril Other (See Comments)    REACTION: unspecified  . Enalapril Maleate Cough  . Ramipril Other (See Comments)    REACTION: unspecified  . Sulfa Antibiotics Other (See Comments)    Reaction unknown  . Verapamil Other (See Comments)    REACTION: unspecified    VITALS:  Blood pressure (!) 151/39, pulse 77, temperature 98 F (36.7 C), temperature source Oral, resp. rate 20, height 5\' 2"  (1.575 m), weight 42 kg (92 lb 8 oz), SpO2 91 %.  PHYSICAL EXAMINATION:  Constitutional: Appears frail No distress. HENT: Normocephalic. Marland Kitchen Oropharynx is clear and moist.  Eyes: Conjunctivae and EOM are normal. Right eye less red as per family PERRLA, no scleral icterus.  Neck: Normal ROM. Neck supple. No JVD. No tracheal deviation. CVS: RRR, S1/S2 +, 3/6 SEM, no gallops, no  carotid bruit.  Pulmonary: crackles at bases Abdominal: Soft. BS +,  no distension, tenderness, rebound or guarding.  Musculoskeletal: Normal range of motion. No edema and no tenderness.  Neuro: Alert. CN 2-12 grossly intact. No focal deficits. Skin: Skin is warm and dry. No rash noted. Psychiatric: Normal mood and affect.      LABORATORY PANEL:   CBC Recent Labs  Lab 01/04/17 0117  WBC 7.4  HGB 10.7*  HCT 33.2*  PLT 131*   ------------------------------------------------------------------------------------------------------------------  Chemistries  Recent Labs  Lab 01/04/17 0117 01/05/17 0551  NA 139 138  K 3.6 3.8  CL 99* 99*  CO2 29 28  GLUCOSE 117* 81  BUN 10 9  CREATININE 2.43* 2.16*  CALCIUM 8.4* 8.8*  AST 22  --   ALT 8*  --   ALKPHOS 103  --   BILITOT 1.8*  --    ------------------------------------------------------------------------------------------------------------------  Cardiac Enzymes Recent Labs  Lab 01/03/17 1416 01/03/17 2020 01/04/17 0117  TROPONINI 0.04* 0.06* 0.07*   ------------------------------------------------------------------------------------------------------------------  RADIOLOGY:  No results found.   ASSESSMENT AND PLAN:   84 year old female with end-stage renal disease on hemodialysis who is recently prescribed Valtrex for right eye shingles who presents with shortness of breath and hallucinations.  1. Acute metabolic encephalopathy in the setting of Escherichia coli UTI and Valtrex This has improved I will change antibiotics to oral Ceftin  2. End-stage renal disease on hemodialysis with acute pulmonary edema which is improved: Patient will continue dialysis  Monday, Wednesday and Friday She will undergo fistulogram tomorrow due to concerns of bleeding from fistula site.(cancelled today)  3. Recent diagnosis of herpes simplex right eye: I spoke  with her OPTHOMOLOGIST who RECS changing Valtrex to GENT eye drops  given concern for hallucinations and confusion She will need to follow-up after discharge with her ophthalmologist.  4.Acccelerated essential hypertension due to pulmonary edema..improved BP Continue clonidine, hydralazine, metoprolol, Norvasc   5. Hypothyroid: Continue Synthroid   6. Hyperlipidemia: Continue statin   7. GERD on PPI   8. Chronic diastolic heart failure with preserved ejection fraction and moderate to severe tricuspid regurgitation: Cardiology consultation appreciated Pulmonary edema was due to end-stage renal disease. Patient will need a follow-up with cardiology after discharge  9. Elevated troponin: Patient is ruled out for ACS. Elevation in troponin is due to demand ischemia and poor renal clearance.  Management plans discussed with the patient and son and they are  agreement.  CODE STATUS: DNR  TOTAL TIME TAKING CARE OF THIS PATIENT: 24 minutes.  Discussed with Dr. Holley Raring  PT RECS SNF  POSSIBLE D/C tomorrow to SNF DEPENDING ON CLINICAL CONDITION.   Sriman Tally M.D on 01/06/2017 at 9:55 AM  Between 7am to 6pm - Pager - [phone] After 6pm go to [email or web address] - password [email or web address] [facility]  Office  [phone]  CC: Primary care physician; [name], MD  Note: This dictation was prepared with Dragon dictation along with smaller phrase technology. Any transcriptional errors that result from this process are unintentional.

## 2017-01-06 NOTE — Progress Notes (Signed)
Prolonged bleeding in venous access site. Bleeding stopped at 2002, transport pending.

## 2017-01-06 NOTE — Progress Notes (Signed)
PT Cancellation Note  Patient Details Name: [name] MRN: [id number] DOB: 1920-07-12   Cancelled Treatment:    Reason Eval/Treat Not Completed: Medical issues which prohibited therapy   Alanson Puls, PT DPT 01/06/2017, 9:15 AM

## 2017-01-06 NOTE — Care Management Important Message (Signed)
Important Message  Patient Details  Name: [name] MRN: [id number] Date of Birth: 10/11/1920   Medicare Important Message Given:  Yes    Beverly Sessions, RN 01/06/2017, 2:13 PM

## 2017-01-06 NOTE — Progress Notes (Signed)
Pre HD assessment  

## 2017-01-06 NOTE — Progress Notes (Signed)
Post HD assessment  

## 2017-01-06 NOTE — Progress Notes (Signed)
RN was notified by on call vascular that pt.'s procedure has been cancel for today and will be performed tomorrow, and that she may have her diet restarted. Pt and pt.'s son was made aware of the update.   Rahman Ferrall [employer]

## 2017-01-06 NOTE — Progress Notes (Addendum)
Initial Nutrition Assessment  DOCUMENTATION CODES:   Severe malnutrition in context of chronic illness  INTERVENTION:   Ensure Enlive po BID, each supplement provides 350 kcal and 20 grams of protein  Renal MV daily  Vitamin C 247m BID   Recommend check phosphorus and Mg labs Labs  Liberalize diet  NUTRITION DIAGNOSIS:   Severe Malnutrition related to catabolic illness(CHF, ESRD on HD, advanced age ) as evidenced by severe fat depletion, severe muscle depletion.  GOAL:   Patient will meet greater than or equal to 90% of their needs  MONITOR:   PO intake, Supplement acceptance, Labs, Weight trends, I & O's, Skin  REASON FOR ASSESSMENT:   Other (Comment)(low BMI)    ASSESSMENT:   84y.o. Caucasian female with end-stage renal disease, history of CABG 2009, history of bilateral knee replacement, aortic valve replacement 2009, diabetes, hypertension, hypothyroidism, peripheral vascular disease, pulmonary hypertension, anxiety admitted for AMS and UTI   Met with pt in room today. Pt is a poor historian and only able to provide limited history. No family at bedside. Pt reports poor appetite and oral intake pta. When asked how her appetite is today, pt replied "ask my son CEduard Clos. Pt reports that she does drink chocolate Ensure. Per chart, it appears that pt has lost 17lbs(15%) over the past 4 months; this is severe weight loss. RD is unsure how much weight loss is r/t fluid changes. Pt noted to have bruising over her entire body; RD suspects this pt is with scurvy r/t her poor oral intake and chronic HD. Pt already ordered for vitamin C. Pt documented to be eating 50% of her meals. RD will liberalize diet and add Ensure. Pt noted to have low phosphorus on 12/30; recommend recheck phosphorus labs. Per MD note, pt to have fistulogram 1/3.    Medications reviewed and include: aspirin, tums, ceftin, plavix, colace, epoetin, folic acid, heparin, synthroid, vitamin C, protonix  Labs  reviewed: K 3.8 wnl, Cl 99(L), creat 2.16(H), Ca 8.8(L)- 1/1 P 2.2(L)- 12/30 Hgb 10.7(L), Hct 33.2(L)  Nutrition-Focused physical exam completed. Findings are severe fat and muscle depletions over entire body, and mild edema BLE. Pt noted to have bruising over entire body.    Diet Order:  Diet NPO time specified Except for: Sips with Meds Diet regular Room service appropriate? Yes; Fluid consistency: Thin; Fluid restriction: 1200 mL Fluid  EDUCATION NEEDS:   Not appropriate for education at this time  Skin:  Reviewed RN Assessment  Last BM:  12/30- type 4  Height:   Ht Readings from Last 1 Encounters:  01/03/17 5' 2"  (1.575 m)    Weight:   Wt Readings from Last 1 Encounters:  01/06/17 92 lb 8 oz (42 kg)    Ideal Body Weight:  50 kg  BMI:  Body mass index is 16.92 kg/m.  Estimated Nutritional Needs:   Kcal:  1200-1400kcal/day   Protein:  75-84g/day   Fluid:  1.2L/day or per MD  CKoleen DistanceMS, RD, LDN Pager #[phone] Hours Pager: [phone]

## 2017-01-06 NOTE — Progress Notes (Signed)
HD tx start 

## 2017-01-06 NOTE — Progress Notes (Signed)
HD tx end  

## 2017-01-06 NOTE — Evaluation (Signed)
Physical Therapy Evaluation Patient Details Name: [name] MRN: [id number] DOB: [birth year] Today's Date: 01/06/2017   History of Present Illness  presented to ER secondary to AMS, SOB; admitted with acute metabolic encephalopathy secondary to UTI, CHF.  Hospital course complicated by prolonged bleeding from L UE AVF after dialysis; pending fistulogram 01/07/17.  Clinical Impression  Patient sleeping upon arrival to room, but easily awakens for participation with session.  Extremely HOH, requiring very close range for verbal comprehension (appears to hear best from L ear).  Bilat UE/LE strength and ROM generally weak and deconditioned due to acute illness; good effort with all therapeutic activities.  Currently requiring mod assist for bed mobility; min assist for unsupported sitting balance, sit/stand, standing balance, basic transfers and gait (79') with RW.  Inconsistent, staggered stepping performance with limited balance reactions; unsafe to attempt without RW and +1 at all times.  As such, unsafe to return home alone. Would benefit from skilled PT to address above deficits and promote optimal return to PLOF; recommend transition to STR upon discharge from acute hospitalization.     Follow Up Recommendations SNF    Equipment Recommendations  Rolling walker with 5" wheels    Recommendations for Other Services       Precautions / Restrictions Precautions Precautions: Fall Precaution Comments: No BP L UE Restrictions Weight Bearing Restrictions: No      Mobility  Bed Mobility Overal bed mobility: Needs Assistance Bed Mobility: Supine to Sit     Supine to sit: Min assist;Mod assist     General bed mobility comments: assist for truncal elevation  Transfers Overall transfer level: Needs assistance Equipment used: Rolling walker (2 wheeled) Transfers: Sit to/from Stand Sit to Stand: Min assist         General transfer comment: assist for hand placement, lift  off  Ambulation/Gait Ambulation/Gait assistance: Min assist;+2 safety/equipment(chair follow for safety) Ambulation Distance (Feet): 50 Feet Assistive device: Rolling walker (2 wheeled)       General Gait Details: staggered stepping performance with inconsistent step height/length, postural sway in all directions.  Veers slightly L with gait efforts; poor balance, limited balance reactions available.  Min assist, hands-on at all times for safety.  Stairs            Wheelchair Mobility    Modified Rankin (Stroke Patients Only)       Balance Overall balance assessment: Needs assistance Sitting-balance support: No upper extremity supported;Feet supported Sitting balance-Leahy Scale: Fair     Standing balance support: Bilateral upper extremity supported Standing balance-Leahy Scale: Poor                               Pertinent Vitals/Pain Pain Assessment: No/denies pain    Home Living Family/patient expects to be discharged to:: Skilled nursing facility Living Arrangements: Alone               Additional Comments: Lives alone in one-level home with basement (houses laundry).  4 steps with L ascending rail to enter; full flight with rail to/from basement.  Son provides weekly check in/assist as needed.    Prior Function Level of Independence: Independent with assistive device(s)         Comments: Mod indep with SPC for ADLs, household and community distances; + driving; denies fall history.  Enjoys outings with son to [employer], state fair,, etc.     Hand Dominance   Dominant Hand: Right  Extremity/Trunk Assessment   Upper Extremity Assessment Upper Extremity Assessment: Generalized weakness    Lower Extremity Assessment Lower Extremity Assessment: Generalized weakness(grossly 3/5 throughout; flat-foot deformity L LE)       Communication   Communication: HOH(seems to hear best from L ear)  Cognition   Behavior During  Therapy: WFL for tasks assessed/performed Overall Cognitive Status: Impaired/Different from baseline                                 General Comments: oriented to self, location only; follows commands, but demonstrates limited insight into deficits      General Comments      Exercises Other Exercises Other Exercises: Sit/stand x3 with RW, min assist for safety Other Exercises: Unsupported sitting/standing balance with RW, min assist +1-cuing for alertness/attention to task (tends to fall asleep during rest periods).  Vitals stable and WFL; no signs/symptoms of orthostassis.   Assessment/Plan    PT Assessment Patient needs continued PT services  PT Problem List Decreased strength;Decreased range of motion;Decreased balance;Decreased activity tolerance;Decreased mobility;Decreased coordination;Decreased cognition;Decreased knowledge of use of DME;Decreased safety awareness;Decreased knowledge of precautions       PT Treatment Interventions DME instruction;Gait training;Stair training;Functional mobility training;Therapeutic activities;Therapeutic exercise;Balance training;Cognitive remediation;Patient/family education    PT Goals (Current goals can be found in the Care Plan section)  Acute Rehab PT Goals Patient Stated Goal: to go to rehab at discharge PT Goal Formulation: With patient/family Time For Goal Achievement: 01/20/17 Potential to Achieve Goals: Good    Frequency Min 2X/week   Barriers to discharge Decreased caregiver support      Co-evaluation               AM-PAC PT "6 Clicks" Daily Activity  Outcome Measure Difficulty turning over in bed (including adjusting bedclothes, sheets and blankets)?: Unable Difficulty moving from lying on back to sitting on the side of the bed? : Unable Difficulty sitting down on and standing up from a chair with arms (e.g., wheelchair, bedside commode, etc,.)?: Unable Help needed moving to and from a bed to chair  (including a wheelchair)?: A Little Help needed walking in hospital room?: A Little Help needed climbing 3-5 steps with a railing? : A Lot 6 Click Score: 11    End of Session Equipment Utilized During Treatment: Gait belt Activity Tolerance: Patient tolerated treatment well Patient left: in chair;with call bell/phone within reach;with chair alarm set;with family/visitor present Nurse Communication: Mobility status PT Visit Diagnosis: Unsteadiness on feet (R26.81);Muscle weakness (generalized) (M62.81);Difficulty in walking, not elsewhere classified (R26.2)    Time: 1638-4665 PT Time Calculation (min) (ACUTE ONLY): 28 min   Charges:   PT Evaluation $PT Eval Moderate Complexity: 1 Mod PT Treatments $Therapeutic Activity: 8-22 mins   PT G Codes:   PT G-Codes **NOT FOR INPATIENT CLASS** Functional Assessment Tool Used: AM-PAC 6 Clicks Basic Mobility Functional Limitation: Mobility: Walking and moving around Mobility: Walking and Moving Around Current Status (L9357): At least 40 percent but less than 60 percent impaired, limited or restricted Mobility: Walking and Moving Around Goal Status [id number]): At least 1 percent but less than 20 percent impaired, limited or restricted    Derra Shartzer H. Owens Shark, PT, DPT, NCS 01/06/17, 11:46 AM [phone]

## 2017-01-06 NOTE — Progress Notes (Signed)
[place], [place] 01/06/17  Subjective:  Patient seen at bedside. She is due for hemodialysis today. Working with physical therapy today.Patient seen at bedside. She is due for hemodialysis today. Working with physical therapy today.   Objective:  Vital signs in last 24 hours:  Temp:  [97.9 F (36.6 C)-98.9 F (37.2 C)] 98 F (36.7 C) (01/02 0520) Pulse Rate:  [75-86] 77 (01/02 0523) Resp:  [18-22] 20 (01/02 0520) BP: (82-162)/(38-69) 151/39 (01/02 0523) SpO2:  [91 %-97 %] 91 % (01/02 0729) Weight:  [42 kg (92 lb 8 oz)] 42 kg (92 lb 8 oz) (01/02 0500)  Weight change: -5.542 kg (-3.5 oz) Filed Weights   01/04/17 1735 01/05/17 0500 01/06/17 0500  Weight: 42.4 kg (93 lb 7.6 oz) 43.4 kg (95 lb 10.9 oz) 42 kg (92 lb 8 oz)    Intake/Output:    Intake/Output Summary (Last 24 hours) at 01/06/2017 1204 Last data filed at 01/06/2017 0300 Gross per 24 hour  Intake 5 ml  Output 0 ml  Net 5 ml     Physical Exam: General:  Frail, elderly, laying in the bed  HEENT  dry oral mucous membranes, decreased hearing  Neck  supple  Pulm/lungs  clear to auscultation today  CVS/Heart  irregular  Abdomen:   Soft, nontender  Extremities:  Trace pitting edema  Neurologic:  able to follow simple commands  Skin:  Scattered ecchymosis  Access:  AV fistula       Basic Metabolic Panel:  Recent Labs  Lab 01/03/17 0835 01/03/17 1416 01/03/17 2020 01/04/17 0117 01/05/17 0551  NA 137  --   --  139 138  K 4.2  --   --  3.6 3.8  CL 96*  --   --  99* 99*  CO2 25  --   --  29 28  GLUCOSE 110*  --   --  117* 81  BUN 24*  --   --  10 9  CREATININE 3.94* 4.33*  --  2.43* 2.16*  CALCIUM 9.3  --   --  8.4* 8.8*  PHOS  --   --  2.2*  --   --      CBC: Recent Labs  Lab 01/03/17 0835 01/03/17 1416 01/04/17 0117  WBC 7.7 8.5 7.4  NEUTROABS 6.4  --   --   HGB 13.2 12.7 10.7*  HCT 40.6 39.0 33.2*  MCV 97.7 98.0 97.5  PLT 144* 155 131*      Lab Results   Component Value Date   HEPBSAG Negative 01/03/2017   HEPBSAB Reactive 01/03/2017   HEPBIGM NEGATIVE 11/23/2010      Microbiology:  Recent Results (from the past 240 hour(s))  Blood Culture (routine x 2)     Status: None (Preliminary result)   Collection Time: 01/03/17  8:36 AM  Result Value Ref Range Status   Specimen Description BLOOD BLOOD RIGHT HAND  Final   Special Requests   Final    BOTTLES DRAWN AEROBIC AND ANAEROBIC Blood Culture results may not be optimal due to an inadequate volume of blood received in culture bottles   Culture   Final    NO GROWTH 3 DAYS Performed at [facility], [address] [place], [place] [address]    Report Status PENDING  Incomplete  Blood Culture (routine x 2)     Status: None (Preliminary result)   Collection Time: 01/03/17  8:36 AM  Result Value Ref Range Status   Specimen  Description BLOOD BLOOD RIGHT ARM  Final   Special Requests   Final    BOTTLES DRAWN AEROBIC AND ANAEROBIC Blood Culture adequate volume   Culture   Final    NO GROWTH 3 DAYS Performed at [facility], [address] [place], [place] [address]    Report Status PENDING  Incomplete  Urine culture     Status: Abnormal   Collection Time: 01/03/17  8:36 AM  Result Value Ref Range Status   Specimen Description   Final    URINE, RANDOM Performed at [facility], [address], [place], [place] [address]    Special Requests   Final    NONE Performed at [facility], [address] [place], [place] [address]    Culture >=100,000 COLONIES/mL ESCHERICHIA COLI (A)  Final   Report Status 01/05/2017 FINAL  Final   Organism ID, Bacteria ESCHERICHIA COLI (A)  Final      Susceptibility   Escherichia coli - MIC*    AMPICILLIN >=32 RESISTANT Resistant     CEFAZOLIN 16 SENSITIVE Sensitive     CEFTRIAXONE <=1 SENSITIVE Sensitive     CIPROFLOXACIN >=4 RESISTANT Resistant     GENTAMICIN >=16 RESISTANT Resistant     IMIPENEM <=0.25  SENSITIVE Sensitive     NITROFURANTOIN 64 INTERMEDIATE Intermediate     TRIMETH/SULFA <=20 SENSITIVE Sensitive     AMPICILLIN/SULBACTAM >=32 RESISTANT Resistant     PIP/TAZO <=4 SENSITIVE Sensitive     Extended ESBL NEGATIVE Sensitive     * >=100,000 COLONIES/mL ESCHERICHIA COLI  MRSA PCR Screening     Status: None   Collection Time: 01/03/17  2:30 PM  Result Value Ref Range Status   MRSA by PCR NEGATIVE NEGATIVE Final    Comment:        The GeneXpert MRSA Assay (FDA approved for NASAL specimens only), is one component of a comprehensive MRSA colonization surveillance program. It is not intended to diagnose MRSA infection nor to guide or monitor treatment for MRSA infections. Performed at [facility], [address] [place], [place] [address]     Coagulation Studies: No results for input(s): LABPROT, INR in the last 72 hours.  Urinalysis: No results for input(s): COLORURINE, LABSPEC, PHURINE, GLUCOSEU, HGBUR, BILIRUBINUR, KETONESUR, PROTEINUR, UROBILINOGEN, NITRITE, LEUKOCYTESUR in the last 72 hours.  Invalid input(s): APPERANCEUR    Imaging: No results found.   Medications:    . amLODipine  5 mg Oral Daily  . aspirin EC  81 mg Oral QHS  . calcium carbonate  1 tablet Oral TID WC  . cefUROXime  250 mg Oral Q48H  . cloNIDine  0.1 mg Oral BID  . clopidogrel  75 mg Oral Daily  . docusate sodium  100 mg Oral BID  . epoetin (EPOGEN/PROCRIT) injection  4,000 Units Intravenous Q M,W,F-HD  . folic acid  0.5 mg Oral Daily  . Ganciclovir  1 drop Right Eye QID  . heparin  5,000 Units Subcutaneous Q8H  . hydrALAZINE  25 mg Oral Q8H  . ipratropium-albuterol  3 mL Nebulization BID  . levothyroxine  75 mcg Oral Daily  . metoprolol tartrate  50 mg Oral BID  . nitroGLYCERIN  1 inch Topical Q6H  . pantoprazole (PROTONIX) IV  40 mg Intravenous Q12H  . pravastatin  40 mg Oral QPM  . ascorbic acid  500 mg Oral Daily   acetaminophen **OR** acetaminophen, bisacodyl,  hydrALAZINE, ondansetron **OR** ondansetron (ZOFRAN) IV, polyvinyl alcohol  Assessment/ Plan:  84  y.o. Caucasian female with end-stage renal disease, history of CABG 2009, history of bilateral knee replacement, aortic valve replacement 2009, diabetes, hypertension, hypothyroidism, peripheral vascular disease, pulmonary hypertension, anxiety Patient is admitted for confusion, hallucinations and altered mental status and is diagnosed with UTI  [facility] nephrology/MWF  1.  ESRD 2.  Altered mental status (patient was living independently and was able to drive prior to admission ) 3.  Urinary tract infection 4.  Acute pulmonary edema 5.  Hypertension 6.  Anemia of chronic kidney disease, hemoglobin 10.7 7.  Secondary hyperparathyroidism  -Patient due for hemodialysis today.  Orders have been prepared.  Fistulogram has been delayed till tomorrow.  Overall her mental status has significantly improved.  She still remains quite hard of hearing.  We will continue to monitor her progress over the course of the hospitalization.   LOS: 3 Datha Kissinger 1/2/201912:04 PM  [facility], [place]

## 2017-01-06 NOTE — Progress Notes (Signed)
PHARMACY NOTE:  ANTIMICROBIAL RENAL DOSAGE ADJUSTMENT  Current antimicrobial regimen includes a mismatch between antimicrobial dosage and estimated renal function.  As per policy approved by the Pharmacy & Therapeutics and Medical Executive Committees, the antimicrobial dosage will be adjusted accordingly.  Current antimicrobial dosage:  Ceftin 250 mg Q12h  Indication: UTI  Renal Function: Hemodialysis  Estimated Creatinine Clearance: 10.6 mL/min (A) (by C-G formula based on SCr of 2.16 mg/dL (H)). [x]      On intermittent HD, scheduled: MWF     Antimicrobial dosage has been changed to:  Ceftin 250 mg PO Q48h (dose to be given after dialysis on dialysis days)  Additional comments:   Thank you for allowing pharmacy to be a part of this patient's care.  [place], [facility] 01/06/2017 10:07 AM

## 2017-01-06 NOTE — Progress Notes (Signed)
Transport arrived at [facility], transport delayed r/t prolonged venous access site bleeding. Site held for an additional 10 minutes.

## 2017-01-07 ENCOUNTER — Inpatient Hospital Stay
Admission: EM | Disposition: A | Discharge status: Skilled Nursing Facility | Payer: Self-pay | Source: Home / Self Care | Attending: Internal Medicine

## 2017-01-07 DIAGNOSIS — E43 Unspecified severe protein-calorie malnutrition: Secondary | ICD-10-CM

## 2017-01-07 DIAGNOSIS — N186 End stage renal disease: Secondary | ICD-10-CM

## 2017-01-07 DIAGNOSIS — T82868A Thrombosis of vascular prosthetic devices, implants and grafts, initial encounter: Secondary | ICD-10-CM

## 2017-01-07 HISTORY — PX: A/V FISTULAGRAM: CATH118298

## 2017-01-07 HISTORY — PX: A/V SHUNT INTERVENTION: CATH118220

## 2017-01-07 LAB — PHOSPHORUS: Phosphorus: 2.5 mg/dL (ref 2.5–4.6)

## 2017-01-07 LAB — MAGNESIUM: MAGNESIUM: 1.9 mg/dL (ref 1.7–2.4)

## 2017-01-07 SURGERY — A/V SHUNT INTERVENTION
Anesthesia: Moderate Sedation

## 2017-01-07 MED ORDER — FENTANYL CITRATE (PF) 100 MCG/2ML IJ SOLN
INTRAMUSCULAR | Status: DC | PRN
  Administered 2017-01-07: 25 ug via INTRAVENOUS

## 2017-01-07 MED ORDER — FENTANYL CITRATE (PF) 100 MCG/2ML IJ SOLN
INTRAMUSCULAR | Status: AC
  Filled 2017-01-07: qty 2

## 2017-01-07 MED ORDER — MIDAZOLAM HCL 5 MG/5ML IJ SOLN
INTRAMUSCULAR | Status: AC
  Filled 2017-01-07: qty 5

## 2017-01-07 MED ORDER — SODIUM CHLORIDE 0.9 % IV SOLN: INTRAVENOUS | Status: DC

## 2017-01-07 MED ORDER — CHLORHEXIDINE GLUCONATE CLOTH 2 % EX PADS
6.0000 | MEDICATED_PAD | Freq: Once | CUTANEOUS | Status: AC
  Administered 2017-01-07: 6 via TOPICAL

## 2017-01-07 MED ORDER — HEPARIN SODIUM (PORCINE) 1000 UNIT/ML IJ SOLN
INTRAMUSCULAR | Status: DC | PRN
  Administered 2017-01-07: 2500 [IU] via INTRAVENOUS

## 2017-01-07 MED ORDER — CEFAZOLIN SODIUM 1 G IJ SOLR
Freq: Once | INTRAMUSCULAR | Status: AC
  Administered 2017-01-07: 17:00:00 via INTRAVENOUS
  Filled 2017-01-07: qty 10

## 2017-01-07 MED ORDER — HEPARIN (PORCINE) IN NACL 2-0.9 UNIT/ML-% IJ SOLN
INTRAMUSCULAR | Status: AC
  Filled 2017-01-07: qty 1000

## 2017-01-07 MED ORDER — LIDOCAINE-EPINEPHRINE (PF) 1 %-1:200000 IJ SOLN
INTRAMUSCULAR | Status: AC
  Filled 2017-01-07: qty 30

## 2017-01-07 MED ORDER — IOPAMIDOL (ISOVUE-300) INJECTION 61%
INTRAVENOUS | Status: DC | PRN
  Administered 2017-01-07: 20 mL via INTRAVENOUS

## 2017-01-07 MED ORDER — CEFAZOLIN SODIUM-DEXTROSE 1-4 GM/50ML-% IV SOLN: 1.0000 g | Freq: Once | INTRAVENOUS | Status: DC

## 2017-01-07 MED ORDER — HEPARIN SODIUM (PORCINE) 1000 UNIT/ML IJ SOLN
INTRAMUSCULAR | Status: AC
  Filled 2017-01-07: qty 1

## 2017-01-07 MED ORDER — MIDAZOLAM HCL 2 MG/2ML IJ SOLN
INTRAMUSCULAR | Status: DC | PRN
  Administered 2017-01-07: 1 mg via INTRAVENOUS

## 2017-01-07 SURGICAL SUPPLY — 9 items
BALLN LUTONIX DCB 7X60X130 (BALLOONS) ×3
BALLOON LUTONIX DCB 7X60X130 (BALLOONS) ×1 IMPLANT
CANNULA 5F STIFF (CANNULA) ×3 IMPLANT
DEVICE PRESTO INFLATION (MISCELLANEOUS) ×3 IMPLANT
DRAPE BRACHIAL (DRAPES) ×3 IMPLANT
PACK ANGIOGRAPHY (CUSTOM PROCEDURE TRAY) ×3 IMPLANT
SHEATH BRITE TIP 6FRX5.5 (SHEATH) ×3 IMPLANT
SUT MNCRL AB 4-0 PS2 18 (SUTURE) ×3 IMPLANT
WIRE MAGIC TOR.035 180C (WIRE) ×3 IMPLANT

## 2017-01-07 NOTE — H&P (Signed)
[facility] History & Physical Update  The patient was interviewed and re-examined.  The patient's previous History and Physical has been reviewed and is unchanged.  There is no change in the plan of care. We plan to proceed with the scheduled procedure.  Leotis Pain, MD  01/07/2017, 9:16 AM

## 2017-01-07 NOTE — Progress Notes (Signed)
[place] [facility] [place] NAME: [name]    MR#:  [id number]  DATE OF BIRTH:  1920-03-15  SUBJECTIVE:  Patient out in the chair.  Son in the room.  Patient is hard on hearing.  Denies any complaints.  She is waiting for her fistulogram today.  REVIEW OF SYSTEMS:   Review of Systems  Constitutional: Negative for chills, fever and weight loss.  HENT: Negative for ear discharge, ear pain and nosebleeds.   Eyes: Positive for blurred vision. Negative for discharge.  Respiratory: Negative for sputum production, shortness of breath, wheezing and stridor.   Cardiovascular: Negative for chest pain, palpitations, orthopnea and PND.  Gastrointestinal: Negative for abdominal pain, diarrhea, nausea and vomiting.  Genitourinary: Negative for frequency and urgency.  Musculoskeletal: Negative for back pain and joint pain.  Neurological: Positive for weakness. Negative for sensory change, speech change and focal weakness.  Psychiatric/Behavioral: Negative for depression and hallucinations. The patient is not nervous/anxious.    Tolerating Diet:yes Tolerating PT: possible rehab vs STR  DRUG ALLERGIES:   Allergies  Allergen Reactions  . Nitrofurantoin Itching  . Captopril Other (See Comments)    REACTION: unspecified  . Enalapril Maleate Cough  . Ramipril Other (See Comments)    REACTION: unspecified  . Sulfa Antibiotics Other (See Comments)    Reaction unknown  . Verapamil Other (See Comments)    REACTION: unspecified    VITALS:  Blood pressure (!) 161/55, pulse 77, temperature 98.5 F (36.9 C), temperature source Oral, resp. rate 18, height 5\' 2"  (1.575 m), weight 42.2 kg (93 lb 0.6 oz), SpO2 97 %.  PHYSICAL EXAMINATION:   Physical Exam  GENERAL:  84 y.o.-year-old patient lying in the bed with no acute distress.  EYES: Pupils equal, round, reactive to light and accommodation. No scleral icterus. Right eye small  Mild congestion  HEENT:  Head atraumatic, normocephalic. Oropharynx and nasopharynx clear.  NECK:  Supple, no jugular venous distention. No thyroid enlargement, no tenderness.  LUNGS: Normal breath sounds bilaterally, no wheezing, rales, rhonchi. No use of accessory muscles of respiration.  CARDIOVASCULAR: S1, S2 normal. No murmurs, rubs, or gallops.  ABDOMEN: Soft, nontender, nondistended. Bowel sounds present. No organomegaly or mass.  EXTREMITIES: No cyanosis, clubbing or edema b/l.    NEUROLOGIC: Cranial nerves II through XII are intact. No focal Motor or sensory deficits b/l.   PSYCHIATRIC:  patient is alert SKIN: No obvious rash, lesion, or ulcer.   LABORATORY PANEL:  CBC Recent Labs  Lab 01/04/17 0117  WBC 7.4  HGB 10.7*  HCT 33.2*  PLT 131*    Chemistries  Recent Labs  Lab 01/04/17 0117 01/05/17 0551 01/07/17 0357  NA 139 138  --   K 3.6 3.8  --   CL 99* 99*  --   CO2 29 28  --   GLUCOSE 117* 81  --   BUN 10 9  --   CREATININE 2.43* 2.16*  --   CALCIUM 8.4* 8.8*  --   MG  --   --  1.9  AST 22  --   --   ALT 8*  --   --   ALKPHOS 103  --   --   BILITOT 1.8*  --   --    Cardiac Enzymes Recent Labs  Lab 01/04/17 0117  TROPONINI 0.07*   RADIOLOGY:  No results found. ASSESSMENT AND PLAN:  84 year old female with end-stage renal disease on hemodialysis who is recently prescribed  Valtrex for right eye shingles who presents with shortness of breath and hallucinations.  1. Acute metabolic encephalopathy in the setting of Escherichia coli UTI and Valtrex This has improved cont oral Ceftin  2. End-stage renal disease on hemodialysis with acute pulmonary edema which is improved:  -Patient will continue dialysis  Monday, Wednesday and Friday She will undergo fistulogram today due to concerns of bleeding from fistula site.(cancelled today)  3. Recent diagnosis of herpes simplex right eye: I spoke with her OPTHOMOLOGIST who RECS changing Valtrex to GENT eye drops given concern for  hallucinations and confusion She will need to follow-up after discharge with her ophthalmologist.  4.Acccelerated essential hypertension due to pulmonary edema..improved BP Continue clonidine, hydralazine, metoprolol, Norvasc   5. Hypothyroid: Continue Synthroid  6. Hyperlipidemia: Continue statin   7. GERD on PPI   8. Chronic diastolic heart failure with preserved ejection fraction and moderate to severe tricuspid regurgitation: Cardiology consultation appreciated Pulmonary edema was due to end-stage renal disease. Patient will need a follow-up with cardiology after discharge  9. Elevated troponin: Patient is ruled out for ACS. Elevation in troponin is due to demand ischemia and poor renal clearance.  10.  Generalized weakness.  Physical therapy as outpatient.  Recommends rehab.  She however ambulated 80 feet.  Will awaiting for insurance authorization C patient had to go to rehab else patient will go home with home health PT RN.  This was discussed with son at length he is agreeable.    Case discussed with Care Management/Social Worker.  CODE STATUS: DNR  DVT Prophylaxis: heparin  TOTAL TIME TAKING CARE OF THIS PATIENT: *40* minutes.  >50% time spent on counselling and coordination of care  POSSIBLE D/C IN 1-2 DAYS, DEPENDING ON CLINICAL CONDITION.  Note: This dictation was prepared with Dragon dictation along with smaller phrase technology. Any transcriptional errors that result from this process are unintentional.  Fritzi Mandes M.D on 01/07/2017 at 11:51 AM  Between 7am to 6pm - Pager - [phone]  After 6pm go to [email or web address] - password [email or web address] [facility]  Office  [phone]  CC: Primary care physician; [name], MDPatient ID: [name], female   DOB: 09/22/1920, 84 y.o.   MRN: [id number]

## 2017-01-07 NOTE — NC FL2 (Signed)
[place] LEVEL OF CARE SCREENING TOOL     IDENTIFICATION  Patient Name: [name] Birthdate: [birth year] Sex: female Admission Date (Current Location): 01/03/2017  [place] and [place] Number:  Engineering geologist and Address:  [facility], [address] [place], [place] [address]      Provider Number: [id number]  Attending Physician Name and Address:  Fritzi Mandes, MD  Relative Name and Phone Number:       Current Level of Care: Hospital Recommended Level of Care: [place] Prior Approval Number:    Date Approved/Denied:   PASRR Number:    Discharge Plan: SNF    Current Diagnoses: Patient Active Problem List   Diagnosis Date Noted  . Protein-calorie malnutrition, severe 01/07/2017  . CHF (congestive heart failure) ([place]) 01/03/2017  . UTI (urinary tract infection) 01/03/2017  . Acute metabolic encephalopathy 77/82/4235  . Cerebrovascular accident (CVA) due to occlusion of right middle cerebral artery ([place]) 10/24/2015  . Malnutrition of mild degree ([place]) 10/24/2015  . Altered mental status 09/18/2015  . Preventative health care 05/03/2014  . Peripheral vascular disease ([place]) 12/21/2013  . Toe amputation status ([place]) 08/29/2013  . History of recent fall 04/18/2013  . HTN (hypertension), malignant 03/22/2013  . S/P aortic valve replacement with bioprosthetic valve 11/15/2012  . S/P CABG x 3 11/15/2012  . Anemia of chronic kidney failure 12/22/2010  . Thrombocytopenia ([place]) 12/13/2010  . End stage renal disease on dialysis ([place]) 12/13/2010  . S/P aortic valve replacement 08/11/2010  . Arterial insufficiency-lower 07/25/2010  . NEURODERMATITIS 04/08/2009  . Carotid arterial disease ([place]) 01/01/2009  . Coronary atherosclerosis 02/11/2007  . AORTIC STENOSIS 10/04/2006  . ANXIETY 05/30/2006  . Pulmonary hypertension ([place]) 05/30/2006  . Hypothyroidism 05/27/2006  . Type 2 diabetes mellitus with renal  manifestations, controlled ([place]) 05/27/2006  . Hyperlipidemia 05/27/2006  . Essential hypertension 05/27/2006  . GERD 05/27/2006  . OSTEOARTHRITIS 05/27/2006  . OSTEOPENIA 05/27/2006  . URINARY INCONTINENCE 05/27/2006    Orientation RESPIRATION BLADDER Height & Weight     Self, Place  Normal Incontinent Weight: 93 lb 0.6 oz (42.2 kg) Height:  5\' 2"  (157.5 cm)  BEHAVIORAL SYMPTOMS/MOOD NEUROLOGICAL BOWEL NUTRITION STATUS  (no issues) (none) Incontinent Diet(regular)  AMBULATORY STATUS COMMUNICATION OF NEEDS Skin   Limited Assist Verbally Normal                       Personal Care Assistance Level of Assistance  Bathing, Dressing Bathing Assistance: Limited assistance Feeding assistance: Limited assistance Dressing Assistance: Limited assistance Total Care Assistance: Limited assistance   Functional Limitations Info  Hearing, Sight Sight Info: Impaired Hearing Info: Impaired Speech Info: Adequate    SPECIAL CARE FACTORS FREQUENCY  PT (By licensed PT)     PT Frequency: x5 OT Frequency: x5            Contractures Contractures Info: Not present    Additional Factors Info  Code Status Code Status Info: dnr             Current Medications (01/07/2017):  This is the current hospital active medication list Current Facility-Administered Medications  Medication Dose Route Frequency Provider Last Rate Last Dose  . 0.9 %  sodium chloride infusion   Intravenous Continuous Algernon Huxley, MD      . acetaminophen (TYLENOL) tablet 650 mg  650 mg Oral Q6H PRN Idelle Crouch, MD       Or  . acetaminophen (TYLENOL)  suppository 650 mg  650 mg Rectal Q6H PRN Idelle Crouch, MD      . amLODipine (NORVASC) tablet 5 mg  5 mg Oral Daily Idelle Crouch, MD   Stopped at 01/06/17 [phone]  . aspirin EC tablet 81 mg  81 mg Oral QHS Idelle Crouch, MD   81 mg at 01/06/17 2127  . bisacodyl (DULCOLAX) suppository 10 mg  10 mg Rectal Daily PRN Idelle Crouch, MD      .  calcium carbonate (TUMS - dosed in mg elemental calcium) chewable tablet 200 mg of elemental calcium  1 tablet Oral TID WC Idelle Crouch, MD   200 mg of elemental calcium at 01/06/17 1404  . cefUROXime (CEFTIN) tablet 250 mg  250 mg Oral Q48H Mody, Sital, MD   250 mg at 01/06/17 2232  . cefUROXime (ZINACEF) 1.5 g in dextrose 5 % 50 mL IVPB  1.5 g Intravenous 30 min Pre-Op Algernon Huxley, MD      . cloNIDine (CATAPRES) tablet 0.1 mg  0.1 mg Oral BID Idelle Crouch, MD   0.1 mg at 01/06/17 2127  . clopidogrel (PLAVIX) tablet 75 mg  75 mg Oral Daily Idelle Crouch, MD   75 mg at 01/06/17 2126  . docusate sodium (COLACE) capsule 100 mg  100 mg Oral BID Idelle Crouch, MD   100 mg at 01/06/17 2125  . epoetin alfa (EPOGEN,PROCRIT) injection 4,000 Units  4,000 Units Intravenous Q M,W,F-HD Murlean Iba, MD   4,000 Units at 01/06/17 1611  . feeding supplement (ENSURE ENLIVE) (ENSURE ENLIVE) liquid 237 mL  237 mL Oral TID BM Mody, Sital, MD   237 mL at 01/06/17 1404  . folic acid (FOLVITE) tablet 0.5 mg  0.5 mg Oral Daily Idelle Crouch, MD   Stopped at 01/06/17 0945  . Ganciclovir (ZIRGAN) 0.15 % ophthalmic gel 1 drop  1 drop Right Eye QID Bettey Costa, MD   1 drop at 01/07/17 1000  . heparin injection 5,000 Units  5,000 Units Subcutaneous Q8H Idelle Crouch, MD   5,000 Units at 01/07/17 0510  . hydrALAZINE (APRESOLINE) injection 10 mg  10 mg Intravenous Q4H PRN Idelle Crouch, MD   10 mg at 01/07/17 0514  . hydrALAZINE (APRESOLINE) tablet 25 mg  25 mg Oral Q8H Idelle Crouch, MD   25 mg at 01/06/17 2126  . ipratropium-albuterol (DUONEB) 0.5-2.5 (3) MG/3ML nebulizer solution 3 mL  3 mL Nebulization BID Idelle Crouch, MD   3 mL at 01/07/17 0814  . levothyroxine (SYNTHROID, LEVOTHROID) tablet 75 mcg  75 mcg Oral Daily Idelle Crouch, MD   75 mcg at 01/06/17 0542  . metoprolol tartrate (LOPRESSOR) tablet 50 mg  50 mg Oral BID Idelle Crouch, MD   50 mg at 01/07/17 1000  .  multivitamin (RENA-VIT) tablet 1 tablet  1 tablet Oral QHS Bettey Costa, MD   1 tablet at 01/06/17 2126  . nitroGLYCERIN (NITROGLYN) 2 % ointment 1 inch  1 inch Topical Q6H Idelle Crouch, MD   1 inch at 01/07/17 0511  . ondansetron (ZOFRAN) tablet 4 mg  4 mg Oral Q6H PRN Idelle Crouch, MD       Or  . ondansetron (ZOFRAN) injection 4 mg  4 mg Intravenous Q6H PRN Idelle Crouch, MD      . pantoprazole (PROTONIX) injection 40 mg  40 mg Intravenous Q12H Idelle Crouch, MD   40 mg at  01/07/17 1000  . polyvinyl alcohol (LIQUIFILM TEARS) 1.4 % ophthalmic solution 1 drop  1 drop Both Eyes PRN Idelle Crouch, MD      . pravastatin (PRAVACHOL) tablet 40 mg  40 mg Oral QPM Idelle Crouch, MD   40 mg at 01/06/17 2125  . vitamin C (ASCORBIC ACID) tablet 250 mg  250 mg Oral BID Bettey Costa, MD   250 mg at 01/06/17 2127     Discharge Medications: Please see discharge summary for a list of discharge medications.  Relevant Imaging Results:  Relevant Lab Results:   Additional Information Hemodialysis MWF  Shela Leff, LCSW

## 2017-01-07 NOTE — Clinical Social Work Note (Signed)
CSW has extended bed offers to patient and son and they have [facility] is aware and will obtain prior auth. Shela Leff MSW,LCSW [phone]

## 2017-01-07 NOTE — Clinical Social Work Note (Signed)
PT assessed patient yesterday and today. PT recommends STR. Patient will require prior auth due to medicare uhc requiring it as of 01/05/17. CSW spoke with patient's son and informed him that due to patient's ambulation of 33 ft yesterday, that [facility]c may not approve for her to go to STR this time. Patient has been to STR several times in the past and states that his mother always go to rehab after she comes into the hospital for a urinary tract infection. Patient's son wishes to try to see if she can go again to STR. Bedsearch has been restarted. Shela Leff MSW,LCSW [phone]

## 2017-01-07 NOTE — Progress Notes (Signed)
Physical Therapy Treatment Patient Details Name: [name] MRN: [id number] DOB: 1920/12/08 Today's Date: 01/07/2017    History of Present Illness presented to ER secondary to AMS, SOB; admitted with acute metabolic encephalopathy secondary to UTI, CHF.  Hospital course complicated by prolonged bleeding from L UE AVF after dialysis; pending fistulogram 01/07/17.    PT Comments    Discussed with primary nurse.  OK for therapy today.  Awaiting procedure this am.  Pt in bed, eyes closed but awakens easy.  To edge of bed with encouragement but able to sit with rails and no assist.  Sitting with min guard/supervision.  She stood with min a x 1 for balance and support.  She was able to increase her ambulation distance to 19' with walker and min/ occasional mod assist due to poor walker placement and forward lean.  Son was available to follow with wheelchair as pt voiced fear of falling.  Generally tolerated well and chose to remain up in recliner at end of session.   Follow Up Recommendations  SNF     Equipment Recommendations  Rolling walker with 5" wheels    Recommendations for Other Services       Precautions / Restrictions Precautions Precautions: Fall Precaution Comments: No BP L UE Restrictions Weight Bearing Restrictions: No    Mobility  Bed Mobility Overal bed mobility: Needs Assistance Bed Mobility: Supine to Sit     Supine to sit: Supervision     General bed mobility comments: encouragement but able to do without assist, uses rails but HOB flat  Transfers Overall transfer level: Needs assistance Equipment used: Rolling walker (2 wheeled) Transfers: Sit to/from Stand Sit to Stand: Min assist         General transfer comment: assist for hand placement, lift off  Ambulation/Gait Ambulation/Gait assistance: Min assist;Mod assist;+2 safety/equipment Ambulation Distance (Feet): 80 Feet Assistive device: Rolling walker (2 wheeled) Gait Pattern/deviations:  Step-through pattern;Decreased step length - right;Decreased step length - left   Gait velocity interpretation: Below normal speed for age/gender General Gait Details: min-mod assist.  Generally unsteady with forward lean at times.     Stairs            Wheelchair Mobility    Modified Rankin (Stroke Patients Only)       Balance Overall balance assessment: Needs assistance Sitting-balance support: No upper extremity supported;Feet supported Sitting balance-Leahy Scale: Fair     Standing balance support: Bilateral upper extremity supported Standing balance-Leahy Scale: Poor                              Cognition Arousal/Alertness: Awake/alert Behavior During Therapy: WFL for tasks assessed/performed Overall Cognitive Status: Within Functional Limits for tasks assessed                                        Exercises      General Comments        Pertinent Vitals/Pain Pain Assessment: No/denies pain    Home Living                      Prior Function            PT Goals (current goals can now be found in the care plan section) Progress towards PT goals: Progressing toward goals    Frequency  Min 2X/week      PT Plan Current plan remains appropriate    Co-evaluation              AM-PAC PT "6 Clicks" Daily Activity  Outcome Measure  Difficulty turning over in bed (including adjusting bedclothes, sheets and blankets)?: A Little Difficulty moving from lying on back to sitting on the side of the bed? : A Little Difficulty sitting down on and standing up from a chair with arms (e.g., wheelchair, bedside commode, etc,.)?: Unable Help needed moving to and from a bed to chair (including a wheelchair)?: A Little Help needed walking in hospital room?: A Little Help needed climbing 3-5 steps with a railing? : A Lot 6 Click Score: 15    End of Session Equipment Utilized During Treatment: Gait belt Activity  Tolerance: Patient tolerated treatment well Patient left: in chair;with chair alarm set;with call bell/phone within reach;with family/visitor present         Time: 5732-2025 PT Time Calculation (min) (ACUTE ONLY): 12 min  Charges:  $Gait Training: 8-22 mins                    G Codes:       Chesley Noon, PTA 01/07/17, 9:27 AM

## 2017-01-07 NOTE — Op Note (Signed)
[facility]    OPERATIVE NOTE   PROCEDURE: 1.   Left brachiocephalic arteriovenous fistula cannulation under ultrasound guidance 2.   Left arm fistulagram including central venogram 3.   Percutaneous transluminal angioplasty of mid upper arm left cephalic vein with 7 mm diameter by 6 cm Lutonix drug-coated angioplasty balloon  PRE-OPERATIVE DIAGNOSIS: 1. ESRD 2. Poorly functional left brachiocephalic AVF  POST-OPERATIVE DIAGNOSIS: same as above   SURGEON: Leotis Pain, MD  ANESTHESIA: local with MCS  ESTIMATED BLOOD LOSS: 5 cc  FINDING(S): 1. 80-90% stenosis of the cephalic vein in the mid upper arm just proximal to her access sites.  Her previously placed stents up to the cephalic vein subclavian vein confluence were widely patent as well as the central venous circulation.  The anastomosis and perianastomotic cephalic vein were also widely patent.  SPECIMEN(S):  None  CONTRAST: 20 cc  FLUORO TIME: 0.5 minutes  MODERATE CONSCIOUS SEDATION TIME: Approximately 15 minutes with 1 mg of Versed and 25 Mcg of Fentanyl   INDICATIONS: [name] is a 84 y.o. female who presents with malfunctioning left brachiocephalic arteriovenous fistula.  She has prolonged bleeding.  The patient is scheduled for left arm fistulagram.  The patient is aware the risks include but are not limited to: bleeding, infection, thrombosis of the cannulated access, and possible anaphylactic reaction to the contrast.  The patient is aware of the risks of the procedure and elects to proceed forward.  DESCRIPTION: After full informed written consent was obtained, the patient was brought back to the angiography suite and placed supine upon the angiography table.  The patient was connected to monitoring equipment. Moderate conscious sedation was administered with a face to face encounter with the patient throughout the procedure with my supervision of the RN administering medicines and monitoring  the patient's vital signs and mental status throughout from the start of the procedure until the patient was taken to the recovery room. The left arm was prepped and draped in the standard fashion for a percutaneous access intervention.  Under ultrasound guidance, the left brachiocephalic arteriovenous fistula was cannulated with a micropuncture needle under direct ultrasound guidance and a permanent image was performed.  The microwire was advanced into the fistula and the needle was exchanged for the a microsheath.  I then upsized to a 6 Fr Sheath and imaging was performed.  Hand injections were completed to image the access including the central venous system. This demonstrated 80-90% stenosis of the cephalic vein in the mid upper arm just proximal to her access sites.  Her previously placed stents up to the cephalic vein subclavian vein confluence were widely patent as well as the central venous circulation.  The anastomosis and perianastomotic cephalic vein were also widely patent.  Based on the images, this patient will need intervention to the mid upper arm cephalic. I then gave the patient 2500 units of intravenous heparin.  I then crossed the stenosis with a Magic Tourqe wire.  Based on the imaging, a 7 mm x 6 cm Lutonix drug-coated angioplasty balloon was selected.  The balloon was centered around the mid upper arm cephalic vein stenosis and inflated to 12 ATM for 1 minute(s).  On completion imaging, a 10-15 % residual stenosis was present.     Based on the completion imaging, no further intervention is necessary.  The wire and balloon were removed from the sheath.  A 4-0 Monocryl purse-string suture was sewn around the sheath.  The sheath was removed  while tying down the suture.  A sterile bandage was applied to the puncture site.  COMPLICATIONS: None  CONDITION: Stable   Leotis Pain  01/07/2017 2:52 PM   This note was created with Dragon Medical transcription system. Any errors in dictation  are purely unintentional.

## 2017-01-08 ENCOUNTER — Encounter: Payer: Self-pay | Admitting: Vascular Surgery

## 2017-01-08 DIAGNOSIS — N390 Urinary tract infection, site not specified: Secondary | ICD-10-CM | POA: Diagnosis not present

## 2017-01-08 DIAGNOSIS — N2581 Secondary hyperparathyroidism of renal origin: Secondary | ICD-10-CM | POA: Diagnosis not present

## 2017-01-08 DIAGNOSIS — Z471 Aftercare following joint replacement surgery: Secondary | ICD-10-CM | POA: Diagnosis not present

## 2017-01-08 DIAGNOSIS — E039 Hypothyroidism, unspecified: Secondary | ICD-10-CM | POA: Diagnosis not present

## 2017-01-08 DIAGNOSIS — I10 Essential (primary) hypertension: Secondary | ICD-10-CM | POA: Diagnosis not present

## 2017-01-08 DIAGNOSIS — I2510 Atherosclerotic heart disease of native coronary artery without angina pectoris: Secondary | ICD-10-CM | POA: Diagnosis not present

## 2017-01-08 DIAGNOSIS — G9340 Encephalopathy, unspecified: Secondary | ICD-10-CM | POA: Diagnosis not present

## 2017-01-08 DIAGNOSIS — D631 Anemia in chronic kidney disease: Secondary | ICD-10-CM | POA: Diagnosis not present

## 2017-01-08 DIAGNOSIS — Z8639 Personal history of other endocrine, nutritional and metabolic disease: Secondary | ICD-10-CM | POA: Diagnosis not present

## 2017-01-08 DIAGNOSIS — B0239 Other herpes zoster eye disease: Secondary | ICD-10-CM | POA: Diagnosis not present

## 2017-01-08 DIAGNOSIS — G9341 Metabolic encephalopathy: Secondary | ICD-10-CM | POA: Diagnosis not present

## 2017-01-08 DIAGNOSIS — Z96653 Presence of artificial knee joint, bilateral: Secondary | ICD-10-CM | POA: Diagnosis not present

## 2017-01-08 DIAGNOSIS — N3000 Acute cystitis without hematuria: Secondary | ICD-10-CM | POA: Diagnosis not present

## 2017-01-08 DIAGNOSIS — N186 End stage renal disease: Secondary | ICD-10-CM | POA: Diagnosis not present

## 2017-01-08 DIAGNOSIS — D509 Iron deficiency anemia, unspecified: Secondary | ICD-10-CM | POA: Diagnosis not present

## 2017-01-08 DIAGNOSIS — J810 Acute pulmonary edema: Secondary | ICD-10-CM | POA: Diagnosis not present

## 2017-01-08 DIAGNOSIS — I509 Heart failure, unspecified: Secondary | ICD-10-CM | POA: Diagnosis not present

## 2017-01-08 LAB — CULTURE, BLOOD (ROUTINE X 2)
CULTURE: NO GROWTH
Culture: NO GROWTH
Special Requests: ADEQUATE

## 2017-01-08 LAB — PHOSPHORUS: Phosphorus: 3.2 mg/dL (ref 2.5–4.6)

## 2017-01-08 MED ORDER — GANCICLOVIR 0.15 % OP GEL: 1.0000 [drp] | Freq: Four times a day (QID) | OPHTHALMIC | 0 refills | Status: AC

## 2017-01-08 MED ORDER — CEFUROXIME AXETIL 250 MG PO TABS: 250.0000 mg | ORAL_TABLET | ORAL | 0 refills | Status: AC

## 2017-01-08 MED ORDER — RENA-VITE PO TABS: 1.0000 | ORAL_TABLET | Freq: Every day | ORAL | 0 refills | Status: AC

## 2017-01-08 MED ORDER — ENSURE ENLIVE PO LIQD: 237.0000 mL | Freq: Three times a day (TID) | ORAL | 12 refills | Status: AC

## 2017-01-08 NOTE — Progress Notes (Signed)
[place], [place] 01/08/17  Subjective:  Patient seen at bedside. She did have a fistulogram with intervention yesterday. Patient due for dialysis today.   Objective:  Vital signs in last 24 hours:  Temp:  [97.8 F (36.6 C)-98.9 F (37.2 C)] 98.3 F (36.8 C) (01/04 0534) Pulse Rate:  [73-88] 73 (01/04 0534) Resp:  [17-27] 20 (01/04 0534) BP: (151-177)/(43-58) 159/43 (01/04 0534) SpO2:  [93 %-96 %] 94 % (01/04 0728) Weight:  [40.1 kg (88 lb 4.8 oz)] 40.1 kg (88 lb 4.8 oz) (01/04 0534)  Weight change: -3.247 kg (-2.6 oz) Filed Weights   01/06/17 1452 01/06/17 1842 01/08/17 0534  Weight: 43.3 kg (95 lb 7.4 oz) 42.2 kg (93 lb 0.6 oz) 40.1 kg (88 lb 4.8 oz)    Intake/Output:    Intake/Output Summary (Last 24 hours) at 01/08/2017 1002 Last data filed at 01/08/2017 0534 Gross per 24 hour  Intake 47.85 ml  Output -  Net 47.85 ml     Physical Exam: General:  Frail, elderly, laying in the bed  HEENT  dry oral mucous membranes, decreased hearing  Neck  supple  Pulm/lungs  clear to auscultation today  CVS/Heart  irregular  Abdomen:   Soft, nontender  Extremities:  Trace pitting edema  Neurologic:  able to follow simple commands  Skin:  Scattered ecchymosis  Access:  AV fistula       Basic Metabolic Panel:  Recent Labs  Lab 01/03/17 0835 01/03/17 1416 01/03/17 2020 01/04/17 0117 01/05/17 0551 01/07/17 0357  NA 137  --   --  139 138  --   K 4.2  --   --  3.6 3.8  --   CL 96*  --   --  99* 99*  --   CO2 25  --   --  29 28  --   GLUCOSE 110*  --   --  117* 81  --   BUN 24*  --   --  10 9  --   CREATININE 3.94* 4.33*  --  2.43* 2.16*  --   CALCIUM 9.3  --   --  8.4* 8.8*  --   MG  --   --   --   --   --  1.9  PHOS  --   --  2.2*  --   --  2.5     CBC: Recent Labs  Lab 01/03/17 0835 01/03/17 1416 01/04/17 0117  WBC 7.7 8.5 7.4  NEUTROABS 6.4  --   --   HGB 13.2 12.7 10.7*  HCT 40.6 39.0 33.2*  MCV 97.7 98.0 97.5  PLT 144*  155 131*      Lab Results  Component Value Date   HEPBSAG Negative 01/03/2017   HEPBSAB Reactive 01/03/2017   HEPBIGM NEGATIVE 11/23/2010      Microbiology:  Recent Results (from the past 240 hour(s))  Blood Culture (routine x 2)     Status: None   Collection Time: 01/03/17  8:36 AM  Result Value Ref Range Status   Specimen Description BLOOD BLOOD RIGHT HAND  Final   Special Requests   Final    BOTTLES DRAWN AEROBIC AND ANAEROBIC Blood Culture results may not be optimal due to an inadequate volume of blood received in culture bottles   Culture   Final    NO GROWTH 5 DAYS Performed at [facility], [address], [place], [place] [address]    Report Status 01/08/2017 FINAL  Final  Blood Culture (routine x 2)     Status: None   Collection Time: 01/03/17  8:36 AM  Result Value Ref Range Status   Specimen Description BLOOD BLOOD RIGHT ARM  Final   Special Requests   Final    BOTTLES DRAWN AEROBIC AND ANAEROBIC Blood Culture adequate volume   Culture   Final    NO GROWTH 5 DAYS Performed at [facility], [address] [place], [place] [address]    Report Status 01/08/2017 FINAL  Final  Urine culture     Status: Abnormal   Collection Time: 01/03/17  8:36 AM  Result Value Ref Range Status   Specimen Description   Final    URINE, RANDOM Performed at [facility], [address] [place], [place] [address]    Special Requests   Final    NONE Performed at [facility], [address] [place], [place] [address]    Culture >=100,000 COLONIES/mL ESCHERICHIA COLI (A)  Final   Report Status 01/05/2017 FINAL  Final   Organism ID, Bacteria ESCHERICHIA COLI (A)  Final      Susceptibility   Escherichia coli - MIC*    AMPICILLIN >=32 RESISTANT Resistant     CEFAZOLIN 16 SENSITIVE Sensitive     CEFTRIAXONE <=1 SENSITIVE Sensitive     CIPROFLOXACIN >=4 RESISTANT Resistant     GENTAMICIN >=16 RESISTANT Resistant     IMIPENEM <=0.25  SENSITIVE Sensitive     NITROFURANTOIN 64 INTERMEDIATE Intermediate     TRIMETH/SULFA <=20 SENSITIVE Sensitive     AMPICILLIN/SULBACTAM >=32 RESISTANT Resistant     PIP/TAZO <=4 SENSITIVE Sensitive     Extended ESBL NEGATIVE Sensitive     * >=100,000 COLONIES/mL ESCHERICHIA COLI  MRSA PCR Screening     Status: None   Collection Time: 01/03/17  2:30 PM  Result Value Ref Range Status   MRSA by PCR NEGATIVE NEGATIVE Final    Comment:        The GeneXpert MRSA Assay (FDA approved for NASAL specimens only), is one component of a comprehensive MRSA colonization surveillance program. It is not intended to diagnose MRSA infection nor to guide or monitor treatment for MRSA infections. Performed at [facility], [address] [place],  [address]     Coagulation Studies: No results for input(s): LABPROT, INR in the last 72 hours.  Urinalysis: No results for input(s): COLORURINE, LABSPEC, PHURINE, GLUCOSEU, HGBUR, BILIRUBINUR, KETONESUR, PROTEINUR, UROBILINOGEN, NITRITE, LEUKOCYTESUR in the last 72 hours.  Invalid input(s): APPERANCEUR    Imaging: No results found.   Medications:    . amLODipine  5 mg Oral Daily  . aspirin EC  81 mg Oral QHS  . calcium carbonate  1 tablet Oral TID WC  . cefUROXime  250 mg Oral Q48H  . cloNIDine  0.1 mg Oral BID  . clopidogrel  75 mg Oral Daily  . docusate sodium  100 mg Oral BID  . epoetin (EPOGEN/PROCRIT) injection  4,000 Units Intravenous Q M,W,F-HD  . feeding supplement (ENSURE ENLIVE)  237 mL Oral TID BM  . folic acid  0.5 mg Oral Daily  . Ganciclovir  1 drop Right Eye QID  . heparin  5,000 Units Subcutaneous Q8H  . hydrALAZINE  25 mg Oral Q8H  . ipratropium-albuterol  3 mL Nebulization BID  . levothyroxine  75 mcg Oral Daily  . metoprolol tartrate  50 mg Oral BID  . multivitamin  1 tablet Oral QHS  . nitroGLYCERIN  1 inch  Topical Q6H  . pantoprazole (PROTONIX) IV  40 mg Intravenous Q12H  . pravastatin  40 mg  Oral QPM  . vitamin C  250 mg Oral BID   acetaminophen **OR** acetaminophen, bisacodyl, hydrALAZINE, ondansetron **OR** ondansetron (ZOFRAN) IV, polyvinyl alcohol  Assessment/ Plan:  84 y.o. Caucasian female with end-stage renal disease, history of CABG 2009, history of bilateral knee replacement, aortic valve replacement 2009, diabetes, hypertension, hypothyroidism, peripheral vascular disease, pulmonary hypertension, anxiety Patient is admitted for confusion, hallucinations and altered mental status and is diagnosed with UTI  [facility] nephrology/MWF  1.  ESRD 2.  Altered mental status (patient was living independently and was able to drive prior to admission ) 3.  Urinary tract infection 4.  Acute pulmonary edema 5.  Hypertension 6.  Anemia of chronic kidney disease, hemoglobin 10.7 7.  Secondary hyperparathyroidism 8.  Prolonged bleeding from dialysis access, s/p fistulogram with intervention 01/07/17.  -Patient did have a fistulogram performed yesterday.  This was successful.  Hopefully she will not have any further prolonged bleeding from her dialysis access post decannulation.  Patient due for dialysis today and orders have been prepared.  Ultra filtration target will be 1.7 kg today.  Otherwise plan as per hospitalist.   LOS: 5 Caron Tardif, [facility] 1/4/201910:02 AM  [place], [place]

## 2017-01-08 NOTE — Progress Notes (Signed)
Pt BP 159/43 . Primary nurse notified Dr. Estanislado Pandy. Orders received to Give hydralazine and hold Nitro this AM. Primary nurse to continue to monitor.

## 2017-01-08 NOTE — Progress Notes (Signed)
Pre HD  

## 2017-01-08 NOTE — Progress Notes (Signed)
PT Cancellation Note  Patient Details Name: MISHA VANOVERBEKE MRN: [id number] DOB: 30-Mar-1920   Cancelled Treatment:    Reason Eval/Treat Not Completed: Patient at procedure or test/unavailable. Pt currently at HD and unavailable for therapy at this time. Will re-attempt at another time.   Ilias Stcharles 01/08/2017, 10:38 AM  Greggory Stallion, PT, DPT [phone]

## 2017-01-08 NOTE — Care Management (Signed)
Amanda Morris HD liaison notified of discharge.  

## 2017-01-08 NOTE — Progress Notes (Signed)
HD started. 

## 2017-01-08 NOTE — Discharge Summary (Signed)
[facility] NAME: [name]    MR#:  [id number]  DATE OF BIRTH:  [birth year]  DATE OF ADMISSION:  01/03/2017 ADMITTING PHYSICIAN: Idelle Crouch, MD  DATE OF DISCHARGE: 01/08/2017  PRIMARY CARE PHYSICIAN: [name], MD    ADMISSION DIAGNOSIS:  Acute cystitis without hematuria [B71.69] Acute metabolic encephalopathy [C78.93] Acute on chronic congestive heart failure, unspecified heart failure type ([place]) [I50.9]  DISCHARGE DIAGNOSIS:  Acute metabolic encephalopathy-- Acute E. coli UTI Herpes simplex infection right eye Accelerated hypertension  SECONDARY DIAGNOSIS:   Past Medical History:  Diagnosis Date  . Anemia    NOS  . Anxiety   . Aortic stenosis   . Cancer [facility])    Mole on top of head to be removed in July 2013  . CHF (congestive heart failure) ([place])   . Constipation   . Coronary artery disease   . Diabetes mellitus    type II;was on Glipizide but has been off x 50mon  . Dialysis patient ([place])   . Diarrhea   . GERD (gastroesophageal reflux disease)   . History of blood transfusion   . Hyperlipidemia    takes Simvastatin nightly  . Hypertension    takes Metoprolol daily  . Hypothyroidism    takes Synthroid daily  . Migraine    hx of  . Oligouria   . Osteoarthritis   . Osteopenia   . Peripheral vascular disease ([place])   . Personal history of colonic polyps   . Pulmonary hypertension ([place])   . Renal insufficiency   . Urinary incontinence   . UTI (urinary tract infection)     HOSPITAL COURSE:   84 year old female with end-stage renal disease on hemodialysis who is recently prescribed Valtrex for right eye shingles who presents with shortness of breath and hallucinations.  1. Acute metabolic encephalopathy in the setting ofEscherichia coliUTI and Valtrex This has improved cont oral Ceftin and eye drops -Patient's mentation back to normal  2. End-stage renal disease on  hemodialysis with acute pulmonary edema which is improved:  -Patient will continue dialysis Monday, Wednesday and Friday -Status post fistulogram by Dr. Lucky Cowboy with PTCA of mid upper arm left cephalic vein with 7 mm diameter by 6 cm Lutonix drug-coated angioplasty balloon  3. Recent diagnosis of herpes simplex right eye: Dr Dingledien pt's OPTHALMOLOGIST who RECS changing Valtrex to GENT eye drops given concern for hallucinations and confusion She will need to follow-up after discharge with her ophthalmologist.  4.Acccelerated essential hypertension due to pulmonary edema. -improved BP Continue clonidine, hydralazine, metoprolol, Norvasc   5. Hypothyroid: Continue Synthroid  6. Hyperlipidemia: Continue statin   7. GERD on PPI   8. Chronic diastolic heart failurewith preserved ejection fraction and moderate to severe tricuspid regurgitation: Cardiology consultation appreciated Pulmonary edema was due to end-stage renal disease. Patient will need a follow-up with cardiology after discharge  9. Elevated troponin: Patient is ruled out for ACS. Elevation in troponin is due to demand ischemia and poor renal clearance.  10.  Generalized weakness.  Physical therapy as outpatient.  Recommends rehab.  [facility].  Above was discussed with patient's son who is agreeable  CONSULTS OBTAINED:  Treatment Team:  Murlean Iba, MD Algernon Huxley, MD  DRUG ALLERGIES:   Allergies  Allergen Reactions  . Nitrofurantoin Itching  . Captopril Other (See Comments)    REACTION: unspecified  . Enalapril Maleate Cough  . Ramipril  Other (See Comments)    REACTION: unspecified  . Sulfa Antibiotics Other (See Comments)    Reaction unknown  . Verapamil Other (See Comments)    REACTION: unspecified    DISCHARGE MEDICATIONS:   Allergies as of 01/08/2017      Reactions   Nitrofurantoin Itching   Captopril Other (See Comments)   REACTION:  unspecified   Enalapril Maleate Cough   Ramipril Other (See Comments)   REACTION: unspecified   Sulfa Antibiotics Other (See Comments)   Reaction unknown   Verapamil Other (See Comments)   REACTION: unspecified      Medication List    STOP taking these medications   valACYclovir 500 MG tablet Commonly known as:  VALTREX     TAKE these medications   amLODipine 5 MG tablet Commonly known as:  NORVASC Take 1 tablet (5 mg total) by mouth daily.   ascorbic acid 500 MG tablet Commonly known as:  VITAMIN C Take 1 tablet (500 mg total) by mouth daily.   aspirin EC 81 MG tablet Take 81 mg by mouth at bedtime.   calcium carbonate 500 MG chewable tablet Commonly known as:  TUMS - dosed in mg elemental calcium Chew 1 tablet (200 mg of elemental calcium total) by mouth 3 (three) times daily with meals.   cefUROXime 250 MG tablet Commonly known as:  CEFTIN Take 1 tablet (250 mg total) by mouth every other day for 4 doses.   cloNIDine 0.1 MG tablet Commonly known as:  CATAPRES Take 1 tablet (0.1 mg total) by mouth 2 (two) times daily.   clopidogrel 75 MG tablet Commonly known as:  PLAVIX Take 1 tablet (75 mg total) by mouth daily.   feeding supplement (ENSURE ENLIVE) Liqd Take 237 mLs by mouth 3 (three) times daily between meals.   folic acid 0.5 MG tablet Commonly known as:  FOLVITE Take 0.5 tablets (0.5 mg total) by mouth daily.   Ganciclovir 0.15 % Gel Commonly known as:  ZIRGAN Place 1 drop into the right eye 4 (four) times daily.   hydrALAZINE 25 MG tablet Commonly known as:  APRESOLINE TAKE ONE TABLET BY MOUTH EVERY 8 HOURS.   levothyroxine 75 MCG tablet Commonly known as:  SYNTHROID, LEVOTHROID TAKE ONE TABLET BY MOUTH ONCE DAILY   metoprolol tartrate 50 MG tablet Commonly known as:  LOPRESSOR Take 1 tablet (50 mg total) by mouth 2 (two) times daily.   multivitamin Tabs tablet Take 1 tablet by mouth at bedtime.   polyvinyl alcohol 1.4 % ophthalmic  solution Commonly known as:  LIQUIFILM TEARS Place 1 drop into both eyes as needed for dry eyes.   pravastatin 40 MG tablet Commonly known as:  PRAVACHOL TAKE ONE TABLET BY MOUTH IN THE EVENING       If you experience worsening of your admission symptoms, develop shortness of breath, life threatening emergency, suicidal or homicidal thoughts you must seek medical attention immediately by calling 911 or calling your MD immediately  if symptoms less severe.  You Must read complete instructions/literature along with all the possible adverse reactions/side effects for all the Medicines you take and that have been prescribed to you. Take any new Medicines after you have completely understood and accept all the possible adverse reactions/side effects.   Please note  You were cared for by a hospitalist during your hospital stay. If you have any questions about your discharge medications or the care you received while you were in the hospital after you are discharged, you  can call the unit and asked to speak with the hospitalist on call if the hospitalist that took care of you is not available. Once you are discharged, your primary care physician will handle any further medical issues. Please note that NO REFILLS for any discharge medications will be authorized once you are discharged, as it is imperative that you return to your primary care physician (or establish a relationship with a primary care physician if you do not have one) for your aftercare needs so that they can reassess your need for medications and monitor your lab values. Today   SUBJECTIVE   Patient out sitting in the chair.  She is ready to eat some breakfast.  Son in the room.  Denies any complaints.  VITAL SIGNS:  Blood pressure (!) 149/96, pulse 81, temperature 98.3 F (36.8 C), temperature source Oral, resp. rate (!) 25, height 5\' 2"  (1.575 m), weight 42.5 kg (93 lb 11.1 oz), SpO2 95 %.  I/O:    Intake/Output Summary (Last  24 hours) at 01/08/2017 1154 Last data filed at 01/08/2017 0534 Gross per 24 hour  Intake 47.85 ml  Output -  Net 47.85 ml    PHYSICAL EXAMINATION:  GENERAL:  84 y.o.-year-old patient lying in the bed with no acute distress.  EYES: Pupils equal, round, reactive to light and accommodation. No scleral icterus. Extraocular muscles intact.  HEENT: Head atraumatic, normocephalic. Oropharynx and nasopharynx clear right eye conjunctival injection no discharge.  NECK:  Supple, no jugular venous distention. No thyroid enlargement, no tenderness.  LUNGS: Normal breath sounds bilaterally, no wheezing, rales,rhonchi or crepitation. No use of accessory muscles of respiration.  CARDIOVASCULAR: S1, S2 normal. No murmurs, rubs, or gallops.  ABDOMEN: Soft, non-tender, non-distended. Bowel sounds present. No organomegaly or mass.  EXTREMITIES: No pedal edema, cyanosis, or clubbing.  NEUROLOGIC: Cranial nerves II through XII are intact. Muscle strength 5/5 in all extremities. Sensation intact. Gait not checked.  PSYCHIATRIC: The patient is alert  SKIN: No obvious rash, lesion, or ulcer.   DATA REVIEW:   CBC  Recent Labs  Lab 01/04/17 0117  WBC 7.4  HGB 10.7*  HCT 33.2*  PLT 131*    Chemistries  Recent Labs  Lab 01/04/17 0117 01/05/17 0551 01/07/17 0357  NA 139 138  --   K 3.6 3.8  --   CL 99* 99*  --   CO2 29 28  --   GLUCOSE 117* 81  --   BUN 10 9  --   CREATININE 2.43* 2.16*  --   CALCIUM 8.4* 8.8*  --   MG  --   --  1.9  AST 22  --   --   ALT 8*  --   --   ALKPHOS 103  --   --   BILITOT 1.8*  --   --     Microbiology Results   Recent Results (from the past 240 hour(s))  Blood Culture (routine x 2)     Status: None   Collection Time: 01/03/17  8:36 AM  Result Value Ref Range Status   Specimen Description BLOOD BLOOD RIGHT HAND  Final   Special Requests   Final    BOTTLES DRAWN AEROBIC AND ANAEROBIC Blood Culture results may not be optimal due to an inadequate volume of blood  received in culture bottles   Culture   Final    NO GROWTH 5 DAYS Performed at [facility], [address], [place], [place] [address]    Report  Status 01/08/2017 FINAL  Final  Blood Culture (routine x 2)     Status: None   Collection Time: 01/03/17  8:36 AM  Result Value Ref Range Status   Specimen Description BLOOD BLOOD RIGHT ARM  Final   Special Requests   Final    BOTTLES DRAWN AEROBIC AND ANAEROBIC Blood Culture adequate volume   Culture   Final    NO GROWTH 5 DAYS Performed at [facility], [address] [place], [place] [address]    Report Status 01/08/2017 FINAL  Final  Urine culture     Status: Abnormal   Collection Time: 01/03/17  8:36 AM  Result Value Ref Range Status   Specimen Description   Final    URINE, RANDOM Performed at [facility], [address] [place], [place] [address]    Special Requests   Final    NONE Performed at [facility], [address] [place], [place] [address]    Culture >=100,000 COLONIES/mL ESCHERICHIA COLI (A)  Final   Report Status 01/05/2017 FINAL  Final   Organism ID, Bacteria ESCHERICHIA COLI (A)  Final      Susceptibility   Escherichia coli - MIC*    AMPICILLIN >=32 RESISTANT Resistant     CEFAZOLIN 16 SENSITIVE Sensitive     CEFTRIAXONE <=1 SENSITIVE Sensitive     CIPROFLOXACIN >=4 RESISTANT Resistant     GENTAMICIN >=16 RESISTANT Resistant     IMIPENEM <=0.25 SENSITIVE Sensitive     NITROFURANTOIN 64 INTERMEDIATE Intermediate     TRIMETH/SULFA <=20 SENSITIVE Sensitive     AMPICILLIN/SULBACTAM >=32 RESISTANT Resistant     PIP/TAZO <=4 SENSITIVE Sensitive     Extended ESBL NEGATIVE Sensitive     * >=100,000 COLONIES/mL ESCHERICHIA COLI  MRSA PCR Screening     Status: None   Collection Time: 01/03/17  2:30 PM  Result Value Ref Range Status   MRSA by PCR NEGATIVE NEGATIVE Final    Comment:        The GeneXpert MRSA Assay (FDA approved for NASAL specimens only), is one component  of a comprehensive MRSA colonization surveillance program. It is not intended to diagnose MRSA infection nor to guide or monitor treatment for MRSA infections. Performed at [facility], [address] [place], [place] [address]     RADIOLOGY:  No results found.   Management plans discussed with the patient, family and they are in agreement.  CODE STATUS:     Code Status Orders  (From admission, onward)        Start     Ordered   01/03/17 1329  Do not attempt resuscitation (DNR)  Continuous    Question Answer Comment  In the event of cardiac or respiratory ARREST Do not call a "code blue"   In the event of cardiac or respiratory ARREST Do not perform Intubation, CPR, defibrillation or ACLS   In the event of cardiac or respiratory ARREST Use medication by any route, position, wound care, and other measures to relive pain and suffering. May use oxygen, suction and manual treatment of airway obstruction as needed for comfort.      01/03/17 1328    Code Status History    Date Active Date Inactive Code Status Order ID Comments User Context   01/03/2017 10:31 01/03/2017 13:28 DNR [id number]  Merlyn Lot, MD ED   09/18/2015 18:17 09/22/2015 19:20 DNR [id number]  Fritzi Mandes, MD Inpatient   09/18/2015 15:16 09/18/2015 18:17 DNR [id number]  Flora Lipps,  MD ED   03/22/2013 21:00 03/26/2013 15:40 Full Code [id number]  Rise Patience, MD Inpatient   11/20/2012 17:56 12/06/2012 20:08 DNR [id number]  Annita Brod, MD Inpatient   06/18/2011 10:42 06/19/2011 14:01 Full Code [id number]  Paulene Floor, RN Inpatient   12/14/2010 11:24 12/29/2010 15:43 DNR [id number]  Bonnielee Haff, MD Inpatient   12/13/2010 01:13 12/14/2010 11:23 Full Code [id number]  Sid Falcon, RN Inpatient   11/22/2010 02:16 11/28/2010 22:39 DNR [id number]  Viviano Simas, RN Inpatient    Advance Directive Documentation     Most Recent Value  Type of Advance Directive  Healthcare Power  of Attorney  Pre-existing out of facility DNR order (yellow form or pink MOST form)  No data  "MOST" Form in Place?  No data      TOTAL TIME TAKING CARE OF THIS PATIENT: 40 minutes.    Fritzi Mandes M.D on 01/08/2017 at 11:54 AM  Between 7am to 6pm - Pager - [phone] After 6pm go to [email or web address] - password [email or web address] [facility]  Office  [phone]  CC: Primary care physician; [name], MD

## 2017-01-08 NOTE — Progress Notes (Signed)
Pt discharged per MD order. IV removed. Report called to [facility]. Discharge instructions given to pts son who is driving pt to [facility]. Pt directed to give paperwork to facility upon arrival. Pt is a DNR but son reports her yellow paper to be at home and that he will take it to [facility]. All questions answered to satisfaction. Pt taken to car via wheelchair by staff.

## 2017-01-08 NOTE — Progress Notes (Signed)
HD complete 

## 2017-01-08 NOTE — Clinical Social Work Note (Signed)
CSW has been informed by [place] at [facility] that [facility] has been received and patient can come to them today. Patient's son is insistent that he is going to transport patient to [facility] but is also insistent that he is going to take his mother to her eye doctor prior to stopping at [facility]. He states that the doctor told him to bring her by for an assessment of her herpes in her eye. CSW has advised that patient could have a follow up appointment arranged while patient is at [facility] and that it is not advised he transport her to the eye doctor on the way to [facility].CSW has advised that Discharge information has been provided to [facility]. Shela Leff MSW,LCSW [phone]

## 2017-01-11 DIAGNOSIS — D631 Anemia in chronic kidney disease: Secondary | ICD-10-CM | POA: Diagnosis not present

## 2017-01-11 DIAGNOSIS — N186 End stage renal disease: Secondary | ICD-10-CM | POA: Diagnosis not present

## 2017-01-11 DIAGNOSIS — N2581 Secondary hyperparathyroidism of renal origin: Secondary | ICD-10-CM | POA: Diagnosis not present

## 2017-01-11 DIAGNOSIS — D509 Iron deficiency anemia, unspecified: Secondary | ICD-10-CM | POA: Diagnosis not present

## 2017-01-12 DIAGNOSIS — B0239 Other herpes zoster eye disease: Secondary | ICD-10-CM | POA: Diagnosis not present

## 2017-01-12 DIAGNOSIS — Z8639 Personal history of other endocrine, nutritional and metabolic disease: Secondary | ICD-10-CM | POA: Diagnosis not present

## 2017-01-12 DIAGNOSIS — N390 Urinary tract infection, site not specified: Secondary | ICD-10-CM | POA: Diagnosis not present

## 2017-01-12 DIAGNOSIS — E039 Hypothyroidism, unspecified: Secondary | ICD-10-CM | POA: Diagnosis not present

## 2017-01-13 DIAGNOSIS — D509 Iron deficiency anemia, unspecified: Secondary | ICD-10-CM | POA: Diagnosis not present

## 2017-01-13 DIAGNOSIS — N2581 Secondary hyperparathyroidism of renal origin: Secondary | ICD-10-CM | POA: Diagnosis not present

## 2017-01-13 DIAGNOSIS — N186 End stage renal disease: Secondary | ICD-10-CM | POA: Diagnosis not present

## 2017-01-13 DIAGNOSIS — D631 Anemia in chronic kidney disease: Secondary | ICD-10-CM | POA: Diagnosis not present

## 2017-01-15 DIAGNOSIS — D631 Anemia in chronic kidney disease: Secondary | ICD-10-CM | POA: Diagnosis not present

## 2017-01-15 DIAGNOSIS — D509 Iron deficiency anemia, unspecified: Secondary | ICD-10-CM | POA: Diagnosis not present

## 2017-01-15 DIAGNOSIS — N2581 Secondary hyperparathyroidism of renal origin: Secondary | ICD-10-CM | POA: Diagnosis not present

## 2017-01-15 DIAGNOSIS — N186 End stage renal disease: Secondary | ICD-10-CM | POA: Diagnosis not present

## 2017-01-18 DIAGNOSIS — D631 Anemia in chronic kidney disease: Secondary | ICD-10-CM | POA: Diagnosis not present

## 2017-01-18 DIAGNOSIS — N186 End stage renal disease: Secondary | ICD-10-CM | POA: Diagnosis not present

## 2017-01-18 DIAGNOSIS — D509 Iron deficiency anemia, unspecified: Secondary | ICD-10-CM | POA: Diagnosis not present

## 2017-01-18 DIAGNOSIS — N2581 Secondary hyperparathyroidism of renal origin: Secondary | ICD-10-CM | POA: Diagnosis not present

## 2017-01-20 DIAGNOSIS — N186 End stage renal disease: Secondary | ICD-10-CM | POA: Diagnosis not present

## 2017-01-20 DIAGNOSIS — B0239 Other herpes zoster eye disease: Secondary | ICD-10-CM | POA: Diagnosis not present

## 2017-01-20 DIAGNOSIS — N390 Urinary tract infection, site not specified: Secondary | ICD-10-CM | POA: Diagnosis not present

## 2017-01-20 DIAGNOSIS — D509 Iron deficiency anemia, unspecified: Secondary | ICD-10-CM | POA: Diagnosis not present

## 2017-01-20 DIAGNOSIS — N2581 Secondary hyperparathyroidism of renal origin: Secondary | ICD-10-CM | POA: Diagnosis not present

## 2017-01-20 DIAGNOSIS — D631 Anemia in chronic kidney disease: Secondary | ICD-10-CM | POA: Diagnosis not present

## 2017-01-20 DIAGNOSIS — I2510 Atherosclerotic heart disease of native coronary artery without angina pectoris: Secondary | ICD-10-CM | POA: Diagnosis not present

## 2017-01-22 DIAGNOSIS — N186 End stage renal disease: Secondary | ICD-10-CM | POA: Diagnosis not present

## 2017-01-22 DIAGNOSIS — N2581 Secondary hyperparathyroidism of renal origin: Secondary | ICD-10-CM | POA: Diagnosis not present

## 2017-01-22 DIAGNOSIS — D509 Iron deficiency anemia, unspecified: Secondary | ICD-10-CM | POA: Diagnosis not present

## 2017-01-22 DIAGNOSIS — D631 Anemia in chronic kidney disease: Secondary | ICD-10-CM | POA: Diagnosis not present

## 2017-01-25 DIAGNOSIS — D631 Anemia in chronic kidney disease: Secondary | ICD-10-CM | POA: Diagnosis not present

## 2017-01-25 DIAGNOSIS — N186 End stage renal disease: Secondary | ICD-10-CM | POA: Diagnosis not present

## 2017-01-25 DIAGNOSIS — D509 Iron deficiency anemia, unspecified: Secondary | ICD-10-CM | POA: Diagnosis not present

## 2017-01-25 DIAGNOSIS — N2581 Secondary hyperparathyroidism of renal origin: Secondary | ICD-10-CM | POA: Diagnosis not present

## 2017-01-26 ENCOUNTER — Inpatient Hospital Stay: Payer: Medicare Other | Admitting: Internal Medicine

## 2017-01-26 DIAGNOSIS — Z96653 Presence of artificial knee joint, bilateral: Secondary | ICD-10-CM | POA: Diagnosis not present

## 2017-01-26 DIAGNOSIS — Z471 Aftercare following joint replacement surgery: Secondary | ICD-10-CM | POA: Diagnosis not present

## 2017-01-27 DIAGNOSIS — N2581 Secondary hyperparathyroidism of renal origin: Secondary | ICD-10-CM | POA: Diagnosis not present

## 2017-01-27 DIAGNOSIS — D631 Anemia in chronic kidney disease: Secondary | ICD-10-CM | POA: Diagnosis not present

## 2017-01-27 DIAGNOSIS — D509 Iron deficiency anemia, unspecified: Secondary | ICD-10-CM | POA: Diagnosis not present

## 2017-01-27 DIAGNOSIS — N186 End stage renal disease: Secondary | ICD-10-CM | POA: Diagnosis not present

## 2017-01-29 DIAGNOSIS — N2581 Secondary hyperparathyroidism of renal origin: Secondary | ICD-10-CM | POA: Diagnosis not present

## 2017-01-29 DIAGNOSIS — D631 Anemia in chronic kidney disease: Secondary | ICD-10-CM | POA: Diagnosis not present

## 2017-01-29 DIAGNOSIS — N186 End stage renal disease: Secondary | ICD-10-CM | POA: Diagnosis not present

## 2017-01-29 DIAGNOSIS — D509 Iron deficiency anemia, unspecified: Secondary | ICD-10-CM | POA: Diagnosis not present

## 2017-02-01 DIAGNOSIS — N186 End stage renal disease: Secondary | ICD-10-CM | POA: Diagnosis not present

## 2017-02-01 DIAGNOSIS — N2581 Secondary hyperparathyroidism of renal origin: Secondary | ICD-10-CM | POA: Diagnosis not present

## 2017-02-01 DIAGNOSIS — D509 Iron deficiency anemia, unspecified: Secondary | ICD-10-CM | POA: Diagnosis not present

## 2017-02-01 DIAGNOSIS — D631 Anemia in chronic kidney disease: Secondary | ICD-10-CM | POA: Diagnosis not present

## 2017-02-02 ENCOUNTER — Other Ambulatory Visit: Payer: Self-pay

## 2017-02-02 NOTE — Patient Outreach (Signed)
[facility]) Care Management  02/02/2017  [name] 1920/05/31 [id number]   Telephone Screen  Referral Date: 02/02/17 Referral Source: [facility] High Utilization Report Referral Reason: "HF,DM,HTN,ESRD" Insurance: [facility] Medicare   Outreach attempt # 1 to patient. A female answered but then hung up when asked to speak with patient.     Plan: RN CM will make outreach attempt to patient within three business days.   Enzo Montgomery, RN,BSN,CCM [place] [facility] Telephonic Care Management Coordinator Direct Phone: [phone] Toll Free: [phone] Fax: [phone]

## 2017-02-03 ENCOUNTER — Encounter: Payer: Self-pay | Admitting: Internal Medicine

## 2017-02-03 DIAGNOSIS — D631 Anemia in chronic kidney disease: Secondary | ICD-10-CM | POA: Diagnosis not present

## 2017-02-03 DIAGNOSIS — N186 End stage renal disease: Secondary | ICD-10-CM | POA: Diagnosis not present

## 2017-02-03 DIAGNOSIS — D509 Iron deficiency anemia, unspecified: Secondary | ICD-10-CM | POA: Diagnosis not present

## 2017-02-03 DIAGNOSIS — N2581 Secondary hyperparathyroidism of renal origin: Secondary | ICD-10-CM | POA: Diagnosis not present

## 2017-02-04 ENCOUNTER — Other Ambulatory Visit: Payer: Self-pay

## 2017-02-04 ENCOUNTER — Other Ambulatory Visit: Payer: Self-pay | Admitting: Cardiovascular Disease

## 2017-02-04 DIAGNOSIS — I120 Hypertensive chronic kidney disease with stage 5 chronic kidney disease or end stage renal disease: Secondary | ICD-10-CM | POA: Diagnosis not present

## 2017-02-04 DIAGNOSIS — Z992 Dependence on renal dialysis: Secondary | ICD-10-CM | POA: Diagnosis not present

## 2017-02-04 DIAGNOSIS — N186 End stage renal disease: Secondary | ICD-10-CM | POA: Diagnosis not present

## 2017-02-04 NOTE — Patient Outreach (Signed)
[facility]) Care Management  02/04/2017  [name] 1920-08-29 [id number]   Telephone Screen  Referral Date: 02/02/17 Referral Source: [facility] High Utilization Report Referral Reason: "HF,DM,HTN,ESRD" Insurance: [facility] Medicare    Outreach attempt #2 to patient.Female answered the phone but when asked to speak with patient no response and complete silence. Then call ended.        Plan: RN CM will make outreach attempt to patient within three business days.   Enzo Montgomery, RN,BSN,CCM [place] [facility] Telephonic Care Management Coordinator Direct Phone: [phone] Toll Free: [phone] Fax: [phone]

## 2017-02-05 DIAGNOSIS — D631 Anemia in chronic kidney disease: Secondary | ICD-10-CM | POA: Diagnosis not present

## 2017-02-05 DIAGNOSIS — N2581 Secondary hyperparathyroidism of renal origin: Secondary | ICD-10-CM | POA: Diagnosis not present

## 2017-02-05 DIAGNOSIS — Z992 Dependence on renal dialysis: Secondary | ICD-10-CM | POA: Diagnosis not present

## 2017-02-05 DIAGNOSIS — D509 Iron deficiency anemia, unspecified: Secondary | ICD-10-CM | POA: Diagnosis not present

## 2017-02-05 DIAGNOSIS — I120 Hypertensive chronic kidney disease with stage 5 chronic kidney disease or end stage renal disease: Secondary | ICD-10-CM | POA: Diagnosis not present

## 2017-02-05 DIAGNOSIS — N186 End stage renal disease: Secondary | ICD-10-CM | POA: Diagnosis not present

## 2017-02-08 ENCOUNTER — Other Ambulatory Visit: Payer: Self-pay

## 2017-02-08 ENCOUNTER — Telehealth: Payer: Self-pay | Admitting: Cardiovascular Disease

## 2017-02-08 DIAGNOSIS — N2581 Secondary hyperparathyroidism of renal origin: Secondary | ICD-10-CM | POA: Diagnosis not present

## 2017-02-08 DIAGNOSIS — N186 End stage renal disease: Secondary | ICD-10-CM | POA: Diagnosis not present

## 2017-02-08 DIAGNOSIS — D509 Iron deficiency anemia, unspecified: Secondary | ICD-10-CM | POA: Diagnosis not present

## 2017-02-08 DIAGNOSIS — D631 Anemia in chronic kidney disease: Secondary | ICD-10-CM | POA: Diagnosis not present

## 2017-02-08 NOTE — Patient Outreach (Signed)
[facility]) Care Management  02/08/2017  [name] [birth year] [id number]   Telephone Screen  Referral Date:02/02/17 Referral Source:[employer] High Utilization Report Referral Reason:"HF,DM,HTN,ESRD" Insurance:[employer] Medicare    Outreach attempt #3 to patient. No answer at present and unable to leave message.     Plan: RN CM will send unsuccessful outreach letter to patient and close case if no response within 10 business days.    Enzo Montgomery, RN,BSN,CCM [place] [facility] Telephonic Care Management Coordinator Direct Phone: [phone] Toll Free: [phone] Fax: [phone]

## 2017-02-08 NOTE — Telephone Encounter (Signed)
-----   Message from Janan Ridge, [place] sent at 02/04/2017  4:11 PM EST ----- Regarding: Patient needs an appointment Can you try to schedule an appointment with Dr. Rockey Situ. Past due 6 month follow up. Thanks !

## 2017-02-08 NOTE — Telephone Encounter (Signed)
Lmov for patient to call office and schedule appointment with Dr Rockey Situ

## 2017-02-09 NOTE — Telephone Encounter (Signed)
L MOM to schedule past due f/u appt with Dr. Rockey Situ

## 2017-02-10 DIAGNOSIS — N186 End stage renal disease: Secondary | ICD-10-CM | POA: Diagnosis not present

## 2017-02-10 DIAGNOSIS — N2581 Secondary hyperparathyroidism of renal origin: Secondary | ICD-10-CM | POA: Diagnosis not present

## 2017-02-10 DIAGNOSIS — D509 Iron deficiency anemia, unspecified: Secondary | ICD-10-CM | POA: Diagnosis not present

## 2017-02-10 DIAGNOSIS — D631 Anemia in chronic kidney disease: Secondary | ICD-10-CM | POA: Diagnosis not present

## 2017-02-12 DIAGNOSIS — N186 End stage renal disease: Secondary | ICD-10-CM | POA: Diagnosis not present

## 2017-02-12 DIAGNOSIS — D509 Iron deficiency anemia, unspecified: Secondary | ICD-10-CM | POA: Diagnosis not present

## 2017-02-12 DIAGNOSIS — D631 Anemia in chronic kidney disease: Secondary | ICD-10-CM | POA: Diagnosis not present

## 2017-02-12 DIAGNOSIS — N2581 Secondary hyperparathyroidism of renal origin: Secondary | ICD-10-CM | POA: Diagnosis not present

## 2017-02-15 DIAGNOSIS — N2581 Secondary hyperparathyroidism of renal origin: Secondary | ICD-10-CM | POA: Diagnosis not present

## 2017-02-15 DIAGNOSIS — D631 Anemia in chronic kidney disease: Secondary | ICD-10-CM | POA: Diagnosis not present

## 2017-02-15 DIAGNOSIS — D509 Iron deficiency anemia, unspecified: Secondary | ICD-10-CM | POA: Diagnosis not present

## 2017-02-15 DIAGNOSIS — N186 End stage renal disease: Secondary | ICD-10-CM | POA: Diagnosis not present

## 2017-02-16 ENCOUNTER — Other Ambulatory Visit: Payer: Self-pay | Admitting: Internal Medicine

## 2017-02-17 DIAGNOSIS — D509 Iron deficiency anemia, unspecified: Secondary | ICD-10-CM | POA: Diagnosis not present

## 2017-02-17 DIAGNOSIS — D631 Anemia in chronic kidney disease: Secondary | ICD-10-CM | POA: Diagnosis not present

## 2017-02-17 DIAGNOSIS — N186 End stage renal disease: Secondary | ICD-10-CM | POA: Diagnosis not present

## 2017-02-17 DIAGNOSIS — N2581 Secondary hyperparathyroidism of renal origin: Secondary | ICD-10-CM | POA: Diagnosis not present

## 2017-02-19 DIAGNOSIS — N2581 Secondary hyperparathyroidism of renal origin: Secondary | ICD-10-CM | POA: Diagnosis not present

## 2017-02-19 DIAGNOSIS — D509 Iron deficiency anemia, unspecified: Secondary | ICD-10-CM | POA: Diagnosis not present

## 2017-02-19 DIAGNOSIS — D631 Anemia in chronic kidney disease: Secondary | ICD-10-CM | POA: Diagnosis not present

## 2017-02-19 DIAGNOSIS — N186 End stage renal disease: Secondary | ICD-10-CM | POA: Diagnosis not present

## 2017-02-22 ENCOUNTER — Other Ambulatory Visit: Payer: Self-pay

## 2017-02-22 DIAGNOSIS — N2581 Secondary hyperparathyroidism of renal origin: Secondary | ICD-10-CM | POA: Diagnosis not present

## 2017-02-22 DIAGNOSIS — D631 Anemia in chronic kidney disease: Secondary | ICD-10-CM | POA: Diagnosis not present

## 2017-02-22 DIAGNOSIS — N186 End stage renal disease: Secondary | ICD-10-CM | POA: Diagnosis not present

## 2017-02-22 DIAGNOSIS — D509 Iron deficiency anemia, unspecified: Secondary | ICD-10-CM | POA: Diagnosis not present

## 2017-02-22 NOTE — Patient Outreach (Signed)
[facility]) Care Management  02/22/2017  [name] 21-Nov-1920 [id number]   Telephone Screen  Referral Date:02/02/17 Referral Source:[employer] High Utilization Report Referral Reason:"HF,DM,HTN,ESRD" Insurance:[employer] Medicare      Multiple attempts to establish contact with patient without success. No response from letter mailed to patient. Case is being closed at this time.      Plan: RN CM notify [facility] administrative assistant of case status.   Enzo Montgomery, RN,BSN,CCM [place] Management Telephonic Care Management Coordinator Direct Phone: [phone] Toll Free: [phone] Fax: [phone]

## 2017-02-23 ENCOUNTER — Other Ambulatory Visit: Payer: Self-pay

## 2017-02-23 ENCOUNTER — Encounter: Payer: Medicare Other | Admitting: Internal Medicine

## 2017-02-23 DIAGNOSIS — Z0289 Encounter for other administrative examinations: Secondary | ICD-10-CM

## 2017-02-23 MED ORDER — CLOPIDOGREL BISULFATE 75 MG PO TABS: 75.0000 mg | ORAL_TABLET | Freq: Every day | ORAL | 3 refills | Status: DC

## 2017-02-23 NOTE — Telephone Encounter (Signed)
Pt was scheduled for a MCW and missed it. I called her son and he called me back. She is fine. She cannot hear the phone , so she does not answer it. He will have her appt rescheduled. She needed a refill of Plavix, so I sent that in.

## 2017-02-24 DIAGNOSIS — N186 End stage renal disease: Secondary | ICD-10-CM | POA: Diagnosis not present

## 2017-02-24 DIAGNOSIS — D509 Iron deficiency anemia, unspecified: Secondary | ICD-10-CM | POA: Diagnosis not present

## 2017-02-24 DIAGNOSIS — N2581 Secondary hyperparathyroidism of renal origin: Secondary | ICD-10-CM | POA: Diagnosis not present

## 2017-02-24 DIAGNOSIS — D631 Anemia in chronic kidney disease: Secondary | ICD-10-CM | POA: Diagnosis not present

## 2017-02-26 DIAGNOSIS — D509 Iron deficiency anemia, unspecified: Secondary | ICD-10-CM | POA: Diagnosis not present

## 2017-02-26 DIAGNOSIS — D631 Anemia in chronic kidney disease: Secondary | ICD-10-CM | POA: Diagnosis not present

## 2017-02-26 DIAGNOSIS — N2581 Secondary hyperparathyroidism of renal origin: Secondary | ICD-10-CM | POA: Diagnosis not present

## 2017-02-26 DIAGNOSIS — N186 End stage renal disease: Secondary | ICD-10-CM | POA: Diagnosis not present

## 2017-03-01 DIAGNOSIS — N186 End stage renal disease: Secondary | ICD-10-CM | POA: Diagnosis not present

## 2017-03-01 DIAGNOSIS — D509 Iron deficiency anemia, unspecified: Secondary | ICD-10-CM | POA: Diagnosis not present

## 2017-03-01 DIAGNOSIS — N2581 Secondary hyperparathyroidism of renal origin: Secondary | ICD-10-CM | POA: Diagnosis not present

## 2017-03-01 DIAGNOSIS — D631 Anemia in chronic kidney disease: Secondary | ICD-10-CM | POA: Diagnosis not present

## 2017-03-03 DIAGNOSIS — N186 End stage renal disease: Secondary | ICD-10-CM | POA: Diagnosis not present

## 2017-03-03 DIAGNOSIS — D509 Iron deficiency anemia, unspecified: Secondary | ICD-10-CM | POA: Diagnosis not present

## 2017-03-03 DIAGNOSIS — N2581 Secondary hyperparathyroidism of renal origin: Secondary | ICD-10-CM | POA: Diagnosis not present

## 2017-03-03 DIAGNOSIS — D631 Anemia in chronic kidney disease: Secondary | ICD-10-CM | POA: Diagnosis not present

## 2017-03-04 ENCOUNTER — Ambulatory Visit: Payer: Medicare Other | Admitting: Cardiovascular Disease

## 2017-03-05 DIAGNOSIS — N2581 Secondary hyperparathyroidism of renal origin: Secondary | ICD-10-CM | POA: Diagnosis not present

## 2017-03-05 DIAGNOSIS — D509 Iron deficiency anemia, unspecified: Secondary | ICD-10-CM | POA: Diagnosis not present

## 2017-03-05 DIAGNOSIS — N186 End stage renal disease: Secondary | ICD-10-CM | POA: Diagnosis not present

## 2017-03-05 DIAGNOSIS — I120 Hypertensive chronic kidney disease with stage 5 chronic kidney disease or end stage renal disease: Secondary | ICD-10-CM | POA: Diagnosis not present

## 2017-03-05 DIAGNOSIS — D631 Anemia in chronic kidney disease: Secondary | ICD-10-CM | POA: Diagnosis not present

## 2017-03-05 DIAGNOSIS — E119 Type 2 diabetes mellitus without complications: Secondary | ICD-10-CM | POA: Diagnosis not present

## 2017-03-05 DIAGNOSIS — Z992 Dependence on renal dialysis: Secondary | ICD-10-CM | POA: Diagnosis not present

## 2017-03-08 DIAGNOSIS — D509 Iron deficiency anemia, unspecified: Secondary | ICD-10-CM | POA: Diagnosis not present

## 2017-03-08 DIAGNOSIS — D631 Anemia in chronic kidney disease: Secondary | ICD-10-CM | POA: Diagnosis not present

## 2017-03-08 DIAGNOSIS — N186 End stage renal disease: Secondary | ICD-10-CM | POA: Diagnosis not present

## 2017-03-08 DIAGNOSIS — E119 Type 2 diabetes mellitus without complications: Secondary | ICD-10-CM | POA: Diagnosis not present

## 2017-03-08 DIAGNOSIS — N2581 Secondary hyperparathyroidism of renal origin: Secondary | ICD-10-CM | POA: Diagnosis not present

## 2017-03-09 ENCOUNTER — Encounter: Payer: Self-pay | Admitting: Nurse Practitioner

## 2017-03-09 ENCOUNTER — Ambulatory Visit: Payer: Medicare Other | Admitting: Nurse Practitioner

## 2017-03-10 DIAGNOSIS — N2581 Secondary hyperparathyroidism of renal origin: Secondary | ICD-10-CM | POA: Diagnosis not present

## 2017-03-10 DIAGNOSIS — D509 Iron deficiency anemia, unspecified: Secondary | ICD-10-CM | POA: Diagnosis not present

## 2017-03-10 DIAGNOSIS — E119 Type 2 diabetes mellitus without complications: Secondary | ICD-10-CM | POA: Diagnosis not present

## 2017-03-10 DIAGNOSIS — N186 End stage renal disease: Secondary | ICD-10-CM | POA: Diagnosis not present

## 2017-03-10 DIAGNOSIS — D631 Anemia in chronic kidney disease: Secondary | ICD-10-CM | POA: Diagnosis not present

## 2017-03-12 DIAGNOSIS — D509 Iron deficiency anemia, unspecified: Secondary | ICD-10-CM | POA: Diagnosis not present

## 2017-03-12 DIAGNOSIS — E119 Type 2 diabetes mellitus without complications: Secondary | ICD-10-CM | POA: Diagnosis not present

## 2017-03-12 DIAGNOSIS — N186 End stage renal disease: Secondary | ICD-10-CM | POA: Diagnosis not present

## 2017-03-12 DIAGNOSIS — D631 Anemia in chronic kidney disease: Secondary | ICD-10-CM | POA: Diagnosis not present

## 2017-03-12 DIAGNOSIS — N2581 Secondary hyperparathyroidism of renal origin: Secondary | ICD-10-CM | POA: Diagnosis not present

## 2017-03-14 NOTE — Progress Notes (Signed)
Cardiology Office Note  Date:  03/16/2017   ID:  [name], DOB July 11, 1920, MRN [id number]  PCP:  [name], MD   Chief Complaint  Patient presents with  . OTHER    6 month f/u no complaints today. Meds reviewed verbally with pt.    HPI:   [name] is a very pleasant 84 year old woman with history of  CAD,  severe aortic valve stenosis, bypass surgery x3 vessel in 2009 with bioprosthetic aortic valve  diet controlled diabetes,  previous smoking,  moderate bilateral carotid disease,  end-stage renal disease on hemodialysis 3 days per week who presents for routine followup of her  Coronary artery disease  Lab work reviewed with her on today's visit HBA1C 5.9 Total chol 148, LDL 78  Reports that she is not wearing her hearing aids "Does not want to lose them" Difficulty hearing me on today's evaluation Did not wear her hearing aids on prior office visit either  No recent falls,  Last clinic visit reported having a fall  Hospital evaluation December 2018 with UTI encephalopathy Hospital records reviewed with the patient in detail  Previously completed cardiac rehab Active, no regular exercise program  Carotid ultrasound within the past year, stable  Discussed with her Echocardiogram within the past year  EKG personally reviewed by myself on todays visit Shows normal sinus rhythm with rate 66  bpm poor R-wave progression through the anterior precordial leads,  left axis deviation  Other past medical history reviewed September 2017 she went to dialysis, did not feel well. Sent to the emergency room Diagnosed with UTI,  in hospital 5 days, 9/13th Abn EKG on arrival, troponin normal (checked one time) She spent some time in Rehab 20 days  History of incontinence Total cholesterol 158, hemoglobin A1c 5.9  hemodialysis on March 22 2013 and had syncope. She was taken to the hospital and diagnosed with urinary tract infection with encephalopathy. She was given  antibiotics with improvement of her symptoms. Troponin 0.37 and 0.41. This was felt to be secondary to demand ischemia.  Remote history of MRSA and C. Difficile in December 2014 Echocardiogram in late 2014, well-seated valve   PMH:   has a past medical history of Anemia, Anxiety, Aortic stenosis, Cancer (HCC), Chronic diastolic CHF (congestive heart failure) ([place]), Constipation, Coronary artery disease, Diabetes mellitus, Dialysis patient ([place]), Diarrhea, ESRD (end stage renal disease) ([place]), GERD (gastroesophageal reflux disease), History of blood transfusion, Hyperlipidemia, Hypertension, Hypothyroidism, Migraine, Oligouria, Osteoarthritis, Osteopenia, Peripheral vascular disease ([place] Personal history of colonic polyps, Pulmonary hypertension ([place]), Urinary incontinence, and UTI (urinary tract infection).  PSH:    Past Surgical History:  Procedure Laterality Date  . A/V FISTULAGRAM [name] 01/07/2017   Procedure: A/V Fistulagram;  Surgeon: Algernon Huxley, MD;  Location: [facility];  Service: Cardiovascular;  Laterality: [name];  . A/V SHUNT INTERVENTION [name] 01/07/2017   Procedure: A/V SHUNT INTERVENTION;  Surgeon: Algernon Huxley, MD;  Location: [facility];  Service: Cardiovascular;  Laterality: [name];  . ABDOMINAL HYSTERECTOMY    . adenosine myoview  2007   benign, EF 69%  . AMPUTATION  06/18/2011   Procedure: AMPUTATION DIGIT;  Surgeon: Newt Minion, MD;  Location: [facility];  Service: Orthopedics;  Laterality: Left;  Left 2nd Toe Amputation at MTP Joint  . Amputation-left great toe  7/12   Dr Marlou Sa  . AORTIC VALVE REPLACEMENT  2009  . APPENDECTOMY    . AV FISTULA PLACEMENT  12/22/2010  Procedure: ARTERIOVENOUS (AV) FISTULA CREATION;  Surgeon: Hinda Lenis, MD;  Location: [facility];  Service: Vascular;  Laterality: Left;  Creation of Left Brachial-Cephalic Fistula  . CARDIAC CATHETERIZATION  2000   cad  . CAROTID ENDARTERECTOMY  2011   Right  . CATARACT EXTRACTION    .  CORONARY ARTERY BYPASS GRAFT     od  . CORONARY ARTERY BYPASS GRAFT  2009  . EYE SURGERY     BIlateral  . INSERTION OF DIALYSIS CATHETER  12/18/2010   Procedure: INSERTION OF DIALYSIS CATHETER;  Surgeon: Mal Misty, MD;  Location: [facility];  Service: Vascular;  Laterality: Right;  . REPLACEMENT TOTAL KNEE BILATERAL  05/1998  . TONSILLECTOMY    . Traumatic Amputation of Right DIP joint of Index Finger      Current Outpatient Medications  Medication Sig Dispense Refill  . amLODipine (NORVASC) 5 MG tablet TAKE ONE TABLET BY MOUTH ONCE DAILY 90 tablet 3  . aspirin EC 81 MG tablet Take 81 mg by mouth at bedtime.    . calcium carbonate (TUMS - DOSED IN MG ELEMENTAL CALCIUM) 500 MG chewable tablet Chew 1 tablet (200 mg of elemental calcium total) by mouth 3 (three) times daily with meals.    . cloNIDine (CATAPRES) 0.1 MG tablet TAKE 1 TABLET BY MOUTH TWICE DAILY 180 tablet 3  . clopidogrel (PLAVIX) 75 MG tablet Take 1 tablet (75 mg total) by mouth daily. 90 tablet 3  . feeding supplement, ENSURE ENLIVE, (ENSURE ENLIVE) LIQD Take 237 mLs by mouth 3 (three) times daily between meals. 786 mL 12  . folic acid (FOLVITE) 0.5 MG tablet Take 0.5 tablets (0.5 mg total) by mouth daily.    . Ganciclovir (ZIRGAN) 0.15 % GEL Place 1 drop into the right eye 4 (four) times daily. 5 g 0  . hydrALAZINE (APRESOLINE) 25 MG tablet TAKE ONE TABLET BY MOUTH EVERY 8 HOURS. 90 tablet 0  . levothyroxine (SYNTHROID, LEVOTHROID) 75 MCG tablet TAKE ONE TABLET BY MOUTH ONCE DAILY 30 tablet 11  . metoprolol tartrate (LOPRESSOR) 50 MG tablet TAKE ONE TABLET BY MOUTH TWICE DAILY 60 tablet 0  . multivitamin (RENA-VIT) TABS tablet Take 1 tablet by mouth at bedtime. 30 tablet 0  . polyvinyl alcohol (LIQUIFILM TEARS) 1.4 % ophthalmic solution Place 1 drop into both eyes as needed for dry eyes. 15 mL 0  . pravastatin (PRAVACHOL) 40 MG tablet TAKE ONE TABLET BY MOUTH IN THE EVENING 90 tablet 4  . vitamin C (VITAMIN C) 500 MG tablet  Take 1 tablet (500 mg total) by mouth daily.     No current facility-administered medications for this visit.      Allergies:   Nitrofurantoin; Captopril; Enalapril maleate; Ramipril; Sulfa antibiotics; and Verapamil   Social History:  The patient  reports that she quit smoking about 74 years ago. Her smoking use included cigarettes. She quit after 15.00 years of use. she has never used smokeless tobacco. She reports that she drinks alcohol. She reports that she does not use drugs.   Family History:   Family history is unknown by patient.    Review of Systems: Review of Systems  Constitutional: Negative.   Respiratory: Negative.   Cardiovascular: Negative.   Gastrointestinal: Negative.   Musculoskeletal: Negative.   Neurological: Negative.   Psychiatric/Behavioral: Negative.   All other systems reviewed and are negative.    PHYSICAL EXAM: VS:  BP (!) 120/44 (BP Location: Right Arm, Patient Position: Sitting, Cuff Size: Normal)  Pulse 66   Ht 5\' 3"  (1.6 m)   Wt 102 lb 8 oz (46.5 kg)   BMI 18.16 kg/m  , BMI Body mass index is 18.16 kg/m. Constitutional:  oriented to person, place, and time. No distress. thin, frail HENT:  Head: Normocephalic and atraumatic.  Eyes:  no discharge. No scleral icterus.  Neck: Normal range of motion. Neck supple. No JVD present. + bruit b/l Cardiovascular: Normal rate, regular rhythm, normal heart sounds and intact distal pulses. Exam reveals no gallop and no friction rub.  Trace pitting lower extremity edema bilaterally No murmur heard. Pulmonary/Chest: Effort normal and breath sounds normal. No stridor. No respiratory distress.  no wheezes.  no rales.  no tenderness.  Abdominal: Soft.  no distension.  no tenderness.  Musculoskeletal: Normal range of motion.  no  tenderness or deformity.  Neurological:  normal muscle tone. Coordination normal. No atrophy Skin: Skin is warm and dry. No rash noted. not diaphoretic.  Psychiatric:  normal mood  and affect. behavior is normal. Thought content normal.     Recent Labs: 01/03/2017: TSH 3.873 01/04/2017: ALT 8; Hemoglobin 10.7; Platelets 131 01/05/2017: BUN 9; Creatinine, Ser 2.16; Potassium 3.8; Sodium 138 01/07/2017: Magnesium 1.9    Lipid Panel Lab Results  Component Value Date   CHOL 148 02/13/2016   HDL 52.40 02/13/2016   LDLCALC 78 02/13/2016   TRIG 89.0 02/13/2016      Wt Readings from Last 3 Encounters:  03/16/17 102 lb 8 oz (46.5 kg)  01/08/17 93 lb 11.1 oz (42.5 kg)  12/17/16 98 lb (44.5 kg)       ASSESSMENT AND PLAN:   Essential hypertension - Blood pressure is well controlled on today's visit. No changes made to the medications. Stable  Hyperlipidemia, unspecified hyperlipidemia type -  Cholesterol is at goal on the current lipid regimen. No changes to the medications were made.  No recent lab work available  CAD in native artery  Currently with no symptoms of angina. No further workup at this time. Continue current medication regimen.  Stable  Hx of CABG -   history of coronary artery disease, bypass surgery  Completed heart track, denies any chest pain Previous stress test November 2017 low risk  Falls No recent falls, walks with a walker To person assist getting down from the exam table today  End-stage renal disease on hemodialysis Appears relatively euvolemic, some trace lower extremity edema bilaterally    Total encounter time more than 25 minutes  Greater than 50% was spent in counseling and coordination of care with the patient    Disposition:   F/U  6 months   Orders Placed This Encounter  Procedures  . EKG 12-Lead     Signed, Esmond Plants, M.D., Ph.D. 03/16/2017  [place], [place]

## 2017-03-15 DIAGNOSIS — D509 Iron deficiency anemia, unspecified: Secondary | ICD-10-CM | POA: Diagnosis not present

## 2017-03-15 DIAGNOSIS — E119 Type 2 diabetes mellitus without complications: Secondary | ICD-10-CM | POA: Diagnosis not present

## 2017-03-15 DIAGNOSIS — D631 Anemia in chronic kidney disease: Secondary | ICD-10-CM | POA: Diagnosis not present

## 2017-03-15 DIAGNOSIS — N2581 Secondary hyperparathyroidism of renal origin: Secondary | ICD-10-CM | POA: Diagnosis not present

## 2017-03-15 DIAGNOSIS — N186 End stage renal disease: Secondary | ICD-10-CM | POA: Diagnosis not present

## 2017-03-16 ENCOUNTER — Ambulatory Visit: Payer: Medicare Other | Admitting: Cardiovascular Disease

## 2017-03-16 ENCOUNTER — Encounter: Payer: Self-pay | Admitting: Cardiovascular Disease

## 2017-03-16 VITALS — BP 120/44 | HR 66 | Ht 63.0 in | Wt 102.5 lb

## 2017-03-16 DIAGNOSIS — Z992 Dependence on renal dialysis: Secondary | ICD-10-CM | POA: Diagnosis not present

## 2017-03-16 DIAGNOSIS — I350 Nonrheumatic aortic (valve) stenosis: Secondary | ICD-10-CM

## 2017-03-16 DIAGNOSIS — I25118 Atherosclerotic heart disease of native coronary artery with other forms of angina pectoris: Secondary | ICD-10-CM

## 2017-03-16 DIAGNOSIS — I739 Peripheral vascular disease, unspecified: Secondary | ICD-10-CM

## 2017-03-16 DIAGNOSIS — I6523 Occlusion and stenosis of bilateral carotid arteries: Secondary | ICD-10-CM | POA: Diagnosis not present

## 2017-03-16 DIAGNOSIS — Z952 Presence of prosthetic heart valve: Secondary | ICD-10-CM

## 2017-03-16 DIAGNOSIS — E785 Hyperlipidemia, unspecified: Secondary | ICD-10-CM

## 2017-03-16 DIAGNOSIS — F17200 Nicotine dependence, unspecified, uncomplicated: Secondary | ICD-10-CM | POA: Diagnosis not present

## 2017-03-16 DIAGNOSIS — I10 Essential (primary) hypertension: Secondary | ICD-10-CM | POA: Diagnosis not present

## 2017-03-16 DIAGNOSIS — N186 End stage renal disease: Secondary | ICD-10-CM | POA: Diagnosis not present

## 2017-03-16 DIAGNOSIS — Z951 Presence of aortocoronary bypass graft: Secondary | ICD-10-CM | POA: Diagnosis not present

## 2017-03-16 NOTE — Patient Instructions (Signed)

## 2017-03-17 DIAGNOSIS — N186 End stage renal disease: Secondary | ICD-10-CM | POA: Diagnosis not present

## 2017-03-17 DIAGNOSIS — E119 Type 2 diabetes mellitus without complications: Secondary | ICD-10-CM | POA: Diagnosis not present

## 2017-03-17 DIAGNOSIS — D509 Iron deficiency anemia, unspecified: Secondary | ICD-10-CM | POA: Diagnosis not present

## 2017-03-17 DIAGNOSIS — N2581 Secondary hyperparathyroidism of renal origin: Secondary | ICD-10-CM | POA: Diagnosis not present

## 2017-03-17 DIAGNOSIS — D631 Anemia in chronic kidney disease: Secondary | ICD-10-CM | POA: Diagnosis not present

## 2017-03-18 ENCOUNTER — Ambulatory Visit (INDEPENDENT_AMBULATORY_CARE_PROVIDER_SITE_OTHER): Payer: Medicare Other | Admitting: Orthopedic Surgery

## 2017-03-19 DIAGNOSIS — D509 Iron deficiency anemia, unspecified: Secondary | ICD-10-CM | POA: Diagnosis not present

## 2017-03-19 DIAGNOSIS — N186 End stage renal disease: Secondary | ICD-10-CM | POA: Diagnosis not present

## 2017-03-19 DIAGNOSIS — D631 Anemia in chronic kidney disease: Secondary | ICD-10-CM | POA: Diagnosis not present

## 2017-03-19 DIAGNOSIS — E119 Type 2 diabetes mellitus without complications: Secondary | ICD-10-CM | POA: Diagnosis not present

## 2017-03-19 DIAGNOSIS — N2581 Secondary hyperparathyroidism of renal origin: Secondary | ICD-10-CM | POA: Diagnosis not present

## 2017-03-22 DIAGNOSIS — D509 Iron deficiency anemia, unspecified: Secondary | ICD-10-CM | POA: Diagnosis not present

## 2017-03-22 DIAGNOSIS — N186 End stage renal disease: Secondary | ICD-10-CM | POA: Diagnosis not present

## 2017-03-22 DIAGNOSIS — E119 Type 2 diabetes mellitus without complications: Secondary | ICD-10-CM | POA: Diagnosis not present

## 2017-03-22 DIAGNOSIS — N2581 Secondary hyperparathyroidism of renal origin: Secondary | ICD-10-CM | POA: Diagnosis not present

## 2017-03-22 DIAGNOSIS — D631 Anemia in chronic kidney disease: Secondary | ICD-10-CM | POA: Diagnosis not present

## 2017-03-24 DIAGNOSIS — N2581 Secondary hyperparathyroidism of renal origin: Secondary | ICD-10-CM | POA: Diagnosis not present

## 2017-03-24 DIAGNOSIS — N186 End stage renal disease: Secondary | ICD-10-CM | POA: Diagnosis not present

## 2017-03-24 DIAGNOSIS — D631 Anemia in chronic kidney disease: Secondary | ICD-10-CM | POA: Diagnosis not present

## 2017-03-24 DIAGNOSIS — D509 Iron deficiency anemia, unspecified: Secondary | ICD-10-CM | POA: Diagnosis not present

## 2017-03-24 DIAGNOSIS — E119 Type 2 diabetes mellitus without complications: Secondary | ICD-10-CM | POA: Diagnosis not present

## 2017-03-25 DIAGNOSIS — Z992 Dependence on renal dialysis: Secondary | ICD-10-CM | POA: Diagnosis not present

## 2017-03-25 DIAGNOSIS — N186 End stage renal disease: Secondary | ICD-10-CM | POA: Diagnosis not present

## 2017-03-25 DIAGNOSIS — T82858A Stenosis of vascular prosthetic devices, implants and grafts, initial encounter: Secondary | ICD-10-CM | POA: Diagnosis not present

## 2017-03-25 DIAGNOSIS — I871 Compression of vein: Secondary | ICD-10-CM | POA: Diagnosis not present

## 2017-03-26 DIAGNOSIS — E119 Type 2 diabetes mellitus without complications: Secondary | ICD-10-CM | POA: Diagnosis not present

## 2017-03-26 DIAGNOSIS — D509 Iron deficiency anemia, unspecified: Secondary | ICD-10-CM | POA: Diagnosis not present

## 2017-03-26 DIAGNOSIS — N2581 Secondary hyperparathyroidism of renal origin: Secondary | ICD-10-CM | POA: Diagnosis not present

## 2017-03-26 DIAGNOSIS — D631 Anemia in chronic kidney disease: Secondary | ICD-10-CM | POA: Diagnosis not present

## 2017-03-26 DIAGNOSIS — N186 End stage renal disease: Secondary | ICD-10-CM | POA: Diagnosis not present

## 2017-03-29 DIAGNOSIS — D631 Anemia in chronic kidney disease: Secondary | ICD-10-CM | POA: Diagnosis not present

## 2017-03-29 DIAGNOSIS — N2581 Secondary hyperparathyroidism of renal origin: Secondary | ICD-10-CM | POA: Diagnosis not present

## 2017-03-29 DIAGNOSIS — N186 End stage renal disease: Secondary | ICD-10-CM | POA: Diagnosis not present

## 2017-03-29 DIAGNOSIS — E119 Type 2 diabetes mellitus without complications: Secondary | ICD-10-CM | POA: Diagnosis not present

## 2017-03-29 DIAGNOSIS — D509 Iron deficiency anemia, unspecified: Secondary | ICD-10-CM | POA: Diagnosis not present

## 2017-03-31 DIAGNOSIS — N186 End stage renal disease: Secondary | ICD-10-CM | POA: Diagnosis not present

## 2017-03-31 DIAGNOSIS — N2581 Secondary hyperparathyroidism of renal origin: Secondary | ICD-10-CM | POA: Diagnosis not present

## 2017-03-31 DIAGNOSIS — E119 Type 2 diabetes mellitus without complications: Secondary | ICD-10-CM | POA: Diagnosis not present

## 2017-03-31 DIAGNOSIS — D631 Anemia in chronic kidney disease: Secondary | ICD-10-CM | POA: Diagnosis not present

## 2017-03-31 DIAGNOSIS — D509 Iron deficiency anemia, unspecified: Secondary | ICD-10-CM | POA: Diagnosis not present

## 2017-04-02 DIAGNOSIS — D631 Anemia in chronic kidney disease: Secondary | ICD-10-CM | POA: Diagnosis not present

## 2017-04-02 DIAGNOSIS — N186 End stage renal disease: Secondary | ICD-10-CM | POA: Diagnosis not present

## 2017-04-02 DIAGNOSIS — E119 Type 2 diabetes mellitus without complications: Secondary | ICD-10-CM | POA: Diagnosis not present

## 2017-04-02 DIAGNOSIS — D509 Iron deficiency anemia, unspecified: Secondary | ICD-10-CM | POA: Diagnosis not present

## 2017-04-02 DIAGNOSIS — N2581 Secondary hyperparathyroidism of renal origin: Secondary | ICD-10-CM | POA: Diagnosis not present

## 2017-04-05 DIAGNOSIS — I120 Hypertensive chronic kidney disease with stage 5 chronic kidney disease or end stage renal disease: Secondary | ICD-10-CM | POA: Diagnosis not present

## 2017-04-05 DIAGNOSIS — N186 End stage renal disease: Secondary | ICD-10-CM | POA: Diagnosis not present

## 2017-04-05 DIAGNOSIS — N2581 Secondary hyperparathyroidism of renal origin: Secondary | ICD-10-CM | POA: Diagnosis not present

## 2017-04-05 DIAGNOSIS — Z992 Dependence on renal dialysis: Secondary | ICD-10-CM | POA: Diagnosis not present

## 2017-04-05 DIAGNOSIS — D631 Anemia in chronic kidney disease: Secondary | ICD-10-CM | POA: Diagnosis not present

## 2017-04-07 DIAGNOSIS — N186 End stage renal disease: Secondary | ICD-10-CM | POA: Diagnosis not present

## 2017-04-07 DIAGNOSIS — N2581 Secondary hyperparathyroidism of renal origin: Secondary | ICD-10-CM | POA: Diagnosis not present

## 2017-04-07 DIAGNOSIS — D631 Anemia in chronic kidney disease: Secondary | ICD-10-CM | POA: Diagnosis not present

## 2017-04-09 DIAGNOSIS — N186 End stage renal disease: Secondary | ICD-10-CM | POA: Diagnosis not present

## 2017-04-09 DIAGNOSIS — N2581 Secondary hyperparathyroidism of renal origin: Secondary | ICD-10-CM | POA: Diagnosis not present

## 2017-04-09 DIAGNOSIS — D631 Anemia in chronic kidney disease: Secondary | ICD-10-CM | POA: Diagnosis not present

## 2017-04-12 DIAGNOSIS — N2581 Secondary hyperparathyroidism of renal origin: Secondary | ICD-10-CM | POA: Diagnosis not present

## 2017-04-12 DIAGNOSIS — D631 Anemia in chronic kidney disease: Secondary | ICD-10-CM | POA: Diagnosis not present

## 2017-04-12 DIAGNOSIS — N186 End stage renal disease: Secondary | ICD-10-CM | POA: Diagnosis not present

## 2017-04-14 DIAGNOSIS — N2581 Secondary hyperparathyroidism of renal origin: Secondary | ICD-10-CM | POA: Diagnosis not present

## 2017-04-14 DIAGNOSIS — N186 End stage renal disease: Secondary | ICD-10-CM | POA: Diagnosis not present

## 2017-04-14 DIAGNOSIS — D631 Anemia in chronic kidney disease: Secondary | ICD-10-CM | POA: Diagnosis not present

## 2017-04-16 DIAGNOSIS — N2581 Secondary hyperparathyroidism of renal origin: Secondary | ICD-10-CM | POA: Diagnosis not present

## 2017-04-16 DIAGNOSIS — D631 Anemia in chronic kidney disease: Secondary | ICD-10-CM | POA: Diagnosis not present

## 2017-04-16 DIAGNOSIS — N186 End stage renal disease: Secondary | ICD-10-CM | POA: Diagnosis not present

## 2017-04-19 DIAGNOSIS — D631 Anemia in chronic kidney disease: Secondary | ICD-10-CM | POA: Diagnosis not present

## 2017-04-19 DIAGNOSIS — N186 End stage renal disease: Secondary | ICD-10-CM | POA: Diagnosis not present

## 2017-04-19 DIAGNOSIS — N2581 Secondary hyperparathyroidism of renal origin: Secondary | ICD-10-CM | POA: Diagnosis not present

## 2017-04-21 DIAGNOSIS — N186 End stage renal disease: Secondary | ICD-10-CM | POA: Diagnosis not present

## 2017-04-21 DIAGNOSIS — N2581 Secondary hyperparathyroidism of renal origin: Secondary | ICD-10-CM | POA: Diagnosis not present

## 2017-04-21 DIAGNOSIS — D631 Anemia in chronic kidney disease: Secondary | ICD-10-CM | POA: Diagnosis not present

## 2017-04-23 DIAGNOSIS — N2581 Secondary hyperparathyroidism of renal origin: Secondary | ICD-10-CM | POA: Diagnosis not present

## 2017-04-23 DIAGNOSIS — N186 End stage renal disease: Secondary | ICD-10-CM | POA: Diagnosis not present

## 2017-04-23 DIAGNOSIS — D631 Anemia in chronic kidney disease: Secondary | ICD-10-CM | POA: Diagnosis not present

## 2017-04-26 ENCOUNTER — Emergency Department: Payer: Medicare Other

## 2017-04-26 ENCOUNTER — Other Ambulatory Visit: Payer: Self-pay

## 2017-04-26 ENCOUNTER — Encounter: Payer: Self-pay | Admitting: Emergency Medicine

## 2017-04-26 ENCOUNTER — Observation Stay
Admission: EM | Admit: 2017-04-26 | Discharge: 2017-04-28 | Disposition: A | Discharge status: Skilled Nursing Facility | Payer: Medicare Other | Attending: Internal Medicine | Admitting: Internal Medicine

## 2017-04-26 DIAGNOSIS — W19XXXA Unspecified fall, initial encounter: Secondary | ICD-10-CM | POA: Diagnosis not present

## 2017-04-26 DIAGNOSIS — Z7982 Long term (current) use of aspirin: Secondary | ICD-10-CM | POA: Insufficient documentation

## 2017-04-26 DIAGNOSIS — K219 Gastro-esophageal reflux disease without esophagitis: Secondary | ICD-10-CM | POA: Insufficient documentation

## 2017-04-26 DIAGNOSIS — E785 Hyperlipidemia, unspecified: Secondary | ICD-10-CM | POA: Insufficient documentation

## 2017-04-26 DIAGNOSIS — D631 Anemia in chronic kidney disease: Secondary | ICD-10-CM | POA: Insufficient documentation

## 2017-04-26 DIAGNOSIS — S199XXA Unspecified injury of neck, initial encounter: Secondary | ICD-10-CM | POA: Diagnosis not present

## 2017-04-26 DIAGNOSIS — E1122 Type 2 diabetes mellitus with diabetic chronic kidney disease: Secondary | ICD-10-CM | POA: Diagnosis not present

## 2017-04-26 DIAGNOSIS — I2510 Atherosclerotic heart disease of native coronary artery without angina pectoris: Secondary | ICD-10-CM | POA: Insufficient documentation

## 2017-04-26 DIAGNOSIS — W109XXA Fall (on) (from) unspecified stairs and steps, initial encounter: Secondary | ICD-10-CM | POA: Diagnosis not present

## 2017-04-26 DIAGNOSIS — E039 Hypothyroidism, unspecified: Secondary | ICD-10-CM | POA: Insufficient documentation

## 2017-04-26 DIAGNOSIS — S32591A Other specified fracture of right pubis, initial encounter for closed fracture: Secondary | ICD-10-CM | POA: Diagnosis not present

## 2017-04-26 DIAGNOSIS — Z953 Presence of xenogenic heart valve: Secondary | ICD-10-CM | POA: Insufficient documentation

## 2017-04-26 DIAGNOSIS — Z89422 Acquired absence of other left toe(s): Secondary | ICD-10-CM | POA: Insufficient documentation

## 2017-04-26 DIAGNOSIS — S0003XA Contusion of scalp, initial encounter: Secondary | ICD-10-CM | POA: Diagnosis not present

## 2017-04-26 DIAGNOSIS — G43909 Migraine, unspecified, not intractable, without status migrainosus: Secondary | ICD-10-CM | POA: Insufficient documentation

## 2017-04-26 DIAGNOSIS — E1151 Type 2 diabetes mellitus with diabetic peripheral angiopathy without gangrene: Secondary | ICD-10-CM | POA: Insufficient documentation

## 2017-04-26 DIAGNOSIS — S329XXA Fracture of unspecified parts of lumbosacral spine and pelvis, initial encounter for closed fracture: Secondary | ICD-10-CM | POA: Diagnosis present

## 2017-04-26 DIAGNOSIS — Z86010 Personal history of colonic polyps: Secondary | ICD-10-CM | POA: Diagnosis not present

## 2017-04-26 DIAGNOSIS — I132 Hypertensive heart and chronic kidney disease with heart failure and with stage 5 chronic kidney disease, or end stage renal disease: Secondary | ICD-10-CM | POA: Diagnosis not present

## 2017-04-26 DIAGNOSIS — Z79899 Other long term (current) drug therapy: Secondary | ICD-10-CM | POA: Insufficient documentation

## 2017-04-26 DIAGNOSIS — Y939 Activity, unspecified: Secondary | ICD-10-CM | POA: Insufficient documentation

## 2017-04-26 DIAGNOSIS — Z882 Allergy status to sulfonamides status: Secondary | ICD-10-CM | POA: Insufficient documentation

## 2017-04-26 DIAGNOSIS — S3993XA Unspecified injury of pelvis, initial encounter: Secondary | ICD-10-CM | POA: Diagnosis not present

## 2017-04-26 DIAGNOSIS — M50321 Other cervical disc degeneration at C4-C5 level: Secondary | ICD-10-CM | POA: Insufficient documentation

## 2017-04-26 DIAGNOSIS — E43 Unspecified severe protein-calorie malnutrition: Secondary | ICD-10-CM | POA: Insufficient documentation

## 2017-04-26 DIAGNOSIS — I5032 Chronic diastolic (congestive) heart failure: Secondary | ICD-10-CM | POA: Diagnosis not present

## 2017-04-26 DIAGNOSIS — K5900 Constipation, unspecified: Secondary | ICD-10-CM | POA: Insufficient documentation

## 2017-04-26 DIAGNOSIS — Z96653 Presence of artificial knee joint, bilateral: Secondary | ICD-10-CM | POA: Insufficient documentation

## 2017-04-26 DIAGNOSIS — M1990 Unspecified osteoarthritis, unspecified site: Secondary | ICD-10-CM | POA: Insufficient documentation

## 2017-04-26 DIAGNOSIS — Z87891 Personal history of nicotine dependence: Secondary | ICD-10-CM | POA: Insufficient documentation

## 2017-04-26 DIAGNOSIS — N2581 Secondary hyperparathyroidism of renal origin: Secondary | ICD-10-CM | POA: Insufficient documentation

## 2017-04-26 DIAGNOSIS — S0990XA Unspecified injury of head, initial encounter: Secondary | ICD-10-CM | POA: Diagnosis not present

## 2017-04-26 DIAGNOSIS — Z681 Body mass index (BMI) 19 or less, adult: Secondary | ICD-10-CM | POA: Insufficient documentation

## 2017-04-26 DIAGNOSIS — Z992 Dependence on renal dialysis: Secondary | ICD-10-CM | POA: Insufficient documentation

## 2017-04-26 DIAGNOSIS — Z90710 Acquired absence of both cervix and uterus: Secondary | ICD-10-CM | POA: Insufficient documentation

## 2017-04-26 DIAGNOSIS — I350 Nonrheumatic aortic (valve) stenosis: Secondary | ICD-10-CM | POA: Diagnosis not present

## 2017-04-26 DIAGNOSIS — Z85828 Personal history of other malignant neoplasm of skin: Secondary | ICD-10-CM | POA: Diagnosis not present

## 2017-04-26 DIAGNOSIS — Z794 Long term (current) use of insulin: Secondary | ICD-10-CM | POA: Insufficient documentation

## 2017-04-26 DIAGNOSIS — M25551 Pain in right hip: Secondary | ICD-10-CM | POA: Diagnosis not present

## 2017-04-26 DIAGNOSIS — Z7902 Long term (current) use of antithrombotics/antiplatelets: Secondary | ICD-10-CM | POA: Insufficient documentation

## 2017-04-26 DIAGNOSIS — S0191XA Laceration without foreign body of unspecified part of head, initial encounter: Secondary | ICD-10-CM | POA: Diagnosis not present

## 2017-04-26 DIAGNOSIS — S79911A Unspecified injury of right hip, initial encounter: Secondary | ICD-10-CM | POA: Diagnosis not present

## 2017-04-26 DIAGNOSIS — G9341 Metabolic encephalopathy: Secondary | ICD-10-CM | POA: Insufficient documentation

## 2017-04-26 DIAGNOSIS — I272 Pulmonary hypertension, unspecified: Secondary | ICD-10-CM | POA: Insufficient documentation

## 2017-04-26 DIAGNOSIS — I160 Hypertensive urgency: Secondary | ICD-10-CM | POA: Diagnosis not present

## 2017-04-26 DIAGNOSIS — M8580 Other specified disorders of bone density and structure, unspecified site: Secondary | ICD-10-CM | POA: Insufficient documentation

## 2017-04-26 DIAGNOSIS — Z8673 Personal history of transient ischemic attack (TIA), and cerebral infarction without residual deficits: Secondary | ICD-10-CM | POA: Insufficient documentation

## 2017-04-26 DIAGNOSIS — F419 Anxiety disorder, unspecified: Secondary | ICD-10-CM | POA: Insufficient documentation

## 2017-04-26 DIAGNOSIS — S0101XA Laceration without foreign body of scalp, initial encounter: Secondary | ICD-10-CM | POA: Diagnosis not present

## 2017-04-26 DIAGNOSIS — R188 Other ascites: Secondary | ICD-10-CM | POA: Diagnosis not present

## 2017-04-26 DIAGNOSIS — N186 End stage renal disease: Secondary | ICD-10-CM | POA: Insufficient documentation

## 2017-04-26 DIAGNOSIS — Z951 Presence of aortocoronary bypass graft: Secondary | ICD-10-CM | POA: Insufficient documentation

## 2017-04-26 DIAGNOSIS — Z881 Allergy status to other antibiotic agents status: Secondary | ICD-10-CM | POA: Insufficient documentation

## 2017-04-26 LAB — CBC
HCT: 31.1 % — ABNORMAL LOW (ref 35.0–47.0)
Hemoglobin: 10.4 g/dL — ABNORMAL LOW (ref 12.0–16.0)
MCH: 34.8 pg — AB (ref 26.0–34.0)
MCHC: 33.5 g/dL (ref 32.0–36.0)
MCV: 104 fL — ABNORMAL HIGH (ref 80.0–100.0)
Platelets: 139 10*3/uL — ABNORMAL LOW (ref 150–440)
RBC: 2.99 MIL/uL — AB (ref 3.80–5.20)
RDW: 15.9 % — AB (ref 11.5–14.5)
WBC: 9.6 10*3/uL (ref 3.6–11.0)

## 2017-04-26 LAB — COMPREHENSIVE METABOLIC PANEL
ALT: 12 U/L — AB (ref 14–54)
AST: 25 U/L (ref 15–41)
Albumin: 3.5 g/dL (ref 3.5–5.0)
Alkaline Phosphatase: 159 U/L — ABNORMAL HIGH (ref 38–126)
Anion gap: 11 (ref 5–15)
BUN: 45 mg/dL — ABNORMAL HIGH (ref 6–20)
CO2: 28 mmol/L (ref 22–32)
CREATININE: 4.73 mg/dL — AB (ref 0.44–1.00)
Calcium: 8.9 mg/dL (ref 8.9–10.3)
Chloride: 98 mmol/L — ABNORMAL LOW (ref 101–111)
GFR, EST AFRICAN AMERICAN: 8 mL/min — AB (ref >60)
GFR, EST NON AFRICAN AMERICAN: 7 mL/min — AB (ref >60)
Glucose, Bld: 183 mg/dL — ABNORMAL HIGH (ref 65–99)
POTASSIUM: 3.9 mmol/L (ref 3.5–5.1)
SODIUM: 137 mmol/L (ref 135–145)
Total Bilirubin: 1.1 mg/dL (ref 0.3–1.2)
Total Protein: 7.4 g/dL (ref 6.5–8.1)

## 2017-04-26 LAB — GLUCOSE, CAPILLARY: GLUCOSE-CAPILLARY: 195 mg/dL — AB (ref 65–99)

## 2017-04-26 MED ORDER — ALBUTEROL SULFATE (2.5 MG/3ML) 0.083% IN NEBU: 2.5000 mg | INHALATION_SOLUTION | RESPIRATORY_TRACT | Status: DC | PRN

## 2017-04-26 MED ORDER — ENSURE ENLIVE PO LIQD
237.0000 mL | Freq: Three times a day (TID) | ORAL | Status: DC
  Administered 2017-04-27 – 2017-04-28 (×4): 237 mL via ORAL

## 2017-04-26 MED ORDER — ASPIRIN EC 81 MG PO TBEC
81.0000 mg | DELAYED_RELEASE_TABLET | Freq: Every day | ORAL | Status: DC
  Administered 2017-04-26 – 2017-04-27 (×2): 81 mg via ORAL
  Filled 2017-04-26 (×2): qty 1

## 2017-04-26 MED ORDER — HYDRALAZINE HCL 25 MG PO TABS
25.0000 mg | ORAL_TABLET | Freq: Three times a day (TID) | ORAL | Status: DC
  Administered 2017-04-26 – 2017-04-28 (×6): 25 mg via ORAL
  Filled 2017-04-26 (×6): qty 1

## 2017-04-26 MED ORDER — CLOPIDOGREL BISULFATE 75 MG PO TABS
75.0000 mg | ORAL_TABLET | Freq: Every day | ORAL | Status: DC
  Administered 2017-04-26 – 2017-04-28 (×3): 75 mg via ORAL
  Filled 2017-04-26 (×3): qty 1

## 2017-04-26 MED ORDER — SODIUM CHLORIDE 0.9% FLUSH: 3.0000 mL | INTRAVENOUS | Status: DC | PRN

## 2017-04-26 MED ORDER — LEVOTHYROXINE SODIUM 50 MCG PO TABS
75.0000 ug | ORAL_TABLET | Freq: Every day | ORAL | Status: DC
  Administered 2017-04-27 – 2017-04-28 (×2): 75 ug via ORAL
  Filled 2017-04-26 (×2): qty 1

## 2017-04-26 MED ORDER — ACETAMINOPHEN 650 MG RE SUPP: 650.0000 mg | Freq: Four times a day (QID) | RECTAL | Status: DC | PRN

## 2017-04-26 MED ORDER — METOPROLOL TARTRATE 50 MG PO TABS
50.0000 mg | ORAL_TABLET | Freq: Two times a day (BID) | ORAL | Status: DC
  Administered 2017-04-26 – 2017-04-27 (×2): 50 mg via ORAL
  Filled 2017-04-26 (×3): qty 1

## 2017-04-26 MED ORDER — PRAVASTATIN SODIUM 20 MG PO TABS
40.0000 mg | ORAL_TABLET | Freq: Every evening | ORAL | Status: DC
  Administered 2017-04-26 – 2017-04-27 (×2): 40 mg via ORAL
  Filled 2017-04-26 (×2): qty 2

## 2017-04-26 MED ORDER — BISACODYL 5 MG PO TBEC: 5.0000 mg | DELAYED_RELEASE_TABLET | Freq: Every day | ORAL | Status: DC | PRN

## 2017-04-26 MED ORDER — ONDANSETRON HCL 4 MG PO TABS: 4.0000 mg | ORAL_TABLET | Freq: Four times a day (QID) | ORAL | Status: DC | PRN

## 2017-04-26 MED ORDER — POLYVINYL ALCOHOL 1.4 % OP SOLN
1.0000 [drp] | OPHTHALMIC | Status: DC | PRN
  Administered 2017-04-27 (×2): 1 [drp] via OPHTHALMIC
  Filled 2017-04-26 (×2): qty 15

## 2017-04-26 MED ORDER — AMLODIPINE BESYLATE 5 MG PO TABS
5.0000 mg | ORAL_TABLET | Freq: Every day | ORAL | Status: DC
  Administered 2017-04-26: 5 mg via ORAL
  Filled 2017-04-26 (×2): qty 1

## 2017-04-26 MED ORDER — INSULIN ASPART 100 UNIT/ML ~~LOC~~ SOLN
0.0000 [IU] | Freq: Every day | SUBCUTANEOUS | Status: DC
  Administered 2017-04-27: 2 [IU] via SUBCUTANEOUS
  Filled 2017-04-26 (×2): qty 1

## 2017-04-26 MED ORDER — SODIUM CHLORIDE 0.9 % IV SOLN: 250.0000 mL | INTRAVENOUS | Status: DC | PRN

## 2017-04-26 MED ORDER — ACETAMINOPHEN 325 MG PO TABS: 650.0000 mg | ORAL_TABLET | Freq: Four times a day (QID) | ORAL | Status: DC | PRN

## 2017-04-26 MED ORDER — ONDANSETRON HCL 4 MG/2ML IJ SOLN: 4.0000 mg | Freq: Four times a day (QID) | INTRAMUSCULAR | Status: DC | PRN

## 2017-04-26 MED ORDER — HEPARIN SODIUM (PORCINE) 5000 UNIT/ML IJ SOLN
5000.0000 [IU] | Freq: Three times a day (TID) | INTRAMUSCULAR | Status: DC
  Administered 2017-04-26 – 2017-04-28 (×6): 5000 [IU] via SUBCUTANEOUS
  Filled 2017-04-26 (×6): qty 1

## 2017-04-26 MED ORDER — CALCIUM CARBONATE ANTACID 500 MG PO CHEW
1.0000 | CHEWABLE_TABLET | Freq: Three times a day (TID) | ORAL | Status: DC
  Administered 2017-04-27 – 2017-04-28 (×3): 200 mg via ORAL
  Filled 2017-04-26 (×3): qty 1

## 2017-04-26 MED ORDER — CLONIDINE HCL 0.1 MG PO TABS
0.1000 mg | ORAL_TABLET | Freq: Two times a day (BID) | ORAL | Status: DC
  Administered 2017-04-26 – 2017-04-27 (×2): 0.1 mg via ORAL
  Filled 2017-04-26 (×3): qty 1

## 2017-04-26 MED ORDER — SENNOSIDES-DOCUSATE SODIUM 8.6-50 MG PO TABS: 1.0000 | ORAL_TABLET | Freq: Every evening | ORAL | Status: DC | PRN

## 2017-04-26 MED ORDER — FOLIC ACID 1 MG PO TABS
0.5000 mg | ORAL_TABLET | Freq: Every day | ORAL | Status: DC
  Administered 2017-04-26 – 2017-04-28 (×3): 0.5 mg via ORAL
  Filled 2017-04-26 (×3): qty 1

## 2017-04-26 MED ORDER — INSULIN ASPART 100 UNIT/ML ~~LOC~~ SOLN
0.0000 [IU] | Freq: Three times a day (TID) | SUBCUTANEOUS | Status: DC
  Administered 2017-04-27: 1 [IU] via SUBCUTANEOUS
  Filled 2017-04-26: qty 1

## 2017-04-26 MED ORDER — HYDRALAZINE HCL 20 MG/ML IJ SOLN
10.0000 mg | Freq: Four times a day (QID) | INTRAMUSCULAR | Status: DC | PRN
  Administered 2017-04-26: 10 mg via INTRAVENOUS
  Filled 2017-04-26: qty 1

## 2017-04-26 MED ORDER — HYDROCODONE-ACETAMINOPHEN 5-325 MG PO TABS
1.0000 | ORAL_TABLET | ORAL | Status: DC | PRN
  Administered 2017-04-27: 1 via ORAL
  Filled 2017-04-26: qty 1

## 2017-04-26 MED ORDER — SODIUM CHLORIDE 0.9% FLUSH
3.0000 mL | Freq: Two times a day (BID) | INTRAVENOUS | Status: DC
  Administered 2017-04-26 – 2017-04-28 (×4): 3 mL via INTRAVENOUS

## 2017-04-26 NOTE — ED Notes (Signed)
Attempt to ambulate patient, unable to bear weight R leg, c/o hip pain.

## 2017-04-26 NOTE — ED Notes (Signed)
Bleeding has oozed through pressure dressing, dressing removed, scalp re cleansed, small area stapled by MD.

## 2017-04-26 NOTE — ED Provider Notes (Signed)
[facility] Emergency Department Provider Note   ____________________________________________    I have reviewed the triage vital signs and the nursing notes.   HISTORY  Chief Complaint Fall; Laceration; and Leg Pain     HPI [name] is a 84 y.o. female who presents after a fall.  Apparently the patient fell down several steps and hit the back of her head on cement reportedly per EMS.  Patient does live alone, she remembered the fall, denies dizziness, chest pain or palpitations.  She states she lost her balance.  No neuro deficits.  Denies blood thinners  Past Medical History:  Diagnosis Date  . Anemia    NOS  . Anxiety   . Aortic stenosis   . Cancer [facility])    Mole on top of head to be removed in July 2013  . Chronic diastolic CHF (congestive heart failure) ([place])    a. 01/2017 Echo: EF 60-65%, no rwma, Gr2 DD, mild to mod MR, mildly dil LA/RA, nl RV fxn, Sev TR, PASP 72mmHg.  . Constipation   . Coronary artery disease    a. 2009 CABG x 3 w/ bioprosthetic AVR.  . Diabetes mellitus    type II;was on Glipizide but has been off x 38mon  . Dialysis patient ([place])   . Diarrhea   . ESRD (end stage renal disease) ([place])   . GERD (gastroesophageal reflux disease)   . History of blood transfusion   . Hyperlipidemia    takes Simvastatin nightly  . Hypertension    takes Metoprolol daily  . Hypothyroidism    takes Synthroid daily  . Migraine    hx of  . Oligouria   . Osteoarthritis   . Osteopenia   . Peripheral vascular disease ([place])   . Personal history of colonic polyps   . Pulmonary hypertension ([place])   . Urinary incontinence   . UTI (urinary tract infection)     Patient Active Problem List   Diagnosis Date Noted  . Protein-calorie malnutrition, severe 01/07/2017  . CHF (congestive heart failure) ([place]) 01/03/2017  . UTI (urinary tract infection) 01/03/2017  . Acute metabolic encephalopathy 27/25/3664  . Cerebrovascular accident (CVA)  due to occlusion of right middle cerebral artery ([place]) 10/24/2015  . Malnutrition of mild degree ([place]) 10/24/2015  . Altered mental status 09/18/2015  . Preventative health care 05/03/2014  . Peripheral vascular disease ([place]) 12/21/2013  . Toe amputation status ([place]) 08/29/2013  . History of recent fall 04/18/2013  . HTN (hypertension), malignant 03/22/2013  . S/P aortic valve replacement with bioprosthetic valve 11/15/2012  . S/P CABG x 3 11/15/2012  . Anemia of chronic kidney failure 12/22/2010  . Thrombocytopenia ([place]) 12/13/2010  . End stage renal disease on dialysis ([place]) 12/13/2010  . S/P aortic valve replacement 08/11/2010  . Arterial insufficiency-lower 07/25/2010  . NEURODERMATITIS 04/08/2009  . Carotid arterial disease ([employer]) 01/01/2009  . Coronary atherosclerosis 02/11/2007  . AORTIC STENOSIS 10/04/2006  . ANXIETY 05/30/2006  . Pulmonary hypertension ([place]) 05/30/2006  . Hypothyroidism 05/27/2006  . Type 2 diabetes mellitus with renal manifestations, controlled ([place]) 05/27/2006  . Hyperlipidemia 05/27/2006  . Essential hypertension 05/27/2006  . GERD 05/27/2006  . OSTEOARTHRITIS 05/27/2006  . OSTEOPENIA 05/27/2006  . URINARY INCONTINENCE 05/27/2006    Past Surgical History:  Procedure Laterality Date  . A/V FISTULAGRAM [name] 01/07/2017   Procedure: A/V Fistulagram;  Surgeon: Algernon Huxley, MD;  Location: [facility];  Service: Cardiovascular;  Laterality: [name];  .  A/V SHUNT INTERVENTION [name] 01/07/2017   Procedure: A/V SHUNT INTERVENTION;  Surgeon: Algernon Huxley, MD;  Location: [place] [facility];  Service: Cardiovascular;  Laterality: [name];  . ABDOMINAL HYSTERECTOMY    . adenosine myoview  2007   benign, EF 69%  . AMPUTATION  06/18/2011   Procedure: AMPUTATION DIGIT;  Surgeon: Newt Minion, MD;  Location: [place];  Service: Orthopedics;  Laterality: Left;  Left 2nd Toe Amputation at MTP Joint  . Amputation-left great toe  7/12   Dr Marlou Sa  . AORTIC VALVE REPLACEMENT   2009  . APPENDECTOMY    . AV FISTULA PLACEMENT  12/22/2010   Procedure: ARTERIOVENOUS (AV) FISTULA CREATION;  Surgeon: Hinda Lenis, MD;  Location: [place];  Service: Vascular;  Laterality: Left;  Creation of Left Brachial-Cephalic Fistula  . CARDIAC CATHETERIZATION  2000   cad  . CAROTID ENDARTERECTOMY  2011   Right  . CATARACT EXTRACTION    . CORONARY ARTERY BYPASS GRAFT     od  . CORONARY ARTERY BYPASS GRAFT  2009  . EYE SURGERY     BIlateral  . INSERTION OF DIALYSIS CATHETER  12/18/2010   Procedure: INSERTION OF DIALYSIS CATHETER;  Surgeon: Mal Misty, MD;  Location: [facility];  Service: Vascular;  Laterality: Right;  . REPLACEMENT TOTAL KNEE BILATERAL  05/1998  . TONSILLECTOMY    . Traumatic Amputation of Right DIP joint of Index Finger      Prior to Admission medications   Medication Sig Start Date End Date Taking? Authorizing Provider  amLODipine (NORVASC) 5 MG tablet TAKE ONE TABLET BY MOUTH ONCE DAILY 02/16/17   Viviana Simpler I, MD  aspirin EC 81 MG tablet Take 81 mg by mouth at bedtime.    [provider]  calcium carbonate (TUMS - DOSED IN MG ELEMENTAL CALCIUM) 500 MG chewable tablet Chew 1 tablet (200 mg of elemental calcium total) by mouth 3 (three) times daily with meals. 12/06/12   Geradine Girt, DO  cloNIDine (CATAPRES) 0.1 MG tablet TAKE 1 TABLET BY MOUTH TWICE DAILY 02/16/17   Viviana Simpler I, MD  clopidogrel (PLAVIX) 75 MG tablet Take 1 tablet (75 mg total) by mouth daily. 02/23/17   [name], MD  feeding supplement, ENSURE ENLIVE, (ENSURE ENLIVE) LIQD Take 237 mLs by mouth 3 (three) times daily between meals. 01/08/17   Fritzi Mandes, MD  folic acid (FOLVITE) 0.5 MG tablet Take 0.5 tablets (0.5 mg total) by mouth daily. 12/06/12   Geradine Girt, DO  Ganciclovir (ZIRGAN) 0.15 % GEL Place 1 drop into the right eye 4 (four) times daily. 01/08/17   Fritzi Mandes, MD  hydrALAZINE (APRESOLINE) 25 MG tablet TAKE ONE TABLET BY MOUTH EVERY 8 HOURS. 04/01/16    [name], MD  levothyroxine (SYNTHROID, LEVOTHROID) 75 MCG tablet TAKE ONE TABLET BY MOUTH ONCE DAILY 07/07/16   [name], MD  metoprolol tartrate (LOPRESSOR) 50 MG tablet TAKE ONE TABLET BY MOUTH TWICE DAILY 02/05/17   Minna Merritts, MD  multivitamin (RENA-VIT) TABS tablet Take 1 tablet by mouth at bedtime. 01/08/17   Fritzi Mandes, MD  polyvinyl alcohol (LIQUIFILM TEARS) 1.4 % ophthalmic solution Place 1 drop into both eyes as needed for dry eyes. 09/22/15   Fritzi Mandes, MD  pravastatin (PRAVACHOL) 40 MG tablet TAKE ONE TABLET BY MOUTH IN THE EVENING 07/21/16   Minna Merritts, MD  vitamin C (VITAMIN C) 500 MG tablet Take 1 tablet (500 mg total)  by mouth daily. 12/06/12   Geradine Girt, DO     Allergies Nitrofurantoin; Captopril; Enalapril maleate; Ramipril; Sulfa antibiotics; and Verapamil  Family History  Family history unknown: Yes    Social History Social History   Tobacco Use  . Smoking status: Former Smoker    Years: 15.00    Types: Cigarettes    Last attempt to quit: 01/06/1943    Years since quitting: 74.3  . Smokeless tobacco: Never Used  Substance Use Topics  . Alcohol use: Yes    Comment: Wine occasionally  . Drug use: No    Review of Systems  Constitutional: No dizziness Eyes: No visual changes.  ENT: No neck pain Cardiovascular: Denies chest pain. Respiratory: Denies shortness of breath. Gastrointestinal: No abdominal pain.    Genitourinary: Negative for groin injury Musculoskeletal: Negative for back pain. Skin: Bleeding scalp Neurological: Negative for weakness   ____________________________________________   PHYSICAL EXAM:  VITAL SIGNS: ED Triage Vitals  Enc Vitals Group     BP 04/26/17 0913 (!) 182/86     Pulse Rate 04/26/17 0913 82     Resp 04/26/17 0913 20     Temp 04/26/17 0913 97.8 F (36.6 C)     Temp src --      SpO2 04/26/17 0913 96 %     Weight 04/26/17 0915 46.3 kg (102 lb)     Height 04/26/17 0915 1.6 m (5\' 3" )       Head Circumference --      Peak Flow --      Pain Score --      Pain Loc --      Pain Edu? --      Excl. in [place]? --     Constitutional: Alert and oriented.  Hard of hearing Eyes: Conjunctivae are normal.  PERRLA Head: Large boggy hematoma posterior scalp with significant blood clots entangled with the hair, after copious irrigation approximately 5 cm horizontal laceration mid hematoma with active bleeding Nose: No swelling or epistaxis Mouth/Throat: Mucous membranes are moist.   Neck:  Painless ROM no vertebral tenderness palpation Cardiovascular: Normal rate, regular rhythm.  Good peripheral circulation. Respiratory: Normal respiratory effort.  No retractions. Lungs CTAB. Gastrointestinal: Soft and nontender. No distention.   Genitourinary: deferred Musculoskeletal: No pain with axial load on both hips, able to range her lower extremities without difficulty.  Full range of motion of upper extremities as well.  No clavicular tenderness.  No vertebral tenderness to palpation.  Warm and well perfused Neurologic:  Normal speech and language. No gross focal neurologic deficits are appreciated.  Skin:  Skin is warm, dry. Psychiatric: Mood and affect are normal. Speech and behavior are normal.  ____________________________________________   LABS (all labs ordered are listed, but only abnormal results are displayed)  Labs Reviewed  CBC - Abnormal; Notable for the following components:      Result Value   RBC 2.99 (*)    Hemoglobin 10.4 (*)    HCT 31.1 (*)    MCV 104.0 (*)    MCH 34.8 (*)    RDW 15.9 (*)    Platelets 139 (*)    All other components within normal limits  COMPREHENSIVE METABOLIC PANEL - Abnormal; Notable for the following components:   Chloride 98 (*)    Glucose, Bld 183 (*)    BUN 45 (*)    Creatinine, Ser 4.73 (*)    ALT 12 (*)    Alkaline Phosphatase 159 (*)    GFR calc  non Af Amer 7 (*)    GFR calc Af Amer 8 (*)    All other components within normal  limits   ____________________________________________  EKG  None ____________________________________________  RADIOLOGY  CT head unremarkable  CT cervical spine no acute distress Pelvis x-ray no evidence of fracture ____________________________________________   PROCEDURES  Procedure(s) performed: yes  .Marland KitchenLaceration Repair Date/Time: 04/26/2017 11:14 AM Performed by: Lavonia Drafts, MD Authorized by: Lavonia Drafts, MD   Consent:    Consent obtained:  Verbal   Consent given by:  Patient   Risks discussed:  Infection, pain, retained foreign body and poor wound healing Anesthesia (see MAR for exact dosages):    Anesthesia method:  None Laceration details:    Length (cm):  5 Repair type:    Repair type:  Intermediate Exploration:    Hemostasis achieved with:  Direct pressure   Wound exploration: entire depth of wound probed and visualized     Contaminated: no   Treatment:    Area cleansed with:  Saline   Amount of cleaning:  Extensive   Irrigation solution:  Sterile saline   Visualized foreign bodies/material removed: no   Skin repair:    Repair method:  Sutures and staples   Number of staples:  11 Approximation:    Approximation:  Close Post-procedure details:    Dressing:  Sterile dressing and bulky dressing   Patient tolerance of procedure:  Tolerated well, no immediate complications     Critical Care performed: No ____________________________________________   INITIAL IMPRESSION / ASSESSMENT AND PLAN / ED COURSE  Pertinent labs & imaging results that were available during my care of the patient were reviewed by me and considered in my medical decision making (see chart for details).  Patient presents after fall with head injury.  Active bleeding upon arrival with gauze soaked with blood.  After extensive cleansing and irrigation isolated bleeding to large horizontal laceration in the center of the hematoma to the scalp.  Closed with 11 staples with  good control of bleeding.  CT head and cervical spine reassuring  X-rays  negative for fracture, however patient unable to bear weight and complains of pain in her right hip.    ----------------------------------------- 1:51 PM on 04/26/2017 -----------------------------------------  CT demonstrates pubic ramus fracture, discussed with hospitalist for admission   ____________________________________________   FINAL CLINICAL IMPRESSION(S) / ED DIAGNOSES  Final diagnoses:  Injury of head, initial encounter  Scalp laceration, initial encounter  Closed nondisplaced fracture of pelvis, unspecified part of pelvis, initial encounter [facility])        Note:  This document was prepared using Dragon voice recognition software and may include unintentional dictation errors.    Lavonia Drafts, MD 04/26/17 1351

## 2017-04-26 NOTE — H&P (Signed)
[facility] [place] NAME: [name]    MR#:  [id number]  DATE OF BIRTH:  February 18, 1920  DATE OF ADMISSION:  04/26/2017  PRIMARY CARE PHYSICIAN: [name], MD   REQUESTING/REFERRING PHYSICIAN: Dr. Corky Downs.  CHIEF COMPLAINT:   Chief Complaint  Patient presents with  . Fall  . Laceration  . Leg Pain   Fell today HISTORY OF PRESENT ILLNESS:  [name]  is a 84 y.o. female with a known history of multiple medical problems as below.  The patient was sent to ED from home due to fall.  The patient fell down several steps and hit the back of the head per EMS.  She denies any syncope episode or loss of consciousness.  She said she lost her balance.  She got head laceration which was sutured by Dr. Corky Downs.  X-ray show pelvic fracture.  Blood pressure is high at 206/60. PAST MEDICAL HISTORY:   Past Medical History:  Diagnosis Date  . Anemia    NOS  . Anxiety   . Aortic stenosis   . Cancer [facility])    Mole on top of head to be removed in July 2013  . Chronic diastolic CHF (congestive heart failure) ([place])    a. 01/2017 Echo: EF 60-65%, no rwma, Gr2 DD, mild to mod MR, mildly dil LA/RA, nl RV fxn, Sev TR, PASP 6mmHg.  . Constipation   . Coronary artery disease    a. 2009 CABG x 3 w/ bioprosthetic AVR.  . Diabetes mellitus    type II;was on Glipizide but has been off x 80mon  . Dialysis patient ([place])   . Diarrhea   . ESRD (end stage renal disease) ([place])   . GERD (gastroesophageal reflux disease)   . History of blood transfusion   . Hyperlipidemia    takes Simvastatin nightly  . Hypertension    takes Metoprolol daily  . Hypothyroidism    takes Synthroid daily  . Migraine    hx of  . Oligouria   . Osteoarthritis   . Osteopenia   . Peripheral vascular disease ([place])   . Personal history of colonic polyps   . Pulmonary hypertension ([place])   . Urinary incontinence   . UTI (urinary tract infection)     PAST SURGICAL HISTORY:    Past Surgical History:  Procedure Laterality Date  . A/V FISTULAGRAM [name] 01/07/2017   Procedure: A/V Fistulagram;  Surgeon: Algernon Huxley, MD;  Location: [place] [facility];  Service: Cardiovascular;  Laterality: [name];  . A/V SHUNT INTERVENTION [name] 01/07/2017   Procedure: A/V SHUNT INTERVENTION;  Surgeon: Algernon Huxley, MD;  Location: [place] [facility];  Service: Cardiovascular;  Laterality: [name];  . ABDOMINAL HYSTERECTOMY    . adenosine myoview  2007   benign, EF 69%  . AMPUTATION  06/18/2011   Procedure: AMPUTATION DIGIT;  Surgeon: Newt Minion, MD;  Location: [facility];  Service: Orthopedics;  Laterality: Left;  Left 2nd Toe Amputation at MTP Joint  . Amputation-left great toe  7/12   Dr Marlou Sa  . AORTIC VALVE REPLACEMENT  2009  . APPENDECTOMY    . AV FISTULA PLACEMENT  12/22/2010   Procedure: ARTERIOVENOUS (AV) FISTULA CREATION;  Surgeon: Hinda Lenis, MD;  Location: [facility];  Service: Vascular;  Laterality: Left;  Creation of Left Brachial-Cephalic Fistula  . CARDIAC CATHETERIZATION  2000   cad  . CAROTID ENDARTERECTOMY  2011   Right  . CATARACT  EXTRACTION    . CORONARY ARTERY BYPASS GRAFT     od  . CORONARY ARTERY BYPASS GRAFT  2009  . EYE SURGERY     BIlateral  . INSERTION OF DIALYSIS CATHETER  12/18/2010   Procedure: INSERTION OF DIALYSIS CATHETER;  Surgeon: Mal Misty, MD;  Location: [facility];  Service: Vascular;  Laterality: Right;  . REPLACEMENT TOTAL KNEE BILATERAL  05/1998  . TONSILLECTOMY    . Traumatic Amputation of Right DIP joint of Index Finger      SOCIAL HISTORY:   Social History   Tobacco Use  . Smoking status: Former Smoker    Years: 15.00    Types: Cigarettes    Last attempt to quit: 01/06/1943    Years since quitting: 74.3  . Smokeless tobacco: Never Used  Substance Use Topics  . Alcohol use: Yes    Comment: Wine occasionally    FAMILY HISTORY:   Family History  Family history unknown: Yes  The patient does not know.  DRUG ALLERGIES:    Allergies  Allergen Reactions  . Nitrofurantoin Itching  . Captopril Other (See Comments)    REACTION: unspecified  . Enalapril Maleate Cough  . Ramipril Other (See Comments)    REACTION: unspecified  . Sulfa Antibiotics Other (See Comments)    Reaction unknown  . Verapamil Other (See Comments)    REACTION: unspecified    REVIEW OF SYSTEMS:   Review of Systems  Constitutional: Negative for chills, fever and malaise/fatigue.  HENT: Negative for sore throat.   Eyes: Negative for blurred vision and double vision.  Respiratory: Negative for cough, hemoptysis, shortness of breath, wheezing and stridor.   Cardiovascular: Negative for chest pain, palpitations, orthopnea and leg swelling.  Gastrointestinal: Negative for abdominal pain, blood in stool, diarrhea, melena, nausea and vomiting.  Genitourinary: Negative for dysuria, flank pain and hematuria.  Musculoskeletal: Positive for joint pain. Negative for back pain.  Skin: Negative for rash.  Neurological: Positive for headaches. Negative for dizziness, sensory change, focal weakness, seizures, loss of consciousness and weakness.  Endo/Heme/Allergies: Negative for polydipsia.  Psychiatric/Behavioral: Negative for depression. The patient is not nervous/anxious.     MEDICATIONS AT HOME:   Prior to Admission medications   Medication Sig Start Date End Date Taking? Authorizing Provider  amLODipine (NORVASC) 5 MG tablet TAKE ONE TABLET BY MOUTH ONCE DAILY 02/16/17  Yes Viviana Simpler I, MD  aspirin EC 81 MG tablet Take 81 mg by mouth at bedtime.   Yes [provider]  calcium carbonate (TUMS - DOSED IN MG ELEMENTAL CALCIUM) 500 MG chewable tablet Chew 1 tablet (200 mg of elemental calcium total) by mouth 3 (three) times daily with meals. 12/06/12  Yes Vann, Jessica U, DO  cloNIDine (CATAPRES) 0.1 MG tablet TAKE 1 TABLET BY MOUTH TWICE DAILY 02/16/17  Yes [name], MD  clopidogrel (PLAVIX) 75 MG tablet Take 1 tablet (75  mg total) by mouth daily. 02/23/17  Yes [name], MD  feeding supplement, ENSURE ENLIVE, (ENSURE ENLIVE) LIQD Take 237 mLs by mouth 3 (three) times daily between meals. 01/08/17  Yes Fritzi Mandes, MD  folic acid (FOLVITE) 0.5 MG tablet Take 0.5 tablets (0.5 mg total) by mouth daily. 12/06/12  Yes Vann, Jessica U, DO  hydrALAZINE (APRESOLINE) 25 MG tablet TAKE ONE TABLET BY MOUTH EVERY 8 HOURS. 04/01/16  Yes [name], MD  levothyroxine (SYNTHROID, LEVOTHROID) 75 MCG tablet TAKE ONE TABLET BY MOUTH ONCE DAILY 07/07/16  Yes [name],  MD  metoprolol tartrate (LOPRESSOR) 50 MG tablet TAKE ONE TABLET BY MOUTH TWICE DAILY 02/05/17  Yes Gollan, Kathlene November, MD  multivitamin (RENA-VIT) TABS tablet Take 1 tablet by mouth at bedtime. 01/08/17  Yes Fritzi Mandes, MD  polyvinyl alcohol (LIQUIFILM TEARS) 1.4 % ophthalmic solution Place 1 drop into both eyes as needed for dry eyes. 09/22/15  Yes Fritzi Mandes, MD  pravastatin (PRAVACHOL) 40 MG tablet TAKE ONE TABLET BY MOUTH IN THE EVENING 07/21/16  Yes Gollan, Kathlene November, MD  vitamin C (VITAMIN C) 500 MG tablet Take 1 tablet (500 mg total) by mouth daily. 12/06/12  Yes Geradine Girt, DO  Ganciclovir (ZIRGAN) 0.15 % GEL Place 1 drop into the right eye 4 (four) times daily. 01/08/17   Fritzi Mandes, MD      VITAL SIGNS:  Blood pressure (!) 192/67, pulse 91, temperature 97.8 F (36.6 C), resp. rate 18, height 5\' 3"  (1.6 m), weight 102 lb (46.3 kg), SpO2 95 %.  PHYSICAL EXAMINATION:  Physical Exam  GENERAL:  84 y.o.-year-old patient lying in the bed with no acute distress.  EYES: Pupils equal, round, reactive to light and accommodation. No scleral icterus. Extraocular muscles intact.  HEENT: Head laceration in dressing. Oropharynx and nasopharynx clear.  NECK:  Supple, no jugular venous distention. No thyroid enlargement, no tenderness.  LUNGS: Normal breath sounds bilaterally, no wheezing, rales,rhonchi or crepitation. No use of accessory muscles of  respiration.  CARDIOVASCULAR: S1, S2 normal. No murmurs, rubs, or gallops.  ABDOMEN: Soft, nontender, nondistended. Bowel sounds present. No organomegaly or mass.  EXTREMITIES: No pedal edema, cyanosis, or clubbing.  unble to lower extremities.  NEUROLOGIC: Cranial nerves II through XII are intact.  unble to lower extremities. Sensation intact. Gait not checked.  PSYCHIATRIC: The patient is alert and oriented x 3.  SKIN: No obvious rash, lesion, or ulcer.   LABORATORY PANEL:   CBC Recent Labs  Lab 04/26/17 0936  WBC 9.6  HGB 10.4*  HCT 31.1*  PLT 139*   ------------------------------------------------------------------------------------------------------------------  Chemistries  Recent Labs  Lab 04/26/17 0936  NA 137  K 3.9  CL 98*  CO2 28  GLUCOSE 183*  BUN 45*  CREATININE 4.73*  CALCIUM 8.9  AST 25  ALT 12*  ALKPHOS 159*  BILITOT 1.1   ------------------------------------------------------------------------------------------------------------------  Cardiac Enzymes No results for input(s): TROPONINI in the last 168 hours. ------------------------------------------------------------------------------------------------------------------  RADIOLOGY:  Dg Pelvis 1-2 Views  Result Date: 04/26/2017 CLINICAL DATA:  Fall today. EXAM: PELVIS - 1-2 VIEW COMPARISON:  Abdominal radiograph 11/26/2012 FINDINGS: No visible fracture or pelvic diastasis. Limited over the central pelvis due to gas and stool. Osteopenia. Extensive atherosclerotic calcification. Partially visualized lumbar scoliosis. IMPRESSION: 1. No acute finding. 2. If focal hip pain dedicated images are recommended. Electronically Signed   By: Monte Fantasia M.D.   On: 04/26/2017 09:52   Ct Head Wo Contrast  Result Date: 04/26/2017 CLINICAL DATA:  Head laceration after fall. EXAM: CT HEAD WITHOUT CONTRAST CT CERVICAL SPINE WITHOUT CONTRAST TECHNIQUE: Multidetector CT imaging of the head and cervical spine was  performed following the standard protocol without intravenous contrast. Multiplanar CT image reconstructions of the cervical spine were also generated. COMPARISON:  CT scan of January 03, 2017. FINDINGS: CT HEAD FINDINGS Brain: Mild diffuse cortical atrophy is noted. Mild chronic ischemic white matter disease is noted. Old right parietal infarction is noted which is unchanged. No mass effect or midline shift is noted. Ventricular size is within normal limits. There is no evidence  of mass lesion, hemorrhage or acute infarction. Vascular: No hyperdense vessel or unexpected calcification. Skull: Normal. Negative for fracture or focal lesion. Sinuses/Orbits: No acute finding. Other: Large posterior scalp hematoma is noted. CT CERVICAL SPINE FINDINGS Alignment: Minimal grade 1 anterolisthesis of C6-7 is noted secondary to posterior facet joint hypertrophy. Skull base and vertebrae: No acute fracture. No primary bone lesion or focal pathologic process. Soft tissues and spinal canal: Mild bilateral apical pleural calcifications and pleural thickening is noted. Disc levels: Severe degenerative disc disease is noted at C4-5, C5-6 and C6-7. Upper chest: Biapical pleural calcifications and pleural thickening is noted. Other: Degenerative changes are seen involving the posterior facet joints bilaterally. IMPRESSION: Mild diffuse cortical atrophy. Mild chronic ischemic white matter disease. Large posterior scalp hematoma. No acute intracranial abnormality seen. Severe multilevel degenerative disc disease. No acute abnormality seen in the cervical spine. Electronically Signed   By: Marijo Conception, M.D.   On: 04/26/2017 09:44   Ct Cervical Spine Wo Contrast  Result Date: 04/26/2017 CLINICAL DATA:  Head laceration after fall. EXAM: CT HEAD WITHOUT CONTRAST CT CERVICAL SPINE WITHOUT CONTRAST TECHNIQUE: Multidetector CT imaging of the head and cervical spine was performed following the standard protocol without intravenous  contrast. Multiplanar CT image reconstructions of the cervical spine were also generated. COMPARISON:  CT scan of January 03, 2017. FINDINGS: CT HEAD FINDINGS Brain: Mild diffuse cortical atrophy is noted. Mild chronic ischemic white matter disease is noted. Old right parietal infarction is noted which is unchanged. No mass effect or midline shift is noted. Ventricular size is within normal limits. There is no evidence of mass lesion, hemorrhage or acute infarction. Vascular: No hyperdense vessel or unexpected calcification. Skull: Normal. Negative for fracture or focal lesion. Sinuses/Orbits: No acute finding. Other: Large posterior scalp hematoma is noted. CT CERVICAL SPINE FINDINGS Alignment: Minimal grade 1 anterolisthesis of C6-7 is noted secondary to posterior facet joint hypertrophy. Skull base and vertebrae: No acute fracture. No primary bone lesion or focal pathologic process. Soft tissues and spinal canal: Mild bilateral apical pleural calcifications and pleural thickening is noted. Disc levels: Severe degenerative disc disease is noted at C4-5, C5-6 and C6-7. Upper chest: Biapical pleural calcifications and pleural thickening is noted. Other: Degenerative changes are seen involving the posterior facet joints bilaterally. IMPRESSION: Mild diffuse cortical atrophy. Mild chronic ischemic white matter disease. Large posterior scalp hematoma. No acute intracranial abnormality seen. Severe multilevel degenerative disc disease. No acute abnormality seen in the cervical spine. Electronically Signed   By: Marijo Conception, M.D.   On: 04/26/2017 09:44   Ct Hip Right Wo Contrast  Result Date: 04/26/2017 CLINICAL DATA:  Right hip pain secondary to a fall on 04/26/2017. EXAM: CT OF THE RIGHT HIP WITHOUT CONTRAST TECHNIQUE: Multidetector CT imaging of the right hip was performed according to the standard protocol. Multiplanar CT image reconstructions were also generated. COMPARISON:  Radiographs dated 04/26/2017  FINDINGS: Bones/Joint/Cartilage There is a subtle nondisplaced fracture of the right inferior pubic ramus. Superior pubic ramus and acetabulum are intact. Proximal left femur is intact. Arthritic changes of the superior aspect of the right acetabulum with prominent subcortical cysts and cortical defects in the roof of the acetabulum. Muscles and Tendons Normal. Soft tissues Moderate ascites in the pelvis of unknown etiology. IMPRESSION: 1. Nondisplaced fracture of right inferior pubic ramus. 2. Intact proximal right femur. 3. Arthritic changes of the right acetabulum. 4. Nonspecific ascites. Electronically Signed   By: Lorriane Shire M.D.   On: 04/26/2017  12:57   Dg Hip Unilat With Pelvis 2-3 Views Right  Result Date: 04/26/2017 CLINICAL DATA:  Patient reports falling down 5 DIS 6 steps striking a cement surface. EXAM: DG HIP (WITH OR WITHOUT PELVIS) 2-3V RIGHT COMPARISON:  AP view of the pelvis of today's date FINDINGS: The bones are subjectively osteopenic. The right hip joint space is well maintained. The articular surfaces of the right femoral head and acetabulum remains smoothly rounded. The femoral neck, intertrochanteric, and subtrochanteric regions are normal. IMPRESSION: There is no acute fracture of the right hip. Electronically Signed   By: David  Martinique M.D.   On: 04/26/2017 12:01      IMPRESSION AND PLAN:   Pelvic fracture The patient will be placed for observation, pain control.  PT PT evaluation, fall precaution.  Hypertension urgency. To new home hypertension medication, IV hydralazine as needed.  Head laceration and Large posterior scalp hematoma, status post suture.  ESRD.  Continue scheduled hemodialysis, nephrology consult. CAD.  Continue aspirin, Plavix and statin. I called the patient's son Mr. Tyese Finken, nobody answered the phone. All the records are reviewed and case discussed with ED provider. Management plans discussed with the patient, family and they are in  agreement.  CODE STATUS: DNR  TOTAL TIME TAKING CARE OF THIS PATIENT: 46 minutes.    Demetrios Loll M.D on 04/26/2017 at 2:55 PM  Between 7am to 6pm - Pager - [phone]  After 6pm go to [email or web address] - Proofreader  [facility]  Office  [phone]  CC: Primary care physician; [name], MD   Note: This dictation was prepared with Dragon dictation along with smaller phrase technology. Any transcriptional errors that result from this process are unin

## 2017-04-26 NOTE — ED Triage Notes (Signed)
Per EMS fell down 5 to 6 steps and hit cement. Laceration noted to back of head with active bleeding. MD to room and staples to lac with pressure gauze over. Large hematoma back of head around laceration. States lives alone, remembers fall. Alert and oriented.

## 2017-04-26 NOTE — ED Notes (Signed)
No further bleeding noted through gauze.

## 2017-04-27 DIAGNOSIS — S329XXA Fracture of unspecified parts of lumbosacral spine and pelvis, initial encounter for closed fracture: Secondary | ICD-10-CM | POA: Diagnosis not present

## 2017-04-27 DIAGNOSIS — N2581 Secondary hyperparathyroidism of renal origin: Secondary | ICD-10-CM | POA: Diagnosis not present

## 2017-04-27 DIAGNOSIS — S0191XA Laceration without foreign body of unspecified part of head, initial encounter: Secondary | ICD-10-CM | POA: Diagnosis not present

## 2017-04-27 DIAGNOSIS — I120 Hypertensive chronic kidney disease with stage 5 chronic kidney disease or end stage renal disease: Secondary | ICD-10-CM | POA: Diagnosis not present

## 2017-04-27 DIAGNOSIS — I160 Hypertensive urgency: Secondary | ICD-10-CM | POA: Diagnosis not present

## 2017-04-27 DIAGNOSIS — N186 End stage renal disease: Secondary | ICD-10-CM | POA: Diagnosis not present

## 2017-04-27 DIAGNOSIS — D631 Anemia in chronic kidney disease: Secondary | ICD-10-CM | POA: Diagnosis not present

## 2017-04-27 LAB — GLUCOSE, CAPILLARY
GLUCOSE-CAPILLARY: 143 mg/dL — AB (ref 65–99)
GLUCOSE-CAPILLARY: 223 mg/dL — AB (ref 65–99)
Glucose-Capillary: 86 mg/dL (ref 65–99)

## 2017-04-27 LAB — CBC
HEMATOCRIT: 24.1 % — AB (ref 35.0–47.0)
Hemoglobin: 8 g/dL — ABNORMAL LOW (ref 12.0–16.0)
MCH: 35 pg — ABNORMAL HIGH (ref 26.0–34.0)
MCHC: 33.3 g/dL (ref 32.0–36.0)
MCV: 105.1 fL — AB (ref 80.0–100.0)
Platelets: 148 10*3/uL — ABNORMAL LOW (ref 150–440)
RBC: 2.3 MIL/uL — AB (ref 3.80–5.20)
RDW: 16.2 % — ABNORMAL HIGH (ref 11.5–14.5)
WBC: 6.8 10*3/uL (ref 3.6–11.0)

## 2017-04-27 LAB — BASIC METABOLIC PANEL
ANION GAP: 10 (ref 5–15)
BUN: 50 mg/dL — ABNORMAL HIGH (ref 6–20)
CHLORIDE: 99 mmol/L — AB (ref 101–111)
CO2: 25 mmol/L (ref 22–32)
Calcium: 8.3 mg/dL — ABNORMAL LOW (ref 8.9–10.3)
Creatinine, Ser: 5.69 mg/dL — ABNORMAL HIGH (ref 0.44–1.00)
GFR, EST AFRICAN AMERICAN: 7 mL/min — AB (ref >60)
GFR, EST NON AFRICAN AMERICAN: 6 mL/min — AB (ref >60)
Glucose, Bld: 95 mg/dL (ref 65–99)
POTASSIUM: 4.5 mmol/L (ref 3.5–5.1)
SODIUM: 134 mmol/L — AB (ref 135–145)

## 2017-04-27 NOTE — Progress Notes (Signed)
HD TX started  

## 2017-04-27 NOTE — Care Management Obs Status (Signed)
[place] NOTIFICATION   Patient Details  Name: [name] MRN: [id number] Date of Birth: July 16, 1920   Medicare Observation Status Notification Given:  Yes    Jolly Mango, RN 04/27/2017, 9:45 AM

## 2017-04-27 NOTE — Progress Notes (Signed)
PRE HD TX 

## 2017-04-27 NOTE — Progress Notes (Signed)
HD TX ended  

## 2017-04-27 NOTE — Progress Notes (Signed)
Post HD  

## 2017-04-27 NOTE — NC FL2 (Signed)
[place] LEVEL OF CARE SCREENING TOOL     IDENTIFICATION  Patient Name: [name] Birthdate: 1920-02-25 Sex: female Admission Date (Current Location): 04/26/2017  [place] and [place] Number:  Engineering geologist and Address:  [facility], [address], [place],  [address]      Provider Number: [id number]  Attending Physician Name and Address:  Nicholes Mango, MD  Relative Name and Phone Number:  Kamaryn Grimley, son [phone]    Current Level of Care: Hospital Recommended Level of Care: [place] Prior Approval Number:    Date Approved/Denied:   PASRR Number: [id number]  Discharge Plan: SNF    Current Diagnoses: Patient Active Problem List   Diagnosis Date Noted  . Pelvic fracture ([place]) 04/26/2017  . Protein-calorie malnutrition, severe 01/07/2017  . CHF (congestive heart failure) ([place]) 01/03/2017  . UTI (urinary tract infection) 01/03/2017  . Acute metabolic encephalopathy 11/57/2620  . Cerebrovascular accident (CVA) due to occlusion of right middle cerebral artery ([place]) 10/24/2015  . Malnutrition of mild degree ([place]) 10/24/2015  . Altered mental status 09/18/2015  . Preventative health care 05/03/2014  . Peripheral vascular disease ([place]) 12/21/2013  . Toe amputation status ([place]) 08/29/2013  . History of recent fall 04/18/2013  . HTN (hypertension), malignant 03/22/2013  . S/P aortic valve replacement with bioprosthetic valve 11/15/2012  . S/P CABG x 3 11/15/2012  . Anemia of chronic kidney failure 12/22/2010  . Thrombocytopenia ([place]) 12/13/2010  . End stage renal disease on dialysis ([place]) 12/13/2010  . S/P aortic valve replacement 08/11/2010  . Arterial insufficiency-lower 07/25/2010  . NEURODERMATITIS 04/08/2009  . Carotid arterial disease ([place]) 01/01/2009  . Coronary atherosclerosis 02/11/2007  . AORTIC STENOSIS 10/04/2006  . ANXIETY 05/30/2006  . Pulmonary hypertension ([place]) 05/30/2006  .  Hypothyroidism 05/27/2006  . Type 2 diabetes mellitus with renal manifestations, controlled ([place]) 05/27/2006  . Hyperlipidemia 05/27/2006  . Essential hypertension 05/27/2006  . GERD 05/27/2006  . OSTEOARTHRITIS 05/27/2006  . OSTEOPENIA 05/27/2006  . URINARY INCONTINENCE 05/27/2006    Orientation RESPIRATION BLADDER Height & Weight     Self, Time, Situation, Place  O2(2 liters ) Incontinent Weight: 102 lb (46.3 kg) Height:  5\' 3"  (160 cm)  BEHAVIORAL SYMPTOMS/MOOD NEUROLOGICAL BOWEL NUTRITION STATUS  (none) (none) Continent Diet(Heart Healthy )  AMBULATORY STATUS COMMUNICATION OF NEEDS Skin   Extensive Assist Verbally (S) Other (Comment)(Head Laceration)                       Personal Care Assistance Level of Assistance  Bathing, Feeding, Dressing Bathing Assistance: Limited assistance Feeding assistance: Independent Dressing Assistance: Limited assistance     Functional Limitations Info  (none)          SPECIAL CARE FACTORS FREQUENCY  PT (By licensed PT), OT (By licensed OT)     PT Frequency: 5 OT Frequency: 5            Contractures Contractures Info: Not present    Additional Factors Info  Code Status, Allergies Code Status Info: DNR Allergies Info: Captopril, Ramipril, Sulfa Antibiotics, Verapamil, Nitrofurantoin, Enalapril Maleate           Current Medications (04/27/2017):  This is the current hospital active medication list Current Facility-Administered Medications  Medication Dose Route Frequency Provider Last Rate Last Dose  . 0.9 %  sodium chloride infusion  250 mL Intravenous PRN Demetrios Loll, MD      . acetaminophen (TYLENOL) tablet 650 mg  650  mg Oral Q6H PRN Demetrios Loll, MD       Or  . acetaminophen (TYLENOL) suppository 650 mg  650 mg Rectal Q6H PRN Demetrios Loll, MD      . albuterol (PROVENTIL) (2.5 MG/3ML) 0.083% nebulizer solution 2.5 mg  2.5 mg Nebulization Q2H PRN Demetrios Loll, MD      . amLODipine (NORVASC) tablet 5 mg  5 mg Oral Daily  Demetrios Loll, MD   Stopped at 04/27/17 [phone]  . aspirin EC tablet 81 mg  81 mg Oral QHS Demetrios Loll, MD   81 mg at 04/26/17 2159  . bisacodyl (DULCOLAX) EC tablet 5 mg  5 mg Oral Daily PRN Demetrios Loll, MD      . calcium carbonate (TUMS - dosed in mg elemental calcium) chewable tablet 200 mg of elemental calcium  1 tablet Oral TID WC Demetrios Loll, MD   200 mg of elemental calcium at 04/27/17 0958  . cloNIDine (CATAPRES) tablet 0.1 mg  0.1 mg Oral BID Demetrios Loll, MD   Stopped at 04/27/17 [phone]  . clopidogrel (PLAVIX) tablet 75 mg  75 mg Oral Daily Demetrios Loll, MD   75 mg at 04/27/17 0959  . feeding supplement (ENSURE ENLIVE) (ENSURE ENLIVE) liquid 237 mL  237 mL Oral TID BM Demetrios Loll, MD      . folic acid (FOLVITE) tablet 0.5 mg  0.5 mg Oral Daily Demetrios Loll, MD   0.5 mg at 04/27/17 0959  . heparin injection 5,000 Units  5,000 Units Subcutaneous Q8H Demetrios Loll, MD   5,000 Units at 04/27/17 0532  . hydrALAZINE (APRESOLINE) injection 10 mg  10 mg Intravenous Q6H PRN Demetrios Loll, MD   10 mg at 04/26/17 1743  . hydrALAZINE (APRESOLINE) tablet 25 mg  25 mg Oral Q8H Demetrios Loll, MD   25 mg at 04/27/17 0532  . HYDROcodone-acetaminophen (NORCO/VICODIN) 5-325 MG per tablet 1-2 tablet  1-2 tablet Oral Q4H PRN Demetrios Loll, MD      . insulin aspart (novoLOG) injection 0-5 Units  0-5 Units Subcutaneous QHS Demetrios Loll, MD      . insulin aspart (novoLOG) injection 0-9 Units  0-9 Units Subcutaneous TID WC Demetrios Loll, MD      . levothyroxine (SYNTHROID, LEVOTHROID) tablet 75 mcg  75 mcg Oral Daily Demetrios Loll, MD   75 mcg at 04/27/17 [phone]  . metoprolol tartrate (LOPRESSOR) tablet 50 mg  50 mg Oral BID Demetrios Loll, MD   50 mg at 04/26/17 2158  . ondansetron (ZOFRAN) tablet 4 mg  4 mg Oral Q6H PRN Demetrios Loll, MD       Or  . ondansetron [facility]) injection 4 mg  4 mg Intravenous Q6H PRN Demetrios Loll, MD      . polyvinyl alcohol (LIQUIFILM TEARS) 1.4 % ophthalmic solution 1 drop  1 drop Both Eyes PRN Demetrios Loll, MD   1 drop at 04/27/17  [phone]  . pravastatin (PRAVACHOL) tablet 40 mg  40 mg Oral QPM Demetrios Loll, MD   40 mg at 04/26/17 1744  . senna-docusate (Senokot-S) tablet 1 tablet  1 tablet Oral QHS PRN Demetrios Loll, MD      . sodium chloride flush (NS) 0.9 % injection 3 mL  3 mL Intravenous Q12H Demetrios Loll, MD   3 mL at 04/27/17 1000  . sodium chloride flush (NS) 0.9 % injection 3 mL  3 mL Intravenous PRN Demetrios Loll, MD         Discharge Medications: Please see discharge summary for  a list of discharge medications.  Relevant Imaging Results:  Relevant Lab Results:   Additional Information SSN: [id number]  Annamaria Boots, [place]

## 2017-04-27 NOTE — Clinical Social Work Placement (Signed)
   CLINICAL SOCIAL WORK PLACEMENT  NOTE  Date:  04/27/2017  Patient Details  Name: [name] MRN: [id number] Date of Birth: 05-19-1920  Clinical Social Work is seeking post-discharge placement for this patient at the [place] level of care (*CSW will initial, date and re-position this form in  chart as items are completed):  Yes   Patient/family provided with [place] Work Department's list of facilities offering this level of care within the geographic area requested by the patient (or if unable, by the patient's family).  Yes   Patient/family informed of their freedom to choose among providers that offer the needed level of care, that participate in Medicare, Medicaid or managed care program needed by the patient, have an available bed and are willing to accept the patient.  Yes   Patient/family informed of [place]'s ownership interest in [facility] and [facility], as well as of the fact that they are under no obligation to receive care at these facilities.  PASRR submitted to EDS on       PASRR number received on       Existing PASRR number confirmed on 04/27/17     FL2 transmitted to all facilities in geographic area requested by pt/family on 04/27/17     FL2 transmitted to all facilities within larger geographic area on       Patient informed that his/her managed care company has contracts with or will negotiate with certain facilities, including the following:            Patient/family informed of bed offers received.  Patient chooses bed at       Physician recommends and patient chooses bed at      Patient to be transferred to   on  .  Patient to be transferred to facility by       Patient family notified on   of transfer.  Name of family member notified:        PHYSICIAN       Additional Comment:    _______________________________________________ Ersa Delaney, Veronia Beets, LCSW 04/27/2017, 11:54 AM

## 2017-04-27 NOTE — Care Management (Signed)
Attempted to speak to patient regarding observation notice but she could not hear me. Attempted to call son with no answer at home or cell number. Will re attempt to reach son later.

## 2017-04-27 NOTE — Progress Notes (Signed)
[facility]  ROUNDING NOTE   Subjective:   [name] admitted to [facility] on 04/26/2017 for Injury of head, initial encounter [S09.90XA] Scalp laceration, initial encounter [S01.01XA] Closed nondisplaced fracture of pelvis, unspecified part of pelvis, initial encounter ([place]) [S32.9XXA]  Seen and examined on hemodiaylysis.    HEMODIALYSIS FLOWSHEET:  Blood Flow Rate (mL/min): 400 mL/min Arterial Pressure (mmHg): -120 mmHg Venous Pressure (mmHg): 140 mmHg Transmembrane Pressure (mmHg): 60 mmHg Ultrafiltration Rate (mL/min): 500 mL/min Dialysate Flow Rate (mL/min): 600 ml/min Conductivity: Machine : 14 Conductivity: Machine : 14 Dialysis Fluid Bolus: Normal Saline Bolus Amount (mL): 250 mL    Objective:  Vital signs in last 24 hours:  Temp:  [97.9 F (36.6 C)-98.4 F (36.9 C)] 98.2 F (36.8 C) (04/23 1134) Pulse Rate:  [70-95] 91 (04/23 1345) Resp:  [16-30] 21 (04/23 1345) BP: (132-206)/(41-183) 138/112 (04/23 1345) SpO2:  [91 %-100 %] 95 % (04/23 1345) Weight:  [42.2 kg (93 lb 0.6 oz)] 42.2 kg (93 lb 0.6 oz) (04/23 1134)  Weight change:  Filed Weights   04/26/17 0915 04/27/17 1134  Weight: 46.3 kg (102 lb) 42.2 kg (93 lb 0.6 oz)    Intake/Output: No intake/output data recorded.   Intake/Output this shift:  Total I/O In: 360 [P.O.:360] Out: -   Physical Exam: General: NAD,   Head: Normocephalic, atraumatic. Moist oral mucosal membranes  Eyes: Anicteric, PERRL  Neck: Supple, trachea midline  Lungs:  Clear to auscultation  Heart: Regular rate and rhythm  Abdomen:  Soft, nontender,   Extremities:  no peripheral edema.  Neurologic: Confused, alert to self only  Skin: No lesions  Access: AVF    Basic Metabolic Panel: Recent Labs  Lab 04/26/17 0936 04/27/17 0558  NA 137 134*  K 3.9 4.5  CL 98* 99*  CO2 28 25  GLUCOSE 183* 95  BUN 45* 50*  CREATININE 4.73* 5.69*  CALCIUM 8.9 8.3*    Liver Function Tests: Recent Labs  Lab  04/26/17 0936  AST 25  ALT 12*  ALKPHOS 159*  BILITOT 1.1  PROT 7.4  ALBUMIN 3.5   No results for input(s): LIPASE, AMYLASE in the last 168 hours. No results for input(s): AMMONIA in the last 168 hours.  CBC: Recent Labs  Lab 04/26/17 0936 04/27/17 0558  WBC 9.6 6.8  HGB 10.4* 8.0*  HCT 31.1* 24.1*  MCV 104.0* 105.1*  PLT 139* 148*    Cardiac Enzymes: No results for input(s): CKTOTAL, CKMB, CKMBINDEX, TROPONINI in the last 168 hours.  BNP: Invalid input(s): POCBNP  CBG: Recent Labs  Lab 04/26/17 2105 04/27/17 0817  GLUCAP 195* 29    Microbiology: Results for orders placed or performed during the hospital encounter of 01/03/17  Blood Culture (routine x 2)     Status: None   Collection Time: 01/03/17  8:36 AM  Result Value Ref Range Status   Specimen Description BLOOD BLOOD RIGHT HAND  Final   Special Requests   Final    BOTTLES DRAWN AEROBIC AND ANAEROBIC Blood Culture results may not be optimal due to an inadequate volume of blood received in culture bottles   Culture   Final    NO GROWTH 5 DAYS Performed at [facility], [address] [place], [place] [address]    Report Status 01/08/2017 FINAL  Final  Blood Culture (routine x 2)     Status: None   Collection Time: 01/03/17  8:36 AM  Result Value Ref Range Status   Specimen Description  BLOOD BLOOD RIGHT ARM  Final   Special Requests   Final    BOTTLES DRAWN AEROBIC AND ANAEROBIC Blood Culture adequate volume   Culture   Final    NO GROWTH 5 DAYS Performed at [facility], [address] [place], [place] [address]    Report Status 01/08/2017 FINAL  Final  Urine culture     Status: Abnormal   Collection Time: 01/03/17  8:36 AM  Result Value Ref Range Status   Specimen Description   Final    URINE, RANDOM Performed at [facility], [address] [place], [place] [address]    Special Requests   Final    NONE Performed at [facility], [address] [place], [place] [address]    Culture >=100,000 COLONIES/mL ESCHERICHIA COLI (A)  Final   Report Status 01/05/2017 FINAL  Final   Organism ID, Bacteria ESCHERICHIA COLI (A)  Final      Susceptibility   Escherichia coli - MIC*    AMPICILLIN >=32 RESISTANT Resistant     CEFAZOLIN 16 SENSITIVE Sensitive     CEFTRIAXONE <=1 SENSITIVE Sensitive     CIPROFLOXACIN >=4 RESISTANT Resistant     GENTAMICIN >=16 RESISTANT Resistant     IMIPENEM <=0.25 SENSITIVE Sensitive     NITROFURANTOIN 64 INTERMEDIATE Intermediate     TRIMETH/SULFA <=20 SENSITIVE Sensitive     AMPICILLIN/SULBACTAM >=32 RESISTANT Resistant     PIP/TAZO <=4 SENSITIVE Sensitive     Extended ESBL NEGATIVE Sensitive     * >=100,000 COLONIES/mL ESCHERICHIA COLI  MRSA PCR Screening     Status: None   Collection Time: 01/03/17  2:30 PM  Result Value Ref Range Status   MRSA by PCR NEGATIVE NEGATIVE Final    Comment:        The GeneXpert MRSA Assay (FDA approved for NASAL specimens only), is one component of a comprehensive MRSA colonization surveillance program. It is not intended to diagnose MRSA infection nor to guide or monitor treatment for MRSA infections. Performed at [facility], [address] [place], [place] [address]     Coagulation Studies: No results for input(s): LABPROT, INR in the last 72 hours.  Urinalysis: No results for input(s): COLORURINE, LABSPEC, PHURINE, GLUCOSEU, HGBUR, BILIRUBINUR, KETONESUR, PROTEINUR, UROBILINOGEN, NITRITE, LEUKOCYTESUR in the last 72 hours.  Invalid input(s): APPERANCEUR    Imaging: Dg Pelvis 1-2 Views  Result Date: 04/26/2017 CLINICAL DATA:  Fall today. EXAM: PELVIS - 1-2 VIEW COMPARISON:  Abdominal radiograph 11/26/2012 FINDINGS: No visible fracture or pelvic diastasis. Limited over the central pelvis due to gas and stool. Osteopenia. Extensive atherosclerotic calcification. Partially visualized lumbar scoliosis. IMPRESSION: 1. No acute finding. 2. If focal  hip pain dedicated images are recommended. Electronically Signed   By: Monte Fantasia M.D.   On: 04/26/2017 09:52   Ct Head Wo Contrast  Result Date: 04/26/2017 CLINICAL DATA:  Head laceration after fall. EXAM: CT HEAD WITHOUT CONTRAST CT CERVICAL SPINE WITHOUT CONTRAST TECHNIQUE: Multidetector CT imaging of the head and cervical spine was performed following the standard protocol without intravenous contrast. Multiplanar CT image reconstructions of the cervical spine were also generated. COMPARISON:  CT scan of January 03, 2017. FINDINGS: CT HEAD FINDINGS Brain: Mild diffuse cortical atrophy is noted. Mild chronic ischemic white matter disease is noted. Old right parietal infarction is noted which is unchanged. No mass effect or midline shift is noted. Ventricular size is within normal limits. There is no evidence of mass lesion, hemorrhage or  acute infarction. Vascular: No hyperdense vessel or unexpected calcification. Skull: Normal. Negative for fracture or focal lesion. Sinuses/Orbits: No acute finding. Other: Large posterior scalp hematoma is noted. CT CERVICAL SPINE FINDINGS Alignment: Minimal grade 1 anterolisthesis of C6-7 is noted secondary to posterior facet joint hypertrophy. Skull base and vertebrae: No acute fracture. No primary bone lesion or focal pathologic process. Soft tissues and spinal canal: Mild bilateral apical pleural calcifications and pleural thickening is noted. Disc levels: Severe degenerative disc disease is noted at C4-5, C5-6 and C6-7. Upper chest: Biapical pleural calcifications and pleural thickening is noted. Other: Degenerative changes are seen involving the posterior facet joints bilaterally. IMPRESSION: Mild diffuse cortical atrophy. Mild chronic ischemic white matter disease. Large posterior scalp hematoma. No acute intracranial abnormality seen. Severe multilevel degenerative disc disease. No acute abnormality seen in the cervical spine. Electronically Signed   By: Marijo Conception, M.D.   On: 04/26/2017 09:44   Ct Cervical Spine Wo Contrast  Result Date: 04/26/2017 CLINICAL DATA:  Head laceration after fall. EXAM: CT HEAD WITHOUT CONTRAST CT CERVICAL SPINE WITHOUT CONTRAST TECHNIQUE: Multidetector CT imaging of the head and cervical spine was performed following the standard protocol without intravenous contrast. Multiplanar CT image reconstructions of the cervical spine were also generated. COMPARISON:  CT scan of January 03, 2017. FINDINGS: CT HEAD FINDINGS Brain: Mild diffuse cortical atrophy is noted. Mild chronic ischemic white matter disease is noted. Old right parietal infarction is noted which is unchanged. No mass effect or midline shift is noted. Ventricular size is within normal limits. There is no evidence of mass lesion, hemorrhage or acute infarction. Vascular: No hyperdense vessel or unexpected calcification. Skull: Normal. Negative for fracture or focal lesion. Sinuses/Orbits: No acute finding. Other: Large posterior scalp hematoma is noted. CT CERVICAL SPINE FINDINGS Alignment: Minimal grade 1 anterolisthesis of C6-7 is noted secondary to posterior facet joint hypertrophy. Skull base and vertebrae: No acute fracture. No primary bone lesion or focal pathologic process. Soft tissues and spinal canal: Mild bilateral apical pleural calcifications and pleural thickening is noted. Disc levels: Severe degenerative disc disease is noted at C4-5, C5-6 and C6-7. Upper chest: Biapical pleural calcifications and pleural thickening is noted. Other: Degenerative changes are seen involving the posterior facet joints bilaterally. IMPRESSION: Mild diffuse cortical atrophy. Mild chronic ischemic white matter disease. Large posterior scalp hematoma. No acute intracranial abnormality seen. Severe multilevel degenerative disc disease. No acute abnormality seen in the cervical spine. Electronically Signed   By: Marijo Conception, M.D.   On: 04/26/2017 09:44   Ct Hip Right Wo  Contrast  Result Date: 04/26/2017 CLINICAL DATA:  Right hip pain secondary to a fall on 04/26/2017. EXAM: CT OF THE RIGHT HIP WITHOUT CONTRAST TECHNIQUE: Multidetector CT imaging of the right hip was performed according to the standard protocol. Multiplanar CT image reconstructions were also generated. COMPARISON:  Radiographs dated 04/26/2017 FINDINGS: Bones/Joint/Cartilage There is a subtle nondisplaced fracture of the right inferior pubic ramus. Superior pubic ramus and acetabulum are intact. Proximal left femur is intact. Arthritic changes of the superior aspect of the right acetabulum with prominent subcortical cysts and cortical defects in the roof of the acetabulum. Muscles and Tendons Normal. Soft tissues Moderate ascites in the pelvis of unknown etiology. IMPRESSION: 1. Nondisplaced fracture of right inferior pubic ramus. 2. Intact proximal right femur. 3. Arthritic changes of the right acetabulum. 4. Nonspecific ascites. Electronically Signed   By: Lorriane Shire M.D.   On: 04/26/2017 12:57   Dg Hip  Unilat With Pelvis 2-3 Views Right  Result Date: 04/26/2017 CLINICAL DATA:  Patient reports falling down 5 DIS 6 steps striking a cement surface. EXAM: DG HIP (WITH OR WITHOUT PELVIS) 2-3V RIGHT COMPARISON:  AP view of the pelvis of today's date FINDINGS: The bones are subjectively osteopenic. The right hip joint space is well maintained. The articular surfaces of the right femoral head and acetabulum remains smoothly rounded. The femoral neck, intertrochanteric, and subtrochanteric regions are normal. IMPRESSION: There is no acute fracture of the right hip. Electronically Signed   By: David  Martinique M.D.   On: 04/26/2017 12:01     Medications:   . sodium chloride     . amLODipine  5 mg Oral Daily  . aspirin EC  81 mg Oral QHS  . calcium carbonate  1 tablet Oral TID WC  . cloNIDine  0.1 mg Oral BID  . clopidogrel  75 mg Oral Daily  . feeding supplement (ENSURE ENLIVE)  237 mL Oral TID BM  .  folic acid  0.5 mg Oral Daily  . heparin  5,000 Units Subcutaneous Q8H  . hydrALAZINE  25 mg Oral Q8H  . insulin aspart  0-5 Units Subcutaneous QHS  . insulin aspart  0-9 Units Subcutaneous TID WC  . levothyroxine  75 mcg Oral Daily  . metoprolol tartrate  50 mg Oral BID  . pravastatin  40 mg Oral QPM  . sodium chloride flush  3 mL Intravenous Q12H   sodium chloride, acetaminophen **OR** acetaminophen, albuterol, bisacodyl, hydrALAZINE, HYDROcodone-acetaminophen, ondansetron **OR** ondansetron (ZOFRAN) IV, polyvinyl alcohol, senna-docusate, sodium chloride flush  Assessment/ Plan:  [name] is a 84 y.o. white female with end stage renal disease on hemodialysis, coronary artery disease, history of bilateral knee replacement, aortic valve replacement 2009, diabetes, hypertension, hypothyroidism, peripheral vascular disease, pulmonary hypertension, anxiety  [facility] nephrology/MWF  1.  ESRD 2.  Altered mental status  3. Hypertension 4.  Anemia of chronic kidney disease  5.  Secondary hyperparathyroidism  Seen and examined on hemodialysis. Tolerating treatment well. Resume MWF schedule, next treatment for tomorrow.      LOS: 0 Rickardo Brinegar 4/23/20192:16 PM

## 2017-04-27 NOTE — Progress Notes (Signed)
Clinical Social worker (CSW) contacted patient's son Ginamarie Banfield [phone]). Left voicemail for him stating [facility] does have a bed and offered for patient pending insurance authorization. CSW also notified patient that [facility] can offer. Patient wants to accept bed offer. Will plan to DC to [facility] pending insurance authorization.    Annamaria Boots, MSW, [place]

## 2017-04-27 NOTE — Progress Notes (Signed)
[facility] [place] NAME: [name]    MR#:  [id number]  DATE OF BIRTH:  Apr 30, 1920  SUBJECTIVE:  CHIEF COMPLAINT: Patient is reporting back pain very hard of hearing  REVIEW OF SYSTEMS:  CONSTITUTIONAL: No fever, reporting weakness.  EYES: No blurred or double vision.  EARS, NOSE, AND THROAT: No tinnitus or ear pain.  RESPIRATORY: No cough, shortness of breath, wheezing or hemoptysis.  CARDIOVASCULAR: No chest pain, orthopnea, edema.  GASTROINTESTINAL: No nausea, vomiting, diarrhea or abdominal pain.  GENITOURINARY: No dysuria, hematuria.  ENDOCRINE: No polyuria, nocturia,  HEMATOLOGY: No anemia, easy bruising or bleeding SKIN: No rash or lesion. MUSCULOSKELETAL: Reporting back pain NEUROLOGIC: No tingling, numbness, weakness.  PSYCHIATRY: No anxiety or depression.   DRUG ALLERGIES:   Allergies  Allergen Reactions  . Nitrofurantoin Itching  . Captopril Other (See Comments)    REACTION: unspecified  . Enalapril Maleate Cough  . Ramipril Other (See Comments)    REACTION: unspecified  . Sulfa Antibiotics Other (See Comments)    Reaction unknown  . Verapamil Other (See Comments)    REACTION: unspecified    VITALS:  Blood pressure (!) 148/50, pulse 97, temperature 98.9 F (37.2 C), temperature source Oral, resp. rate 20, height 5\' 3"  (1.6 m), weight 42.2 kg (93 lb 0.6 oz), SpO2 91 %.  PHYSICAL EXAMINATION:  GENERAL:  84 y.o.-year-old patient lying in the bed with no acute distress.  EYES: Pupils equal, round, reactive to light and accommodation. No scleral icterus. Extraocular muscles intact.  HEENT: Occipital scalp laceration with staples , Oropharynx and nasopharynx clear.  NECK:  Supple, no jugular venous distention. No thyroid enlargement, no tenderness.  LUNGS: Normal breath sounds bilaterally, no wheezing, rales,rhonchi or crepitation. No use of accessory muscles of respiration.  CARDIOVASCULAR: S1, S2 normal. No  murmurs, rubs, or gallops.  ABDOMEN: Soft, nontender, nondistended. Bowel sounds present EXTREMITIES: Diffuse pelvic tenderness and hip tenderness.  NEUROLOGIC: Cranial nerves II through XII are intact. Sensation intact. Gait not checked.  PSYCHIATRIC: The patient is alert and oriented x 3.  SKIN: No obvious rash, lesion, or ulcer.    LABORATORY PANEL:   CBC Recent Labs  Lab 04/27/17 0558  WBC 6.8  HGB 8.0*  HCT 24.1*  PLT 148*   ------------------------------------------------------------------------------------------------------------------  Chemistries  Recent Labs  Lab 04/26/17 0936 04/27/17 0558  NA 137 134*  K 3.9 4.5  CL 98* 99*  CO2 28 25  GLUCOSE 183* 95  BUN 45* 50*  CREATININE 4.73* 5.69*  CALCIUM 8.9 8.3*  AST 25  --   ALT 12*  --   ALKPHOS 159*  --   BILITOT 1.1  --    ------------------------------------------------------------------------------------------------------------------  Cardiac Enzymes No results for input(s): TROPONINI in the last 168 hours. ------------------------------------------------------------------------------------------------------------------  RADIOLOGY:  Dg Pelvis 1-2 Views  Result Date: 04/26/2017 CLINICAL DATA:  Fall today. EXAM: PELVIS - 1-2 VIEW COMPARISON:  Abdominal radiograph 11/26/2012 FINDINGS: No visible fracture or pelvic diastasis. Limited over the central pelvis due to gas and stool. Osteopenia. Extensive atherosclerotic calcification. Partially visualized lumbar scoliosis. IMPRESSION: 1. No acute finding. 2. If focal hip pain dedicated images are recommended. Electronically Signed   By: Monte Fantasia M.D.   On: 04/26/2017 09:52   Ct Head Wo Contrast  Result Date: 04/26/2017 CLINICAL DATA:  Head laceration after fall. EXAM: CT HEAD WITHOUT CONTRAST CT CERVICAL SPINE WITHOUT CONTRAST TECHNIQUE: Multidetector CT imaging of the head and cervical spine was performed following the  standard protocol without  intravenous contrast. Multiplanar CT image reconstructions of the cervical spine were also generated. COMPARISON:  CT scan of January 03, 2017. FINDINGS: CT HEAD FINDINGS Brain: Mild diffuse cortical atrophy is noted. Mild chronic ischemic white matter disease is noted. Old right parietal infarction is noted which is unchanged. No mass effect or midline shift is noted. Ventricular size is within normal limits. There is no evidence of mass lesion, hemorrhage or acute infarction. Vascular: No hyperdense vessel or unexpected calcification. Skull: Normal. Negative for fracture or focal lesion. Sinuses/Orbits: No acute finding. Other: Large posterior scalp hematoma is noted. CT CERVICAL SPINE FINDINGS Alignment: Minimal grade 1 anterolisthesis of C6-7 is noted secondary to posterior facet joint hypertrophy. Skull base and vertebrae: No acute fracture. No primary bone lesion or focal pathologic process. Soft tissues and spinal canal: Mild bilateral apical pleural calcifications and pleural thickening is noted. Disc levels: Severe degenerative disc disease is noted at C4-5, C5-6 and C6-7. Upper chest: Biapical pleural calcifications and pleural thickening is noted. Other: Degenerative changes are seen involving the posterior facet joints bilaterally. IMPRESSION: Mild diffuse cortical atrophy. Mild chronic ischemic white matter disease. Large posterior scalp hematoma. No acute intracranial abnormality seen. Severe multilevel degenerative disc disease. No acute abnormality seen in the cervical spine. Electronically Signed   By: Marijo Conception, M.D.   On: 04/26/2017 09:44   Ct Cervical Spine Wo Contrast  Result Date: 04/26/2017 CLINICAL DATA:  Head laceration after fall. EXAM: CT HEAD WITHOUT CONTRAST CT CERVICAL SPINE WITHOUT CONTRAST TECHNIQUE: Multidetector CT imaging of the head and cervical spine was performed following the standard protocol without intravenous contrast. Multiplanar CT image reconstructions of the  cervical spine were also generated. COMPARISON:  CT scan of January 03, 2017. FINDINGS: CT HEAD FINDINGS Brain: Mild diffuse cortical atrophy is noted. Mild chronic ischemic white matter disease is noted. Old right parietal infarction is noted which is unchanged. No mass effect or midline shift is noted. Ventricular size is within normal limits. There is no evidence of mass lesion, hemorrhage or acute infarction. Vascular: No hyperdense vessel or unexpected calcification. Skull: Normal. Negative for fracture or focal lesion. Sinuses/Orbits: No acute finding. Other: Large posterior scalp hematoma is noted. CT CERVICAL SPINE FINDINGS Alignment: Minimal grade 1 anterolisthesis of C6-7 is noted secondary to posterior facet joint hypertrophy. Skull base and vertebrae: No acute fracture. No primary bone lesion or focal pathologic process. Soft tissues and spinal canal: Mild bilateral apical pleural calcifications and pleural thickening is noted. Disc levels: Severe degenerative disc disease is noted at C4-5, C5-6 and C6-7. Upper chest: Biapical pleural calcifications and pleural thickening is noted. Other: Degenerative changes are seen involving the posterior facet joints bilaterally. IMPRESSION: Mild diffuse cortical atrophy. Mild chronic ischemic white matter disease. Large posterior scalp hematoma. No acute intracranial abnormality seen. Severe multilevel degenerative disc disease. No acute abnormality seen in the cervical spine. Electronically Signed   By: Marijo Conception, M.D.   On: 04/26/2017 09:44   Ct Hip Right Wo Contrast  Result Date: 04/26/2017 CLINICAL DATA:  Right hip pain secondary to a fall on 04/26/2017. EXAM: CT OF THE RIGHT HIP WITHOUT CONTRAST TECHNIQUE: Multidetector CT imaging of the right hip was performed according to the standard protocol. Multiplanar CT image reconstructions were also generated. COMPARISON:  Radiographs dated 04/26/2017 FINDINGS: Bones/Joint/Cartilage There is a subtle  nondisplaced fracture of the right inferior pubic ramus. Superior pubic ramus and acetabulum are intact. Proximal left femur is intact. Arthritic changes of  the superior aspect of the right acetabulum with prominent subcortical cysts and cortical defects in the roof of the acetabulum. Muscles and Tendons Normal. Soft tissues Moderate ascites in the pelvis of unknown etiology. IMPRESSION: 1. Nondisplaced fracture of right inferior pubic ramus. 2. Intact proximal right femur. 3. Arthritic changes of the right acetabulum. 4. Nonspecific ascites. Electronically Signed   By: Lorriane Shire M.D.   On: 04/26/2017 12:57   Dg Hip Unilat With Pelvis 2-3 Views Right  Result Date: 04/26/2017 CLINICAL DATA:  Patient reports falling down 5 DIS 6 steps striking a cement surface. EXAM: DG HIP (WITH OR WITHOUT PELVIS) 2-3V RIGHT COMPARISON:  AP view of the pelvis of today's date FINDINGS: The bones are subjectively osteopenic. The right hip joint space is well maintained. The articular surfaces of the right femoral head and acetabulum remains smoothly rounded. The femoral neck, intertrochanteric, and subtrochanteric regions are normal. IMPRESSION: There is no acute fracture of the right hip. Electronically Signed   By: David  Martinique M.D.   On: 04/26/2017 12:01    EKG:   Orders placed or performed in visit on 03/16/17  . EKG 12-Lead    ASSESSMENT AND PLAN:    Pelvic fracture Pain management as needed PT evaluation is pending  fall precaution.  Hypertension urgency.  home hypertension amlodipine, clonidine and hydralazine will be continued, titrate as needed , IV hydralazine as needed.  Head laceration and Large posterior scalp hematoma, status post staples  ESRD.  Continue scheduled hemodialysis, nephrology  following  CAD.  Continue aspirin, Plavix and statin.      All the records are reviewed and case discussed with Care Management/Social Workerr. Management plans discussed with the patient,  family and they are in agreement.  CODE STATUS: dnr  TOTAL TIME TAKING CARE OF THIS PATIENT: 34 minutes.   POSSIBLE D/C IN 1-2  DAYS, DEPENDING ON CLINICAL CONDITION.  Note: This dictation was prepared with Dragon dictation along with smaller phrase technology. Any transcriptional errors that result from this process are unintentional.   Nicholes Mango M.D on 04/27/2017 at 4:58 PM  Between 7am to 6pm - Pager - [phone] After 6pm go to [email or web address] - password [email or web address] [facility]  Office  [phone]  CC: Primary care physician; [name], MD

## 2017-04-27 NOTE — Progress Notes (Signed)
Pt slept in long intervals this shift. Awakens easily. Pt moved to low bed in room; she tolerated the activity without event. Pt is pleasant this am.

## 2017-04-27 NOTE — Progress Notes (Signed)
PRE HD assessment 

## 2017-04-27 NOTE — Clinical Social Work Note (Addendum)
Clinical Social Work Assessment  Patient Details  Name: [name] MRN: [id number] Date of Birth: 1920-07-12  Date of referral:  04/27/17               Reason for consult:  Discharge Planning, Facility Placement                Permission sought to share information with:  Chartered certified accountant granted to share information::  Yes, Verbal Permission Granted  Name::      [name]::   [place]   Relationship::     Contact Information:     Housing/Transportation Living arrangements for the past 2 months:  [place] of Information:  Patient, Adult Children Patient Interpreter Needed:  None Criminal Activity/Legal Involvement Pertinent to Current Situation/Hospitalization:  No - Comment as needed Significant Relationships:  Adult Children([name] ) Lives with:  Self Do you feel safe going back to the place where you live?  Yes Need for family participation in patient care:  Yes (Comment)  Care giving concerns:  Patient lives alone in [place] and is still driving.    Social Worker assessment / plan: Holiday representative (CSW) met with patient to discuss discharge planning. Patient stated that she lives alone and drives herself to appointments and dialysis. Patient gave permission to discuss discharge plan with her [name], [phone]. CSW discussed with patient that PT evaluation is pending and we would need to have authorization from her insurance [facility] for SNF placement. Patient is in agreement with this and understands she may need SNF at discharge. Patient stated that her [name] would like for her to go to [facility] at discharge. CSW explained that if PT recommended SNF and her insurance approved that we could look into [facility] for rehab. Patient is agreeable to SNF search in [facility]. Patient appreciative of CSW coming by to see her.  FL2 complete and faxed out. Per Estill Bamberg dialysis  coordinator patient goes to [facility] on [address] at 11:15 am per dialysis. CSW contacted patient's [name] and discussed discharge plan. CSW explained that the PT evaluation is pending and we would need to have that for insurance authorization for skilled nursing. Patient's [name] in agreement with SNF placement if PT recommends. He would prefer [facility] if possible. CSW explained the bed search process and that CSW would inform him and patient of what facilities have made offers. Patient's [name] in agreement. Patient's [name] inquired if patient would have Dialysis today because she missed it yesterday. CSW referred him to the nurse for clarification on her dialysis schedule while in the hospital.     Employment status:  Retired Nurse, adult PT Recommendations:  Not assessed at this time Information / Referral to community resources:  [place]  Patient/Family's Response to care:  Patient agreeable to SNF bed search in [facility]   Patient/Family's Understanding of and Emotional Response to Diagnosis, Current Treatment, and Prognosis:  Patient pleasant and accepting of possible SNF placement.   Emotional Assessment Appearance:  Appears younger than stated age Attitude/Demeanor/Rapport:  Engaged(Pleasant and Cooperative ) Affect (typically observed):  Accepting, Happy, Pleasant Orientation:  Oriented to Self, Oriented to Place, Oriented to  Time, Oriented to Situation Alcohol / Substance use:  Not Applicable Psych involvement (Current and /or in the community):  No (Comment)  Discharge Needs  Concerns to be addressed:  Discharge Planning Concerns Readmission within  the last 30 days:  No Current discharge risk:  Dependent with Mobility Barriers to Discharge:  Continued Medical Work up   [employer], [place] 04/27/2017, 11:36 AM

## 2017-04-28 DIAGNOSIS — S329XXA Fracture of unspecified parts of lumbosacral spine and pelvis, initial encounter for closed fracture: Secondary | ICD-10-CM | POA: Diagnosis not present

## 2017-04-28 DIAGNOSIS — I160 Hypertensive urgency: Secondary | ICD-10-CM | POA: Diagnosis not present

## 2017-04-28 DIAGNOSIS — Z86010 Personal history of colonic polyps: Secondary | ICD-10-CM | POA: Diagnosis not present

## 2017-04-28 DIAGNOSIS — K219 Gastro-esophageal reflux disease without esophagitis: Secondary | ICD-10-CM | POA: Diagnosis not present

## 2017-04-28 DIAGNOSIS — I132 Hypertensive heart and chronic kidney disease with heart failure and with stage 5 chronic kidney disease, or end stage renal disease: Secondary | ICD-10-CM | POA: Diagnosis not present

## 2017-04-28 DIAGNOSIS — M8580 Other specified disorders of bone density and structure, unspecified site: Secondary | ICD-10-CM | POA: Diagnosis not present

## 2017-04-28 DIAGNOSIS — R188 Other ascites: Secondary | ICD-10-CM | POA: Diagnosis not present

## 2017-04-28 DIAGNOSIS — S0990XD Unspecified injury of head, subsequent encounter: Secondary | ICD-10-CM | POA: Diagnosis not present

## 2017-04-28 DIAGNOSIS — E039 Hypothyroidism, unspecified: Secondary | ICD-10-CM | POA: Diagnosis not present

## 2017-04-28 DIAGNOSIS — S0101XA Laceration without foreign body of scalp, initial encounter: Secondary | ICD-10-CM | POA: Diagnosis not present

## 2017-04-28 DIAGNOSIS — E1151 Type 2 diabetes mellitus with diabetic peripheral angiopathy without gangrene: Secondary | ICD-10-CM | POA: Diagnosis not present

## 2017-04-28 DIAGNOSIS — S0191XA Laceration without foreign body of unspecified part of head, initial encounter: Secondary | ICD-10-CM | POA: Diagnosis not present

## 2017-04-28 DIAGNOSIS — I5032 Chronic diastolic (congestive) heart failure: Secondary | ICD-10-CM | POA: Diagnosis not present

## 2017-04-28 DIAGNOSIS — N2581 Secondary hyperparathyroidism of renal origin: Secondary | ICD-10-CM | POA: Diagnosis not present

## 2017-04-28 DIAGNOSIS — Z85828 Personal history of other malignant neoplasm of skin: Secondary | ICD-10-CM | POA: Diagnosis not present

## 2017-04-28 DIAGNOSIS — D631 Anemia in chronic kidney disease: Secondary | ICD-10-CM | POA: Diagnosis not present

## 2017-04-28 DIAGNOSIS — I10 Essential (primary) hypertension: Secondary | ICD-10-CM | POA: Diagnosis not present

## 2017-04-28 DIAGNOSIS — K5900 Constipation, unspecified: Secondary | ICD-10-CM | POA: Diagnosis not present

## 2017-04-28 DIAGNOSIS — E119 Type 2 diabetes mellitus without complications: Secondary | ICD-10-CM | POA: Diagnosis not present

## 2017-04-28 DIAGNOSIS — E785 Hyperlipidemia, unspecified: Secondary | ICD-10-CM | POA: Diagnosis not present

## 2017-04-28 DIAGNOSIS — I350 Nonrheumatic aortic (valve) stenosis: Secondary | ICD-10-CM | POA: Diagnosis not present

## 2017-04-28 DIAGNOSIS — M1990 Unspecified osteoarthritis, unspecified site: Secondary | ICD-10-CM | POA: Diagnosis not present

## 2017-04-28 DIAGNOSIS — Z7401 Bed confinement status: Secondary | ICD-10-CM | POA: Diagnosis not present

## 2017-04-28 DIAGNOSIS — Z992 Dependence on renal dialysis: Secondary | ICD-10-CM | POA: Diagnosis not present

## 2017-04-28 DIAGNOSIS — S329XXD Fracture of unspecified parts of lumbosacral spine and pelvis, subsequent encounter for fracture with routine healing: Secondary | ICD-10-CM | POA: Diagnosis not present

## 2017-04-28 DIAGNOSIS — S0003XA Contusion of scalp, initial encounter: Secondary | ICD-10-CM | POA: Diagnosis not present

## 2017-04-28 DIAGNOSIS — M6281 Muscle weakness (generalized): Secondary | ICD-10-CM | POA: Diagnosis not present

## 2017-04-28 DIAGNOSIS — E1122 Type 2 diabetes mellitus with diabetic chronic kidney disease: Secondary | ICD-10-CM | POA: Diagnosis not present

## 2017-04-28 DIAGNOSIS — I2510 Atherosclerotic heart disease of native coronary artery without angina pectoris: Secondary | ICD-10-CM | POA: Diagnosis not present

## 2017-04-28 DIAGNOSIS — G43909 Migraine, unspecified, not intractable, without status migrainosus: Secondary | ICD-10-CM | POA: Diagnosis not present

## 2017-04-28 DIAGNOSIS — N186 End stage renal disease: Secondary | ICD-10-CM | POA: Diagnosis not present

## 2017-04-28 DIAGNOSIS — R531 Weakness: Secondary | ICD-10-CM | POA: Diagnosis not present

## 2017-04-28 DIAGNOSIS — S32591A Other specified fracture of right pubis, initial encounter for closed fracture: Secondary | ICD-10-CM | POA: Diagnosis not present

## 2017-04-28 DIAGNOSIS — I509 Heart failure, unspecified: Secondary | ICD-10-CM | POA: Diagnosis not present

## 2017-04-28 DIAGNOSIS — I120 Hypertensive chronic kidney disease with stage 5 chronic kidney disease or end stage renal disease: Secondary | ICD-10-CM | POA: Diagnosis not present

## 2017-04-28 LAB — CBC
HCT: 23.4 % — ABNORMAL LOW (ref 35.0–47.0)
HEMOGLOBIN: 8 g/dL — AB (ref 12.0–16.0)
MCH: 35.3 pg — AB (ref 26.0–34.0)
MCHC: 33.9 g/dL (ref 32.0–36.0)
MCV: 103.9 fL — AB (ref 80.0–100.0)
Platelets: 163 10*3/uL (ref 150–440)
RBC: 2.26 MIL/uL — ABNORMAL LOW (ref 3.80–5.20)
RDW: 16 % — ABNORMAL HIGH (ref 11.5–14.5)
WBC: 8.5 10*3/uL (ref 3.6–11.0)

## 2017-04-28 LAB — GLUCOSE, CAPILLARY
GLUCOSE-CAPILLARY: 197 mg/dL — AB (ref 65–99)
Glucose-Capillary: 73 mg/dL (ref 65–99)

## 2017-04-28 MED ORDER — SENNOSIDES-DOCUSATE SODIUM 8.6-50 MG PO TABS: 1.0000 | ORAL_TABLET | Freq: Every evening | ORAL | Status: AC | PRN

## 2017-04-28 MED ORDER — INSULIN ASPART 100 UNIT/ML ~~LOC~~ SOLN: 0.0000 [IU] | Freq: Every day | SUBCUTANEOUS | 11 refills | Status: DC

## 2017-04-28 MED ORDER — HYDROCODONE-ACETAMINOPHEN 5-325 MG PO TABS: 1.0000 | ORAL_TABLET | ORAL | 0 refills | Status: DC | PRN

## 2017-04-28 MED ORDER — ONDANSETRON HCL 4 MG PO TABS: 4.0000 mg | ORAL_TABLET | Freq: Four times a day (QID) | ORAL | 0 refills | Status: AC | PRN

## 2017-04-28 MED ORDER — INSULIN ASPART 100 UNIT/ML ~~LOC~~ SOLN: 0.0000 [IU] | Freq: Three times a day (TID) | SUBCUTANEOUS | 11 refills | Status: DC

## 2017-04-28 MED ORDER — EPOETIN ALFA 10000 UNIT/ML IJ SOLN
10000.0000 [IU] | INTRAMUSCULAR | Status: DC
  Administered 2017-04-28: 10000 [IU] via INTRAVENOUS

## 2017-04-28 MED ORDER — METOPROLOL TARTRATE 5 MG/5ML IV SOLN: 5.0000 mg | INTRAVENOUS | Status: DC | PRN

## 2017-04-28 MED ORDER — ACETAMINOPHEN 325 MG PO TABS: 325.0000 mg | ORAL_TABLET | Freq: Four times a day (QID) | ORAL | Status: AC | PRN

## 2017-04-28 MED ORDER — ALBUTEROL SULFATE (2.5 MG/3ML) 0.083% IN NEBU: 2.5000 mg | INHALATION_SOLUTION | RESPIRATORY_TRACT | 12 refills | Status: AC | PRN

## 2017-04-28 NOTE — Clinical Social Work Placement (Addendum)
   CLINICAL SOCIAL WORK PLACEMENT  NOTE  Date:  04/28/2017  Patient Details  Name: [name] MRN: [id number] Date of Birth: 05-19-1920  Clinical Social Work is seeking post-discharge placement for this patient at the [place] level of care (*CSW will initial, date and re-position this form in  chart as items are completed):  Yes   Patient/family provided with [place] Work Department's list of facilities offering this level of care within the geographic area requested by the patient (or if unable, by the patient's family).  Yes   Patient/family informed of their freedom to choose among providers that offer the needed level of care, that participate in Medicare, Medicaid or managed care program needed by the patient, have an available bed and are willing to accept the patient.  Yes   Patient/family informed of [facility]'s ownership interest in [facility] and [facility], as well as of the fact that they are under no obligation to receive care at these facilities.  PASRR submitted to EDS on       PASRR number received on       Existing PASRR number confirmed on 04/27/17     FL2 transmitted to all facilities in geographic area requested by pt/family on 04/27/17     FL2 transmitted to all facilities within larger geographic area on       Patient informed that his/her managed care company has contracts with or will negotiate with certain facilities, including the following:        Yes   Patient/family informed of bed offers received.  Patient chooses bed at [facility])     Physician recommends and patient chooses bed at [facility])    Patient to be transferred to ([facility]) on 04/28/17.  Patient to be transferred to facility by (EMS)     Patient family notified on 04/28/17 of transfer.  Name of family member notified:  Halford Decamp- Son )     PHYSICIAN       Additional Comment: Son Jodye Scali called back and is in  agreement with discharge to [facility] today.    _______________________________________________ Annamaria Boots, [place] 04/28/2017, 2:46 PM

## 2017-04-28 NOTE — Progress Notes (Signed)
[facility]  ROUNDING NOTE   Subjective:   Seen and examined on hemodiaylysis.    HEMODIALYSIS FLOWSHEET:  Blood Flow Rate (mL/min): 400 mL/min Arterial Pressure (mmHg): -150 mmHg Venous Pressure (mmHg): 150 mmHg Transmembrane Pressure (mmHg): 70 mmHg Ultrafiltration Rate (mL/min): 500 mL/min Dialysate Flow Rate (mL/min): 600 ml/min Conductivity: Machine : 14 Conductivity: Machine : 14 Dialysis Fluid Bolus: Normal Saline Bolus Amount (mL): 250 mL    Objective:  Vital signs in last 24 hours:  Temp:  [98 F (36.7 C)-99.5 F (37.5 C)] 99.5 F (37.5 C) (04/24 1344) Pulse Rate:  [74-97] 93 (04/24 1344) Resp:  [16-33] 20 (04/24 1344) BP: (132-173)/(40-78) 170/59 (04/24 1344) SpO2:  [91 %-98 %] 93 % (04/24 1045) Weight:  [41.8 kg (92 lb 2.4 oz)] 41.8 kg (92 lb 2.4 oz) (04/24 1040)  Weight change: -4.067 kg (-8 lb 15.5 oz) Filed Weights   04/26/17 0915 04/27/17 1134 04/28/17 1040  Weight: 46.3 kg (102 lb) 42.2 kg (93 lb 0.6 oz) 41.8 kg (92 lb 2.4 oz)    Intake/Output: I/O last 3 completed shifts: In: 600 [P.O.:600] Out: 748 [Other:748]   Intake/Output this shift:  Total I/O In: 360 [P.O.:360] Out: 500 [Other:500]  Physical Exam: General: NAD,   Head: Normocephalic, atraumatic. Moist oral mucosal membranes  Eyes: Anicteric, PERRL  Neck: Supple, trachea midline  Lungs:  Clear to auscultation  Heart: Regular rate and rhythm  Abdomen:  Soft, nontender,   Extremities:  no peripheral edema.  Neurologic: Confused, alert to self only  Skin: No lesions  Access: AVF    Basic Metabolic Panel: Recent Labs  Lab 04/26/17 0936 04/27/17 0558  NA 137 134*  K 3.9 4.5  CL 98* 99*  CO2 28 25  GLUCOSE 183* 95  BUN 45* 50*  CREATININE 4.73* 5.69*  CALCIUM 8.9 8.3*    Liver Function Tests: Recent Labs  Lab 04/26/17 0936  AST 25  ALT 12*  ALKPHOS 159*  BILITOT 1.1  PROT 7.4  ALBUMIN 3.5   No results for input(s): LIPASE, AMYLASE in the last 168  hours. No results for input(s): AMMONIA in the last 168 hours.  CBC: Recent Labs  Lab 04/26/17 0936 04/27/17 0558 04/28/17 0426  WBC 9.6 6.8 8.5  HGB 10.4* 8.0* 8.0*  HCT 31.1* 24.1* 23.4*  MCV 104.0* 105.1* 103.9*  PLT 139* 148* 163    Cardiac Enzymes: No results for input(s): CKTOTAL, CKMB, CKMBINDEX, TROPONINI in the last 168 hours.  BNP: Invalid input(s): POCBNP  CBG: Recent Labs  Lab 04/26/17 2105 04/27/17 0817 04/27/17 1646 04/27/17 2114 04/28/17 0754  GLUCAP 195* 86 143* 223* 55    Microbiology: Results for orders placed or performed during the hospital encounter of 01/03/17  Blood Culture (routine x 2)     Status: None   Collection Time: 01/03/17  8:36 AM  Result Value Ref Range Status   Specimen Description BLOOD BLOOD RIGHT HAND  Final   Special Requests   Final    BOTTLES DRAWN AEROBIC AND ANAEROBIC Blood Culture results may not be optimal due to an inadequate volume of blood received in culture bottles   Culture   Final    NO GROWTH 5 DAYS Performed at [facility], [address], [place], [place] [address]    Report Status 01/08/2017 FINAL  Final  Blood Culture (routine x 2)     Status: None   Collection Time: 01/03/17  8:36 AM  Result Value Ref Range Status   Specimen  Description BLOOD BLOOD RIGHT ARM  Final   Special Requests   Final    BOTTLES DRAWN AEROBIC AND ANAEROBIC Blood Culture adequate volume   Culture   Final    NO GROWTH 5 DAYS Performed at [facility], [address] [place], [place] [address]    Report Status 01/08/2017 FINAL  Final  Urine culture     Status: Abnormal   Collection Time: 01/03/17  8:36 AM  Result Value Ref Range Status   Specimen Description   Final    URINE, RANDOM Performed at [facility], [address] [place], [place] [address]    Special Requests   Final    NONE Performed at [facility], [address] [place], [place] [address]    Culture >=100,000  COLONIES/mL ESCHERICHIA COLI (A)  Final   Report Status 01/05/2017 FINAL  Final   Organism ID, Bacteria ESCHERICHIA COLI (A)  Final      Susceptibility   Escherichia coli - MIC*    AMPICILLIN >=32 RESISTANT Resistant     CEFAZOLIN 16 SENSITIVE Sensitive     CEFTRIAXONE <=1 SENSITIVE Sensitive     CIPROFLOXACIN >=4 RESISTANT Resistant     GENTAMICIN >=16 RESISTANT Resistant     IMIPENEM <=0.25 SENSITIVE Sensitive     NITROFURANTOIN 64 INTERMEDIATE Intermediate     TRIMETH/SULFA <=20 SENSITIVE Sensitive     AMPICILLIN/SULBACTAM >=32 RESISTANT Resistant     PIP/TAZO <=4 SENSITIVE Sensitive     Extended ESBL NEGATIVE Sensitive     * >=100,000 COLONIES/mL ESCHERICHIA COLI  MRSA PCR Screening     Status: None   Collection Time: 01/03/17  2:30 PM  Result Value Ref Range Status   MRSA by PCR NEGATIVE NEGATIVE Final    Comment:        The GeneXpert MRSA Assay (FDA approved for NASAL specimens only), is one component of a comprehensive MRSA colonization surveillance program. It is not intended to diagnose MRSA infection nor to guide or monitor treatment for MRSA infections. Performed at [facility], [address] [place],  [address]     Coagulation Studies: No results for input(s): LABPROT, INR in the last 72 hours.  Urinalysis: No results for input(s): COLORURINE, LABSPEC, PHURINE, GLUCOSEU, HGBUR, BILIRUBINUR, KETONESUR, PROTEINUR, UROBILINOGEN, NITRITE, LEUKOCYTESUR in the last 72 hours.  Invalid input(s): APPERANCEUR    Imaging: No results found.   Medications:   . sodium chloride     . amLODipine  5 mg Oral Daily  . aspirin EC  81 mg Oral QHS  . calcium carbonate  1 tablet Oral TID WC  . cloNIDine  0.1 mg Oral BID  . clopidogrel  75 mg Oral Daily  . feeding supplement (ENSURE ENLIVE)  237 mL Oral TID BM  . folic acid  0.5 mg Oral Daily  . heparin  5,000 Units Subcutaneous Q8H  . hydrALAZINE  25 mg Oral Q8H  . insulin aspart  0-5 Units  Subcutaneous QHS  . insulin aspart  0-9 Units Subcutaneous TID WC  . levothyroxine  75 mcg Oral Daily  . metoprolol tartrate  50 mg Oral BID  . pravastatin  40 mg Oral QPM  . sodium chloride flush  3 mL Intravenous Q12H   sodium chloride, acetaminophen **OR** acetaminophen, albuterol, bisacodyl, hydrALAZINE, HYDROcodone-acetaminophen, ondansetron **OR** ondansetron (ZOFRAN) IV, polyvinyl alcohol, senna-docusate, sodium chloride flush  Assessment/ Plan:  [name] is a 84 y.o. white female with end stage renal disease on hemodialysis,  coronary artery disease, history of bilateral knee replacement, aortic valve replacement 2009, diabetes, hypertension, hypothyroidism, peripheral vascular disease, pulmonary hypertension, anxiety  [facility] [address] nephrology/MWF  1.  ESRD 2.  Altered mental status  3. Hypertension 4.  Anemia of chronic kidney disease  5.  Secondary hyperparathyroidism  Seen and examined on hemodialysis. Tolerating treatment well. Continue MWF schedule.   LOS: 0 Gerry [place] 4/24/20192:05 PM

## 2017-04-28 NOTE — Progress Notes (Signed)
PRE HD Assessment 

## 2017-04-28 NOTE — Progress Notes (Addendum)
Patient ready for discharge to [facility] today. Patient is aware and accepting of discharge. Left a voicemail for son Aaryn Parrilla explaining that patient will discharge to [facility] today. Per Broadus John, [facility] liason, SNF [facility] Josem Kaufmann has been received and [facility] is aware of discharge today. Discharge orders have been sent to [facility] via [facility]. CSW left dialysis coordinator Estill Bamberg a voicemail making her aware of above. CSW signing off. Please re-consult if further needs arise.   Patient's son Tallulah Hosman returned call and is in agreement with discharge to [facility] today.   Annamaria Boots MSW, [place]  [phone]

## 2017-04-28 NOTE — Progress Notes (Signed)
Sleeping on rounds. resp easy. No apparent distress.

## 2017-04-28 NOTE — Discharge Instructions (Signed)
Follow-up with primary care physician at the facility in 3 to 5 days and need staple removal in 1 week Continue hemodialysis on Monday, Wednesday and Friday and nephrology follow-up in a week

## 2017-04-28 NOTE — Progress Notes (Signed)
HD TX ended  

## 2017-04-28 NOTE — Progress Notes (Signed)
CBG at 73. OJ given. Awake and alert. Currently eating breakfast. Will continue to monitor.

## 2017-04-28 NOTE — Progress Notes (Signed)
HD tx started  

## 2017-04-28 NOTE — Progress Notes (Signed)
Pt sleeping on rounds. No apparent distress. Call bell in reach.

## 2017-04-28 NOTE — Evaluation (Signed)
Physical Therapy Evaluation Patient Details Name: [name] MRN: [id number] DOB: Mar 08, 1920 Today's Date: 04/28/2017   History of Present Illness  Patient is a 84 year old female admitted for a pelvic fracture and posterior scalp laceration following a fall.  PMH includes pulmonary Htn, PVD, OA, hypothyroidism, HLD, GERD, ESRD, DM, CAD, chronic diastolic CHF.  Clinical Impression  Patient is a 84 year old female who lives in a house alone.  Hx is difficult to obtain due to pt being [facility] and fatigued s/p hemodialysis but pt does state that she has a RW in her house and in her car.  She is in bed upon PT arrival and states that she is very cold and that she does not feel pain in area of pelvic fracture but does on her head over area of laceration.  She presented with decreased strength of bilateral UE and LE.  Pt attempted bed mobility but required max A to sit at EOB.  Pt was able to sit with feet on floor and bilateral UE.  Pt was able to stand with bilateral mod A hand held assist but was unsteady on feet and stated that she was too fatigued and in too much pain to ambulate.  PT assisted pt back in to bed and NA and PT slid pt up in bed total A.  Pt attempted bed mobility with LE's but was unable to position herself properly.  Pt will continue to benefit from skilled PT for strength, balance, tolerance to activity, functional mobility and pain management.    Follow Up Recommendations SNF    Equipment Recommendations  None recommended by PT    Recommendations for Other Services       Precautions / Restrictions Precautions Precautions: Fall Restrictions Weight Bearing Restrictions: No      Mobility  Bed Mobility Overal bed mobility: Needs Assistance Bed Mobility: Supine to Sit;Sit to Supine     Supine to sit: Max assist Sit to supine: Mod assist   General bed mobility comments: Pt requried hand held assist to sit at EOB and was able to sit with feet on floor once assisted to scoot  forward.  PT and NA scooted pt up in bed total assists. Pt able to use LE's minimally to scoot up in bed.  Transfers Overall transfer level: Needs assistance Equipment used: 1 person hand held assist Transfers: Sit to/from Stand Sit to Stand: Mod assist         General transfer comment: Assisted in initiating STS and helped to steady pt while standing.  She stated that she did not feel she would be able to walk at this time due to fatigue and severe pain in posterior scalp.  Ambulation/Gait Ambulation/Gait assistance: (Did not assess.)              Stairs            Wheelchair Mobility    Modified Rankin (Stroke Patients Only)       Balance Overall balance assessment: Needs assistance Sitting-balance support: Bilateral upper extremity supported;Feet supported       Standing balance support: Bilateral upper extremity supported   Standing balance comment: PT assisted pt in avoidance of posterior lean.                             Pertinent Vitals/Pain Pain Assessment: Faces Faces Pain Scale: Hurts whole lot Pain Location: Posterior head over area of laceration Pain Descriptors /  Indicators: Constant Pain Intervention(s): Limited activity within patient's tolerance;Monitored during session    Home Living                        Prior Function                 Hand Dominance        Extremity/Trunk Assessment   Upper Extremity Assessment Upper Extremity Assessment: Generalized weakness    Lower Extremity Assessment Lower Extremity Assessment: Generalized weakness(Pain with bilateral LE MMT.)    Cervical / Trunk Assessment Cervical / Trunk Assessment: Kyphotic  Communication      Cognition Arousal/Alertness: Lethargic Behavior During Therapy: Restless Overall Cognitive Status: Within Functional Limits for tasks assessed                                 General Comments: Pt oriented to self and situation  but appears to only be focused on conversation intermittently.      General Comments      Exercises     Assessment/Plan    PT Assessment Patient needs continued PT services  PT Problem List Decreased strength;Decreased mobility;Decreased balance;Decreased knowledge of use of DME;Pain;Decreased activity tolerance;Decreased cognition       PT Treatment Interventions DME instruction;Therapeutic activities;Gait training;Therapeutic exercise;Patient/family education;Stair training;Balance training;Functional mobility training;Neuromuscular re-education    PT Goals (Current goals can be found in the Care Plan section)  Acute Rehab PT Goals PT Goal Formulation: Patient unable to participate in goal setting    Frequency 7X/week   Barriers to discharge        Co-evaluation               AM-PAC PT "6 Clicks" Daily Activity  Outcome Measure Difficulty turning over in bed (including adjusting bedclothes, sheets and blankets)?: A Lot Difficulty moving from lying on back to sitting on the side of the bed? : A Lot Difficulty sitting down on and standing up from a chair with arms (e.g., wheelchair, bedside commode, etc,.)?: A Lot Help needed moving to and from a bed to chair (including a wheelchair)?: A Lot Help needed walking in hospital room?: A Lot Help needed climbing 3-5 steps with a railing? : A Lot 6 Click Score: 12    End of Session   Activity Tolerance: Patient limited by pain;Patient limited by fatigue Patient left: in bed;with call bell/phone within reach;with nursing/sitter in room;with bed alarm set Nurse Communication: Mobility status PT Visit Diagnosis: Unsteadiness on feet (R26.81);Muscle weakness (generalized) (M62.81);History of falling (Z91.81);Pain Pain - part of body: (Pelvis over area of fracture.)    Time: 1410-1440 PT Time Calculation (min) (ACUTE ONLY): 30 min   Charges:   PT Evaluation $PT Eval Moderate Complexity: 1 Mod     PT G Codes:   PT  G-Codes **NOT FOR INPATIENT CLASS** Functional Assessment Tool Used: AM-PAC 6 Clicks Basic Mobility    Roxanne Gates, PT, DPT   Roxanne Gates 04/28/2017, 2:47 PM

## 2017-04-28 NOTE — Discharge Summary (Addendum)
[facility] [place] NAME: [name]    MR#:  [id number]  DATE OF BIRTH:  1920-01-19  DATE OF ADMISSION:  04/26/2017 ADMITTING PHYSICIAN: Demetrios Loll, MD  DATE OF DISCHARGE:  04/28/17 PRIMARY CARE PHYSICIAN: [name], MD    ADMISSION DIAGNOSIS:  Injury of head, initial encounter [S09.90XA] Scalp laceration, initial encounter [S01.01XA] Closed nondisplaced fracture of pelvis, unspecified part of pelvis, initial encounter ([place]) [S32.9XXA]  DISCHARGE DIAGNOSIS:  Active Problems:   Pelvic fracture (HCC)  esrd SECONDARY DIAGNOSIS:   Past Medical History:  Diagnosis Date  . Anemia    NOS  . Anxiety   . Aortic stenosis   . Cancer [facility])    Mole on top of head to be removed in July 2013  . Chronic diastolic CHF (congestive heart failure) ([place])    a. 01/2017 Echo: EF 60-65%, no rwma, Gr2 DD, mild to mod MR, mildly dil LA/RA, nl RV fxn, Sev TR, PASP 9mmHg.  . Constipation   . Coronary artery disease    a. 2009 CABG x 3 w/ bioprosthetic AVR.  . Diabetes mellitus    type II;was on Glipizide but has been off x 69mon  . Dialysis patient ([place])   . Diarrhea   . ESRD (end stage renal disease) ([place])   . GERD (gastroesophageal reflux disease)   . History of blood transfusion   . Hyperlipidemia    takes Simvastatin nightly  . Hypertension    takes Metoprolol daily  . Hypothyroidism    takes Synthroid daily  . Migraine    hx of  . Oligouria   . Osteoarthritis   . Osteopenia   . Peripheral vascular disease ([place])   . Personal history of colonic polyps   . Pulmonary hypertension ([place])   . Urinary incontinence   . UTI (urinary tract infection)     HOSPITAL COURSE:    Pelvic fracture Pain management as needed, pain is manageable with the current regimen PT evaluation   fall precaution.  Hypertension urgency. Improved  After resuming home meds  home hypertension amlodipine, clonidine and hydralazine will be  continued, titrate as needed   HeadlacerationandLarge posterior scalp hematoma,status post staples Able to be removed in 7 to 10 days  ESRD. Continue scheduled hemodialysis, nephrology  following Patient had hemodialysis yesterday and today  CAD. Continue aspirin, Plavix and statin.    DISCHARGE CONDITIONS:   Stable   CONSULTS OBTAINED:  Treatment Team:  [place] Dana, MD   PROCEDURES  None   DRUG ALLERGIES:   Allergies  Allergen Reactions  . Nitrofurantoin Itching  . Captopril Other (See Comments)    REACTION: unspecified  . Enalapril Maleate Cough  . Ramipril Other (See Comments)    REACTION: unspecified  . Sulfa Antibiotics Other (See Comments)    Reaction unknown  . Verapamil Other (See Comments)    REACTION: unspecified    DISCHARGE MEDICATIONS:   Allergies as of 04/28/2017      Reactions   Nitrofurantoin Itching   Captopril Other (See Comments)   REACTION: unspecified   Enalapril Maleate Cough   Ramipril Other (See Comments)   REACTION: unspecified   Sulfa Antibiotics Other (See Comments)   Reaction unknown   Verapamil Other (See Comments)   REACTION: unspecified      Medication List    TAKE these medications   acetaminophen 325 MG tablet Commonly known as:  TYLENOL Take 1 tablet (325 mg total) by mouth every 6 (  six) hours as needed for mild pain or fever (or Fever >/= 101).   albuterol (2.5 MG/3ML) 0.083% nebulizer solution Commonly known as:  PROVENTIL Take 3 mLs (2.5 mg total) by nebulization every 2 (two) hours as needed for wheezing.   amLODipine 5 MG tablet Commonly known as:  NORVASC TAKE ONE TABLET BY MOUTH ONCE DAILY   ascorbic acid 500 MG tablet Commonly known as:  VITAMIN C Take 1 tablet (500 mg total) by mouth daily.   aspirin EC 81 MG tablet Take 81 mg by mouth at bedtime.   calcium carbonate 500 MG chewable tablet Commonly known as:  TUMS - dosed in mg elemental calcium Chew 1 tablet (200 mg of elemental  calcium total) by mouth 3 (three) times daily with meals.   cloNIDine 0.1 MG tablet Commonly known as:  CATAPRES TAKE 1 TABLET BY MOUTH TWICE DAILY   clopidogrel 75 MG tablet Commonly known as:  PLAVIX Take 1 tablet (75 mg total) by mouth daily.   feeding supplement (ENSURE ENLIVE) Liqd Take 237 mLs by mouth 3 (three) times daily between meals.   folic acid 0.5 MG tablet Commonly known as:  FOLVITE Take 0.5 tablets (0.5 mg total) by mouth daily.   Ganciclovir 0.15 % Gel Commonly known as:  ZIRGAN Place 1 drop into the right eye 4 (four) times daily.   hydrALAZINE 25 MG tablet Commonly known as:  APRESOLINE TAKE ONE TABLET BY MOUTH EVERY 8 HOURS.   HYDROcodone-acetaminophen 5-325 MG tablet Commonly known as:  NORCO/VICODIN Take 1-2 tablets by mouth every 4 (four) hours as needed for moderate pain.   levothyroxine 75 MCG tablet Commonly known as:  SYNTHROID, LEVOTHROID TAKE ONE TABLET BY MOUTH ONCE DAILY   metoprolol tartrate 50 MG tablet Commonly known as:  LOPRESSOR TAKE ONE TABLET BY MOUTH TWICE DAILY   multivitamin Tabs tablet Take 1 tablet by mouth at bedtime.   ondansetron 4 MG tablet Commonly known as:  ZOFRAN Take 1 tablet (4 mg total) by mouth every 6 (six) hours as needed for nausea.   polyvinyl alcohol 1.4 % ophthalmic solution Commonly known as:  LIQUIFILM TEARS Place 1 drop into both eyes as needed for dry eyes.   pravastatin 40 MG tablet Commonly known as:  PRAVACHOL TAKE ONE TABLET BY MOUTH IN THE EVENING   senna-docusate 8.6-50 MG tablet Commonly known as:  Senokot-S Take 1 tablet by mouth at bedtime as needed for mild constipation.        DISCHARGE INSTRUCTIONS:   Follow-up with primary care physician at the facility in 3 to 5 days and need staple removal in 1 week Continue hemodialysis on Monday, Wednesday and Friday and nephrology follow-up in a week  DIET:  Renal diet  DISCHARGE CONDITION:  Fair  ACTIVITY:  As tolerated per PT,  weightbearing as tolerated  OXYGEN:  Home Oxygen: No.   Oxygen Delivery: room air  DISCHARGE LOCATION:  SNF  If you experience worsening of your admission symptoms, develop shortness of breath, life threatening emergency, suicidal or homicidal thoughts you must seek medical attention immediately by calling 911 or calling your MD immediately  if symptoms less severe.  You Must read complete instructions/literature along with all the possible adverse reactions/side effects for all the Medicines you take and that have been prescribed to you. Take any new Medicines after you have completely understood and accpet all the possible adverse reactions/side effects.   Please note  You were cared for by a hospitalist during your hospital  stay. If you have any questions about your discharge medications or the care you received while you were in the hospital after you are discharged, you can call the unit and asked to speak with the hospitalist on call if the hospitalist that took care of you is not available. Once you are discharged, your primary care physician will handle any further medical issues. Please note that NO REFILLS for any discharge medications will be authorized once you are discharged, as it is imperative that you return to your primary care physician (or establish a relationship with a primary care physician if you do not have one) for your aftercare needs so that they can reassess your need for medications and monitor your lab values.     Today  Chief Complaint  Patient presents with  . Fall  . Laceration  . Leg Pain   Patient is doing fine.  Pain is well controlled.  Okay to go to rehab center.  She does not have good support at home and prefers going to rehab center  ROS:  CONSTITUTIONAL: Denies fevers, chills. Denies any fatigue, weakness.  EYES: Denies blurry vision, double vision, eye pain. EARS, NOSE, THROAT: Denies tinnitus, ear pain, hearing loss. RESPIRATORY: Denies  cough, wheeze, shortness of breath.  CARDIOVASCULAR: Denies chest pain, palpitations, edema.  GASTROINTESTINAL: Denies nausea, vomiting, diarrhea, abdominal pain. Denies bright red blood per rectum. GENITOURINARY: Denies dysuria, hematuria. ENDOCRINE: Denies nocturia or thyroid problems. HEMATOLOGIC AND LYMPHATIC: Denies easy bruising or bleeding. SKIN: Denies rash or lesion. MUSCULOSKELETAL: Hip pain  nEUROLOGIC: Denies paralysis, paresthesias.  PSYCHIATRIC: Denies anxiety or depressive symptoms.   VITAL SIGNS:  Blood pressure (!) 170/59, pulse 93, temperature 99.5 F (37.5 C), temperature source Oral, resp. rate 20, height 5\' 3"  (1.6 m), weight 41.8 kg (92 lb 2.4 oz), SpO2 93 %.  I/O:    Intake/Output Summary (Last 24 hours) at 04/28/2017 1436 Last data filed at 04/28/2017 1248 Gross per 24 hour  Intake 600 ml  Output 500 ml  Net 100 ml    PHYSICAL EXAMINATION:  GENERAL:  84 y.o.-year-old patient lying in the bed with no acute distress.  EYES: Pupils equal, round, reactive to light and accommodation. No scleral icterus. Extraocular muscles intact.  HEENT: Head atraumatic, normocephalic. Oropharynx and nasopharynx clear.  NECK:  Supple, no jugular venous distention. No thyroid enlargement, no tenderness.  LUNGS: Normal breath sounds bilaterally, no wheezing, rales,rhonchi or crepitation. No use of accessory muscles of respiration.  CARDIOVASCULAR: S1, S2 normal. No murmurs, rubs, or gallops.  ABDOMEN: Soft, non-tender, non-distended. Bowel sounds present. No organomegaly or mass.  EXTREMITIES: No pedal edema, cyanosis, or clubbing.  NEUROLOGIC: Cranial nerves II through XII are intact. Sensation intact. Gait not checked.  PSYCHIATRIC: The patient is alert and oriented x 3.  SKIN: No obvious rash, lesion, or ulcer.   DATA REVIEW:   CBC Recent Labs  Lab 04/28/17 0426  WBC 8.5  HGB 8.0*  HCT 23.4*  PLT 163    Chemistries  Recent Labs  Lab 04/26/17 0936  04/27/17 0558  NA 137 134*  K 3.9 4.5  CL 98* 99*  CO2 28 25  GLUCOSE 183* 95  BUN 45* 50*  CREATININE 4.73* 5.69*  CALCIUM 8.9 8.3*  AST 25  --   ALT 12*  --   ALKPHOS 159*  --   BILITOT 1.1  --     Cardiac Enzymes No results for input(s): TROPONINI in the last 168 hours.  Microbiology Results  Results  for orders placed or performed during the hospital encounter of 01/03/17  Blood Culture (routine x 2)     Status: None   Collection Time: 01/03/17  8:36 AM  Result Value Ref Range Status   Specimen Description BLOOD BLOOD RIGHT HAND  Final   Special Requests   Final    BOTTLES DRAWN AEROBIC AND ANAEROBIC Blood Culture results may not be optimal due to an inadequate volume of blood received in culture bottles   Culture   Final    NO GROWTH 5 DAYS Performed at [facility], [address] [place], [place] [address]    Report Status 01/08/2017 FINAL  Final  Blood Culture (routine x 2)     Status: None   Collection Time: 01/03/17  8:36 AM  Result Value Ref Range Status   Specimen Description BLOOD BLOOD RIGHT ARM  Final   Special Requests   Final    BOTTLES DRAWN AEROBIC AND ANAEROBIC Blood Culture adequate volume   Culture   Final    NO GROWTH 5 DAYS Performed at [facility], [address] [place], [place] [address]    Report Status 01/08/2017 FINAL  Final  Urine culture     Status: Abnormal   Collection Time: 01/03/17  8:36 AM  Result Value Ref Range Status   Specimen Description   Final    URINE, RANDOM Performed at [facility], [address], [place], [place] [address]    Special Requests   Final    NONE Performed at [facility], [address] [place], [place] [address]    Culture >=100,000 COLONIES/mL ESCHERICHIA COLI (A)  Final   Report Status 01/05/2017 FINAL  Final   Organism ID, Bacteria ESCHERICHIA COLI (A)  Final      Susceptibility   Escherichia coli - MIC*    AMPICILLIN >=32 RESISTANT Resistant      CEFAZOLIN 16 SENSITIVE Sensitive     CEFTRIAXONE <=1 SENSITIVE Sensitive     CIPROFLOXACIN >=4 RESISTANT Resistant     GENTAMICIN >=16 RESISTANT Resistant     IMIPENEM <=0.25 SENSITIVE Sensitive     NITROFURANTOIN 64 INTERMEDIATE Intermediate     TRIMETH/SULFA <=20 SENSITIVE Sensitive     AMPICILLIN/SULBACTAM >=32 RESISTANT Resistant     PIP/TAZO <=4 SENSITIVE Sensitive     Extended ESBL NEGATIVE Sensitive     * >=100,000 COLONIES/mL ESCHERICHIA COLI  MRSA PCR Screening     Status: None   Collection Time: 01/03/17  2:30 PM  Result Value Ref Range Status   MRSA by PCR NEGATIVE NEGATIVE Final    Comment:        The GeneXpert MRSA Assay (FDA approved for NASAL specimens only), is one component of a comprehensive MRSA colonization surveillance program. It is not intended to diagnose MRSA infection nor to guide or monitor treatment for MRSA infections. Performed at [facility], [address], [place], [place] [address]     RADIOLOGY:  Dg Pelvis 1-2 Views  Result Date: 04/26/2017 CLINICAL DATA:  Fall today. EXAM: PELVIS - 1-2 VIEW COMPARISON:  Abdominal radiograph 11/26/2012 FINDINGS: No visible fracture or pelvic diastasis. Limited over the central pelvis due to gas and stool. Osteopenia. Extensive atherosclerotic calcification. Partially visualized lumbar scoliosis. IMPRESSION: 1. No acute finding. 2. If focal hip pain dedicated images are recommended. Electronically Signed   By: Monte Fantasia M.D.   On: 04/26/2017 09:52   Ct Head Wo Contrast  Result Date: 04/26/2017 CLINICAL DATA:  Head  laceration after fall. EXAM: CT HEAD WITHOUT CONTRAST CT CERVICAL SPINE WITHOUT CONTRAST TECHNIQUE: Multidetector CT imaging of the head and cervical spine was performed following the standard protocol without intravenous contrast. Multiplanar CT image reconstructions of the cervical spine were also generated. COMPARISON:  CT scan of January 03, 2017. FINDINGS: CT HEAD FINDINGS Brain:  Mild diffuse cortical atrophy is noted. Mild chronic ischemic white matter disease is noted. Old right parietal infarction is noted which is unchanged. No mass effect or midline shift is noted. Ventricular size is within normal limits. There is no evidence of mass lesion, hemorrhage or acute infarction. Vascular: No hyperdense vessel or unexpected calcification. Skull: Normal. Negative for fracture or focal lesion. Sinuses/Orbits: No acute finding. Other: Large posterior scalp hematoma is noted. CT CERVICAL SPINE FINDINGS Alignment: Minimal grade 1 anterolisthesis of C6-7 is noted secondary to posterior facet joint hypertrophy. Skull base and vertebrae: No acute fracture. No primary bone lesion or focal pathologic process. Soft tissues and spinal canal: Mild bilateral apical pleural calcifications and pleural thickening is noted. Disc levels: Severe degenerative disc disease is noted at C4-5, C5-6 and C6-7. Upper chest: Biapical pleural calcifications and pleural thickening is noted. Other: Degenerative changes are seen involving the posterior facet joints bilaterally. IMPRESSION: Mild diffuse cortical atrophy. Mild chronic ischemic white matter disease. Large posterior scalp hematoma. No acute intracranial abnormality seen. Severe multilevel degenerative disc disease. No acute abnormality seen in the cervical spine. Electronically Signed   By: Marijo Conception, M.D.   On: 04/26/2017 09:44   Ct Cervical Spine Wo Contrast  Result Date: 04/26/2017 CLINICAL DATA:  Head laceration after fall. EXAM: CT HEAD WITHOUT CONTRAST CT CERVICAL SPINE WITHOUT CONTRAST TECHNIQUE: Multidetector CT imaging of the head and cervical spine was performed following the standard protocol without intravenous contrast. Multiplanar CT image reconstructions of the cervical spine were also generated. COMPARISON:  CT scan of January 03, 2017. FINDINGS: CT HEAD FINDINGS Brain: Mild diffuse cortical atrophy is noted. Mild chronic ischemic  white matter disease is noted. Old right parietal infarction is noted which is unchanged. No mass effect or midline shift is noted. Ventricular size is within normal limits. There is no evidence of mass lesion, hemorrhage or acute infarction. Vascular: No hyperdense vessel or unexpected calcification. Skull: Normal. Negative for fracture or focal lesion. Sinuses/Orbits: No acute finding. Other: Large posterior scalp hematoma is noted. CT CERVICAL SPINE FINDINGS Alignment: Minimal grade 1 anterolisthesis of C6-7 is noted secondary to posterior facet joint hypertrophy. Skull base and vertebrae: No acute fracture. No primary bone lesion or focal pathologic process. Soft tissues and spinal canal: Mild bilateral apical pleural calcifications and pleural thickening is noted. Disc levels: Severe degenerative disc disease is noted at C4-5, C5-6 and C6-7. Upper chest: Biapical pleural calcifications and pleural thickening is noted. Other: Degenerative changes are seen involving the posterior facet joints bilaterally. IMPRESSION: Mild diffuse cortical atrophy. Mild chronic ischemic white matter disease. Large posterior scalp hematoma. No acute intracranial abnormality seen. Severe multilevel degenerative disc disease. No acute abnormality seen in the cervical spine. Electronically Signed   By: Marijo Conception, M.D.   On: 04/26/2017 09:44   Ct Hip Right Wo Contrast  Result Date: 04/26/2017 CLINICAL DATA:  Right hip pain secondary to a fall on 04/26/2017. EXAM: CT OF THE RIGHT HIP WITHOUT CONTRAST TECHNIQUE: Multidetector CT imaging of the right hip was performed according to the standard protocol. Multiplanar CT image reconstructions were also generated. COMPARISON:  Radiographs dated 04/26/2017 FINDINGS: Bones/Joint/Cartilage There  is a subtle nondisplaced fracture of the right inferior pubic ramus. Superior pubic ramus and acetabulum are intact. Proximal left femur is intact. Arthritic changes of the superior aspect of  the right acetabulum with prominent subcortical cysts and cortical defects in the roof of the acetabulum. Muscles and Tendons Normal. Soft tissues Moderate ascites in the pelvis of unknown etiology. IMPRESSION: 1. Nondisplaced fracture of right inferior pubic ramus. 2. Intact proximal right femur. 3. Arthritic changes of the right acetabulum. 4. Nonspecific ascites. Electronically Signed   By: Lorriane Shire M.D.   On: 04/26/2017 12:57   Dg Hip Unilat With Pelvis 2-3 Views Right  Result Date: 04/26/2017 CLINICAL DATA:  Patient reports falling down 5 DIS 6 steps striking a cement surface. EXAM: DG HIP (WITH OR WITHOUT PELVIS) 2-3V RIGHT COMPARISON:  AP view of the pelvis of today's date FINDINGS: The bones are subjectively osteopenic. The right hip joint space is well maintained. The articular surfaces of the right femoral head and acetabulum remains smoothly rounded. The femoral neck, intertrochanteric, and subtrochanteric regions are normal. IMPRESSION: There is no acute fracture of the right hip. Electronically Signed   By: David  Martinique M.D.   On: 04/26/2017 12:01    EKG:   Orders placed or performed in visit on 03/16/17  . EKG 12-Lead      Management plans discussed with the patient, family and they are in agreement.  CODE STATUS:     Code Status Orders  (From admission, onward)        Start     Ordered   04/26/17 1710  Do not attempt resuscitation (DNR)  Continuous    Question Answer Comment  In the event of cardiac or respiratory ARREST Do not call a "code blue"   In the event of cardiac or respiratory ARREST Do not perform Intubation, CPR, defibrillation or ACLS   In the event of cardiac or respiratory ARREST Use medication by any route, position, wound care, and other measures to relive pain and suffering. May use oxygen, suction and manual treatment of airway obstruction as needed for comfort.      04/26/17 1709    Code Status History    Date Active Date Inactive Code  Status Order ID Comments User Context   01/03/2017 1328 01/08/2017 2014 DNR [id number]  Idelle Crouch, MD Inpatient   01/03/2017 1031 01/03/2017 1328 DNR [id number]  Merlyn Lot, MD ED   09/18/2015 1817 09/22/2015 1920 DNR [id number]  Fritzi Mandes, MD Inpatient   09/18/2015 1516 09/18/2015 1817 DNR [id number]  Flora Lipps, MD ED   03/22/2013 2100 03/26/2013 1540 Full Code [id number]  Rise Patience, MD Inpatient   11/20/2012 1756 12/06/2012 2008 DNR [id number]  Annita Brod, MD Inpatient   06/18/2011 1042 06/19/2011 1401 Full Code [id number]  Paulene Floor, RN Inpatient   12/14/2010 1124 12/29/2010 1543 DNR [id number]  Bonnielee Haff, MD Inpatient   12/13/2010 0113 12/14/2010 1123 Full Code [id number]  Sid Falcon, RN Inpatient   11/22/2010 0216 11/28/2010 2239 DNR [id number]  Viviano Simas, RN Inpatient      TOTAL TIME TAKING CARE OF THIS PATIENT: 43 minutes.   Note: This dictation was prepared with Dragon dictation along with smaller phrase technology. Any transcriptional errors that result from this process are unintentional.   @[facility] @  on 04/28/2017 at 2:36 PM  Between 7am to 6pm - Pager - [phone]  After 6pm go to [email or web address] - password Child psychotherapist Hospitalists  Office  [phone]  CC: Primary care physician; [name], MD

## 2017-04-28 NOTE — Progress Notes (Signed)
Pre-HD tx 

## 2017-04-30 DIAGNOSIS — N186 End stage renal disease: Secondary | ICD-10-CM | POA: Diagnosis not present

## 2017-04-30 DIAGNOSIS — N2581 Secondary hyperparathyroidism of renal origin: Secondary | ICD-10-CM | POA: Diagnosis not present

## 2017-04-30 DIAGNOSIS — D631 Anemia in chronic kidney disease: Secondary | ICD-10-CM | POA: Diagnosis not present

## 2017-05-02 DIAGNOSIS — I10 Essential (primary) hypertension: Secondary | ICD-10-CM | POA: Diagnosis not present

## 2017-05-02 DIAGNOSIS — E039 Hypothyroidism, unspecified: Secondary | ICD-10-CM | POA: Diagnosis not present

## 2017-05-02 DIAGNOSIS — E119 Type 2 diabetes mellitus without complications: Secondary | ICD-10-CM | POA: Diagnosis not present

## 2017-05-02 DIAGNOSIS — N186 End stage renal disease: Secondary | ICD-10-CM | POA: Diagnosis not present

## 2017-05-02 DIAGNOSIS — S329XXD Fracture of unspecified parts of lumbosacral spine and pelvis, subsequent encounter for fracture with routine healing: Secondary | ICD-10-CM | POA: Diagnosis not present

## 2017-05-03 ENCOUNTER — Ambulatory Visit: Payer: Medicare Other | Admitting: Internal Medicine

## 2017-05-03 DIAGNOSIS — N186 End stage renal disease: Secondary | ICD-10-CM | POA: Diagnosis not present

## 2017-05-03 DIAGNOSIS — N2581 Secondary hyperparathyroidism of renal origin: Secondary | ICD-10-CM | POA: Diagnosis not present

## 2017-05-03 DIAGNOSIS — D631 Anemia in chronic kidney disease: Secondary | ICD-10-CM | POA: Diagnosis not present

## 2017-05-05 ENCOUNTER — Encounter: Payer: Self-pay | Admitting: Emergency Medicine

## 2017-05-05 ENCOUNTER — Inpatient Hospital Stay
Admission: EM | Admit: 2017-05-05 | Discharge: 2017-05-07 | DRG: 377 | Disposition: A | Discharge status: Skilled Nursing Facility | Payer: Medicare Other | Attending: Internal Medicine | Admitting: Internal Medicine

## 2017-05-05 ENCOUNTER — Other Ambulatory Visit: Payer: Self-pay

## 2017-05-05 ENCOUNTER — Emergency Department: Payer: Medicare Other

## 2017-05-05 DIAGNOSIS — N2581 Secondary hyperparathyroidism of renal origin: Secondary | ICD-10-CM | POA: Diagnosis not present

## 2017-05-05 DIAGNOSIS — D631 Anemia in chronic kidney disease: Secondary | ICD-10-CM | POA: Diagnosis not present

## 2017-05-05 DIAGNOSIS — Z992 Dependence on renal dialysis: Secondary | ICD-10-CM | POA: Diagnosis not present

## 2017-05-05 DIAGNOSIS — K5792 Diverticulitis of intestine, part unspecified, without perforation or abscess without bleeding: Secondary | ICD-10-CM | POA: Diagnosis not present

## 2017-05-05 DIAGNOSIS — Z951 Presence of aortocoronary bypass graft: Secondary | ICD-10-CM | POA: Diagnosis not present

## 2017-05-05 DIAGNOSIS — I10 Essential (primary) hypertension: Secondary | ICD-10-CM | POA: Diagnosis not present

## 2017-05-05 DIAGNOSIS — F0390 Unspecified dementia without behavioral disturbance: Secondary | ICD-10-CM | POA: Diagnosis present

## 2017-05-05 DIAGNOSIS — I132 Hypertensive heart and chronic kidney disease with heart failure and with stage 5 chronic kidney disease, or end stage renal disease: Secondary | ICD-10-CM | POA: Diagnosis not present

## 2017-05-05 DIAGNOSIS — E1151 Type 2 diabetes mellitus with diabetic peripheral angiopathy without gangrene: Secondary | ICD-10-CM | POA: Diagnosis not present

## 2017-05-05 DIAGNOSIS — K5732 Diverticulitis of large intestine without perforation or abscess without bleeding: Secondary | ICD-10-CM | POA: Diagnosis not present

## 2017-05-05 DIAGNOSIS — M1990 Unspecified osteoarthritis, unspecified site: Secondary | ICD-10-CM | POA: Diagnosis not present

## 2017-05-05 DIAGNOSIS — M8580 Other specified disorders of bone density and structure, unspecified site: Secondary | ICD-10-CM | POA: Diagnosis present

## 2017-05-05 DIAGNOSIS — S329XXD Fracture of unspecified parts of lumbosacral spine and pelvis, subsequent encounter for fracture with routine healing: Secondary | ICD-10-CM | POA: Diagnosis not present

## 2017-05-05 DIAGNOSIS — R262 Difficulty in walking, not elsewhere classified: Secondary | ICD-10-CM | POA: Diagnosis not present

## 2017-05-05 DIAGNOSIS — S3282XA Multiple fractures of pelvis without disruption of pelvic ring, initial encounter for closed fracture: Secondary | ICD-10-CM

## 2017-05-05 DIAGNOSIS — H9190 Unspecified hearing loss, unspecified ear: Secondary | ICD-10-CM | POA: Diagnosis present

## 2017-05-05 DIAGNOSIS — E039 Hypothyroidism, unspecified: Secondary | ICD-10-CM | POA: Diagnosis not present

## 2017-05-05 DIAGNOSIS — Z8673 Personal history of transient ischemic attack (TIA), and cerebral infarction without residual deficits: Secondary | ICD-10-CM | POA: Diagnosis not present

## 2017-05-05 DIAGNOSIS — I5032 Chronic diastolic (congestive) heart failure: Secondary | ICD-10-CM | POA: Diagnosis present

## 2017-05-05 DIAGNOSIS — Z953 Presence of xenogenic heart valve: Secondary | ICD-10-CM

## 2017-05-05 DIAGNOSIS — I272 Pulmonary hypertension, unspecified: Secondary | ICD-10-CM | POA: Diagnosis not present

## 2017-05-05 DIAGNOSIS — Z888 Allergy status to other drugs, medicaments and biological substances status: Secondary | ICD-10-CM

## 2017-05-05 DIAGNOSIS — N186 End stage renal disease: Secondary | ICD-10-CM | POA: Diagnosis not present

## 2017-05-05 DIAGNOSIS — K5733 Diverticulitis of large intestine without perforation or abscess with bleeding: Principal | ICD-10-CM | POA: Diagnosis present

## 2017-05-05 DIAGNOSIS — K922 Gastrointestinal hemorrhage, unspecified: Secondary | ICD-10-CM | POA: Diagnosis not present

## 2017-05-05 DIAGNOSIS — K219 Gastro-esophageal reflux disease without esophagitis: Secondary | ICD-10-CM | POA: Diagnosis present

## 2017-05-05 DIAGNOSIS — I2510 Atherosclerotic heart disease of native coronary artery without angina pectoris: Secondary | ICD-10-CM | POA: Diagnosis present

## 2017-05-05 DIAGNOSIS — Z86010 Personal history of colonic polyps: Secondary | ICD-10-CM | POA: Diagnosis not present

## 2017-05-05 DIAGNOSIS — I509 Heart failure, unspecified: Secondary | ICD-10-CM | POA: Diagnosis not present

## 2017-05-05 DIAGNOSIS — E785 Hyperlipidemia, unspecified: Secondary | ICD-10-CM | POA: Diagnosis present

## 2017-05-05 DIAGNOSIS — Z7982 Long term (current) use of aspirin: Secondary | ICD-10-CM

## 2017-05-05 DIAGNOSIS — R42 Dizziness and giddiness: Secondary | ICD-10-CM | POA: Diagnosis not present

## 2017-05-05 DIAGNOSIS — I959 Hypotension, unspecified: Secondary | ICD-10-CM | POA: Diagnosis present

## 2017-05-05 DIAGNOSIS — S32591A Other specified fracture of right pubis, initial encounter for closed fracture: Secondary | ICD-10-CM | POA: Diagnosis present

## 2017-05-05 DIAGNOSIS — Z79890 Hormone replacement therapy (postmenopausal): Secondary | ICD-10-CM

## 2017-05-05 DIAGNOSIS — Z90710 Acquired absence of both cervix and uterus: Secondary | ICD-10-CM | POA: Diagnosis not present

## 2017-05-05 DIAGNOSIS — E1122 Type 2 diabetes mellitus with diabetic chronic kidney disease: Secondary | ICD-10-CM | POA: Diagnosis present

## 2017-05-05 DIAGNOSIS — K5713 Diverticulitis of small intestine without perforation or abscess with bleeding: Secondary | ICD-10-CM | POA: Diagnosis not present

## 2017-05-05 DIAGNOSIS — N390 Urinary tract infection, site not specified: Secondary | ICD-10-CM | POA: Diagnosis not present

## 2017-05-05 DIAGNOSIS — Z882 Allergy status to sulfonamides status: Secondary | ICD-10-CM

## 2017-05-05 DIAGNOSIS — Z9849 Cataract extraction status, unspecified eye: Secondary | ICD-10-CM

## 2017-05-05 DIAGNOSIS — Z66 Do not resuscitate: Secondary | ICD-10-CM | POA: Diagnosis present

## 2017-05-05 DIAGNOSIS — E43 Unspecified severe protein-calorie malnutrition: Secondary | ICD-10-CM | POA: Diagnosis not present

## 2017-05-05 DIAGNOSIS — Z681 Body mass index (BMI) 19 or less, adult: Secondary | ICD-10-CM

## 2017-05-05 DIAGNOSIS — K8020 Calculus of gallbladder without cholecystitis without obstruction: Secondary | ICD-10-CM | POA: Diagnosis not present

## 2017-05-05 DIAGNOSIS — Z96653 Presence of artificial knee joint, bilateral: Secondary | ICD-10-CM | POA: Diagnosis present

## 2017-05-05 DIAGNOSIS — E119 Type 2 diabetes mellitus without complications: Secondary | ICD-10-CM | POA: Diagnosis not present

## 2017-05-05 DIAGNOSIS — Z7401 Bed confinement status: Secondary | ICD-10-CM | POA: Diagnosis not present

## 2017-05-05 DIAGNOSIS — M6281 Muscle weakness (generalized): Secondary | ICD-10-CM | POA: Diagnosis not present

## 2017-05-05 DIAGNOSIS — Z7902 Long term (current) use of antithrombotics/antiplatelets: Secondary | ICD-10-CM

## 2017-05-05 DIAGNOSIS — Z87891 Personal history of nicotine dependence: Secondary | ICD-10-CM

## 2017-05-05 DIAGNOSIS — E569 Vitamin deficiency, unspecified: Secondary | ICD-10-CM | POA: Diagnosis not present

## 2017-05-05 LAB — COMPREHENSIVE METABOLIC PANEL
ALT: 8 U/L — AB (ref 14–54)
AST: 25 U/L (ref 15–41)
Albumin: 2.8 g/dL — ABNORMAL LOW (ref 3.5–5.0)
Alkaline Phosphatase: 141 U/L — ABNORMAL HIGH (ref 38–126)
Anion gap: 11 (ref 5–15)
BUN: 26 mg/dL — AB (ref 6–20)
CALCIUM: 7.8 mg/dL — AB (ref 8.9–10.3)
CHLORIDE: 94 mmol/L — AB (ref 101–111)
CO2: 29 mmol/L (ref 22–32)
CREATININE: 3.08 mg/dL — AB (ref 0.44–1.00)
GFR, EST AFRICAN AMERICAN: 14 mL/min — AB (ref >60)
GFR, EST NON AFRICAN AMERICAN: 12 mL/min — AB (ref >60)
Glucose, Bld: 123 mg/dL — ABNORMAL HIGH (ref 65–99)
Potassium: 3.8 mmol/L (ref 3.5–5.1)
Sodium: 134 mmol/L — ABNORMAL LOW (ref 135–145)
Total Bilirubin: 1.2 mg/dL (ref 0.3–1.2)
Total Protein: 7 g/dL (ref 6.5–8.1)

## 2017-05-05 LAB — CBC
HCT: 23.6 % — ABNORMAL LOW (ref 35.0–47.0)
Hemoglobin: 7.8 g/dL — ABNORMAL LOW (ref 12.0–16.0)
MCH: 35.4 pg — AB (ref 26.0–34.0)
MCHC: 33.1 g/dL (ref 32.0–36.0)
MCV: 107.1 fL — AB (ref 80.0–100.0)
Platelets: 189 10*3/uL (ref 150–440)
RBC: 2.21 MIL/uL — AB (ref 3.80–5.20)
RDW: 17.6 % — AB (ref 11.5–14.5)
WBC: 22.5 10*3/uL — AB (ref 3.6–11.0)

## 2017-05-05 LAB — HEMOGLOBIN A1C
HEMOGLOBIN A1C: 5 % (ref 4.8–5.6)
Mean Plasma Glucose: 96.8 mg/dL

## 2017-05-05 LAB — APTT: APTT: 33 s (ref 24–36)

## 2017-05-05 LAB — PROTIME-INR
INR: 1.27
Prothrombin Time: 15.8 seconds — ABNORMAL HIGH (ref 11.4–15.2)

## 2017-05-05 LAB — GLUCOSE, CAPILLARY
GLUCOSE-CAPILLARY: 142 mg/dL — AB (ref 65–99)
Glucose-Capillary: 115 mg/dL — ABNORMAL HIGH (ref 65–99)

## 2017-05-05 LAB — ABO/RH: ABO/RH(D): O POS

## 2017-05-05 LAB — LACTIC ACID, PLASMA: LACTIC ACID, VENOUS: 2.1 mmol/L — AB (ref 0.5–1.9)

## 2017-05-05 MED ORDER — PIPERACILLIN-TAZOBACTAM 3.375 G IVPB 30 MIN: 3.3750 g | Freq: Once | INTRAVENOUS | Status: DC

## 2017-05-05 MED ORDER — POLYETHYLENE GLYCOL 3350 17 G PO PACK: 17.0000 g | PACK | Freq: Every day | ORAL | Status: DC | PRN

## 2017-05-05 MED ORDER — METRONIDAZOLE 500 MG PO TABS
500.0000 mg | ORAL_TABLET | Freq: Three times a day (TID) | ORAL | Status: DC
  Administered 2017-05-05 – 2017-05-06 (×3): 500 mg via ORAL
  Filled 2017-05-05 (×5): qty 1

## 2017-05-05 MED ORDER — ACETAMINOPHEN 650 MG RE SUPP: 650.0000 mg | Freq: Four times a day (QID) | RECTAL | Status: DC | PRN

## 2017-05-05 MED ORDER — CIPROFLOXACIN IN D5W 400 MG/200ML IV SOLN
400.0000 mg | INTRAVENOUS | Status: DC
  Administered 2017-05-05: 400 mg via INTRAVENOUS
  Filled 2017-05-05 (×2): qty 200

## 2017-05-05 MED ORDER — ORAL CARE MOUTH RINSE
15.0000 mL | Freq: Two times a day (BID) | OROMUCOSAL | Status: DC
  Administered 2017-05-05 – 2017-05-07 (×3): 15 mL via OROMUCOSAL

## 2017-05-05 MED ORDER — FOLIC ACID 1 MG PO TABS
0.5000 mg | ORAL_TABLET | Freq: Every day | ORAL | Status: DC
  Administered 2017-05-06 – 2017-05-07 (×2): 0.5 mg via ORAL
  Filled 2017-05-05 (×2): qty 1

## 2017-05-05 MED ORDER — HYDROCODONE-ACETAMINOPHEN 5-325 MG PO TABS
1.0000 | ORAL_TABLET | ORAL | Status: DC | PRN
  Administered 2017-05-05: 1 via ORAL
  Filled 2017-05-05: qty 1

## 2017-05-05 MED ORDER — POLYVINYL ALCOHOL 1.4 % OP SOLN
1.0000 [drp] | OPHTHALMIC | Status: DC | PRN
  Administered 2017-05-05 – 2017-05-07 (×4): 1 [drp] via OPHTHALMIC
  Filled 2017-05-05 (×2): qty 15

## 2017-05-05 MED ORDER — SODIUM CHLORIDE 0.9 % IV SOLN
10.0000 mL/h | Freq: Once | INTRAVENOUS | Status: AC
  Administered 2017-05-05: 10 mL/h via INTRAVENOUS

## 2017-05-05 MED ORDER — ONDANSETRON HCL 4 MG/2ML IJ SOLN: 4.0000 mg | Freq: Four times a day (QID) | INTRAMUSCULAR | Status: DC | PRN

## 2017-05-05 MED ORDER — ENSURE ENLIVE PO LIQD
237.0000 mL | Freq: Three times a day (TID) | ORAL | Status: DC
  Administered 2017-05-05 – 2017-05-07 (×2): 237 mL via ORAL

## 2017-05-05 MED ORDER — ALBUTEROL SULFATE (2.5 MG/3ML) 0.083% IN NEBU: 2.5000 mg | INHALATION_SOLUTION | RESPIRATORY_TRACT | Status: DC | PRN

## 2017-05-05 MED ORDER — ACETAMINOPHEN 325 MG PO TABS
650.0000 mg | ORAL_TABLET | Freq: Four times a day (QID) | ORAL | Status: DC | PRN
  Administered 2017-05-06: 650 mg via ORAL
  Filled 2017-05-05: qty 2

## 2017-05-05 MED ORDER — GANCICLOVIR 0.15 % OP GEL
1.0000 [drp] | Freq: Four times a day (QID) | OPHTHALMIC | Status: DC | PRN
  Administered 2017-05-05: 1 [drp] via OPHTHALMIC
  Filled 2017-05-05 (×3): qty 5

## 2017-05-05 MED ORDER — LEVOTHYROXINE SODIUM 50 MCG PO TABS
75.0000 ug | ORAL_TABLET | Freq: Every day | ORAL | Status: DC
  Administered 2017-05-07: 75 ug via ORAL
  Filled 2017-05-05 (×2): qty 2

## 2017-05-05 MED ORDER — ONDANSETRON HCL 4 MG PO TABS: 4.0000 mg | ORAL_TABLET | Freq: Four times a day (QID) | ORAL | Status: DC | PRN

## 2017-05-05 MED ORDER — HYDRALAZINE HCL 25 MG PO TABS
25.0000 mg | ORAL_TABLET | Freq: Three times a day (TID) | ORAL | Status: DC
  Administered 2017-05-05 – 2017-05-07 (×4): 25 mg via ORAL
  Filled 2017-05-05 (×4): qty 1

## 2017-05-05 MED ORDER — METOPROLOL TARTRATE 50 MG PO TABS
50.0000 mg | ORAL_TABLET | Freq: Two times a day (BID) | ORAL | Status: DC
  Administered 2017-05-05 – 2017-05-06 (×2): 50 mg via ORAL
  Filled 2017-05-05 (×3): qty 1

## 2017-05-05 MED ORDER — INSULIN ASPART 100 UNIT/ML ~~LOC~~ SOLN
0.0000 [IU] | SUBCUTANEOUS | Status: DC
  Administered 2017-05-05: 1 [IU] via SUBCUTANEOUS
  Administered 2017-05-06 (×2): 2 [IU] via SUBCUTANEOUS
  Administered 2017-05-07: 1 [IU] via SUBCUTANEOUS
  Administered 2017-05-07 (×3): 2 [IU] via SUBCUTANEOUS
  Filled 2017-05-05 (×7): qty 1

## 2017-05-05 NOTE — H&P (Signed)
[name] at [place] NAME: [name]    MR#:  [id number]  DATE OF BIRTH:  11/25/1920  DATE OF ADMISSION:  05/05/2017  PRIMARY CARE PHYSICIAN: [name], MD   REQUESTING/REFERRING PHYSICIAN: Dr. Alfred Levins  CHIEF COMPLAINT:   Chief Complaint  Patient presents with  . Hypotension  . Rectal Bleeding    HISTORY OF PRESENT ILLNESS:  [name]  is a 84 y.o. female with a known history of diabetes mellitus, hypertension, CAD status post CABG, CVA, end-stage renal disease on hemodialysis presents to the emergency room after she was noticed to be hypotensive in the dialysis center with rectal bleeding.  Here patient's blood pressure continued to be low.  Mild lower abdominal discomfort.  Elevated white count and CT scan showing sigmoid diverticulitis.  Patient has acute blood loss anemia and is being admitted to the hospitalist service.  Discussed with Dr. Candiss Norse of nephrology.  Patient is on aspirin and Plavix.  PAST MEDICAL HISTORY:   Past Medical History:  Diagnosis Date  . Anemia    NOS  . Anxiety   . Aortic stenosis   . Cancer [facility])    Mole on top of head to be removed in July 2013  . Chronic diastolic CHF (congestive heart failure) ([place])    a. 01/2017 Echo: EF 60-65%, no rwma, Gr2 DD, mild to mod MR, mildly dil LA/RA, nl RV fxn, Sev TR, PASP 44mmHg.  . Constipation   . Coronary artery disease    a. 2009 CABG x 3 w/ bioprosthetic AVR.  . Diabetes mellitus    type II;was on Glipizide but has been off x 44mon  . Dialysis patient ([place])   . Diarrhea   . ESRD (end stage renal disease) ([place])   . GERD (gastroesophageal reflux disease)   . History of blood transfusion   . Hyperlipidemia    takes Simvastatin nightly  . Hypertension    takes Metoprolol daily  . Hypothyroidism    takes Synthroid daily  . Migraine    hx of  . Oligouria   . Osteoarthritis   . Osteopenia   . Peripheral vascular disease ([place])   . Personal history of  colonic polyps   . Pulmonary hypertension ([place])   . Urinary incontinence   . UTI (urinary tract infection)     PAST SURGICAL HISTORY:   Past Surgical History:  Procedure Laterality Date  . A/V FISTULAGRAM [name] 01/07/2017   Procedure: A/V Fistulagram;  Surgeon: Algernon Huxley, MD;  Location: [place] CV LAB;  Service: Cardiovascular;  Laterality: [name];  . A/V SHUNT INTERVENTION [name] 01/07/2017   Procedure: A/V SHUNT INTERVENTION;  Surgeon: Algernon Huxley, MD;  Location: [place] CV LAB;  Service: Cardiovascular;  Laterality: [name];  . ABDOMINAL HYSTERECTOMY    . adenosine myoview  2007   benign, EF 69%  . AMPUTATION  06/18/2011   Procedure: AMPUTATION DIGIT;  Surgeon: Newt Minion, MD;  Location: [place];  Service: Orthopedics;  Laterality: Left;  Left 2nd Toe Amputation at MTP Joint  . Amputation-left great toe  7/12   Dr Marlou Sa  . AORTIC VALVE REPLACEMENT  2009  . APPENDECTOMY    . AV FISTULA PLACEMENT  12/22/2010   Procedure: ARTERIOVENOUS (AV) FISTULA CREATION;  Surgeon: Hinda Lenis, MD;  Location: [place];  Service: Vascular;  Laterality: Left;  Creation of Left Brachial-Cephalic Fistula  . CARDIAC CATHETERIZATION  2000   cad  . CAROTID ENDARTERECTOMY  2011   Right  . CATARACT EXTRACTION    . CORONARY ARTERY BYPASS GRAFT     od  . CORONARY ARTERY BYPASS GRAFT  2009  . EYE SURGERY     BIlateral  . INSERTION OF DIALYSIS CATHETER  12/18/2010   Procedure: INSERTION OF DIALYSIS CATHETER;  Surgeon: Mal Misty, MD;  Location: [place];  Service: Vascular;  Laterality: Right;  . REPLACEMENT TOTAL KNEE BILATERAL  05/1998  . TONSILLECTOMY    . Traumatic Amputation of Right DIP joint of Index Finger      SOCIAL HISTORY:   Social History   Tobacco Use  . Smoking status: Former Smoker    Years: 15.00    Types: Cigarettes    Last attempt to quit: 01/06/1943    Years since quitting: 74.3  . Smokeless tobacco: Never Used  Substance Use Topics  . Alcohol use: Yes    Comment:  Wine occasionally    FAMILY HISTORY:   Family History  Family history unknown: Yes    DRUG ALLERGIES:   Allergies  Allergen Reactions  . Nitrofurantoin Itching  . Captopril Other (See Comments)    REACTION: unspecified  . Enalapril Maleate Cough  . Ramipril Other (See Comments)    REACTION: unspecified  . Sulfa Antibiotics Other (See Comments)    Reaction unknown  . Verapamil Other (See Comments)    REACTION: unspecified    REVIEW OF SYSTEMS:   Review of Systems  Constitutional: Positive for malaise/fatigue. Negative for chills and fever.  HENT: Negative for sore throat.   Eyes: Negative for blurred vision, double vision and pain.  Respiratory: Negative for cough, hemoptysis, shortness of breath and wheezing.   Cardiovascular: Negative for chest pain, palpitations, orthopnea and leg swelling.  Gastrointestinal: Positive for abdominal pain and blood in stool. Negative for constipation, diarrhea, heartburn, nausea and vomiting.  Genitourinary: Negative for dysuria and hematuria.  Musculoskeletal: Negative for back pain and joint pain.  Skin: Negative for rash.  Neurological: Negative for sensory change, speech change, focal weakness and headaches.  Endo/Heme/Allergies: Does not bruise/bleed easily.  Psychiatric/Behavioral: Negative for depression. The patient is not nervous/anxious.     MEDICATIONS AT HOME:   Prior to Admission medications   Medication Sig Start Date End Date Taking? Authorizing Provider  amLODipine (NORVASC) 5 MG tablet TAKE ONE TABLET BY MOUTH ONCE DAILY 02/16/17  Yes Viviana Simpler I, MD  aspirin EC 81 MG tablet Take 81 mg by mouth at bedtime.   Yes [provider]  calcium carbonate (TUMS - DOSED IN MG ELEMENTAL CALCIUM) 500 MG chewable tablet Chew 1 tablet (200 mg of elemental calcium total) by mouth 3 (three) times daily with meals. 12/06/12  Yes Vann, Jessica U, DO  cloNIDine (CATAPRES) 0.1 MG tablet TAKE 1 TABLET BY MOUTH TWICE DAILY  02/16/17  Yes [name], MD  clopidogrel (PLAVIX) 75 MG tablet Take 1 tablet (75 mg total) by mouth daily. 02/23/17  Yes [name], MD  folic acid (FOLVITE) 0.5 MG tablet Take 0.5 tablets (0.5 mg total) by mouth daily. 12/06/12  Yes Vann, Jessica U, DO  hydrALAZINE (APRESOLINE) 25 MG tablet TAKE ONE TABLET BY MOUTH EVERY 8 HOURS. 04/01/16  Yes [name], MD  levothyroxine (SYNTHROID, LEVOTHROID) 75 MCG tablet TAKE ONE TABLET BY MOUTH ONCE DAILY 07/07/16  Yes [name], MD  metoprolol tartrate (LOPRESSOR) 50 MG tablet TAKE ONE TABLET BY MOUTH TWICE DAILY 02/05/17  Yes Gollan, Kathlene November, MD  multivitamin (  RENA-VIT) TABS tablet Take 1 tablet by mouth at bedtime. 01/08/17  Yes Fritzi Mandes, MD  pravastatin (PRAVACHOL) 40 MG tablet TAKE ONE TABLET BY MOUTH IN THE EVENING 07/21/16  Yes Gollan, Kathlene November, MD  vitamin C (VITAMIN C) 500 MG tablet Take 1 tablet (500 mg total) by mouth daily. 12/06/12  Yes Geradine Girt, DO  acetaminophen (TYLENOL) 325 MG tablet Take 1 tablet (325 mg total) by mouth every 6 (six) hours as needed for mild pain or fever (or Fever >/= 101). 04/28/17   Gouru, Aruna, MD  albuterol (PROVENTIL) (2.5 MG/3ML) 0.083% nebulizer solution Take 3 mLs (2.5 mg total) by nebulization every 2 (two) hours as needed for wheezing. 04/28/17   Nicholes Mango, MD  feeding supplement, ENSURE ENLIVE, (ENSURE ENLIVE) LIQD Take 237 mLs by mouth 3 (three) times daily between meals. 01/08/17   Fritzi Mandes, MD  Ganciclovir (ZIRGAN) 0.15 % GEL Place 1 drop into the right eye 4 (four) times daily. 01/08/17   Fritzi Mandes, MD  HYDROcodone-acetaminophen (NORCO/VICODIN) 5-325 MG tablet Take 1-2 tablets by mouth every 4 (four) hours as needed for moderate pain. 04/28/17   Gouru, Illene Silver, MD  ondansetron (ZOFRAN) 4 MG tablet Take 1 tablet (4 mg total) by mouth every 6 (six) hours as needed for nausea. 04/28/17   Nicholes Mango, MD  polyvinyl alcohol (LIQUIFILM TEARS) 1.4 % ophthalmic solution Place 1 drop into  both eyes as needed for dry eyes. 09/22/15   Fritzi Mandes, MD  senna-docusate (SENOKOT-S) 8.6-50 MG tablet Take 1 tablet by mouth at bedtime as needed for mild constipation. 04/28/17   Nicholes Mango, MD     VITAL SIGNS:  Blood pressure (!) 122/38, pulse 67, temperature 97.6 F (36.4 C), temperature source Oral, resp. rate 16, height 5\' 3"  (1.6 m), weight 48.2 kg (106 lb 3.2 oz), SpO2 98 %.  PHYSICAL EXAMINATION:  Physical Exam  GENERAL:  84 y.o.-year-old patient lying in the bed .  Frail. Decreased hearing EYES: Pupils equal, round, reactive to light and accommodation. No scleral icterus. Extraocular muscles intact.  HEENT: Head atraumatic, normocephalic. Oropharynx and nasopharynx clear. No oropharyngeal erythema, moist oral mucosa  NECK:  Supple, no jugular venous distention. No thyroid enlargement, no tenderness.  LUNGS: Normal breath sounds bilaterally, no wheezing, rales, rhonchi. No use of accessory muscles of respiration.  CARDIOVASCULAR: S1, S2 normal. No murmurs, rubs, or gallops.  ABDOMEN: Soft, nontender, nondistended. Bowel sounds present. No organomegaly or mass.  EXTREMITIES: No pedal edema, cyanosis, or clubbing. + 2 pedal & radial pulses b/l.   NEUROLOGIC: Cranial nerves II through XII are intact. No focal Motor or sensory deficits appreciated b/l PSYCHIATRIC: The patient is alert and awake SKIN: No obvious rash, lesion, or ulcer.   LABORATORY PANEL:   CBC Recent Labs  Lab 05/05/17 1312  WBC 22.5*  HGB 7.8*  HCT 23.6*  PLT 189   ------------------------------------------------------------------------------------------------------------------  Chemistries  Recent Labs  Lab 05/05/17 1312  NA 134*  K 3.8  CL 94*  CO2 29  GLUCOSE 123*  BUN 26*  CREATININE 3.08*  CALCIUM 7.8*  AST 25  ALT 8*  ALKPHOS 141*  BILITOT 1.2   ------------------------------------------------------------------------------------------------------------------  Cardiac Enzymes No  results for input(s): TROPONINI in the last 168 hours. ------------------------------------------------------------------------------------------------------------------  RADIOLOGY:  Ct Abdomen Pelvis Wo Contrast  Result Date: 05/05/2017 CLINICAL DATA:  Hypotension. EXAM: CT ABDOMEN AND PELVIS WITHOUT CONTRAST TECHNIQUE: Multidetector CT imaging of the abdomen and pelvis was performed following the standard protocol without IV contrast.  COMPARISON:  06/09/2010 FINDINGS: Lower chest: Heart size upper normal. Status post median sternotomy. Coronary artery calcification is evident. Small to moderate right pleural effusion with tiny left pleural effusion, incompletely visualized. 7 mm posterior left lower lobe pulmonary nodule evident. Hepatobiliary: Subtle nodularity of liver contour suggests cirrhosis. Gallbladder is distended with 17 mm calcified gallstone evident. No intrahepatic or extrahepatic biliary dilation. Pancreas: Pancreas is diffusely atrophic. Spleen: No splenomegaly. No focal mass lesion. Adrenals/Urinary Tract: Left adrenal thickening without a discrete nodule or mass evident. Right adrenal gland unremarkable. Native kidneys are atrophic bilaterally. Bladder is decompressed. Stomach/Bowel: Stomach is decompressed. Duodenum is normally positioned as is the ligament of Treitz. No small bowel wall thickening. No small bowel dilatation. The terminal ileum is normal. The appendix is normal. The diverticular changes are noted in the left colon and there is mild associated relatively diffuse wall thickening in the sigmoid colon and rectum. Vascular/Lymphatic: There is abdominal aortic atherosclerosis without aneurysm. There is no gastrohepatic or hepatoduodenal ligament lymphadenopathy. No intraperitoneal or retroperitoneal lymphadenopathy. No pelvic sidewall lymphadenopathy. Reproductive: Uterus surgically absent.  There is no adnexal mass. Other: No intraperitoneal free fluid. Diffuse body wall edema  evident. Musculoskeletal: Nondisplaced fracture identified in the right inferior pubic ramus (image 75/series 2) without evidence of bony callus. Another for nondisplaced fracture is identified in the posterior right superior pubic ramus near the acetabulum, best visualized on coronal image 25/series 4. bones are diffusely demineralized. Lumbar scoliosis evident. IMPRESSION: 1. Diverticular changes with mild circumferential wall thickening in the sigmoid colon and rectum. No gross rectal mass evident. 2. Nondisplaced acute fractures of the right inferior pubic ramus and posterior aspect of the right superior pubic ramus. 3. Subtle nodularity of liver contour suggests cirrhosis. 4.  Aortic Atherosclerois (ICD10-170.0) 5. Diffuse body wall edema. 6. Cholelithiasis. Electronically Signed   By: Misty Stanley M.D.   On: 05/05/2017 15:08   IMPRESSION AND PLAN:   *Bright red blood per rectum secondary to sigmoid diverticulitis.  Will start IV ciprofloxacin and Flagyl.  Transfuse 1 unit packed RBCs stat.  Monitor hemoglobin closely.  Should improve with diverticulitis improving.  If no improvement will need GI and vascular consultation. Hold aspirin and Plavix.  Serial hemoglobin. Liquid diet for now.  *End-stage renal disease on hemodialysis. Due for dialysis again on Wednesday.  *Hypertension.  Hold medications at this time.  Restart medications if blood pressure trends up.  *Diabetes mellitus.  Sliding scale insulin.  DVT prophylaxis with SCDs  All the records are reviewed and case discussed with ED provider. Management plans discussed with the patient, family and they are in agreement.  CODE STATUS: DNR  TOTAL CC TIME TAKING CARE OF THIS PATIENT: 40 minutes.   Leia Alf Mateo Overbeck M.D on 05/05/2017 at 3:48 PM  Between 7am to 6pm - Pager - [phone]  After 6pm go to [email or web address] - password [email or web address] [facility]  Office  [phone]  CC: Primary care physician; [name], MD  Note: This dictation was prepared with Dragon dictation along with smaller phrase technology. Any transcriptional errors that result from this process are unintentional.

## 2017-05-05 NOTE — ED Notes (Signed)
Blood consent signed

## 2017-05-05 NOTE — Progress Notes (Signed)
Notified patient's primary nurse for low bed and mats. Order placed for MRSA swab.

## 2017-05-05 NOTE — ED Notes (Signed)
Patient transported to CT 

## 2017-05-05 NOTE — ED Notes (Signed)
Dialysis catheter still intact, dialysis RN called to d/c catheter per MD

## 2017-05-05 NOTE — ED Notes (Signed)
Dialysis catheter removed by dialysis RNs

## 2017-05-05 NOTE — Progress Notes (Signed)
[facility]  ROUNDING NOTE   Subjective:    Patient known to our practice from previous admissions She was sent over from the dialysis center for rectal bleeding which was witnessed Her hemoglobin had been running low at 7.8 as per dialysis center Needles from the arm were removed by the dialysis staff in the emergency room Outpatient dialysis center also reports that patient had excessive bleeding from her access at times At present, she is comfortable on 2 L nasal cannula She is getting IV blood transfusion  Denies any acute shortness of breath   Vital signs in last 24 hours:  Temp:  [97.6 F (36.4 C)-97.8 F (36.6 C)] 97.6 F (36.4 C) (05/01 1516) Pulse Rate:  [58-69] 67 (05/01 1516) Resp:  [14-20] 16 (05/01 1516) BP: (114-133)/(35-56) 122/38 (05/01 1516) SpO2:  [98 %-100 %] 98 % (05/01 1516) Weight:  [106 lb 3.2 oz (48.2 kg)] 106 lb 3.2 oz (48.2 kg) (05/01 1308)  Weight change:  Filed Weights   05/05/17 1308  Weight: 106 lb 3.2 oz (48.2 kg)    Intake/Output: No intake/output data recorded.   Intake/Output this shift:  Total I/O In: 310 [Blood:310] Out: -   Physical Exam: General: NAD,   Head: Normocephalic, atraumatic.  Dry oral mucosal membranes  Eyes: Anicteric,  Neck: Supple, trachea midline  Lungs:  Clear to auscultation  Heart: Regular rate and rhythm  Abdomen:  Soft, nontender,   Extremities:  no peripheral edema.  Neurologic: alert to self only,  able to answer  Few questions  Skin: Scattered eccymosis  Access:  Left upper extremity AVF    Basic Metabolic Panel: Recent Labs  Lab 05/05/17 1312  NA 134*  K 3.8  CL 94*  CO2 29  GLUCOSE 123*  BUN 26*  CREATININE 3.08*  CALCIUM 7.8*    Liver Function Tests: Recent Labs  Lab 05/05/17 1312  AST 25  ALT 8*  ALKPHOS 141*  BILITOT 1.2  PROT 7.0  ALBUMIN 2.8*   No results for input(s): LIPASE, AMYLASE in the last 168 hours. No results for input(s): AMMONIA in the last 168  hours.  CBC: Recent Labs  Lab 05/05/17 1312  WBC 22.5*  HGB 7.8*  HCT 23.6*  MCV 107.1*  PLT 189    Cardiac Enzymes: No results for input(s): CKTOTAL, CKMB, CKMBINDEX, TROPONINI in the last 168 hours.  BNP: Invalid input(s): POCBNP  CBG: No results for input(s): GLUCAP in the last 168 hours.  Microbiology: Results for orders placed or performed during the hospital encounter of 01/03/17  Blood Culture (routine x 2)     Status: None   Collection Time: 01/03/17  8:36 AM  Result Value Ref Range Status   Specimen Description BLOOD BLOOD RIGHT HAND  Final   Special Requests   Final    BOTTLES DRAWN AEROBIC AND ANAEROBIC Blood Culture results may not be optimal due to an inadequate volume of blood received in culture bottles   Culture   Final    NO GROWTH 5 DAYS Performed at [facility], [address] [place], [place] [address]    Report Status 01/08/2017 FINAL  Final  Blood Culture (routine x 2)     Status: None   Collection Time: 01/03/17  8:36 AM  Result Value Ref Range Status   Specimen Description BLOOD BLOOD RIGHT ARM  Final   Special Requests   Final    BOTTLES DRAWN AEROBIC AND ANAEROBIC Blood Culture adequate volume   Culture  Final    NO GROWTH 5 DAYS Performed at [facility], [address] [place], [place] [address]    Report Status 01/08/2017 FINAL  Final  Urine culture     Status: Abnormal   Collection Time: 01/03/17  8:36 AM  Result Value Ref Range Status   Specimen Description   Final    URINE, RANDOM Performed at [facility], [address] [place], [place] [address]    Special Requests   Final    NONE Performed at [facility], [address] [place], [place] [address]    Culture >=100,000 COLONIES/mL ESCHERICHIA COLI (A)  Final   Report Status 01/05/2017 FINAL  Final   Organism ID, Bacteria ESCHERICHIA COLI (A)  Final      Susceptibility   Escherichia coli - MIC*    AMPICILLIN >=32 RESISTANT  Resistant     CEFAZOLIN 16 SENSITIVE Sensitive     CEFTRIAXONE <=1 SENSITIVE Sensitive     CIPROFLOXACIN >=4 RESISTANT Resistant     GENTAMICIN >=16 RESISTANT Resistant     IMIPENEM <=0.25 SENSITIVE Sensitive     NITROFURANTOIN 64 INTERMEDIATE Intermediate     TRIMETH/SULFA <=20 SENSITIVE Sensitive     AMPICILLIN/SULBACTAM >=32 RESISTANT Resistant     PIP/TAZO <=4 SENSITIVE Sensitive     Extended ESBL NEGATIVE Sensitive     * >=100,000 COLONIES/mL ESCHERICHIA COLI  MRSA PCR Screening     Status: None   Collection Time: 01/03/17  2:30 PM  Result Value Ref Range Status   MRSA by PCR NEGATIVE NEGATIVE Final    Comment:        The GeneXpert MRSA Assay (FDA approved for NASAL specimens only), is one component of a comprehensive MRSA colonization surveillance program. It is not intended to diagnose MRSA infection nor to guide or monitor treatment for MRSA infections. Performed at [facility], [address] [place], [place] [address]     Coagulation Studies: Recent Labs    05/05/17 1312  LABPROT 15.8*  INR 1.27    Urinalysis: No results for input(s): COLORURINE, LABSPEC, PHURINE, GLUCOSEU, HGBUR, BILIRUBINUR, KETONESUR, PROTEINUR, UROBILINOGEN, NITRITE, LEUKOCYTESUR in the last 72 hours.  Invalid input(s): APPERANCEUR    Imaging: Ct Abdomen Pelvis Wo Contrast  Result Date: 05/05/2017 CLINICAL DATA:  Hypotension. EXAM: CT ABDOMEN AND PELVIS WITHOUT CONTRAST TECHNIQUE: Multidetector CT imaging of the abdomen and pelvis was performed following the standard protocol without IV contrast. COMPARISON:  06/09/2010 FINDINGS: Lower chest: Heart size upper normal. Status post median sternotomy. Coronary artery calcification is evident. Small to moderate right pleural effusion with tiny left pleural effusion, incompletely visualized. 7 mm posterior left lower lobe pulmonary nodule evident. Hepatobiliary: Subtle nodularity of liver contour suggests cirrhosis. Gallbladder is  distended with 17 mm calcified gallstone evident. No intrahepatic or extrahepatic biliary dilation. Pancreas: Pancreas is diffusely atrophic. Spleen: No splenomegaly. No focal mass lesion. Adrenals/Urinary Tract: Left adrenal thickening without a discrete nodule or mass evident. Right adrenal gland unremarkable. Native kidneys are atrophic bilaterally. Bladder is decompressed. Stomach/Bowel: Stomach is decompressed. Duodenum is normally positioned as is the ligament of Treitz. No small bowel wall thickening. No small bowel dilatation. The terminal ileum is normal. The appendix is normal. The diverticular changes are noted in the left colon and there is mild associated relatively diffuse wall thickening in the sigmoid colon and rectum. Vascular/Lymphatic: There is abdominal aortic atherosclerosis without aneurysm. There is no gastrohepatic or hepatoduodenal ligament lymphadenopathy. No intraperitoneal or retroperitoneal lymphadenopathy. No pelvic sidewall  lymphadenopathy. Reproductive: Uterus surgically absent.  There is no adnexal mass. Other: No intraperitoneal free fluid. Diffuse body wall edema evident. Musculoskeletal: Nondisplaced fracture identified in the right inferior pubic ramus (image 75/series 2) without evidence of bony callus. Another for nondisplaced fracture is identified in the posterior right superior pubic ramus near the acetabulum, best visualized on coronal image 25/series 4. bones are diffusely demineralized. Lumbar scoliosis evident. IMPRESSION: 1. Diverticular changes with mild circumferential wall thickening in the sigmoid colon and rectum. No gross rectal mass evident. 2. Nondisplaced acute fractures of the right inferior pubic ramus and posterior aspect of the right superior pubic ramus. 3. Subtle nodularity of liver contour suggests cirrhosis. 4.  Aortic Atherosclerois (ICD10-170.0) 5. Diffuse body wall edema. 6. Cholelithiasis. Electronically Signed   By: Misty Stanley M.D.   On:  05/05/2017 15:08     Medications:   . piperacillin-tazobactam        Assessment/ Plan:  [name] is a 84 y.o. white female with end stage renal disease on hemodialysis, coronary artery disease, history of bilateral knee replacement, aortic valve replacement 2009, diabetes, hypertension, hypothyroidism, peripheral vascular disease, pulmonary hypertension, anxiety  [facility] [address]/MWF  1.  ESRD 2.  Anemia of CKD and GI bleed  Patient received 1 hour of dialysis as outpatient At present, she feels comfortable. Denies SOB. No s/s of fluid overload Her potassium is 3.8. We will reevaluate for dialysis in the morning   LOS: 0 Layn Kye 5/1/20193:34 PM

## 2017-05-05 NOTE — ED Notes (Signed)
Dialysis RN at bedside to d/c catheter,  Pt still at CT

## 2017-05-05 NOTE — Progress Notes (Signed)
Dialysis RN called to ED to remove needles. Pt left dialysis out-pt center r/t hypotension and rectal bleeding. MD aware

## 2017-05-05 NOTE — ED Notes (Signed)
D

## 2017-05-05 NOTE — ED Provider Notes (Signed)
[facility] Emergency Department Provider Note  ____________________________________________  Time seen: Approximately 4:40 PM  I have reviewed the triage vital signs and the nursing notes.   HISTORY  Chief Complaint Hypotension and Rectal Bleeding  Level 5 caveat:  Portions of the history and physical were unable to be obtained due to dementia and hard of hearing   HPI [name] is a 84 y.o. female history of ESRD on HD, CHF, diabetes, hypertension who presents from dialysis for rectal bleeding and hypotension.  Patient was one hour into her dialysis treatment when she started feeling dizzy.  They stopped dialysis after noticing the patient's systolic blood pressure was 86.  She then had to have a bowel movement.  They took her to the bathroom where she had a large bloody stool.  Patient is on Plavix.  Patient has a history of dementia and is very hard of hearing which limits history.  She denies abdominal pain.  She is not sure if she is ever had bloody stools in the past.  She denies feeling dizzy at this time.  She denies shortness of breath or chest pain.   Past Medical History:  Diagnosis Date  . Anemia    NOS  . Anxiety   . Aortic stenosis   . Cancer [facility])    Mole on top of head to be removed in July 2013  . Chronic diastolic CHF (congestive heart failure) ([place])    a. 01/2017 Echo: EF 60-65%, no rwma, Gr2 DD, mild to mod MR, mildly dil LA/RA, nl RV fxn, Sev TR, PASP 48mmHg.  . Constipation   . Coronary artery disease    a. 2009 CABG x 3 w/ bioprosthetic AVR.  . Diabetes mellitus    type II;was on Glipizide but has been off x 66mon  . Dialysis patient ([place])   . Diarrhea   . ESRD (end stage renal disease) ([place])   . GERD (gastroesophageal reflux disease)   . History of blood transfusion   . Hyperlipidemia    takes Simvastatin nightly  . Hypertension    takes Metoprolol daily  . Hypothyroidism    takes Synthroid daily  . Migraine    hx of    . Oligouria   . Osteoarthritis   . Osteopenia   . Peripheral vascular disease ([place])   . Personal history of colonic polyps   . Pulmonary hypertension ([employer])   . Urinary incontinence   . UTI (urinary tract infection)     Patient Active Problem List   Diagnosis Date Noted  . GI bleed 05/05/2017  . Pelvic fracture ([place]) 04/26/2017  . Protein-calorie malnutrition, severe 01/07/2017  . CHF (congestive heart failure) ([place]) 01/03/2017  . UTI (urinary tract infection) 01/03/2017  . Acute metabolic encephalopathy 32/67/1245  . Cerebrovascular accident (CVA) due to occlusion of right middle cerebral artery ([place]) 10/24/2015  . Malnutrition of mild degree ([place]) 10/24/2015  . Altered mental status 09/18/2015  . Preventative health care 05/03/2014  . Peripheral vascular disease ([place]) 12/21/2013  . Toe amputation status ([place]) 08/29/2013  . History of recent fall 04/18/2013  . HTN (hypertension), malignant 03/22/2013  . S/P aortic valve replacement with bioprosthetic valve 11/15/2012  . S/P CABG x 3 11/15/2012  . Anemia of chronic kidney failure 12/22/2010  . Thrombocytopenia ([place]) 12/13/2010  . End stage renal disease on dialysis ([place]) 12/13/2010  . S/P aortic valve replacement 08/11/2010  . Arterial insufficiency-lower 07/25/2010  . NEURODERMATITIS 04/08/2009  . Carotid arterial disease (  [place]) 01/01/2009  . Coronary atherosclerosis 02/11/2007  . AORTIC STENOSIS 10/04/2006  . ANXIETY 05/30/2006  . Pulmonary hypertension ([place]) 05/30/2006  . Hypothyroidism 05/27/2006  . Type 2 diabetes mellitus with renal manifestations, controlled ([place]) 05/27/2006  . Hyperlipidemia 05/27/2006  . Essential hypertension 05/27/2006  . GERD 05/27/2006  . OSTEOARTHRITIS 05/27/2006  . OSTEOPENIA 05/27/2006  . URINARY INCONTINENCE 05/27/2006    Past Surgical History:  Procedure Laterality Date  . A/V FISTULAGRAM [name] 01/07/2017   Procedure: A/V Fistulagram;  Surgeon: Algernon Huxley, MD;  Location: [place] CV [facility];  Service: Cardiovascular;  Laterality: [name];  . A/V SHUNT INTERVENTION [name] 01/07/2017   Procedure: A/V SHUNT INTERVENTION;  Surgeon: Algernon Huxley, MD;  Location: [place] [facility];  Service: Cardiovascular;  Laterality: [name];  . ABDOMINAL HYSTERECTOMY    . adenosine myoview  2007   benign, EF 69%  . AMPUTATION  06/18/2011   Procedure: AMPUTATION DIGIT;  Surgeon: Newt Minion, MD;  Location: [place];  Service: Orthopedics;  Laterality: Left;  Left 2nd Toe Amputation at MTP Joint  . Amputation-left great toe  7/12   Dr Marlou Sa  . AORTIC VALVE REPLACEMENT  2009  . APPENDECTOMY    . AV FISTULA PLACEMENT  12/22/2010   Procedure: ARTERIOVENOUS (AV) FISTULA CREATION;  Surgeon: Hinda Lenis, MD;  Location: [place];  Service: Vascular;  Laterality: Left;  Creation of Left Brachial-Cephalic Fistula  . CARDIAC CATHETERIZATION  2000   cad  . CAROTID ENDARTERECTOMY  2011   Right  . CATARACT EXTRACTION    . CORONARY ARTERY BYPASS GRAFT     od  . CORONARY ARTERY BYPASS GRAFT  2009  . EYE SURGERY     BIlateral  . INSERTION OF DIALYSIS CATHETER  12/18/2010   Procedure: INSERTION OF DIALYSIS CATHETER;  Surgeon: Mal Misty, MD;  Location: [place];  Service: Vascular;  Laterality: Right;  . REPLACEMENT TOTAL KNEE BILATERAL  05/1998  . TONSILLECTOMY    . Traumatic Amputation of Right DIP joint of Index Finger      Prior to Admission medications   Medication Sig Start Date End Date Taking? Authorizing Provider  amLODipine (NORVASC) 5 MG tablet TAKE ONE TABLET BY MOUTH ONCE DAILY 02/16/17  Yes Viviana Simpler I, MD  aspirin EC 81 MG tablet Take 81 mg by mouth at bedtime.   Yes [provider]  calcium carbonate (TUMS - DOSED IN MG ELEMENTAL CALCIUM) 500 MG chewable tablet Chew 1 tablet (200 mg of elemental calcium total) by mouth 3 (three) times daily with meals. 12/06/12  Yes Vann, Jessica U, DO  cloNIDine (CATAPRES) 0.1 MG tablet TAKE 1 TABLET BY MOUTH TWICE DAILY 02/16/17  Yes  [name], MD  clopidogrel (PLAVIX) 75 MG tablet Take 1 tablet (75 mg total) by mouth daily. 02/23/17  Yes [name], MD  folic acid (FOLVITE) 0.5 MG tablet Take 0.5 tablets (0.5 mg total) by mouth daily. 12/06/12  Yes Vann, Jessica U, DO  hydrALAZINE (APRESOLINE) 25 MG tablet TAKE ONE TABLET BY MOUTH EVERY 8 HOURS. 04/01/16  Yes [name], MD  levothyroxine (SYNTHROID, LEVOTHROID) 75 MCG tablet TAKE ONE TABLET BY MOUTH ONCE DAILY 07/07/16  Yes [name], MD  metoprolol tartrate (LOPRESSOR) 50 MG tablet TAKE ONE TABLET BY MOUTH TWICE DAILY 02/05/17  Yes Gollan, Kathlene November, MD  multivitamin (RENA-VIT) TABS tablet Take 1 tablet by mouth at bedtime. 01/08/17  Yes Fritzi Mandes, MD  pravastatin (PRAVACHOL) 40  MG tablet TAKE ONE TABLET BY MOUTH IN THE EVENING 07/21/16  Yes Gollan, Kathlene November, MD  vitamin C (VITAMIN C) 500 MG tablet Take 1 tablet (500 mg total) by mouth daily. 12/06/12  Yes Geradine Girt, DO  acetaminophen (TYLENOL) 325 MG tablet Take 1 tablet (325 mg total) by mouth every 6 (six) hours as needed for mild pain or fever (or Fever >/= 101). 04/28/17   Gouru, Aruna, MD  albuterol (PROVENTIL) (2.5 MG/3ML) 0.083% nebulizer solution Take 3 mLs (2.5 mg total) by nebulization every 2 (two) hours as needed for wheezing. 04/28/17   Nicholes Mango, MD  feeding supplement, ENSURE ENLIVE, (ENSURE ENLIVE) LIQD Take 237 mLs by mouth 3 (three) times daily between meals. 01/08/17   Fritzi Mandes, MD  Ganciclovir (ZIRGAN) 0.15 % GEL Place 1 drop into the right eye 4 (four) times daily. 01/08/17   Fritzi Mandes, MD  HYDROcodone-acetaminophen (NORCO/VICODIN) 5-325 MG tablet Take 1-2 tablets by mouth every 4 (four) hours as needed for moderate pain. 04/28/17   Gouru, Illene Silver, MD  ondansetron (ZOFRAN) 4 MG tablet Take 1 tablet (4 mg total) by mouth every 6 (six) hours as needed for nausea. 04/28/17   Nicholes Mango, MD  polyvinyl alcohol (LIQUIFILM TEARS) 1.4 % ophthalmic solution Place 1 drop into both eyes as  needed for dry eyes. 09/22/15   Fritzi Mandes, MD  senna-docusate (SENOKOT-S) 8.6-50 MG tablet Take 1 tablet by mouth at bedtime as needed for mild constipation. 04/28/17   Nicholes Mango, MD    Allergies Nitrofurantoin; Captopril; Enalapril maleate; Ramipril; Sulfa antibiotics; and Verapamil  Family History  Family history unknown: Yes    Social History Social History   Tobacco Use  . Smoking status: Former Smoker    Years: 15.00    Types: Cigarettes    Last attempt to quit: 01/06/1943    Years since quitting: 74.3  . Smokeless tobacco: Never Used  Substance Use Topics  . Alcohol use: Yes    Comment: Wine occasionally  . Drug use: No    Review of Systems  Constitutional: Negative for fever. + dizziness Eyes: Negative for visual changes. ENT: Negative for sore throat. Neck: No neck pain  Cardiovascular: Negative for chest pain. Respiratory: Negative for shortness of breath. Gastrointestinal: Negative for abdominal pain, vomiting or diarrhea. + bloody bowel movement Genitourinary: Negative for dysuria. Musculoskeletal: Negative for back pain. Skin: Negative for rash. Neurological: Negative for headaches, weakness or numbness. Psych: No SI or HI  ____________________________________________   PHYSICAL EXAM:  VITAL SIGNS: ED Triage Vitals [05/05/17 1308]  Enc Vitals Group     BP (!) 132/50     Pulse Rate 62     Resp 20     Temp 97.8 F (36.6 C)     Temp Source Oral     SpO2 100 %     Weight 106 lb 3.2 oz (48.2 kg)     Height 5\' 3"  (1.6 m)     Head Circumference      Peak Flow      Pain Score 0     Pain Loc      Pain Edu?      Excl. in [place]?     Constitutional: Alert and oriented x 1. Well appearing and in no apparent distress. HEENT:      Head: Normocephalic and atraumatic.         Eyes: Conjunctivae are normal. Sclera is non-icteric.       Mouth/Throat: Mucous membranes are moist.  Neck: Supple with no signs of meningismus. Cardiovascular: Regular  rate and rhythm. No murmurs, gallops, or rubs. 2+ symmetrical distal pulses are present in all extremities. No JVD. Respiratory: Normal respiratory effort. Lungs are clear to auscultation bilaterally. No wheezes, crackles, or rhonchi.  Gastrointestinal: Soft, non tender, and non distended with positive bowel sounds. No rebound or guarding. Genitourinary: No CVA tenderness.  Rectal exam showing gross blood Musculoskeletal: Nontender with normal range of motion in all extremities. No edema, cyanosis, or erythema of extremities. Neurologic: Normal speech and language. Face is symmetric. Moving all extremities. No gross focal neurologic deficits are appreciated. Skin: Skin is warm, dry and intact. No rash noted. Psychiatric: Mood and affect are normal. Speech and behavior are normal.  ____________________________________________   LABS (all labs ordered are listed, but only abnormal results are displayed)  Labs Reviewed  COMPREHENSIVE METABOLIC PANEL - Abnormal; Notable for the following components:      Result Value   Sodium 134 (*)    Chloride 94 (*)    Glucose, Bld 123 (*)    BUN 26 (*)    Creatinine, Ser 3.08 (*)    Calcium 7.8 (*)    Albumin 2.8 (*)    ALT 8 (*)    Alkaline Phosphatase 141 (*)    GFR calc non Af Amer 12 (*)    GFR calc Af Amer 14 (*)    All other components within normal limits  CBC - Abnormal; Notable for the following components:   WBC 22.5 (*)    RBC 2.21 (*)    Hemoglobin 7.8 (*)    HCT 23.6 (*)    MCV 107.1 (*)    MCH 35.4 (*)    RDW 17.6 (*)    All other components within normal limits  PROTIME-INR - Abnormal; Notable for the following components:   Prothrombin Time 15.8 (*)    All other components within normal limits  LACTIC ACID, PLASMA - Abnormal; Notable for the following components:   Lactic Acid, Venous 2.1 (*)    All other components within normal limits  APTT  HEMOGLOBIN A1C  POC OCCULT BLOOD, ED  TYPE AND SCREEN  PREPARE RBC  (CROSSMATCH)  ABO/RH   ____________________________________________  EKG  ED ECG REPORT I, Rudene Re, the attending physician, personally viewed and interpreted this ECG.  Normal sinus rhythm, rate of 61, first-degree AV block, prolonged QTC, right axis deviation, no ST elevations or depressions.  Unchanged from prior. ____________________________________________  RADIOLOGY  I have personally reviewed the images performed during this visit and I agree with the Radiologist's read.   Interpretation by Radiologist:  Ct Abdomen Pelvis Wo Contrast  Result Date: 05/05/2017 CLINICAL DATA:  Hypotension. EXAM: CT ABDOMEN AND PELVIS WITHOUT CONTRAST TECHNIQUE: Multidetector CT imaging of the abdomen and pelvis was performed following the standard protocol without IV contrast. COMPARISON:  06/09/2010 FINDINGS: Lower chest: Heart size upper normal. Status post median sternotomy. Coronary artery calcification is evident. Small to moderate right pleural effusion with tiny left pleural effusion, incompletely visualized. 7 mm posterior left lower lobe pulmonary nodule evident. Hepatobiliary: Subtle nodularity of liver contour suggests cirrhosis. Gallbladder is distended with 17 mm calcified gallstone evident. No intrahepatic or extrahepatic biliary dilation. Pancreas: Pancreas is diffusely atrophic. Spleen: No splenomegaly. No focal mass lesion. Adrenals/Urinary Tract: Left adrenal thickening without a discrete nodule or mass evident. Right adrenal gland unremarkable. Native kidneys are atrophic bilaterally. Bladder is decompressed. Stomach/Bowel: Stomach is decompressed. Duodenum is normally positioned as is the ligament  of [place]. No small bowel wall thickening. No small bowel dilatation. The terminal ileum is normal. The appendix is normal. The diverticular changes are noted in the left colon and there is mild associated relatively diffuse wall thickening in the sigmoid colon and rectum.  Vascular/Lymphatic: There is abdominal aortic atherosclerosis without aneurysm. There is no gastrohepatic or hepatoduodenal ligament lymphadenopathy. No intraperitoneal or retroperitoneal lymphadenopathy. No pelvic sidewall lymphadenopathy. Reproductive: Uterus surgically absent.  There is no adnexal mass. Other: No intraperitoneal free fluid. Diffuse body wall edema evident. Musculoskeletal: Nondisplaced fracture identified in the right inferior pubic ramus (image 75/series 2) without evidence of bony callus. Another for nondisplaced fracture is identified in the posterior right superior pubic ramus near the acetabulum, best visualized on coronal image 25/series 4. bones are diffusely demineralized. Lumbar scoliosis evident. IMPRESSION: 1. Diverticular changes with mild circumferential wall thickening in the sigmoid colon and rectum. No gross rectal mass evident. 2. Nondisplaced acute fractures of the right inferior pubic ramus and posterior aspect of the right superior pubic ramus. 3. Subtle nodularity of liver contour suggests cirrhosis. 4.  Aortic Atherosclerois (ICD10-170.0) 5. Diffuse body wall edema. 6. Cholelithiasis. Electronically Signed   By: Misty Stanley M.D.   On: 05/05/2017 15:08     ____________________________________________   PROCEDURES  Procedure(s) performed: None Procedures Critical Care performed:  None ____________________________________________   INITIAL IMPRESSION / ASSESSMENT AND PLAN / ED COURSE   84 y.o. female history of ESRD on HD, CHF, diabetes, hypertension who presents from dialysis for rectal bleeding and hypotension.  Patient with active rectal bleeding, she is hemodynamically stable, she is on Plavix, abdomen soft with no tenderness however due to significant leukocytosis a CT abdomen pelvis was ordered which showed diverticulitis.  Patient was given Zosyn.  She was transfused for hemoglobin of 7.8 (baseline is 10).  Patient with no hyperkalemia, no anion gap,  and normal bicarb with no indication for emergent dialysis.  She was admitted to the hospitalist service.      As part of my medical decision making, I reviewed the following data within the [place] notes reviewed and incorporated, Labs reviewed , EKG interpreted , Old EKG reviewed, Old chart reviewed, Radiograph reviewed , Discussed with admitting physician , Notes from prior ED visits and [place] Controlled Substance Database    Pertinent labs & imaging results that were available during my care of the patient were reviewed by me and considered in my medical decision making (see chart for details).    ____________________________________________   FINAL CLINICAL IMPRESSION(S) / ED DIAGNOSES  Final diagnoses:  Lower GI bleed  Diverticulitis  Multiple closed fractures of pelvis without disruption of pelvic ring, initial encounter ([place])      NEW MEDICATIONS STARTED DURING THIS VISIT:  ED Discharge Orders    None       Note:  This document was prepared using Dragon voice recognition software and may include unintentional dictation errors.    Alfred Levins, [place], MD 05/05/17 [id number]

## 2017-05-05 NOTE — ED Triage Notes (Signed)
Pt arrived via ems from dialysis. Pt had received 1 hour of dialysis when she began to feel weak and a blood pressure reading of 86 was discovered. Dialysis was stopped.Pt was taken to the bathroom and bright red blood was reported. Upon arrival to ED pt presents to be weak but is a & o x4 and denies any pain. Blood pressure reading of 132/50.

## 2017-05-06 LAB — GLUCOSE, CAPILLARY
GLUCOSE-CAPILLARY: 87 mg/dL (ref 65–99)
GLUCOSE-CAPILLARY: 168 mg/dL — AB (ref 65–99)
Glucose-Capillary: 106 mg/dL — ABNORMAL HIGH (ref 65–99)
Glucose-Capillary: 109 mg/dL — ABNORMAL HIGH (ref 65–99)
Glucose-Capillary: 109 mg/dL — ABNORMAL HIGH (ref 65–99)
Glucose-Capillary: 154 mg/dL — ABNORMAL HIGH (ref 65–99)

## 2017-05-06 LAB — CBC
HEMATOCRIT: 27.5 % — AB (ref 35.0–47.0)
Hemoglobin: 9.1 g/dL — ABNORMAL LOW (ref 12.0–16.0)
MCH: 34.1 pg — ABNORMAL HIGH (ref 26.0–34.0)
MCHC: 33.3 g/dL (ref 32.0–36.0)
MCV: 102.5 fL — ABNORMAL HIGH (ref 80.0–100.0)
Platelets: 187 10*3/uL (ref 150–440)
RBC: 2.68 MIL/uL — ABNORMAL LOW (ref 3.80–5.20)
RDW: 20.8 % — AB (ref 11.5–14.5)
WBC: 15.5 10*3/uL — ABNORMAL HIGH (ref 3.6–11.0)

## 2017-05-06 LAB — BASIC METABOLIC PANEL
Anion gap: 9 (ref 5–15)
BUN: 38 mg/dL — AB (ref 6–20)
CALCIUM: 7.9 mg/dL — AB (ref 8.9–10.3)
CO2: 30 mmol/L (ref 22–32)
CREATININE: 3.89 mg/dL — AB (ref 0.44–1.00)
Chloride: 93 mmol/L — ABNORMAL LOW (ref 101–111)
GFR calc Af Amer: 10 mL/min — ABNORMAL LOW (ref >60)
GFR, EST NON AFRICAN AMERICAN: 9 mL/min — AB (ref >60)
GLUCOSE: 105 mg/dL — AB (ref 65–99)
POTASSIUM: 4.4 mmol/L (ref 3.5–5.1)
Sodium: 132 mmol/L — ABNORMAL LOW (ref 135–145)

## 2017-05-06 LAB — MRSA PCR SCREENING: MRSA by PCR: NEGATIVE

## 2017-05-06 MED ORDER — RENA-VITE PO TABS
1.0000 | ORAL_TABLET | Freq: Every day | ORAL | Status: DC
  Administered 2017-05-06: 1 via ORAL
  Filled 2017-05-06: qty 1

## 2017-05-06 MED ORDER — VITAMIN C 500 MG PO TABS
250.0000 mg | ORAL_TABLET | Freq: Two times a day (BID) | ORAL | Status: DC
  Administered 2017-05-06: 250 mg via ORAL
  Filled 2017-05-06 (×4): qty 0.5

## 2017-05-06 MED ORDER — AMOXICILLIN-POT CLAVULANATE 500-125 MG PO TABS
1.0000 | ORAL_TABLET | ORAL | Status: DC
  Administered 2017-05-06: 500 mg via ORAL
  Filled 2017-05-06 (×2): qty 1

## 2017-05-06 MED ORDER — BOOST / RESOURCE BREEZE PO LIQD CUSTOM
1.0000 | Freq: Three times a day (TID) | ORAL | Status: DC
  Administered 2017-05-06 – 2017-05-07 (×3): 1 via ORAL

## 2017-05-06 NOTE — Progress Notes (Signed)
[facility] [place] NAME: [name]    MR#:  [id number]  DATE OF BIRTH:  June 19, 1920  SUBJECTIVE:   Patient came in after having bright red blood per rectum dialysis. She is very hard on hearing. Family in the room tolerating diet. No active bleeding per RN REVIEW OF SYSTEMS:   Review of Systems  Constitutional: Negative for chills, fever and weight loss.  HENT: Negative for ear discharge, ear pain and nosebleeds.   Eyes: Negative for blurred vision, pain and discharge.  Respiratory: Negative for sputum production, shortness of breath, wheezing and stridor.   Cardiovascular: Negative for chest pain, palpitations, orthopnea and PND.  Gastrointestinal: Positive for blood in stool. Negative for abdominal pain, diarrhea, nausea and vomiting.  Genitourinary: Negative for frequency and urgency.  Musculoskeletal: Negative for back pain and joint pain.  Neurological: Negative for sensory change, speech change, focal weakness and weakness.  Psychiatric/Behavioral: Negative for depression and hallucinations. The patient is not nervous/anxious.    Tolerating Diet:yes Tolerating PT: [facility]  DRUG ALLERGIES:   Allergies  Allergen Reactions  . Nitrofurantoin Itching  . Captopril Other (See Comments)    REACTION: unspecified  . Enalapril Maleate Cough  . Ramipril Other (See Comments)    REACTION: unspecified  . Sulfa Antibiotics Other (See Comments)    Reaction unknown  . Verapamil Other (See Comments)    REACTION: unspecified    VITALS:  Blood pressure (!) 115/51, pulse 67, temperature 98 F (36.7 C), temperature source Oral, resp. rate 19, height 5\' 3"  (1.6 m), weight 48.2 kg (106 lb 3.2 oz), SpO2 99 %.  PHYSICAL EXAMINATION:   Physical Exam  GENERAL:  84 y.o.-year-old patient lying in the bed with no acute distress. thin EYES: Pupils equal, round, reactive to light and accommodation. No scleral icterus.  Extraocular muscles intact. Pallor+ HEENT: Head atraumatic, normocephalic. Oropharynx and nasopharynx clear.  NECK:  Supple, no jugular venous distention. No thyroid enlargement, no tenderness.  LUNGS: Normal breath sounds bilaterally, no wheezing, rales, rhonchi. No use of accessory muscles of respiration.  CARDIOVASCULAR: S1, S2 normal. No murmurs, rubs, or gallops.  ABDOMEN: Soft, mild left side tender, nondistended. Bowel sounds present. No organomegaly or mass.  EXTREMITIES: No cyanosis, clubbing or edema b/l.    NEUROLOGIC: Cranial nerves II through XII are intact. No focal Motor or sensory deficits b/l.   PSYCHIATRIC:  patient is alert and awake, hard on hearing SKIN: No obvious rash, lesion, or ulcer.   LABORATORY PANEL:  CBC Recent Labs  Lab 05/06/17 0426  WBC 15.5*  HGB 9.1*  HCT 27.5*  PLT 187    Chemistries  Recent Labs  Lab 05/05/17 1312 05/06/17 0426  NA 134* 132*  K 3.8 4.4  CL 94* 93*  CO2 29 30  GLUCOSE 123* 105*  BUN 26* 38*  CREATININE 3.08* 3.89*  CALCIUM 7.8* 7.9*  AST 25  --   ALT 8*  --   ALKPHOS 141*  --   BILITOT 1.2  --    Cardiac Enzymes No results for input(s): TROPONINI in the last 168 hours. RADIOLOGY:  Ct Abdomen Pelvis Wo Contrast  Result Date: 05/05/2017 CLINICAL DATA:  Hypotension. EXAM: CT ABDOMEN AND PELVIS WITHOUT CONTRAST TECHNIQUE: Multidetector CT imaging of the abdomen and pelvis was performed following the standard protocol without IV contrast. COMPARISON:  06/09/2010 FINDINGS: Lower chest: Heart size upper normal. Status post median sternotomy. Coronary artery calcification is evident. Small to  moderate right pleural effusion with tiny left pleural effusion, incompletely visualized. 7 mm posterior left lower lobe pulmonary nodule evident. Hepatobiliary: Subtle nodularity of liver contour suggests cirrhosis. Gallbladder is distended with 17 mm calcified gallstone evident. No intrahepatic or extrahepatic biliary dilation. Pancreas:  Pancreas is diffusely atrophic. Spleen: No splenomegaly. No focal mass lesion. Adrenals/Urinary Tract: Left adrenal thickening without a discrete nodule or mass evident. Right adrenal gland unremarkable. Native kidneys are atrophic bilaterally. Bladder is decompressed. Stomach/Bowel: Stomach is decompressed. Duodenum is normally positioned as is the ligament of Treitz. No small bowel wall thickening. No small bowel dilatation. The terminal ileum is normal. The appendix is normal. The diverticular changes are noted in the left colon and there is mild associated relatively diffuse wall thickening in the sigmoid colon and rectum. Vascular/Lymphatic: There is abdominal aortic atherosclerosis without aneurysm. There is no gastrohepatic or hepatoduodenal ligament lymphadenopathy. No intraperitoneal or retroperitoneal lymphadenopathy. No pelvic sidewall lymphadenopathy. Reproductive: Uterus surgically absent.  There is no adnexal mass. Other: No intraperitoneal free fluid. Diffuse body wall edema evident. Musculoskeletal: Nondisplaced fracture identified in the right inferior pubic ramus (image 75/series 2) without evidence of bony callus. Another for nondisplaced fracture is identified in the posterior right superior pubic ramus near the acetabulum, best visualized on coronal image 25/series 4. bones are diffusely demineralized. Lumbar scoliosis evident. IMPRESSION: 1. Diverticular changes with mild circumferential wall thickening in the sigmoid colon and rectum. No gross rectal mass evident. 2. Nondisplaced acute fractures of the right inferior pubic ramus and posterior aspect of the right superior pubic ramus. 3. Subtle nodularity of liver contour suggests cirrhosis. 4.  Aortic Atherosclerois (ICD10-170.0) 5. Diffuse body wall edema. 6. Cholelithiasis. Electronically Signed   By: Misty Stanley M.D.   On: 05/05/2017 15:08   ASSESSMENT AND PLAN:  [name]  is a 84 y.o. female with a known history of diabetes  mellitus, hypertension, CAD status post CABG, CVA, end-stage renal disease on hemodialysis presents to the emergency room after she was noticed to be hypotensive in the dialysis center with rectal bleeding  *Bright red blood per rectum secondary to sigmoid diverticulitis.  - Will start IV ciprofloxacin and Flagyl--change to Oral augmentin. Pt has had nausea - hgb 7.8--  Transfused 1 unit packed RBCs---9.9 - Monitor hemoglobin closely.  Should improve with diverticulitis improving.  Hold aspirin and Plavix.   Liquid diet for now. -White count improving 22--15K  *End-stage renal disease on hemodialysis. Due for dialysis again on Wednesday.  *Hypertension.  Hold medications at this time.  Restart medications if blood pressure trends up.  *Diabetes mellitus.  Sliding scale insulin.  DVT prophylaxis with SCDs  Spoke with patient's son Jeryn Bertoni. [facility] will [facility] if continues to show improvement. Case discussed with Care Management/Social Worker. Management plans discussed with the patient, family and they are in agreement.  CODE STATUS: DNR  DVT Prophylaxis: SCD  TOTAL TIME TAKING CARE OF THIS PATIENT: *25 minutes.  >50% time spent on counselling and coordination of care  POSSIBLE D/C IN *1* DAYS, DEPENDING ON CLINICAL CONDITION.  Note: This dictation was prepared with Dragon dictation along with smaller phrase technology. Any transcriptional errors that result from this process are unintentional.  Fritzi Mandes M.D on 05/06/2017 at 2:54 PM  Between 7am to 6pm - Pager - [phone]  After 6pm go to [email or web address] - password [email or web address] [facility]  Office  [phone]  CC: Primary care physician; [name], MDPatient ID: Deneise Lever  Leone Payor, female   DOB: 12-08-1920, 84 y.o.   MRN: [id number]

## 2017-05-06 NOTE — Clinical Social Work Note (Signed)
Clinical Social Work Assessment  Patient Details  Name: [name] MRN: [id number] Date of Birth: [birth year]  Date of referral:  05/06/17               Reason for consult:  Facility Placement                Permission sought to share information with:  Facility Sport and exercise psychologist, Tourist information centre manager, Family Supports Permission granted to share information::  Yes, Verbal Permission Granted  Name::      [facility]   Agency::     Relationship::     Contact Information:     Housing/Transportation Living arrangements for the past 2 months:  [place] of Information:  Patient, Adult Children(Charlie Burnett ) Patient Interpreter Needed:  None Criminal Activity/Legal Involvement Pertinent to Current Situation/Hospitalization:  No - Comment as needed Significant Relationships:  Adult Children Lives with:  Self Do you feel safe going back to the place where you live?  Yes Need for family participation in patient care:  Yes (Comment)  Care giving concerns:  Patient was recently at [facility] for rehab. Before that patient lived at home alone.    Social Worker assessment / plan:  CSW consulted because patient came from [facility]. Patient recently discharged from [facility] to [facility] for rehab. Patient states that she does not want to return to [facility]. CSW attempted to speak with patient about her concerns but patient was only oriented to self and could not express her concerns to CSW. Patient gave CSW verbal permission to speak with her son Ieshia Hatcher, [phone]. CSW spoke with son and he would like patient to return to [facility] at discharge. [facility] can take patient when ready for discharge.   Employment status:  Retired Nurse, adult PT Recommendations:  [place] / Referral to community resources:  [place]  Patient/Family's Response to care:  Patient is accepting of  going to SNF   Patient/Family's Understanding of and Emotional Response to Diagnosis, Current Treatment, and Prognosis:  Patient's son is understanding of prognosis and current treatment. He is accepting of SNF placement and would like to come to [facility].   Emotional Assessment Appearance:  Appears stated age Attitude/Demeanor/Rapport:    Affect (typically observed):  Accepting, Calm Orientation:  Oriented to Self Alcohol / Substance use:  Not Applicable Psych involvement (Current and /or in the community):  No (Comment)  Discharge Needs  Concerns to be addressed:  Discharge Planning Concerns Readmission within the last 30 days:  Yes Current discharge risk:  Dependent with Mobility, Chronically ill Barriers to Discharge:  Continued Medical Work up   [employer], [place] 05/06/2017, 3:14 PM

## 2017-05-06 NOTE — NC FL2 (Signed)
[place] LEVEL OF CARE SCREENING TOOL     IDENTIFICATION  Patient Name: [name] Birthdate: [birth year] Sex: female Admission Date (Current Location): 05/05/2017  [place] and [place] Number:  Engineering geologist and Address:  [facility], [address], [place], [place] [address]      Provider Number: [id number]  Attending Physician Name and Address:  Fritzi Mandes, MD  Relative Name and Phone Number:  Cherylin Waguespack- son     Current Level of Care: Hospital Recommended Level of Care: [place] Prior Approval Number:    Date Approved/Denied:   PASRR Number: [id number]  Discharge Plan: SNF    Current Diagnoses: Patient Active Problem List   Diagnosis Date Noted  . GI bleed 05/05/2017  . Pelvic fracture ([place]) 04/26/2017  . Protein-calorie malnutrition, severe 01/07/2017  . CHF (congestive heart failure) ([place]) 01/03/2017  . UTI (urinary tract infection) 01/03/2017  . Acute metabolic encephalopathy 56/21/3086  . Cerebrovascular accident (CVA) due to occlusion of right middle cerebral artery ([place]) 10/24/2015  . Malnutrition of mild degree ([place]) 10/24/2015  . Altered mental status 09/18/2015  . Preventative health care 05/03/2014  . Peripheral vascular disease ([place]) 12/21/2013  . Toe amputation status ([place]) 08/29/2013  . History of recent fall 04/18/2013  . HTN (hypertension), malignant 03/22/2013  . S/P aortic valve replacement with bioprosthetic valve 11/15/2012  . S/P CABG x 3 11/15/2012  . Anemia of chronic kidney failure 12/22/2010  . Thrombocytopenia ([place]) 12/13/2010  . End stage renal disease on dialysis ([place]) 12/13/2010  . S/P aortic valve replacement 08/11/2010  . Arterial insufficiency-lower 07/25/2010  . NEURODERMATITIS 04/08/2009  . Carotid arterial disease ([place]) 01/01/2009  . Coronary atherosclerosis 02/11/2007  . AORTIC STENOSIS 10/04/2006  . ANXIETY 05/30/2006  . Pulmonary hypertension ([place])  05/30/2006  . Hypothyroidism 05/27/2006  . Type 2 diabetes mellitus with renal manifestations, controlled ([place]) 05/27/2006  . Hyperlipidemia 05/27/2006  . Essential hypertension 05/27/2006  . GERD 05/27/2006  . OSTEOARTHRITIS 05/27/2006  . OSTEOPENIA 05/27/2006  . URINARY INCONTINENCE 05/27/2006    Orientation RESPIRATION BLADDER Height & Weight     Self  O2(1 liter ) Incontinent Weight: 106 lb 3.2 oz (48.2 kg) Height:  5\' 3"  (160 cm)  BEHAVIORAL SYMPTOMS/MOOD NEUROLOGICAL BOWEL NUTRITION STATUS  (none) (None) Incontinent Diet(Clear Liquid diet )  AMBULATORY STATUS COMMUNICATION OF NEEDS Skin   Extensive Assist Verbally Skin abrasions                       Personal Care Assistance Level of Assistance  Bathing, Feeding, Dressing Bathing Assistance: Maximum assistance Feeding assistance: Limited assistance Dressing Assistance: Maximum assistance     Functional Limitations Info  Hearing   Hearing Info: Impaired      SPECIAL CARE FACTORS FREQUENCY  PT (By licensed PT), OT (By licensed OT)(Dialysis MWF)                    Contractures Contractures Info: Not present    Additional Factors Info  Code Status, Allergies Code Status Info: DNR Allergies Info: Captopril, Ramipril, Sulfa Antibiotics, Verapamil, Nitrofurantoin, Enalapril Maleate           Current Medications (05/06/2017):  This is the current hospital active medication list Current Facility-Administered Medications  Medication Dose Route Frequency Provider Last Rate Last Dose  . acetaminophen (TYLENOL) tablet 650 mg  650 mg Oral Q6H PRN Hillary Bow, MD       Or  .  acetaminophen (TYLENOL) suppository 650 mg  650 mg Rectal Q6H PRN Sudini, Alveta Heimlich, MD      . albuterol (PROVENTIL) (2.5 MG/3ML) 0.083% nebulizer solution 2.5 mg  2.5 mg Nebulization Q2H PRN Sudini, Srikar, MD      . ciprofloxacin (CIPRO) IVPB 400 mg  400 mg Intravenous Q24H Hillary Bow, MD   Stopped at 05/05/17 2010  . feeding  supplement (ENSURE ENLIVE) (ENSURE ENLIVE) liquid 237 mL  237 mL Oral TID BM Sudini, Srikar, MD   237 mL at 05/05/17 2200  . folic acid (FOLVITE) tablet 0.5 mg  0.5 mg Oral Daily Sudini, Alveta Heimlich, MD   0.5 mg at 05/06/17 1016  . Ganciclovir (ZIRGAN) 0.15 % ophthalmic gel 1 drop  1 drop Right Eye QID PRN Hillary Bow, MD   1 drop at 05/05/17 2208  . hydrALAZINE (APRESOLINE) tablet 25 mg  25 mg Oral Q8H Hillary Bow, MD   Stopped at 05/06/17 [phone]  . HYDROcodone-acetaminophen (NORCO/VICODIN) 5-325 MG per tablet 1-2 tablet  1-2 tablet Oral Q4H PRN Hillary Bow, MD   1 tablet at 05/05/17 2244  . insulin aspart (novoLOG) injection 0-9 Units  0-9 Units Subcutaneous Q4H Hillary Bow, MD   2 Units at 05/06/17 0031  . levothyroxine (SYNTHROID, LEVOTHROID) tablet 75 mcg  75 mcg Oral QAC breakfast Hillary Bow, MD      . MEDLINE mouth rinse  15 mL Mouth Rinse BID Hillary Bow, MD   15 mL at 05/05/17 2158  . metoprolol tartrate (LOPRESSOR) tablet 50 mg  50 mg Oral BID Hillary Bow, MD   50 mg at 05/05/17 2157  . metroNIDAZOLE (FLAGYL) tablet 500 mg  500 mg Oral Q8H Sudini, Srikar, MD   500 mg at 05/06/17 0516  . ondansetron (ZOFRAN) tablet 4 mg  4 mg Oral Q6H PRN Hillary Bow, MD       Or  . ondansetron (ZOFRAN) injection 4 mg  4 mg Intravenous Q6H PRN Sudini, Srikar, MD      . polyethylene glycol (MIRALAX / GLYCOLAX) packet 17 g  17 g Oral Daily PRN Sudini, Alveta Heimlich, MD      . polyvinyl alcohol (LIQUIFILM TEARS) 1.4 % ophthalmic solution 1 drop  1 drop Both Eyes PRN Hillary Bow, MD   1 drop at 05/05/17 2209     Discharge Medications: Please see discharge summary for a list of discharge medications.  Relevant Imaging Results:  Relevant Lab Results:   Additional Information SSN [id number]  Annamaria Boots, [place]

## 2017-05-06 NOTE — Progress Notes (Signed)
[facility]  ROUNDING NOTE   Subjective:    Patient is doing fair today.  Hemoglobin 9.1 Blood pressure is better controlled 117/48 Potassium is normal at 4.4 Patient is very hard of hearing therefore conversation is limited Denies any acute shortness of breath  Vital signs in last 24 hours:  Temp:  [97.6 F (36.4 C)-98.6 F (37 C)] 98 F (36.7 C) (05/02 1110) Pulse Rate:  [58-69] 67 (05/02 1110) Resp:  [14-20] 19 (05/02 1110) BP: (114-143)/(38-56) 115/51 (05/02 1110) SpO2:  [94 %-100 %] 99 % (05/02 1110)  Weight change:  Filed Weights   05/05/17 1308  Weight: 106 lb 3.2 oz (48.2 kg)    Intake/Output: I/O last 3 completed shifts: In: 5462 [P.O.:237; Blood:633; IV Piggyback:200] Out: 0    Intake/Output this shift:  Total I/O In: 360 [P.O.:360] Out: 0   Physical Exam: General: NAD,   Head: Normocephalic, atraumatic.  Dry oral mucosal membranes  Eyes: Anicteric,  Neck: Supple, trachea midline  Lungs:  Clear to auscultation  Heart: Regular rate and rhythm  Abdomen:  Soft, nontender,   Extremities:  no peripheral edema.  Neurologic: alert.  Able to answer few questions  Skin: Scattered eccymosis  Access:  Left upper extremity AVF    Basic Metabolic Panel: Recent Labs  Lab 05/05/17 1312 05/06/17 0426  NA 134* 132*  K 3.8 4.4  CL 94* 93*  CO2 29 30  GLUCOSE 123* 105*  BUN 26* 38*  CREATININE 3.08* 3.89*  CALCIUM 7.8* 7.9*    Liver Function Tests: Recent Labs  Lab 05/05/17 1312  AST 25  ALT 8*  ALKPHOS 141*  BILITOT 1.2  PROT 7.0  ALBUMIN 2.8*   No results for input(s): LIPASE, AMYLASE in the last 168 hours. No results for input(s): AMMONIA in the last 168 hours.  CBC: Recent Labs  Lab 05/05/17 1312 05/06/17 0426  WBC 22.5* 15.5*  HGB 7.8* 9.1*  HCT 23.6* 27.5*  MCV 107.1* 102.5*  PLT 189 187    Cardiac Enzymes: No results for input(s): CKTOTAL, CKMB, CKMBINDEX, TROPONINI in the last 168 hours.  BNP: Invalid  input(s): POCBNP  CBG: Recent Labs  Lab 05/05/17 2113 05/06/17 0000 05/06/17 0347 05/06/17 0746 05/06/17 1130  GLUCAP 142* 154* 109* 34 168*    Microbiology: Results for orders placed or performed during the hospital encounter of 05/05/17  MRSA PCR Screening     Status: None   Collection Time: 05/06/17  4:27 AM  Result Value Ref Range Status   MRSA by PCR NEGATIVE NEGATIVE Final    Comment:        The GeneXpert MRSA Assay (FDA approved for NASAL specimens only), is one component of a comprehensive MRSA colonization surveillance program. It is not intended to diagnose MRSA infection nor to guide or monitor treatment for MRSA infections. Performed at [facility], [address] [place], [place] [address]     Coagulation Studies: Recent Labs    05/05/17 1312  LABPROT 15.8*  INR 1.27    Urinalysis: No results for input(s): COLORURINE, LABSPEC, PHURINE, GLUCOSEU, HGBUR, BILIRUBINUR, KETONESUR, PROTEINUR, UROBILINOGEN, NITRITE, LEUKOCYTESUR in the last 72 hours.  Invalid input(s): APPERANCEUR    Imaging: Ct Abdomen Pelvis Wo Contrast  Result Date: 05/05/2017 CLINICAL DATA:  Hypotension. EXAM: CT ABDOMEN AND PELVIS WITHOUT CONTRAST TECHNIQUE: Multidetector CT imaging of the abdomen and pelvis was performed following the standard protocol without IV contrast. COMPARISON:  06/09/2010 FINDINGS: Lower chest: Heart size upper normal. Status post median  sternotomy. Coronary artery calcification is evident. Small to moderate right pleural effusion with tiny left pleural effusion, incompletely visualized. 7 mm posterior left lower lobe pulmonary nodule evident. Hepatobiliary: Subtle nodularity of liver contour suggests cirrhosis. Gallbladder is distended with 17 mm calcified gallstone evident. No intrahepatic or extrahepatic biliary dilation. Pancreas: Pancreas is diffusely atrophic. Spleen: No splenomegaly. No focal mass lesion. Adrenals/Urinary Tract: Left adrenal  thickening without a discrete nodule or mass evident. Right adrenal gland unremarkable. Native kidneys are atrophic bilaterally. Bladder is decompressed. Stomach/Bowel: Stomach is decompressed. Duodenum is normally positioned as is the ligament of Treitz. No small bowel wall thickening. No small bowel dilatation. The terminal ileum is normal. The appendix is normal. The diverticular changes are noted in the left colon and there is mild associated relatively diffuse wall thickening in the sigmoid colon and rectum. Vascular/Lymphatic: There is abdominal aortic atherosclerosis without aneurysm. There is no gastrohepatic or hepatoduodenal ligament lymphadenopathy. No intraperitoneal or retroperitoneal lymphadenopathy. No pelvic sidewall lymphadenopathy. Reproductive: Uterus surgically absent.  There is no adnexal mass. Other: No intraperitoneal free fluid. Diffuse body wall edema evident. Musculoskeletal: Nondisplaced fracture identified in the right inferior pubic ramus (image 75/series 2) without evidence of bony callus. Another for nondisplaced fracture is identified in the posterior right superior pubic ramus near the acetabulum, best visualized on coronal image 25/series 4. bones are diffusely demineralized. Lumbar scoliosis evident. IMPRESSION: 1. Diverticular changes with mild circumferential wall thickening in the sigmoid colon and rectum. No gross rectal mass evident. 2. Nondisplaced acute fractures of the right inferior pubic ramus and posterior aspect of the right superior pubic ramus. 3. Subtle nodularity of liver contour suggests cirrhosis. 4.  Aortic Atherosclerois (ICD10-170.0) 5. Diffuse body wall edema. 6. Cholelithiasis. Electronically Signed   By: Misty Stanley M.D.   On: 05/05/2017 15:08     Medications:   . ciprofloxacin Stopped (05/05/17 2010)   . feeding supplement  1 Container Oral TID BM  . feeding supplement (ENSURE ENLIVE)  237 mL Oral TID BM  . folic acid  0.5 mg Oral Daily  .  hydrALAZINE  25 mg Oral Q8H  . insulin aspart  0-9 Units Subcutaneous Q4H  . levothyroxine  75 mcg Oral QAC breakfast  . mouth rinse  15 mL Mouth Rinse BID  . metoprolol tartrate  50 mg Oral BID  . metroNIDAZOLE  500 mg Oral Q8H  . multivitamin  1 tablet Oral QHS  . vitamin C  250 mg Oral BID     Assessment/ Plan:  [name] is a 84 y.o. white female with end stage renal disease on hemodialysis, coronary artery disease, history of bilateral knee replacement, aortic valve replacement 2009, diabetes, hypertension, hypothyroidism, peripheral vascular disease, pulmonary hypertension, anxiety  [facility]/MWF  1.  ESRD 2.  Anemia of CKD and GI bleed  At present, she feels comfortable. Denies SOB. No s/s of fluid overload Her potassium is 4.4. Routine hemodialysis will be scheduled for tomorrow   LOS: 1 Fleda Pagel 5/2/20191:33 PM

## 2017-05-06 NOTE — Progress Notes (Signed)
Initial Nutrition Assessment  DOCUMENTATION CODES:   Severe malnutrition in context of chronic illness  INTERVENTION:   Boost Breeze po TID, each supplement provides 250 kcal and 9 grams of protein  Ensure Enlive po TID, each supplement provides 350 kcal and 20 grams of protein  Magic cup TID with meals, each supplement provides 290 kcal and 9 grams of protein  Rena-vite daily  Vitamin C 250mg  po BID  NUTRITION DIAGNOSIS:   Severe Malnutrition related to chronic illness(ESRD on HD, CHF) as evidenced by moderate fat depletion, severe muscle depletion.  GOAL:   Patient will meet greater than or equal to 90% of their needs  MONITOR:   PO intake, Supplement acceptance, Labs, Weight trends, I & O's, Skin  REASON FOR ASSESSMENT:   Other (Comment)(low BMI)    ASSESSMENT:   84 y.o. female with a known history of diabetes mellitus, hypertension, CAD status post CABG, CVA, end-stage renal disease on hemodialysis presents to the emergency room after she was noticed to be hypotensive in the dialysis center with rectal bleeding.   Visited pt's room today. RD is familiar with this pt from multiple previous admits. Pt with poor appetite and oral intake at baseline. Pt is a poor historian and is unable to provide any history today. Pt has reported on previous admits that she likes chocolate Ensure. Pt with ESRD on HD and with suspected scurvy. Pt noted to have bruising on bilateral arms and legs. Per chart, pt with wt gain since last admit in January. Pt currently on clear liquid diet but ordered for Ensure. Will order Boost Breeze while pt on clears. Recommend vitamin C supplementation 250mg  po BID x 30 days.    Medications reviewed and include: folic acid, insulin, synthroid, metronidazole, ciprofloxacin    Labs reviewed: Na 132(L), Cl 93(L), BUN 38(H), creat 3.89(H), Ca 7.9(L) Wbc- 15.5(H), Hgb 9.1(L), Hct 27.5(L)  NUTRITION - FOCUSED PHYSICAL EXAM:    Most Recent Value  Orbital  Region  Mild depletion  Upper Arm Region  Moderate depletion  Thoracic and Lumbar Region  Moderate depletion  Buccal Region  Mild depletion  Temple Region  Moderate depletion  Clavicle Bone Region  Severe depletion  Clavicle and Acromion Bone Region  Severe depletion  Scapular Bone Region  Moderate depletion  Dorsal Hand  Severe depletion  Patellar Region  Severe depletion  Anterior Thigh Region  Severe depletion  Posterior Calf Region  Severe depletion  Edema (RD Assessment)  Mild  Hair  Reviewed  Eyes  Reviewed  Mouth  Reviewed  Skin  Reviewed  Nails  Reviewed     Diet Order:   Diet Order           Diet clear liquid Room service appropriate? Yes; Fluid consistency: Thin  Diet effective now         EDUCATION NEEDS:   Not appropriate for education at this time  Skin:  Skin Assessment: (laceration head)  Last BM:  5/1  Height:   Ht Readings from Last 1 Encounters:  05/05/17 5\' 3"  (1.6 m)    Weight:   Wt Readings from Last 1 Encounters:  05/05/17 106 lb 3.2 oz (48.2 kg)    Ideal Body Weight:  52.3 kg  BMI:  Body mass index is 18.81 kg/m.  Estimated Nutritional Needs:   Kcal:  1200-1400kcal/day   Protein:  72-82g/day   Fluid:  >1.2L/day   Koleen Distance MS, RD, LDN Pager #[phone] After Hours Pager: [phone]

## 2017-05-06 NOTE — Care Management (Signed)
Amanda Morris dialysis liaison notified of admission.    

## 2017-05-07 DIAGNOSIS — N186 End stage renal disease: Secondary | ICD-10-CM | POA: Diagnosis not present

## 2017-05-07 DIAGNOSIS — K5792 Diverticulitis of intestine, part unspecified, without perforation or abscess without bleeding: Secondary | ICD-10-CM | POA: Diagnosis not present

## 2017-05-07 DIAGNOSIS — I272 Pulmonary hypertension, unspecified: Secondary | ICD-10-CM | POA: Diagnosis not present

## 2017-05-07 DIAGNOSIS — I5032 Chronic diastolic (congestive) heart failure: Secondary | ICD-10-CM | POA: Diagnosis not present

## 2017-05-07 DIAGNOSIS — Z87891 Personal history of nicotine dependence: Secondary | ICD-10-CM | POA: Diagnosis not present

## 2017-05-07 DIAGNOSIS — Z7982 Long term (current) use of aspirin: Secondary | ICD-10-CM | POA: Diagnosis not present

## 2017-05-07 DIAGNOSIS — Y95 Nosocomial condition: Secondary | ICD-10-CM | POA: Diagnosis present

## 2017-05-07 DIAGNOSIS — Z951 Presence of aortocoronary bypass graft: Secondary | ICD-10-CM | POA: Diagnosis not present

## 2017-05-07 DIAGNOSIS — K625 Hemorrhage of anus and rectum: Secondary | ICD-10-CM | POA: Diagnosis present

## 2017-05-07 DIAGNOSIS — K921 Melena: Secondary | ICD-10-CM | POA: Diagnosis not present

## 2017-05-07 DIAGNOSIS — Z79890 Hormone replacement therapy (postmenopausal): Secondary | ICD-10-CM | POA: Diagnosis not present

## 2017-05-07 DIAGNOSIS — Z992 Dependence on renal dialysis: Secondary | ICD-10-CM | POA: Diagnosis not present

## 2017-05-07 DIAGNOSIS — K219 Gastro-esophageal reflux disease without esophagitis: Secondary | ICD-10-CM | POA: Diagnosis not present

## 2017-05-07 DIAGNOSIS — A0472 Enterocolitis due to Clostridium difficile, not specified as recurrent: Secondary | ICD-10-CM | POA: Diagnosis not present

## 2017-05-07 DIAGNOSIS — K5713 Diverticulitis of small intestine without perforation or abscess with bleeding: Secondary | ICD-10-CM | POA: Diagnosis not present

## 2017-05-07 DIAGNOSIS — N2581 Secondary hyperparathyroidism of renal origin: Secondary | ICD-10-CM | POA: Diagnosis not present

## 2017-05-07 DIAGNOSIS — E569 Vitamin deficiency, unspecified: Secondary | ICD-10-CM | POA: Diagnosis not present

## 2017-05-07 DIAGNOSIS — I10 Essential (primary) hypertension: Secondary | ICD-10-CM | POA: Diagnosis not present

## 2017-05-07 DIAGNOSIS — D62 Acute posthemorrhagic anemia: Secondary | ICD-10-CM | POA: Diagnosis not present

## 2017-05-07 DIAGNOSIS — M6281 Muscle weakness (generalized): Secondary | ICD-10-CM | POA: Diagnosis not present

## 2017-05-07 DIAGNOSIS — E1122 Type 2 diabetes mellitus with diabetic chronic kidney disease: Secondary | ICD-10-CM | POA: Diagnosis not present

## 2017-05-07 DIAGNOSIS — I959 Hypotension, unspecified: Secondary | ICD-10-CM | POA: Diagnosis not present

## 2017-05-07 DIAGNOSIS — S329XXD Fracture of unspecified parts of lumbosacral spine and pelvis, subsequent encounter for fracture with routine healing: Secondary | ICD-10-CM | POA: Diagnosis not present

## 2017-05-07 DIAGNOSIS — D631 Anemia in chronic kidney disease: Secondary | ICD-10-CM | POA: Diagnosis not present

## 2017-05-07 DIAGNOSIS — E119 Type 2 diabetes mellitus without complications: Secondary | ICD-10-CM | POA: Diagnosis not present

## 2017-05-07 DIAGNOSIS — E039 Hypothyroidism, unspecified: Secondary | ICD-10-CM | POA: Diagnosis not present

## 2017-05-07 DIAGNOSIS — K5733 Diverticulitis of large intestine without perforation or abscess with bleeding: Secondary | ICD-10-CM | POA: Diagnosis not present

## 2017-05-07 DIAGNOSIS — Z7401 Bed confinement status: Secondary | ICD-10-CM | POA: Diagnosis not present

## 2017-05-07 DIAGNOSIS — E785 Hyperlipidemia, unspecified: Secondary | ICD-10-CM | POA: Diagnosis not present

## 2017-05-07 DIAGNOSIS — J189 Pneumonia, unspecified organism: Secondary | ICD-10-CM | POA: Diagnosis not present

## 2017-05-07 DIAGNOSIS — K626 Ulcer of anus and rectum: Secondary | ICD-10-CM | POA: Diagnosis not present

## 2017-05-07 DIAGNOSIS — K922 Gastrointestinal hemorrhage, unspecified: Secondary | ICD-10-CM | POA: Diagnosis not present

## 2017-05-07 DIAGNOSIS — Z7902 Long term (current) use of antithrombotics/antiplatelets: Secondary | ICD-10-CM | POA: Diagnosis not present

## 2017-05-07 DIAGNOSIS — E871 Hypo-osmolality and hyponatremia: Secondary | ICD-10-CM | POA: Diagnosis not present

## 2017-05-07 DIAGNOSIS — J181 Lobar pneumonia, unspecified organism: Secondary | ICD-10-CM | POA: Diagnosis not present

## 2017-05-07 DIAGNOSIS — K640 First degree hemorrhoids: Secondary | ICD-10-CM | POA: Diagnosis not present

## 2017-05-07 DIAGNOSIS — I509 Heart failure, unspecified: Secondary | ICD-10-CM | POA: Diagnosis not present

## 2017-05-07 DIAGNOSIS — I350 Nonrheumatic aortic (valve) stenosis: Secondary | ICD-10-CM | POA: Diagnosis not present

## 2017-05-07 DIAGNOSIS — I132 Hypertensive heart and chronic kidney disease with heart failure and with stage 5 chronic kidney disease, or end stage renal disease: Secondary | ICD-10-CM | POA: Diagnosis not present

## 2017-05-07 DIAGNOSIS — R262 Difficulty in walking, not elsewhere classified: Secondary | ICD-10-CM | POA: Diagnosis not present

## 2017-05-07 DIAGNOSIS — I2510 Atherosclerotic heart disease of native coronary artery without angina pectoris: Secondary | ICD-10-CM | POA: Diagnosis not present

## 2017-05-07 LAB — GLUCOSE, CAPILLARY
GLUCOSE-CAPILLARY: 100 mg/dL — AB (ref 65–99)
GLUCOSE-CAPILLARY: 137 mg/dL — AB (ref 65–99)
GLUCOSE-CAPILLARY: 192 mg/dL — AB (ref 65–99)
Glucose-Capillary: 151 mg/dL — ABNORMAL HIGH (ref 65–99)
Glucose-Capillary: 63 mg/dL — ABNORMAL LOW (ref 65–99)
Glucose-Capillary: 182 mg/dL — ABNORMAL HIGH (ref 65–99)

## 2017-05-07 LAB — CBC
HCT: 29.8 % — ABNORMAL LOW (ref 35.0–47.0)
HEMOGLOBIN: 10 g/dL — AB (ref 12.0–16.0)
MCH: 34.3 pg — ABNORMAL HIGH (ref 26.0–34.0)
MCHC: 33.6 g/dL (ref 32.0–36.0)
MCV: 102.3 fL — ABNORMAL HIGH (ref 80.0–100.0)
Platelets: 215 10*3/uL (ref 150–440)
RBC: 2.91 MIL/uL — AB (ref 3.80–5.20)
RDW: 18.8 % — ABNORMAL HIGH (ref 11.5–14.5)
WBC: 11.3 10*3/uL — AB (ref 3.6–11.0)

## 2017-05-07 LAB — PHOSPHORUS: Phosphorus: 4.1 mg/dL (ref 2.5–4.6)

## 2017-05-07 LAB — RENAL FUNCTION PANEL
ALBUMIN: 2.7 g/dL — AB (ref 3.5–5.0)
ANION GAP: 13 (ref 5–15)
BUN: 57 mg/dL — ABNORMAL HIGH (ref 6–20)
CALCIUM: 8.1 mg/dL — AB (ref 8.9–10.3)
CO2: 26 mmol/L (ref 22–32)
Chloride: 90 mmol/L — ABNORMAL LOW (ref 101–111)
Creatinine, Ser: 4.98 mg/dL — ABNORMAL HIGH (ref 0.44–1.00)
GFR, EST AFRICAN AMERICAN: 8 mL/min — AB (ref >60)
GFR, EST NON AFRICAN AMERICAN: 7 mL/min — AB (ref >60)
Glucose, Bld: 214 mg/dL — ABNORMAL HIGH (ref 65–99)
Phosphorus: 4.1 mg/dL (ref 2.5–4.6)
Potassium: 4.6 mmol/L (ref 3.5–5.1)
SODIUM: 129 mmol/L — AB (ref 135–145)

## 2017-05-07 LAB — HEMOGLOBIN: Hemoglobin: 10.3 g/dL — ABNORMAL LOW (ref 12.0–16.0)

## 2017-05-07 MED ORDER — AMOXICILLIN-POT CLAVULANATE 500-125 MG PO TABS: 1.0000 | ORAL_TABLET | ORAL | 0 refills | Status: DC

## 2017-05-07 MED ORDER — CLOPIDOGREL BISULFATE 75 MG PO TABS: 75.0000 mg | ORAL_TABLET | Freq: Every day | ORAL | 1 refills | Status: DC

## 2017-05-07 MED ORDER — EPOETIN ALFA 10000 UNIT/ML IJ SOLN
4000.0000 [IU] | INTRAMUSCULAR | Status: DC
Start: 2017-05-07 — End: 2017-05-07
  Administered 2017-05-07: 4000 [IU] via INTRAVENOUS

## 2017-05-07 MED ORDER — ASPIRIN EC 81 MG PO TBEC: 81.0000 mg | DELAYED_RELEASE_TABLET | Freq: Every day | ORAL | 0 refills | Status: DC

## 2017-05-07 NOTE — Progress Notes (Signed)
HD tx ended    05/07/17 1530  Vital Signs  Pulse Rate 86  Pulse Rate Source Monitor  Resp (!) 25  BP (!) 192/57  BP Location Right Arm  BP Method Automatic  Patient Position (if appropriate) Lying  Oxygen Therapy  SpO2 100 %  O2 Device Nasal Cannula  O2 Flow Rate (L/min) 2 L/min  Pulse Oximetry Type Continuous  During Hemodialysis Assessment  HD Safety Checks Performed Yes  Dialysis Fluid Bolus Normal Saline  Bolus Amount (mL) 250 mL  Intra-Hemodialysis Comments Tx completed;Tolerated well

## 2017-05-07 NOTE — Discharge Summary (Signed)
[facility] [place] NAME: [name]    MR#:  [id number]  DATE OF BIRTH:  03-21-1920  DATE OF ADMISSION:  05/05/2017 ADMITTING PHYSICIAN: Hillary Bow, MD  DATE OF DISCHARGE: 05/07/2017  PRIMARY CARE PHYSICIAN: [name], MD    ADMISSION DIAGNOSIS:  Diverticulitis [K57.92] Lower GI bleed [K92.2] Multiple closed fractures of pelvis without disruption of pelvic ring, initial encounter ([place]) [S32.82XA]  DISCHARGE DIAGNOSIS:  lower G.I. bleed suspected due to acute sigmoid diverticulitis  SECONDARY DIAGNOSIS:   Past Medical History:  Diagnosis Date  . Anemia    NOS  . Anxiety   . Aortic stenosis   . Cancer [facility])    Mole on top of head to be removed in July 2013  . Chronic diastolic CHF (congestive heart failure) ([place])    a. 01/2017 Echo: EF 60-65%, no rwma, Gr2 DD, mild to mod MR, mildly dil LA/RA, nl RV fxn, Sev TR, PASP 24mmHg.  . Constipation   . Coronary artery disease    a. 2009 CABG x 3 w/ bioprosthetic AVR.  . Diabetes mellitus    type II;was on Glipizide but has been off x 67mon  . Dialysis patient ([place])   . Diarrhea   . ESRD (end stage renal disease) ([place])   . GERD (gastroesophageal reflux disease)   . History of blood transfusion   . Hyperlipidemia    takes Simvastatin nightly  . Hypertension    takes Metoprolol daily  . Hypothyroidism    takes Synthroid daily  . Migraine    hx of  . Oligouria   . Osteoarthritis   . Osteopenia   . Peripheral vascular disease ([place])   . Personal history of colonic polyps   . Pulmonary hypertension ([place])   . Urinary incontinence   . UTI (urinary tract infection)     HOSPITAL COURSE:   AnnieHoltis a84 y.o.femalewith a known history of diabetes mellitus, hypertension, CAD status post CABG, CVA, end-stage renal disease on hemodialysis presents to the emergency room after she was noticed to be hypotensive in the dialysis center with rectal bleeding  *Bright  red blood per rectum secondary to sigmoid diverticulitis.  -Will start IV ciprofloxacin and Flagyl--change to Oral augmentin. Pt has had nausea - hgb 7.8-- Transfused 1 unit packed RBCs---9.9--10.3 -Monitor hemoglobin closely. Should improve with diverticulitis improving.  Hold aspirin and Plavix for 1 week Liquid diet for now. -White count improving 22--15K  *End-stage renal disease on hemodialysis. Will get HD done today  *Hypertension.  -BP stable Cont BB and amlodipine D/ced clonidine and hydralazine (dont want to bottom down her BP)  *Diabetes mellitus.  -Sliding scale insulin.  DVT prophylaxis with SCDs  Overall stable D/c today after HD   CONSULTS OBTAINED:  Treatment Team:  Murlean Iba, MD  DRUG ALLERGIES:   Allergies  Allergen Reactions  . Nitrofurantoin Itching  . Captopril Other (See Comments)    REACTION: unspecified  . Enalapril Maleate Cough  . Ramipril Other (See Comments)    REACTION: unspecified  . Sulfa Antibiotics Other (See Comments)    Reaction unknown  . Verapamil Other (See Comments)    REACTION: unspecified    DISCHARGE MEDICATIONS:   Allergies as of 05/07/2017      Reactions   Nitrofurantoin Itching   Captopril Other (See Comments)   REACTION: unspecified   Enalapril Maleate Cough   Ramipril Other (See Comments)   REACTION: unspecified   Sulfa Antibiotics Other (See Comments)  Reaction unknown   Verapamil Other (See Comments)   REACTION: unspecified      Medication List    STOP taking these medications   cloNIDine 0.1 MG tablet Commonly known as:  CATAPRES   hydrALAZINE 25 MG tablet Commonly known as:  APRESOLINE     TAKE these medications   acetaminophen 325 MG tablet Commonly known as:  TYLENOL Take 1 tablet (325 mg total) by mouth every 6 (six) hours as needed for mild pain or fever (or Fever >/= 101).   albuterol (2.5 MG/3ML) 0.083% nebulizer solution Commonly known as:  PROVENTIL Take 3 mLs (2.5 mg  total) by nebulization every 2 (two) hours as needed for wheezing.   amLODipine 5 MG tablet Commonly known as:  NORVASC TAKE ONE TABLET BY MOUTH ONCE DAILY   amoxicillin-clavulanate 500-125 MG tablet Commonly known as:  AUGMENTIN Take 1 tablet (500 mg total) by mouth daily.   ascorbic acid 500 MG tablet Commonly known as:  VITAMIN C Take 1 tablet (500 mg total) by mouth daily.   aspirin EC 81 MG tablet Take 1 tablet (81 mg total) by mouth at bedtime. Start on may 10 th Start taking on:  05/14/2017 What changed:    additional instructions  These instructions start on 05/14/2017. If you are unsure what to do until then, ask your doctor or other care provider.   calcium carbonate 500 MG chewable tablet Commonly known as:  TUMS - dosed in mg elemental calcium Chew 1 tablet (200 mg of elemental calcium total) by mouth 3 (three) times daily with meals.   clopidogrel 75 MG tablet Commonly known as:  PLAVIX Take 1 tablet (75 mg total) by mouth daily. Start taking on:  05/14/2017 What changed:  These instructions start on 05/14/2017. If you are unsure what to do until then, ask your doctor or other care provider.   feeding supplement (ENSURE ENLIVE) Liqd Take 237 mLs by mouth 3 (three) times daily between meals.   folic acid 0.5 MG tablet Commonly known as:  FOLVITE Take 0.5 tablets (0.5 mg total) by mouth daily.   Ganciclovir 0.15 % Gel Commonly known as:  ZIRGAN Place 1 drop into the right eye 4 (four) times daily.   HYDROcodone-acetaminophen 5-325 MG tablet Commonly known as:  NORCO/VICODIN Take 1-2 tablets by mouth every 4 (four) hours as needed for moderate pain.   levothyroxine 75 MCG tablet Commonly known as:  SYNTHROID, LEVOTHROID TAKE ONE TABLET BY MOUTH ONCE DAILY   metoprolol tartrate 50 MG tablet Commonly known as:  LOPRESSOR TAKE ONE TABLET BY MOUTH TWICE DAILY   multivitamin Tabs tablet Take 1 tablet by mouth at bedtime.   ondansetron 4 MG tablet Commonly  known as:  ZOFRAN Take 1 tablet (4 mg total) by mouth every 6 (six) hours as needed for nausea.   polyvinyl alcohol 1.4 % ophthalmic solution Commonly known as:  LIQUIFILM TEARS Place 1 drop into both eyes as needed for dry eyes.   pravastatin 40 MG tablet Commonly known as:  PRAVACHOL TAKE ONE TABLET BY MOUTH IN THE EVENING   senna-docusate 8.6-50 MG tablet Commonly known as:  Senokot-S Take 1 tablet by mouth at bedtime as needed for mild constipation.       If you experience worsening of your admission symptoms, develop shortness of breath, life threatening emergency, suicidal or homicidal thoughts you must seek medical attention immediately by calling 911 or calling your MD immediately  if symptoms less severe.  You Must read complete  instructions/literature along with all the possible adverse reactions/side effects for all the Medicines you take and that have been prescribed to you. Take any new Medicines after you have completely understood and accept all the possible adverse reactions/side effects.   Please note  You were cared for by a hospitalist during your hospital stay. If you have any questions about your discharge medications or the care you received while you were in the hospital after you are discharged, you can call the unit and asked to speak with the hospitalist on call if the hospitalist that took care of you is not available. Once you are discharged, your primary care physician will handle any further medical issues. Please note that NO REFILLS for any discharge medications will be authorized once you are discharged, as it is imperative that you return to your primary care physician (or establish a relationship with a primary care physician if you do not have one) for your aftercare needs so that they can reassess your need for medications and monitor your lab values. Today   SUBJECTIVE    Seen at HD no  Rectal bleed per RN VITAL SIGNS:  Blood pressure (!) 132/51,  pulse 64, temperature 98 F (36.7 C), temperature source Oral, resp. rate 20, height 5\' 3"  (1.6 m), weight 48.2 kg (106 lb 3.2 oz), SpO2 100 %.  I/O:    Intake/Output Summary (Last 24 hours) at 05/07/2017 1317 Last data filed at 05/07/2017 1011 Gross per 24 hour  Intake 940 ml  Output -  Net 940 ml    PHYSICAL EXAMINATION:  GENERAL:  84 y.o.-year-old patient lying in the bed with no acute distress.  EYES: Pupils equal, round, reactive to light and accommodation. No scleral icterus. Extraocular muscles intact.  HEENT: Head atraumatic, normocephalic. Oropharynx and nasopharynx clear. Hard on hearing NECK:  Supple, no jugular venous distention. No thyroid enlargement, no tenderness.  LUNGS: Normal breath sounds bilaterally, no wheezing, rales,rhonchi or crepitation. No use of accessory muscles of respiration.  CARDIOVASCULAR: S1, S2 normal. No murmurs, rubs, or gallops.  ABDOMEN: Soft, non-tender, non-distended. Bowel sounds present. No organomegaly or mass.  EXTREMITIES: No pedal edema, cyanosis, or clubbing.  NEUROLOGIC: Cranial nerves II through XII are intact. Muscle strength 4/5 in all extremities. Sensation intact. Gait not checked.  PSYCHIATRIC: The patient is alert and awake SKIN: No obvious rash, lesion, or ulcer.   DATA REVIEW:   CBC  Recent Labs  Lab 05/06/17 0426 05/07/17 0517  WBC 15.5*  --   HGB 9.1* 10.3*  HCT 27.5*  --   PLT 187  --     Chemistries  Recent Labs  Lab 05/05/17 1312 05/06/17 0426  NA 134* 132*  K 3.8 4.4  CL 94* 93*  CO2 29 30  GLUCOSE 123* 105*  BUN 26* 38*  CREATININE 3.08* 3.89*  CALCIUM 7.8* 7.9*  AST 25  --   ALT 8*  --   ALKPHOS 141*  --   BILITOT 1.2  --     Microbiology Results   Recent Results (from the past 240 hour(s))  MRSA PCR Screening     Status: None   Collection Time: 05/06/17  4:27 AM  Result Value Ref Range Status   MRSA by PCR NEGATIVE NEGATIVE Final    Comment:        The GeneXpert MRSA Assay (FDA approved  for NASAL specimens only), is one component of a comprehensive MRSA colonization surveillance program. It is not intended to diagnose MRSA infection  nor to guide or monitor treatment for MRSA infections. Performed at [facility], [address], [place], [place] [address]     RADIOLOGY:  Ct Abdomen Pelvis Wo Contrast  Result Date: 05/05/2017 CLINICAL DATA:  Hypotension. EXAM: CT ABDOMEN AND PELVIS WITHOUT CONTRAST TECHNIQUE: Multidetector CT imaging of the abdomen and pelvis was performed following the standard protocol without IV contrast. COMPARISON:  06/09/2010 FINDINGS: Lower chest: Heart size upper normal. Status post median sternotomy. Coronary artery calcification is evident. Small to moderate right pleural effusion with tiny left pleural effusion, incompletely visualized. 7 mm posterior left lower lobe pulmonary nodule evident. Hepatobiliary: Subtle nodularity of liver contour suggests cirrhosis. Gallbladder is distended with 17 mm calcified gallstone evident. No intrahepatic or extrahepatic biliary dilation. Pancreas: Pancreas is diffusely atrophic. Spleen: No splenomegaly. No focal mass lesion. Adrenals/Urinary Tract: Left adrenal thickening without a discrete nodule or mass evident. Right adrenal gland unremarkable. Native kidneys are atrophic bilaterally. Bladder is decompressed. Stomach/Bowel: Stomach is decompressed. Duodenum is normally positioned as is the ligament of Treitz. No small bowel wall thickening. No small bowel dilatation. The terminal ileum is normal. The appendix is normal. The diverticular changes are noted in the left colon and there is mild associated relatively diffuse wall thickening in the sigmoid colon and rectum. Vascular/Lymphatic: There is abdominal aortic atherosclerosis without aneurysm. There is no gastrohepatic or hepatoduodenal ligament lymphadenopathy. No intraperitoneal or retroperitoneal lymphadenopathy. No pelvic sidewall lymphadenopathy.  Reproductive: Uterus surgically absent.  There is no adnexal mass. Other: No intraperitoneal free fluid. Diffuse body wall edema evident. Musculoskeletal: Nondisplaced fracture identified in the right inferior pubic ramus (image 75/series 2) without evidence of bony callus. Another for nondisplaced fracture is identified in the posterior right superior pubic ramus near the acetabulum, best visualized on coronal image 25/series 4. bones are diffusely demineralized. Lumbar scoliosis evident. IMPRESSION: 1. Diverticular changes with mild circumferential wall thickening in the sigmoid colon and rectum. No gross rectal mass evident. 2. Nondisplaced acute fractures of the right inferior pubic ramus and posterior aspect of the right superior pubic ramus. 3. Subtle nodularity of liver contour suggests cirrhosis. 4.  Aortic Atherosclerois (ICD10-170.0) 5. Diffuse body wall edema. 6. Cholelithiasis. Electronically Signed   By: Misty Stanley M.D.   On: 05/05/2017 15:08     Management plans discussed with the patient, family and they are in agreement.  CODE STATUS:     Code Status Orders  (From admission, onward)        Start     Ordered   05/05/17 1547  Do not attempt resuscitation (DNR)  Continuous    Question Answer Comment  In the event of cardiac or respiratory ARREST Do not call a "code blue"   In the event of cardiac or respiratory ARREST Do not perform Intubation, CPR, defibrillation or ACLS   In the event of cardiac or respiratory ARREST Use medication by any route, position, wound care, and other measures to relive pain and suffering. May use oxygen, suction and manual treatment of airway obstruction as needed for comfort.      05/05/17 1547    Code Status History    Date Active Date Inactive Code Status Order ID Comments User Context   04/26/2017 1709 04/28/2017 2046 DNR [id number]  Demetrios Loll, MD Inpatient   01/03/2017 1328 01/08/2017 2014 DNR [id number]  Idelle Crouch, MD Inpatient    01/03/2017 1031 01/03/2017 1328 DNR [id number]  Merlyn Lot, MD ED   09/18/2015 1817 09/22/2015 1920 DNR [id number]  Posey Pronto,  Gus Height, MD Inpatient   09/18/2015 1516 09/18/2015 1817 DNR [id number]  Flora Lipps, MD ED   03/22/2013 2100 03/26/2013 1540 Full Code [id number]  Rise Patience, MD Inpatient   11/20/2012 1756 12/06/2012 2008 DNR [id number]  Annita Brod, MD Inpatient   06/18/2011 1042 06/19/2011 1401 Full Code [id number]  Paulene Floor, RN Inpatient   12/14/2010 1124 12/29/2010 1543 DNR [id number]  Bonnielee Haff, MD Inpatient   12/13/2010 0113 12/14/2010 1123 Full Code [id number]  Sid Falcon, RN Inpatient   11/22/2010 0216 11/28/2010 2239 DNR [id number]  Viviano Simas, RN Inpatient    Advance Directive Documentation     Most Recent Value  Type of Advance Directive  Healthcare Power of Attorney, Living will  Pre-existing out of facility DNR order (yellow form or pink MOST form)  -  "MOST" Form in Place?  -      TOTAL TIME TAKING CARE OF THIS PATIENT: *40* minutes.    Fritzi Mandes M.D on 05/07/2017 at 1:17 PM  Between 7am to 6pm - Pager - [phone] After 6pm go to [email or web address] - password [email or web address] [facility]  Office  [phone]  CC: Primary care physician; [name], MD

## 2017-05-07 NOTE — Progress Notes (Signed)
Pre HD Assessment    05/07/17 1200  Neurological  Level of Consciousness Alert  Orientation Level Oriented to person  Respiratory  Respiratory Pattern Regular  Chest Assessment Chest expansion symmetrical  Bilateral Breath Sounds Diminished  Cardiac  Pulse Regular  ECG Monitor Yes  Vascular  R Radial Pulse +2  L Radial Pulse +2  Edema Generalized  Psychosocial  Psychosocial (WDL) WDL

## 2017-05-07 NOTE — Progress Notes (Signed)
HD tx started w/o complication    12/81/18 1200  Vital Signs  Pulse Rate 82  Pulse Rate Source Monitor  Resp (!) 22  BP (!) 168/86  BP Location Right Arm  BP Method Automatic  Patient Position (if appropriate) Lying  Oxygen Therapy  O2 Device Nasal Cannula  O2 Flow Rate (L/min) 2 L/min  Pulse Oximetry Type Continuous  During Hemodialysis Assessment  Blood Flow Rate (mL/min) 400 mL/min  Arterial Pressure (mmHg) -200 mmHg  Venous Pressure (mmHg) 170 mmHg  Transmembrane Pressure (mmHg) 80 mmHg  Ultrafiltration Rate (mL/min) 520 mL/min  Dialysate Flow Rate (mL/min) 600 ml/min  Conductivity: Machine  14.2  HD Safety Checks Performed Yes  Dialysis Fluid Bolus Normal Saline  Bolus Amount (mL) 250 mL  Intra-Hemodialysis Comments Tx initiated (Pt stable, feeling cold )  Fistula / Graft Left Upper arm Arteriovenous fistula  No Placement Date or Time found.   Orientation: Left  Access Location: Upper arm  Access Type: Arteriovenous fistula  Status Accessed  Needle Size 15  Drainage Description None

## 2017-05-07 NOTE — Care Management Important Message (Signed)
Copy of signed IM left in patient's room.    

## 2017-05-07 NOTE — Clinical Social Work Note (Signed)
Patient discharging today to return to [facility]. [facility] at [facility] has obtained auth and has received discharge information. Patient's son was aware yesterday that patient may discharge today. CSW requested that patient's nurse, Terri, contact patient's son to notify him that patient is discharging as planned. Shela Leff MSW,LCSW [phone]

## 2017-05-07 NOTE — Progress Notes (Signed)
Pre HD Tx    05/07/17 1155  Hand-Off documentation  Report given to (Full Name) Beatris Ship, RN   Report received from (Full Name) Lewie Chamber, RN   Vital Signs  Temp 98.1 F (36.7 C)  Temp Source Oral  Pulse Rate 82  Pulse Rate Source Monitor  Resp (!) 21  BP (!) 162/88  BP Location Right Arm  BP Method Automatic  Oxygen Therapy  SpO2 100 %  O2 Device Nasal Cannula  O2 Flow Rate (L/min) 2 L/min  Pain Assessment  Pain Scale 0-10  Pain Score 0  Dialysis Weight  Weight  (unable to weight )  Type of Weight Pre-Dialysis  Time-Out for Hemodialysis  What Procedure? HD   Pt Identifiers(min of two) First/Last Name;MRN/Account#  Correct Site? Yes  Correct Side? Yes  Correct Procedure? Yes  Consents Verified? Yes  Rad Studies Available? [name]  Safety Precautions Reviewed? Yes  [employer] Number [phone]  Station Number 2  UF/Alarm Test Passed  Conductivity: Meter 14  Conductivity: Machine  14  pH 7.2  Reverse Osmosis Main  Normal Saline Lot Number [id number]  Dialyzer Lot Number [id number]  Disposable Set Lot Number [id number]  Machine Temperature 98.6 F (37 C)  Musician and Audible Yes  Blood Lines Intact and Secured Yes  Pre Treatment Patient Checks  Vascular access used during treatment Fistula  Hepatitis B Surface Antigen Results Negative  Date Hepatitis B Surface Antigen Drawn 04/07/17  Hepatitis B Surface Antibody 10 (>10)  Date Hepatitis B Surface Antibody Drawn 04/07/17  Hemodialysis Consent Verified Yes  Hemodialysis Standing Orders Initiated Yes  ECG (Telemetry) Monitor On Yes  Prime Ordered Normal Saline  Length of  DialysisTreatment -hour(s) 3.5 Hour(s)  Dialysis Treatment Comments Na 140  Dialyzer Elisio 17H NR  Dialysate 3K, 2.5 Ca  Dialysis Anticoagulant None  Dialysate Flow Ordered 600  Blood Flow Rate Ordered 400 mL/min  Ultrafiltration Goal 0.5 Liters  Pre Treatment Labs CBC;Renal panel  Fistula / Graft Left Upper arm  Arteriovenous fistula  No Placement Date or Time found.   Orientation: Left  Access Location: Upper arm  Access Type: Arteriovenous fistula  Site Condition No complications  Fistula / Graft Assessment Present;Thrill;Bruit  Status Patent

## 2017-05-07 NOTE — Progress Notes (Signed)
Post HD Tx    05/07/17 1534  Vital Signs  Temp 98.1 F (36.7 C)  Temp Source Oral  Pulse Rate 81  Pulse Rate Source Monitor  Resp 19  BP (!) 185/58  BP Location Right Arm  BP Method Automatic  Patient Position (if appropriate) Lying  Oxygen Therapy  SpO2 100 %  O2 Device Nasal Cannula  O2 Flow Rate (L/min) 2 L/min  Pulse Oximetry Type Continuous  Pain Assessment  Pain Scale 0-10  Pain Score 0  Dialysis Weight  Weight  (unable to obtain )  Type of Weight Post-Dialysis  Post-Hemodialysis Assessment  Rinseback Volume (mL) 250 mL  Dialyzer Clearance Lightly streaked  Duration of HD Treatment -hour(s) 3.5 hour(s)  Hemodialysis Intake (mL) 500 mL  UF Total -Machine (mL) 1205 mL  Net UF (mL) 705 mL  Tolerated HD Treatment Yes  Post-Hemodialysis Comments  (Prolnged bleeding post tx, other wise stable )  AVG/AVF Arterial Site Held (minutes) 25 minutes  AVG/AVF Venous Site Held (minutes) 15 minutes

## 2017-05-07 NOTE — Progress Notes (Signed)
[facility]  ROUNDING NOTE   Subjective:    Patient is doing fair today.  Hemoglobin 10.3 Blood pressure is higher at 132/51 today Potassium is normal at 4.4 Patient is very hard of hearing therefore conversation is limited Denies any acute shortness of breath  Vital signs in last 24 hours:  Temp:  [97.9 F (36.6 C)-98 F (36.7 C)] 98 F (36.7 C) (05/03 0302) Pulse Rate:  [64-73] 64 (05/03 0308) Resp:  [20-24] 20 (05/03 0302) BP: (127-149)/(47-52) 132/51 (05/03 0308) SpO2:  [100 %] 100 % (05/03 0308)  Weight change:  Filed Weights   05/05/17 1308  Weight: 106 lb 3.2 oz (48.2 kg)    Intake/Output: I/O last 3 completed shifts: In: 0272 [P.O.:1117; IV Piggyback:200] Out: 0    Intake/Output this shift:  Total I/O In: 420 [P.O.:420] Out: -   Physical Exam: General: NAD,   Head: Normocephalic, atraumatic.  Dry oral mucosal membranes  Eyes: Anicteric,  Neck: Supple, trachea midline  Lungs:  Clear to auscultation  Heart: Regular rate and rhythm  Abdomen:  Soft, nontender,   Extremities:  no peripheral edema.  Neurologic: alert.  Able to answer few questions  Skin: Scattered eccymosis  Access:  Left upper extremity AVF    Basic Metabolic Panel: Recent Labs  Lab 05/05/17 1312 05/06/17 0426  NA 134* 132*  K 3.8 4.4  CL 94* 93*  CO2 29 30  GLUCOSE 123* 105*  BUN 26* 38*  CREATININE 3.08* 3.89*  CALCIUM 7.8* 7.9*    Liver Function Tests: Recent Labs  Lab 05/05/17 1312  AST 25  ALT 8*  ALKPHOS 141*  BILITOT 1.2  PROT 7.0  ALBUMIN 2.8*   No results for input(s): LIPASE, AMYLASE in the last 168 hours. No results for input(s): AMMONIA in the last 168 hours.  CBC: Recent Labs  Lab 05/05/17 1312 05/06/17 0426 05/07/17 0517  WBC 22.5* 15.5*  --   HGB 7.8* 9.1* 10.3*  HCT 23.6* 27.5*  --   MCV 107.1* 102.5*  --   PLT 189 187  --     Cardiac Enzymes: No results for input(s): CKTOTAL, CKMB, CKMBINDEX, TROPONINI in the last 168  hours.  BNP: Invalid input(s): POCBNP  CBG: Recent Labs  Lab 05/07/17 0046 05/07/17 0400 05/07/17 0801 05/07/17 0846 05/07/17 1133  GLUCAP 137* 151* 28* 100* 182*    Microbiology: Results for orders placed or performed during the hospital encounter of 05/05/17  MRSA PCR Screening     Status: None   Collection Time: 05/06/17  4:27 AM  Result Value Ref Range Status   MRSA by PCR NEGATIVE NEGATIVE Final    Comment:        The GeneXpert MRSA Assay (FDA approved for NASAL specimens only), is one component of a comprehensive MRSA colonization surveillance program. It is not intended to diagnose MRSA infection nor to guide or monitor treatment for MRSA infections. Performed at [facility], [address] [place], [place] [address]     Coagulation Studies: Recent Labs    05/05/17 1312  LABPROT 15.8*  INR 1.27    Urinalysis: No results for input(s): COLORURINE, LABSPEC, PHURINE, GLUCOSEU, HGBUR, BILIRUBINUR, KETONESUR, PROTEINUR, UROBILINOGEN, NITRITE, LEUKOCYTESUR in the last 72 hours.  Invalid input(s): APPERANCEUR    Imaging: Ct Abdomen Pelvis Wo Contrast  Result Date: 05/05/2017 CLINICAL DATA:  Hypotension. EXAM: CT ABDOMEN AND PELVIS WITHOUT CONTRAST TECHNIQUE: Multidetector CT imaging of the abdomen and pelvis was performed following the standard protocol without IV  contrast. COMPARISON:  06/09/2010 FINDINGS: Lower chest: Heart size upper normal. Status post median sternotomy. Coronary artery calcification is evident. Small to moderate right pleural effusion with tiny left pleural effusion, incompletely visualized. 7 mm posterior left lower lobe pulmonary nodule evident. Hepatobiliary: Subtle nodularity of liver contour suggests cirrhosis. Gallbladder is distended with 17 mm calcified gallstone evident. No intrahepatic or extrahepatic biliary dilation. Pancreas: Pancreas is diffusely atrophic. Spleen: No splenomegaly. No focal mass lesion. Adrenals/Urinary  Tract: Left adrenal thickening without a discrete nodule or mass evident. Right adrenal gland unremarkable. Native kidneys are atrophic bilaterally. Bladder is decompressed. Stomach/Bowel: Stomach is decompressed. Duodenum is normally positioned as is the ligament of Treitz. No small bowel wall thickening. No small bowel dilatation. The terminal ileum is normal. The appendix is normal. The diverticular changes are noted in the left colon and there is mild associated relatively diffuse wall thickening in the sigmoid colon and rectum. Vascular/Lymphatic: There is abdominal aortic atherosclerosis without aneurysm. There is no gastrohepatic or hepatoduodenal ligament lymphadenopathy. No intraperitoneal or retroperitoneal lymphadenopathy. No pelvic sidewall lymphadenopathy. Reproductive: Uterus surgically absent.  There is no adnexal mass. Other: No intraperitoneal free fluid. Diffuse body wall edema evident. Musculoskeletal: Nondisplaced fracture identified in the right inferior pubic ramus (image 75/series 2) without evidence of bony callus. Another for nondisplaced fracture is identified in the posterior right superior pubic ramus near the acetabulum, best visualized on coronal image 25/series 4. bones are diffusely demineralized. Lumbar scoliosis evident. IMPRESSION: 1. Diverticular changes with mild circumferential wall thickening in the sigmoid colon and rectum. No gross rectal mass evident. 2. Nondisplaced acute fractures of the right inferior pubic ramus and posterior aspect of the right superior pubic ramus. 3. Subtle nodularity of liver contour suggests cirrhosis. 4.  Aortic Atherosclerois (ICD10-170.0) 5. Diffuse body wall edema. 6. Cholelithiasis. Electronically Signed   By: Misty Stanley M.D.   On: 05/05/2017 15:08     Medications:    . amoxicillin-clavulanate  1 tablet Oral Q24H  . feeding supplement  1 Container Oral TID BM  . feeding supplement (ENSURE ENLIVE)  237 mL Oral TID BM  . folic acid   0.5 mg Oral Daily  . hydrALAZINE  25 mg Oral Q8H  . insulin aspart  0-9 Units Subcutaneous Q4H  . levothyroxine  75 mcg Oral QAC breakfast  . mouth rinse  15 mL Mouth Rinse BID  . metoprolol tartrate  50 mg Oral BID  . multivitamin  1 tablet Oral QHS  . vitamin C  250 mg Oral BID     Assessment/ Plan:  [name] is a 84 y.o. white female with end stage renal disease on hemodialysis, coronary artery disease, history of bilateral knee replacement, aortic valve replacement 2009, diabetes, hypertension, hypothyroidism, peripheral vascular disease, pulmonary hypertension, anxiety  [facility]/MWF  1.  ESRD 2.  Anemia of CKD and GI bleed  Routine HD today EPO With HD Monitor phos level     LOS: 2 Jaymz Traywick Candiss Norse 5/3/201912:46 PM

## 2017-05-07 NOTE — Progress Notes (Signed)
Post HD assessment, pt was more talkative post tx, no other change from initial assessment.    05/07/17 1535  Neurological  Level of Consciousness Alert  Orientation Level Oriented to person  Respiratory  Respiratory Pattern Regular  Chest Assessment Chest expansion symmetrical  Bilateral Breath Sounds Diminished  Cardiac  Pulse Regular  ECG Monitor Yes  Vascular  R Radial Pulse +2  L Radial Pulse +2  Edema Generalized  Psychosocial  Psychosocial (WDL) WDL

## 2017-05-08 LAB — BPAM RBC
Blood Product Expiration Date: 201905092359
Blood Product Expiration Date: 201905092359
ISSUE DATE / TIME: 201905011454
Unit Type and Rh: 5100
Unit Type and Rh: 5100

## 2017-05-08 LAB — TYPE AND SCREEN
ABO/RH(D): O POS
ANTIBODY SCREEN: NEGATIVE
UNIT DIVISION: 0
UNIT DIVISION: 0

## 2017-05-08 LAB — PREPARE RBC (CROSSMATCH)

## 2017-05-10 ENCOUNTER — Telehealth: Payer: Self-pay | Admitting: *Deleted

## 2017-05-10 DIAGNOSIS — N2581 Secondary hyperparathyroidism of renal origin: Secondary | ICD-10-CM | POA: Diagnosis not present

## 2017-05-10 DIAGNOSIS — S329XXD Fracture of unspecified parts of lumbosacral spine and pelvis, subsequent encounter for fracture with routine healing: Secondary | ICD-10-CM | POA: Diagnosis not present

## 2017-05-10 DIAGNOSIS — E039 Hypothyroidism, unspecified: Secondary | ICD-10-CM | POA: Diagnosis not present

## 2017-05-10 DIAGNOSIS — D631 Anemia in chronic kidney disease: Secondary | ICD-10-CM | POA: Diagnosis not present

## 2017-05-10 DIAGNOSIS — I10 Essential (primary) hypertension: Secondary | ICD-10-CM | POA: Diagnosis not present

## 2017-05-10 DIAGNOSIS — N186 End stage renal disease: Secondary | ICD-10-CM | POA: Diagnosis not present

## 2017-05-10 DIAGNOSIS — E119 Type 2 diabetes mellitus without complications: Secondary | ICD-10-CM | POA: Diagnosis not present

## 2017-05-10 NOTE — Telephone Encounter (Signed)
Pt is not TCM eligible as she was transferred to alternate facility. Lm on pts vm requesting a cb to schedule hosp f/u if needed

## 2017-05-12 DIAGNOSIS — D631 Anemia in chronic kidney disease: Secondary | ICD-10-CM | POA: Diagnosis not present

## 2017-05-12 DIAGNOSIS — N186 End stage renal disease: Secondary | ICD-10-CM | POA: Diagnosis not present

## 2017-05-12 DIAGNOSIS — N2581 Secondary hyperparathyroidism of renal origin: Secondary | ICD-10-CM | POA: Diagnosis not present

## 2017-05-14 DIAGNOSIS — N2581 Secondary hyperparathyroidism of renal origin: Secondary | ICD-10-CM | POA: Diagnosis not present

## 2017-05-14 DIAGNOSIS — N186 End stage renal disease: Secondary | ICD-10-CM | POA: Diagnosis not present

## 2017-05-14 DIAGNOSIS — D631 Anemia in chronic kidney disease: Secondary | ICD-10-CM | POA: Diagnosis not present

## 2017-05-17 ENCOUNTER — Encounter: Payer: Self-pay | Admitting: Emergency Medicine

## 2017-05-17 ENCOUNTER — Other Ambulatory Visit: Payer: Self-pay

## 2017-05-17 ENCOUNTER — Inpatient Hospital Stay
Admission: EM | Admit: 2017-05-17 | Discharge: 2017-05-26 | DRG: 393 | Disposition: A | Discharge status: Skilled Nursing Facility | Payer: Medicare Other | Attending: Internal Medicine | Admitting: Internal Medicine

## 2017-05-17 ENCOUNTER — Emergency Department: Payer: Medicare Other

## 2017-05-17 DIAGNOSIS — H9190 Unspecified hearing loss, unspecified ear: Secondary | ICD-10-CM | POA: Diagnosis present

## 2017-05-17 DIAGNOSIS — E1122 Type 2 diabetes mellitus with diabetic chronic kidney disease: Secondary | ICD-10-CM | POA: Diagnosis not present

## 2017-05-17 DIAGNOSIS — R1312 Dysphagia, oropharyngeal phase: Secondary | ICD-10-CM | POA: Diagnosis not present

## 2017-05-17 DIAGNOSIS — I959 Hypotension, unspecified: Secondary | ICD-10-CM | POA: Diagnosis not present

## 2017-05-17 DIAGNOSIS — D631 Anemia in chronic kidney disease: Secondary | ICD-10-CM | POA: Diagnosis not present

## 2017-05-17 DIAGNOSIS — Z79890 Hormone replacement therapy (postmenopausal): Secondary | ICD-10-CM

## 2017-05-17 DIAGNOSIS — K51211 Ulcerative (chronic) proctitis with rectal bleeding: Secondary | ICD-10-CM | POA: Diagnosis not present

## 2017-05-17 DIAGNOSIS — A0472 Enterocolitis due to Clostridium difficile, not specified as recurrent: Secondary | ICD-10-CM | POA: Diagnosis present

## 2017-05-17 DIAGNOSIS — D128 Benign neoplasm of rectum: Secondary | ICD-10-CM | POA: Diagnosis not present

## 2017-05-17 DIAGNOSIS — E871 Hypo-osmolality and hyponatremia: Secondary | ICD-10-CM | POA: Diagnosis present

## 2017-05-17 DIAGNOSIS — D123 Benign neoplasm of transverse colon: Secondary | ICD-10-CM | POA: Diagnosis present

## 2017-05-17 DIAGNOSIS — I4891 Unspecified atrial fibrillation: Secondary | ICD-10-CM | POA: Diagnosis present

## 2017-05-17 DIAGNOSIS — E039 Hypothyroidism, unspecified: Secondary | ICD-10-CM | POA: Diagnosis not present

## 2017-05-17 DIAGNOSIS — E785 Hyperlipidemia, unspecified: Secondary | ICD-10-CM | POA: Diagnosis not present

## 2017-05-17 DIAGNOSIS — Z953 Presence of xenogenic heart valve: Secondary | ICD-10-CM

## 2017-05-17 DIAGNOSIS — I509 Heart failure, unspecified: Secondary | ICD-10-CM | POA: Diagnosis not present

## 2017-05-17 DIAGNOSIS — Y95 Nosocomial condition: Secondary | ICD-10-CM | POA: Diagnosis present

## 2017-05-17 DIAGNOSIS — N2581 Secondary hyperparathyroidism of renal origin: Secondary | ICD-10-CM | POA: Diagnosis present

## 2017-05-17 DIAGNOSIS — N186 End stage renal disease: Secondary | ICD-10-CM | POA: Diagnosis not present

## 2017-05-17 DIAGNOSIS — L8990 Pressure ulcer of unspecified site, unspecified stage: Secondary | ICD-10-CM

## 2017-05-17 DIAGNOSIS — E1151 Type 2 diabetes mellitus with diabetic peripheral angiopathy without gangrene: Secondary | ICD-10-CM | POA: Diagnosis present

## 2017-05-17 DIAGNOSIS — D62 Acute posthemorrhagic anemia: Secondary | ICD-10-CM | POA: Diagnosis present

## 2017-05-17 DIAGNOSIS — I2510 Atherosclerotic heart disease of native coronary artery without angina pectoris: Secondary | ICD-10-CM | POA: Diagnosis not present

## 2017-05-17 DIAGNOSIS — S329XXD Fracture of unspecified parts of lumbosacral spine and pelvis, subsequent encounter for fracture with routine healing: Secondary | ICD-10-CM | POA: Diagnosis not present

## 2017-05-17 DIAGNOSIS — Z7982 Long term (current) use of aspirin: Secondary | ICD-10-CM | POA: Diagnosis not present

## 2017-05-17 DIAGNOSIS — K5730 Diverticulosis of large intestine without perforation or abscess without bleeding: Secondary | ICD-10-CM | POA: Diagnosis not present

## 2017-05-17 DIAGNOSIS — K640 First degree hemorrhoids: Secondary | ICD-10-CM | POA: Diagnosis present

## 2017-05-17 DIAGNOSIS — Z951 Presence of aortocoronary bypass graft: Secondary | ICD-10-CM

## 2017-05-17 DIAGNOSIS — E875 Hyperkalemia: Secondary | ICD-10-CM | POA: Diagnosis present

## 2017-05-17 DIAGNOSIS — R197 Diarrhea, unspecified: Secondary | ICD-10-CM | POA: Diagnosis not present

## 2017-05-17 DIAGNOSIS — I272 Pulmonary hypertension, unspecified: Secondary | ICD-10-CM | POA: Diagnosis present

## 2017-05-17 DIAGNOSIS — K921 Melena: Secondary | ICD-10-CM | POA: Diagnosis not present

## 2017-05-17 DIAGNOSIS — I350 Nonrheumatic aortic (valve) stenosis: Secondary | ICD-10-CM | POA: Diagnosis present

## 2017-05-17 DIAGNOSIS — K922 Gastrointestinal hemorrhage, unspecified: Secondary | ICD-10-CM | POA: Diagnosis not present

## 2017-05-17 DIAGNOSIS — R05 Cough: Secondary | ICD-10-CM | POA: Diagnosis not present

## 2017-05-17 DIAGNOSIS — I10 Essential (primary) hypertension: Secondary | ICD-10-CM | POA: Diagnosis not present

## 2017-05-17 DIAGNOSIS — I120 Hypertensive chronic kidney disease with stage 5 chronic kidney disease or end stage renal disease: Secondary | ICD-10-CM | POA: Diagnosis not present

## 2017-05-17 DIAGNOSIS — I5032 Chronic diastolic (congestive) heart failure: Secondary | ICD-10-CM | POA: Diagnosis present

## 2017-05-17 DIAGNOSIS — Z7401 Bed confinement status: Secondary | ICD-10-CM | POA: Diagnosis not present

## 2017-05-17 DIAGNOSIS — K219 Gastro-esophageal reflux disease without esophagitis: Secondary | ICD-10-CM | POA: Diagnosis present

## 2017-05-17 DIAGNOSIS — Z87891 Personal history of nicotine dependence: Secondary | ICD-10-CM | POA: Diagnosis not present

## 2017-05-17 DIAGNOSIS — K449 Diaphragmatic hernia without obstruction or gangrene: Secondary | ICD-10-CM | POA: Diagnosis present

## 2017-05-17 DIAGNOSIS — Z992 Dependence on renal dialysis: Secondary | ICD-10-CM | POA: Diagnosis not present

## 2017-05-17 DIAGNOSIS — K5713 Diverticulitis of small intestine without perforation or abscess with bleeding: Secondary | ICD-10-CM | POA: Diagnosis not present

## 2017-05-17 DIAGNOSIS — L89899 Pressure ulcer of other site, unspecified stage: Secondary | ICD-10-CM | POA: Diagnosis not present

## 2017-05-17 DIAGNOSIS — J181 Lobar pneumonia, unspecified organism: Secondary | ICD-10-CM | POA: Diagnosis not present

## 2017-05-17 DIAGNOSIS — J189 Pneumonia, unspecified organism: Secondary | ICD-10-CM

## 2017-05-17 DIAGNOSIS — Z7902 Long term (current) use of antithrombotics/antiplatelets: Secondary | ICD-10-CM | POA: Diagnosis not present

## 2017-05-17 DIAGNOSIS — Z96653 Presence of artificial knee joint, bilateral: Secondary | ICD-10-CM | POA: Diagnosis present

## 2017-05-17 DIAGNOSIS — R059 Cough, unspecified: Secondary | ICD-10-CM

## 2017-05-17 DIAGNOSIS — K5710 Diverticulosis of small intestine without perforation or abscess without bleeding: Secondary | ICD-10-CM | POA: Diagnosis not present

## 2017-05-17 DIAGNOSIS — K626 Ulcer of anus and rectum: Principal | ICD-10-CM | POA: Diagnosis present

## 2017-05-17 DIAGNOSIS — Z90710 Acquired absence of both cervix and uterus: Secondary | ICD-10-CM

## 2017-05-17 DIAGNOSIS — I132 Hypertensive heart and chronic kidney disease with heart failure and with stage 5 chronic kidney disease, or end stage renal disease: Secondary | ICD-10-CM | POA: Diagnosis not present

## 2017-05-17 DIAGNOSIS — K625 Hemorrhage of anus and rectum: Secondary | ICD-10-CM | POA: Diagnosis not present

## 2017-05-17 DIAGNOSIS — E569 Vitamin deficiency, unspecified: Secondary | ICD-10-CM | POA: Diagnosis not present

## 2017-05-17 DIAGNOSIS — Z66 Do not resuscitate: Secondary | ICD-10-CM | POA: Diagnosis present

## 2017-05-17 DIAGNOSIS — Z79899 Other long term (current) drug therapy: Secondary | ICD-10-CM

## 2017-05-17 DIAGNOSIS — M6281 Muscle weakness (generalized): Secondary | ICD-10-CM | POA: Diagnosis not present

## 2017-05-17 DIAGNOSIS — L89151 Pressure ulcer of sacral region, stage 1: Secondary | ICD-10-CM | POA: Diagnosis present

## 2017-05-17 LAB — COMPREHENSIVE METABOLIC PANEL
ALBUMIN: 2.4 g/dL — AB (ref 3.5–5.0)
ALT: 9 U/L — AB (ref 14–54)
AST: 25 U/L (ref 15–41)
Alkaline Phosphatase: 143 U/L — ABNORMAL HIGH (ref 38–126)
Anion gap: 8 (ref 5–15)
BILIRUBIN TOTAL: 0.7 mg/dL (ref 0.3–1.2)
BUN: 55 mg/dL — ABNORMAL HIGH (ref 6–20)
CALCIUM: 8.1 mg/dL — AB (ref 8.9–10.3)
CO2: 28 mmol/L (ref 22–32)
CREATININE: 4.87 mg/dL — AB (ref 0.44–1.00)
Chloride: 94 mmol/L — ABNORMAL LOW (ref 101–111)
GFR calc Af Amer: 8 mL/min — ABNORMAL LOW (ref >60)
GFR calc non Af Amer: 7 mL/min — ABNORMAL LOW (ref >60)
GLUCOSE: 163 mg/dL — AB (ref 65–99)
Potassium: 4.9 mmol/L (ref 3.5–5.1)
Sodium: 130 mmol/L — ABNORMAL LOW (ref 135–145)
TOTAL PROTEIN: 6 g/dL — AB (ref 6.5–8.1)

## 2017-05-17 LAB — C DIFFICILE QUICK SCREEN W PCR REFLEX
C DIFFICILE (CDIFF) INTERP: DETECTED
C DIFFICILE (CDIFF) TOXIN: POSITIVE — AB
C DIFFICLE (CDIFF) ANTIGEN: POSITIVE — AB

## 2017-05-17 LAB — CBC
HCT: 27.3 % — ABNORMAL LOW (ref 35.0–47.0)
HEMOGLOBIN: 9 g/dL — AB (ref 12.0–16.0)
MCH: 33.5 pg (ref 26.0–34.0)
MCHC: 33 g/dL (ref 32.0–36.0)
MCV: 101.7 fL — ABNORMAL HIGH (ref 80.0–100.0)
Platelets: 271 10*3/uL (ref 150–440)
RBC: 2.69 MIL/uL — AB (ref 3.80–5.20)
RDW: 18.3 % — ABNORMAL HIGH (ref 11.5–14.5)
WBC: 12.6 10*3/uL — ABNORMAL HIGH (ref 3.6–11.0)

## 2017-05-17 LAB — TROPONIN I: Troponin I: 0.04 ng/mL (ref <0.03)

## 2017-05-17 LAB — HEMOGLOBIN
Hemoglobin: 7.4 g/dL — ABNORMAL LOW (ref 12.0–16.0)
Hemoglobin: 8.7 g/dL — ABNORMAL LOW (ref 12.0–16.0)

## 2017-05-17 LAB — PREPARE RBC (CROSSMATCH)

## 2017-05-17 LAB — GLUCOSE, CAPILLARY
GLUCOSE-CAPILLARY: 108 mg/dL — AB (ref 65–99)
Glucose-Capillary: 110 mg/dL — ABNORMAL HIGH (ref 65–99)

## 2017-05-17 LAB — PROTIME-INR
INR: 1.13
Prothrombin Time: 14.4 seconds (ref 11.4–15.2)

## 2017-05-17 LAB — APTT: aPTT: 34 seconds (ref 24–36)

## 2017-05-17 MED ORDER — ACETAMINOPHEN 650 MG RE SUPP: 650.0000 mg | Freq: Four times a day (QID) | RECTAL | Status: DC | PRN

## 2017-05-17 MED ORDER — ONDANSETRON HCL 4 MG/2ML IJ SOLN
4.0000 mg | Freq: Four times a day (QID) | INTRAMUSCULAR | Status: DC | PRN
  Administered 2017-05-17 – 2017-05-24 (×3): 4 mg via INTRAVENOUS
  Filled 2017-05-17 (×3): qty 2

## 2017-05-17 MED ORDER — ASPIRIN EC 81 MG PO TBEC
81.0000 mg | DELAYED_RELEASE_TABLET | Freq: Every day | ORAL | Status: DC
  Administered 2017-05-17: 81 mg via ORAL
  Filled 2017-05-17: qty 1

## 2017-05-17 MED ORDER — POLYETHYLENE GLYCOL 3350 17 G PO PACK: 17.0000 g | PACK | Freq: Every day | ORAL | Status: DC | PRN

## 2017-05-17 MED ORDER — HYDROCODONE-ACETAMINOPHEN 5-325 MG PO TABS
1.0000 | ORAL_TABLET | ORAL | Status: DC | PRN
  Administered 2017-05-20 (×2): 2 via ORAL
  Administered 2017-05-22 – 2017-05-23 (×2): 1 via ORAL
  Administered 2017-05-23: 2 via ORAL
  Administered 2017-05-23 – 2017-05-25 (×2): 1 via ORAL
  Filled 2017-05-17 (×3): qty 1
  Filled 2017-05-17: qty 2
  Filled 2017-05-17: qty 1
  Filled 2017-05-17 (×2): qty 2

## 2017-05-17 MED ORDER — AMLODIPINE BESYLATE 5 MG PO TABS
5.0000 mg | ORAL_TABLET | Freq: Every day | ORAL | Status: DC
  Administered 2017-05-18: 5 mg via ORAL
  Filled 2017-05-17: qty 1

## 2017-05-17 MED ORDER — ACETAMINOPHEN 325 MG PO TABS: 325.0000 mg | ORAL_TABLET | Freq: Four times a day (QID) | ORAL | Status: DC | PRN

## 2017-05-17 MED ORDER — ONDANSETRON HCL 4 MG PO TABS: 4.0000 mg | ORAL_TABLET | Freq: Four times a day (QID) | ORAL | Status: DC | PRN

## 2017-05-17 MED ORDER — SODIUM CHLORIDE 0.9 % IV SOLN
1.0000 g | Freq: Once | INTRAVENOUS | Status: AC
  Administered 2017-05-17: 1 g via INTRAVENOUS
  Filled 2017-05-17: qty 1

## 2017-05-17 MED ORDER — SODIUM CHLORIDE 0.9 % IV SOLN
10.0000 mL/h | Freq: Once | INTRAVENOUS | Status: AC
Start: 2017-05-17 — End: 2017-05-17
  Administered 2017-05-17: 10 mL/h via INTRAVENOUS

## 2017-05-17 MED ORDER — CALCIUM CARBONATE ANTACID 500 MG PO CHEW
1.0000 | CHEWABLE_TABLET | Freq: Three times a day (TID) | ORAL | Status: DC
  Administered 2017-05-18 – 2017-05-26 (×20): 200 mg via ORAL
  Filled 2017-05-17 (×21): qty 1

## 2017-05-17 MED ORDER — PRAVASTATIN SODIUM 20 MG PO TABS
40.0000 mg | ORAL_TABLET | Freq: Every evening | ORAL | Status: DC
  Administered 2017-05-18 – 2017-05-25 (×7): 40 mg via ORAL
  Filled 2017-05-17: qty 1
  Filled 2017-05-17 (×7): qty 2

## 2017-05-17 MED ORDER — METOPROLOL TARTRATE 50 MG PO TABS
50.0000 mg | ORAL_TABLET | Freq: Two times a day (BID) | ORAL | Status: DC
  Administered 2017-05-17 – 2017-05-18 (×2): 50 mg via ORAL
  Filled 2017-05-17 (×2): qty 1

## 2017-05-17 MED ORDER — SODIUM CHLORIDE 0.9 % IV SOLN: INTRAVENOUS | Status: DC

## 2017-05-17 MED ORDER — ENSURE ENLIVE PO LIQD
237.0000 mL | Freq: Three times a day (TID) | ORAL | Status: DC
  Administered 2017-05-17 – 2017-05-26 (×20): 237 mL via ORAL

## 2017-05-17 MED ORDER — PIPERACILLIN-TAZOBACTAM 3.375 G IVPB
3.3750 g | Freq: Two times a day (BID) | INTRAVENOUS | Status: DC
  Administered 2017-05-17: 3.375 g via INTRAVENOUS
  Filled 2017-05-17: qty 50

## 2017-05-17 MED ORDER — POLYVINYL ALCOHOL 1.4 % OP SOLN
1.0000 [drp] | OPHTHALMIC | Status: DC | PRN
  Administered 2017-05-18 – 2017-05-25 (×8): 1 [drp] via OPHTHALMIC
  Filled 2017-05-17 (×2): qty 15

## 2017-05-17 MED ORDER — ALBUTEROL SULFATE (2.5 MG/3ML) 0.083% IN NEBU: 2.5000 mg | INHALATION_SOLUTION | RESPIRATORY_TRACT | Status: DC | PRN

## 2017-05-17 MED ORDER — ACETAMINOPHEN 325 MG PO TABS: 650.0000 mg | ORAL_TABLET | Freq: Four times a day (QID) | ORAL | Status: DC | PRN

## 2017-05-17 MED ORDER — VITAMIN C 500 MG PO TABS
500.0000 mg | ORAL_TABLET | Freq: Every day | ORAL | Status: DC
  Filled 2017-05-17: qty 1

## 2017-05-17 MED ORDER — FOLIC ACID 1 MG PO TABS
0.5000 mg | ORAL_TABLET | Freq: Every day | ORAL | Status: DC
  Administered 2017-05-18 – 2017-05-26 (×8): 0.5 mg via ORAL
  Filled 2017-05-17 (×10): qty 1

## 2017-05-17 MED ORDER — VANCOMYCIN 50 MG/ML ORAL SOLUTION
500.0000 mg | Freq: Four times a day (QID) | ORAL | Status: DC
  Administered 2017-05-18 – 2017-05-26 (×30): 500 mg via ORAL
  Filled 2017-05-17 (×37): qty 10

## 2017-05-17 MED ORDER — GANCICLOVIR 0.15 % OP GEL
1.0000 [drp] | Freq: Four times a day (QID) | OPHTHALMIC | Status: DC
  Administered 2017-05-17 – 2017-05-26 (×28): 1 [drp] via OPHTHALMIC
  Filled 2017-05-17 (×2): qty 5

## 2017-05-17 MED ORDER — INSULIN ASPART 100 UNIT/ML ~~LOC~~ SOLN
0.0000 [IU] | Freq: Three times a day (TID) | SUBCUTANEOUS | Status: DC
  Administered 2017-05-18 – 2017-05-20 (×6): 1 [IU] via SUBCUTANEOUS
  Administered 2017-05-21 – 2017-05-22 (×2): 2 [IU] via SUBCUTANEOUS
  Administered 2017-05-22: 1 [IU] via SUBCUTANEOUS
  Administered 2017-05-23 – 2017-05-24 (×3): 2 [IU] via SUBCUTANEOUS
  Administered 2017-05-24: 1 [IU] via SUBCUTANEOUS
  Administered 2017-05-25: 2 [IU] via SUBCUTANEOUS
  Administered 2017-05-25 (×2): 1 [IU] via SUBCUTANEOUS
  Filled 2017-05-17 (×17): qty 1

## 2017-05-17 MED ORDER — SENNOSIDES-DOCUSATE SODIUM 8.6-50 MG PO TABS
1.0000 | ORAL_TABLET | Freq: Every evening | ORAL | Status: DC | PRN
Start: 2017-05-17 — End: 2017-05-26

## 2017-05-17 MED ORDER — LEVOTHYROXINE SODIUM 50 MCG PO TABS
75.0000 ug | ORAL_TABLET | ORAL | Status: DC
  Administered 2017-05-18 – 2017-05-26 (×9): 75 ug via ORAL
  Filled 2017-05-17 (×9): qty 2

## 2017-05-17 MED ORDER — RENA-VITE PO TABS
1.0000 | ORAL_TABLET | Freq: Every day | ORAL | Status: DC
  Administered 2017-05-17 – 2017-05-25 (×10): 1 via ORAL
  Filled 2017-05-17 (×11): qty 1

## 2017-05-17 MED ORDER — METRONIDAZOLE IN NACL 5-0.79 MG/ML-% IV SOLN
500.0000 mg | Freq: Three times a day (TID) | INTRAVENOUS | Status: DC
  Administered 2017-05-17 – 2017-05-23 (×16): 500 mg via INTRAVENOUS
  Filled 2017-05-17 (×19): qty 100

## 2017-05-17 NOTE — ED Triage Notes (Signed)
Pt to ED via EMS from [facility] c/o rectal bleeding that started today, EMS states sheets at facility had large amounts bright red blood.  Pt A&Ox4, skin cool and dry, respirations even and unlabored.

## 2017-05-17 NOTE — Progress Notes (Signed)
Pharmacy Antibiotic Note  [name] is a 84 y.o. female admitted on 05/17/2017 with pneumonia.  Pharmacy has been consulted for Zosyn dosing.  Hemodialysis patient. Recent hospitalization due to diverticular bleed who comes from [facility].   Plan: Patient received Cefepime 1 gram IV x 1 in ER.  -Zosyn 3.375gm IV q12h for Hemodialysis dosing  Height: 5\' 3"  (160 cm) Weight: 100 lb (45.4 kg) IBW/kg (Calculated) : 52.4  Temp (24hrs), Avg:98 F (36.7 C), Min:98 F (36.7 C), Max:98 F (36.7 C)  Recent Labs  Lab 05/17/17 1015  WBC 12.6*  CREATININE 4.87*    Estimated Creatinine Clearance: 5 mL/min (A) (by C-G formula based on SCr of 4.87 mg/dL (H)).    Allergies  Allergen Reactions  . Nitrofurantoin Itching  . Captopril Other (See Comments)    REACTION: unspecified  . Enalapril Maleate Cough  . Ramipril Other (See Comments)    REACTION: unspecified  . Sulfa Antibiotics Other (See Comments)    Reaction unknown  . Verapamil Other (See Comments)    REACTION: unspecified    Antimicrobials this admission: Cefepime x 1 in ER   >>   Zosyn 5/13 >>    Dose adjustments this admission:    Microbiology results: 5/13 BCx: pending   UCx:      Sputum:     previous hospital vivist: 05/06/17 MRSA PCR: negative  Thank you for allowing pharmacy to be a part of this patient's care.  Josslyn Ciolek A 05/17/2017 12:37 PM

## 2017-05-17 NOTE — H&P (Signed)
[place] [facility] [place] NAME: [name]    MR#:  [id number]  DATE OF BIRTH:  October 16, 1920  DATE OF ADMISSION:  05/17/2017  PRIMARY CARE PHYSICIAN: [name], MD   REQUESTING/REFERRING PHYSICIAN: Dr. Sela Hilding  CHIEF COMPLAINT:   Rectal bleeding HISTORY OF PRESENT ILLNESS:  [name]  is a 84 y.o. female with a known history of end-stage renal disease on hemodialysis, diabetes and recent hospitalization due to diverticular bleed who comes from [facility] due to bright red blood per rectum.   According to EMS her bed sheets were covered in bright red blood. Patient is a poor historian.  Her hemoglobin is 9.  She was discharged with a hemoglobin of 10.  She does deny dizziness or lightheadedness. PAST MEDICAL HISTORY:   Past Medical History:  Diagnosis Date  . Anemia    NOS  . Anxiety   . Aortic stenosis   . Cancer [facility])    Mole on top of head to be removed in July 2013  . Chronic diastolic CHF (congestive heart failure) ([place])    a. 01/2017 Echo: EF 60-65%, no rwma, Gr2 DD, mild to mod MR, mildly dil LA/RA, nl RV fxn, Sev TR, PASP 7mmHg.  . Constipation   . Coronary artery disease    a. 2009 CABG x 3 w/ bioprosthetic AVR.  . Diabetes mellitus    type II;was on Glipizide but has been off x 11mon  . Dialysis patient ([place])   . Diarrhea   . ESRD (end stage renal disease) ([place])   . GERD (gastroesophageal reflux disease)   . History of blood transfusion   . Hyperlipidemia    takes Simvastatin nightly  . Hypertension    takes Metoprolol daily  . Hypothyroidism    takes Synthroid daily  . Migraine    hx of  . Oligouria   . Osteoarthritis   . Osteopenia   . Peripheral vascular disease ([place])   . Personal history of colonic polyps   . Pulmonary hypertension ([place])   . Urinary incontinence   . UTI (urinary tract infection)     PAST SURGICAL HISTORY:   Past Surgical History:  Procedure Laterality Date  . A/V FISTULAGRAM [name]  01/07/2017   Procedure: A/V Fistulagram;  Surgeon: Algernon Huxley, MD;  Location: [facility];  Service: Cardiovascular;  Laterality: [name];  . A/V SHUNT INTERVENTION [name] 01/07/2017   Procedure: A/V SHUNT INTERVENTION;  Surgeon: Algernon Huxley, MD;  Location: [facility];  Service: Cardiovascular;  Laterality: [name];  . ABDOMINAL HYSTERECTOMY    . adenosine myoview  2007   benign, EF 69%  . AMPUTATION  06/18/2011   Procedure: AMPUTATION DIGIT;  Surgeon: Newt Minion, MD;  Location: [place];  Service: Orthopedics;  Laterality: Left;  Left 2nd Toe Amputation at MTP Joint  . Amputation-left great toe  7/12   Dr Marlou Sa  . AORTIC VALVE REPLACEMENT  2009  . APPENDECTOMY    . AV FISTULA PLACEMENT  12/22/2010   Procedure: ARTERIOVENOUS (AV) FISTULA CREATION;  Surgeon: Hinda Lenis, MD;  Location: [facility];  Service: Vascular;  Laterality: Left;  Creation of Left Brachial-Cephalic Fistula  . CARDIAC CATHETERIZATION  2000   cad  . CAROTID ENDARTERECTOMY  2011   Right  . CATARACT EXTRACTION    . CORONARY ARTERY BYPASS GRAFT     od  . CORONARY ARTERY BYPASS GRAFT  2009  . EYE SURGERY  BIlateral  . INSERTION OF DIALYSIS CATHETER  12/18/2010   Procedure: INSERTION OF DIALYSIS CATHETER;  Surgeon: Mal Misty, MD;  Location: [facility];  Service: Vascular;  Laterality: Right;  . REPLACEMENT TOTAL KNEE BILATERAL  05/1998  . TONSILLECTOMY    . Traumatic Amputation of Right DIP joint of Index Finger      SOCIAL HISTORY:   Social History   Tobacco Use  . Smoking status: Former Smoker    Years: 15.00    Types: Cigarettes    Last attempt to quit: 01/06/1943    Years since quitting: 74.4  . Smokeless tobacco: Never Used  Substance Use Topics  . Alcohol use: Yes    Comment: Wine occasionally    FAMILY HISTORY:   Family History  Family history unknown: Yes    DRUG ALLERGIES:   Allergies  Allergen Reactions  . Nitrofurantoin Itching  . Captopril Other (See Comments)     REACTION: unspecified  . Enalapril Maleate Cough  . Ramipril Other (See Comments)    REACTION: unspecified  . Sulfa Antibiotics Other (See Comments)    Reaction unknown  . Verapamil Other (See Comments)    REACTION: unspecified    REVIEW OF SYSTEMS:   Review of Systems  Constitutional: Positive for malaise/fatigue and weight loss. Negative for chills and fever.  HENT: Negative.  Negative for ear discharge, ear pain, hearing loss, nosebleeds and sore throat.   Eyes: Negative.  Negative for blurred vision and pain.  Respiratory: Negative.  Negative for cough, hemoptysis, shortness of breath and wheezing.   Cardiovascular: Negative.  Negative for chest pain, palpitations and leg swelling.  Gastrointestinal: Positive for blood in stool. Negative for abdominal pain, diarrhea, nausea and vomiting.  Genitourinary: Negative.  Negative for dysuria.  Musculoskeletal: Negative.  Negative for back pain.  Skin: Negative.   Neurological: Negative for dizziness, tremors, speech change, focal weakness, seizures and headaches.  Endo/Heme/Allergies: Negative.  Does not bruise/bleed easily.  Psychiatric/Behavioral: Positive for memory loss. Negative for depression, hallucinations and suicidal ideas.    MEDICATIONS AT HOME:   Prior to Admission medications   Medication Sig Start Date End Date Taking? Authorizing Provider  acetaminophen (TYLENOL) 325 MG tablet Take 1 tablet (325 mg total) by mouth every 6 (six) hours as needed for mild pain or fever (or Fever >/= 101). 04/28/17   Gouru, Aruna, MD  albuterol (PROVENTIL) (2.5 MG/3ML) 0.083% nebulizer solution Take 3 mLs (2.5 mg total) by nebulization every 2 (two) hours as needed for wheezing. 04/28/17   Nicholes Mango, MD  amLODipine (NORVASC) 5 MG tablet TAKE ONE TABLET BY MOUTH ONCE DAILY 02/16/17   [name], MD  amoxicillin-clavulanate (AUGMENTIN) 500-125 MG tablet Take 1 tablet (500 mg total) by mouth daily. 05/07/17   Fritzi Mandes, MD  aspirin EC  81 MG tablet Take 1 tablet (81 mg total) by mouth at bedtime. Start on may 10 th 05/14/17   Fritzi Mandes, MD  calcium carbonate (TUMS - DOSED IN MG ELEMENTAL CALCIUM) 500 MG chewable tablet Chew 1 tablet (200 mg of elemental calcium total) by mouth 3 (three) times daily with meals. 12/06/12   Geradine Girt, DO  clopidogrel (PLAVIX) 75 MG tablet Take 1 tablet (75 mg total) by mouth daily. 05/14/17   Fritzi Mandes, MD  feeding supplement, ENSURE ENLIVE, (ENSURE ENLIVE) LIQD Take 237 mLs by mouth 3 (three) times daily between meals. 01/08/17   Fritzi Mandes, MD  folic acid (FOLVITE) 0.5 MG tablet Take 0.5 tablets (0.5  mg total) by mouth daily. 12/06/12   Geradine Girt, DO  Ganciclovir (ZIRGAN) 0.15 % GEL Place 1 drop into the right eye 4 (four) times daily. 01/08/17   Fritzi Mandes, MD  HYDROcodone-acetaminophen (NORCO/VICODIN) 5-325 MG tablet Take 1-2 tablets by mouth every 4 (four) hours as needed for moderate pain. 04/28/17   Nicholes Mango, MD  levothyroxine (SYNTHROID, LEVOTHROID) 75 MCG tablet TAKE ONE TABLET BY MOUTH ONCE DAILY 07/07/16   Viviana Simpler I, MD  metoprolol tartrate (LOPRESSOR) 50 MG tablet TAKE ONE TABLET BY MOUTH TWICE DAILY 02/05/17   Minna Merritts, MD  multivitamin (RENA-VIT) TABS tablet Take 1 tablet by mouth at bedtime. 01/08/17   Fritzi Mandes, MD  ondansetron (ZOFRAN) 4 MG tablet Take 1 tablet (4 mg total) by mouth every 6 (six) hours as needed for nausea. 04/28/17   Nicholes Mango, MD  polyvinyl alcohol (LIQUIFILM TEARS) 1.4 % ophthalmic solution Place 1 drop into both eyes as needed for dry eyes. 09/22/15   Fritzi Mandes, MD  pravastatin (PRAVACHOL) 40 MG tablet TAKE ONE TABLET BY MOUTH IN THE EVENING 07/21/16   Gollan, Kathlene November, MD  senna-docusate (SENOKOT-S) 8.6-50 MG tablet Take 1 tablet by mouth at bedtime as needed for mild constipation. 04/28/17   Nicholes Mango, MD  vitamin C (VITAMIN C) 500 MG tablet Take 1 tablet (500 mg total) by mouth daily. 12/06/12   Geradine Girt, DO      VITAL  SIGNS:  Blood pressure (!) 95/40, pulse 81, temperature 98 F (36.7 C), temperature source Oral, resp. rate (!) 24, height 5\' 3"  (1.6 m), weight 45.4 kg (100 lb), SpO2 100 %.  PHYSICAL EXAMINATION:   Physical Exam  Constitutional: She is oriented to person, place, and time. No distress.  Frail-appearing  HENT:  Head: Normocephalic.  Eyes: No scleral icterus.  Neck: Normal range of motion. Neck supple. No JVD present. No tracheal deviation present.  Cardiovascular: Normal rate, regular rhythm and normal heart sounds. Exam reveals no gallop and no friction rub.  No murmur heard. Pulmonary/Chest: Effort normal and breath sounds normal. No respiratory distress. She has no wheezes. She has no rales. She exhibits no tenderness.  Abdominal: Soft. Bowel sounds are normal. She exhibits no distension and no mass. There is no tenderness. There is no rebound and no guarding.  Musculoskeletal: Normal range of motion. She exhibits no edema.  Generalized weakness  Neurological: She is alert and oriented to person, place, and time.  Skin: Skin is warm. No rash noted. No erythema.  Psychiatric:  Does not provide good HPI.      LABORATORY PANEL:   CBC Recent Labs  Lab 05/17/17 1015  WBC 12.6*  HGB 9.0*  HCT 27.3*  PLT 271   ------------------------------------------------------------------------------------------------------------------  Chemistries  Recent Labs  Lab 05/17/17 1015  NA 130*  K 4.9  CL 94*  CO2 28  GLUCOSE 163*  BUN 55*  CREATININE 4.87*  CALCIUM 8.1*  AST 25  ALT 9*  ALKPHOS 143*  BILITOT 0.7   ------------------------------------------------------------------------------------------------------------------  Cardiac Enzymes Recent Labs  Lab 05/17/17 1015  TROPONINI 0.04*   ------------------------------------------------------------------------------------------------------------------  RADIOLOGY:  Dg Chest Port 1 View  Result Date:  05/17/2017 CLINICAL DATA:  Hypertension. Rectal bleeding. Altered mental status. EXAM: PORTABLE CHEST 1 VIEW COMPARISON:  January 03, 2017 FINDINGS: There is a right pleural effusion with a much smaller left pleural effusion. There is bibasilar atelectatic change. There is patchy airspace consolidation in the right upper lobe. Heart is mildly  enlarged with pulmonary venous hypertension. No adenopathy evident. Patient is status post coronary artery bypass grafting. There is aortic atherosclerosis. There is a stent in the left axillary region. No evident bone lesions. IMPRESSION: There is underlying pulmonary vascular congestion. Pleural effusions bilaterally, larger on the right than on the left. Bibasilar atelectasis. Suspect a degree of superimposed pneumonia right upper lobe. There is aortic atherosclerosis. Patient is status post coronary artery bypass grafting. Aortic Atherosclerosis (ICD10-I70.0). Electronically Signed   By: Lowella Grip III M.D.   On: 05/17/2017 10:32    EKG:   Sinus tachycardia no ST elevation or depression IMPRESSION AND PLAN:   84 year old female with end-stage renal disease on hemodialysis who was recently discharged from the hospital with diverticular bleed who presents from [facility] due to bright red blood per rectum.  1.  Bright red blood per rectum: This is again likely diverticular nature Follow hemoglobin every 8 hours GI consultation requested Hold any anticoagulation  2.  End-stage renal disease on hemodialysis: Consult nephrology for dialysis  3.  Right upper lobe superimposed pneumonia on chest x-ray: We will treat with Zosyn and obtain speech consultation  4.  Essential hypertension: Continue Norvasc and metoprolol  5.  Hypothyroid is not: Continue Synthroid  6.  Chronic hyponatremia: Repeat BMP in a.m. 7.  Diabetes: Sliding scale initiated  All the records are reviewed and case discussed with ED provider. Management plans discussed with  the patient and she is in agreement  CODE STATUS: DNR  TOTAL TIME TAKING CARE OF THIS PATIENT: 40 minutes.    Leocadia Idleman M.D on 05/17/2017 at 11:19 AM  Between 7am to 6pm - Pager - [phone]  After 6pm go to [email or web address] - password [email or web address] [facility]  Office  [phone]  CC: Primary care physician; [name], MD

## 2017-05-17 NOTE — ED Notes (Signed)
Pt cleaned with warm wipes and placed new brief on. Small amount of bright red blood noted to old brief.

## 2017-05-17 NOTE — NC FL2 (Signed)
[place] LEVEL OF CARE SCREENING TOOL     IDENTIFICATION  Patient Name: [name] Birthdate: [birth year] Sex: female Admission Date (Current Location): 05/17/2017  [place] and [place] Number:  Engineering geologist and Address:  [facility], [address] [place], [place] [address]      Provider Number: [id number]  Attending Physician Name and Address:  Bettey Costa, MD  Relative Name and Phone Number:  Danalee Flath- son     Current Level of Care: Hospital Recommended Level of Care: [place] Prior Approval Number:    Date Approved/Denied:   PASRR Number: [id number]  Discharge Plan: SNF    Current Diagnoses: Patient Active Problem List   Diagnosis Date Noted  . GIB (gastrointestinal bleeding) 05/17/2017  . GI bleed 05/05/2017  . Pelvic fracture ([place]) 04/26/2017  . Protein-calorie malnutrition, severe 01/07/2017  . CHF (congestive heart failure) ([place]) 01/03/2017  . UTI (urinary tract infection) 01/03/2017  . Acute metabolic encephalopathy 51/02/5850  . Cerebrovascular accident (CVA) due to occlusion of right middle cerebral artery ([place]) 10/24/2015  . Malnutrition of mild degree ([place]) 10/24/2015  . Altered mental status 09/18/2015  . Preventative health care 05/03/2014  . Peripheral vascular disease ([place]) 12/21/2013  . Toe amputation status ([place]) 08/29/2013  . History of recent fall 04/18/2013  . HTN (hypertension), malignant 03/22/2013  . S/P aortic valve replacement with bioprosthetic valve 11/15/2012  . S/P CABG x 3 11/15/2012  . Anemia of chronic kidney failure 12/22/2010  . Thrombocytopenia ([place]) 12/13/2010  . End stage renal disease on dialysis ([place]) 12/13/2010  . S/P aortic valve replacement 08/11/2010  . Arterial insufficiency-lower 07/25/2010  . NEURODERMATITIS 04/08/2009  . Carotid arterial disease ([place]) 01/01/2009  . Coronary atherosclerosis 02/11/2007  . AORTIC STENOSIS 10/04/2006  . ANXIETY  05/30/2006  . Pulmonary hypertension ([place]) 05/30/2006  . Hypothyroidism 05/27/2006  . Type 2 diabetes mellitus with renal manifestations, controlled ([place]) 05/27/2006  . Hyperlipidemia 05/27/2006  . Essential hypertension 05/27/2006  . GERD 05/27/2006  . OSTEOARTHRITIS 05/27/2006  . OSTEOPENIA 05/27/2006  . URINARY INCONTINENCE 05/27/2006    Orientation RESPIRATION BLADDER Height & Weight     Self    Incontinent Weight: 100 lb (45.4 kg) Height:  5\' 3"  (160 cm)  BEHAVIORAL SYMPTOMS/MOOD NEUROLOGICAL BOWEL NUTRITION STATUS      Incontinent Diet(Clear liquid diet)  AMBULATORY STATUS COMMUNICATION OF NEEDS Skin   Extensive Assist Verbally Skin abrasions                       Personal Care Assistance Level of Assistance  Bathing, Feeding, Dressing Bathing Assistance: Maximum assistance Feeding assistance: Limited assistance       Functional Limitations Info  Hearing, Sight, Speech Sight Info: Adequate Hearing Info: Impaired Speech Info: Adequate    SPECIAL CARE FACTORS FREQUENCY  PT (By licensed PT), OT (By licensed OT)     PT Frequency: x5 OT Frequency: x5            Contractures Contractures Info: Not present    Additional Factors Info  Code Status, Allergies Code Status Info: DNR Allergies Info: Nitrofurantoin, Captopril, Enalapril Maleate, Ramipril, Sulfa Antibiotics, Verapamil           Current Medications (05/17/2017):  This is the current hospital active medication list Current Facility-Administered Medications  Medication Dose Route Frequency Provider Last Rate Last Dose  . 0.9 %  sodium chloride infusion  10 mL/hr Intravenous Once Lavonia Drafts, MD      .  ceFEPIme (MAXIPIME) 1 g in sodium chloride 0.9 % 100 mL IVPB  1 g Intravenous Once Lavonia Drafts, MD      . insulin aspart (novoLOG) injection 0-9 Units  0-9 Units Subcutaneous TID WC Bettey Costa, MD       Current Outpatient Medications  Medication Sig Dispense Refill  . acetaminophen  (TYLENOL) 325 MG tablet Take 1 tablet (325 mg total) by mouth every 6 (six) hours as needed for mild pain or fever (or Fever >/= 101).    Marland Kitchen albuterol (PROVENTIL) (2.5 MG/3ML) 0.083% nebulizer solution Take 3 mLs (2.5 mg total) by nebulization every 2 (two) hours as needed for wheezing. 75 mL 12  . amLODipine (NORVASC) 5 MG tablet TAKE ONE TABLET BY MOUTH ONCE DAILY 90 tablet 3  . amoxicillin-clavulanate (AUGMENTIN) 500-125 MG tablet Take 1 tablet (500 mg total) by mouth daily. 16 tablet 0  . aspirin EC 81 MG tablet Take 1 tablet (81 mg total) by mouth at bedtime. Start on may 10 th 30 tablet 0  . calcium carbonate (TUMS - DOSED IN MG ELEMENTAL CALCIUM) 500 MG chewable tablet Chew 1 tablet (200 mg of elemental calcium total) by mouth 3 (three) times daily with meals.    . clopidogrel (PLAVIX) 75 MG tablet Take 1 tablet (75 mg total) by mouth daily. 30 tablet 1  . feeding supplement, ENSURE ENLIVE, (ENSURE ENLIVE) LIQD Take 237 mLs by mouth 3 (three) times daily between meals. 854 mL 12  . folic acid (FOLVITE) 0.5 MG tablet Take 0.5 tablets (0.5 mg total) by mouth daily.    . Ganciclovir (ZIRGAN) 0.15 % GEL Place 1 drop into the right eye 4 (four) times daily. 5 g 0  . HYDROcodone-acetaminophen (NORCO/VICODIN) 5-325 MG tablet Take 1-2 tablets by mouth every 4 (four) hours as needed for moderate pain. 30 tablet 0  . levothyroxine (SYNTHROID, LEVOTHROID) 75 MCG tablet TAKE ONE TABLET BY MOUTH ONCE DAILY 30 tablet 11  . metoprolol tartrate (LOPRESSOR) 50 MG tablet TAKE ONE TABLET BY MOUTH TWICE DAILY 60 tablet 0  . multivitamin (RENA-VIT) TABS tablet Take 1 tablet by mouth at bedtime. 30 tablet 0  . ondansetron (ZOFRAN) 4 MG tablet Take 1 tablet (4 mg total) by mouth every 6 (six) hours as needed for nausea. 20 tablet 0  . polyvinyl alcohol (LIQUIFILM TEARS) 1.4 % ophthalmic solution Place 1 drop into both eyes as needed for dry eyes. 15 mL 0  . pravastatin (PRAVACHOL) 40 MG tablet TAKE ONE TABLET BY  MOUTH IN THE EVENING 90 tablet 4  . senna-docusate (SENOKOT-S) 8.6-50 MG tablet Take 1 tablet by mouth at bedtime as needed for mild constipation.    . vitamin C (VITAMIN C) 500 MG tablet Take 1 tablet (500 mg total) by mouth daily.       Discharge Medications: Please see discharge summary for a list of discharge medications.  Relevant Imaging Results:  Relevant Lab Results:   Additional Information (S) [id number]  Joana Reamer, LCSW

## 2017-05-17 NOTE — Progress Notes (Signed)
[facility]  ROUNDING NOTE   Subjective:   [name] admitted to [facility] on 05/17/2017 for Pneumonia [J18.9] Acute GI bleeding [K92.2] Healthcare-associated pneumonia [J18.9]  Missed dialysis today.   Objective:  Vital signs in last 24 hours:  Temp:  [98 F (36.7 C)-98.8 F (37.1 C)] 98.8 F (37.1 C) (05/13 1327) Pulse Rate:  [81-93] 82 (05/13 1327) Resp:  [20-32] 24 (05/13 1327) BP: (91-136)/(39-47) 117/39 (05/13 1327) SpO2:  [88 %-100 %] 97 % (05/13 1327) Weight:  [45.4 kg (100 lb)] 45.4 kg (100 lb) (05/13 1001)  Weight change:  Filed Weights   05/17/17 1001  Weight: 45.4 kg (100 lb)    Intake/Output: No intake/output data recorded.   Intake/Output this shift:  No intake/output data recorded.  Physical Exam: General: NAD,   Head: Normocephalic, atraumatic. Moist oral mucosal membranes  Eyes: Anicteric, PERRL  Neck: Supple, trachea midline  Lungs:  Clear to auscultation  Heart: Regular rate and rhythm  Abdomen:  Soft, nontender,   Extremities: no peripheral edema.  Neurologic: Nonfocal, moving all four extremities  Skin: No lesions  Access: Left AVF    Basic Metabolic Panel: Recent Labs  Lab 05/17/17 1015  NA 130*  K 4.9  CL 94*  CO2 28  GLUCOSE 163*  BUN 55*  CREATININE 4.87*  CALCIUM 8.1*    Liver Function Tests: Recent Labs  Lab 05/17/17 1015  AST 25  ALT 9*  ALKPHOS 143*  BILITOT 0.7  PROT 6.0*  ALBUMIN 2.4*   No results for input(s): LIPASE, AMYLASE in the last 168 hours. No results for input(s): AMMONIA in the last 168 hours.  CBC: Recent Labs  Lab 05/17/17 1015 05/17/17 1421  WBC 12.6*  --   HGB 9.0* 7.4*  HCT 27.3*  --   MCV 101.7*  --   PLT 271  --     Cardiac Enzymes: Recent Labs  Lab 05/17/17 1015  TROPONINI 0.04*    BNP: Invalid input(s): POCBNP  CBG: No results for input(s): GLUCAP in the last 168 hours.  Microbiology: Results for orders placed or performed during the hospital  encounter of 05/05/17  MRSA PCR Screening     Status: None   Collection Time: 05/06/17  4:27 AM  Result Value Ref Range Status   MRSA by PCR NEGATIVE NEGATIVE Final    Comment:        The GeneXpert MRSA Assay (FDA approved for NASAL specimens only), is one component of a comprehensive MRSA colonization surveillance program. It is not intended to diagnose MRSA infection nor to guide or monitor treatment for MRSA infections. Performed at [facility], [address] [place], [place] [address]     Coagulation Studies: Recent Labs    05/17/17 1138  LABPROT 14.4  INR 1.13    Urinalysis: No results for input(s): COLORURINE, LABSPEC, PHURINE, GLUCOSEU, HGBUR, BILIRUBINUR, KETONESUR, PROTEINUR, UROBILINOGEN, NITRITE, LEUKOCYTESUR in the last 72 hours.  Invalid input(s): APPERANCEUR    Imaging: Dg Chest Port 1 View  Result Date: 05/17/2017 CLINICAL DATA:  Hypertension. Rectal bleeding. Altered mental status. EXAM: PORTABLE CHEST 1 VIEW COMPARISON:  January 03, 2017 FINDINGS: There is a right pleural effusion with a much smaller left pleural effusion. There is bibasilar atelectatic change. There is patchy airspace consolidation in the right upper lobe. Heart is mildly enlarged with pulmonary venous hypertension. No adenopathy evident. Patient is status post coronary artery bypass grafting. There is aortic atherosclerosis. There is a stent in the left axillary  region. No evident bone lesions. IMPRESSION: There is underlying pulmonary vascular congestion. Pleural effusions bilaterally, larger on the right than on the left. Bibasilar atelectasis. Suspect a degree of superimposed pneumonia right upper lobe. There is aortic atherosclerosis. Patient is status post coronary artery bypass grafting. Aortic Atherosclerosis (ICD10-I70.0). Electronically Signed   By: Lowella Grip III M.D.   On: 05/17/2017 10:32     Medications:   . sodium chloride    . sodium chloride    .  piperacillin-tazobactam (ZOSYN)  IV 3.375 g (05/17/17 1518)   . [START ON 05/18/2017] amLODipine  5 mg Oral Daily  . aspirin EC  81 mg Oral QHS  . calcium carbonate  1 tablet Oral TID WC  . feeding supplement (ENSURE ENLIVE)  237 mL Oral TID BM  . folic acid  0.5 mg Oral Daily  . Ganciclovir  1 drop Right Eye QID  . insulin aspart  0-9 Units Subcutaneous TID WC  . [START ON 05/18/2017] levothyroxine  75 mcg Oral BH-q7a  . metoprolol tartrate  50 mg Oral BID  . multivitamin  1 tablet Oral QHS  . pravastatin  40 mg Oral QPM  . [START ON 05/18/2017] ascorbic acid  500 mg Oral Daily   acetaminophen, albuterol, HYDROcodone-acetaminophen, ondansetron **OR** ondansetron (ZOFRAN) IV, polyethylene glycol, polyvinyl alcohol, senna-docusate  Assessment/ Plan:  [name] is a 84 y.o. white female with end stage renal disease on hemodialysis, coronary artery disease, history of bilateral knee replacement, aortic valve replacement 2009, diabetes, hypertension, hypothyroidism, peripheral vascular disease, pulmonary hypertension, anxiety  [facility] [address]/MWF  1. End Stage Renal Disease: holding dialysis today due to hypotension. No indication for dialysis at this time - Plan on dialysis tomorrow and then resume MWF schedule.  - Discontinue IV fluids  2. Hypotension:  - discontinue amlodipine and metoprolol  3. Pneumonia: Health care acquired. Leukocytosis. Afebrile.  - empiric pip/tazo.   4. Anemia of chronic kidney disease: hemoglobin 7.4 - mircera as outpatient  5. Secondary Hyperparathyroidism:  - tums with meals.    LOS: 0 Wania Longstreth 5/13/20193:34 PM

## 2017-05-17 NOTE — Consult Note (Addendum)
Cephas Darby, MD [address]  [place]  [name], [name] [address]  Main: [phone]  Fax: [phone] Pager: [phone]   Consultation  Referring Provider:     No ref. provider found Primary Care Physician:  [name], MD Primary Gastroenterologist:  Dr. Sherri Sear         Reason for Consultation:     Rectal bleeding  Date of Admission:  05/17/2017 Date of Consultation:  05/17/2017         HPI:   [name] is a 84 y.o. female with history of end-stage renal disease, on hemodialysis, on aspirin and Plavix secondary to coronary artery disease, aortic stenosis, bioprosthetic aortic valve replacement was recently admitted to [facility] secondary to rectal bleeding, which was presumed to be secondary to diverticulitis. Apparently, she had a CT abdomen and pelvis which revealed thickening of the sigmoid colon and rectum. She was treated with Cipro and Flagyl. Her hemoglobin on discharge was 10. She returned to [facility] today from [facility] secondary to bright red bleeding per rectum. This was noticed today and per EMS her bed sheets were covered with full of blood clots. When I saw her today, patient had a bowel movement it was brown stool mixed with dark red blood and small clots. Her son is bedside. Her hemoglobin on arrival today was 9, dropped to 7.4, she is receiving one unit PRBCs. She has mildly elevated leukocytosis. Patient is a poor historian,uncomfortable with lower abdominal discomfort, reports that she is feeling sick in her stomach She is empirically started on Zosyn  NSAIDs: aspirin 81  Antiplts/Anticoagulants/Anti thrombotics: asa81 and Plavix 75 mg  GI Procedures: underwent colonoscopy at [facility] in 2008, report not available  Past Medical History:  Diagnosis Date  . Anemia    NOS  . Anxiety   . Aortic stenosis   . Cancer [facility])    Mole on top of head to be removed in July 2013  . Chronic  diastolic CHF (congestive heart failure) ([place])    a. 01/2017 Echo: EF 60-65%, no rwma, Gr2 DD, mild to mod MR, mildly dil LA/RA, nl RV fxn, Sev TR, PASP 54mHg.  . Constipation   . Coronary artery disease    a. 2009 CABG x 3 w/ bioprosthetic AVR.  . Diabetes mellitus    type II;was on Glipizide but has been off x 66mo . [place] [place]  . Diarrhea   . ESR[place] stage renal disease) ([place]  . GERD (gastroesophageal reflux disease)   . History of blood transfusion   . Hyperlipidemia    takes Simvastatin nightly  . Hypertension    takes Metoprolol daily  . Hypothyroidism    takes Synthroid daily  . Migraine    hx of  . Oligouria   . Osteoarthritis   . Osteopenia   . Peripheral vascular disease ([facility]  . Personal history of colonic polyps   . Pulmonary hypertension ([facility]  . Urinary incontinence   . UTI (urinary tract infection)     Past Surgical History:  Procedure Laterality Date  . A/V FISTULAGRAM [name] 01/07/2017   Procedure: A/V Fistulagram;  Surgeon: DeAlgernon HuxleyMD;  [facility];  Service: Cardiovascular;  Laterality: [name];  . A/V SHUNT INTERVENTION [name] 01/07/2017   Procedure: A/V SHUNT INTERVENTION;  Surgeon: DeAlgernon HuxleyMD;  [facility];  Service: Cardiovascular;  Laterality:  [name];  . ABDOMINAL HYSTERECTOMY    . adenosine myoview  2007   benign, EF 69%  . AMPUTATION  06/18/2011   Procedure: AMPUTATION DIGIT;  Surgeon: Newt Minion, MD;  Location: [place];  Service: Orthopedics;  Laterality: Left;  Left 2nd Toe Amputation at MTP Joint  . Amputation-left great toe  7/12   Dr Marlou Sa  . AORTIC VALVE REPLACEMENT  2009  . APPENDECTOMY    . AV FISTULA PLACEMENT  12/22/2010   Procedure: ARTERIOVENOUS (AV) FISTULA CREATION;  Surgeon: Hinda Lenis, MD;  Location: [place];  Service: Vascular;  Laterality: Left;  Creation of Left Brachial-Cephalic Fistula  . CARDIAC CATHETERIZATION  2000   cad  . CAROTID ENDARTERECTOMY  2011   Right   . CATARACT EXTRACTION    . CORONARY ARTERY BYPASS GRAFT     od  . CORONARY ARTERY BYPASS GRAFT  2009  . EYE SURGERY     BIlateral  . INSERTION OF DIALYSIS CATHETER  12/18/2010   Procedure: INSERTION OF DIALYSIS CATHETER;  Surgeon: Mal Misty, MD;  Location: [place];  Service: Vascular;  Laterality: Right;  . REPLACEMENT TOTAL KNEE BILATERAL  05/1998  . TONSILLECTOMY    . Traumatic Amputation of Right DIP joint of Index Finger      Prior to Admission medications   Medication Sig Start Date End Date Taking? Authorizing Provider  Amino Acids-Protein Hydrolys (FEEDING SUPPLEMENT, PRO-STAT SUGAR FREE 64,) LIQD Take 30 mLs by mouth 3 (three) times daily with meals.   Yes [provider]  amLODipine (NORVASC) 5 MG tablet TAKE ONE TABLET BY MOUTH ONCE DAILY 02/16/17  Yes [name], MD  aspirin EC 81 MG tablet Take 1 tablet (81 mg total) by mouth at bedtime. Start on may 10 th 05/14/17  Yes Fritzi Mandes, MD  calcium carbonate (TUMS - DOSED IN MG ELEMENTAL CALCIUM) 500 MG chewable tablet Chew 1 tablet (200 mg of elemental calcium total) by mouth 3 (three) times daily with meals. 12/06/12  Yes Geradine Girt, DO  clopidogrel (PLAVIX) 75 MG tablet Take 1 tablet (75 mg total) by mouth daily. 05/14/17  Yes Fritzi Mandes, MD  folic acid (FOLVITE) 0.5 MG tablet Take 0.5 tablets (0.5 mg total) by mouth daily. 12/06/12  Yes Geradine Girt, DO  Ganciclovir (ZIRGAN) 0.15 % GEL Place 1 drop into the right eye 4 (four) times daily. 01/08/17  Yes Fritzi Mandes, MD  hydrALAZINE (APRESOLINE) 25 MG tablet Take 25 mg by mouth 3 (three) times daily.   Yes [provider]  levothyroxine (SYNTHROID, LEVOTHROID) 75 MCG tablet TAKE ONE TABLET BY MOUTH ONCE DAILY 07/07/16  Yes Viviana Simpler I, MD  metoprolol tartrate (LOPRESSOR) 50 MG tablet TAKE ONE TABLET BY MOUTH TWICE DAILY 02/05/17  Yes Gollan, Kathlene November, MD  multivitamin (RENA-VIT) TABS tablet Take 1 tablet by mouth at bedtime. 01/08/17  Yes Fritzi Mandes, MD  pravastatin (PRAVACHOL) 40 MG tablet TAKE ONE TABLET BY MOUTH IN THE EVENING 07/21/16  Yes Gollan, Kathlene November, MD  vitamin C (VITAMIN C) 500 MG tablet Take 1 tablet (500 mg total) by mouth daily. Patient taking differently: Take 500 mg by mouth 2 (two) times daily.  12/06/12  Yes Eulogio Bear U, DO  zinc sulfate 220 (50 Zn) MG capsule Take 220 mg by mouth daily.   Yes [provider]  acetaminophen (TYLENOL) 325 MG tablet Take 1 tablet (325 mg total) by mouth every 6 (six) hours as needed for  mild pain or fever (or Fever >/= 101). 04/28/17   Gouru, Aruna, MD  albuterol (PROVENTIL) (2.5 MG/3ML) 0.083% nebulizer solution Take 3 mLs (2.5 mg total) by nebulization every 2 (two) hours as needed for wheezing. 04/28/17   Nicholes Mango, MD  amoxicillin-clavulanate (AUGMENTIN) 500-125 MG tablet Take 1 tablet (500 mg total) by mouth daily. Patient not taking: Reported on 05/17/2017 05/07/17   Fritzi Mandes, MD  feeding supplement, ENSURE ENLIVE, (ENSURE ENLIVE) LIQD Take 237 mLs by mouth 3 (three) times daily between meals. 01/08/17   Fritzi Mandes, MD  HYDROcodone-acetaminophen (NORCO/VICODIN) 5-325 MG tablet Take 1-2 tablets by mouth every 4 (four) hours as needed for moderate pain. 04/28/17   Gouru, Illene Silver, MD  ondansetron (ZOFRAN) 4 MG tablet Take 1 tablet (4 mg total) by mouth every 6 (six) hours as needed for nausea. 04/28/17   Nicholes Mango, MD  polyvinyl alcohol (LIQUIFILM TEARS) 1.4 % ophthalmic solution Place 1 drop into both eyes as needed for dry eyes. 09/22/15   Fritzi Mandes, MD  senna-docusate (SENOKOT-S) 8.6-50 MG tablet Take 1 tablet by mouth at bedtime as needed for mild constipation. 04/28/17   Nicholes Mango, MD   Current Facility-Administered Medications:  .  acetaminophen (TYLENOL) tablet 325 mg, 325 mg, Oral, Q6H PRN, Mody, Sital, MD .  albuterol (PROVENTIL) (2.5 MG/3ML) 0.083% nebulizer solution 2.5 mg, 2.5 mg, Nebulization, Q2H PRN, Bettey Costa, MD .  Derrill Memo ON 05/18/2017] amLODipine  (NORVASC) tablet 5 mg, 5 mg, Oral, Daily, Mody, Sital, MD .  aspirin EC tablet 81 mg, 81 mg, Oral, QHS, Mody, Sital, MD .  calcium carbonate (TUMS - dosed in mg elemental calcium) chewable tablet 200 mg of elemental calcium, 1 tablet, Oral, TID WC, Mody, Sital, MD .  feeding supplement (ENSURE ENLIVE) (ENSURE ENLIVE) liquid 237 mL, 237 mL, Oral, TID BM, Mody, Sital, MD .  folic acid (FOLVITE) tablet 0.5 mg, 0.5 mg, Oral, Daily, Mody, Sital, MD .  Ganciclovir (ZIRGAN) 0.15 % ophthalmic gel 1 drop, 1 drop, Right Eye, QID, Mody, Sital, MD .  HYDROcodone-acetaminophen (NORCO/VICODIN) 5-325 MG per tablet 1-2 tablet, 1-2 tablet, Oral, Q4H PRN, Mody, Sital, MD .  insulin aspart (novoLOG) injection 0-9 Units, 0-9 Units, Subcutaneous, TID WC, Mody, Sital, MD .  Derrill Memo ON 05/18/2017] levothyroxine (SYNTHROID, LEVOTHROID) tablet 75 mcg, 75 mcg, Oral, BH-q7a, Mody, Sital, MD .  metoprolol tartrate (LOPRESSOR) tablet 50 mg, 50 mg, Oral, BID, Mody, Sital, MD .  metroNIDAZOLE (FLAGYL) IVPB 500 mg, 500 mg, Intravenous, Q8H, Murphy Duzan, Tally Due, MD .  multivitamin (RENA-VIT) tablet 1 tablet, 1 tablet, Oral, QHS, Mody, Sital, MD .  ondansetron (ZOFRAN) tablet 4 mg, 4 mg, Oral, Q6H PRN **OR** ondansetron (ZOFRAN) injection 4 mg, 4 mg, Intravenous, Q6H PRN, Benjie Karvonen, Sital, MD, 4 mg at 05/17/17 1508 .  polyethylene glycol (MIRALAX / GLYCOLAX) packet 17 g, 17 g, Oral, Daily PRN, Mody, Sital, MD .  polyvinyl alcohol (LIQUIFILM TEARS) 1.4 % ophthalmic solution 1 drop, 1 drop, Both Eyes, PRN, Mody, Sital, MD .  pravastatin (PRAVACHOL) tablet 40 mg, 40 mg, Oral, QPM, Mody, Sital, MD .  senna-docusate (Senokot-S) tablet 1 tablet, 1 tablet, Oral, QHS PRN, Mody, Sital, MD .  vancomycin (VANCOCIN) 50 mg/mL oral solution 500 mg, 500 mg, Oral, Q6H, Kelissa Merlin, Tally Due, MD .  Derrill Memo ON 05/18/2017] vitamin C (ASCORBIC ACID) tablet 500 mg, 500 mg, Oral, Daily, Mody, Sital, MD   Family History  Family history unknown: Yes      Social History   Tobacco  Use  . Smoking status: Former Smoker    Years: 15.00    Types: Cigarettes    Last attempt to quit: 01/06/1943    Years since quitting: 74.4  . Smokeless tobacco: Never Used  Substance Use Topics  . Alcohol use: Yes    Comment: Wine occasionally  . Drug use: No    Allergies as of 05/17/2017 - Review Complete 05/17/2017  Allergen Reaction Noted  . Nitrofurantoin Itching 02/01/2006  . Captopril Other (See Comments) 02/01/2006  . Enalapril maleate Cough 02/01/2006  . Ramipril Other (See Comments) 02/01/2006  . Sulfa antibiotics Other (See Comments) 11/21/2010  . Verapamil Other (See Comments) 02/01/2006    Review of Systems:    All systems reviewed and negative except where noted in HPI.   Physical Exam:  Vital signs in last 24 hours: Temp:  [97.8 F (36.6 C)-98.8 F (37.1 C)] 98 F (36.7 C) (05/13 2020) Pulse Rate:  [80-93] 80 (05/13 2020) Resp:  [20-32] 26 (05/13 2020) BP: (91-136)/(39-54) 130/54 (05/13 2020) SpO2:  [88 %-100 %] 98 % (05/13 2020) Weight:  [100 lb (45.4 kg)] 100 lb (45.4 kg) (05/13 1001) Last BM Date: 05/17/17 General:   In mild distress, ill appearing, lethargic Head:  Normocephalic and atraumatic. Eyes:   No icterus.   Conjunctiva pale. PERRLA. Ears:  decreased auditory acuity. Neck:  Supple; no masses or thyroidomegaly Lungs: Respirations even and unlabored. Lungs clear to auscultation bilaterally.   No wheezes, crackles, or rhonchi.  Heart:  Regular rate and rhythm;  Without murmur, clicks, rubs or gallops Abdomen:  Soft, nondistended, diffuse abdominal tenderness, worse in lower abdomen. Normal bowel sounds. No appreciable masses or hepatomegaly.  No rebound or guarding.  Rectal:  Not performed, brown stool mixed with dark red blood Msk:  Symmetrical without gross deformities.  Strength week  Extremities:  Without edema, cyanosis or clubbing. Neurologic:  Lethargic, not answering questions Skin:  Intact without  significant lesions or rashes.  LAB RESULTS: CBC Latest Ref Rng & Units 05/17/2017 05/17/2017 05/07/2017  WBC 3.6 - 11.0 K/uL - 12.6(H) 11.3(H)  Hemoglobin 12.0 - 16.0 g/dL 7.4(L) 9.0(L) 10.0(L)  Hematocrit 35.0 - 47.0 % - 27.3(L) 29.8(L)  Platelets 150 - 440 K/uL - 271 215    BMET BMP Latest Ref Rng & Units 05/17/2017 05/07/2017 05/06/2017  Glucose 65 - 99 mg/dL 163(H) 214(H) 105(H)  BUN 6 - 20 mg/dL 55(H) 57(H) 38(H)  Creatinine 0.44 - 1.00 mg/dL 4.87(H) 4.98(H) 3.89(H)  Sodium 135 - 145 mmol/L 130(L) 129(L) 132(L)  Potassium 3.5 - 5.1 mmol/L 4.9 4.6 4.4  Chloride 101 - 111 mmol/L 94(L) 90(L) 93(L)  CO2 22 - 32 mmol/L 28 26 30   Calcium 8.9 - 10.3 mg/dL 8.1(L) 8.1(L) 7.9(L)    LFT Hepatic Function Latest Ref Rng & Units 05/17/2017 05/07/2017 05/05/2017  Total Protein 6.5 - 8.1 g/dL 6.0(L) - 7.0  Albumin 3.5 - 5.0 g/dL 2.4(L) 2.7(L) 2.8(L)  AST 15 - 41 U/L 25 - 25  ALT 14 - 54 U/L 9(L) - 8(L)  Alk Phosphatase 38 - 126 U/L 143(H) - 141(H)  Total Bilirubin 0.3 - 1.2 mg/dL 0.7 - 1.2  Bilirubin, Direct 0.0 - 0.3 mg/dL - - -     STUDIES: Dg Chest Port 1 View  Result Date: 05/17/2017 CLINICAL DATA:  Hypertension. Rectal bleeding. Altered mental status. EXAM: PORTABLE CHEST 1 VIEW COMPARISON:  January 03, 2017 FINDINGS: There is a right pleural effusion with a much smaller left pleural effusion. There is bibasilar  atelectatic change. There is patchy airspace consolidation in the right upper lobe. Heart is mildly enlarged with pulmonary venous hypertension. No adenopathy evident. Patient is status post coronary artery bypass grafting. There is aortic atherosclerosis. There is a stent in the left axillary region. No evident bone lesions. IMPRESSION: There is underlying pulmonary vascular congestion. Pleural effusions bilaterally, larger on the right than on the left. Bibasilar atelectasis. Suspect a degree of superimposed pneumonia right upper lobe. There is aortic atherosclerosis. Patient is status  post coronary artery bypass grafting. Aortic Atherosclerosis (ICD10-I70.0). Electronically Signed   By: Lowella Grip III M.D.   On: 05/17/2017 10:32      Impression / Plan:   CONSTANCE WHITTLE is a 84 y.o. Caucasian female with ESRD, on hemodialysis, on chronic antiplatelet patient with aspirin and Plavix, recent admission with rectal bleeding and a clinically treated for acute diverticulitis, readmitted with rectal bleeding. Based on physical exam, it appears to be lower GI source. Differentials include diverticular bleed but patient has tenderness in lower abdomen which raises the concern for ischemic colitis or infectious colitis. Patient had C. Difficile in 2014   - I ordered stool test for C. Difficile and came back positive - Start metronidazole and vancomycin for 14 days total - Recommend to discontinue Zosyn - Hold aspirin and Plavix in the setting of lower GI bleed - Consider discontinuation of long-term anticoagulation given patient's age and recurrent lower GI bleed - No further GI workup at this time - Encourage by mouth intake - consider NG tube placement for tube feeds and administration of vancomycin if patient not able to tolerate by mouth well in the setting of severe illness   Thank you for involving me in the care of this patient.  We will follow along with you    LOS: 0 days   Sherri Sear, MD  05/17/2017, 8:27 PM   Note: This dictation was prepared with Dragon dictation along with smaller phrase technology. Any transcriptional errors that result from this process are unintentional.

## 2017-05-17 NOTE — Clinical Social Work Note (Signed)
Clinical Social Work Assessment  Patient Details  Name: [name] MRN: [id number] Date of Birth: 06/21/1920  Date of referral:  05/17/17               Reason for consult:  Other (Comment Required)(Facility [facility])                Permission sought to share information with:  Facility Sport and exercise psychologist, Family Supports Permission granted to share information::  Yes, Verbal Permission Granted  Name::      [name] son [phone]  Agency::     Relationship::     Contact Information:     Housing/Transportation Living arrangements for the past 2 months:  [place] of Information:  Patient, Adult Children Patient Interpreter Needed:  None Criminal Activity/Legal Involvement Pertinent to Current Situation/Hospitalization:  No - Comment as needed Significant Relationships:  Adult Children Lives with:  Facility Resident Do you feel safe going back to the place where you live?  Yes Need for family participation in patient care:  Yes (Comment)  Care giving concerns:  TBD   Social Worker assessment / plan: LCSW introduced myself to patient she was getting IV at the time by ED RN unable to assess. From previous notes patient is oriented x1 and is hard of hearing. [name]  is a 84 y.o. female with a known history of end-stage renal disease on hemodialysis, diabetes and recent hospitalization due to diverticular bleed who comes from [facility] due to bright red blood per rectum. 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) PT Recommendations:  [place] / Referral to community resources:  [place]  Patient/Family's Response to care:  TBD  Patient/Family's  Understanding of and Emotional Response to Diagnosis, Current Treatment, and Prognosis:  TBD  Emotional Assessment Appearance:  Appears stated age Attitude/Demeanor/Rapport:  Engaged Affect (typically observed):  Accepting, Calm Orientation:  Oriented to Self Alcohol / Substance use:  Not Applicable Psych involvement (Current and /or in the community):  No (Comment)  Discharge Needs  Concerns to be addressed:  No discharge needs identified Readmission within the last 30 days:  Yes Current discharge risk:  Chronically ill, Dependent with Mobility Barriers to Discharge:  Continued Medical Work up   [name] Reamer, LCSW 05/17/2017, 11:33 AM

## 2017-05-17 NOTE — ED Provider Notes (Signed)
[facility] Emergency Department Provider Note   ____________________________________________    I have reviewed the triage vital signs and the nursing notes.   HISTORY  Chief Complaint Rectal Bleeding     HPI [name] is a 84 y.o. female with a history of diabetes, end-stage renal disease, hypertension who presents with reported rectal bleeding.  [facility], reports of bright red blood per rectum.  EMS verifies that the sheets were covered in bright red blood upon their arrival.  Patient denies abdominal pain or dizziness however she is a relatively poor historian.  Review of medical records demonstrates recent admission and discharge on 3 May for diverticular bleed requiring transfusion.  Do not see blood thinners on medication list  Past Medical History:  Diagnosis Date  . Anemia    NOS  . Anxiety   . Aortic stenosis   . Cancer [facility])    Mole on top of head to be removed in July 2013  . Chronic diastolic CHF (congestive heart failure) ([place])    a. 01/2017 Echo: EF 60-65%, no rwma, Gr2 DD, mild to mod MR, mildly dil LA/RA, nl RV fxn, Sev TR, PASP 55mmHg.  . Constipation   . Coronary artery disease    a. 2009 CABG x 3 w/ bioprosthetic AVR.  . Diabetes mellitus    type II;was on Glipizide but has been off x 22mon  . Dialysis patient ([place])   . Diarrhea   . ESRD (end stage renal disease) ([place])   . GERD (gastroesophageal reflux disease)   . History of blood transfusion   . Hyperlipidemia    takes Simvastatin nightly  . Hypertension    takes Metoprolol daily  . Hypothyroidism    takes Synthroid daily  . Migraine    hx of  . Oligouria   . Osteoarthritis   . Osteopenia   . Peripheral vascular disease ([place])   . Personal history of colonic polyps   . Pulmonary hypertension ([place])   . Urinary incontinence   . UTI (urinary tract infection)     Patient Active Problem List   Diagnosis Date Noted  . GI bleed 05/05/2017    . Pelvic fracture ([place]) 04/26/2017  . Protein-calorie malnutrition, severe 01/07/2017  . CHF (congestive heart failure) ([place]) 01/03/2017  . UTI (urinary tract infection) 01/03/2017  . Acute metabolic encephalopathy 20/25/4270  . Cerebrovascular accident (CVA) due to occlusion of right middle cerebral artery ([place]) 10/24/2015  . Malnutrition of mild degree ([place]) 10/24/2015  . Altered mental status 09/18/2015  . Preventative health care 05/03/2014  . Peripheral vascular disease ([place]) 12/21/2013  . Toe amputation status ([place]) 08/29/2013  . History of recent fall 04/18/2013  . HTN (hypertension), malignant 03/22/2013  . S/P aortic valve replacement with bioprosthetic valve 11/15/2012  . S/P CABG x 3 11/15/2012  . Anemia of chronic kidney failure 12/22/2010  . Thrombocytopenia ([place]) 12/13/2010  . End stage renal disease on dialysis ([place]) 12/13/2010  . S/P aortic valve replacement 08/11/2010  . Arterial insufficiency-lower 07/25/2010  . NEURODERMATITIS 04/08/2009  . Carotid arterial disease ([place]) 01/01/2009  . Coronary atherosclerosis 02/11/2007  . AORTIC STENOSIS 10/04/2006  . ANXIETY 05/30/2006  . Pulmonary hypertension ([place]) 05/30/2006  . Hypothyroidism 05/27/2006  . Type 2 diabetes mellitus with renal manifestations, controlled ([place]) 05/27/2006  . Hyperlipidemia 05/27/2006  . Essential hypertension 05/27/2006  . GERD 05/27/2006  . OSTEOARTHRITIS 05/27/2006  . OSTEOPENIA 05/27/2006  . URINARY INCONTINENCE 05/27/2006  Past Surgical History:  Procedure Laterality Date  . A/V FISTULAGRAM [name] 01/07/2017   Procedure: A/V Fistulagram;  Surgeon: Algernon Huxley, MD;  Location: [place] [facility];  Service: Cardiovascular;  Laterality: [name];  . A/V SHUNT INTERVENTION [name] 01/07/2017   Procedure: A/V SHUNT INTERVENTION;  Surgeon: Algernon Huxley, MD;  Location: [place] [facility];  Service: Cardiovascular;  Laterality: [name];  . ABDOMINAL HYSTERECTOMY    . adenosine myoview  2007   benign,  EF 69%  . AMPUTATION  06/18/2011   Procedure: AMPUTATION DIGIT;  Surgeon: Newt Minion, MD;  Location: [facility];  Service: Orthopedics;  Laterality: Left;  Left 2nd Toe Amputation at MTP Joint  . Amputation-left great toe  7/12   Dr Marlou Sa  . AORTIC VALVE REPLACEMENT  2009  . APPENDECTOMY    . AV FISTULA PLACEMENT  12/22/2010   Procedure: ARTERIOVENOUS (AV) FISTULA CREATION;  Surgeon: Hinda Lenis, MD;  Location: [place];  Service: Vascular;  Laterality: Left;  Creation of Left Brachial-Cephalic Fistula  . CARDIAC CATHETERIZATION  2000   cad  . CAROTID ENDARTERECTOMY  2011   Right  . CATARACT EXTRACTION    . CORONARY ARTERY BYPASS GRAFT     od  . CORONARY ARTERY BYPASS GRAFT  2009  . EYE SURGERY     BIlateral  . INSERTION OF DIALYSIS CATHETER  12/18/2010   Procedure: INSERTION OF DIALYSIS CATHETER;  Surgeon: Mal Misty, MD;  Location: [place];  Service: Vascular;  Laterality: Right;  . REPLACEMENT TOTAL KNEE BILATERAL  05/1998  . TONSILLECTOMY    . Traumatic Amputation of Right DIP joint of Index Finger      Prior to Admission medications   Medication Sig Start Date End Date Taking? Authorizing Provider  acetaminophen (TYLENOL) 325 MG tablet Take 1 tablet (325 mg total) by mouth every 6 (six) hours as needed for mild pain or fever (or Fever >/= 101). 04/28/17   Gouru, Aruna, MD  albuterol (PROVENTIL) (2.5 MG/3ML) 0.083% nebulizer solution Take 3 mLs (2.5 mg total) by nebulization every 2 (two) hours as needed for wheezing. 04/28/17   Nicholes Mango, MD  amLODipine (NORVASC) 5 MG tablet TAKE ONE TABLET BY MOUTH ONCE DAILY 02/16/17   [name], MD  amoxicillin-clavulanate (AUGMENTIN) 500-125 MG tablet Take 1 tablet (500 mg total) by mouth daily. 05/07/17   Fritzi Mandes, MD  aspirin EC 81 MG tablet Take 1 tablet (81 mg total) by mouth at bedtime. Start on may 10 th 05/14/17   Fritzi Mandes, MD  calcium carbonate (TUMS - DOSED IN MG ELEMENTAL CALCIUM) 500 MG chewable tablet Chew 1 tablet  (200 mg of elemental calcium total) by mouth 3 (three) times daily with meals. 12/06/12   Geradine Girt, DO  clopidogrel (PLAVIX) 75 MG tablet Take 1 tablet (75 mg total) by mouth daily. 05/14/17   Fritzi Mandes, MD  feeding supplement, ENSURE ENLIVE, (ENSURE ENLIVE) LIQD Take 237 mLs by mouth 3 (three) times daily between meals. 01/08/17   Fritzi Mandes, MD  folic acid (FOLVITE) 0.5 MG tablet Take 0.5 tablets (0.5 mg total) by mouth daily. 12/06/12   Geradine Girt, DO  Ganciclovir (ZIRGAN) 0.15 % GEL Place 1 drop into the right eye 4 (four) times daily. 01/08/17   Fritzi Mandes, MD  HYDROcodone-acetaminophen (NORCO/VICODIN) 5-325 MG tablet Take 1-2 tablets by mouth every 4 (four) hours as needed for moderate pain. 04/28/17   Nicholes Mango, MD  levothyroxine (SYNTHROID, LEVOTHROID) 75  MCG tablet TAKE ONE TABLET BY MOUTH ONCE DAILY 07/07/16   Viviana Simpler I, MD  metoprolol tartrate (LOPRESSOR) 50 MG tablet TAKE ONE TABLET BY MOUTH TWICE DAILY 02/05/17   Minna Merritts, MD  multivitamin (RENA-VIT) TABS tablet Take 1 tablet by mouth at bedtime. 01/08/17   Fritzi Mandes, MD  ondansetron (ZOFRAN) 4 MG tablet Take 1 tablet (4 mg total) by mouth every 6 (six) hours as needed for nausea. 04/28/17   Nicholes Mango, MD  polyvinyl alcohol (LIQUIFILM TEARS) 1.4 % ophthalmic solution Place 1 drop into both eyes as needed for dry eyes. 09/22/15   Fritzi Mandes, MD  pravastatin (PRAVACHOL) 40 MG tablet TAKE ONE TABLET BY MOUTH IN THE EVENING 07/21/16   Gollan, Kathlene November, MD  senna-docusate (SENOKOT-S) 8.6-50 MG tablet Take 1 tablet by mouth at bedtime as needed for mild constipation. 04/28/17   Nicholes Mango, MD  vitamin C (VITAMIN C) 500 MG tablet Take 1 tablet (500 mg total) by mouth daily. 12/06/12   Geradine Girt, DO     Allergies Nitrofurantoin; Captopril; Enalapril maleate; Ramipril; Sulfa antibiotics; and Verapamil  Family History  Family history unknown: Yes    Social History Social History   Tobacco Use  . Smoking  status: Former Smoker    Years: 15.00    Types: Cigarettes    Last attempt to quit: 01/06/1943    Years since quitting: 74.4  . Smokeless tobacco: Never Used  Substance Use Topics  . Alcohol use: Yes    Comment: Wine occasionally  . Drug use: No    Review of Systems  Constitutional: No dizziness Eyes: No visual changes.  ENT: No neck pain Cardiovascular: Denies chest pain. Respiratory: Denies shortness of breath. Gastrointestinal: No reports of abdominal pain, GI bleed as above Genitourinary: Patient denies dysuria Musculoskeletal: No joint swelling Skin: Negative for pallor Neurological: Negative for headaches   ____________________________________________   PHYSICAL EXAM:  VITAL SIGNS: ED Triage Vitals  Enc Vitals Group     BP 05/17/17 1000 (!) 136/43     Pulse Rate 05/17/17 1000 93     Resp 05/17/17 1000 20     Temp 05/17/17 1000 98 F (36.7 C)     Temp Source 05/17/17 1000 Oral     SpO2 05/17/17 1000 (!) 88 %     Weight --      Height --      Head Circumference --      [place] [place] --      Pain Score 05/17/17 1001 5     Pain Loc --      Pain Edu? --      Excl. in [place]? --     Constitutional: Alert. Pleasant and interactive Eyes: Conjunctivae are normal.   Nose: No congestion/rhinnorhea. Mouth/Throat: Mucous membranes are moist.    Cardiovascular: Normal rate, regular rhythm. Grossly normal heart sounds.  Good peripheral circulation. Respiratory: Normal respiratory effort.  No retractions. Lungs CTAB. Gastrointestinal: Soft and nontender. No distention.    Musculoskeletal:  Warm and well perfused Neurologic:  Normal speech and language. No gross focal neurologic deficits are appreciated.  Skin:  Skin is warm, dry and intact. No rash noted. Psychiatric: Mood and affect are normal. Speech and behavior are normal.  ____________________________________________   LABS (all labs ordered are listed, but only abnormal results are displayed)  Labs Reviewed    CBC - Abnormal; Notable for the following components:      Result Value   WBC 12.6 (*)  RBC 2.69 (*)    Hemoglobin 9.0 (*)    HCT 27.3 (*)    MCV 101.7 (*)    RDW 18.3 (*)    All other components within normal limits  COMPREHENSIVE METABOLIC PANEL - Abnormal; Notable for the following components:   Sodium 130 (*)    Chloride 94 (*)    Glucose, Bld 163 (*)    BUN 55 (*)    Creatinine, Ser 4.87 (*)    Calcium 8.1 (*)    Total Protein 6.0 (*)    Albumin 2.4 (*)    ALT 9 (*)    Alkaline Phosphatase 143 (*)    GFR calc non Af Amer 7 (*)    GFR calc Af Amer 8 (*)    All other components within normal limits  TROPONIN I - Abnormal; Notable for the following components:   Troponin I 0.04 (*)    All other components within normal limits  PROTIME-INR  APTT  TYPE AND SCREEN  TYPE AND SCREEN  PREPARE RBC (CROSSMATCH)   ____________________________________________  EKG  ED ECG REPORT I, Lavonia Drafts, the attending physician, personally viewed and interpreted this ECG.  Date: 05/17/2017  Rhythm: Sinus tachycardia QRS Axis left axis deviation Intervals: normal ST/T Wave abnormalities: normal Narrative Interpretation: no evidence of acute ischemia  ____________________________________________  RADIOLOGY  Vascular congestion, pleural effusions, possible pneumonia right upper lobe ____________________________________________   PROCEDURES  Procedure(s) performed: No  Procedures   Critical Care performed: yes  CRITICAL CARE Performed by: Lavonia Drafts   Total critical care time: 35 minutes  Critical care time was exclusive of separately billable procedures and treating other patients.  Critical care was necessary to treat or prevent imminent or life-threatening deterioration.  Critical care was time spent personally by me on the following activities: development of treatment plan with patient and/or surrogate as well as nursing, discussions with consultants,  evaluation of patient's response to treatment, examination of patient, obtaining history from patient or surrogate, ordering and performing treatments and interventions, ordering and review of laboratory studies, ordering and review of radiographic studies, pulse oximetry and re-evaluation of patient's condition.  ____________________________________________   INITIAL IMPRESSION / ASSESSMENT AND PLAN / ED COURSE  Pertinent labs & imaging results that were available during my care of the patient were reviewed by me and considered in my medical decision making (see chart for details).  Patient presents to the emergency department from nursing home for reports of GI bleed.  History of diverticular bleed requiring admission 10 days ago as well as transfusion.  Will send type and screen, coags, CBC CMP and monitor carefully.  Vitals stable at this time.  We will also order x-ray given mild hypoxia.  Patient with bright red blood per rectum, dripping out of her bottom onto the bed.  Hemoglobin is 9 down from 10, ordered emergent transfusion given severity of bleed.  Given hypoxia and concern for possible superimposed pneumonia will cover with antibiotics as well    ____________________________________________   FINAL CLINICAL IMPRESSION(S) / ED DIAGNOSES  Final diagnoses:  Acute GI bleeding  Healthcare-associated pneumonia        Note:  This document was prepared using Dragon voice recognition software and may include unintentional dictation errors.    Lavonia Drafts, MD 05/17/17 1102

## 2017-05-17 NOTE — ED Notes (Signed)
Computer e-signature pad not working. Pt signed paper form of blood consent form. Placed on pt chart.

## 2017-05-18 ENCOUNTER — Inpatient Hospital Stay: Payer: Medicare Other

## 2017-05-18 LAB — IRON AND TIBC
IRON: 76 ug/dL (ref 28–170)
Saturation Ratios: 56 % — ABNORMAL HIGH (ref 10.4–31.8)
TIBC: 137 ug/dL — ABNORMAL LOW (ref 250–450)
UIBC: 61 ug/dL

## 2017-05-18 LAB — BASIC METABOLIC PANEL
ANION GAP: 10 (ref 5–15)
BUN: 67 mg/dL — ABNORMAL HIGH (ref 6–20)
CO2: 26 mmol/L (ref 22–32)
Calcium: 7.7 mg/dL — ABNORMAL LOW (ref 8.9–10.3)
Chloride: 96 mmol/L — ABNORMAL LOW (ref 101–111)
Creatinine, Ser: 5.37 mg/dL — ABNORMAL HIGH (ref 0.44–1.00)
GFR calc Af Amer: 7 mL/min — ABNORMAL LOW (ref >60)
GFR, EST NON AFRICAN AMERICAN: 6 mL/min — AB (ref >60)
GLUCOSE: 95 mg/dL (ref 65–99)
POTASSIUM: 5.2 mmol/L — AB (ref 3.5–5.1)
SODIUM: 132 mmol/L — AB (ref 135–145)

## 2017-05-18 LAB — CBC
HCT: 23.1 % — ABNORMAL LOW (ref 35.0–47.0)
HEMOGLOBIN: 7.8 g/dL — AB (ref 12.0–16.0)
MCH: 31.5 pg (ref 26.0–34.0)
MCHC: 33.9 g/dL (ref 32.0–36.0)
MCV: 92.9 fL (ref 80.0–100.0)
Platelets: 198 10*3/uL (ref 150–440)
RBC: 2.48 MIL/uL — ABNORMAL LOW (ref 3.80–5.20)
RDW: 21.4 % — ABNORMAL HIGH (ref 11.5–14.5)
WBC: 11.6 10*3/uL — AB (ref 3.6–11.0)

## 2017-05-18 LAB — GLUCOSE, CAPILLARY
GLUCOSE-CAPILLARY: 79 mg/dL (ref 65–99)
GLUCOSE-CAPILLARY: 138 mg/dL — AB (ref 65–99)
Glucose-Capillary: 92 mg/dL (ref 65–99)
Glucose-Capillary: 174 mg/dL — ABNORMAL HIGH (ref 65–99)

## 2017-05-18 LAB — FOLATE: FOLATE: 36 ng/mL (ref >5.9)

## 2017-05-18 LAB — VITAMIN B12: Vitamin B-12: 600 pg/mL (ref 180–914)

## 2017-05-18 LAB — HEMOGLOBIN: HEMOGLOBIN: 7.7 g/dL — AB (ref 12.0–16.0)

## 2017-05-18 LAB — FERRITIN

## 2017-05-18 MED ORDER — VITAMIN C 500 MG PO TABS
250.0000 mg | ORAL_TABLET | Freq: Two times a day (BID) | ORAL | Status: DC
  Administered 2017-05-18 – 2017-05-26 (×13): 250 mg via ORAL
  Filled 2017-05-18 (×17): qty 0.5

## 2017-05-18 NOTE — Progress Notes (Signed)
Post HD assessment unchanged  

## 2017-05-18 NOTE — Progress Notes (Signed)
PT Cancellation Note  Patient Details Name: [name] MRN: [id number] DOB: 1920-10-06   Cancelled Treatment:    Reason Eval/Treat Not Completed: Patient at procedure or test/unavailable(Pt at dialysis. Will attempt again at later date time. )   10:55 AM, 05/18/17 Etta Grandchild, PT, DPT Physical Therapist - [facility]  [phone] ([place])     Julyssa Kyer C 05/18/2017, 10:54 AM

## 2017-05-18 NOTE — Progress Notes (Signed)
Pre HD  

## 2017-05-18 NOTE — Progress Notes (Signed)
Hemodialysis initiated via L AVF using 15g needles x2 without issue. NO heparin treatment. 2.5 hours, 0.5L goal as ordered. Will place on 2k bath. MD at bedside. Continue to monitor.

## 2017-05-18 NOTE — Care Management (Signed)
Amanda Morris dialysis liaison notified of admission.    

## 2017-05-18 NOTE — Progress Notes (Signed)
Patient ID: [name], female   DOB: [birth year], 84 y.o.   MRN: [id number]  [employer] PROGRESS NOTE  [name] BJS:[id number] DOB: 08-Apr-1920 DOA: 05/17/2017 PCP: [name], MD  HPI/Subjective: Patient was seen earlier while on dialysis.  She is hard of hearing so she is difficult to communicate with.  She does not offer any complaints.  Objective: Vitals:   05/18/17 1225 05/18/17 1321  BP: (!) 141/50 (!) 151/45  Pulse: 91   Resp: 15   Temp: (!) 97.4 F (36.3 C)   SpO2: 99%     Filed Weights   05/17/17 1001 05/18/17 0945 05/18/17 1225  Weight: 45.4 kg (100 lb) 45.6 kg (100 lb 8.5 oz) 45.1 kg (99 lb 6.8 oz)    ROS: Review of Systems  Constitutional: Negative for chills and fever.  HENT: Positive for hearing loss.   Eyes: Negative for pain.  Respiratory: Negative for cough and shortness of breath.   Cardiovascular: Negative for chest pain.  Gastrointestinal: Negative for abdominal pain, constipation, diarrhea, nausea and vomiting.  Genitourinary: Negative for dysuria.  Musculoskeletal: Negative for joint pain.  Neurological: Negative for dizziness.   Exam: Physical Exam  HENT:  Nose: No mucosal edema.  Mouth/Throat: No oropharyngeal exudate or posterior oropharyngeal edema.  Eyes: Pupils are equal, round, and reactive to light. Conjunctivae, EOM and lids are normal.  Neck: No JVD present. Carotid bruit is not present. No edema present. No thyroid mass and no thyromegaly present.  Cardiovascular: S1 normal and S2 normal. Exam reveals no gallop.  No murmur heard. Pulses:      Dorsalis pedis pulses are 2+ on the right side, and 2+ on the left side.  Respiratory: No respiratory distress. She has no wheezes. She has no rhonchi. She has no rales.  GI: Soft. Bowel sounds are normal. There is no tenderness.  Musculoskeletal:       Right ankle: She exhibits no swelling.       Left ankle: She exhibits no swelling.  Lymphadenopathy:    She has no cervical  adenopathy.  Neurological: She is alert.  Able to straight leg raise.  Skin: Skin is warm. Nails show no clubbing.  Psychiatric: She has a normal mood and affect.      Data Reviewed: Basic Metabolic Panel: Recent Labs  Lab 05/17/17 1015 05/18/17 0512  NA 130* 132*  K 4.9 5.2*  CL 94* 96*  CO2 28 26  GLUCOSE 163* 95  BUN 55* 67*  CREATININE 4.87* 5.37*  CALCIUM 8.1* 7.7*   Liver Function Tests: Recent Labs  Lab 05/17/17 1015  AST 25  ALT 9*  ALKPHOS 143*  BILITOT 0.7  PROT 6.0*  ALBUMIN 2.4*    CBC: Recent Labs  Lab 05/17/17 1015 05/17/17 1421 05/17/17 2207 05/18/17 0512 05/18/17 1417  WBC 12.6*  --   --  11.6*  --   HGB 9.0* 7.4* 8.7* 7.8* 7.7*  HCT 27.3*  --   --  23.1*  --   MCV 101.7*  --   --  92.9  --   PLT 271  --   --  198  --    Cardiac Enzymes: Recent Labs  Lab 05/17/17 1015  TROPONINI 0.04*    CBG: Recent Labs  Lab 05/17/17 1715 05/17/17 2121 05/18/17 0741 05/18/17 1215  GLUCAP 110* 108* 92 79    Recent Results (from the past 240 hour(s))  Blood culture (routine x 2)     Status: None (Preliminary  result)   Collection Time: 05/17/17 11:16 AM  Result Value Ref Range Status   Specimen Description BLOOD BLOOD RIGHT HAND  Final   Special Requests   Final    BOTTLES DRAWN AEROBIC AND ANAEROBIC Blood Culture adequate volume   Culture   Final    NO GROWTH < 24 HOURS Performed at [facility], [address] [place], [place] [address]    Report Status PENDING  Incomplete  Blood culture (routine x 2)     Status: None (Preliminary result)   Collection Time: 05/17/17 11:16 AM  Result Value Ref Range Status   Specimen Description BLOOD FOREARM  Final   Special Requests   Final    BOTTLES DRAWN AEROBIC AND ANAEROBIC Blood Culture adequate volume   Culture   Final    NO GROWTH < 24 HOURS Performed at [facility], [address], [place], [place] [address]    Report Status PENDING  Incomplete  C difficile quick  scan w PCR reflex     Status: Abnormal   Collection Time: 05/17/17  6:25 PM  Result Value Ref Range Status   C Diff antigen POSITIVE (A) NEGATIVE Final   C Diff toxin POSITIVE (A) NEGATIVE Final   C Diff interpretation Toxin producing C. difficile detected.  Final    Comment: RESULT CALLED TO, READ BACK BY AND VERIFIED WITH: [name] @1954  05/17/17 AKT Performed at [facility], [address] [place], [place] [address]      Studies: Dg Chest 1 View  Result Date: 05/18/2017 CLINICAL DATA:  84 y/o  F; pneumonia. EXAM: CHEST  1 VIEW COMPARISON:  05/07/2017 chest radiograph FINDINGS: Stable cardiomegaly given projection and technique post CABG. Aortic atherosclerosis with calcification. Stable right larger than left pleural effusions and bibasilar opacities. No acute osseous abnormality is evident. IMPRESSION: 1. Stable right larger than left pleural effusions and basilar opacities which may represent associated atelectasis or pneumonia. 2. Aortic atherosclerosis. 3. Stable cardiomegaly. Electronically Signed   By: Kristine Garbe M.D.   On: 05/18/2017 04:19   Dg Chest Port 1 View  Result Date: 05/17/2017 CLINICAL DATA:  Hypertension. Rectal bleeding. Altered mental status. EXAM: PORTABLE CHEST 1 VIEW COMPARISON:  January 03, 2017 FINDINGS: There is a right pleural effusion with a much smaller left pleural effusion. There is bibasilar atelectatic change. There is patchy airspace consolidation in the right upper lobe. Heart is mildly enlarged with pulmonary venous hypertension. No adenopathy evident. Patient is status post coronary artery bypass grafting. There is aortic atherosclerosis. There is a stent in the left axillary region. No evident bone lesions. IMPRESSION: There is underlying pulmonary vascular congestion. Pleural effusions bilaterally, larger on the right than on the left. Bibasilar atelectasis. Suspect a degree of superimposed pneumonia right upper lobe. There  is aortic atherosclerosis. Patient is status post coronary artery bypass grafting. Aortic Atherosclerosis (ICD10-I70.0). Electronically Signed   By: Lowella Grip III M.D.   On: 05/17/2017 10:32    Scheduled Meds: . amLODipine  5 mg Oral Daily  . calcium carbonate  1 tablet Oral TID WC  . feeding supplement (ENSURE ENLIVE)  237 mL Oral TID BM  . folic acid  0.5 mg Oral Daily  . Ganciclovir  1 drop Right Eye QID  . insulin aspart  0-9 Units Subcutaneous TID WC  . levothyroxine  75 mcg Oral BH-q7a  . metoprolol tartrate  50 mg Oral BID  . multivitamin  1 tablet Oral QHS  . pravastatin  40 mg Oral QPM  . vancomycin  500 mg Oral Q6H  . ascorbic acid  250 mg Oral BID   Continuous Infusions: . metronidazole 500 mg (05/18/17 1355)    Assessment/Plan:  1. C. difficile colitis.   patient on oral vancomycin and IV Flagyl. 2. Acute blood loss anemia.  Patient had a recent hospitalization with diverticular bleed.  Monitor hemoglobin closely for need for transfusion.  Stop aspirin and Plavix. 3. Essential hypertension on Norvasc and metoprolol 4. Hyperlipidemia on pravastatin 5. End-stage renal disease on dialysis as per nephrology 6. Hypothyroidism unspecified on levothyroxine 7. History of CAD.  Unable to give blood thinner at this time.  Code Status:     Code Status Orders  (From admission, onward)        Start     Ordered   05/17/17 1347  Do not attempt resuscitation (DNR)  Continuous    Question Answer Comment  In the event of cardiac or respiratory ARREST Do not call a "code blue"   In the event of cardiac or respiratory ARREST Do not perform Intubation, CPR, defibrillation or ACLS   In the event of cardiac or respiratory ARREST Use medication by any route, position, wound care, and other measures to relive pain and suffering. May use oxygen, suction and manual treatment of airway obstruction as needed for comfort.      05/17/17 1346    Code Status History    Date Active  Date Inactive Code Status Order ID Comments User Context   05/05/2017 1547 05/07/2017 2121 DNR [id number]  Hillary Bow, MD ED   04/26/2017 1709 04/28/2017 2046 DNR [id number]  Demetrios Loll, MD Inpatient   01/03/2017 1328 01/08/2017 2014 DNR [id number]  Idelle Crouch, MD Inpatient   01/03/2017 1031 01/03/2017 1328 DNR [id number]  Merlyn Lot, MD ED   09/18/2015 1817 09/22/2015 1920 DNR [id number]  Fritzi Mandes, MD Inpatient   09/18/2015 1516 09/18/2015 1817 DNR [id number]  Flora Lipps, MD ED   03/22/2013 2100 03/26/2013 1540 Full Code [id number]  Rise Patience, MD Inpatient   11/20/2012 1756 12/06/2012 2008 DNR [id number]  Annita Brod, MD Inpatient   06/18/2011 1042 06/19/2011 1401 Full Code [id number]  Paulene Floor, RN Inpatient   12/14/2010 1124 12/29/2010 1543 DNR [id number]  Bonnielee Haff, MD Inpatient   12/13/2010 0113 12/14/2010 1123 Full Code [id number]  Sid Falcon, RN Inpatient   11/22/2010 0216 11/28/2010 2239 DNR [id number]  Viviano Simas, RN Inpatient    Advance Directive Documentation     Most Recent Value  Type of Advance Directive  Healthcare Power of Attorney, Out of facility DNR (pink MOST or yellow form), Living will  Pre-existing out of facility DNR order (yellow form or pink MOST form)  Pink MOST form placed in chart (order not valid for inpatient use)  "MOST" Form in Place?  -     Family Communication: Left message for son Disposition Plan: To be determined  Consultants:  Gastroenterology  Antibiotics:   oral vancomycin  IV Flagyl  Time spent: 26 minutes  [place]

## 2017-05-18 NOTE — Progress Notes (Signed)
Patient ID: [name], female   DOB: 1920/02/17, 84 y.o.   MRN: [id number]  Spoke with son on the phone and given update  Dr. Loletha Grayer

## 2017-05-18 NOTE — Progress Notes (Signed)
Initial Nutrition Assessment  DOCUMENTATION CODES:   Severe malnutrition in context of chronic illness  INTERVENTION:   Ensure Enlive po TID, each supplement provides 350 kcal and 20 grams of protein  Magic cup TID with meals, each supplement provides 290 kcal and 9 grams of protein  Rena-vite daily  Vitamin C 250mg  po BID  Liberalize diet   NUTRITION DIAGNOSIS:   Severe Malnutrition related to chronic illness(ESRD on HD, CHF) as evidenced by moderate fat depletion, severe muscle depletion.  GOAL:   Patient will meet greater than or equal to 90% of their needs  MONITOR:   PO intake, Supplement acceptance, Labs, Weight trends, I & O's, Skin  REASON FOR ASSESSMENT:   Other (Comment)(low BMI)    ASSESSMENT:   84 y.o. female with a known history of diabetes mellitus, hypertension, CAD status post CABG, CVA, end-stage renal disease on hemodialysis admitted with GIB. Found to have C-Diff  L AVF. Resides at [facility].   Visited pt's room today. RD is familiar with this pt from multiple previous admits. Pt with poor appetite and oral intake at baseline. Pt is a poor historian and is unable to provide any history today. Pt has reported on previous admits that she likes chocolate Ensure. Pt with ESRD on HD and with suspected scurvy. Pt noted to have bruising on bilateral arms and legs and multiple admits for GIB. Per chart, pt with wt gain since last admit in January. Pt currently eating only sips and bites of meals. RD will order supplements. Recommend vitamin C supplementation 250mg  po BID x 30 days.    Medications reviewed and include: aspirin, tums, folic acid, insulin, synthroid, rena-vite, vancomycin, vit C, metronidazole  Labs reviewed: Na 132(L), K 5.2(H), BUN 67(H), creat 5.37(H), Ca 7.7(L) Wbc- 11.6(H), Hgb 7.8(L), Hct 23.1(L)  NUTRITION - FOCUSED PHYSICAL EXAM:    Most Recent Value  Orbital Region  Mild depletion  Upper Arm Region  Moderate depletion  Thoracic  and Lumbar Region  Moderate depletion  Buccal Region  Mild depletion  Temple Region  Moderate depletion  Clavicle Bone Region  Severe depletion  Clavicle and Acromion Bone Region  Severe depletion  Scapular Bone Region  Moderate depletion  Dorsal Hand  Severe depletion  Patellar Region  Severe depletion  Anterior Thigh Region  Severe depletion  Posterior Calf Region  Severe depletion  Edema (RD Assessment)  Mild  Hair  Reviewed  Eyes  Reviewed  Mouth  Reviewed  Skin  Reviewed  Nails  Reviewed     Diet Order:   Diet Order           Diet renal with fluid restriction Fluid restriction: 1200 mL Fluid; Room service appropriate? Yes; Fluid consistency: Thin  Diet effective now         EDUCATION NEEDS:   Not appropriate for education at this time  Skin:  Skin Assessment: Reviewed RN Assessment(ecchymosis )  Last BM:  5/14- type 7  Height:   Ht Readings from Last 1 Encounters:  05/17/17 5\' 3"  (1.6 m)    Weight:   Wt Readings from Last 1 Encounters:  05/18/17 99 lb 6.8 oz (45.1 kg)    Ideal Body Weight:  52.3 kg  BMI:  Body mass index is 17.61 kg/m.  Estimated Nutritional Needs:   Kcal:  1200-1400kcal/day   Protein:  72-82g/day   Fluid:  >1.2L/day   Koleen Distance MS, RD, LDN Pager #[phone] After Hours Pager: [phone]

## 2017-05-18 NOTE — Progress Notes (Signed)
SLP Cancellation Note  Patient Details Name: [name] MRN: [id number] DOB: 1920/04/13   Cancelled treatment:       Reason Eval/Treat Not Completed: Medical issues which prohibited therapy(chart reviewed; consulted NSG re: pt's status this AM). Per NSG, pt is NPO for possible GI reasons and intervention; NSG will f/u w/ GI when rounding and discuss. NSG to f/u w/ ST services after to direct re: BSE/po's.    Orinda Kenner, [place], CCC-SLP Watson,Katherine 05/18/2017, 9:16 AM

## 2017-05-18 NOTE — Progress Notes (Signed)
Pre HD assessment. Patient in no distress. Lungs clear/diminished bilaterally. No edema. Requesting eye drops. BP 141/60, HR 76. Patient stable for dialysis. All labs and orders reviewed. HD at bedside.

## 2017-05-18 NOTE — Progress Notes (Signed)
[facility]  ROUNDING NOTE   Subjective:   Seen and examined on hemodialysis.. Tolerating treatment well. Patient somewhat confused. UF goal of 0.5 liter.   C.diff positive    HEMODIALYSIS FLOWSHEET:  Blood Flow Rate (mL/min): 400 mL/min Arterial Pressure (mmHg): -160 mmHg Venous Pressure (mmHg): 150 mmHg Transmembrane Pressure (mmHg): 60 mmHg Ultrafiltration Rate (mL/min): 400 mL/min Dialysate Flow Rate (mL/min): 600 ml/min Conductivity: Machine : 14 Conductivity: Machine : 14 Dialysis Fluid Bolus: Normal Saline Bolus Amount (mL): 250 mL Dialysate Change: 2K    Objective:  Vital signs in last 24 hours:  Temp:  [97.4 F (36.3 C)-98 F (36.7 C)] 97.4 F (36.3 C) (05/14 1225) Pulse Rate:  [76-91] 91 (05/14 1225) Resp:  [13-26] 15 (05/14 1225) BP: (96-151)/(39-62) 151/45 (05/14 1321) SpO2:  [98 %-100 %] 99 % (05/14 1225) Weight:  [45.1 kg (99 lb 6.8 oz)-45.6 kg (100 lb 8.5 oz)] 45.1 kg (99 lb 6.8 oz) (05/14 1225)  Weight change:  Filed Weights   05/17/17 1001 05/18/17 0945 05/18/17 1225  Weight: 45.4 kg (100 lb) 45.6 kg (100 lb 8.5 oz) 45.1 kg (99 lb 6.8 oz)    Intake/Output: I/O last 3 completed shifts: In: 740 [P.O.:120; Blood:420; IV Piggyback:200] Out: -    Intake/Output this shift:  Total I/O In: 237 [P.O.:237] Out: 500 [Other:500]  Physical Exam: General: NAD,   Head: Normocephalic, atraumatic. Moist oral mucosal membranes  Eyes: Anicteric, PERRL  Neck: Supple, trachea midline  Lungs:  Clear to auscultation  Heart: Regular rate and rhythm  Abdomen:  Soft, nontender,   Extremities: no peripheral edema.  Neurologic: Nonfocal, moving all four extremities  Skin: No lesions  Access: Left AVF    Basic Metabolic Panel: Recent Labs  Lab 05/17/17 1015 05/18/17 0512  NA 130* 132*  K 4.9 5.2*  CL 94* 96*  CO2 28 26  GLUCOSE 163* 95  BUN 55* 67*  CREATININE 4.87* 5.37*  CALCIUM 8.1* 7.7*    Liver Function Tests: Recent Labs   Lab 05/17/17 1015  AST 25  ALT 9*  ALKPHOS 143*  BILITOT 0.7  PROT 6.0*  ALBUMIN 2.4*   No results for input(s): LIPASE, AMYLASE in the last 168 hours. No results for input(s): AMMONIA in the last 168 hours.  CBC: Recent Labs  Lab 05/17/17 1015 05/17/17 1421 05/17/17 2207 05/18/17 0512 05/18/17 1417  WBC 12.6*  --   --  11.6*  --   HGB 9.0* 7.4* 8.7* 7.8* 7.7*  HCT 27.3*  --   --  23.1*  --   MCV 101.7*  --   --  92.9  --   PLT 271  --   --  198  --     Cardiac Enzymes: Recent Labs  Lab 05/17/17 1015  TROPONINI 0.04*    BNP: Invalid input(s): POCBNP  CBG: Recent Labs  Lab 05/17/17 1715 05/17/17 2121 05/18/17 0741 05/18/17 1215  GLUCAP 110* 108* 92 54    Microbiology: Results for orders placed or performed during the hospital encounter of 05/17/17  Blood culture (routine x 2)     Status: None (Preliminary result)   Collection Time: 05/17/17 11:16 AM  Result Value Ref Range Status   Specimen Description BLOOD BLOOD RIGHT HAND  Final   Special Requests   Final    BOTTLES DRAWN AEROBIC AND ANAEROBIC Blood Culture adequate volume   Culture   Final    NO GROWTH < 24 HOURS Performed at [facility], [address] [place], [place] [address]    Report Status PENDING  Incomplete  Blood culture (routine x 2)     Status: None (Preliminary result)   Collection Time: 05/17/17 11:16 AM  Result Value Ref Range Status   Specimen Description BLOOD FOREARM  Final   Special Requests   Final    BOTTLES DRAWN AEROBIC AND ANAEROBIC Blood Culture adequate volume   Culture   Final    NO GROWTH < 24 HOURS Performed at [facility], [address], [place], [place] [address]    Report Status PENDING  Incomplete  C difficile quick scan w PCR reflex     Status: Abnormal   Collection Time: 05/17/17  6:25 PM  Result Value Ref Range Status   C Diff antigen POSITIVE (A) NEGATIVE Final   C Diff toxin POSITIVE (A) NEGATIVE Final   C Diff interpretation  Toxin producing C. difficile detected.  Final    Comment: RESULT CALLED TO, READ BACK BY AND VERIFIED WITH: KIRSTEN JOYNER @1954  05/17/17 AKT Performed at [facility], [address] [place], [place] [address]     Coagulation Studies: Recent Labs    05/17/17 1138  LABPROT 14.4  INR 1.13    Urinalysis: No results for input(s): COLORURINE, LABSPEC, PHURINE, GLUCOSEU, HGBUR, BILIRUBINUR, KETONESUR, PROTEINUR, UROBILINOGEN, NITRITE, LEUKOCYTESUR in the last 72 hours.  Invalid input(s): APPERANCEUR    Imaging: Dg Chest 1 View  Result Date: 05/18/2017 CLINICAL DATA:  84 y/o  F; pneumonia. EXAM: CHEST  1 VIEW COMPARISON:  05/07/2017 chest radiograph FINDINGS: Stable cardiomegaly given projection and technique post CABG. Aortic atherosclerosis with calcification. Stable right larger than left pleural effusions and bibasilar opacities. No acute osseous abnormality is evident. IMPRESSION: 1. Stable right larger than left pleural effusions and basilar opacities which may represent associated atelectasis or pneumonia. 2. Aortic atherosclerosis. 3. Stable cardiomegaly. Electronically Signed   By: Kristine Garbe M.D.   On: 05/18/2017 04:19   Dg Chest Port 1 View  Result Date: 05/17/2017 CLINICAL DATA:  Hypertension. Rectal bleeding. Altered mental status. EXAM: PORTABLE CHEST 1 VIEW COMPARISON:  January 03, 2017 FINDINGS: There is a right pleural effusion with a much smaller left pleural effusion. There is bibasilar atelectatic change. There is patchy airspace consolidation in the right upper lobe. Heart is mildly enlarged with pulmonary venous hypertension. No adenopathy evident. Patient is status post coronary artery bypass grafting. There is aortic atherosclerosis. There is a stent in the left axillary region. No evident bone lesions. IMPRESSION: There is underlying pulmonary vascular congestion. Pleural effusions bilaterally, larger on the right than on the left. Bibasilar  atelectasis. Suspect a degree of superimposed pneumonia right upper lobe. There is aortic atherosclerosis. Patient is status post coronary artery bypass grafting. Aortic Atherosclerosis (ICD10-I70.0). Electronically Signed   By: Lowella Grip III M.D.   On: 05/17/2017 10:32     Medications:   . metronidazole 500 mg (05/18/17 1355)   . amLODipine  5 mg Oral Daily  . aspirin EC  81 mg Oral QHS  . calcium carbonate  1 tablet Oral TID WC  . feeding supplement (ENSURE ENLIVE)  237 mL Oral TID BM  . folic acid  0.5 mg Oral Daily  . Ganciclovir  1 drop Right Eye QID  . insulin aspart  0-9 Units Subcutaneous TID WC  . levothyroxine  75 mcg Oral BH-q7a  . metoprolol tartrate  50 mg Oral BID  . multivitamin  1 tablet Oral QHS  . pravastatin  40  mg Oral QPM  . vancomycin  500 mg Oral Q6H  . ascorbic acid  250 mg Oral BID   acetaminophen, albuterol, HYDROcodone-acetaminophen, ondansetron **OR** ondansetron (ZOFRAN) IV, polyethylene glycol, polyvinyl alcohol, senna-docusate  Assessment/ Plan:  [name] is a 84 y.o. white female with end stage renal disease on hemodialysis, coronary artery disease, history of bilateral knee replacement, aortic valve replacement 2009, diabetes, hypertension, hypothyroidism, peripheral vascular disease, pulmonary hypertension, anxiety  [facility]/MWF  1. End Stage Renal Disease: seen and examined on hemodialysis. Tolerating treatment well. 2K bath for hyperkalemia.  -  resume MWF schedule.  - Discontinue IV fluids  2. Hypertension: well controlled.  -  amlodipine and metoprolol  3. Anemia of chronic kidney disease: hemoglobin 7.7. Status post PRBC transfusion on 5/13.  - mircera as outpatient - EPO with MWF treatment inpatient.   4. Secondary Hyperparathyroidism:  - tums with meals.   5. C. Diff colitis: - PO vanco and IV metonidazole.    LOS: 1 [facility] 5/14/20192:37 PM

## 2017-05-18 NOTE — Progress Notes (Signed)
HD completed without issue. 0.5L uf goal met. Patient currently sleeping without complaints. Given additional blankets. Report given to primary RN

## 2017-05-19 ENCOUNTER — Inpatient Hospital Stay: Payer: Medicare Other

## 2017-05-19 DIAGNOSIS — L8990 Pressure ulcer of unspecified site, unspecified stage: Secondary | ICD-10-CM

## 2017-05-19 DIAGNOSIS — K921 Melena: Secondary | ICD-10-CM

## 2017-05-19 LAB — CBC
HEMATOCRIT: 21.2 % — AB (ref 35.0–47.0)
Hemoglobin: 7.3 g/dL — ABNORMAL LOW (ref 12.0–16.0)
MCH: 31.9 pg (ref 26.0–34.0)
MCHC: 34.3 g/dL (ref 32.0–36.0)
MCV: 92.9 fL (ref 80.0–100.0)
Platelets: 224 10*3/uL (ref 150–440)
RBC: 2.28 MIL/uL — AB (ref 3.80–5.20)
RDW: 20.9 % — AB (ref 11.5–14.5)
WBC: 11.1 10*3/uL — AB (ref 3.6–11.0)

## 2017-05-19 LAB — GLUCOSE, CAPILLARY
GLUCOSE-CAPILLARY: 126 mg/dL — AB (ref 65–99)
GLUCOSE-CAPILLARY: 132 mg/dL — AB (ref 65–99)

## 2017-05-19 LAB — HEMOGLOBIN AND HEMATOCRIT, BLOOD
HCT: 18.2 % — ABNORMAL LOW (ref 35.0–47.0)
HEMATOCRIT: 25.4 % — AB (ref 35.0–47.0)
HEMOGLOBIN: 9.2 g/dL — AB (ref 12.0–16.0)
Hemoglobin: 6.4 g/dL — ABNORMAL LOW (ref 12.0–16.0)

## 2017-05-19 LAB — PREPARE RBC (CROSSMATCH)

## 2017-05-19 MED ORDER — SODIUM CHLORIDE 0.9 % IV SOLN
80.0000 mg | Freq: Once | INTRAVENOUS | Status: DC
  Filled 2017-05-19 (×2): qty 80

## 2017-05-19 MED ORDER — TECHNETIUM TC 99M-LABELED RED BLOOD CELLS IV KIT
25.6530 | PACK | Freq: Once | INTRAVENOUS | Status: AC | PRN
  Administered 2017-05-19: 25.653 via INTRAVENOUS

## 2017-05-19 MED ORDER — FAMOTIDINE 20 MG PO TABS: 20.0000 mg | ORAL_TABLET | Freq: Every day | ORAL | Status: DC

## 2017-05-19 MED ORDER — ACETAMINOPHEN 325 MG PO TABS
650.0000 mg | ORAL_TABLET | Freq: Once | ORAL | Status: AC
  Administered 2017-05-19: 650 mg via ORAL
  Filled 2017-05-19: qty 2

## 2017-05-19 MED ORDER — SODIUM CHLORIDE 0.9 % IV SOLN
Freq: Once | INTRAVENOUS | Status: AC
  Administered 2017-05-19: 09:00:00 via INTRAVENOUS

## 2017-05-19 MED ORDER — EPOETIN ALFA 10000 UNIT/ML IJ SOLN
10000.0000 [IU] | INTRAMUSCULAR | Status: DC
  Administered 2017-05-19 – 2017-05-26 (×3): 10000 [IU] via INTRAVENOUS
  Filled 2017-05-19 (×3): qty 1

## 2017-05-19 MED ORDER — PEG 3350-KCL-NA BICARB-NACL 420 G PO SOLR
4000.0000 mL | Freq: Once | ORAL | Status: DC
  Filled 2017-05-19 (×2): qty 4000

## 2017-05-19 MED ORDER — SODIUM CHLORIDE 0.9 % IV SOLN
8.0000 mg/h | INTRAVENOUS | Status: DC
  Filled 2017-05-19: qty 80

## 2017-05-19 MED ORDER — PANTOPRAZOLE SODIUM 40 MG IV SOLR: 40.0000 mg | Freq: Two times a day (BID) | INTRAVENOUS | Status: DC

## 2017-05-19 NOTE — Progress Notes (Signed)
Post HD assessment    05/19/17 2242  Neurological  Level of Consciousness Alert  Orientation Level Oriented to person  Respiratory  Respiratory Pattern Regular;Unlabored  Chest Assessment Chest expansion symmetrical  Vascular  R Radial Pulse +2  L Radial Pulse +2  Integumentary  Integumentary (WDL) X  Skin Color Pale  Musculoskeletal  Musculoskeletal (WDL) X  Generalized Weakness Yes  GU Assessment  Genitourinary (WDL) X  Genitourinary Symptoms  (HD)  Psychosocial  Psychosocial (WDL) WDL

## 2017-05-19 NOTE — Evaluation (Signed)
Clinical/Bedside Swallow Evaluation Patient Details  Name: [name] MRN: [id number] Date of Birth: 11-May-1920  Today's Date: 05/19/2017 Time: SLP Start Time (ACUTE ONLY): 0845 SLP Stop Time (ACUTE ONLY): 0945 SLP Time Calculation (min) (ACUTE ONLY): 60 min  Past Medical History:  Past Medical History:  Diagnosis Date  . Anemia    NOS  . Anxiety   . Aortic stenosis   . Cancer [facility])    Mole on top of head to be removed in July 2013  . Chronic diastolic CHF (congestive heart failure) ([place])    a. 01/2017 Echo: EF 60-65%, no rwma, Gr2 DD, mild to mod MR, mildly dil LA/RA, nl RV fxn, Sev TR, PASP 39mmHg.  . Constipation   . Coronary artery disease    a. 2009 CABG x 3 w/ bioprosthetic AVR.  . Diabetes mellitus    type II;was on Glipizide but has been off x 41mon  . Dialysis patient ([place])   . Diarrhea   . ESRD (end stage renal disease) ([place])   . GERD (gastroesophageal reflux disease)   . History of blood transfusion   . Hyperlipidemia    takes Simvastatin nightly  . Hypertension    takes Metoprolol daily  . Hypothyroidism    takes Synthroid daily  . Migraine    hx of  . Oligouria   . Osteoarthritis   . Osteopenia   . Peripheral vascular disease ([place])   . Personal history of colonic polyps   . Pulmonary hypertension ([place])   . Urinary incontinence   . UTI (urinary tract infection)    Past Surgical History:  Past Surgical History:  Procedure Laterality Date  . A/V FISTULAGRAM [name] 01/07/2017   Procedure: A/V Fistulagram;  Surgeon: Algernon Huxley, MD;  Location: [place] [facility];  Service: Cardiovascular;  Laterality: [name];  . A/V SHUNT INTERVENTION [name] 01/07/2017   Procedure: A/V SHUNT INTERVENTION;  Surgeon: Algernon Huxley, MD;  Location: [place] [facility];  Service: Cardiovascular;  Laterality: [name];  . ABDOMINAL HYSTERECTOMY    . adenosine myoview  2007   benign, EF 69%  . AMPUTATION  06/18/2011   Procedure: AMPUTATION DIGIT;  Surgeon: Newt Minion, MD;  Location: [place];   Service: Orthopedics;  Laterality: Left;  Left 2nd Toe Amputation at MTP Joint  . Amputation-left great toe  7/12   Dr Marlou Sa  . AORTIC VALVE REPLACEMENT  2009  . APPENDECTOMY    . AV FISTULA PLACEMENT  12/22/2010   Procedure: ARTERIOVENOUS (AV) FISTULA CREATION;  Surgeon: Hinda Lenis, MD;  Location: [place];  Service: Vascular;  Laterality: Left;  Creation of Left Brachial-Cephalic Fistula  . CARDIAC CATHETERIZATION  2000   cad  . CAROTID ENDARTERECTOMY  2011   Right  . CATARACT EXTRACTION    . CORONARY ARTERY BYPASS GRAFT     od  . CORONARY ARTERY BYPASS GRAFT  2009  . EYE SURGERY     BIlateral  . INSERTION OF DIALYSIS CATHETER  12/18/2010   Procedure: INSERTION OF DIALYSIS CATHETER;  Surgeon: Mal Misty, MD;  Location: [place];  Service: Vascular;  Laterality: Right;  . REPLACEMENT TOTAL KNEE BILATERAL  05/1998  . TONSILLECTOMY    . Traumatic Amputation of Right DIP joint of Index Finger     HPI:  Pt is a 84 y.o. female with a known history of multiple medical issues including GERD, anxiety, diabetes mellitus, hypertension, CAD status post CABG, CVA, end-stage renal disease on hemodialysis recent multiple  hospitalizations due to diverticular bleed who comes from [facility] due to bright red blood per rectum.    Assessment / Plan / Recommendation Clinical Impression  Pt appears to present w/ adequate oropharyngeal phase swallow function w/ consistencies given w/ reduced risk for aspiration when following general aspiration precautions. Pt was too drowsy and about to receive a transfusion per NSG report to proceed w/ trials of increased solid foods. Pt did appear to present w/ adequate oropharyngeal phase management of boluses given w/ no overt s/s of aspiration noted; proper oral clearing post trials. Pt required verbal/tactile cues to attend to task intermittently. Pt required full feeding assist. Recommend modifying her diet to a Dysphagia level 2(MINCED foods) for easier  mastication and conservation of energy; thin liquids. Recommend aspiration precautions and pills in Puree. ST services will f/u w/ trials to upgrade diet as appropriate for pt while admitted.  SLP Visit Diagnosis: Dysphagia, oropharyngeal phase (R13.12)    Aspiration Risk  (reduced following general aspiration precautions)    Diet Recommendation  Dysphagia level 2 (MINCED foods) w/ Thin liquids; general aspiration precautions. Full Feeding Support at meals.   Medication Administration: Crushed with puree(whole if able (easier swallowing overall))    Other  Recommendations Recommended Consults: (Dietician f/u) Oral Care Recommendations: Oral care BID;Staff/trained caregiver to provide oral care Other Recommendations: (n/a)   Follow up Recommendations None(TBD)      Frequency and Duration min 3x week  1 week       Prognosis Prognosis for Safe Diet Advancement: Fair(-Good) Barriers to Reach Goals: Cognitive deficits(Drowsy)      Swallow Study   General Date of Onset: 05/17/17 HPI: Pt is a 84 y.o. female with a known history of multiple medical issues including GERD, anxiety, diabetes mellitus, hypertension, CAD status post CABG, CVA, end-stage renal disease on hemodialysis recent multiple hospitalizations due to diverticular bleed who comes from [facility] due to bright red blood per rectum.  Type of Study: Bedside Swallow Evaluation Previous Swallow Assessment: ~2 years ago; has been on a mech soft diet Diet Prior to this Study: Regular;Thin liquids Temperature Spikes Noted: (wbc 11.1; temp 99.3) Respiratory Status: Nasal cannula(2 liters) History of Recent Intubation: No Behavior/Cognition: Cooperative;Pleasant mood;Confused;Distractible;Requires cueing(drowsy) Oral Cavity Assessment: Dry Oral Care Completed by SLP: Recent completion by staff Oral Cavity - Dentition: Adequate natural dentition Vision: (n/a) Self-Feeding Abilities: Total assist Patient Positioning: Upright  in bed Baseline Vocal Quality: Low vocal intensity(1-2 words) Volitional Cough: Cognitively unable to elicit Volitional Swallow: Unable to elicit    Oral/Motor/Sensory Function Overall Oral Motor/Sensory Function: Within functional limits(w/ bolus management and oral clearing)   Ice Chips Ice chips: Within functional limits Presentation: Spoon(fed; 2 trials)   Thin Liquid Thin Liquid: Within functional limits Presentation: Cup;Straw(4 trials each)    Nectar Thick Nectar Thick Liquid: Not tested   Honey Thick Honey Thick Liquid: Not tested   Puree Puree: Within functional limits Presentation: Spoon(fed; 3 trials)   Solid   GO   Solid: Not tested Other Comments: pt is too sleepy at this time for trials of solids and to receive a transfusion per NSG report          Orinda Kenner, MS, CCC-SLP Belanna Manring 05/19/2017,9:48 AM

## 2017-05-19 NOTE — Progress Notes (Signed)
Patient ID: JOLEAN MADARIAGA, female   DOB: May 15, 1920, 84 y.o.   MRN: [id number]  [facility] PROGRESS NOTE  SHANEISHA BURKEL KGY:[id number] DOB: 11-10-1920 DOA: 05/17/2017 PCP: Viviana Simpler I, MD  HPI/Subjective: I was called in the room when the patient had a burgundy bowel movement.  Hemoglobin this morning dropped down to 6.4.  Patient a little more lethargic this morning.  Answers a few questions.  Objective: Vitals:   05/18/17 2134 05/19/17 0614  BP: (!) 122/42 (!) 128/41  Pulse: 100 89  Resp: 16 20  Temp: 99.3 F (37.4 C) (!) 97.5 F (36.4 C)  SpO2: 93% 100%    Filed Weights   05/17/17 1001 05/18/17 0945 05/18/17 1225  Weight: 45.4 kg (100 lb) 45.6 kg (100 lb 8.5 oz) 45.1 kg (99 lb 6.8 oz)    ROS: Review of Systems  Unable to perform ROS: Acuity of condition  Respiratory: Negative for shortness of breath.   Cardiovascular: Negative for chest pain.  Gastrointestinal: Positive for blood in stool. Negative for abdominal pain.   Exam: Physical Exam  Constitutional: She appears lethargic.  HENT:  Nose: No mucosal edema.  Mouth/Throat: No oropharyngeal exudate or posterior oropharyngeal edema.  Eyes: Pupils are equal, round, and reactive to light. Conjunctivae, EOM and lids are normal.  Neck: No JVD present. Carotid bruit is not present. No edema present. No thyroid mass and no thyromegaly present.  Cardiovascular: S1 normal and S2 normal. Exam reveals no gallop.  No murmur heard. Pulses:      Dorsalis pedis pulses are 2+ on the right side, and 2+ on the left side.  Respiratory: No respiratory distress. She has no wheezes. She has no rhonchi. She has no rales.  GI: Soft. Bowel sounds are normal. There is no tenderness.  Musculoskeletal:       Right ankle: She exhibits no swelling.       Left ankle: She exhibits no swelling.  Lymphadenopathy:    She has no cervical adenopathy.  Neurological: She appears lethargic.  Skin: Skin is warm. Nails show no clubbing.   Psychiatric:  Lethargic      Data Reviewed: Basic Metabolic Panel: Recent Labs  Lab 05/17/17 1015 05/18/17 0512  NA 130* 132*  K 4.9 5.2*  CL 94* 96*  CO2 28 26  GLUCOSE 163* 95  BUN 55* 67*  CREATININE 4.87* 5.37*  CALCIUM 8.1* 7.7*   Liver Function Tests: Recent Labs  Lab 05/17/17 1015  AST 25  ALT 9*  ALKPHOS 143*  BILITOT 0.7  PROT 6.0*  ALBUMIN 2.4*    CBC: Recent Labs  Lab 05/17/17 1015  05/17/17 2207 05/18/17 0512 05/18/17 1417 05/19/17 0015 05/19/17 0646  WBC 12.6*  --   --  11.6*  --  11.1*  --   HGB 9.0*   < > 8.7* 7.8* 7.7* 7.3* 6.4*  HCT 27.3*  --   --  23.1*  --  21.2* 18.2*  MCV 101.7*  --   --  92.9  --  92.9  --   PLT 271  --   --  198  --  224  --    < > = values in this interval not displayed.   Cardiac Enzymes: Recent Labs  Lab 05/17/17 1015  TROPONINI 0.04*    CBG: Recent Labs  Lab 05/18/17 0741 05/18/17 1215 05/18/17 1622 05/18/17 2125 05/19/17 0730  GLUCAP 92 79 138* 174* 126*    Recent Results (from the past 240 hour(s))  Blood culture (routine x 2)     Status: None (Preliminary result)   Collection Time: 05/17/17 11:16 AM  Result Value Ref Range Status   Specimen Description BLOOD BLOOD RIGHT HAND  Final   Special Requests   Final    BOTTLES DRAWN AEROBIC AND ANAEROBIC Blood Culture adequate volume   Culture   Final    NO GROWTH 2 DAYS Performed at [facility], [address] [place], [place] [address]    Report Status PENDING  Incomplete  Blood culture (routine x 2)     Status: None (Preliminary result)   Collection Time: 05/17/17 11:16 AM  Result Value Ref Range Status   Specimen Description BLOOD FOREARM  Final   Special Requests   Final    BOTTLES DRAWN AEROBIC AND ANAEROBIC Blood Culture adequate volume   Culture   Final    NO GROWTH 2 DAYS Performed at [facility], [address], [place], [place] [address]    Report Status PENDING  Incomplete  C difficile quick scan w PCR  reflex     Status: Abnormal   Collection Time: 05/17/17  6:25 PM  Result Value Ref Range Status   C Diff antigen POSITIVE (A) NEGATIVE Final   C Diff toxin POSITIVE (A) NEGATIVE Final   C Diff interpretation Toxin producing C. difficile detected.  Final    Comment: RESULT CALLED TO, READ BACK BY AND VERIFIED WITH: KIRSTEN JOYNER @1954  05/17/17 AKT Performed at [facility], [address] [place],  [address]      Studies: Dg Chest 1 View  Result Date: 05/18/2017 CLINICAL DATA:  84 y/o  F; pneumonia. EXAM: CHEST  1 VIEW COMPARISON:  05/07/2017 chest radiograph FINDINGS: Stable cardiomegaly given projection and technique post CABG. Aortic atherosclerosis with calcification. Stable right larger than left pleural effusions and bibasilar opacities. No acute osseous abnormality is evident. IMPRESSION: 1. Stable right larger than left pleural effusions and basilar opacities which may represent associated atelectasis or pneumonia. 2. Aortic atherosclerosis. 3. Stable cardiomegaly. Electronically Signed   By: Kristine Garbe M.D.   On: 05/18/2017 04:19   Dg Chest Port 1 View  Result Date: 05/17/2017 CLINICAL DATA:  Hypertension. Rectal bleeding. Altered mental status. EXAM: PORTABLE CHEST 1 VIEW COMPARISON:  January 03, 2017 FINDINGS: There is a right pleural effusion with a much smaller left pleural effusion. There is bibasilar atelectatic change. There is patchy airspace consolidation in the right upper lobe. Heart is mildly enlarged with pulmonary venous hypertension. No adenopathy evident. Patient is status post coronary artery bypass grafting. There is aortic atherosclerosis. There is a stent in the left axillary region. No evident bone lesions. IMPRESSION: There is underlying pulmonary vascular congestion. Pleural effusions bilaterally, larger on the right than on the left. Bibasilar atelectasis. Suspect a degree of superimposed pneumonia right upper lobe. There is aortic  atherosclerosis. Patient is status post coronary artery bypass grafting. Aortic Atherosclerosis (ICD10-I70.0). Electronically Signed   By: Lowella Grip III M.D.   On: 05/17/2017 10:32    Scheduled Meds: . calcium carbonate  1 tablet Oral TID WC  . feeding supplement (ENSURE ENLIVE)  237 mL Oral TID BM  . folic acid  0.5 mg Oral Daily  . Ganciclovir  1 drop Right Eye QID  . insulin aspart  0-9 Units Subcutaneous TID WC  . levothyroxine  75 mcg Oral BH-q7a  . multivitamin  1 tablet Oral QHS  . [START ON 05/22/2017] pantoprazole  40 mg Intravenous Q12H  .  pravastatin  40 mg Oral QPM  . vancomycin  500 mg Oral Q6H  . ascorbic acid  250 mg Oral BID   Continuous Infusions: . sodium chloride    . metronidazole Stopped (05/19/17 1194)  . pantoprazole (PROTONIX) IVPB    . pantoprozole (PROTONIX) infusion      Assessment/Plan:  1. Acute blood loss anemia with burgundy looking stool this morning.  Could still be diverticular bleed versus ischemic colitis versus upper GI bleed.  Started on Protonix drip.  Case discussed with gastroenterology.  Continue medications for C. difficile.  Transfuse 2 units of packed red blood cells.  Continue to hold aspirin and Plavix. 2. Hypotension.  Repeat blood pressure 80s.  We got to get blood transfusion in her at this point.  If blood pressure does not come up I may have to transfer to the ICU.  Hold Norvasc and metoprolol. 3. C. difficile colitis.   patient on oral vancomycin and IV Flagyl. 4. Hyperlipidemia on pravastatin 5. End-stage renal disease on dialysis as per nephrology.  spoke with nephrology to try to get dialysis scheduled for this morning.   6. Hypothyroidism unspecified on levothyroxine 7. History of CAD.  Unable to give blood thinner at this time.  Code Status:     Code Status Orders  (From admission, onward)        Start     Ordered   05/17/17 1347  Do not attempt resuscitation (DNR)  Continuous    Question Answer Comment  In  the event of cardiac or respiratory ARREST Do not call a "code blue"   In the event of cardiac or respiratory ARREST Do not perform Intubation, CPR, defibrillation or ACLS   In the event of cardiac or respiratory ARREST Use medication by any route, position, wound care, and other measures to relive pain and suffering. May use oxygen, suction and manual treatment of airway obstruction as needed for comfort.      05/17/17 1346    Code Status History    Date Active Date Inactive Code Status Order ID Comments User Context   05/05/2017 1547 05/07/2017 2121 DNR [id number]  Hillary Bow, MD ED   04/26/2017 1709 04/28/2017 2046 DNR [id number]  Demetrios Loll, MD Inpatient   01/03/2017 1328 01/08/2017 2014 DNR [id number]  Idelle Crouch, MD Inpatient   01/03/2017 1031 01/03/2017 1328 DNR [id number]  Merlyn Lot, MD ED   09/18/2015 1817 09/22/2015 1920 DNR [id number]  Fritzi Mandes, MD Inpatient   09/18/2015 1516 09/18/2015 1817 DNR [id number]  Flora Lipps, MD ED   03/22/2013 2100 03/26/2013 1540 Full Code [id number]  Rise Patience, MD Inpatient   11/20/2012 1756 12/06/2012 2008 DNR [id number]  Annita Brod, MD Inpatient   06/18/2011 1042 06/19/2011 1401 Full Code [id number]  Paulene Floor, RN Inpatient   12/14/2010 1124 12/29/2010 1543 DNR [id number]  Bonnielee Haff, MD Inpatient   12/13/2010 0113 12/14/2010 1123 Full Code [id number]  Sid Falcon, RN Inpatient   11/22/2010 0216 11/28/2010 2239 DNR [id number]  Viviano Simas, RN Inpatient    Advance Directive Documentation     Most Recent Value  Type of Advance Directive  Healthcare Power of Attorney, Out of facility DNR (pink MOST or yellow form), Living will  Pre-existing out of facility DNR order (yellow form or pink MOST form)  Pink MOST form placed in chart (order not valid for inpatient use)  "MOST" Form in Place?  -     Family Communication: Case discussed with son  this morning Disposition Plan: To be  determined  Consultants:  Gastroenterology  Nephrology  Antibiotics:  oral vancomycin  IV Flagyl  Time spent: 35 minutes.  Case discussed with nephrology to try to get dialysis this morning.  Case discussed with gastroenterology and given update  Elden Brucato [employer]

## 2017-05-19 NOTE — Progress Notes (Signed)
PT Cancellation Note  Patient Details Name: [name] MRN: [id number] DOB: [birth year]   Cancelled Treatment:    Reason Eval/Treat Not Completed: Medical issues which prohibited therapy.  Pt passed large loose stool with large clots of blood this am and Hgb 6.4.  Per rehab protocol, will hold PT until pt more medically appropriate.   Collie Siad PT, DPT 05/19/2017, 8:23 AM

## 2017-05-19 NOTE — Progress Notes (Signed)
Post HD assessment. Pt tolerated tx well without complications. Pt's heart rate is very irregular, when pt's HR decreases her 02 sats drop, RN aware, MD aware. Pt started tx on room air and went up to 2L, then 3L, and she is now on 4L at the end of tx. Net UF 767, goal not met.    05/19/17 2243  Vital Signs  Temp 98.4 F (36.9 C)  Temp Source Axillary  Pulse Rate 94  Pulse Rate Source Monitor  Resp 14  BP (!) 155/48  BP Location Right Arm  BP Method Automatic  Patient Position (if appropriate) Lying  Oxygen Therapy  SpO2 91 %  O2 Device Nasal Cannula  O2 Flow Rate (L/min) 4 L/min  Dialysis Weight  Weight 48.7 kg (107 lb 5.8 oz)  Type of Weight Post-Dialysis  Post-Hemodialysis Assessment  Rinseback Volume (mL) 250 mL  KECN 67.3 V  Dialyzer Clearance Lightly streaked  Duration of HD Treatment -hour(s) 3 hour(s)  Hemodialysis Intake (mL) 500 mL  UF Total -Machine (mL) 1267 mL  Net UF (mL) 767 mL  Tolerated HD Treatment Yes  AVG/AVF Arterial Site Held (minutes) 15 minutes  AVG/AVF Venous Site Held (minutes) 15 minutes  Education / Care Plan  Dialysis Education Provided Yes  Documented Education in Care Plan Yes  Fistula / Graft Left Upper arm Arteriovenous fistula  No Placement Date or Time found.   Orientation: Left  Access Location: Upper arm  Access Type: Arteriovenous fistula  Site Condition No complications  Fistula / Graft Assessment Present;Thrill;Bruit  Status Deaccessed  Drainage Description None

## 2017-05-19 NOTE — Progress Notes (Signed)
Patient had episode of rectal bleeding this morning passing maroon colored clot about the size of a tennis ball and had some smaller clots. Dr. Earleen Newport called in the room to assess clots. 2 units of blood ordered and administered. Patients blood pressure had dropped during bleeding episode and come back up during and post blood administration. Patient was responsive to voice and verbal during bleeding episode she just complained of being cold.

## 2017-05-19 NOTE — Progress Notes (Signed)
Pre HD assessment    05/19/17 1846  Neurological  Level of Consciousness Alert  Orientation Level Oriented to person  Respiratory  Respiratory Pattern Regular;Unlabored  Chest Assessment Chest expansion symmetrical  Vascular  R Radial Pulse +2  L Radial Pulse +2  Integumentary  Integumentary (WDL) X  Skin Color Pale  Musculoskeletal  Musculoskeletal (WDL) X  Generalized Weakness Yes  GU Assessment  Genitourinary (WDL) X  Genitourinary Symptoms  (HD)  Psychosocial  Psychosocial (WDL) WDL

## 2017-05-19 NOTE — Progress Notes (Signed)
HD tx end   05/19/17 2227  Vital Signs  Pulse Rate 90  Pulse Rate Source Monitor  Resp 15  BP (!) 132/51  BP Location Right Arm  BP Method Automatic  Patient Position (if appropriate) Lying  Oxygen Therapy  SpO2 92 %  O2 Device Nasal Cannula  O2 Flow Rate (L/min) 4 L/min  During Hemodialysis Assessment  Dialysis Fluid Bolus Normal Saline  Bolus Amount (mL) 250 mL  Intra-Hemodialysis Comments Tx completed

## 2017-05-19 NOTE — Progress Notes (Signed)
Nurse called to let me know that the patient is back from her procedure and available to start dialysis.    05/19/17 1840  Hand-Off documentation  Report given to (Full Name) Stark Bray  Report received from (Full Name) Lorrin Jackson, RN

## 2017-05-19 NOTE — Progress Notes (Signed)
[facility]  ROUNDING NOTE   Subjective:   Hemoglobin 6.6 - 2 units PRBC ordered. Bloody bowel movement this morning.   Hemodialysis yesterday. Tolerated treatment well. Hemodialysis for later today.   Objective:  Vital signs in last 24 hours:  Temp:  [97.4 F (36.3 C)-99.3 F (37.4 C)] 98 F (36.7 C) (05/15 1215) Pulse Rate:  [77-100] 78 (05/15 1215) Resp:  [14-20] 20 (05/15 1215) BP: (80-151)/(27-52) 125/36 (05/15 1215) SpO2:  [90 %-100 %] 100 % (05/15 1215) Weight:  [45.1 kg (99 lb 6.8 oz)] 45.1 kg (99 lb 6.8 oz) (05/14 1225)  Weight change: 0.24 kg (8.5 oz) Filed Weights   05/17/17 1001 05/18/17 0945 05/18/17 1225  Weight: 45.4 kg (100 lb) 45.6 kg (100 lb 8.5 oz) 45.1 kg (99 lb 6.8 oz)    Intake/Output: I/O last 3 completed shifts: In: 1197 [P.O.:477; Blood:420; IV Piggyback:300] Out: 500 [Other:500]   Intake/Output this shift:  Total I/O In: 347.3 [I.V.:17.3; Blood:330] Out: 0   Physical Exam: General: NAD,   Head: Normocephalic, atraumatic. Moist oral mucosal membranes  Eyes: Anicteric, PERRL  Neck: Supple, trachea midline  Lungs:  Clear to auscultation  Heart: Regular rate and rhythm  Abdomen:  Soft, nontender,   Extremities: no peripheral edema.  Neurologic: Alert to self and place  Skin: No lesions  Access: Left AVF    Basic Metabolic Panel: Recent Labs  Lab 05/17/17 1015 05/18/17 0512  NA 130* 132*  K 4.9 5.2*  CL 94* 96*  CO2 28 26  GLUCOSE 163* 95  BUN 55* 67*  CREATININE 4.87* 5.37*  CALCIUM 8.1* 7.7*    Liver Function Tests: Recent Labs  Lab 05/17/17 1015  AST 25  ALT 9*  ALKPHOS 143*  BILITOT 0.7  PROT 6.0*  ALBUMIN 2.4*   No results for input(s): LIPASE, AMYLASE in the last 168 hours. No results for input(s): AMMONIA in the last 168 hours.  CBC: Recent Labs  Lab 05/17/17 1015  05/17/17 2207 05/18/17 0512 05/18/17 1417 05/19/17 0015 05/19/17 0646  WBC 12.6*  --   --  11.6*  --  11.1*  --   HGB 9.0*    < > 8.7* 7.8* 7.7* 7.3* 6.4*  HCT 27.3*  --   --  23.1*  --  21.2* 18.2*  MCV 101.7*  --   --  92.9  --  92.9  --   PLT 271  --   --  198  --  224  --    < > = values in this interval not displayed.    Cardiac Enzymes: Recent Labs  Lab 05/17/17 1015  TROPONINI 0.04*    BNP: Invalid input(s): POCBNP  CBG: Recent Labs  Lab 05/18/17 1215 05/18/17 1622 05/18/17 2125 05/19/17 0730 05/19/17 1127  GLUCAP 79 138* 174* 126* 132*    Microbiology: Results for orders placed or performed during the hospital encounter of 05/17/17  Blood culture (routine x 2)     Status: None (Preliminary result)   Collection Time: 05/17/17 11:16 AM  Result Value Ref Range Status   Specimen Description BLOOD BLOOD RIGHT HAND  Final   Special Requests   Final    BOTTLES DRAWN AEROBIC AND ANAEROBIC Blood Culture adequate volume   Culture   Final    NO GROWTH 2 DAYS Performed at [facility], [address], [place], [place] [address]    Report Status PENDING  Incomplete  Blood culture (routine x 2)     Status:  None (Preliminary result)   Collection Time: 05/17/17 11:16 AM  Result Value Ref Range Status   Specimen Description BLOOD FOREARM  Final   Special Requests   Final    BOTTLES DRAWN AEROBIC AND ANAEROBIC Blood Culture adequate volume   Culture   Final    NO GROWTH 2 DAYS Performed at [facility], [address], [place], [place] [address]    Report Status PENDING  Incomplete  C difficile quick scan w PCR reflex     Status: Abnormal   Collection Time: 05/17/17  6:25 PM  Result Value Ref Range Status   C Diff antigen POSITIVE (A) NEGATIVE Final   C Diff toxin POSITIVE (A) NEGATIVE Final   C Diff interpretation Toxin producing C. difficile detected.  Final    Comment: RESULT CALLED TO, READ BACK BY AND VERIFIED WITH: [name] @1952  05/17/17 AKT Performed at [facility], [address] [place],  [address]     Coagulation Studies: Recent  Labs    05/17/17 1138  LABPROT 14.4  INR 1.13    Urinalysis: No results for input(s): COLORURINE, LABSPEC, PHURINE, GLUCOSEU, HGBUR, BILIRUBINUR, KETONESUR, PROTEINUR, UROBILINOGEN, NITRITE, LEUKOCYTESUR in the last 72 hours.  Invalid input(s): APPERANCEUR    Imaging: Dg Chest 1 View  Result Date: 05/18/2017 CLINICAL DATA:  84 y/o  F; pneumonia. EXAM: CHEST  1 VIEW COMPARISON:  05/07/2017 chest radiograph FINDINGS: Stable cardiomegaly given projection and technique post CABG. Aortic atherosclerosis with calcification. Stable right larger than left pleural effusions and bibasilar opacities. No acute osseous abnormality is evident. IMPRESSION: 1. Stable right larger than left pleural effusions and basilar opacities which may represent associated atelectasis or pneumonia. 2. Aortic atherosclerosis. 3. Stable cardiomegaly. Electronically Signed   By: [name] M.D.   On: 05/18/2017 04:19     Medications:   . metronidazole Stopped (05/19/17 8657)  . pantoprazole (PROTONIX) IVPB    . pantoprozole (PROTONIX) infusion     . calcium carbonate  1 tablet Oral TID WC  . feeding supplement (ENSURE ENLIVE)  237 mL Oral TID BM  . folic acid  0.5 mg Oral Daily  . Ganciclovir  1 drop Right Eye QID  . insulin aspart  0-9 Units Subcutaneous TID WC  . levothyroxine  75 mcg Oral BH-q7a  . multivitamin  1 tablet Oral QHS  . [START ON 05/22/2017] pantoprazole  40 mg Intravenous Q12H  . pravastatin  40 mg Oral QPM  . vancomycin  500 mg Oral Q6H  . ascorbic acid  250 mg Oral BID   acetaminophen, albuterol, HYDROcodone-acetaminophen, ondansetron **OR** ondansetron (ZOFRAN) IV, polyethylene glycol, polyvinyl alcohol, senna-docusate  Assessment/ Plan:  [name] is a 84 y.o. white female with end stage renal disease on hemodialysis, coronary artery disease, history of bilateral knee replacement, aortic valve replacement 2009, diabetes, hypertension, hypothyroidism, peripheral  vascular disease, pulmonary hypertension, anxiety  [facility]/MWF  1. End Stage Renal Disease: with hyperkalemia -  resume MWF schedule. Dialysis for later today - Discontinue IV fluids  2. Hypertension: well controlled.  -  amlodipine and metoprolol  3. Anemia of chronic kidney disease: hemoglobin 6.6. Status post PRBC transfusion on 5/13.  - mircera as outpatient - EPO with MWF treatment inpatient.  - 2 Units PRBC scheduled for today.   4. Secondary Hyperparathyroidism:  - tums with meals.   5. C. Diff colitis: - PO vanco    LOS: 2 [name] 5/15/201912:16 PM

## 2017-05-19 NOTE — Progress Notes (Signed)
Cephas Darby, MD [address]  [place]  [place], [place] [address]  Main: [phone]  Fax: [phone] Pager: [phone]   Subjective: Hemoglobin dropped to 6.4 today and patient had 1 large episode of hematochezia associated with blood clots. She is receiving 2 units of PRBCs. She is tolerating full liquid diet and underwent hemodialysis yesterday   Objective: Vital signs in last 24 hours: Vitals:   05/19/17 1919 05/19/17 1930 05/19/17 1945 05/19/17 2000  BP: (!) 124/46 (!) 120/43 (!) 115/47 (!) 112/49  Pulse: 86 83 87 89  Resp: 12 16 20 16   Temp:      TempSrc:      SpO2: 95% 95% 93% 90%  Weight:      Height:       Weight change: 8.5 oz (0.24 kg)  Intake/Output Summary (Last 24 hours) at 05/19/2017 2010 Last data filed at 05/19/2017 1449 Gross per 24 hour  Intake 721.28 ml  Output 0 ml  Net 721.28 ml     Exam: Heart:: Regular rate and rhythm or S1S2 present Lungs: clear to auscultation Abdomen: soft, nontender, normal bowel sounds Rectal exam today revealed solid brown stool separately from dark red blood clots   Lab Results: CBC Latest Ref Rng & Units 05/19/2017 05/19/2017 05/18/2017  WBC 3.6 - 11.0 K/uL - 11.1(H) -  Hemoglobin 12.0 - 16.0 g/dL 6.4(L) 7.3(L) 7.7(L)  Hematocrit 35.0 - 47.0 % 18.2(L) 21.2(L) -  Platelets 150 - 440 K/uL - 224 -   BMP Latest Ref Rng & Units 05/18/2017 05/17/2017 05/07/2017  Glucose 65 - 99 mg/dL 95 163(H) 214(H)  BUN 6 - 20 mg/dL 67(H) 55(H) 57(H)  Creatinine 0.44 - 1.00 mg/dL 5.37(H) 4.87(H) 4.98(H)  Sodium 135 - 145 mmol/L 132(L) 130(L) 129(L)  Potassium 3.5 - 5.1 mmol/L 5.2(H) 4.9 4.6  Chloride 101 - 111 mmol/L 96(L) 94(L) 90(L)  CO2 22 - 32 mmol/L 26 28 26   Calcium 8.9 - 10.3 mg/dL 7.7(L) 8.1(L) 8.1(L)   Micro Results: Recent Results (from the past 240 hour(s))  Blood culture (routine x 2)     Status: None (Preliminary result)   Collection Time: 05/17/17 11:16 AM  Result Value Ref Range Status   Specimen  Description BLOOD BLOOD RIGHT HAND  Final   Special Requests   Final    BOTTLES DRAWN AEROBIC AND ANAEROBIC Blood Culture adequate volume   Culture   Final    NO GROWTH 2 DAYS Performed at [facility], [address], [place], [place] [address]    Report Status PENDING  Incomplete  Blood culture (routine x 2)     Status: None (Preliminary result)   Collection Time: 05/17/17 11:16 AM  Result Value Ref Range Status   Specimen Description BLOOD FOREARM  Final   Special Requests   Final    BOTTLES DRAWN AEROBIC AND ANAEROBIC Blood Culture adequate volume   Culture   Final    NO GROWTH 2 DAYS Performed at [facility], [address], [place], [place] [address]    Report Status PENDING  Incomplete  C difficile quick scan w PCR reflex     Status: Abnormal   Collection Time: 05/17/17  6:25 PM  Result Value Ref Range Status   C Diff antigen POSITIVE (A) NEGATIVE Final   C Diff toxin POSITIVE (A) NEGATIVE Final   C Diff interpretation Toxin producing C. difficile detected.  Final    Comment: RESULT CALLED TO, READ BACK BY AND VERIFIED WITH:  KIRSTEN JOYNER @1952  05/17/17 AKT Performed at [facility], [address] [place], [place] [address]    Studies/Results: Dg Chest 1 View  Result Date: 05/18/2017 CLINICAL DATA:  84 y/o  F; pneumonia. EXAM: CHEST  1 VIEW COMPARISON:  05/07/2017 chest radiograph FINDINGS: Stable cardiomegaly given projection and technique post CABG. Aortic atherosclerosis with calcification. Stable right larger than left pleural effusions and bibasilar opacities. No acute osseous abnormality is evident. IMPRESSION: 1. Stable right larger than left pleural effusions and basilar opacities which may represent associated atelectasis or pneumonia. 2. Aortic atherosclerosis. 3. Stable cardiomegaly. Electronically Signed   By: Kristine Garbe M.D.   On: 05/18/2017 04:19   Medications: I have reviewed the patient's current  medications. Scheduled Meds: . calcium carbonate  1 tablet Oral TID WC  . [START ON 05/21/2017] epoetin (EPOGEN/PROCRIT) injection  10,000 Units Intravenous Q M,W,F-HD  . feeding supplement (ENSURE ENLIVE)  237 mL Oral TID BM  . folic acid  0.5 mg Oral Daily  . Ganciclovir  1 drop Right Eye QID  . insulin aspart  0-9 Units Subcutaneous TID WC  . levothyroxine  75 mcg Oral BH-q7a  . multivitamin  1 tablet Oral QHS  . [START ON 05/22/2017] pantoprazole  40 mg Intravenous Q12H  . pravastatin  40 mg Oral QPM  . vancomycin  500 mg Oral Q6H  . ascorbic acid  250 mg Oral BID   Continuous Infusions: . metronidazole 500 mg (05/19/17 1543)   PRN Meds:.acetaminophen, albuterol, HYDROcodone-acetaminophen, ondansetron **OR** ondansetron (ZOFRAN) IV, polyethylene glycol, polyvinyl alcohol, senna-docusate   Assessment: Active Problems:   GIB (gastrointestinal bleeding)   Pressure injury of skin  C. difficile colitis  Ongoing hematochezia with drop in hemoglobin and hemodynamically significant GI bleed Solid brown stool with blood clots per rectum, highly suggestive of lower GI bleed. She may have background ischemic colitis or diverticular bleed in addition to C. difficile infection that is probably resulting in hematochezia. She is also receiving hemodialysis, at risk for ischemic colitis. Her last dose of Plavix was prior to admission which was 2 days ago, half-life is up to 5 days   Plan: - Nothing by mouth - Resuscitate to attain hemoglobin above 8 - Tagged RBC scan - Protonix IV twice a day  - Endoscopic evaluation based on the tagged RBC scan results. I discussed in length with patient about endoscopic procedures including EGD and colonoscopy and she is amenable to undergo. Patient's son wishes to be aggressive with her care, do everything possible as she was independent of ADLs up until recently  - Continue vancomycin and metronidazole for 14 days to treat C. difficile    LOS: 2 days    [name] 05/19/2017, 8:10 PM

## 2017-05-19 NOTE — Progress Notes (Signed)
Pre HD assessment    05/19/17 1845  Vital Signs  Temp 98.5 F (36.9 C)  Temp Source Oral  Pulse Rate 85  Pulse Rate Source Monitor  Resp 13  BP (!) 92/33  BP Location Left Leg  BP Method Automatic  Patient Position (if appropriate) Lying  Oxygen Therapy  SpO2 94 %  O2 Device Room Air  Pain Assessment  Pain Scale 0-10  Pain Score 0  Dialysis Weight  Weight 45.9 kg (101 lb 3.1 oz)  Type of Weight Pre-Dialysis  Time-Out for Hemodialysis  What Procedure? HD  Pt Identifiers(min of two) First/Last Name;MRN/Account#  Correct Site? Yes  Correct Side? Yes  Correct Procedure? Yes  Consents Verified? Yes  Rad Studies Available? [name]  Safety Precautions Reviewed? Yes  Engineer, civil (consulting) Number  (7A)  Station Number  (bedside 218, enteric precautions )  UF/Alarm Test Passed  Conductivity: Meter 13.8  Conductivity: Machine  13.9  pH 7.4  Reverse Osmosis 5705  Normal Saline Lot Number [id number]  Dialyzer Lot Number [id number]  Disposable Set Lot Number [id number]  Machine Temperature 97.7 F (36.5 C)  Musician and Audible Yes  Blood Lines Intact and Secured Yes  Pre Treatment Patient Checks  Vascular access used during treatment Fistula  Hepatitis B Surface Antigen Results Negative  Date Hepatitis B Surface Antigen Drawn 01/03/17  Hepatitis B Surface Antibody  (>10)  Date Hepatitis B Surface Antibody Drawn 01/03/17  Hemodialysis Consent Verified Yes  Hemodialysis Standing Orders Initiated Yes  ECG (Telemetry) Monitor On Yes  Prime Ordered Normal Saline  Length of  DialysisTreatment -hour(s) 3 Hour(s)  Dialyzer Elisio 17H NR  Dialysate 3K, 2.5 Ca  Dialysis Anticoagulant None  Dialysate Flow Ordered 600  Blood Flow Rate Ordered 400 mL/min  Ultrafiltration Goal 1 Liters  Pre Treatment Labs Renal panel;CBC (H&H)  Dialysis Blood Pressure Support Ordered Normal Saline  Education / Care Plan  Dialysis Education Provided Yes  Documented Education in Care Plan Yes   Fistula / Graft Left Upper arm Arteriovenous fistula  No Placement Date or Time found.   Orientation: Left  Access Location: Upper arm  Access Type: Arteriovenous fistula  Site Condition No complications  Fistula / Graft Assessment Present;Thrill;Bruit  Drainage Description None

## 2017-05-19 NOTE — Progress Notes (Signed)
Pt not on unit, went down for a scan. RN will call me when she returns so we can start dialysis.    05/19/17 1630  Hand-Off documentation  Report given to (Full Name) Stark Bray  Report received from (Full Name) Lissa Morales

## 2017-05-19 NOTE — Progress Notes (Signed)
HD tx start    05/19/17 1919  Vital Signs  Pulse Rate 86  Pulse Rate Source Monitor  Resp 12  BP (!) 124/46  BP Location Left Leg  BP Method Automatic  Patient Position (if appropriate) Lying  Oxygen Therapy  SpO2 95 %  O2 Device Room Air  During Hemodialysis Assessment  Blood Flow Rate (mL/min) 400 mL/min  Arterial Pressure (mmHg) -170 mmHg  Venous Pressure (mmHg) 150 mmHg  Transmembrane Pressure (mmHg) 60 mmHg  Ultrafiltration Rate (mL/min) 500 mL/min  Dialysate Flow Rate (mL/min) 600 ml/min  Conductivity: Machine  14.2  HD Safety Checks Performed Yes  Dialysis Fluid Bolus Normal Saline  Bolus Amount (mL) 250 mL  Intra-Hemodialysis Comments Tx initiated  Fistula / Graft Left Upper arm Arteriovenous fistula  No Placement Date or Time found.   Orientation: Left  Access Location: Upper arm  Access Type: Arteriovenous fistula  Status Accessed  Needle Size 15

## 2017-05-19 NOTE — Progress Notes (Signed)
Patient passed large loose stool with large clots of blood. Notified Dr Jodell Cipro and he ordered H&H.

## 2017-05-20 LAB — TYPE AND SCREEN
ABO/RH(D): O POS
ANTIBODY SCREEN: NEGATIVE
UNIT DIVISION: 0
Unit division: 0
Unit division: 0

## 2017-05-20 LAB — CBC
HEMATOCRIT: 24 % — AB (ref 35.0–47.0)
Hemoglobin: 8.3 g/dL — ABNORMAL LOW (ref 12.0–16.0)
MCH: 31.5 pg (ref 26.0–34.0)
MCHC: 34.8 g/dL (ref 32.0–36.0)
MCV: 90.5 fL (ref 80.0–100.0)
PLATELETS: 221 10*3/uL (ref 150–440)
RBC: 2.65 MIL/uL — ABNORMAL LOW (ref 3.80–5.20)
RDW: 16.7 % — ABNORMAL HIGH (ref 11.5–14.5)
WBC: 10 10*3/uL (ref 3.6–11.0)

## 2017-05-20 LAB — RENAL FUNCTION PANEL
ALBUMIN: 2.3 g/dL — AB (ref 3.5–5.0)
Anion gap: 6 (ref 5–15)
BUN: 12 mg/dL (ref 6–20)
CO2: 29 mmol/L (ref 22–32)
CREATININE: 2.31 mg/dL — AB (ref 0.44–1.00)
Calcium: 7.8 mg/dL — ABNORMAL LOW (ref 8.9–10.3)
Chloride: 102 mmol/L (ref 101–111)
GFR calc Af Amer: 20 mL/min — ABNORMAL LOW (ref >60)
GFR, EST NON AFRICAN AMERICAN: 17 mL/min — AB (ref >60)
Glucose, Bld: 158 mg/dL — ABNORMAL HIGH (ref 65–99)
PHOSPHORUS: 2.7 mg/dL (ref 2.5–4.6)
Potassium: 3.7 mmol/L (ref 3.5–5.1)
Sodium: 137 mmol/L (ref 135–145)

## 2017-05-20 LAB — GLUCOSE, CAPILLARY
GLUCOSE-CAPILLARY: 91 mg/dL (ref 65–99)
GLUCOSE-CAPILLARY: 129 mg/dL — AB (ref 65–99)
GLUCOSE-CAPILLARY: 162 mg/dL — AB (ref 65–99)
Glucose-Capillary: 150 mg/dL — ABNORMAL HIGH (ref 65–99)
Glucose-Capillary: 124 mg/dL — ABNORMAL HIGH (ref 65–99)

## 2017-05-20 LAB — BPAM RBC
BLOOD PRODUCT EXPIRATION DATE: 201906062359
Blood Product Expiration Date: 201906062359
Blood Product Expiration Date: 201906062359
ISSUE DATE / TIME: 201905131619
ISSUE DATE / TIME: 201905150846
ISSUE DATE / TIME: 201905151224
UNIT TYPE AND RH: 5100
UNIT TYPE AND RH: 5100
Unit Type and Rh: 5100

## 2017-05-20 MED ORDER — PANTOPRAZOLE SODIUM 40 MG IV SOLR
40.0000 mg | Freq: Two times a day (BID) | INTRAVENOUS | Status: DC
  Administered 2017-05-20 – 2017-05-23 (×7): 40 mg via INTRAVENOUS
  Filled 2017-05-20 (×9): qty 40

## 2017-05-20 MED ORDER — MINERAL OIL RE ENEM
1.0000 | ENEMA | Freq: Once | RECTAL | Status: AC
  Administered 2017-05-20: 1 via RECTAL

## 2017-05-20 MED ORDER — METOPROLOL TARTRATE 25 MG PO TABS
25.0000 mg | ORAL_TABLET | Freq: Two times a day (BID) | ORAL | Status: DC
  Administered 2017-05-20 – 2017-05-23 (×7): 25 mg via ORAL
  Filled 2017-05-20 (×7): qty 1

## 2017-05-20 NOTE — Progress Notes (Signed)
Patient ID: [name], female   DOB: [birth year], 84 y.o.   MRN: [id number]  [employer] PROGRESS NOTE  [name] [id number]:[id number] DOB: 18-May-1920 DOA: 05/17/2017 PCP: [name], MD  HPI/Subjective: Patient doing better today than yesterday.  Answer some more questions.  Does not offer any complaints.  Objective: Vitals:   05/20/17 0524 05/20/17 1234  BP: (!) 143/45 (!) 134/44  Pulse: 84 85  Resp:  20  Temp: 98.6 F (37 C) 97.8 F (36.6 C)  SpO2: 100% 100%    Filed Weights   05/18/17 1225 05/19/17 1845 05/19/17 2243  Weight: 45.1 kg (99 lb 6.8 oz) 45.9 kg (101 lb 3.1 oz) 48.7 kg (107 lb 5.8 oz)    ROS: Review of Systems  Unable to perform ROS: Acuity of condition  Constitutional: Negative for chills and fever.  HENT: Positive for hearing loss.   Respiratory: Negative for shortness of breath.   Cardiovascular: Negative for chest pain.  Gastrointestinal: Positive for blood in stool. Negative for abdominal pain, nausea and vomiting.  Genitourinary: Negative for dysuria.  Musculoskeletal: Negative for joint pain.  Neurological: Negative for dizziness.   Exam: Physical Exam  HENT:  Nose: No mucosal edema.  Mouth/Throat: No oropharyngeal exudate or posterior oropharyngeal edema.  Eyes: Pupils are equal, round, and reactive to light. Conjunctivae, EOM and lids are normal.  Neck: No JVD present. Carotid bruit is not present. No edema present. No thyroid mass and no thyromegaly present.  Cardiovascular: S1 normal and S2 normal. Exam reveals no gallop.  No murmur heard. Pulses:      Dorsalis pedis pulses are 2+ on the right side, and 2+ on the left side.  Respiratory: No respiratory distress. She has decreased breath sounds in the right lower field and the left lower field. She has no wheezes. She has rhonchi in the right lower field and the left lower field. She has no rales.  GI: Soft. Bowel sounds are normal. There is no tenderness.  Musculoskeletal:   Right ankle: She exhibits no swelling.       Left ankle: She exhibits no swelling.  Lymphadenopathy:    She has no cervical adenopathy.  Neurological: She is alert.  Skin: Skin is warm. Nails show no clubbing.  Psychiatric: She has a normal mood and affect.      Data Reviewed: Basic Metabolic Panel: Recent Labs  Lab 05/17/17 1015 05/18/17 0512 05/20/17 0910  NA 130* 132* 137  K 4.9 5.2* 3.7  CL 94* 96* 102  CO2 28 26 29   GLUCOSE 163* 95 158*  BUN 55* 67* 12  CREATININE 4.87* 5.37* 2.31*  CALCIUM 8.1* 7.7* 7.8*  PHOS  --   --  2.7   Liver Function Tests: Recent Labs  Lab 05/17/17 1015 05/20/17 0910  AST 25  --   ALT 9*  --   ALKPHOS 143*  --   BILITOT 0.7  --   PROT 6.0*  --   ALBUMIN 2.4* 2.3*    CBC: Recent Labs  Lab 05/17/17 1015  05/18/17 0512 05/18/17 1417 05/19/17 0015 05/19/17 0646 05/19/17 2041 05/20/17 0910  WBC 12.6*  --  11.6*  --  11.1*  --   --  10.0  HGB 9.0*   < > 7.8* 7.7* 7.3* 6.4* 9.2* 8.3*  HCT 27.3*  --  23.1*  --  21.2* 18.2* 25.4* 24.0*  MCV 101.7*  --  92.9  --  92.9  --   --  90.5  PLT 271  --  198  --  224  --   --  221   < > = values in this interval not displayed.   Cardiac Enzymes: Recent Labs  Lab 05/17/17 1015  TROPONINI 0.04*    CBG: Recent Labs  Lab 05/19/17 0730 05/19/17 1127 05/19/17 2125 05/20/17 0736 05/20/17 1133  GLUCAP 126* 132* 91 129* 150*    Recent Results (from the past 240 hour(s))  Blood culture (routine x 2)     Status: None (Preliminary result)   Collection Time: 05/17/17 11:16 AM  Result Value Ref Range Status   Specimen Description BLOOD BLOOD RIGHT HAND  Final   Special Requests   Final    BOTTLES DRAWN AEROBIC AND ANAEROBIC Blood Culture adequate volume   Culture   Final    NO GROWTH 3 DAYS Performed at [facility], [address], [place], [place] [address]    Report Status PENDING  Incomplete  Blood culture (routine x 2)     Status: None (Preliminary result)    Collection Time: 05/17/17 11:16 AM  Result Value Ref Range Status   Specimen Description BLOOD FOREARM  Final   Special Requests   Final    BOTTLES DRAWN AEROBIC AND ANAEROBIC Blood Culture adequate volume   Culture   Final    NO GROWTH 3 DAYS Performed at [facility], [address] [place], [place] [address]    Report Status PENDING  Incomplete  C difficile quick scan w PCR reflex     Status: Abnormal   Collection Time: 05/17/17  6:25 PM  Result Value Ref Range Status   C Diff antigen POSITIVE (A) NEGATIVE Final   C Diff toxin POSITIVE (A) NEGATIVE Final   C Diff interpretation Toxin producing C. difficile detected.  Final    Comment: RESULT CALLED TO, READ BACK BY AND VERIFIED WITH: KIRSTEN JOYNER @1952  05/17/17 AKT Performed at [facility], [address] [place],  [address]      Studies: Nm Gi Blood Loss  Result Date: 05/19/2017 CLINICAL DATA:  GI bleed. EXAM: NUCLEAR MEDICINE GASTROINTESTINAL BLEEDING SCAN TECHNIQUE: Sequential abdominal images were obtained following intravenous administration of Tc-63m labeled red blood cells. RADIOPHARMACEUTICALS:  25.6 mCi Tc-79m pertechnetate in-vitro labeled red cells. COMPARISON:  CT of the abdomen and pelvis on 05/05/2017 FINDINGS: Imaging was performed over 2 hours. There is significant retained activity in the heart likely reflecting poor cardiac output. There is some visualization of free pertechnetate in the stomach. No active bleeding was identified from the gastrointestinal tract over 2 hours of imaging. IMPRESSION: No active bleeding identified from the gastrointestinal tract. Significant retained cardiac activity likely reflects poor cardiac output. Electronically Signed   By: Aletta Edouard M.D.   On: 05/19/2017 21:05   Dg Chest Port 1 View  Result Date: 05/19/2017 CLINICAL DATA:  Cough EXAM: PORTABLE CHEST 1 VIEW COMPARISON:  05/18/2017, 05/17/2017, 01/03/2017 FINDINGS: Post sternotomy changes.  Cardiomegaly with vascular congestion. Persistent small right greater than left pleural effusions. Partial but incomplete clearing of right mid lung infiltrates. Aortic atherosclerosis. No pneumothorax. Vascular stent in the left subclavian region IMPRESSION: 1. Partial but incomplete clearing of right pulmonary infiltrates or edema. 2. Persistent small right greater than left pleural effusion. 3. Cardiomegaly with vascular congestion and mild edema Electronically Signed   By: Donavan Foil M.D.   On: 05/19/2017 23:55    Scheduled Meds: . calcium carbonate  1 tablet Oral TID WC  . [START ON  05/21/2017] epoetin (EPOGEN/PROCRIT) injection  10,000 Units Intravenous Q M,W,F-HD  . feeding supplement (ENSURE ENLIVE)  237 mL Oral TID BM  . folic acid  0.5 mg Oral Daily  . Ganciclovir  1 drop Right Eye QID  . insulin aspart  0-9 Units Subcutaneous TID WC  . levothyroxine  75 mcg Oral BH-q7a  . metoprolol tartrate  25 mg Oral BID  . multivitamin  1 tablet Oral QHS  . pantoprazole (PROTONIX) IV  40 mg Intravenous Q12H  . polyethylene glycol-electrolytes  4,000 mL Oral Once  . pravastatin  40 mg Oral QPM  . vancomycin  500 mg Oral Q6H  . ascorbic acid  250 mg Oral BID   Continuous Infusions: . metronidazole Stopped (05/20/17 1043)    Assessment/Plan:  1. Acute blood loss anemia with burgundy looking stool.  Could still be diverticular bleed versus ischemic colitis versus upper GI bleed.  Started on Protonix drip.  GI considering colonoscopy today.  Continue medications for C. difficile.  Transfused 2 units of packed red blood cells yesterday with good response.  Continue to hold aspirin and Plavix. 2. Hypotension.  Resolved after getting blood yesterday.   3. C. difficile colitis.   patient on oral vancomycin and IV Flagyl. 4. Transient atrial fibrillation converted to normal sinus rhythm.  Restart metoprolol since blood pressure is better. 5. Hyperlipidemia on pravastatin 6. End-stage renal  disease. had dialysis yesterday evening. 7. Hypothyroidism unspecified on levothyroxine 8. History of CAD.  Unable to give blood thinner at this time.  Code Status:     Code Status Orders  (From admission, onward)        Start     Ordered   05/17/17 1347  Do not attempt resuscitation (DNR)  Continuous    Question Answer Comment  In the event of cardiac or respiratory ARREST Do not call a "code blue"   In the event of cardiac or respiratory ARREST Do not perform Intubation, CPR, defibrillation or ACLS   In the event of cardiac or respiratory ARREST Use medication by any route, position, wound care, and other measures to relive pain and suffering. May use oxygen, suction and manual treatment of airway obstruction as needed for comfort.      05/17/17 1346    Code Status History    Date Active Date Inactive Code Status Order ID Comments User Context   05/05/2017 1547 05/07/2017 2121 DNR [id number]  Hillary Bow, MD ED   04/26/2017 1709 04/28/2017 2046 DNR [id number]  Demetrios Loll, MD Inpatient   01/03/2017 1328 01/08/2017 2014 DNR [id number]  Idelle Crouch, MD Inpatient   01/03/2017 1031 01/03/2017 1328 DNR [id number]  Merlyn Lot, MD ED   09/18/2015 1817 09/22/2015 1920 DNR [id number]  Fritzi Mandes, MD Inpatient   09/18/2015 1516 09/18/2015 1817 DNR [id number]  Flora Lipps, MD ED   03/22/2013 2100 03/26/2013 1540 Full Code [id number]  Rise Patience, MD Inpatient   11/20/2012 1756 12/06/2012 2008 DNR [id number]  Annita Brod, MD Inpatient   06/18/2011 1042 06/19/2011 1401 Full Code [id number]  Paulene Floor, RN Inpatient   12/14/2010 1124 12/29/2010 1543 DNR [id number]  Bonnielee Haff, MD Inpatient   12/13/2010 0113 12/14/2010 1123 Full Code [id number]  Sid Falcon, RN Inpatient   11/22/2010 0216 11/28/2010 2239 DNR [id number]  Viviano Simas, RN Inpatient    Advance Directive Documentation     Most Recent Value  Type of Advance Directive  Healthcare Power of [place],  Out of facility DNR (  pink MOST or yellow form), Living will  Pre-existing out of facility DNR order (yellow form or pink MOST form)  Pink MOST form placed in chart (order not valid for inpatient use)  "MOST" Form in Place?  -     Family Communication: spoke with son yesterday. Disposition Plan: Patient came from a rehab facility and will likely be going back unless the patient gets stronger while here.  Continue physical therapy on a daily basis to try to get this patient stronger.    Consultants:  Gastroenterology  Nephrology  Antibiotics:  oral vancomycin  IV Flagyl  Time spent:  26 minutes. Case discussed with nephrology.  Paloma Grange [employer]

## 2017-05-20 NOTE — Progress Notes (Signed)
Pt had HD at th bedside. Throughout HD Pt. SAT's and HR irregular with O2 in the 80's and HR in the 30's. Pt started HD on room air but had to place O2 up to 4L.to keep SATs in the 62's MD notified and assessed Pt at the bedside. Chest xray, EKG and ABG's and TELE orderd and completed. MD notifeid of results. Pt. O2 and HR stable on TELE during early morning shift.

## 2017-05-20 NOTE — Plan of Care (Signed)
Pt prepping for endoscopy. No signs of bleeding today. Pt up to chair with PT. Tolerating clear liquids. Significant pain in fractured hip when moving and working with PT ,pain controlled by prn norco.

## 2017-05-20 NOTE — Progress Notes (Signed)
[facility]  ROUNDING NOTE   Subjective:   Hemodialysis yesterday. 2 units PRBC yesterday. Tolerated transfusion and hemodialysis treatment yesterday   UF of 72mL  Objective:  Vital signs in last 24 hours:  Temp:  [97.8 F (36.6 C)-98.6 F (37 C)] 97.8 F (36.6 C) (05/16 1234) Pulse Rate:  [56-97] 85 (05/16 1234) Resp:  [12-22] 20 (05/16 1234) BP: (81-155)/(33-74) 134/44 (05/16 1234) SpO2:  [89 %-100 %] 100 % (05/16 1234) Weight:  [45.9 kg (101 lb 3.1 oz)-48.7 kg (107 lb 5.8 oz)] 48.7 kg (107 lb 5.8 oz) (05/15 2243)  Weight change: 0.3 kg (10.6 oz) Filed Weights   05/18/17 1225 05/19/17 1845 05/19/17 2243  Weight: 45.1 kg (99 lb 6.8 oz) 45.9 kg (101 lb 3.1 oz) 48.7 kg (107 lb 5.8 oz)    Intake/Output: I/O last 3 completed shifts: In: 841.3 [P.O.:120; I.V.:17.3; Blood:604; IV Piggyback:100] Out: 767 [Other:767]   Intake/Output this shift:  Total I/O In: 400 [IV Piggyback:400] Out: 0   Physical Exam: General: NAD, laying in bed  Head: Normocephalic, atraumatic. Moist oral mucosal membranes  Eyes: Anicteric, PERRL  Neck: Supple, trachea midline  Lungs:  Clear to auscultation  Heart: Regular rate and rhythm  Abdomen:  Soft, nontender,   Extremities: no peripheral edema.  Neurologic: Alert to self and place  Skin: No lesions  Access: Left AVF    Basic Metabolic Panel: Recent Labs  Lab 05/17/17 1015 05/18/17 0512 05/20/17 0910  NA 130* 132* 137  K 4.9 5.2* 3.7  CL 94* 96* 102  CO2 28 26 29   GLUCOSE 163* 95 158*  BUN 55* 67* 12  CREATININE 4.87* 5.37* 2.31*  CALCIUM 8.1* 7.7* 7.8*  PHOS  --   --  2.7    Liver Function Tests: Recent Labs  Lab 05/17/17 1015 05/20/17 0910  AST 25  --   ALT 9*  --   ALKPHOS 143*  --   BILITOT 0.7  --   PROT 6.0*  --   ALBUMIN 2.4* 2.3*   No results for input(s): LIPASE, AMYLASE in the last 168 hours. No results for input(s): AMMONIA in the last 168 hours.  CBC: Recent Labs  Lab 05/17/17 1015   05/18/17 0512 05/18/17 1417 05/19/17 0015 05/19/17 0646 05/19/17 2041 05/20/17 0910  WBC 12.6*  --  11.6*  --  11.1*  --   --  10.0  HGB 9.0*   < > 7.8* 7.7* 7.3* 6.4* 9.2* 8.3*  HCT 27.3*  --  23.1*  --  21.2* 18.2* 25.4* 24.0*  MCV 101.7*  --  92.9  --  92.9  --   --  90.5  PLT 271  --  198  --  224  --   --  221   < > = values in this interval not displayed.    Cardiac Enzymes: Recent Labs  Lab 05/17/17 1015  TROPONINI 0.04*    BNP: Invalid input(s): POCBNP  CBG: Recent Labs  Lab 05/19/17 0730 05/19/17 1127 05/19/17 2125 05/20/17 0736 05/20/17 1133  GLUCAP 126* 132* 91 129* 150*    Microbiology: Results for orders placed or performed during the hospital encounter of 05/17/17  Blood culture (routine x 2)     Status: None (Preliminary result)   Collection Time: 05/17/17 11:16 AM  Result Value Ref Range Status   Specimen Description BLOOD BLOOD RIGHT HAND  Final   Special Requests   Final    BOTTLES DRAWN AEROBIC AND ANAEROBIC Blood Culture adequate  volume   Culture   Final    NO GROWTH 3 DAYS Performed at [facility], [address] [place], [place] [address]    Report Status PENDING  Incomplete  Blood culture (routine x 2)     Status: None (Preliminary result)   Collection Time: 05/17/17 11:16 AM  Result Value Ref Range Status   Specimen Description BLOOD FOREARM  Final   Special Requests   Final    BOTTLES DRAWN AEROBIC AND ANAEROBIC Blood Culture adequate volume   Culture   Final    NO GROWTH 3 DAYS Performed at [facility], [address], [place], [place] [address]    Report Status PENDING  Incomplete  C difficile quick scan w PCR reflex     Status: Abnormal   Collection Time: 05/17/17  6:25 PM  Result Value Ref Range Status   C Diff antigen POSITIVE (A) NEGATIVE Final   C Diff toxin POSITIVE (A) NEGATIVE Final   C Diff interpretation Toxin producing C. difficile detected.  Final    Comment: RESULT CALLED TO, READ BACK BY  AND VERIFIED WITH: KIRSTEN JOYNER @1952  05/17/17 AKT Performed at [facility], [address] [place], [place] [address]     Coagulation Studies: No results for input(s): LABPROT, INR in the last 72 hours.  Urinalysis: No results for input(s): COLORURINE, LABSPEC, PHURINE, GLUCOSEU, HGBUR, BILIRUBINUR, KETONESUR, PROTEINUR, UROBILINOGEN, NITRITE, LEUKOCYTESUR in the last 72 hours.  Invalid input(s): APPERANCEUR    Imaging: Nm Gi Blood Loss  Result Date: 05/19/2017 CLINICAL DATA:  GI bleed. EXAM: NUCLEAR MEDICINE GASTROINTESTINAL BLEEDING SCAN TECHNIQUE: Sequential abdominal images were obtained following intravenous administration of Tc-49m labeled red blood cells. RADIOPHARMACEUTICALS:  25.6 mCi Tc-57m pertechnetate in-vitro labeled red cells. COMPARISON:  CT of the abdomen and pelvis on 05/05/2017 FINDINGS: Imaging was performed over 2 hours. There is significant retained activity in the heart likely reflecting poor cardiac output. There is some visualization of free pertechnetate in the stomach. No active bleeding was identified from the gastrointestinal tract over 2 hours of imaging. IMPRESSION: No active bleeding identified from the gastrointestinal tract. Significant retained cardiac activity likely reflects poor cardiac output. Electronically Signed   By: Aletta Edouard M.D.   On: 05/19/2017 21:05   Dg Chest Port 1 View  Result Date: 05/19/2017 CLINICAL DATA:  Cough EXAM: PORTABLE CHEST 1 VIEW COMPARISON:  05/18/2017, 05/17/2017, 01/03/2017 FINDINGS: Post sternotomy changes. Cardiomegaly with vascular congestion. Persistent small right greater than left pleural effusions. Partial but incomplete clearing of right mid lung infiltrates. Aortic atherosclerosis. No pneumothorax. Vascular stent in the left subclavian region IMPRESSION: 1. Partial but incomplete clearing of right pulmonary infiltrates or edema. 2. Persistent small right greater than left pleural effusion. 3.  Cardiomegaly with vascular congestion and mild edema Electronically Signed   By: Donavan Foil M.D.   On: 05/19/2017 23:55     Medications:   . metronidazole Stopped (05/20/17 1043)   . calcium carbonate  1 tablet Oral TID WC  . [START ON 05/21/2017] epoetin (EPOGEN/PROCRIT) injection  10,000 Units Intravenous Q M,W,F-HD  . feeding supplement (ENSURE ENLIVE)  237 mL Oral TID BM  . folic acid  0.5 mg Oral Daily  . Ganciclovir  1 drop Right Eye QID  . insulin aspart  0-9 Units Subcutaneous TID WC  . levothyroxine  75 mcg Oral BH-q7a  . metoprolol tartrate  25 mg Oral BID  . multivitamin  1 tablet Oral QHS  . pantoprazole (PROTONIX) IV  40 mg Intravenous Q12H  . polyethylene glycol-electrolytes  4,000 mL Oral Once  . pravastatin  40 mg Oral QPM  . vancomycin  500 mg Oral Q6H  . ascorbic acid  250 mg Oral BID   acetaminophen, albuterol, HYDROcodone-acetaminophen, ondansetron **OR** ondansetron (ZOFRAN) IV, polyethylene glycol, polyvinyl alcohol, senna-docusate  Assessment/ Plan:  [name] is a 84 y.o. white female with end stage renal disease on hemodialysis, coronary artery disease, history of bilateral knee replacement, aortic valve replacement 2009, diabetes, hypertension, hypothyroidism, peripheral vascular disease, pulmonary hypertension, anxiety  [facility]/MWF  1. End Stage Renal Disease: with hyperkalemia -  Continue MWF schedule. Dialysis for tomorrow.   2. Hypertension: well controlled.  -  amlodipine and metoprolol  3. Anemia of chronic kidney disease:  Status post PRBC transfusions on 5/13 and 5/15.  - mircera as outpatient - EPO with MWF treatment inpatient.   4. Secondary Hyperparathyroidism:  - tums with meals.   5. C. Diff colitis: - PO vanco and metronidazole.    LOS: 3 Rainn Bullinger 5/16/201912:48 PM

## 2017-05-20 NOTE — Progress Notes (Signed)
Cephas Darby, MD [address]  [place]  [place], [place] [address]  Main: [phone]  Fax: [phone] Pager: [phone]   Subjective: Patient is feeling better today. No active episodes of hematochezia today. She drank half the amount of GoLYTELY. Hemoglobin responded appropriately to 2 units of blood yesterday. She is on clear liquid diet. According to her nurse, she had small smears of dark maroon liquid bowel movements with tiny clots  Objective: Vital signs in last 24 hours: Vitals:   05/19/17 2227 05/19/17 2243 05/20/17 0524 05/20/17 1234  BP: (!) 132/51 (!) 155/48 (!) 143/45 (!) 134/44  Pulse: 90 94 84 85  Resp: 15 14  20   Temp:  98.4 F (36.9 C) 98.6 F (37 C) 97.8 F (36.6 C)  TempSrc:  Axillary Oral Oral  SpO2: 92% 91% 100% 100%  Weight:  107 lb 5.8 oz (48.7 kg)    Height:       Weight change: 10.6 oz (0.3 kg)  Intake/Output Summary (Last 24 hours) at 05/20/2017 1939 Last data filed at 05/20/2017 1600 Gross per 24 hour  Intake 400 ml  Output 767 ml  Net -367 ml     Exam: Heart:: Regular rate and rhythm or S1S2 present Lungs: clear to auscultation Abdomen: soft, nontender, normal bowel sounds  Lab Results: CBC Latest Ref Rng & Units 05/20/2017 05/19/2017 05/19/2017  WBC 3.6 - 11.0 K/uL 10.0 - -  Hemoglobin 12.0 - 16.0 g/dL 8.3(L) 9.2(L) 6.4(L)  Hematocrit 35.0 - 47.0 % 24.0(L) 25.4(L) 18.2(L)  Platelets 150 - 440 K/uL 221 - -   BMP Latest Ref Rng & Units 05/20/2017 05/18/2017 05/17/2017  Glucose 65 - 99 mg/dL 158(H) 95 163(H)  BUN 6 - 20 mg/dL 12 67(H) 55(H)  Creatinine 0.44 - 1.00 mg/dL 2.31(H) 5.37(H) 4.87(H)  Sodium 135 - 145 mmol/L 137 132(L) 130(L)  Potassium 3.5 - 5.1 mmol/L 3.7 5.2(H) 4.9  Chloride 101 - 111 mmol/L 102 96(L) 94(L)  CO2 22 - 32 mmol/L 29 26 28   Calcium 8.9 - 10.3 mg/dL 7.8(L) 7.7(L) 8.1(L)   Micro Results: Recent Results (from the past 240 hour(s))  Blood culture (routine x 2)     Status: None (Preliminary  result)   Collection Time: 05/17/17 11:16 AM  Result Value Ref Range Status   Specimen Description BLOOD BLOOD RIGHT HAND  Final   Special Requests   Final    BOTTLES DRAWN AEROBIC AND ANAEROBIC Blood Culture adequate volume   Culture   Final    NO GROWTH 3 DAYS Performed at [facility], [address], [place], [place] [address]    Report Status PENDING  Incomplete  Blood culture (routine x 2)     Status: None (Preliminary result)   Collection Time: 05/17/17 11:16 AM  Result Value Ref Range Status   Specimen Description BLOOD FOREARM  Final   Special Requests   Final    BOTTLES DRAWN AEROBIC AND ANAEROBIC Blood Culture adequate volume   Culture   Final    NO GROWTH 3 DAYS Performed at [facility], [address], [place], [place] [address]    Report Status PENDING  Incomplete  C difficile quick scan w PCR reflex     Status: Abnormal   Collection Time: 05/17/17  6:25 PM  Result Value Ref Range Status   C Diff antigen POSITIVE (A) NEGATIVE Final   C Diff toxin POSITIVE (A) NEGATIVE Final   C Diff interpretation Toxin producing C. difficile detected.  Final    Comment: RESULT CALLED TO, READ BACK BY AND VERIFIED WITH: KIRSTEN JOYNER @1952  05/17/17 AKT Performed at [facility], [address] [place], [place] [address]    Studies/Results: Nm Gi Blood Loss  Result Date: 05/19/2017 CLINICAL DATA:  GI bleed. EXAM: NUCLEAR MEDICINE GASTROINTESTINAL BLEEDING SCAN TECHNIQUE: Sequential abdominal images were obtained following intravenous administration of Tc-69m labeled red blood cells. RADIOPHARMACEUTICALS:  25.6 mCi Tc-8m pertechnetate in-vitro labeled red cells. COMPARISON:  CT of the abdomen and pelvis on 05/05/2017 FINDINGS: Imaging was performed over 2 hours. There is significant retained activity in the heart likely reflecting poor cardiac output. There is some visualization of free pertechnetate in the stomach. No active bleeding was identified  from the gastrointestinal tract over 2 hours of imaging. IMPRESSION: No active bleeding identified from the gastrointestinal tract. Significant retained cardiac activity likely reflects poor cardiac output. Electronically Signed   By: Aletta Edouard M.D.   On: 05/19/2017 21:05   Dg Chest Port 1 View  Result Date: 05/19/2017 CLINICAL DATA:  Cough EXAM: PORTABLE CHEST 1 VIEW COMPARISON:  05/18/2017, 05/17/2017, 01/03/2017 FINDINGS: Post sternotomy changes. Cardiomegaly with vascular congestion. Persistent small right greater than left pleural effusions. Partial but incomplete clearing of right mid lung infiltrates. Aortic atherosclerosis. No pneumothorax. Vascular stent in the left subclavian region IMPRESSION: 1. Partial but incomplete clearing of right pulmonary infiltrates or edema. 2. Persistent small right greater than left pleural effusion. 3. Cardiomegaly with vascular congestion and mild edema Electronically Signed   By: Donavan Foil M.D.   On: 05/19/2017 23:55   Medications: I have reviewed the patient's current medications. Scheduled Meds: . calcium carbonate  1 tablet Oral TID WC  . [START ON 05/21/2017] epoetin (EPOGEN/PROCRIT) injection  10,000 Units Intravenous Q M,W,F-HD  . feeding supplement (ENSURE ENLIVE)  237 mL Oral TID BM  . folic acid  0.5 mg Oral Daily  . Ganciclovir  1 drop Right Eye QID  . insulin aspart  0-9 Units Subcutaneous TID WC  . levothyroxine  75 mcg Oral BH-q7a  . metoprolol tartrate  25 mg Oral BID  . multivitamin  1 tablet Oral QHS  . pantoprazole (PROTONIX) IV  40 mg Intravenous Q12H  . polyethylene glycol-electrolytes  4,000 mL Oral Once  . pravastatin  40 mg Oral QPM  . vancomycin  500 mg Oral Q6H  . ascorbic acid  250 mg Oral BID   Continuous Infusions: . metronidazole Stopped (05/20/17 1755)   PRN Meds:.acetaminophen, albuterol, HYDROcodone-acetaminophen, ondansetron **OR** ondansetron (ZOFRAN) IV, polyethylene glycol, polyvinyl alcohol,  senna-docusate   Assessment: Active Problems:   GIB (gastrointestinal bleeding)   Pressure injury of skin  C. difficile colitis  Ongoing hematochezia with drop in hemoglobin and hemodynamically significant GI bleed Solid brown stool with blood clots per rectum, highly suggestive of lower GI bleed. She may have background ischemic colitis or diverticular bleed in addition to C. difficile infection that is probably resulting in hematochezia. She is also receiving hemodialysis, at risk for ischemic colitis. Her last dose of Plavix was prior to admission on 05/17/2017, half-life is up to 5 days. Hematochezia has significantly improved. Tagged RBC scan yesterday came back negative for active GI bleeding source   Plan: - Continue clear liquid diet, NPO after breakfast - Transfuse as needed to maintain hemoglobin above 8 - Protonix IV twice a day  - Plan for EGD and colonoscopy tomorrow. Discussed with nurse to try to administer rest of the bowel prep tonight. I discussed in  length with patient and her family about endoscopic procedures including EGD and colonoscopy and she is amenable to undergo. Patient's son wishes to be aggressive with her care, do everything possible as she was independent of ADLs up until recently  - Continue vancomycin and metronidazole for 14 days to treat C. difficile   Dr. Alice Reichert to cover from tomorrow   LOS: 3 days   Rohini Vanga 05/20/2017, 7:39 PM

## 2017-05-20 NOTE — Evaluation (Signed)
Physical Therapy Evaluation Patient Details Name: [name] MRN: [id number] DOB: 1920/12/07 Today's Date: 05/20/2017   History of Present Illness  Pt is a 84 y/o F who with recent diverticular bleed who presented from [facility] due to bright red blood per rectum.  In early am of 5/16 the pt's vitals at HD were irregular with HR in the 30s and SpO2 in the 80s.  Pt with C-diff.  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      Clinical Impression  Pt admitted with above diagnosis. Pt currently with functional limitations due to the deficits listed below (see PT Problem List). Ms. Thomaston presented with impaired cognition (unsure of pt's baseline) but was very pleasant and agreeable to work with therapy.  Pt currently requires mod assist for bed mobility and sit<>stand transfer and min assist to ambulate 8 ft.  Pt is at a high risk of falling.  Given pt's current mobility status, recommending SNF at d/c.   Pt will benefit from skilled PT to increase their independence and safety with mobility to allow discharge to the venue listed below.      Follow Up Recommendations SNF    Equipment Recommendations  Other (comment)(TBD at next venue of care)    Recommendations for Other Services       Precautions / Restrictions Precautions Precautions: Fall;Other (comment) Precaution Comments: monitor vitals Restrictions Weight Bearing Restrictions: No      Mobility  Bed Mobility Overal bed mobility: Needs Assistance Bed Mobility: Supine to Sit     Supine to sit: Mod assist;HOB elevated     General bed mobility comments: Assist to advance LEs to EOB and assist to elevate trunk.    Transfers Overall transfer level: Needs assistance Equipment used: Rolling walker (2 wheeled) Transfers: Sit to/from Stand Sit to Stand: Mod assist         General transfer comment: Assist to boost to  standing and to remain steady.  Pt does not reach back for armrests when sitting.  Poor safety awareness.  Ambulation/Gait Ambulation/Gait assistance: Min assist Ambulation Distance (Feet): 8 Feet Assistive device: Rolling walker (2 wheeled) Gait Pattern/deviations: Decreased step length - right;Decreased step length - left;Trunk flexed Gait velocity: decreased   General Gait Details: Pt demonstrates flexed posture which improves temporarily with verbal cues.  Pt fatigues quickly and begins becoming more unsteady, thus returned to sitting.   Stairs            Wheelchair Mobility    Modified Rankin (Stroke Patients Only)       Balance Overall balance assessment: Needs assistance Sitting-balance support: Feet supported;Single extremity supported Sitting balance-Leahy Scale: Poor Sitting balance - Comments: Pt relies on at least 1UE support to maintain balance sitting EOB due to posterior lean Postural control: Posterior lean Standing balance support: Bilateral upper extremity supported;During functional activity Standing balance-Leahy Scale: Poor Standing balance comment: Pt relies on BUE support for static and dynamic activities                             Pertinent Vitals/Pain Pain Assessment: No/denies pain Faces Pain Scale: No hurt Pain Location: no signs of pain Pain Intervention(s): Monitored during session    Home Living Family/patient expects to be discharged to:: Skilled nursing facility  Additional Comments: [facility]  Information taken from prior evaluation on 01/06/17: Lives alone in one-level home with basement (houses laundry).  4 steps with L ascending rail to enter; full flight with rail to/from basement.  Son provides weekly check in/assist as needed.    Prior Function Level of Independence: Needs assistance         Comments: Pt unable to provide information regarding PLOF and no family present.  Based on pt's  current mobility level, assume that pt required assist with most ADLs and mobility.      Hand Dominance        Extremity/Trunk Assessment   Upper Extremity Assessment Upper Extremity Assessment: (BUE strength grossly 3-/5)    Lower Extremity Assessment Lower Extremity Assessment: (BLE strength grossly 3/5)    Cervical / Trunk Assessment Cervical / Trunk Assessment: Kyphotic  Communication   Communication: HOH  Cognition Arousal/Alertness: Lethargic;Awake/alert(Initially lethargic, becomes more alert with activity) Behavior During Therapy: WFL for tasks assessed/performed Overall Cognitive Status: No family/caregiver present to determine baseline cognitive functioning                                 General Comments: RN believes that pt has some cognitive deficits at baseline but no family present to confirm.  Pt oriented to self only.  Poor safety/deficit awareness.       General Comments General comments (skin integrity, edema, etc.): BP taken after pt sitting from ambulating 147/44.  SpO2 remains at or above 99% on 5L O2 throughout session.  HR in 80s at rest and in 90s with activity.      Exercises     Assessment/Plan    PT Assessment Patient needs continued PT services  PT Problem List Decreased strength;Decreased activity tolerance;Decreased balance;Decreased cognition;Decreased knowledge of use of DME;Decreased safety awareness;Cardiopulmonary status limiting activity       PT Treatment Interventions DME instruction;Gait training;Functional mobility training;Therapeutic activities;Therapeutic exercise;Balance training;Stair training;Neuromuscular re-education;Cognitive remediation;Patient/family education;Wheelchair mobility training;Modalities    PT Goals (Current goals can be found in the Care Plan section)  Acute Rehab PT Goals Patient Stated Goal: pt unable to state PT Goal Formulation: Patient unable to participate in goal setting Time For Goal  Achievement: 06/03/17 Potential to Achieve Goals: Good    Frequency Min 2X/week   Barriers to discharge Other (comment) Unsure of amount of assist available at d/c    Co-evaluation               AM-PAC PT "6 Clicks" Daily Activity  Outcome Measure Difficulty turning over in bed (including adjusting bedclothes, sheets and blankets)?: Unable Difficulty moving from lying on back to sitting on the side of the bed? : Unable Difficulty sitting down on and standing up from a chair with arms (e.g., wheelchair, bedside commode, etc,.)?: Unable Help needed moving to and from a bed to chair (including a wheelchair)?: A Little Help needed walking in hospital room?: A Lot Help needed climbing 3-5 steps with a railing? : Total 6 Click Score: 9    End of Session Equipment Utilized During Treatment: Gait belt Activity Tolerance: Patient limited by fatigue Patient left: in chair;with call bell/phone within reach;with chair alarm set Nurse Communication: Mobility status;Other (comment)(vitals) PT Visit Diagnosis: Muscle weakness (generalized) (M62.81);Unsteadiness on feet (R26.81);Other abnormalities of gait and mobility (R26.89);Difficulty in walking, not elsewhere classified (R26.2)    Time: 6301-6010 PT Time Calculation (min) (ACUTE ONLY): 26 min   Charges:  PT Evaluation $PT Eval Moderate Complexity: 1 Mod PT Treatments $Therapeutic Activity: 8-22 mins   PT G Codes:        Collie Siad PT, DPT 05/20/2017, 12:49 PM

## 2017-05-21 ENCOUNTER — Encounter
Admission: EM | Disposition: A | Discharge status: Skilled Nursing Facility | Payer: Self-pay | Source: Home / Self Care | Attending: Internal Medicine

## 2017-05-21 ENCOUNTER — Inpatient Hospital Stay: Payer: Medicare Other | Admitting: Anesthesiology

## 2017-05-21 ENCOUNTER — Encounter: Payer: Self-pay | Admitting: *Deleted

## 2017-05-21 ENCOUNTER — Inpatient Hospital Stay
Admission: EM | Disposition: A | Discharge status: Skilled Nursing Facility | Payer: Self-pay | Source: Home / Self Care | Attending: Internal Medicine

## 2017-05-21 HISTORY — PX: ESOPHAGOGASTRODUODENOSCOPY: SHX5428

## 2017-05-21 HISTORY — PX: COLONOSCOPY: SHX5424

## 2017-05-21 LAB — GLUCOSE, CAPILLARY
GLUCOSE-CAPILLARY: 95 mg/dL (ref 65–99)
GLUCOSE-CAPILLARY: 172 mg/dL — AB (ref 65–99)
GLUCOSE-CAPILLARY: 167 mg/dL — AB (ref 65–99)
Glucose-Capillary: 66 mg/dL (ref 65–99)

## 2017-05-21 LAB — CBC
HCT: 24 % — ABNORMAL LOW (ref 35.0–47.0)
HEMOGLOBIN: 8.3 g/dL — AB (ref 12.0–16.0)
MCH: 32.2 pg (ref 26.0–34.0)
MCHC: 34.7 g/dL (ref 32.0–36.0)
MCV: 92.9 fL (ref 80.0–100.0)
Platelets: 249 10*3/uL (ref 150–440)
RBC: 2.58 MIL/uL — ABNORMAL LOW (ref 3.80–5.20)
RDW: 17.5 % — AB (ref 11.5–14.5)
WBC: 7.7 10*3/uL (ref 3.6–11.0)

## 2017-05-21 LAB — RENAL FUNCTION PANEL
ANION GAP: 9 (ref 5–15)
Albumin: 2.4 g/dL — ABNORMAL LOW (ref 3.5–5.0)
BUN: 16 mg/dL (ref 6–20)
CHLORIDE: 101 mmol/L (ref 101–111)
CO2: 29 mmol/L (ref 22–32)
Calcium: 8.2 mg/dL — ABNORMAL LOW (ref 8.9–10.3)
Creatinine, Ser: 3.47 mg/dL — ABNORMAL HIGH (ref 0.44–1.00)
GFR calc Af Amer: 12 mL/min — ABNORMAL LOW (ref >60)
GFR calc non Af Amer: 10 mL/min — ABNORMAL LOW (ref >60)
GLUCOSE: 103 mg/dL — AB (ref 65–99)
POTASSIUM: 3.9 mmol/L (ref 3.5–5.1)
Phosphorus: 3 mg/dL (ref 2.5–4.6)
Sodium: 139 mmol/L (ref 135–145)

## 2017-05-21 LAB — HEMOGLOBIN: HEMOGLOBIN: 8.1 g/dL — AB (ref 12.0–16.0)

## 2017-05-21 SURGERY — EGD (ESOPHAGOGASTRODUODENOSCOPY)
Anesthesia: General

## 2017-05-21 SURGERY — COLONOSCOPY WITH PROPOFOL
Anesthesia: General

## 2017-05-21 MED ORDER — PROPOFOL 10 MG/ML IV BOLUS
INTRAVENOUS | Status: DC | PRN
  Administered 2017-05-21 (×2): 20 mg via INTRAVENOUS

## 2017-05-21 MED ORDER — LIDOCAINE HCL (PF) 2 % IJ SOLN
INTRAMUSCULAR | Status: DC | PRN
  Administered 2017-05-21: 60 mg via INTRADERMAL

## 2017-05-21 MED ORDER — PROPOFOL 500 MG/50ML IV EMUL
INTRAVENOUS | Status: DC | PRN
  Administered 2017-05-21: 40 ug/kg/min via INTRAVENOUS

## 2017-05-21 MED ORDER — SODIUM CHLORIDE 0.9 % IV SOLN
INTRAVENOUS | Status: DC
  Administered 2017-05-21: 15:00:00 via INTRAVENOUS

## 2017-05-21 NOTE — Progress Notes (Signed)
Patient ID: [name], female   DOB: 1920-12-25, 84 y.o.   MRN: [id number]  [employer] PROGRESS NOTE  [name]:[id number] DOB: 03-19-1920 DOA: 05/17/2017 PCP: [name], MD  HPI/Subjective: Patient asking when we are going to find out where the bleeding is coming from.  No abdominal pain.  As per nursing staff they did not see any further bleeding.  Objective: Vitals:   05/21/17 1222 05/21/17 1403  BP: (!) 154/47 (!) 156/65  Pulse: 84 88  Resp: 16 16  Temp: 98.7 F (37.1 C) 97.7 F (36.5 C)  SpO2: 100% 100%    Filed Weights   05/18/17 1225 05/19/17 1845 05/19/17 2243  Weight: 45.1 kg (99 lb 6.8 oz) 45.9 kg (101 lb 3.1 oz) 48.7 kg (107 lb 5.8 oz)    ROS: Review of Systems  Constitutional: Negative for chills and fever.  HENT: Positive for hearing loss.   Respiratory: Negative for shortness of breath.   Cardiovascular: Negative for chest pain.  Gastrointestinal: Negative for abdominal pain, nausea and vomiting.  Genitourinary: Negative for dysuria.  Musculoskeletal: Negative for joint pain.  Neurological: Negative for dizziness.   Exam: Physical Exam  HENT:  Nose: No mucosal edema.  Mouth/Throat: No oropharyngeal exudate or posterior oropharyngeal edema.  Eyes: Pupils are equal, round, and reactive to light. Conjunctivae, EOM and lids are normal.  Neck: No JVD present. Carotid bruit is not present. No edema present. No thyroid mass and no thyromegaly present.  Cardiovascular: S1 normal and S2 normal. Exam reveals no gallop.  No murmur heard. Pulses:      Dorsalis pedis pulses are 2+ on the right side, and 2+ on the left side.  Respiratory: No respiratory distress. She has decreased breath sounds in the right lower field and the left lower field. She has no wheezes. She has rhonchi in the right lower field and the left lower field. She has no rales.  GI: Soft. Bowel sounds are normal. There is no tenderness.  Musculoskeletal:       Right ankle:  She exhibits no swelling.       Left ankle: She exhibits no swelling.  Lymphadenopathy:    She has no cervical adenopathy.  Neurological: She is alert.  Skin: Skin is warm. Nails show no clubbing.  Psychiatric: She has a normal mood and affect.      Data Reviewed: Basic Metabolic Panel: Recent Labs  Lab 05/17/17 1015 05/18/17 0512 05/20/17 0910 05/21/17 0833  NA 130* 132* 137 139  K 4.9 5.2* 3.7 3.9  CL 94* 96* 102 101  CO2 28 26 29 29   GLUCOSE 163* 95 158* 103*  BUN 55* 67* 12 16  CREATININE 4.87* 5.37* 2.31* 3.47*  CALCIUM 8.1* 7.7* 7.8* 8.2*  PHOS  --   --  2.7 3.0   Liver Function Tests: Recent Labs  Lab 05/17/17 1015 05/20/17 0910 05/21/17 0833  AST 25  --   --   ALT 9*  --   --   ALKPHOS 143*  --   --   BILITOT 0.7  --   --   PROT 6.0*  --   --   ALBUMIN 2.4* 2.3* 2.4*    CBC: Recent Labs  Lab 05/17/17 1015  05/18/17 0512  05/19/17 0015 05/19/17 0646 05/19/17 2041 05/20/17 0910 05/21/17 0437 05/21/17 0836  WBC 12.6*  --  11.6*  --  11.1*  --   --  10.0  --  7.7  HGB 9.0*   < >  7.8*   < > 7.3* 6.4* 9.2* 8.3* 8.1* 8.3*  HCT 27.3*  --  23.1*  --  21.2* 18.2* 25.4* 24.0*  --  24.0*  MCV 101.7*  --  92.9  --  92.9  --   --  90.5  --  92.9  PLT 271  --  198  --  224  --   --  221  --  249   < > = values in this interval not displayed.   Cardiac Enzymes: Recent Labs  Lab 05/17/17 1015  TROPONINI 0.04*    CBG: Recent Labs  Lab 05/20/17 1133 05/20/17 1634 05/20/17 2104 05/21/17 0752 05/21/17 1151  GLUCAP 150* 124* 162* 95 172*    Recent Results (from the past 240 hour(s))  Blood culture (routine x 2)     Status: None (Preliminary result)   Collection Time: 05/17/17 11:16 AM  Result Value Ref Range Status   Specimen Description BLOOD BLOOD RIGHT HAND  Final   Special Requests   Final    BOTTLES DRAWN AEROBIC AND ANAEROBIC Blood Culture adequate volume   Culture   Final    NO GROWTH 4 DAYS Performed at [facility], [address] [place], [place] [address]    Report Status PENDING  Incomplete  Blood culture (routine x 2)     Status: None (Preliminary result)   Collection Time: 05/17/17 11:16 AM  Result Value Ref Range Status   Specimen Description BLOOD FOREARM  Final   Special Requests   Final    BOTTLES DRAWN AEROBIC AND ANAEROBIC Blood Culture adequate volume   Culture   Final    NO GROWTH 4 DAYS Performed at [facility], [address], [place], [place] [address]    Report Status PENDING  Incomplete  C difficile quick scan w PCR reflex     Status: Abnormal   Collection Time: 05/17/17  6:25 PM  Result Value Ref Range Status   C Diff antigen POSITIVE (A) NEGATIVE Final   C Diff toxin POSITIVE (A) NEGATIVE Final   C Diff interpretation Toxin producing C. difficile detected.  Final    Comment: RESULT CALLED TO, READ BACK BY AND VERIFIED WITH: KIRSTEN JOYNER @1954  05/17/17 AKT Performed at [facility], [address] [place], [place] [address]      Studies: Nm Gi Blood Loss  Result Date: 05/19/2017 CLINICAL DATA:  GI bleed. EXAM: NUCLEAR MEDICINE GASTROINTESTINAL BLEEDING SCAN TECHNIQUE: Sequential abdominal images were obtained following intravenous administration of Tc-26m labeled red blood cells. RADIOPHARMACEUTICALS:  25.6 mCi Tc-46m pertechnetate in-vitro labeled red cells. COMPARISON:  CT of the abdomen and pelvis on 05/05/2017 FINDINGS: Imaging was performed over 2 hours. There is significant retained activity in the heart likely reflecting poor cardiac output. There is some visualization of free pertechnetate in the stomach. No active bleeding was identified from the gastrointestinal tract over 2 hours of imaging. IMPRESSION: No active bleeding identified from the gastrointestinal tract. Significant retained cardiac activity likely reflects poor cardiac output. Electronically Signed   By: Aletta Edouard M.D.   On: 05/19/2017 21:05   Dg Chest Port 1 View  Result Date:  05/19/2017 CLINICAL DATA:  Cough EXAM: PORTABLE CHEST 1 VIEW COMPARISON:  05/18/2017, 05/17/2017, 01/03/2017 FINDINGS: Post sternotomy changes. Cardiomegaly with vascular congestion. Persistent small right greater than left pleural effusions. Partial but incomplete clearing of right mid lung infiltrates. Aortic atherosclerosis. No pneumothorax. Vascular stent in the left subclavian region IMPRESSION: 1. Partial but incomplete  clearing of right pulmonary infiltrates or edema. 2. Persistent small right greater than left pleural effusion. 3. Cardiomegaly with vascular congestion and mild edema Electronically Signed   By: Donavan Foil M.D.   On: 05/19/2017 23:55    Scheduled Meds: . [MAR Hold] calcium carbonate  1 tablet Oral TID WC  . [MAR Hold] epoetin (EPOGEN/PROCRIT) injection  10,000 Units Intravenous Q M,W,F-HD  . [MAR Hold] feeding supplement (ENSURE ENLIVE)  237 mL Oral TID BM  . [MAR Hold] folic acid  0.5 mg Oral Daily  . [MAR Hold] Ganciclovir  1 drop Right Eye QID  . [MAR Hold] insulin aspart  0-9 Units Subcutaneous TID WC  . [MAR Hold] levothyroxine  75 mcg Oral BH-q7a  . [MAR Hold] metoprolol tartrate  25 mg Oral BID  . [MAR Hold] multivitamin  1 tablet Oral QHS  . [MAR Hold] pantoprazole (PROTONIX) IV  40 mg Intravenous Q12H  . [MAR Hold] polyethylene glycol-electrolytes  4,000 mL Oral Once  . [MAR Hold] pravastatin  40 mg Oral QPM  . [MAR Hold] vancomycin  500 mg Oral Q6H  . [MAR Hold] ascorbic acid  250 mg Oral BID   Continuous Infusions: . sodium chloride    . [MAR Hold] metronidazole Stopped (05/21/17 1248)    Assessment/Plan:  1. Acute blood loss anemia with burgundy looking stool.  GI  to do endoscopy and colonoscopy today.  Continue medications for C. difficile.  Transfused 2 units of packed red blood cells yesterday with good response.  Continue to hold aspirin and Plavix.  Patient on Protonix 40 mg IV twice daily. 2. C. difficile colitis.   patient on oral vancomycin  and IV Flagyl. 3. Transient atrial fibrillation converted to normal sinus rhythm.  Restart metoprolol since blood pressure is better. 4. Hyperlipidemia on pravastatin 5. End-stage renal disease.  Patient to have dialysis this evening. 6. Hypothyroidism unspecified on levothyroxine 7. History of CAD.  Unable to give blood thinner at this time. 8. Weakness.  Physical therapy recommended to go back to rehab  Code Status:     Code Status Orders  (From admission, onward)        Start     Ordered   05/17/17 1347  Do not attempt resuscitation (DNR)  Continuous    Question Answer Comment  In the event of cardiac or respiratory ARREST Do not call a "code blue"   In the event of cardiac or respiratory ARREST Do not perform Intubation, CPR, defibrillation or ACLS   In the event of cardiac or respiratory ARREST Use medication by any route, position, wound care, and other measures to relive pain and suffering. May use oxygen, suction and manual treatment of airway obstruction as needed for comfort.      05/17/17 1346    Code Status History    Date Active Date Inactive Code Status Order ID Comments User Context   05/05/2017 1547 05/07/2017 2121 DNR [id number]  Hillary Bow, MD ED   04/26/2017 1709 04/28/2017 2046 DNR [id number]  Demetrios Loll, MD Inpatient   01/03/2017 1328 01/08/2017 2014 DNR [id number]  Idelle Crouch, MD Inpatient   01/03/2017 1031 01/03/2017 1328 DNR [id number]  Merlyn Lot, MD ED   09/18/2015 1817 09/22/2015 1920 DNR [id number]  Fritzi Mandes, MD Inpatient   09/18/2015 1516 09/18/2015 1817 DNR [id number]  Flora Lipps, MD ED   03/22/2013 2100 03/26/2013 1540 Full Code [id number]  Rise Patience, MD Inpatient   11/20/2012 1756 12/06/2012 2008 DNR [id number]  Gevena Barre  K, MD Inpatient   06/18/2011 1042 06/19/2011 1401 Full Code [id number]  Paulene Floor, RN Inpatient   12/14/2010 1124 12/29/2010 1543 DNR [id number]  Bonnielee Haff, MD Inpatient   12/13/2010 0113 12/14/2010  1123 Full Code [id number]  Sid Falcon, RN Inpatient   11/22/2010 0216 11/28/2010 2239 DNR [id number]  Viviano Simas, RN Inpatient    Advance Directive Documentation     Most Recent Value  Type of Advance Directive  Healthcare Power of [place], Out of facility DNR (pink MOST or yellow form), Living will  Pre-existing out of facility DNR order (yellow form or pink MOST form)  Pink MOST form placed in chart (order not valid for inpatient use)  "MOST" Form in Place?  -     Disposition Plan: Patient came from a rehab facility and will likely be going back unless the patient gets stronger while here.  Continue physical therapy on a daily basis to try to get this patient stronger.    Consultants:  Gastroenterology  Nephrology  Antibiotics:  oral vancomycin  IV Flagyl  Time spent:  25 minutes. Case discussed with nephrology.  Laurieann Friddle [employer]

## 2017-05-21 NOTE — Anesthesia Preprocedure Evaluation (Signed)
Anesthesia Evaluation  Patient identified by MRN, date of birth, ID band Patient awake    Reviewed: Allergy & Precautions, H&P , NPO status , Patient's Chart, lab work & pertinent test results, reviewed documented beta blocker date and time   History of Anesthesia Complications Negative for: history of anesthetic complications  Airway   TM Distance: >3 FB Neck ROM: limited   Comment: Patient with AMS who is not cooperative Dental  (+) Edentulous Upper, Edentulous Lower, Dental Advidsory Given   Pulmonary neg pulmonary ROS, former smoker,           Cardiovascular Exercise Tolerance: Good hypertension, (-) angina+ CAD, + Cardiac Stents, + CABG, + Peripheral Vascular Disease and +CHF  (-) Past MI negative cardio ROS  (-) dysrhythmias + Valvular Problems/Murmurs (s/p AVR) AS      Neuro/Psych neg Seizures PSYCHIATRIC DISORDERS Anxiety CVA    GI/Hepatic Neg liver ROS, GERD  ,  Endo/Other  diabetesHypothyroidism   Renal/GU ESRF and DialysisRenal disease  negative genitourinary   Musculoskeletal   Abdominal   Peds  Hematology negative hematology ROS (+)   Anesthesia Other Findings Past Medical History: No date: Anemia     Comment:  NOS No date: Anxiety No date: Aortic stenosis No date: Cancer [facility])     Comment:  Mole on top of head to be removed in July 2013 No date: Chronic diastolic CHF (congestive heart failure) ([place])     Comment:  a. 01/2017 Echo: EF 60-65%, no rwma, Gr2 DD, mild to mod               MR, mildly dil LA/RA, nl RV fxn, Sev TR, PASP 54mmHg. No date: Constipation No date: Coronary artery disease     Comment:  a. 2009 CABG x 3 w/ bioprosthetic AVR. No date: Diabetes mellitus     Comment:  type II;was on Glipizide but has been off x 58mon No date: Dialysis patient ([place]) No date: Diarrhea No date: ESRD (end stage renal disease) ([place]) No date: GERD (gastroesophageal reflux disease) No date: History of  blood transfusion No date: Hyperlipidemia     Comment:  takes Simvastatin nightly No date: Hypertension     Comment:  takes Metoprolol daily No date: Hypothyroidism     Comment:  takes Synthroid daily No date: Migraine     Comment:  hx of No date: [place] No date: Osteoarthritis No date: Osteopenia No date: Peripheral vascular disease (HCC) No date: Personal history of colonic polyps No date: Pulmonary hypertension (HCC) No date: Urinary incontinence No date: UTI (urinary tract infection)  Obtained consent from her son.  Reproductive/Obstetrics negative OB ROS                             Anesthesia Physical Anesthesia Plan  ASA: IV  Anesthesia Plan: General   Post-op Pain Management:    Induction: Intravenous  PONV Risk Score and Plan: 3 and Propofol infusion  Airway Management Planned: Natural Airway and Nasal Cannula  Additional Equipment:   Intra-op Plan:   Post-operative Plan:   Informed Consent: I have reviewed the patients History and Physical, chart, labs and discussed the procedure including the risks, benefits and alternatives for the proposed anesthesia with the patient or authorized representative who has indicated his/her understanding and acceptance.   Dental Advisory Given  Plan Discussed with: Anesthesiologist, CRNA and Surgeon  Anesthesia Plan Comments:         Anesthesia Quick Evaluation

## 2017-05-21 NOTE — Care Management Important Message (Signed)
Important Message  Patient Details  Name: KAMIA INSALACO MRN: [id number] Date of Birth: May 13, 1920   Medicare Important Message Given:  Yes    Beverly Sessions, RN 05/21/2017, 4:19 PM

## 2017-05-21 NOTE — Transfer of Care (Signed)
Immediate Anesthesia Transfer of Care Note  Patient: [name]  Procedure(s) Performed: ESOPHAGOGASTRODUODENOSCOPY (EGD) ([name] )  Patient Location: PACU  Anesthesia Type:General  Level of Consciousness: sedated  Airway & Oxygen Therapy: Patient Spontanous Breathing and Patient connected to nasal cannula oxygen  Post-op Assessment: Report given to RN and Post -op Vital signs reviewed and stable  Post vital signs: Reviewed and stable  Last Vitals:  Vitals Value Taken Time  BP 117/52 05/21/2017  3:30 PM  Temp 36.1 C 05/21/2017  3:30 PM  Pulse 84 05/21/2017  3:30 PM  Resp 18 05/21/2017  3:30 PM  SpO2 100 % 05/21/2017  3:30 PM    Last Pain:  Vitals:   05/21/17 1530  TempSrc: Tympanic  PainSc:          Complications: No apparent anesthesia complications

## 2017-05-21 NOTE — Anesthesia Postprocedure Evaluation (Signed)
Anesthesia Post Note  Patient: [name]  Procedure(s) Performed: ESOPHAGOGASTRODUODENOSCOPY (EGD) ([name] )  Patient location during evaluation: Endoscopy Anesthesia Type: General Level of consciousness: awake and alert Pain management: pain level controlled Vital Signs Assessment: post-procedure vital signs reviewed and stable Respiratory status: spontaneous breathing, nonlabored ventilation, respiratory function stable and patient connected to nasal cannula oxygen Cardiovascular status: blood pressure returned to baseline and stable Postop Assessment: no apparent nausea or vomiting Anesthetic complications: no     Last Vitals:  Vitals:   05/21/17 1540 05/21/17 1550  BP: 122/67 122/67  Pulse: 85 86  Resp: 18 18  Temp:    SpO2: 100% 100%    Last Pain:  Vitals:   05/21/17 1550  TempSrc: Tympanic  PainSc:                  Kobee Medlen S

## 2017-05-21 NOTE — Progress Notes (Signed)
[facility]  ROUNDING NOTE   Subjective:   Colonoscopy for today  Objective:  Vital signs in last 24 hours:  Temp:  [97 F (36.1 C)-98.7 F (37.1 C)] 97 F (36.1 C) (05/17 1530) Pulse Rate:  [84-89] 89 (05/17 1600) Resp:  [16-18] 18 (05/17 1600) BP: (117-172)/(44-67) 172/59 (05/17 1600) SpO2:  [100 %] 100 % (05/17 1600)  Weight change:  Filed Weights   05/18/17 1225 05/19/17 1845 05/19/17 2243  Weight: 45.1 kg (99 lb 6.8 oz) 45.9 kg (101 lb 3.1 oz) 48.7 kg (107 lb 5.8 oz)    Intake/Output: I/O last 3 completed shifts: In: 450 [IV Piggyback:450] Out: 768 [Other:767; Stool:1]   Intake/Output this shift:  Total I/O In: 200 [I.V.:100; IV Piggyback:100] Out: -   Physical Exam: General: NAD, laying in bed  Head: Normocephalic, atraumatic. Moist oral mucosal membranes  Eyes: Anicteric, PERRL  Neck: Supple, trachea midline  Lungs:  Clear to auscultation  Heart: Regular rate and rhythm  Abdomen:  Soft, nontender,   Extremities: no peripheral edema.  Neurologic: Alert to self and place  Skin: No lesions  Access: Left AVF    Basic Metabolic Panel: Recent Labs  Lab 05/17/17 1015 05/18/17 0512 05/20/17 0910 05/21/17 0833  NA 130* 132* 137 139  K 4.9 5.2* 3.7 3.9  CL 94* 96* 102 101  CO2 28 26 29 29   GLUCOSE 163* 95 158* 103*  BUN 55* 67* 12 16  CREATININE 4.87* 5.37* 2.31* 3.47*  CALCIUM 8.1* 7.7* 7.8* 8.2*  PHOS  --   --  2.7 3.0    Liver Function Tests: Recent Labs  Lab 05/17/17 1015 05/20/17 0910 05/21/17 0833  AST 25  --   --   ALT 9*  --   --   ALKPHOS 143*  --   --   BILITOT 0.7  --   --   PROT 6.0*  --   --   ALBUMIN 2.4* 2.3* 2.4*   No results for input(s): LIPASE, AMYLASE in the last 168 hours. No results for input(s): AMMONIA in the last 168 hours.  CBC: Recent Labs  Lab 05/17/17 1015  05/18/17 0512  05/19/17 0015 05/19/17 0646 05/19/17 2041 05/20/17 0910 05/21/17 0437 05/21/17 0836  WBC 12.6*  --  11.6*  --   11.1*  --   --  10.0  --  7.7  HGB 9.0*   < > 7.8*   < > 7.3* 6.4* 9.2* 8.3* 8.1* 8.3*  HCT 27.3*  --  23.1*  --  21.2* 18.2* 25.4* 24.0*  --  24.0*  MCV 101.7*  --  92.9  --  92.9  --   --  90.5  --  92.9  PLT 271  --  198  --  224  --   --  221  --  249   < > = values in this interval not displayed.    Cardiac Enzymes: Recent Labs  Lab 05/17/17 1015  TROPONINI 0.04*    BNP: Invalid input(s): POCBNP  CBG: Recent Labs  Lab 05/20/17 1634 05/20/17 2104 05/21/17 0752 05/21/17 1151 05/21/17 1653  GLUCAP 124* 162* 95 172* 58    Microbiology: Results for orders placed or performed during the hospital encounter of 05/17/17  Blood culture (routine x 2)     Status: None (Preliminary result)   Collection Time: 05/17/17 11:16 AM  Result Value Ref Range Status   Specimen Description BLOOD BLOOD RIGHT HAND  Final   Special Requests  Final    BOTTLES DRAWN AEROBIC AND ANAEROBIC Blood Culture adequate volume   Culture   Final    NO GROWTH 4 DAYS Performed at [facility], [address] [place], [place] [address]    Report Status PENDING  Incomplete  Blood culture (routine x 2)     Status: None (Preliminary result)   Collection Time: 05/17/17 11:16 AM  Result Value Ref Range Status   Specimen Description BLOOD FOREARM  Final   Special Requests   Final    BOTTLES DRAWN AEROBIC AND ANAEROBIC Blood Culture adequate volume   Culture   Final    NO GROWTH 4 DAYS Performed at [facility], [address], [place], [place] [address]    Report Status PENDING  Incomplete  C difficile quick scan w PCR reflex     Status: Abnormal   Collection Time: 05/17/17  6:25 PM  Result Value Ref Range Status   C Diff antigen POSITIVE (A) NEGATIVE Final   C Diff toxin POSITIVE (A) NEGATIVE Final   C Diff interpretation Toxin producing C. difficile detected.  Final    Comment: RESULT CALLED TO, READ BACK BY AND VERIFIED WITH: KIRSTEN JOYNER @1954  05/17/17 AKT Performed at  [facility], [address] [place], [place] [address]     Coagulation Studies: No results for input(s): LABPROT, INR in the last 72 hours.  Urinalysis: No results for input(s): COLORURINE, LABSPEC, PHURINE, GLUCOSEU, HGBUR, BILIRUBINUR, KETONESUR, PROTEINUR, UROBILINOGEN, NITRITE, LEUKOCYTESUR in the last 72 hours.  Invalid input(s): APPERANCEUR    Imaging: Nm Gi Blood Loss  Result Date: 05/19/2017 CLINICAL DATA:  GI bleed. EXAM: NUCLEAR MEDICINE GASTROINTESTINAL BLEEDING SCAN TECHNIQUE: Sequential abdominal images were obtained following intravenous administration of Tc-29m labeled red blood cells. RADIOPHARMACEUTICALS:  25.6 mCi Tc-71m pertechnetate in-vitro labeled red cells. COMPARISON:  CT of the abdomen and pelvis on 05/05/2017 FINDINGS: Imaging was performed over 2 hours. There is significant retained activity in the heart likely reflecting poor cardiac output. There is some visualization of free pertechnetate in the stomach. No active bleeding was identified from the gastrointestinal tract over 2 hours of imaging. IMPRESSION: No active bleeding identified from the gastrointestinal tract. Significant retained cardiac activity likely reflects poor cardiac output. Electronically Signed   By: Aletta Edouard M.D.   On: 05/19/2017 21:05   Dg Chest Port 1 View  Result Date: 05/19/2017 CLINICAL DATA:  Cough EXAM: PORTABLE CHEST 1 VIEW COMPARISON:  05/18/2017, 05/17/2017, 01/03/2017 FINDINGS: Post sternotomy changes. Cardiomegaly with vascular congestion. Persistent small right greater than left pleural effusions. Partial but incomplete clearing of right mid lung infiltrates. Aortic atherosclerosis. No pneumothorax. Vascular stent in the left subclavian region IMPRESSION: 1. Partial but incomplete clearing of right pulmonary infiltrates or edema. 2. Persistent small right greater than left pleural effusion. 3. Cardiomegaly with vascular congestion and mild edema Electronically Signed    By: Donavan Foil M.D.   On: 05/19/2017 23:55     Medications:   . metronidazole Stopped (05/21/17 1248)   . calcium carbonate  1 tablet Oral TID WC  . epoetin (EPOGEN/PROCRIT) injection  10,000 Units Intravenous Q M,W,F-HD  . feeding supplement (ENSURE ENLIVE)  237 mL Oral TID BM  . folic acid  0.5 mg Oral Daily  . Ganciclovir  1 drop Right Eye QID  . insulin aspart  0-9 Units Subcutaneous TID WC  . levothyroxine  75 mcg Oral BH-q7a  . metoprolol tartrate  25 mg Oral BID  . multivitamin  1  tablet Oral QHS  . pantoprazole (PROTONIX) IV  40 mg Intravenous Q12H  . polyethylene glycol-electrolytes  4,000 mL Oral Once  . pravastatin  40 mg Oral QPM  . vancomycin  500 mg Oral Q6H  . ascorbic acid  250 mg Oral BID   acetaminophen, albuterol, HYDROcodone-acetaminophen, ondansetron **OR** ondansetron (ZOFRAN) IV, polyethylene glycol, polyvinyl alcohol, senna-docusate  Assessment/ Plan:  [name] is a 84 y.o. white female with end stage renal disease on hemodialysis, coronary artery disease, history of bilateral knee replacement, aortic valve replacement 2009, diabetes, hypertension, hypothyroidism, peripheral vascular disease, pulmonary hypertension, anxiety  [facility]/MWF  1. End Stage Renal Disease: hold dialysis today due to endoscopic procedure - Dialysis for tomorrow.   2. Hypertension: well controlled.  -  amlodipine and metoprolol  3. Anemia of chronic kidney disease:  Status post PRBC transfusions on 5/13 and 5/15.  - mircera as outpatient - EPO with HD treatment  4. Secondary Hyperparathyroidism:  - tums with meals.   5. C. Diff colitis: - PO vanco and metronidazole.  - Appreciate GI input   LOS: 4 Younes Degeorge 5/17/20195:15 PM

## 2017-05-21 NOTE — Op Note (Signed)
[facility] Gastroenterology Patient Name: [name] Procedure Date: 05/21/2017 2:40 PM MRN: [id number] Account #: [id number] Date of Birth: 04/20/1920 Admit Type: Inpatient Age: 84 Room: [facility] ENDO ROOM 2 Gender: Female Note Status: Finalized Procedure:            Upper GI endoscopy Indications:          Recent gastrointestinal bleeding Providers:            Benay Pike. Alice Reichert MD, MD Referring MD:         [name] (Referring MD) Medicines:            Propofol per Anesthesia Complications:        No immediate complications. Procedure:            Pre-Anesthesia Assessment:                       - The risks and benefits of the procedure and the                        sedation options and risks were discussed with the                        patient. All questions were answered and informed                        consent was obtained.                       - Patient identification and proposed procedure were                        verified prior to the procedure by the nurse. The                        procedure was verified in the procedure room.                       - ASA Grade Assessment: III - A patient with severe                        systemic disease.                       - After reviewing the risks and benefits, the patient                        was deemed in satisfactory condition to undergo the                        procedure.                       After obtaining informed consent, the endoscope was                        passed under direct vision. Throughout the procedure,                        the patient's blood pressure, pulse, and oxygen  saturations were monitored continuously. The Endoscope                        was introduced through the mouth, and advanced to the                        third part of duodenum. The upper GI endoscopy was                        accomplished without difficulty. The patient  tolerated                        the procedure well. Findings:      The examined esophagus was normal.      A 2 cm hiatal hernia was present.      The exam was otherwise without abnormality.      The examined duodenum was normal. Impression:           - Normal esophagus.                       - 2 cm hiatal hernia.                       - The examination was otherwise normal.                       - Normal examined duodenum.                       - No specimens collected. Recommendation:       - Proceed with colonoscopy Procedure Code(s):    --- Professional ---                       [phone], Esophagogastroduodenoscopy, flexible, transoral;                        diagnostic, including collection of specimen(s) by                        brushing or washing, when performed (separate procedure) Diagnosis Code(s):    --- Professional ---                       K92.2, Gastrointestinal hemorrhage, unspecified                       K44.9, Diaphragmatic hernia without obstruction or                        gangrene CPT copyright 2017 American Medical Association. All rights reserved. The codes documented in this report are preliminary and upon coder review may  be revised to meet current compliance requirements. Efrain Sella MD, MD 05/21/2017 2:59:31 PM This report has been signed electronically. Number of Addenda: 0 Note Initiated On: 05/21/2017 2:40 PM      [facility]

## 2017-05-21 NOTE — Anesthesia Procedure Notes (Signed)
Date/Time: 05/21/2017 2:56 PM Performed by: Nelda Marseille, CRNA Pre-anesthesia Checklist: Patient identified, Emergency Drugs available, Suction available, Patient being monitored and Timeout performed Oxygen Delivery Method: Nasal cannula

## 2017-05-21 NOTE — Op Note (Addendum)
[facility] Gastroenterology Patient Name: [name] Procedure Date: 05/21/2017 2:40 PM MRN: [id number] Account #: [id number] Date of Birth: Apr 03, 1920 Admit Type: Inpatient Age: 84 Room: [facility] ENDO ROOM 2 Gender: Female Note Status: Finalized Procedure:            Colonoscopy Indications:          Hematochezia, Acute post hemorrhagic anemia Providers:            Benay Pike. Alice Reichert MD, MD Referring MD:         [name] (Referring MD) Medicines:            Propofol per Anesthesia Complications:        No immediate complications. Procedure:            Pre-Anesthesia Assessment:                       - The risks and benefits of the procedure and the                        sedation options and risks were discussed with the                        patient. All questions were answered and informed                        consent was obtained.                       - The patient is unable to give consent secondary to                        the patient's altered mental status. The alternatives,                        risks and benefits of the procedure were discussed at                        length with the patient's daughter. The patient's proxy                        verbalized understanding of the risks as well as the                        alternatives and wished to proceed with the procedure.                       After obtaining informed consent, the colonoscope was                        passed under direct vision. Throughout the procedure,                        the patient's blood pressure, pulse, and oxygen                        saturations were monitored continuously. The                        Colonoscope was introduced through the anus and  advanced to the the cecum, identified by appendiceal                        orifice and ileocecal valve. The colonoscopy was                        somewhat difficult due to restricted  mobility of the                        colon. Successful completion of the procedure was aided                        by withdrawing and reinserting the scope. The patient                        tolerated the procedure well. The quality of the bowel                        preparation was fair. Findings:      The perianal exam findings include internal hemorrhoids that prolapse       with straining, but spontaneously regress to the resting position (Grade       II).      Many small-mouthed diverticula were found in the sigmoid colon.      A single (solitary) 35 mm ulcer was found in the rectum. Friable.       Stigmata of recent bleeding were present. Biopsies were taken with a       cold forceps for histology.      Two sessile, non-bleeding polyps were found in the transverse colon and       hepatic flexure. The polyps were 4 to 5 mm in size. Polypectomy was not       attempted due to recent gastrointestinal bleeding issue.      Non-bleeding internal hemorrhoids were found during retroflexion. The       hemorrhoids were Grade I (internal hemorrhoids that do not prolapse).      The exam was otherwise without abnormality. Impression:           - Preparation of the colon was fair.                       - Internal hemorrhoids that prolapse with straining,                        but spontaneously regress to the resting position                        (Grade II) found on perianal exam.                       - Diverticulosis in the sigmoid colon.                       - A single (solitary) ulcer in the rectum. Biopsied.                       - Two 4 to 5 mm, non-bleeding polyps in the transverse                        colon and at the  hepatic flexure. Resection not                        attempted.                       - Non-bleeding internal hemorrhoids.                       - The examination was otherwise normal. Recommendation:       - Return patient to hospital ward for ongoing care.                        - Full liquid diet. Procedure Code(s):    --- Professional ---                       [phone], Colonoscopy, flexible; with biopsy, single or                        multiple Diagnosis Code(s):    --- Professional ---                       K64.1, Second degree hemorrhoids                       K62.6, Ulcer of anus and rectum                       D12.3, Benign neoplasm of transverse colon (hepatic                        flexure or splenic flexure)                       K92.1, Melena (includes Hematochezia)                       D62, Acute posthemorrhagic anemia                       K57.30, Diverticulosis of large intestine without                        perforation or abscess without bleeding CPT copyright 2017 American Medical Association. All rights reserved. The codes documented in this report are preliminary and upon coder review may  be revised to meet current compliance requirements. Efrain Sella MD, MD 05/21/2017 3:36:26 PM This report has been signed electronically. Number of Addenda: 0 Note Initiated On: 05/21/2017 2:40 PM Scope Withdrawal Time: 0 hours 8 minutes 14 seconds  Total Procedure Duration: 0 hours 20 minutes 50 seconds       [facility]

## 2017-05-21 NOTE — Anesthesia Post-op Follow-up Note (Signed)
Anesthesia QCDR form completed.        

## 2017-05-22 ENCOUNTER — Inpatient Hospital Stay: Payer: Medicare Other

## 2017-05-22 LAB — CULTURE, BLOOD (ROUTINE X 2)
Culture: NO GROWTH
Culture: NO GROWTH
Special Requests: ADEQUATE
Special Requests: ADEQUATE

## 2017-05-22 LAB — GLUCOSE, CAPILLARY
GLUCOSE-CAPILLARY: 138 mg/dL — AB (ref 65–99)
GLUCOSE-CAPILLARY: 108 mg/dL — AB (ref 65–99)
Glucose-Capillary: 98 mg/dL (ref 65–99)
Glucose-Capillary: 182 mg/dL — ABNORMAL HIGH (ref 65–99)

## 2017-05-22 LAB — HEMOGLOBIN: Hemoglobin: 8.6 g/dL — ABNORMAL LOW (ref 12.0–16.0)

## 2017-05-22 MED ORDER — EPOETIN ALFA 10000 UNIT/ML IJ SOLN
10000.0000 [IU] | Freq: Once | INTRAMUSCULAR | Status: AC
  Administered 2017-05-22: 10000 [IU] via INTRAVENOUS

## 2017-05-22 NOTE — Progress Notes (Signed)
Patient ID: [name], female   DOB: 11-05-1920, 84 y.o.   MRN: [id number]  [employer] PROGRESS NOTE  [name]:[id number] DOB: 1920-06-04 DOA: 05/17/2017 PCP: [name], MD  HPI/Subjective: Patient with no  abdominal pain.  As per nursing staff they did not see any further bleeding.  Patient had EGD and colonoscopy on 05/21/2017 For hemodialysis today  Objective: Vitals:   05/21/17 1600 05/21/17 2023  BP: (!) 172/59 (!) 169/50  Pulse: 89 92  Resp: 18 17  Temp:  98.9 F (37.2 C)  SpO2: 100% 100%    Filed Weights   05/18/17 1225 05/19/17 1845 05/19/17 2243  Weight: 45.1 kg (99 lb 6.8 oz) 45.9 kg (101 lb 3.1 oz) 48.7 kg (107 lb 5.8 oz)    ROS: Review of Systems  Constitutional: Negative for chills and fever.  HENT: Positive for hearing loss.   Respiratory: Negative for shortness of breath.   Cardiovascular: Negative for chest pain.  Gastrointestinal: Negative for abdominal pain, nausea and vomiting.  Genitourinary: Negative for dysuria.  Musculoskeletal: Negative for joint pain.  Neurological: Negative for dizziness.   Exam: Physical Exam  HENT:  Nose: No mucosal edema.  Mouth/Throat: No oropharyngeal exudate or posterior oropharyngeal edema.  Eyes: Pupils are equal, round, and reactive to light. Conjunctivae, EOM and lids are normal.  Neck: No JVD present. Carotid bruit is not present. No edema present. No thyroid mass and no thyromegaly present.  Cardiovascular: S1 normal and S2 normal. Exam reveals no gallop.  No murmur heard. Pulses:      Dorsalis pedis pulses are 2+ on the right side, and 2+ on the left side.  Respiratory: No respiratory distress. She has decreased breath sounds in the right lower field and the left lower field. She has no wheezes. She has rhonchi in the right lower field and the left lower field. She has no rales.  GI: Soft. Bowel sounds are normal. There is no tenderness.  Musculoskeletal:       Right ankle: She exhibits no  swelling.       Left ankle: She exhibits no swelling.  Lymphadenopathy:    She has no cervical adenopathy.  Neurological: She is alert.  Skin: Skin is warm. Nails show no clubbing.  Psychiatric: She has a normal mood and affect.      Data Reviewed: Basic Metabolic Panel: Recent Labs  Lab 05/17/17 1015 05/18/17 0512 05/20/17 0910 05/21/17 0833  NA 130* 132* 137 139  K 4.9 5.2* 3.7 3.9  CL 94* 96* 102 101  CO2 28 26 29 29   GLUCOSE 163* 95 158* 103*  BUN 55* 67* 12 16  CREATININE 4.87* 5.37* 2.31* 3.47*  CALCIUM 8.1* 7.7* 7.8* 8.2*  PHOS  --   --  2.7 3.0   Liver Function Tests: Recent Labs  Lab 05/17/17 1015 05/20/17 0910 05/21/17 0833  AST 25  --   --   ALT 9*  --   --   ALKPHOS 143*  --   --   BILITOT 0.7  --   --   PROT 6.0*  --   --   ALBUMIN 2.4* 2.3* 2.4*    CBC: Recent Labs  Lab 05/17/17 1015  05/18/17 0512  05/19/17 0015 05/19/17 0646 05/19/17 2041 05/20/17 0910 05/21/17 0437 05/21/17 0836 05/22/17 0522  WBC 12.6*  --  11.6*  --  11.1*  --   --  10.0  --  7.7  --   HGB 9.0*   < >  7.8*   < > 7.3* 6.4* 9.2* 8.3* 8.1* 8.3* 8.6*  HCT 27.3*  --  23.1*  --  21.2* 18.2* 25.4* 24.0*  --  24.0*  --   MCV 101.7*  --  92.9  --  92.9  --   --  90.5  --  92.9  --   PLT 271  --  198  --  224  --   --  221  --  249  --    < > = values in this interval not displayed.   Cardiac Enzymes: Recent Labs  Lab 05/17/17 1015  TROPONINI 0.04*    CBG: Recent Labs  Lab 05/21/17 1151 05/21/17 1653 05/21/17 2155 05/22/17 0722 05/22/17 1148  GLUCAP 172* 66 167* 138* 98    Recent Results (from the past 240 hour(s))  Blood culture (routine x 2)     Status: None   Collection Time: 05/17/17 11:16 AM  Result Value Ref Range Status   Specimen Description BLOOD BLOOD RIGHT HAND  Final   Special Requests   Final    BOTTLES DRAWN AEROBIC AND ANAEROBIC Blood Culture adequate volume   Culture   Final    NO GROWTH 5 DAYS Performed at [facility], [address], [place], [place] [address]    Report Status 05/22/2017 FINAL  Final  Blood culture (routine x 2)     Status: None   Collection Time: 05/17/17 11:16 AM  Result Value Ref Range Status   Specimen Description BLOOD FOREARM  Final   Special Requests   Final    BOTTLES DRAWN AEROBIC AND ANAEROBIC Blood Culture adequate volume   Culture   Final    NO GROWTH 5 DAYS Performed at [facility], [address], [place], [place] [address]    Report Status 05/22/2017 FINAL  Final  C difficile quick scan w PCR reflex     Status: Abnormal   Collection Time: 05/17/17  6:25 PM  Result Value Ref Range Status   C Diff antigen POSITIVE (A) NEGATIVE Final   C Diff toxin POSITIVE (A) NEGATIVE Final   C Diff interpretation Toxin producing C. difficile detected.  Final    Comment: RESULT CALLED TO, READ BACK BY AND VERIFIED WITH: KIRSTEN JOYNER @1954  05/17/17 AKT Performed at [facility], [address] [place], [place] [address]      Studies: No results found.  Scheduled Meds: . calcium carbonate  1 tablet Oral TID WC  . epoetin (EPOGEN/PROCRIT) injection  10,000 Units Intravenous Q M,W,F-HD  . feeding supplement (ENSURE ENLIVE)  237 mL Oral TID BM  . folic acid  0.5 mg Oral Daily  . Ganciclovir  1 drop Right Eye QID  . insulin aspart  0-9 Units Subcutaneous TID WC  . levothyroxine  75 mcg Oral BH-q7a  . metoprolol tartrate  25 mg Oral BID  . multivitamin  1 tablet Oral QHS  . pantoprazole (PROTONIX) IV  40 mg Intravenous Q12H  . polyethylene glycol-electrolytes  4,000 mL Oral Once  . pravastatin  40 mg Oral QPM  . vancomycin  500 mg Oral Q6H  . ascorbic acid  250 mg Oral BID   Continuous Infusions: . metronidazole Stopped (05/22/17 0947)    Assessment/Plan:  1. Acute blood loss anemia with burgundy looking stool.  GI  did endoscopy and colonoscopy 5/17.  EGD has revealed hiatal hernia and colonoscopy has revealed hemorrhoids, diverticulosis and rectal  ulcer which could be the etiology of the  bleed.  Hemoglobin is stable and patient is hemodynamically stable continue medications for C. difficile.  Flagyl and vancomycin for 14 more days and outpatient GI follow-up in 2 weeks, 05/20/2017 transfused 2 units of packed red blood cells with good response.  Continue to hold aspirin and Plavix as recommended by gastroenterology as she is at high risk for GI bleed if we resume aspirin and Plavix.  Patient on Protonix 40 mg IV twice daily  Will change to p.o. 2. C. difficile colitis.   patient on oral vancomycin and IV Flagyl continue for 2 more weeks and outpatient follow-up with gastroenterology in 2 weeks is recommended. 3. Transient atrial fibrillation converted to normal sinus rhythm.  Restart metoprolol since blood pressure is better. 4. Hyperlipidemia on pravastatin 5. End-stage renal disease.  Patient to have  hemodialysis today as dialysis was not done yesterday because of her EGD and colonoscopy. 6. Hypothyroidism unspecified on levothyroxine 7. History of CAD.  Unable to give blood thinner at this time. 8. Recent history of pelvic fracture with weakness.  Physical therapy recommended to go back to rehab at [facility] Disposition to rehab center at [facility] anticipating tomorrow  Code Status:     Code Status Orders  (From admission, onward)        Start     Ordered   05/17/17 1347  Do not attempt resuscitation (DNR)  Continuous    Question Answer Comment  In the event of cardiac or respiratory ARREST Do not call a "code blue"   In the event of cardiac or respiratory ARREST Do not perform Intubation, CPR, defibrillation or ACLS   In the event of cardiac or respiratory ARREST Use medication by any route, position, wound care, and other measures to relive pain and suffering. May use oxygen, suction and manual treatment of airway obstruction as needed for comfort.      05/17/17 1346    Code Status History    Date Active Date  Inactive Code Status Order ID Comments User Context   05/05/2017 1547 05/07/2017 2121 DNR [id number]  Hillary Bow, MD ED   04/26/2017 1709 04/28/2017 2046 DNR [id number]  Demetrios Loll, MD Inpatient   01/03/2017 1328 01/08/2017 2014 DNR [id number]  Idelle Crouch, MD Inpatient   01/03/2017 1031 01/03/2017 1328 DNR [id number]  Merlyn Lot, MD ED   09/18/2015 1817 09/22/2015 1920 DNR [id number]  Fritzi Mandes, MD Inpatient   09/18/2015 1516 09/18/2015 1817 DNR [id number]  Flora Lipps, MD ED   03/22/2013 2100 03/26/2013 1540 Full Code [id number]  Rise Patience, MD Inpatient   11/20/2012 1756 12/06/2012 2008 DNR [id number]  Annita Brod, MD Inpatient   06/18/2011 1042 06/19/2011 1401 Full Code [id number]  Paulene Floor, RN Inpatient   12/14/2010 1124 12/29/2010 1543 DNR [id number]  Bonnielee Haff, MD Inpatient   12/13/2010 0113 12/14/2010 1123 Full Code [id number]  Sid Falcon, RN Inpatient   11/22/2010 0216 11/28/2010 2239 DNR [id number]  Viviano Simas, RN Inpatient    Advance Directive Documentation     Most Recent Value  Type of Advance Directive  Healthcare Power of Attorney, Out of facility DNR (pink MOST or yellow form), Living will  Pre-existing out of facility DNR order (yellow form or pink MOST form)  Pink MOST form placed in chart (order not valid for inpatient use)  "MOST" Form in Place?  -     Disposition Plan: Patient came from a rehab facility and will likely be going back unless the patient  gets stronger while here.  Continue physical therapy on a daily basis to try to get this patient stronger.    Consultants:  Gastroenterology  Nephrology  Antibiotics:  oral vancomycin  IV Flagyl  Time spent:  25 minutes. Case discussed with nephrology.  Nicholes Mango  [employer]

## 2017-05-22 NOTE — Progress Notes (Signed)
[facility] Gastroenterology Inpatient Progress Note  Subjective: Patient seen for f/u LGI bleed from Rectal ulcer. Stable. No recurrent bleeding per RN.   Objective: Vital signs in last 24 hours: Temp:  [98.9 F (37.2 C)-99.1 F (37.3 C)] 99.1 F (37.3 C) (05/18 1510) Pulse Rate:  [92-104] 104 (05/18 1510) Resp:  [17-18] 18 (05/18 1510) BP: (160-169)/(48-50) 160/48 (05/18 1510) SpO2:  [93 %-100 %] 93 % (05/18 1510) Blood pressure (!) 160/48, pulse (!) 104, temperature 99.1 F (37.3 C), resp. rate 18, height 5\' 3"  (1.6 m), weight 48.7 kg (107 lb 5.8 oz), SpO2 93 %.    Intake/Output from previous day: 05/17 0701 - 05/18 0700 In: 250 [I.V.:100; IV Piggyback:150] Out: -   Intake/Output this shift: Total I/O In: 200 [IV Piggyback:200] Out: -    General appearance:  Arousable, HOH, NAD. Oriented. Resp:  CTA Cardio: RR nl S1, S2 GI:  Flat, benign, bs+ Extremities:  No edema. Age related muscle atrophy present.   Lab Results: Results for orders placed or performed during the hospital encounter of 05/17/17 (from the past 24 hour(s))  Glucose, capillary     Status: Abnormal   Collection Time: 05/21/17  9:55 PM  Result Value Ref Range   Glucose-Capillary 167 (H) 65 - 99 mg/dL   Comment 1 Notify RN   Hemoglobin     Status: Abnormal   Collection Time: 05/22/17  5:22 AM  Result Value Ref Range   Hemoglobin 8.6 (L) 12.0 - 16.0 g/dL  Glucose, capillary     Status: Abnormal   Collection Time: 05/22/17  7:22 AM  Result Value Ref Range   Glucose-Capillary 138 (H) 65 - 99 mg/dL   Comment 1 Notify RN   Glucose, capillary     Status: None   Collection Time: 05/22/17 11:48 AM  Result Value Ref Range   Glucose-Capillary 98 65 - 99 mg/dL  Glucose, capillary     Status: Abnormal   Collection Time: 05/22/17  4:30 PM  Result Value Ref Range   Glucose-Capillary 182 (H) 65 - 99 mg/dL     Recent Labs    05/19/17 2041 05/20/17 0910 05/21/17 0437 05/21/17 0836 05/22/17 0522   WBC  --  10.0  --  7.7  --   HGB 9.2* 8.3* 8.1* 8.3* 8.6*  HCT 25.4* 24.0*  --  24.0*  --   PLT  --  221  --  249  --    BMET Recent Labs    05/20/17 0910 05/21/17 0833  NA 137 139  K 3.7 3.9  CL 102 101  CO2 29 29  GLUCOSE 158* 103*  BUN 12 16  CREATININE 2.31* 3.47*  CALCIUM 7.8* 8.2*   LFT Recent Labs    05/21/17 0833  ALBUMIN 2.4*   PT/INR No results for input(s): LABPROT, INR in the last 72 hours. Hepatitis Panel No results for input(s): HEPBSAG, HCVAB, HEPAIGM, HEPBIGM in the last 72 hours. C-Diff No results for input(s): CDIFFTOX in the last 72 hours. No results for input(s): CDIFFPCR in the last 72 hours.   Studies/Results: No results found.  Scheduled Inpatient Medications:   . calcium carbonate  1 tablet Oral TID WC  . epoetin (EPOGEN/PROCRIT) injection  10,000 Units Intravenous Q M,W,F-HD  . feeding supplement (ENSURE ENLIVE)  237 mL Oral TID BM  . folic acid  0.5 mg Oral Daily  . Ganciclovir  1 drop Right Eye QID  . insulin aspart  0-9 Units Subcutaneous TID WC  .  levothyroxine  75 mcg Oral BH-q7a  . metoprolol tartrate  25 mg Oral BID  . multivitamin  1 tablet Oral QHS  . pantoprazole (PROTONIX) IV  40 mg Intravenous Q12H  . polyethylene glycol-electrolytes  4,000 mL Oral Once  . pravastatin  40 mg Oral QPM  . vancomycin  500 mg Oral Q6H  . ascorbic acid  250 mg Oral BID    Continuous Inpatient Infusions:   . metronidazole Stopped (05/22/17 1549)    PRN Inpatient Medications:  acetaminophen, albuterol, HYDROcodone-acetaminophen, ondansetron **OR** ondansetron (ZOFRAN) IV, polyethylene glycol, polyvinyl alcohol, senna-docusate  Miscellaneous: [name]  Assessment:  1. Lower GI bleed - likely secondary to Rectal ulcer. Stable 2. ESRD - s/p hemodialysis today. 3. Advanced age. 4. C. Dif associated diarrhea - On flagyl and vancomycin.  Plan:  1. Agree with antibiotic therapy including duration of 15 days. 2. Avoid anticoagulants if at all  clinically feasible. 3. Continue diet. Focus on high fiber diet. 4. GI will sign off. Call back in the interim as needed. Thank you.  Eretria Manternach K. Alice Reichert, M.D. 05/22/2017, 5:50 PM

## 2017-05-22 NOTE — Progress Notes (Signed)
[facility]  ROUNDING NOTE   Subjective:   Seen and examined before hemodialysis treatment.   Endoscopy yesterday  Objective:  Vital signs in last 24 hours:  Temp:  [97 F (36.1 C)-98.9 F (37.2 C)] 98.9 F (37.2 C) (05/17 2023) Pulse Rate:  [84-92] 92 (05/17 2023) Resp:  [16-18] 17 (05/17 2023) BP: (117-172)/(47-67) 169/50 (05/17 2023) SpO2:  [100 %] 100 % (05/17 2023)  Weight change:  Filed Weights   05/18/17 1225 05/19/17 1845 05/19/17 2243  Weight: 45.1 kg (99 lb 6.8 oz) 45.9 kg (101 lb 3.1 oz) 48.7 kg (107 lb 5.8 oz)    Intake/Output: I/O last 3 completed shifts: In: 300 [I.V.:100; IV Piggyback:200] Out: 1 [Stool:1]   Intake/Output this shift:  Total I/O In: 100 [IV Piggyback:100] Out: -   Physical Exam: General: NAD, laying in bed  Head: Normocephalic, atraumatic. Moist oral mucosal membranes  Eyes: Anicteric, PERRL  Neck: Supple, trachea midline  Lungs:  Clear to auscultation  Heart: Regular rate and rhythm  Abdomen:  Soft, nontender,   Extremities: no peripheral edema.  Neurologic: Alert to self and place  Skin: No lesions  Access: Left AVF    Basic Metabolic Panel: Recent Labs  Lab 05/17/17 1015 05/18/17 0512 05/20/17 0910 05/21/17 0833  NA 130* 132* 137 139  K 4.9 5.2* 3.7 3.9  CL 94* 96* 102 101  CO2 28 26 29 29   GLUCOSE 163* 95 158* 103*  BUN 55* 67* 12 16  CREATININE 4.87* 5.37* 2.31* 3.47*  CALCIUM 8.1* 7.7* 7.8* 8.2*  PHOS  --   --  2.7 3.0    Liver Function Tests: Recent Labs  Lab 05/17/17 1015 05/20/17 0910 05/21/17 0833  AST 25  --   --   ALT 9*  --   --   ALKPHOS 143*  --   --   BILITOT 0.7  --   --   PROT 6.0*  --   --   ALBUMIN 2.4* 2.3* 2.4*   No results for input(s): LIPASE, AMYLASE in the last 168 hours. No results for input(s): AMMONIA in the last 168 hours.  CBC: Recent Labs  Lab 05/17/17 1015  05/18/17 0512  05/19/17 0015 05/19/17 0646 05/19/17 2041 05/20/17 0910 05/21/17 0437  05/21/17 0836 05/22/17 0522  WBC 12.6*  --  11.6*  --  11.1*  --   --  10.0  --  7.7  --   HGB 9.0*   < > 7.8*   < > 7.3* 6.4* 9.2* 8.3* 8.1* 8.3* 8.6*  HCT 27.3*  --  23.1*  --  21.2* 18.2* 25.4* 24.0*  --  24.0*  --   MCV 101.7*  --  92.9  --  92.9  --   --  90.5  --  92.9  --   PLT 271  --  198  --  224  --   --  221  --  249  --    < > = values in this interval not displayed.    Cardiac Enzymes: Recent Labs  Lab 05/17/17 1015  TROPONINI 0.04*    BNP: Invalid input(s): POCBNP  CBG: Recent Labs  Lab 05/21/17 1151 05/21/17 1653 05/21/17 2155 05/22/17 0722 05/22/17 1148  GLUCAP 172* 66 167* 138* 20    Microbiology: Results for orders placed or performed during the hospital encounter of 05/17/17  Blood culture (routine x 2)     Status: None   Collection Time: 05/17/17 11:16 AM  Result  Value Ref Range Status   Specimen Description BLOOD BLOOD RIGHT HAND  Final   Special Requests   Final    BOTTLES DRAWN AEROBIC AND ANAEROBIC Blood Culture adequate volume   Culture   Final    NO GROWTH 5 DAYS Performed at [facility], [address], [place], [place] [address]    Report Status 05/22/2017 FINAL  Final  Blood culture (routine x 2)     Status: None   Collection Time: 05/17/17 11:16 AM  Result Value Ref Range Status   Specimen Description BLOOD FOREARM  Final   Special Requests   Final    BOTTLES DRAWN AEROBIC AND ANAEROBIC Blood Culture adequate volume   Culture   Final    NO GROWTH 5 DAYS Performed at [facility], [address] [place], [place] [address]    Report Status 05/22/2017 FINAL  Final  C difficile quick scan w PCR reflex     Status: Abnormal   Collection Time: 05/17/17  6:25 PM  Result Value Ref Range Status   C Diff antigen POSITIVE (A) NEGATIVE Final   C Diff toxin POSITIVE (A) NEGATIVE Final   C Diff interpretation Toxin producing C. difficile detected.  Final    Comment: RESULT CALLED TO, READ BACK BY AND VERIFIED  WITH: KIRSTEN JOYNER @1952  05/17/17 AKT Performed at [facility], [address] [place], [place] [address]     Coagulation Studies: No results for input(s): LABPROT, INR in the last 72 hours.  Urinalysis: No results for input(s): COLORURINE, LABSPEC, PHURINE, GLUCOSEU, HGBUR, BILIRUBINUR, KETONESUR, PROTEINUR, UROBILINOGEN, NITRITE, LEUKOCYTESUR in the last 72 hours.  Invalid input(s): APPERANCEUR    Imaging: No results found.   Medications:   . metronidazole Stopped (05/22/17 0947)   . calcium carbonate  1 tablet Oral TID WC  . epoetin (EPOGEN/PROCRIT) injection  10,000 Units Intravenous Q M,W,F-HD  . epoetin (EPOGEN/PROCRIT) injection  10,000 Units Intravenous Once  . feeding supplement (ENSURE ENLIVE)  237 mL Oral TID BM  . folic acid  0.5 mg Oral Daily  . Ganciclovir  1 drop Right Eye QID  . insulin aspart  0-9 Units Subcutaneous TID WC  . levothyroxine  75 mcg Oral BH-q7a  . metoprolol tartrate  25 mg Oral BID  . multivitamin  1 tablet Oral QHS  . pantoprazole (PROTONIX) IV  40 mg Intravenous Q12H  . polyethylene glycol-electrolytes  4,000 mL Oral Once  . pravastatin  40 mg Oral QPM  . vancomycin  500 mg Oral Q6H  . ascorbic acid  250 mg Oral BID   acetaminophen, albuterol, HYDROcodone-acetaminophen, ondansetron **OR** ondansetron (ZOFRAN) IV, polyethylene glycol, polyvinyl alcohol, senna-docusate  Assessment/ Plan:  [name] is a 84 y.o. white female with end stage renal disease on hemodialysis, coronary artery disease, history of bilateral knee replacement, aortic valve replacement 2009, diabetes, hypertension, hypothyroidism, peripheral vascular disease, pulmonary hypertension, anxiety  [facility]/MWF  1. End Stage Renal Disease: hold dialysis today due to endoscopic procedure - Dialysis for today. Resume MWF schedule.   2. Hypertension: well controlled.  -  amlodipine and metoprolol  3. Anemia of chronic  kidney disease:  Status post PRBC transfusions on 5/13 and 5/15.  - mircera as outpatient - EPO with HD treatment - Appreciate GI input  4. Secondary Hyperparathyroidism:  - tums with meals.   5. C. Diff colitis: - PO vanco and metronidazole.  - Appreciate GI input   LOS: 5 Trey Gulbranson 5/18/201912:16  PM

## 2017-05-23 LAB — CBC
HEMATOCRIT: 25.2 % — AB (ref 35.0–47.0)
HEMOGLOBIN: 8.5 g/dL — AB (ref 12.0–16.0)
MCH: 32.7 pg (ref 26.0–34.0)
MCHC: 33.8 g/dL (ref 32.0–36.0)
MCV: 96.5 fL (ref 80.0–100.0)
Platelets: 238 10*3/uL (ref 150–440)
RBC: 2.61 MIL/uL — AB (ref 3.80–5.20)
RDW: 17.4 % — ABNORMAL HIGH (ref 11.5–14.5)
WBC: 8.4 10*3/uL (ref 3.6–11.0)

## 2017-05-23 LAB — GLUCOSE, CAPILLARY
GLUCOSE-CAPILLARY: 106 mg/dL — AB (ref 65–99)
GLUCOSE-CAPILLARY: 151 mg/dL — AB (ref 65–99)
GLUCOSE-CAPILLARY: 140 mg/dL — AB (ref 65–99)

## 2017-05-23 MED ORDER — METRONIDAZOLE 500 MG PO TABS
500.0000 mg | ORAL_TABLET | Freq: Three times a day (TID) | ORAL | Status: DC
  Administered 2017-05-23 – 2017-05-26 (×10): 500 mg via ORAL
  Filled 2017-05-23 (×12): qty 1

## 2017-05-23 MED ORDER — PANTOPRAZOLE SODIUM 40 MG PO TBEC
40.0000 mg | DELAYED_RELEASE_TABLET | Freq: Two times a day (BID) | ORAL | Status: DC
  Administered 2017-05-23 – 2017-05-26 (×6): 40 mg via ORAL
  Filled 2017-05-23 (×6): qty 1

## 2017-05-23 NOTE — Progress Notes (Signed)
[facility]  ROUNDING NOTE   Subjective:   Son at bedside. Patient continues to be confused.   Hemodialysis yesterday. Tolerated treatment well.   Objective:  Vital signs in last 24 hours:  Temp:  [98 F (36.7 C)-99.1 F (37.3 C)] 98.7 F (37.1 C) (05/18 2133) Pulse Rate:  [96-104] 96 (05/18 2133) Resp:  [5-24] 24 (05/18 2133) BP: (158-174)/(48-53) 158/53 (05/18 2133) SpO2:  [92 %-97 %] 92 % (05/18 2133)  Weight change:  Filed Weights   05/18/17 1225 05/19/17 1845 05/19/17 2243  Weight: 45.1 kg (99 lb 6.8 oz) 45.9 kg (101 lb 3.1 oz) 48.7 kg (107 lb 5.8 oz)    Intake/Output: I/O last 3 completed shifts: In: 350 [IV Piggyback:350] Out: -    Intake/Output this shift:  No intake/output data recorded.  Physical Exam: General: NAD, laying in bed  Head: Normocephalic, atraumatic. Moist oral mucosal membranes  Eyes: Anicteric, PERRL  Neck: Supple, trachea midline  Lungs:  Clear to auscultation  Heart: Regular rate and rhythm  Abdomen:  Soft, nontender,   Extremities: no peripheral edema.  Neurologic: Alert to self and place  Skin: No lesions  Access: Left AVF    Basic Metabolic Panel: Recent Labs  Lab 05/17/17 1015 05/18/17 0512 05/20/17 0910 05/21/17 0833  NA 130* 132* 137 139  K 4.9 5.2* 3.7 3.9  CL 94* 96* 102 101  CO2 28 26 29 29   GLUCOSE 163* 95 158* 103*  BUN 55* 67* 12 16  CREATININE 4.87* 5.37* 2.31* 3.47*  CALCIUM 8.1* 7.7* 7.8* 8.2*  PHOS  --   --  2.7 3.0    Liver Function Tests: Recent Labs  Lab 05/17/17 1015 05/20/17 0910 05/21/17 0833  AST 25  --   --   ALT 9*  --   --   ALKPHOS 143*  --   --   BILITOT 0.7  --   --   PROT 6.0*  --   --   ALBUMIN 2.4* 2.3* 2.4*   No results for input(s): LIPASE, AMYLASE in the last 168 hours. No results for input(s): AMMONIA in the last 168 hours.  CBC: Recent Labs  Lab 05/18/17 0512  05/19/17 0015 05/19/17 0646 05/19/17 2041 05/20/17 0910 05/21/17 0437 05/21/17 0836  05/22/17 0522 05/23/17 0655  WBC 11.6*  --  11.1*  --   --  10.0  --  7.7  --  8.4  HGB 7.8*   < > 7.3* 6.4* 9.2* 8.3* 8.1* 8.3* 8.6* 8.5*  HCT 23.1*  --  21.2* 18.2* 25.4* 24.0*  --  24.0*  --  25.2*  MCV 92.9  --  92.9  --   --  90.5  --  92.9  --  96.5  PLT 198  --  224  --   --  221  --  249  --  238   < > = values in this interval not displayed.    Cardiac Enzymes: Recent Labs  Lab 05/17/17 1015  TROPONINI 0.04*    BNP: Invalid input(s): POCBNP  CBG: Recent Labs  Lab 05/22/17 1148 05/22/17 1630 05/22/17 2131 05/23/17 0748 05/23/17 1154  GLUCAP 98 182* 108* 106* 151*    Microbiology: Results for orders placed or performed during the hospital encounter of 05/17/17  Blood culture (routine x 2)     Status: None   Collection Time: 05/17/17 11:16 AM  Result Value Ref Range Status   Specimen Description BLOOD BLOOD RIGHT HAND  Final  Special Requests   Final    BOTTLES DRAWN AEROBIC AND ANAEROBIC Blood Culture adequate volume   Culture   Final    NO GROWTH 5 DAYS Performed at [facility], [address] [place], [place] [address]    Report Status 05/22/2017 FINAL  Final  Blood culture (routine x 2)     Status: None   Collection Time: 05/17/17 11:16 AM  Result Value Ref Range Status   Specimen Description BLOOD FOREARM  Final   Special Requests   Final    BOTTLES DRAWN AEROBIC AND ANAEROBIC Blood Culture adequate volume   Culture   Final    NO GROWTH 5 DAYS Performed at [facility], [address], [place], [place] [address]    Report Status 05/22/2017 FINAL  Final  C difficile quick scan w PCR reflex     Status: Abnormal   Collection Time: 05/17/17  6:25 PM  Result Value Ref Range Status   C Diff antigen POSITIVE (A) NEGATIVE Final   C Diff toxin POSITIVE (A) NEGATIVE Final   C Diff interpretation Toxin producing C. difficile detected.  Final    Comment: RESULT CALLED TO, READ BACK BY AND VERIFIED WITH: KIRSTEN JOYNER @1952  05/17/17  AKT Performed at [facility], [address] [place], [place] [address]     Coagulation Studies: No results for input(s): LABPROT, INR in the last 72 hours.  Urinalysis: No results for input(s): COLORURINE, LABSPEC, PHURINE, GLUCOSEU, HGBUR, BILIRUBINUR, KETONESUR, PROTEINUR, UROBILINOGEN, NITRITE, LEUKOCYTESUR in the last 72 hours.  Invalid input(s): APPERANCEUR    Imaging: Dg Chest 2 View  Result Date: 05/22/2017 CLINICAL DATA:  Dialysis patient with C difficile. Cough. History EGD and colonoscopy on 05/21/2017. EXAM: CHEST - 2 VIEW COMPARISON:  05/19/2017 CXR FINDINGS: Stable cardiomegaly with moderate aortic atherosclerosis. Status post CABG and aortic valvular replacement. Interval increase in small to moderate right pleural effusion with stable small left effusion. Interval increase in interstitial edema. Vascular stent projects over the distal left subclavian. IMPRESSION: 1. Interval increase in small to moderate right-sided pleural effusion with vascular congestion. 2. Stable cardiomegaly with post CABG and aortic valvular replacement. 3. Aortic atherosclerosis. Electronically Signed   By: Ashley Royalty M.D.   On: 05/22/2017 18:26     Medications:   . metronidazole Stopped (05/23/17 9767)   . calcium carbonate  1 tablet Oral TID WC  . epoetin (EPOGEN/PROCRIT) injection  10,000 Units Intravenous Q M,W,F-HD  . feeding supplement (ENSURE ENLIVE)  237 mL Oral TID BM  . folic acid  0.5 mg Oral Daily  . Ganciclovir  1 drop Right Eye QID  . insulin aspart  0-9 Units Subcutaneous TID WC  . levothyroxine  75 mcg Oral BH-q7a  . metoprolol tartrate  25 mg Oral BID  . multivitamin  1 tablet Oral QHS  . pantoprazole (PROTONIX) IV  40 mg Intravenous Q12H  . polyethylene glycol-electrolytes  4,000 mL Oral Once  . pravastatin  40 mg Oral QPM  . vancomycin  500 mg Oral Q6H  . ascorbic acid  250 mg Oral BID   acetaminophen, albuterol, HYDROcodone-acetaminophen, ondansetron  **OR** ondansetron (ZOFRAN) IV, polyethylene glycol, polyvinyl alcohol, senna-docusate  Assessment/ Plan:  [name] is a 84 y.o. white female with end stage renal disease on hemodialysis, coronary artery disease, history of bilateral knee replacement, aortic valve replacement 2009, diabetes, hypertension, hypothyroidism, peripheral vascular disease, pulmonary hypertension, anxiety  [facility]/MWF  1. End Stage Renal Disease: hold  dialysis today due to endoscopic procedure - Dialysis for today. Resume MWF schedule.   2. Hypertension: well controlled.  -  amlodipine and metoprolol  3. Anemia of chronic kidney disease: with GI bleed from rectal ulcer Status post PRBC transfusions on 5/13 and 5/15.  - mircera as outpatient - EPO with HD treatments - Appreciate GI input  4. Secondary Hyperparathyroidism:  - tums with meals.   5. C. Diff colitis: - PO vanco and metronidazole.  - Appreciate GI input   LOS: 6 Angelino Rumery 5/19/201912:29 PM

## 2017-05-23 NOTE — Progress Notes (Signed)
PHARMACIST - PHYSICIAN COMMUNICATION DR:   Margaretmary Eddy CONCERNING: Antibiotic IV to Oral Route Change Policy  RECOMMENDATION: This patient is receiving metronidazole by the intravenous route.  Based on criteria approved by the Pharmacy and Therapeutics Committee, the antibiotic(s) is/are being converted to the equivalent oral dose form(s).   DESCRIPTION: These criteria include:  Patient being treated for a respiratory tract infection, urinary tract infection, cellulitis or clostridium difficile associated diarrhea if on metronidazole  The patient is not neutropenic and does not exhibit a GI malabsorption state  The patient is eating (either orally or via tube) and/or has been taking other orally administered medications for a least 24 hours  The patient is improving clinically and has a Tmax < 100.5  If you have questions about this conversion, please contact the Pharmacy Department  []   [phone] )  Otilia Kareem [x]   [phone] )  [facility] []   [phone] )  [facility] []   [phone] )  [facility] []   [phone] )  [facility]

## 2017-05-23 NOTE — Progress Notes (Signed)
Patient ID: [name], female   DOB: December 11, 1920, 84 y.o.   MRN: [id number]  [facility] PROGRESS NOTE  [name] KXF:[id number] DOB: [birth year] DOA: 05/17/2017 PCP: [name], MD  HPI/Subjective: Patient with no  abdominal pain.  No new complaints, resting comfortably as per nursing staff they did not see any further bleeding.  Patient had EGD and colonoscopy on 05/21/2017 For hemodialysis tomorrow  Objective: Vitals:   05/22/17 2118 05/22/17 2133  BP: (!) 174/52 (!) 158/53  Pulse: 97 96  Resp: (!) 5 (!) 24  Temp: 98 F (36.7 C) 98.7 F (37.1 C)  SpO2: 97% 92%    Filed Weights   05/18/17 1225 05/19/17 1845 05/19/17 2243  Weight: 45.1 kg (99 lb 6.8 oz) 45.9 kg (101 lb 3.1 oz) 48.7 kg (107 lb 5.8 oz)    ROS: Review of Systems  Constitutional: Negative for chills and fever.  HENT: Positive for hearing loss.   Respiratory: Negative for shortness of breath.   Cardiovascular: Negative for chest pain.  Gastrointestinal: Negative for abdominal pain, nausea and vomiting.  Genitourinary: Negative for dysuria.  Musculoskeletal: Negative for joint pain.  Neurological: Negative for dizziness.   Exam: Physical Exam  HENT:  Nose: No mucosal edema.  Mouth/Throat: No oropharyngeal exudate or posterior oropharyngeal edema.  Eyes: Pupils are equal, round, and reactive to light. Conjunctivae, EOM and lids are normal.  Neck: No JVD present. Carotid bruit is not present. No edema present. No thyroid mass and no thyromegaly present.  Cardiovascular: S1 normal and S2 normal. Exam reveals no gallop.  No murmur heard. Pulses:      Dorsalis pedis pulses are 2+ on the right side, and 2+ on the left side.  Respiratory: No respiratory distress. She has decreased breath sounds in the right lower field and the left lower field. She has no wheezes. She has rhonchi in the right lower field and the left lower field. She has no rales.  GI: Soft. Bowel sounds are normal. There is no  tenderness.  Musculoskeletal:       Right ankle: She exhibits no swelling.       Left ankle: She exhibits no swelling.  Lymphadenopathy:    She has no cervical adenopathy.  Neurological: She is alert.  Skin: Skin is warm. Nails show no clubbing.  Psychiatric: She has a normal mood and affect.      Data Reviewed: Basic Metabolic Panel: Recent Labs  Lab 05/17/17 1015 05/18/17 0512 05/20/17 0910 05/21/17 0833  NA 130* 132* 137 139  K 4.9 5.2* 3.7 3.9  CL 94* 96* 102 101  CO2 28 26 29 29   GLUCOSE 163* 95 158* 103*  BUN 55* 67* 12 16  CREATININE 4.87* 5.37* 2.31* 3.47*  CALCIUM 8.1* 7.7* 7.8* 8.2*  PHOS  --   --  2.7 3.0   Liver Function Tests: Recent Labs  Lab 05/17/17 1015 05/20/17 0910 05/21/17 0833  AST 25  --   --   ALT 9*  --   --   ALKPHOS 143*  --   --   BILITOT 0.7  --   --   PROT 6.0*  --   --   ALBUMIN 2.4* 2.3* 2.4*    CBC: Recent Labs  Lab 05/18/17 0512  05/19/17 0015 05/19/17 0646 05/19/17 2041 05/20/17 0910 05/21/17 0437 05/21/17 0836 05/22/17 0522 05/23/17 0655  WBC 11.6*  --  11.1*  --   --  10.0  --  7.7  --  8.4  HGB 7.8*   < > 7.3* 6.4* 9.2* 8.3* 8.1* 8.3* 8.6* 8.5*  HCT 23.1*  --  21.2* 18.2* 25.4* 24.0*  --  24.0*  --  25.2*  MCV 92.9  --  92.9  --   --  90.5  --  92.9  --  96.5  PLT 198  --  224  --   --  221  --  249  --  238   < > = values in this interval not displayed.   Cardiac Enzymes: Recent Labs  Lab 05/17/17 1015  TROPONINI 0.04*    CBG: Recent Labs  Lab 05/22/17 1148 05/22/17 1630 05/22/17 2131 05/23/17 0748 05/23/17 1154  GLUCAP 98 182* 108* 106* 151*    Recent Results (from the past 240 hour(s))  Blood culture (routine x 2)     Status: None   Collection Time: 05/17/17 11:16 AM  Result Value Ref Range Status   Specimen Description BLOOD BLOOD RIGHT HAND  Final   Special Requests   Final    BOTTLES DRAWN AEROBIC AND ANAEROBIC Blood Culture adequate volume   Culture   Final    NO GROWTH 5  DAYS Performed at [facility], [address] [place], [place] [address]    Report Status 05/22/2017 FINAL  Final  Blood culture (routine x 2)     Status: None   Collection Time: 05/17/17 11:16 AM  Result Value Ref Range Status   Specimen Description BLOOD FOREARM  Final   Special Requests   Final    BOTTLES DRAWN AEROBIC AND ANAEROBIC Blood Culture adequate volume   Culture   Final    NO GROWTH 5 DAYS Performed at [facility], [address], [place], [place] [address]    Report Status 05/22/2017 FINAL  Final  C difficile quick scan w PCR reflex     Status: Abnormal   Collection Time: 05/17/17  6:25 PM  Result Value Ref Range Status   C Diff antigen POSITIVE (A) NEGATIVE Final   C Diff toxin POSITIVE (A) NEGATIVE Final   C Diff interpretation Toxin producing C. difficile detected.  Final    Comment: RESULT CALLED TO, READ BACK BY AND VERIFIED WITH: KIRSTEN JOYNER @1954  05/17/17 AKT Performed at [facility], [address] [place], [place] [address]      Studies: Dg Chest 2 View  Result Date: 05/22/2017 CLINICAL DATA:  Dialysis patient with C difficile. Cough. History EGD and colonoscopy on 05/21/2017. EXAM: CHEST - 2 VIEW COMPARISON:  05/19/2017 CXR FINDINGS: Stable cardiomegaly with moderate aortic atherosclerosis. Status post CABG and aortic valvular replacement. Interval increase in small to moderate right pleural effusion with stable small left effusion. Interval increase in interstitial edema. Vascular stent projects over the distal left subclavian. IMPRESSION: 1. Interval increase in small to moderate right-sided pleural effusion with vascular congestion. 2. Stable cardiomegaly with post CABG and aortic valvular replacement. 3. Aortic atherosclerosis. Electronically Signed   By: Ashley Royalty M.D.   On: 05/22/2017 18:26    Scheduled Meds: . calcium carbonate  1 tablet Oral TID WC  . epoetin (EPOGEN/PROCRIT) injection  10,000 Units Intravenous  Q M,W,F-HD  . feeding supplement (ENSURE ENLIVE)  237 mL Oral TID BM  . folic acid  0.5 mg Oral Daily  . Ganciclovir  1 drop Right Eye QID  . insulin aspart  0-9 Units Subcutaneous TID WC  . levothyroxine  75 mcg Oral BH-q7a  . metoprolol tartrate  25  mg Oral BID  . metroNIDAZOLE  500 mg Oral Q8H  . multivitamin  1 tablet Oral QHS  . pantoprazole (PROTONIX) IV  40 mg Intravenous Q12H  . polyethylene glycol-electrolytes  4,000 mL Oral Once  . pravastatin  40 mg Oral QPM  . vancomycin  500 mg Oral Q6H  . ascorbic acid  250 mg Oral BID   Continuous Infusions:   Assessment/Plan:  1. Acute blood loss anemia with burgundy looking stool.  GI  did endoscopy and colonoscopy 5/17.  EGD has revealed hiatal hernia and colonoscopy has revealed hemorrhoids, diverticulosis and rectal ulcer which could be the etiology of the bleed.  Hemoglobin is stable and patient is hemodynamically stable continue medications for C. difficile.  Flagyl and vancomycin for 13 more days and outpatient GI follow-up in 2 weeks, 05/20/2017 transfused 2 units of packed red blood cells with good response.  Continue to hold aspirin and Plavix as recommended by gastroenterology as she is at high risk for GI bleed if we resume aspirin and Plavix.  Patient on Protonix 40 mg IV twice daily  Will change to p.o. continue stool softeners for better healing of the rectal ulcer .Hemoglobin at 8.5 2. C. difficile colitis.   patient on oral vancomycin and IV Flagyl continue for 13 more days and outpatient follow-up with gastroenterology in 2 weeks is recommended.  As per my discussion with Dr. Alice Reichert recommending to continue p.o. Flagyl and vancomycin for a total of 2 weeks starting from yesterday 05/22/2017 3. Transient atrial fibrillation converted to normal sinus rhythm.  Restart metoprolol since blood pressure is better. 4. Hyperlipidemia on pravastatin 5. End-stage renal disease.  Continue hemodialysis on Monday Wednesday and  Friday. 6. Hypothyroidism unspecified on levothyroxine 7. History of CAD.  Unable to give blood thinner at this time. 8. Recent history of pelvic fracture with weakness.  Physical therapy recommended to [facility] Dis[facility] to [facility] anticipating tomorrow  Code Status:     Code Status Orders  (From admission, onward)        Start     Ordered   05/17/17 1347  Do not attempt resuscitation (DNR)  Continuous    Question Answer Comment  In the event of cardiac or respiratory ARREST Do not call a "code blue"   In the event of cardiac or respiratory ARREST Do not perform Intubation, CPR, defibrillation or ACLS   In the event of cardiac or respiratory ARREST Use medication by any route, position, wound care, and other measures to relive pain and suffering. May use oxygen, suction and manual treatment of airway obstruction as needed for comfort.      05/17/17 1346    Code Status History    Date Active Date Inactive Code Status Order ID Comments User Context   05/05/2017 1547 05/07/2017 2121 DNR [id number]  Hillary Bow, MD ED   04/26/2017 1709 04/28/2017 2046 DNR [id number]  Demetrios Loll, MD Inpatient   01/03/2017 1328 01/08/2017 2014 DNR [id number]  Idelle Crouch, MD Inpatient   01/03/2017 1031 01/03/2017 1328 DNR [id number]  Merlyn Lot, MD ED   09/18/2015 1817 09/22/2015 1920 DNR [id number]  Fritzi Mandes, MD Inpatient   09/18/2015 1516 09/18/2015 1817 DNR [id number]  Flora Lipps, MD ED   03/22/2013 2100 03/26/2013 1540 Full Code [id number]  Rise Patience, MD Inpatient   11/20/2012 1756 12/06/2012 2008 DNR [id number]  Annita Brod, MD Inpatient   06/18/2011 1042 06/19/2011 1401 Full Code  [id number]  Paulene Floor, RN Inpatient   12/14/2010 1124 12/29/2010 1543 DNR [id number]  Bonnielee Haff, MD Inpatient   12/13/2010 0113 12/14/2010 1123 Full Code [id number]  Sid Falcon, RN Inpatient   11/22/2010 0216 11/28/2010 2239 DNR [id number]   Viviano Simas, RN Inpatient    Advance Directive Documentation     Most Recent Value  Type of Advance Directive  Healthcare Power of [place], Out of facility DNR (pink MOST or yellow form), Living will  Pre-existing out of facility DNR order (yellow form or pink MOST form)  Pink MOST form placed in chart (order not valid for inpatient use)  "MOST" Form in Place?  -     Disposition Plan: Patient came from a rehab facility and will likely be going back unless the patient gets stronger while here.  Continue physical therapy on a daily basis to try to get this patient stronger.    Consultants:  Gastroenterology  Nephrology  Antibiotics:  oral vancomycin  Oral Flagyl  Time spent:  25 minutes. Case discussed with nephrology.  [name]  [employer]

## 2017-05-24 ENCOUNTER — Encounter: Payer: Self-pay | Admitting: Internal Medicine

## 2017-05-24 DIAGNOSIS — K626 Ulcer of anus and rectum: Principal | ICD-10-CM

## 2017-05-24 LAB — CBC
HEMATOCRIT: 21.9 % — AB (ref 35.0–47.0)
Hemoglobin: 7.3 g/dL — ABNORMAL LOW (ref 12.0–16.0)
MCH: 32.1 pg (ref 26.0–34.0)
MCHC: 33.1 g/dL (ref 32.0–36.0)
MCV: 96.8 fL (ref 80.0–100.0)
Platelets: 245 10*3/uL (ref 150–440)
RBC: 2.26 MIL/uL — ABNORMAL LOW (ref 3.80–5.20)
RDW: 18.4 % — AB (ref 11.5–14.5)
WBC: 8.5 10*3/uL (ref 3.6–11.0)

## 2017-05-24 LAB — HEMOGLOBIN AND HEMATOCRIT, BLOOD
HCT: 20.1 % — ABNORMAL LOW (ref 35.0–47.0)
Hemoglobin: 6.8 g/dL — ABNORMAL LOW (ref 12.0–16.0)

## 2017-05-24 LAB — GLUCOSE, CAPILLARY
GLUCOSE-CAPILLARY: 129 mg/dL — AB (ref 65–99)
Glucose-Capillary: 161 mg/dL — ABNORMAL HIGH (ref 65–99)
Glucose-Capillary: 150 mg/dL — ABNORMAL HIGH (ref 65–99)
Glucose-Capillary: 195 mg/dL — ABNORMAL HIGH (ref 65–99)

## 2017-05-24 LAB — PREPARE RBC (CROSSMATCH)

## 2017-05-24 MED ORDER — METOPROLOL TARTRATE 50 MG PO TABS
50.0000 mg | ORAL_TABLET | Freq: Two times a day (BID) | ORAL | Status: DC
  Administered 2017-05-25 – 2017-05-26 (×4): 50 mg via ORAL
  Filled 2017-05-24 (×4): qty 1

## 2017-05-24 MED ORDER — AMLODIPINE BESYLATE 5 MG PO TABS
5.0000 mg | ORAL_TABLET | Freq: Every day | ORAL | Status: DC
  Administered 2017-05-25: 5 mg via ORAL
  Filled 2017-05-24: qty 1

## 2017-05-24 MED ORDER — SODIUM CHLORIDE 0.9 % IV SOLN: Freq: Once | INTRAVENOUS | Status: DC

## 2017-05-24 MED ORDER — VANCOMYCIN 50 MG/ML ORAL SOLUTION: 500.0000 mg | Freq: Four times a day (QID) | ORAL | 0 refills | Status: DC

## 2017-05-24 MED ORDER — POLYETHYLENE GLYCOL 3350 17 G PO PACK: 17.0000 g | PACK | Freq: Every day | ORAL | 0 refills | Status: AC | PRN

## 2017-05-24 MED ORDER — PANTOPRAZOLE SODIUM 40 MG PO TBEC: 40.0000 mg | DELAYED_RELEASE_TABLET | Freq: Two times a day (BID) | ORAL | Status: AC

## 2017-05-24 MED ORDER — INSULIN ASPART 100 UNIT/ML ~~LOC~~ SOLN: 0.0000 [IU] | Freq: Three times a day (TID) | SUBCUTANEOUS | 11 refills | Status: AC

## 2017-05-24 MED ORDER — METRONIDAZOLE 500 MG PO TABS: 500.0000 mg | ORAL_TABLET | Freq: Three times a day (TID) | ORAL | 0 refills | Status: DC

## 2017-05-24 MED ORDER — HYDROCODONE-ACETAMINOPHEN 5-325 MG PO TABS: 1.0000 | ORAL_TABLET | ORAL | 0 refills | Status: DC | PRN

## 2017-05-24 MED ORDER — EPOETIN ALFA 10000 UNIT/ML IJ SOLN: 10000.0000 [IU] | INTRAMUSCULAR | Status: AC

## 2017-05-24 NOTE — Discharge Instructions (Signed)
Follow-up with primary care physician at the facility in 3 to 5 days, please monitor patient's hemoglobin and hematocrit Follow-up with gastroenterology in 2 weeks Continue hemodialysis on Monday Wednesday and Friday

## 2017-05-24 NOTE — Progress Notes (Signed)
Patient ID: [name], female   DOB: 07/26/1920, 84 y.o.   MRN: [id number]  [facility] PROGRESS NOTE  CYRENA KUCHENBECKER CWC:[id number] DOB: 1920-07-24 DOA: 05/17/2017 PCP: [name], MD  HPI/Subjective: Patient with no  abdominal pain.  No new complaints, resting comfortably as per nursing staff patient has had small blood clots per rectum today early hours. patient had EGD and colonoscopy on 05/21/2017 For hemodialysis today we will transfuse during hemodialysis.  Hemoglobin dropped down to 7.3,  Objective: Vitals:   05/23/17 2145 05/24/17 0547  BP: (!) 175/56 (!) 159/50  Pulse: 100 93  Resp: 18 (!) 22  Temp: 98.4 F (36.9 C) 98.6 F (37 C)  SpO2: 90% 96%    Filed Weights   05/19/17 1845 05/19/17 2243 05/24/17 1500  Weight: 45.9 kg (101 lb 3.1 oz) 48.7 kg (107 lb 5.8 oz) 45.1 kg (99 lb 6.8 oz)    ROS: Review of Systems  Constitutional: Negative for chills and fever.  HENT: Positive for hearing loss.   Respiratory: Negative for shortness of breath.   Cardiovascular: Negative for chest pain.  Gastrointestinal: Negative for abdominal pain, nausea and vomiting.  Genitourinary: Negative for dysuria.  Musculoskeletal: Negative for joint pain.  Neurological: Negative for dizziness.   Exam: Physical Exam  HENT:  Nose: No mucosal edema.  Mouth/Throat: No oropharyngeal exudate or posterior oropharyngeal edema.  Eyes: Pupils are equal, round, and reactive to light. Conjunctivae, EOM and lids are normal.  Neck: No JVD present. Carotid bruit is not present. No edema present. No thyroid mass and no thyromegaly present.  Cardiovascular: S1 normal and S2 normal. Exam reveals no gallop.  No murmur heard. Pulses:      Dorsalis pedis pulses are 2+ on the right side, and 2+ on the left side.  Respiratory: No respiratory distress. She has decreased breath sounds in the right lower field and the left lower field. She has no wheezes. She has rhonchi in the right lower field and the  left lower field. She has no rales.  GI: Soft. Bowel sounds are normal. There is no tenderness.  Musculoskeletal:       Right ankle: She exhibits no swelling.       Left ankle: She exhibits no swelling.  Lymphadenopathy:    She has no cervical adenopathy.  Neurological: She is alert.  Skin: Skin is warm. Nails show no clubbing.  Psychiatric: She has a normal mood and affect.      Data Reviewed: Basic Metabolic Panel: Recent Labs  Lab 05/18/17 0512 05/20/17 0910 05/21/17 0833  NA 132* 137 139  K 5.2* 3.7 3.9  CL 96* 102 101  CO2 26 29 29   GLUCOSE 95 158* 103*  BUN 67* 12 16  CREATININE 5.37* 2.31* 3.47*  CALCIUM 7.7* 7.8* 8.2*  PHOS  --  2.7 3.0   Liver Function Tests: Recent Labs  Lab 05/20/17 0910 05/21/17 0833  ALBUMIN 2.3* 2.4*    CBC: Recent Labs  Lab 05/19/17 0015  05/19/17 2041 05/20/17 0910 05/21/17 0437 05/21/17 0836 05/22/17 0522 05/23/17 0655 05/24/17 0931  WBC 11.1*  --   --  10.0  --  7.7  --  8.4 8.5  HGB 7.3*   < > 9.2* 8.3* 8.1* 8.3* 8.6* 8.5* 7.3*  HCT 21.2*   < > 25.4* 24.0*  --  24.0*  --  25.2* 21.9*  MCV 92.9  --   --  90.5  --  92.9  --  96.5 96.8  PLT 224  --   --  221  --  249  --  238 245   < > = values in this interval not displayed.   Cardiac Enzymes: No results for input(s): CKTOTAL, CKMB, CKMBINDEX, TROPONINI in the last 168 hours.  CBG: Recent Labs  Lab 05/23/17 1154 05/23/17 2147 05/24/17 0805 05/24/17 0939 05/24/17 1150  GLUCAP 151* 140* 129* 161* 150*    Recent Results (from the past 240 hour(s))  Blood culture (routine x 2)     Status: None   Collection Time: 05/17/17 11:16 AM  Result Value Ref Range Status   Specimen Description BLOOD BLOOD RIGHT HAND  Final   Special Requests   Final    BOTTLES DRAWN AEROBIC AND ANAEROBIC Blood Culture adequate volume   Culture   Final    NO GROWTH 5 DAYS Performed at [facility], [address], [place], [place] [address]    Report Status 05/22/2017 FINAL   Final  Blood culture (routine x 2)     Status: None   Collection Time: 05/17/17 11:16 AM  Result Value Ref Range Status   Specimen Description BLOOD FOREARM  Final   Special Requests   Final    BOTTLES DRAWN AEROBIC AND ANAEROBIC Blood Culture adequate volume   Culture   Final    NO GROWTH 5 DAYS Performed at [facility], [address], [place], [place] [address]    Report Status 05/22/2017 FINAL  Final  C difficile quick scan w PCR reflex     Status: Abnormal   Collection Time: 05/17/17  6:25 PM  Result Value Ref Range Status   C Diff antigen POSITIVE (A) NEGATIVE Final   C Diff toxin POSITIVE (A) NEGATIVE Final   C Diff interpretation Toxin producing C. difficile detected.  Final    Comment: RESULT CALLED TO, READ BACK BY AND VERIFIED WITH: KIRSTEN JOYNER @1954  05/17/17 AKT Performed at [facility], [address] [place], [place] [address]      Studies: Dg Chest 2 View  Result Date: 05/22/2017 CLINICAL DATA:  Dialysis patient with C difficile. Cough. History EGD and colonoscopy on 05/21/2017. EXAM: CHEST - 2 VIEW COMPARISON:  05/19/2017 CXR FINDINGS: Stable cardiomegaly with moderate aortic atherosclerosis. Status post CABG and aortic valvular replacement. Interval increase in small to moderate right pleural effusion with stable small left effusion. Interval increase in interstitial edema. Vascular stent projects over the distal left subclavian. IMPRESSION: 1. Interval increase in small to moderate right-sided pleural effusion with vascular congestion. 2. Stable cardiomegaly with post CABG and aortic valvular replacement. 3. Aortic atherosclerosis. Electronically Signed   By: Ashley Royalty M.D.   On: 05/22/2017 18:26    Scheduled Meds: . calcium carbonate  1 tablet Oral TID WC  . epoetin (EPOGEN/PROCRIT) injection  10,000 Units Intravenous Q M,W,F-HD  . feeding supplement (ENSURE ENLIVE)  237 mL Oral TID BM  . folic acid  0.5 mg Oral Daily  . Ganciclovir  1  drop Right Eye QID  . insulin aspart  0-9 Units Subcutaneous TID WC  . levothyroxine  75 mcg Oral BH-q7a  . metoprolol tartrate  25 mg Oral BID  . metroNIDAZOLE  500 mg Oral Q8H  . multivitamin  1 tablet Oral QHS  . pantoprazole  40 mg Oral BID AC  . polyethylene glycol-electrolytes  4,000 mL Oral Once  . pravastatin  40 mg Oral QPM  . vancomycin  500 mg Oral Q6H  . ascorbic acid  250 mg Oral BID   Continuous Infusions: . sodium chloride      Assessment/Plan:  1. Acute blood loss anemia with burgundy looking stool.  GI  did endoscopy and colonoscopy 5/17.  EGD has revealed hiatal hernia and colonoscopy has revealed hemorrhoids, diverticulosis and rectal ulcer which could be the etiology of the bleed.  Biopsies are pending hemoglobin at 7.3.  Will transfuse 1 unit of blood during hemodialysis today discussed with the gastroenterology and nephrology. continue medications for C. difficile.  Flagyl and vancomycin for 12 more days and outpatient GI follow-up in 2 weeks, 05/20/2017 transfused 2 units of packed red blood cells with good response.  Continue to hold aspirin and Plavix as recommended by gastroenterology as she is at high risk for GI bleed if we resume aspirin and Plavix.  Patient on Protonix 40 mg IV twice daily  Will change to p.o. continue stool softeners for better healing of the rectal ulcer . 2. C. difficile colitis.   patient on oral vancomycin and IV Flagyl continue for 13 more days and outpatient follow-up with gastroenterology in 2 weeks is recommended.  As per my discussion with Dr. Alice Reichert recommending to continue p.o. Flagyl and vancomycin for a total of 2 weeks starting from yesterday 05/22/2017 3. Transient atrial fibrillation converted to normal sinus rhythm.  Restart metoprolol since blood pressure is better. 4. Hypertension resume metoprolol 50 mg and amlodipine and titrate as needed 5. End-stage renal disease.  Continue hemodialysis on Monday Wednesday and  Friday. 6. Hypothyroidism unspecified on levothyroxine 7. History of CAD.  Unable to give blood thinner at this time. 8. Recent history of pelvic fracture with weakness.  Physical therapy recommended [facility]ating tomorrow  Code Status:     Code Status Orders  (From admission, onward)        Start     Ordered   05/17/17 1347  Do not attempt resuscitation (DNR)  Continuous    Question Answer Comment  In the event of cardiac or respiratory ARREST Do not call a "code blue"   In the event of cardiac or respiratory ARREST Do not perform Intubation, CPR, defibrillation or ACLS   In the event of cardiac or respiratory ARREST Use medication by any route, position, wound care, and other measures to relive pain and suffering. May use oxygen, suction and manual treatment of airway obstruction as needed for comfort.      05/17/17 1346    Code Status History    Date Active Date Inactive Code Status Order ID Comments User Context   05/05/2017 1547 05/07/2017 2121 DNR [id number]  Hillary Bow, MD ED   04/26/2017 1709 04/28/2017 2046 DNR [id number]  Demetrios Loll, MD Inpatient   01/03/2017 1328 01/08/2017 2014 DNR [id number]  Idelle Crouch, MD Inpatient   01/03/2017 1031 01/03/2017 1328 DNR [id number]  Merlyn Lot, MD ED   09/18/2015 1817 09/22/2015 1920 DNR [id number]  Fritzi Mandes, MD Inpatient   09/18/2015 1516 09/18/2015 1817 DNR [id number]  Flora Lipps, MD ED   03/22/2013 2100 03/26/2013 1540 Full Code [id number]  Rise Patience, MD Inpatient   11/20/2012 1756 12/06/2012 2008 DNR [id number]  Annita Brod, MD Inpatient   06/18/2011 1042 06/19/2011 1401 Full Code [id number]  Paulene Floor, RN Inpatient   12/14/2010 1124 12/29/2010 1543 DNR [id number]  Bonnielee Haff, MD Inpatient   12/13/2010 0113 12/14/2010 1123 Full Code [id number]  Sid Falcon,  RN Inpatient   11/22/2010 0216 11/28/2010 2239 DNR [id number]   Viviano Simas, RN Inpatient    Advance Directive Documentation     Most Recent Value  Type of Advance Directive  Healthcare Power of [place], Out of facility DNR (pink MOST or yellow form), Living will  Pre-existing out of facility DNR order (yellow form or pink MOST form)  Pink MOST form placed in chart (order not valid for inpatient use)  "MOST" Form in Place?  -     Disposition Plan: Patient came from a rehab facility and will likely be going back unless the patient gets stronger while here.  Continue physical therapy on a daily basis to try to get this patient stronger.    Consultants:  Gastroenterology  Nephrology  Antibiotics:  oral vancomycin  Oral Flagyl  Time spent:  33 minutes. Case discussed with nephrology AND gi   [place]  [facility]

## 2017-05-24 NOTE — Clinical Social Work Note (Signed)
Patient's hemoglobin has dropped and patient will be transfused today. MD states that patient will more than likely discharge tomorrow. CSW has updated Broadus John at [employer]. Shela Leff MSW,LCSW [phone]

## 2017-05-24 NOTE — Progress Notes (Signed)
Blood transfusion start   05/24/17 2023  Vital Signs  Pulse Rate 94  Pulse Rate Source Monitor  Resp (!) 24  Oxygen Therapy  SpO2 98 %  O2 Device Nasal Cannula  O2 Flow Rate (L/min) 1 L/min

## 2017-05-24 NOTE — Progress Notes (Signed)
HD tx start    05/24/17 1959  Vital Signs  Pulse Rate 95  Pulse Rate Source Monitor  Resp (!) 21  BP (!) 145/48  BP Location Right Arm  BP Method Automatic  Patient Position (if appropriate) Lying  Oxygen Therapy  SpO2 96 %  O2 Device Nasal Cannula  O2 Flow Rate (L/min) 1 L/min  During Hemodialysis Assessment  Blood Flow Rate (mL/min) 300 mL/min  Arterial Pressure (mmHg) -200 mmHg  Venous Pressure (mmHg) 100 mmHg  Transmembrane Pressure (mmHg) 60 mmHg  Ultrafiltration Rate (mL/min) 170 mL/min  Dialysate Flow Rate (mL/min) 600 ml/min  Conductivity: Machine  14  HD Safety Checks Performed Yes  Dialysis Fluid Bolus Normal Saline  Bolus Amount (mL) 250 mL  Intra-Hemodialysis Comments Tx initiated  Fistula / Graft Left Upper arm Arteriovenous fistula  No Placement Date or Time found.   Orientation: Left  Access Location: Upper arm  Access Type: Arteriovenous fistula  Status Accessed  Needle Size 15

## 2017-05-24 NOTE — Care Management Important Message (Signed)
Important Message  Patient Details  Name: [name] MRN: [id number] Date of Birth: 11-11-1920   Medicare Important Message Given:  Yes  Signed IM notice given  Katrina Stack, RN 05/24/2017, 11:10 AM

## 2017-05-24 NOTE — Progress Notes (Signed)
Pre HD assessment    05/24/17 1935  Neurological  Level of Consciousness Alert  Orientation Level Oriented to person  Respiratory  Respiratory Pattern Tachypnea  Chest Assessment Chest expansion symmetrical  Vascular  R Radial Pulse +2  L Radial Pulse +2  Integumentary  Integumentary (WDL) X  Skin Color Appropriate for ethnicity  Musculoskeletal  Musculoskeletal (WDL) X  Generalized Weakness Yes  GU Assessment  Genitourinary (WDL) X  Genitourinary Symptoms  (HD)  Psychosocial  Psychosocial (WDL) WDL

## 2017-05-24 NOTE — Progress Notes (Signed)
Blood transfusion end    05/24/17 2121  Vital Signs  Temp 98.5 F (36.9 C)  Temp Source Oral  Pulse Rate 97  Pulse Rate Source Monitor  Resp 20  BP (!) 184/54  BP Location Right Arm  BP Method Automatic  Patient Position (if appropriate) Lying  Oxygen Therapy  SpO2 100 %  O2 Device Nasal Cannula  O2 Flow Rate (L/min) 1 L/min

## 2017-05-24 NOTE — Progress Notes (Signed)
Post HD assessment   05/24/17 2318  Neurological  Level of Consciousness Alert  Orientation Level Oriented to person  Respiratory  Respiratory Pattern Tachypnea  Chest Assessment Chest expansion symmetrical  Cardiac  Cardiac Rhythm ST  Vascular  R Radial Pulse +2  L Radial Pulse +2  Integumentary  Integumentary (WDL) X  Skin Color Appropriate for ethnicity  Musculoskeletal  Musculoskeletal (WDL) X  Generalized Weakness Yes  GU Assessment  Genitourinary (WDL) X  Genitourinary Symptoms  (HD)  Psychosocial  Psychosocial (WDL) WDL

## 2017-05-24 NOTE — Progress Notes (Signed)
[name] Antigua, MD [address] [place], [place], [place], [address] [address], [place], [place], [place], [address] Phone: [phone]  Fax: [phone]   Subjective:  Dr. Margaretmary Eddy, called me today as per patient having streaks of bright red blood in her stool.  No hematemesis.  Nurse report, patient has had output of small clots from her rectum.  No stool with these clots.  Objective: Exam: Vital signs in last 24 hours: Vitals:   05/22/17 2133 05/23/17 1300 05/23/17 2145 05/24/17 0547  BP: (!) 158/53 (!) 156/53 (!) 175/56 (!) 159/50  Pulse: 96 97 100 93  Resp: (!) 24 18 18  (!) 22  Temp: 98.7 F (37.1 C) 98 F (36.7 C) 98.4 F (36.9 C) 98.6 F (37 C)  TempSrc: Oral Oral Oral Oral  SpO2: 92% 94% 90% 96%  Weight:      Height:       Weight change:  No intake or output data in the 24 hours ending 05/24/17 1353  General: No acute distress, AAO x3 Abd: Soft, NT/ND, No HSM Skin: Warm, no rashes Neck: Supple, Trachea midline   Lab Results: Lab Results  Component Value Date   WBC 8.5 05/24/2017   HGB 7.3 (L) 05/24/2017   HCT 21.9 (L) 05/24/2017   MCV 96.8 05/24/2017   PLT 245 05/24/2017   Micro Results: Recent Results (from the past 240 hour(s))  Blood culture (routine x 2)     Status: None   Collection Time: 05/17/17 11:16 AM  Result Value Ref Range Status   Specimen Description BLOOD BLOOD RIGHT HAND  Final   Special Requests   Final    BOTTLES DRAWN AEROBIC AND ANAEROBIC Blood Culture adequate volume   Culture   Final    NO GROWTH 5 DAYS Performed at [facility], [address] [place], [place] [address]    Report Status 05/22/2017 FINAL  Final  Blood culture (routine x 2)     Status: None   Collection Time: 05/17/17 11:16 AM  Result Value Ref Range Status   Specimen Description BLOOD FOREARM  Final   Special Requests   Final    BOTTLES DRAWN AEROBIC AND ANAEROBIC Blood Culture adequate volume   Culture   Final    NO GROWTH 5  DAYS Performed at [facility], [address] [place], [place] [address]    Report Status 05/22/2017 FINAL  Final  C difficile quick scan w PCR reflex     Status: Abnormal   Collection Time: 05/17/17  6:25 PM  Result Value Ref Range Status   C Diff antigen POSITIVE (A) NEGATIVE Final   C Diff toxin POSITIVE (A) NEGATIVE Final   C Diff interpretation Toxin producing C. difficile detected.  Final    Comment: RESULT CALLED TO, READ BACK BY AND VERIFIED WITH: KIRSTEN JOYNER @1952  05/17/17 AKT Performed at [facility], [address] [place], [place] [address]    Studies/Results: Dg Chest 2 View  Result Date: 05/22/2017 CLINICAL DATA:  Dialysis patient with C difficile. Cough. History EGD and colonoscopy on 05/21/2017. EXAM: CHEST - 2 VIEW COMPARISON:  05/19/2017 CXR FINDINGS: Stable cardiomegaly with moderate aortic atherosclerosis. Status post CABG and aortic valvular replacement. Interval increase in small to moderate right pleural effusion with stable small left effusion. Interval increase in interstitial edema. Vascular stent projects over the distal left subclavian. IMPRESSION: 1. Interval increase in small to moderate right-sided pleural effusion with vascular congestion. 2. Stable cardiomegaly with post CABG  and aortic valvular replacement. 3. Aortic atherosclerosis. Electronically Signed   By: Ashley Royalty M.D.   On: 05/22/2017 18:26   Medications:  Scheduled Meds: . calcium carbonate  1 tablet Oral TID WC  . epoetin (EPOGEN/PROCRIT) injection  10,000 Units Intravenous Q M,W,F-HD  . feeding supplement (ENSURE ENLIVE)  237 mL Oral TID BM  . folic acid  0.5 mg Oral Daily  . Ganciclovir  1 drop Right Eye QID  . insulin aspart  0-9 Units Subcutaneous TID WC  . levothyroxine  75 mcg Oral BH-q7a  . metoprolol tartrate  25 mg Oral BID  . metroNIDAZOLE  500 mg Oral Q8H  . multivitamin  1 tablet Oral QHS  . pantoprazole  40 mg Oral BID AC  . polyethylene  glycol-electrolytes  4,000 mL Oral Once  . pravastatin  40 mg Oral QPM  . vancomycin  500 mg Oral Q6H  . ascorbic acid  250 mg Oral BID   Continuous Infusions: . sodium chloride     PRN Meds:.acetaminophen, albuterol, HYDROcodone-acetaminophen, ondansetron **OR** ondansetron (ZOFRAN) IV, polyethylene glycol, polyvinyl alcohol, senna-docusate   Assessment: Active Problems:   GIB (gastrointestinal bleeding)   Pressure injury of skin  84 year old female with history of ESRD, chronic antiplatelet therapy with aspirin and Plavix, admitted with rectal bleeding, with  recent antibiotics given for diverticulitis, and C. difficile positive on this admission, status post EGD and colonoscopy, which showed 35 mm rectal ulcer, and internal hemorrhoids as source of hematochezia.  Plan: Biopsies from rectal ulcer pending Rectal ulcers likely due to stercoral ulcer from constipation, which can happen in elderly patients Treatment will be to maintain soft stool Change MiraLAX from PRN to once daily   Nurse reports, that her rectal output consisted only of blood, with no stool.  Goal of changing MiraLAX dosing would be to see soft stool output.  Titrate to twice daily dosing in 1 to 2 days, if bowel movements are not at goal of 1-2 soft bowel movements daily  Continue serial CBCs and transfuse PRN Avoid NSAIDs No hemodynamic instability present at this time to indicate urgent repeat endoscopy at this time.  Risks of procedure would outweigh any benefits at this time.  If Patient has hemodynamic changes, low threshold for transfer to the ICU Please page GI with any hemodynamic changes, or active GI bleeding   LOS: 7 days   [name] Antigua, MD 05/24/2017, 1:53 PM

## 2017-05-24 NOTE — Progress Notes (Signed)
Pt bleeding from rectum, RN made aware, MD aware   05/24/17 2154  Hand-Off documentation  Report given to (Full Name) [facility]  Report received from (Full Name) Stark Bray

## 2017-05-24 NOTE — Progress Notes (Signed)
Pt tolerated tx well without c/o or complications. Net UF 5 ml, goal met. Pt experienced rectal bleeding during dialysis, RN aware, MD aware.    05/24/17 2319  Vital Signs  Temp 98.5 F (36.9 C)  Temp Source Oral  Pulse Rate (!) 102  Pulse Rate Source Monitor  Resp (!) 21  BP (!) 177/66  BP Location Right Arm  BP Method Automatic  Patient Position (if appropriate) Lying  Oxygen Therapy  SpO2 100 %  O2 Device Nasal Cannula  O2 Flow Rate (L/min) 1 L/min  Dialysis Weight  Weight 46.8 kg (103 lb 2.8 oz)  Type of Weight Post-Dialysis  Post-Hemodialysis Assessment  Rinseback Volume (mL) 250 mL  KECN 50.1 V  Dialyzer Clearance Lightly streaked  Duration of HD Treatment -hour(s) 3 hour(s)  Hemodialysis Intake (mL) 900 mL  UF Total -Machine (mL) 905 mL  Net UF (mL) 5 mL  Tolerated HD Treatment Yes  AVG/AVF Arterial Site Held (minutes) 10 minutes  AVG/AVF Venous Site Held (minutes) 10 minutes  Education / Care Plan  Dialysis Education Provided Yes  Documented Education in Care Plan Yes  Fistula / Graft Left Upper arm Arteriovenous fistula  No Placement Date or Time found.   Orientation: Left  Access Location: Upper arm  Access Type: Arteriovenous fistula  Site Condition No complications  Fistula / Graft Assessment Present;Thrill;Bruit  Status Deaccessed  Drainage Description None

## 2017-05-24 NOTE — Progress Notes (Signed)
Nutrition Follow Up Note   DOCUMENTATION CODES:   Severe malnutrition in context of chronic illness  INTERVENTION:   Ensure Enlive po TID, each supplement provides 350 kcal and 20 grams of protein  Magic cup TID with meals, each supplement provides 290 kcal and 9 grams of protein  Rena-vite daily  Vitamin C 250mg  po BID  Dysphagia 2/thin liquid diet   NUTRITION DIAGNOSIS:   Severe Malnutrition related to chronic illness(ESRD on HD, CHF) as evidenced by moderate fat depletion, severe muscle depletion.  GOAL:   Patient will meet greater than or equal to 90% of their needs  -progressing   MONITOR:   PO intake, Supplement acceptance, Labs, Weight trends, I & O's, Skin  ASSESSMENT:   84 y.o. female with a known history of diabetes mellitus, hypertension, CAD status post CABG, CVA, end-stage renal disease on hemodialysis admitted with GIB. Found to have C-Diff  L AVF. Resides at [facility].   Pt's appetite slightly improved. Spoke with RN, pt eating ate about 50% of her breakfast today which included oatmeal, applesauce, and eggs. Pt is drinking some Ensure; pt sips on this throughout the day. No new weight since 5/15; will request daily weights. Pt s/p CSE 5/15; approved for dysphagia 2/ thin liquid diet. Pt s/p EGD and colonoscopy 5/17 found to have hiatal hernia, hemorrhoids, diverticulosis and rectal ulcer. Pt also noted to have C-diff. Continue supplements and vitamins.   Medications reviewed and include: tums, epoetin, folic acid, insulin, synthroid, rena-vite, protonix, vancomycin, vit C, metronidazole  Labs reviewed: K 3.9 wnl, creat 3.47(H), Ca 8.2(L) adj. 9.48, P 3.0 wnl, alb 2.4(L) 5/17 Hgb 7.3(L), Hct 21.9(L)  Diet Order:   Diet Order           Diet renal with fluid restriction Fluid restriction: 1200 mL Fluid; Room service appropriate? Yes; Fluid consistency: Thin  Diet effective now         EDUCATION NEEDS:   Not appropriate for education at this  time  Skin:  Skin Assessment: Reviewed RN Assessment(ecchymosis )  Last BM:  5/20- type 6  Height:   Ht Readings from Last 1 Encounters:  05/17/17 5\' 3"  (1.6 m)    Weight:   Wt Readings from Last 1 Encounters:  05/19/17 107 lb 5.8 oz (48.7 kg)    Ideal Body Weight:  52.3 kg  BMI:  Body mass index is 19.02 kg/m.  Estimated Nutritional Needs:   Kcal:  1200-1400kcal/day   Protein:  72-82g/day   Fluid:  >1.2L/day   Koleen Distance MS, RD, LDN Pager #[phone] After Hours Pager: [phone]

## 2017-05-24 NOTE — Progress Notes (Signed)
[facility]  ROUNDING NOTE   Subjective:   Patient is doing fair Denies acute c/o Appetite is low No SOB   Objective:  Vital signs in last 24 hours:  Temp:  [98 F (36.7 C)-98.6 F (37 C)] 98.6 F (37 C) (05/20 0547) Pulse Rate:  [93-100] 93 (05/20 0547) Resp:  [18-22] 22 (05/20 0547) BP: (156-175)/(50-56) 159/50 (05/20 0547) SpO2:  [90 %-96 %] 96 % (05/20 0547)  Weight change:  Filed Weights   05/18/17 1225 05/19/17 1845 05/19/17 2243  Weight: 45.1 kg (99 lb 6.8 oz) 45.9 kg (101 lb 3.1 oz) 48.7 kg (107 lb 5.8 oz)    Intake/Output: I/O last 3 completed shifts: In: 100 [IV Piggyback:100] Out: -    Intake/Output this shift:  No intake/output data recorded.  Physical Exam: General: NAD, laying in bed  Head: Normocephalic, atraumatic. Moist oral mucosal membranes  Eyes: Anicteric,    Neck: Supple, trachea midline  Lungs:  Clear to auscultation  Heart:  no rub  Abdomen:  Soft, nontender,   Extremities: no peripheral edema.  Neurologic: Alert and oriented; decreased hearing  Skin: No lesions  Access: Left AVF    Basic Metabolic Panel: Recent Labs  Lab 05/18/17 0512 05/20/17 0910 05/21/17 0833  NA 132* 137 139  K 5.2* 3.7 3.9  CL 96* 102 101  CO2 26 29 29   GLUCOSE 95 158* 103*  BUN 67* 12 16  CREATININE 5.37* 2.31* 3.47*  CALCIUM 7.7* 7.8* 8.2*  PHOS  --  2.7 3.0    Liver Function Tests: Recent Labs  Lab 05/20/17 0910 05/21/17 0833  ALBUMIN 2.3* 2.4*   No results for input(s): LIPASE, AMYLASE in the last 168 hours. No results for input(s): AMMONIA in the last 168 hours.  CBC: Recent Labs  Lab 05/19/17 0015  05/19/17 2041 05/20/17 0910 05/21/17 0437 05/21/17 0836 05/22/17 0522 05/23/17 0655 05/24/17 0931  WBC 11.1*  --   --  10.0  --  7.7  --  8.4 8.5  HGB 7.3*   < > 9.2* 8.3* 8.1* 8.3* 8.6* 8.5* 7.3*  HCT 21.2*   < > 25.4* 24.0*  --  24.0*  --  25.2* 21.9*  MCV 92.9  --   --  90.5  --  92.9  --  96.5 96.8  PLT 224  --    --  221  --  249  --  238 245   < > = values in this interval not displayed.    Cardiac Enzymes: No results for input(s): CKTOTAL, CKMB, CKMBINDEX, TROPONINI in the last 168 hours.  BNP: Invalid input(s): POCBNP  CBG: Recent Labs  Lab 05/23/17 0748 05/23/17 1154 05/23/17 2147 05/24/17 0805 05/24/17 0939  GLUCAP 106* 151* 140* 129* 161*    Microbiology: Results for orders placed or performed during the hospital encounter of 05/17/17  Blood culture (routine x 2)     Status: None   Collection Time: 05/17/17 11:16 AM  Result Value Ref Range Status   Specimen Description BLOOD BLOOD RIGHT HAND  Final   Special Requests   Final    BOTTLES DRAWN AEROBIC AND ANAEROBIC Blood Culture adequate volume   Culture   Final    NO GROWTH 5 DAYS Performed at [facility], [address], [place], [place] [address]    Report Status 05/22/2017 FINAL  Final  Blood culture (routine x 2)     Status: None   Collection Time: 05/17/17 11:16 AM  Result Value Ref  Range Status   Specimen Description BLOOD FOREARM  Final   Special Requests   Final    BOTTLES DRAWN AEROBIC AND ANAEROBIC Blood Culture adequate volume   Culture   Final    NO GROWTH 5 DAYS Performed at [facility], [address] [place], [place] [address]    Report Status 05/22/2017 FINAL  Final  C difficile quick scan w PCR reflex     Status: Abnormal   Collection Time: 05/17/17  6:25 PM  Result Value Ref Range Status   C Diff antigen POSITIVE (A) NEGATIVE Final   C Diff toxin POSITIVE (A) NEGATIVE Final   C Diff interpretation Toxin producing C. difficile detected.  Final    Comment: RESULT CALLED TO, READ BACK BY AND VERIFIED WITH: KIRSTEN JOYNER @1952  05/17/17 AKT Performed at [facility], [address] [place], [place] [address]     Coagulation Studies: No results for input(s): LABPROT, INR in the last 72 hours.  Urinalysis: No results for input(s): COLORURINE, LABSPEC, PHURINE,  GLUCOSEU, HGBUR, BILIRUBINUR, KETONESUR, PROTEINUR, UROBILINOGEN, NITRITE, LEUKOCYTESUR in the last 72 hours.  Invalid input(s): APPERANCEUR    Imaging: Dg Chest 2 View  Result Date: 05/22/2017 CLINICAL DATA:  Dialysis patient with C difficile. Cough. History EGD and colonoscopy on 05/21/2017. EXAM: CHEST - 2 VIEW COMPARISON:  05/19/2017 CXR FINDINGS: Stable cardiomegaly with moderate aortic atherosclerosis. Status post CABG and aortic valvular replacement. Interval increase in small to moderate right pleural effusion with stable small left effusion. Interval increase in interstitial edema. Vascular stent projects over the distal left subclavian. IMPRESSION: 1. Interval increase in small to moderate right-sided pleural effusion with vascular congestion. 2. Stable cardiomegaly with post CABG and aortic valvular replacement. 3. Aortic atherosclerosis. Electronically Signed   By: Ashley Royalty M.D.   On: 05/22/2017 18:26     Medications:   . sodium chloride     . calcium carbonate  1 tablet Oral TID WC  . epoetin (EPOGEN/PROCRIT) injection  10,000 Units Intravenous Q M,W,F-HD  . feeding supplement (ENSURE ENLIVE)  237 mL Oral TID BM  . folic acid  0.5 mg Oral Daily  . Ganciclovir  1 drop Right Eye QID  . insulin aspart  0-9 Units Subcutaneous TID WC  . levothyroxine  75 mcg Oral BH-q7a  . metoprolol tartrate  25 mg Oral BID  . metroNIDAZOLE  500 mg Oral Q8H  . multivitamin  1 tablet Oral QHS  . pantoprazole  40 mg Oral BID AC  . polyethylene glycol-electrolytes  4,000 mL Oral Once  . pravastatin  40 mg Oral QPM  . vancomycin  500 mg Oral Q6H  . ascorbic acid  250 mg Oral BID   acetaminophen, albuterol, HYDROcodone-acetaminophen, ondansetron **OR** ondansetron (ZOFRAN) IV, polyethylene glycol, polyvinyl alcohol, senna-docusate  Assessment/ Plan:  [name] is a 84 y.o. white female with end stage renal disease on hemodialysis, coronary artery disease, history of bilateral knee  replacement, aortic valve replacement 2009, diabetes, hypertension, hypothyroidism, peripheral vascular disease, pulmonary hypertension, anxiety  [facility] [address]/MWF  1. End Stage Renal Disease:   -  MWF schedule.   2.  Anemia of chronic kidney disease: with GI bleed from rectal ulcer - Status post PRBC transfusions on 5/13 and 5/15.  - mircera as outpatient - EPO with HD treatments - Appreciate GI input  3. Secondary Hyperparathyroidism:  - tums with meals.   4. C. Diff colitis: - PO vanco and metronidazole.  LOS: 7 Kayzlee Wirtanen 5/20/201911:24 AM

## 2017-05-24 NOTE — Progress Notes (Signed)
Pre HD assessment    05/24/17 1930  Vital Signs  Temp 98.2 F (36.8 C)  Temp Source Oral  Pulse Rate 95  Pulse Rate Source Monitor  Resp 20  BP (!) 144/49  BP Location Right Arm  BP Method Automatic  Patient Position (if appropriate) Lying  Oxygen Therapy  SpO2 96 %  O2 Device Nasal Cannula  O2 Flow Rate (L/min) 1 L/min  Pain Assessment  Pain Scale 0-10  Pain Score 0  Dialysis Weight  Weight 46.9 kg (103 lb 6.3 oz)  Type of Weight Pre-Dialysis  Time-Out for Hemodialysis  What Procedure? HD  Pt Identifiers(min of two) First/Last Name;MRN/Account#  Correct Site? Yes  Correct Side? Yes  Correct Procedure? Yes  Consents Verified? Yes  Rad Studies Available? [name]  Safety Precautions Reviewed? Yes  Research scientist (physical sciences)  (1A)  Station Number  (bedside 218)  UF/Alarm Test Passed  Conductivity: Machine  13.8  pH 14  Reverse Osmosis 7.4  Normal Saline Lot Number [id number]  Dialyzer Lot Number [id number]  Disposable Set Lot Number [id number]  Machine Temperature 98.6 F (37 C)  Musician and Audible Yes  Blood Lines Intact and Secured Yes  Pre Treatment Patient Checks  Vascular access used during treatment Fistula  Hepatitis B Surface Antigen Results Negative  Date Hepatitis B Surface Antigen Drawn 01/03/17  Hepatitis B Surface Antibody  (>10)  Date Hepatitis B Surface Antibody Drawn 01/03/17  Hemodialysis Consent Verified Yes  Hemodialysis Standing Orders Initiated Yes  ECG (Telemetry) Monitor On Yes  Prime Ordered Normal Saline  Length of  DialysisTreatment -hour(s) 3 Hour(s)  Dialyzer Elisio 17H NR  Dialysis Anticoagulant None  Dialysate Flow Ordered 600  Blood Flow Rate Ordered 300 mL/min  Ultrafiltration Goal 0 Liters  Pre Treatment Labs Renal panel;CBC  Blood Products Ordered Packed Red Blood Cells  Dialysis Blood Pressure Support Ordered Normal Saline  Education / Care Plan  Dialysis Education Provided Yes  Documented Education in Care Plan  Yes  Fistula / Graft Left Upper arm Arteriovenous fistula  No Placement Date or Time found.   Orientation: Left  Access Location: Upper arm  Access Type: Arteriovenous fistula  Site Condition No complications  Fistula / Graft Assessment Present;Thrill;Bruit  Drainage Description None

## 2017-05-24 NOTE — Progress Notes (Signed)
HD tx end   05/24/17 2305  Vital Signs  Pulse Rate (!) 101  Pulse Rate Source Monitor  Resp (!) 22  BP (!) 143/45  BP Location Right Arm  BP Method Automatic  Patient Position (if appropriate) Lying  Oxygen Therapy  SpO2 100 %  O2 Device Nasal Cannula  O2 Flow Rate (L/min) 1 L/min  During Hemodialysis Assessment  Dialysis Fluid Bolus Normal Saline  Bolus Amount (mL) 250 mL  Intra-Hemodialysis Comments Tx completed

## 2017-05-25 DIAGNOSIS — K625 Hemorrhage of anus and rectum: Secondary | ICD-10-CM

## 2017-05-25 LAB — CBC
HCT: 31.4 % — ABNORMAL LOW (ref 35.0–47.0)
HCT: 28.9 % — ABNORMAL LOW (ref 35.0–47.0)
HEMOGLOBIN: 10.6 g/dL — AB (ref 12.0–16.0)
Hemoglobin: 9.7 g/dL — ABNORMAL LOW (ref 12.0–16.0)
MCH: 31.9 pg (ref 26.0–34.0)
MCH: 31.9 pg (ref 26.0–34.0)
MCHC: 33.5 g/dL (ref 32.0–36.0)
MCHC: 33.6 g/dL (ref 32.0–36.0)
MCV: 95.4 fL (ref 80.0–100.0)
MCV: 94.8 fL (ref 80.0–100.0)
PLATELETS: 209 10*3/uL (ref 150–440)
PLATELETS: 218 10*3/uL (ref 150–440)
RBC: 3.31 MIL/uL — ABNORMAL LOW (ref 3.80–5.20)
RBC: 3.03 MIL/uL — AB (ref 3.80–5.20)
RDW: 17.3 % — ABNORMAL HIGH (ref 11.5–14.5)
RDW: 17.8 % — AB (ref 11.5–14.5)
WBC: 10.1 10*3/uL (ref 3.6–11.0)
WBC: 9.4 10*3/uL (ref 3.6–11.0)

## 2017-05-25 LAB — RENAL FUNCTION PANEL
Albumin: 2.5 g/dL — ABNORMAL LOW (ref 3.5–5.0)
Anion gap: 7 (ref 5–15)
BUN: 8 mg/dL (ref 6–20)
CALCIUM: 7.8 mg/dL — AB (ref 8.9–10.3)
CHLORIDE: 99 mmol/L — AB (ref 101–111)
CO2: 30 mmol/L (ref 22–32)
CREATININE: 1.79 mg/dL — AB (ref 0.44–1.00)
GFR calc Af Amer: 27 mL/min — ABNORMAL LOW (ref >60)
GFR, EST NON AFRICAN AMERICAN: 23 mL/min — AB (ref >60)
Glucose, Bld: 179 mg/dL — ABNORMAL HIGH (ref 65–99)
Phosphorus: 1.5 mg/dL — ABNORMAL LOW (ref 2.5–4.6)
Potassium: 4 mmol/L (ref 3.5–5.1)
SODIUM: 136 mmol/L (ref 135–145)

## 2017-05-25 LAB — HEMOGLOBIN AND HEMATOCRIT, BLOOD
HCT: 29.9 % — ABNORMAL LOW (ref 35.0–47.0)
HEMATOCRIT: 28.8 % — AB (ref 35.0–47.0)
HEMATOCRIT: 29.7 % — AB (ref 35.0–47.0)
HEMOGLOBIN: 9.9 g/dL — AB (ref 12.0–16.0)
Hemoglobin: 9.5 g/dL — ABNORMAL LOW (ref 12.0–16.0)
Hemoglobin: 9.9 g/dL — ABNORMAL LOW (ref 12.0–16.0)

## 2017-05-25 LAB — BPAM RBC
BLOOD PRODUCT EXPIRATION DATE: 201906112359
ISSUE DATE / TIME: 201905202004
UNIT TYPE AND RH: 5100

## 2017-05-25 LAB — TYPE AND SCREEN
ABO/RH(D): O POS
Antibody Screen: NEGATIVE
Unit division: 0

## 2017-05-25 LAB — GLUCOSE, CAPILLARY
GLUCOSE-CAPILLARY: 129 mg/dL — AB (ref 65–99)
GLUCOSE-CAPILLARY: 155 mg/dL — AB (ref 65–99)
Glucose-Capillary: 141 mg/dL — ABNORMAL HIGH (ref 65–99)
Glucose-Capillary: 187 mg/dL — ABNORMAL HIGH (ref 65–99)

## 2017-05-25 MED ORDER — AMLODIPINE BESYLATE 10 MG PO TABS
10.0000 mg | ORAL_TABLET | Freq: Every day | ORAL | Status: DC
  Administered 2017-05-26: 10 mg via ORAL
  Filled 2017-05-25: qty 1

## 2017-05-25 NOTE — Progress Notes (Signed)
Pt had brown stool today.

## 2017-05-25 NOTE — Discharge Summary (Signed)
[facility] NAME: [name]    MR#:  [id number]  DATE OF BIRTH:  1920/04/04  DATE OF ADMISSION:  05/17/2017 ADMITTING PHYSICIAN: Bettey Costa, MD  DATE OF DISCHARGE: 05/24/17  PRIMARY CARE PHYSICIAN: [name], MD    ADMISSION DIAGNOSIS:  Pneumonia [J18.9] Acute GI bleeding [K92.2] Healthcare-associated pneumonia [J18.9]  DISCHARGE DIAGNOSIS:  Active Problems:   GIB (gastrointestinal bleeding)   Pressure injury of skin   SECONDARY DIAGNOSIS:   Past Medical History:  Diagnosis Date  . Anemia    NOS  . Anxiety   . Aortic stenosis   . Cancer [facility])    Mole on top of head to be removed in July 2013  . Chronic diastolic CHF (congestive heart failure) ([place])    a. 01/2017 Echo: EF 60-65%, no rwma, Gr2 DD, mild to mod MR, mildly dil LA/RA, nl RV fxn, Sev TR, PASP 6mmHg.  . Constipation   . Coronary artery disease    a. 2009 CABG x 3 w/ bioprosthetic AVR.  . Diabetes mellitus    type II;was on Glipizide but has been off x 79mon  . Dialysis patient ([place])   . Diarrhea   . ESRD (end stage renal disease) ([place])   . GERD (gastroesophageal reflux disease)   . History of blood transfusion   . Hyperlipidemia    takes Simvastatin nightly  . Hypertension    takes Metoprolol daily  . Hypothyroidism    takes Synthroid daily  . Migraine    hx of  . Oligouria   . Osteoarthritis   . Osteopenia   . Peripheral vascular disease ([place])   . Personal history of colonic polyps   . Pulmonary hypertension ([place])   . Urinary incontinence   . UTI (urinary tract infection)     HOSPITAL COURSE:     1. Acute blood loss anemia with burgundy looking stool.  GI  did endoscopy and colonoscopy 5/17.  EGD has revealed hiatal hernia and colonoscopy has revealed hemorrhoids, diverticulosis and rectal ulcer which could be the etiology of the bleed.  Hemoglobin is stable and patient is hemodynamically stable continue medications for C.  difficile.  Flagyl and vancomycin for 13 more days and outpatient GI follow-up in 2 weeks, 05/20/2017 transfused 2 units of packed red blood cells with good response.  Continue to hold aspirin and Plavix as recommended by gastroenterology as she is at high risk for GI bleed if we resume aspirin and Plavix.  Patient on Protonix 40 mg IV twice daily  Will change to p.o. continue stool softeners for better healing of the rectal ulcer .Hemoglobin at 9.9, no other episodes of bleeding in the past 24 hours.  Okay to discharge patient from GI standpoint 2. C. difficile colitis.   patient on oral vancomycin and  Flagyl continue for a total of 2 weeks and outpatient follow-up with gastroenterology in 2 weeks is recommended.  As per my discussion with Dr. Alice Reichert recommending to continue p.o. Flagyl and vancomycin for a total of 2 weeks starting from  05/22/2017 3. Transient atrial fibrillation converted to normal sinus rhythm.  Restart metoprolol since blood pressure is better. 4. Hyperlipidemia on pravastatin 5. End-stage renal disease.  Continue hemodialysis on Monday Wednesday and Friday. 6. Hypothyroidism unspecified on levothyroxine 7. History of CAD.  Unable to give blood thinner at this time. 8. Recent history of pelvic fracture with weakness.  Physical therapy recommended to go back to [facility] Disposition to rehab center at [facility] anticipating tomorrow    DISCHARGE CONDITIONS:   fair  CONSULTS OBTAINED:     PROCEDURES   EGD and colonoscopy  DRUG ALLERGIES:   Allergies  Allergen Reactions  . Nitrofurantoin Itching  . Captopril Other (See Comments)    REACTION: unspecified  . Enalapril Maleate Cough  . Ramipril Other (See Comments)    REACTION: unspecified  . Sulfa Antibiotics Other (See Comments)    Reaction unknown  . Verapamil Other (See Comments)    REACTION: unspecified    DISCHARGE MEDICATIONS:   Allergies as of 05/26/2017      Reactions    Nitrofurantoin Itching   Captopril Other (See Comments)   REACTION: unspecified   Enalapril Maleate Cough   Ramipril Other (See Comments)   REACTION: unspecified   Sulfa Antibiotics Other (See Comments)   Reaction unknown   Verapamil Other (See Comments)   REACTION: unspecified      Medication List    STOP taking these medications   amoxicillin-clavulanate 500-125 MG tablet Commonly known as:  AUGMENTIN   aspirin EC 81 MG tablet   clopidogrel 75 MG tablet Commonly known as:  PLAVIX     TAKE these medications   acetaminophen 325 MG tablet Commonly known as:  TYLENOL Take 1 tablet (325 mg total) by mouth every 6 (six) hours as needed for mild pain or fever (or Fever >/= 101).   albuterol (2.5 MG/3ML) 0.083% nebulizer solution Commonly known as:  PROVENTIL Take 3 mLs (2.5 mg total) by nebulization every 2 (two) hours as needed for wheezing.   amLODipine 10 MG tablet Commonly known as:  NORVASC Take 1 tablet (10 mg total) by mouth daily. Start taking on:  05/27/2017 What changed:    medication strength  how much to take   ascorbic acid 500 MG tablet Commonly known as:  VITAMIN C Take 1 tablet (500 mg total) by mouth daily. What changed:  when to take this   calcium carbonate 500 MG chewable tablet Commonly known as:  TUMS - dosed in mg elemental calcium Chew 1 tablet (200 mg of elemental calcium total) by mouth 3 (three) times daily with meals.   epoetin alfa 10000 UNIT/ML injection Commonly known as:  EPOGEN,PROCRIT Inject 1 mL (10,000 Units total) into the vein every Monday, Wednesday, and Friday with hemodialysis.   feeding supplement (ENSURE ENLIVE) Liqd Take 237 mLs by mouth 3 (three) times daily between meals.   feeding supplement (PRO-STAT SUGAR FREE 64) Liqd Take 30 mLs by mouth 3 (three) times daily with meals.   folic acid 0.5 MG tablet Commonly known as:  FOLVITE Take 0.5 tablets (0.5 mg total) by mouth daily.   Ganciclovir 0.15 % Gel Commonly  known as:  ZIRGAN Place 1 drop into the right eye 4 (four) times daily.   hydrALAZINE 25 MG tablet Commonly known as:  APRESOLINE Take 25 mg by mouth 3 (three) times daily.   HYDROcodone-acetaminophen 5-325 MG tablet Commonly known as:  NORCO/VICODIN Take 1-2 tablets by mouth every 4 (four) hours as needed for moderate pain.   insulin aspart 100 UNIT/ML injection Commonly known as:  novoLOG Inject 0-9 Units into the skin 3 (three) times daily with meals. CBG < 70: implement hypoglycemia protocol CBG 70 - 120: 0 units CBG 121 - 150: 1 unit CBG 151 - 200: 2 units CBG 201 - 250: 3 units CBG 251 - 300: 5 units CBG 301 - 350: 7 units CBG  351 - 400: 9 units CBG > 400: call MD and obtain STAT lab verification   levothyroxine 75 MCG tablet Commonly known as:  SYNTHROID, LEVOTHROID TAKE ONE TABLET BY MOUTH ONCE DAILY   metoprolol tartrate 50 MG tablet Commonly known as:  LOPRESSOR TAKE ONE TABLET BY MOUTH TWICE DAILY   metroNIDAZOLE 500 MG tablet Commonly known as:  FLAGYL Take 1 tablet (500 mg total) by mouth every 8 (eight) hours for 6 days.   multivitamin Tabs tablet Take 1 tablet by mouth at bedtime.   ondansetron 4 MG tablet Commonly known as:  ZOFRAN Take 1 tablet (4 mg total) by mouth every 6 (six) hours as needed for nausea.   pantoprazole 40 MG tablet Commonly known as:  PROTONIX Take 1 tablet (40 mg total) by mouth 2 (two) times daily before a meal.   polyethylene glycol packet Commonly known as:  MIRALAX / GLYCOLAX Take 17 g by mouth daily as needed for mild constipation.   polyvinyl alcohol 1.4 % ophthalmic solution Commonly known as:  LIQUIFILM TEARS Place 1 drop into both eyes as needed for dry eyes.   pravastatin 40 MG tablet Commonly known as:  PRAVACHOL TAKE ONE TABLET BY MOUTH IN THE EVENING   senna-docusate 8.6-50 MG tablet Commonly known as:  Senokot-S Take 1 tablet by mouth at bedtime as needed for mild constipation.   vancomycin 50 mg/mL oral  solution Commonly known as:  VANCOCIN Take 10 mLs (500 mg total) by mouth every 6 (six) hours for 6 days.   zinc sulfate 220 (50 Zn) MG capsule Take 220 mg by mouth daily.        DISCHARGE INSTRUCTIONS:   Follow-up with primary care physician at the facility in 3 to 5 days, please monitor patient's hemoglobin and hematocrit Follow-up with gastroenterology in 2 weeks Continue hemodialysis on Monday Wednesday and Friday Follow-up with nephrology in a week  DIET:  Renal diet  DISCHARGE CONDITION:  Fair  ACTIVITY:  Activity as tolerated  OXYGEN:  Home Oxygen: No.   Oxygen Delivery: room air  DISCHARGE LOCATION:  nursing home   If you experience worsening of your admission symptoms, develop shortness of breath, life threatening emergency, suicidal or homicidal thoughts you must seek medical attention immediately by calling 911 or calling your MD immediately  if symptoms less severe.  You Must read complete instructions/literature along with all the possible adverse reactions/side effects for all the Medicines you take and that have been prescribed to you. Take any new Medicines after you have completely understood and accpet all the possible adverse reactions/side effects.   Please note  You were cared for by a hospitalist during your hospital stay. If you have any questions about your discharge medications or the care you received while you were in the hospital after you are discharged, you can call the unit and asked to speak with the hospitalist on call if the hospitalist that took care of you is not available. Once you are discharged, your primary care physician will handle any further medical issues. Please note that NO REFILLS for any discharge medications will be authorized once you are discharged, as it is imperative that you return to your primary care physician (or establish a relationship with a primary care physician if you do not have one) for your aftercare needs so  that they can reassess your need for medications and monitor your lab values.     Today  Chief Complaint  Patient presents with  . Rectal  Bleeding   Patient is resting comfortably.  Denies any other episodes of bleeding  ROS:  CONSTITUTIONAL: Denies fevers, chills. Denies any fatigue, weakness.  EYES: Denies blurry vision, double vision, eye pain. EARS, NOSE, THROAT: Denies tinnitus, ear pain, hearing loss. RESPIRATORY: Denies cough, wheeze, shortness of breath.  CARDIOVASCULAR: Denies chest pain, palpitations, edema.  GASTROINTESTINAL: Denies nausea, vomiting, diarrhea, abdominal pain. Denies bright red blood per rectum. GENITOURINARY: Denies dysuria, hematuria. ENDOCRINE: Denies nocturia or thyroid problems. HEMATOLOGIC AND LYMPHATIC: Denies easy bruising or bleeding. SKIN: Denies rash or lesion. MUSCULOSKELETAL: Denies pain in neck, back, shoulder, knees, hips or arthritic symptoms.  NEUROLOGIC: Denies paralysis, paresthesias.  PSYCHIATRIC: Denies anxiety or depressive symptoms.   VITAL SIGNS:  Blood pressure (!) 153/52, pulse 94, temperature 98.4 F (36.9 C), temperature source Oral, resp. rate (!) 22, height 5\' 3"  (1.6 m), weight 45 kg (99 lb 3.3 oz), SpO2 94 %.  I/O:    Intake/Output Summary (Last 24 hours) at 05/26/2017 1451 Last data filed at 05/26/2017 1339 Gross per 24 hour  Intake 0 ml  Output 311 ml  Net -311 ml    PHYSICAL EXAMINATION:  GENERAL:  84 y.o.-year-old patient lying in the bed with no acute distress.  EYES: Pupils equal, round, reactive to light and accommodation. No scleral icterus. Extraocular muscles intact.  HEENT: Head atraumatic, normocephalic. Oropharynx and nasopharynx clear.  NECK:  Supple, no jugular venous distention. No thyroid enlargement, no tenderness.  LUNGS: Normal breath sounds bilaterally, no wheezing, rales,rhonchi or crepitation. No use of accessory muscles of respiration.  CARDIOVASCULAR: S1, S2 normal. No murmurs, rubs, or  gallops.  ABDOMEN: Soft, non-tender, non-distended. Bowel sounds present. No organomegaly or mass.  EXTREMITIES: No pedal edema, cyanosis, or clubbing.  NEUROLOGIC: Cranial nerves II through XII are intact. Muscle strength 5/5 in all extremities. Sensation intact. Gait not checked.  PSYCHIATRIC: The patient is alert and oriented x 3.  SKIN: No obvious rash, lesion, or ulcer.   DATA REVIEW:   CBC Recent Labs  Lab 05/26/17 0426  WBC 8.7  HGB 9.9*  HCT 29.5*  PLT 228    Chemistries  Recent Labs  Lab 05/25/17 0051  NA 136  K 4.0  CL 99*  CO2 30  GLUCOSE 179*  BUN 8  CREATININE 1.79*  CALCIUM 7.8*    Cardiac Enzymes No results for input(s): TROPONINI in the last 168 hours.  Microbiology Results  Results for orders placed or performed during the hospital encounter of 05/17/17  Blood culture (routine x 2)     Status: None   Collection Time: 05/17/17 11:16 AM  Result Value Ref Range Status   Specimen Description BLOOD BLOOD RIGHT HAND  Final   Special Requests   Final    BOTTLES DRAWN AEROBIC AND ANAEROBIC Blood Culture adequate volume   Culture   Final    NO GROWTH 5 DAYS Performed at [facility], [address] [place], [place] [address]    Report Status 05/22/2017 FINAL  Final  Blood culture (routine x 2)     Status: None   Collection Time: 05/17/17 11:16 AM  Result Value Ref Range Status   Specimen Description BLOOD FOREARM  Final   Special Requests   Final    BOTTLES DRAWN AEROBIC AND ANAEROBIC Blood Culture adequate volume   Culture   Final    NO GROWTH 5 DAYS Performed at [facility], [address], [place], [place] [address]    Report Status 05/22/2017 FINAL  Final  C difficile quick scan w PCR reflex     Status: Abnormal   Collection Time: 05/17/17  6:25 PM  Result Value Ref Range Status   C Diff antigen POSITIVE (A) NEGATIVE Final   C Diff toxin POSITIVE (A) NEGATIVE Final   C Diff interpretation Toxin producing C. difficile  detected.  Final    Comment: RESULT CALLED TO, READ BACK BY AND VERIFIED WITH: KIRSTEN JOYNER @1952  05/17/17 AKT Performed at [facility], [address] [place], [place] [address]     RADIOLOGY:  Dg Chest 2 View  Result Date: 05/22/2017 CLINICAL DATA:  Dialysis patient with C difficile. Cough. History EGD and colonoscopy on 05/21/2017. EXAM: CHEST - 2 VIEW COMPARISON:  05/19/2017 CXR FINDINGS: Stable cardiomegaly with moderate aortic atherosclerosis. Status post CABG and aortic valvular replacement. Interval increase in small to moderate right pleural effusion with stable small left effusion. Interval increase in interstitial edema. Vascular stent projects over the distal left subclavian. IMPRESSION: 1. Interval increase in small to moderate right-sided pleural effusion with vascular congestion. 2. Stable cardiomegaly with post CABG and aortic valvular replacement. 3. Aortic atherosclerosis. Electronically Signed   By: Ashley Royalty M.D.   On: 05/22/2017 18:26    EKG:   Orders placed or performed during the hospital encounter of 05/17/17  . ED EKG  . ED EKG  . EKG 12-Lead  . EKG 12-Lead  . EKG  . EKG 12-Lead  . EKG 12-Lead      Management plans discussed with the patient, family and they are in agreement.  CODE STATUS:     Code Status Orders  (From admission, onward)        Start     Ordered   05/17/17 1347  Do not attempt resuscitation (DNR)  Continuous    Question Answer Comment  In the event of cardiac or respiratory ARREST Do not call a "code blue"   In the event of cardiac or respiratory ARREST Do not perform Intubation, CPR, defibrillation or ACLS   In the event of cardiac or respiratory ARREST Use medication by any route, position, wound care, and other measures to relive pain and suffering. May use oxygen, suction and manual treatment of airway obstruction as needed for comfort.      05/17/17 1346    Code Status History    Date Active Date Inactive  Code Status Order ID Comments User Context   05/05/2017 1547 05/07/2017 2121 DNR [id number]  Hillary Bow, MD ED   04/26/2017 1709 04/28/2017 2046 DNR [id number]  Demetrios Loll, MD Inpatient   01/03/2017 1328 01/08/2017 2014 DNR [id number]  Idelle Crouch, MD Inpatient   01/03/2017 1031 01/03/2017 1328 DNR [id number]  Merlyn Lot, MD ED   09/18/2015 1817 09/22/2015 1920 DNR [id number]  Fritzi Mandes, MD Inpatient   09/18/2015 1516 09/18/2015 1817 DNR [id number]  Flora Lipps, MD ED   03/22/2013 2100 03/26/2013 1540 Full Code [id number]  Rise Patience, MD Inpatient   11/20/2012 1756 12/06/2012 2008 DNR [id number]  Annita Brod, MD Inpatient   06/18/2011 1042 06/19/2011 1401 Full Code [id number]  Paulene Floor, RN Inpatient   12/14/2010 1124 12/29/2010 1543 DNR [id number]  Bonnielee Haff, MD Inpatient   12/13/2010 0113 12/14/2010 1123 Full Code [id number]  Sid Falcon, RN Inpatient   11/22/2010 0216 11/28/2010 2239 DNR [id number]  Viviano Simas, RN Inpatient    Advance Directive Documentation     Most Recent Value  Type of Advance Directive  Healthcare Power  of Attorney, Living will, Out of facility DNR (pink MOST or yellow form)  Pre-existing out of facility DNR order (yellow form or pink MOST form)  Pink MOST form placed in chart (order not valid for inpatient use)  "MOST" Form in Place?  -      TOTAL TIME TAKING CARE OF THIS PATIENT: 43 minutes.   Note: This dictation was prepared with Dragon dictation along with smaller phrase technology. Any transcriptional errors that result from this process are unintentional.   @[facility] @  on 05/26/2017 at 2:51 PM  Between 7am to 6pm - Pager - [phone]  After 6pm go to [email or web address] - password [email or web address] [facility]  Office  [phone]  CC: Primary care physician; [name], MD

## 2017-05-25 NOTE — Progress Notes (Signed)
[facility]  ROUNDING NOTE   Subjective:   Patient is doing fair Denies acute c/o Appetite is low No SOB   Objective:  Vital signs in last 24 hours:  Temp:  [97.9 F (36.6 C)-98.6 F (37 C)] 98.4 F (36.9 C) (05/21 1148) Pulse Rate:  [80-107] 80 (05/21 1148) Resp:  [18-24] 18 (05/21 0549) BP: (130-184)/(42-70) 165/53 (05/21 1148) SpO2:  [96 %-100 %] 99 % (05/21 1148) Weight:  [46.8 kg (103 lb 2.8 oz)-46.9 kg (103 lb 6.3 oz)] 46.8 kg (103 lb 2.8 oz) (05/20 2319)  Weight change:  Filed Weights   05/24/17 1500 05/24/17 1930 05/24/17 2319  Weight: 45.1 kg (99 lb 6.8 oz) 46.9 kg (103 lb 6.3 oz) 46.8 kg (103 lb 2.8 oz)    Intake/Output: I/O last 3 completed shifts: In: -  Out: 5 [Other:5]   Intake/Output this shift:  No intake/output data recorded.  Physical Exam: General: NAD, laying in bed  Head: Normocephalic, atraumatic. Moist oral mucosal membranes  Eyes: Anicteric,    Neck: Supple, trachea midline  Lungs:  Clear to auscultation  Heart:  no rub  Abdomen:  Soft, nontender,   Extremities: no peripheral edema.  Neurologic: Alert and oriented; decreased hearing  Skin: No lesions  Access: Left AVF    Basic Metabolic Panel: Recent Labs  Lab 05/20/17 0910 05/21/17 0833 05/25/17 0051  NA 137 139 136  K 3.7 3.9 4.0  CL 102 101 99*  CO2 29 29 30   GLUCOSE 158* 103* 179*  BUN 12 16 8   CREATININE 2.31* 3.47* 1.79*  CALCIUM 7.8* 8.2* 7.8*  PHOS 2.7 3.0 1.5*    Liver Function Tests: Recent Labs  Lab 05/20/17 0910 05/21/17 0833 05/25/17 0051  ALBUMIN 2.3* 2.4* 2.5*   No results for input(s): LIPASE, AMYLASE in the last 168 hours. No results for input(s): AMMONIA in the last 168 hours.  CBC: Recent Labs  Lab 05/21/17 0836  05/23/17 0655 05/24/17 0931 05/24/17 1958 05/25/17 0051 05/25/17 0443 05/25/17 1410  WBC 7.7  --  8.4 8.5  --  9.4 10.1  --   HGB 8.3*   < > 8.5* 7.3* 6.8* 10.6* 9.7* 9.9*  HCT 24.0*  --  25.2* 21.9* 20.1* 31.4*  28.9* 29.9*  MCV 92.9  --  96.5 96.8  --  94.8 95.4  --   PLT 249  --  238 245  --  218 209  --    < > = values in this interval not displayed.    Cardiac Enzymes: No results for input(s): CKTOTAL, CKMB, CKMBINDEX, TROPONINI in the last 168 hours.  BNP: Invalid input(s): POCBNP  CBG: Recent Labs  Lab 05/24/17 0939 05/24/17 1150 05/24/17 1654 05/25/17 0731 05/25/17 1147  GLUCAP 161* 150* 195* 129* 155*    Microbiology: Results for orders placed or performed during the hospital encounter of 05/17/17  Blood culture (routine x 2)     Status: None   Collection Time: 05/17/17 11:16 AM  Result Value Ref Range Status   Specimen Description BLOOD BLOOD RIGHT HAND  Final   Special Requests   Final    BOTTLES DRAWN AEROBIC AND ANAEROBIC Blood Culture adequate volume   Culture   Final    NO GROWTH 5 DAYS Performed at [facility], [address], [place], [place] [address]    Report Status 05/22/2017 FINAL  Final  Blood culture (routine x 2)     Status: None   Collection Time: 05/17/17 11:16 AM  Result Value Ref Range Status   Specimen Description BLOOD FOREARM  Final   Special Requests   Final    BOTTLES DRAWN AEROBIC AND ANAEROBIC Blood Culture adequate volume   Culture   Final    NO GROWTH 5 DAYS Performed at [facility], [address] [place], [place] [address]    Report Status 05/22/2017 FINAL  Final  C difficile quick scan w PCR reflex     Status: Abnormal   Collection Time: 05/17/17  6:25 PM  Result Value Ref Range Status   C Diff antigen POSITIVE (A) NEGATIVE Final   C Diff toxin POSITIVE (A) NEGATIVE Final   C Diff interpretation Toxin producing C. difficile detected.  Final    Comment: RESULT CALLED TO, READ BACK BY AND VERIFIED WITH: KIRSTEN JOYNER @1952  05/17/17 AKT Performed at [facility], [address] [place], [place] [address]     Coagulation Studies: No results for input(s): LABPROT, INR in the last 72  hours.  Urinalysis: No results for input(s): COLORURINE, LABSPEC, PHURINE, GLUCOSEU, HGBUR, BILIRUBINUR, KETONESUR, PROTEINUR, UROBILINOGEN, NITRITE, LEUKOCYTESUR in the last 72 hours.  Invalid input(s): APPERANCEUR    Imaging: No results found.   Medications:   . sodium chloride     . amLODipine  5 mg Oral Daily  . calcium carbonate  1 tablet Oral TID WC  . epoetin (EPOGEN/PROCRIT) injection  10,000 Units Intravenous Q M,W,F-HD  . feeding supplement (ENSURE ENLIVE)  237 mL Oral TID BM  . folic acid  0.5 mg Oral Daily  . Ganciclovir  1 drop Right Eye QID  . insulin aspart  0-9 Units Subcutaneous TID WC  . levothyroxine  75 mcg Oral BH-q7a  . metoprolol tartrate  50 mg Oral BID  . metroNIDAZOLE  500 mg Oral Q8H  . multivitamin  1 tablet Oral QHS  . pantoprazole  40 mg Oral BID AC  . polyethylene glycol-electrolytes  4,000 mL Oral Once  . pravastatin  40 mg Oral QPM  . vancomycin  500 mg Oral Q6H  . ascorbic acid  250 mg Oral BID   acetaminophen, albuterol, HYDROcodone-acetaminophen, ondansetron **OR** ondansetron (ZOFRAN) IV, polyethylene glycol, polyvinyl alcohol, senna-docusate  Assessment/ Plan:  [name] is a 84 y.o. white female with end stage renal disease on hemodialysis, coronary artery disease, history of bilateral knee replacement, aortic valve replacement 2009, diabetes, hypertension, hypothyroidism, peripheral vascular disease, pulmonary hypertension, anxiety  [facility] [address]/MWF  1. End Stage Renal Disease:   -  MWF schedule.   2.  Anemia of chronic kidney disease: with GI bleed from rectal ulcer - Status post PRBC transfusions on 5/13, 5/15, 5/20 - mircera as outpatient - EPO with HD treatments - Appreciate GI input  3. Secondary Hyperparathyroidism:  - tums with meals.   4. C. Diff colitis: - PO vanco and metronidazole.     LOS: [place] 5/21/20193:48 PM

## 2017-05-25 NOTE — Progress Notes (Signed)
Patient had an episode of bloody stool during dialysis.

## 2017-05-25 NOTE — Progress Notes (Signed)
Patient ID: [name], female   DOB: 06-02-1920, 84 y.o.   MRN: [id number]  [facility] PROGRESS NOTE  [name] ZJQ:[id number] DOB: 08/13/1920 DOA: 05/17/2017 PCP: [name], MD  HPI/Subjective: Patient with no  abdominal pain. As per nursing staff patient has had small blood clots per rectum today early hours.  patient had EGD and colonoscopy on 05/21/2017.  Patient had hemodialysis yesterday and was transfused 1 unit of blood yesterday during dialysis.  Hemoglobin -10.6-9.7-9.9  Objective: Vitals:   05/25/17 0549 05/25/17 1148  BP: (!) 154/45 (!) 165/53  Pulse: 89 80  Resp: 18   Temp: 98.6 F (37 C) 98.4 F (36.9 C)  SpO2: 100% 99%    Filed Weights   05/24/17 1500 05/24/17 1930 05/24/17 2319  Weight: 45.1 kg (99 lb 6.8 oz) 46.9 kg (103 lb 6.3 oz) 46.8 kg (103 lb 2.8 oz)    ROS: Review of Systems  Constitutional: Negative for chills and fever.  HENT: Positive for hearing loss.   Respiratory: Negative for shortness of breath.   Cardiovascular: Negative for chest pain.  Gastrointestinal: Negative for abdominal pain, nausea and vomiting.  Genitourinary: Negative for dysuria.  Musculoskeletal: Negative for joint pain.  Neurological: Negative for dizziness.   Exam: Physical Exam  HENT:  Nose: No mucosal edema.  Mouth/Throat: No oropharyngeal exudate or posterior oropharyngeal edema.  Eyes: Pupils are equal, round, and reactive to light. Conjunctivae, EOM and lids are normal.  Neck: No JVD present. Carotid bruit is not present. No edema present. No thyroid mass and no thyromegaly present.  Cardiovascular: S1 normal and S2 normal. Exam reveals no gallop.  No murmur heard. Pulses:      Dorsalis pedis pulses are 2+ on the right side, and 2+ on the left side.  Respiratory: No respiratory distress. She has decreased breath sounds in the right lower field and the left lower field. She has no wheezes. She has rhonchi in the right lower field and the left lower  field. She has no rales.  GI: Soft. Bowel sounds are normal. There is no tenderness.  Musculoskeletal:       Right ankle: She exhibits no swelling.       Left ankle: She exhibits no swelling.  Lymphadenopathy:    She has no cervical adenopathy.  Neurological: She is alert.  Skin: Skin is warm. Nails show no clubbing.  Psychiatric: She has a normal mood and affect.      Data Reviewed: Basic Metabolic Panel: Recent Labs  Lab 05/20/17 0910 05/21/17 0833 05/25/17 0051  NA 137 139 136  K 3.7 3.9 4.0  CL 102 101 99*  CO2 29 29 30   GLUCOSE 158* 103* 179*  BUN 12 16 8   CREATININE 2.31* 3.47* 1.79*  CALCIUM 7.8* 8.2* 7.8*  PHOS 2.7 3.0 1.5*   Liver Function Tests: Recent Labs  Lab 05/20/17 0910 05/21/17 0833 05/25/17 0051  ALBUMIN 2.3* 2.4* 2.5*    CBC: Recent Labs  Lab 05/21/17 0836  05/23/17 0655 05/24/17 0931 05/24/17 1958 05/25/17 0051 05/25/17 0443 05/25/17 1410  WBC 7.7  --  8.4 8.5  --  9.4 10.1  --   HGB 8.3*   < > 8.5* 7.3* 6.8* 10.6* 9.7* 9.9*  HCT 24.0*  --  25.2* 21.9* 20.1* 31.4* 28.9* 29.9*  MCV 92.9  --  96.5 96.8  --  94.8 95.4  --   PLT 249  --  238 245  --  218 209  --    < > =  values in this interval not displayed.   Cardiac Enzymes: No results for input(s): CKTOTAL, CKMB, CKMBINDEX, TROPONINI in the last 168 hours.  CBG: Recent Labs  Lab 05/24/17 0939 05/24/17 1150 05/24/17 1654 05/25/17 0731 05/25/17 1147  GLUCAP 161* 150* 195* 129* 155*    Recent Results (from the past 240 hour(s))  Blood culture (routine x 2)     Status: None   Collection Time: 05/17/17 11:16 AM  Result Value Ref Range Status   Specimen Description BLOOD BLOOD RIGHT HAND  Final   Special Requests   Final    BOTTLES DRAWN AEROBIC AND ANAEROBIC Blood Culture adequate volume   Culture   Final    NO GROWTH 5 DAYS Performed at [facility], [address], [place], [place] [address]    Report Status 05/22/2017 FINAL  Final  Blood culture (routine x  2)     Status: None   Collection Time: 05/17/17 11:16 AM  Result Value Ref Range Status   Specimen Description BLOOD FOREARM  Final   Special Requests   Final    BOTTLES DRAWN AEROBIC AND ANAEROBIC Blood Culture adequate volume   Culture   Final    NO GROWTH 5 DAYS Performed at [facility], [address], [place], [place] [address]    Report Status 05/22/2017 FINAL  Final  C difficile quick scan w PCR reflex     Status: Abnormal   Collection Time: 05/17/17  6:25 PM  Result Value Ref Range Status   C Diff antigen POSITIVE (A) NEGATIVE Final   C Diff toxin POSITIVE (A) NEGATIVE Final   C Diff interpretation Toxin producing C. difficile detected.  Final    Comment: RESULT CALLED TO, READ BACK BY AND VERIFIED WITH: KIRSTEN JOYNER @1954  05/17/17 AKT Performed at [facility], [address], [place], [place] [address]      Studies: No results found.  Scheduled Meds: . amLODipine  5 mg Oral Daily  . calcium carbonate  1 tablet Oral TID WC  . epoetin (EPOGEN/PROCRIT) injection  10,000 Units Intravenous Q M,W,F-HD  . feeding supplement (ENSURE ENLIVE)  237 mL Oral TID BM  . folic acid  0.5 mg Oral Daily  . Ganciclovir  1 drop Right Eye QID  . insulin aspart  0-9 Units Subcutaneous TID WC  . levothyroxine  75 mcg Oral BH-q7a  . metoprolol tartrate  50 mg Oral BID  . metroNIDAZOLE  500 mg Oral Q8H  . multivitamin  1 tablet Oral QHS  . pantoprazole  40 mg Oral BID AC  . polyethylene glycol-electrolytes  4,000 mL Oral Once  . pravastatin  40 mg Oral QPM  . vancomycin  500 mg Oral Q6H  . ascorbic acid  250 mg Oral BID   Continuous Infusions: . sodium chloride      Assessment/Plan:  1. Acute blood loss anemia with burgundy looking stool.  GI  did endoscopy and colonoscopy 5/17.  EGD has revealed hiatal hernia and colonoscopy has revealed hemorrhoids, diverticulosis and rectal ulcer which could be the etiology of the bleed.  Biopsies are pending hemoglobin at  9.9 today after yesterday's blood transfusion continue medications for C. difficile.  Flagyl and vancomycin for 12 more days and outpatient GI follow-up in 2 weeks, 05/20/2017 transfused 2 units of packed red blood cells and 1 unit of blood transfusion on 05/24/2017.  Continue to hold aspirin and Plavix as recommended by gastroenterology as she is at high risk for GI bleed if  we resume aspirin and Plavix.  Patient on Protonix 40 mg IV twice daily  Will change to p.o. continue stool softeners for better healing of the rectal ulcer . 2. C. difficile colitis.   patient on oral vancomycin and IV Flagyl continue for 13 more days and outpatient follow-up with gastroenterology in 2 weeks is recommended.  As per my discussion with Dr. Alice Reichert recommending to continue p.o. Flagyl and vancomycin for a total of 2 weeks starting from  05/22/2017 3. Transient atrial fibrillation converted to normal sinus rhythm.  Restart metoprolol since blood pressure is better. 4. Hypertension resume metoprolol 50 mg and amlodipine dose increased to 10 mg and titrate as needed 5. End-stage renal disease.  Continue hemodialysis on Monday Wednesday and Friday. 6. Hypothyroidism unspecified on levothyroxine 7. History of CAD.  Unable to give blood thinner at this time. 8. Recent history of pelvic fracture with weakness.  Physical therapy recommended to go back to rehab at [facility] Disposition to rehab center at [facility] anticipating tomorrow  Code Status:     Code Status Orders  (From admission, onward)        Start     Ordered   05/17/17 1347  Do not attempt resuscitation (DNR)  Continuous    Question Answer Comment  In the event of cardiac or respiratory ARREST Do not call a "code blue"   In the event of cardiac or respiratory ARREST Do not perform Intubation, CPR, defibrillation or ACLS   In the event of cardiac or respiratory ARREST Use medication by any route, position, wound care, and other measures to relive  pain and suffering. May use oxygen, suction and manual treatment of airway obstruction as needed for comfort.      05/17/17 1346    Code Status History    Date Active Date Inactive Code Status Order ID Comments User Context   05/05/2017 1547 05/07/2017 2121 DNR [id number]  Hillary Bow, MD ED   04/26/2017 1709 04/28/2017 2046 DNR [id number]  Demetrios Loll, MD Inpatient   01/03/2017 1328 01/08/2017 2014 DNR [id number]  Idelle Crouch, MD Inpatient   01/03/2017 1031 01/03/2017 1328 DNR [id number]  Merlyn Lot, MD ED   09/18/2015 1817 09/22/2015 1920 DNR [id number]  Fritzi Mandes, MD Inpatient   09/18/2015 1516 09/18/2015 1817 DNR [id number]  Flora Lipps, MD ED   03/22/2013 2100 03/26/2013 1540 Full Code [id number]  Rise Patience, MD Inpatient   11/20/2012 1756 12/06/2012 2008 DNR [id number]  Annita Brod, MD Inpatient   06/18/2011 1042 06/19/2011 1401 Full Code [id number]  Paulene Floor, RN Inpatient   12/14/2010 1124 12/29/2010 1543 DNR [id number]  Bonnielee Haff, MD Inpatient   12/13/2010 0113 12/14/2010 1123 Full Code [id number]  Sid Falcon, RN Inpatient   11/22/2010 0216 11/28/2010 2239 DNR [id number]  Viviano Simas, RN Inpatient    Advance Directive Documentation     Most Recent Value  Type of Advance Directive  Healthcare Power of Attorney, Out of facility DNR (pink MOST or yellow form), Living will  Pre-existing out of facility DNR order (yellow form or pink MOST form)  Pink MOST form placed in chart (order not valid for inpatient use)  "MOST" Form in Place?  -     Disposition Plan: Patient came from a rehab facility and will likely be going back unless the patient gets stronger while here.  Continue physical therapy on a daily basis to try to get this patient stronger.    Consultants:  Gastroenterology  Nephrology  Antibiotics:  oral vancomycin  Oral Flagyl  Time spent:  33 minutes. Case discussed with nephrology AND gi   Wilkinson Tamaj Jurgens  [facility]

## 2017-05-25 NOTE — Progress Notes (Signed)
Physical Therapy Treatment Patient Details Name: [name] MRN: [id number] DOB: 09-01-1920 Today's Date: 05/25/2017    History of Present Illness Pt is a 84 y/o F who with recent diverticular bleed who presented from [facility] due to bright red blood per rectum.  In early am of 5/16 the pt's vitals at HD were irregular with HR in the 30s and SpO2 in the 80s.  Pt with C-diff.  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      PT Comments    Pt in bed, ready to get up.  Min assist to get fully to edge of bed.  Once sitting, remained upright with supervision but remains unsafe to be left unattended.  Stood with min assist and pulled up on walker despite verbal and tactile cues to push from bed.  She was however able to increase her ambulation distances today walking almost to the door and back to recliner.  She requires min assist for gait and upon return to chair she did not turn fully and sat sideways into the chair.  Reported general fatigue.  Safety reviewed.     Follow Up Recommendations  SNF     Equipment Recommendations       Recommendations for Other Services       Precautions / Restrictions Precautions Precautions: Fall;Other (comment) Precaution Comments: monitor vitals Restrictions Weight Bearing Restrictions: No    Mobility  Bed Mobility Overal bed mobility: Needs Assistance Bed Mobility: Supine to Sit     Supine to sit: Min assist     General bed mobility comments: Assist to advance LEs to EOB and assist to elevate trunk.    Transfers Overall transfer level: Needs assistance Equipment used: Rolling walker (2 wheeled) Transfers: Sit to/from Stand              Ambulation/Gait Ambulation/Gait assistance: Min assist Ambulation Distance (Feet): 18 Feet Assistive device: Rolling walker (2 wheeled) Gait Pattern/deviations: Decreased step length -  right;Decreased step length - left;Step-through pattern;Trunk flexed         Stairs             Wheelchair Mobility    Modified Rankin (Stroke Patients Only)       Balance Overall balance assessment: Needs assistance Sitting-balance support: Feet supported;Single extremity supported Sitting balance-Leahy Scale: Poor     Standing balance support: Bilateral upper extremity supported;During functional activity Standing balance-Leahy Scale: Poor Standing balance comment: Pt relies on BUE support for static and dynamic activities                            Cognition Arousal/Alertness: Awake/alert Behavior During Therapy: WFL for tasks assessed/performed Overall Cognitive Status: Within Functional Limits for tasks assessed                                        Exercises      General Comments        Pertinent Vitals/Pain Pain Assessment: No/denies pain    Home Living                      Prior Function            PT Goals (current goals can now be found in the care  plan section) Progress towards PT goals: Progressing toward goals    Frequency    Min 2X/week      PT Plan Current plan remains appropriate    Co-evaluation              AM-PAC PT "6 Clicks" Daily Activity  Outcome Measure  Difficulty turning over in bed (including adjusting bedclothes, sheets and blankets)?: Unable Difficulty moving from lying on back to sitting on the side of the bed? : Unable Difficulty sitting down on and standing up from a chair with arms (e.g., wheelchair, bedside commode, etc,.)?: Unable Help needed moving to and from a bed to chair (including a wheelchair)?: A Little Help needed walking in hospital room?: A Little Help needed climbing 3-5 steps with a railing? : Total 6 Click Score: 10    End of Session Equipment Utilized During Treatment: Gait belt Activity Tolerance: Patient limited by fatigue Patient left: in  chair;with call bell/phone within reach;with chair alarm set         Time: 6553-7482 PT Time Calculation (min) (ACUTE ONLY): 16 min  Charges:  $Gait Training: 8-22 mins                    G Codes:       Chesley Noon, PTA 05/25/17, 10:09 AM

## 2017-05-25 NOTE — Progress Notes (Signed)
Vonda Antigua, MD [address] [place], [place], [place], [address] [address], [place], [place], [place], [address] Phone: [phone]  Fax: [phone]   Subjective:  Patient resting in bed comfortably.  Nurse reports bowel movements today were brown in color.  Yesterday she had bowel movements with red blood.  Objective: Exam: Vital signs in last 24 hours: Vitals:   05/24/17 2305 05/24/17 2319 05/25/17 0549 05/25/17 1148  BP: (!) 143/45 (!) 177/66 (!) 154/45 (!) 165/53  Pulse: (!) 101 (!) 102 89 80  Resp: (!) 22 (!) 21 18   Temp:  98.5 F (36.9 C) 98.6 F (37 C) 98.4 F (36.9 C)  TempSrc:  Oral Oral   SpO2: 100% 100% 100% 99%  Weight:  103 lb 2.8 oz (46.8 kg)    Height:       Weight change:   Intake/Output Summary (Last 24 hours) at 05/25/2017 1256 Last data filed at 05/24/2017 2319 Gross per 24 hour  Intake -  Output 5 ml  Net -5 ml    General: No acute distress, AAO x3 Abd: Soft, NT/ND, No HSM Skin: Warm, no rashes Neck: Supple, Trachea midline   Lab Results: Lab Results  Component Value Date   WBC 10.1 05/25/2017   HGB 9.7 (L) 05/25/2017   HCT 28.9 (L) 05/25/2017   MCV 95.4 05/25/2017   PLT 209 05/25/2017   Micro Results: Recent Results (from the past 240 hour(s))  Blood culture (routine x 2)     Status: None   Collection Time: 05/17/17 11:16 AM  Result Value Ref Range Status   Specimen Description BLOOD BLOOD RIGHT HAND  Final   Special Requests   Final    BOTTLES DRAWN AEROBIC AND ANAEROBIC Blood Culture adequate volume   Culture   Final    NO GROWTH 5 DAYS Performed at [facility], [address] [place], [place] [address]    Report Status 05/22/2017 FINAL  Final  Blood culture (routine x 2)     Status: None   Collection Time: 05/17/17 11:16 AM  Result Value Ref Range Status   Specimen Description BLOOD FOREARM  Final   Special Requests   Final    BOTTLES DRAWN AEROBIC AND ANAEROBIC Blood Culture adequate volume    Culture   Final    NO GROWTH 5 DAYS Performed at [facility], [address] [place], [place] [address]    Report Status 05/22/2017 FINAL  Final  C difficile quick scan w PCR reflex     Status: Abnormal   Collection Time: 05/17/17  6:25 PM  Result Value Ref Range Status   C Diff antigen POSITIVE (A) NEGATIVE Final   C Diff toxin POSITIVE (A) NEGATIVE Final   C Diff interpretation Toxin producing C. difficile detected.  Final    Comment: RESULT CALLED TO, READ BACK BY AND VERIFIED WITH: KIRSTEN JOYNER @1954  05/17/17 AKT Performed at [facility], [address] [place], [place] [address]    Studies/Results: No results found. Medications:  Scheduled Meds: . amLODipine  5 mg Oral Daily  . calcium carbonate  1 tablet Oral TID WC  . epoetin (EPOGEN/PROCRIT) injection  10,000 Units Intravenous Q M,W,F-HD  . feeding supplement (ENSURE ENLIVE)  237 mL Oral TID BM  . folic acid  0.5 mg Oral Daily  . Ganciclovir  1 drop Right Eye QID  . insulin aspart  0-9 Units Subcutaneous TID WC  . levothyroxine  75 mcg Oral BH-q7a  .  metoprolol tartrate  50 mg Oral BID  . metroNIDAZOLE  500 mg Oral Q8H  . multivitamin  1 tablet Oral QHS  . pantoprazole  40 mg Oral BID AC  . polyethylene glycol-electrolytes  4,000 mL Oral Once  . pravastatin  40 mg Oral QPM  . vancomycin  500 mg Oral Q6H  . ascorbic acid  250 mg Oral BID   Continuous Infusions: . sodium chloride     PRN Meds:.acetaminophen, albuterol, HYDROcodone-acetaminophen, ondansetron **OR** ondansetron (ZOFRAN) IV, polyethylene glycol, polyvinyl alcohol, senna-docusate   Assessment: Active Problems:   GIB (gastrointestinal bleeding)   Pressure injury of skin    Plan: Hemoglobin stable today at 9.7 this morning Required packed red blood cell transfusion yesterday due to hemoglobin of 6.8 Patient is hemodynamically stable As per nurse report, bowel movements today were brown in color Rectal bleeding from  stercoral ulcer might just be resolving Biopsy report is still pending, and patient may be oozing from the biopsy site itself versus to start controller start soft Maintain soft stool with MiraLAX once or twice daily Change this regimen, and add other medications like Dulcolax or others as needed to avoid constipation if needed Continue serial CBCs and transfuse PRN  No visible vessels were reported  per Dr. Ricky Stabs colonoscopy reportat the site of the stercoral ulcer  Therefore, therapeutic intervention of that area will be limited  However, if she continues to have hematochezia or anemia, Flexible sigmoidoscopy to reevaluate the area can be considered in the future   LOS: 8 days   Vonda Antigua, MD 05/25/2017, 12:56 PM

## 2017-05-26 DIAGNOSIS — I10 Essential (primary) hypertension: Secondary | ICD-10-CM | POA: Diagnosis not present

## 2017-05-26 DIAGNOSIS — I509 Heart failure, unspecified: Secondary | ICD-10-CM | POA: Diagnosis not present

## 2017-05-26 DIAGNOSIS — K5713 Diverticulitis of small intestine without perforation or abscess with bleeding: Secondary | ICD-10-CM | POA: Diagnosis not present

## 2017-05-26 DIAGNOSIS — S329XXD Fracture of unspecified parts of lumbosacral spine and pelvis, subsequent encounter for fracture with routine healing: Secondary | ICD-10-CM | POA: Diagnosis not present

## 2017-05-26 DIAGNOSIS — E039 Hypothyroidism, unspecified: Secondary | ICD-10-CM | POA: Diagnosis not present

## 2017-05-26 DIAGNOSIS — K5909 Other constipation: Secondary | ICD-10-CM | POA: Diagnosis not present

## 2017-05-26 DIAGNOSIS — E569 Vitamin deficiency, unspecified: Secondary | ICD-10-CM | POA: Diagnosis not present

## 2017-05-26 DIAGNOSIS — N186 End stage renal disease: Secondary | ICD-10-CM | POA: Diagnosis not present

## 2017-05-26 DIAGNOSIS — R1312 Dysphagia, oropharyngeal phase: Secondary | ICD-10-CM | POA: Diagnosis not present

## 2017-05-26 DIAGNOSIS — K219 Gastro-esophageal reflux disease without esophagitis: Secondary | ICD-10-CM | POA: Diagnosis not present

## 2017-05-26 DIAGNOSIS — E119 Type 2 diabetes mellitus without complications: Secondary | ICD-10-CM | POA: Diagnosis not present

## 2017-05-26 DIAGNOSIS — D631 Anemia in chronic kidney disease: Secondary | ICD-10-CM | POA: Diagnosis not present

## 2017-05-26 DIAGNOSIS — Z7401 Bed confinement status: Secondary | ICD-10-CM | POA: Diagnosis not present

## 2017-05-26 DIAGNOSIS — K626 Ulcer of anus and rectum: Secondary | ICD-10-CM | POA: Diagnosis not present

## 2017-05-26 DIAGNOSIS — M6281 Muscle weakness (generalized): Secondary | ICD-10-CM | POA: Diagnosis not present

## 2017-05-26 DIAGNOSIS — I2510 Atherosclerotic heart disease of native coronary artery without angina pectoris: Secondary | ICD-10-CM | POA: Diagnosis not present

## 2017-05-26 DIAGNOSIS — I120 Hypertensive chronic kidney disease with stage 5 chronic kidney disease or end stage renal disease: Secondary | ICD-10-CM | POA: Diagnosis not present

## 2017-05-26 DIAGNOSIS — E785 Hyperlipidemia, unspecified: Secondary | ICD-10-CM | POA: Diagnosis not present

## 2017-05-26 DIAGNOSIS — N2581 Secondary hyperparathyroidism of renal origin: Secondary | ICD-10-CM | POA: Diagnosis not present

## 2017-05-26 DIAGNOSIS — Z992 Dependence on renal dialysis: Secondary | ICD-10-CM | POA: Diagnosis not present

## 2017-05-26 DIAGNOSIS — K922 Gastrointestinal hemorrhage, unspecified: Secondary | ICD-10-CM | POA: Diagnosis not present

## 2017-05-26 DIAGNOSIS — A0472 Enterocolitis due to Clostridium difficile, not specified as recurrent: Secondary | ICD-10-CM | POA: Diagnosis not present

## 2017-05-26 DIAGNOSIS — L89899 Pressure ulcer of other site, unspecified stage: Secondary | ICD-10-CM | POA: Diagnosis not present

## 2017-05-26 DIAGNOSIS — Z8719 Personal history of other diseases of the digestive system: Secondary | ICD-10-CM | POA: Diagnosis not present

## 2017-05-26 DIAGNOSIS — D62 Acute posthemorrhagic anemia: Secondary | ICD-10-CM | POA: Diagnosis not present

## 2017-05-26 DIAGNOSIS — K921 Melena: Secondary | ICD-10-CM | POA: Diagnosis not present

## 2017-05-26 DIAGNOSIS — D649 Anemia, unspecified: Secondary | ICD-10-CM | POA: Diagnosis not present

## 2017-05-26 LAB — CBC
HCT: 29.5 % — ABNORMAL LOW (ref 35.0–47.0)
HEMOGLOBIN: 9.9 g/dL — AB (ref 12.0–16.0)
MCH: 32.3 pg (ref 26.0–34.0)
MCHC: 33.7 g/dL (ref 32.0–36.0)
MCV: 95.7 fL (ref 80.0–100.0)
Platelets: 228 10*3/uL (ref 150–440)
RBC: 3.08 MIL/uL — ABNORMAL LOW (ref 3.80–5.20)
RDW: 19.9 % — ABNORMAL HIGH (ref 11.5–14.5)
WBC: 8.7 10*3/uL (ref 3.6–11.0)

## 2017-05-26 LAB — GLUCOSE, CAPILLARY
Glucose-Capillary: 113 mg/dL — ABNORMAL HIGH (ref 65–99)
Glucose-Capillary: 211 mg/dL — ABNORMAL HIGH (ref 65–99)

## 2017-05-26 LAB — SURGICAL PATHOLOGY

## 2017-05-26 MED ORDER — VANCOMYCIN 50 MG/ML ORAL SOLUTION: 500.0000 mg | Freq: Four times a day (QID) | ORAL | 0 refills | Status: AC

## 2017-05-26 MED ORDER — AMLODIPINE BESYLATE 10 MG PO TABS: 10.0000 mg | ORAL_TABLET | Freq: Every day | ORAL | Status: AC

## 2017-05-26 MED ORDER — METRONIDAZOLE 500 MG PO TABS: 500.0000 mg | ORAL_TABLET | Freq: Three times a day (TID) | ORAL | 0 refills | Status: AC

## 2017-05-26 NOTE — Progress Notes (Signed)
Pre HD assessment    05/26/17 1005  Neurological  Level of Consciousness Alert  Orientation Level Oriented to person;Oriented to place;Oriented to situation  Respiratory  Respiratory Pattern Regular;Unlabored  Chest Assessment Chest expansion symmetrical  Cardiac  ECG Monitor Yes  Vascular  R Radial Pulse +2  L Radial Pulse +2  Integumentary  Integumentary (WDL) X  Skin Color Appropriate for ethnicity  Musculoskeletal  Musculoskeletal (WDL) X  Generalized Weakness Yes  GU Assessment  Genitourinary (WDL) X  Genitourinary Symptoms  (HD)  Psychosocial  Psychosocial (WDL) WDL

## 2017-05-26 NOTE — Care Management (Signed)
Amanda Morris dialysis liaison notified of discharge  

## 2017-05-26 NOTE — Progress Notes (Signed)
Post HD assessment. Pt tolerated tx well without c/o or complications. Net UF 311, goal met.    05/26/17 1339  Vital Signs  Temp 98.4 F (36.9 C)  Temp Source Oral  Pulse Rate 94  Pulse Rate Source Monitor  Resp (!) 22  BP (!) 153/52  BP Location Right Arm  BP Method Automatic  Patient Position (if appropriate) Lying  Oxygen Therapy  SpO2 94 %  O2 Device Nasal Cannula  O2 Flow Rate (L/min) 1 L/min  Dialysis Weight  Weight 45 kg (99 lb 3.3 oz)  Type of Weight Post-Dialysis  Post-Hemodialysis Assessment  Rinseback Volume (mL) 250 mL  KECN 50.7 V  Dialyzer Clearance Lightly streaked  Duration of HD Treatment -hour(s) 3 hour(s)  Hemodialysis Intake (mL) 500 mL  UF Total -Machine (mL) 811 mL  Net UF (mL) 311 mL  Tolerated HD Treatment Yes  AVG/AVF Arterial Site Held (minutes) 10 minutes  AVG/AVF Venous Site Held (minutes) 10 minutes  Education / Care Plan  Dialysis Education Provided Yes  Documented Education in Care Plan Yes  Fistula / Graft Left Upper arm Arteriovenous fistula  No Placement Date or Time found.   Orientation: Left  Access Location: Upper arm  Access Type: Arteriovenous fistula  Site Condition No complications  Fistula / Graft Assessment Present;Thrill;Bruit  Status Deaccessed  Drainage Description None

## 2017-05-26 NOTE — Progress Notes (Signed)
[facility]  ROUNDING NOTE   Subjective:   Patient is doing fair Denies acute c/o Appetite is low No SOB Seen at the start of HD    HEMODIALYSIS FLOWSHEET:  Blood Flow Rate (mL/min): 300 mL/min Arterial Pressure (mmHg): -190 mmHg Venous Pressure (mmHg): 240 mmHg Transmembrane Pressure (mmHg): 70 mmHg Ultrafiltration Rate (mL/min): 260 mL/min Dialysate Flow Rate (mL/min): 600 ml/min Conductivity: Machine : 14.1 Conductivity: Machine : 14.1 Dialysis Fluid Bolus: Normal Saline Bolus Amount (mL): 250 mL Dialysate Change: 2K     Objective:  Vital signs in last 24 hours:  Temp:  [98 F (36.7 C)-98.6 F (37 C)] 98 F (36.7 C) (05/22 1000) Pulse Rate:  [80-92] 92 (05/22 1315) Resp:  [18-24] 20 (05/22 1315) BP: (135-180)/(41-64) 139/50 (05/22 1315) SpO2:  [93 %-100 %] 98 % (05/22 1315) Weight:  [45.1 kg (99 lb 6.8 oz)-45.9 kg (101 lb 3.2 oz)] 45.1 kg (99 lb 6.8 oz) (05/22 1000)  Weight change: 0.804 kg (1 lb 12.4 oz) Filed Weights   05/24/17 2319 05/26/17 0513 05/26/17 1000  Weight: 46.8 kg (103 lb 2.8 oz) 45.9 kg (101 lb 3.2 oz) 45.1 kg (99 lb 6.8 oz)    Intake/Output: I/O last 3 completed shifts: In: 0  Out: 5 [Other:5]   Intake/Output this shift:  No intake/output data recorded.  Physical Exam: General: NAD, laying in bed  Head: Normocephalic, atraumatic. Moist oral mucosal membranes  Eyes: Anicteric,    Neck: Supple, trachea midline  Lungs:  Clear to auscultation  Heart:  no rub  Abdomen:  Soft, nontender,   Extremities: no peripheral edema.  Neurologic: Alert and oriented; decreased hearing  Skin: No lesions  Access: Left AVF    Basic Metabolic Panel: Recent Labs  Lab 05/20/17 0910 05/21/17 0833 05/25/17 0051  NA 137 139 136  K 3.7 3.9 4.0  CL 102 101 99*  CO2 29 29 30   GLUCOSE 158* 103* 179*  BUN 12 16 8   CREATININE 2.31* 3.47* 1.79*  CALCIUM 7.8* 8.2* 7.8*  PHOS 2.7 3.0 1.5*    Liver Function Tests: Recent Labs  Lab  05/20/17 0910 05/21/17 0833 05/25/17 0051  ALBUMIN 2.3* 2.4* 2.5*   No results for input(s): LIPASE, AMYLASE in the last 168 hours. No results for input(s): AMMONIA in the last 168 hours.  CBC: Recent Labs  Lab 05/23/17 0655 05/24/17 0931  05/25/17 0051 05/25/17 0443 05/25/17 1410 05/25/17 1654 05/25/17 2217 05/26/17 0426  WBC 8.4 8.5  --  9.4 10.1  --   --   --  8.7  HGB 8.5* 7.3*   < > 10.6* 9.7* 9.9* 9.5* 9.9* 9.9*  HCT 25.2* 21.9*   < > 31.4* 28.9* 29.9* 28.8* 29.7* 29.5*  MCV 96.5 96.8  --  94.8 95.4  --   --   --  95.7  PLT 238 245  --  218 209  --   --   --  228   < > = values in this interval not displayed.    Cardiac Enzymes: No results for input(s): CKTOTAL, CKMB, CKMBINDEX, TROPONINI in the last 168 hours.  BNP: Invalid input(s): POCBNP  CBG: Recent Labs  Lab 05/25/17 0731 05/25/17 1147 05/25/17 1636 05/25/17 2120 05/26/17 0731  GLUCAP 129* 155* 141* 187* 113*    Microbiology: Results for orders placed or performed during the hospital encounter of 05/17/17  Blood culture (routine x 2)     Status: None   Collection Time: 05/17/17 11:16 AM  Result Value  Ref Range Status   Specimen Description BLOOD BLOOD RIGHT HAND  Final   Special Requests   Final    BOTTLES DRAWN AEROBIC AND ANAEROBIC Blood Culture adequate volume   Culture   Final    NO GROWTH 5 DAYS Performed at [facility], [address] [place], [place] [address]    Report Status 05/22/2017 FINAL  Final  Blood culture (routine x 2)     Status: None   Collection Time: 05/17/17 11:16 AM  Result Value Ref Range Status   Specimen Description BLOOD FOREARM  Final   Special Requests   Final    BOTTLES DRAWN AEROBIC AND ANAEROBIC Blood Culture adequate volume   Culture   Final    NO GROWTH 5 DAYS Performed at [facility], [address], [place], [place] [address]    Report Status 05/22/2017 FINAL  Final  C difficile quick scan w PCR reflex     Status: Abnormal    Collection Time: 05/17/17  6:25 PM  Result Value Ref Range Status   C Diff antigen POSITIVE (A) NEGATIVE Final   C Diff toxin POSITIVE (A) NEGATIVE Final   C Diff interpretation Toxin producing C. difficile detected.  Final    Comment: RESULT CALLED TO, READ BACK BY AND VERIFIED WITH: KIRSTEN JOYNER @1952  05/17/17 AKT Performed at [facility], [address] [place], [place] [address]     Coagulation Studies: No results for input(s): LABPROT, INR in the last 72 hours.  Urinalysis: No results for input(s): COLORURINE, LABSPEC, PHURINE, GLUCOSEU, HGBUR, BILIRUBINUR, KETONESUR, PROTEINUR, UROBILINOGEN, NITRITE, LEUKOCYTESUR in the last 72 hours.  Invalid input(s): APPERANCEUR    Imaging: No results found.   Medications:   . sodium chloride     . amLODipine  10 mg Oral Daily  . calcium carbonate  1 tablet Oral TID WC  . epoetin (EPOGEN/PROCRIT) injection  10,000 Units Intravenous Q M,W,F-HD  . feeding supplement (ENSURE ENLIVE)  237 mL Oral TID BM  . folic acid  0.5 mg Oral Daily  . Ganciclovir  1 drop Right Eye QID  . insulin aspart  0-9 Units Subcutaneous TID WC  . levothyroxine  75 mcg Oral BH-q7a  . metoprolol tartrate  50 mg Oral BID  . metroNIDAZOLE  500 mg Oral Q8H  . multivitamin  1 tablet Oral QHS  . pantoprazole  40 mg Oral BID AC  . polyethylene glycol-electrolytes  4,000 mL Oral Once  . pravastatin  40 mg Oral QPM  . vancomycin  500 mg Oral Q6H  . ascorbic acid  250 mg Oral BID   acetaminophen, albuterol, HYDROcodone-acetaminophen, ondansetron **OR** ondansetron (ZOFRAN) IV, polyethylene glycol, polyvinyl alcohol, senna-docusate  Assessment/ Plan:  [name] is a 84 y.o. white female with end stage renal disease on hemodialysis, coronary artery disease, history of bilateral knee replacement, aortic valve replacement 2009, diabetes, hypertension, hypothyroidism, peripheral vascular disease, pulmonary hypertension, anxiety  [facility]/MWF  1. End Stage Renal Disease:   -  MWF schedule.   2.  Anemia of chronic kidney disease: with GI bleed from rectal ulcer - Status post PRBC transfusions on 5/13, 5/15, 5/20 - mircera as outpatient - EPO with HD treatments - Appreciate GI input  3. Secondary Hyperparathyroidism:  - tums with meals.   4. C. Diff colitis: - PO vanco and metronidazole.     LOS: [place] 5/22/20191:18 PM

## 2017-05-26 NOTE — Progress Notes (Signed)
Pre HD assessment    05/26/17 1000  Vital Signs  Temp 98 F (36.7 C)  Temp Source Oral  Pulse Rate 89  Pulse Rate Source Monitor  Resp 18  BP (!) 169/58  BP Location Right Arm  BP Method Automatic  Patient Position (if appropriate) Lying  Oxygen Therapy  SpO2 93 %  O2 Device Nasal Cannula  O2 Flow Rate (L/min) 1 L/min  Pain Assessment  Pain Scale 0-10  Pain Score 0  Dialysis Weight  Weight 45.1 kg (99 lb 6.8 oz)  Type of Weight Pre-Dialysis  Time-Out for Hemodialysis  What Procedure? HD  Pt Identifiers(min of two) First/Last Name;MRN/Account#  Correct Site? Yes  Correct Side? Yes  Correct Procedure? Yes  Consents Verified? Yes  Rad Studies Available? [name]  Safety Precautions Reviewed? Yes  Engineer, civil (consulting) Number  (7A)  Station Number  (bedside 218, enteric precautions )  UF/Alarm Test Passed  Conductivity: Meter 13.8  Conductivity: Machine  14.2  pH 7.4  Reverse Osmosis [id number]  Normal Saline Lot Number [id number]  Dialyzer Lot Number [id number]  Disposable Set Lot Number [id number]  Machine Temperature 98.6 F (37 C)  Musician and Audible Yes  Blood Lines Intact and Secured Yes  Pre Treatment Patient Checks  Vascular access used during treatment Fistula  Hepatitis B Surface Antigen Results Negative  Date Hepatitis B Surface Antigen Drawn 01/03/17  Hepatitis B Surface Antibody  (>10)  Date Hepatitis B Surface Antibody Drawn 01/03/17  Hemodialysis Consent Verified Yes  Hemodialysis Standing Orders Initiated Yes  ECG (Telemetry) Monitor On Yes  Prime Ordered Normal Saline  Length of  DialysisTreatment -hour(s) 3 Hour(s)  Dialyzer Elisio 17H NR  Dialysate 3K, 2.5 Ca  Dialysis Anticoagulant None  Dialysate Flow Ordered 600  Blood Flow Rate Ordered 300 mL/min  Ultrafiltration Goal 0 Liters  Dialysis Blood Pressure Support Ordered Normal Saline  Education / Care Plan  Dialysis Education Provided Yes  Documented Education in Care Plan Yes   Fistula / Graft Left Upper arm Arteriovenous fistula  No Placement Date or Time found.   Orientation: Left  Access Location: Upper arm  Access Type: Arteriovenous fistula  Site Condition No complications  Fistula / Graft Assessment Present;Thrill;Bruit  Drainage Description None

## 2017-05-26 NOTE — Progress Notes (Signed)
[name] Antigua, MD [address], [place], [place], [place], [address] [address], [place], [place], [place], [address] Phone: [phone]  Fax: [phone]   Subjective:  Hemoglobin stable around 9 with no further episodes of BRBPR. Patient denies any abdominal pain or any complaints  Objective: Exam: Vital signs in last 24 hours: Vitals:   05/25/17 2045 05/26/17 0513 05/26/17 1000 05/26/17 1016  BP: (!) 158/62 (!) 158/54 (!) 169/58 135/64  Pulse: 91 80 89 86  Resp: 20 (!) 24 18 20   Temp: 98.6 F (37 C) 98 F (36.7 C) 98 F (36.7 C)   TempSrc: Oral Oral Oral   SpO2: 94% 94% 93% 93%  Weight:  101 lb 3.2 oz (45.9 kg) 99 lb 6.8 oz (45.1 kg)   Height:       Weight change: 1 lb 12.4 oz (0.804 kg)  Intake/Output Summary (Last 24 hours) at 05/26/2017 1029 Last data filed at 05/26/2017 0700 Gross per 24 hour  Intake 0 ml  Output 0 ml  Net 0 ml    General: No acute distress, AAO x3 Abd: Soft, NT/ND, No HSM Skin: Warm, no rashes Neck: Supple, Trachea midline   Lab Results: Lab Results  Component Value Date   WBC 8.7 05/26/2017   HGB 9.9 (L) 05/26/2017   HCT 29.5 (L) 05/26/2017   MCV 95.7 05/26/2017   PLT 228 05/26/2017   Micro Results: Recent Results (from the past 240 hour(s))  Blood culture (routine x 2)     Status: None   Collection Time: 05/17/17 11:16 AM  Result Value Ref Range Status   Specimen Description BLOOD BLOOD RIGHT HAND  Final   Special Requests   Final    BOTTLES DRAWN AEROBIC AND ANAEROBIC Blood Culture adequate volume   Culture   Final    NO GROWTH 5 DAYS Performed at [facility], [address] [place], [place] [address]    Report Status 05/22/2017 FINAL  Final  Blood culture (routine x 2)     Status: None   Collection Time: 05/17/17 11:16 AM  Result Value Ref Range Status   Specimen Description BLOOD FOREARM  Final   Special Requests   Final    BOTTLES DRAWN AEROBIC AND ANAEROBIC Blood Culture adequate volume   Culture   Final    NO GROWTH 5 DAYS Performed at [facility], [address] [place], [place] [address]    Report Status 05/22/2017 FINAL  Final  C difficile quick scan w PCR reflex     Status: Abnormal   Collection Time: 05/17/17  6:25 PM  Result Value Ref Range Status   C Diff antigen POSITIVE (A) NEGATIVE Final   C Diff toxin POSITIVE (A) NEGATIVE Final   C Diff interpretation Toxin producing C. difficile detected.  Final    Comment: RESULT CALLED TO, READ BACK BY AND VERIFIED WITH: KIRSTEN JOYNER @1952  05/17/17 AKT Performed at [facility], [address] [place], [place] [address]    Studies/Results: No results found. Medications:  Scheduled Meds: . amLODipine  10 mg Oral Daily  . calcium carbonate  1 tablet Oral TID WC  . epoetin (EPOGEN/PROCRIT) injection  10,000 Units Intravenous Q M,W,F-HD  . feeding supplement (ENSURE ENLIVE)  237 mL Oral TID BM  . folic acid  0.5 mg Oral Daily  . Ganciclovir  1 drop Right Eye QID  . insulin aspart  0-9 Units Subcutaneous TID WC  . levothyroxine  75 mcg Oral BH-q7a  .  metoprolol tartrate  50 mg Oral BID  . metroNIDAZOLE  500 mg Oral Q8H  . multivitamin  1 tablet Oral QHS  . pantoprazole  40 mg Oral BID AC  . polyethylene glycol-electrolytes  4,000 mL Oral Once  . pravastatin  40 mg Oral QPM  . vancomycin  500 mg Oral Q6H  . ascorbic acid  250 mg Oral BID   Continuous Infusions: . sodium chloride     PRN Meds:.acetaminophen, albuterol, HYDROcodone-acetaminophen, ondansetron **OR** ondansetron (ZOFRAN) IV, polyethylene glycol, polyvinyl alcohol, senna-docusate   Assessment: Active Problems:   GIB (gastrointestinal bleeding)   Pressure injury of skin    Plan: GI bleed from stercoral ulcers seems to be resolving at this time Continue high-fiber diet, and maintain soft stool  Pathology report reviewed and is follows:    DIAGNOSIS:  A. RECTAL ULCER; COLD BIOPSY:  - MARKEDLY INFLAMED GRANULATION  TISSUE CONSISTENT WITH ULCER.  - SEPARATE FRAGMENT OF TUBULAR ADENOMA.  - NEGATIVE FOR VIRAL CYTOPATHIC EFFECT, PSEUDOMEMBRANES, HIGH-GRADE  DYSPLASIA AND MALIGNANCY.   Comment:  A CMV stain is negative.      The fragment of tubular adenoma noted on pathology report, needs to be reviewed with the pathologist I will contact the pathology center tomorrow to discuss this. This might mean, the patient has an underlying polyp beneath the ulcer.  Continue serial CBCs and transfuse. Avoid constipation    LOS: 9 days   [name] Antigua, MD 05/26/2017, 10:29 AM

## 2017-05-26 NOTE — Progress Notes (Signed)
Post HD assessment    05/26/17 1338  Neurological  Level of Consciousness Alert  Orientation Level Oriented to person;Oriented to place;Oriented to situation  Respiratory  Respiratory Pattern Regular;Unlabored  Chest Assessment Chest expansion symmetrical  Cardiac  ECG Monitor Yes  Vascular  R Radial Pulse +2  L Radial Pulse +2  Integumentary  Integumentary (WDL) X  Skin Color Appropriate for ethnicity  Musculoskeletal  Musculoskeletal (WDL) X  Generalized Weakness Yes  GU Assessment  Genitourinary (WDL) X  Genitourinary Symptoms  (HD)  Psychosocial  Psychosocial (WDL) WDL

## 2017-05-26 NOTE — Progress Notes (Signed)
HD tx end    05/26/17 1323  Vital Signs  Pulse Rate 93  Pulse Rate Source Monitor  Resp 18  BP (!) 159/54  BP Location Right Arm  BP Method Automatic  Patient Position (if appropriate) Lying  Oxygen Therapy  SpO2 97 %  O2 Device Nasal Cannula  O2 Flow Rate (L/min) 1 L/min  During Hemodialysis Assessment  Dialysis Fluid Bolus Normal Saline  Bolus Amount (mL) 250 mL  Intra-Hemodialysis Comments Tx completed

## 2017-05-26 NOTE — Clinical Social Work Note (Signed)
Patient is medically stable for discharge back to [facility] today. CSW notified patient and patient's son Delisa Finck [phone]. Patient and son agree to discharge today. CSW notified [facility] liaison of discharge. Patient will be transported by EMS. RN to call report.   Annamaria Boots MSW, [place] [phone]

## 2017-05-26 NOTE — Progress Notes (Signed)
HD tx start    05/26/17 1016  Vital Signs  Pulse Rate 86  Pulse Rate Source Monitor  Resp 20  BP 135/64  BP Location Right Arm  BP Method Automatic  Patient Position (if appropriate) Lying  Oxygen Therapy  SpO2 93 %  O2 Device Nasal Cannula  O2 Flow Rate (L/min) 1 L/min  During Hemodialysis Assessment  Blood Flow Rate (mL/min) 300 mL/min  Arterial Pressure (mmHg) -110 mmHg  Venous Pressure (mmHg) 130 mmHg  Transmembrane Pressure (mmHg) 60 mmHg  Ultrafiltration Rate (mL/min) 330 mL/min  Dialysate Flow Rate (mL/min) 600 ml/min  Conductivity: Machine  14.1  HD Safety Checks Performed Yes  Dialysis Fluid Bolus Normal Saline  Bolus Amount (mL) 250 mL  Intra-Hemodialysis Comments Tx initiated  Fistula / Graft Left Upper arm Arteriovenous fistula  No Placement Date or Time found.   Orientation: Left  Access Location: Upper arm  Access Type: Arteriovenous fistula  Status Accessed  Needle Size 15

## 2017-05-27 ENCOUNTER — Encounter: Payer: Self-pay | Admitting: Gastroenterology

## 2017-05-27 DIAGNOSIS — N186 End stage renal disease: Secondary | ICD-10-CM | POA: Diagnosis not present

## 2017-05-27 DIAGNOSIS — K922 Gastrointestinal hemorrhage, unspecified: Secondary | ICD-10-CM | POA: Diagnosis not present

## 2017-05-27 DIAGNOSIS — E119 Type 2 diabetes mellitus without complications: Secondary | ICD-10-CM | POA: Diagnosis not present

## 2017-05-27 DIAGNOSIS — S329XXD Fracture of unspecified parts of lumbosacral spine and pelvis, subsequent encounter for fracture with routine healing: Secondary | ICD-10-CM | POA: Diagnosis not present

## 2017-05-27 DIAGNOSIS — I10 Essential (primary) hypertension: Secondary | ICD-10-CM | POA: Diagnosis not present

## 2017-05-27 NOTE — Progress Notes (Signed)
Pt. Was discharged home by primary team yesterday.  Discussed patient with Dr. Alice Reichert who did her colonoscopy and biopsies. He will see patient in his clinic to discus rectal ulcer biopsies with finding of tubular adenoma.   See Colonoscopy report and pathology report in patient's chart.

## 2017-05-28 DIAGNOSIS — N186 End stage renal disease: Secondary | ICD-10-CM | POA: Diagnosis not present

## 2017-05-28 DIAGNOSIS — D631 Anemia in chronic kidney disease: Secondary | ICD-10-CM | POA: Diagnosis not present

## 2017-05-28 DIAGNOSIS — N2581 Secondary hyperparathyroidism of renal origin: Secondary | ICD-10-CM | POA: Diagnosis not present

## 2017-05-28 LAB — BLOOD GAS, ARTERIAL
Acid-Base Excess: 8.1 mmol/L — ABNORMAL HIGH (ref 0.0–2.0)
Bicarbonate: 32.8 mmol/L — ABNORMAL HIGH (ref 20.0–28.0)
FIO2: 0.36
O2 SAT: 98 %
PATIENT TEMPERATURE: 37
PCO2 ART: 45 mmHg (ref 32.0–48.0)
pH, Arterial: 7.47 — ABNORMAL HIGH (ref 7.350–7.450)
pO2, Arterial: 98 mmHg (ref 83.0–108.0)

## 2017-05-31 DIAGNOSIS — N186 End stage renal disease: Secondary | ICD-10-CM | POA: Diagnosis not present

## 2017-05-31 DIAGNOSIS — D631 Anemia in chronic kidney disease: Secondary | ICD-10-CM | POA: Diagnosis not present

## 2017-05-31 DIAGNOSIS — N2581 Secondary hyperparathyroidism of renal origin: Secondary | ICD-10-CM | POA: Diagnosis not present

## 2017-06-02 ENCOUNTER — Other Ambulatory Visit: Payer: Self-pay | Admitting: *Deleted

## 2017-06-02 DIAGNOSIS — N186 End stage renal disease: Secondary | ICD-10-CM | POA: Diagnosis not present

## 2017-06-02 DIAGNOSIS — D631 Anemia in chronic kidney disease: Secondary | ICD-10-CM | POA: Diagnosis not present

## 2017-06-02 DIAGNOSIS — N2581 Secondary hyperparathyroidism of renal origin: Secondary | ICD-10-CM | POA: Diagnosis not present

## 2017-06-02 NOTE — Patient Outreach (Signed)
[place] [facility]) Care Management  06/02/2017  [name] 1920/06/03 [id number]   Phone call to discharge planner at [facility]-[name] to discuss patient's progress in rehab. Per Janett Billow, she has spoke to patient's son who has agreed to a palliative care consult for patient. Patient is not a candidate for Hospice as she continues to receive dialysis. Per Janett Billow, patient is often confused and needs increased assistance. Patient's son understands that patient may need more care, however is not able to commit to a plan of care at this time. In home personal care discussed with patient's son as well as ALF. Per Janett Billow, she will have [facility] speak to patient's son regarding patient's potential care needs as well as therapy. Per Janett Billow, if patient and son do decide on ALF, she would be an out of pocket expense for patient.    Plan: This social worker will plan to visit patient on 06/04/17.   [name] [facility] Care Management [phone]

## 2017-06-03 DIAGNOSIS — K922 Gastrointestinal hemorrhage, unspecified: Secondary | ICD-10-CM | POA: Diagnosis not present

## 2017-06-03 DIAGNOSIS — N186 End stage renal disease: Secondary | ICD-10-CM | POA: Diagnosis not present

## 2017-06-03 DIAGNOSIS — I10 Essential (primary) hypertension: Secondary | ICD-10-CM | POA: Diagnosis not present

## 2017-06-03 DIAGNOSIS — S329XXD Fracture of unspecified parts of lumbosacral spine and pelvis, subsequent encounter for fracture with routine healing: Secondary | ICD-10-CM | POA: Diagnosis not present

## 2017-06-03 DIAGNOSIS — A0472 Enterocolitis due to Clostridium difficile, not specified as recurrent: Secondary | ICD-10-CM | POA: Diagnosis not present

## 2017-06-04 ENCOUNTER — Other Ambulatory Visit: Payer: Self-pay | Admitting: *Deleted

## 2017-06-04 DIAGNOSIS — I120 Hypertensive chronic kidney disease with stage 5 chronic kidney disease or end stage renal disease: Secondary | ICD-10-CM | POA: Diagnosis not present

## 2017-06-04 DIAGNOSIS — Z992 Dependence on renal dialysis: Secondary | ICD-10-CM | POA: Diagnosis not present

## 2017-06-04 DIAGNOSIS — N186 End stage renal disease: Secondary | ICD-10-CM | POA: Diagnosis not present

## 2017-06-04 NOTE — Patient Outreach (Signed)
[place] [facility]) Care Management  06/04/2017  [name] [birth year] [id number]   Post Acute Consult-met briefly with patient at [facility], however patient would not engage with this social worker and appeared disorganized in her thoughts. Patient was being prepared to go to Dialysis during visit.  This Education officer, museum spoke with patient's son who states that he is considering hiring an in home aid for patient on the days that she does not have dialysis or ALF. He is not sure of the definite plan but does understand the importance of patient having supervision in the home  she lives alone.  Plan: This Education officer, museum will follow up with patient at the rehab within 2 weeks.   [name] [facility] Care Management [phone]

## 2017-06-05 DIAGNOSIS — N186 End stage renal disease: Secondary | ICD-10-CM | POA: Diagnosis not present

## 2017-06-05 DIAGNOSIS — N2581 Secondary hyperparathyroidism of renal origin: Secondary | ICD-10-CM | POA: Diagnosis not present

## 2017-06-05 DIAGNOSIS — E119 Type 2 diabetes mellitus without complications: Secondary | ICD-10-CM | POA: Diagnosis not present

## 2017-06-05 DIAGNOSIS — D631 Anemia in chronic kidney disease: Secondary | ICD-10-CM | POA: Diagnosis not present

## 2017-06-07 DIAGNOSIS — E119 Type 2 diabetes mellitus without complications: Secondary | ICD-10-CM | POA: Diagnosis not present

## 2017-06-07 DIAGNOSIS — N186 End stage renal disease: Secondary | ICD-10-CM | POA: Diagnosis not present

## 2017-06-07 DIAGNOSIS — D631 Anemia in chronic kidney disease: Secondary | ICD-10-CM | POA: Diagnosis not present

## 2017-06-07 DIAGNOSIS — N2581 Secondary hyperparathyroidism of renal origin: Secondary | ICD-10-CM | POA: Diagnosis not present

## 2017-06-08 ENCOUNTER — Other Ambulatory Visit: Payer: Self-pay | Admitting: *Deleted

## 2017-06-08 ENCOUNTER — Ambulatory Visit: Payer: Self-pay | Admitting: *Deleted

## 2017-06-08 NOTE — Patient Outreach (Signed)
[place] [facility]) Care Management  06/08/2017  [name] 10/28/1920 [id number]   Phone call to Marisue Ivan planner at [facility] to follow up on patient's progress in rehab. Per Janett Billow, Palliative care consult has been completed, however there is no discharge date set yet. It will be up to patient's son to accept Palliative care services for patient upon discharge. Per Janett Billow, patient's son is aware of patient's need for increased supervision in the home and will determine in home help versus long term care based on her progress in rehab.  Return call to [facility] from [facility] care, update provided.   Plan: This social worker will plan to visit patient on 06/10/17 for continued follow upon her progress in rehab and to assist with discharge planning.    [name] [facility] Care Management [phone]

## 2017-06-09 DIAGNOSIS — E119 Type 2 diabetes mellitus without complications: Secondary | ICD-10-CM | POA: Diagnosis not present

## 2017-06-09 DIAGNOSIS — N2581 Secondary hyperparathyroidism of renal origin: Secondary | ICD-10-CM | POA: Diagnosis not present

## 2017-06-09 DIAGNOSIS — D631 Anemia in chronic kidney disease: Secondary | ICD-10-CM | POA: Diagnosis not present

## 2017-06-09 DIAGNOSIS — N186 End stage renal disease: Secondary | ICD-10-CM | POA: Diagnosis not present

## 2017-06-10 ENCOUNTER — Other Ambulatory Visit: Payer: Self-pay | Admitting: *Deleted

## 2017-06-10 DIAGNOSIS — A0472 Enterocolitis due to Clostridium difficile, not specified as recurrent: Secondary | ICD-10-CM | POA: Diagnosis not present

## 2017-06-10 DIAGNOSIS — K626 Ulcer of anus and rectum: Secondary | ICD-10-CM | POA: Diagnosis not present

## 2017-06-10 DIAGNOSIS — K5909 Other constipation: Secondary | ICD-10-CM | POA: Diagnosis not present

## 2017-06-10 NOTE — Patient Outreach (Signed)
[place] [facility]) Care Management  06/10/2017  [name] [birth year] [id number]  Post Acute visit to see patient, however patient was out at a medical appointment when this social worker arrived. Phone call placed to patient's son to discuss potential discharge plans.. Voicemail message left for a return call. Attempt made to discuss patient's progress with discharge planner essica Sherral Hammers, however she was in a meeting and not available.   Plan: This Education officer, museum will follow up with patient within 1 week.           This Education officer, museum will await return call from patient's son and will follow up with            discharge planner.    [name] [facility] Care Management [phone]

## 2017-06-11 DIAGNOSIS — D631 Anemia in chronic kidney disease: Secondary | ICD-10-CM | POA: Diagnosis not present

## 2017-06-11 DIAGNOSIS — E119 Type 2 diabetes mellitus without complications: Secondary | ICD-10-CM | POA: Diagnosis not present

## 2017-06-11 DIAGNOSIS — N2581 Secondary hyperparathyroidism of renal origin: Secondary | ICD-10-CM | POA: Diagnosis not present

## 2017-06-11 DIAGNOSIS — N186 End stage renal disease: Secondary | ICD-10-CM | POA: Diagnosis not present

## 2017-06-14 DIAGNOSIS — N186 End stage renal disease: Secondary | ICD-10-CM | POA: Diagnosis not present

## 2017-06-14 DIAGNOSIS — E119 Type 2 diabetes mellitus without complications: Secondary | ICD-10-CM | POA: Diagnosis not present

## 2017-06-14 DIAGNOSIS — D631 Anemia in chronic kidney disease: Secondary | ICD-10-CM | POA: Diagnosis not present

## 2017-06-14 DIAGNOSIS — N2581 Secondary hyperparathyroidism of renal origin: Secondary | ICD-10-CM | POA: Diagnosis not present

## 2017-06-15 ENCOUNTER — Other Ambulatory Visit: Payer: Self-pay | Admitting: *Deleted

## 2017-06-15 DIAGNOSIS — E039 Hypothyroidism, unspecified: Secondary | ICD-10-CM | POA: Diagnosis not present

## 2017-06-15 DIAGNOSIS — N186 End stage renal disease: Secondary | ICD-10-CM | POA: Diagnosis not present

## 2017-06-15 DIAGNOSIS — Z8719 Personal history of other diseases of the digestive system: Secondary | ICD-10-CM | POA: Diagnosis not present

## 2017-06-15 DIAGNOSIS — S329XXD Fracture of unspecified parts of lumbosacral spine and pelvis, subsequent encounter for fracture with routine healing: Secondary | ICD-10-CM | POA: Diagnosis not present

## 2017-06-15 DIAGNOSIS — I10 Essential (primary) hypertension: Secondary | ICD-10-CM | POA: Diagnosis not present

## 2017-06-15 NOTE — Patient Outreach (Signed)
[place] [facility]) Care Management  06/15/2017  [name] January 20, 1920 [id number]   Phone call to patient's son who confirms plan for patient to go to an ALF placement before discharge home. Per patient's son, he does not feel that she is strong enough to return home and will need increased supervision and assisted to avoid falls. Per patient, he has not contacted the discharge planner at [facility] to inform tem of this plan. He also does not have a facility in mind.  This Education officer, museum strongly encouraged patient's son to discuss this plan with the discharge planner at [facility] so that placement can be identified in a timely manner.   Phone call made to [name], discharge planner at [facility] to inform her of plan for possible ALF placement post discharge from rehab.   [name] [facility] Care Management [phone]

## 2017-06-15 NOTE — Patient Outreach (Addendum)
[place] [facility]) Care Management  06/15/2017  [name] June 14, 1920 [id number]   Post Acute Coordination phone call to discharge planner [name]. Per [name], there is no discharge date set for patient yet, however patient will be followed by palliative care. It is continued to be stressed to patient's son that  patient will need increased  home monitoring and care post discharge from [facility].    This social met with patient at bedside. Patient states that she does not believe that she is strong enough to return home and would like to look into an ALF that would take her with the need for dialysis 3 days per week. Per patient, "my son is taking care of all this, you need to talk with him". "I will do whatever he says".    Plan: This social worker will contact patient's son by phone to confirm discharge plan.   [name] [facility] Care Management [phone]

## 2017-06-16 DIAGNOSIS — N186 End stage renal disease: Secondary | ICD-10-CM | POA: Diagnosis not present

## 2017-06-16 DIAGNOSIS — E119 Type 2 diabetes mellitus without complications: Secondary | ICD-10-CM | POA: Diagnosis not present

## 2017-06-16 DIAGNOSIS — D631 Anemia in chronic kidney disease: Secondary | ICD-10-CM | POA: Diagnosis not present

## 2017-06-16 DIAGNOSIS — N2581 Secondary hyperparathyroidism of renal origin: Secondary | ICD-10-CM | POA: Diagnosis not present

## 2017-06-18 DIAGNOSIS — N186 End stage renal disease: Secondary | ICD-10-CM | POA: Diagnosis not present

## 2017-06-18 DIAGNOSIS — N2581 Secondary hyperparathyroidism of renal origin: Secondary | ICD-10-CM | POA: Diagnosis not present

## 2017-06-18 DIAGNOSIS — D631 Anemia in chronic kidney disease: Secondary | ICD-10-CM | POA: Diagnosis not present

## 2017-06-18 DIAGNOSIS — E119 Type 2 diabetes mellitus without complications: Secondary | ICD-10-CM | POA: Diagnosis not present

## 2017-06-21 ENCOUNTER — Other Ambulatory Visit: Payer: Self-pay | Admitting: *Deleted

## 2017-06-21 DIAGNOSIS — N2581 Secondary hyperparathyroidism of renal origin: Secondary | ICD-10-CM | POA: Diagnosis not present

## 2017-06-21 DIAGNOSIS — E119 Type 2 diabetes mellitus without complications: Secondary | ICD-10-CM | POA: Diagnosis not present

## 2017-06-21 DIAGNOSIS — D631 Anemia in chronic kidney disease: Secondary | ICD-10-CM | POA: Diagnosis not present

## 2017-06-21 DIAGNOSIS — N186 End stage renal disease: Secondary | ICD-10-CM | POA: Diagnosis not present

## 2017-06-21 NOTE — Patient Outreach (Signed)
[place] [facility]) Care Management  06/21/2017  SHAKEYA KERKMAN 04-30-1920 [id number]   Phone call to discharge planner [name] at [facility] resources to follow up on ALF placement for patient post discharge from SNF requested by patient's son. Voicemail message left for a return call.   [name] [facility] Care Management [phone]

## 2017-06-22 ENCOUNTER — Other Ambulatory Visit: Payer: Self-pay | Admitting: *Deleted

## 2017-06-22 NOTE — Patient Outreach (Addendum)
[place] [facility]) Care Management  06/22/2017  [name] 1920-06-10 [id number]   Post Acute visit with patient at [facility].Marland KitchenMarland KitchenPer patient, she is unaware of a discharge date as her son is working to "find me a place to go" "He is taking care of all of it". Per patient, she continues to receive PT but has not been today. Discharge planner [name], not in her office at the time of visit for an update on patient's progress and long term care plan. Phone call made to patient's son, voicemail message left requesting a return call to discuss patient's long term care plan.  Late Entry: This Education officer, museum met with Pharmacist, hospital. Janett Billow confirms plan for discharge to ALF on Friday, 06/25/17. Per Janett Billow, patient will privately pay and is looking into a bed at [facility] [place].   Plan: This social worker will follow up with the discharge planner to confirm discharge date and facility chosen within 1 week.   [name] [facility] Care Management [phone]

## 2017-06-22 NOTE — Patient Outreach (Signed)
[place] [facility]) Care Management  06/22/2017  [name] Mar 21, 1920 [id number]  Phone call to patient's son to confirm long term plan for patient.  Voicemail message left requesting a return phone call.   [name] [facility] Care Management [phone]

## 2017-06-24 DIAGNOSIS — E119 Type 2 diabetes mellitus without complications: Secondary | ICD-10-CM | POA: Diagnosis not present

## 2017-06-24 DIAGNOSIS — N186 End stage renal disease: Secondary | ICD-10-CM | POA: Diagnosis not present

## 2017-06-24 DIAGNOSIS — D631 Anemia in chronic kidney disease: Secondary | ICD-10-CM | POA: Diagnosis not present

## 2017-06-24 DIAGNOSIS — N2581 Secondary hyperparathyroidism of renal origin: Secondary | ICD-10-CM | POA: Diagnosis not present

## 2017-06-25 ENCOUNTER — Telehealth: Payer: Self-pay | Admitting: Internal Medicine

## 2017-06-25 DIAGNOSIS — N186 End stage renal disease: Secondary | ICD-10-CM | POA: Diagnosis not present

## 2017-06-25 DIAGNOSIS — N2581 Secondary hyperparathyroidism of renal origin: Secondary | ICD-10-CM | POA: Diagnosis not present

## 2017-06-25 DIAGNOSIS — E119 Type 2 diabetes mellitus without complications: Secondary | ICD-10-CM | POA: Diagnosis not present

## 2017-06-25 DIAGNOSIS — D631 Anemia in chronic kidney disease: Secondary | ICD-10-CM | POA: Diagnosis not present

## 2017-06-25 NOTE — Telephone Encounter (Signed)
See below crm when do you want pt to come in?  Copied from [place] [phone]. Topic: Appointment Scheduling - Scheduling Inquiry for Clinic >> Jun 24, 2017  2:04 PM Conception Chancy, NT wrote: Reason for CRM: Janett Billow is calling from [facility] and states the patient is leaving 06/25/17. She is needing a follow up with Dr. Silvio Pate on a Tuesday or Thursday due to dialysis the other days. Please contact to schedule.  [phone]

## 2017-06-27 ENCOUNTER — Inpatient Hospital Stay
Admission: EM | Admit: 2017-06-27 | Discharge: 2017-07-08 | DRG: 291 | Disposition: A | Discharge status: Skilled Nursing Facility | Payer: Medicare Other | Attending: Family Medicine | Admitting: Family Medicine

## 2017-06-27 ENCOUNTER — Other Ambulatory Visit: Payer: Self-pay

## 2017-06-27 ENCOUNTER — Encounter: Payer: Self-pay | Admitting: Emergency Medicine

## 2017-06-27 ENCOUNTER — Emergency Department: Payer: Medicare Other

## 2017-06-27 DIAGNOSIS — B9620 Unspecified Escherichia coli [E. coli] as the cause of diseases classified elsewhere: Secondary | ICD-10-CM | POA: Diagnosis not present

## 2017-06-27 DIAGNOSIS — R569 Unspecified convulsions: Secondary | ICD-10-CM | POA: Diagnosis not present

## 2017-06-27 DIAGNOSIS — E039 Hypothyroidism, unspecified: Secondary | ICD-10-CM | POA: Diagnosis present

## 2017-06-27 DIAGNOSIS — R627 Adult failure to thrive: Secondary | ICD-10-CM

## 2017-06-27 DIAGNOSIS — M8580 Other specified disorders of bone density and structure, unspecified site: Secondary | ICD-10-CM | POA: Diagnosis present

## 2017-06-27 DIAGNOSIS — K219 Gastro-esophageal reflux disease without esophagitis: Secondary | ICD-10-CM | POA: Diagnosis present

## 2017-06-27 DIAGNOSIS — Z7189 Other specified counseling: Secondary | ICD-10-CM

## 2017-06-27 DIAGNOSIS — I5033 Acute on chronic diastolic (congestive) heart failure: Secondary | ICD-10-CM | POA: Diagnosis not present

## 2017-06-27 DIAGNOSIS — E785 Hyperlipidemia, unspecified: Secondary | ICD-10-CM | POA: Diagnosis present

## 2017-06-27 DIAGNOSIS — N186 End stage renal disease: Secondary | ICD-10-CM

## 2017-06-27 DIAGNOSIS — Z515 Encounter for palliative care: Secondary | ICD-10-CM | POA: Diagnosis not present

## 2017-06-27 DIAGNOSIS — Z951 Presence of aortocoronary bypass graft: Secondary | ICD-10-CM

## 2017-06-27 DIAGNOSIS — Z888 Allergy status to other drugs, medicaments and biological substances status: Secondary | ICD-10-CM

## 2017-06-27 DIAGNOSIS — G9349 Other encephalopathy: Secondary | ICD-10-CM | POA: Diagnosis not present

## 2017-06-27 DIAGNOSIS — F419 Anxiety disorder, unspecified: Secondary | ICD-10-CM | POA: Diagnosis present

## 2017-06-27 DIAGNOSIS — Z66 Do not resuscitate: Secondary | ICD-10-CM | POA: Diagnosis not present

## 2017-06-27 DIAGNOSIS — N2581 Secondary hyperparathyroidism of renal origin: Secondary | ICD-10-CM | POA: Diagnosis present

## 2017-06-27 DIAGNOSIS — B961 Klebsiella pneumoniae [K. pneumoniae] as the cause of diseases classified elsewhere: Secondary | ICD-10-CM | POA: Diagnosis not present

## 2017-06-27 DIAGNOSIS — G9340 Encephalopathy, unspecified: Secondary | ICD-10-CM | POA: Diagnosis not present

## 2017-06-27 DIAGNOSIS — R4182 Altered mental status, unspecified: Secondary | ICD-10-CM | POA: Diagnosis not present

## 2017-06-27 DIAGNOSIS — H9190 Unspecified hearing loss, unspecified ear: Secondary | ICD-10-CM | POA: Diagnosis present

## 2017-06-27 DIAGNOSIS — J189 Pneumonia, unspecified organism: Secondary | ICD-10-CM | POA: Diagnosis not present

## 2017-06-27 DIAGNOSIS — D631 Anemia in chronic kidney disease: Secondary | ICD-10-CM | POA: Diagnosis not present

## 2017-06-27 DIAGNOSIS — Z79899 Other long term (current) drug therapy: Secondary | ICD-10-CM

## 2017-06-27 DIAGNOSIS — Z881 Allergy status to other antibiotic agents status: Secondary | ICD-10-CM

## 2017-06-27 DIAGNOSIS — I2510 Atherosclerotic heart disease of native coronary artery without angina pectoris: Secondary | ICD-10-CM | POA: Diagnosis not present

## 2017-06-27 DIAGNOSIS — M1990 Unspecified osteoarthritis, unspecified site: Secondary | ICD-10-CM | POA: Diagnosis present

## 2017-06-27 DIAGNOSIS — Z96653 Presence of artificial knee joint, bilateral: Secondary | ICD-10-CM

## 2017-06-27 DIAGNOSIS — I272 Pulmonary hypertension, unspecified: Secondary | ICD-10-CM | POA: Diagnosis present

## 2017-06-27 DIAGNOSIS — I120 Hypertensive chronic kidney disease with stage 5 chronic kidney disease or end stage renal disease: Secondary | ICD-10-CM | POA: Diagnosis not present

## 2017-06-27 DIAGNOSIS — Z992 Dependence on renal dialysis: Secondary | ICD-10-CM

## 2017-06-27 DIAGNOSIS — E569 Vitamin deficiency, unspecified: Secondary | ICD-10-CM | POA: Diagnosis not present

## 2017-06-27 DIAGNOSIS — Z87891 Personal history of nicotine dependence: Secondary | ICD-10-CM

## 2017-06-27 DIAGNOSIS — Z953 Presence of xenogenic heart valve: Secondary | ICD-10-CM

## 2017-06-27 DIAGNOSIS — J9601 Acute respiratory failure with hypoxia: Secondary | ICD-10-CM | POA: Diagnosis not present

## 2017-06-27 DIAGNOSIS — J811 Chronic pulmonary edema: Secondary | ICD-10-CM | POA: Diagnosis not present

## 2017-06-27 DIAGNOSIS — J9620 Acute and chronic respiratory failure, unspecified whether with hypoxia or hypercapnia: Secondary | ICD-10-CM | POA: Diagnosis not present

## 2017-06-27 DIAGNOSIS — T82838A Hemorrhage of vascular prosthetic devices, implants and grafts, initial encounter: Secondary | ICD-10-CM | POA: Diagnosis not present

## 2017-06-27 DIAGNOSIS — R05 Cough: Secondary | ICD-10-CM | POA: Diagnosis not present

## 2017-06-27 DIAGNOSIS — E43 Unspecified severe protein-calorie malnutrition: Secondary | ICD-10-CM | POA: Diagnosis not present

## 2017-06-27 DIAGNOSIS — E1151 Type 2 diabetes mellitus with diabetic peripheral angiopathy without gangrene: Secondary | ICD-10-CM | POA: Diagnosis present

## 2017-06-27 DIAGNOSIS — R5381 Other malaise: Secondary | ICD-10-CM | POA: Diagnosis not present

## 2017-06-27 DIAGNOSIS — R7989 Other specified abnormal findings of blood chemistry: Secondary | ICD-10-CM | POA: Diagnosis present

## 2017-06-27 DIAGNOSIS — E1122 Type 2 diabetes mellitus with diabetic chronic kidney disease: Secondary | ICD-10-CM | POA: Diagnosis not present

## 2017-06-27 DIAGNOSIS — N390 Urinary tract infection, site not specified: Secondary | ICD-10-CM | POA: Diagnosis not present

## 2017-06-27 DIAGNOSIS — N189 Chronic kidney disease, unspecified: Secondary | ICD-10-CM | POA: Diagnosis not present

## 2017-06-27 DIAGNOSIS — Z681 Body mass index (BMI) 19 or less, adult: Secondary | ICD-10-CM | POA: Diagnosis not present

## 2017-06-27 DIAGNOSIS — K5713 Diverticulitis of small intestine without perforation or abscess with bleeding: Secondary | ICD-10-CM | POA: Diagnosis not present

## 2017-06-27 DIAGNOSIS — I132 Hypertensive heart and chronic kidney disease with heart failure and with stage 5 chronic kidney disease, or end stage renal disease: Secondary | ICD-10-CM | POA: Diagnosis not present

## 2017-06-27 DIAGNOSIS — J9600 Acute respiratory failure, unspecified whether with hypoxia or hypercapnia: Secondary | ICD-10-CM | POA: Diagnosis not present

## 2017-06-27 DIAGNOSIS — M6281 Muscle weakness (generalized): Secondary | ICD-10-CM | POA: Diagnosis not present

## 2017-06-27 DIAGNOSIS — Z89422 Acquired absence of other left toe(s): Secondary | ICD-10-CM

## 2017-06-27 DIAGNOSIS — Z85828 Personal history of other malignant neoplasm of skin: Secondary | ICD-10-CM

## 2017-06-27 DIAGNOSIS — N3000 Acute cystitis without hematuria: Secondary | ICD-10-CM | POA: Diagnosis not present

## 2017-06-27 DIAGNOSIS — N3001 Acute cystitis with hematuria: Secondary | ICD-10-CM | POA: Diagnosis present

## 2017-06-27 DIAGNOSIS — R531 Weakness: Secondary | ICD-10-CM | POA: Diagnosis not present

## 2017-06-27 DIAGNOSIS — T82868A Thrombosis of vascular prosthetic devices, implants and grafts, initial encounter: Secondary | ICD-10-CM | POA: Diagnosis not present

## 2017-06-27 DIAGNOSIS — L89899 Pressure ulcer of other site, unspecified stage: Secondary | ICD-10-CM | POA: Diagnosis not present

## 2017-06-27 DIAGNOSIS — J9690 Respiratory failure, unspecified, unspecified whether with hypoxia or hypercapnia: Secondary | ICD-10-CM | POA: Diagnosis not present

## 2017-06-27 DIAGNOSIS — I10 Essential (primary) hypertension: Secondary | ICD-10-CM | POA: Diagnosis not present

## 2017-06-27 DIAGNOSIS — Z882 Allergy status to sulfonamides status: Secondary | ICD-10-CM

## 2017-06-27 DIAGNOSIS — Z794 Long term (current) use of insulin: Secondary | ICD-10-CM

## 2017-06-27 DIAGNOSIS — I509 Heart failure, unspecified: Secondary | ICD-10-CM | POA: Diagnosis not present

## 2017-06-27 DIAGNOSIS — R5383 Other fatigue: Secondary | ICD-10-CM | POA: Diagnosis not present

## 2017-06-27 LAB — URINALYSIS, COMPLETE (UACMP) WITH MICROSCOPIC
Bilirubin Urine: NEGATIVE
Glucose, UA: NEGATIVE mg/dL
Ketones, ur: NEGATIVE mg/dL
Nitrite: NEGATIVE
PROTEIN: 100 mg/dL — AB
RBC / HPF: >50 RBC/hpf — ABNORMAL HIGH (ref 0–5)
Specific Gravity, Urine: 1.013 (ref 1.005–1.030)
pH: 6 (ref 5.0–8.0)

## 2017-06-27 LAB — TROPONIN I: TROPONIN I: 0.04 ng/mL — AB (ref <0.03)

## 2017-06-27 LAB — COMPREHENSIVE METABOLIC PANEL
ALBUMIN: 3.3 g/dL — AB (ref 3.5–5.0)
ALT: 13 U/L — AB (ref 14–54)
AST: 24 U/L (ref 15–41)
Alkaline Phosphatase: 177 U/L — ABNORMAL HIGH (ref 38–126)
Anion gap: 14 (ref 5–15)
BUN: 34 mg/dL — AB (ref 6–20)
CHLORIDE: 93 mmol/L — AB (ref 101–111)
CO2: 26 mmol/L (ref 22–32)
CREATININE: 3.99 mg/dL — AB (ref 0.44–1.00)
Calcium: 9 mg/dL (ref 8.9–10.3)
GFR calc Af Amer: 10 mL/min — ABNORMAL LOW (ref >60)
GFR, EST NON AFRICAN AMERICAN: 9 mL/min — AB (ref >60)
GLUCOSE: 119 mg/dL — AB (ref 65–99)
Potassium: 3.9 mmol/L (ref 3.5–5.1)
Sodium: 133 mmol/L — ABNORMAL LOW (ref 135–145)
Total Bilirubin: 1.2 mg/dL (ref 0.3–1.2)
Total Protein: 8.2 g/dL — ABNORMAL HIGH (ref 6.5–8.1)

## 2017-06-27 LAB — CBC WITH DIFFERENTIAL/PLATELET
Basophils Absolute: 0 10*3/uL (ref 0–0.1)
Basophils Relative: 1 %
EOS PCT: 3 %
Eosinophils Absolute: 0.3 10*3/uL (ref 0–0.7)
HEMATOCRIT: 37.9 % (ref 35.0–47.0)
Hemoglobin: 12.4 g/dL (ref 12.0–16.0)
LYMPHS ABS: 0.4 10*3/uL — AB (ref 1.0–3.6)
LYMPHS PCT: 4 %
MCH: 31.5 pg (ref 26.0–34.0)
MCHC: 32.7 g/dL (ref 32.0–36.0)
MCV: 96.5 fL (ref 80.0–100.0)
MONOS PCT: 10 %
Monocytes Absolute: 1 10*3/uL — ABNORMAL HIGH (ref 0.2–0.9)
Neutro Abs: 8.6 10*3/uL — ABNORMAL HIGH (ref 1.4–6.5)
Neutrophils Relative %: 82 %
PLATELETS: 179 10*3/uL (ref 150–440)
RBC: 3.93 MIL/uL (ref 3.80–5.20)
RDW: 19.3 % — ABNORMAL HIGH (ref 11.5–14.5)
WBC: 10.4 10*3/uL (ref 3.6–11.0)

## 2017-06-27 LAB — LACTIC ACID, PLASMA
LACTIC ACID, VENOUS: 1.1 mmol/L (ref 0.5–1.9)
Lactic Acid, Venous: 1.2 mmol/L (ref 0.5–1.9)

## 2017-06-27 LAB — GLUCOSE, CAPILLARY
GLUCOSE-CAPILLARY: 95 mg/dL (ref 65–99)
Glucose-Capillary: 126 mg/dL — ABNORMAL HIGH (ref 65–99)

## 2017-06-27 LAB — BRAIN NATRIURETIC PEPTIDE: B Natriuretic Peptide: 1988 pg/mL — ABNORMAL HIGH (ref 0.0–100.0)

## 2017-06-27 LAB — MRSA PCR SCREENING: MRSA by PCR: NEGATIVE

## 2017-06-27 LAB — PROCALCITONIN: PROCALCITONIN: 0.48 ng/mL

## 2017-06-27 MED ORDER — HYDROCODONE-ACETAMINOPHEN 5-325 MG PO TABS: 1.0000 | ORAL_TABLET | ORAL | Status: DC | PRN

## 2017-06-27 MED ORDER — ACETAMINOPHEN 325 MG PO TABS: 650.0000 mg | ORAL_TABLET | Freq: Four times a day (QID) | ORAL | Status: DC | PRN

## 2017-06-27 MED ORDER — IPRATROPIUM-ALBUTEROL 0.5-2.5 (3) MG/3ML IN SOLN
3.0000 mL | Freq: Four times a day (QID) | RESPIRATORY_TRACT | Status: DC
  Administered 2017-06-27 – 2017-06-30 (×11): 3 mL via RESPIRATORY_TRACT
  Filled 2017-06-27 (×10): qty 3

## 2017-06-27 MED ORDER — ALBUTEROL SULFATE (2.5 MG/3ML) 0.083% IN NEBU
2.5000 mg | INHALATION_SOLUTION | RESPIRATORY_TRACT | Status: DC | PRN
  Administered 2017-06-30 (×2): 2.5 mg via RESPIRATORY_TRACT

## 2017-06-27 MED ORDER — HYDRALAZINE HCL 25 MG PO TABS
25.0000 mg | ORAL_TABLET | Freq: Four times a day (QID) | ORAL | Status: DC
  Administered 2017-06-27 – 2017-07-07 (×21): 25 mg via ORAL
  Filled 2017-06-27 (×23): qty 1

## 2017-06-27 MED ORDER — PRO-STAT SUGAR FREE PO LIQD
30.0000 mL | Freq: Three times a day (TID) | ORAL | Status: DC
  Administered 2017-06-29 – 2017-07-08 (×9): 30 mL via ORAL

## 2017-06-27 MED ORDER — ACETAMINOPHEN 650 MG RE SUPP: 650.0000 mg | Freq: Four times a day (QID) | RECTAL | Status: DC | PRN

## 2017-06-27 MED ORDER — VITAMIN C 500 MG PO TABS
500.0000 mg | ORAL_TABLET | Freq: Two times a day (BID) | ORAL | Status: DC
  Administered 2017-06-27 – 2017-07-08 (×14): 500 mg via ORAL
  Filled 2017-06-27 (×23): qty 1

## 2017-06-27 MED ORDER — AMLODIPINE BESYLATE 10 MG PO TABS
10.0000 mg | ORAL_TABLET | Freq: Every day | ORAL | Status: DC
  Administered 2017-06-27 – 2017-07-08 (×8): 10 mg via ORAL
  Filled 2017-06-27 (×8): qty 1

## 2017-06-27 MED ORDER — BISACODYL 5 MG PO TBEC
5.0000 mg | DELAYED_RELEASE_TABLET | Freq: Every day | ORAL | Status: DC | PRN
  Administered 2017-06-27: 5 mg via ORAL
  Filled 2017-06-27: qty 1

## 2017-06-27 MED ORDER — INSULIN ASPART 100 UNIT/ML ~~LOC~~ SOLN: 0.0000 [IU] | Freq: Every day | SUBCUTANEOUS | Status: DC

## 2017-06-27 MED ORDER — SODIUM CHLORIDE 0.9% FLUSH
3.0000 mL | Freq: Two times a day (BID) | INTRAVENOUS | Status: DC
  Administered 2017-06-27 – 2017-07-07 (×18): 3 mL via INTRAVENOUS

## 2017-06-27 MED ORDER — INSULIN ASPART 100 UNIT/ML ~~LOC~~ SOLN
0.0000 [IU] | Freq: Three times a day (TID) | SUBCUTANEOUS | Status: DC
  Administered 2017-06-27 – 2017-06-29 (×3): 1 [IU] via SUBCUTANEOUS
  Administered 2017-07-01: 2 [IU] via SUBCUTANEOUS
  Administered 2017-07-01 – 2017-07-02 (×2): 1 [IU] via SUBCUTANEOUS
  Administered 2017-07-03: 2 [IU] via SUBCUTANEOUS
  Administered 2017-07-03: 1 [IU] via SUBCUTANEOUS
  Administered 2017-07-05: 2 [IU] via SUBCUTANEOUS
  Administered 2017-07-06 – 2017-07-07 (×2): 1 [IU] via SUBCUTANEOUS
  Administered 2017-07-08: 2 [IU] via SUBCUTANEOUS
  Filled 2017-06-27 (×12): qty 1

## 2017-06-27 MED ORDER — HYDRALAZINE HCL 20 MG/ML IJ SOLN
10.0000 mg | Freq: Once | INTRAMUSCULAR | Status: AC
  Administered 2017-06-27: 10 mg via INTRAVENOUS
  Filled 2017-06-27: qty 1

## 2017-06-27 MED ORDER — CALCIUM CARBONATE ANTACID 500 MG PO CHEW
1.0000 | CHEWABLE_TABLET | Freq: Three times a day (TID) | ORAL | Status: DC
  Administered 2017-06-27 – 2017-07-08 (×15): 200 mg via ORAL
  Filled 2017-06-27 (×19): qty 1

## 2017-06-27 MED ORDER — ONDANSETRON HCL 4 MG PO TABS: 4.0000 mg | ORAL_TABLET | Freq: Four times a day (QID) | ORAL | Status: DC | PRN

## 2017-06-27 MED ORDER — METOPROLOL TARTRATE 50 MG PO TABS
50.0000 mg | ORAL_TABLET | Freq: Two times a day (BID) | ORAL | Status: DC
  Administered 2017-06-27 – 2017-07-07 (×12): 50 mg via ORAL
  Filled 2017-06-27 (×13): qty 1

## 2017-06-27 MED ORDER — VANCOMYCIN HCL IN DEXTROSE 1-5 GM/200ML-% IV SOLN
1000.0000 mg | Freq: Once | INTRAVENOUS | Status: AC
  Administered 2017-06-27: 1000 mg via INTRAVENOUS
  Filled 2017-06-27: qty 200

## 2017-06-27 MED ORDER — VANCOMYCIN HCL 500 MG IV SOLR
500.0000 mg | INTRAVENOUS | Status: DC | PRN
  Filled 2017-06-27: qty 500

## 2017-06-27 MED ORDER — SODIUM CHLORIDE 0.9 % IV SOLN
2.0000 g | Freq: Once | INTRAVENOUS | Status: AC
  Administered 2017-06-27: 2 g via INTRAVENOUS
  Filled 2017-06-27: qty 2

## 2017-06-27 MED ORDER — POLYVINYL ALCOHOL 1.4 % OP SOLN
1.0000 [drp] | OPHTHALMIC | Status: DC | PRN
  Administered 2017-06-27 – 2017-07-04 (×4): 1 [drp] via OPHTHALMIC
  Filled 2017-06-27: qty 15

## 2017-06-27 MED ORDER — GUAIFENESIN-DM 100-10 MG/5ML PO SYRP
5.0000 mL | ORAL_SOLUTION | ORAL | Status: DC | PRN
  Administered 2017-06-27 – 2017-07-08 (×4): 5 mL via ORAL
  Filled 2017-06-27 (×4): qty 5

## 2017-06-27 MED ORDER — SODIUM CHLORIDE 0.9 % IV SOLN
1.0000 g | INTRAVENOUS | Status: DC
  Administered 2017-06-28: 1 g via INTRAVENOUS
  Filled 2017-06-27 (×2): qty 1

## 2017-06-27 MED ORDER — SODIUM CHLORIDE 0.9% FLUSH: 3.0000 mL | INTRAVENOUS | Status: DC | PRN

## 2017-06-27 MED ORDER — FOLIC ACID 1 MG PO TABS
0.5000 mg | ORAL_TABLET | Freq: Every day | ORAL | Status: DC
  Administered 2017-06-27 – 2017-07-08 (×9): 0.5 mg via ORAL
  Filled 2017-06-27 (×10): qty 1

## 2017-06-27 MED ORDER — GANCICLOVIR 0.15 % OP GEL
1.0000 [drp] | Freq: Four times a day (QID) | OPHTHALMIC | Status: DC
  Administered 2017-06-27 – 2017-07-08 (×25): 1 [drp] via OPHTHALMIC
  Filled 2017-06-27 (×2): qty 5

## 2017-06-27 MED ORDER — ONDANSETRON HCL 4 MG/2ML IJ SOLN
4.0000 mg | Freq: Four times a day (QID) | INTRAMUSCULAR | Status: DC | PRN
  Administered 2017-06-27: 4 mg via INTRAVENOUS
  Filled 2017-06-27: qty 2

## 2017-06-27 MED ORDER — PANTOPRAZOLE SODIUM 40 MG PO TBEC
40.0000 mg | DELAYED_RELEASE_TABLET | Freq: Two times a day (BID) | ORAL | Status: DC
  Administered 2017-06-27 – 2017-07-08 (×13): 40 mg via ORAL
  Filled 2017-06-27 (×16): qty 1

## 2017-06-27 MED ORDER — LEVOTHYROXINE SODIUM 50 MCG PO TABS
75.0000 ug | ORAL_TABLET | Freq: Every day | ORAL | Status: DC
Start: 2017-06-27 — End: 2017-07-08
  Administered 2017-06-27 – 2017-07-08 (×9): 75 ug via ORAL
  Filled 2017-06-27 (×9): qty 2

## 2017-06-27 MED ORDER — SENNOSIDES-DOCUSATE SODIUM 8.6-50 MG PO TABS: 1.0000 | ORAL_TABLET | Freq: Every evening | ORAL | Status: DC | PRN

## 2017-06-27 MED ORDER — LIDOCAINE HCL (PF) 1 % IJ SOLN
INTRAMUSCULAR | Status: AC
  Filled 2017-06-27: qty 5

## 2017-06-27 MED ORDER — SODIUM CHLORIDE 0.9 % IV SOLN
250.0000 mL | INTRAVENOUS | Status: DC | PRN
  Administered 2017-06-28: 250 mL via INTRAVENOUS

## 2017-06-27 MED ORDER — PRAVASTATIN SODIUM 20 MG PO TABS
40.0000 mg | ORAL_TABLET | Freq: Every day | ORAL | Status: DC
  Administered 2017-06-27 – 2017-07-08 (×9): 40 mg via ORAL
  Filled 2017-06-27 (×9): qty 2

## 2017-06-27 MED ORDER — POLYETHYLENE GLYCOL 3350 17 G PO PACK
17.0000 g | PACK | Freq: Every day | ORAL | Status: DC
  Administered 2017-06-27 – 2017-07-05 (×3): 17 g via ORAL
  Filled 2017-06-27 (×5): qty 1

## 2017-06-27 MED ORDER — HEPARIN SODIUM (PORCINE) 5000 UNIT/ML IJ SOLN
5000.0000 [IU] | Freq: Three times a day (TID) | INTRAMUSCULAR | Status: DC
  Administered 2017-06-27 – 2017-07-07 (×13): 5000 [IU] via SUBCUTANEOUS
  Filled 2017-06-27 (×18): qty 1

## 2017-06-27 MED ORDER — LIDOCAINE HCL (PF) 1 % IJ SOLN
5.0000 mL | Freq: Once | INTRAMUSCULAR | Status: AC
  Administered 2017-06-27: 15:00:00

## 2017-06-27 MED ORDER — ZINC SULFATE 220 (50 ZN) MG PO CAPS
220.0000 mg | ORAL_CAPSULE | Freq: Every day | ORAL | Status: DC
  Administered 2017-06-27 – 2017-07-05 (×5): 220 mg via ORAL
  Filled 2017-06-27 (×9): qty 1

## 2017-06-27 NOTE — Progress Notes (Signed)
Pharmacy Antibiotic Note  [name] is a 84 y.o. female admitted on 06/27/2017 with pneumonia.  Pharmacy has been consulted for vancomycin cefepime dosing.  Plan: Vancomycin 1000mg  x 1 dose, 500mg  IV every dialysis session.  Goal trough 15-20 mcg/mL. cefepime 1gm iv q24h   Height: 5\' 2"  (157.5 cm) Weight: 97 lb (44 kg) IBW/kg (Calculated) : 50.1  Temp (24hrs), Avg:98.9 F (37.2 C), Min:98.9 F (37.2 C), Max:98.9 F (37.2 C)  Recent Labs  Lab 06/27/17 1357  WBC 10.4  CREATININE 3.99*  LATICACIDVEN 1.2    Estimated Creatinine Clearance: 5.9 mL/min (A) (by C-G formula based on SCr of 3.99 mg/dL (H)).    Allergies  Allergen Reactions  . Nitrofurantoin Itching  . Ace Inhibitors   . Captopril Other (See Comments)    REACTION: unspecified  . Enalapril Maleate Cough  . Ramipril Other (See Comments)    REACTION: unspecified  . Sulfa Antibiotics Other (See Comments)    Reaction unknown  . Verapamil Other (See Comments)    REACTION: unspecified    Antimicrobials this admission: Anti-infectives (From admission, onward)   Start     Dose/Rate Route Frequency Ordered Stop   06/28/17 1500  ceFEPIme (MAXIPIME) 1 g in sodium chloride 0.9 % 100 mL IVPB     1 g 200 mL/hr over 30 Minutes Intravenous Every 24 hours 06/27/17 1542     06/28/17 0000  vancomycin (VANCOCIN) 500 mg in sodium chloride 0.9 % 100 mL IVPB     500 mg 100 mL/hr over 60 Minutes Intravenous Every Dialysis 06/27/17 1633     06/27/17 1545  ceFEPIme (MAXIPIME) 2 g in sodium chloride 0.9 % 100 mL IVPB     2 g 200 mL/hr over 30 Minutes Intravenous  Once 06/27/17 1538 06/27/17 1615   06/27/17 1545  vancomycin (VANCOCIN) IVPB 1000 mg/200 mL premix     1,000 mg 200 mL/hr over 60 Minutes Intravenous  Once 06/27/17 1538         Microbiology results: No results found for this or any previous visit (from the past 240 hour(s)).   Thank you for allowing pharmacy to be a part of this patient's care.  Donna Christen  Rashel Okeefe 06/27/2017 4:35 PM

## 2017-06-27 NOTE — ED Notes (Signed)
Date and time results received: 06/27/17  Test: troponin  Critical Value: 0.04  Name of Provider Notified: robinson  Orders Received? Or Actions Taken?: MD notified

## 2017-06-27 NOTE — ED Provider Notes (Signed)
[facility] Emergency Department Provider Note    First MD Initiated Contact with Patient 06/27/17 1344     (approximate)  I have reviewed the triage vital signs and the nursing notes.   HISTORY  Chief Complaint Nasal Congestion and Weakness  Level V caveat:  Extremely hard of hearing  HPI [name] is a 84 y.o. female with extensive past medical history presents to the ER with worsening shortness of breath as well as weakness over the past several days she is company by her son.  Patient just recently discharged from [facility] after admission the hospital for respiratory failure.  Patient is end-stage renal disease on dialysis Monday Wednesday Friday.  Does not wear home oxygen.  Was at [facility] and son states that she was being given oxygen quite frequently but she was not discharged with oxygen.  Patient states that she feels very weak.  Has had a productive cough with phlegm.    Past Medical History:  Diagnosis Date  . Anemia    NOS  . Anxiety   . Aortic stenosis   . Cancer [facility])    Mole on top of head to be removed in July 2013  . Chronic diastolic CHF (congestive heart failure) ([place])    a. 01/2017 Echo: EF 60-65%, no rwma, Gr2 DD, mild to mod MR, mildly dil LA/RA, nl RV fxn, Sev TR, PASP 68mmHg.  . Constipation   . Coronary artery disease    a. 2009 CABG x 3 w/ bioprosthetic AVR.  . Diabetes mellitus    type II;was on Glipizide but has been off x 93mon  . Dialysis patient ([place])   . Diarrhea   . ESRD (end stage renal disease) ([place])   . GERD (gastroesophageal reflux disease)   . History of blood transfusion   . Hyperlipidemia    takes Simvastatin nightly  . Hypertension    takes Metoprolol daily  . Hypothyroidism    takes Synthroid daily  . Migraine    hx of  . Oligouria   . Osteoarthritis   . Osteopenia   . Peripheral vascular disease ([place])   . Personal history of colonic polyps   . Pulmonary hypertension ([place])   .  Urinary incontinence   . UTI (urinary tract infection)    Family History  Family history unknown: Yes   Past Surgical History:  Procedure Laterality Date  . A/V FISTULAGRAM [name] 01/07/2017   Procedure: A/V Fistulagram;  Surgeon: Algernon Huxley, MD;  Location: [place] [facility];  Service: Cardiovascular;  Laterality: [name];  . A/V SHUNT INTERVENTION [name] 01/07/2017   Procedure: A/V SHUNT INTERVENTION;  Surgeon: Algernon Huxley, MD;  Location: [place] [facility];  Service: Cardiovascular;  Laterality: [name];  . ABDOMINAL HYSTERECTOMY    . adenosine myoview  2007   benign, EF 69%  . AMPUTATION  06/18/2011   Procedure: AMPUTATION DIGIT;  Surgeon: Newt Minion, MD;  Location: [place];  Service: Orthopedics;  Laterality: Left;  Left 2nd Toe Amputation at MTP Joint  . Amputation-left great toe  7/12   Dr Marlou Sa  . AORTIC VALVE REPLACEMENT  2009  . APPENDECTOMY    . AV FISTULA PLACEMENT  12/22/2010   Procedure: ARTERIOVENOUS (AV) FISTULA CREATION;  Surgeon: Hinda Lenis, MD;  Location: [place];  Service: Vascular;  Laterality: Left;  Creation of Left Brachial-Cephalic Fistula  . CARDIAC CATHETERIZATION  2000   cad  . CAROTID ENDARTERECTOMY  2011  Right  . CATARACT EXTRACTION    . COLONOSCOPY  05/21/2017   Procedure: COLONOSCOPY;  Surgeon: Toledo, Benay Pike, MD;  Location: [facility];  Service: Gastroenterology;;  . CORONARY ARTERY BYPASS GRAFT     od  . CORONARY ARTERY BYPASS GRAFT  2009  . ESOPHAGOGASTRODUODENOSCOPY [name] 05/21/2017   Procedure: ESOPHAGOGASTRODUODENOSCOPY (EGD);  Surgeon: Toledo, Benay Pike, MD;  Location: [facility];  Service: Gastroenterology;  Laterality: [name];  . EYE SURGERY     BIlateral  . INSERTION OF DIALYSIS CATHETER  12/18/2010   Procedure: INSERTION OF DIALYSIS CATHETER;  Surgeon: Mal Misty, MD;  Location: [place];  Service: Vascular;  Laterality: Right;  . REPLACEMENT TOTAL KNEE BILATERAL  05/1998  . TONSILLECTOMY    . Traumatic Amputation of Right DIP  joint of Index Finger     Patient Active Problem List   Diagnosis Date Noted  . Pressure injury of skin 05/19/2017  . GIB (gastrointestinal bleeding) 05/17/2017  . GI bleed 05/05/2017  . Pelvic fracture ([place]) 04/26/2017  . Protein-calorie malnutrition, severe 01/07/2017  . CHF (congestive heart failure) ([place]) 01/03/2017  . UTI (urinary tract infection) 01/03/2017  . Acute metabolic encephalopathy 09/38/1829  . Cerebrovascular accident (CVA) due to occlusion of right middle cerebral artery ([place]) 10/24/2015  . Malnutrition of mild degree ([place]) 10/24/2015  . Altered mental status 09/18/2015  . Preventative health care 05/03/2014  . Peripheral vascular disease ([place]) 12/21/2013  . Toe amputation status ([place]) 08/29/2013  . History of recent fall 04/18/2013  . HTN (hypertension), malignant 03/22/2013  . S/P aortic valve replacement with bioprosthetic valve 11/15/2012  . S/P CABG x 3 11/15/2012  . Anemia of chronic kidney failure 12/22/2010  . Thrombocytopenia ([place]) 12/13/2010  . End stage renal disease on dialysis ([place]) 12/13/2010  . S/P aortic valve replacement 08/11/2010  . Arterial insufficiency-lower 07/25/2010  . NEURODERMATITIS 04/08/2009  . Carotid arterial disease ([place]) 01/01/2009  . Coronary atherosclerosis 02/11/2007  . AORTIC STENOSIS 10/04/2006  . ANXIETY 05/30/2006  . Pulmonary hypertension ([place]) 05/30/2006  . Hypothyroidism 05/27/2006  . Type 2 diabetes mellitus with renal manifestations, controlled ([place]) 05/27/2006  . Hyperlipidemia 05/27/2006  . Essential hypertension 05/27/2006  . GERD 05/27/2006  . OSTEOARTHRITIS 05/27/2006  . OSTEOPENIA 05/27/2006  . URINARY INCONTINENCE 05/27/2006      Prior to Admission medications   Medication Sig Start Date End Date Taking? Authorizing Provider  acetaminophen (TYLENOL) 325 MG tablet Take 1 tablet (325 mg total) by mouth every 6 (six) hours as needed for mild pain or fever (or Fever >/= 101). 04/28/17   Gouru, Aruna, MD    albuterol (PROVENTIL) (2.5 MG/3ML) 0.083% nebulizer solution Take 3 mLs (2.5 mg total) by nebulization every 2 (two) hours as needed for wheezing. 04/28/17   Nicholes Mango, MD  Amino Acids-Protein Hydrolys (FEEDING SUPPLEMENT, PRO-STAT SUGAR FREE 64,) LIQD Take 30 mLs by mouth 3 (three) times daily with meals.    [provider]  amLODipine (NORVASC) 10 MG tablet Take 1 tablet (10 mg total) by mouth daily. 05/27/17   Nicholes Mango, MD  calcium carbonate (TUMS - DOSED IN MG ELEMENTAL CALCIUM) 500 MG chewable tablet Chew 1 tablet (200 mg of elemental calcium total) by mouth 3 (three) times daily with meals. 12/06/12   Geradine Girt, DO  epoetin alfa (EPOGEN,PROCRIT) 93716 UNIT/ML injection Inject 1 mL (10,000 Units total) into the vein every Monday, Wednesday, and Friday with hemodialysis. 05/24/17   Nicholes Mango, MD  feeding supplement, ENSURE ENLIVE, (ENSURE ENLIVE)  LIQD Take 237 mLs by mouth 3 (three) times daily between meals. 01/08/17   Fritzi Mandes, MD  folic acid (FOLVITE) 0.5 MG tablet Take 0.5 tablets (0.5 mg total) by mouth daily. 12/06/12   Geradine Girt, DO  Ganciclovir (ZIRGAN) 0.15 % GEL Place 1 drop into the right eye 4 (four) times daily. 01/08/17   Fritzi Mandes, MD  hydrALAZINE (APRESOLINE) 25 MG tablet Take 25 mg by mouth 3 (three) times daily.    [provider]  HYDROcodone-acetaminophen (NORCO/VICODIN) 5-325 MG tablet Take 1-2 tablets by mouth every 4 (four) hours as needed for moderate pain. 05/24/17   Gouru, Illene Silver, MD  insulin aspart (NOVOLOG) 100 UNIT/ML injection Inject 0-9 Units into the skin 3 (three) times daily with meals. CBG < 70: implement hypoglycemia protocol CBG 70 - 120: 0 units CBG 121 - 150: 1 unit CBG 151 - 200: 2 units CBG 201 - 250: 3 units CBG 251 - 300: 5 units CBG 301 - 350: 7 units CBG 351 - 400: 9 units CBG > 400: call MD and obtain STAT lab verification 05/24/17   Nicholes Mango, MD  levothyroxine (SYNTHROID, LEVOTHROID) 75 MCG tablet TAKE ONE  TABLET BY MOUTH ONCE DAILY 07/07/16   Viviana Simpler I, MD  metoprolol tartrate (LOPRESSOR) 50 MG tablet TAKE ONE TABLET BY MOUTH TWICE DAILY 02/05/17   Minna Merritts, MD  multivitamin (RENA-VIT) TABS tablet Take 1 tablet by mouth at bedtime. 01/08/17   Fritzi Mandes, MD  ondansetron (ZOFRAN) 4 MG tablet Take 1 tablet (4 mg total) by mouth every 6 (six) hours as needed for nausea. 04/28/17   Nicholes Mango, MD  pantoprazole (PROTONIX) 40 MG tablet Take 1 tablet (40 mg total) by mouth 2 (two) times daily before a meal. 05/24/17   Gouru, Aruna, MD  polyethylene glycol (MIRALAX / GLYCOLAX) packet Take 17 g by mouth daily as needed for mild constipation. 05/24/17   Nicholes Mango, MD  polyvinyl alcohol (LIQUIFILM TEARS) 1.4 % ophthalmic solution Place 1 drop into both eyes as needed for dry eyes. 09/22/15   Fritzi Mandes, MD  pravastatin (PRAVACHOL) 40 MG tablet TAKE ONE TABLET BY MOUTH IN THE EVENING 07/21/16   Gollan, Kathlene November, MD  senna-docusate (SENOKOT-S) 8.6-50 MG tablet Take 1 tablet by mouth at bedtime as needed for mild constipation. 04/28/17   Nicholes Mango, MD  vitamin C (VITAMIN C) 500 MG tablet Take 1 tablet (500 mg total) by mouth daily. Patient taking differently: Take 500 mg by mouth 2 (two) times daily.  12/06/12   Geradine Girt, DO  zinc sulfate 220 (50 Zn) MG capsule Take 220 mg by mouth daily.    [provider]    Allergies Nitrofurantoin; Captopril; Enalapril maleate; Ramipril; Sulfa antibiotics; and Verapamil    Social History Social History   Tobacco Use  . Smoking status: Former Smoker    Years: 15.00    Types: Cigarettes    Last attempt to quit: 01/06/1943    Years since quitting: 74.5  . Smokeless tobacco: Never Used  Substance Use Topics  . Alcohol use: Yes    Comment: Wine occasionally  . Drug use: No    Review of Systems Patient denies headaches, rhinorrhea, blurry vision, numbness, shortness of breath, chest pain, edema, cough, abdominal pain, nausea, vomiting,  diarrhea, dysuria, fevers, rashes or hallucinations unless otherwise stated above in HPI. ____________________________________________   PHYSICAL EXAM:  VITAL SIGNS: Vitals:   06/27/17 1510 06/27/17 1530  BP: (!) 177/57 Marland Kitchen)  171/56  Pulse: 92 88  Resp:  (!) 33  Temp:    SpO2: 98% 98%    Constitutional: Alert and oriented, frail and ill appearing in mild resp distress  Eyes: Conjunctivae are normal.  Head: Atraumatic. Nose: No congestion/rhinnorhea. Mouth/Throat: Mucous membranes are moist.   Neck: No stridor. Painless ROM.  Cardiovascular: Normal rate, regular rhythm. Grossly normal heart sounds.  Good peripheral circulation. Respiratory: tachypnea with USE of accessory muscles with inspiratory crackles and rhonchi in right lung fields,  Clear with basilar crackles on left lung base Gastrointestinal: Soft and nontender. No distention. No abdominal bruits. No CVA tenderness. Genitourinary: deferred Musculoskeletal: No lower extremity tenderness nor edema.  No joint effusions. Neurologic:  Normal speech and language. No gross focal neurologic deficits are appreciated. No facial droop Skin:  Skin is warm, dry and intact. No rash noted. Psychiatric: appropriate  ____________________________________________   LABS (all labs ordered are listed, but only abnormal results are displayed)  Results for orders placed or performed during the hospital encounter of 06/27/17 (from the past 24 hour(s))  Lactic acid, plasma     Status: None   Collection Time: 06/27/17  1:57 PM  Result Value Ref Range   Lactic Acid, Venous 1.2 0.5 - 1.9 mmol/L  Comprehensive metabolic panel     Status: Abnormal   Collection Time: 06/27/17  1:57 PM  Result Value Ref Range   Sodium 133 (L) 135 - 145 mmol/L   Potassium 3.9 3.5 - 5.1 mmol/L   Chloride 93 (L) 101 - 111 mmol/L   CO2 26 22 - 32 mmol/L   Glucose, Bld 119 (H) 65 - 99 mg/dL   BUN 34 (H) 6 - 20 mg/dL   Creatinine, Ser 3.99 (H) 0.44 - 1.00 mg/dL    Calcium 9.0 8.9 - 10.3 mg/dL   Total Protein 8.2 (H) 6.5 - 8.1 g/dL   Albumin 3.3 (L) 3.5 - 5.0 g/dL   AST 24 15 - 41 U/L   ALT 13 (L) 14 - 54 U/L   Alkaline Phosphatase 177 (H) 38 - 126 U/L   Total Bilirubin 1.2 0.3 - 1.2 mg/dL   GFR calc non Af Amer 9 (L) >60 mL/min   GFR calc Af Amer 10 (L) >60 mL/min   Anion gap 14 5 - 15  Troponin I     Status: Abnormal   Collection Time: 06/27/17  1:57 PM  Result Value Ref Range   Troponin I 0.04 (HH) <0.03 ng/mL  CBC WITH DIFFERENTIAL     Status: Abnormal   Collection Time: 06/27/17  1:57 PM  Result Value Ref Range   WBC 10.4 3.6 - 11.0 K/uL   RBC 3.93 3.80 - 5.20 MIL/uL   Hemoglobin 12.4 12.0 - 16.0 g/dL   HCT 37.9 35.0 - 47.0 %   MCV 96.5 80.0 - 100.0 fL   MCH 31.5 26.0 - 34.0 pg   MCHC 32.7 32.0 - 36.0 g/dL   RDW 19.3 (H) 11.5 - 14.5 %   Platelets 179 150 - 440 K/uL   Neutrophils Relative % 82 %   Neutro Abs 8.6 (H) 1.4 - 6.5 K/uL   Lymphocytes Relative 4 %   Lymphs Abs 0.4 (L) 1.0 - 3.6 K/uL   Monocytes Relative 10 %   Monocytes Absolute 1.0 (H) 0.2 - 0.9 K/uL   Eosinophils Relative 3 %   Eosinophils Absolute 0.3 0 - 0.7 K/uL   Basophils Relative 1 %   Basophils Absolute 0.0 0 - 0.1 K/uL  Brain natriuretic peptide     Status: Abnormal   Collection Time: 06/27/17  1:57 PM  Result Value Ref Range   B Natriuretic Peptide 1,988.0 (H) 0.0 - 100.0 pg/mL  Urinalysis, Complete w Microscopic     Status: Abnormal   Collection Time: 06/27/17  3:05 PM  Result Value Ref Range   Color, Urine AMBER (A) YELLOW   APPearance TURBID (A) CLEAR   Specific Gravity, Urine 1.013 1.005 - 1.030   pH 6.0 5.0 - 8.0   Glucose, UA NEGATIVE NEGATIVE mg/dL   Hgb urine dipstick MODERATE (A) NEGATIVE   Bilirubin Urine NEGATIVE NEGATIVE   Ketones, ur NEGATIVE NEGATIVE mg/dL   Protein, ur 100 (A) NEGATIVE mg/dL   Nitrite NEGATIVE NEGATIVE   Leukocytes, UA MODERATE (A) NEGATIVE   RBC / HPF >50 (H) 0 - 5 RBC/hpf   WBC, UA >50 (H) 0 - 5 WBC/hpf    Bacteria, UA MANY (A) NONE SEEN   Squamous Epithelial / LPF 21-50 0 - 5   WBC Clumps PRESENT    Mucus PRESENT    ____________________________________________ 13:46EKG My review and personal interpretation at Time: 13:46   Indication: sob  Rate: 90  Rhythm: sinus Axis: left Other: borderline proloonged qt, nonspecific st change, no stemi ____________________________________________  RADIOLOGY  I personally reviewed all radiographic images ordered to evaluate for the above acute complaints and reviewed radiology reports and findings.  These findings were personally discussed with the patient.  Please see medical record for radiology report.  ____________________________________________   PROCEDURES  Due to difficulty with obtaining IV access, a 20G peripheral IV catheter was inserted using US guidance into the right UE.  The site was prepped with chlorhexidine and allowed to dry.  The patient tolerated the procedure without any complications.   Procedure(s) performed:  .Critical Care Performed by: Merlyn Lot, MD Authorized by: Merlyn Lot, MD   Critical care provider statement:    Critical care time (minutes):  35   Critical care time was exclusive of:  Separately billable procedures and treating other patients   Critical care was necessary to treat or prevent imminent or life-threatening deterioration of the following conditions:  Respiratory failure   Critical care was time spent personally by me on the following activities:  Development of treatment plan with patient or surrogate, discussions with consultants, evaluation of patient's response to treatment, examination of patient, obtaining history from patient or surrogate, ordering and performing treatments and interventions, ordering and review of laboratory studies, ordering and review of radiographic studies, pulse oximetry, re-evaluation of patient's condition and review of old charts      Critical Care  performed: yes ____________________________________________   INITIAL IMPRESSION / [place] / ED COURSE  Pertinent labs & imaging results that were available during my care of the patient were reviewed by me and considered in my medical decision making (see chart for details).   DDX: Asthma, copd, CHF, pna, ptx, malignancy, Pe, anemia   [name] is a 84 y.o. who presents to the ED with weakness and symptoms as described above.  Patient found to be hypoxic to 80s with tachypnea and shallow respirations.  Does appear very weak was not febrile at this time.  Mildly hypertensive.  Chest x-ray shows improving effusion with some interstitial edema but no overt consolidation.  Blood work sent for the above differential will proceed with septic work-up though she does not meet any criteria for code sepsis at this time.  Patient placed on  supplemental oxygen for her acute hypoxic respiratory failure with improvement in her oxygenation.  Clinical Course as of Jun 27 1545  Sun Jun 27, 2017  1526 Head is normal.  No leukocytosis and patient without any fever with core temperature.  Seems this is more likely primary respiratory driven less likely sepsis.  Pneumonia still on the differential but will wait for urine remainder blood work to further evaluate.  Regardless patient will require hospitalization for her new hypoxia particular given her significant comorbidities.   [PR]    Clinical Course User Index [PR] Merlyn Lot, MD   Analysis does show evidence of urinary tract infection.  Patient started on broad-spectrum antibiotics.  Patient will require hospitalization for further medical management.  Have discussed with the patient and available family all diagnostics and treatments performed thus far and all questions were answered to the best of my ability. The patient demonstrates understanding and agreement with plan.   As part of my medical decision making, I reviewed the  following data within the [facility] notes reviewed and incorporated, Labs reviewed, notes from prior ED visits.  ____________________________________________   FINAL CLINICAL IMPRESSION(S) / ED DIAGNOSES  Final diagnoses:  Acute respiratory failure with hypoxia ([place])  Acute cystitis with hematuria      NEW MEDICATIONS STARTED DURING THIS VISIT:  New Prescriptions   No medications on file     Note:  This document was prepared using Dragon voice recognition software and may include unintentional dictation errors.    Merlyn Lot, MD 06/27/17 1547

## 2017-06-27 NOTE — Progress Notes (Signed)
Advanced Care Plan.  Purpose of Encounter: CODE STATUS Parties in Attendance: The patient, her son, her cousins, and me. Patient's Decisional Capacity: No. Medical Story: [name]  is a 84 y.o. female with a known history of multiple medical problems including ESRD on hemodialysis, hypertension, Chronic diastolic CHF, diabetes, CAD, UTI, hyperlipidemia, PAD, pulmonary hypertension and hypothyroidism.  The patient has had cough, sputum and the shortness of breath for a few days.  She is being admitted for acute respiratory failure due to pneumonia and pulmonary edema.  She also has UTI.  I discussed the patient conditions, poor prognosis and CODE STATUS.  Per patient's son, the patient is DNR status.  But the patient is very strong woman, she wants any treatment or procedures if necessary.  Plan:  Code Status: DNR. Time spent discussing advance care planning: 18 minutes.

## 2017-06-27 NOTE — H&P (Signed)
[facility] NAME: [name]    MR#:  [id number]  DATE OF BIRTH:  [birth year]  DATE OF ADMISSION:  06/27/2017  PRIMARY CARE PHYSICIAN: [name], MD   REQUESTING/REFERRING PHYSICIAN: [name]  CHIEF COMPLAINT:   Chief Complaint  Patient presents with  . Nasal Congestion  . Weakness   Cough with sputum and generalized weakness for several days. HISTORY OF PRESENT ILLNESS:  [name]  is a 84 y.o. female with a known history of multiple medical problems including ESRD on hemodialysis, hypertension, Chronic diastolic CHF, diabetes, CAD, UTI, hyperlipidemia, PAD, pulmonary hypertension and hypothyroidism.  The patient has had cough, sputum and the shortness of breath for a few days.  In addition, she has generalized weakness.  [facility].  She is found hypoxia and put on oxygen by nasal cannula 2 L in the ED.  Chest x-ray show pulmonary congestion and possible pneumonia.  Urinalysis showed UTI.  The patient is very hard of hearing.  Information is obtained from her son.  PAST MEDICAL HISTORY:   Past Medical History:  Diagnosis Date  . Anemia    NOS  . Anxiety   . Aortic stenosis   . Cancer [facility])    Mole on top of head to be removed in July 2013  . Chronic diastolic CHF (congestive heart failure) ([place])    a. 01/2017 Echo: EF 60-65%, no rwma, Gr2 DD, mild to mod MR, mildly dil LA/RA, nl RV fxn, Sev TR, PASP 16mmHg.  . Constipation   . Coronary artery disease    a. 2009 CABG x 3 w/ bioprosthetic AVR.  . Diabetes mellitus    type II;was on Glipizide but has been off x 26mon  . Dialysis patient ([place])   . Diarrhea   . ESRD (end stage renal disease) ([place])   . GERD (gastroesophageal reflux disease)   . History of blood transfusion   . Hyperlipidemia    takes Simvastatin nightly  . Hypertension    takes Metoprolol daily  . Hypothyroidism    takes Synthroid daily  .  Migraine    hx of  . Oligouria   . Osteoarthritis   . Osteopenia   . Peripheral vascular disease ([place])   . Personal history of colonic polyps   . Pulmonary hypertension ([place])   . Urinary incontinence   . UTI (urinary tract infection)     PAST SURGICAL HISTORY:   Past Surgical History:  Procedure Laterality Date  . A/V FISTULAGRAM [name] 01/07/2017   Procedure: A/V Fistulagram;  Surgeon: Algernon Huxley, MD;  Location: [place] CV LAB;  Service: Cardiovascular;  Laterality: [name];  . A/V SHUNT INTERVENTION [name] 01/07/2017   Procedure: A/V SHUNT INTERVENTION;  Surgeon: Algernon Huxley, MD;  Location: [place] CV LAB;  Service: Cardiovascular;  Laterality: [name];  . ABDOMINAL HYSTERECTOMY    . adenosine myoview  2007   benign, EF 69%  . AMPUTATION  06/18/2011   Procedure: AMPUTATION DIGIT;  Surgeon: Newt Minion, MD;  Location: [place];  Service: Orthopedics;  Laterality: Left;  Left 2nd Toe Amputation at MTP Joint  . Amputation-left great toe  7/12   Dr Marlou Sa  . AORTIC VALVE REPLACEMENT  2009  . APPENDECTOMY    . AV FISTULA PLACEMENT  12/22/2010   Procedure: ARTERIOVENOUS (AV) FISTULA CREATION;  Surgeon: Hinda Lenis, MD;  Location: [facility] [place];  Service: Vascular;  Laterality: Left;  Creation of Left Brachial-Cephalic Fistula  . CARDIAC CATHETERIZATION  2000   cad  . CAROTID ENDARTERECTOMY  2011   Right  . CATARACT EXTRACTION    . COLONOSCOPY  05/21/2017   Procedure: COLONOSCOPY;  Surgeon: Toledo, Benay Pike, MD;  Location: [facility] ENDOSCOPY;  Service: Gastroenterology;;  . CORONARY ARTERY BYPASS GRAFT     od  . CORONARY ARTERY BYPASS GRAFT  2009  . ESOPHAGOGASTRODUODENOSCOPY [name] 05/21/2017   Procedure: ESOPHAGOGASTRODUODENOSCOPY (EGD);  Surgeon: Toledo, Benay Pike, MD;  Location: [facility] ENDOSCOPY;  Service: Gastroenterology;  Laterality: [name];  . EYE SURGERY     BIlateral  . INSERTION OF DIALYSIS CATHETER  12/18/2010   Procedure: INSERTION OF DIALYSIS CATHETER;  Surgeon: Mal Misty, MD;   Location: [place];  Service: Vascular;  Laterality: Right;  . REPLACEMENT TOTAL KNEE BILATERAL  05/1998  . TONSILLECTOMY    . Traumatic Amputation of Right DIP joint of Index Finger      SOCIAL HISTORY:   Social History   Tobacco Use  . Smoking status: Former Smoker    Years: 15.00    Types: Cigarettes    Last attempt to quit: 01/06/1943    Years since quitting: 74.5  . Smokeless tobacco: Never Used  Substance Use Topics  . Alcohol use: Yes    Comment: Wine occasionally    FAMILY HISTORY:   Family History  Family history unknown: Yes  Unknown.  DRUG ALLERGIES:   Allergies  Allergen Reactions  . Nitrofurantoin Itching  . Ace Inhibitors   . Captopril Other (See Comments)    REACTION: unspecified  . Enalapril Maleate Cough  . Ramipril Other (See Comments)    REACTION: unspecified  . Sulfa Antibiotics Other (See Comments)    Reaction unknown  . Verapamil Other (See Comments)    REACTION: unspecified    REVIEW OF SYSTEMS:   Review of Systems  Unable to perform ROS: Age  Patient is very hard of hearing and unable to get ROS.  MEDICATIONS AT HOME:   Prior to Admission medications   Medication Sig Start Date End Date Taking? Authorizing Provider  acetaminophen (TYLENOL) 325 MG tablet Take 1 tablet (325 mg total) by mouth every 6 (six) hours as needed for mild pain or fever (or Fever >/= 101). 04/28/17   Gouru, Aruna, MD  albuterol (PROVENTIL) (2.5 MG/3ML) 0.083% nebulizer solution Take 3 mLs (2.5 mg total) by nebulization every 2 (two) hours as needed for wheezing. 04/28/17   Nicholes Mango, MD  Amino Acids-Protein Hydrolys (FEEDING SUPPLEMENT, PRO-STAT SUGAR FREE 64,) LIQD Take 30 mLs by mouth 3 (three) times daily with meals.    [provider]  amLODipine (NORVASC) 10 MG tablet Take 1 tablet (10 mg total) by mouth daily. 05/27/17   Nicholes Mango, MD  calcium carbonate (TUMS - DOSED IN MG ELEMENTAL CALCIUM) 500 MG chewable tablet Chew 1 tablet (200 mg of elemental  calcium total) by mouth 3 (three) times daily with meals. 12/06/12   Geradine Girt, DO  epoetin alfa (EPOGEN,PROCRIT) 41937 UNIT/ML injection Inject 1 mL (10,000 Units total) into the vein every Monday, Wednesday, and Friday with hemodialysis. 05/24/17   Gouru, Illene Silver, MD  feeding supplement, ENSURE ENLIVE, (ENSURE ENLIVE) LIQD Take 237 mLs by mouth 3 (three) times daily between meals. 01/08/17   Fritzi Mandes, MD  folic acid (FOLVITE) 0.5 MG tablet Take 0.5 tablets (0.5 mg total) by mouth daily. 12/06/12   Geradine Girt, DO  Ganciclovir (  ZIRGAN) 0.15 % GEL Place 1 drop into the right eye 4 (four) times daily. 01/08/17   Fritzi Mandes, MD  hydrALAZINE (APRESOLINE) 25 MG tablet Take 25 mg by mouth 3 (three) times daily.    [provider]  HYDROcodone-acetaminophen (NORCO/VICODIN) 5-325 MG tablet Take 1-2 tablets by mouth every 4 (four) hours as needed for moderate pain. 05/24/17   Gouru, Illene Silver, MD  insulin aspart (NOVOLOG) 100 UNIT/ML injection Inject 0-9 Units into the skin 3 (three) times daily with meals. CBG < 70: implement hypoglycemia protocol CBG 70 - 120: 0 units CBG 121 - 150: 1 unit CBG 151 - 200: 2 units CBG 201 - 250: 3 units CBG 251 - 300: 5 units CBG 301 - 350: 7 units CBG 351 - 400: 9 units CBG > 400: call MD and obtain STAT lab verification 05/24/17   Nicholes Mango, MD  levothyroxine (SYNTHROID, LEVOTHROID) 75 MCG tablet TAKE ONE TABLET BY MOUTH ONCE DAILY 07/07/16   Viviana Simpler I, MD  metoprolol tartrate (LOPRESSOR) 50 MG tablet TAKE ONE TABLET BY MOUTH TWICE DAILY 02/05/17   Minna Merritts, MD  multivitamin (RENA-VIT) TABS tablet Take 1 tablet by mouth at bedtime. 01/08/17   Fritzi Mandes, MD  ondansetron (ZOFRAN) 4 MG tablet Take 1 tablet (4 mg total) by mouth every 6 (six) hours as needed for nausea. 04/28/17   Nicholes Mango, MD  pantoprazole (PROTONIX) 40 MG tablet Take 1 tablet (40 mg total) by mouth 2 (two) times daily before a meal. 05/24/17   Gouru, Aruna, MD  polyethylene  glycol (MIRALAX / GLYCOLAX) packet Take 17 g by mouth daily as needed for mild constipation. 05/24/17   Nicholes Mango, MD  polyvinyl alcohol (LIQUIFILM TEARS) 1.4 % ophthalmic solution Place 1 drop into both eyes as needed for dry eyes. 09/22/15   Fritzi Mandes, MD  pravastatin (PRAVACHOL) 40 MG tablet TAKE ONE TABLET BY MOUTH IN THE EVENING 07/21/16   Gollan, Kathlene November, MD  senna-docusate (SENOKOT-S) 8.6-50 MG tablet Take 1 tablet by mouth at bedtime as needed for mild constipation. 04/28/17   Nicholes Mango, MD  vitamin C (VITAMIN C) 500 MG tablet Take 1 tablet (500 mg total) by mouth daily. Patient taking differently: Take 500 mg by mouth 2 (two) times daily.  12/06/12   Geradine Girt, DO  zinc sulfate 220 (50 Zn) MG capsule Take 220 mg by mouth daily.    [provider]      VITAL SIGNS:  Blood pressure (!) 171/56, pulse 88, temperature 98.9 F (37.2 C), temperature source Oral, resp. rate (!) 33, height 5\' 2"  (1.575 m), weight 97 lb (44 kg), SpO2 98 %.  PHYSICAL EXAMINATION:  Physical Exam  GENERAL:  84 y.o.-year-old patient lying in the bed with no acute distress.  Looks fragile. EYES: Pupils equal, round, reactive to light and accommodation. No scleral icterus. Extraocular muscles intact.  HEENT: Head atraumatic, normocephalic. Oropharynx and nasopharynx clear.  NECK:  Supple, no jugular venous distention. No thyroid enlargement, no tenderness.  LUNGS: Bilateral crackles but no wheezing. No use of accessory muscles of respiration.  CARDIOVASCULAR: S1, S2 normal. No murmurs, rubs, or gallops.  ABDOMEN: Soft, nontender, nondistended. Bowel sounds present. No organomegaly or mass.  EXTREMITIES: No pedal edema, cyanosis, or clubbing.  NEUROLOGIC: Cranial nerves II through XII are intact. Muscle strength 4/5 in all extremities. Sensation intact. Gait not checked.  PSYCHIATRIC: The patient is alert and oriented x 3.  SKIN: No obvious rash, lesion, or ulcer.  LABORATORY PANEL:    CBC Recent Labs  Lab 06/27/17 1357  WBC 10.4  HGB 12.4  HCT 37.9  PLT 179   ------------------------------------------------------------------------------------------------------------------  Chemistries  Recent Labs  Lab 06/27/17 1357  NA 133*  K 3.9  CL 93*  CO2 26  GLUCOSE 119*  BUN 34*  CREATININE 3.99*  CALCIUM 9.0  AST 24  ALT 13*  ALKPHOS 177*  BILITOT 1.2   ------------------------------------------------------------------------------------------------------------------  Cardiac Enzymes Recent Labs  Lab 06/27/17 1357  TROPONINI 0.04*   ------------------------------------------------------------------------------------------------------------------  RADIOLOGY:  Dg Chest Port 1 View  Result Date: 06/27/2017 CLINICAL DATA:  84 year old female with history of productive cough with yellow mucus. Wheezing and coarse breath sounds. EXAM: PORTABLE CHEST 1 VIEW COMPARISON:  Chest x-ray 05/22/2017. FINDINGS: Decreasing moderate right pleural effusion. No left pleural effusion. Atelectasis in the base of the right lung. No definite consolidative airspace disease. Cephalization of the pulmonary vasculature with slight indistinctness of interstitial markings. Mild cardiomegaly. The patient is rotated to the left on today's exam, resulting in distortion of the mediastinal contours and reduced diagnostic sensitivity and specificity for mediastinal pathology. Atherosclerosis in the thoracic aorta. Status post median sternotomy for CABG. Calcifications in the apices of the lungs bilaterally. IMPRESSION: 1. Decreasing moderate right pleural effusion with some basilar atelectasis in the right lung. 2. Cardiomegaly with evidence of mild interstitial pulmonary edema, which may suggest congestive heart failure. 3. Aortic atherosclerosis. Electronically Signed   By: Vinnie Langton M.D.   On: 06/27/2017 14:45      IMPRESSION AND PLAN:   Acute respiratory failure with hypoxia  due to pneumonia and pulmonary edema. The patient will be admitted to medical floor. She is treated with antibiotics in the ED, continue cefepime and vancomycin pharmacy to dose.  Robitussin as needed, DuoNeb as needed.  Pulmonary edema with history of ESRD on hemodialysis. Nephrology consult for hemodialysis.  Elevated troponin, due to demanding ischemia secondary to above.  Follow-up troponin level.  Diabetes.  Start sliding scale. Hypertension.  Continue home hypertension medication.   All the records are reviewed and case discussed with ED provider. Management plans discussed with the patient, her son and they are in agreement.  CODE STATUS: DNR  TOTAL TIME TAKING CARE OF THIS PATIENT: 43 minutes.    Demetrios Loll M.D on 06/27/2017 at 4:17 PM  Between 7am to 6pm - Pager - [phone]  After 6pm go to [email or web address] - Proofreader  [facility]  Office  [phone]  CC: Primary care physician; [name], MD   Note: This dictation was prepared with Dragon dictation along with smaller phrase technology. Any transcriptional errors that result from this process are unin

## 2017-06-27 NOTE — ED Triage Notes (Signed)
Patient presents to the ED with productive cough with yellow mucous.  Patient is weaker than usual per son.  Patient has some wheezing and coarse breath sounds.  Patient is alert and oriented x 4.

## 2017-06-28 ENCOUNTER — Encounter: Payer: Self-pay | Admitting: *Deleted

## 2017-06-28 ENCOUNTER — Ambulatory Visit: Payer: Self-pay | Admitting: *Deleted

## 2017-06-28 ENCOUNTER — Other Ambulatory Visit: Payer: Self-pay | Admitting: *Deleted

## 2017-06-28 LAB — RENAL FUNCTION PANEL
Albumin: 2.7 g/dL — ABNORMAL LOW (ref 3.5–5.0)
Anion gap: 14 (ref 5–15)
BUN: 41 mg/dL — ABNORMAL HIGH (ref 6–20)
CALCIUM: 8.2 mg/dL — AB (ref 8.9–10.3)
CHLORIDE: 95 mmol/L — AB (ref 101–111)
CO2: 23 mmol/L (ref 22–32)
CREATININE: 4.79 mg/dL — AB (ref 0.44–1.00)
GFR calc non Af Amer: 7 mL/min — ABNORMAL LOW (ref >60)
GFR, EST AFRICAN AMERICAN: 8 mL/min — AB (ref >60)
GLUCOSE: 116 mg/dL — AB (ref 65–99)
Phosphorus: 5.1 mg/dL — ABNORMAL HIGH (ref 2.5–4.6)
Potassium: 4.2 mmol/L (ref 3.5–5.1)
SODIUM: 132 mmol/L — AB (ref 135–145)

## 2017-06-28 LAB — CBC
HCT: 31.7 % — ABNORMAL LOW (ref 35.0–47.0)
HEMATOCRIT: 32.5 % — AB (ref 35.0–47.0)
HEMOGLOBIN: 10.6 g/dL — AB (ref 12.0–16.0)
HEMOGLOBIN: 10.7 g/dL — AB (ref 12.0–16.0)
MCH: 32.2 pg (ref 26.0–34.0)
MCH: 31.8 pg (ref 26.0–34.0)
MCHC: 33.4 g/dL (ref 32.0–36.0)
MCHC: 33 g/dL (ref 32.0–36.0)
MCV: 96.3 fL (ref 80.0–100.0)
MCV: 96.4 fL (ref 80.0–100.0)
Platelets: 147 10*3/uL — ABNORMAL LOW (ref 150–440)
Platelets: 154 10*3/uL (ref 150–440)
RBC: 3.29 MIL/uL — ABNORMAL LOW (ref 3.80–5.20)
RBC: 3.37 MIL/uL — AB (ref 3.80–5.20)
RDW: 18.8 % — AB (ref 11.5–14.5)
RDW: 19.1 % — ABNORMAL HIGH (ref 11.5–14.5)
WBC: 7.5 10*3/uL (ref 3.6–11.0)
WBC: 7.3 10*3/uL (ref 3.6–11.0)

## 2017-06-28 LAB — BASIC METABOLIC PANEL
ANION GAP: 12 (ref 5–15)
BUN: 39 mg/dL — ABNORMAL HIGH (ref 6–20)
CALCIUM: 8.2 mg/dL — AB (ref 8.9–10.3)
CO2: 25 mmol/L (ref 22–32)
Chloride: 97 mmol/L — ABNORMAL LOW (ref 101–111)
Creatinine, Ser: 4.5 mg/dL — ABNORMAL HIGH (ref 0.44–1.00)
GFR calc Af Amer: 9 mL/min — ABNORMAL LOW (ref >60)
GFR, EST NON AFRICAN AMERICAN: 8 mL/min — AB (ref >60)
GLUCOSE: 80 mg/dL (ref 65–99)
Potassium: 4.2 mmol/L (ref 3.5–5.1)
SODIUM: 134 mmol/L — AB (ref 135–145)

## 2017-06-28 LAB — GLUCOSE, CAPILLARY
GLUCOSE-CAPILLARY: 66 mg/dL (ref 65–99)
Glucose-Capillary: 102 mg/dL — ABNORMAL HIGH (ref 65–99)
Glucose-Capillary: 124 mg/dL — ABNORMAL HIGH (ref 65–99)
Glucose-Capillary: 140 mg/dL — ABNORMAL HIGH (ref 65–99)

## 2017-06-28 LAB — DIFFERENTIAL
Basophils Absolute: 0.1 10*3/uL (ref 0–0.1)
Basophils Relative: 1 %
EOS ABS: 0.1 10*3/uL (ref 0–0.7)
EOS PCT: 2 %
LYMPHS ABS: 0.2 10*3/uL — AB (ref 1.0–3.6)
LYMPHS PCT: 2 %
MONO ABS: 0.9 10*3/uL (ref 0.2–0.9)
MONOS PCT: 13 %
Neutro Abs: 6 10*3/uL (ref 1.4–6.5)
Neutrophils Relative %: 82 %

## 2017-06-28 LAB — PREALBUMIN: Prealbumin: 11.6 mg/dL — ABNORMAL LOW (ref 18–38)

## 2017-06-28 MED ORDER — ENSURE ENLIVE PO LIQD
237.0000 mL | Freq: Three times a day (TID) | ORAL | Status: DC
  Administered 2017-06-28 – 2017-07-07 (×16): 237 mL via ORAL

## 2017-06-28 MED ORDER — CHLORHEXIDINE GLUCONATE CLOTH 2 % EX PADS: 6.0000 | MEDICATED_PAD | Freq: Every day | CUTANEOUS | Status: DC

## 2017-06-28 MED ORDER — VANCOMYCIN HCL 500 MG IV SOLR
500.0000 mg | Freq: Once | INTRAVENOUS | Status: AC
  Administered 2017-06-28: 500 mg via INTRAVENOUS
  Filled 2017-06-28: qty 500

## 2017-06-28 MED ORDER — RENA-VITE PO TABS
1.0000 | ORAL_TABLET | Freq: Every day | ORAL | Status: DC
  Administered 2017-06-28 – 2017-07-07 (×7): 1 via ORAL
  Filled 2017-06-28 (×9): qty 1

## 2017-06-28 NOTE — Progress Notes (Signed)
Post HD Tx    06/28/17 1409  Vital Signs  Temp Source Oral  Pulse Rate 90  Pulse Rate Source Monitor  BP (!) 170/52  BP Location Right Arm  BP Method Automatic  Patient Position (if appropriate) Lying  Oxygen Therapy  SpO2 100 %  O2 Device Nasal Cannula  O2 Flow Rate (L/min) 2 L/min  Pain Assessment  Pain Scale 0-10  Pain Score 0  Dialysis Weight  Weight 44 kg (97 lb)  Type of Weight Post-Dialysis  Post-Hemodialysis Assessment  Rinseback Volume (mL) 250 mL  KECN 60.1 V  Dialyzer Clearance Lightly streaked  Duration of HD Treatment -hour(s) 3 hour(s)  Hemodialysis Intake (mL) 500 mL  UF Total -Machine (mL) 1000 mL  Net UF (mL) 500 mL  Tolerated HD Treatment Yes  AVG/AVF Arterial Site Held (minutes) 10 minutes  AVG/AVF Venous Site Held (minutes) 10 minutes  Education / Care Plan  Dialysis Education Provided Yes  Documented Education in Care Plan Yes  Fistula / Graft Left Upper arm Arteriovenous fistula  No Placement Date or Time found.   Orientation: Left  Access Location: Upper arm  Access Type: Arteriovenous fistula  Site Condition No complications  Fistula / Graft Assessment Bruit;Thrill;Present  Status Deaccessed  Drainage Description None

## 2017-06-28 NOTE — Progress Notes (Signed)
Pre HD Tx    06/28/17 1104  Vital Signs  Temp 98.4 F (36.9 C)  Temp Source Oral  Pulse Rate 89  Pulse Rate Source Monitor  Resp 19  BP (!) 141/52  BP Location Right Arm  BP Method Automatic  Patient Position (if appropriate) Lying  Oxygen Therapy  SpO2 98 %  O2 Device Nasal Cannula  O2 Flow Rate (L/min) 3 L/min  Pulse Oximetry Type Continuous  Pain Assessment  Pain Scale 0-10  Pain Score 0  Dialysis Weight  Weight 44.5 kg (98 lb 1.7 oz)  Type of Weight Pre-Dialysis  Time-Out for Hemodialysis  What Procedure? HD   Pt Identifiers(min of two) First/Last Name;MRN/Account#  Correct Site? Yes  Correct Side? Yes  Correct Procedure? Yes  Consents Verified? Yes  Rad Studies Available? Yes  [employer] Number [id number]  Station Number 1  UF/Alarm Test Passed  Conductivity: Meter 14  Conductivity: Machine  14  pH 7.4  Reverse Osmosis Main  Normal Saline Lot Number [id number]  Dialyzer Lot Number [id number]  Disposable Set Lot Number [id number]  Machine Temperature 98.6 F (37 C)  Musician and Audible Yes  Blood Lines Intact and Secured Yes  Pre Treatment Patient Checks  Vascular access used during treatment Fistula  Hepatitis B Surface Antigen Results Negative  Date Hepatitis B Surface Antigen Drawn 01/03/18  Hepatitis B Surface Antibody  (>10)  Date Hepatitis B Surface Antibody Drawn 01/03/17  Hemodialysis Consent Verified Yes  Hemodialysis Standing Orders Initiated Yes  ECG (Telemetry) Monitor On Yes  Prime Ordered Normal Saline  Length of  DialysisTreatment -hour(s) 3 Hour(s)  Dialysis Treatment Comments Na 140  Dialyzer Elisio 17H NR  Dialysate 3K, 2.5 Ca  Dialysis Anticoagulant None  Dialysate Flow Ordered 600  Blood Flow Rate Ordered 400 mL/min  Ultrafiltration Goal 0.5 Liters  Pre Treatment Labs CBC;Renal panel  Dialysis Blood Pressure Support Ordered Normal Saline  Fistula / Graft Left Upper arm Arteriovenous fistula  No Placement Date or  Time found.   Orientation: Left  Access Location: Upper arm  Access Type: Arteriovenous fistula  Site Condition No complications  Fistula / Graft Assessment Bruit;Thrill;Present  Status Patent

## 2017-06-28 NOTE — Progress Notes (Signed)
Initial Nutrition Assessment  DOCUMENTATION CODES:   Severe malnutrition in context of chronic illness  INTERVENTION:   Recommend discontinue zinc as long term zinc supplementation can decrease copper levels   Refeed risk; recommend monitor K, Mg, and P labs   Ensure Enlive po TID, each supplement provides 350 kcal and 20 grams of protein  Magic cup TID with meals, each supplement provides 290 kcal and 9 grams of protein  Rena-vite daily  Vitamin C 500mg  po BID  Liberalize diet   NUTRITION DIAGNOSIS:   Severe Malnutrition related to chronic illness(ESRD on HD) as evidenced by moderate fat depletion, severe muscle depletion.  GOAL:   Patient will meet greater than or equal to 90% of their needs  MONITOR:   PO intake, Supplement acceptance, Labs, Weight trends, Skin, I & O's  REASON FOR ASSESSMENT:   Consult Assessment of nutrition requirement/status  ASSESSMENT:   84 y/o female with ESRD on HD, DM, admitted with cute respiratory failure with hypoxia due to pneumonia and pulmonary edema Pt with L AVF  RD familiar with this pt from multiple previous admits. Pt is a poor historian at baseline. Pt with chronic poor appetite and oral intake. Pt does like chocolate Ensure. Pt seen by SLP on her last admit 1 month ago and approved for a dysphagia 2 diet. Pt has been suspected of having scurvy for a long time now r/t her chronic poor appetite and HD. Pt noted to have generalized ecchymosis. Her UBW fluctuates between 90-105lbs; per chart, pt is weight stable. RD will order supplements and downgrade diet. Recommend discontinue zinc as long term supplementation can deplete copper. Recommend continue vitamin C. Pt documented to have eaten 75% of her breakfast this morning. Pt is at high risk for refeeding syndrome giving her chronic poor appetite; recommend monitor K, Mg, and P labs.   Medications reviewed and include: tums, folic acid, heparin, insulin, synthroid, zinc, cefepime,  vancomycin   Labs reviewed: Na 134(L), Cl 97(L), BUN 39(H), creat 8.2(L) BNP- 1988(H)- 6/23  NUTRITION - FOCUSED PHYSICAL EXAM:    Most Recent Value  Orbital Region  Mild depletion  Upper Arm Region  Moderate depletion  Thoracic and Lumbar Region  Moderate depletion  Buccal Region  Mild depletion  Temple Region  Moderate depletion  Clavicle Bone Region  Severe depletion  Clavicle and Acromion Bone Region  Severe depletion  Scapular Bone Region  Moderate depletion  Dorsal Hand  Severe depletion  Patellar Region  Severe depletion  Anterior Thigh Region  Severe depletion  Posterior Calf Region  Severe depletion  Edema (RD Assessment)  None  Hair  Reviewed  Eyes  Reviewed  Mouth  Reviewed  Skin  Reviewed- ecchymosis   Nails  Reviewed     Diet Order:   Diet Order           Diet renal/carb modified with fluid restriction Diet-HS Snack? Nothing; Fluid restriction: 1200 mL Fluid; Room service appropriate? Yes; Fluid consistency: Thin  Diet effective now         EDUCATION NEEDS:   No education needs have been identified at this time  Skin:  Skin Assessment: Reviewed RN Assessment(ecchymosis )  Last BM:  6/24- type 6  Height:   Ht Readings from Last 1 Encounters:  06/27/17 5\' 2"  (1.575 m)    Weight:   Wt Readings from Last 1 Encounters:  06/27/17 97 lb (44 kg)    Ideal Body Weight:  50 kg  BMI:  Body mass index  is 17.74 kg/m.  Estimated Nutritional Needs:   Kcal:  1200-1400kcal/day   Protein:  70-79g/day   Fluid:  per MD   Koleen Distance MS, RD, LDN Pager #- [phone] Office#- [phone] After Hours Pager: [phone]

## 2017-06-28 NOTE — Progress Notes (Addendum)
[facility] NAME: [name]    MR#:  [id number]  DATE OF BIRTH:  05-13-1920  SUBJECTIVE:  CHIEF COMPLAINT:   Chief Complaint  Patient presents with  . Nasal Congestion  . Weakness  Patient without complaint, patient with odd behavior noted, for hemodialysis later today, [facility]-was at home for short period of time before becoming symptomatic, asked dietary to see, palliative care given advanced age and multiple comorbidities, case discussed with nephrology  REVIEW OF SYSTEMS:  CONSTITUTIONAL: No fever, fatigue or weakness.  EYES: No blurred or double vision.  EARS, NOSE, AND THROAT: No tinnitus or ear pain.  RESPIRATORY: No cough, shortness of breath, wheezing or hemoptysis.  CARDIOVASCULAR: No chest pain, orthopnea, edema.  GASTROINTESTINAL: No nausea, vomiting, diarrhea or abdominal pain.  GENITOURINARY: No dysuria, hematuria.  ENDOCRINE: No polyuria, nocturia,  HEMATOLOGY: No anemia, easy bruising or bleeding SKIN: No rash or lesion. MUSCULOSKELETAL: No joint pain or arthritis.   NEUROLOGIC: No tingling, numbness, weakness.  PSYCHIATRY: No anxiety or depression.   ROS  DRUG ALLERGIES:   Allergies  Allergen Reactions  . Nitrofurantoin Itching  . Ace Inhibitors   . Captopril Other (See Comments)    REACTION: unspecified  . Enalapril Maleate Cough  . Ramipril Other (See Comments)    REACTION: unspecified  . Sulfa Antibiotics Other (See Comments)    Reaction unknown  . Verapamil Other (See Comments)    REACTION: unspecified    VITALS:  Blood pressure (!) 142/55, pulse 85, temperature 98.7 F (37.1 C), temperature source Oral, resp. rate 20, height 5\' 2"  (1.575 m), weight 44 kg (97 lb), SpO2 100 %.  PHYSICAL EXAMINATION:  GENERAL:  84 y.o.-year-old patient lying in the bed with no acute distress.  EYES: Pupils equal, round, reactive to light and accommodation.  No scleral icterus. Extraocular muscles intact.  HEENT: Head atraumatic, normocephalic. Oropharynx and nasopharynx clear.  NECK:  Supple, no jugular venous distention. No thyroid enlargement, no tenderness.  LUNGS: Normal breath sounds bilaterally, no wheezing, rales,rhonchi or crepitation. No use of accessory muscles of respiration.  CARDIOVASCULAR: S1, S2 normal. No murmurs, rubs, or gallops.  ABDOMEN: Soft, nontender, nondistended. Bowel sounds present. No organomegaly or mass.  EXTREMITIES: No pedal edema, cyanosis, or clubbing.  NEUROLOGIC: Cranial nerves II through XII are intact. Muscle strength 5/5 in all extremities. Sensation intact. Gait not checked.  PSYCHIATRIC: The patient is alert and oriented x 3.  SKIN: No obvious rash, lesion, or ulcer.   Physical Exam LABORATORY PANEL:   CBC Recent Labs  Lab 06/28/17 0418  WBC 7.5  HGB 10.6*  HCT 31.7*  PLT 147*   ------------------------------------------------------------------------------------------------------------------  Chemistries  Recent Labs  Lab 06/27/17 1357 06/28/17 0418  NA 133* 134*  K 3.9 4.2  CL 93* 97*  CO2 26 25  GLUCOSE 119* 80  BUN 34* 39*  CREATININE 3.99* 4.50*  CALCIUM 9.0 8.2*  AST 24  --   ALT 13*  --   ALKPHOS 177*  --   BILITOT 1.2  --    ------------------------------------------------------------------------------------------------------------------  Cardiac Enzymes Recent Labs  Lab 06/27/17 1357  TROPONINI 0.04*   ------------------------------------------------------------------------------------------------------------------  RADIOLOGY:  Dg Chest Port 1 View  Result Date: 06/27/2017 CLINICAL DATA:  84 year old female with history of productive cough with yellow mucus. Wheezing and coarse breath sounds. EXAM: PORTABLE CHEST 1 VIEW COMPARISON:  Chest x-ray 05/22/2017. FINDINGS: Decreasing moderate right pleural effusion. No left  pleural effusion. Atelectasis in the base of the  right lung. No definite consolidative airspace disease. Cephalization of the pulmonary vasculature with slight indistinctness of interstitial markings. Mild cardiomegaly. The patient is rotated to the left on today's exam, resulting in distortion of the mediastinal contours and reduced diagnostic sensitivity and specificity for mediastinal pathology. Atherosclerosis in the thoracic aorta. Status post median sternotomy for CABG. Calcifications in the apices of the lungs bilaterally. IMPRESSION: 1. Decreasing moderate right pleural effusion with some basilar atelectasis in the right lung. 2. Cardiomegaly with evidence of mild interstitial pulmonary edema, which may suggest congestive heart failure. 3. Aortic atherosclerosis. Electronically Signed   By: Vinnie Langton M.D.   On: 06/27/2017 14:45    ASSESSMENT AND PLAN:  *Acute respiratory failure with hypoxia due to pneumonia and pulmonary edema Most likely secondary to acute fluid overload state from end-stage renal disease/diastolic heart failure, more so than pneumonia continue empiric cefepime/vancomycin for now, supplemental oxygen with weaning as tolerated, mucolytic agents, breathing treatments as needed   *Acute fluid overload state  Secondary to end-stage renal disease  Case discussed with nephrology, given multiple comorbidities and poor prognosis going forward-we will ask palliative care to see, hemodialysis later today   *Chronic end-stage renal disease  Plan of care as stated above   *Acute probable UTI Antibiotics per above and follow-up on urine cultures  *Acute elevated troponins  Most likely secondary to chronic renal disease  Continue conservative medical management   *Chronic diabetes mellitus type 2  Sliding scale insulin with active checks per routine   *Chronic benign essential hypertension  Stable  Continue current regiment   *Chronic deconditioning, adult failure to thrive increase nursing care PRN,  aspiration/fall/skin care precautions while in house, Dietary to see, palliative care consulted, check prealbumin level   Disposition pending clinical course  All the records are reviewed and case discussed with Care Management/Social Workerr. Management plans discussed with the patient, family and they are in agreement.  CODE STATUS:dnr  TOTAL TIME TAKING CARE OF THIS PATIENT: 35 minutes.     POSSIBLE D/C IN 1-3 DAYS, DEPENDING ON CLINICAL CONDITION.   Avel Peace Artemio Dobie M.D on 06/28/2017   Between 7am to 6pm - Pager - [phone]  After 6pm go to [email or web address] - password [email or web address] [facility]  Office  [phone]  CC: Primary care physician; [name], MD  Note: This dictation was prepared with Dragon dictation along with smaller phrase technology. Any transcriptional errors that result from this process are unintentional.

## 2017-06-28 NOTE — Telephone Encounter (Signed)
That is fine 

## 2017-06-28 NOTE — Telephone Encounter (Signed)
We had an 830 and 845 open tomorrow 06-29-17. I put a hold on the 845 spot with her name on it. Hopefully that will work for them. If not, use a same day if there are any. Thanks

## 2017-06-28 NOTE — Progress Notes (Signed)
[facility]  ROUNDING NOTE   Subjective:   Seen and examined on hemodialysis. Tolerating treatment well.    Objective:  Vital signs in last 24 hours:  Temp:  [97.6 F (36.4 C)-98.7 F (37.1 C)] 98.7 F (37.1 C) (06/24 0524) Pulse Rate:  [85-94] 85 (06/24 0524) Resp:  [20-33] 20 (06/24 0524) BP: (141-177)/(48-57) 142/55 (06/24 0524) SpO2:  [97 %-100 %] 100 % (06/24 0752)  Weight change:  Filed Weights   06/27/17 1347  Weight: 44 kg (97 lb)    Intake/Output: I/O last 3 completed shifts: In: 304 [IV [id number]:785] Out: 0    Intake/Output this shift:  Total I/O In: 360 [P.O.:360] Out: -   Physical Exam: General: NAD, laying in bed  Head: Normocephalic, atraumatic. Moist oral mucosal membranes  Eyes: Anicteric, PERRL  Neck: Supple, trachea midline  Lungs:  Clear to auscultation  Heart: Regular rate and rhythm  Abdomen:  Soft, nontender,   Extremities: no peripheral edema.  Neurologic: Nonfocal, moving all four extremities  Skin: No lesions  Access: Left AVF    Basic Metabolic Panel: Recent Labs  Lab 06/27/17 1357 06/28/17 0418  NA 133* 134*  K 3.9 4.2  CL 93* 97*  CO2 26 25  GLUCOSE 119* 80  BUN 34* 39*  CREATININE 3.99* 4.50*  CALCIUM 9.0 8.2*    Liver Function Tests: Recent Labs  Lab 06/27/17 1357  AST 24  ALT 13*  ALKPHOS 177*  BILITOT 1.2  PROT 8.2*  ALBUMIN 3.3*   No results for input(s): LIPASE, AMYLASE in the last 168 hours. No results for input(s): AMMONIA in the last 168 hours.  CBC: Recent Labs  Lab 06/27/17 1357 06/28/17 0418  WBC 10.4 7.5  NEUTROABS 8.6*  --   HGB 12.4 10.6*  HCT 37.9 31.7*  MCV 96.5 96.3  PLT 179 147*    Cardiac Enzymes: Recent Labs  Lab 06/27/17 1357  TROPONINI 0.04*    BNP: Invalid input(s): POCBNP  CBG: Recent Labs  Lab 06/27/17 1743 06/27/17 2134 06/28/17 0732 06/28/17 0809  GLUCAP 126* 95 66 102*    Microbiology: Results for orders placed or performed during the  hospital encounter of 06/27/17  Blood Culture (routine x 2)     Status: None (Preliminary result)   Collection Time: 06/27/17  1:59 PM  Result Value Ref Range Status   Specimen Description BLOOD RIGHT ARM  Final   Special Requests   Final    BOTTLES DRAWN AEROBIC AND ANAEROBIC Blood Culture adequate volume   Culture   Final    NO GROWTH < 24 HOURS Performed at [facility], [address], [place], [place] [address]    Report Status PENDING  Incomplete  Blood Culture (routine x 2)     Status: None (Preliminary result)   Collection Time: 06/27/17  1:59 PM  Result Value Ref Range Status   Specimen Description BLOOD RIGHT ARM  Final   Special Requests   Final    BOTTLES DRAWN AEROBIC AND ANAEROBIC Blood Culture adequate volume   Culture   Final    NO GROWTH < 24 HOURS Performed at [facility], [address] [place], [place] [address]    Report Status PENDING  Incomplete  MRSA PCR Screening     Status: None   Collection Time: 06/27/17  4:54 PM  Result Value Ref Range Status   MRSA by PCR NEGATIVE NEGATIVE Final    Comment:        The GeneXpert  MRSA Assay (FDA approved for NASAL specimens only), is one component of a comprehensive MRSA colonization surveillance program. It is not intended to diagnose MRSA infection nor to guide or monitor treatment for MRSA infections. Performed at [facility], [address] [place], [place] [address]     Coagulation Studies: No results for input(s): LABPROT, INR in the last 72 hours.  Urinalysis: Recent Labs    06/27/17 1505  COLORURINE AMBER*  LABSPEC 1.013  PHURINE 6.0  GLUCOSEU NEGATIVE  HGBUR MODERATE*  BILIRUBINUR NEGATIVE  KETONESUR NEGATIVE  PROTEINUR 100*  NITRITE NEGATIVE  LEUKOCYTESUR MODERATE*      Imaging: Dg Chest Port 1 View  Result Date: 06/27/2017 CLINICAL DATA:  84 year old female with history of productive cough with yellow mucus. Wheezing and coarse breath sounds. EXAM:  PORTABLE CHEST 1 VIEW COMPARISON:  Chest x-ray 05/22/2017. FINDINGS: Decreasing moderate right pleural effusion. No left pleural effusion. Atelectasis in the base of the right lung. No definite consolidative airspace disease. Cephalization of the pulmonary vasculature with slight indistinctness of interstitial markings. Mild cardiomegaly. The patient is rotated to the left on today's exam, resulting in distortion of the mediastinal contours and reduced diagnostic sensitivity and specificity for mediastinal pathology. Atherosclerosis in the thoracic aorta. Status post median sternotomy for CABG. Calcifications in the apices of the lungs bilaterally. IMPRESSION: 1. Decreasing moderate right pleural effusion with some basilar atelectasis in the right lung. 2. Cardiomegaly with evidence of mild interstitial pulmonary edema, which may suggest congestive heart failure. 3. Aortic atherosclerosis. Electronically Signed   By: Vinnie Langton M.D.   On: 06/27/2017 14:45     Medications:   . sodium chloride    . ceFEPime (MAXIPIME) IV    . vancomycin     . amLODipine  10 mg Oral Daily  . calcium carbonate  1 tablet Oral TID WC  . feeding supplement (PRO-STAT SUGAR FREE 64)  30 mL Oral TID WC  . folic acid  0.5 mg Oral Daily  . Ganciclovir  1 drop Right Eye QID  . heparin  5,000 Units Subcutaneous Q8H  . hydrALAZINE  25 mg Oral QID  . insulin aspart  0-5 Units Subcutaneous QHS  . insulin aspart  0-9 Units Subcutaneous TID WC  . ipratropium-albuterol  3 mL Nebulization Q6H  . levothyroxine  75 mcg Oral Daily  . metoprolol tartrate  50 mg Oral BID  . pantoprazole  40 mg Oral BID AC  . polyethylene glycol  17 g Oral Daily  . pravastatin  40 mg Oral Daily  . sodium chloride flush  3 mL Intravenous Q12H  . ascorbic acid  500 mg Oral BID  . zinc sulfate  220 mg Oral Daily   sodium chloride, acetaminophen **OR** acetaminophen, albuterol, bisacodyl, guaiFENesin-dextromethorphan, HYDROcodone-acetaminophen,  ondansetron **OR** ondansetron (ZOFRAN) IV, polyvinyl alcohol, senna-docusate, sodium chloride flush, vancomycin  Assessment/ Plan:  [name] is a 84 y.o. white female with end stage renal disease on hemodialysis, coronary artery disease, history of bilateral knee replacement, aortic valve replacement 2009, diabetes, hypertension, hypothyroidism, peripheral vascular disease, pulmonary hypertension, anxiety  [facility]/MWF  1. End Stage Renal Disease: seen and examined on hemodialysis.   2.  Anemia of chronic kidney disease: hemoglobin 10.6 - mircera as outpatient - EPO with HD treatments  3. Secondary Hyperparathyroidism:  - tums with meals.     LOS: 1 [place] Shimabukuro 6/24/20191:47 PM

## 2017-06-28 NOTE — Telephone Encounter (Signed)
[name] stated tomorrow might be a conflict I schedule for 4/83 @ 2

## 2017-06-28 NOTE — Telephone Encounter (Signed)
This encounter was created in error - please disregard.

## 2017-06-28 NOTE — Patient Outreach (Addendum)
[facility]) Care Management  06/28/2017  LIDDY DEAM 1920-04-08 [id number]   Post Acute coordination phone call to [name], discharge planner at [facility] who confirmed patient's discharge on 06/25/17. Janett Billow reported that patient discharged home on Friday and her son  moved her [facility] on Saturday, 06/26/17.  Late Entry-chart reviewed, patient hospitalized. This Education officer, museum to follow patient's progress.    [name] [facility] Care Management [phone]

## 2017-06-28 NOTE — Progress Notes (Signed)
HD Tx started w/o complication    23/01/72 1109  Vital Signs  Pulse Rate 89  Pulse Rate Source Monitor  Resp 18  BP (!) 143/49  BP Location Right Arm  BP Method Automatic  Patient Position (if appropriate) Lying  Oxygen Therapy  SpO2 98 %  O2 Device Nasal Cannula  O2 Flow Rate (L/min) 3 L/min  Pulse Oximetry Type Continuous  During Hemodialysis Assessment  Blood Flow Rate (mL/min) 400 mL/min  Fistula / Graft Left Upper arm Arteriovenous fistula  No Placement Date or Time found.   Orientation: Left  Access Location: Upper arm  Access Type: Arteriovenous fistula  Site Condition No complications  Fistula / Graft Assessment Bruit;Thrill;Present  Status Accessed

## 2017-06-28 NOTE — Progress Notes (Signed)
HD Tx ended    06/28/17 1407  Vital Signs  Pulse Rate 89  Pulse Rate Source Monitor  BP (!) 127/49  BP Location Right Arm  BP Method Automatic  Patient Position (if appropriate) Lying  Oxygen Therapy  SpO2 100 %  O2 Device Nasal Cannula  O2 Flow Rate (L/min) 2 L/min  During Hemodialysis Assessment  HD Safety Checks Performed Yes  Dialysis Fluid Bolus Normal Saline  Bolus Amount (mL) 250 mL  Intra-Hemodialysis Comments Tx completed;Tolerated well

## 2017-06-29 ENCOUNTER — Ambulatory Visit: Payer: Medicare Other | Admitting: *Deleted

## 2017-06-29 DIAGNOSIS — Z7189 Other specified counseling: Secondary | ICD-10-CM

## 2017-06-29 DIAGNOSIS — Z515 Encounter for palliative care: Secondary | ICD-10-CM

## 2017-06-29 LAB — GLUCOSE, CAPILLARY
GLUCOSE-CAPILLARY: 100 mg/dL — AB (ref 70–99)
GLUCOSE-CAPILLARY: 111 mg/dL — AB (ref 70–99)
GLUCOSE-CAPILLARY: 149 mg/dL — AB (ref 70–99)
Glucose-Capillary: 105 mg/dL — ABNORMAL HIGH (ref 70–99)

## 2017-06-29 MED ORDER — CEFDINIR 300 MG PO CAPS
300.0000 mg | ORAL_CAPSULE | Freq: Two times a day (BID) | ORAL | Status: DC
  Administered 2017-06-29: 300 mg via ORAL
  Filled 2017-06-29 (×5): qty 1

## 2017-06-29 NOTE — Consult Note (Signed)
Consultation Note Date: 06/29/2017   Patient Name: [name]  DOB: 1920/12/17  MRN: [id number]  Age / Sex: 84 y.o., female  PCP: [name], MD Referring Physician: Gorden Harms, MD  Reason for Consultation: Establishing goals of care  HPI/Patient Profile: 84 y.o. female  with past medical history of ESRD on HD, CAD, diastolic HF, AVR 7169, DM, HTN, hypothyroidism, PVD, pulm htn,  admitted on 06/27/2017 with respiratory failure d/t volume overload and UTI. Also had elevated tropponins most likely r/t ESRD. PMT consulted for [place].  Clinical Assessment and Goals of Care: I have reviewed medical records including EPIC notes, labs and imaging, received report from nursing staff, assessed the patient and then spoke with patient's son, Mabell Esguerra,  to discuss diagnosis prognosis, [place], EOL wishes, disposition and options.  During my assessment, patient would not respond to me enough to participate in [place] - per son, this is normal for her and mental status fluctuates.   I introduced Palliative Medicine as specialized medical care for people living with serious illness. It focuses on providing relief from the symptoms and stress of a serious illness. The goal is to improve quality of life for both the patient and the family.  [name] tells me patient has been on HD since Dec 2012. She had been doing well and living independently until December when she was hospitalized for pulmonary edema and UTI. Since then patient has been in and out of hospital and rehab (has had 5 admission is previous 6 months).  As far as functional and nutritional status, [name] is reluctant to admit she is declining. He tells me she requires a walker for ambulation and has difficulty standing. He also tells me of decreased appetite and recent weight loss. She has been having incontinent episodes. She has periods of "forgetfulness" and spends most of her time sleeping.     Despite the decline he speaks of, he feels she has great quality of life. Tells me about taking her to festivals and how much she enjoys this - however he does share she has not been able to participate for the past year d/t various illnesses.   He shares with me that he has a belief that he has to "be aggressive" with his mother's care because she is strong and this is what she would want.  He tells me another outpatient medical professional encouraged him to continue to be aggressive.   We discussed her current illness and what it means in the larger context of her on-going co-morbidities.  Natural disease trajectory and expectations at EOL were discussed. I shared my concerns about her frailty and repeated hospitalizations.  I attempted to elicit values and goals of care important to the patient.    Advanced directives, concepts specific to code status, artifical feeding and hydration, and rehospitalization were considered and discussed. Patient is a DNR and has a living will.   Hospice and Palliative Care services outpatient were explained. Patient is not eligible for hospice as long as she continues HD and at this time [name] feels she should continue HD. He accepts outpatient palliative care.   Questions and concerns were addressed. The family was encouraged to call with questions or concerns.   Primary Decision Maker PATIENT - mental status fluctuates, joined by son, [name]   SUMMARY OF RECOMMENDATIONS   - outpatient palliative - son agrees - long discussion with son about his mother's medical conditions - unsure if he grasps poor prognosis - he is  hopeful for improvement and expresses a desire to "be aggressive" with her medical care  Code Status/Advance Care Planning:  DNR   Symptom Management:   Per primary  Prognosis:   Unable to determine  Discharge Planning: [place] for rehab with Palliative care service follow-up      Primary  Diagnoses: Present on Admission: . Acute respiratory failure with hypoxia ([place])   I have reviewed the medical record, interviewed the patient and family, and examined the patient. The following aspects are pertinent.  Past Medical History:  Diagnosis Date  . Anemia    NOS  . Anxiety   . Aortic stenosis   . Cancer [facility])    Mole on top of head to be removed in July 2013  . Chronic diastolic CHF (congestive heart failure) ([place])    a. 01/2017 Echo: EF 60-65%, no rwma, Gr2 DD, mild to mod MR, mildly dil LA/RA, nl RV fxn, Sev TR, PASP 34mmHg.  . Constipation   . Coronary artery disease    a. 2009 CABG x 3 w/ bioprosthetic AVR.  . Diabetes mellitus    type II;was on Glipizide but has been off x 82mon  . Dialysis patient ([place])   . Diarrhea   . ESRD (end stage renal disease) ([place])   . GERD (gastroesophageal reflux disease)   . History of blood transfusion   . Hyperlipidemia    takes Simvastatin nightly  . Hypertension    takes Metoprolol daily  . Hypothyroidism    takes Synthroid daily  . Migraine    hx of  . Oligouria   . Osteoarthritis   . Osteopenia   . Peripheral vascular disease ([place])   . Personal history of colonic polyps   . Pulmonary hypertension ([place])   . Urinary incontinence   . UTI (urinary tract infection)    Social History   Socioeconomic History  . Marital status: Widowed    Spouse name: Not on file  . Number of children: 1  . Years of education: Not on file  . Highest education level: Not on file  Occupational History  . Occupation: Retired Armed forces training and education officer  Social Needs  . Financial resource strain: Not on file  . Food insecurity:    Worry: Not on file    Inability: Not on file  . Transportation needs:    Medical: Not on file    Non-medical: Not on file  Tobacco Use  . Smoking status: Former Smoker    Years: 15.00    Types: Cigarettes    Last attempt to quit: 01/06/1943    Years since quitting: 74.5  . Smokeless tobacco: Never Used   Substance and Sexual Activity  . Alcohol use: Yes    Comment: Wine occasionally  . Drug use: No  . Sexual activity: Never  Lifestyle  . Physical activity:    Days per week: Not on file    Minutes per session: Not on file  . Stress: Not on file  Relationships  . Social connections:    Talks on phone: Not on file    Gets together: Not on file    Attends religious service: Not on file    Active member of club or organization: Not on file    Attends meetings of clubs or organizations: Not on file    Relationship status: Not on file  Other Topics Concern  . Not on file  Social History Narrative   Son Evlyn Clines to make decisions for her  Discussed DNR and she requests --done 05/03/14   No tube feeds if cognitively unaware   Family History  Family history unknown: Yes   Scheduled Meds: . amLODipine  10 mg Oral Daily  . calcium carbonate  1 tablet Oral TID WC  . cefdinir  300 mg Oral Q12H  . feeding supplement (ENSURE ENLIVE)  237 mL Oral TID BM  . feeding supplement (PRO-STAT SUGAR FREE 64)  30 mL Oral TID WC  . folic acid  0.5 mg Oral Daily  . Ganciclovir  1 drop Right Eye QID  . heparin  5,000 Units Subcutaneous Q8H  . hydrALAZINE  25 mg Oral QID  . insulin aspart  0-5 Units Subcutaneous QHS  . insulin aspart  0-9 Units Subcutaneous TID WC  . ipratropium-albuterol  3 mL Nebulization Q6H  . levothyroxine  75 mcg Oral Daily  . metoprolol tartrate  50 mg Oral BID  . multivitamin  1 tablet Oral QHS  . pantoprazole  40 mg Oral BID AC  . polyethylene glycol  17 g Oral Daily  . pravastatin  40 mg Oral Daily  . sodium chloride flush  3 mL Intravenous Q12H  . ascorbic acid  500 mg Oral BID  . zinc sulfate  220 mg Oral Daily   Continuous Infusions: . sodium chloride Stopped (06/28/17 1900)   PRN Meds:.sodium chloride, acetaminophen **OR** acetaminophen, albuterol, bisacodyl, guaiFENesin-dextromethorphan, HYDROcodone-acetaminophen, ondansetron **OR** ondansetron (ZOFRAN) IV,  polyvinyl alcohol, senna-docusate, sodium chloride flush Allergies  Allergen Reactions  . Nitrofurantoin Itching  . Ace Inhibitors   . Captopril Other (See Comments)    REACTION: unspecified  . Enalapril Maleate Cough  . Ramipril Other (See Comments)    REACTION: unspecified  . Sulfa Antibiotics Other (See Comments)    Reaction unknown  . Verapamil Other (See Comments)    REACTION: unspecified   Review of Systems  Unable to perform ROS: Mental status change    Physical Exam  Constitutional: No distress.  Frail  HENT:  Head: Normocephalic and atraumatic.  Cardiovascular: Normal rate and regular rhythm.  Pulmonary/Chest: Effort normal. No respiratory distress.  Breath sounds clear anteriorly  Abdominal: Soft. Bowel sounds are normal.  Neurological:  Does not respond to voice, shakes head to touch  Skin: Skin is warm and dry.    Vital Signs: BP (!) 148/57 (BP Location: Left Arm)   Pulse 93   Temp 98 F (36.7 C) (Oral)   Resp (!) 26   Ht 5\' 2"  (1.575 m)   Wt 44 kg (97 lb)   SpO2 91%   BMI 17.74 kg/m  Pain Scale: 0-10   Pain Score: 0-No pain   SpO2: SpO2: 91 % O2 Device:SpO2: 91 % O2 Flow Rate: .O2 Flow Rate (L/min): 1 L/min  IO: Intake/output summary:   Intake/Output Summary (Last 24 hours) at 06/29/2017 1530 Last data filed at 06/29/2017 1344 Gross per 24 hour  Intake 350 ml  Output -  Net 350 ml    LBM: Last BM Date: 06/28/17 Baseline Weight: Weight: 44 kg (97 lb) Most recent weight: Weight: 44 kg (97 lb)     Palliative Assessment/Data: PPS 50%    Time Total: 70 minutes Greater than 50%  of this time was spent counseling and coordinating care related to the above assessment and plan.  Juel Burrow, DNP, AGNP-C Palliative Medicine Team [phone] Pager: [phone]

## 2017-06-29 NOTE — Progress Notes (Signed)
[facility]  ROUNDING NOTE   Subjective:   Pleasantly confused.   Hemodialysis treatment yesterday. Tolerated treatment well. UF of 568mL   Objective:  Vital signs in last 24 hours:  Temp:  [98 F (36.7 C)-98.4 F (36.9 C)] 98 F (36.7 C) (06/25 1204) Pulse Rate:  [86-93] 93 (06/25 1204) Resp:  [17-26] 26 (06/25 1204) BP: (131-156)/(53-57) 148/57 (06/25 1204) SpO2:  [84 %-96 %] 91 % (06/25 1204)  Weight change: 0.5 kg (1 lb 1.6 oz) Filed Weights   06/27/17 1347 06/28/17 1104 06/28/17 1409  Weight: 44 kg (97 lb) 44.5 kg (98 lb 1.7 oz) 44 kg (97 lb)    Intake/Output: I/O last 3 completed shifts: In: 784 [P.O.:360; I.V.:30; IV Piggyback:394] Out: 500 [Other:500]   Intake/Output this shift:  Total I/O In: 120 [P.O.:120] Out: -   Physical Exam: General: NAD, laying in bed  Head: Normocephalic, atraumatic. Moist oral mucosal membranes  Eyes: Anicteric, PERRL  Neck: Supple, trachea midline  Lungs:  Clear to auscultation  Heart: Regular rate and rhythm  Abdomen:  Soft, nontender,   Extremities: no peripheral edema.  Neurologic: Nonfocal, moving all four extremities  Skin: No lesions  Access: Left AVF    Basic Metabolic Panel: Recent Labs  Lab 06/27/17 1357 06/28/17 0418 06/28/17 1345  NA 133* 134* 132*  K 3.9 4.2 4.2  CL 93* 97* 95*  CO2 26 25 23   GLUCOSE 119* 80 116*  BUN 34* 39* 41*  CREATININE 3.99* 4.50* 4.79*  CALCIUM 9.0 8.2* 8.2*  PHOS  --   --  5.1*    Liver Function Tests: Recent Labs  Lab 06/27/17 1357 06/28/17 1345  AST 24  --   ALT 13*  --   ALKPHOS 177*  --   BILITOT 1.2  --   PROT 8.2*  --   ALBUMIN 3.3* 2.7*   No results for input(s): LIPASE, AMYLASE in the last 168 hours. No results for input(s): AMMONIA in the last 168 hours.  CBC: Recent Labs  Lab 06/27/17 1357 06/28/17 0418 06/28/17 1345  WBC 10.4 7.5 7.3  NEUTROABS 8.6*  --  6.0  HGB 12.4 10.6* 10.7*  HCT 37.9 31.7* 32.5*  MCV 96.5 96.3 96.4  PLT  179 147* 154    Cardiac Enzymes: Recent Labs  Lab 06/27/17 1357  TROPONINI 0.04*    BNP: Invalid input(s): POCBNP  CBG: Recent Labs  Lab 06/28/17 0809 06/28/17 1635 06/28/17 2144 06/29/17 0735 06/29/17 1139  GLUCAP 102* 124* 140* 100* 111*    Microbiology: Results for orders placed or performed during the hospital encounter of 06/27/17  Blood Culture (routine x 2)     Status: None (Preliminary result)   Collection Time: 06/27/17  1:59 PM  Result Value Ref Range Status   Specimen Description BLOOD RIGHT ARM  Final   Special Requests   Final    BOTTLES DRAWN AEROBIC AND ANAEROBIC Blood Culture adequate volume   Culture   Final    NO GROWTH 2 DAYS Performed at [facility], [address], [place], [place] [address]    Report Status PENDING  Incomplete  Blood Culture (routine x 2)     Status: None (Preliminary result)   Collection Time: 06/27/17  1:59 PM  Result Value Ref Range Status   Specimen Description BLOOD RIGHT ARM  Final   Special Requests   Final    BOTTLES DRAWN AEROBIC AND ANAEROBIC Blood Culture adequate volume   Culture   Final  NO GROWTH 2 DAYS Performed at [facility], [address] [place], [place] [address]    Report Status PENDING  Incomplete  Urine culture     Status: None (Preliminary result)   Collection Time: 06/27/17  3:05 PM  Result Value Ref Range Status   Specimen Description   Final    URINE, RANDOM Performed at [facility], [address], [place], [place] [address]    Special Requests   Final    NONE Performed at [facility], [address], [place], [place] [address]    Culture   Final    CULTURE REINCUBATED FOR BETTER GROWTH Performed at [facility], [place] [address], [place], [place] [address]    Report Status PENDING  Incomplete  MRSA PCR Screening     Status: None   Collection Time: 06/27/17  4:54 PM  Result Value Ref Range Status   MRSA by PCR NEGATIVE NEGATIVE Final     Comment:        The GeneXpert MRSA Assay (FDA approved for NASAL specimens only), is one component of a comprehensive MRSA colonization surveillance program. It is not intended to diagnose MRSA infection nor to guide or monitor treatment for MRSA infections. Performed at [facility], [address] [place], [place] [address]     Coagulation Studies: No results for input(s): LABPROT, INR in the last 72 hours.  Urinalysis: Recent Labs    06/27/17 1505  COLORURINE AMBER*  LABSPEC 1.013  PHURINE 6.0  GLUCOSEU NEGATIVE  HGBUR MODERATE*  BILIRUBINUR NEGATIVE  KETONESUR NEGATIVE  PROTEINUR 100*  NITRITE NEGATIVE  LEUKOCYTESUR MODERATE*      Imaging: No results found.   Medications:   . sodium chloride Stopped (06/28/17 1900)   . amLODipine  10 mg Oral Daily  . calcium carbonate  1 tablet Oral TID WC  . cefdinir  300 mg Oral Q12H  . feeding supplement (ENSURE ENLIVE)  237 mL Oral TID BM  . feeding supplement (PRO-STAT SUGAR FREE 64)  30 mL Oral TID WC  . folic acid  0.5 mg Oral Daily  . Ganciclovir  1 drop Right Eye QID  . heparin  5,000 Units Subcutaneous Q8H  . hydrALAZINE  25 mg Oral QID  . insulin aspart  0-5 Units Subcutaneous QHS  . insulin aspart  0-9 Units Subcutaneous TID WC  . ipratropium-albuterol  3 mL Nebulization Q6H  . levothyroxine  75 mcg Oral Daily  . metoprolol tartrate  50 mg Oral BID  . multivitamin  1 tablet Oral QHS  . pantoprazole  40 mg Oral BID AC  . polyethylene glycol  17 g Oral Daily  . pravastatin  40 mg Oral Daily  . sodium chloride flush  3 mL Intravenous Q12H  . ascorbic acid  500 mg Oral BID  . zinc sulfate  220 mg Oral Daily   sodium chloride, acetaminophen **OR** acetaminophen, albuterol, bisacodyl, guaiFENesin-dextromethorphan, HYDROcodone-acetaminophen, ondansetron **OR** ondansetron (ZOFRAN) IV, polyvinyl alcohol, senna-docusate, sodium chloride flush  Assessment/ Plan:  [name] is a 84 y.o.  white female with end stage renal disease on hemodialysis, coronary artery disease, history of bilateral knee replacement, aortic valve replacement 2009, diabetes, hypertension, hypothyroidism, peripheral vascular disease, pulmonary hypertension, anxiety  [facility] [address]/MWF  1. End Stage Renal Disease:  Continue MWF schedule   2.  Anemia of chronic kidney disease: hemoglobin 10.7 - mircera as outpatient   3. Secondary Hyperparathyroidism: phosphorus and calcium at goal.  -  tums with meals.     LOS: 2 Kerry-Anne Mezo 6/25/20194:31 PM

## 2017-06-29 NOTE — Clinical Social Work Note (Signed)
Clinical Social Work Assessment  Patient Details  Name: [name] MRN: [id number] Date of Birth: 07-27-1920  Date of referral:  06/29/17               Reason for consult:  Facility Placement                Permission sought to share information with:  Case Manager, Customer service manager, Family Supports Permission granted to share information::  Yes, Verbal Permission Granted  Name::       Agency::     Relationship::     Contact Information:     Housing/Transportation Living arrangements for the past 2 months:  [place] of Information:  Adult Children Patient Interpreter Needed:  None Criminal Activity/Legal Involvement Pertinent to Current Situation/Hospitalization:  No - Comment as needed Significant Relationships:  Adult Children Lives with:  Facility Resident Do you feel safe going back to the place where you live?  Yes Need for family participation in patient care:  Yes (Comment)  Care giving concerns:  Patient lives at [facility] in [place] Worker assessment / plan:  CSW noted in patient's chart that she is from a facility. CSW attempted to meet with patient but she was very tired and could not stay awake. CSW contacted patient's [name] [phone]. Son states that patient was discharged from [facility] on 06/25/17 and moved into [facility] on 06/26/17. Per son, patient may need more assistance than her AL facility. CSW explained that therapy would make a recommendation and discharge plan would be determined based on that. Son states that he understands and is fine with whatever disposition the physician and therapy determine is appropriate for patient. CSW will watch for therapy recommendation and proceed as indicated. CSW will continue to follow for discharge planning.   Employment status:  Retired Nurse, adult PT Recommendations:  Not assessed at this time Information /  Referral to community resources:     Patient/Family's Response to care:  Patient's son thanked CSW for assistance   Patient/Family's Understanding of and Emotional Response to Diagnosis, Current Treatment, and Prognosis:  Son states that he understands current condition   Emotional Assessment Appearance:  Appears stated age Attitude/Demeanor/Rapport:  Lethargic Affect (typically observed):    Orientation:  Oriented to Self, Oriented to Place Alcohol / Substance use:  Not Applicable Psych involvement (Current and /or in the community):  No (Comment)  Discharge Needs  Concerns to be addressed:  Discharge Planning Concerns Readmission within the last 30 days:  No Current discharge risk:  None Barriers to Discharge:  Continued Medical Work up   [employer], [place] 06/29/2017, 11:08 AM

## 2017-06-29 NOTE — Progress Notes (Signed)
PT Cancellation Note  Patient Details Name: [name] MRN: [id number] DOB: 1920/07/15   Cancelled Treatment:    Reason Eval/Treat Not Completed: Patient declined, no reason specified.  PT consult received.  Chart reviewed.  Pt sleeping upon PT arrival.  Pt woken with vc's but when asked to participate in PT pt shook her head no and closed her eyes again; pt opened her eyes when therapist was asking pt why she didn't want to participate in therapy but pt did not answer; therapist asked pt again if she would participate in PT but pt again shook her head no and closed her eyes again.  Will re-attempt PT evaluation at a later date/time.   Leitha Bleak, PT 06/29/17, 2:21 PM [phone]

## 2017-06-29 NOTE — Progress Notes (Signed)
[facility] [place] NAME: Sarai January    MR#:  [id number]  DATE OF BIRTH:  12-Jan-1920  SUBJECTIVE:  CHIEF COMPLAINT:   Chief Complaint  Patient presents with  . Nasal Congestion  . Weakness  No events overnight, patient without complaint, noted severe malnutrition for which dietary was consulted, palliative care consulted given poor prognosis and advanced age  REVIEW OF SYSTEMS:  CONSTITUTIONAL: No fever, fatigue or weakness.  EYES: No blurred or double vision.  EARS, NOSE, AND THROAT: No tinnitus or ear pain.  RESPIRATORY: No cough, shortness of breath, wheezing or hemoptysis.  CARDIOVASCULAR: No chest pain, orthopnea, edema.  GASTROINTESTINAL: No nausea, vomiting, diarrhea or abdominal pain.  GENITOURINARY: No dysuria, hematuria.  ENDOCRINE: No polyuria, nocturia,  HEMATOLOGY: No anemia, easy bruising or bleeding SKIN: No rash or lesion. MUSCULOSKELETAL: No joint pain or arthritis.   NEUROLOGIC: No tingling, numbness, weakness.  PSYCHIATRY: No anxiety or depression.   ROS  DRUG ALLERGIES:   Allergies  Allergen Reactions  . Nitrofurantoin Itching  . Ace Inhibitors   . Captopril Other (See Comments)    REACTION: unspecified  . Enalapril Maleate Cough  . Ramipril Other (See Comments)    REACTION: unspecified  . Sulfa Antibiotics Other (See Comments)    Reaction unknown  . Verapamil Other (See Comments)    REACTION: unspecified    VITALS:  Blood pressure (!) 148/57, pulse 93, temperature 98 F (36.7 C), temperature source Oral, resp. rate (!) 26, height 5\' 2"  (1.575 m), weight 44 kg (97 lb), SpO2 91 %.  PHYSICAL EXAMINATION:  GENERAL:  84 y.o.-year-old patient lying in the bed with no acute distress.  EYES: Pupils equal, round, reactive to light and accommodation. No scleral icterus. Extraocular muscles intact.  HEENT: Head atraumatic, normocephalic. Oropharynx and nasopharynx clear.  NECK:  Supple, no jugular venous  distention. No thyroid enlargement, no tenderness.  LUNGS: Normal breath sounds bilaterally, no wheezing, rales,rhonchi or crepitation. No use of accessory muscles of respiration.  CARDIOVASCULAR: S1, S2 normal. No murmurs, rubs, or gallops.  ABDOMEN: Soft, nontender, nondistended. Bowel sounds present. No organomegaly or mass.  EXTREMITIES: No pedal edema, cyanosis, or clubbing.  NEUROLOGIC: Cranial nerves II through XII are intact. Muscle strength 5/5 in all extremities. Sensation intact. Gait not checked.  PSYCHIATRIC: The patient is alert and oriented x 3.  SKIN: No obvious rash, lesion, or ulcer.   Physical Exam LABORATORY PANEL:   CBC Recent Labs  Lab 06/28/17 1345  WBC 7.3  HGB 10.7*  HCT 32.5*  PLT 154   ------------------------------------------------------------------------------------------------------------------  Chemistries  Recent Labs  Lab 06/27/17 1357  06/28/17 1345  NA 133*   < > 132*  K 3.9   < > 4.2  CL 93*   < > 95*  CO2 26   < > 23  GLUCOSE 119*   < > 116*  BUN 34*   < > 41*  CREATININE 3.99*   < > 4.79*  CALCIUM 9.0   < > 8.2*  AST 24  --   --   ALT 13*  --   --   ALKPHOS 177*  --   --   BILITOT 1.2  --   --    < > = values in this interval not displayed.   ------------------------------------------------------------------------------------------------------------------  Cardiac Enzymes Recent Labs  Lab 06/27/17 1357  TROPONINI 0.04*   ------------------------------------------------------------------------------------------------------------------  RADIOLOGY:  Dg Chest Port 1 View  Result Date: 06/27/2017 CLINICAL DATA:  84 year old female with history of productive cough with yellow mucus. Wheezing and coarse breath sounds. EXAM: PORTABLE CHEST 1 VIEW COMPARISON:  Chest x-ray 05/22/2017. FINDINGS: Decreasing moderate right pleural effusion. No left pleural effusion. Atelectasis in the base of the right lung. No definite consolidative  airspace disease. Cephalization of the pulmonary vasculature with slight indistinctness of interstitial markings. Mild cardiomegaly. The patient is rotated to the left on today's exam, resulting in distortion of the mediastinal contours and reduced diagnostic sensitivity and specificity for mediastinal pathology. Atherosclerosis in the thoracic aorta. Status post median sternotomy for CABG. Calcifications in the apices of the lungs bilaterally. IMPRESSION: 1. Decreasing moderate right pleural effusion with some basilar atelectasis in the right lung. 2. Cardiomegaly with evidence of mild interstitial pulmonary edema, which may suggest congestive heart failure. 3. Aortic atherosclerosis. Electronically Signed   By: Vinnie Langton M.D.   On: 06/27/2017 14:45    ASSESSMENT AND PLAN:  *Acute respiratory failure with hypoxia due to pneumonia and pulmonary edema Resolving Most likely secondary to acute fluid overload state from end-stage renal disease/diastolic heart failure Pneumonia ruled out  Continue supplemental oxygen with weaning as tolerated  *Acute fluid overload state  Resolving Secondary to end-stage renal disease  Case discussed with nephrology, given multiple comorbidities and poor prognosis going forward-palliative care consulted  Nephrology following for hemodialysis needs    *Chronic end-stage renal disease  Plan of care as stated above   *Acute probable UTI Resolving  Cefdinir twice daily and follow-up on cultures   *Acute elevated troponins  Most likely secondary to chronic renal disease  Continue conservative medical management   *Chronic diabetes mellitus type 2  Sliding scale insulin with active checks per routine   *Chronic benign essential hypertension  Stable  Continue current regiment   *Chronic deconditioning, adult failure to thrive  Continue increased nursing care PRN, aspiration/fall/skin care precautions while in house, Dietary input appreciated,  palliative care consulted  *Acute on chronic severe protein energy/calorie malnutrition Dietary input appreciated   Disposition pending clinical course   All the records are reviewed and case discussed with Care Management/Social Workerr. Management plans discussed with the patient, family and they are in agreement.  CODE STATUS: dnr  TOTAL TIME TAKING CARE OF THIS PATIENT: 35 minutes.     POSSIBLE D/C IN 1-3 DAYS, DEPENDING ON CLINICAL CONDITION.   Avel Peace Salary M.D on 06/29/2017   Between 7am to 6pm - Pager - [phone]  After 6pm go to [email or web address] - password [email or web address] [facility]  Office  [phone]  CC: Primary care physician; [name], MD  Note: This dictation was prepared with Dragon dictation along with smaller phrase technology. Any transcriptional errors that result from this process are unintentional.

## 2017-06-29 NOTE — Care Management (Signed)
[name] dialysis liaison notified of admission.   Patient was discharged from [facility] on 6/21 to the [facility]. [facility] received home health orders for RN, PT, Ot, and SW.  Patient was not opened prior to admission. Corene Cornea with [facility] aware of admission.  PT eval pending.

## 2017-06-30 ENCOUNTER — Telehealth: Payer: Self-pay

## 2017-06-30 ENCOUNTER — Inpatient Hospital Stay: Payer: Medicare Other

## 2017-06-30 DIAGNOSIS — N189 Chronic kidney disease, unspecified: Secondary | ICD-10-CM

## 2017-06-30 DIAGNOSIS — R5383 Other fatigue: Secondary | ICD-10-CM

## 2017-06-30 DIAGNOSIS — R531 Weakness: Secondary | ICD-10-CM

## 2017-06-30 DIAGNOSIS — T82868A Thrombosis of vascular prosthetic devices, implants and grafts, initial encounter: Secondary | ICD-10-CM

## 2017-06-30 LAB — BASIC METABOLIC PANEL
Anion gap: 13 (ref 5–15)
BUN: 37 mg/dL — AB (ref 8–23)
CHLORIDE: 100 mmol/L (ref 98–111)
CO2: 23 mmol/L (ref 22–32)
CREATININE: 4.37 mg/dL — AB (ref 0.44–1.00)
Calcium: 8.6 mg/dL — ABNORMAL LOW (ref 8.9–10.3)
GFR calc Af Amer: 9 mL/min — ABNORMAL LOW (ref >60)
GFR calc non Af Amer: 8 mL/min — ABNORMAL LOW (ref >60)
Glucose, Bld: 115 mg/dL — ABNORMAL HIGH (ref 70–99)
POTASSIUM: 4.7 mmol/L (ref 3.5–5.1)
Sodium: 136 mmol/L (ref 135–145)

## 2017-06-30 LAB — URINE CULTURE

## 2017-06-30 LAB — TROPONIN I
Troponin I: 0.03 ng/mL (ref <0.03)
Troponin I: 0.03 ng/mL (ref <0.03)

## 2017-06-30 LAB — GLUCOSE, CAPILLARY
GLUCOSE-CAPILLARY: 116 mg/dL — AB (ref 70–99)
Glucose-Capillary: 104 mg/dL — ABNORMAL HIGH (ref 70–99)
Glucose-Capillary: 107 mg/dL — ABNORMAL HIGH (ref 70–99)
Glucose-Capillary: 88 mg/dL (ref 70–99)

## 2017-06-30 LAB — AMMONIA: AMMONIA: 22 umol/L (ref 9–35)

## 2017-06-30 LAB — CBC
HCT: 34.6 % — ABNORMAL LOW (ref 35.0–47.0)
Hemoglobin: 11.5 g/dL — ABNORMAL LOW (ref 12.0–16.0)
MCH: 32 pg (ref 26.0–34.0)
MCHC: 33.1 g/dL (ref 32.0–36.0)
MCV: 96.6 fL (ref 80.0–100.0)
PLATELETS: 193 10*3/uL (ref 150–440)
RBC: 3.59 MIL/uL — ABNORMAL LOW (ref 3.80–5.20)
RDW: 18.6 % — AB (ref 11.5–14.5)
WBC: 5.9 10*3/uL (ref 3.6–11.0)

## 2017-06-30 LAB — BLOOD GAS, ARTERIAL
Acid-Base Excess: 1.7 mmol/L (ref 0.0–2.0)
BICARBONATE: 26.6 mmol/L (ref 20.0–28.0)
FIO2: 0.28
O2 Saturation: 95.2 %
PCO2 ART: 42 mmHg (ref 32.0–48.0)
PH ART: 7.41 (ref 7.350–7.450)
Patient temperature: 37
pO2, Arterial: 76 mmHg — ABNORMAL LOW (ref 83.0–108.0)

## 2017-06-30 LAB — PHOSPHORUS: PHOSPHORUS: 4.7 mg/dL — AB (ref 2.5–4.6)

## 2017-06-30 LAB — ALBUMIN: ALBUMIN: 2.7 g/dL — AB (ref 3.5–5.0)

## 2017-06-30 LAB — MAGNESIUM: Magnesium: 2.4 mg/dL (ref 1.7–2.4)

## 2017-06-30 MED ORDER — CEFDINIR 300 MG PO CAPS: 300.0000 mg | ORAL_CAPSULE | Freq: Two times a day (BID) | ORAL | 0 refills | Status: DC

## 2017-06-30 MED ORDER — IPRATROPIUM-ALBUTEROL 0.5-2.5 (3) MG/3ML IN SOLN
3.0000 mL | Freq: Four times a day (QID) | RESPIRATORY_TRACT | Status: DC | PRN
Start: 2017-06-30 — End: 2017-07-08

## 2017-06-30 NOTE — Care Management Important Message (Signed)
Copy of signed IM left with patient in room.  

## 2017-06-30 NOTE — Progress Notes (Addendum)
New referral for outpatient Palliative to follow at [facility] received from [place]. Patient to discharge today. Patient information faxed to referral. Flo Shanks RN, BSN, [facility], hospital liaison [phone]

## 2017-06-30 NOTE — Plan of Care (Signed)
Patient has been more lethargic this afternoon; this morning patient refused medications.  MD aware.  Continue to monitor.

## 2017-06-30 NOTE — Discharge Summary (Addendum)
[facility] [place] NAME: [name]    MR#:  [id number]  DATE OF BIRTH:  13-May-1920  DATE OF ADMISSION:  06/27/2017 ADMITTING PHYSICIAN: Demetrios Loll, MD  DATE OF DISCHARGE: No discharge date for patient encounter.  PRIMARY CARE PHYSICIAN: [name], MD    ADMISSION DIAGNOSIS:  Acute cystitis with hematuria [N30.01] Acute respiratory failure with hypoxia ([place]) [J96.01]  DISCHARGE DIAGNOSIS:  Active Problems:   Acute respiratory failure with hypoxia (HCC)   Goals of care, counseling/discussion   Palliative care by specialist   SECONDARY DIAGNOSIS:   Past Medical History:  Diagnosis Date  . Anemia    NOS  . Anxiety   . Aortic stenosis   . Cancer [facility])    Mole on top of head to be removed in July 2013  . Chronic diastolic CHF (congestive heart failure) ([place])    a. 01/2017 Echo: EF 60-65%, no rwma, Gr2 DD, mild to mod MR, mildly dil LA/RA, nl RV fxn, Sev TR, PASP 48mmHg.  . Constipation   . Coronary artery disease    a. 2009 CABG x 3 w/ bioprosthetic AVR.  . Diabetes mellitus    type II;was on Glipizide but has been off x 73mon  . Dialysis patient ([place])   . Diarrhea   . ESRD (end stage renal disease) ([place])   . GERD (gastroesophageal reflux disease)   . History of blood transfusion   . Hyperlipidemia    takes Simvastatin nightly  . Hypertension    takes Metoprolol daily  . Hypothyroidism    takes Synthroid daily  . Migraine    hx of  . Oligouria   . Osteoarthritis   . Osteopenia   . Peripheral vascular disease ([place])   . Personal history of colonic polyps   . Pulmonary hypertension ([place])   . Urinary incontinence   . UTI (urinary tract infection)     HOSPITAL COURSE:  *Acute respiratory failure with hypoxia due to pneumonia and pulmonary edema Resolved Most likely secondary to acute fluid overload state from end-stage renal disease/diastolic heart failure Pneumonia ruled out  Patient assess for home  oxygen-we will require 2 L via nasal cannula continuous at home  *Acute fluid overload state  Resolved Secondary to end-stage renal disease  Case discussed with nephrology, given multiple comorbidities and poor prognosis going forward-palliative care did see patient while in house, patient and the patient's family are agreeable for outpatient continued palliative care services  Nephrology consulted for hemodialysis needs  *Chronic end-stage renal disease  Plan of care as stated above  *Acute E. coli and Klebsiella UTI Resolving  Treated initially with Rocephin, will complete course of antibiotics with cefdinir, sensitivities noted   *Acute elevated troponins  Most likely secondary to chronic renal disease  Conservative medical management  *Chronic diabetes mellitus type 2  Stable Sliding scale insulin with active checks per routine  *Chronic benign essential hypertension  Stable  Continued current regiment  *Chronic deconditioning, adult failure to thrive  Provided increased nursing care PRN, aspiration/fall/skin care precautions while in house, Dietary  did see patient while in house as well as palliative care services   *Acute on chronic severe protein energy/calorie malnutrition Dietary consulted  DISCHARGE CONDITIONS:   Stable, but with poor long term prognosis  CONSULTS OBTAINED:  Treatment Team:  Anthonette Legato, MD  DRUG ALLERGIES:   Allergies  Allergen Reactions  . Nitrofurantoin Itching  . Ace Inhibitors   . Captopril Other (  See Comments)    REACTION: unspecified  . Enalapril Maleate Cough  . Ramipril Other (See Comments)    REACTION: unspecified  . Sulfa Antibiotics Other (See Comments)    Reaction unknown  . Verapamil Other (See Comments)    REACTION: unspecified    DISCHARGE MEDICATIONS:   Allergies as of 06/30/2017      Reactions   Nitrofurantoin Itching   Ace Inhibitors    Captopril Other (See Comments)   REACTION: unspecified    Enalapril Maleate Cough   Ramipril Other (See Comments)   REACTION: unspecified   Sulfa Antibiotics Other (See Comments)   Reaction unknown   Verapamil Other (See Comments)   REACTION: unspecified      Medication List    TAKE these medications   acetaminophen 325 MG tablet Commonly known as:  TYLENOL Take 1 tablet (325 mg total) by mouth every 6 (six) hours as needed for mild pain or fever (or Fever >/= 101).   albuterol (2.5 MG/3ML) 0.083% nebulizer solution Commonly known as:  PROVENTIL Take 3 mLs (2.5 mg total) by nebulization every 2 (two) hours as needed for wheezing.   amLODipine 10 MG tablet Commonly known as:  NORVASC Take 1 tablet (10 mg total) by mouth daily.   ascorbic acid 500 MG tablet Commonly known as:  VITAMIN C Take 1 tablet (500 mg total) by mouth daily. What changed:  when to take this   calcium carbonate 500 MG chewable tablet Commonly known as:  TUMS - dosed in mg elemental calcium Chew 1 tablet (200 mg of elemental calcium total) by mouth 3 (three) times daily with meals.   cefdinir 300 MG capsule Commonly known as:  OMNICEF Take 1 capsule (300 mg total) by mouth every 12 (twelve) hours.   CEROVITE SENIOR Tabs Take 1 tablet by mouth daily.   epoetin alfa 10000 UNIT/ML injection Commonly known as:  EPOGEN,PROCRIT Inject 1 mL (10,000 Units total) into the vein every Monday, Wednesday, and Friday with hemodialysis.   feeding supplement (ENSURE ENLIVE) Liqd Take 237 mLs by mouth 3 (three) times daily between meals.   feeding supplement (PRO-STAT SUGAR FREE 64) Liqd Take 30 mLs by mouth 3 (three) times daily with meals.   folic acid 0.5 MG tablet Commonly known as:  FOLVITE Take 0.5 tablets (0.5 mg total) by mouth daily.   Ganciclovir 0.15 % Gel Commonly known as:  ZIRGAN Place 1 drop into the right eye 4 (four) times daily.   hydrALAZINE 25 MG tablet Commonly known as:  APRESOLINE Take 25 mg by mouth 4 (four) times daily.    HYDROcodone-acetaminophen 5-325 MG tablet Commonly known as:  NORCO/VICODIN Take 1-2 tablets by mouth every 4 (four) hours as needed for moderate pain.   insulin aspart 100 UNIT/ML injection Commonly known as:  novoLOG Inject 0-9 Units into the skin 3 (three) times daily with meals. CBG < 70: implement hypoglycemia protocol CBG 70 - 120: 0 units CBG 121 - 150: 1 unit CBG 151 - 200: 2 units CBG 201 - 250: 3 units CBG 251 - 300: 5 units CBG 301 - 350: 7 units CBG 351 - 400: 9 units CBG > 400: call MD and obtain STAT lab verification   levothyroxine 75 MCG tablet Commonly known as:  SYNTHROID, LEVOTHROID TAKE ONE TABLET BY MOUTH ONCE DAILY   metoprolol tartrate 50 MG tablet Commonly known as:  LOPRESSOR TAKE ONE TABLET BY MOUTH TWICE DAILY What changed:    how much to take  how  to take this  when to take this   multivitamin Tabs tablet Take 1 tablet by mouth at bedtime.   ondansetron 4 MG tablet Commonly known as:  ZOFRAN Take 1 tablet (4 mg total) by mouth every 6 (six) hours as needed for nausea.   pantoprazole 40 MG tablet Commonly known as:  PROTONIX Take 1 tablet (40 mg total) by mouth 2 (two) times daily before a meal.   polyethylene glycol packet Commonly known as:  MIRALAX / GLYCOLAX Take 17 g by mouth daily as needed for mild constipation. What changed:  when to take this   polyvinyl alcohol 1.4 % ophthalmic solution Commonly known as:  LIQUIFILM TEARS Place 1 drop into both eyes as needed for dry eyes.   pravastatin 40 MG tablet Commonly known as:  PRAVACHOL TAKE ONE TABLET BY MOUTH IN THE EVENING What changed:    how much to take  how to take this  when to take this   senna-docusate 8.6-50 MG tablet Commonly known as:  Senokot-S Take 1 tablet by mouth at bedtime as needed for mild constipation.   zinc sulfate 220 (50 Zn) MG capsule Take 220 mg by mouth daily.        DISCHARGE INSTRUCTIONS:  If you experience worsening of your  admission symptoms, develop shortness of breath, life threatening emergency, suicidal or homicidal thoughts you must seek medical attention immediately by calling 911 or calling your MD immediately  if symptoms less severe.  You Must read complete instructions/literature along with all the possible adverse reactions/side effects for all the Medicines you take and that have been prescribed to you. Take any new Medicines after you have completely understood and accept all the possible adverse reactions/side effects.   Please note  You were cared for by a hospitalist during your hospital stay. If you have any questions about your discharge medications or the care you received while you were in the hospital after you are discharged, you can call the unit and asked to speak with the hospitalist on call if the hospitalist that took care of you is not available. Once you are discharged, your primary care physician will handle any further medical issues. Please note that NO REFILLS for any discharge medications will be authorized once you are discharged, as it is imperative that you return to your primary care physician (or establish a relationship with a primary care physician if you do not have one) for your aftercare needs so that they can reassess your need for medications and monitor your lab values.    Today   CHIEF COMPLAINT:   Chief Complaint  Patient presents with  . Nasal Congestion  . Weakness    HISTORY OF PRESENT ILLNESS:  84 y.o. female with a known history of multiple medical problems including ESRD on hemodialysis, hypertension, Chronic diastolic CHF, diabetes, CAD, UTI, hyperlipidemia, PAD, pulmonary hypertension and hypothyroidism.  The patient has had cough, sputum and the shortness of breath for a few days.  In addition, she has generalized weakness.  [facility].  She is found hypoxia and put on oxygen by nasal cannula 2 L in the ED.   Chest x-ray show pulmonary congestion and possible pneumonia.  Urinalysis showed UTI.  The patient is very hard of hearing.  Information is obtained from her son. VITAL SIGNS:  Blood pressure (!) 154/51, pulse 92, temperature 97.9 F (36.6 C), temperature source Oral, resp. rate (!) 28, height 5'  2" (1.575 m), weight 44 kg (97 lb), SpO2 97 %.  I/O:    Intake/Output Summary (Last 24 hours) at 06/30/2017 1241 Last data filed at 06/30/2017 0530 Gross per 24 hour  Intake 0 ml  Output -  Net 0 ml    PHYSICAL EXAMINATION:  GENERAL:  84 y.o.-year-old patient lying in the bed with no acute distress.  EYES: Pupils equal, round, reactive to light and accommodation. No scleral icterus. Extraocular muscles intact.  HEENT: Head atraumatic, normocephalic. Oropharynx and nasopharynx clear.  NECK:  Supple, no jugular venous distention. No thyroid enlargement, no tenderness.  LUNGS: Normal breath sounds bilaterally, no wheezing, rales,rhonchi or crepitation. No use of accessory muscles of respiration.  CARDIOVASCULAR: S1, S2 normal. No murmurs, rubs, or gallops.  ABDOMEN: Soft, non-tender, non-distended. Bowel sounds present. No organomegaly or mass.  EXTREMITIES: No pedal edema, cyanosis, or clubbing.  NEUROLOGIC: Cranial nerves II through XII are intact. Muscle strength 5/5 in all extremities. Sensation intact. Gait not checked.  PSYCHIATRIC: The patient is alert and oriented x 3.  SKIN: No obvious rash, lesion, or ulcer.   DATA REVIEW:   CBC Recent Labs  Lab 06/28/17 1345  WBC 7.3  HGB 10.7*  HCT 32.5*  PLT 154    Chemistries  Recent Labs  Lab 06/27/17 1357  06/28/17 1345  NA 133*   < > 132*  K 3.9   < > 4.2  CL 93*   < > 95*  CO2 26   < > 23  GLUCOSE 119*   < > 116*  BUN 34*   < > 41*  CREATININE 3.99*   < > 4.79*  CALCIUM 9.0   < > 8.2*  AST 24  --   --   ALT 13*  --   --   ALKPHOS 177*  --   --   BILITOT 1.2  --   --    < > = values in this interval not displayed.     Cardiac Enzymes Recent Labs  Lab 06/27/17 1357  TROPONINI 0.04*    Microbiology Results  Results for orders placed or performed during the hospital encounter of 06/27/17  Blood Culture (routine x 2)     Status: None (Preliminary result)   Collection Time: 06/27/17  1:59 PM  Result Value Ref Range Status   Specimen Description BLOOD RIGHT ARM  Final   Special Requests   Final    BOTTLES DRAWN AEROBIC AND ANAEROBIC Blood Culture adequate volume   Culture   Final    NO GROWTH 3 DAYS Performed at [facility], [address] [place], [place] [address]    Report Status PENDING  Incomplete  Blood Culture (routine x 2)     Status: None (Preliminary result)   Collection Time: 06/27/17  1:59 PM  Result Value Ref Range Status   Specimen Description BLOOD RIGHT ARM  Final   Special Requests   Final    BOTTLES DRAWN AEROBIC AND ANAEROBIC Blood Culture adequate volume   Culture   Final    NO GROWTH 3 DAYS Performed at [facility], [address] [place], [place] [address]    Report Status PENDING  Incomplete  Urine culture     Status: Abnormal   Collection Time: 06/27/17  3:05 PM  Result Value Ref Range Status   Specimen Description   Final    URINE, RANDOM Performed at [facility], [address] [place], [place] [address]    Special  Requests   Final    NONE Performed at [facility], [address] [place], [place] [address]    Culture (A)  Final    >=100,000 COLONIES/mL ESCHERICHIA COLI >=100,000 COLONIES/mL KLEBSIELLA ORNITHINOLYTICA    Report Status 06/30/2017 FINAL  Final   Organism ID, Bacteria ESCHERICHIA COLI (A)  Final   Organism ID, Bacteria KLEBSIELLA ORNITHINOLYTICA (A)  Final      Susceptibility   Escherichia coli - MIC*    AMPICILLIN >=32 RESISTANT Resistant     CEFAZOLIN 8 SENSITIVE Sensitive     CEFTRIAXONE <=1 SENSITIVE Sensitive     CIPROFLOXACIN >=4 RESISTANT Resistant     GENTAMICIN >=16 RESISTANT  Resistant     IMIPENEM <=0.25 SENSITIVE Sensitive     NITROFURANTOIN 32 SENSITIVE Sensitive     TRIMETH/SULFA <=20 SENSITIVE Sensitive     AMPICILLIN/SULBACTAM >=32 RESISTANT Resistant     PIP/TAZO <=4 SENSITIVE Sensitive     Extended ESBL NEGATIVE Sensitive     * >=100,000 COLONIES/mL ESCHERICHIA COLI   Klebsiella ornithinolytica - MIC*    AMPICILLIN >=32 RESISTANT Resistant     CEFAZOLIN <=4 SENSITIVE Sensitive     CEFTRIAXONE <=1 SENSITIVE Sensitive     CIPROFLOXACIN <=0.25 SENSITIVE Sensitive     GENTAMICIN <=1 SENSITIVE Sensitive     IMIPENEM <=0.25 SENSITIVE Sensitive     NITROFURANTOIN 32 SENSITIVE Sensitive     TRIMETH/SULFA <=20 SENSITIVE Sensitive     AMPICILLIN/SULBACTAM 4 SENSITIVE Sensitive     PIP/TAZO <=4 SENSITIVE Sensitive     * >=100,000 COLONIES/mL KLEBSIELLA ORNITHINOLYTICA  MRSA PCR Screening     Status: None   Collection Time: 06/27/17  4:54 PM  Result Value Ref Range Status   MRSA by PCR NEGATIVE NEGATIVE Final    Comment:        The GeneXpert MRSA Assay (FDA approved for NASAL specimens only), is one component of a comprehensive MRSA colonization surveillance program. It is not intended to diagnose MRSA infection nor to guide or monitor treatment for MRSA infections. Performed at [facility], [address], [place], [place] [address]     RADIOLOGY:  No results found.  EKG:   Orders placed or performed during the hospital encounter of 06/27/17  . ED EKG 12-Lead  . ED EKG 12-Lead  . EKG 12-Lead  . EKG 12-Lead      Management plans discussed with the patient, family and they are in agreement.  CODE STATUS:     Code Status Orders  (From admission, onward)        Start     Ordered   06/27/17 1642  Do not attempt resuscitation (DNR)  Continuous    Question Answer Comment  In the event of cardiac or respiratory ARREST Do not call a "code blue"   In the event of cardiac or respiratory ARREST Do not perform Intubation, CPR,  defibrillation or ACLS   In the event of cardiac or respiratory ARREST Use medication by any route, position, wound care, and other measures to relive pain and suffering. May use oxygen, suction and manual treatment of airway obstruction as needed for comfort.      06/27/17 1641    Code Status History    Date Active Date Inactive Code Status Order ID Comments User Context   06/27/2017 1547 06/27/2017 1641 DNR [id number]  Merlyn Lot, MD ED   05/17/2017 1347 05/26/2017 2115 DNR [id number]  Bettey Costa, MD Inpatient   05/05/2017 1547 05/07/2017 2121 DNR [id number]  Hillary Bow, MD ED   04/26/2017 1709 04/28/2017 2046 DNR [id number]  Demetrios Loll, MD Inpatient   01/03/2017 1328 01/08/2017 2014 DNR [id number]  Idelle Crouch, MD Inpatient   01/03/2017 1031 01/03/2017 1328 DNR [id number]  Merlyn Lot, MD ED   09/18/2015 1817 09/22/2015 1920 DNR [id number]  Fritzi Mandes, MD Inpatient   09/18/2015 1516 09/18/2015 1817 DNR [id number]  Flora Lipps, MD ED   03/22/2013 2100 03/26/2013 1540 Full Code [id number]  Rise Patience, MD Inpatient   11/20/2012 1756 12/06/2012 2008 DNR [id number]  Annita Brod, MD Inpatient   06/18/2011 1042 06/19/2011 1401 Full Code [id number]  Paulene Floor, RN Inpatient   12/14/2010 1124 12/29/2010 1543 DNR [id number]  Bonnielee Haff, MD Inpatient   12/13/2010 0113 12/14/2010 1123 Full Code [id number]  Sid Falcon, RN Inpatient   11/22/2010 0216 11/28/2010 2239 DNR [id number]  Viviano Simas, RN Inpatient    Advance Directive Documentation     Most Recent Value  Type of Advance Directive  Healthcare Power of Attorney, Living will  Pre-existing out of facility DNR order (yellow form or pink MOST form)  -  "MOST" Form in Place?  -      TOTAL TIME TAKING CARE OF THIS PATIENT: 45 minutes.    Avel Peace Salary M.D on 06/30/2017 at 12:41 PM  Between 7am to 6pm - Pager - [phone]  After 6pm go to [email or web address] - password [email or web address]  [facility]  Office  [phone]  CC: Primary care physician; [name], MD   Note: This dictation was prepared with Dragon dictation along with smaller phrase technology. Any transcriptional errors that result from this process are unintentional.

## 2017-06-30 NOTE — Care Management Note (Signed)
Case Management Note  Patient Details  Name: [name] MRN: [id number] Date of Birth: 06-28-1920   Patient to discharge back to [facility] today.  CSW facilitating.  Resumption orders have been placed for home health. Corene Cornea with [facility] notified of discharge. Patient will require 2 L of o2 at discharge.  Corene Cornea with [facility] notified of referral. Per CSW she will notify son of need for O2, when she speaks to him regarding discharge.   Subjective/Objective:                    Action/Plan:   Expected Discharge Date:  06/30/17               Expected Discharge Plan:  Assisted Living / Rest Home(with home health )  In-House Referral:     Discharge planning Services  CM Consult  Post Acute Care Choice:  Durable Medical Equipment, Home Health Choice offered to:     DME Arranged:  Oxygen DME Agency:  [place]:  RN, PT, Nurse's Aide, Social Work [employer] Agency:  [place]  Status of Service:  Completed, signed off  If discussed at [employer], dates discussed:    Additional Comments:  Beverly Sessions, RN 06/30/2017, 2:18 PM

## 2017-06-30 NOTE — Telephone Encounter (Signed)
Pt was schedule for an OV tomorrow as a F/U from rehab then pt went back in the hospital. Spoke to pt's son. He said he did not see a reason to bring her in tomorrow. She has been sleeping a lot lately and did not think she would be alert enough for the appointment. He is getting her settled at [facility]. He said if Dr Silvio Pate thinks she needs an OV, he will reschedule.

## 2017-06-30 NOTE — Consult Note (Signed)
[facility] Vascular Consult Note  MRN : [id number]  [name] is a 84 y.o. (04/16/1920) female who presents with chief complaint of  Chief Complaint  Patient presents with  . Nasal Congestion  . Weakness  .  History of Present Illness:   I am asked to evaluate the patient by Dr Jerelyn Charles.  Currently the patient is maintained via a left brachial cephalic fistula.    The patient has a significant increase in bleeding both during and after decannulation.  There is no history of hand pain or other symptoms consistent with steal phenomena.  No significant arm swelling.  No redness or swelling at the access site. The patient denies fever or chills at home or while on dialysis.   Current Facility-Administered Medications  Medication Dose Route Frequency Provider Last Rate Last Dose  . 0.9 %  sodium chloride infusion  250 mL Intravenous PRN Demetrios Loll, MD   Stopped at 06/28/17 1900  . acetaminophen (TYLENOL) tablet 650 mg  650 mg Oral Q6H PRN Demetrios Loll, MD       Or  . acetaminophen (TYLENOL) suppository 650 mg  650 mg Rectal Q6H PRN Demetrios Loll, MD      . albuterol (PROVENTIL) (2.5 MG/3ML) 0.083% nebulizer solution 2.5 mg  2.5 mg Nebulization Q2H PRN Demetrios Loll, MD   2.5 mg at 06/30/17 1917  . amLODipine (NORVASC) tablet 10 mg  10 mg Oral Daily Demetrios Loll, MD   10 mg at 06/29/17 1133  . bisacodyl (DULCOLAX) EC tablet 5 mg  5 mg Oral Daily PRN Demetrios Loll, MD   5 mg at 06/27/17 1751  . calcium carbonate (TUMS - dosed in mg elemental calcium) chewable tablet 200 mg of elemental calcium  1 tablet Oral TID WC Demetrios Loll, MD   200 mg of elemental calcium at 06/29/17 0856  . cefdinir (OMNICEF) capsule 300 mg  300 mg Oral Q12H Salary, Montell D, MD   300 mg at 06/29/17 1133  . feeding supplement (ENSURE ENLIVE) (ENSURE ENLIVE) liquid 237 mL  237 mL Oral TID BM Salary, Montell D, MD   237 mL at 06/29/17 2225  . feeding supplement (PRO-STAT SUGAR FREE 64) liquid 30 mL  30 mL  Oral TID WC Demetrios Loll, MD   30 mL at 06/29/17 1131  . folic acid (FOLVITE) tablet 0.5 mg  0.5 mg Oral Daily Demetrios Loll, MD   0.5 mg at 06/29/17 1132  . Ganciclovir (ZIRGAN) 0.15 % ophthalmic gel 1 drop  1 drop Right Eye QID Demetrios Loll, MD   1 drop at 06/30/17 1056  . guaiFENesin-dextromethorphan (ROBITUSSIN DM) 100-10 MG/5ML syrup 5 mL  5 mL Oral Q4H PRN Demetrios Loll, MD   5 mL at 06/27/17 1751  . heparin injection 5,000 Units  5,000 Units Subcutaneous Q8H Demetrios Loll, MD   5,000 Units at 06/30/17 [phone]  . hydrALAZINE (APRESOLINE) tablet 25 mg  25 mg Oral QID Demetrios Loll, MD   25 mg at 06/29/17 1459  . HYDROcodone-acetaminophen (NORCO/VICODIN) 5-325 MG per tablet 1-2 tablet  1-2 tablet Oral Q4H PRN Demetrios Loll, MD      . insulin aspart (novoLOG) injection 0-5 Units  0-5 Units Subcutaneous QHS Demetrios Loll, MD      . insulin aspart (novoLOG) injection 0-9 Units  0-9 Units Subcutaneous TID WC Demetrios Loll, MD   1 Units at 06/29/17 1744  . ipratropium-albuterol (DUONEB) 0.5-2.5 (3) MG/3ML nebulizer solution 3 mL  3 mL Nebulization Q6H  PRN Salary, Avel Peace, MD      . levothyroxine (SYNTHROID, LEVOTHROID) tablet 75 mcg  75 mcg Oral Daily Demetrios Loll, MD   75 mcg at 06/29/17 1132  . metoprolol tartrate (LOPRESSOR) tablet 50 mg  50 mg Oral BID Demetrios Loll, MD   50 mg at 06/29/17 1133  . multivitamin (RENA-VIT) tablet 1 tablet  1 tablet Oral QHS Loney Hering D, MD   1 tablet at 06/28/17 2034  . ondansetron (ZOFRAN) tablet 4 mg  4 mg Oral Q6H PRN Demetrios Loll, MD       Or  . ondansetron [facility]) injection 4 mg  4 mg Intravenous Q6H PRN Demetrios Loll, MD   4 mg at 06/27/17 1836  . pantoprazole (PROTONIX) EC tablet 40 mg  40 mg Oral BID Otho Perl, MD   40 mg at 06/29/17 0856  . polyethylene glycol (MIRALAX / GLYCOLAX) packet 17 g  17 g Oral Daily Demetrios Loll, MD   17 g at 06/29/17 1131  . polyvinyl alcohol (LIQUIFILM TEARS) 1.4 % ophthalmic solution 1 drop  1 drop Both Eyes PRN Demetrios Loll, MD   1 drop at 06/29/17 1131   . pravastatin (PRAVACHOL) tablet 40 mg  40 mg Oral Daily Demetrios Loll, MD   40 mg at 06/29/17 1133  . senna-docusate (Senokot-S) tablet 1 tablet  1 tablet Oral QHS PRN Demetrios Loll, MD      . sodium chloride flush (NS) 0.9 % injection 3 mL  3 mL Intravenous Q12H Demetrios Loll, MD   3 mL at 06/30/17 1056  . sodium chloride flush (NS) 0.9 % injection 3 mL  3 mL Intravenous PRN Demetrios Loll, MD      . vitamin C (ASCORBIC ACID) tablet 500 mg  500 mg Oral BID Demetrios Loll, MD   500 mg at 06/29/17 1133  . zinc sulfate capsule 220 mg  220 mg Oral Daily Demetrios Loll, MD   220 mg at 06/29/17 1133    Past Medical History:  Diagnosis Date  . Anemia    NOS  . Anxiety   . Aortic stenosis   . Cancer [facility])    Mole on top of head to be removed in July 2013  . Chronic diastolic CHF (congestive heart failure) ([place])    a. 01/2017 Echo: EF 60-65%, no rwma, Gr2 DD, mild to mod MR, mildly dil LA/RA, nl RV fxn, Sev TR, PASP 56mmHg.  . Constipation   . Coronary artery disease    a. 2009 CABG x 3 w/ bioprosthetic AVR.  . Diabetes mellitus    type II;was on Glipizide but has been off x 42mon  . Dialysis patient ([place])   . Diarrhea   . ESRD (end stage renal disease) ([place])   . GERD (gastroesophageal reflux disease)   . History of blood transfusion   . Hyperlipidemia    takes Simvastatin nightly  . Hypertension    takes Metoprolol daily  . Hypothyroidism    takes Synthroid daily  . Migraine    hx of  . Oligouria   . Osteoarthritis   . Osteopenia   . Peripheral vascular disease ([place])   . Personal history of colonic polyps   . Pulmonary hypertension ([place])   . Urinary incontinence   . UTI (urinary tract infection)     Past Surgical History:  Procedure Laterality Date  . A/V FISTULAGRAM [name] 01/07/2017   Procedure: A/V Fistulagram;  Surgeon: Algernon Huxley, MD;  Location: [place] [facility];  Service: Cardiovascular;  Laterality: [name];  . A/V SHUNT INTERVENTION [name] 01/07/2017   Procedure: A/V SHUNT INTERVENTION;   Surgeon: Algernon Huxley, MD;  Location: [place] [facility];  Service: Cardiovascular;  Laterality: [name];  . ABDOMINAL HYSTERECTOMY    . adenosine myoview  2007   benign, EF 69%  . AMPUTATION  06/18/2011   Procedure: AMPUTATION DIGIT;  Surgeon: Newt Minion, MD;  Location: [place];  Service: Orthopedics;  Laterality: Left;  Left 2nd Toe Amputation at MTP Joint  . Amputation-left great toe  7/12   Dr Marlou Sa  . AORTIC VALVE REPLACEMENT  2009  . APPENDECTOMY    . AV FISTULA PLACEMENT  12/22/2010   Procedure: ARTERIOVENOUS (AV) FISTULA CREATION;  Surgeon: Hinda Lenis, MD;  Location: [place];  Service: Vascular;  Laterality: Left;  Creation of Left Brachial-Cephalic Fistula  . CARDIAC CATHETERIZATION  2000   cad  . CAROTID ENDARTERECTOMY  2011   Right  . CATARACT EXTRACTION    . COLONOSCOPY  05/21/2017   Procedure: COLONOSCOPY;  Surgeon: Toledo, Benay Pike, MD;  Location: [facility];  Service: Gastroenterology;;  . CORONARY ARTERY BYPASS GRAFT     od  . CORONARY ARTERY BYPASS GRAFT  2009  . ESOPHAGOGASTRODUODENOSCOPY [name] 05/21/2017   Procedure: ESOPHAGOGASTRODUODENOSCOPY (EGD);  Surgeon: Toledo, Benay Pike, MD;  Location: [facility];  Service: Gastroenterology;  Laterality: [name];  . EYE SURGERY     BIlateral  . INSERTION OF DIALYSIS CATHETER  12/18/2010   Procedure: INSERTION OF DIALYSIS CATHETER;  Surgeon: Mal Misty, MD;  Location: [place];  Service: Vascular;  Laterality: Right;  . REPLACEMENT TOTAL KNEE BILATERAL  05/1998  . TONSILLECTOMY    . Traumatic Amputation of Right DIP joint of Index Finger      Social History Social History   Tobacco Use  . Smoking status: Former Smoker    Years: 15.00    Types: Cigarettes    Last attempt to quit: 01/06/1943    Years since quitting: 74.5  . Smokeless tobacco: Never Used  Substance Use Topics  . Alcohol use: Yes    Comment: Wine occasionally  . Drug use: No    Family History Family History  Family history unknown: Yes  No  family history of bleeding/clotting disorders, porphyria or autoimmune disease   Allergies  Allergen Reactions  . Nitrofurantoin Itching  . Ace Inhibitors   . Captopril Other (See Comments)    REACTION: unspecified  . Enalapril Maleate Cough  . Ramipril Other (See Comments)    REACTION: unspecified  . Sulfa Antibiotics Other (See Comments)    Reaction unknown  . Verapamil Other (See Comments)    REACTION: unspecified     REVIEW OF SYSTEMS (Patient is lethargic and unable to give a review of systems)   Physical Examination  Vitals:   06/30/17 0447 06/30/17 1354 06/30/17 1608 06/30/17 1918  BP: (!) 154/51 (!) 173/64 139/61   Pulse: 92 93 93   Resp: (!) 28 16 20    Temp: 97.9 F (36.6 C) 97.8 F (36.6 C) 98.1 F (36.7 C)   TempSrc: Oral Oral Oral   SpO2: 97% 98% 98% 97%  Weight:      Height:       Body mass index is 17.74 kg/m.  Head: /AT, No temporalis wasting.  Ear/Nose/Throat: Nares w/o erythema or drainage  Neck: No bruit or JVD.  Pulmonary:  Breath sounds equal bilaterally, no use of accessory muscles.  Cardiac: RRR, normal S1, S2,  no Murmurs, rubs or gallops. Vascular: left arm brachial cephalic fistula + thrill +bruit Gastrointestinal: soft, non-tender, non-distended.  Musculoskeletal:  No deformity. No edema. Dermatologic: No rashes or ulcers noted.  No cellulitis or open wounds. Lymph : No Cervical,  or Inguinal lymphadenopathy.      CBC Lab Results  Component Value Date   WBC 5.9 06/30/2017   HGB 11.5 (L) 06/30/2017   HCT 34.6 (L) 06/30/2017   MCV 96.6 06/30/2017   PLT 193 06/30/2017    BMET    Component Value Date/Time   NA 136 06/30/2017 1654   NA 134 (L) 03/22/2013 1456   K 4.7 06/30/2017 1654   K 3.6 03/22/2013 1456   CL 100 06/30/2017 1654   CL 97 (L) 03/22/2013 1456   CO2 23 06/30/2017 1654   CO2 28 03/22/2013 1456   GLUCOSE 115 (H) 06/30/2017 1654   GLUCOSE 86 03/22/2013 1456   GLUCOSE 144 (H) 01/13/2006 0000   BUN 37 (H)  06/30/2017 1654   BUN 20 (H) 03/22/2013 1456   CREATININE 4.37 (H) 06/30/2017 1654   CREATININE 1.77 (H) 03/22/2013 1456   CALCIUM 8.6 (L) 06/30/2017 1654   CALCIUM 8.8 03/22/2013 1456   GFRNONAA 8 (L) 06/30/2017 1654   GFRNONAA 25 (L) 03/22/2013 1456   GFRAA 9 (L) 06/30/2017 1654   GFRAA 29 (L) 03/22/2013 1456   Estimated Creatinine Clearance: 5.3 mL/min (A) (by C-G formula based on SCr of 4.37 mg/dL (H)).  COAG Lab Results  Component Value Date   INR 1.13 05/17/2017   INR 1.27 05/05/2017   INR 1.00 05/03/2015     Assessment/Plan 1.  Complication of Dialysis Access: Recommend:  The patient is experiencing increasing problems with their dialysis access.  Patient should have a fistulagram with the intention for intervention.  The intention for intervention is to restore appropriate flow and prevent thrombosis and possible loss of the access.  As well as improve the quality of dialysis therapy.  The risks, benefits and alternative therapies were reviewed in detail with the patient.  All questions were answered.  The patient agrees to proceed with angio/intervention.    2.  Lethargy: The Patient is increasingly lethargic, CT scan of the head is negative at this time.  Continue work up per Crystal, MD  06/30/2017 8:22 PM

## 2017-06-30 NOTE — Progress Notes (Signed)
SATURATION QUALIFICATIONS: (This note is used to comply with regulatory documentation for home oxygen)  Patient Saturations on Room Air at Rest 84 %   

## 2017-06-30 NOTE — Progress Notes (Signed)
[facility]  ROUNDING NOTE   Subjective:   Pleasantly confused.   Hemodialysis treatment for later today.   Objective:  Vital signs in last 24 hours:  Temp:  [97.9 F (36.6 C)-98.2 F (36.8 C)] 97.9 F (36.6 C) (06/26 0447) Pulse Rate:  [92-95] 92 (06/26 0447) Resp:  [24-30] 28 (06/26 0447) BP: (148-154)/(51-57) 154/51 (06/26 0447) SpO2:  [91 %-97 %] 97 % (06/26 0447)  Weight change:  Filed Weights   06/27/17 1347 06/28/17 1104 06/28/17 1409  Weight: 44 kg (97 lb) 44.5 kg (98 lb 1.7 oz) 44 kg (97 lb)    Intake/Output: I/O last 3 completed shifts: In: 120 [P.O.:120] Out: -    Intake/Output this shift:  No intake/output data recorded.  Physical Exam: General: NAD, laying in bed  Head: Normocephalic, atraumatic. Moist oral mucosal membranes  Eyes: Anicteric, PERRL  Neck: Supple, trachea midline  Lungs:  Clear to auscultation  Heart: Regular rate and rhythm  Abdomen:  Soft, nontender,   Extremities: no peripheral edema.  Neurologic: Alert to self  Skin: No lesions  Access: Left AVF    Basic Metabolic Panel: Recent Labs  Lab 06/27/17 1357 06/28/17 0418 06/28/17 1345  NA 133* 134* 132*  K 3.9 4.2 4.2  CL 93* 97* 95*  CO2 26 25 23   GLUCOSE 119* 80 116*  BUN 34* 39* 41*  CREATININE 3.99* 4.50* 4.79*  CALCIUM 9.0 8.2* 8.2*  PHOS  --   --  5.1*    Liver Function Tests: Recent Labs  Lab 06/27/17 1357 06/28/17 1345  AST 24  --   ALT 13*  --   ALKPHOS 177*  --   BILITOT 1.2  --   PROT 8.2*  --   ALBUMIN 3.3* 2.7*   No results for input(s): LIPASE, AMYLASE in the last 168 hours. No results for input(s): AMMONIA in the last 168 hours.  CBC: Recent Labs  Lab 06/27/17 1357 06/28/17 0418 06/28/17 1345  WBC 10.4 7.5 7.3  NEUTROABS 8.6*  --  6.0  HGB 12.4 10.6* 10.7*  HCT 37.9 31.7* 32.5*  MCV 96.5 96.3 96.4  PLT 179 147* 154    Cardiac Enzymes: Recent Labs  Lab 06/27/17 1357  TROPONINI 0.04*    BNP: Invalid input(s):  POCBNP  CBG: Recent Labs  Lab 06/29/17 0735 06/29/17 1139 06/29/17 1642 06/29/17 2142 06/30/17 0757  GLUCAP 100* 111* 149* 105* 104*    Microbiology: Results for orders placed or performed during the hospital encounter of 06/27/17  Blood Culture (routine x 2)     Status: None (Preliminary result)   Collection Time: 06/27/17  1:59 PM  Result Value Ref Range Status   Specimen Description BLOOD RIGHT ARM  Final   Special Requests   Final    BOTTLES DRAWN AEROBIC AND ANAEROBIC Blood Culture adequate volume   Culture   Final    NO GROWTH 3 DAYS Performed at [facility], [address], [place], [place] [address]    Report Status PENDING  Incomplete  Blood Culture (routine x 2)     Status: None (Preliminary result)   Collection Time: 06/27/17  1:59 PM  Result Value Ref Range Status   Specimen Description BLOOD RIGHT ARM  Final   Special Requests   Final    BOTTLES DRAWN AEROBIC AND ANAEROBIC Blood Culture adequate volume   Culture   Final    NO GROWTH 3 DAYS Performed at [facility], [address], [place], [place] [address]  Report Status PENDING  Incomplete  Urine culture     Status: None (Preliminary result)   Collection Time: 06/27/17  3:05 PM  Result Value Ref Range Status   Specimen Description   Final    URINE, RANDOM Performed at [facility], [address] [place], [place] [address]    Special Requests   Final    NONE Performed at [facility], [address], [place], [place] [address]    Culture   Final    CULTURE REINCUBATED FOR BETTER GROWTH Performed at [facility], [place] [address] [place], [place] [address]    Report Status PENDING  Incomplete  MRSA PCR Screening     Status: None   Collection Time: 06/27/17  4:54 PM  Result Value Ref Range Status   MRSA by PCR NEGATIVE NEGATIVE Final    Comment:        The GeneXpert MRSA Assay (FDA approved for NASAL specimens only), is one component of  a comprehensive MRSA colonization surveillance program. It is not intended to diagnose MRSA infection nor to guide or monitor treatment for MRSA infections. Performed at [facility], [address] [place], [place] [address]     Coagulation Studies: No results for input(s): LABPROT, INR in the last 72 hours.  Urinalysis: Recent Labs    06/27/17 1505  COLORURINE AMBER*  LABSPEC 1.013  PHURINE 6.0  GLUCOSEU NEGATIVE  HGBUR MODERATE*  BILIRUBINUR NEGATIVE  KETONESUR NEGATIVE  PROTEINUR 100*  NITRITE NEGATIVE  LEUKOCYTESUR MODERATE*      Imaging: No results found.   Medications:   . sodium chloride Stopped (06/28/17 1900)   . amLODipine  10 mg Oral Daily  . calcium carbonate  1 tablet Oral TID WC  . cefdinir  300 mg Oral Q12H  . feeding supplement (ENSURE ENLIVE)  237 mL Oral TID BM  . feeding supplement (PRO-STAT SUGAR FREE 64)  30 mL Oral TID WC  . folic acid  0.5 mg Oral Daily  . Ganciclovir  1 drop Right Eye QID  . heparin  5,000 Units Subcutaneous Q8H  . hydrALAZINE  25 mg Oral QID  . insulin aspart  0-5 Units Subcutaneous QHS  . insulin aspart  0-9 Units Subcutaneous TID WC  . ipratropium-albuterol  3 mL Nebulization Q6H  . levothyroxine  75 mcg Oral Daily  . metoprolol tartrate  50 mg Oral BID  . multivitamin  1 tablet Oral QHS  . pantoprazole  40 mg Oral BID AC  . polyethylene glycol  17 g Oral Daily  . pravastatin  40 mg Oral Daily  . sodium chloride flush  3 mL Intravenous Q12H  . ascorbic acid  500 mg Oral BID  . zinc sulfate  220 mg Oral Daily   sodium chloride, acetaminophen **OR** acetaminophen, albuterol, bisacodyl, guaiFENesin-dextromethorphan, HYDROcodone-acetaminophen, ondansetron **OR** ondansetron (ZOFRAN) IV, polyvinyl alcohol, senna-docusate, sodium chloride flush  Assessment/ Plan:  [name] is a 84 y.o. white female with end stage renal disease on hemodialysis, coronary artery disease, history of bilateral knee  replacement, aortic valve replacement 2009, diabetes, hypertension, hypothyroidism, peripheral vascular disease, pulmonary hypertension, anxiety  [facility] [address]/MWF  1. End Stage Renal Disease:  Continue MWF schedule. Dialysis for later today. Orders prepared.   2.  Anemia of chronic kidney disease: hemoglobin 10.7 - mircera as outpatient   3. Secondary Hyperparathyroidism: phosphorus and calcium at goal.  - tums with meals.   4. Hypertension:   - amlodipine,  hydralazine, and metoprolol.    LOS: 3 [name] 6/26/20199:16 AM

## 2017-06-30 NOTE — Clinical Social Work Note (Signed)
Patient is medically ready for discharge. CSW spoke with patient's son Halston Fairclough [phone]. Patient will be discharged back to [facility]. Son states that he will pick up patient around 6pm today to transfer back to facility.   [place], [place]

## 2017-06-30 NOTE — NC FL2 (Signed)
[place] LEVEL OF CARE SCREENING TOOL     IDENTIFICATION  Patient Name: [name] Birthdate: [birth year] Sex: female Admission Date (Current Location): 06/27/2017  [facility] and [place] Number:  Engineering geologist and Address:  [facility], [address] [place], [place] [address]      Provider Number: [id number]  Attending Physician Name and Address:  Gorden Harms, MD  Relative Name and Phone Number:       Current Level of Care: Hospital Recommended Level of Care: [place] Prior Approval Number:    Date Approved/Denied:   PASRR Number:    Discharge Plan: Domiciliary (Rest home)    Current Diagnoses: Patient Active Problem List   Diagnosis Date Noted  . Goals of care, counseling/discussion   . Palliative care by specialist   . Acute respiratory failure with hypoxia ([place]) 06/27/2017  . Pressure injury of skin 05/19/2017  . GIB (gastrointestinal bleeding) 05/17/2017  . GI bleed 05/05/2017  . Pelvic fracture ([place]) 04/26/2017  . Protein-calorie malnutrition, severe 01/07/2017  . CHF (congestive heart failure) ([place]) 01/03/2017  . UTI (urinary tract infection) 01/03/2017  . Acute metabolic encephalopathy 09/38/1829  . Cerebrovascular accident (CVA) due to occlusion of right middle cerebral artery ([place]) 10/24/2015  . Malnutrition of mild degree ([place]) 10/24/2015  . Altered mental status 09/18/2015  . Preventative health care 05/03/2014  . Peripheral vascular disease ([place]) 12/21/2013  . Toe amputation status ([place]) 08/29/2013  . History of recent fall 04/18/2013  . HTN (hypertension), malignant 03/22/2013  . S/P aortic valve replacement with bioprosthetic valve 11/15/2012  . S/P CABG x 3 11/15/2012  . Anemia of chronic kidney failure 12/22/2010  . Thrombocytopenia ([place]) 12/13/2010  . End stage renal disease on dialysis ([place]) 12/13/2010  . S/P aortic valve replacement 08/11/2010  . Arterial  insufficiency-lower 07/25/2010  . NEURODERMATITIS 04/08/2009  . Carotid arterial disease ([place]) 01/01/2009  . Coronary atherosclerosis 02/11/2007  . AORTIC STENOSIS 10/04/2006  . ANXIETY 05/30/2006  . Pulmonary hypertension ([place]) 05/30/2006  . Hypothyroidism 05/27/2006  . Type 2 diabetes mellitus with renal manifestations, controlled ([place]) 05/27/2006  . Hyperlipidemia 05/27/2006  . Essential hypertension 05/27/2006  . GERD 05/27/2006  . OSTEOARTHRITIS 05/27/2006  . OSTEOPENIA 05/27/2006  . URINARY INCONTINENCE 05/27/2006    Orientation RESPIRATION BLADDER Height & Weight     Self, Time, Place  O2(2 liters ) Continent Weight: 97 lb (44 kg) Height:  5\' 2"  (157.5 cm)  BEHAVIORAL SYMPTOMS/MOOD NEUROLOGICAL BOWEL NUTRITION STATUS  (None) (None) Continent Diet(Dysphagia 3 )  AMBULATORY STATUS COMMUNICATION OF NEEDS Skin   Limited Assist Verbally Normal                       Personal Care Assistance Level of Assistance  Bathing, Feeding, Dressing Bathing Assistance: Limited assistance Feeding assistance: Independent Dressing Assistance: Limited assistance     Functional Limitations Info  Sight, Hearing, Speech Sight Info: Adequate Hearing Info: Adequate Speech Info: Adequate    SPECIAL CARE FACTORS FREQUENCY  PT (By licensed PT), OT (By licensed OT)                    Contractures Contractures Info: Not present    Additional Factors Info  Code Status, Allergies Code Status Info: DNR Allergies Info: Enalapril Maleate, Nitrofurantoin, Verapamil, Sulfa Antibiotics, Ramipril, Captopril, Ace Inhibitors           Current Medications (06/30/2017):  This is the current hospital active  medication list TAKE these medications   acetaminophen 325 MG tablet Commonly known as:  TYLENOL Take 1 tablet (325 mg total) by mouth every 6 (six) hours as needed for mild pain or fever (or Fever >/= 101).   albuterol (2.5 MG/3ML) 0.083% nebulizer solution Commonly known as:   PROVENTIL Take 3 mLs (2.5 mg total) by nebulization every 2 (two) hours as needed for wheezing.   amLODipine 10 MG tablet Commonly known as:  NORVASC Take 1 tablet (10 mg total) by mouth daily.   ascorbic acid 500 MG tablet Commonly known as:  VITAMIN C Take 1 tablet (500 mg total) by mouth daily. What changed:  when to take this   calcium carbonate 500 MG chewable tablet Commonly known as:  TUMS - dosed in mg elemental calcium Chew 1 tablet (200 mg of elemental calcium total) by mouth 3 (three) times daily with meals.   cefdinir 300 MG capsule Commonly known as:  OMNICEF Take 1 capsule (300 mg total) by mouth every 12 (twelve) hours.   CEROVITE SENIOR Tabs Take 1 tablet by mouth daily.   epoetin alfa 10000 UNIT/ML injection Commonly known as:  EPOGEN,PROCRIT Inject 1 mL (10,000 Units total) into the vein every Monday, Wednesday, and Friday with hemodialysis.   feeding supplement (ENSURE ENLIVE) Liqd Take 237 mLs by mouth 3 (three) times daily between meals.   feeding supplement (PRO-STAT SUGAR FREE 64) Liqd Take 30 mLs by mouth 3 (three) times daily with meals.   folic acid 0.5 MG tablet Commonly known as:  FOLVITE Take 0.5 tablets (0.5 mg total) by mouth daily.   Ganciclovir 0.15 % Gel Commonly known as:  ZIRGAN Place 1 drop into the right eye 4 (four) times daily.   hydrALAZINE 25 MG tablet Commonly known as:  APRESOLINE Take 25 mg by mouth 4 (four) times daily.   HYDROcodone-acetaminophen 5-325 MG tablet Commonly known as:  NORCO/VICODIN Take 1-2 tablets by mouth every 4 (four) hours as needed for moderate pain.   insulin aspart 100 UNIT/ML injection Commonly known as:  novoLOG Inject 0-9 Units into the skin 3 (three) times daily with meals. CBG < 70: implement hypoglycemia protocol CBG 70 - 120: 0 units CBG 121 - 150: 1 unit CBG 151 - 200: 2 units CBG 201 - 250: 3 units CBG 251 - 300: 5 units CBG 301 - 350: 7 units CBG 351 - 400: 9  units CBG > 400: call MD and obtain STAT lab verification   levothyroxine 75 MCG tablet Commonly known as:  SYNTHROID, LEVOTHROID TAKE ONE TABLET BY MOUTH ONCE DAILY   metoprolol tartrate 50 MG tablet Commonly known as:  LOPRESSOR TAKE ONE TABLET BY MOUTH TWICE DAILY What changed:    how much to take  how to take this  when to take this   multivitamin Tabs tablet Take 1 tablet by mouth at bedtime.   ondansetron 4 MG tablet Commonly known as:  ZOFRAN Take 1 tablet (4 mg total) by mouth every 6 (six) hours as needed for nausea.   pantoprazole 40 MG tablet Commonly known as:  PROTONIX Take 1 tablet (40 mg total) by mouth 2 (two) times daily before a meal.   polyethylene glycol packet Commonly known as:  MIRALAX / GLYCOLAX Take 17 g by mouth daily as needed for mild constipation. What changed:  when to take this   polyvinyl alcohol 1.4 % ophthalmic solution Commonly known as:  LIQUIFILM TEARS Place 1 drop into both eyes as needed for  dry eyes.   pravastatin 40 MG tablet Commonly known as:  PRAVACHOL TAKE ONE TABLET BY MOUTH IN THE EVENING What changed:    how much to take  how to take this  when to take this   senna-docusate 8.6-50 MG tablet Commonly known as:  Senokot-S Take 1 tablet by mouth at bedtime as needed for mild constipation.   zinc sulfate 220 (50 Zn) MG capsule Take 220 mg by mouth daily.       Discharge Medications: Please see discharge summary for a list of discharge medications.  Relevant Imaging Results:  Relevant Lab Results:   Additional Information    Ayjah Show  Louretta Shorten, LCSWA

## 2017-06-30 NOTE — Progress Notes (Signed)
PT Cancellation Note  Patient Details Name: [name] MRN: [id number] DOB: 09/05/1920   Cancelled Treatment:    Reason Eval/Treat Not Completed: Patient declined, no reason specified.  Pt sleeping upon PT arriving to room.  Pt briefly opening eyes to vc's and tactile cues but each time would close her eyes again and appeared to go back to sleep.  Unable to get pt to keep her eyes open or follow any commands.  Will re-attempt PT evaluation at a later date/time.  CSW notified regarding pt's status; nursing aware of pt's status.  Leitha Bleak, PT 06/30/17, 1:22 PM [phone]

## 2017-06-30 NOTE — Progress Notes (Signed)
Called to HD unit for bleeding from access site, some dysfunction per staff - will ask vascular to see, also with lethargy, CTH done per my read - noted for severe atrophic change/no bleeding or mass effect, will hold discharge, continue to observe, will update family

## 2017-06-30 NOTE — Care Management (Signed)
Amanda Morris dialysis liaison notified of discharge  

## 2017-06-30 NOTE — Progress Notes (Signed)
Attempted to perform HD, pt very lethargic as reported from primary RN, Vitals stable  upon cannulation pt gushed blood around the needle insertion site, MD consulted and ordered STAT CT, for possible bleeding from fistula. Report given to primary and pt transported to CT.

## 2017-07-01 ENCOUNTER — Inpatient Hospital Stay: Payer: Medicare Other

## 2017-07-01 ENCOUNTER — Ambulatory Visit: Payer: Medicare Other | Admitting: Internal Medicine

## 2017-07-01 DIAGNOSIS — R627 Adult failure to thrive: Secondary | ICD-10-CM

## 2017-07-01 DIAGNOSIS — R4182 Altered mental status, unspecified: Secondary | ICD-10-CM

## 2017-07-01 LAB — GLUCOSE, CAPILLARY
GLUCOSE-CAPILLARY: 91 mg/dL (ref 70–99)
Glucose-Capillary: 80 mg/dL (ref 70–99)
Glucose-Capillary: 138 mg/dL — ABNORMAL HIGH (ref 70–99)
Glucose-Capillary: 151 mg/dL — ABNORMAL HIGH (ref 70–99)
Glucose-Capillary: 131 mg/dL — ABNORMAL HIGH (ref 70–99)

## 2017-07-01 LAB — TSH: TSH: 2.832 u[IU]/mL (ref 0.350–4.500)

## 2017-07-01 LAB — TROPONIN I: TROPONIN I: 0.04 ng/mL — AB (ref <0.03)

## 2017-07-01 MED ORDER — VALPROATE SODIUM 500 MG/5ML IV SOLN
1000.0000 mg | Freq: Once | INTRAVENOUS | Status: AC
  Administered 2017-07-01: 1000 mg via INTRAVENOUS
  Filled 2017-07-01: qty 10

## 2017-07-01 MED ORDER — DEXTROSE 10 % IV SOLN
INTRAVENOUS | Status: DC
  Administered 2017-07-01 – 2017-07-02 (×2): via INTRAVENOUS

## 2017-07-01 MED ORDER — CEFDINIR 300 MG PO CAPS
300.0000 mg | ORAL_CAPSULE | ORAL | Status: DC
  Administered 2017-07-02 – 2017-07-04 (×3): 300 mg via ORAL
  Filled 2017-07-01 (×5): qty 1

## 2017-07-01 MED ORDER — VALPROATE SODIUM 500 MG/5ML IV SOLN
500.0000 mg | Freq: Two times a day (BID) | INTRAVENOUS | Status: DC
  Administered 2017-07-02 (×2): 500 mg via INTRAVENOUS
  Filled 2017-07-01 (×3): qty 5

## 2017-07-01 NOTE — Progress Notes (Signed)
eeg completed ° °

## 2017-07-01 NOTE — Progress Notes (Signed)
[facility]  ROUNDING NOTE   Subjective:   Patient unable to get hemodialysis yesterday. With bleeding from AVF. Patient was nonresponsive. CT of head was with no acute changes. Patient more awake since then but slow to respond to questions.   Placed on dextrose gtt due to not eating and hypoglycemia.   Objective:  Vital signs in last 24 hours:  Temp:  [97.8 F (36.6 C)-98.1 F (36.7 C)] 98 F (36.7 C) (06/27 0444) Pulse Rate:  [93-99] 96 (06/27 0444) Resp:  [16-20] 20 (06/27 0444) BP: (139-173)/(54-64) 156/54 (06/27 0444) SpO2:  [96 %-98 %] 97 % (06/27 0444)  Weight change:  Filed Weights   06/27/17 1347 06/28/17 1104 06/28/17 1409  Weight: 44 kg (97 lb) 44.5 kg (98 lb 1.7 oz) 44 kg (97 lb)    Intake/Output: I/O last 3 completed shifts: In: 3 [I.V.:3] Out: 0    Intake/Output this shift:  No intake/output data recorded.  Physical Exam: General: NAD, laying in bed  Head: Normocephalic, atraumatic. Moist oral mucosal membranes  Eyes: Anicteric, PERRL  Neck: Supple, trachea midline  Lungs:  Clear to auscultation  Heart: Regular rate and rhythm  Abdomen:  Soft, nontender,   Extremities: no peripheral edema.  Neurologic: Opening eyes and responding to verbal stimuli  Skin: No lesions  Access: Left AVF    Basic Metabolic Panel: Recent Labs  Lab 06/27/17 1357 06/28/17 0418 06/28/17 1345 06/30/17 1654  NA 133* 134* 132* 136  K 3.9 4.2 4.2 4.7  CL 93* 97* 95* 100  CO2 26 25 23 23   GLUCOSE 119* 80 116* 115*  BUN 34* 39* 41* 37*  CREATININE 3.99* 4.50* 4.79* 4.37*  CALCIUM 9.0 8.2* 8.2* 8.6*  MG  --   --   --  2.4  PHOS  --   --  5.1* 4.7*    Liver Function Tests: Recent Labs  Lab 06/27/17 1357 06/28/17 1345 06/30/17 1654  AST 24  --   --   ALT 13*  --   --   ALKPHOS 177*  --   --   BILITOT 1.2  --   --   PROT 8.2*  --   --   ALBUMIN 3.3* 2.7* 2.7*   No results for input(s): LIPASE, AMYLASE in the last 168 hours. Recent Labs  Lab  06/30/17 1654  AMMONIA 22    CBC: Recent Labs  Lab 06/27/17 1357 06/28/17 0418 06/28/17 1345 06/30/17 1654  WBC 10.4 7.5 7.3 5.9  NEUTROABS 8.6*  --  6.0  --   HGB 12.4 10.6* 10.7* 11.5*  HCT 37.9 31.7* 32.5* 34.6*  MCV 96.5 96.3 96.4 96.6  PLT 179 147* 154 193    Cardiac Enzymes: Recent Labs  Lab 06/27/17 1357 06/30/17 1654 06/30/17 2156 07/01/17 0428  TROPONINI 0.04* 0.03* 0.03* 0.04*    BNP: Invalid input(s): POCBNP  CBG: Recent Labs  Lab 06/30/17 1207 06/30/17 1653 06/30/17 2123 07/01/17 0457 07/01/17 0721  GLUCAP 107* 116* 88 80 138*    Microbiology: Results for orders placed or performed during the hospital encounter of 06/27/17  Blood Culture (routine x 2)     Status: None (Preliminary result)   Collection Time: 06/27/17  1:59 PM  Result Value Ref Range Status   Specimen Description BLOOD RIGHT ARM  Final   Special Requests   Final    BOTTLES DRAWN AEROBIC AND ANAEROBIC Blood Culture adequate volume   Culture   Final    NO GROWTH 4 DAYS Performed  at [facility], [address] [place], [place] [address]    Report Status PENDING  Incomplete  Blood Culture (routine x 2)     Status: None (Preliminary result)   Collection Time: 06/27/17  1:59 PM  Result Value Ref Range Status   Specimen Description BLOOD RIGHT ARM  Final   Special Requests   Final    BOTTLES DRAWN AEROBIC AND ANAEROBIC Blood Culture adequate volume   Culture   Final    NO GROWTH 4 DAYS Performed at [facility], [address] [place], [place] [address]    Report Status PENDING  Incomplete  Urine culture     Status: Abnormal   Collection Time: 06/27/17  3:05 PM  Result Value Ref Range Status   Specimen Description   Final    URINE, RANDOM Performed at [facility], [address] [place], [place] [address]    Special Requests   Final    NONE Performed at [facility], [address], [place], [place] [address]    Culture (A)   Final    >=100,000 COLONIES/mL ESCHERICHIA COLI >=100,000 COLONIES/mL KLEBSIELLA ORNITHINOLYTICA    Report Status 06/30/2017 FINAL  Final   Organism ID, Bacteria ESCHERICHIA COLI (A)  Final   Organism ID, Bacteria KLEBSIELLA ORNITHINOLYTICA (A)  Final      Susceptibility   Escherichia coli - MIC*    AMPICILLIN >=32 RESISTANT Resistant     CEFAZOLIN 8 SENSITIVE Sensitive     CEFTRIAXONE <=1 SENSITIVE Sensitive     CIPROFLOXACIN >=4 RESISTANT Resistant     GENTAMICIN >=16 RESISTANT Resistant     IMIPENEM <=0.25 SENSITIVE Sensitive     NITROFURANTOIN 32 SENSITIVE Sensitive     TRIMETH/SULFA <=20 SENSITIVE Sensitive     AMPICILLIN/SULBACTAM >=32 RESISTANT Resistant     PIP/TAZO <=4 SENSITIVE Sensitive     Extended ESBL NEGATIVE Sensitive     * >=100,000 COLONIES/mL ESCHERICHIA COLI   Klebsiella ornithinolytica - MIC*    AMPICILLIN >=32 RESISTANT Resistant     CEFAZOLIN <=4 SENSITIVE Sensitive     CEFTRIAXONE <=1 SENSITIVE Sensitive     CIPROFLOXACIN <=0.25 SENSITIVE Sensitive     GENTAMICIN <=1 SENSITIVE Sensitive     IMIPENEM <=0.25 SENSITIVE Sensitive     NITROFURANTOIN 32 SENSITIVE Sensitive     TRIMETH/SULFA <=20 SENSITIVE Sensitive     AMPICILLIN/SULBACTAM 4 SENSITIVE Sensitive     PIP/TAZO <=4 SENSITIVE Sensitive     * >=100,000 COLONIES/mL KLEBSIELLA ORNITHINOLYTICA  MRSA PCR Screening     Status: None   Collection Time: 06/27/17  4:54 PM  Result Value Ref Range Status   MRSA by PCR NEGATIVE NEGATIVE Final    Comment:        The GeneXpert MRSA Assay (FDA approved for NASAL specimens only), is one component of a comprehensive MRSA colonization surveillance program. It is not intended to diagnose MRSA infection nor to guide or monitor treatment for MRSA infections. Performed at [facility], [address] [place], [place] [address]     Coagulation Studies: No results for input(s): LABPROT, INR in the last 72 hours.  Urinalysis: No results for  input(s): COLORURINE, LABSPEC, PHURINE, GLUCOSEU, HGBUR, BILIRUBINUR, KETONESUR, PROTEINUR, UROBILINOGEN, NITRITE, LEUKOCYTESUR in the last 72 hours.  Invalid input(s): APPERANCEUR    Imaging: Ct Head Wo Contrast  Result Date: 06/30/2017 CLINICAL DATA:  Altered mental status EXAM: CT HEAD WITHOUT CONTRAST TECHNIQUE: Contiguous axial images were obtained from the base of the skull  through the vertex without intravenous contrast. COMPARISON:  Head CT 04/26/2017 FINDINGS: Brain: There is no mass, hemorrhage or extra-axial collection. There is generalized atrophy without lobar predilection. There is an old right parietal lobe infarct and bilateral old gangliocapsular lacunar infarcts. There is hypoattenuation of the periventricular white matter, most commonly indicating chronic ischemic microangiopathy. Vascular: Atherosclerotic calcification of the vertebral and internal carotid arteries at the skull base. No abnormal hyperdensity of the major intracranial arteries or dural venous sinuses. Skull: The visualized skull base, calvarium and extracranial soft tissues are normal. Sinuses/Orbits: No fluid levels or advanced mucosal thickening of the visualized paranasal sinuses. No mastoid or middle ear effusion. The orbits are normal. Electronically Signed   By: Ulyses Jarred M.D.   On: 06/30/2017 16:32     Medications:   . sodium chloride Stopped (06/28/17 1900)  . dextrose 40 mL/hr at 07/01/17 1024   . amLODipine  10 mg Oral Daily  . calcium carbonate  1 tablet Oral TID WC  . cefdinir  300 mg Oral Q24H  . feeding supplement (ENSURE ENLIVE)  237 mL Oral TID BM  . feeding supplement (PRO-STAT SUGAR FREE 64)  30 mL Oral TID WC  . folic acid  0.5 mg Oral Daily  . Ganciclovir  1 drop Right Eye QID  . heparin  5,000 Units Subcutaneous Q8H  . hydrALAZINE  25 mg Oral QID  . insulin aspart  0-5 Units Subcutaneous QHS  . insulin aspart  0-9 Units Subcutaneous TID WC  . levothyroxine  75 mcg Oral Daily  .  metoprolol tartrate  50 mg Oral BID  . multivitamin  1 tablet Oral QHS  . pantoprazole  40 mg Oral BID AC  . polyethylene glycol  17 g Oral Daily  . pravastatin  40 mg Oral Daily  . sodium chloride flush  3 mL Intravenous Q12H  . ascorbic acid  500 mg Oral BID  . zinc sulfate  220 mg Oral Daily   sodium chloride, acetaminophen **OR** acetaminophen, albuterol, bisacodyl, guaiFENesin-dextromethorphan, HYDROcodone-acetaminophen, ipratropium-albuterol, ondansetron **OR** ondansetron (ZOFRAN) IV, polyvinyl alcohol, senna-docusate, sodium chloride flush  Assessment/ Plan:  [name] is a 84 y.o. white female with end stage renal disease on hemodialysis, coronary artery disease, history of bilateral knee replacement, aortic valve replacement 2009, diabetes, hypertension, hypothyroidism, peripheral vascular disease, pulmonary hypertension, anxiety  [facility] [address]/MWF  1. End Stage Renal Disease: with complication of dialysis access. Appreciate vascular input. Patient scheduled for fistulogram for tomorrow.  No acute indication for dialysis. Will monitor for daily need.   2.  Anemia of chronic kidney disease: hemoglobin 11.5.  - mircera as outpatient   3. Secondary Hyperparathyroidism: phosphorus and calcium at goal.  - tums with meals. When tolerating PO  4. Hypertension: mildly elevated. Not able to tolerate PO medications - last was given yesterday.  - amlodipine, hydralazine, and metoprolol.   5. Altered mental status: CT negative.  - Appreciate neurology input. Patient will need EEG, MRI and neurologic work up.   Case discussed with Neurology.  Appreciate palliative care team.    LOS: 4 Naveen Clardy 6/27/201911:08 AM

## 2017-07-01 NOTE — Progress Notes (Signed)
PHARMACY NOTE:  ANTIMICROBIAL RENAL DOSAGE ADJUSTMENT  Current antimicrobial regimen includes a mismatch between antimicrobial dosage and estimated renal function.  As per policy approved by the Pharmacy & Therapeutics and Medical Executive Committees, the antimicrobial dosage will be adjusted accordingly.  Current antimicrobial dosage:  Cefdinir 300mg  q12h  Indication: UTI  Renal Function:  Estimated Creatinine Clearance: 5.3 mL/min (A) (by C-G formula based on SCr of 4.37 mg/dL (H)). [x]      On intermittent HD, scheduled: []      On CRRT    Antimicrobial dosage has been changed to:  Cefdinir 300mg  q24h  Additional comments:   Thank you for allowing pharmacy to be a part of this patient's care.  Dallie Piles, [facility] 07/01/2017 9:39 AM

## 2017-07-01 NOTE — Progress Notes (Signed)
Dr patel gave an order to change to IVF rate of the D10 to 76ml/hr.  She was updated on the patient in regards that the patient is not eating, drinking, or taking meds

## 2017-07-01 NOTE — Progress Notes (Signed)
PT Cancellation Note  Patient Details Name: [name] MRN: [id number] DOB: 02-15-1920   Cancelled Treatment:    Reason Eval/Treat Not Completed: Other (comment).  PT consult received.  Chart reviewed.  Pt sleeping upon PT entry and opened eyes briefly with vc's but did not respond to therapist or initiate any other movement.  Per chart, discussion of monitoring pt's status today and possible transition to comfort care tomorrow.  Will monitor pt's status at this time and initiate eval if pt's status improves.  Leitha Bleak, PT 07/01/17, 4:25 PM [phone]

## 2017-07-01 NOTE — Progress Notes (Signed)
Patient was lethargic last night and refused to take medications or drink fluids.  Patient placed on D10 @ 71mL/hr due to blood sugar of 80.  Will continue to monitor.  Christene Slates   07/01/2017  6:43 AM

## 2017-07-01 NOTE — Progress Notes (Signed)
Patient will not eat when food is given to her.  She did attempt to sip from the cup then let the water run out.  I have not been able to get any oral meds in her.  She is very withdrawn and hollers out when touched or when care is given

## 2017-07-01 NOTE — Consult Note (Signed)
Reason for Consult:AMS Referring Physician: Posey Pronto  CC: AMS  HPI: [name] is an 84 y.o. female with multiple medical problems, unable to provide history due to mental status.  Family not available at this time therefore all history obtained from the chart.  Patient recently admitted with UTI and respiratory failure. Has had some recent failure to thrive.  On yesterday became unresponsive.  Has had some issues with HD due to access problems.  Has required multiple admissions this year.      Past Medical History:  Diagnosis Date  . Anemia    NOS  . Anxiety   . Aortic stenosis   . Cancer [facility])    Mole on top of head to be removed in July 2013  . Chronic diastolic CHF (congestive heart failure) ([place])    a. 01/2017 Echo: EF 60-65%, no rwma, Gr2 DD, mild to mod MR, mildly dil LA/RA, nl RV fxn, Sev TR, PASP 36mmHg.  . Constipation   . Coronary artery disease    a. 2009 CABG x 3 w/ bioprosthetic AVR.  . Diabetes mellitus    type II;was on Glipizide but has been off x 5mon  . Dialysis patient ([place])   . Diarrhea   . ESRD (end stage renal disease) ([place])   . GERD (gastroesophageal reflux disease)   . History of blood transfusion   . Hyperlipidemia    takes Simvastatin nightly  . Hypertension    takes Metoprolol daily  . Hypothyroidism    takes Synthroid daily  . Migraine    hx of  . Oligouria   . Osteoarthritis   . Osteopenia   . Peripheral vascular disease ([place])   . Personal history of colonic polyps   . Pulmonary hypertension ([place])   . Urinary incontinence   . UTI (urinary tract infection)     Past Surgical History:  Procedure Laterality Date  . A/V FISTULAGRAM [name] 01/07/2017   Procedure: A/V Fistulagram;  Surgeon: Algernon Huxley, MD;  Location: [place] CV LAB;  Service: Cardiovascular;  Laterality: [name];  . A/V SHUNT INTERVENTION [name] 01/07/2017   Procedure: A/V SHUNT INTERVENTION;  Surgeon: Algernon Huxley, MD;  Location: [place] [facility];  Service: Cardiovascular;   Laterality: [name];  . ABDOMINAL HYSTERECTOMY    . adenosine myoview  2007   benign, EF 69%  . AMPUTATION  06/18/2011   Procedure: AMPUTATION DIGIT;  Surgeon: Newt Minion, MD;  Location: [place];  Service: Orthopedics;  Laterality: Left;  Left 2nd Toe Amputation at MTP Joint  . Amputation-left great toe  7/12   Dr Marlou Sa  . AORTIC VALVE REPLACEMENT  2009  . APPENDECTOMY    . AV FISTULA PLACEMENT  12/22/2010   Procedure: ARTERIOVENOUS (AV) FISTULA CREATION;  Surgeon: Hinda Lenis, MD;  Location: [place];  Service: Vascular;  Laterality: Left;  Creation of Left Brachial-Cephalic Fistula  . CARDIAC CATHETERIZATION  2000   cad  . CAROTID ENDARTERECTOMY  2011   Right  . CATARACT EXTRACTION    . COLONOSCOPY  05/21/2017   Procedure: COLONOSCOPY;  Surgeon: Toledo, Benay Pike, MD;  Location: [facility] ENDOSCOPY;  Service: Gastroenterology;;  . CORONARY ARTERY BYPASS GRAFT     od  . CORONARY ARTERY BYPASS GRAFT  2009  . ESOPHAGOGASTRODUODENOSCOPY [name] 05/21/2017   Procedure: ESOPHAGOGASTRODUODENOSCOPY (EGD);  Surgeon: Toledo, Benay Pike, MD;  Location: [facility] ENDOSCOPY;  Service: Gastroenterology;  Laterality: [name];  . EYE SURGERY     BIlateral  . INSERTION OF DIALYSIS  CATHETER  12/18/2010   Procedure: INSERTION OF DIALYSIS CATHETER;  Surgeon: Mal Misty, MD;  Location: [place];  Service: Vascular;  Laterality: Right;  . REPLACEMENT TOTAL KNEE BILATERAL  05/1998  . TONSILLECTOMY    . Traumatic Amputation of Right DIP joint of Index Finger      Family History  Family history unknown: Yes    Social History:  reports that she quit smoking about 74 years ago. Her smoking use included cigarettes. She quit after 15.00 years of use. She has never used smokeless tobacco. She reports that she drinks alcohol. She reports that she does not use drugs.  Allergies  Allergen Reactions  . Nitrofurantoin Itching  . Ace Inhibitors   . Captopril Other (See Comments)    REACTION: unspecified  . Enalapril Maleate  Cough  . Ramipril Other (See Comments)    REACTION: unspecified  . Sulfa Antibiotics Other (See Comments)    Reaction unknown  . Verapamil Other (See Comments)    REACTION: unspecified    Medications:  I have reviewed the patient's current medications. Prior to Admission:  Medications Prior to Admission  Medication Sig Dispense Refill Last Dose  . acetaminophen (TYLENOL) 325 MG tablet Take 1 tablet (325 mg total) by mouth every 6 (six) hours as needed for mild pain or fever (or Fever >/= 101).   PRN at PRN  . Amino Acids-Protein Hydrolys (FEEDING SUPPLEMENT, PRO-STAT SUGAR FREE 64,) LIQD Take 30 mLs by mouth 3 (three) times daily with meals.   06/27/2017 at 0800  . amLODipine (NORVASC) 10 MG tablet Take 1 tablet (10 mg total) by mouth daily.   06/27/2017 at 0800  . calcium carbonate (TUMS - DOSED IN MG ELEMENTAL CALCIUM) 500 MG chewable tablet Chew 1 tablet (200 mg of elemental calcium total) by mouth 3 (three) times daily with meals.   06/27/2017 at 0800  . folic acid (FOLVITE) 0.5 MG tablet Take 0.5 tablets (0.5 mg total) by mouth daily.   06/27/2017 at 0800  . Ganciclovir (ZIRGAN) 0.15 % GEL Place 1 drop into the right eye 4 (four) times daily. 5 g 0 06/27/2017 at 0800  . hydrALAZINE (APRESOLINE) 25 MG tablet Take 25 mg by mouth 4 (four) times daily.    06/27/2017 at 0800  . insulin aspart (NOVOLOG) 100 UNIT/ML injection Inject 0-9 Units into the skin 3 (three) times daily with meals. CBG < 70: implement hypoglycemia protocol CBG 70 - 120: 0 units CBG 121 - 150: 1 unit CBG 151 - 200: 2 units CBG 201 - 250: 3 units CBG 251 - 300: 5 units CBG 301 - 350: 7 units CBG 351 - 400: 9 units CBG > 400: call MD and obtain STAT lab verification 10 mL 11 PRN at PRN  . levothyroxine (SYNTHROID, LEVOTHROID) 75 MCG tablet TAKE ONE TABLET BY MOUTH ONCE DAILY 30 tablet 11 06/27/2017 at 0600  . metoprolol tartrate (LOPRESSOR) 50 MG tablet TAKE ONE TABLET BY MOUTH TWICE DAILY (Patient taking differently: Take  75 mg by mouth twice daily.) 60 tablet 0 06/27/2017 at 0800  . Multiple Vitamins-Minerals (CEROVITE SENIOR) TABS Take 1 tablet by mouth daily.   06/27/2017 at 0800  . ondansetron (ZOFRAN) 4 MG tablet Take 1 tablet (4 mg total) by mouth every 6 (six) hours as needed for nausea. 20 tablet 0 PRN at PRN  . pantoprazole (PROTONIX) 40 MG tablet Take 1 tablet (40 mg total) by mouth 2 (two) times daily before a meal.   06/27/2017 at  0600  . polyethylene glycol (MIRALAX / GLYCOLAX) packet Take 17 g by mouth daily as needed for mild constipation. (Patient taking differently: Take 17 g by mouth daily. ) 14 each 0 06/27/2017 at 0800  . polyvinyl alcohol (LIQUIFILM TEARS) 1.4 % ophthalmic solution Place 1 drop into both eyes as needed for dry eyes. 15 mL 0 PRN at PRN  . pravastatin (PRAVACHOL) 40 MG tablet TAKE ONE TABLET BY MOUTH IN THE EVENING (Patient taking differently: TAKE ONE TABLET BY MOUTH DAILY) 90 tablet 4 06/27/2017 at 0800  . senna-docusate (SENOKOT-S) 8.6-50 MG tablet Take 1 tablet by mouth at bedtime as needed for mild constipation.   PRN at PRN  . vitamin C (VITAMIN C) 500 MG tablet Take 1 tablet (500 mg total) by mouth daily. (Patient taking differently: Take 500 mg by mouth 2 (two) times daily. )   06/27/2017 at 0800  . zinc sulfate 220 (50 Zn) MG capsule Take 220 mg by mouth daily.   06/27/2017 at 0800  . albuterol (PROVENTIL) (2.5 MG/3ML) 0.083% nebulizer solution Take 3 mLs (2.5 mg total) by nebulization every 2 (two) hours as needed for wheezing. (Patient not taking: Reported on 06/27/2017) 75 mL 12 Not Taking at Unknown time  . epoetin alfa (EPOGEN,PROCRIT) 69629 UNIT/ML injection Inject 1 mL (10,000 Units total) into the vein every Monday, Wednesday, and Friday with hemodialysis. (Patient not taking: Reported on 06/27/2017) 1 mL  Not Taking at Unknown time  . feeding supplement, ENSURE ENLIVE, (ENSURE ENLIVE) LIQD Take 237 mLs by mouth 3 (three) times daily between meals. (Patient not taking: Reported  on 06/27/2017) 237 mL 12 Not Taking at Unknown time  . HYDROcodone-acetaminophen (NORCO/VICODIN) 5-325 MG tablet Take 1-2 tablets by mouth every 4 (four) hours as needed for moderate pain. (Patient not taking: Reported on 06/27/2017) 30 tablet 0 Not Taking at Unknown time  . multivitamin (RENA-VIT) TABS tablet Take 1 tablet by mouth at bedtime. (Patient not taking: Reported on 06/27/2017) 30 tablet 0 Not Taking   Scheduled: . amLODipine  10 mg Oral Daily  . calcium carbonate  1 tablet Oral TID WC  . cefdinir  300 mg Oral Q24H  . feeding supplement (ENSURE ENLIVE)  237 mL Oral TID BM  . feeding supplement (PRO-STAT SUGAR FREE 64)  30 mL Oral TID WC  . folic acid  0.5 mg Oral Daily  . Ganciclovir  1 drop Right Eye QID  . heparin  5,000 Units Subcutaneous Q8H  . hydrALAZINE  25 mg Oral QID  . insulin aspart  0-5 Units Subcutaneous QHS  . insulin aspart  0-9 Units Subcutaneous TID WC  . levothyroxine  75 mcg Oral Daily  . metoprolol tartrate  50 mg Oral BID  . multivitamin  1 tablet Oral QHS  . pantoprazole  40 mg Oral BID AC  . polyethylene glycol  17 g Oral Daily  . pravastatin  40 mg Oral Daily  . sodium chloride flush  3 mL Intravenous Q12H  . ascorbic acid  500 mg Oral BID  . zinc sulfate  220 mg Oral Daily    ROS: Unable to provide due to mental status  Physical Examination: Blood pressure (!) 174/63, pulse (!) 104, temperature 98.6 F (37 C), temperature source Axillary, resp. rate 18, height 5\' 2"  (1.575 m), weight 44 kg (97 lb), SpO2 94 %.  HEENT-  Normocephalic, no lesions, without obvious abnormality.  Normal external eye and conjunctiva.  Normal TM's bilaterally.  Normal auditory canals and external  ears. Normal external nose, mucus membranes and septum.  Normal pharynx. Cardiovascular- S1, S2 normal, pulses palpable throughout   Lungs- chest clear, no wheezing, rales, normal symmetric air entry Abdomen- soft, non-tender; bowel sounds normal; no masses,  no  organomegaly Extremities- no edema, left toe amputation Lymph-no adenopathy palpable Musculoskeletal-no joint tenderness, deformity or swelling Skin-warm and dry, no hyperpigmentation, vitiligo, or suspicious lesions  Neurological Examination   Mental Status: Awake.  Groans but does not speak.  Does not follow commands. Cranial Nerves: II: Discs flat bilaterally; Blinks to bilateral confrontation more reproducibly from the right than from the left.  Pupils equal, round, reactive to light and accommodation III,IV, VI: ptosis not present, extra-ocular motions grossly intact bilaterally V,VII: mild left facial droop VIII: unable to test IX,X: unable to test XI: unable to test XII: unable to test Motor: Moves all extremities against gravity with no focal weakness noted.   Sensory: Responds to noxious stimuli bilaterally Deep Tendon Reflexes: 2+ in the upper extremities and absent in the lower extremities Plantars: Right: downgoing   Left: unable to test Cerebellar: unable to test Gait: unable to test due to safety concerns    Laboratory Studies:   Basic Metabolic Panel: Recent Labs  Lab 06/27/17 1357 06/28/17 0418 06/28/17 1345 06/30/17 1654  NA 133* 134* 132* 136  K 3.9 4.2 4.2 4.7  CL 93* 97* 95* 100  CO2 26 25 23 23   GLUCOSE 119* 80 116* 115*  BUN 34* 39* 41* 37*  CREATININE 3.99* 4.50* 4.79* 4.37*  CALCIUM 9.0 8.2* 8.2* 8.6*  MG  --   --   --  2.4  PHOS  --   --  5.1* 4.7*    Liver Function Tests: Recent Labs  Lab 06/27/17 1357 06/28/17 1345 06/30/17 1654  AST 24  --   --   ALT 13*  --   --   ALKPHOS 177*  --   --   BILITOT 1.2  --   --   PROT 8.2*  --   --   ALBUMIN 3.3* 2.7* 2.7*   No results for input(s): LIPASE, AMYLASE in the last 168 hours. Recent Labs  Lab 06/30/17 1654  AMMONIA 22    CBC: Recent Labs  Lab 06/27/17 1357 06/28/17 0418 06/28/17 1345 06/30/17 1654  WBC 10.4 7.5 7.3 5.9  NEUTROABS 8.6*  --  6.0  --   HGB 12.4 10.6* 10.7*  11.5*  HCT 37.9 31.7* 32.5* 34.6*  MCV 96.5 96.3 96.4 96.6  PLT 179 147* 154 193    Cardiac Enzymes: Recent Labs  Lab 06/27/17 1357 06/30/17 1654 06/30/17 2156 07/01/17 0428  TROPONINI 0.04* 0.03* 0.03* 0.04*    BNP: Invalid input(s): POCBNP  CBG: Recent Labs  Lab 06/30/17 1653 06/30/17 2123 07/01/17 0457 07/01/17 0721 07/01/17 1136  GLUCAP 116* 88 80 138* 151*    Microbiology: Results for orders placed or performed during the hospital encounter of 06/27/17  Blood Culture (routine x 2)     Status: None (Preliminary result)   Collection Time: 06/27/17  1:59 PM  Result Value Ref Range Status   Specimen Description BLOOD RIGHT ARM  Final   Special Requests   Final    BOTTLES DRAWN AEROBIC AND ANAEROBIC Blood Culture adequate volume   Culture   Final    NO GROWTH 4 DAYS Performed at [facility], [address] [place], [place] [address]    Report Status PENDING  Incomplete  Blood Culture (routine x 2)  Status: None (Preliminary result)   Collection Time: 06/27/17  1:59 PM  Result Value Ref Range Status   Specimen Description BLOOD RIGHT ARM  Final   Special Requests   Final    BOTTLES DRAWN AEROBIC AND ANAEROBIC Blood Culture adequate volume   Culture   Final    NO GROWTH 4 DAYS Performed at [facility], [address], [place], [place] [address]    Report Status PENDING  Incomplete  Urine culture     Status: Abnormal   Collection Time: 06/27/17  3:05 PM  Result Value Ref Range Status   Specimen Description   Final    URINE, RANDOM Performed at [facility], [address], [place], [place] [address]    Special Requests   Final    NONE Performed at [facility], [address] [place], [place] [address]    Culture (A)  Final    >=100,000 COLONIES/mL ESCHERICHIA COLI >=100,000 COLONIES/mL KLEBSIELLA ORNITHINOLYTICA    Report Status 06/30/2017 FINAL  Final   Organism ID, Bacteria ESCHERICHIA COLI (A)  Final    Organism ID, Bacteria KLEBSIELLA ORNITHINOLYTICA (A)  Final      Susceptibility   Escherichia coli - MIC*    AMPICILLIN >=32 RESISTANT Resistant     CEFAZOLIN 8 SENSITIVE Sensitive     CEFTRIAXONE <=1 SENSITIVE Sensitive     CIPROFLOXACIN >=4 RESISTANT Resistant     GENTAMICIN >=16 RESISTANT Resistant     IMIPENEM <=0.25 SENSITIVE Sensitive     NITROFURANTOIN 32 SENSITIVE Sensitive     TRIMETH/SULFA <=20 SENSITIVE Sensitive     AMPICILLIN/SULBACTAM >=32 RESISTANT Resistant     PIP/TAZO <=4 SENSITIVE Sensitive     Extended ESBL NEGATIVE Sensitive     * >=100,000 COLONIES/mL ESCHERICHIA COLI   Klebsiella ornithinolytica - MIC*    AMPICILLIN >=32 RESISTANT Resistant     CEFAZOLIN <=4 SENSITIVE Sensitive     CEFTRIAXONE <=1 SENSITIVE Sensitive     CIPROFLOXACIN <=0.25 SENSITIVE Sensitive     GENTAMICIN <=1 SENSITIVE Sensitive     IMIPENEM <=0.25 SENSITIVE Sensitive     NITROFURANTOIN 32 SENSITIVE Sensitive     TRIMETH/SULFA <=20 SENSITIVE Sensitive     AMPICILLIN/SULBACTAM 4 SENSITIVE Sensitive     PIP/TAZO <=4 SENSITIVE Sensitive     * >=100,000 COLONIES/mL KLEBSIELLA ORNITHINOLYTICA  MRSA PCR Screening     Status: None   Collection Time: 06/27/17  4:54 PM  Result Value Ref Range Status   MRSA by PCR NEGATIVE NEGATIVE Final    Comment:        The GeneXpert MRSA Assay (FDA approved for NASAL specimens only), is one component of a comprehensive MRSA colonization surveillance program. It is not intended to diagnose MRSA infection nor to guide or monitor treatment for MRSA infections. Performed at [facility], [address] [place], [place] [address]     Coagulation Studies: No results for input(s): LABPROT, INR in the last 72 hours.  Urinalysis:  Recent Labs  Lab 06/27/17 1505  COLORURINE AMBER*  LABSPEC 1.013  PHURINE 6.0  GLUCOSEU NEGATIVE  HGBUR MODERATE*  BILIRUBINUR NEGATIVE  KETONESUR NEGATIVE  PROTEINUR 100*  NITRITE NEGATIVE  LEUKOCYTESUR  MODERATE*    Lipid Panel:     Component Value Date/Time   CHOL 148 02/13/2016 1515   TRIG 89.0 02/13/2016 1515   TRIG 236 (HH) 01/13/2006 0000   HDL 52.40 02/13/2016 1515   CHOLHDL 3 02/13/2016 1515   VLDL 17.8 02/13/2016 1515  [place] 78 02/13/2016 1515    HgbA1C:  Lab Results  Component Value Date   HGBA1C 5.0 05/05/2017    Urine Drug Screen:  No results found for: LABOPIA, COCAINSCRNUR, LABBENZ, AMPHETMU, THCU, LABBARB  Alcohol Level: No results for input(s): ETH in the last 168 hours.  Other results: EKG: sinus at 91 bpm.  Imaging: Ct Head Wo Contrast  Result Date: 06/30/2017 CLINICAL DATA:  Altered mental status EXAM: CT HEAD WITHOUT CONTRAST TECHNIQUE: Contiguous axial images were obtained from the base of the skull through the vertex without intravenous contrast. COMPARISON:  Head CT 04/26/2017 FINDINGS: Brain: There is no mass, hemorrhage or extra-axial collection. There is generalized atrophy without lobar predilection. There is an old right parietal lobe infarct and bilateral old gangliocapsular lacunar infarcts. There is hypoattenuation of the periventricular white matter, most commonly indicating chronic ischemic microangiopathy. Vascular: Atherosclerotic calcification of the vertebral and internal carotid arteries at the skull base. No abnormal hyperdensity of the major intracranial arteries or dural venous sinuses. Skull: The visualized skull base, calvarium and extracranial soft tissues are normal. Sinuses/Orbits: No fluid levels or advanced mucosal thickening of the visualized paranasal sinuses. No mastoid or middle ear effusion. The orbits are normal. Electronically Signed   By: Ulyses Jarred M.D.   On: 06/30/2017 16:32     Assessment/Plan: 84 year old female with multiple medical problems initially presenting with respiratory failure, UTI and FTT.  Patient has had worsening mental status.  Unclear etiology.  Head CT performed shows past infarcts and atrophy but  no acute changes. Currently afebrile with no elevated white blood cell count.  Further work up indicated.  Recommendations: 1. TSH, B1 2. EEG 3. MRI of the brain without contrast 4. Will continue to follow with you   Alexis Goodell, MD Neurology [phone] 07/01/2017, 12:44 PM

## 2017-07-01 NOTE — Procedures (Signed)
ELECTROENCEPHALOGRAM REPORT   Patient: [name]       Room #: 218A-AA EEG No. ID: [id number] Age: 84 y.o.        Sex: female Referring Physician: Posey Pronto Report Date:  07/01/2017        Interpreting Physician: Alexis Goodell  History: [name] is an 84 y.o. female with altered mental status  Medications:  Norvasc, Tums, Omnicef, Insulin, Zirgan, Folvite, Hydralazine, Synthroid, Lopressor, Protonix, Miralax, Pravachol, Vitamin C, Zinc Sulfate  Conditions of Recording:  This is a 16 channel EEG carried out with the patient in the altered state.  Description:  The waking background is of low voltage and slow.  It consists of a polymorphic delta activity that is diffusely distributed.  Superimposed are frequent moderate to high voltage intermittent periodic discharges of triphasic morphology.  This triphasic activity is noted at a rate of approximately 2 Hz and is generalized.  This activity is persistent throughout the recording.   There is no eivdence of normal drowse or sleep.  Hyperventilation and intermittent photic stimulation were not performed   IMPRESSION: This is an abnormal electroencephalogram secondary to general background slowing and frequent triphasic waves.  This finding is most consistent with an encephalopathy although due to the regularity of the triphasic activity can not rule out subclinical seizure activity. Clin[facility] correlation recommended.     Alexis Goodell, MD Neurology [phone] 07/01/2017, 7:20 PM

## 2017-07-01 NOTE — Telephone Encounter (Signed)
Still in hospital today Will see how she does at [facility]--but will eventually need hospital follow up

## 2017-07-01 NOTE — Progress Notes (Signed)
Daily Progress Note   Patient Name: [name]       Date: 07/01/2017 DOB: 07-09-1920  Age: 84 y.o. MRN#: [id number] Attending Physician: Fritzi Mandes, MD Primary Care Physician: [name], MD Admit Date: 06/27/2017  Reason for Consultation/Follow-up: Establishing goals of care, Hospice Evaluation and Psychosocial/spiritual support  Subjective: Patient opens eyes. Does not respond to me. Unable to follow commands. Per nursing, will not eat or take meds. Was supposed to discharge last night but could not complete HD and increased lethargy.   Length of Stay: 4  Current Medications: Scheduled Meds:  . amLODipine  10 mg Oral Daily  . calcium carbonate  1 tablet Oral TID WC  . cefdinir  300 mg Oral Q24H  . feeding supplement (ENSURE ENLIVE)  237 mL Oral TID BM  . feeding supplement (PRO-STAT SUGAR FREE 64)  30 mL Oral TID WC  . folic acid  0.5 mg Oral Daily  . Ganciclovir  1 drop Right Eye QID  . heparin  5,000 Units Subcutaneous Q8H  . hydrALAZINE  25 mg Oral QID  . insulin aspart  0-5 Units Subcutaneous QHS  . insulin aspart  0-9 Units Subcutaneous TID WC  . levothyroxine  75 mcg Oral Daily  . metoprolol tartrate  50 mg Oral BID  . multivitamin  1 tablet Oral QHS  . pantoprazole  40 mg Oral BID AC  . polyethylene glycol  17 g Oral Daily  . pravastatin  40 mg Oral Daily  . sodium chloride flush  3 mL Intravenous Q12H  . ascorbic acid  500 mg Oral BID  . zinc sulfate  220 mg Oral Daily    Continuous Infusions: . sodium chloride Stopped (06/28/17 1900)  . dextrose 40 mL/hr at 07/01/17 1024    PRN Meds: sodium chloride, acetaminophen **OR** acetaminophen, albuterol, bisacodyl, guaiFENesin-dextromethorphan, HYDROcodone-acetaminophen, ipratropium-albuterol, ondansetron **OR**  ondansetron (ZOFRAN) IV, polyvinyl alcohol, senna-docusate, sodium chloride flush  Physical Exam   Constitutional: No distress.  Frail  HENT:  Head: Normocephalic and atraumatic.  Cardiovascular:  regular rhythm, tachycardic  Pulmonary/Chest: Effort normal. No respiratory distress.  Breath sounds clear anteriorly  Abdominal: Soft. Bowel sounds are normal.  Neurological:  Does not respond to voice or touch Skin: Skin is warm and dry.   Vital Signs: BP (!) 156/54 (BP Location: Right Arm)   Pulse 96   Temp 98 F (36.7 C) (Oral)   Resp 20   Ht 5\' 2"  (1.575 m)   Wt 44 kg (97 lb)   SpO2 97%   BMI 17.74 kg/m  SpO2: SpO2: 97 % O2 Device: O2 Device: Nasal Cannula O2 Flow Rate: O2 Flow Rate (L/min): 3.5 L/min  Intake/output summary:   Intake/Output Summary (Last 24 hours) at 07/01/2017 1029 Last data filed at 07/01/2017 1017 Gross per 24 hour  Intake 3 ml  Output 0 ml  Net 3 ml   LBM: Last BM Date: (pt not talking) Baseline Weight: Weight: 44 kg (97 lb) Most recent weight: Weight: 44 kg (97 lb)       Palliative Assessment/Data: PPS 10%    Flowsheet Rows     Most Recent Value  Intake Tab  Referral Department  Hospitalist  Unit at Time of Referral  Med/Surg Unit  Palliative Care Primary Diagnosis  Cardiac  Date Notified  06/28/17  Palliative Care Type  New Palliative care  Reason for referral  Clarify Goals of Care  Date of Admission  06/27/17  Date first seen by Palliative Care  06/29/17  # of days Palliative referral response time  1 Day(s)  # of days IP prior to Palliative referral  1  Clinical Assessment  Palliative Performance Scale Score  50%  Psychosocial & Spiritual Assessment  Palliative Care Outcomes  Patient/Family meeting held?  Yes  Who was at the meeting?  son [name]  Palliative Care Outcomes  Counseled regarding hospice, Provided psychosocial or spiritual support, Linked to palliative care logitudinal support      Patient Active Problem List    Diagnosis Date Noted  . Goals of care, counseling/discussion   . Palliative care by specialist   . Acute respiratory failure with hypoxia ([place]) 06/27/2017  . Pressure injury of skin 05/19/2017  . GIB (gastrointestinal bleeding) 05/17/2017  . GI bleed 05/05/2017  . Pelvic fracture ([place]) 04/26/2017  . Protein-calorie malnutrition, severe 01/07/2017  . CHF (congestive heart failure) ([place]) 01/03/2017  . UTI (urinary tract infection) 01/03/2017  . Acute metabolic encephalopathy 47/65/4650  . Cerebrovascular accident (CVA) due to occlusion of right middle cerebral artery ([place]) 10/24/2015  . Malnutrition of mild degree ([place]) 10/24/2015  . Altered mental status 09/18/2015  . Preventative health care 05/03/2014  . Peripheral vascular disease ([place]) 12/21/2013  . Toe amputation status ([place]) 08/29/2013  . History of recent fall 04/18/2013  . HTN (hypertension), malignant 03/22/2013  . S/P aortic valve replacement with bioprosthetic valve 11/15/2012  . S/P CABG x 3 11/15/2012  . Anemia of chronic kidney failure 12/22/2010  . Thrombocytopenia ([place]) 12/13/2010  . End stage renal disease on dialysis ([place]) 12/13/2010  . S/P aortic valve replacement 08/11/2010  . Arterial insufficiency-lower 07/25/2010  . NEURODERMATITIS 04/08/2009  . Carotid arterial disease ([place]) 01/01/2009  . Coronary atherosclerosis 02/11/2007  . AORTIC STENOSIS 10/04/2006  . ANXIETY 05/30/2006  . Pulmonary hypertension ([place]) 05/30/2006  . Hypothyroidism 05/27/2006  . Type 2 diabetes mellitus with renal manifestations, controlled ([place]) 05/27/2006  . Hyperlipidemia 05/27/2006  . Essential hypertension 05/27/2006  . GERD 05/27/2006  . OSTEOARTHRITIS 05/27/2006  . OSTEOPENIA 05/27/2006  . URINARY INCONTINENCE 05/27/2006    Palliative Care Assessment & Plan   HPI: 84 y.o. female  with past medical history of ESRD on HD, CAD, diastolic HF, AVR 3546, DM, HTN, hypothyroidism, PVD, pulm htn,  admitted on 06/27/2017 with  respiratory failure d/t volume overload and UTI. Also had elevated tropponins most likely r/t ESRD. Now patient cannot eat or take PO meds. HD was scheduled for 6/26 but could not complete d/t lethargy and fistula complications.   PMT consulted for [place].  Assessment: I called the patient's son, [name], to update him on patient's decline.    We discussed her current illness and what it means in the larger context of her on-going co-morbidities.  Natural disease trajectory and expectations at EOL were discussed. Specifically we discussed her overall failure to thrive, lethargy, and no PO intake. Discussed inability to complete HD. He understands we may be nearing the end of life. He brings up hospice and asks if it is time for that. He asks if her death will be peaceful so we discussed EOL expectations. We discussed cessation of dialysis and transfer to residential hospice.   He shares how this is  not his mother - she is a vibrant lady. We discussed that we are in a different place and her body may be showing [place] that she is near the end of her life. He shares "I knew she was declining but I did not think it would be so fast". He is tearful throughout conversation but thankful for information. He would like to see how today goes but agrees that if she continues the way she is the best plan of care is one focused on comfort.     Interested in transfer to residential hospice tomorrow if she continue to refuse PO intake.   Questions and concerns were addressed. The family was encouraged to call with questions or concerns.   Recommendations/Plan:  Monitor for today, will follow up tomorrow and likely transition to comfort care, transfer to residential hospice facility tomorrow  Code Status:  DNR  Prognosis:   < 2 weeks is likely due to no PO intake, likely cessation of HD  Discharge Planning:  To Be Determined likely hospice facility  Care plan was discussed with Dr. Posey Pronto, nursing staff,  patient's son, [name]  Thank you for allowing the Palliative Medicine Team to assist in the care of this patient.   Total Time 40 minutes Prolonged Time Billed  no       Greater than 50%  of this time was spent counseling and coordinating care related to the above assessment and plan.  Juel Burrow, DNP, [facility] Palliative Medicine Team Team Phone # [phone]  Pager [phone]

## 2017-07-01 NOTE — Progress Notes (Signed)
[facility] [place] NAME: [name]    MR#:  [id number]  DATE OF BIRTH:  1920-11-04  SUBJECTIVE:   Pt has declined overall int 1-2 days. No po intake, very lethargic. unresponisve REVIEW OF SYSTEMS:   Review of Systems  Unable to perform ROS: Patient unresponsive     DRUG ALLERGIES:   Allergies  Allergen Reactions  . Nitrofurantoin Itching  . Ace Inhibitors   . Captopril Other (See Comments)    REACTION: unspecified  . Enalapril Maleate Cough  . Ramipril Other (See Comments)    REACTION: unspecified  . Sulfa Antibiotics Other (See Comments)    Reaction unknown  . Verapamil Other (See Comments)    REACTION: unspecified    VITALS:  Blood pressure (!) 174/63, pulse (!) 104, temperature 98.6 F (37 C), temperature source Axillary, resp. rate 18, height 5\' 2"  (1.575 m), weight 44 kg (97 lb), SpO2 94 %.  PHYSICAL EXAMINATION:   Physical Exam Limited exam  GENERAL:  84 y.o.-year-old patient lying in the bed with no acute distress. Appears chronically ill thin cachectic EYES: Pupils equal, round, reactive to light and accommodation. No scleral icterus. HEENT: Head atraumatic, normocephalic. Oropharynx and nasopharynx clear. Dry oral mucosa NECK:  Supple, no jugular venous distention. No thyroid enlargement, no tenderness.  LUNGS: Normal breath sounds bilaterally, no wheezing, rales, rhonchi. No use of accessory muscles of respiration.  CARDIOVASCULAR: S1, S2 normal. No murmurs, rubs, or gallops.  ABDOMEN: Soft, nontender, nondistended. Bowel sounds present. No organomegaly or mass.  EXTREMITIES: No cyanosis, clubbing or edema b/l.    NEUROLOGIC:unable to assess PSYCHIATRIC:  patient is unresponsive SKIN: No obvious rash, lesion, or ulcer.   LABORATORY PANEL:  CBC Recent Labs  Lab 06/30/17 1654  WBC 5.9  HGB 11.5*  HCT 34.6*  PLT 193    Chemistries  Recent Labs  Lab 06/27/17 1357  06/30/17 1654  NA 133*   <  > 136  K 3.9   < > 4.7  CL 93*   < > 100  CO2 26   < > 23  GLUCOSE 119*   < > 115*  BUN 34*   < > 37*  CREATININE 3.99*   < > 4.37*  CALCIUM 9.0   < > 8.6*  MG  --   --  2.4  AST 24  --   --   ALT 13*  --   --   ALKPHOS 177*  --   --   BILITOT 1.2  --   --    < > = values in this interval not displayed.   Cardiac Enzymes Recent Labs  Lab 07/01/17 0428  TROPONINI 0.04*   RADIOLOGY:  Ct Head Wo Contrast  Result Date: 06/30/2017 CLINICAL DATA:  Altered mental status EXAM: CT HEAD WITHOUT CONTRAST TECHNIQUE: Contiguous axial images were obtained from the base of the skull through the vertex without intravenous contrast. COMPARISON:  Head CT 04/26/2017 FINDINGS: Brain: There is no mass, hemorrhage or extra-axial collection. There is generalized atrophy without lobar predilection. There is an old right parietal lobe infarct and bilateral old gangliocapsular lacunar infarcts. There is hypoattenuation of the periventricular white matter, most commonly indicating chronic ischemic microangiopathy. Vascular: Atherosclerotic calcification of the vertebral and internal carotid arteries at the skull base. No abnormal hyperdensity of the major intracranial arteries or dural venous sinuses. Skull: The visualized skull base, calvarium and extracranial soft tissues are normal. Sinuses/Orbits: No fluid levels or  advanced mucosal thickening of the visualized paranasal sinuses. No mastoid or middle ear effusion. The orbits are normal. Electronically Signed   By: Ulyses Jarred M.D.   On: 06/30/2017 16:32   ASSESSMENT AND PLAN:   *Acute respiratory failure with hypoxia due to pneumonia and pulmonary edema Most likely secondary to acute fluid overload state from end-stage renal disease/diastolic heart failure Pneumonia ruled out  Continue supplemental oxygen with weaning as tolerated  *Acute fluid overload state  Secondary to end-stage renal disease  Case discussed with nephrology, given multiple  comorbidities and poor prognosis going forward-palliative care consulted-- pt overall declining. D/w son charlie by Palliative care and he is in agreement with Hospice if pt shows no improvement by tomorrow Nephrology following for hemodialysis needs --on hold due to overall decline  *Acute probable UTI Cefdinir twice daily and follow-up on cultures   *Acute elevated troponins  Most likely secondary to chronic renal disease  Continue conservative medical management  *Chronic diabetes mellitus type 2  Sliding scale insulin with active checks per routine  *Chronic benign essential hypertension   *Chronic deconditioning, adult failure to thrive  Continue increased nursing care PRN, aspiration/fall/skin care precautions while in house, Dietary input appreciated, palliative care consulted--now DNR and possible hospice home if no improvement  *severe protein energy/calorie malnutrition Dietary input appreciated  Poor prognosis  D/w dr Juleen China and [facility] Palliative Case discussed with Care Management/Social Worker. Management plans discussed with the patient, family and they are in agreement.  CODE STATUS: DNR   TOTAL TIME TAKING CARE OF THIS PATIENT: *25* minutes.  >50% time spent on counselling and coordination of care  POSSIBLE D/C IN  few* DAYS, DEPENDING ON CLINICAL CONDITION.  Note: This dictation was prepared with Dragon dictation along with smaller phrase technology. Any transcriptional errors that result from this process are unintentional.  Fritzi Mandes M.D on 07/01/2017 at 2:17 PM  Between 7am to 6pm - Pager - [phone]  After 6pm go to [email or web address] - password [email or web address] [place] [facility]  Office  [phone]  CC: Primary care physician; [name], MDPatient ID: [name], female   DOB: October 16, 1920, 84 y.o.   MRN: [id number]

## 2017-07-02 ENCOUNTER — Encounter
Admission: EM | Disposition: A | Discharge status: Skilled Nursing Facility | Payer: Self-pay | Source: Home / Self Care | Attending: Internal Medicine

## 2017-07-02 LAB — CULTURE, BLOOD (ROUTINE X 2)
CULTURE: NO GROWTH
Culture: NO GROWTH
SPECIAL REQUESTS: ADEQUATE
Special Requests: ADEQUATE

## 2017-07-02 LAB — VALPROIC ACID LEVEL: Valproic Acid Lvl: 48 ug/mL — ABNORMAL LOW (ref 50.0–100.0)

## 2017-07-02 LAB — GLUCOSE, CAPILLARY
GLUCOSE-CAPILLARY: 132 mg/dL — AB (ref 70–99)
Glucose-Capillary: 113 mg/dL — ABNORMAL HIGH (ref 70–99)
Glucose-Capillary: 119 mg/dL — ABNORMAL HIGH (ref 70–99)

## 2017-07-02 SURGERY — A/V FISTULAGRAM
Anesthesia: Moderate Sedation | Laterality: Left

## 2017-07-02 MED ORDER — CHLORHEXIDINE GLUCONATE CLOTH 2 % EX PADS
6.0000 | MEDICATED_PAD | Freq: Every day | CUTANEOUS | Status: DC
  Administered 2017-07-04: 6 via TOPICAL

## 2017-07-02 MED ORDER — VALPROATE SODIUM 500 MG/5ML IV SOLN
625.0000 mg | Freq: Two times a day (BID) | INTRAVENOUS | Status: DC
  Administered 2017-07-03 – 2017-07-08 (×11): 625 mg via INTRAVENOUS
  Filled 2017-07-02 (×15): qty 6.25

## 2017-07-02 NOTE — Progress Notes (Signed)
Subjective: Patient remains altered.  In bed with eyes closed.    Objective: Current vital signs: BP (!) 167/70   Pulse 98   Temp 97.6 F (36.4 C) (Axillary)   Resp (!) 23   Ht 5\' 2"  (1.575 m)   Wt 44 kg (97 lb)   SpO2 98%   BMI 17.74 kg/m  Vital signs in last 24 hours: Temp:  [97.6 F (36.4 C)-98.3 F (36.8 C)] 97.6 F (36.4 C) (06/28 1145) Pulse Rate:  [94-104] 98 (06/28 1415) Resp:  [20-36] 23 (06/28 1415) BP: (145-182)/(66-82) 167/70 (06/28 1415) SpO2:  [96 %-98 %] 98 % (06/28 0541)  Intake/Output from previous day: 06/27 0701 - 06/28 0700 In: 609.2 [I.V.:554.2; IV Piggyback:55] Out: 0  Intake/Output this shift: No intake/output data recorded. Nutritional status:  Diet Order           Diet NPO time specified  Diet effective midnight        Diet - low sodium heart healthy          Neurologic Exam: Mental Status: Able to be awakened.  Groans.  Does not follow commands.   Cranial Nerves: II: Pupils equal, round, reactive to light and accommodation III,IV, VI: EOMs grossly intact V,VII: intact corneals IX,X: unable to test XI: unable to test XII: unable to test Motor: Moves all extremities spontaneously Sensory: Appreciates noxious stimuli in all extremities  Lab Results: Basic Metabolic Panel: Recent Labs  Lab 06/27/17 1357 06/28/17 0418 06/28/17 1345 06/30/17 1654  NA 133* 134* 132* 136  K 3.9 4.2 4.2 4.7  CL 93* 97* 95* 100  CO2 26 25 23 23   GLUCOSE 119* 80 116* 115*  BUN 34* 39* 41* 37*  CREATININE 3.99* 4.50* 4.79* 4.37*  CALCIUM 9.0 8.2* 8.2* 8.6*  MG  --   --   --  2.4  PHOS  --   --  5.1* 4.7*    Liver Function Tests: Recent Labs  Lab 06/27/17 1357 06/28/17 1345 06/30/17 1654  AST 24  --   --   ALT 13*  --   --   ALKPHOS 177*  --   --   BILITOT 1.2  --   --   PROT 8.2*  --   --   ALBUMIN 3.3* 2.7* 2.7*   No results for input(s): LIPASE, AMYLASE in the last 168 hours. Recent Labs  Lab 06/30/17 1654  AMMONIA 22     CBC: Recent Labs  Lab 06/27/17 1357 06/28/17 0418 06/28/17 1345 06/30/17 1654  WBC 10.4 7.5 7.3 5.9  NEUTROABS 8.6*  --  6.0  --   HGB 12.4 10.6* 10.7* 11.5*  HCT 37.9 31.7* 32.5* 34.6*  MCV 96.5 96.3 96.4 96.6  PLT 179 147* 154 193    Cardiac Enzymes: Recent Labs  Lab 06/27/17 1357 06/30/17 1654 06/30/17 2156 07/01/17 0428  TROPONINI 0.04* 0.03* 0.03* 0.04*    Lipid Panel: No results for input(s): CHOL, TRIG, HDL, CHOLHDL, VLDL, LDLCALC in the last 168 hours.  CBG: Recent Labs  Lab 07/01/17 0721 07/01/17 1136 07/01/17 1657 07/01/17 2221 07/02/17 0730  GLUCAP 138* 151* 91 131* 132*    Microbiology: Results for orders placed or performed during the hospital encounter of 06/27/17  Blood Culture (routine x 2)     Status: None   Collection Time: 06/27/17  1:59 PM  Result Value Ref Range Status   Specimen Description BLOOD RIGHT ARM  Final   Special Requests   Final    BOTTLES  DRAWN AEROBIC AND ANAEROBIC Blood Culture adequate volume   Culture   Final    NO GROWTH 5 DAYS Performed at [facility], [address] [place], [place] [address]    Report Status 07/02/2017 FINAL  Final  Blood Culture (routine x 2)     Status: None   Collection Time: 06/27/17  1:59 PM  Result Value Ref Range Status   Specimen Description BLOOD RIGHT ARM  Final   Special Requests   Final    BOTTLES DRAWN AEROBIC AND ANAEROBIC Blood Culture adequate volume   Culture   Final    NO GROWTH 5 DAYS Performed at [facility], [address], [place], [place] [address]    Report Status 07/02/2017 FINAL  Final  Urine culture     Status: Abnormal   Collection Time: 06/27/17  3:05 PM  Result Value Ref Range Status   Specimen Description   Final    URINE, RANDOM Performed at [facility], [address] [place], [place] [address]    Special Requests   Final    NONE Performed at [facility], [address] [place], [place] [address]     Culture (A)  Final    >=100,000 COLONIES/mL ESCHERICHIA COLI >=100,000 COLONIES/mL KLEBSIELLA ORNITHINOLYTICA    Report Status 06/30/2017 FINAL  Final   Organism ID, Bacteria ESCHERICHIA COLI (A)  Final   Organism ID, Bacteria KLEBSIELLA ORNITHINOLYTICA (A)  Final      Susceptibility   Escherichia coli - MIC*    AMPICILLIN >=32 RESISTANT Resistant     CEFAZOLIN 8 SENSITIVE Sensitive     CEFTRIAXONE <=1 SENSITIVE Sensitive     CIPROFLOXACIN >=4 RESISTANT Resistant     GENTAMICIN >=16 RESISTANT Resistant     IMIPENEM <=0.25 SENSITIVE Sensitive     NITROFURANTOIN 32 SENSITIVE Sensitive     TRIMETH/SULFA <=20 SENSITIVE Sensitive     AMPICILLIN/SULBACTAM >=32 RESISTANT Resistant     PIP/TAZO <=4 SENSITIVE Sensitive     Extended ESBL NEGATIVE Sensitive     * >=100,000 COLONIES/mL ESCHERICHIA COLI   Klebsiella ornithinolytica - MIC*    AMPICILLIN >=32 RESISTANT Resistant     CEFAZOLIN <=4 SENSITIVE Sensitive     CEFTRIAXONE <=1 SENSITIVE Sensitive     CIPROFLOXACIN <=0.25 SENSITIVE Sensitive     GENTAMICIN <=1 SENSITIVE Sensitive     IMIPENEM <=0.25 SENSITIVE Sensitive     NITROFURANTOIN 32 SENSITIVE Sensitive     TRIMETH/SULFA <=20 SENSITIVE Sensitive     AMPICILLIN/SULBACTAM 4 SENSITIVE Sensitive     PIP/TAZO <=4 SENSITIVE Sensitive     * >=100,000 COLONIES/mL KLEBSIELLA ORNITHINOLYTICA  MRSA PCR Screening     Status: None   Collection Time: 06/27/17  4:54 PM  Result Value Ref Range Status   MRSA by PCR NEGATIVE NEGATIVE Final    Comment:        The GeneXpert MRSA Assay (FDA approved for NASAL specimens only), is one component of a comprehensive MRSA colonization surveillance program. It is not intended to diagnose MRSA infection nor to guide or monitor treatment for MRSA infections. Performed at [facility], [address] [place], [place] [address]     Coagulation Studies: No results for input(s): LABPROT, INR in the last 72 hours.  Imaging: Ct Head Wo  Contrast  Result Date: 06/30/2017 CLINICAL DATA:  Altered mental status EXAM: CT HEAD WITHOUT CONTRAST TECHNIQUE: Contiguous axial images were obtained from the base of the skull through the vertex without intravenous contrast.  COMPARISON:  Head CT 04/26/2017 FINDINGS: Brain: There is no mass, hemorrhage or extra-axial collection. There is generalized atrophy without lobar predilection. There is an old right parietal lobe infarct and bilateral old gangliocapsular lacunar infarcts. There is hypoattenuation of the periventricular white matter, most commonly indicating chronic ischemic microangiopathy. Vascular: Atherosclerotic calcification of the vertebral and internal carotid arteries at the skull base. No abnormal hyperdensity of the major intracranial arteries or dural venous sinuses. Skull: The visualized skull base, calvarium and extracranial soft tissues are normal. Sinuses/Orbits: No fluid levels or advanced mucosal thickening of the visualized paranasal sinuses. No mastoid or middle ear effusion. The orbits are normal. Electronically Signed   By: Ulyses Jarred M.D.   On: 06/30/2017 16:32   Mr Brain Wo Contrast  Result Date: 07/01/2017 CLINICAL DATA:  Multiple medical problems, respiratory failure. Worsening mental status. EXAM: MRI HEAD WITHOUT CONTRAST TECHNIQUE: Multiplanar, multiecho pulse sequences of the brain and surrounding structures were obtained without intravenous contrast. COMPARISON:  CT head most recent 06/30/2017.  MR head 09/19/2015. FINDINGS: The patient was unable to remain motionless for the exam. Small or subtle lesions could be overlooked. Brain: Tiny focus of bright signal in the RIGHT cingulate gyrus is filled to represent susceptibility artifact from chronic hemorrhage, not acute stroke. No visible acute stroke, acute hemorrhage, mass lesion, or extra-axial fluid. Advanced atrophy, hydrocephalus ex vacuo. Extensive white matter disease, both focal and confluent, consistent with  chronic microvascular ischemic change. Remote large vessel infarcts affecting the LEFT frontal and RIGHT posterior temporoparietal regions, noted previously. Vascular: Chronic hemorrhage is seen in the vermis, LEFT posterior temporal lobe, RIGHT thalamus, and RIGHT paramedian frontal lobe. Skull and upper cervical spine: Normal marrow signal. Sinuses/Orbits: No significant sinus fluid. BILATERAL cataract extraction. Other: None. IMPRESSION: Chronic changes as described. Advanced atrophy, chronic microvascular ischemic change, and chronic microhemorrhage. No acute stroke is evident. Electronically Signed   By: Staci Righter M.D.   On: 07/01/2017 15:59    Medications:  I have reviewed the patient's current medications. Scheduled: . amLODipine  10 mg Oral Daily  . calcium carbonate  1 tablet Oral TID WC  . cefdinir  300 mg Oral Q24H  . Chlorhexidine Gluconate Cloth  6 each Topical Q0600  . feeding supplement (ENSURE ENLIVE)  237 mL Oral TID BM  . feeding supplement (PRO-STAT SUGAR FREE 64)  30 mL Oral TID WC  . folic acid  0.5 mg Oral Daily  . Ganciclovir  1 drop Right Eye QID  . heparin  5,000 Units Subcutaneous Q8H  . hydrALAZINE  25 mg Oral QID  . insulin aspart  0-5 Units Subcutaneous QHS  . insulin aspart  0-9 Units Subcutaneous TID WC  . levothyroxine  75 mcg Oral Daily  . metoprolol tartrate  50 mg Oral BID  . multivitamin  1 tablet Oral QHS  . pantoprazole  40 mg Oral BID AC  . polyethylene glycol  17 g Oral Daily  . pravastatin  40 mg Oral Daily  . sodium chloride flush  3 mL Intravenous Q12H  . ascorbic acid  500 mg Oral BID  . zinc sulfate  220 mg Oral Daily    Assessment/Plan: AMS continues.  Patient to have dialysis at request of family.  On Depakote with level of 48.    Recommendations: 1.  Increase Depakote to 625mg  BID 2.  Depakote level in AM   LOS: 5 days   Alexis Goodell, MD Neurology [phone] 07/02/2017  2:23 PM

## 2017-07-02 NOTE — Care Management Important Message (Signed)
Important Message  Patient Details  Name: [name] MRN: [id number] Date of Birth: 1920/09/19   Medicare Important Message Given:  Yes    [name] 07/02/2017, 10:46 AM

## 2017-07-02 NOTE — Clinical Social Work Note (Signed)
Patient meets criteria for hospice home. Son has decided that he would like patient to try one more time of hemodialysis. Son states that if it is unsuccessful he would then like patient transferred to [facility]. CSW will follow for discharge needs.   Friend, [place]

## 2017-07-02 NOTE — Progress Notes (Signed)
[facility] [place] NAME: [name]    MR#:  [id number]  DATE OF BIRTH:  Jan 30, 1920  SUBJECTIVE:   Pt has declined overall int 1-2 days. No po intake, very lethargic. unresponisve REVIEW OF SYSTEMS:   Review of Systems  Unable to perform ROS: Patient unresponsive   DRUG ALLERGIES:   Allergies  Allergen Reactions  . Nitrofurantoin Itching  . Ace Inhibitors   . Captopril Other (See Comments)    REACTION: unspecified  . Enalapril Maleate Cough  . Ramipril Other (See Comments)    REACTION: unspecified  . Sulfa Antibiotics Other (See Comments)    Reaction unknown  . Verapamil Other (See Comments)    REACTION: unspecified    VITALS:  Blood pressure (!) 153/68, pulse 99, temperature 97.6 F (36.4 C), temperature source Axillary, resp. rate (!) 22, height 5\' 2"  (1.575 m), weight 44 kg (97 lb), SpO2 98 %.  PHYSICAL EXAMINATION:   Physical Exam Limited exam  GENERAL:  84 y.o.-year-old patient lying in the bed with no acute distress. Appears chronically ill thin cachectic EYES: Pupils equal, round, reactive to light and accommodation. No scleral icterus. HEENT: Head atraumatic, normocephalic. Oropharynx and nasopharynx clear. Dry oral mucosa NECK:  Supple, no jugular venous distention. No thyroid enlargement, no tenderness.  LUNGS: Normal breath sounds bilaterally, no wheezing, rales, rhonchi. No use of accessory muscles of respiration.  CARDIOVASCULAR: S1, S2 normal. No murmurs, rubs, or gallops.  ABDOMEN: Soft, nontender, nondistended. Bowel sounds present. No organomegaly or mass.  EXTREMITIES: No cyanosis, clubbing or edema b/l.    NEUROLOGIC:unable to assess PSYCHIATRIC:  patient is unresponsive SKIN: No obvious rash, lesion, or ulcer.   LABORATORY PANEL:  CBC Recent Labs  Lab 06/30/17 1654  WBC 5.9  HGB 11.5*  HCT 34.6*  PLT 193    Chemistries  Recent Labs  Lab 06/27/17 1357  06/30/17 1654  NA 133*   < >  136  K 3.9   < > 4.7  CL 93*   < > 100  CO2 26   < > 23  GLUCOSE 119*   < > 115*  BUN 34*   < > 37*  CREATININE 3.99*   < > 4.37*  CALCIUM 9.0   < > 8.6*  MG  --   --  2.4  AST 24  --   --   ALT 13*  --   --   ALKPHOS 177*  --   --   BILITOT 1.2  --   --    < > = values in this interval not displayed.   Cardiac Enzymes Recent Labs  Lab 07/01/17 0428  TROPONINI 0.04*   RADIOLOGY:  Ct Head Wo Contrast  Result Date: 06/30/2017 CLINICAL DATA:  Altered mental status EXAM: CT HEAD WITHOUT CONTRAST TECHNIQUE: Contiguous axial images were obtained from the base of the skull through the vertex without intravenous contrast. COMPARISON:  Head CT 04/26/2017 FINDINGS: Brain: There is no mass, hemorrhage or extra-axial collection. There is generalized atrophy without lobar predilection. There is an old right parietal lobe infarct and bilateral old gangliocapsular lacunar infarcts. There is hypoattenuation of the periventricular white matter, most commonly indicating chronic ischemic microangiopathy. Vascular: Atherosclerotic calcification of the vertebral and internal carotid arteries at the skull base. No abnormal hyperdensity of the major intracranial arteries or dural venous sinuses. Skull: The visualized skull base, calvarium and extracranial soft tissues are normal. Sinuses/Orbits: No fluid levels or advanced mucosal  thickening of the visualized paranasal sinuses. No mastoid or middle ear effusion. The orbits are normal. Electronically Signed   By: Ulyses Jarred M.D.   On: 06/30/2017 16:32   Mr Brain Wo Contrast  Result Date: 07/01/2017 CLINICAL DATA:  Multiple medical problems, respiratory failure. Worsening mental status. EXAM: MRI HEAD WITHOUT CONTRAST TECHNIQUE: Multiplanar, multiecho pulse sequences of the brain and surrounding structures were obtained without intravenous contrast. COMPARISON:  CT head most recent 06/30/2017.  MR head 09/19/2015. FINDINGS: The patient was unable to remain  motionless for the exam. Small or subtle lesions could be overlooked. Brain: Tiny focus of bright signal in the RIGHT cingulate gyrus is filled to represent susceptibility artifact from chronic hemorrhage, not acute stroke. No visible acute stroke, acute hemorrhage, mass lesion, or extra-axial fluid. Advanced atrophy, hydrocephalus ex vacuo. Extensive white matter disease, both focal and confluent, consistent with chronic microvascular ischemic change. Remote large vessel infarcts affecting the LEFT frontal and RIGHT posterior temporoparietal regions, noted previously. Vascular: Chronic hemorrhage is seen in the vermis, LEFT posterior temporal lobe, RIGHT thalamus, and RIGHT paramedian frontal lobe. Skull and upper cervical spine: Normal marrow signal. Sinuses/Orbits: No significant sinus fluid. BILATERAL cataract extraction. Other: None. IMPRESSION: Chronic changes as described. Advanced atrophy, chronic microvascular ischemic change, and chronic microhemorrhage. No acute stroke is evident. Electronically Signed   By: Staci Righter M.D.   On: 07/01/2017 15:59   ASSESSMENT AND PLAN:   *Acute respiratory failure with hypoxia due to pneumonia and pulmonary edema Most likely secondary to acute fluid overload state from end-stage renal disease/diastolic heart failure Pneumonia ruled out  Continue supplemental oxygen with weaning as tolerated  *Acute fluid overload state  Secondary to end-stage renal disease  Case discussed with nephrology, given multiple comorbidities and poor prognosis going forward-palliative care consulted-- pt overall declining. D/w son charlie by Palliative care and he is in agreement with Hospice if pt shows no improvement with HD Nephrology following for hemodialysis needs. Pt getting HD today per son's request  *Acute probable UTI Cefdinir twice daily and follow-up UC kleibseilla and e coli  *Chronic diabetes mellitus type 2  Sliding scale insulin with active checks per  routine   *Chronic deconditioning, adult failure to thrive  Continue increased nursing care PRN, aspiration/fall/skin care precautions while in house, Dietary input appreciated, palliative care consulted--now DNR and possible hospice home if no improvement  *severe protein energy/calorie malnutrition Dietary input appreciated  Poor prognosis.    D/w dr Juleen China and [facility] Palliative  Case discussed with Care Management/Social Worker.  CODE STATUS: DNR   TOTAL TIME TAKING CARE OF THIS PATIENT: *25* minutes.  >50% time spent on counselling and coordination of care  POSSIBLE D/C IN  few* DAYS, DEPENDING ON CLINICAL CONDITION.  Note: This dictation was prepared with Dragon dictation along with smaller phrase technology. Any transcriptional errors that result from this process are unintentional.  Fritzi Mandes M.D on 07/02/2017 at 1:42 PM  Between 7am to 6pm - Pager - [phone]  After 6pm go to [email or web address] - password [email or web address] [place] [facility]  Office  [phone]  CC: Primary care physician; [name], MDPatient ID: [name], female   DOB: October 22, 1920, 84 y.o.   MRN: [id number]

## 2017-07-02 NOTE — Progress Notes (Signed)
Daily Progress Note   Patient Name: [name]       Date: 07/02/2017 DOB: 07/20/1920  Age: 84 y.o. MRN#: [id number] Attending Physician: Fritzi Mandes, MD Primary Care Physician: [name], MD Admit Date: 06/27/2017  Reason for Consultation/Follow-up: Establishing goals of care, Hospice Evaluation and Psychosocial/spiritual support  Subjective: Patient opens eyes. Does not respond to me. Unable to follow commands. Per nursing, will not eat or take meds.   Length of Stay: 5  Current Medications: Scheduled Meds:  . amLODipine  10 mg Oral Daily  . calcium carbonate  1 tablet Oral TID WC  . cefdinir  300 mg Oral Q24H  . Chlorhexidine Gluconate Cloth  6 each Topical Q0600  . feeding supplement (ENSURE ENLIVE)  237 mL Oral TID BM  . feeding supplement (PRO-STAT SUGAR FREE 64)  30 mL Oral TID WC  . folic acid  0.5 mg Oral Daily  . Ganciclovir  1 drop Right Eye QID  . heparin  5,000 Units Subcutaneous Q8H  . hydrALAZINE  25 mg Oral QID  . insulin aspart  0-5 Units Subcutaneous QHS  . insulin aspart  0-9 Units Subcutaneous TID WC  . levothyroxine  75 mcg Oral Daily  . metoprolol tartrate  50 mg Oral BID  . multivitamin  1 tablet Oral QHS  . pantoprazole  40 mg Oral BID AC  . polyethylene glycol  17 g Oral Daily  . pravastatin  40 mg Oral Daily  . sodium chloride flush  3 mL Intravenous Q12H  . ascorbic acid  500 mg Oral BID  . zinc sulfate  220 mg Oral Daily    Continuous Infusions: . sodium chloride Stopped (06/28/17 1900)  . dextrose 40 mL/hr at 07/02/17 0828  . valproate sodium Stopped (07/02/17 4503)    PRN Meds: sodium chloride, acetaminophen **OR** acetaminophen, albuterol, bisacodyl, guaiFENesin-dextromethorphan, HYDROcodone-acetaminophen, ipratropium-albuterol, ondansetron  **OR** ondansetron (ZOFRAN) IV, polyvinyl alcohol, senna-docusate, sodium chloride flush  Physical Exam   Constitutional: No distress.  Frail  HENT:  Head: Normocephalic and atraumatic.  Cardiovascular:  regular rhythm, tachycardic  Pulmonary/Chest: Effort normal. No respiratory distress.  Breath sounds clear anteriorly  Abdominal: Soft. Bowel sounds are normal.  Neurological:  Does not respond to voice or touch Skin: Skin is warm and dry.   Vital Signs: BP (!) 176/66 (BP Location: Right Wrist)   Pulse (!) 104   Temp 98 F (36.7 C) (Oral)   Resp 20   Ht 5\' 2"  (1.575 m)   Wt 44 kg (97 lb)   SpO2 98%   BMI 17.74 kg/m  SpO2: SpO2: 98 % O2 Device: O2 Device: Nasal Cannula O2 Flow Rate: O2 Flow Rate (L/min): 2 L/min  Intake/output summary:   Intake/Output Summary (Last 24 hours) at 07/02/2017 0935 Last data filed at 07/01/2017 1846 Gross per 24 hour  Intake 609.17 ml  Output -  Net 609.17 ml   LBM: Last BM Date: (pt not talking) Baseline Weight: Weight: 44 kg (97 lb) Most recent weight: Weight: 44 kg (97 lb)       Palliative Assessment/Data: PPS 10%    Flowsheet Rows     Most Recent Value  Intake Tab  Referral Department  Hospitalist  Unit at Time of Referral  Med/Surg Unit  Palliative Care Primary Diagnosis  Cardiac  Date Notified  06/28/17  Palliative Care Type  New Palliative care  Reason for referral  Clarify Goals of Care  Date of Admission  06/27/17  Date first seen by Palliative Care  06/29/17  # of days Palliative referral response time  1 Day(s)  # of days IP prior to Palliative referral  1  Clinical Assessment  Palliative Performance Scale Score  10%  Psychosocial & Spiritual Assessment  Palliative Care Outcomes  Patient/Family meeting held?  Yes  Who was at the meeting?  [name]  Palliative Care Outcomes  Clarified goals of care      Patient Active Problem List   Diagnosis Date Noted  . Failure to thrive in adult   . Goals of care,  counseling/discussion   . Palliative care by specialist   . Acute respiratory failure with hypoxia ([place]) 06/27/2017  . Pressure injury of skin 05/19/2017  . GIB (gastrointestinal bleeding) 05/17/2017  . GI bleed 05/05/2017  . Pelvic fracture ([place]) 04/26/2017  . Protein-calorie malnutrition, severe 01/07/2017  . CHF (congestive heart failure) ([place]) 01/03/2017  . UTI (urinary tract infection) 01/03/2017  . Acute metabolic encephalopathy 13/24/4010  . Cerebrovascular accident (CVA) due to occlusion of right middle cerebral artery ([place]) 10/24/2015  . Malnutrition of mild degree ([place]) 10/24/2015  . Altered mental status 09/18/2015  . Preventative health care 05/03/2014  . Peripheral vascular disease ([place]) 12/21/2013  . Toe amputation status ([place]) 08/29/2013  . History of recent fall 04/18/2013  . HTN (hypertension), malignant 03/22/2013  . S/P aortic valve replacement with bioprosthetic valve 11/15/2012  . S/P CABG x 3 11/15/2012  . Anemia of chronic kidney failure 12/22/2010  . Thrombocytopenia ([place]) 12/13/2010  . End stage renal disease on dialysis ([place]) 12/13/2010  . S/P aortic valve replacement 08/11/2010  . Arterial insufficiency-lower 07/25/2010  . NEURODERMATITIS 04/08/2009  . Carotid arterial disease ([place]) 01/01/2009  . Coronary atherosclerosis 02/11/2007  . AORTIC STENOSIS 10/04/2006  . ANXIETY 05/30/2006  . Pulmonary hypertension ([place]) 05/30/2006  . Hypothyroidism 05/27/2006  . Type 2 diabetes mellitus with renal manifestations, controlled ([place]) 05/27/2006  . Hyperlipidemia 05/27/2006  . Essential hypertension 05/27/2006  . GERD 05/27/2006  . OSTEOARTHRITIS 05/27/2006  . OSTEOPENIA 05/27/2006  . URINARY INCONTINENCE 05/27/2006    Palliative Care Assessment & Plan   HPI: 84 y.o. female  with past medical history of ESRD on HD, CAD, diastolic HF, AVR 2725, DM, HTN, hypothyroidism, PVD, pulm htn,  admitted on 06/27/2017 with respiratory failure d/t volume overload and  UTI. Also had elevated tropponins most likely r/t ESRD. Now patient cannot eat or take PO meds. HD was scheduled for 6/26 but could not complete d/t lethargy and fistula complications.   PMT consulted for [place] [place].  Assessment: I called the patient's son, [name], to update him on patient's decline. He was able to come to the hospital and see his mom yesterday. He understands she is declining and recognizes she may be near end of life. He tells me "I'm grasping at straws". He asks is we could attempt one more HD treatment since she has not had HD since Mon - he is wondering if this will help mental status. He is hopeful for improvement, however if she does not improve or continues to decline he agrees that residential hospice is most appropriate for her.    During our discussion yesterday, we discussed her current illness and  what it means in the larger context of her on-going co-morbidities.  Natural disease trajectory and expectations at EOL were discussed. Specifically we discussed her overall failure to thrive, lethargy, and no PO intake. Discussed inability to complete HD. He understands we may be nearing the end of life. He brings up hospice and asks if it is time for that. He asks if her death will be peaceful so we discussed EOL expectations. We discussed cessation of dialysis and transfer to residential hospice.   He shares how this is not his mother - she is a vibrant lady. We discussed that we are in a different place and her body may be showing [place] that she is near the end of her life. He shares "I knew she was declining but I did not think it would be so fast". He is tearful throughout conversation but thankful for information. He would like to see how today goes but agrees that if she continues the way she is the best plan of care is one focused on comfort.     Questions and concerns were addressed. The family was encouraged to call with questions or concerns.   Recommendations/Plan:  Attempt  HD today, if no improvement transition to hospice home and full comfort care  PMT will f/u if still here next week  Code Status:  DNR  Prognosis:   < 2 weeks is likely due to no PO intake, ESRD  Discharge Planning:  To Be Determined likely hospice facility  Care plan was discussed with Dr. Posey Pronto, nursing staff, patient's son, [name], Dr. Juleen China  Thank you for allowing the Palliative Medicine Team to assist in the care of this patient.   Total Time 40 minutes Prolonged Time Billed  no       Greater than 50%  of this time was spent counseling and coordinating care related to the above assessment and plan.  Juel Burrow, DNP, [facility] Palliative Medicine Team Team Phone # [phone]  Pager [phone]

## 2017-07-02 NOTE — Progress Notes (Addendum)
[facility]  ROUNDING NOTE   Subjective:   Patient was able to open her eyes and make eye contact but unable to answer any questions.  Per palliative medicine, the son is wanting to perform a dialysis treatment today.   Objective:  Vital signs in last 24 hours:  Temp:  [98 F (36.7 C)-98.6 F (37 C)] 98 F (36.7 C) (06/28 0541) Pulse Rate:  [103-104] 104 (06/28 0541) Resp:  [18-30] 20 (06/28 0541) BP: (144-182)/(63-66) 176/66 (06/28 0541) SpO2:  [94 %-98 %] 98 % (06/28 0541)  Weight change:  Filed Weights   06/27/17 1347 06/28/17 1104 06/28/17 1409  Weight: 44 kg (97 lb) 44.5 kg (98 lb 1.7 oz) 44 kg (97 lb)    Intake/Output: I/O last 3 completed shifts: In: 612.2 [I.V.:557.2; IV Piggyback:55] Out: 0    Intake/Output this shift:  No intake/output data recorded.  Physical Exam: General: NAD,   Head: Normocephalic, atraumatic. Moist oral mucosal membranes  Eyes: Anicteric, PERRL  Neck: Supple, trachea midline  Lungs:  Clear to auscultation  Heart: Regular rate and rhythm  Abdomen:  Soft, nontender,   Extremities:  No peripheral edema.  Neurologic: Nonfocal, responds to verbal stimuli  Skin: No lesions  Access: Left AVF     Basic Metabolic Panel: Recent Labs  Lab 06/27/17 1357 06/28/17 0418 06/28/17 1345 06/30/17 1654  NA 133* 134* 132* 136  K 3.9 4.2 4.2 4.7  CL 93* 97* 95* 100  CO2 26 25 23 23   GLUCOSE 119* 80 116* 115*  BUN 34* 39* 41* 37*  CREATININE 3.99* 4.50* 4.79* 4.37*  CALCIUM 9.0 8.2* 8.2* 8.6*  MG  --   --   --  2.4  PHOS  --   --  5.1* 4.7*    Liver Function Tests: Recent Labs  Lab 06/27/17 1357 06/28/17 1345 06/30/17 1654  AST 24  --   --   ALT 13*  --   --   ALKPHOS 177*  --   --   BILITOT 1.2  --   --   PROT 8.2*  --   --   ALBUMIN 3.3* 2.7* 2.7*   No results for input(s): LIPASE, AMYLASE in the last 168 hours. Recent Labs  Lab 06/30/17 1654  AMMONIA 22    CBC: Recent Labs  Lab 06/27/17 1357  06/28/17 0418 06/28/17 1345 06/30/17 1654  WBC 10.4 7.5 7.3 5.9  NEUTROABS 8.6*  --  6.0  --   HGB 12.4 10.6* 10.7* 11.5*  HCT 37.9 31.7* 32.5* 34.6*  MCV 96.5 96.3 96.4 96.6  PLT 179 147* 154 193    Cardiac Enzymes: Recent Labs  Lab 06/27/17 1357 06/30/17 1654 06/30/17 2156 07/01/17 0428  TROPONINI 0.04* 0.03* 0.03* 0.04*    BNP: Invalid input(s): POCBNP  CBG: Recent Labs  Lab 07/01/17 0721 07/01/17 1136 07/01/17 1657 07/01/17 2221 07/02/17 0730  GLUCAP 138* 151* 91 131* 132*    Microbiology: Results for orders placed or performed during the hospital encounter of 06/27/17  Blood Culture (routine x 2)     Status: None   Collection Time: 06/27/17  1:59 PM  Result Value Ref Range Status   Specimen Description BLOOD RIGHT ARM  Final   Special Requests   Final    BOTTLES DRAWN AEROBIC AND ANAEROBIC Blood Culture adequate volume   Culture   Final    NO GROWTH 5 DAYS Performed at [facility], [address], [place], [place] [address]    Report  Status 07/02/2017 FINAL  Final  Blood Culture (routine x 2)     Status: None   Collection Time: 06/27/17  1:59 PM  Result Value Ref Range Status   Specimen Description BLOOD RIGHT ARM  Final   Special Requests   Final    BOTTLES DRAWN AEROBIC AND ANAEROBIC Blood Culture adequate volume   Culture   Final    NO GROWTH 5 DAYS Performed at [facility], [address] [place], [place] [address]    Report Status 07/02/2017 FINAL  Final  Urine culture     Status: Abnormal   Collection Time: 06/27/17  3:05 PM  Result Value Ref Range Status   Specimen Description   Final    URINE, RANDOM Performed at [facility], [address] [place], [place] [address]    Special Requests   Final    NONE Performed at [facility], [address] [place], [place] [address]    Culture (A)  Final    >=100,000 COLONIES/mL ESCHERICHIA COLI >=100,000 COLONIES/mL KLEBSIELLA ORNITHINOLYTICA     Report Status 06/30/2017 FINAL  Final   Organism ID, Bacteria ESCHERICHIA COLI (A)  Final   Organism ID, Bacteria KLEBSIELLA ORNITHINOLYTICA (A)  Final      Susceptibility   Escherichia coli - MIC*    AMPICILLIN >=32 RESISTANT Resistant     CEFAZOLIN 8 SENSITIVE Sensitive     CEFTRIAXONE <=1 SENSITIVE Sensitive     CIPROFLOXACIN >=4 RESISTANT Resistant     GENTAMICIN >=16 RESISTANT Resistant     IMIPENEM <=0.25 SENSITIVE Sensitive     NITROFURANTOIN 32 SENSITIVE Sensitive     TRIMETH/SULFA <=20 SENSITIVE Sensitive     AMPICILLIN/SULBACTAM >=32 RESISTANT Resistant     PIP/TAZO <=4 SENSITIVE Sensitive     Extended ESBL NEGATIVE Sensitive     * >=100,000 COLONIES/mL ESCHERICHIA COLI   Klebsiella ornithinolytica - MIC*    AMPICILLIN >=32 RESISTANT Resistant     CEFAZOLIN <=4 SENSITIVE Sensitive     CEFTRIAXONE <=1 SENSITIVE Sensitive     CIPROFLOXACIN <=0.25 SENSITIVE Sensitive     GENTAMICIN <=1 SENSITIVE Sensitive     IMIPENEM <=0.25 SENSITIVE Sensitive     NITROFURANTOIN 32 SENSITIVE Sensitive     TRIMETH/SULFA <=20 SENSITIVE Sensitive     AMPICILLIN/SULBACTAM 4 SENSITIVE Sensitive     PIP/TAZO <=4 SENSITIVE Sensitive     * >=100,000 COLONIES/mL KLEBSIELLA ORNITHINOLYTICA  MRSA PCR Screening     Status: None   Collection Time: 06/27/17  4:54 PM  Result Value Ref Range Status   MRSA by PCR NEGATIVE NEGATIVE Final    Comment:        The GeneXpert MRSA Assay (FDA approved for NASAL specimens only), is one component of a comprehensive MRSA colonization surveillance program. It is not intended to diagnose MRSA infection nor to guide or monitor treatment for MRSA infections. Performed at [facility], [address] [place], [place] [address]     Coagulation Studies: No results for input(s): LABPROT, INR in the last 72 hours.  Urinalysis: No results for input(s): COLORURINE, LABSPEC, PHURINE, GLUCOSEU, HGBUR, BILIRUBINUR, KETONESUR, PROTEINUR, UROBILINOGEN,  NITRITE, LEUKOCYTESUR in the last 72 hours.  Invalid input(s): APPERANCEUR    Imaging: Ct Head Wo Contrast  Result Date: 06/30/2017 CLINICAL DATA:  Altered mental status EXAM: CT HEAD WITHOUT CONTRAST TECHNIQUE: Contiguous axial images were obtained from the base of the skull through the vertex without intravenous contrast. COMPARISON:  Head CT 04/26/2017 FINDINGS: Brain: There is  no mass, hemorrhage or extra-axial collection. There is generalized atrophy without lobar predilection. There is an old right parietal lobe infarct and bilateral old gangliocapsular lacunar infarcts. There is hypoattenuation of the periventricular white matter, most commonly indicating chronic ischemic microangiopathy. Vascular: Atherosclerotic calcification of the vertebral and internal carotid arteries at the skull base. No abnormal hyperdensity of the major intracranial arteries or dural venous sinuses. Skull: The visualized skull base, calvarium and extracranial soft tissues are normal. Sinuses/Orbits: No fluid levels or advanced mucosal thickening of the visualized paranasal sinuses. No mastoid or middle ear effusion. The orbits are normal. Electronically Signed   By: Ulyses Jarred M.D.   On: 06/30/2017 16:32   Mr Brain Wo Contrast  Result Date: 07/01/2017 CLINICAL DATA:  Multiple medical problems, respiratory failure. Worsening mental status. EXAM: MRI HEAD WITHOUT CONTRAST TECHNIQUE: Multiplanar, multiecho pulse sequences of the brain and surrounding structures were obtained without intravenous contrast. COMPARISON:  CT head most recent 06/30/2017.  MR head 09/19/2015. FINDINGS: The patient was unable to remain motionless for the exam. Small or subtle lesions could be overlooked. Brain: Tiny focus of bright signal in the RIGHT cingulate gyrus is filled to represent susceptibility artifact from chronic hemorrhage, not acute stroke. No visible acute stroke, acute hemorrhage, mass lesion, or extra-axial fluid. Advanced  atrophy, hydrocephalus ex vacuo. Extensive white matter disease, both focal and confluent, consistent with chronic microvascular ischemic change. Remote large vessel infarcts affecting the LEFT frontal and RIGHT posterior temporoparietal regions, noted previously. Vascular: Chronic hemorrhage is seen in the vermis, LEFT posterior temporal lobe, RIGHT thalamus, and RIGHT paramedian frontal lobe. Skull and upper cervical spine: Normal marrow signal. Sinuses/Orbits: No significant sinus fluid. BILATERAL cataract extraction. Other: None. IMPRESSION: Chronic changes as described. Advanced atrophy, chronic microvascular ischemic change, and chronic microhemorrhage. No acute stroke is evident. Electronically Signed   By: Staci Righter M.D.   On: 07/01/2017 15:59     Medications:   . sodium chloride Stopped (06/28/17 1900)  . dextrose 40 mL/hr at 07/02/17 0828  . valproate sodium Stopped (07/02/17 4098)   . amLODipine  10 mg Oral Daily  . calcium carbonate  1 tablet Oral TID WC  . cefdinir  300 mg Oral Q24H  . Chlorhexidine Gluconate Cloth  6 each Topical Q0600  . feeding supplement (ENSURE ENLIVE)  237 mL Oral TID BM  . feeding supplement (PRO-STAT SUGAR FREE 64)  30 mL Oral TID WC  . folic acid  0.5 mg Oral Daily  . Ganciclovir  1 drop Right Eye QID  . heparin  5,000 Units Subcutaneous Q8H  . hydrALAZINE  25 mg Oral QID  . insulin aspart  0-5 Units Subcutaneous QHS  . insulin aspart  0-9 Units Subcutaneous TID WC  . levothyroxine  75 mcg Oral Daily  . metoprolol tartrate  50 mg Oral BID  . multivitamin  1 tablet Oral QHS  . pantoprazole  40 mg Oral BID AC  . polyethylene glycol  17 g Oral Daily  . pravastatin  40 mg Oral Daily  . sodium chloride flush  3 mL Intravenous Q12H  . ascorbic acid  500 mg Oral BID  . zinc sulfate  220 mg Oral Daily   sodium chloride, acetaminophen **OR** acetaminophen, albuterol, bisacodyl, guaiFENesin-dextromethorphan, HYDROcodone-acetaminophen,  ipratropium-albuterol, ondansetron **OR** ondansetron (ZOFRAN) IV, polyvinyl alcohol, senna-docusate, sodium chloride flush  Assessment/ Plan:  [name] is a 84 y.o. white  female with end stage renal disease on hemodialysis, coronary artery disease, history of  bilateral knee replacement, aortic valve replacement 2009, diabetes, hypertension, hypothyroidism, peripheral vascular disease, pulmonary hypertension, anxiety was admitted on   [facility] [address]/MWF  1. End Stage Renal Disease: with complication of dialysis access. Appreciate vascular input. Patient was scheduled for fistulogram for today 6/28.  - Spoke with palliative care, they report that son is not interested in the fistulogram being performed.  - Will perform HD treatment with patient lying in bed.  - Will cancel fistulogram   2. Anemia of chronic kidney disease: hemoglobin 11.5.  - mircera as outpatient   3. Secondary Hyperparathyroidism: phosphorus and calcium at goal.  - Patient is not eating or tolerating anything PO  4. Hypertension: mildly elevated. Not able to tolerate PO medications - last was given 6/25.  - amlodipine, hydralazine, and metoprolol.   5. Altered mental status: CT negative.  - Appreciate neurology input.  - EEG was performed yesterday, abnormal     LOS: [place] 6/28/201910:00 AM   Attending Addendum Patient seen and examined with physician assistant. Agree with above plan.  Patient tolerated hemodialysis this afternoon. However mental status has remained unchanged.   Etan Vasudevan 6/28/20195:33 PM

## 2017-07-02 NOTE — Progress Notes (Signed)
PT Cancellation Note  Patient Details Name: [name] MRN: [id number] DOB: 1920/04/18   Cancelled Treatment:    Reason Eval/Treat Not Completed: Other (comment).  PT has attempted to see pt 4 days in a row but pt's status/ability to participate has not improved.  Per discussion with nurse, will discontinue current PT order.  Please re-consult PT if pt's status improves and pt able/appropriate to participate in PT.  Leitha Bleak, PT 07/02/17, 10:50 AM [phone]

## 2017-07-02 NOTE — Progress Notes (Signed)
This note also relates to the following rows which could not be included: Pulse Rate - Cannot attach notes to unvalidated device data Resp - Cannot attach notes to unvalidated device data BP - Cannot attach notes to unvalidated device data  Hd completed  

## 2017-07-02 NOTE — Progress Notes (Signed)
This note also relates to the following rows which could not be included: Pulse Rate - Cannot attach notes to unvalidated device data Resp - Cannot attach notes to unvalidated device data BP - Cannot attach notes to unvalidated device data  Hd started  

## 2017-07-03 LAB — GLUCOSE, CAPILLARY
GLUCOSE-CAPILLARY: 132 mg/dL — AB (ref 70–99)
GLUCOSE-CAPILLARY: 134 mg/dL — AB (ref 70–99)
Glucose-Capillary: 176 mg/dL — ABNORMAL HIGH (ref 70–99)
Glucose-Capillary: 133 mg/dL — ABNORMAL HIGH (ref 70–99)

## 2017-07-03 LAB — VALPROIC ACID LEVEL: VALPROIC ACID LVL: 27 ug/mL — AB (ref 50.0–100.0)

## 2017-07-03 NOTE — Progress Notes (Signed)
[facility]  ROUNDING NOTE   Subjective:   Hemodialysis treatment yesterday. Tolerated treatment well in a bed. UF of 0.5 Liter  Son at bedside. States mental status has improved. Eating better.   Objective:  Vital signs in last 24 hours:  Temp:  [97.6 F (36.4 C)-98.4 F (36.9 C)] 97.7 F (36.5 C) (06/29 0451) Pulse Rate:  [93-105] 93 (06/29 0451) Resp:  [20-36] 20 (06/29 0451) BP: (145-185)/(65-82) 176/65 (06/29 0451) SpO2:  [94 %-96 %] 94 % (06/29 0451)  Weight change:  Filed Weights   06/27/17 1347 06/28/17 1104 06/28/17 1409  Weight: 44 kg (97 lb) 44.5 kg (98 lb 1.7 oz) 44 kg (97 lb)    Intake/Output: I/O last 3 completed shifts: In: 1295 [I.V.:1295] Out: 500 [Other:500]   Intake/Output this shift:  Total I/O In: 56.3 [IV Piggyback:56.3] Out: -   Physical Exam: General: NAD,   Head: Normocephalic, atraumatic. Moist oral mucosal membranes  Eyes: Anicteric, PERRL  Neck: Supple, trachea midline  Lungs:  Clear to auscultation  Heart: Regular rate and rhythm  Abdomen:  Soft, nontender,   Extremities:  No peripheral edema.  Neurologic: Nonfocal, responds to verbal stimuli  Skin: No lesions  Access: Left AVF     Basic Metabolic Panel: Recent Labs  Lab 06/27/17 1357 06/28/17 0418 06/28/17 1345 06/30/17 1654  NA 133* 134* 132* 136  K 3.9 4.2 4.2 4.7  CL 93* 97* 95* 100  CO2 26 25 23 23   GLUCOSE 119* 80 116* 115*  BUN 34* 39* 41* 37*  CREATININE 3.99* 4.50* 4.79* 4.37*  CALCIUM 9.0 8.2* 8.2* 8.6*  MG  --   --   --  2.4  PHOS  --   --  5.1* 4.7*    Liver Function Tests: Recent Labs  Lab 06/27/17 1357 06/28/17 1345 06/30/17 1654  AST 24  --   --   ALT 13*  --   --   ALKPHOS 177*  --   --   BILITOT 1.2  --   --   PROT 8.2*  --   --   ALBUMIN 3.3* 2.7* 2.7*   No results for input(s): LIPASE, AMYLASE in the last 168 hours. Recent Labs  Lab 06/30/17 1654  AMMONIA 22    CBC: Recent Labs  Lab 06/27/17 1357 06/28/17 0418  06/28/17 1345 06/30/17 1654  WBC 10.4 7.5 7.3 5.9  NEUTROABS 8.6*  --  6.0  --   HGB 12.4 10.6* 10.7* 11.5*  HCT 37.9 31.7* 32.5* 34.6*  MCV 96.5 96.3 96.4 96.6  PLT 179 147* 154 193    Cardiac Enzymes: Recent Labs  Lab 06/27/17 1357 06/30/17 1654 06/30/17 2156 07/01/17 0428  TROPONINI 0.04* 0.03* 0.03* 0.04*    BNP: Invalid input(s): POCBNP  CBG: Recent Labs  Lab 07/01/17 2221 07/02/17 0730 07/02/17 1642 07/02/17 2107 07/03/17 0736  GLUCAP 131* 132* 113* 119* 132*    Microbiology: Results for orders placed or performed during the hospital encounter of 06/27/17  Blood Culture (routine x 2)     Status: None   Collection Time: 06/27/17  1:59 PM  Result Value Ref Range Status   Specimen Description BLOOD RIGHT ARM  Final   Special Requests   Final    BOTTLES DRAWN AEROBIC AND ANAEROBIC Blood Culture adequate volume   Culture   Final    NO GROWTH 5 DAYS Performed at [facility], [address] [place], [place] [address]    Report Status 07/02/2017 FINAL  Final  Blood Culture (routine x 2)     Status: None   Collection Time: 06/27/17  1:59 PM  Result Value Ref Range Status   Specimen Description BLOOD RIGHT ARM  Final   Special Requests   Final    BOTTLES DRAWN AEROBIC AND ANAEROBIC Blood Culture adequate volume   Culture   Final    NO GROWTH 5 DAYS Performed at [facility], [address] [place], [place] [address]    Report Status 07/02/2017 FINAL  Final  Urine culture     Status: Abnormal   Collection Time: 06/27/17  3:05 PM  Result Value Ref Range Status   Specimen Description   Final    URINE, RANDOM Performed at [facility] [place] [facility], [address], [place], [place] [address]    Special Requests   Final    NONE Performed at [facility], [address] [place], [place] [address]    Culture (A)  Final    >=100,000 COLONIES/mL ESCHERICHIA COLI >=100,000 COLONIES/mL KLEBSIELLA ORNITHINOLYTICA    Report Status  06/30/2017 FINAL  Final   Organism ID, Bacteria ESCHERICHIA COLI (A)  Final   Organism ID, Bacteria KLEBSIELLA ORNITHINOLYTICA (A)  Final      Susceptibility   Escherichia coli - MIC*    AMPICILLIN >=32 RESISTANT Resistant     CEFAZOLIN 8 SENSITIVE Sensitive     CEFTRIAXONE <=1 SENSITIVE Sensitive     CIPROFLOXACIN >=4 RESISTANT Resistant     GENTAMICIN >=16 RESISTANT Resistant     IMIPENEM <=0.25 SENSITIVE Sensitive     NITROFURANTOIN 32 SENSITIVE Sensitive     TRIMETH/SULFA <=20 SENSITIVE Sensitive     AMPICILLIN/SULBACTAM >=32 RESISTANT Resistant     PIP/TAZO <=4 SENSITIVE Sensitive     Extended ESBL NEGATIVE Sensitive     * >=100,000 COLONIES/mL ESCHERICHIA COLI   Klebsiella ornithinolytica - MIC*    AMPICILLIN >=32 RESISTANT Resistant     CEFAZOLIN <=4 SENSITIVE Sensitive     CEFTRIAXONE <=1 SENSITIVE Sensitive     CIPROFLOXACIN <=0.25 SENSITIVE Sensitive     GENTAMICIN <=1 SENSITIVE Sensitive     IMIPENEM <=0.25 SENSITIVE Sensitive     NITROFURANTOIN 32 SENSITIVE Sensitive     TRIMETH/SULFA <=20 SENSITIVE Sensitive     AMPICILLIN/SULBACTAM 4 SENSITIVE Sensitive     PIP/TAZO <=4 SENSITIVE Sensitive     * >=100,000 COLONIES/mL KLEBSIELLA ORNITHINOLYTICA  MRSA PCR Screening     Status: None   Collection Time: 06/27/17  4:54 PM  Result Value Ref Range Status   MRSA by PCR NEGATIVE NEGATIVE Final    Comment:        The GeneXpert MRSA Assay (FDA approved for NASAL specimens only), is one component of a comprehensive MRSA colonization surveillance program. It is not intended to diagnose MRSA infection nor to guide or monitor treatment for MRSA infections. Performed at [facility], [address] [place], [place] [address]     Coagulation Studies: No results for input(s): LABPROT, INR in the last 72 hours.  Urinalysis: No results for input(s): COLORURINE, LABSPEC, PHURINE, GLUCOSEU, HGBUR, BILIRUBINUR, KETONESUR, PROTEINUR, UROBILINOGEN, NITRITE,  LEUKOCYTESUR in the last 72 hours.  Invalid input(s): APPERANCEUR    Imaging: Mr Brain Wo Contrast  Result Date: 07/01/2017 CLINICAL DATA:  Multiple medical problems, respiratory failure. Worsening mental status. EXAM: MRI HEAD WITHOUT CONTRAST TECHNIQUE: Multiplanar, multiecho pulse sequences of the brain and surrounding structures were obtained without intravenous contrast. COMPARISON:  CT head most recent 06/30/2017.  MR head  09/19/2015. FINDINGS: The patient was unable to remain motionless for the exam. Small or subtle lesions could be overlooked. Brain: Tiny focus of bright signal in the RIGHT cingulate gyrus is filled to represent susceptibility artifact from chronic hemorrhage, not acute stroke. No visible acute stroke, acute hemorrhage, mass lesion, or extra-axial fluid. Advanced atrophy, hydrocephalus ex vacuo. Extensive white matter disease, both focal and confluent, consistent with chronic microvascular ischemic change. Remote large vessel infarcts affecting the LEFT frontal and RIGHT posterior temporoparietal regions, noted previously. Vascular: Chronic hemorrhage is seen in the vermis, LEFT posterior temporal lobe, RIGHT thalamus, and RIGHT paramedian frontal lobe. Skull and upper cervical spine: Normal marrow signal. Sinuses/Orbits: No significant sinus fluid. BILATERAL cataract extraction. Other: None. IMPRESSION: Chronic changes as described. Advanced atrophy, chronic microvascular ischemic change, and chronic microhemorrhage. No acute stroke is evident. Electronically Signed   By: Staci Righter M.D.   On: 07/01/2017 15:59     Medications:   . sodium chloride Stopped (06/28/17 1900)  . valproate sodium Stopped (07/03/17 0744)   . amLODipine  10 mg Oral Daily  . calcium carbonate  1 tablet Oral TID WC  . cefdinir  300 mg Oral Q24H  . Chlorhexidine Gluconate Cloth  6 each Topical Q0600  . feeding supplement (ENSURE ENLIVE)  237 mL Oral TID BM  . feeding supplement (PRO-STAT SUGAR  FREE 64)  30 mL Oral TID WC  . folic acid  0.5 mg Oral Daily  . Ganciclovir  1 drop Right Eye QID  . heparin  5,000 Units Subcutaneous Q8H  . hydrALAZINE  25 mg Oral QID  . insulin aspart  0-5 Units Subcutaneous QHS  . insulin aspart  0-9 Units Subcutaneous TID WC  . levothyroxine  75 mcg Oral Daily  . metoprolol tartrate  50 mg Oral BID  . multivitamin  1 tablet Oral QHS  . pantoprazole  40 mg Oral BID AC  . polyethylene glycol  17 g Oral Daily  . pravastatin  40 mg Oral Daily  . sodium chloride flush  3 mL Intravenous Q12H  . ascorbic acid  500 mg Oral BID  . zinc sulfate  220 mg Oral Daily   sodium chloride, acetaminophen **OR** acetaminophen, albuterol, bisacodyl, guaiFENesin-dextromethorphan, HYDROcodone-acetaminophen, ipratropium-albuterol, ondansetron **OR** ondansetron (ZOFRAN) IV, polyvinyl alcohol, senna-docusate, sodium chloride flush  Assessment/ Plan:  [name] is a 84 y.o. white  female with end stage renal disease on hemodialysis, coronary artery disease, history of bilateral knee replacement, aortic valve replacement 2009, diabetes, hypertension, hypothyroidism, peripheral vascular disease, pulmonary hypertension, anxiety was admitted on   [facility] [address]/MWF  1. End Stage Renal Disease: with complication of dialysis access. Appreciate vascular input. Patient was scheduled for fistulogram for today 6/28.  Hemodialysis treatment well tolerated yesterday.  However with crackles on examination. Discussed case with son, who believes an extra hemodialysis treatment will help.  Schedule patient for an extra hemodialysis treatment. Orders prepared.   2. Anemia of chronic kidney disease: hemoglobin 11.5.  - mircera as outpatient   3. Secondary Hyperparathyroidism: phosphorus and calcium at goal.  - restart tums   4. Hypertension: elevated. Home regimen of amlodipine, hydralazine, and metoprolol.    LOS: 6 Jailyne Chieffo 6/29/201911:28  AM

## 2017-07-03 NOTE — Progress Notes (Signed)
[facility] [place] NAME: Aleanna Menge    MR#:  [id number]  DATE OF BIRTH:  [birth year]  SUBJECTIVE:   Patient seems to be more awake than yesterday.  Visibly short of breath and confused. Family at bedside  REVIEW OF SYSTEMS:   Review of Systems  Unable to perform ROS: Mental status change   DRUG ALLERGIES:   Allergies  Allergen Reactions  . Nitrofurantoin Itching  . Ace Inhibitors   . Captopril Other (See Comments)    REACTION: unspecified  . Enalapril Maleate Cough  . Ramipril Other (See Comments)    REACTION: unspecified  . Sulfa Antibiotics Other (See Comments)    Reaction unknown  . Verapamil Other (See Comments)    REACTION: unspecified    VITALS:  Blood pressure (!) 148/56, pulse 90, temperature 98 F (36.7 C), temperature source Oral, resp. rate 20, height 5\' 2"  (1.575 m), weight 44 kg (97 lb), SpO2 91 %.  PHYSICAL EXAMINATION:   Physical Exam Limited exam  GENERAL:  84 y.o.-year-old patient lying in the bed with respiratory distress. Appears chronically ill thin cachectic EYES: Pupils equal, round, reactive to light and accommodation. No scleral icterus. HEENT: Head atraumatic, normocephalic. Oropharynx and nasopharynx clear. Dry oral mucosa NECK:  Supple, no jugular venous distention. No thyroid enlargement, no tenderness.  LUNGS: Bilateral coarse breath sounds CARDIOVASCULAR: S1, S2 normal. No murmurs, rubs, or gallops.  ABDOMEN: Soft, nontender, nondistended. Bowel sounds present. No organomegaly or mass.  EXTREMITIES: No cyanosis, clubbing or edema b/l.    NEUROLOGIC:unable to assess PSYCHIATRIC:  patient is lethargic SKIN: No obvious rash, lesion, or ulcer.   LABORATORY PANEL:  CBC Recent Labs  Lab 06/30/17 1654  WBC 5.9  HGB 11.5*  HCT 34.6*  PLT 193    Chemistries  Recent Labs  Lab 06/27/17 1357  06/30/17 1654  NA 133*   < > 136  K 3.9   < > 4.7  CL 93*   < > 100  CO2 26   < > 23   GLUCOSE 119*   < > 115*  BUN 34*   < > 37*  CREATININE 3.99*   < > 4.37*  CALCIUM 9.0   < > 8.6*  MG  --   --  2.4  AST 24  --   --   ALT 13*  --   --   ALKPHOS 177*  --   --   BILITOT 1.2  --   --    < > = values in this interval not displayed.   Cardiac Enzymes Recent Labs  Lab 07/01/17 0428  TROPONINI 0.04*   RADIOLOGY:  Mr Brain Wo Contrast  Result Date: 07/01/2017 CLINICAL DATA:  Multiple medical problems, respiratory failure. Worsening mental status. EXAM: MRI HEAD WITHOUT CONTRAST TECHNIQUE: Multiplanar, multiecho pulse sequences of the brain and surrounding structures were obtained without intravenous contrast. COMPARISON:  CT head most recent 06/30/2017.  MR head 09/19/2015. FINDINGS: The patient was unable to remain motionless for the exam. Small or subtle lesions could be overlooked. Brain: Tiny focus of bright signal in the RIGHT cingulate gyrus is filled to represent susceptibility artifact from chronic hemorrhage, not acute stroke. No visible acute stroke, acute hemorrhage, mass lesion, or extra-axial fluid. Advanced atrophy, hydrocephalus ex vacuo. Extensive white matter disease, both focal and confluent, consistent with chronic microvascular ischemic change. Remote large vessel infarcts affecting the LEFT frontal and RIGHT posterior temporoparietal regions, noted previously. Vascular: Chronic  hemorrhage is seen in the vermis, LEFT posterior temporal lobe, RIGHT thalamus, and RIGHT paramedian frontal lobe. Skull and upper cervical spine: Normal marrow signal. Sinuses/Orbits: No significant sinus fluid. BILATERAL cataract extraction. Other: None. IMPRESSION: Chronic changes as described. Advanced atrophy, chronic microvascular ischemic change, and chronic microhemorrhage. No acute stroke is evident. Electronically Signed   By: Staci Righter M.D.   On: 07/01/2017 15:59   ASSESSMENT AND PLAN:   *Acute respiratory failure with hypoxia due to pneumonia and pulmonary edema Most  likely secondary to acute fluid overload state from end-stage renal disease/diastolic heart failure Pneumonia ruled out  Continue supplemental oxygen with weaning as tolerated  * Acute fluid overload state  Secondary to end-stage renal disease  Some improvement with hemodialysis.  Patient to get hemodialysis again today.  *Acute probable UTI Cefdinir twice daily   UC kleibseilla and e coli  *Chronic diabetes mellitus type 2  Sliding scale insulin with active checks per routine  *Chronic deconditioning, adult failure to thrive  Hospice services at discharge  *severe protein energy/calorie malnutrition Dietary input appreciated  Poor prognosis.    D/w dr Juleen China   Case discussed with Care Management/Social Worker.  CODE STATUS: DNR   TOTAL TIME TAKING CARE OF THIS PATIENT: 25 minutes.   Note: This dictation was prepared with Dragon dictation along with smaller phrase technology. Any transcriptional errors that result from this process are unintentional.  Neita Carp M.D on 07/03/2017 at 12:42 PM  Between 7am to 6pm - Pager - [phone]  After 6pm go to [email or web address] - password [email or web address] [facility]  Office  [phone]  CC: Primary care physician; [name], MDPatient ID: Creola Corn, female   DOB: 05-11-1920, 84 y.o.   MRN: [id number]

## 2017-07-03 NOTE — Progress Notes (Signed)
Son has been with patient most of the day and is very supportive.  Patient is taking in fluids and small amounts of meals and ensure.  Still sleepy at times

## 2017-07-03 NOTE — Progress Notes (Signed)
Advance care planning  Purpose of Encounter Goals of care, worsening respiratory status  Parties in Attendance Patient is unable to participate in discussion.  Healthcare power of attorney son at bedside.  Discussed with son at bedside regarding patient's multiple comorbidities and worsening functional status.  We went through patient's multiple admissions and complications.  Son is hopeful patient would improve with another round of hemodialysis and then [facility].  We discussed regarding setting up hospice services and discharge.  I have advised that we will request case manager consultation. Explained that patient is still acutely ill with high risk for complications due to pulmonary edema.  Poor oral intake.  Poor prognosis. Life expectancy less than 6 months  DO NOT RESUSCITATE and DO NOT INTUBATE  Time spent - 20 minutes

## 2017-07-04 DIAGNOSIS — I120 Hypertensive chronic kidney disease with stage 5 chronic kidney disease or end stage renal disease: Secondary | ICD-10-CM | POA: Diagnosis not present

## 2017-07-04 DIAGNOSIS — N186 End stage renal disease: Secondary | ICD-10-CM | POA: Diagnosis not present

## 2017-07-04 DIAGNOSIS — Z992 Dependence on renal dialysis: Secondary | ICD-10-CM | POA: Diagnosis not present

## 2017-07-04 LAB — CBC
HEMATOCRIT: 35.4 % (ref 35.0–47.0)
HEMOGLOBIN: 11.8 g/dL — AB (ref 12.0–16.0)
MCH: 31.8 pg (ref 26.0–34.0)
MCHC: 33.3 g/dL (ref 32.0–36.0)
MCV: 95.3 fL (ref 80.0–100.0)
Platelets: 176 10*3/uL (ref 150–440)
RBC: 3.72 MIL/uL — AB (ref 3.80–5.20)
RDW: 17.9 % — ABNORMAL HIGH (ref 11.5–14.5)
WBC: 5.2 10*3/uL (ref 3.6–11.0)

## 2017-07-04 LAB — RENAL FUNCTION PANEL
ALBUMIN: 2.3 g/dL — AB (ref 3.5–5.0)
ANION GAP: 7 (ref 5–15)
BUN: 18 mg/dL (ref 8–23)
CALCIUM: 8.2 mg/dL — AB (ref 8.9–10.3)
CO2: 29 mmol/L (ref 22–32)
Chloride: 99 mmol/L (ref 98–111)
Creatinine, Ser: 2.54 mg/dL — ABNORMAL HIGH (ref 0.44–1.00)
GFR, EST AFRICAN AMERICAN: 17 mL/min — AB (ref >60)
GFR, EST NON AFRICAN AMERICAN: 15 mL/min — AB (ref >60)
Glucose, Bld: 117 mg/dL — ABNORMAL HIGH (ref 70–99)
PHOSPHORUS: 2 mg/dL — AB (ref 2.5–4.6)
POTASSIUM: 4.1 mmol/L (ref 3.5–5.1)
SODIUM: 135 mmol/L (ref 135–145)

## 2017-07-04 LAB — GLUCOSE, CAPILLARY
GLUCOSE-CAPILLARY: 90 mg/dL (ref 70–99)
GLUCOSE-CAPILLARY: 137 mg/dL — AB (ref 70–99)
Glucose-Capillary: 94 mg/dL (ref 70–99)

## 2017-07-04 NOTE — Progress Notes (Signed)
This note also relates to the following rows which could not be included: Pulse Rate - Cannot attach notes to unvalidated device data Resp - Cannot attach notes to unvalidated device data BP - Cannot attach notes to unvalidated device data  Hd started  

## 2017-07-04 NOTE — Progress Notes (Signed)
Pre dialysis assessment 

## 2017-07-04 NOTE — Progress Notes (Signed)
[facility]  ROUNDING NOTE   Subjective:   Seen and examined on hemodialysis. Tolerating treatment well.   Alert and oriented x 3    HEMODIALYSIS FLOWSHEET:  Blood Flow Rate (mL/min): 400 mL/min Arterial Pressure (mmHg): -220 mmHg Venous Pressure (mmHg): 220 mmHg Transmembrane Pressure (mmHg): 50 mmHg Ultrafiltration Rate (mL/min): 570 mL/min Dialysate Flow Rate (mL/min): 600 ml/min Conductivity: Machine : 13.6 Conductivity: Machine : 13.6 Dialysis Fluid Bolus: Normal Saline Bolus Amount (mL): 250 mL    Objective:  Vital signs in last 24 hours:  Temp:  [97.5 F (36.4 C)-98.7 F (37.1 C)] 97.5 F (36.4 C) (06/30 1000) Pulse Rate:  [81-95] 95 (06/30 1100) Resp:  [20-29] 25 (06/30 1100) BP: (148-184)/(56-91) 159/57 (06/30 1045) SpO2:  [91 %-95 %] 95 % (06/30 0506)  Weight change:  Filed Weights   06/27/17 1347 06/28/17 1104 06/28/17 1409  Weight: 44 kg (97 lb) 44.5 kg (98 lb 1.7 oz) 44 kg (97 lb)    Intake/Output: I/O last 3 completed shifts: In: 1591.3 [P.O.:240; I.V.:1295; IV Piggyback:56.3] Out: -    Intake/Output this shift:  No intake/output data recorded.  Physical Exam: General: NAD,   Head: Normocephalic, atraumatic. Moist oral mucosal membranes  Eyes: Anicteric, PERRL  Neck: Supple, trachea midline  Lungs:  Clear to auscultation  Heart: Regular rate and rhythm  Abdomen:  Soft, nontender,   Extremities:  No peripheral edema.  Neurologic: Nonfocal, alert and oriented  Skin: No lesions  Access: Left AVF     Basic Metabolic Panel: Recent Labs  Lab 06/27/17 1357 06/28/17 0418 06/28/17 1345 06/30/17 1654 07/04/17 1019  NA 133* 134* 132* 136 135  K 3.9 4.2 4.2 4.7 4.1  CL 93* 97* 95* 100 99  CO2 26 25 23 23 29   GLUCOSE 119* 80 116* 115* 117*  BUN 34* 39* 41* 37* 18  CREATININE 3.99* 4.50* 4.79* 4.37* 2.54*  CALCIUM 9.0 8.2* 8.2* 8.6* 8.2*  MG  --   --   --  2.4  --   PHOS  --   --  5.1* 4.7* 2.0*    Liver Function  Tests: Recent Labs  Lab 06/27/17 1357 06/28/17 1345 06/30/17 1654 07/04/17 1019  AST 24  --   --   --   ALT 13*  --   --   --   ALKPHOS 177*  --   --   --   BILITOT 1.2  --   --   --   PROT 8.2*  --   --   --   ALBUMIN 3.3* 2.7* 2.7* 2.3*   No results for input(s): LIPASE, AMYLASE in the last 168 hours. Recent Labs  Lab 06/30/17 1654  AMMONIA 22    CBC: Recent Labs  Lab 06/27/17 1357 06/28/17 0418 06/28/17 1345 06/30/17 1654 07/04/17 1019  WBC 10.4 7.5 7.3 5.9 5.2  NEUTROABS 8.6*  --  6.0  --   --   HGB 12.4 10.6* 10.7* 11.5* 11.8*  HCT 37.9 31.7* 32.5* 34.6* 35.4  MCV 96.5 96.3 96.4 96.6 95.3  PLT 179 147* 154 193 176    Cardiac Enzymes: Recent Labs  Lab 06/27/17 1357 06/30/17 1654 06/30/17 2156 07/01/17 0428  TROPONINI 0.04* 0.03* 0.03* 0.04*    BNP: Invalid input(s): POCBNP  CBG: Recent Labs  Lab 07/03/17 0736 07/03/17 1149 07/03/17 1626 07/03/17 2132 07/04/17 0735  GLUCAP 132* 134* 176* 133* 66    Microbiology: Results for orders placed or performed during the hospital encounter of 06/27/17  Blood Culture (routine x 2)     Status: None   Collection Time: 06/27/17  1:59 PM  Result Value Ref Range Status   Specimen Description BLOOD RIGHT ARM  Final   Special Requests   Final    BOTTLES DRAWN AEROBIC AND ANAEROBIC Blood Culture adequate volume   Culture   Final    NO GROWTH 5 DAYS Performed at [facility], [address] [place], [place] [address]    Report Status 07/02/2017 FINAL  Final  Blood Culture (routine x 2)     Status: None   Collection Time: 06/27/17  1:59 PM  Result Value Ref Range Status   Specimen Description BLOOD RIGHT ARM  Final   Special Requests   Final    BOTTLES DRAWN AEROBIC AND ANAEROBIC Blood Culture adequate volume   Culture   Final    NO GROWTH 5 DAYS Performed at [facility], [address], [place], [place] [address]    Report Status 07/02/2017 FINAL  Final  Urine culture      Status: Abnormal   Collection Time: 06/27/17  3:05 PM  Result Value Ref Range Status   Specimen Description   Final    URINE, RANDOM Performed at [facility], [address], [place], [place] [address]    Special Requests   Final    NONE Performed at [facility], [address], [place], [place] [address]    Culture (A)  Final    >=100,000 COLONIES/mL ESCHERICHIA COLI >=100,000 COLONIES/mL KLEBSIELLA ORNITHINOLYTICA    Report Status 06/30/2017 FINAL  Final   Organism ID, Bacteria ESCHERICHIA COLI (A)  Final   Organism ID, Bacteria KLEBSIELLA ORNITHINOLYTICA (A)  Final      Susceptibility   Escherichia coli - MIC*    AMPICILLIN >=32 RESISTANT Resistant     CEFAZOLIN 8 SENSITIVE Sensitive     CEFTRIAXONE <=1 SENSITIVE Sensitive     CIPROFLOXACIN >=4 RESISTANT Resistant     GENTAMICIN >=16 RESISTANT Resistant     IMIPENEM <=0.25 SENSITIVE Sensitive     NITROFURANTOIN 32 SENSITIVE Sensitive     TRIMETH/SULFA <=20 SENSITIVE Sensitive     AMPICILLIN/SULBACTAM >=32 RESISTANT Resistant     PIP/TAZO <=4 SENSITIVE Sensitive     Extended ESBL NEGATIVE Sensitive     * >=100,000 COLONIES/mL ESCHERICHIA COLI   Klebsiella ornithinolytica - MIC*    AMPICILLIN >=32 RESISTANT Resistant     CEFAZOLIN <=4 SENSITIVE Sensitive     CEFTRIAXONE <=1 SENSITIVE Sensitive     CIPROFLOXACIN <=0.25 SENSITIVE Sensitive     GENTAMICIN <=1 SENSITIVE Sensitive     IMIPENEM <=0.25 SENSITIVE Sensitive     NITROFURANTOIN 32 SENSITIVE Sensitive     TRIMETH/SULFA <=20 SENSITIVE Sensitive     AMPICILLIN/SULBACTAM 4 SENSITIVE Sensitive     PIP/TAZO <=4 SENSITIVE Sensitive     * >=100,000 COLONIES/mL KLEBSIELLA ORNITHINOLYTICA  MRSA PCR Screening     Status: None   Collection Time: 06/27/17  4:54 PM  Result Value Ref Range Status   MRSA by PCR NEGATIVE NEGATIVE Final    Comment:        The GeneXpert MRSA Assay (FDA approved for NASAL specimens only), is one component of  a comprehensive MRSA colonization surveillance program. It is not intended to diagnose MRSA infection nor to guide or monitor treatment for MRSA infections. Performed at [facility], [address] [place], [place] [address]     Coagulation Studies: No results for input(s): LABPROT,  INR in the last 72 hours.  Urinalysis: No results for input(s): COLORURINE, LABSPEC, PHURINE, GLUCOSEU, HGBUR, BILIRUBINUR, KETONESUR, PROTEINUR, UROBILINOGEN, NITRITE, LEUKOCYTESUR in the last 72 hours.  Invalid input(s): APPERANCEUR    Imaging: No results found.   Medications:   . sodium chloride Stopped (06/28/17 1900)  . valproate sodium Stopped (07/04/17 0910)   . amLODipine  10 mg Oral Daily  . calcium carbonate  1 tablet Oral TID WC  . cefdinir  300 mg Oral Q24H  . Chlorhexidine Gluconate Cloth  6 each Topical Q0600  . feeding supplement (ENSURE ENLIVE)  237 mL Oral TID BM  . feeding supplement (PRO-STAT SUGAR FREE 64)  30 mL Oral TID WC  . folic acid  0.5 mg Oral Daily  . Ganciclovir  1 drop Right Eye QID  . heparin  5,000 Units Subcutaneous Q8H  . hydrALAZINE  25 mg Oral QID  . insulin aspart  0-5 Units Subcutaneous QHS  . insulin aspart  0-9 Units Subcutaneous TID WC  . levothyroxine  75 mcg Oral Daily  . metoprolol tartrate  50 mg Oral BID  . multivitamin  1 tablet Oral QHS  . pantoprazole  40 mg Oral BID AC  . polyethylene glycol  17 g Oral Daily  . pravastatin  40 mg Oral Daily  . sodium chloride flush  3 mL Intravenous Q12H  . ascorbic acid  500 mg Oral BID  . zinc sulfate  220 mg Oral Daily   sodium chloride, acetaminophen **OR** acetaminophen, albuterol, bisacodyl, guaiFENesin-dextromethorphan, HYDROcodone-acetaminophen, ipratropium-albuterol, ondansetron **OR** ondansetron (ZOFRAN) IV, polyvinyl alcohol, senna-docusate, sodium chloride flush  Assessment/ Plan:  [name] is a 84 y.o. white  female with end stage renal disease on hemodialysis,  coronary artery disease, history of bilateral knee replacement, aortic valve replacement 2009, diabetes, hypertension, hypothyroidism, peripheral vascular disease, pulmonary hypertension, anxiety was admitted on   [facility] [address]/MWF  1. End Stage Renal Disease: seen and examined on hemodialysis for extra treatment to treat uremic encephalopathy Hemodialysis treatment well tolerated   Discussed case with son    2. Anemia of chronic kidney disease: hemoglobin 11.8 - mircera as outpatient   3. Secondary Hyperparathyroidism: phosphorus and calcium at goal.  - Continue tums   4. Hypertension: elevated. Home regimen of amlodipine, hydralazine, and metoprolol.    LOS: 7 Peng Thorstenson 6/30/201911:17 AM

## 2017-07-04 NOTE — Progress Notes (Signed)
This note also relates to the following rows which could not be included: BP - Cannot attach notes to unvalidated device data  Hd completed  

## 2017-07-04 NOTE — Progress Notes (Signed)
[facility] [place] NAME: [name]    MR#:  [id number]  DATE OF BIRTH:  [birth year]  SUBJECTIVE:   More awake today.  Dialysis today  REVIEW OF SYSTEMS:   Review of Systems  Unable to perform ROS: Mental status change   DRUG ALLERGIES:   Allergies  Allergen Reactions  . Nitrofurantoin Itching  . Ace Inhibitors   . Captopril Other (See Comments)    REACTION: unspecified  . Enalapril Maleate Cough  . Ramipril Other (See Comments)    REACTION: unspecified  . Sulfa Antibiotics Other (See Comments)    Reaction unknown  . Verapamil Other (See Comments)    REACTION: unspecified    VITALS:  Blood pressure (!) 143/63, pulse 98, temperature (!) 97.5 F (36.4 C), temperature source Oral, resp. rate (!) 30, height 5\' 2"  (1.575 m), weight 44 kg (97 lb), SpO2 95 %.  PHYSICAL EXAMINATION:   Physical Exam Limited exam  GENERAL:  84 y.o.-year-old patient lying in the bed  EYES: Pupils equal, round, reactive to light and accommodation. No scleral icterus. HEENT: Head atraumatic, normocephalic. Oropharynx and nasopharynx clear. Dry oral mucosa NECK:  Supple, no jugular venous distention. No thyroid enlargement, no tenderness.  LUNGS: Bilateral coarse breath sounds CARDIOVASCULAR: S1, S2 normal. No murmurs, rubs, or gallops.  ABDOMEN: Soft, nontender, nondistended. Bowel sounds present. No organomegaly or mass.  EXTREMITIES: No cyanosis, clubbing or edema b/l.    NEUROLOGIC:unable to assess PSYCHIATRIC:  patient is awake SKIN: No obvious rash, lesion, or ulcer.   LABORATORY PANEL:  CBC Recent Labs  Lab 07/04/17 1019  WBC 5.2  HGB 11.8*  HCT 35.4  PLT 176    Chemistries  Recent Labs  Lab 06/27/17 1357  06/30/17 1654 07/04/17 1019  NA 133*   < > 136 135  K 3.9   < > 4.7 4.1  CL 93*   < > 100 99  CO2 26   < > 23 29  GLUCOSE 119*   < > 115* 117*  BUN 34*   < > 37* 18  CREATININE 3.99*   < > 4.37* 2.54*  CALCIUM 9.0    < > 8.6* 8.2*  MG  --   --  2.4  --   AST 24  --   --   --   ALT 13*  --   --   --   ALKPHOS 177*  --   --   --   BILITOT 1.2  --   --   --    < > = values in this interval not displayed.   Cardiac Enzymes Recent Labs  Lab 07/01/17 0428  TROPONINI 0.04*   RADIOLOGY:  No results found. ASSESSMENT AND PLAN:   *Acute respiratory failure with hypoxia due to pulmonary edema Most likely secondary to acute fluid overload state from end-stage renal disease/diastolic heart failure Pneumonia ruled out  Continue supplemental oxygen with weaning as tolerated  * Acute fluid overload state  Secondary to end-stage renal disease  Some improvement with hemodialysis.  Patient to get hemodialysis again today.  *Acute probable UTI Cefdinir   UC kleibseilla and e coli  *Chronic diabetes mellitus type 2  Sliding scale insulin with active checks per routine  *Chronic deconditioning, adult failure to thrive  Hospice services at discharge  *severe protein energy/calorie malnutrition Dietary input appreciated  Poor prognosis.   D/w dr Juleen China   Case discussed with Care Management/Social Worker.  CODE  STATUS: DNR  TOTAL TIME TAKING CARE OF THIS PATIENT: 25 minutes.   Note: This dictation was prepared with Dragon dictation along with smaller phrase technology. Any transcriptional errors that result from this process are unintentional.  Neita Carp M.D on 07/04/2017 at 1:08 PM  Between 7am to 6pm - Pager - [phone]  After 6pm go to [email or web address] - password [email or web address] [facility]  Office  [phone]  CC: Primary care physician; [name], MDPatient ID: [name], female   DOB: 02/14/1920, 84 y.o.   MRN: [id number]

## 2017-07-05 LAB — RENAL FUNCTION PANEL
ALBUMIN: 2.4 g/dL — AB (ref 3.5–5.0)
Anion gap: 10 (ref 5–15)
BUN: 16 mg/dL (ref 8–23)
CO2: 29 mmol/L (ref 22–32)
Calcium: 8.5 mg/dL — ABNORMAL LOW (ref 8.9–10.3)
Chloride: 95 mmol/L — ABNORMAL LOW (ref 98–111)
Creatinine, Ser: 2.59 mg/dL — ABNORMAL HIGH (ref 0.44–1.00)
GFR calc Af Amer: 17 mL/min — ABNORMAL LOW (ref >60)
GFR calc non Af Amer: 15 mL/min — ABNORMAL LOW (ref >60)
GLUCOSE: 145 mg/dL — AB (ref 70–99)
POTASSIUM: 3.8 mmol/L (ref 3.5–5.1)
Phosphorus: 2.7 mg/dL (ref 2.5–4.6)
Sodium: 134 mmol/L — ABNORMAL LOW (ref 135–145)

## 2017-07-05 LAB — GLUCOSE, CAPILLARY
GLUCOSE-CAPILLARY: 83 mg/dL (ref 70–99)
Glucose-Capillary: 70 mg/dL (ref 70–99)
Glucose-Capillary: 152 mg/dL — ABNORMAL HIGH (ref 70–99)

## 2017-07-05 LAB — CBC
HEMATOCRIT: 35.5 % (ref 35.0–47.0)
Hemoglobin: 11.7 g/dL — ABNORMAL LOW (ref 12.0–16.0)
MCH: 31.7 pg (ref 26.0–34.0)
MCHC: 32.9 g/dL (ref 32.0–36.0)
MCV: 96.3 fL (ref 80.0–100.0)
Platelets: 164 10*3/uL (ref 150–440)
RBC: 3.69 MIL/uL — ABNORMAL LOW (ref 3.80–5.20)
RDW: 18.2 % — ABNORMAL HIGH (ref 11.5–14.5)
WBC: 5.8 10*3/uL (ref 3.6–11.0)

## 2017-07-05 MED ORDER — DIVALPROEX SODIUM 500 MG PO DR TAB: 500.0000 mg | DELAYED_RELEASE_TABLET | Freq: Two times a day (BID) | ORAL | 0 refills | Status: DC

## 2017-07-05 NOTE — Discharge Summary (Signed)
[place] [facility] [place] NAME: [name]    MR#:  [id number]  DATE OF BIRTH:  [birth year]  DATE OF ADMISSION:  06/27/2017 ADMITTING PHYSICIAN: Demetrios Loll, MD  DATE OF DISCHARGE: No discharge date for patient encounter.  PRIMARY CARE PHYSICIAN: [name], MD   ADMISSION DIAGNOSIS:  Acute cystitis with hematuria [N30.01] Acute respiratory failure with hypoxia ([place]) [J96.01]  DISCHARGE DIAGNOSIS:  Active Problems:   Acute respiratory failure with hypoxia (HCC)   Goals of care, counseling/discussion   Palliative care by specialist   Failure to thrive in adult   SECONDARY DIAGNOSIS:   Past Medical History:  Diagnosis Date  . Anemia    NOS  . Anxiety   . Aortic stenosis   . Cancer [facility])    Mole on top of head to be removed in July 2013  . Chronic diastolic CHF (congestive heart failure) ([place])    a. 01/2017 Echo: EF 60-65%, no rwma, Gr2 DD, mild to mod MR, mildly dil LA/RA, nl RV fxn, Sev TR, PASP 39mmHg.  . Constipation   . Coronary artery disease    a. 2009 CABG x 3 w/ bioprosthetic AVR.  . Diabetes mellitus    type II;was on Glipizide but has been off x 48mon  . Dialysis patient ([place])   . Diarrhea   . ESRD (end stage renal disease) ([place])   . GERD (gastroesophageal reflux disease)   . History of blood transfusion   . Hyperlipidemia    takes Simvastatin nightly  . Hypertension    takes Metoprolol daily  . Hypothyroidism    takes Synthroid daily  . Migraine    hx of  . Oligouria   . Osteoarthritis   . Osteopenia   . Peripheral vascular disease ([place])   . Personal history of colonic polyps   . Pulmonary hypertension ([place])   . Urinary incontinence   . UTI (urinary tract infection)      ADMITTING HISTORY  HISTORY OF PRESENT ILLNESS:  [name]  is a 84 y.o. female with a known history of multiple medical problems including ESRD on hemodialysis, hypertension, Chronic diastolic CHF, diabetes, CAD, UTI, hyperlipidemia,  PAD, pulmonary hypertension and hypothyroidism.  The patient has had cough, sputum and the shortness of breath for a few days.  In addition, she has generalized weakness.  [facility]  She is found hypoxia and put on oxygen by nasal cannula 2 L in the ED.  Chest x-ray show pulmonary congestion and possible pneumonia.  Urinalysis showed UTI.  The patient is very hard of hearing.  Information is obtained from her son.  HOSPITAL COURSE:   *Acute respiratory failure with hypoxia due to pulmonary edema Most likely secondary to acute fluid overload state from end-stage renal disease/diastolic heart failure Pneumonia ruled out  Continuesupplemental oxygen with weaning as tolerated Improving  * Acute fluid overload state Secondary to end-stage renal disease  Resolved  *Acute probable UTI Cefdinir  UC kleibseilla and e coli Finished abx course  *Chronic diabetes mellitus type 2   *Chronic deconditioning, adult failure to thrive Hospice services at discharge  *severe protein energy/calorie malnutrition Dietary input appreciated  * Seizures Seizure activity on EEG. Started on Depakote by surgery. 500 mg BID Seen by neurology  Discharge to facility when bed available   CONSULTS OBTAINED:  Treatment Team:  Anthonette Legato, MD Schnier, Dolores Lory, MD Alexis Goodell, MD  DRUG ALLERGIES:  Allergies  Allergen Reactions  . Nitrofurantoin Itching  . Ace Inhibitors   . Captopril Other (See Comments)    REACTION: unspecified  . Enalapril Maleate Cough  . Ramipril Other (See Comments)    REACTION: unspecified  . Sulfa Antibiotics Other (See Comments)    Reaction unknown  . Verapamil Other (See Comments)    REACTION: unspecified    DISCHARGE MEDICATIONS:   Allergies as of 07/05/2017      Reactions   Nitrofurantoin Itching   Ace Inhibitors    Captopril Other (See Comments)   REACTION: unspecified   Enalapril  Maleate Cough   Ramipril Other (See Comments)   REACTION: unspecified   Sulfa Antibiotics Other (See Comments)   Reaction unknown   Verapamil Other (See Comments)   REACTION: unspecified      Medication List    TAKE these medications   acetaminophen 325 MG tablet Commonly known as:  TYLENOL Take 1 tablet (325 mg total) by mouth every 6 (six) hours as needed for mild pain or fever (or Fever >/= 101).   albuterol (2.5 MG/3ML) 0.083% nebulizer solution Commonly known as:  PROVENTIL Take 3 mLs (2.5 mg total) by nebulization every 2 (two) hours as needed for wheezing.   amLODipine 10 MG tablet Commonly known as:  NORVASC Take 1 tablet (10 mg total) by mouth daily.   ascorbic acid 500 MG tablet Commonly known as:  VITAMIN C Take 1 tablet (500 mg total) by mouth daily. What changed:  when to take this   calcium carbonate 500 MG chewable tablet Commonly known as:  TUMS - dosed in mg elemental calcium Chew 1 tablet (200 mg of elemental calcium total) by mouth 3 (three) times daily with meals.   cefdinir 300 MG capsule Commonly known as:  OMNICEF Take 1 capsule (300 mg total) by mouth every 12 (twelve) hours.   CEROVITE SENIOR Tabs Take 1 tablet by mouth daily.   divalproex 500 MG DR tablet Commonly known as:  DEPAKOTE Take 1 tablet (500 mg total) by mouth 2 (two) times daily.   epoetin alfa 10000 UNIT/ML injection Commonly known as:  EPOGEN,PROCRIT Inject 1 mL (10,000 Units total) into the vein every Monday, Wednesday, and Friday with hemodialysis.   feeding supplement (ENSURE ENLIVE) Liqd Take 237 mLs by mouth 3 (three) times daily between meals.   feeding supplement (PRO-STAT SUGAR FREE 64) Liqd Take 30 mLs by mouth 3 (three) times daily with meals.   folic acid 0.5 MG tablet Commonly known as:  FOLVITE Take 0.5 tablets (0.5 mg total) by mouth daily.   Ganciclovir 0.15 % Gel Commonly known as:  ZIRGAN Place 1 drop into the right eye 4 (four) times daily.    hydrALAZINE 25 MG tablet Commonly known as:  APRESOLINE Take 25 mg by mouth 4 (four) times daily.   HYDROcodone-acetaminophen 5-325 MG tablet Commonly known as:  NORCO/VICODIN Take 1-2 tablets by mouth every 4 (four) hours as needed for moderate pain.   insulin aspart 100 UNIT/ML injection Commonly known as:  novoLOG Inject 0-9 Units into the skin 3 (three) times daily with meals. CBG < 70: implement hypoglycemia protocol CBG 70 - 120: 0 units CBG 121 - 150: 1 unit CBG 151 - 200: 2 units CBG 201 - 250: 3 units CBG 251 - 300: 5 units CBG 301 - 350: 7 units CBG 351 - 400: 9 units CBG > 400: call MD and obtain STAT lab verification   levothyroxine 75 MCG tablet Commonly known  as:  SYNTHROID, LEVOTHROID TAKE ONE TABLET BY MOUTH ONCE DAILY   metoprolol tartrate 50 MG tablet Commonly known as:  LOPRESSOR TAKE ONE TABLET BY MOUTH TWICE DAILY What changed:    how much to take  how to take this  when to take this   multivitamin Tabs tablet Take 1 tablet by mouth at bedtime.   ondansetron 4 MG tablet Commonly known as:  ZOFRAN Take 1 tablet (4 mg total) by mouth every 6 (six) hours as needed for nausea.   pantoprazole 40 MG tablet Commonly known as:  PROTONIX Take 1 tablet (40 mg total) by mouth 2 (two) times daily before a meal.   polyethylene glycol packet Commonly known as:  MIRALAX / GLYCOLAX Take 17 g by mouth daily as needed for mild constipation. What changed:  when to take this   polyvinyl alcohol 1.4 % ophthalmic solution Commonly known as:  LIQUIFILM TEARS Place 1 drop into both eyes as needed for dry eyes.   pravastatin 40 MG tablet Commonly known as:  PRAVACHOL TAKE ONE TABLET BY MOUTH IN THE EVENING What changed:    how much to take  how to take this  when to take this   senna-docusate 8.6-50 MG tablet Commonly known as:  Senokot-S Take 1 tablet by mouth at bedtime as needed for mild constipation.   zinc sulfate 220 (50 Zn) MG capsule Take  220 mg by mouth daily.            Durable Medical Equipment  (From admission, onward)        Start     Ordered   06/30/17 1344  For home use only DME oxygen  Once    Question Answer Comment  Mode or (Route) Nasal cannula   Liters per Minute 2   Frequency Continuous (stationary and portable oxygen unit needed)   Oxygen delivery system Gas      06/30/17 1343      Today   VITAL SIGNS:  Blood pressure (!) 126/54, pulse 97, temperature (!) 97.5 F (36.4 C), temperature source Oral, resp. rate (!) 21, height 5\' 2"  (1.575 m), weight 42.5 kg (93 lb 11.1 oz), SpO2 100 %.  I/O:    Intake/Output Summary (Last 24 hours) at 07/05/2017 1313 Last data filed at 07/05/2017 1009 Gross per 24 hour  Intake 226.3 ml  Output 1500 ml  Net -1273.7 ml    PHYSICAL EXAMINATION:  Physical Exam  GENERAL:  84 y.o.-year-old patient lying in the bed with no acute distress.  Decreased hearing LUNGS: Normal breath sounds bilaterally, no wheezing, rales,rhonchi or crepitation. No use of accessory muscles of respiration.  CARDIOVASCULAR: S1, S2 normal. No murmurs, rubs, or gallops.  ABDOMEN: Soft, non-tender, non-distended. Bowel sounds present. No organomegaly or mass.  PSYCHIATRIC: The patient is alert and oriented x 3.   DATA REVIEW:   CBC Recent Labs  Lab 07/05/17 1112  WBC 5.8  HGB 11.7*  HCT 35.5  PLT 164    Chemistries  Recent Labs  Lab 06/30/17 1654  07/05/17 1112  NA 136   < > 134*  K 4.7   < > 3.8  CL 100   < > 95*  CO2 23   < > 29  GLUCOSE 115*   < > 145*  BUN 37*   < > 16  CREATININE 4.37*   < > 2.59*  CALCIUM 8.6*   < > 8.5*  MG 2.4  --   --    < > =  values in this interval not displayed.    Cardiac Enzymes Recent Labs  Lab 07/01/17 0428  TROPONINI 0.04*    Microbiology Results  Results for orders placed or performed during the hospital encounter of 06/27/17  Blood Culture (routine x 2)     Status: None   Collection Time: 06/27/17  1:59 PM  Result Value  Ref Range Status   Specimen Description BLOOD RIGHT ARM  Final   Special Requests   Final    BOTTLES DRAWN AEROBIC AND ANAEROBIC Blood Culture adequate volume   Culture   Final    NO GROWTH 5 DAYS Performed at [facility], [address] [place], [place] [address]    Report Status 07/02/2017 FINAL  Final  Blood Culture (routine x 2)     Status: None   Collection Time: 06/27/17  1:59 PM  Result Value Ref Range Status   Specimen Description BLOOD RIGHT ARM  Final   Special Requests   Final    BOTTLES DRAWN AEROBIC AND ANAEROBIC Blood Culture adequate volume   Culture   Final    NO GROWTH 5 DAYS Performed at [facility], [address] [place], [place] [address]    Report Status 07/02/2017 FINAL  Final  Urine culture     Status: Abnormal   Collection Time: 06/27/17  3:05 PM  Result Value Ref Range Status   Specimen Description   Final    URINE, RANDOM Performed at [facility], [address], [place], [place] [address]    Special Requests   Final    NONE Performed at [facility], [address], [place], [place] [address]    Culture (A)  Final    >=100,000 COLONIES/mL ESCHERICHIA COLI >=100,000 COLONIES/mL KLEBSIELLA ORNITHINOLYTICA    Report Status 06/30/2017 FINAL  Final   Organism ID, Bacteria ESCHERICHIA COLI (A)  Final   Organism ID, Bacteria KLEBSIELLA ORNITHINOLYTICA (A)  Final      Susceptibility   Escherichia coli - MIC*    AMPICILLIN >=32 RESISTANT Resistant     CEFAZOLIN 8 SENSITIVE Sensitive     CEFTRIAXONE <=1 SENSITIVE Sensitive     CIPROFLOXACIN >=4 RESISTANT Resistant     GENTAMICIN >=16 RESISTANT Resistant     IMIPENEM <=0.25 SENSITIVE Sensitive     NITROFURANTOIN 32 SENSITIVE Sensitive     TRIMETH/SULFA <=20 SENSITIVE Sensitive     AMPICILLIN/SULBACTAM >=32 RESISTANT Resistant     PIP/TAZO <=4 SENSITIVE Sensitive     Extended ESBL NEGATIVE Sensitive     * >=100,000 COLONIES/mL ESCHERICHIA COLI    Klebsiella ornithinolytica - MIC*    AMPICILLIN >=32 RESISTANT Resistant     CEFAZOLIN <=4 SENSITIVE Sensitive     CEFTRIAXONE <=1 SENSITIVE Sensitive     CIPROFLOXACIN <=0.25 SENSITIVE Sensitive     GENTAMICIN <=1 SENSITIVE Sensitive     IMIPENEM <=0.25 SENSITIVE Sensitive     NITROFURANTOIN 32 SENSITIVE Sensitive     TRIMETH/SULFA <=20 SENSITIVE Sensitive     AMPICILLIN/SULBACTAM 4 SENSITIVE Sensitive     PIP/TAZO <=4 SENSITIVE Sensitive     * >=100,000 COLONIES/mL KLEBSIELLA ORNITHINOLYTICA  MRSA PCR Screening     Status: None   Collection Time: 06/27/17  4:54 PM  Result Value Ref Range Status   MRSA by PCR NEGATIVE NEGATIVE Final    Comment:        The GeneXpert MRSA Assay (FDA approved for NASAL specimens only), is one component of a comprehensive MRSA colonization surveillance program. It  is not intended to diagnose MRSA infection nor to guide or monitor treatment for MRSA infections. Performed at [facility], [address] [place], [place] [address]     RADIOLOGY:  No results found.  Follow up with PCP in 1 week.  Management plans discussed with the patient, family and they are in agreement.  CODE STATUS:     Code Status Orders  (From admission, onward)        Start     Ordered   06/27/17 1642  Do not attempt resuscitation (DNR)  Continuous    Question Answer Comment  In the event of cardiac or respiratory ARREST Do not call a "code blue"   In the event of cardiac or respiratory ARREST Do not perform Intubation, CPR, defibrillation or ACLS   In the event of cardiac or respiratory ARREST Use medication by any route, position, wound care, and other measures to relive pain and suffering. May use oxygen, suction and manual treatment of airway obstruction as needed for comfort.      06/27/17 1641    Code Status History    Date Active Date Inactive Code Status Order ID Comments User Context   06/27/2017 1547 06/27/2017 1641 DNR [id number]  Merlyn Lot, MD ED   05/17/2017 1347 05/26/2017 2115 DNR [id number]  Bettey Costa, MD Inpatient   05/05/2017 1547 05/07/2017 2121 DNR [id number]  Hillary Bow, MD ED   04/26/2017 1709 04/28/2017 2046 DNR [id number]  Demetrios Loll, MD Inpatient   01/03/2017 1328 01/08/2017 2014 DNR [id number]  Idelle Crouch, MD Inpatient   01/03/2017 1031 01/03/2017 1328 DNR [id number]  Merlyn Lot, MD ED   09/18/2015 1817 09/22/2015 1920 DNR [id number]  Fritzi Mandes, MD Inpatient   09/18/2015 1516 09/18/2015 1817 DNR [id number]  Flora Lipps, MD ED   03/22/2013 2100 03/26/2013 1540 Full Code [id number]  Rise Patience, MD Inpatient   11/20/2012 1756 12/06/2012 2008 DNR [id number]  Annita Brod, MD Inpatient   06/18/2011 1042 06/19/2011 1401 Full Code [id number]  Paulene Floor, RN Inpatient   12/14/2010 1124 12/29/2010 1543 DNR [id number]  Bonnielee Haff, MD Inpatient   12/13/2010 0113 12/14/2010 1123 Full Code [id number]  Sid Falcon, RN Inpatient   11/22/2010 0216 11/28/2010 2239 DNR [id number]  Viviano Simas, RN Inpatient    Advance Directive Documentation     Most Recent Value  Type of Advance Directive  Healthcare Power of Attorney, Living will  Pre-existing out of facility DNR order (yellow form or pink MOST form)  -  "MOST" Form in Place?  -      TOTAL TIME TAKING CARE OF THIS PATIENT ON DAY OF DISCHARGE: more than 30 minutes.   Leia Alf Marguita Venning M.D on 07/05/2017 at 1:13 PM  Between 7am to 6pm - Pager - [phone]  After 6pm go to [email or web address] - password [email or web address] [facility]  Office  [phone]  CC: Primary care physician; [name], MD  Note: This dictation was prepared with Dragon dictation along with smaller phrase technology. Any transcriptional errors that result from this process are unintentional.

## 2017-07-05 NOTE — Progress Notes (Signed)
PT Cancellation Note  Patient Details Name: [name] MRN: [id number] DOB: 01/05/1921   Cancelled Treatment:    Reason Eval/Treat Not Completed: Patient at procedure or test/unavailable(Consult received and chart reviewed.  Patient currently off unit for dialysis.  Will re-attempt at later time/date as medically appropriate and available.)   Jocabed Cheese H. Owens Shark, PT, DPT, NCS 07/05/17, 11:07 AM [phone]

## 2017-07-05 NOTE — Discharge Instructions (Signed)
Renal diet ° °Activity as tolerated °

## 2017-07-05 NOTE — Progress Notes (Signed)
[facility]  ROUNDING NOTE   Subjective:   Seen and examined on hemodialysis. Tolerating treatment well.  Tolerating well   HEMODIALYSIS FLOWSHEET:  Blood Flow Rate (mL/min): 400 mL/min Arterial Pressure (mmHg): -220 mmHg Venous Pressure (mmHg): 220 mmHg Transmembrane Pressure (mmHg): 50 mmHg Ultrafiltration Rate (mL/min): 660 mL/min Dialysate Flow Rate (mL/min): 600 ml/min Conductivity: Machine : 13.6 Conductivity: Machine : 13.6 Dialysis Fluid Bolus: Normal Saline Bolus Amount (mL): 250 mL    Objective:  Vital signs in last 24 hours:  Temp:  [97.5 F (36.4 C)-98.4 F (36.9 C)] 97.5 F (36.4 C) (07/01 1005) Pulse Rate:  [86-98] 97 (07/01 1300) Resp:  [14-35] 21 (07/01 1300) BP: (126-177)/(51-71) 126/54 (07/01 1300) SpO2:  [95 %-100 %] 100 % (07/01 1300) Weight:  [39.4 kg (86 lb 13.8 oz)-42.5 kg (93 lb 11.1 oz)] 42.5 kg (93 lb 11.1 oz) (07/01 1005)  Weight change:  Filed Weights   06/28/17 1409 07/04/17 1330 07/05/17 1005  Weight: 44 kg (97 lb) 39.4 kg (86 lb 13.8 oz) 42.5 kg (93 lb 11.1 oz)    Intake/Output: I/O last 3 completed shifts: In: 351.1 [P.O.:120; IV Piggyback:231.1] Out: 1500 [Other:1500]   Intake/Output this shift:  Total I/O In: 120 [P.O.:120] Out: -   Physical Exam: General: NAD,   Head: Normocephalic, atraumatic. Moist oral mucosal membranes  Eyes: Anicteric,    Neck: Supple, trachea midline  Lungs:  Clear to auscultation  Heart: Regular rate and rhythm, murmur present  Abdomen:  Soft, nontender,   Extremities:  No peripheral edema.  Neurologic: Nonfocal, alert and oriented  Skin: No lesions  Access: Left AVF     Basic Metabolic Panel: Recent Labs  Lab 06/28/17 1345 06/30/17 1654 07/04/17 1019 07/05/17 1112  NA 132* 136 135 134*  K 4.2 4.7 4.1 3.8  CL 95* 100 99 95*  CO2 23 23 29 29   GLUCOSE 116* 115* 117* 145*  BUN 41* 37* 18 16  CREATININE 4.79* 4.37* 2.54* 2.59*  CALCIUM 8.2* 8.6* 8.2* 8.5*  MG  --  2.4   --   --   PHOS 5.1* 4.7* 2.0* 2.7    Liver Function Tests: Recent Labs  Lab 06/28/17 1345 06/30/17 1654 07/04/17 1019 07/05/17 1112  ALBUMIN 2.7* 2.7* 2.3* 2.4*   No results for input(s): LIPASE, AMYLASE in the last 168 hours. Recent Labs  Lab 06/30/17 1654  AMMONIA 22    CBC: Recent Labs  Lab 06/28/17 1345 06/30/17 1654 07/04/17 1019 07/05/17 1112  WBC 7.3 5.9 5.2 5.8  NEUTROABS 6.0  --   --   --   HGB 10.7* 11.5* 11.8* 11.7*  HCT 32.5* 34.6* 35.4 35.5  MCV 96.4 96.6 95.3 96.3  PLT 154 193 176 164    Cardiac Enzymes: Recent Labs  Lab 06/30/17 1654 06/30/17 2156 07/01/17 0428  TROPONINI 0.03* 0.03* 0.04*    BNP: Invalid input(s): POCBNP  CBG: Recent Labs  Lab 07/03/17 2132 07/04/17 0735 07/04/17 1643 07/04/17 2059 07/05/17 0742  GLUCAP 133* 90 137* 94 7    Microbiology: Results for orders placed or performed during the hospital encounter of 06/27/17  Blood Culture (routine x 2)     Status: None   Collection Time: 06/27/17  1:59 PM  Result Value Ref Range Status   Specimen Description BLOOD RIGHT ARM  Final   Special Requests   Final    BOTTLES DRAWN AEROBIC AND ANAEROBIC Blood Culture adequate volume   Culture   Final    NO GROWTH  5 DAYS Performed at [facility], [address] [place], [place] [address]    Report Status 07/02/2017 FINAL  Final  Blood Culture (routine x 2)     Status: None   Collection Time: 06/27/17  1:59 PM  Result Value Ref Range Status   Specimen Description BLOOD RIGHT ARM  Final   Special Requests   Final    BOTTLES DRAWN AEROBIC AND ANAEROBIC Blood Culture adequate volume   Culture   Final    NO GROWTH 5 DAYS Performed at [facility], [address] [place], [place] [address]    Report Status 07/02/2017 FINAL  Final  Urine culture     Status: Abnormal   Collection Time: 06/27/17  3:05 PM  Result Value Ref Range Status   Specimen Description   Final    URINE, RANDOM Performed at  [facility], [address], [place], [place] [address]    Special Requests   Final    NONE Performed at [facility], [address] [place], [place] [address]    Culture (A)  Final    >=100,000 COLONIES/mL ESCHERICHIA COLI >=100,000 COLONIES/mL KLEBSIELLA ORNITHINOLYTICA    Report Status 06/30/2017 FINAL  Final   Organism ID, Bacteria ESCHERICHIA COLI (A)  Final   Organism ID, Bacteria KLEBSIELLA ORNITHINOLYTICA (A)  Final      Susceptibility   Escherichia coli - MIC*    AMPICILLIN >=32 RESISTANT Resistant     CEFAZOLIN 8 SENSITIVE Sensitive     CEFTRIAXONE <=1 SENSITIVE Sensitive     CIPROFLOXACIN >=4 RESISTANT Resistant     GENTAMICIN >=16 RESISTANT Resistant     IMIPENEM <=0.25 SENSITIVE Sensitive     NITROFURANTOIN 32 SENSITIVE Sensitive     TRIMETH/SULFA <=20 SENSITIVE Sensitive     AMPICILLIN/SULBACTAM >=32 RESISTANT Resistant     PIP/TAZO <=4 SENSITIVE Sensitive     Extended ESBL NEGATIVE Sensitive     * >=100,000 COLONIES/mL ESCHERICHIA COLI   Klebsiella ornithinolytica - MIC*    AMPICILLIN >=32 RESISTANT Resistant     CEFAZOLIN <=4 SENSITIVE Sensitive     CEFTRIAXONE <=1 SENSITIVE Sensitive     CIPROFLOXACIN <=0.25 SENSITIVE Sensitive     GENTAMICIN <=1 SENSITIVE Sensitive     IMIPENEM <=0.25 SENSITIVE Sensitive     NITROFURANTOIN 32 SENSITIVE Sensitive     TRIMETH/SULFA <=20 SENSITIVE Sensitive     AMPICILLIN/SULBACTAM 4 SENSITIVE Sensitive     PIP/TAZO <=4 SENSITIVE Sensitive     * >=100,000 COLONIES/mL KLEBSIELLA ORNITHINOLYTICA  MRSA PCR Screening     Status: None   Collection Time: 06/27/17  4:54 PM  Result Value Ref Range Status   MRSA by PCR NEGATIVE NEGATIVE Final    Comment:        The GeneXpert MRSA Assay (FDA approved for NASAL specimens only), is one component of a comprehensive MRSA colonization surveillance program. It is not intended to diagnose MRSA infection nor to guide or monitor treatment for MRSA  infections. Performed at [facility], [address] [place], [place] [address]     Coagulation Studies: No results for input(s): LABPROT, INR in the last 72 hours.  Urinalysis: No results for input(s): COLORURINE, LABSPEC, PHURINE, GLUCOSEU, HGBUR, BILIRUBINUR, KETONESUR, PROTEINUR, UROBILINOGEN, NITRITE, LEUKOCYTESUR in the last 72 hours.  Invalid input(s): APPERANCEUR    Imaging: No results found.   Medications:   . sodium chloride Stopped (06/28/17 1900)  . valproate sodium Stopped (07/05/17 0957)   . amLODipine  10 mg  Oral Daily  . calcium carbonate  1 tablet Oral TID WC  . cefdinir  300 mg Oral Q24H  . feeding supplement (ENSURE ENLIVE)  237 mL Oral TID BM  . feeding supplement (PRO-STAT SUGAR FREE 64)  30 mL Oral TID WC  . folic acid  0.5 mg Oral Daily  . Ganciclovir  1 drop Right Eye QID  . heparin  5,000 Units Subcutaneous Q8H  . hydrALAZINE  25 mg Oral QID  . insulin aspart  0-5 Units Subcutaneous QHS  . insulin aspart  0-9 Units Subcutaneous TID WC  . levothyroxine  75 mcg Oral Daily  . metoprolol tartrate  50 mg Oral BID  . multivitamin  1 tablet Oral QHS  . pantoprazole  40 mg Oral BID AC  . polyethylene glycol  17 g Oral Daily  . pravastatin  40 mg Oral Daily  . sodium chloride flush  3 mL Intravenous Q12H  . ascorbic acid  500 mg Oral BID   sodium chloride, acetaminophen **OR** acetaminophen, albuterol, bisacodyl, guaiFENesin-dextromethorphan, HYDROcodone-acetaminophen, ipratropium-albuterol, ondansetron **OR** ondansetron (ZOFRAN) IV, polyvinyl alcohol, senna-docusate, sodium chloride flush  Assessment/ Plan:  [name] is a 84 y.o. white  female with end stage renal disease on hemodialysis, coronary artery disease, history of bilateral knee replacement, aortic valve replacement 2009, diabetes, hypertension, hypothyroidism, peripheral vascular disease, pulmonary hypertension, anxiety was admitted on   [address]/MWF  1. End Stage Renal Disease: seen and examined on hemodialysis for extra treatment to treat uremic encephalopathy Hemodialysis treatment well tolerated    2. Anemia of chronic kidney disease: hemoglobin 11.7 - mircera as outpatient   3. Secondary Hyperparathyroidism: phosphorus and calcium at goal.  - calcium acetate  4. Hypertension: elevated. Home regimen of amlodipine, hydralazine, and metoprolol.    LOS: 8 Farzana Koci 7/1/20191:20 PM

## 2017-07-05 NOTE — Progress Notes (Signed)
Post HD assessment. Pt tolerated tx well without c/o or complication. Net UF 1511, goal met.    07/05/17 1334  Vital Signs  Temp 97.7 F (36.5 C)  Temp Source Oral  Pulse Rate 99  Pulse Rate Source Monitor  Resp (!) 27  BP (!) 165/60  BP Location Right Arm  BP Method Automatic  Patient Position (if appropriate) Lying  Oxygen Therapy  SpO2 100 %  O2 Device Nasal Cannula  O2 Flow Rate (L/min) 2 L/min  Dialysis Weight  Weight 41.1 kg (90 lb 9.7 oz)  Type of Weight Post-Dialysis  Post-Hemodialysis Assessment  Rinseback Volume (mL) 250 mL  KECN 66.8 V  Dialyzer Clearance Lightly streaked  Duration of HD Treatment -hour(s) 3 hour(s)  Hemodialysis Intake (mL) 500 mL  UF Total -Machine (mL) 2011 mL  Net UF (mL) 1511 mL  Tolerated HD Treatment Yes  AVG/AVF Arterial Site Held (minutes) 10 minutes  AVG/AVF Venous Site Held (minutes) 10 minutes  Education / Care Plan  Dialysis Education Provided Yes  Documented Education in Care Plan Yes  Fistula / Graft Left Upper arm Arteriovenous fistula  No Placement Date or Time found.   Orientation: Left  Access Location: Upper arm  Access Type: Arteriovenous fistula  Site Condition No complications  Fistula / Graft Assessment Present;Thrill;Bruit  Status Deaccessed  Drainage Description None

## 2017-07-05 NOTE — Progress Notes (Signed)
Post HD assessment    07/05/17 1334  Neurological  Level of Consciousness Alert  Orientation Level Oriented X4  Respiratory  Respiratory Pattern Tachypnea;Dyspnea at rest  Chest Assessment Chest expansion symmetrical  Cardiac  ECG Monitor Yes  Vascular  R Radial Pulse +2  L Radial Pulse +2  Integumentary  Integumentary (WDL) X  Skin Color Appropriate for ethnicity  Musculoskeletal  Musculoskeletal (WDL) X  Generalized Weakness Yes  Assistive Device None  GU Assessment  Genitourinary (WDL) X  Genitourinary Symptoms  (HD)  Psychosocial  Psychosocial (WDL) WDL

## 2017-07-05 NOTE — Progress Notes (Signed)
Pre HD assessment    07/05/17 1005  Vital Signs  Temp (!) 97.5 F (36.4 C)  Temp Source Oral  Pulse Rate 96  Pulse Rate Source Monitor  Resp (!) 31  BP (!) 159/71  BP Location Right Arm  BP Method Automatic  Patient Position (if appropriate) Lying  Oxygen Therapy  SpO2 98 %  O2 Device Nasal Cannula  O2 Flow Rate (L/min) 2 L/min  Pain Assessment  Pain Scale 0-10  Pain Score 0  Dialysis Weight  Weight 42.5 kg (93 lb 11.1 oz)  Type of Weight Pre-Dialysis  Time-Out for Hemodialysis  What Procedure? HD  Pt Identifiers(min of two) First/Last Name;MRN/Account#  Correct Site? Yes  Correct Side? Yes  Correct Procedure? Yes  Consents Verified? Yes  Rad Studies Available? [name]  Safety Precautions Reviewed? Yes  Engineer, civil (consulting) Number  ([id number])  Station Number 4  UF/Alarm Test Passed  Conductivity: Meter 13.8  Conductivity: Machine  13.9  pH 7.4  Reverse Osmosis main  Normal Saline Lot Number [id number]  Dialyzer Lot Number [id number]  Disposable Set Lot Number [id number]  Machine Temperature 98.6 F (37 C)  Musician and Audible Yes  Blood Lines Intact and Secured Yes  Pre Treatment Patient Checks  Vascular access used during treatment Fistula  Hepatitis B Surface Antigen Results Negative  Date Hepatitis B Surface Antigen Drawn 01/03/17  Hepatitis B Surface Antibody  (>10)  Date Hepatitis B Surface Antibody Drawn 01/03/17  Hemodialysis Consent Verified Yes  Hemodialysis Standing Orders Initiated Yes  ECG (Telemetry) Monitor On Yes  Prime Ordered Normal Saline  Length of  DialysisTreatment -hour(s) 3 Hour(s)  Dialyzer Elisio 17H NR  Dialysate 3K, 2.5 Ca  Dialysis Anticoagulant None  Dialysate Flow Ordered 600  Blood Flow Rate Ordered 400 mL/min  Ultrafiltration Goal 1.5 Liters  Pre Treatment Labs Renal panel;CBC  Dialysis Blood Pressure Support Ordered Normal Saline  Education / Care Plan  Dialysis Education Provided Yes  Documented Education in Care Plan  Yes  Fistula / Graft Left Upper arm Arteriovenous fistula  No Placement Date or Time found.   Orientation: Left  Access Location: Upper arm  Access Type: Arteriovenous fistula  Site Condition No complications  Fistula / Graft Assessment Present;Thrill;Bruit  Drainage Description None

## 2017-07-05 NOTE — Progress Notes (Signed)
[place] at [place] NAME: Tenecia Ignasiak    MR#:  [id number]  DATE OF BIRTH:  26-Aug-1920  SUBJECTIVE:   More awake today.   Seen in dialysis unit  REVIEW OF SYSTEMS:   Review of Systems  Unable to perform ROS: Mental status change   DRUG ALLERGIES:   Allergies  Allergen Reactions  . Nitrofurantoin Itching  . Ace Inhibitors   . Captopril Other (See Comments)    REACTION: unspecified  . Enalapril Maleate Cough  . Ramipril Other (See Comments)    REACTION: unspecified  . Sulfa Antibiotics Other (See Comments)    Reaction unknown  . Verapamil Other (See Comments)    REACTION: unspecified    VITALS:  Blood pressure (!) 165/60, pulse 99, temperature 97.7 F (36.5 C), temperature source Oral, resp. rate (!) 27, height 5\' 2"  (1.575 m), weight 41.1 kg (90 lb 9.7 oz), SpO2 100 %.  PHYSICAL EXAMINATION:   Physical Exam Limited exam  GENERAL:  84 y.o.-year-old patient lying in the bed  EYES: Pupils equal, round, reactive to light and accommodation. No scleral icterus. HEENT: Head atraumatic, normocephalic. Oropharynx and nasopharynx clear. Dry oral mucosa NECK:  Supple, no jugular venous distention. No thyroid enlargement, no tenderness.  LUNGS: Bilateral coarse breath sounds CARDIOVASCULAR: S1, S2 normal. No murmurs, rubs, or gallops.  ABDOMEN: Soft, nontender, nondistended. Bowel sounds present. No organomegaly or mass.  EXTREMITIES: No cyanosis, clubbing or edema b/l.    NEUROLOGIC:unable to assess PSYCHIATRIC:  patient is awake SKIN: No obvious rash, lesion, or ulcer.   LABORATORY PANEL:  CBC Recent Labs  Lab 07/05/17 1112  WBC 5.8  HGB 11.7*  HCT 35.5  PLT 164    Chemistries  Recent Labs  Lab 06/30/17 1654  07/05/17 1112  NA 136   < > 134*  K 4.7   < > 3.8  CL 100   < > 95*  CO2 23   < > 29  GLUCOSE 115*   < > 145*  BUN 37*   < > 16  CREATININE 4.37*   < > 2.59*  CALCIUM 8.6*   < > 8.5*  MG 2.4  --    --    < > = values in this interval not displayed.   Cardiac Enzymes Recent Labs  Lab 07/01/17 0428  TROPONINI 0.04*   RADIOLOGY:  No results found. ASSESSMENT AND PLAN:   *Acute respiratory failure with hypoxia due to pulmonary edema Most likely secondary to acute fluid overload state from end-stage renal disease/diastolic heart failure Pneumonia ruled out  Continue supplemental oxygen with weaning as tolerated  * Acute fluid overload state  Secondary to end-stage renal disease  improvement with hemodialysis.   *Acute probable UTI Cefdinir   *Chronic diabetes mellitus type 2  Sliding scale insulin with active checks per routine  *Chronic deconditioning, adult failure to thrive   *severe protein energy/calorie malnutrition Dietary input appreciated  Case discussed with Care Management/Social Worker.  CODE STATUS: DNR  TOTAL TIME TAKING CARE OF THIS PATIENT: 25 minutes.   Note: This dictation was prepared with Dragon dictation along with smaller phrase technology. Any transcriptional errors that result from this process are unintentional.  Neita Carp M.D on 07/05/2017 at 1:44 PM  Between 7am to 6pm - Pager - [phone]  After 6pm go to [email or web address] - password [email or web address] [facility]  Office  [phone]  CC: Primary care physician; [name], MDPatient  ID: Creola Corn, female   DOB: [birth year], 84 y.o.   MRN: [id number]

## 2017-07-05 NOTE — Progress Notes (Addendum)
Nutrition Follow Up Note   DOCUMENTATION CODES:   Severe malnutrition in context of chronic illness  INTERVENTION:   Recommend discontinue zinc as long term zinc supplementation can decrease copper levels   Refeed risk; recommend monitor K, Mg, and P labs   Ensure Enlive po TID, each supplement provides 350 kcal and 20 grams of protein  Magic cup TID with meals, each supplement provides 290 kcal and 9 grams of protein  Rena-vite daily  Vitamin C 500mg  po BID  Dysphagia 3 diet   NUTRITION DIAGNOSIS:   Severe Malnutrition related to chronic illness(ESRD on HD) as evidenced by moderate fat depletion, severe muscle depletion.  GOAL:   Patient will meet greater than or equal to 90% of their needs  -progressing   MONITOR:   PO intake, Supplement acceptance, Labs, Weight trends, Skin, I & O's  ASSESSMENT:   84 y/o female with ESRD on HD, DM, admitted with cute respiratory failure with hypoxia due to pneumonia and pulmonary edema Pt with L AVF  Pt with increased appetite and oral intake; eating 25% of meals and drinking some Ensure. Pt is also having supplements sent up on her meal trays. Per chart, pt has lost 12lbs since admit; unsure how much wt loss r/t fluid changes vs loss of muscle. Recommend continue supplements and vitamins. Pt on vitamin C as scurvy is suspected. Pt on chronic long term zinc supplementation; recommend discontinue to prevent copper depletions. Pt would likely need NGT placement and nutrition support to meet her estimated needs; this may not be in line with pt's goals of care given her advanced age. Pt is at high refeeding risk; recommend monitor K, Mg, and P labs. Pt refusing most of the Prostat supplements; recommend discontinue these.   Medications reviewed and include: tums, omnicef, folic acid, heparin, insulin, synthroid, rena-vite, vitamin C, zinc, protonix, miralax  Labs reviewed: K 4.1 wnl, creat 2.54(H), Ca 8.2(L) adj. 9.56 wnl, P 2.0(L), alb  2.3(L)  Diet Order:   Diet Order           DIET DYS 3 Room service appropriate? No; Fluid consistency: Thin; Fluid restriction: 1200 mL Fluid  Diet effective now        Diet - low sodium heart healthy         EDUCATION NEEDS:   No education needs have been identified at this time  Skin:  Skin Assessment: Reviewed RN Assessment(ecchymosis )  Last BM:  7/1- type 6  Height:   Ht Readings from Last 1 Encounters:  06/27/17 5\' 2"  (1.575 m)    Weight:   Wt Readings from Last 1 Encounters:  07/04/17 86 lb 13.8 oz (39.4 kg)    Ideal Body Weight:  50 kg  BMI:  Body mass index is 15.89 kg/m.  Estimated Nutritional Needs:   Kcal:  1200-1400kcal/day   Protein:  70-79g/day   Fluid:  per MD   Koleen Distance MS, RD, LDN Pager #- [phone] Office#- [phone] After Hours Pager: [phone]

## 2017-07-05 NOTE — Care Management Important Message (Signed)
Copy of signed IM left in patient's room.    

## 2017-07-05 NOTE — Progress Notes (Signed)
Pre HD assessment    07/05/17 1006  Neurological  Level of Consciousness Alert  Orientation Level Oriented X4  Respiratory  Respiratory Pattern Tachypnea;Dyspnea at rest  Chest Assessment Chest expansion symmetrical  Cardiac  ECG Monitor Yes  Vascular  R Radial Pulse +2  L Radial Pulse +2  Integumentary  Integumentary (WDL) X  Skin Color Appropriate for ethnicity  Musculoskeletal  Musculoskeletal (WDL) X  Generalized Weakness Yes  Assistive Device None  GU Assessment  Genitourinary (WDL) X  Genitourinary Symptoms  (HD)  Psychosocial  Psychosocial (WDL) WDL

## 2017-07-05 NOTE — Progress Notes (Signed)
HD tx end   07/05/17 1322  Vital Signs  Pulse Rate 100  Pulse Rate Source Monitor  Resp (!) 26  BP (!) 160/61  BP Location Right Arm  BP Method Automatic  Patient Position (if appropriate) Lying  Oxygen Therapy  SpO2 100 %  O2 Device Nasal Cannula  O2 Flow Rate (L/min) 2 L/min  During Hemodialysis Assessment  Dialysis Fluid Bolus Normal Saline  Bolus Amount (mL) 250 mL  Intra-Hemodialysis Comments Tx completed

## 2017-07-05 NOTE — Progress Notes (Signed)
HD tx start    07/05/17 1015  Vital Signs  Pulse Rate 95  Pulse Rate Source Monitor  Resp (!) 35  BP (!) 156/54  BP Location Right Arm  BP Method Automatic  Patient Position (if appropriate) Lying  Oxygen Therapy  SpO2 99 %  O2 Device Nasal Cannula  O2 Flow Rate (L/min) 2 L/min  During Hemodialysis Assessment  Blood Flow Rate (mL/min) 400 mL/min  Arterial Pressure (mmHg) -200 mmHg  Venous Pressure (mmHg) 200 mmHg  Transmembrane Pressure (mmHg) 60 mmHg  Ultrafiltration Rate (mL/min) 670 mL/min  Dialysate Flow Rate (mL/min) 600 ml/min  Conductivity: Machine  13.9  HD Safety Checks Performed Yes  Dialysis Fluid Bolus Normal Saline  Bolus Amount (mL) 250 mL  Intra-Hemodialysis Comments Tx initiated  Fistula / Graft Left Upper arm Arteriovenous fistula  No Placement Date or Time found.   Orientation: Left  Access Location: Upper arm  Access Type: Arteriovenous fistula  Status Accessed  Needle Size 15

## 2017-07-06 LAB — GLUCOSE, CAPILLARY
GLUCOSE-CAPILLARY: 132 mg/dL — AB (ref 70–99)
Glucose-Capillary: 101 mg/dL — ABNORMAL HIGH (ref 70–99)
Glucose-Capillary: 76 mg/dL (ref 70–99)
Glucose-Capillary: 128 mg/dL — ABNORMAL HIGH (ref 70–99)

## 2017-07-06 NOTE — NC FL2 (Signed)
[place] LEVEL OF CARE SCREENING TOOL     IDENTIFICATION  Patient Name: [name] Birthdate: [birth year] Sex: female Admission Date (Current Location): 06/27/2017  [facility] and [place] Number:  Engineering geologist and Address:  [facility], [address] [place], [place] [address]      Provider Number: [id number]  Attending Physician Name and Address:  Hillary Bow, MD  Relative Name and Phone Number:       Current Level of Care: Hospital Recommended Level of Care: [place] Prior Approval Number:    Date Approved/Denied:   PASRR Number:    Discharge Plan: SNF    Current Diagnoses: Patient Active Problem List   Diagnosis Date Noted  . Failure to thrive in adult   . Goals of care, counseling/discussion   . Palliative care by specialist   . Acute respiratory failure with hypoxia ([place]) 06/27/2017  . Pressure injury of skin 05/19/2017  . GIB (gastrointestinal bleeding) 05/17/2017  . GI bleed 05/05/2017  . Pelvic fracture ([place]) 04/26/2017  . Protein-calorie malnutrition, severe 01/07/2017  . CHF (congestive heart failure) ([place]) 01/03/2017  . UTI (urinary tract infection) 01/03/2017  . Acute metabolic encephalopathy 92/42/6834  . Cerebrovascular accident (CVA) due to occlusion of right middle cerebral artery ([place]) 10/24/2015  . Malnutrition of mild degree ([place]) 10/24/2015  . Altered mental status 09/18/2015  . Preventative health care 05/03/2014  . Peripheral vascular disease ([place]) 12/21/2013  . Toe amputation status ([place]) 08/29/2013  . History of recent fall 04/18/2013  . HTN (hypertension), malignant 03/22/2013  . S/P aortic valve replacement with bioprosthetic valve 11/15/2012  . S/P CABG x 3 11/15/2012  . Anemia of chronic kidney failure 12/22/2010  . Thrombocytopenia ([place]) 12/13/2010  . End stage renal disease on dialysis ([place]) 12/13/2010  . S/P aortic valve replacement 08/11/2010  . Arterial  insufficiency-lower 07/25/2010  . NEURODERMATITIS 04/08/2009  . Carotid arterial disease ([place]) 01/01/2009  . Coronary atherosclerosis 02/11/2007  . AORTIC STENOSIS 10/04/2006  . ANXIETY 05/30/2006  . Pulmonary hypertension ([place]) 05/30/2006  . Hypothyroidism 05/27/2006  . Type 2 diabetes mellitus with renal manifestations, controlled ([place]) 05/27/2006  . Hyperlipidemia 05/27/2006  . Essential hypertension 05/27/2006  . GERD 05/27/2006  . OSTEOARTHRITIS 05/27/2006  . OSTEOPENIA 05/27/2006  . URINARY INCONTINENCE 05/27/2006    Orientation RESPIRATION BLADDER Height & Weight     Self, Place  O2(2 liters ) Incontinent Weight: 90 lb 9.7 oz (41.1 kg) Height:  5\' 2"  (157.5 cm)  BEHAVIORAL SYMPTOMS/MOOD NEUROLOGICAL BOWEL NUTRITION STATUS  (None) (None) Incontinent Diet(Dysphagia 3 )  AMBULATORY STATUS COMMUNICATION OF NEEDS Skin   Extensive Assist Verbally Normal                       Personal Care Assistance Level of Assistance  Bathing, Feeding, Dressing, Total care Bathing Assistance: Maximum assistance Feeding assistance: Maximum assistance Dressing Assistance: Maximum assistance Total Care Assistance: Maximum assistance   Functional Limitations Info  Sight, Hearing, Speech Sight Info: Adequate Hearing Info: Impaired Speech Info: Adequate    SPECIAL CARE FACTORS FREQUENCY  PT (By licensed PT)                    Contractures Contractures Info: Not present    Additional Factors Info  Code Status, Allergies Code Status Info: DNR Allergies Info: Enalapril Maleate, Nitrofurantoin, Verapamil, Sulfa Antibiotics, Ramipril, Captopril, Ace Inhibitors           Current Medications (  07/06/2017):  This is the current hospital active medication list Current Facility-Administered Medications  Medication Dose Route Frequency Provider Last Rate Last Dose  . 0.9 %  sodium chloride infusion  250 mL Intravenous PRN Demetrios Loll, MD   Stopped at 06/28/17 1900  .  acetaminophen (TYLENOL) tablet 650 mg  650 mg Oral Q6H PRN Demetrios Loll, MD       Or  . acetaminophen (TYLENOL) suppository 650 mg  650 mg Rectal Q6H PRN Demetrios Loll, MD      . albuterol (PROVENTIL) (2.5 MG/3ML) 0.083% nebulizer solution 2.5 mg  2.5 mg Nebulization Q2H PRN Demetrios Loll, MD   2.5 mg at 06/30/17 1917  . amLODipine (NORVASC) tablet 10 mg  10 mg Oral Daily Demetrios Loll, MD   10 mg at 07/06/17 1233  . bisacodyl (DULCOLAX) EC tablet 5 mg  5 mg Oral Daily PRN Demetrios Loll, MD   5 mg at 06/27/17 1751  . calcium carbonate (TUMS - dosed in mg elemental calcium) chewable tablet 200 mg of elemental calcium  1 tablet Oral TID WC Demetrios Loll, MD   200 mg of elemental calcium at 07/06/17 1232  . feeding supplement (ENSURE ENLIVE) (ENSURE ENLIVE) liquid 237 mL  237 mL Oral TID BM Salary, Montell D, MD   237 mL at 07/05/17 2312  . feeding supplement (PRO-STAT SUGAR FREE 64) liquid 30 mL  30 mL Oral TID WC Demetrios Loll, MD   30 mL at 07/06/17 1232  . folic acid (FOLVITE) tablet 0.5 mg  0.5 mg Oral Daily Demetrios Loll, MD   0.5 mg at 07/06/17 1234  . Ganciclovir (ZIRGAN) 0.15 % ophthalmic gel 1 drop  1 drop Right Eye QID Demetrios Loll, MD   1 drop at 07/05/17 2313  . guaiFENesin-dextromethorphan (ROBITUSSIN DM) 100-10 MG/5ML syrup 5 mL  5 mL Oral Q4H PRN Demetrios Loll, MD   5 mL at 07/05/17 1806  . heparin injection 5,000 Units  5,000 Units Subcutaneous Q8H Demetrios Loll, MD   5,000 Units at 07/02/17 0540  . hydrALAZINE (APRESOLINE) tablet 25 mg  25 mg Oral QID Demetrios Loll, MD   25 mg at 07/06/17 1234  . HYDROcodone-acetaminophen (NORCO/VICODIN) 5-325 MG per tablet 1-2 tablet  1-2 tablet Oral Q4H PRN Demetrios Loll, MD      . insulin aspart (novoLOG) injection 0-5 Units  0-5 Units Subcutaneous QHS Demetrios Loll, MD      . insulin aspart (novoLOG) injection 0-9 Units  0-9 Units Subcutaneous TID WC Demetrios Loll, MD   2 Units at 07/05/17 1748  . ipratropium-albuterol (DUONEB) 0.5-2.5 (3) MG/3ML nebulizer solution 3 mL  3 mL Nebulization  Q6H PRN Salary, Montell D, MD      . levothyroxine (SYNTHROID, LEVOTHROID) tablet 75 mcg  75 mcg Oral Daily Demetrios Loll, MD   75 mcg at 07/06/17 1233  . metoprolol tartrate (LOPRESSOR) tablet 50 mg  50 mg Oral BID Demetrios Loll, MD   50 mg at 07/06/17 1235  . multivitamin (RENA-VIT) tablet 1 tablet  1 tablet Oral QHS Salary, Holly Bodily D, MD   1 tablet at 07/05/17 2315  . ondansetron (ZOFRAN) tablet 4 mg  4 mg Oral Q6H PRN Demetrios Loll, MD       Or  . ondansetron [facility]) injection 4 mg  4 mg Intravenous Q6H PRN Demetrios Loll, MD   4 mg at 06/27/17 1836  . pantoprazole (PROTONIX) EC tablet 40 mg  40 mg Oral BID AC Demetrios Loll, MD   40  mg at 07/05/17 1748  . polyethylene glycol (MIRALAX / GLYCOLAX) packet 17 g  17 g Oral Daily Demetrios Loll, MD   17 g at 07/05/17 0900  . polyvinyl alcohol (LIQUIFILM TEARS) 1.4 % ophthalmic solution 1 drop  1 drop Both Eyes PRN Demetrios Loll, MD   1 drop at 07/04/17 1437  . pravastatin (PRAVACHOL) tablet 40 mg  40 mg Oral Daily Demetrios Loll, MD   40 mg at 07/06/17 1234  . senna-docusate (Senokot-S) tablet 1 tablet  1 tablet Oral QHS PRN Demetrios Loll, MD      . sodium chloride flush (NS) 0.9 % injection 3 mL  3 mL Intravenous Q12H Demetrios Loll, MD   3 mL at 07/06/17 1236  . sodium chloride flush (NS) 0.9 % injection 3 mL  3 mL Intravenous PRN Demetrios Loll, MD      . valproate (DEPACON) 625 mg in dextrose 5 % 50 mL IVPB  625 mg Intravenous Q12H Alexis Goodell, MD 56.3 mL/hr at 07/06/17 0553 625 mg at 07/06/17 0553  . vitamin C (ASCORBIC ACID) tablet 500 mg  500 mg Oral BID Demetrios Loll, MD   500 mg at 07/05/17 2313     Discharge Medications: Please see discharge summary for a list of discharge medications.  Relevant Imaging Results:  Relevant Lab Results:   Additional Information ss: [id number]  Shela Leff, LCSW

## 2017-07-06 NOTE — Progress Notes (Signed)
PT Cancellation Note  Patient Details Name: [name] MRN: [id number] DOB: Sep 29, 1920   Cancelled Treatment:     Order received chart reviewed. PT attempted session this morning. Pt required multiple verbal and tactile cues to open eyes. Pt refused therapy at this time. PT will attempt eval on next date pt medically appropriate.  Yolonda Kida, SPT    [name] 07/06/2017, 10:59 AM

## 2017-07-06 NOTE — Clinical Social Work Note (Signed)
Awaiting patient to be able to work with PT. In patient's current condition, it is suspected she will need short term rehab. CSW has prepared a bedsearch in the event this is needed. Shela Leff MSW,LCSW [phone]

## 2017-07-06 NOTE — Progress Notes (Signed)
[place] at [place] NAME: Cola Gane    MR#:  [id number]  DATE OF BIRTH:  01/12/1920  SUBJECTIVE:   Patient is drowsy today.  Poor oral intake.  REVIEW OF SYSTEMS:   Review of Systems  Unable to perform ROS: Mental status change   DRUG ALLERGIES:   Allergies  Allergen Reactions  . Nitrofurantoin Itching  . Ace Inhibitors   . Captopril Other (See Comments)    REACTION: unspecified  . Enalapril Maleate Cough  . Ramipril Other (See Comments)    REACTION: unspecified  . Sulfa Antibiotics Other (See Comments)    Reaction unknown  . Verapamil Other (See Comments)    REACTION: unspecified    VITALS:  Blood pressure (!) 174/57, pulse 93, temperature 97.8 F (36.6 C), temperature source Oral, resp. rate (!) 22, height 5\' 2"  (1.575 m), weight 41.1 kg (90 lb 9.7 oz), SpO2 100 %.  PHYSICAL EXAMINATION:   Physical Exam Limited exam  GENERAL:  84 y.o.-year-old patient lying in the bed  EYES: Pupils equal, round, reactive to light and accommodation. No scleral icterus. HEENT: Head atraumatic, normocephalic. Oropharynx and nasopharynx clear. Dry oral mucosa NECK:  Supple, no jugular venous distention. No thyroid enlargement, no tenderness.  LUNGS: Bilateral coarse breath sounds CARDIOVASCULAR: S1, S2 normal. No murmurs, rubs, or gallops.  ABDOMEN: Soft, nontender, nondistended. Bowel sounds present. No organomegaly or mass.  EXTREMITIES: No cyanosis, clubbing or edema b/l.    NEUROLOGIC:unable to assess PSYCHIATRIC:  patient is drowy SKIN: No obvious rash, lesion, or ulcer.   LABORATORY PANEL:  CBC Recent Labs  Lab 07/05/17 1112  WBC 5.8  HGB 11.7*  HCT 35.5  PLT 164    Chemistries  Recent Labs  Lab 06/30/17 1654  07/05/17 1112  NA 136   < > 134*  K 4.7   < > 3.8  CL 100   < > 95*  CO2 23   < > 29  GLUCOSE 115*   < > 145*  BUN 37*   < > 16  CREATININE 4.37*   < > 2.59*  CALCIUM 8.6*   < > 8.5*  MG 2.4  --    --    < > = values in this interval not displayed.   Cardiac Enzymes Recent Labs  Lab 07/01/17 0428  TROPONINI 0.04*   RADIOLOGY:  No results found. ASSESSMENT AND PLAN:   *Acute respiratory failure with hypoxia due to pulmonary edema Resolved with HD treatments  *Acute probable UTI Cefdinir . Finished course  *Chronic diabetes mellitus type 2  Sliding scale insulin with active checks per routine  *Chronic deconditioning, adult failure to thrive   *severe protein energy/calorie malnutrition Dietary input appreciated  Some worsening mental status today. Will reassess patient in AM. HD in chair tomorrow If able.  Case discussed with Care Management/Social Worker.  CODE STATUS: DNR  TOTAL TIME TAKING CARE OF THIS PATIENT: 25 minutes.   Leia Alf Jesusita Jocelyn M.D on 07/06/2017 at 3:38 PM  Between 7am to 6pm - Pager - [phone]  After 6pm go to [email or web address] - password [email or web address] [place] [facility]  Office  [phone]  CC: Primary care physician; [name], MDPatient ID: Creola Corn, female   DOB: [birth year], 84 y.o.   MRN: [id number]

## 2017-07-06 NOTE — Progress Notes (Signed)
[facility]  ROUNDING NOTE   Subjective:   Patient is resting quietly.  No acute events reported.  Mouth breather.  Has nasal cannula oxygen 1500 cc of fluid was removed with hemodialysis yesterday   Objective:  Vital signs in last 24 hours:  Temp:  [97.3 F (36.3 C)-97.8 F (36.6 C)] 97.8 F (36.6 C) (07/02 1132) Pulse Rate:  [83-100] 93 (07/02 1132) Resp:  [17-27] 22 (07/02 1132) BP: (126-174)/(50-61) 174/57 (07/02 1132) SpO2:  [97 %-100 %] 100 % (07/02 1132) Weight:  [41.1 kg (90 lb 9.7 oz)] 41.1 kg (90 lb 9.7 oz) (07/01 1334)  Weight change: 3.1 kg (6 lb 13.4 oz) Filed Weights   07/04/17 1330 07/05/17 1005 07/05/17 1334  Weight: 39.4 kg (86 lb 13.8 oz) 42.5 kg (93 lb 11.1 oz) 41.1 kg (90 lb 9.7 oz)    Intake/Output: I/O last 3 completed shifts: In: 120 [P.O.:120] Out: 0814 [Other:1511]   Intake/Output this shift:  No intake/output data recorded.  Physical Exam: General: NAD, resting comfortably  Head: Normocephalic, atraumatic. Moist oral mucosal membranes  Eyes: Anicteric,    Neck: Supple, trachea midline  Lungs:  Clear to auscultation  Heart: Regular rate and rhythm, murmur present  Abdomen:  Soft, nontender,   Extremities:  No peripheral edema.  Neurologic:  Resting quietly at present  Skin: No lesions  Access: Left AVF     Basic Metabolic Panel: Recent Labs  Lab 06/30/17 1654 07/04/17 1019 07/05/17 1112  NA 136 135 134*  K 4.7 4.1 3.8  CL 100 99 95*  CO2 23 29 29   GLUCOSE 115* 117* 145*  BUN 37* 18 16  CREATININE 4.37* 2.54* 2.59*  CALCIUM 8.6* 8.2* 8.5*  MG 2.4  --   --   PHOS 4.7* 2.0* 2.7    Liver Function Tests: Recent Labs  Lab 06/30/17 1654 07/04/17 1019 07/05/17 1112  ALBUMIN 2.7* 2.3* 2.4*   No results for input(s): LIPASE, AMYLASE in the last 168 hours. Recent Labs  Lab 06/30/17 1654  AMMONIA 22    CBC: Recent Labs  Lab 06/30/17 1654 07/04/17 1019 07/05/17 1112  WBC 5.9 5.2 5.8  HGB 11.5* 11.8*  11.7*  HCT 34.6* 35.4 35.5  MCV 96.6 95.3 96.3  PLT 193 176 164    Cardiac Enzymes: Recent Labs  Lab 06/30/17 1654 06/30/17 2156 07/01/17 0428  TROPONINI 0.03* 0.03* 0.04*    BNP: Invalid input(s): POCBNP  CBG: Recent Labs  Lab 07/04/17 2059 07/05/17 0742 07/05/17 1650 07/05/17 2114 07/06/17 0745  GLUCAP 94 70 152* 46 101*    Microbiology: Results for orders placed or performed during the hospital encounter of 06/27/17  Blood Culture (routine x 2)     Status: None   Collection Time: 06/27/17  1:59 PM  Result Value Ref Range Status   Specimen Description BLOOD RIGHT ARM  Final   Special Requests   Final    BOTTLES DRAWN AEROBIC AND ANAEROBIC Blood Culture adequate volume   Culture   Final    NO GROWTH 5 DAYS Performed at [facility], [address] [place], [place] [address]    Report Status 07/02/2017 FINAL  Final  Blood Culture (routine x 2)     Status: None   Collection Time: 06/27/17  1:59 PM  Result Value Ref Range Status   Specimen Description BLOOD RIGHT ARM  Final   Special Requests   Final    BOTTLES DRAWN AEROBIC AND ANAEROBIC Blood Culture adequate volume  Culture   Final    NO GROWTH 5 DAYS Performed at [facility], [address] [place], [place] [address]    Report Status 07/02/2017 FINAL  Final  Urine culture     Status: Abnormal   Collection Time: 06/27/17  3:05 PM  Result Value Ref Range Status   Specimen Description   Final    URINE, RANDOM Performed at [facility], [address] [place], [place] [address]    Special Requests   Final    NONE Performed at [facility], [address] [place], [place] [address]    Culture (A)  Final    >=100,000 COLONIES/mL ESCHERICHIA COLI >=100,000 COLONIES/mL KLEBSIELLA ORNITHINOLYTICA    Report Status 06/30/2017 FINAL  Final   Organism ID, Bacteria ESCHERICHIA COLI (A)  Final   Organism ID, Bacteria KLEBSIELLA ORNITHINOLYTICA (A)  Final       Susceptibility   Escherichia coli - MIC*    AMPICILLIN >=32 RESISTANT Resistant     CEFAZOLIN 8 SENSITIVE Sensitive     CEFTRIAXONE <=1 SENSITIVE Sensitive     CIPROFLOXACIN >=4 RESISTANT Resistant     GENTAMICIN >=16 RESISTANT Resistant     IMIPENEM <=0.25 SENSITIVE Sensitive     NITROFURANTOIN 32 SENSITIVE Sensitive     TRIMETH/SULFA <=20 SENSITIVE Sensitive     AMPICILLIN/SULBACTAM >=32 RESISTANT Resistant     PIP/TAZO <=4 SENSITIVE Sensitive     Extended ESBL NEGATIVE Sensitive     * >=100,000 COLONIES/mL ESCHERICHIA COLI   Klebsiella ornithinolytica - MIC*    AMPICILLIN >=32 RESISTANT Resistant     CEFAZOLIN <=4 SENSITIVE Sensitive     CEFTRIAXONE <=1 SENSITIVE Sensitive     CIPROFLOXACIN <=0.25 SENSITIVE Sensitive     GENTAMICIN <=1 SENSITIVE Sensitive     IMIPENEM <=0.25 SENSITIVE Sensitive     NITROFURANTOIN 32 SENSITIVE Sensitive     TRIMETH/SULFA <=20 SENSITIVE Sensitive     AMPICILLIN/SULBACTAM 4 SENSITIVE Sensitive     PIP/TAZO <=4 SENSITIVE Sensitive     * >=100,000 COLONIES/mL KLEBSIELLA ORNITHINOLYTICA  MRSA PCR Screening     Status: None   Collection Time: 06/27/17  4:54 PM  Result Value Ref Range Status   MRSA by PCR NEGATIVE NEGATIVE Final    Comment:        The GeneXpert MRSA Assay (FDA approved for NASAL specimens only), is one component of a comprehensive MRSA colonization surveillance program. It is not intended to diagnose MRSA infection nor to guide or monitor treatment for MRSA infections. Performed at [facility], [address] [place], [place] [address]     Coagulation Studies: No results for input(s): LABPROT, INR in the last 72 hours.  Urinalysis: No results for input(s): COLORURINE, LABSPEC, PHURINE, GLUCOSEU, HGBUR, BILIRUBINUR, KETONESUR, PROTEINUR, UROBILINOGEN, NITRITE, LEUKOCYTESUR in the last 72 hours.  Invalid input(s): APPERANCEUR    Imaging: No results found.   Medications:   . sodium chloride Stopped  (06/28/17 1900)  . valproate sodium 625 mg (07/06/17 0553)   . amLODipine  10 mg Oral Daily  . calcium carbonate  1 tablet Oral TID WC  . feeding supplement (ENSURE ENLIVE)  237 mL Oral TID BM  . feeding supplement (PRO-STAT SUGAR FREE 64)  30 mL Oral TID WC  . folic acid  0.5 mg Oral Daily  . Ganciclovir  1 drop Right Eye QID  . heparin  5,000 Units Subcutaneous Q8H  . hydrALAZINE  25 mg Oral QID  . insulin aspart  0-5  Units Subcutaneous QHS  . insulin aspart  0-9 Units Subcutaneous TID WC  . levothyroxine  75 mcg Oral Daily  . metoprolol tartrate  50 mg Oral BID  . multivitamin  1 tablet Oral QHS  . pantoprazole  40 mg Oral BID AC  . polyethylene glycol  17 g Oral Daily  . pravastatin  40 mg Oral Daily  . sodium chloride flush  3 mL Intravenous Q12H  . ascorbic acid  500 mg Oral BID   sodium chloride, acetaminophen **OR** acetaminophen, albuterol, bisacodyl, guaiFENesin-dextromethorphan, HYDROcodone-acetaminophen, ipratropium-albuterol, ondansetron **OR** ondansetron (ZOFRAN) IV, polyvinyl alcohol, senna-docusate, sodium chloride flush  Assessment/ Plan:  Ms. CHARLESTON VIERLING is a 84 y.o. white  female with end stage renal disease on hemodialysis, coronary artery disease, history of bilateral knee replacement, aortic valve replacement 2009, diabetes, hypertension, hypothyroidism, peripheral vascular disease, pulmonary hypertension, anxiety was admitted on   [facility] [address]/MWF  1. End Stage Renal Disease:  Patient has been tolerating her hemodialysis treatments Next treatment tomorrow.  Trial of treatment in the chair in anticipation for discharge  2. Anemia of chronic kidney disease: hemoglobin 11.7 - mircera as outpatient   3. Secondary Hyperparathyroidism:  - monitor phos  Currently 2.7    LOS: 9 Lowry Bala 7/2/201911:45 AM

## 2017-07-06 NOTE — Progress Notes (Signed)
PT Cancellation Note  Patient Details Name: [name] MRN: [id number] DOB: 1920-02-09   Cancelled Treatment:     PT attempted eval again this afternoon upon request from nursing. Pt sleeping upon arrival and requires verbal and tactile cuing to wake. Pt unable to remain awake long enough to partake in eval. PT will attempt at next date she is medically appropriate.  Yolonda Kida, SPT    Mahmoud Blazejewski 07/06/2017, 1:39 PM

## 2017-07-07 DIAGNOSIS — Z992 Dependence on renal dialysis: Secondary | ICD-10-CM

## 2017-07-07 DIAGNOSIS — Z7189 Other specified counseling: Secondary | ICD-10-CM

## 2017-07-07 DIAGNOSIS — N186 End stage renal disease: Secondary | ICD-10-CM

## 2017-07-07 DIAGNOSIS — J9601 Acute respiratory failure with hypoxia: Secondary | ICD-10-CM

## 2017-07-07 DIAGNOSIS — R627 Adult failure to thrive: Secondary | ICD-10-CM

## 2017-07-07 DIAGNOSIS — N3001 Acute cystitis with hematuria: Secondary | ICD-10-CM

## 2017-07-07 DIAGNOSIS — Z515 Encounter for palliative care: Secondary | ICD-10-CM

## 2017-07-07 LAB — GLUCOSE, CAPILLARY
GLUCOSE-CAPILLARY: 99 mg/dL (ref 70–99)
Glucose-Capillary: 79 mg/dL (ref 70–99)
Glucose-Capillary: 125 mg/dL — ABNORMAL HIGH (ref 70–99)
Glucose-Capillary: 120 mg/dL — ABNORMAL HIGH (ref 70–99)

## 2017-07-07 LAB — VITAMIN B1

## 2017-07-07 MED ORDER — LIDOCAINE-PRILOCAINE 2.5-2.5 % EX CREA
TOPICAL_CREAM | Freq: Every day | CUTANEOUS | Status: DC | PRN
Start: 2017-07-07 — End: 2017-07-08
  Filled 2017-07-07: qty 5

## 2017-07-07 MED ORDER — CHLORHEXIDINE GLUCONATE CLOTH 2 % EX PADS: 6.0000 | MEDICATED_PAD | Freq: Every day | CUTANEOUS | Status: DC

## 2017-07-07 MED ORDER — HEPARIN SODIUM (PORCINE) 1000 UNIT/ML DIALYSIS: 20.0000 [IU]/kg | INTRAMUSCULAR | Status: DC | PRN

## 2017-07-07 MED ORDER — EPOETIN ALFA 10000 UNIT/ML IJ SOLN
4000.0000 [IU] | INTRAMUSCULAR | Status: DC
  Administered 2017-07-07: 4000 [IU] via INTRAVENOUS
  Filled 2017-07-07 (×2): qty 0.4

## 2017-07-07 NOTE — Clinical Social Work Note (Signed)
CSW spoke with patient's son this afternoon regarding discharge planning. Patient has been perplexing in that one minute she will be sleeping and barely responding, not able to follow commands and the next she will be alert and able to walk 5 steps and follow commands. Patient's son expressed that patient is very hard of hearing as well. CSW discussed with patient's son that [facility] would more than likely need patient to be able to ambulate better than she is currently. CSW provided bed offers and [facility]'s [facility] chose [facility]. [facility] is aware and will be obtaining auth.  Patient is able to sit up for the duration of dialysis. Shela Leff MSW,LCSW [phone]

## 2017-07-07 NOTE — Evaluation (Signed)
Physical Therapy Evaluation Patient Details Name: [name] MRN: [id number] DOB: 01-Apr-1920 Today's Date: 07/07/2017   History of Present Illness  pt presented to ED on 06/27/17 for SOB and weakness. Pt subsequently diagnosed with acute respiratory failure. Pt has a past medical history that includes anemia, anxiety, aortic stenosis, CHF, CAD, DM, GERD, and HTN.    Clinical Impression  Pt is a pleasant 84 year old female who was admitted for acute respiratory failure. Pt performs bed mobility, transfers, and ambulation with mod to max assist. Pt demonstrates deficits with strength, mobility, and endurance. Pt transferred to chair beside bed with max assist upon request from MD. Pt demonstrates gross deconditioning noted through inability to perform motions against gravity. Pts HOH and lethargy makes achieving detailed history difficult. Pt requires continued cuing throughout session to open eyes and falls asleep several times during therapy. Pt max assist to transfer showing minimal ability to assist. Pt quickly falls asleep once in chair. Pt fatigues extremely quickly during session and is drastically limited due to fatigue and lethargy. At this time skilled therapy is not appropriate due to pts drastic fatigue. PT recommends d/c to long term nursing care facility due to high level of assistance needed. PT plans to d/c pt in house. If pt demonstrates improvements and could benefit from future PT evaluation please place another order.     Follow Up Recommendations Other (comment)(long term nursing care)    Equipment Recommendations  None recommended by PT    Recommendations for Other Services       Precautions / Restrictions Precautions Precautions: None Precaution Comments: up with assistance, increase activity slowly Restrictions Weight Bearing Restrictions: No      Mobility  Bed Mobility Overal bed mobility: Needs Assistance Bed Mobility: Supine to Sit     Supine to sit: Mod  assist;Max assist     General bed mobility comments: pt mod to max assist supine to sit and mod assist to maintain sitting EOB.  Transfers Overall transfer level: Needs assistance Equipment used: Rolling walker (2 wheeled) Transfers: Sit to/from Stand Sit to Stand: Max assist         General transfer comment: pt max assist sit<>stand at RW. Pt able to initiate movement and assist through UE placement. Pt max assist to maintain upright posture in standing.  Ambulation/Gait Ambulation/Gait assistance: Max assist Gait Distance (Feet): 3 Feet Assistive device: Rolling walker (2 wheeled) Gait Pattern/deviations: Shuffle     General Gait Details: pt max assist to amb 3' to chair beside bed using RW. pt requires physical assistance to shift weight as well as advance LEs and RW. Pt fatigues very easily and falls asleep quickly once in chair.  Stairs            Wheelchair Mobility    Modified Rankin (Stroke Patients Only)       Balance Overall balance assessment: Needs assistance   Sitting balance-Leahy Scale: Poor Sitting balance - Comments: mod to max assist to remain upright with B feet supported on floor.      Standing balance-Leahy Scale: Poor Standing balance comment: max assist to maintain upright posture with B UE support on RW                             Pertinent Vitals/Pain Pain Assessment: No/denies pain    Home Living Family/patient expects to be discharged to:: Assisted living  Home Equipment: Walker - 4 wheels Additional Comments: pt presents to hospital from [place]. unable to achieve full history due to pt lethargy    Prior Function Level of Independence: Needs assistance         Comments: Pt unable to provide information regarding PLOF and no family present.  Based on pt's current mobility level, assume that pt required assist with most ADLs and mobility.      Hand Dominance        Extremity/Trunk  Assessment   Upper Extremity Assessment Upper Extremity Assessment: Generalized weakness    Lower Extremity Assessment Lower Extremity Assessment: Generalized weakness(Unable to hip flex against gravity B; 3/5 knee flex)       Communication   Communication: HOH  Cognition Arousal/Alertness: Lethargic Behavior During Therapy: WFL for tasks assessed/performed Overall Cognitive Status: Difficult to assess                                 General Comments: difficult to assess cognition due to pt HOH and lethergy      General Comments      Exercises     Assessment/Plan    PT Assessment Patent does not need any further PT services  PT Problem List         PT Treatment Interventions      PT Goals (Current goals can be found in the Care Plan section)  Acute Rehab PT Goals Patient Stated Goal: unable to state due to lethargy PT Goal Formulation: With patient Time For Goal Achievement: 07/21/17 Potential to Achieve Goals: Poor    Frequency     Barriers to discharge        Co-evaluation               AM-PAC PT "6 Clicks" Daily Activity  Outcome Measure Difficulty turning over in bed (including adjusting bedclothes, sheets and blankets)?: Unable Difficulty moving from lying on back to sitting on the side of the bed? : Unable Difficulty sitting down on and standing up from a chair with arms (e.g., wheelchair, bedside commode, etc,.)?: Unable Help needed moving to and from a bed to chair (including a wheelchair)?: Total Help needed walking in hospital room?: Total Help needed climbing 3-5 steps with a railing? : Total 6 Click Score: 6    End of Session Equipment Utilized During Treatment: Gait belt Activity Tolerance: Patient limited by lethargy Patient left: in chair;with call bell/phone within reach;with chair alarm set Nurse Communication: Mobility status PT Visit Diagnosis: Unsteadiness on feet (R26.81);Other abnormalities of gait and mobility  (R26.89);Repeated falls (R29.6);Muscle weakness (generalized) (M62.81);History of falling (Z91.81);Difficulty in walking, not elsewhere classified (R26.2);Apraxia (R48.2);Other symptoms and signs involving the nervous system (R29.898)    Time: 3329-5188 PT Time Calculation (min) (ACUTE ONLY): 25 min   Charges:         PT G Codes:        Keyly Baldonado, SPT   Hari Casaus 07/07/2017, 1:08 PM

## 2017-07-07 NOTE — Progress Notes (Signed)
[facility]  ROUNDING NOTE   Subjective:   Patient is alert and clinically looks good Able to eat. No N/V reported  Objective:  Vital signs in last 24 hours:  Temp:  [97.8 F (36.6 C)-98.2 F (36.8 C)] 98 F (36.7 C) (07/03 0536) Pulse Rate:  [84-93] 84 (07/03 0536) Resp:  [22-24] 24 (07/03 0536) BP: (155-174)/(55-57) 155/55 (07/03 0536) SpO2:  [96 %-100 %] 96 % (07/03 0536)  Weight change:  Filed Weights   07/04/17 1330 07/05/17 1005 07/05/17 1334  Weight: 39.4 kg (86 lb 13.8 oz) 42.5 kg (93 lb 11.1 oz) 41.1 kg (90 lb 9.7 oz)    Intake/Output: I/O last 3 completed shifts: In: 206.3 [P.O.:120; I.V.:10; IV Piggyback:76.3] Out: -    Intake/Output this shift:  No intake/output data recorded.  Physical Exam: General: NAD, resting comfortably  Head: Normocephalic, atraumatic. Moist oral mucosal membranes  Eyes: Anicteric,    Neck: Supple, trachea midline  Lungs:  Clear to auscultation  Heart: Regular rate and rhythm, murmur present  Abdomen:  Soft, nontender,   Extremities:  No peripheral edema.  Neurologic:  Alert and oriented  Skin: No lesions  Access: Left AVF     Basic Metabolic Panel: Recent Labs  Lab 06/30/17 1654 07/04/17 1019 07/05/17 1112  NA 136 135 134*  K 4.7 4.1 3.8  CL 100 99 95*  CO2 23 29 29   GLUCOSE 115* 117* 145*  BUN 37* 18 16  CREATININE 4.37* 2.54* 2.59*  CALCIUM 8.6* 8.2* 8.5*  MG 2.4  --   --   PHOS 4.7* 2.0* 2.7    Liver Function Tests: Recent Labs  Lab 06/30/17 1654 07/04/17 1019 07/05/17 1112  ALBUMIN 2.7* 2.3* 2.4*   No results for input(s): LIPASE, AMYLASE in the last 168 hours. Recent Labs  Lab 06/30/17 1654  AMMONIA 22    CBC: Recent Labs  Lab 06/30/17 1654 07/04/17 1019 07/05/17 1112  WBC 5.9 5.2 5.8  HGB 11.5* 11.8* 11.7*  HCT 34.6* 35.4 35.5  MCV 96.6 95.3 96.3  PLT 193 176 164    Cardiac Enzymes: Recent Labs  Lab 06/30/17 1654 06/30/17 2156 07/01/17 0428  TROPONINI 0.03* 0.03*  0.04*    BNP: Invalid input(s): POCBNP  CBG: Recent Labs  Lab 07/06/17 0745 07/06/17 1130 07/06/17 1639 07/06/17 2143 07/07/17 0736  GLUCAP 101* 76 128* 132* 43    Microbiology: Results for orders placed or performed during the hospital encounter of 06/27/17  Blood Culture (routine x 2)     Status: None   Collection Time: 06/27/17  1:59 PM  Result Value Ref Range Status   Specimen Description BLOOD RIGHT ARM  Final   Special Requests   Final    BOTTLES DRAWN AEROBIC AND ANAEROBIC Blood Culture adequate volume   Culture   Final    NO GROWTH 5 DAYS Performed at [facility], [address], [place], [place] [address]    Report Status 07/02/2017 FINAL  Final  Blood Culture (routine x 2)     Status: None   Collection Time: 06/27/17  1:59 PM  Result Value Ref Range Status   Specimen Description BLOOD RIGHT ARM  Final   Special Requests   Final    BOTTLES DRAWN AEROBIC AND ANAEROBIC Blood Culture adequate volume   Culture   Final    NO GROWTH 5 DAYS Performed at [facility], [address], [employer], [place] [address]    Report Status 07/02/2017 FINAL  Final  Urine culture     Status: Abnormal   Collection Time: 06/27/17  3:05 PM  Result Value Ref Range Status   Specimen Description   Final    URINE, RANDOM Performed at [facility], [address] [place], [place] [address]    Special Requests   Final    NONE Performed at [facility], [address] [place], [place] [address]    Culture (A)  Final    >=100,000 COLONIES/mL ESCHERICHIA COLI >=100,000 COLONIES/mL KLEBSIELLA ORNITHINOLYTICA    Report Status 06/30/2017 FINAL  Final   Organism ID, Bacteria ESCHERICHIA COLI (A)  Final   Organism ID, Bacteria KLEBSIELLA ORNITHINOLYTICA (A)  Final      Susceptibility   Escherichia coli - MIC*    AMPICILLIN >=32 RESISTANT Resistant     CEFAZOLIN 8 SENSITIVE Sensitive     CEFTRIAXONE <=1 SENSITIVE Sensitive     CIPROFLOXACIN  >=4 RESISTANT Resistant     GENTAMICIN >=16 RESISTANT Resistant     IMIPENEM <=0.25 SENSITIVE Sensitive     NITROFURANTOIN 32 SENSITIVE Sensitive     TRIMETH/SULFA <=20 SENSITIVE Sensitive     AMPICILLIN/SULBACTAM >=32 RESISTANT Resistant     PIP/TAZO <=4 SENSITIVE Sensitive     Extended ESBL NEGATIVE Sensitive     * >=100,000 COLONIES/mL ESCHERICHIA COLI   Klebsiella ornithinolytica - MIC*    AMPICILLIN >=32 RESISTANT Resistant     CEFAZOLIN <=4 SENSITIVE Sensitive     CEFTRIAXONE <=1 SENSITIVE Sensitive     CIPROFLOXACIN <=0.25 SENSITIVE Sensitive     GENTAMICIN <=1 SENSITIVE Sensitive     IMIPENEM <=0.25 SENSITIVE Sensitive     NITROFURANTOIN 32 SENSITIVE Sensitive     TRIMETH/SULFA <=20 SENSITIVE Sensitive     AMPICILLIN/SULBACTAM 4 SENSITIVE Sensitive     PIP/TAZO <=4 SENSITIVE Sensitive     * >=100,000 COLONIES/mL KLEBSIELLA ORNITHINOLYTICA  MRSA PCR Screening     Status: None   Collection Time: 06/27/17  4:54 PM  Result Value Ref Range Status   MRSA by PCR NEGATIVE NEGATIVE Final    Comment:        The GeneXpert MRSA Assay (FDA approved for NASAL specimens only), is one component of a comprehensive MRSA colonization surveillance program. It is not intended to diagnose MRSA infection nor to guide or monitor treatment for MRSA infections. Performed at [facility], [address] [place], [place] [address]     Coagulation Studies: No results for input(s): LABPROT, INR in the last 72 hours.  Urinalysis: No results for input(s): COLORURINE, LABSPEC, PHURINE, GLUCOSEU, HGBUR, BILIRUBINUR, KETONESUR, PROTEINUR, UROBILINOGEN, NITRITE, LEUKOCYTESUR in the last 72 hours.  Invalid input(s): APPERANCEUR    Imaging: No results found.   Medications:   . sodium chloride Stopped (06/28/17 1900)  . valproate sodium Stopped (07/07/17 0609)   . amLODipine  10 mg Oral Daily  . calcium carbonate  1 tablet Oral TID WC  . Chlorhexidine Gluconate Cloth  6 each  Topical Q0600  . epoetin (EPOGEN/PROCRIT) injection  4,000 Units Intravenous Q M,W,F-HD  . feeding supplement (ENSURE ENLIVE)  237 mL Oral TID BM  . feeding supplement (PRO-STAT SUGAR FREE 64)  30 mL Oral TID WC  . folic acid  0.5 mg Oral Daily  . Ganciclovir  1 drop Right Eye QID  . heparin  5,000 Units Subcutaneous Q8H  . hydrALAZINE  25 mg Oral QID  . insulin aspart  0-5 Units Subcutaneous QHS  . insulin aspart  0-9 Units Subcutaneous TID WC  .  levothyroxine  75 mcg Oral Daily  . metoprolol tartrate  50 mg Oral BID  . multivitamin  1 tablet Oral QHS  . pantoprazole  40 mg Oral BID AC  . polyethylene glycol  17 g Oral Daily  . pravastatin  40 mg Oral Daily  . sodium chloride flush  3 mL Intravenous Q12H  . ascorbic acid  500 mg Oral BID   sodium chloride, acetaminophen **OR** acetaminophen, albuterol, bisacodyl, guaiFENesin-dextromethorphan, HYDROcodone-acetaminophen, ipratropium-albuterol, lidocaine-prilocaine, ondansetron **OR** ondansetron (ZOFRAN) IV, polyvinyl alcohol, senna-docusate, sodium chloride flush  Assessment/ Plan:  [name] is a 84 y.o. white  female with end stage renal disease on hemodialysis, coronary artery disease, history of bilateral knee replacement, aortic valve replacement 2009, diabetes, hypertension, hypothyroidism, peripheral vascular disease, pulmonary hypertension, anxiety was admitted on   [facility] [address]/MWF  1. End Stage Renal Disease:  Patient has been tolerating her hemodialysis treatments Next treatment today seated in the chair in anticipation for discharge  2. Anemia of chronic kidney disease: hemoglobin 11.7 - mircera as outpatient   3. Secondary Hyperparathyroidism:  - monitor phos  Currently 2.7  4. Acute resp failure with hypoxia due to acute pulm edema - improved  5. UTI - completed Abx    LOS: [place] 7/3/20199:44 AM

## 2017-07-07 NOTE — Progress Notes (Signed)
HD tx end    07/07/17 1620  Vital Signs  Pulse Rate 96  Pulse Rate Source Monitor  Resp (!) 34  BP (!) 121/54  BP Location Right Arm  BP Method Automatic  Patient Position (if appropriate) Lying  Oxygen Therapy  SpO2 100 %  O2 Device Room Air  During Hemodialysis Assessment  Dialysis Fluid Bolus Normal Saline  Bolus Amount (mL) 250 mL  Intra-Hemodialysis Comments Tx completed

## 2017-07-07 NOTE — Progress Notes (Signed)
2nd attempt, pharmacy was called regarding pt medication , to be given during HD tx.

## 2017-07-07 NOTE — Progress Notes (Signed)
HD tx start    07/07/17 1315  Vital Signs  Pulse Rate 88  Pulse Rate Source Monitor  Resp (!) 34  BP (!) 173/57  BP Location Right Arm  BP Method Automatic  Patient Position (if appropriate) Lying  Oxygen Therapy  SpO2 95 %  O2 Device Room Air  During Hemodialysis Assessment  Blood Flow Rate (mL/min) 400 mL/min  Arterial Pressure (mmHg) -170 mmHg  Venous Pressure (mmHg) 190 mmHg  Transmembrane Pressure (mmHg) 60 mmHg  Ultrafiltration Rate (mL/min) 670 mL/min  Dialysate Flow Rate (mL/min) 800 ml/min  Conductivity: Machine  14  HD Safety Checks Performed Yes  Dialysis Fluid Bolus Normal Saline  Bolus Amount (mL) 250 mL  Intra-Hemodialysis Comments Tx initiated  Fistula / Graft Left Upper arm Arteriovenous fistula  No Placement Date or Time found.   Orientation: Left  Access Location: Upper arm  Access Type: Arteriovenous fistula  Status Accessed  Needle Size 15

## 2017-07-07 NOTE — Progress Notes (Signed)
Ist attempt, message sent to pharmacy regarding pt medication, to be given during HD tx.    07/07/17 1230  Hand-Off documentation  Report received from (Full Name) Stark Bray

## 2017-07-07 NOTE — Progress Notes (Signed)
Pre HD assessment    07/07/17 1308  Neurological  Level of Consciousness Alert  Orientation Level Oriented X4  Respiratory  Respiratory Pattern Dyspnea at rest;Tachypnea  Chest Assessment Chest expansion symmetrical  Bilateral Breath Sounds Diminished  Cardiac  ECG Monitor Yes  Antiarrhythmic device No  Vascular  R Radial Pulse +2  L Radial Pulse +2  Integumentary  Integumentary (WDL) X  Skin Color Pale  Musculoskeletal  Musculoskeletal (WDL) X  Generalized Weakness Yes  Assistive Device None  GU Assessment  Genitourinary (WDL) X  Genitourinary Symptoms  (HD)  Psychosocial  Psychosocial (WDL) WDL

## 2017-07-07 NOTE — Progress Notes (Signed)
Post HD assessment. Pt tolerated tx well without c/o. Increased post HD bleeding in arterial access site, MD aware. Net UF 1416, goal met.    07/07/17 1638  Vital Signs  Temp 98 F (36.7 C)  Temp Source Axillary  Pulse Rate 94  Pulse Rate Source Monitor  Resp (!) 31  BP (!) 155/52  BP Location Right Arm  BP Method Automatic  Patient Position (if appropriate) Lying  Oxygen Therapy  SpO2 97 %  O2 Device Room Air  Dialysis Weight  Weight 40.1 kg (88 lb 6.5 oz)  Type of Weight Post-Dialysis  Post-Hemodialysis Assessment  Rinseback Volume (mL) 250 mL  KECN 66.7 V  Dialyzer Clearance Lightly streaked  Duration of HD Treatment -hour(s) 3 hour(s)  Hemodialysis Intake (mL) 500 mL  UF Total -Machine (mL) 1916 mL  Net UF (mL) 1416 mL  Tolerated HD Treatment Yes  AVG/AVF Arterial Site Held (minutes) 15 minutes  AVG/AVF Venous Site Held (minutes) 15 minutes  Education / Care Plan  Dialysis Education Provided Yes  Documented Education in Care Plan Yes  Fistula / Graft Left Upper arm Arteriovenous fistula  No Placement Date or Time found.   Orientation: Left  Access Location: Upper arm  Access Type: Arteriovenous fistula  Fistula / Graft Assessment Present;Thrill;Bruit  Status Deaccessed

## 2017-07-07 NOTE — Progress Notes (Signed)
Pre HD assessment    07/07/17 1307  Vital Signs  Temp (!) 97.5 F (36.4 C)  Temp Source Oral  Pulse Rate 89  Pulse Rate Source Monitor  Resp (!) 29  BP (!) 161/49  BP Location Right Arm  BP Method Automatic  Patient Position (if appropriate) Lying  Oxygen Therapy  SpO2 95 %  O2 Device Room Air  Pain Assessment  Pain Scale 0-10  Pain Score 0  Dialysis Weight  Weight 41.1 kg (90 lb 9.7 oz)  Type of Weight Pre-Dialysis  Time-Out for Hemodialysis  What Procedure? HD  Pt Identifiers(min of two) First/Last Name;MRN/Account#  Correct Site? Yes  Correct Side? Yes  Correct Procedure? Yes  Consents Verified? Yes  Rad Studies Available? [name]  Safety Precautions Reviewed? Yes  Engineer, civil (consulting) Number  (3A)  Station Number 4  UF/Alarm Test Passed  Conductivity: Meter 13.8  Conductivity: Machine  14  pH 7.4  Reverse Osmosis main  Normal Saline Lot Number [id number]  Dialyzer Lot Number [id number]  Disposable Set Lot Number [id number]  Machine Temperature 98.6 F (37 C)  Musician and Audible Yes  Blood Lines Intact and Secured Yes  Pre Treatment Patient Checks  Vascular access used during treatment Fistula  Patient is receiving dialysis in a chair Yes  Hepatitis B Surface Antigen Results Negative  Date Hepatitis B Surface Antigen Drawn 01/03/17  Hepatitis B Surface Antibody  (>10)  Date Hepatitis B Surface Antibody Drawn 01/03/17  Hemodialysis Consent Verified Yes  Hemodialysis Standing Orders Initiated Yes  ECG (Telemetry) Monitor On Yes  Prime Ordered Normal Saline  Length of  DialysisTreatment -hour(s) 3 Hour(s)  Dialyzer Elisio 17H NR  Dialysate 3K, 2.5 Ca  Dialysis Anticoagulant None  Dialysate Flow Ordered 800  Blood Flow Rate Ordered 400 mL/min  Ultrafiltration Goal 1.5 Liters  Dialysis Blood Pressure Support Ordered Normal Saline  Education / Care Plan  Dialysis Education Provided Yes  Documented Education in Care Plan Yes  Fistula / Graft Left  Upper arm Arteriovenous fistula  No Placement Date or Time found.   Orientation: Left  Access Location: Upper arm  Access Type: Arteriovenous fistula  Site Condition No complications  Fistula / Graft Assessment Present;Thrill;Bruit  Drainage Description None

## 2017-07-07 NOTE — Progress Notes (Signed)
[facility] NAME: [name]    MR#:  [id number]  DATE OF BIRTH:  20-Mar-1920  SUBJECTIVE:   Patient is more awake but very weak. Did not follow instructions well with PT.  HD later today  REVIEW OF SYSTEMS:   Review of Systems  Unable to perform ROS: Mental status change   DRUG ALLERGIES:   Allergies  Allergen Reactions  . Nitrofurantoin Itching  . Ace Inhibitors   . Captopril Other (See Comments)    REACTION: unspecified  . Enalapril Maleate Cough  . Ramipril Other (See Comments)    REACTION: unspecified  . Sulfa Antibiotics Other (See Comments)    Reaction unknown  . Verapamil Other (See Comments)    REACTION: unspecified    VITALS:  Blood pressure (!) 173/51, pulse 83, temperature 97.8 F (36.6 C), temperature source Oral, resp. rate 20, height 5\' 2"  (1.575 m), weight 41.1 kg (90 lb 9.7 oz), SpO2 99 %.  PHYSICAL EXAMINATION:   Physical Exam Limited exam  GENERAL:  84 y.o.-year-old patient lying in the bed  EYES: Pupils equal, round, reactive to light and accommodation. No scleral icterus. HEENT: Head atraumatic, normocephalic. Oropharynx and nasopharynx clear. Dry oral mucosa NECK:  Supple, no jugular venous distention. No thyroid enlargement, no tenderness.  LUNGS: Bilateral coarse breath sounds CARDIOVASCULAR: S1, S2 normal. No murmurs, rubs, or gallops.  ABDOMEN: Soft, nontender, nondistended. Bowel sounds present. No organomegaly or mass.  EXTREMITIES: No cyanosis, clubbing or edema b/l.    NEUROLOGIC:unable to assess PSYCHIATRIC:  patient is alert and awake SKIN: No obvious rash, lesion, or ulcer.   LABORATORY PANEL:  CBC Recent Labs  Lab 07/05/17 1112  WBC 5.8  HGB 11.7*  HCT 35.5  PLT 164    Chemistries  Recent Labs  Lab 06/30/17 1654  07/05/17 1112  NA 136   < > 134*  K 4.7   < > 3.8  CL 100   < > 95*  CO2 23   < > 29  GLUCOSE 115*   < > 145*  BUN 37*   < > 16  CREATININE  4.37*   < > 2.59*  CALCIUM 8.6*   < > 8.5*  MG 2.4  --   --    < > = values in this interval not displayed.   Cardiac Enzymes Recent Labs  Lab 07/01/17 0428  TROPONINI 0.04*   RADIOLOGY:  No results found. ASSESSMENT AND PLAN:   *Acute respiratory failure with hypoxia due to pulmonary edema Resolved with HD treatments  * Acute on chronic diastolic chf Improving  *Acute probable UTI Cefdinir . Finished course  *Chronic diabetes mellitus type 2  Sliding scale insulin with active checks per routine  *Chronic deconditioning, adult failure to thrive   *severe protein energy/calorie malnutrition Dietary input appreciated  Multiple co morbidities and at 89 she will likely deteriorate quickly.  High risk for complications.  Discussed with son.  Not ready for hospice.  Would like to continue hemodialysis and skilled nursing facility for physical therapy.  Also discussed with palliative care for goals of care conversation.  Case discussed with Care Management/Social Worker.  CODE STATUS: DNR  TOTAL TIME TAKING CARE OF THIS PATIENT: 25 minutes.   Leia Alf Landy Mace M.D on 07/07/2017 at 12:11 PM  Between 7am to 6pm - Pager - [phone]  After 6pm go to [email or web address] - Proofreader  [employer]  [phone]  CC: Primary care physician; [name], MDPatient ID: Creola Corn, female   DOB: 11-13-1920, 84 y.o.   MRN: [id number]

## 2017-07-07 NOTE — Progress Notes (Signed)
Daily Progress Note   Patient Name: [name]       Date: 07/07/2017 DOB: Aug 15, 1920  Age: 84 y.o. MRN#: [id number] Attending Physician: Hillary Bow, MD Primary Care Physician: [name], MD Admit Date: 06/27/2017  Reason for Consultation/Follow-up: Establishing goals of care, Hospice Evaluation and Psychosocial/spiritual support  Subjective: Patient very drowsy this afternoon. Will open her eyes to voice but will not speak to me or follow commands. NT at bedside trying encourage patient to wake to eat lunch. No s/s of distress or discomfort.    GOC:  Follow-up with son, [name] via telephone to discuss plan of care including diagnoses, interventions, and underlying co-morbidities. Explained concern with poor long-term prognosis secondary to co-morbidities, frailty, age, and declining functional/cognitive/nutritional status. Explained high risk for recurrent hospitalizations from decline. At this point, [name] is reassured by her ability to work with physical therapy this afternoon. He is hopeful she will continue to work with therapy and be able to return to ALF. He believes she still may be getting over the UTI and why she has been sleeping more. Encouraged him to continue to think about the 'big picture' of his mother's health and continue conversations with her and providers in regards to what her wishes would be if she wasn't showing improvement. [name] is agreeable with outpatient palliative referral but clarifies this is NOT hospice with me. I explained difference between palliative and hospice.   [name] has PMT contact information and understands he can call with questions or concerns. He tells me "I live for mom" and how much he has invested in caring for her. Emotional support provided.     Length of Stay: 10  Current Medications: Scheduled Meds:  . amLODipine  10 mg Oral Daily  . calcium carbonate  1 tablet Oral TID WC  . Chlorhexidine Gluconate Cloth  6 each Topical Q0600  . epoetin (EPOGEN/PROCRIT) injection  4,000 Units Intravenous Q M,W,F-HD  . feeding supplement (ENSURE ENLIVE)  237 mL Oral TID BM  . feeding supplement (PRO-STAT SUGAR FREE 64)  30 mL Oral TID WC  . folic acid  0.5 mg Oral Daily  . Ganciclovir  1 drop Right Eye QID  . heparin  5,000 Units Subcutaneous Q8H  . hydrALAZINE  25 mg Oral QID  . insulin aspart  0-5 Units Subcutaneous QHS  . insulin aspart  0-9 Units Subcutaneous TID WC  . levothyroxine  75 mcg Oral Daily  . metoprolol tartrate  50 mg Oral BID  . multivitamin  1 tablet Oral QHS  . pantoprazole  40 mg Oral BID AC  . polyethylene glycol  17 g Oral Daily  . pravastatin  40 mg Oral Daily  . sodium chloride flush  3 mL Intravenous Q12H  . ascorbic acid  500 mg Oral BID    Continuous Infusions: . sodium chloride Stopped (06/28/17 1900)  . valproate sodium Stopped (07/07/17 0609)    PRN Meds: sodium chloride, acetaminophen **OR** acetaminophen, albuterol, bisacodyl, guaiFENesin-dextromethorphan, heparin, HYDROcodone-acetaminophen, ipratropium-albuterol, lidocaine-prilocaine, ondansetron **OR** ondansetron (ZOFRAN) IV, polyvinyl alcohol, senna-docusate, sodium chloride flush  Physical Exam  Constitutional: She is easily aroused. She appears ill.  HENT:  Head: Normocephalic and atraumatic.  Pulmonary/Chest: No  accessory muscle usage. No tachypnea. No respiratory distress.  Neurological: She is easily aroused.  Drowsy, not following commands  Skin: Skin is warm and dry.  Psychiatric: Her speech is delayed. Cognition and memory are impaired. She is inattentive.  Nursing note and vitals reviewed.   Vital Signs: BP (!) 129/52 (BP Location: Right Arm)   Pulse 92   Temp (!) 97.5 F (36.4 C) (Oral)   Resp (!) 25   Ht 5\' 2"  (1.575 m)    Wt 41.1 kg (90 lb 9.7 oz)   SpO2 97%   BMI 16.57 kg/m  SpO2: SpO2: 97 % O2 Device: O2 Device: Room Air O2 Flow Rate: O2 Flow Rate (L/min): 1 L/min  Intake/output summary:   Intake/Output Summary (Last 24 hours) at 07/07/2017 1439 Last data filed at 07/07/2017 0536 Gross per 24 hour  Intake 86.27 ml  Output -  Net 86.27 ml   LBM: Last BM Date: 07/06/17 Baseline Weight: Weight: 44 kg (97 lb) Most recent weight: Weight: 41.1 kg (90 lb 9.7 oz)       Palliative Assessment/Data: PPS 30%   Flowsheet Rows     Most Recent Value  Intake Tab  Referral Department  Hospitalist  Unit at Time of Referral  Med/Surg Unit  Palliative Care Primary Diagnosis  Cardiac  Date Notified  06/28/17  Palliative Care Type  New Palliative care  Reason for referral  Clarify Goals of Care  Date of Admission  06/27/17  Date first seen by Palliative Care  06/29/17  # of days Palliative referral response time  1 Day(s)  # of days IP prior to Palliative referral  1  Clinical Assessment  Palliative Performance Scale Score  30%  Psychosocial & Spiritual Assessment  Palliative Care Outcomes  Patient/Family meeting held?  Yes  Who was at the meeting?  son [name]  Palliative Care Outcomes  Clarified goals of care      Patient Active Problem List   Diagnosis Date Noted  . Failure to thrive in adult   . Goals of care, counseling/discussion   . Palliative care by specialist   . Acute respiratory failure with hypoxia ([place]) 06/27/2017  . Pressure injury of skin 05/19/2017  . GIB (gastrointestinal bleeding) 05/17/2017  . GI bleed 05/05/2017  . Pelvic fracture ([place]) 04/26/2017  . Protein-calorie malnutrition, severe 01/07/2017  . CHF (congestive heart failure) ([place]) 01/03/2017  . UTI (urinary tract infection) 01/03/2017  . Acute metabolic encephalopathy 11/01/2534  . Cerebrovascular accident (CVA) due to occlusion of right middle cerebral artery ([place]) 10/24/2015  . Malnutrition of mild degree ([place])  10/24/2015  . Altered mental status 09/18/2015  . Preventative health care 05/03/2014  . Peripheral vascular disease ([place]) 12/21/2013  . Toe amputation status ([place]) 08/29/2013  . History of recent fall 04/18/2013  . HTN (hypertension), malignant 03/22/2013  . S/P aortic valve replacement with bioprosthetic valve 11/15/2012  . S/P CABG x 3 11/15/2012  . Anemia of chronic kidney failure 12/22/2010  . Thrombocytopenia ([place]) 12/13/2010  . End stage renal disease on dialysis ([place]) 12/13/2010  . S/P aortic valve replacement 08/11/2010  . Arterial insufficiency-lower 07/25/2010  . NEURODERMATITIS 04/08/2009  . Carotid arterial disease ([place]) 01/01/2009  . Coronary atherosclerosis 02/11/2007  . AORTIC STENOSIS 10/04/2006  . ANXIETY 05/30/2006  . Pulmonary hypertension ([place]) 05/30/2006  . Hypothyroidism 05/27/2006  . Type 2 diabetes mellitus with renal manifestations, controlled ([place]) 05/27/2006  . Hyperlipidemia 05/27/2006  . Essential hypertension 05/27/2006  . GERD 05/27/2006  . OSTEOARTHRITIS  05/27/2006  . OSTEOPENIA 05/27/2006  . URINARY INCONTINENCE 05/27/2006    Palliative Care Assessment & Plan   HPI: 84 y.o. female  with past medical history of ESRD on HD, CAD, diastolic HF, AVR 3338, DM, HTN, hypothyroidism, PVD, pulm htn,  admitted on 06/27/2017 with respiratory failure d/t volume overload and UTI. Also had elevated tropponins most likely r/t ESRD. Now patient cannot eat or take PO meds. HD was scheduled for 6/26 but could not complete d/t lethargy and fistula complications. PMT consulted for [place].  Assessment: ESRD on hemodialysis Acute respiratory failure with hypoxia Pulmonary edema Acute on chronic diastolic heart failure Acute UTI Diabetes mellitus type 2 Severe protein-calorie malnutrition Adult failure to thrive  Recommendations/Plan:  DNR. Otherwise, continue current interventions including dialysis.   Watchful waiting. Son hopeful for improvement at rehab  facility and return back to ALF.  Encouraged son to continue conversations with his mother and care team regarding her goals and wishes.  Palliative services to follow at SNF on discharge.   Code Status:  DNR  Prognosis:   Unable to determine poor long-term prognosis secondary to ESRD on hemodialysis. Declining functional, cognitive, and nutritional status. High risk for decline leading to recurrent hospitalization.   Discharge Planning:  To Be Determined   Care plan was discussed with patient, Dr. Darvin Neighbours, Dr Candiss Norse, son [name])  Thank you for allowing the Palliative Medicine Team to assist in the care of this patient.   Total Time 47min Prolonged Time Billed  no       Greater than 50%  of this time was spent counseling and coordinating care related to the above assessment and plan.  Ihor Dow, FNP-C Palliative Medicine Team  Phone: [phone] Fax: [phone]

## 2017-07-07 NOTE — Care Management Important Message (Signed)
Copy of signed IM left with patient in room.  

## 2017-07-07 NOTE — Progress Notes (Signed)
Pt sitting in chair for dialysis treatment.

## 2017-07-07 NOTE — Progress Notes (Signed)
Post HD assessment    07/07/17 1637  Neurological  Level of Consciousness Alert  Orientation Level Oriented X4  Respiratory  Respiratory Pattern Dyspnea at rest;Tachypnea  Chest Assessment Chest expansion symmetrical  Bilateral Breath Sounds Diminished  Cardiac  ECG Monitor Yes  Vascular  R Radial Pulse +2  L Radial Pulse +2  Integumentary  Integumentary (WDL) X  Skin Color Pale  Musculoskeletal  Musculoskeletal (WDL) X  Generalized Weakness Yes  Assistive Device None  GU Assessment  Genitourinary (WDL) X  Genitourinary Symptoms  (HD)  Psychosocial  Psychosocial (WDL) WDL

## 2017-07-08 DIAGNOSIS — Z515 Encounter for palliative care: Secondary | ICD-10-CM | POA: Diagnosis not present

## 2017-07-08 DIAGNOSIS — K219 Gastro-esophageal reflux disease without esophagitis: Secondary | ICD-10-CM | POA: Diagnosis not present

## 2017-07-08 DIAGNOSIS — E785 Hyperlipidemia, unspecified: Secondary | ICD-10-CM | POA: Diagnosis not present

## 2017-07-08 DIAGNOSIS — Z8781 Personal history of (healed) traumatic fracture: Secondary | ICD-10-CM | POA: Diagnosis not present

## 2017-07-08 DIAGNOSIS — Z992 Dependence on renal dialysis: Secondary | ICD-10-CM | POA: Diagnosis not present

## 2017-07-08 DIAGNOSIS — D649 Anemia, unspecified: Secondary | ICD-10-CM | POA: Diagnosis not present

## 2017-07-08 DIAGNOSIS — I2510 Atherosclerotic heart disease of native coronary artery without angina pectoris: Secondary | ICD-10-CM | POA: Diagnosis not present

## 2017-07-08 DIAGNOSIS — I120 Hypertensive chronic kidney disease with stage 5 chronic kidney disease or end stage renal disease: Secondary | ICD-10-CM | POA: Diagnosis not present

## 2017-07-08 DIAGNOSIS — E569 Vitamin deficiency, unspecified: Secondary | ICD-10-CM | POA: Diagnosis not present

## 2017-07-08 DIAGNOSIS — G9340 Encephalopathy, unspecified: Secondary | ICD-10-CM | POA: Diagnosis not present

## 2017-07-08 DIAGNOSIS — N2581 Secondary hyperparathyroidism of renal origin: Secondary | ICD-10-CM | POA: Diagnosis not present

## 2017-07-08 DIAGNOSIS — R531 Weakness: Secondary | ICD-10-CM | POA: Diagnosis not present

## 2017-07-08 DIAGNOSIS — J9600 Acute respiratory failure, unspecified whether with hypoxia or hypercapnia: Secondary | ICD-10-CM | POA: Diagnosis not present

## 2017-07-08 DIAGNOSIS — N3000 Acute cystitis without hematuria: Secondary | ICD-10-CM | POA: Diagnosis not present

## 2017-07-08 DIAGNOSIS — L89899 Pressure ulcer of other site, unspecified stage: Secondary | ICD-10-CM | POA: Diagnosis not present

## 2017-07-08 DIAGNOSIS — M6281 Muscle weakness (generalized): Secondary | ICD-10-CM | POA: Diagnosis not present

## 2017-07-08 DIAGNOSIS — K5713 Diverticulitis of small intestine without perforation or abscess with bleeding: Secondary | ICD-10-CM | POA: Diagnosis not present

## 2017-07-08 DIAGNOSIS — D631 Anemia in chronic kidney disease: Secondary | ICD-10-CM | POA: Diagnosis not present

## 2017-07-08 DIAGNOSIS — J811 Chronic pulmonary edema: Secondary | ICD-10-CM | POA: Diagnosis not present

## 2017-07-08 DIAGNOSIS — I509 Heart failure, unspecified: Secondary | ICD-10-CM | POA: Diagnosis not present

## 2017-07-08 DIAGNOSIS — J9620 Acute and chronic respiratory failure, unspecified whether with hypoxia or hypercapnia: Secondary | ICD-10-CM | POA: Diagnosis not present

## 2017-07-08 DIAGNOSIS — I10 Essential (primary) hypertension: Secondary | ICD-10-CM | POA: Diagnosis not present

## 2017-07-08 DIAGNOSIS — Z8719 Personal history of other diseases of the digestive system: Secondary | ICD-10-CM | POA: Diagnosis not present

## 2017-07-08 DIAGNOSIS — N186 End stage renal disease: Secondary | ICD-10-CM | POA: Diagnosis not present

## 2017-07-08 DIAGNOSIS — E039 Hypothyroidism, unspecified: Secondary | ICD-10-CM | POA: Diagnosis not present

## 2017-07-08 LAB — GLUCOSE, CAPILLARY
GLUCOSE-CAPILLARY: 88 mg/dL (ref 70–99)
GLUCOSE-CAPILLARY: 177 mg/dL — AB (ref 70–99)

## 2017-07-08 MED ORDER — DIVALPROEX SODIUM 125 MG PO DR TAB: 625.0000 mg | DELAYED_RELEASE_TABLET | Freq: Two times a day (BID) | ORAL | 0 refills | Status: AC

## 2017-07-08 MED ORDER — IPRATROPIUM-ALBUTEROL 0.5-2.5 (3) MG/3ML IN SOLN: 3.0000 mL | Freq: Four times a day (QID) | RESPIRATORY_TRACT | 0 refills | Status: AC | PRN

## 2017-07-08 MED ORDER — CHLORHEXIDINE GLUCONATE CLOTH 2 % EX PADS: 6.0000 | MEDICATED_PAD | Freq: Every day | CUTANEOUS | 0 refills | Status: AC

## 2017-07-08 NOTE — Progress Notes (Signed)
Report called to [facility] at [facility]. Pt will transport to [facility] via EMS.

## 2017-07-08 NOTE — Progress Notes (Signed)
[facility]  ROUNDING NOTE   Subjective:   Patient is alert and clinically looks good Able to eat. No N/V reported Able to complete HD in chair  Objective:  Vital signs in last 24 hours:  Temp:  [97.5 F (36.4 C)-98.2 F (36.8 C)] 98 F (36.7 C) (07/04 0307) Pulse Rate:  [83-100] 99 (07/04 0307) Resp:  [19-34] 22 (07/04 0307) BP: (104-173)/(46-57) 144/52 (07/04 0307) SpO2:  [95 %-100 %] 97 % (07/04 0307) Weight:  [35.9 kg (79 lb 3.2 oz)-41.1 kg (90 lb 9.7 oz)] 36.8 kg (81 lb 3.2 oz) (07/04 1134)  Weight change:  Filed Weights   07/07/17 1638 07/08/17 1133 07/08/17 1134  Weight: 40.1 kg (88 lb 6.5 oz) 35.9 kg (79 lb 3.2 oz) 36.8 kg (81 lb 3.2 oz)    Intake/Output: I/O last 3 completed shifts: In: 26.3 [IV Piggyback:26.3] Out: 1416 [Other:1416]   Intake/Output this shift:  Total I/O In: 240 [P.O.:240] Out: -   Physical Exam: General: NAD, resting comfortably  Head: Normocephalic, atraumatic. Moist oral mucosal membranes  Eyes: Anicteric,    Neck: Supple, trachea midline  Lungs:  Clear to auscultation  Heart: No rub, murmur present  Abdomen:  Soft, nontender,   Extremities:  No peripheral edema.  Neurologic:  Alert and oriented  Skin: No lesions  Access: Left AVF     Basic Metabolic Panel: Recent Labs  Lab 07/04/17 1019 07/05/17 1112  NA 135 134*  K 4.1 3.8  CL 99 95*  CO2 29 29  GLUCOSE 117* 145*  BUN 18 16  CREATININE 2.54* 2.59*  CALCIUM 8.2* 8.5*  PHOS 2.0* 2.7    Liver Function Tests: Recent Labs  Lab 07/04/17 1019 07/05/17 1112  ALBUMIN 2.3* 2.4*   No results for input(s): LIPASE, AMYLASE in the last 168 hours. No results for input(s): AMMONIA in the last 168 hours.  CBC: Recent Labs  Lab 07/04/17 1019 07/05/17 1112  WBC 5.2 5.8  HGB 11.8* 11.7*  HCT 35.4 35.5  MCV 95.3 96.3  PLT 176 164    Cardiac Enzymes: No results for input(s): CKTOTAL, CKMB, CKMBINDEX, TROPONINI in the last 168 hours.  BNP: Invalid  input(s): POCBNP  CBG: Recent Labs  Lab 07/07/17 1159 07/07/17 1726 07/07/17 2140 07/08/17 0747 07/08/17 1130  GLUCAP 125* 99 120* 88 177*    Microbiology: Results for orders placed or performed during the hospital encounter of 06/27/17  Blood Culture (routine x 2)     Status: None   Collection Time: 06/27/17  1:59 PM  Result Value Ref Range Status   Specimen Description BLOOD RIGHT ARM  Final   Special Requests   Final    BOTTLES DRAWN AEROBIC AND ANAEROBIC Blood Culture adequate volume   Culture   Final    NO GROWTH 5 DAYS Performed at [facility], [address], [place], [place] [address]    Report Status 07/02/2017 FINAL  Final  Blood Culture (routine x 2)     Status: None   Collection Time: 06/27/17  1:59 PM  Result Value Ref Range Status   Specimen Description BLOOD RIGHT ARM  Final   Special Requests   Final    BOTTLES DRAWN AEROBIC AND ANAEROBIC Blood Culture adequate volume   Culture   Final    NO GROWTH 5 DAYS Performed at [facility], [address], [place], [place] [address]    Report Status 07/02/2017 FINAL  Final  Urine culture     Status: Abnormal  Collection Time: 06/27/17  3:05 PM  Result Value Ref Range Status   Specimen Description   Final    URINE, RANDOM Performed at [facility], [address] [place], [place] [address]    Special Requests   Final    NONE Performed at [facility], [address] [place], [place] [address]    Culture (A)  Final    >=100,000 COLONIES/mL ESCHERICHIA COLI >=100,000 COLONIES/mL KLEBSIELLA ORNITHINOLYTICA    Report Status 06/30/2017 FINAL  Final   Organism ID, Bacteria ESCHERICHIA COLI (A)  Final   Organism ID, Bacteria KLEBSIELLA ORNITHINOLYTICA (A)  Final      Susceptibility   Escherichia coli - MIC*    AMPICILLIN >=32 RESISTANT Resistant     CEFAZOLIN 8 SENSITIVE Sensitive     CEFTRIAXONE <=1 SENSITIVE Sensitive     CIPROFLOXACIN >=4 RESISTANT Resistant      GENTAMICIN >=16 RESISTANT Resistant     IMIPENEM <=0.25 SENSITIVE Sensitive     NITROFURANTOIN 32 SENSITIVE Sensitive     TRIMETH/SULFA <=20 SENSITIVE Sensitive     AMPICILLIN/SULBACTAM >=32 RESISTANT Resistant     PIP/TAZO <=4 SENSITIVE Sensitive     Extended ESBL NEGATIVE Sensitive     * >=100,000 COLONIES/mL ESCHERICHIA COLI   Klebsiella ornithinolytica - MIC*    AMPICILLIN >=32 RESISTANT Resistant     CEFAZOLIN <=4 SENSITIVE Sensitive     CEFTRIAXONE <=1 SENSITIVE Sensitive     CIPROFLOXACIN <=0.25 SENSITIVE Sensitive     GENTAMICIN <=1 SENSITIVE Sensitive     IMIPENEM <=0.25 SENSITIVE Sensitive     NITROFURANTOIN 32 SENSITIVE Sensitive     TRIMETH/SULFA <=20 SENSITIVE Sensitive     AMPICILLIN/SULBACTAM 4 SENSITIVE Sensitive     PIP/TAZO <=4 SENSITIVE Sensitive     * >=100,000 COLONIES/mL KLEBSIELLA ORNITHINOLYTICA  MRSA PCR Screening     Status: None   Collection Time: 06/27/17  4:54 PM  Result Value Ref Range Status   MRSA by PCR NEGATIVE NEGATIVE Final    Comment:        The GeneXpert MRSA Assay (FDA approved for NASAL specimens only), is one component of a comprehensive MRSA colonization surveillance program. It is not intended to diagnose MRSA infection nor to guide or monitor treatment for MRSA infections. Performed at [facility], [address] [place], [place] [address]     Coagulation Studies: No results for input(s): LABPROT, INR in the last 72 hours.  Urinalysis: No results for input(s): COLORURINE, LABSPEC, PHURINE, GLUCOSEU, HGBUR, BILIRUBINUR, KETONESUR, PROTEINUR, UROBILINOGEN, NITRITE, LEUKOCYTESUR in the last 72 hours.  Invalid input(s): APPERANCEUR    Imaging: No results found.   Medications:   . sodium chloride Stopped (06/28/17 1900)  . valproate sodium Stopped (07/08/17 1134)   . amLODipine  10 mg Oral Daily  . calcium carbonate  1 tablet Oral TID WC  . Chlorhexidine Gluconate Cloth  6 each Topical Q0600  . epoetin  (EPOGEN/PROCRIT) injection  4,000 Units Intravenous Q M,W,F-HD  . feeding supplement (ENSURE ENLIVE)  237 mL Oral TID BM  . feeding supplement (PRO-STAT SUGAR FREE 64)  30 mL Oral TID WC  . folic acid  0.5 mg Oral Daily  . Ganciclovir  1 drop Right Eye QID  . heparin  5,000 Units Subcutaneous Q8H  . hydrALAZINE  25 mg Oral QID  . insulin aspart  0-5 Units Subcutaneous QHS  . insulin aspart  0-9 Units Subcutaneous TID WC  . levothyroxine  75 mcg Oral Daily  .  metoprolol tartrate  50 mg Oral BID  . multivitamin  1 tablet Oral QHS  . pantoprazole  40 mg Oral BID AC  . polyethylene glycol  17 g Oral Daily  . pravastatin  40 mg Oral Daily  . sodium chloride flush  3 mL Intravenous Q12H  . ascorbic acid  500 mg Oral BID   sodium chloride, acetaminophen **OR** acetaminophen, albuterol, bisacodyl, guaiFENesin-dextromethorphan, HYDROcodone-acetaminophen, ipratropium-albuterol, lidocaine-prilocaine, ondansetron **OR** ondansetron (ZOFRAN) IV, polyvinyl alcohol, senna-docusate, sodium chloride flush  Assessment/ Plan:  Ms. ARTA STUMP is a 84 y.o. white  female with end stage renal disease on hemodialysis, coronary artery disease, history of bilateral knee replacement, aortic valve replacement 2009, diabetes, hypertension, hypothyroidism, peripheral vascular disease, pulmonary hypertension, anxiety was admitted on   [address]/MWF  1. End Stage Renal Disease:  Patient has been tolerating her hemodialysis treatments  Yesterday's treatment done in chair Standing weight today 36 kg To be d/c to [facility]  2. Anemia of chronic kidney disease: hemoglobin 11.7 - mircera as outpatient   3. Secondary Hyperparathyroidism:  - monitor phos  Currently 2.7  4. Acute resp failure with hypoxia due to acute pulm edema - improved   5. UTI (E Coli, Klebsiella 06/27/17) - completed Abx    LOS: 11 [facility] 7/4/201911:53 AM

## 2017-07-08 NOTE — Clinical Social Work Note (Signed)
Tammy at [employer] informed CSW that they received authorization. Patient is able to discharge today to [facility]. Patient's son is aware and will transport patient. [facility] at [facility] has discharge information. Shela Leff MSW,LCSW [phone]

## 2017-07-08 NOTE — Discharge Summary (Signed)
[facility] [place] NAME: [name]    MR#:  [id number]  DATE OF BIRTH:  1920-08-27  DATE OF ADMISSION:  06/27/2017 ADMITTING PHYSICIAN: Demetrios Loll, MD  DATE OF DISCHARGE: No discharge date for patient encounter.  PRIMARY CARE PHYSICIAN: [name], MD    ADMISSION DIAGNOSIS:  Acute cystitis with hematuria [N30.01] Acute respiratory failure with hypoxia ([place]) [J96.01]  DISCHARGE DIAGNOSIS:  Active Problems:   Acute respiratory failure with hypoxia (HCC)   Goals of care, counseling/discussion   Palliative care by specialist   Failure to thrive in adult   ESRD on hemodialysis ([place])   SECONDARY DIAGNOSIS:   Past Medical History:  Diagnosis Date  . Anemia    NOS  . Anxiety   . Aortic stenosis   . Cancer [facility])    Mole on top of head to be removed in July 2013  . Chronic diastolic CHF (congestive heart failure) ([place])    a. 01/2017 Echo: EF 60-65%, no rwma, Gr2 DD, mild to mod MR, mildly dil LA/RA, nl RV fxn, Sev TR, PASP 62mmHg.  . Constipation   . Coronary artery disease    a. 2009 CABG x 3 w/ bioprosthetic AVR.  . Diabetes mellitus    type II;was on Glipizide but has been off x 91mon  . Dialysis patient ([place])   . Diarrhea   . ESRD (end stage renal disease) ([place])   . GERD (gastroesophageal reflux disease)   . History of blood transfusion   . Hyperlipidemia    takes Simvastatin nightly  . Hypertension    takes Metoprolol daily  . Hypothyroidism    takes Synthroid daily  . Migraine    hx of  . Oligouria   . Osteoarthritis   . Osteopenia   . Peripheral vascular disease ([place])   . Personal history of colonic polyps   . Pulmonary hypertension ([place])   . Urinary incontinence   . UTI (urinary tract infection)     HOSPITAL COURSE:  *Acute respiratory failure with hypoxia due to pulmonary edema Resolved with HD treatments  * Acute on chronic diastolic chf Resolved Due to acute fluid overload  *Acute  probable UTI Resolved  Treated with course of cefdinir  *Chronic diabetes mellitus type 2  Stable Sliding scale insulin with active checks per routine  *Chronic deconditioning, adult failure to thrive Progressive worsening Prognosis dismal  *severe protein energy/calorie malnutrition Dietary consulted while in house  Prognosis dismal   Multiple co morbidities and at 89 she will likely deteriorate quickly.  High risk for complications.  Discussed with son.  Not ready for hospice.  Would like to continue hemodialysis and skilled nursing facility for physical therapy.  Palliative care services to be continued status post discharge  DISCHARGE CONDITIONS:   stable  CONSULTS OBTAINED:  Treatment Team:  Anthonette Legato, MD Schnier, Dolores Lory, MD Alexis Goodell, MD  DRUG ALLERGIES:   Allergies  Allergen Reactions  . Nitrofurantoin Itching  . Ace Inhibitors   . Captopril Other (See Comments)    REACTION: unspecified  . Enalapril Maleate Cough  . Ramipril Other (See Comments)    REACTION: unspecified  . Sulfa Antibiotics Other (See Comments)    Reaction unknown  . Verapamil Other (See Comments)    REACTION: unspecified    DISCHARGE MEDICATIONS:   Allergies as of 07/08/2017      Reactions   Nitrofurantoin Itching   Ace Inhibitors    Captopril Other (See  Comments)   REACTION: unspecified   Enalapril Maleate Cough   Ramipril Other (See Comments)   REACTION: unspecified   Sulfa Antibiotics Other (See Comments)   Reaction unknown   Verapamil Other (See Comments)   REACTION: unspecified      Medication List    STOP taking these medications   HYDROcodone-acetaminophen 5-325 MG tablet Commonly known as:  NORCO/VICODIN     TAKE these medications   acetaminophen 325 MG tablet Commonly known as:  TYLENOL Take 1 tablet (325 mg total) by mouth every 6 (six) hours as needed for mild pain or fever (or Fever >/= 101).   albuterol (2.5 MG/3ML) 0.083% nebulizer  solution Commonly known as:  PROVENTIL Take 3 mLs (2.5 mg total) by nebulization every 2 (two) hours as needed for wheezing.   amLODipine 10 MG tablet Commonly known as:  NORVASC Take 1 tablet (10 mg total) by mouth daily.   ascorbic acid 500 MG tablet Commonly known as:  VITAMIN C Take 1 tablet (500 mg total) by mouth daily. What changed:  when to take this   calcium carbonate 500 MG chewable tablet Commonly known as:  TUMS - dosed in mg elemental calcium Chew 1 tablet (200 mg of elemental calcium total) by mouth 3 (three) times daily with meals.   CEROVITE SENIOR Tabs Take 1 tablet by mouth daily.   Chlorhexidine Gluconate Cloth 2 % Pads Apply 6 each topically daily at 6 (six) AM. Start taking on:  07/09/2017   divalproex 125 MG DR tablet Commonly known as:  DEPAKOTE Take 5 tablets (625 mg total) by mouth 2 (two) times daily.   epoetin alfa 10000 UNIT/ML injection Commonly known as:  EPOGEN,PROCRIT Inject 1 mL (10,000 Units total) into the vein every Monday, Wednesday, and Friday with hemodialysis.   feeding supplement (ENSURE ENLIVE) Liqd Take 237 mLs by mouth 3 (three) times daily between meals.   feeding supplement (PRO-STAT SUGAR FREE 64) Liqd Take 30 mLs by mouth 3 (three) times daily with meals.   folic acid 0.5 MG tablet Commonly known as:  FOLVITE Take 0.5 tablets (0.5 mg total) by mouth daily.   Ganciclovir 0.15 % Gel Commonly known as:  ZIRGAN Place 1 drop into the right eye 4 (four) times daily.   hydrALAZINE 25 MG tablet Commonly known as:  APRESOLINE Take 25 mg by mouth 4 (four) times daily.   insulin aspart 100 UNIT/ML injection Commonly known as:  novoLOG Inject 0-9 Units into the skin 3 (three) times daily with meals. CBG < 70: implement hypoglycemia protocol CBG 70 - 120: 0 units CBG 121 - 150: 1 unit CBG 151 - 200: 2 units CBG 201 - 250: 3 units CBG 251 - 300: 5 units CBG 301 - 350: 7 units CBG 351 - 400: 9 units CBG > 400: call MD and  obtain STAT lab verification   ipratropium-albuterol 0.5-2.5 (3) MG/3ML Soln Commonly known as:  DUONEB Take 3 mLs by nebulization every 6 (six) hours as needed.   levothyroxine 75 MCG tablet Commonly known as:  SYNTHROID, LEVOTHROID TAKE ONE TABLET BY MOUTH ONCE DAILY   metoprolol tartrate 50 MG tablet Commonly known as:  LOPRESSOR TAKE ONE TABLET BY MOUTH TWICE DAILY What changed:    how much to take  how to take this  when to take this   multivitamin Tabs tablet Take 1 tablet by mouth at bedtime.   ondansetron 4 MG tablet Commonly known as:  ZOFRAN Take 1 tablet (4 mg total)  by mouth every 6 (six) hours as needed for nausea.   pantoprazole 40 MG tablet Commonly known as:  PROTONIX Take 1 tablet (40 mg total) by mouth 2 (two) times daily before a meal.   polyethylene glycol packet Commonly known as:  MIRALAX / GLYCOLAX Take 17 g by mouth daily as needed for mild constipation. What changed:  when to take this   polyvinyl alcohol 1.4 % ophthalmic solution Commonly known as:  LIQUIFILM TEARS Place 1 drop into both eyes as needed for dry eyes.   pravastatin 40 MG tablet Commonly known as:  PRAVACHOL TAKE ONE TABLET BY MOUTH IN THE EVENING What changed:    how much to take  how to take this  when to take this   senna-docusate 8.6-50 MG tablet Commonly known as:  Senokot-S Take 1 tablet by mouth at bedtime as needed for mild constipation.   zinc sulfate 220 (50 Zn) MG capsule Take 220 mg by mouth daily.            Durable Medical Equipment  (From admission, onward)        Start     Ordered   06/30/17 1344  For home use only DME oxygen  Once    Question Answer Comment  Mode or (Route) Nasal cannula   Liters per Minute 2   Frequency Continuous (stationary and portable oxygen unit needed)   Oxygen delivery system Gas      06/30/17 1343       DISCHARGE INSTRUCTIONS:  Discharge with palliative care services If you experience worsening of your  admission symptoms, develop shortness of breath, life threatening emergency, suicidal or homicidal thoughts you must seek medical attention immediately by calling 911 or calling your MD immediately  if symptoms less severe.  You Must read complete instructions/literature along with all the possible adverse reactions/side effects for all the Medicines you take and that have been prescribed to you. Take any new Medicines after you have completely understood and accept all the possible adverse reactions/side effects.   Please note  You were cared for by a hospitalist during your hospital stay. If you have any questions about your discharge medications or the care you received while you were in the hospital after you are discharged, you can call the unit and asked to speak with the hospitalist on call if the hospitalist that took care of you is not available. Once you are discharged, your primary care physician will handle any further medical issues. Please note that NO REFILLS for any discharge medications will be authorized once you are discharged, as it is imperative that you return to your primary care physician (or establish a relationship with a primary care physician if you do not have one) for your aftercare needs so that they can reassess your need for medications and monitor your lab values.    Today   CHIEF COMPLAINT:   Chief Complaint  Patient presents with  . Nasal Congestion  . Weakness    HISTORY OF PRESENT ILLNESS:  84 y.o. female with a known history of multiple medical problems including ESRD on hemodialysis, hypertension, Chronic diastolic CHF, diabetes, CAD, UTI, hyperlipidemia, PAD, pulmonary hypertension and hypothyroidism.  The patient has had cough, sputum and the shortness of breath for a few days.  In addition, she has generalized weakness.  She was just [facility] to [facility] last week.  She is found hypoxia and put on oxygen by nasal cannula 2 L in the ED.  Chest x-ray show pulmonary congestion and possible pneumonia.  Urinalysis showed UTI.  The patient is very hard of hearing.  Information is obtained from her son.  VITAL SIGNS:  Blood pressure (!) 144/52, pulse 99, temperature 98 F (36.7 C), temperature source Oral, resp. rate (!) 22, height 5\' 2"  (1.575 m), weight 40.1 kg (88 lb 6.5 oz), SpO2 97 %.  I/O:    Intake/Output Summary (Last 24 hours) at 07/08/2017 1000 Last data filed at 07/07/2017 1638 Gross per 24 hour  Intake -  Output 1416 ml  Net -1416 ml    PHYSICAL EXAMINATION:  GENERAL:  84 y.o.-year-old patient lying in the bed with no acute distress.  EYES: Pupils equal, round, reactive to light and accommodation. No scleral icterus. Extraocular muscles intact.  HEENT: Head atraumatic, normocephalic. Oropharynx and nasopharynx clear.  NECK:  Supple, no jugular venous distention. No thyroid enlargement, no tenderness.  LUNGS: Normal breath sounds bilaterally, no wheezing, rales,rhonchi or crepitation. No use of accessory muscles of respiration.  CARDIOVASCULAR: S1, S2 normal. No murmurs, rubs, or gallops.  ABDOMEN: Soft, non-tender, non-distended. Bowel sounds present. No organomegaly or mass.  EXTREMITIES: No pedal edema, cyanosis, or clubbing.  NEUROLOGIC: Cranial nerves II through XII are intact. Muscle strength 5/5 in all extremities. Sensation intact. Gait not checked.  PSYCHIATRIC: The patient is alert and oriented x 3.  SKIN: No obvious rash, lesion, or ulcer.   DATA REVIEW:   CBC Recent Labs  Lab 07/05/17 1112  WBC 5.8  HGB 11.7*  HCT 35.5  PLT 164    Chemistries  Recent Labs  Lab 07/05/17 1112  NA 134*  K 3.8  CL 95*  CO2 29  GLUCOSE 145*  BUN 16  CREATININE 2.59*  CALCIUM 8.5*    Cardiac Enzymes No results for input(s): TROPONINI in the last 168 hours.  Microbiology Results  Results for orders placed or performed during the hospital encounter of 06/27/17  Blood Culture (routine x 2)      Status: None   Collection Time: 06/27/17  1:59 PM  Result Value Ref Range Status   Specimen Description BLOOD RIGHT ARM  Final   Special Requests   Final    BOTTLES DRAWN AEROBIC AND ANAEROBIC Blood Culture adequate volume   Culture   Final    NO GROWTH 5 DAYS Performed at [facility], [address], [place], [place] [address]    Report Status 07/02/2017 FINAL  Final  Blood Culture (routine x 2)     Status: None   Collection Time: 06/27/17  1:59 PM  Result Value Ref Range Status   Specimen Description BLOOD RIGHT ARM  Final   Special Requests   Final    BOTTLES DRAWN AEROBIC AND ANAEROBIC Blood Culture adequate volume   Culture   Final    NO GROWTH 5 DAYS Performed at [facility], [address], [place], [place] [address]    Report Status 07/02/2017 FINAL  Final  Urine culture     Status: Abnormal   Collection Time: 06/27/17  3:05 PM  Result Value Ref Range Status   Specimen Description   Final    URINE, RANDOM Performed at [facility], [address], [place], [place] [address]    Special Requests   Final    NONE Performed at [facility], [address] [place], [place] [address]    Culture (A)  Final    >=100,000 COLONIES/mL ESCHERICHIA COLI >=100,000 COLONIES/mL KLEBSIELLA ORNITHINOLYTICA  Report Status 06/30/2017 FINAL  Final   Organism ID, Bacteria ESCHERICHIA COLI (A)  Final   Organism ID, Bacteria KLEBSIELLA ORNITHINOLYTICA (A)  Final      Susceptibility   Escherichia coli - MIC*    AMPICILLIN >=32 RESISTANT Resistant     CEFAZOLIN 8 SENSITIVE Sensitive     CEFTRIAXONE <=1 SENSITIVE Sensitive     CIPROFLOXACIN >=4 RESISTANT Resistant     GENTAMICIN >=16 RESISTANT Resistant     IMIPENEM <=0.25 SENSITIVE Sensitive     NITROFURANTOIN 32 SENSITIVE Sensitive     TRIMETH/SULFA <=20 SENSITIVE Sensitive     AMPICILLIN/SULBACTAM >=32 RESISTANT Resistant     PIP/TAZO <=4 SENSITIVE Sensitive     Extended ESBL NEGATIVE  Sensitive     * >=100,000 COLONIES/mL ESCHERICHIA COLI   Klebsiella ornithinolytica - MIC*    AMPICILLIN >=32 RESISTANT Resistant     CEFAZOLIN <=4 SENSITIVE Sensitive     CEFTRIAXONE <=1 SENSITIVE Sensitive     CIPROFLOXACIN <=0.25 SENSITIVE Sensitive     GENTAMICIN <=1 SENSITIVE Sensitive     IMIPENEM <=0.25 SENSITIVE Sensitive     NITROFURANTOIN 32 SENSITIVE Sensitive     TRIMETH/SULFA <=20 SENSITIVE Sensitive     AMPICILLIN/SULBACTAM 4 SENSITIVE Sensitive     PIP/TAZO <=4 SENSITIVE Sensitive     * >=100,000 COLONIES/mL KLEBSIELLA ORNITHINOLYTICA  MRSA PCR Screening     Status: None   Collection Time: 06/27/17  4:54 PM  Result Value Ref Range Status   MRSA by PCR NEGATIVE NEGATIVE Final    Comment:        The GeneXpert MRSA Assay (FDA approved for NASAL specimens only), is one component of a comprehensive MRSA colonization surveillance program. It is not intended to diagnose MRSA infection nor to guide or monitor treatment for MRSA infections. Performed at [facility], [address], [place], [place] [address]     RADIOLOGY:  No results found.  EKG:   Orders placed or performed during the hospital encounter of 06/27/17  . ED EKG 12-Lead  . ED EKG 12-Lead  . EKG 12-Lead  . EKG 12-Lead      Management plans discussed with the patient, family and they are in agreement.  CODE STATUS:     Code Status Orders  (From admission, onward)        Start     Ordered   06/27/17 1642  Do not attempt resuscitation (DNR)  Continuous    Question Answer Comment  In the event of cardiac or respiratory ARREST Do not call a "code blue"   In the event of cardiac or respiratory ARREST Do not perform Intubation, CPR, defibrillation or ACLS   In the event of cardiac or respiratory ARREST Use medication by any route, position, wound care, and other measures to relive pain and suffering. May use oxygen, suction and manual treatment of airway obstruction as needed for  comfort.      06/27/17 1641    Code Status History    Date Active Date Inactive Code Status Order ID Comments User Context   06/27/2017 1547 06/27/2017 1641 DNR [id number]  Merlyn Lot, MD ED   05/17/2017 1347 05/26/2017 2115 DNR [id number]  Bettey Costa, MD Inpatient   05/05/2017 1547 05/07/2017 2121 DNR [id number]  Hillary Bow, MD ED   04/26/2017 1709 04/28/2017 2046 DNR [id number]  Demetrios Loll, MD Inpatient   01/03/2017 1328 01/08/2017 2014 DNR [id number]  Idelle Crouch, MD Inpatient   01/03/2017 1031 01/03/2017 1328 DNR [id number]  Merlyn Lot, MD ED   09/18/2015 1817 09/22/2015 1920 DNR [id number]  Fritzi Mandes, MD Inpatient   09/18/2015 1516 09/18/2015 1817 DNR [id number]  Flora Lipps, MD ED   03/22/2013 2100 03/26/2013 1540 Full Code [id number]  Rise Patience, MD Inpatient   11/20/2012 1756 12/06/2012 2008 DNR [id number]  Annita Brod, MD Inpatient   06/18/2011 1042 06/19/2011 1401 Full Code [id number]  Paulene Floor, RN Inpatient   12/14/2010 1124 12/29/2010 1543 DNR [id number]  Bonnielee Haff, MD Inpatient   12/13/2010 0113 12/14/2010 1123 Full Code [id number]  Sid Falcon, RN Inpatient   11/22/2010 0216 11/28/2010 2239 DNR [id number]  Viviano Simas, RN Inpatient    Advance Directive Documentation     Most Recent Value  Type of Advance Directive  Healthcare Power of Attorney, Living will  Pre-existing out of facility DNR order (yellow form or pink MOST form)  -  "MOST" Form in Place?  -      TOTAL TIME TAKING CARE OF THIS PATIENT: 45 minutes.    Avel Peace Chestina Komatsu M.D on 07/08/2017 at 10:00 AM  Between 7am to 6pm - Pager - [phone]  After 6pm go to [email or web address] - password [email or web address] [facility]  Office  [phone]  CC: Primary care physician; [name], MD   Note: This dictation was prepared with Dragon dictation along with smaller phrase technology. Any transcriptional errors that result from this process are  unintentional.

## 2017-07-08 NOTE — Progress Notes (Signed)
IV was removed. Discharge instructions were provided to the pt and son at bedside. All questions answered. The pt was taken downstairs via wheelchair by volunteers.

## 2017-07-08 NOTE — Care Management (Signed)
Patient to discharge to [facility] today.  Elvera Bicker dialysis liaison notified of discharge.  Corene Cornea with [facility] notified that patient would be discharging to SNF. Portable O2 to be picked up from room.  RNCM signing off.

## 2017-07-08 NOTE — Clinical Social Work Placement (Signed)
   CLINICAL SOCIAL WORK PLACEMENT  NOTE  Date:  07/08/2017  Patient Details  Name: [name] MRN: [id number] Date of Birth: Nov 09, 1920  Clinical Social Work is seeking post-discharge placement for this patient at the [place] level of care (*CSW will initial, date and re-position this form in  chart as items are completed):  Yes   Patient/family provided with [facility] Work Department's list of facilities offering this level of care within the geographic area requested by the patient (or if unable, by the patient's family).  Yes   Patient/family informed of their freedom to choose among providers that offer the needed level of care, that participate in Medicare, Medicaid or managed care program needed by the patient, have an available bed and are willing to accept the patient.  Yes   Patient/family informed of [facility]'s ownership interest in [facility] and [facility], as well as of the fact that they are under no obligation to receive care at these facilities.  PASRR submitted to EDS on       PASRR number received on       Existing PASRR number confirmed on 07/07/17     FL2 transmitted to all facilities in geographic area requested by pt/family on 07/07/17     FL2 transmitted to all facilities within larger geographic area on       Patient informed that his/her managed care company has contracts with or will negotiate with certain facilities, including the following:        Yes   Patient/family informed of bed offers received.  [facility] ([facility])     Physician recommends and patient chooses bed [facility])    [facility] to [facility] on 07[facility]08/17.  Patient to be transferred to facility by (son)     Patient family notified on 07/08/17 of transfer.  Name of family member notified:  (son: Maansi Wike)     PHYSICIAN       Additional Comment:    _______________________________________________ Shela Leff, LCSW 07/08/2017, 12:06 PM

## 2017-07-09 ENCOUNTER — Other Ambulatory Visit: Payer: Self-pay | Admitting: *Deleted

## 2017-07-09 DIAGNOSIS — Z515 Encounter for palliative care: Secondary | ICD-10-CM | POA: Diagnosis not present

## 2017-07-09 DIAGNOSIS — N186 End stage renal disease: Secondary | ICD-10-CM | POA: Diagnosis not present

## 2017-07-09 NOTE — Patient Outreach (Addendum)
[facility]) Care Management  07/09/2017  JELISA [place]oka Aug 29, 1920 [id number]   Post Acute Care Coordination-patient was re-admitted to [facility] on 07/08/17 for short term rehab. This social worker informed discharge Verdi of palliative care consult while in-patient.Per Queen Blossom, he will follow up with Palliative Care referral.  .[place], [place] Management [phone]

## 2017-07-10 DIAGNOSIS — N2581 Secondary hyperparathyroidism of renal origin: Secondary | ICD-10-CM | POA: Diagnosis not present

## 2017-07-10 DIAGNOSIS — N186 End stage renal disease: Secondary | ICD-10-CM | POA: Diagnosis not present

## 2017-07-12 DIAGNOSIS — N2581 Secondary hyperparathyroidism of renal origin: Secondary | ICD-10-CM | POA: Diagnosis not present

## 2017-07-12 DIAGNOSIS — N186 End stage renal disease: Secondary | ICD-10-CM | POA: Diagnosis not present

## 2017-07-13 DIAGNOSIS — I509 Heart failure, unspecified: Secondary | ICD-10-CM | POA: Diagnosis not present

## 2017-07-13 DIAGNOSIS — N186 End stage renal disease: Secondary | ICD-10-CM | POA: Diagnosis not present

## 2017-07-13 DIAGNOSIS — I10 Essential (primary) hypertension: Secondary | ICD-10-CM | POA: Diagnosis not present

## 2017-07-13 DIAGNOSIS — Z8719 Personal history of other diseases of the digestive system: Secondary | ICD-10-CM | POA: Diagnosis not present

## 2017-07-13 DIAGNOSIS — Z8781 Personal history of (healed) traumatic fracture: Secondary | ICD-10-CM | POA: Diagnosis not present

## 2017-07-14 DIAGNOSIS — N186 End stage renal disease: Secondary | ICD-10-CM | POA: Diagnosis not present

## 2017-07-14 DIAGNOSIS — N2581 Secondary hyperparathyroidism of renal origin: Secondary | ICD-10-CM | POA: Diagnosis not present

## 2017-07-16 DIAGNOSIS — N2581 Secondary hyperparathyroidism of renal origin: Secondary | ICD-10-CM | POA: Diagnosis not present

## 2017-07-16 DIAGNOSIS — N186 End stage renal disease: Secondary | ICD-10-CM | POA: Diagnosis not present

## 2017-07-19 ENCOUNTER — Other Ambulatory Visit: Payer: Self-pay | Admitting: *Deleted

## 2017-07-19 ENCOUNTER — Encounter: Payer: Self-pay | Admitting: *Deleted

## 2017-07-19 DIAGNOSIS — N186 End stage renal disease: Secondary | ICD-10-CM | POA: Diagnosis not present

## 2017-07-19 DIAGNOSIS — N2581 Secondary hyperparathyroidism of renal origin: Secondary | ICD-10-CM | POA: Diagnosis not present

## 2017-07-19 NOTE — Patient Outreach (Signed)
[facility]) Care Management  07/19/2017  [name] [birth year] [id number]   Post Acute Care Coordination. This Education officer, museum met with dischrge planner [name] to follow up on patient's progress in rehab. Patient was in dialysis when this social worker arrived. Per [name], patient's discharge plan has not been determined. The Palliative care nurse has plans to discuss long term plan with patient's son related to transitioning patient to hospice care.   Plan: This social worker will continue to follow patient's progress in therapy and the possibility of patient's son now considering hospice care.   [name] [facility] Care Management [phone]

## 2017-07-19 NOTE — Patient Outreach (Deleted)
[facility]) Care Management  07/19/2017  [name] Jul 01, 1920 [id number]   This social worker met with patient's spouse who sates that he is ready to take patient home, however wants to be sure that patient is strong enough to remain home. Patient's spouse agrees with the 30 day decrease in care through [facility]. Patient's spouse reports that he has been participating in the caregiver training but feels that he needs added support.   Plan: This social worker to continue to follow patient's progress in rehab.   [name] [facility] Care Management [phone]

## 2017-07-19 NOTE — Telephone Encounter (Signed)
This encounter was created in error - please disregard.

## 2017-07-21 ENCOUNTER — Other Ambulatory Visit: Payer: Self-pay | Admitting: *Deleted

## 2017-07-21 DIAGNOSIS — N186 End stage renal disease: Secondary | ICD-10-CM | POA: Diagnosis not present

## 2017-07-21 DIAGNOSIS — N2581 Secondary hyperparathyroidism of renal origin: Secondary | ICD-10-CM | POA: Diagnosis not present

## 2017-07-21 NOTE — Patient Outreach (Addendum)
[name] Care Management  07/21/2017  [name] [birth year] [id number]   [facility] acute coordination email received from [name], discharge planner from [facility] stating that patient is making progress, however there is no discharge date set yet. Per therapy, patient is walking 30 feet times 3. Palliative care has spoken to patient's son regarding transitioning patient to Hospice. However patient's son does not want to stop dialysis. He plans to transition her to the [facility] once a discharge date is set.   Plan: This Education officer, museum will close case as plan is for patient to transition to ALF following her discharge from [facility].   [name] [facility] Care Management [phone]

## 2017-07-22 ENCOUNTER — Ambulatory Visit: Payer: Medicare Other | Admitting: Family

## 2017-07-22 ENCOUNTER — Telehealth: Payer: Self-pay | Admitting: Family

## 2017-07-22 NOTE — Telephone Encounter (Signed)
Patient did not show for her Heart Failure Clinic appointment on 07/22/17. Will attempt to reschedule.

## 2017-07-23 DIAGNOSIS — Z8719 Personal history of other diseases of the digestive system: Secondary | ICD-10-CM | POA: Diagnosis not present

## 2017-07-23 DIAGNOSIS — I509 Heart failure, unspecified: Secondary | ICD-10-CM | POA: Diagnosis not present

## 2017-07-23 DIAGNOSIS — Z8781 Personal history of (healed) traumatic fracture: Secondary | ICD-10-CM | POA: Diagnosis not present

## 2017-07-23 DIAGNOSIS — I10 Essential (primary) hypertension: Secondary | ICD-10-CM | POA: Diagnosis not present

## 2017-07-23 DIAGNOSIS — N186 End stage renal disease: Secondary | ICD-10-CM | POA: Diagnosis not present

## 2017-07-23 DIAGNOSIS — N2581 Secondary hyperparathyroidism of renal origin: Secondary | ICD-10-CM | POA: Diagnosis not present

## 2017-07-26 DIAGNOSIS — N186 End stage renal disease: Secondary | ICD-10-CM | POA: Diagnosis not present

## 2017-07-26 DIAGNOSIS — N2581 Secondary hyperparathyroidism of renal origin: Secondary | ICD-10-CM | POA: Diagnosis not present

## 2017-07-28 ENCOUNTER — Other Ambulatory Visit: Payer: Self-pay | Admitting: *Deleted

## 2017-07-28 DIAGNOSIS — N186 End stage renal disease: Secondary | ICD-10-CM | POA: Diagnosis not present

## 2017-07-28 DIAGNOSIS — N2581 Secondary hyperparathyroidism of renal origin: Secondary | ICD-10-CM | POA: Diagnosis not present

## 2017-07-28 NOTE — Patient Outreach (Signed)
[facility]) Care Management  07/28/2017  AUBRIANNE MOLYNEUX 1920/12/13 [id number]   Return call from patient's son Talayia Hjort who confirms that patient will be discharging to a ALF however he has not been informed of a discharge date yet. Per patient's son, patient will have to go to a ALF post discharge from rehab as he made an attempt to bring her home over the weekend and the act of getting up the stairs and into the home was extremely taxing for patient. Per patient's son, he had to give up her bed at the [facility] but is hopeful that when she is ready for discharge they will have a bed there or [place].   Plan: This Education officer, museum will continue to follow patients progress in therapy and will collaborate with the discharge planner regarding patient's long term plan.  [name] [facility] Care Management [phone]

## 2017-07-28 NOTE — Patient Outreach (Signed)
[facility]) Care Management  07/28/2017  [name] 1920-03-06 [id number]   Phone call to patient's son to discuss patient's progress in rehab and to confirm discharge plan to the [facility].    Voicemail message left for a return call.   [name] [facility] Care Management [phone]

## 2017-07-30 ENCOUNTER — Ambulatory Visit: Payer: Medicare Other | Admitting: *Deleted

## 2017-07-30 DIAGNOSIS — N186 End stage renal disease: Secondary | ICD-10-CM | POA: Diagnosis not present

## 2017-07-30 DIAGNOSIS — N2581 Secondary hyperparathyroidism of renal origin: Secondary | ICD-10-CM | POA: Diagnosis not present

## 2017-08-02 DIAGNOSIS — N2581 Secondary hyperparathyroidism of renal origin: Secondary | ICD-10-CM | POA: Diagnosis not present

## 2017-08-02 DIAGNOSIS — N186 End stage renal disease: Secondary | ICD-10-CM | POA: Diagnosis not present

## 2017-08-03 DIAGNOSIS — N186 End stage renal disease: Secondary | ICD-10-CM | POA: Diagnosis not present

## 2017-08-03 DIAGNOSIS — D649 Anemia, unspecified: Secondary | ICD-10-CM | POA: Diagnosis not present

## 2017-08-03 DIAGNOSIS — Z8781 Personal history of (healed) traumatic fracture: Secondary | ICD-10-CM | POA: Diagnosis not present

## 2017-08-03 DIAGNOSIS — Z8719 Personal history of other diseases of the digestive system: Secondary | ICD-10-CM | POA: Diagnosis not present

## 2017-08-03 DIAGNOSIS — I509 Heart failure, unspecified: Secondary | ICD-10-CM | POA: Diagnosis not present

## 2017-08-04 ENCOUNTER — Ambulatory Visit: Payer: Self-pay | Admitting: *Deleted

## 2017-08-04 DIAGNOSIS — N186 End stage renal disease: Secondary | ICD-10-CM | POA: Diagnosis not present

## 2017-08-04 DIAGNOSIS — Z992 Dependence on renal dialysis: Secondary | ICD-10-CM | POA: Diagnosis not present

## 2017-08-04 DIAGNOSIS — N2581 Secondary hyperparathyroidism of renal origin: Secondary | ICD-10-CM | POA: Diagnosis not present

## 2017-08-04 DIAGNOSIS — I120 Hypertensive chronic kidney disease with stage 5 chronic kidney disease or end stage renal disease: Secondary | ICD-10-CM | POA: Diagnosis not present

## 2017-08-05 ENCOUNTER — Other Ambulatory Visit: Payer: Self-pay | Admitting: *Deleted

## 2017-08-05 NOTE — Patient Outreach (Addendum)
[facility]) Care Management  08/05/2017  GEORGIA DELSIGNORE 1920/05/06 [id number]   Post Acute Care coordination visit. This Education officer, museum met briefly with patient who was in bed sleeping. Patient stated that she was in rehab to receive help and was not sure of her discharge plan. Patient appeared confused today, less alert today stating that "she just didn't know".   This Education officer, museum spoke with Sports coach, who stated that patient's son continues with plan for patient to discharge to ALF. Patient's son continues to decline hospice care. This Education officer, museum explained that per patient's son he had to give up patient's bed at the  [facility] and he is now considering [facility]. Per Raquel Sarna, patient's son would have to contact [facility] if interested and the facility would  contact them for any needed paperwork.  Spoke with patient's son, who states that  patient is being  treated for a UTI which may explain some of her confusion, This Education officer, museum explained that he woud have to contact [facility] for bed availabilities and that they would in turn contact [facility] for additional information if needed.    Plan: This Education officer, museum to follow up within 2 weeks to confirm discharge plan.   [name] [facility] Care Management [phone]

## 2017-08-06 ENCOUNTER — Emergency Department
Admission: EM | Admit: 2017-08-06 | Discharge: 2017-08-07 | Disposition: A | Discharge status: Rehabilitation Facility | Payer: Medicare Other | Attending: Student in an Organized Health Care Education/Training Program | Admitting: Student in an Organized Health Care Education/Training Program

## 2017-08-06 ENCOUNTER — Emergency Department: Payer: Medicare Other

## 2017-08-06 DIAGNOSIS — Z794 Long term (current) use of insulin: Secondary | ICD-10-CM | POA: Diagnosis not present

## 2017-08-06 DIAGNOSIS — D631 Anemia in chronic kidney disease: Secondary | ICD-10-CM | POA: Diagnosis not present

## 2017-08-06 DIAGNOSIS — R55 Syncope and collapse: Secondary | ICD-10-CM | POA: Diagnosis not present

## 2017-08-06 DIAGNOSIS — I132 Hypertensive heart and chronic kidney disease with heart failure and with stage 5 chronic kidney disease, or end stage renal disease: Secondary | ICD-10-CM | POA: Insufficient documentation

## 2017-08-06 DIAGNOSIS — N2581 Secondary hyperparathyroidism of renal origin: Secondary | ICD-10-CM | POA: Diagnosis not present

## 2017-08-06 DIAGNOSIS — N3000 Acute cystitis without hematuria: Secondary | ICD-10-CM | POA: Diagnosis not present

## 2017-08-06 DIAGNOSIS — J9811 Atelectasis: Secondary | ICD-10-CM | POA: Diagnosis not present

## 2017-08-06 DIAGNOSIS — N186 End stage renal disease: Secondary | ICD-10-CM | POA: Insufficient documentation

## 2017-08-06 DIAGNOSIS — E039 Hypothyroidism, unspecified: Secondary | ICD-10-CM | POA: Diagnosis not present

## 2017-08-06 DIAGNOSIS — Z79899 Other long term (current) drug therapy: Secondary | ICD-10-CM | POA: Insufficient documentation

## 2017-08-06 DIAGNOSIS — Z992 Dependence on renal dialysis: Secondary | ICD-10-CM | POA: Insufficient documentation

## 2017-08-06 DIAGNOSIS — I5032 Chronic diastolic (congestive) heart failure: Secondary | ICD-10-CM | POA: Diagnosis not present

## 2017-08-06 DIAGNOSIS — E1122 Type 2 diabetes mellitus with diabetic chronic kidney disease: Secondary | ICD-10-CM | POA: Insufficient documentation

## 2017-08-06 DIAGNOSIS — R531 Weakness: Secondary | ICD-10-CM | POA: Insufficient documentation

## 2017-08-06 DIAGNOSIS — Z87891 Personal history of nicotine dependence: Secondary | ICD-10-CM | POA: Insufficient documentation

## 2017-08-06 LAB — CBC
HCT: 33.2 % — ABNORMAL LOW (ref 35.0–47.0)
Hemoglobin: 11.6 g/dL — ABNORMAL LOW (ref 12.0–16.0)
MCH: 32.6 pg (ref 26.0–34.0)
MCHC: 34.8 g/dL (ref 32.0–36.0)
MCV: 93.7 fL (ref 80.0–100.0)
Platelets: 126 10*3/uL — ABNORMAL LOW (ref 150–440)
RBC: 3.55 MIL/uL — ABNORMAL LOW (ref 3.80–5.20)
RDW: 18.5 % — ABNORMAL HIGH (ref 11.5–14.5)
WBC: 8.5 10*3/uL (ref 3.6–11.0)

## 2017-08-06 LAB — BASIC METABOLIC PANEL
Anion gap: 11 (ref 5–15)
BUN: 12 mg/dL (ref 8–23)
CALCIUM: 8.3 mg/dL — AB (ref 8.9–10.3)
CO2: 29 mmol/L (ref 22–32)
Chloride: 95 mmol/L — ABNORMAL LOW (ref 98–111)
Creatinine, Ser: 1.79 mg/dL — ABNORMAL HIGH (ref 0.44–1.00)
GFR calc Af Amer: 27 mL/min — ABNORMAL LOW (ref >60)
GFR, EST NON AFRICAN AMERICAN: 23 mL/min — AB (ref >60)
GLUCOSE: 100 mg/dL — AB (ref 70–99)
Potassium: 3.3 mmol/L — ABNORMAL LOW (ref 3.5–5.1)
SODIUM: 135 mmol/L (ref 135–145)

## 2017-08-06 LAB — URINALYSIS, COMPLETE (UACMP) WITH MICROSCOPIC
BILIRUBIN URINE: NEGATIVE
GLUCOSE, UA: NEGATIVE mg/dL
KETONES UR: NEGATIVE mg/dL
Nitrite: NEGATIVE
Protein, ur: 100 mg/dL — AB
RBC / HPF: >50 RBC/hpf (ref 0–5)
Specific Gravity, Urine: 1.011 (ref 1.005–1.030)
pH: 6 (ref 5.0–8.0)

## 2017-08-06 LAB — TROPONIN I: Troponin I: 0.03 ng/mL (ref <0.03)

## 2017-08-06 MED ORDER — LIDOCAINE HCL (PF) 1 % IJ SOLN
5.0000 mL | Freq: Once | INTRAMUSCULAR | Status: AC
  Administered 2017-08-06: 5 mL via INTRADERMAL
  Filled 2017-08-06: qty 5

## 2017-08-06 MED ORDER — CEFTRIAXONE SODIUM 1 G IJ SOLR: 1.0000 g | Freq: Once | INTRAMUSCULAR | Status: DC

## 2017-08-06 MED ORDER — CEPHALEXIN 500 MG PO CAPS: 500.0000 mg | ORAL_CAPSULE | Freq: Two times a day (BID) | ORAL | 0 refills | Status: AC

## 2017-08-06 MED ORDER — CEFTRIAXONE SODIUM 1 G IJ SOLR
1.0000 g | Freq: Once | INTRAMUSCULAR | Status: AC
  Administered 2017-08-06: 1 g via INTRAMUSCULAR
  Filled 2017-08-06: qty 10

## 2017-08-06 NOTE — ED Triage Notes (Signed)
Pt arrives today via [facility] from [facility]  - pt completed her HD today with 1600 off  Pt experienced a syncopal episode while still sitting in the dialysis chair (witnessed by dialysis staff)  Pt did not fall  AV fistula is accessed upon arrival with clotted blood within tubing

## 2017-08-06 NOTE — ED Provider Notes (Signed)
[facility] Emergency Department Provider Note    First MD Initiated Contact with Patient 08/06/17 1612     (approximate)  I have reviewed the triage vital signs and the nursing notes.   HISTORY  Chief Complaint Near Syncope and Weakness    HPI ZAHNIYA ZELLARS is a 84 y.o. female with extensive medical history of end-stage renal disease on dialysis was chronic congestive heart failure severe aortic stenosis diabetes presents to the ER from dialysis center after having a syncopal event while laying in the chair after completing her dialysis session.  Patient very hard of hearing but denies any discomfort.  Patient seems disoriented as to where she is.  Does not remember coming to the ER.  Denies any chest pain or shortness of breath at this time.  On review of medical records does appear the patient had significant decline in her clinical status over the past several months developing failure to thrive secondary protein calorie malnutrition with consultation with palliative care specialist but patient and family were not ready for hospice.    Past Medical History:  Diagnosis Date  . Anemia    NOS  . Anxiety   . Aortic stenosis   . Cancer [facility])    Mole on top of head to be removed in July 2013  . Chronic diastolic CHF (congestive heart failure) ([place])    a. 01/2017 Echo: EF 60-65%, no rwma, Gr2 DD, mild to mod MR, mildly dil LA/RA, nl RV fxn, Sev TR, PASP 57mmHg.  . Constipation   . Coronary artery disease    a. 2009 CABG x 3 w/ bioprosthetic AVR.  . Diabetes mellitus    type II;was on Glipizide but has been off x 50mon  . Dialysis patient ([place])   . Diarrhea   . ESRD (end stage renal disease) ([place])   . GERD (gastroesophageal reflux disease)   . History of blood transfusion   . Hyperlipidemia    takes Simvastatin nightly  . Hypertension    takes Metoprolol daily  . Hypothyroidism    takes Synthroid daily  . Migraine    hx of  . Oligouria   .  Osteoarthritis   . Osteopenia   . Peripheral vascular disease ([place])   . Personal history of colonic polyps   . Pulmonary hypertension ([place])   . Urinary incontinence   . UTI (urinary tract infection)    Family History  Family history unknown: Yes   Past Surgical History:  Procedure Laterality Date  . A/V FISTULAGRAM [name] 01/07/2017   Procedure: A/V Fistulagram;  Surgeon: Algernon Huxley, MD;  Location: [place] CV LAB;  Service: Cardiovascular;  Laterality: [name];  . A/V SHUNT INTERVENTION [name] 01/07/2017   Procedure: A/V SHUNT INTERVENTION;  Surgeon: Algernon Huxley, MD;  Location: [place] CV LAB;  Service: Cardiovascular;  Laterality: [name];  . ABDOMINAL HYSTERECTOMY    . adenosine myoview  2007   benign, EF 69%  . AMPUTATION  06/18/2011   Procedure: AMPUTATION DIGIT;  Surgeon: Newt Minion, MD;  Location: [place];  Service: Orthopedics;  Laterality: Left;  Left 2nd Toe Amputation at MTP Joint  . Amputation-left great toe  7/12   Dr Marlou Sa  . AORTIC VALVE REPLACEMENT  2009  . APPENDECTOMY    . AV FISTULA PLACEMENT  12/22/2010   Procedure: ARTERIOVENOUS (AV) FISTULA CREATION;  Surgeon: Hinda Lenis, MD;  Location: [place];  Service: Vascular;  Laterality: Left;  Creation of Left Brachial-Cephalic  Fistula  . CARDIAC CATHETERIZATION  2000   cad  . CAROTID ENDARTERECTOMY  2011   Right  . CATARACT EXTRACTION    . COLONOSCOPY  05/21/2017   Procedure: COLONOSCOPY;  Surgeon: Toledo, Benay Pike, MD;  Location: [facility] ENDOSCOPY;  Service: Gastroenterology;;  . CORONARY ARTERY BYPASS GRAFT     od  . CORONARY ARTERY BYPASS GRAFT  2009  . ESOPHAGOGASTRODUODENOSCOPY [name] 05/21/2017   Procedure: ESOPHAGOGASTRODUODENOSCOPY (EGD);  Surgeon: Toledo, Benay Pike, MD;  Location: [facility] END[facility]COPY;  Service: Gastroenterology;  Laterality: [name];  . EYE SURGERY     BIlateral  . INSERTION OF DIALYSIS CATHETER  12/18/2010   Procedure: INSERTION OF DIALYSIS CATHETER;  Surgeon: Mal Misty, MD;  Location: [place];   Service: Vascular;  Laterality: Right;  . REPLACEMENT TOTAL KNEE BILATERAL  05/1998  . TONSILLECTOMY    . Traumatic Amputation of Right DIP joint of Index Finger     Patient Active Problem List   Diagnosis Date Noted  . ESRD on hemodialysis ([place])   . Failure to thrive in adult   . Goals of care, counseling/discussion   . Palliative care by specialist   . Acute respiratory failure with hypoxia ([place]) 06/27/2017  . Pressure injury of skin 05/19/2017  . GIB (gastrointestinal bleeding) 05/17/2017  . GI bleed 05/05/2017  . Pelvic fracture ([place]) 04/26/2017  . Protein-calorie malnutrition, severe 01/07/2017  . CHF (congestive heart failure) ([place]) 01/03/2017  . UTI (urinary tract infection) 01/03/2017  . Acute metabolic encephalopathy 62/56/3893  . Cerebrovascular accident (CVA) due to occlusion of right middle cerebral artery ([place]) 10/24/2015  . Malnutrition of mild degree ([place]) 10/24/2015  . Altered mental status 09/18/2015  . Preventative health care 05/03/2014  . Peripheral vascular disease ([place]) 12/21/2013  . Toe amputation status ([place]) 08/29/2013  . History of recent fall 04/18/2013  . HTN (hypertension), malignant 03/22/2013  . S/P aortic valve replacement with bioprosthetic valve 11/15/2012  . S/P CABG x 3 11/15/2012  . Anemia of chronic kidney failure 12/22/2010  . Thrombocytopenia ([place]) 12/13/2010  . End stage renal disease on dialysis ([place]) 12/13/2010  . S/P aortic valve replacement 08/11/2010  . Arterial insufficiency-lower 07/25/2010  . NEURODERMATITIS 04/08/2009  . Carotid arterial disease ([place]) 01/01/2009  . Coronary atherosclerosis 02/11/2007  . AORTIC STENOSIS 10/04/2006  . ANXIETY 05/30/2006  . Pulmonary hypertension ([place]) 05/30/2006  . Hypothyroidism 05/27/2006  . Type 2 diabetes mellitus with renal manifestations, controlled ([place]) 05/27/2006  . Hyperlipidemia 05/27/2006  . Essential hypertension 05/27/2006  . GERD 05/27/2006  . OSTEOARTHRITIS 05/27/2006  .  OSTEOPENIA 05/27/2006  . URINARY INCONTINENCE 05/27/2006      Prior to Admission medications   Medication Sig Start Date End Date Taking? Authorizing Provider  acetaminophen (TYLENOL) 325 MG tablet Take 1 tablet (325 mg total) by mouth every 6 (six) hours as needed for mild pain or fever (or Fever >/= 101). 04/28/17   Gouru, Aruna, MD  albuterol (PROVENTIL) (2.5 MG/3ML) 0.083% nebulizer solution Take 3 mLs (2.5 mg total) by nebulization every 2 (two) hours as needed for wheezing. Patient not taking: Reported on 06/27/2017 04/28/17   Nicholes Mango, MD  Amino Acids-Protein Hydrolys (FEEDING SUPPLEMENT, PRO-STAT SUGAR FREE 64,) LIQD Take 30 mLs by mouth 3 (three) times daily with meals.    [provider]  amLODipine (NORVASC) 10 MG tablet Take 1 tablet (10 mg total) by mouth daily. 05/27/17   Gouru, Illene Silver, MD  calcium carbonate (TUMS - DOSED IN MG ELEMENTAL CALCIUM) 500 MG chewable  tablet Chew 1 tablet (200 mg of elemental calcium total) by mouth 3 (three) times daily with meals. 12/06/12   Geradine Girt, DO  cephALEXin (KEFLEX) 500 MG capsule Take 1 capsule (500 mg total) by mouth 2 (two) times daily for 7 days. 08/06/17 08/13/17  Merlyn Lot, MD  Chlorhexidine Gluconate Cloth 2 % PADS Apply 6 each topically daily at 6 (six) AM. 07/09/17   Salary, Avel Peace, MD  divalproex (DEPAKOTE) 125 MG DR tablet Take 5 tablets (625 mg total) by mouth 2 (two) times daily. 07/08/17 08/07/17  Salary, Holly Bodily D, MD  epoetin alfa (EPOGEN,PROCRIT) 44034 UNIT/ML injection Inject 1 mL (10,000 Units total) into the vein every Monday, Wednesday, and Friday with hemodialysis. Patient not taking: Reported on 06/27/2017 05/24/17   Nicholes Mango, MD  feeding supplement, ENSURE ENLIVE, (ENSURE ENLIVE) LIQD Take 237 mLs by mouth 3 (three) times daily between meals. Patient not taking: Reported on 06/27/2017 01/08/17   Fritzi Mandes, MD  folic acid (FOLVITE) 0.5 MG tablet Take 0.5 tablets (0.5 mg total) by mouth daily. 12/06/12    Geradine Girt, DO  Ganciclovir (ZIRGAN) 0.15 % GEL Place 1 drop into the right eye 4 (four) times daily. 01/08/17   Fritzi Mandes, MD  hydrALAZINE (APRESOLINE) 25 MG tablet Take 25 mg by mouth 4 (four) times daily.     [provider]  insulin aspart (NOVOLOG) 100 UNIT/ML injection Inject 0-9 Units into the skin 3 (three) times daily with meals. CBG < 70: implement hypoglycemia protocol CBG 70 - 120: 0 units CBG 121 - 150: 1 unit CBG 151 - 200: 2 units CBG 201 - 250: 3 units CBG 251 - 300: 5 units CBG 301 - 350: 7 units CBG 351 - 400: 9 units CBG > 400: call MD and obtain STAT lab verification 05/24/17   Gouru, Illene Silver, MD  ipratropium-albuterol (DUONEB) 0.5-2.5 (3) MG/3ML SOLN Take 3 mLs by nebulization every 6 (six) hours as needed. 07/08/17   Salary, Avel Peace, MD  levothyroxine (SYNTHROID, LEVOTHROID) 75 MCG tablet TAKE ONE TABLET BY MOUTH ONCE DAILY 07/07/16   Viviana Simpler I, MD  metoprolol tartrate (LOPRESSOR) 50 MG tablet TAKE ONE TABLET BY MOUTH TWICE DAILY Patient taking differently: Take 75 mg by mouth twice daily. 02/05/17   Minna Merritts, MD  Multiple Vitamins-Minerals (CEROVITE SENIOR) TABS Take 1 tablet by mouth daily.    [provider]  multivitamin (RENA-VIT) TABS tablet Take 1 tablet by mouth at bedtime. Patient not taking: Reported on 06/27/2017 01/08/17   Fritzi Mandes, MD  ondansetron (ZOFRAN) 4 MG tablet Take 1 tablet (4 mg total) by mouth every 6 (six) hours as needed for nausea. 04/28/17   Nicholes Mango, MD  pantoprazole (PROTONIX) 40 MG tablet Take 1 tablet (40 mg total) by mouth 2 (two) times daily before a meal. 05/24/17   Gouru, Aruna, MD  polyethylene glycol (MIRALAX / GLYCOLAX) packet Take 17 g by mouth daily as needed for mild constipation. Patient taking differently: Take 17 g by mouth daily.  05/24/17   Nicholes Mango, MD  polyvinyl alcohol (LIQUIFILM TEARS) 1.4 % ophthalmic solution Place 1 drop into both eyes as needed for dry eyes. 09/22/15   Fritzi Mandes,  MD  pravastatin (PRAVACHOL) 40 MG tablet TAKE ONE TABLET BY MOUTH IN THE EVENING Patient taking differently: TAKE ONE TABLET BY MOUTH DAILY 07/21/16   Minna Merritts, MD  senna-docusate (SENOKOT-S) 8.6-50 MG tablet Take 1 tablet by mouth at bedtime as needed for  mild constipation. 04/28/17   Nicholes Mango, MD  vitamin C (VITAMIN C) 500 MG tablet Take 1 tablet (500 mg total) by mouth daily. Patient taking differently: Take 500 mg by mouth 2 (two) times daily.  12/06/12   Geradine Girt, DO  zinc sulfate 220 (50 Zn) MG capsule Take 220 mg by mouth daily.    [provider]    Allergies Nitrofurantoin; Ace inhibitors; Captopril; Enalapril maleate; Ramipril; Sulfa antibiotics; and Verapamil    Social History Social History   Tobacco Use  . Smoking status: Former Smoker    Years: 15.00    Types: Cigarettes    Last attempt to quit: 01/06/1943    Years since quitting: 74.6  . Smokeless tobacco: Never Used  Substance Use Topics  . Alcohol use: Yes    Comment: Wine occasionally  . Drug use: No    Review of Systems Patient denies headaches, rhinorrhea, blurry vision, numbness, shortness of breath, chest pain, edema, cough, abdominal pain, nausea, vomiting, diarrhea, dysuria, fevers, rashes or hallucinations unless otherwise stated above in HPI. ____________________________________________   PHYSICAL EXAM:  VITAL SIGNS: Vitals:   08/06/17 2200 08/06/17 2215  BP:  (!) 169/52  Pulse:  84  Resp: 13   Temp:    SpO2:  96%    Constitutional: frail, chronically ill appearing, conversant Eyes: Conjunctivae are normal.  Head: Atraumatic. Nose: No congestion/rhinnorhea. Mouth/Throat: Mucous membranes are moist.   Neck: No stridor. Painless ROM.  Cardiovascular: Normal rate, regular rhythm. Holosystolic murmur.  Good peripheral circulation.  Left av fistula Respiratory: Normal respiratory effort.  No retractions. Lungs with bibasilar inspiratory crackles Gastrointestinal: Soft  and nontender. No distention. No abdominal bruits. No CVA tenderness. Genitourinary: deferred Musculoskeletal: No lower extremity tenderness nor edema.  No joint effusions. Neurologic: No gross focal neurologic deficits are appreciated. No facial droop Skin:  Skin is warm, dry and intact. No rash noted.  ____________________________________________   LABS (all labs ordered are listed, but only abnormal results are displayed)  Results for orders placed or performed during the hospital encounter of 08/06/17 (from the past 24 hour(s))  Basic metabolic panel     Status: Abnormal   Collection Time: 08/06/17  4:07 PM  Result Value Ref Range   Sodium 135 135 - 145 mmol/L   Potassium 3.3 (L) 3.5 - 5.1 mmol/L   Chloride 95 (L) 98 - 111 mmol/L   CO2 29 22 - 32 mmol/L   Glucose, Bld 100 (H) 70 - 99 mg/dL   BUN 12 8 - 23 mg/dL   Creatinine, Ser 1.79 (H) 0.44 - 1.00 mg/dL   Calcium 8.3 (L) 8.9 - 10.3 mg/dL   GFR calc non Af Amer 23 (L) >60 mL/min   GFR calc Af Amer 27 (L) >60 mL/min   Anion gap 11 5 - 15  CBC     Status: Abnormal   Collection Time: 08/06/17  4:07 PM  Result Value Ref Range   WBC 8.5 3.6 - 11.0 K/uL   RBC 3.55 (L) 3.80 - 5.20 MIL/uL   Hemoglobin 11.6 (L) 12.0 - 16.0 g/dL   HCT 33.2 (L) 35.0 - 47.0 %   MCV 93.7 80.0 - 100.0 fL   MCH 32.6 26.0 - 34.0 pg   MCHC 34.8 32.0 - 36.0 g/dL   RDW 18.5 (H) 11.5 - 14.5 %   Platelets 126 (L) 150 - 440 K/uL  Troponin I     Status: Abnormal   Collection Time: 08/06/17  4:07 PM  Result Value Ref Range   Troponin I 0.03 (HH) <0.03 ng/mL  Urinalysis, Complete w Microscopic     Status: Abnormal   Collection Time: 08/06/17  9:03 PM  Result Value Ref Range   Color, Urine YELLOW (A) YELLOW   APPearance TURBID (A) CLEAR   Specific Gravity, Urine 1.011 1.005 - 1.030   pH 6.0 5.0 - 8.0   Glucose, UA NEGATIVE NEGATIVE mg/dL   Hgb urine dipstick MODERATE (A) NEGATIVE   Bilirubin Urine NEGATIVE NEGATIVE   Ketones, ur NEGATIVE NEGATIVE mg/dL    Protein, ur 100 (A) NEGATIVE mg/dL   Nitrite NEGATIVE NEGATIVE   Leukocytes, UA MODERATE (A) NEGATIVE   Squamous Epithelial / LPF 11-20 0 - 5   WBC, UA >50 0 - 5 WBC/hpf   RBC / HPF >50 0 - 5 RBC/hpf   Bacteria, UA MANY (A) NONE SEEN   Mucus PRESENT    ____________________________________________  EKG My review and personal interpretation at Time: 16:15   Indication: sncope  Rate: 90  Rhythm: sinus with first degree av block, Axis: left Other: lbbb, no stemi criteria, otherwise normal intervals ____________________________________________  RADIOLOGY  I personally reviewed all radiographic images ordered to evaluate for the above acute complaints and reviewed radiology reports and findings.  These findings were personally discussed with the patient.  Please see medical record for radiology report.  ____________________________________________   PROCEDURES  Procedure(s) performed:  Procedures    Critical Care performed: no ____________________________________________   INITIAL IMPRESSION / ASSESSMENT AND PLAN / ED COURSE  Pertinent labs & imaging results that were available during my care of the patient were reviewed by me and considered in my medical decision making (see chart for details).   DDX: Dehydration, sepsis, pna, uti, hypoglycemia, cva, drug effect, withdrawal,    ALEEN MARSTON is a 84 y.o. who presents to the ED with history as described above.  She is afebrile [place] dynamically stable.  Blood work will be sent for the above differential.  We will try to give the differential history from family once they arrive.  The patient will be placed on continuous pulse oximetry and telemetry for monitoring.  Laboratory evaluation will be sent to evaluate for the above complaints.     Clinical Course as of Aug 07 2318  Fri Aug 06, 2017  1758 Patient reassessed.  Still no family at bedside.   [PR]  1926 Patient reassessed.  Family is at bedside.  States that she  appears roughly at baseline.  A little bit more weak and frail than usual.  States she was diagnosed with UTI yesterday.   [PR]  2210 She does have evidence of UTI.  After lengthy discussion with the patient and son regarding options of care.  Did offer admission the hospital for additional symptomatic management with IV antibiotics.  Son states he does not want IV medication given would prefer the patient be taken back to [facility] where she is familiar with her surroundings.  She does not have any signs or symptoms of sepsis.  No hypoxia even on room air.  Will give IM Rocephin send urine cultures and start Keflex.  Patient conversant.  Oriented to place and states she denies any pain.  States that she would like to go back to [facility].  I think that this is appropriate.   [PR]    Clinical Course User Index [PR] Merlyn Lot, MD     As part of my medical decision making, I reviewed  the following data within the [place] notes reviewed and incorporated, Labs reviewed, notes from prior ED visits and [place] Controlled Substance Database   ____________________________________________   FINAL CLINICAL IMPRESSION(S) / ED DIAGNOSES  Final diagnoses:  Near syncope  Acute cystitis without hematuria      NEW MEDICATIONS STARTED DURING THIS VISIT:  New Prescriptions   CEPHALEXIN (KEFLEX) 500 MG CAPSULE    Take 1 capsule (500 mg total) by mouth 2 (two) times daily for 7 days.     Note:  This document was prepared using Dragon voice recognition software and may include unintentional dictation errors.    Merlyn Lot, MD 08/06/17 2320

## 2017-08-06 NOTE — ED Notes (Signed)
Pt ate 1 chocolate ice cream and graham crackers with pb, drinking water as well. Son assisting pt

## 2017-08-06 NOTE — ED Notes (Signed)
This RN attempted to reach Donnie Aho as chart states. No answer, as RN will monitor, EDP will be made aware when family arrives.

## 2017-08-06 NOTE — ED Notes (Signed)
Patient is resting comfortably at this time with no signs of distress present. Equal, unlabored rise and fall of chest noted within normal rate. Son remains at bedside. Offered toileting, food and beverage. Will continue to monitor.   Attempt in and out catheter with another staff member.

## 2017-08-06 NOTE — ED Notes (Addendum)
Report to [place], pt primary nurse at [facility] who denies questions in regard to plan at d/c. Son to transport [facility] back to [facility] and nurse verbalizes understanding of that as well.   Pt. And son verbalize understanding of d/c instructions, medications, and follow-up. VS stable at baseline and pt denies pain.  Pt. In NAD at time of d/c and denies further concerns regarding this visit, stating she is ready to go back to [facility]. Pt/son advised to return to the ED at any time for emergent concerns, or for new/worsening symptoms.   Pt going home and then returning to take pt back to [facility]. ED Charge aware. Pt moved to hallway for close observation by secretary desk in case of needs, versus being in room behind closed door.

## 2017-08-07 DIAGNOSIS — J9620 Acute and chronic respiratory failure, unspecified whether with hypoxia or hypercapnia: Secondary | ICD-10-CM | POA: Diagnosis not present

## 2017-08-07 DIAGNOSIS — M6281 Muscle weakness (generalized): Secondary | ICD-10-CM | POA: Diagnosis not present

## 2017-08-07 DIAGNOSIS — E569 Vitamin deficiency, unspecified: Secondary | ICD-10-CM | POA: Diagnosis not present

## 2017-08-07 DIAGNOSIS — L89899 Pressure ulcer of other site, unspecified stage: Secondary | ICD-10-CM | POA: Diagnosis not present

## 2017-08-07 DIAGNOSIS — I10 Essential (primary) hypertension: Secondary | ICD-10-CM | POA: Diagnosis not present

## 2017-08-07 DIAGNOSIS — D631 Anemia in chronic kidney disease: Secondary | ICD-10-CM | POA: Diagnosis not present

## 2017-08-07 DIAGNOSIS — N2581 Secondary hyperparathyroidism of renal origin: Secondary | ICD-10-CM | POA: Diagnosis not present

## 2017-08-07 DIAGNOSIS — N3000 Acute cystitis without hematuria: Secondary | ICD-10-CM | POA: Diagnosis not present

## 2017-08-07 DIAGNOSIS — E039 Hypothyroidism, unspecified: Secondary | ICD-10-CM | POA: Diagnosis not present

## 2017-08-07 DIAGNOSIS — E785 Hyperlipidemia, unspecified: Secondary | ICD-10-CM | POA: Diagnosis not present

## 2017-08-07 DIAGNOSIS — E119 Type 2 diabetes mellitus without complications: Secondary | ICD-10-CM | POA: Diagnosis not present

## 2017-08-07 DIAGNOSIS — Z8781 Personal history of (healed) traumatic fracture: Secondary | ICD-10-CM | POA: Diagnosis not present

## 2017-08-07 DIAGNOSIS — N186 End stage renal disease: Secondary | ICD-10-CM | POA: Diagnosis not present

## 2017-08-07 DIAGNOSIS — I010 Acute rheumatic pericarditis: Secondary | ICD-10-CM | POA: Diagnosis not present

## 2017-08-07 DIAGNOSIS — I509 Heart failure, unspecified: Secondary | ICD-10-CM | POA: Diagnosis not present

## 2017-08-07 DIAGNOSIS — N390 Urinary tract infection, site not specified: Secondary | ICD-10-CM | POA: Diagnosis not present

## 2017-08-07 DIAGNOSIS — K219 Gastro-esophageal reflux disease without esophagitis: Secondary | ICD-10-CM | POA: Diagnosis not present

## 2017-08-07 DIAGNOSIS — K5713 Diverticulitis of small intestine without perforation or abscess with bleeding: Secondary | ICD-10-CM | POA: Diagnosis not present

## 2017-08-07 DIAGNOSIS — J9601 Acute respiratory failure with hypoxia: Secondary | ICD-10-CM | POA: Diagnosis not present

## 2017-08-07 DIAGNOSIS — Z8719 Personal history of other diseases of the digestive system: Secondary | ICD-10-CM | POA: Diagnosis not present

## 2017-08-07 DIAGNOSIS — I2510 Atherosclerotic heart disease of native coronary artery without angina pectoris: Secondary | ICD-10-CM | POA: Diagnosis not present

## 2017-08-07 NOTE — ED Notes (Signed)
Pt. Stable at the time of departure from the unit, departing unit by the safest and most appropriate manner per that pt condition and limitations with all belongings accounted for.

## 2017-08-08 DIAGNOSIS — I509 Heart failure, unspecified: Secondary | ICD-10-CM | POA: Diagnosis not present

## 2017-08-08 DIAGNOSIS — Z8719 Personal history of other diseases of the digestive system: Secondary | ICD-10-CM | POA: Diagnosis not present

## 2017-08-08 DIAGNOSIS — N186 End stage renal disease: Secondary | ICD-10-CM | POA: Diagnosis not present

## 2017-08-08 DIAGNOSIS — I10 Essential (primary) hypertension: Secondary | ICD-10-CM | POA: Diagnosis not present

## 2017-08-08 DIAGNOSIS — Z8781 Personal history of (healed) traumatic fracture: Secondary | ICD-10-CM | POA: Diagnosis not present

## 2017-08-09 DIAGNOSIS — N186 End stage renal disease: Secondary | ICD-10-CM | POA: Diagnosis not present

## 2017-08-09 DIAGNOSIS — D631 Anemia in chronic kidney disease: Secondary | ICD-10-CM | POA: Diagnosis not present

## 2017-08-09 DIAGNOSIS — N2581 Secondary hyperparathyroidism of renal origin: Secondary | ICD-10-CM | POA: Diagnosis not present

## 2017-08-11 DIAGNOSIS — N186 End stage renal disease: Secondary | ICD-10-CM | POA: Diagnosis not present

## 2017-08-11 DIAGNOSIS — N2581 Secondary hyperparathyroidism of renal origin: Secondary | ICD-10-CM | POA: Diagnosis not present

## 2017-08-11 DIAGNOSIS — D631 Anemia in chronic kidney disease: Secondary | ICD-10-CM | POA: Diagnosis not present

## 2017-08-13 ENCOUNTER — Other Ambulatory Visit: Payer: Self-pay | Admitting: *Deleted

## 2017-08-13 ENCOUNTER — Ambulatory Visit: Payer: Self-pay | Admitting: *Deleted

## 2017-08-13 DIAGNOSIS — N2581 Secondary hyperparathyroidism of renal origin: Secondary | ICD-10-CM | POA: Diagnosis not present

## 2017-08-13 DIAGNOSIS — D631 Anemia in chronic kidney disease: Secondary | ICD-10-CM | POA: Diagnosis not present

## 2017-08-13 DIAGNOSIS — N186 End stage renal disease: Secondary | ICD-10-CM | POA: Diagnosis not present

## 2017-08-13 NOTE — Patient Outreach (Signed)
[facility]) Care Management  08/13/2017  [name] [birth year] [id number]   Post Acute Care Coordination phone call from [facility], discharge planner at [facility]. Per Janett Billow  Patient's last covered day is 08/18/17 and the [facility]F has rescinded their bed offer. Patient's son is now requesting that she be reassessed next week. If patient is not accepted, patient's son will bring patient home.  Therapy is recommending that patient have 24 hour care. Patient's son is aware of this and is hopeful that she will qualify for ALF following  her  re-assessment.   Plan: This Education officer, museum will follow up with patient within 2 weeks. This social worker will notify coverage partner of potential discharge plan.   [name] [facility] Care Management [phone]

## 2017-08-13 NOTE — Patient Outreach (Signed)
[facility]) Care Management  08/13/2017  JRUE YAMBAO [birth year] [id number]   Post Acute Care Coordination phone call to the discharge planner at [facility] to confirm patient's long term plan following her rehab stay. Voicemail message left for a return call.   [name] [facility] Care Management [phone]

## 2017-08-16 DIAGNOSIS — N2581 Secondary hyperparathyroidism of renal origin: Secondary | ICD-10-CM | POA: Diagnosis not present

## 2017-08-16 DIAGNOSIS — N186 End stage renal disease: Secondary | ICD-10-CM | POA: Diagnosis not present

## 2017-08-16 DIAGNOSIS — D631 Anemia in chronic kidney disease: Secondary | ICD-10-CM | POA: Diagnosis not present

## 2017-08-18 ENCOUNTER — Other Ambulatory Visit: Payer: Self-pay | Admitting: Licensed Clinical Social Worker

## 2017-08-18 DIAGNOSIS — N2581 Secondary hyperparathyroidism of renal origin: Secondary | ICD-10-CM | POA: Diagnosis not present

## 2017-08-18 DIAGNOSIS — N186 End stage renal disease: Secondary | ICD-10-CM | POA: Diagnosis not present

## 2017-08-18 DIAGNOSIS — D631 Anemia in chronic kidney disease: Secondary | ICD-10-CM | POA: Diagnosis not present

## 2017-08-18 NOTE — Patient Outreach (Signed)
[facility]) Care Management  08/18/2017  [name] 01-07-1920 [id number]   [facility] CSW completed call to [facility] discharge planner [name] but was unable to reach her successfully in order to gain discharge updates on patient. This [facility] CSW is currently providing coverage for [place]. Patient's last covered day is Thursday, 08/19/17. She was evaluated for ALF at the [facility] but they denied her. The son is asking that she be re-evaluated and this was suppose to take place this week. [facility] is recommending that patient have 24 hour care. The son is hopeful that the ALF accepts her.  [facility] CSW will await for return call from SNF discharge planner.  Eula Fried, BSW, MSW, [email or web address] Phone: [phone] Fax: [phone]

## 2017-08-18 NOTE — Patient Outreach (Addendum)
[facility]) Care Management  08/18/2017  KLARISSA MCILVAIN 06/14/1920 [id number]  [facility] CSW received return call from [facility] discharge planner [name] and was informed that patient will be discharging from SNF tomorrow to ALF. [facility] CSW will complete case closure at this time now that patient has a safe and stable discharge plan. [facility] CSW will close patient's case on behalf of [place] and will update her as well.  Eula Fried, BSW, [email or web address], [email or web address] Phone: [phone] Fax: [phone]

## 2017-08-21 DIAGNOSIS — N2581 Secondary hyperparathyroidism of renal origin: Secondary | ICD-10-CM | POA: Diagnosis not present

## 2017-08-21 DIAGNOSIS — N186 End stage renal disease: Secondary | ICD-10-CM | POA: Diagnosis not present

## 2017-08-21 DIAGNOSIS — D631 Anemia in chronic kidney disease: Secondary | ICD-10-CM | POA: Diagnosis not present

## 2017-08-23 ENCOUNTER — Ambulatory Visit: Payer: Self-pay | Admitting: *Deleted

## 2017-08-23 DIAGNOSIS — N186 End stage renal disease: Secondary | ICD-10-CM | POA: Diagnosis not present

## 2017-08-23 DIAGNOSIS — N2581 Secondary hyperparathyroidism of renal origin: Secondary | ICD-10-CM | POA: Diagnosis not present

## 2017-08-23 DIAGNOSIS — D631 Anemia in chronic kidney disease: Secondary | ICD-10-CM | POA: Diagnosis not present

## 2017-08-25 DIAGNOSIS — N186 End stage renal disease: Secondary | ICD-10-CM | POA: Diagnosis not present

## 2017-08-25 DIAGNOSIS — N2581 Secondary hyperparathyroidism of renal origin: Secondary | ICD-10-CM | POA: Diagnosis not present

## 2017-08-25 DIAGNOSIS — D631 Anemia in chronic kidney disease: Secondary | ICD-10-CM | POA: Diagnosis not present

## 2017-08-27 DIAGNOSIS — N2581 Secondary hyperparathyroidism of renal origin: Secondary | ICD-10-CM | POA: Diagnosis not present

## 2017-08-27 DIAGNOSIS — N186 End stage renal disease: Secondary | ICD-10-CM | POA: Diagnosis not present

## 2017-08-27 DIAGNOSIS — D631 Anemia in chronic kidney disease: Secondary | ICD-10-CM | POA: Diagnosis not present

## 2017-08-28 ENCOUNTER — Emergency Department
Admission: EM | Admit: 2017-08-28 | Discharge: 2017-08-28 | Disposition: A | Discharge status: Home / Self Care | Payer: Medicare Other | Attending: Emergency Medicine | Admitting: Emergency Medicine

## 2017-08-28 ENCOUNTER — Emergency Department: Payer: Medicare Other

## 2017-08-28 ENCOUNTER — Other Ambulatory Visit: Payer: Self-pay

## 2017-08-28 DIAGNOSIS — Z7901 Long term (current) use of anticoagulants: Secondary | ICD-10-CM | POA: Insufficient documentation

## 2017-08-28 DIAGNOSIS — Z992 Dependence on renal dialysis: Secondary | ICD-10-CM | POA: Diagnosis not present

## 2017-08-28 DIAGNOSIS — N3001 Acute cystitis with hematuria: Secondary | ICD-10-CM | POA: Diagnosis not present

## 2017-08-28 DIAGNOSIS — Z952 Presence of prosthetic heart valve: Secondary | ICD-10-CM | POA: Insufficient documentation

## 2017-08-28 DIAGNOSIS — F0390 Unspecified dementia without behavioral disturbance: Secondary | ICD-10-CM | POA: Diagnosis not present

## 2017-08-28 DIAGNOSIS — Z043 Encounter for examination and observation following other accident: Secondary | ICD-10-CM | POA: Diagnosis present

## 2017-08-28 DIAGNOSIS — I2510 Atherosclerotic heart disease of native coronary artery without angina pectoris: Secondary | ICD-10-CM | POA: Insufficient documentation

## 2017-08-28 DIAGNOSIS — W19XXXA Unspecified fall, initial encounter: Secondary | ICD-10-CM

## 2017-08-28 DIAGNOSIS — Y92122 Bedroom in nursing home as the place of occurrence of the external cause: Secondary | ICD-10-CM | POA: Diagnosis not present

## 2017-08-28 DIAGNOSIS — I120 Hypertensive chronic kidney disease with stage 5 chronic kidney disease or end stage renal disease: Secondary | ICD-10-CM | POA: Insufficient documentation

## 2017-08-28 DIAGNOSIS — Z7401 Bed confinement status: Secondary | ICD-10-CM | POA: Diagnosis not present

## 2017-08-28 DIAGNOSIS — E1122 Type 2 diabetes mellitus with diabetic chronic kidney disease: Secondary | ICD-10-CM | POA: Insufficient documentation

## 2017-08-28 DIAGNOSIS — W06XXXA Fall from bed, initial encounter: Secondary | ICD-10-CM | POA: Insufficient documentation

## 2017-08-28 DIAGNOSIS — I444 Left anterior fascicular block: Secondary | ICD-10-CM | POA: Diagnosis not present

## 2017-08-28 DIAGNOSIS — Z79899 Other long term (current) drug therapy: Secondary | ICD-10-CM | POA: Diagnosis not present

## 2017-08-28 DIAGNOSIS — S0990XA Unspecified injury of head, initial encounter: Secondary | ICD-10-CM | POA: Diagnosis not present

## 2017-08-28 DIAGNOSIS — N186 End stage renal disease: Secondary | ICD-10-CM | POA: Insufficient documentation

## 2017-08-28 LAB — URINALYSIS, COMPLETE (UACMP) WITH MICROSCOPIC
Bacteria, UA: NONE SEEN
Bilirubin Urine: NEGATIVE
GLUCOSE, UA: NEGATIVE mg/dL
Ketones, ur: NEGATIVE mg/dL
NITRITE: NEGATIVE
PH: 7 (ref 5.0–8.0)
Protein, ur: 100 mg/dL — AB
Specific Gravity, Urine: 1.014 (ref 1.005–1.030)
Squamous Epithelial / LPF: NONE SEEN (ref 0–5)
WBC, UA: >50 WBC/hpf — ABNORMAL HIGH (ref 0–5)

## 2017-08-28 MED ORDER — CEPHALEXIN 500 MG PO CAPS
500.0000 mg | ORAL_CAPSULE | Freq: Once | ORAL | Status: AC
  Administered 2017-08-28: 500 mg via ORAL
  Filled 2017-08-28: qty 1

## 2017-08-28 MED ORDER — CEPHALEXIN 500 MG PO CAPS: 500.0000 mg | ORAL_CAPSULE | Freq: Three times a day (TID) | ORAL | 0 refills | Status: AC

## 2017-08-28 NOTE — Discharge Instructions (Addendum)

## 2017-08-28 NOTE — ED Triage Notes (Signed)
Report of pt found on the floor by staff at [facility]  Pt on 2lt o2 per hx  Hx of dementia Unable to answer questions   Pt is DNR Pt observed without acute distress .

## 2017-08-28 NOTE — ED Provider Notes (Signed)
[facility] Emergency Department Provider Note  ____________________________________________  Time seen: Approximately 8:04 AM  I have reviewed the triage vital signs and the nursing notes.   HISTORY  Chief Complaint Fall  Level 5 caveat:  Portions of the history and physical were unable to be obtained due to dementia   HPI [name] is a 84 y.o. female with history as listed below who presents for evaluation after falling off her bed at the [facility].  Patient was found on the floor this morning.  She has a history of dementia and does not remember what happened.  Patient denies headache, neck pain, back pain, extremity pain, chest pain, abdominal pain.  History is gathered mostly from her son was at bedside.  According to him [facility] the [facility] 3 days ago and has had a significant decline since moving there.  She was walking with assistance and a lot more alert than she has been for the last 24 hours.  He is trying to get physical therapy reinstated at the [facility] as this was helping patient significantly.  Patient has not had any vomiting or fever, she has had no respiratory distress.  Son reports that patient underwent dialysis yesterday for a full treatment.  In the past patient has had a UTI and son is worried that she might have one at this time as well.  Past Medical History:  Diagnosis Date  . Anemia    NOS  . Anxiety   . Aortic stenosis   . Cancer [facility])    Mole on top of head to be removed in July 2013  . Chronic diastolic CHF (congestive heart failure) ([place])    a. 01/2017 Echo: EF 60-65%, no rwma, Gr2 DD, mild to mod MR, mildly dil LA/RA, nl RV fxn, Sev TR, PASP 77mmHg.  . Constipation   . Coronary artery disease    a. 2009 CABG x 3 w/ bioprosthetic AVR.  . Diabetes mellitus    type II;was on Glipizide but has been off x 34mon  . Dialysis patient ([place])   . Diarrhea   . ESRD (end stage renal disease) ([place])    . GERD (gastroesophageal reflux disease)   . History of blood transfusion   . Hyperlipidemia    takes Simvastatin nightly  . Hypertension    takes Metoprolol daily  . Hypothyroidism    takes Synthroid daily  . Migraine    hx of  . Oligouria   . Osteoarthritis   . Osteopenia   . Peripheral vascular disease ([place])   . Personal history of colonic polyps   . Pulmonary hypertension ([place])   . Urinary incontinence   . UTI (urinary tract infection)     Patient Active Problem List   Diagnosis Date Noted  . ESRD on hemodialysis ([place])   . Failure to thrive in adult   . Goals of care, counseling/discussion   . Palliative care by specialist   . Acute respiratory failure with hypoxia ([place]) 06/27/2017  . Pressure injury of skin 05/19/2017  . GIB (gastrointestinal bleeding) 05/17/2017  . GI bleed 05/05/2017  . Pelvic fracture ([place]) 04/26/2017  . Protein-calorie malnutrition, severe 01/07/2017  . CHF (congestive heart failure) ([place]) 01/03/2017  . UTI (urinary tract infection) 01/03/2017  . Acute metabolic encephalopathy 94/76/5465  . Cerebrovascular accident (CVA) due to occlusion of right middle cerebral artery ([place]) 10/24/2015  . Malnutrition of mild degree ([place]) 10/24/2015  . Altered  mental status 09/18/2015  . Preventative health care 05/03/2014  . Peripheral vascular disease ([place]) 12/21/2013  . Toe amputation status ([place]) 08/29/2013  . History of recent fall 04/18/2013  . HTN (hypertension), malignant 03/22/2013  . S/P aortic valve replacement with bioprosthetic valve 11/15/2012  . S/P CABG x 3 11/15/2012  . Anemia of chronic kidney failure 12/22/2010  . Thrombocytopenia ([place]) 12/13/2010  . End stage renal disease on dialysis ([place]) 12/13/2010  . S/P aortic valve replacement 08/11/2010  . Arterial insufficiency-lower 07/25/2010  . NEURODERMATITIS 04/08/2009  . Carotid arterial disease ([place]) 01/01/2009  . Coronary atherosclerosis 02/11/2007  . AORTIC STENOSIS 10/04/2006  .  ANXIETY 05/30/2006  . Pulmonary hypertension ([place]) 05/30/2006  . Hypothyroidism 05/27/2006  . Type 2 diabetes mellitus with renal manifestations, controlled ([place]) 05/27/2006  . Hyperlipidemia 05/27/2006  . Essential hypertension 05/27/2006  . GERD 05/27/2006  . OSTEOARTHRITIS 05/27/2006  . OSTEOPENIA 05/27/2006  . URINARY INCONTINENCE 05/27/2006    Past Surgical History:  Procedure Laterality Date  . A/V FISTULAGRAM [name] 01/07/2017   Procedure: A/V Fistulagram;  Surgeon: Algernon Huxley, MD;  Location: [place] [facility];  Service: Cardiovascular;  Laterality: [name];  . A/V SHUNT INTERVENTION [name] 01/07/2017   Procedure: A/V SHUNT INTERVENTION;  Surgeon: Algernon Huxley, MD;  Location: [place] [facility];  Service: Cardiovascular;  Laterality: [name];  . ABDOMINAL HYSTERECTOMY    . adenosine myoview  2007   benign, EF 69%  . AMPUTATION  06/18/2011   Procedure: AMPUTATION DIGIT;  Surgeon: Newt Minion, MD;  Location: [facility];  Service: Orthopedics;  Laterality: Left;  Left 2nd Toe Amputation at MTP Joint  . Amputation-left great toe  7/12   Dr Marlou Sa  . AORTIC VALVE REPLACEMENT  2009  . APPENDECTOMY    . AV FISTULA PLACEMENT  12/22/2010   Procedure: ARTERIOVENOUS (AV) FISTULA CREATION;  Surgeon: Hinda Lenis, MD;  Location: [facility];  Service: Vascular;  Laterality: Left;  Creation of Left Brachial-Cephalic Fistula  . CARDIAC CATHETERIZATION  2000   cad  . CAROTID ENDARTERECTOMY  2011   Right  . CATARACT EXTRACTION    . COLONOSCOPY  05/21/2017   Procedure: COLONOSCOPY;  Surgeon: Toledo, Benay Pike, MD;  Location: [facility] END[facility];  Service: Gastroenterology;;  . CORONARY ARTERY BYPASS GRAFT     od  . CORONARY ARTERY BYPASS GRAFT  2009  . ESOPHAGOGASTRODUODENOSCOPY [name] 05/21/2017   Procedure: ESOPHAGOGASTRODUODENOSCOPY (EGD);  Surgeon: Toledo, Benay Pike, MD;  Location: [facility] ENDOS[facility];  Service: Gastroenterology;  Laterality: [name];  . EYE SURGERY     BIlateral  . INSERTION OF DIALYSIS CATHETER   12/18/2010   Procedure: INSERTION OF DIALYSIS CATHETER;  Surgeon: Mal Misty, MD;  Location: [facility];  Service: Vascular;  Laterality: Right;  . REPLACEMENT TOTAL KNEE BILATERAL  05/1998  . TONSILLECTOMY    . Traumatic Amputation of Right DIP joint of Index Finger      Prior to Admission medications   Medication Sig Start Date End Date Taking? Authorizing Provider  acetaminophen (TYLENOL) 325 MG tablet Take 1 tablet (325 mg total) by mouth every 6 (six) hours as needed for mild pain or fever (or Fever >/= 101). 04/28/17   Gouru, Aruna, MD  albuterol (PROVENTIL) (2.5 MG/3ML) 0.083% nebulizer solution Take 3 mLs (2.5 mg total) by nebulization every 2 (two) hours as needed for wheezing. Patient not taking: Reported on 06/27/2017 04/28/17   Nicholes Mango, MD  Amino Acids-Protein Hydrolys (FEEDING SUPPLEMENT, PRO-STAT SUGAR FREE 64,) LIQD  Take 30 mLs by mouth 3 (three) times daily with meals.    [provider]  amLODipine (NORVASC) 10 MG tablet Take 1 tablet (10 mg total) by mouth daily. 05/27/17   Nicholes Mango, MD  calcium carbonate (TUMS - DOSED IN MG ELEMENTAL CALCIUM) 500 MG chewable tablet Chew 1 tablet (200 mg of elemental calcium total) by mouth 3 (three) times daily with meals. 12/06/12   Geradine Girt, DO  cephALEXin (KEFLEX) 500 MG capsule Take 1 capsule (500 mg total) by mouth 3 (three) times daily for 7 days. 08/28/17 09/04/17  Rudene Re, MD  Chlorhexidine Gluconate Cloth 2 % PADS Apply 6 each topically daily at 6 (six) AM. 07/09/17   Salary, Avel Peace, MD  divalproex (DEPAKOTE) 125 MG DR tablet Take 5 tablets (625 mg total) by mouth 2 (two) times daily. 07/08/17 08/07/17  Salary, Holly Bodily D, MD  epoetin alfa (EPOGEN,PROCRIT) 01749 UNIT/ML injection Inject 1 mL (10,000 Units total) into the vein every Monday, Wednesday, and Friday with hemodialysis. Patient not taking: Reported on 06/27/2017 05/24/17   Nicholes Mango, MD  feeding supplement, ENSURE ENLIVE, (ENSURE ENLIVE) LIQD Take 237  mLs by mouth 3 (three) times daily between meals. Patient not taking: Reported on 06/27/2017 01/08/17   Fritzi Mandes, MD  folic acid (FOLVITE) 0.5 MG tablet Take 0.5 tablets (0.5 mg total) by mouth daily. 12/06/12   Geradine Girt, DO  Ganciclovir (ZIRGAN) 0.15 % GEL Place 1 drop into the right eye 4 (four) times daily. 01/08/17   Fritzi Mandes, MD  hydrALAZINE (APRESOLINE) 25 MG tablet Take 25 mg by mouth 4 (four) times daily.     [provider]  insulin aspart (NOVOLOG) 100 UNIT/ML injection Inject 0-9 Units into the skin 3 (three) times daily with meals. CBG < 70: implement hypoglycemia protocol CBG 70 - 120: 0 units CBG 121 - 150: 1 unit CBG 151 - 200: 2 units CBG 201 - 250: 3 units CBG 251 - 300: 5 units CBG 301 - 350: 7 units CBG 351 - 400: 9 units CBG > 400: call MD and obtain STAT lab verification 05/24/17   Gouru, Illene Silver, MD  ipratropium-albuterol (DUONEB) 0.5-2.5 (3) MG/3ML SOLN Take 3 mLs by nebulization every 6 (six) hours as needed. 07/08/17   Salary, Avel Peace, MD  levothyroxine (SYNTHROID, LEVOTHROID) 75 MCG tablet TAKE ONE TABLET BY MOUTH ONCE DAILY 07/07/16   Viviana Simpler I, MD  metoprolol tartrate (LOPRESSOR) 50 MG tablet TAKE ONE TABLET BY MOUTH TWICE DAILY Patient taking differently: Take 75 mg by mouth twice daily. 02/05/17   Minna Merritts, MD  Multiple Vitamins-Minerals (CEROVITE SENIOR) TABS Take 1 tablet by mouth daily.    [provider]  multivitamin (RENA-VIT) TABS tablet Take 1 tablet by mouth at bedtime. Patient not taking: Reported on 06/27/2017 01/08/17   Fritzi Mandes, MD  ondansetron (ZOFRAN) 4 MG tablet Take 1 tablet (4 mg total) by mouth every 6 (six) hours as needed for nausea. 04/28/17   Nicholes Mango, MD  pantoprazole (PROTONIX) 40 MG tablet Take 1 tablet (40 mg total) by mouth 2 (two) times daily before a meal. 05/24/17   Gouru, Aruna, MD  polyethylene glycol (MIRALAX / GLYCOLAX) packet Take 17 g by mouth daily as needed for mild constipation. Patient  taking differently: Take 17 g by mouth daily.  05/24/17   Nicholes Mango, MD  polyvinyl alcohol (LIQUIFILM TEARS) 1.4 % ophthalmic solution Place 1 drop into both eyes as needed for dry eyes. 09/22/15  Fritzi Mandes, MD  pravastatin (PRAVACHOL) 40 MG tablet TAKE ONE TABLET BY MOUTH IN THE EVENING Patient taking differently: TAKE ONE TABLET BY MOUTH DAILY 07/21/16   Minna Merritts, MD  senna-docusate (SENOKOT-S) 8.6-50 MG tablet Take 1 tablet by mouth at bedtime as needed for mild constipation. 04/28/17   Nicholes Mango, MD  vitamin C (VITAMIN C) 500 MG tablet Take 1 tablet (500 mg total) by mouth daily. Patient taking differently: Take 500 mg by mouth 2 (two) times daily.  12/06/12   Geradine Girt, DO  zinc sulfate 220 (50 Zn) MG capsule Take 220 mg by mouth daily.    [provider]    Allergies Nitrofurantoin; Ace inhibitors; Captopril; Enalapril maleate; Ramipril; Sulfa antibiotics; and Verapamil  Family History  Family history unknown: Yes    Social History Social History   Tobacco Use  . Smoking status: Former Smoker    Years: 15.00    Types: Cigarettes    Last attempt to quit: 01/06/1943    Years since quitting: 74.6  . Smokeless tobacco: Never Used  Substance Use Topics  . Alcohol use: Yes    Comment: Wine occasionally  . Drug use: No    Review of Systems  Constitutional: Negative for fever. ENT: Negative for facial injury or neck injury Cardiovascular: Negative for chest injury. Respiratory: Negative for shortness of breath. Gastrointestinal: Negative for abdominal pain or injury. Musculoskeletal: Negative for back injury, negative for arm or leg pain. Skin: Negative for laceration/abrasions. ____________________________________________   PHYSICAL EXAM:  VITAL SIGNS: ED Triage Vitals  Enc Vitals Group     BP 08/28/17 0642 (!) 181/45     Pulse Rate 08/28/17 0642 69     Resp 08/28/17 0642 16     Temp 08/28/17 0642 (!) 97.5 F (36.4 C)     Temp Source  08/28/17 0642 Oral     SpO2 08/28/17 0642 100 %     Weight 08/28/17 0723 81 lb (36.7 kg)     Height 08/28/17 0723 5' (1.524 m)     Head Circumference --      [place] Flow --      Pain Score --      Pain Loc --      Pain Edu? --      Excl. in [place]? --    Full spinal precautions maintained throughout the trauma exam. Constitutional: Awake, easily arousable, oriented to self and reports she is at [facility]. No acute distress. Does not appear intoxicated. HEENT Head: Normocephalic and atraumatic. Face: No facial bony tenderness. Stable midface Ears: No hemotympanum bilaterally. No Battle sign Eyes: No eye injury. PERRL. No raccoon eyes Nose: Nontender. No epistaxis. No rhinorrhea Mouth/Throat: Mucous membranes are moist. No oropharyngeal blood. No dental injury. Airway patent without stridor. Normal voice. Neck: no C-collar in place. No midline c-spine tenderness.  Cardiovascular: Normal rate, regular rhythm. Normal and symmetric distal pulses are present in all extremities. Pulmonary/Chest: Chest wall is stable and nontender to palpation/compression. Normal respiratory effort. Breath sounds are normal. No crepitus.  Abdominal: Soft, nontender, non distended. Sacral stage II decubitus ulcer  Musculoskeletal: Nontender with normal full range of motion in all extremities. No deformities. No thoracic or lumbar midline spinal tenderness. Pelvis is stable. Skin: Skin is warm, dry and intact. No abrasions or contutions. Psychiatric: Speech and behavior are appropriate. Neurological: Normal speech and language. Moves all extremities to command. No gross focal neurologic deficits are appreciated.  Glascow Coma Score: 4 - Opens eyes on  own 6 - Follows simple motor commands 4 - Seems confused, disoriented GCS: 14  ____________________________________________   LABS (all labs ordered are listed, but only abnormal results are displayed)  Labs Reviewed  URINALYSIS, COMPLETE (UACMP) WITH MICROSCOPIC -  Abnormal; Notable for the following components:      Result Value   Color, Urine AMBER (*)    APPearance TURBID (*)    Hgb urine dipstick SMALL (*)    Protein, ur 100 (*)    Leukocytes, UA MODERATE (*)    WBC, UA >50 (*)    All other components within normal limits  URINE CULTURE   ____________________________________________  EKG ED ECG REPORT I, Rudene Re, the attending physician, personally viewed and interpreted this ECG.  Normal sinus rhythm, rate of 65, first-degree AV block, prolonged QTC, normal axis, no ST elevations or depressions.  Unchanged from prior. ____________________________________________  RADIOLOGY  I have personally reviewed the images performed during this visit and I agree with the Radiologist's read.   Interpretation by Radiologist:  Ct Head Wo Contrast  Result Date: 08/28/2017 CLINICAL DATA:  Unwitnessed fall. EXAM: CT HEAD WITHOUT CONTRAST TECHNIQUE: Contiguous axial images were obtained from the base of the skull through the vertex without intravenous contrast. COMPARISON:  CT scan of April 26, 2017. FINDINGS: Brain: Mild diffuse cortical atrophy is noted. Mild chronic ischemic white matter disease is noted. Old right posterior infarction is noted. Old lacunar infarction is noted in right basal ganglia. No mass effect or midline shift is noted. Ventricular size is within normal limits. There is no evidence of mass lesion, hemorrhage or acute infarction. Vascular: No hyperdense vessel or unexpected calcification. Skull: Normal. Negative for fracture or focal lesion. Sinuses/Orbits: No acute finding. Other: None. IMPRESSION: Mild diffuse cortical atrophy. Mild chronic ischemic white matter disease. No acute intracranial abnormality seen. Electronically Signed   By: Marijo Conception, M.D.   On: 08/28/2017 08:16      ____________________________________________   PROCEDURES  Procedure(s) performed: None Procedures Critical Care performed:   None ____________________________________________   INITIAL IMPRESSION / ASSESSMENT AND PLAN / ED COURSE   84 y.o. female with history as listed below who presents for evaluation after falling off her bed at the [facility].  Patient unable to provide any history due to advanced dementia.  Son who is patient's healthcare power of attorney is at bedside and requests that we do not pursue any lab work.  He is worried about UTI and would like for [place] to check her urine.  We will send a catheterized sample for analysis.  We will do a head CT to rule out any intracranial trauma. Will get KEG to eval for findings consistent with hyperkalemia.  Patient understands that we are unable to rule out any needs for emergent dialysis, worsening anemia, leukocytosis, electrolyte abnormalities, or rhabdo without blood work. Patient has a stage II sacral decubitus ulcer which was dressed in the ED. No signs of overlying infection. Will recommend wound care.    _________________________ 9:48 AM on 08/28/2017 -----------------------------------------  UA positive for UTI.  Review of epic shows the prior UTIs grew E. coli and Klebsiella both sensitive to Keflex.  Patient was started on Keflex.  Culture has been sent.  EKG showing no evidence of hyperkalemia and unchanged from baseline.  I have ordered outpatient physical therapy as requested by the son since patient seems to be doing better before being moved to from rehab to assisted living.  Head CT does not show  any evidence of acute injury.  Patient remains at baseline with no other complaints.  She is going to be discharged back to her nursing home   As part of my medical decision making, I reviewed the following data within the [place] History obtained from family, Nursing notes reviewed and incorporated, Labs reviewed , EKG interpreted , Old EKG reviewed, Radiograph reviewed , Notes from prior ED visits and [place] Controlled Substance  Database    Pertinent labs & imaging results that were available during my care of the patient were reviewed by me and considered in my medical decision making (see chart for details).    ____________________________________________   FINAL CLINICAL IMPRESSION(S) / ED DIAGNOSES  Final diagnoses:  Fall, initial encounter  Acute cystitis with hematuria      NEW MEDICATIONS STARTED DURING THIS VISIT:  ED Discharge Orders         Ordered    cephALEXin (KEFLEX) 500 MG capsule  3 times daily     08/28/17 [place]     08/28/17 5885    Face-to-face encounter (required for Medicare/Medicaid patients)    Comments:  I Rudene Re certify that this patient is under my care and that I, or a nurse practitioner or physician's assistant working with me, had a face-to-face encounter that meets the physician face-to-face encounter requirements with this patient on 08/28/2017. The encounter with the patient was in whole, or in part for the following medical condition(s) which is the primary reason for home health care (List medical condition): deconditioning and gait instability   08/28/17 0926           Note:  This document was prepared using Dragon voice recognition software and may include unintentional dictation errors.    Alfred Levins, [place], MD 08/28/17 [phone]

## 2017-08-29 LAB — URINE CULTURE

## 2017-08-30 DIAGNOSIS — N2581 Secondary hyperparathyroidism of renal origin: Secondary | ICD-10-CM | POA: Diagnosis not present

## 2017-08-30 DIAGNOSIS — N186 End stage renal disease: Secondary | ICD-10-CM | POA: Diagnosis not present

## 2017-08-30 DIAGNOSIS — D631 Anemia in chronic kidney disease: Secondary | ICD-10-CM | POA: Diagnosis not present

## 2017-09-01 DIAGNOSIS — N2581 Secondary hyperparathyroidism of renal origin: Secondary | ICD-10-CM | POA: Diagnosis not present

## 2017-09-01 DIAGNOSIS — D631 Anemia in chronic kidney disease: Secondary | ICD-10-CM | POA: Diagnosis not present

## 2017-09-01 DIAGNOSIS — N186 End stage renal disease: Secondary | ICD-10-CM | POA: Diagnosis not present

## 2017-09-01 DIAGNOSIS — J449 Chronic obstructive pulmonary disease, unspecified: Secondary | ICD-10-CM | POA: Diagnosis not present

## 2017-09-01 DIAGNOSIS — L89150 Pressure ulcer of sacral region, unstageable: Secondary | ICD-10-CM | POA: Diagnosis not present

## 2017-09-01 DIAGNOSIS — N390 Urinary tract infection, site not specified: Secondary | ICD-10-CM | POA: Diagnosis not present

## 2017-09-02 ENCOUNTER — Telehealth: Payer: Self-pay

## 2017-09-02 NOTE — Telephone Encounter (Signed)
Pt's son returned [facility] call. He stated that pt was Dx with another UTI and also has an ulser on her bottom, they were told that it is at stage 2, possibly stage 3 for wound care. [name] says that over all pt is doing and feeling fine.    He is okay with a call back if needed.

## 2017-09-02 NOTE — Telephone Encounter (Signed)
Just wanted to touch base. She has been mostly in SNF, and didn't do well when tried [facility] facility

## 2017-09-02 NOTE — Telephone Encounter (Signed)
Left message for pt's son, [name], to see how the patient was doing after her recent fall.   PEC, if he calls back, please ask how she is doing and note it here. Thanks.

## 2017-09-03 ENCOUNTER — Emergency Department: Payer: Medicare Other

## 2017-09-03 ENCOUNTER — Emergency Department
Admission: EM | Admit: 2017-09-03 | Discharge: 2017-09-03 | Disposition: A | Discharge status: Skilled Nursing Facility | Payer: Medicare Other | Attending: Emergency Medicine | Admitting: Emergency Medicine

## 2017-09-03 DIAGNOSIS — L03114 Cellulitis of left upper limb: Secondary | ICD-10-CM | POA: Diagnosis not present

## 2017-09-03 DIAGNOSIS — Z951 Presence of aortocoronary bypass graft: Secondary | ICD-10-CM | POA: Insufficient documentation

## 2017-09-03 DIAGNOSIS — Z87891 Personal history of nicotine dependence: Secondary | ICD-10-CM | POA: Diagnosis not present

## 2017-09-03 DIAGNOSIS — E1122 Type 2 diabetes mellitus with diabetic chronic kidney disease: Secondary | ICD-10-CM | POA: Diagnosis not present

## 2017-09-03 DIAGNOSIS — I5032 Chronic diastolic (congestive) heart failure: Secondary | ICD-10-CM | POA: Insufficient documentation

## 2017-09-03 DIAGNOSIS — I2510 Atherosclerotic heart disease of native coronary artery without angina pectoris: Secondary | ICD-10-CM | POA: Diagnosis not present

## 2017-09-03 DIAGNOSIS — J90 Pleural effusion, not elsewhere classified: Secondary | ICD-10-CM | POA: Diagnosis not present

## 2017-09-03 DIAGNOSIS — Z992 Dependence on renal dialysis: Secondary | ICD-10-CM | POA: Diagnosis not present

## 2017-09-03 DIAGNOSIS — Z85820 Personal history of malignant melanoma of skin: Secondary | ICD-10-CM | POA: Insufficient documentation

## 2017-09-03 DIAGNOSIS — Z794 Long term (current) use of insulin: Secondary | ICD-10-CM | POA: Diagnosis not present

## 2017-09-03 DIAGNOSIS — N186 End stage renal disease: Secondary | ICD-10-CM | POA: Insufficient documentation

## 2017-09-03 DIAGNOSIS — I132 Hypertensive heart and chronic kidney disease with heart failure and with stage 5 chronic kidney disease, or end stage renal disease: Secondary | ICD-10-CM | POA: Diagnosis not present

## 2017-09-03 DIAGNOSIS — R5381 Other malaise: Secondary | ICD-10-CM | POA: Diagnosis not present

## 2017-09-03 DIAGNOSIS — E039 Hypothyroidism, unspecified: Secondary | ICD-10-CM | POA: Diagnosis not present

## 2017-09-03 DIAGNOSIS — I120 Hypertensive chronic kidney disease with stage 5 chronic kidney disease or end stage renal disease: Secondary | ICD-10-CM | POA: Diagnosis not present

## 2017-09-03 DIAGNOSIS — Z79899 Other long term (current) drug therapy: Secondary | ICD-10-CM | POA: Diagnosis not present

## 2017-09-03 DIAGNOSIS — Z96653 Presence of artificial knee joint, bilateral: Secondary | ICD-10-CM | POA: Insufficient documentation

## 2017-09-03 DIAGNOSIS — L539 Erythematous condition, unspecified: Secondary | ICD-10-CM | POA: Diagnosis present

## 2017-09-03 LAB — CBC WITH DIFFERENTIAL/PLATELET
BASOS ABS: 0.1 10*3/uL (ref 0–0.1)
BASOS PCT: 1 %
Eosinophils Absolute: 0.1 10*3/uL (ref 0–0.7)
Eosinophils Relative: 1 %
HCT: 32.8 % — ABNORMAL LOW (ref 35.0–47.0)
HEMOGLOBIN: 11 g/dL — AB (ref 12.0–16.0)
Lymphocytes Relative: 5 %
Lymphs Abs: 0.7 10*3/uL — ABNORMAL LOW (ref 1.0–3.6)
MCH: 33.6 pg (ref 26.0–34.0)
MCHC: 33.5 g/dL (ref 32.0–36.0)
MCV: 100.1 fL — ABNORMAL HIGH (ref 80.0–100.0)
Monocytes Absolute: 1.1 10*3/uL — ABNORMAL HIGH (ref 0.2–0.9)
Monocytes Relative: 9 %
NEUTROS ABS: 11.1 10*3/uL — AB (ref 1.4–6.5)
NEUTROS PCT: 84 %
Platelets: 112 10*3/uL — ABNORMAL LOW (ref 150–440)
RBC: 3.27 MIL/uL — ABNORMAL LOW (ref 3.80–5.20)
RDW: 20 % — ABNORMAL HIGH (ref 11.5–14.5)
WBC: 13.1 10*3/uL — AB (ref 3.6–11.0)

## 2017-09-03 LAB — URINALYSIS, COMPLETE (UACMP) WITH MICROSCOPIC
BILIRUBIN URINE: NEGATIVE
GLUCOSE, UA: NEGATIVE mg/dL
HGB URINE DIPSTICK: NEGATIVE
Ketones, ur: NEGATIVE mg/dL
Nitrite: NEGATIVE
Protein, ur: 100 mg/dL — AB
SPECIFIC GRAVITY, URINE: 1.012 (ref 1.005–1.030)
pH: 7 (ref 5.0–8.0)

## 2017-09-03 LAB — BASIC METABOLIC PANEL
ANION GAP: 12 (ref 5–15)
BUN: 33 mg/dL — ABNORMAL HIGH (ref 8–23)
CALCIUM: 8.9 mg/dL (ref 8.9–10.3)
CO2: 31 mmol/L (ref 22–32)
Chloride: 94 mmol/L — ABNORMAL LOW (ref 98–111)
Creatinine, Ser: 3.83 mg/dL — ABNORMAL HIGH (ref 0.44–1.00)
GFR, EST AFRICAN AMERICAN: 11 mL/min — AB (ref >60)
GFR, EST NON AFRICAN AMERICAN: 9 mL/min — AB (ref >60)
Glucose, Bld: 94 mg/dL (ref 70–99)
Potassium: 3.8 mmol/L (ref 3.5–5.1)
SODIUM: 137 mmol/L (ref 135–145)

## 2017-09-03 MED ORDER — DOXYCYCLINE HYCLATE 100 MG PO TABS
100.0000 mg | ORAL_TABLET | Freq: Once | ORAL | Status: AC
  Administered 2017-09-03: 100 mg via ORAL
  Filled 2017-09-03: qty 1

## 2017-09-03 MED ORDER — DOXYCYCLINE HYCLATE 100 MG PO CAPS: 100.0000 mg | ORAL_CAPSULE | Freq: Two times a day (BID) | ORAL | 0 refills | Status: AC

## 2017-09-03 NOTE — ED Notes (Signed)
XR at bedside

## 2017-09-03 NOTE — ED Provider Notes (Signed)
[facility] Emergency Department Provider Note  ____________________________________________  Time seen: Approximately 5:08 PM  I have reviewed the triage vital signs and the nursing notes.   HISTORY  Chief Complaint Vascular Access Problem    HPI [name] is a 84 y.o. female with a history of aortic stenosis, congestive heart failure, end-stage renal disease on hemodialysis, diabetes who was sent to the ED today from dialysis due to concern about skin infection over her AV fistula.  She had a normal dialysis session on Wednesday.  She went today for routine dialysis but was found to have some erythema over the fistula so they did not perform dialysis and instead sent her to the ED for evaluation.  She denies any acute complaints.      Past Medical History:  Diagnosis Date  . Anemia    NOS  . Anxiety   . Aortic stenosis   . Cancer [facility])    Mole on top of head to be removed in July 2013  . Chronic diastolic CHF (congestive heart failure) ([place])    a. 01/2017 Echo: EF 60-65%, no rwma, Gr2 DD, mild to mod MR, mildly dil LA/RA, nl RV fxn, Sev TR, PASP 47mmHg.  . Constipation   . Coronary artery disease    a. 2009 CABG x 3 w/ bioprosthetic AVR.  . Diabetes mellitus    type II;was on Glipizide but has been off x 24mon  . Dialysis patient ([place])   . Diarrhea   . ESRD (end stage renal disease) ([place])   . GERD (gastroesophageal reflux disease)   . History of blood transfusion   . Hyperlipidemia    takes Simvastatin nightly  . Hypertension    takes Metoprolol daily  . Hypothyroidism    takes Synthroid daily  . Migraine    hx of  . Oligouria   . Osteoarthritis   . Osteopenia   . Peripheral vascular disease ([place])   . Personal history of colonic polyps   . Pulmonary hypertension ([place])   . Urinary incontinence   . UTI (urinary tract infection)      Patient Active Problem List   Diagnosis Date Noted  . ESRD on hemodialysis ([place])   . Failure to  thrive in adult   . Goals of care, counseling/discussion   . Palliative care by specialist   . Acute respiratory failure with hypoxia ([place]) 06/27/2017  . Pressure injury of skin 05/19/2017  . GIB (gastrointestinal bleeding) 05/17/2017  . GI bleed 05/05/2017  . Pelvic fracture ([place]) 04/26/2017  . Protein-calorie malnutrition, severe 01/07/2017  . CHF (congestive heart failure) ([place]) 01/03/2017  . UTI (urinary tract infection) 01/03/2017  . Acute metabolic encephalopathy 53/66/4403  . Cerebrovascular accident (CVA) due to occlusion of right middle cerebral artery ([place]) 10/24/2015  . Malnutrition of mild degree ([place]) 10/24/2015  . Altered mental status 09/18/2015  . Preventative health care 05/03/2014  . Peripheral vascular disease ([place]) 12/21/2013  . Toe amputation status ([place]) 08/29/2013  . History of recent fall 04/18/2013  . HTN (hypertension), malignant 03/22/2013  . S/P aortic valve replacement with bioprosthetic valve 11/15/2012  . S/P CABG x 3 11/15/2012  . Anemia of chronic kidney failure 12/22/2010  . Thrombocytopenia ([place]) 12/13/2010  . End stage renal disease on dialysis ([place]) 12/13/2010  . S/P aortic valve replacement 08/11/2010  . Arterial insufficiency-lower 07/25/2010  . NEURODERMATITIS 04/08/2009  . Carotid arterial disease ([place]) 01/01/2009  . Coronary atherosclerosis 02/11/2007  . AORTIC STENOSIS 10/04/2006  .  ANXIETY 05/30/2006  . Pulmonary hypertension ([place]) 05/30/2006  . Hypothyroidism 05/27/2006  . Type 2 diabetes mellitus with renal manifestations, controlled ([place]) 05/27/2006  . Hyperlipidemia 05/27/2006  . Essential hypertension 05/27/2006  . GERD 05/27/2006  . OSTEOARTHRITIS 05/27/2006  . OSTEOPENIA 05/27/2006  . URINARY INCONTINENCE 05/27/2006     Past Surgical History:  Procedure Laterality Date  . A/V FISTULAGRAM [name] 01/07/2017   Procedure: A/V Fistulagram;  Surgeon: Algernon Huxley, MD;  Location: [place] [facility];  Service: Cardiovascular;   Laterality: [name];  . A/V SHUNT INTERVENTION [name] 01/07/2017   Procedure: A/V SHUNT INTERVENTION;  Surgeon: Algernon Huxley, MD;  Location: [place] [facility];  Service: Cardiovascular;  Laterality: [name];  . ABDOMINAL HYSTERECTOMY    . adenosine myoview  2007   benign, EF 69%  . AMPUTATION  06/18/2011   Procedure: AMPUTATION DIGIT;  Surgeon: Newt Minion, MD;  Location: [place];  Service: Orthopedics;  Laterality: Left;  Left 2nd Toe Amputation at MTP Joint  . Amputation-left great toe  7/12   Dr Marlou Sa  . AORTIC VALVE REPLACEMENT  2009  . APPENDECTOMY    . AV FISTULA PLACEMENT  12/22/2010   Procedure: ARTERIOVENOUS (AV) FISTULA CREATION;  Surgeon: Hinda Lenis, MD;  Location: [place];  Service: Vascular;  Laterality: Left;  Creation of Left Brachial-Cephalic Fistula  . CARDIAC CATHETERIZATION  2000   cad  . CAROTID ENDARTERECTOMY  2011   Right  . CATARACT EXTRACTION    . COLONOSCOPY  05/21/2017   Procedure: COLONOSCOPY;  Surgeon: Toledo, Benay Pike, MD;  Location: [facility] ENDOSCOPY;  Service: Gastroenterology;;  . CORONARY ARTERY BYPASS GRAFT     od  . CORONARY ARTERY BYPASS GRAFT  2009  . ESOPHAGOGASTRODUODENOSCOPY [name] 05/21/2017   Procedure: ESOPHAGOGASTRODUODENOSCOPY (EGD);  Surgeon: Toledo, Benay Pike, MD;  Location: [facility] ENDOSCOPY;  Service: Gastroenterology;  Laterality: [name];  . EYE SURGERY     BIlateral  . INSERTION OF DIALYSIS CATHETER  12/18/2010   Procedure: INSERTION OF DIALYSIS CATHETER;  Surgeon: Mal Misty, MD;  Location: [place];  Service: Vascular;  Laterality: Right;  . REPLACEMENT TOTAL KNEE BILATERAL  05/1998  . TONSILLECTOMY    . Traumatic Amputation of Right DIP joint of Index Finger       Prior to Admission medications   Medication Sig Start Date End Date Taking? Authorizing Provider  zinc sulfate 220 (50 Zn) MG capsule Take 220 mg by mouth daily.   Yes [provider]  acetaminophen (TYLENOL) 325 MG tablet Take 1 tablet (325 mg total) by mouth every 6  (six) hours as needed for mild pain or fever (or Fever >/= 101). 04/28/17   Gouru, Aruna, MD  albuterol (PROVENTIL) (2.5 MG/3ML) 0.083% nebulizer solution Take 3 mLs (2.5 mg total) by nebulization every 2 (two) hours as needed for wheezing. Patient not taking: Reported on 06/27/2017 04/28/17   Nicholes Mango, MD  Amino Acids-Protein Hydrolys (FEEDING SUPPLEMENT, PRO-STAT SUGAR FREE 64,) LIQD Take 30 mLs by mouth 3 (three) times daily with meals.    [provider]  amLODipine (NORVASC) 10 MG tablet Take 1 tablet (10 mg total) by mouth daily. 05/27/17   Nicholes Mango, MD  calcium carbonate (TUMS - DOSED IN MG ELEMENTAL CALCIUM) 500 MG chewable tablet Chew 1 tablet (200 mg of elemental calcium total) by mouth 3 (three) times daily with meals. 12/06/12   Geradine Girt, DO  cephALEXin (KEFLEX) 500 MG capsule Take 1 capsule (500 mg total) by  mouth 3 (three) times daily for 7 days. 08/28/17 09/04/17  Rudene Re, MD  Chlorhexidine Gluconate Cloth 2 % PADS Apply 6 each topically daily at 6 (six) AM. 07/09/17   Salary, Avel Peace, MD  divalproex (DEPAKOTE) 125 MG DR tablet Take 5 tablets (625 mg total) by mouth 2 (two) times daily. 07/08/17 08/07/17  Salary, Avel Peace, MD  doxycycline (VIBRAMYCIN) 100 MG capsule Take 1 capsule (100 mg total) by mouth 2 (two) times daily. 09/03/17   Carrie Mew, MD  epoetin alfa (EPOGEN,PROCRIT) 19758 UNIT/ML injection Inject 1 mL (10,000 Units total) into the vein every Monday, Wednesday, and Friday with hemodialysis. Patient not taking: Reported on 06/27/2017 05/24/17   Nicholes Mango, MD  feeding supplement, ENSURE ENLIVE, (ENSURE ENLIVE) LIQD Take 237 mLs by mouth 3 (three) times daily between meals. Patient not taking: Reported on 06/27/2017 01/08/17   Fritzi Mandes, MD  folic acid (FOLVITE) 0.5 MG tablet Take 0.5 tablets (0.5 mg total) by mouth daily. 12/06/12   Geradine Girt, DO  Ganciclovir (ZIRGAN) 0.15 % GEL Place 1 drop into the right eye 4 (four) times daily. 01/08/17    Fritzi Mandes, MD  hydrALAZINE (APRESOLINE) 25 MG tablet Take 25 mg by mouth 4 (four) times daily.     [provider]  insulin aspart (NOVOLOG) 100 UNIT/ML injection Inject 0-9 Units into the skin 3 (three) times daily with meals. CBG < 70: implement hypoglycemia protocol CBG 70 - 120: 0 units CBG 121 - 150: 1 unit CBG 151 - 200: 2 units CBG 201 - 250: 3 units CBG 251 - 300: 5 units CBG 301 - 350: 7 units CBG 351 - 400: 9 units CBG > 400: call MD and obtain STAT lab verification 05/24/17   Gouru, Illene Silver, MD  ipratropium-albuterol (DUONEB) 0.5-2.5 (3) MG/3ML SOLN Take 3 mLs by nebulization every 6 (six) hours as needed. 07/08/17   Salary, Avel Peace, MD  levothyroxine (SYNTHROID, LEVOTHROID) 75 MCG tablet TAKE ONE TABLET BY MOUTH ONCE DAILY 07/07/16   Viviana Simpler I, MD  metoprolol tartrate (LOPRESSOR) 50 MG tablet TAKE ONE TABLET BY MOUTH TWICE DAILY Patient taking differently: Take 75 mg by mouth twice daily. 02/05/17   Minna Merritts, MD  Multiple Vitamins-Minerals (CEROVITE SENIOR) TABS Take 1 tablet by mouth daily.    [provider]  multivitamin (RENA-VIT) TABS tablet Take 1 tablet by mouth at bedtime. Patient not taking: Reported on 06/27/2017 01/08/17   Fritzi Mandes, MD  ondansetron (ZOFRAN) 4 MG tablet Take 1 tablet (4 mg total) by mouth every 6 (six) hours as needed for nausea. 04/28/17   Nicholes Mango, MD  pantoprazole (PROTONIX) 40 MG tablet Take 1 tablet (40 mg total) by mouth 2 (two) times daily before a meal. 05/24/17   Gouru, Aruna, MD  polyethylene glycol (MIRALAX / GLYCOLAX) packet Take 17 g by mouth daily as needed for mild constipation. Patient taking differently: Take 17 g by mouth daily.  05/24/17   Nicholes Mango, MD  polyvinyl alcohol (LIQUIFILM TEARS) 1.4 % ophthalmic solution Place 1 drop into both eyes as needed for dry eyes. 09/22/15   Fritzi Mandes, MD  pravastatin (PRAVACHOL) 40 MG tablet TAKE ONE TABLET BY MOUTH IN THE EVENING Patient taking differently: TAKE  ONE TABLET BY MOUTH DAILY 07/21/16   Minna Merritts, MD  senna-docusate (SENOKOT-S) 8.6-50 MG tablet Take 1 tablet by mouth at bedtime as needed for mild constipation. 04/28/17   Nicholes Mango, MD  vitamin C (VITAMIN C) 500  MG tablet Take 1 tablet (500 mg total) by mouth daily. Patient taking differently: Take 500 mg by mouth 2 (two) times daily.  12/06/12   Geradine Girt, DO     Allergies Nitrofurantoin; Ace inhibitors; Captopril; Enalapril maleate; Ramipril; Sulfa antibiotics; and Verapamil   Family History  Family history unknown: Yes    Social History Social History   Tobacco Use  . Smoking status: Former Smoker    Years: 15.00    Types: Cigarettes    Last attempt to quit: 01/06/1943    Years since quitting: 74.7  . Smokeless tobacco: Never Used  Substance Use Topics  . Alcohol use: Yes    Comment: Wine occasionally  . Drug use: No    Review of Systems  Constitutional:   No fever or chills.  ENT:   No sore throat. No rhinorrhea. Cardiovascular:   No chest pain or syncope. Respiratory:   No dyspnea or cough. Gastrointestinal:   Negative for abdominal pain, vomiting and diarrhea.  Musculoskeletal:   Negative for focal pain or swelling All other systems reviewed and are negative except as documented above in ROS and HPI.  ____________________________________________   PHYSICAL EXAM:  VITAL SIGNS: ED Triage Vitals  Enc Vitals Group     BP 09/03/17 1341 (!) 176/52     Pulse Rate 09/03/17 1341 78     Resp 09/03/17 1341 16     Temp 09/03/17 1341 (!) 97.5 F (36.4 C)     Temp Source 09/03/17 1341 Oral     SpO2 09/03/17 1341 100 %     Weight 09/03/17 1339 80 lb 14.5 oz (36.7 kg)     Height --      Head Circumference --      Peak Flow --      Pain Score --      Pain Loc --      Pain Edu? --      Excl. in [place]? --     Vital signs reviewed, nursing assessments reviewed.   Constitutional:   Alert and oriented.  Chronically ill-appearing but nontoxic. Eyes:    Conjunctivae are normal. EOMI. PERRL. ENT      Head:   Normocephalic and atraumatic.      Nose:   No congestion/rhinnorhea.       Mouth/Throat:   MMM, no pharyngeal erythema. No peritonsillar mass.       Neck:   No meningismus. Full ROM. Hematological/Lymphatic/Immunilogical:   No cervical lymphadenopathy. Cardiovascular:   RRR. Symmetric bilateral radial and DP pulses.  Loud systolic murmur. Cap refill less than 2 seconds.  Left upper extremity AV fistula with strong pulse and thrill. Respiratory:   Normal respiratory effort without tachypnea/retractions. Breath sounds are clear and equal bilaterally. No wheezes/rales/rhonchi. Gastrointestinal:   Soft and nontender. Non distended. There is no CVA tenderness.  No rebound, rigidity, or guarding. Genitourinary:   deferred Musculoskeletal:   Normal range of motion in all extremities. No joint effusions.  No lower extremity tenderness.  No edema. Neurologic:   Normal speech and language.  Motor grossly intact. No acute focal neurologic deficits are appreciated.  Skin:    Skin is warm, dry and intact.  Healing previous cannulation sites over the AV fistula.  Small amount of erythema around the previous puncture sites without induration purulence warmth or tenderness.  ____________________________________________    LABS (pertinent positives/negatives) (all labs ordered are listed, but only abnormal results are displayed) Labs Reviewed  BASIC METABOLIC PANEL - Abnormal; Notable  for the following components:      Result Value   Chloride 94 (*)    BUN 33 (*)    Creatinine, Ser 3.83 (*)    GFR calc non Af Amer 9 (*)    GFR calc Af Amer 11 (*)    All other components within normal limits  CBC WITH DIFFERENTIAL/PLATELET - Abnormal; Notable for the following components:   WBC 13.1 (*)    RBC 3.27 (*)    Hemoglobin 11.0 (*)    HCT 32.8 (*)    MCV 100.1 (*)    RDW 20.0 (*)    Platelets 112 (*)    Neutro Abs 11.1 (*)    Lymphs Abs 0.7 (*)     Monocytes Absolute 1.1 (*)    All other components within normal limits   ____________________________________________   EKG  Interpreted by me Sinus rhythm rate of 76, right axis, normal intervals.  Normal QRS and ST segments.  Isolated T wave inversion in lead III which is nonspecific.  ____________________________________________    RADIOLOGY  Dg Chest Portable 1 View  Result Date: 09/03/2017 CLINICAL DATA:  Generalized weakness EXAM: PORTABLE CHEST 1 VIEW COMPARISON:  08/06/2017 FINDINGS: CABG sequelae. Unchanged cardiomegaly. Small right pleural effusion with associated atelectasis, unchanged. No pulmonary edema or focal airspace consolidation. IMPRESSION: Unchanged cardiomegaly and small right pleural effusion with basilar atelectasis. Electronically Signed   By: Ulyses Jarred M.D.   On: 09/03/2017 14:09    ____________________________________________   PROCEDURES Procedures  ____________________________________________    CLINICAL IMPRESSION / ASSESSMENT AND PLAN / ED COURSE  Pertinent labs & imaging results that were available during my care of the patient were reviewed by me and considered in my medical decision making (see chart for details).    Patient presents with slight amount of erythema around her previous cannulation site.  Possibly early cellulitis although not impressive.  Doubt abscess necrotizing fasciitis or other soft tissue infection complicating AV fistula.  No foreign material in the upper arm.  No acute symptoms.  Labs and chest x-ray are reassuring.  Discussed with vascular Dr. Delana Meyer who agrees with plan for oral antibiotics and continued outpatient dialysis when able absent any other changes in her clinical condition.  Discussed with nephrology Dr. Juleen China who will arrange make-up dialysis session tomorrow.  Care discussed with the son at bedside, will plan to discharge back to nursing home, start doxycycline here in the ED and provide a  prescription.      ____________________________________________   FINAL CLINICAL IMPRESSION(S) / ED DIAGNOSES    Final diagnoses:  Cellulitis of left upper extremity  End-stage renal disease on hemodialysis [facility])     ED Discharge Orders         Ordered    doxycycline (VIBRAMYCIN) 100 MG capsule  2 times daily     09/03/17 1708          Portions of this note were generated with dragon dictation software. Dictation errors may occur despite best attempts at proofreading.    Carrie Mew, MD 09/03/17 [phone]

## 2017-09-03 NOTE — ED Notes (Signed)
2 RNs unsuccessful at obtaining blood work, lab called and going to send staff to obtain blood work from patient  1406 Lab to bedside

## 2017-09-03 NOTE — ED Triage Notes (Signed)
Pt was dropped off at dialysis by the [facility] where she stays. Dialysis did not attempt today due to inflammation at the fistula per EMS. 2L 02 at baseline, EMS vitals as follows: BGL 143 180/73 100% 2L.

## 2017-09-03 NOTE — ED Notes (Signed)
Pt straight cath'd for urine per [facility] policy. Pt tolerated well.

## 2017-09-03 NOTE — ED Notes (Signed)
Pts son taking pt back to [facility]. Upon dc pt alert, ABCs intact. NAD.

## 2017-09-03 NOTE — ED Notes (Signed)
Pts family at bedside given update. Pt resting in stretcher with ABCs intact. VSS. NAD.

## 2017-09-03 NOTE — Discharge Instructions (Signed)
Your labs and chest xray today were okay. Take doxycycline twice a day to treat possible early skin infection over your dialysis site.  Dr. Juleen China will arrange a make up dialysis session tomorrow (09/04/17).  Please answer any incoming phone calls that may be the dialysis center calling with an appointment time.

## 2017-09-04 DIAGNOSIS — Z992 Dependence on renal dialysis: Secondary | ICD-10-CM | POA: Diagnosis not present

## 2017-09-04 DIAGNOSIS — D631 Anemia in chronic kidney disease: Secondary | ICD-10-CM | POA: Diagnosis not present

## 2017-09-04 DIAGNOSIS — N186 End stage renal disease: Secondary | ICD-10-CM | POA: Diagnosis not present

## 2017-09-04 DIAGNOSIS — N2581 Secondary hyperparathyroidism of renal origin: Secondary | ICD-10-CM | POA: Diagnosis not present

## 2017-09-04 DIAGNOSIS — I120 Hypertensive chronic kidney disease with stage 5 chronic kidney disease or end stage renal disease: Secondary | ICD-10-CM | POA: Diagnosis not present

## 2017-09-05 DIAGNOSIS — N186 End stage renal disease: Secondary | ICD-10-CM | POA: Diagnosis not present

## 2017-09-05 DIAGNOSIS — Z992 Dependence on renal dialysis: Secondary | ICD-10-CM | POA: Diagnosis not present

## 2017-09-05 LAB — URINE CULTURE: Culture: >=100000 — AB

## 2017-09-06 DIAGNOSIS — Z992 Dependence on renal dialysis: Secondary | ICD-10-CM | POA: Diagnosis not present

## 2017-09-06 DIAGNOSIS — N186 End stage renal disease: Secondary | ICD-10-CM | POA: Diagnosis not present

## 2017-09-06 NOTE — Progress Notes (Signed)
ED Antimicrobial Stewardship Positive Culture Follow Up   [name] is an 84 y.o. female who presented to [facility] on 09/03/2017 with a chief complaint of  Chief Complaint  Patient presents with  . Vascular Access Problem    Recent Results (from the past 720 hour(s))  Urine Culture     Status: Abnormal   Collection Time: 08/28/17  8:37 AM  Result Value Ref Range Status   Specimen Description   Final    URINE, RANDOM Performed at [facility], [address], [place], [place] [address]    Special Requests   Final    NONE Performed at [facility], [address] [place], [place] [address]    Culture (A)  Final    <10,000 COLONIES/mL INSIGNIFICANT GROWTH Performed at [facility] [address] [place], [place] [address]    Report Status 08/29/2017 FINAL  Final  Urine culture     Status: Abnormal   Collection Time: 09/03/17  5:54 PM  Result Value Ref Range Status   Specimen Description   Final    URINE, RANDOM Performed at [facility], [address] [place], [place] [address]    Special Requests   Final    NONE Performed at [facility], [address] [place], [place] [address]    Culture (A)  Final    >=100,000 COLONIES/mL VANCOMYCIN RESISTANT ENTEROCOCCUS ISOLATED   Report Status 09/05/2017 FINAL  Final   Organism ID, Bacteria VANCOMYCIN RESISTANT ENTEROCOCCUS ISOLATED (A)  Final      Susceptibility   Vancomycin resistant enterococcus isolated - MIC*    AMPICILLIN >=32 RESISTANT Resistant     LEVOFLOXACIN >=8 RESISTANT Resistant     NITROFURANTOIN 64 INTERMEDIATE Intermediate     VANCOMYCIN >=32 RESISTANT Resistant     LINEZOLID 2 SENSITIVE Sensitive     * >=100,000 COLONIES/mL VANCOMYCIN RESISTANT ENTEROCOCCUS ISOLATED    [x]  Treated with doxycycline, organism resistant to prescribed antimicrobial  New antibiotic prescription:    ED Provider:    09/06/17: SNF patient at [facility]. Called  [phone] and spoke with floor RN to make aware.  Faxed copy of ED Cx report to [phone]   Arjun Hard A 09/06/2017, 4:49 PM Clinical Pharmacist

## 2017-09-07 ENCOUNTER — Telehealth: Payer: Self-pay | Admitting: Internal Medicine

## 2017-09-07 NOTE — Telephone Encounter (Signed)
Spoke to [place] at [facility] and transferred me to [employer] for verbal orders.

## 2017-09-07 NOTE — Telephone Encounter (Signed)
PLEASE NOTE: All timestamps contained within this report are represented as [place] Standard Time. CONFIDENTIALTY NOTICE: This fax transmission is intended only for the addressee. It contains information that is legally privileged, confidential or otherwise protected from use or disclosure. If you are not the intended recipient, you are strictly prohibited from reviewing, disclosing, copying using or disseminating any of this information or taking any action in reliance on or regarding this information. If you have received this fax in error, please notify us immediately by telephone so that we can arrange for its return to [place]. Phone: [phone], Toll-Free: [phone], Fax: [phone] Page: 1 of 2 Call Id: [id number] [place] Patient Name: [name] Gender: Female DOB: 1920/09/08 Age: 84 Y 78 M 13 D Return Phone Number: Address: City/State/Zip: [place] Night - Client Client Site [place] Physician Viviana Simpler - MD Contact Type Call Who Is Calling Patient / Member / Family / Caregiver Call Type Triage / Clinical Caller Name Hospice RN, [phone] Relationship To Patient Care Giver Return Phone Number Please choose phone number Chief Complaint Unknown Complaint Reason for Call Symptomatic / Request for Health Information Initial Comment Caller with [facility] needing to request hospice for patient. She had last dialysis Saturday. Translation No No Triage Reason Other Nurse Assessment Nurse: Windle Guard, RN, Olin Hauser Date/Time (Eastern Time): 09/06/2017 3:07:03 PM Confirm and document reason for call. If symptomatic, describe symptoms. ---Caller states pt stopped dialysis on Sat and son requesting pt be placed on Hospice. Wanting order to evaluate for Hospice. Does the patient have any new or worsening  symptoms? ---Yes Will a triage be completed? ---No Select reason for no triage. ---Other Guidelines Guideline Title Affirmed Question Affirmed Notes Nurse Date/Time ([place] Time) Disp. Time Eilene Ghazi Time) Disposition Final User 09/06/2017 2:05:42 PM Send to [place], RN, Levada Dy 09/06/2017 2:19:48 PM Attempt made - message left Windle Guard, RN, Olin Hauser 09/06/2017 2:23:20 PM Attempt made - message left Windle Guard, RNOlin Hauser 09/06/2017 3:13:30 PM Called On-Call Provider [place], RN, Olin Hauser 09/06/2017 3:16:40 PM Clinical Call Yes Windle Guard, RN, Olin Hauser Comments User: Harvin Hazel, RN Date/Time Eilene Ghazi Time): 09/06/2017 2:36:57 PM PLEASE NOTE: All timestamps contained within this report are represented as [place] Standard Time. CONFIDENTIALTY NOTICE: This fax transmission is intended only for the addressee. It contains information that is legally privileged, confidential or otherwise protected from use or disclosure. If you are not the intended recipient, you are strictly prohibited from reviewing, disclosing, copying using or disseminating any of this information or taking any action in reliance on or regarding this information. If you have received this fax in error, please notify us immediately by telephone so that we can arrange for its return to [place]. Phone: [phone], Toll-Free: [phone], Fax: [phone] Page: 2 of 2 Call Id: [id number] Comments Spoke with employee who stated caller was out of the office at time of call. States caller should be back in 20-30 mins. Will wit and attempt to call back then. User: Harvin Hazel, RN Date/Time Eilene Ghazi Time): 09/06/2017 3:17:32 PM Notified caller of [facility] Provider's instructions advising to call pt's nephrologist for consult, if unable to get then call office in the morning and speak with pt's PCP. Paging DoctorName Phone DateTime Result/Outcome Message Type Notes Grier Mitts- MD [phone] 09/06/2017 3:13:30 PM Called On Call Provider -  Reached Doctor Paged Grier Mitts- MD 09/06/2017 3:16:26 PM Spoke with On  Call - General Message Result Instructed to advise to call pt's nephrologist for consult, if unable to get then call office in the morning and speak with pt's PCP.

## 2017-09-07 NOTE — Telephone Encounter (Signed)
Okay to evaluate for hospice

## 2017-09-07 NOTE — Telephone Encounter (Signed)
Copied from [place] [id number]. Topic: Quick Communication - See Telephone Encounter >> Sep 07, 2017  8:40 AM Burchel, [name] wrote: CRM for notification. See Telephone encounter for: 09/07/17.  Hilda Blades reports that pt stopped dialysis on Saturday 8/31 and pt's son is requesting a hospice assessment.  Please advise.  Debra: [phone]

## 2017-09-07 NOTE — Telephone Encounter (Signed)
Pt was taken off dyalisis on Monday and the Dr. Suzan Slick  Is recommending  That hospice come in    Dr. Silvio Pate needs to start hospice care   Best number [phone]

## 2017-09-07 NOTE — Telephone Encounter (Signed)
Orders have been given to hospice

## 2017-09-10 DIAGNOSIS — R627 Adult failure to thrive: Secondary | ICD-10-CM | POA: Diagnosis not present

## 2017-09-10 DIAGNOSIS — N186 End stage renal disease: Secondary | ICD-10-CM | POA: Diagnosis not present

## 2017-10-05 DEATH — deceased

## 2018-10-07 IMAGING — CT CT HEAD W/O CM
4 of 5 series · 16 of 47 positions shown, 18 images · non-contrast
Comparison: Head CT 04/26/2017

CLINICAL DATA: Altered mental status

EXAM:
CT HEAD WITHOUT CONTRAST
TECHNIQUE: Contiguous axial images were obtained from the base of the skull
through the vertex without intravenous contrast.

[Series 2: head wo · axial · 0.44mm/px · z∈[+325,+435]mm · 8 of 30 slices shown, 10 images]
[im 4/30  brain]
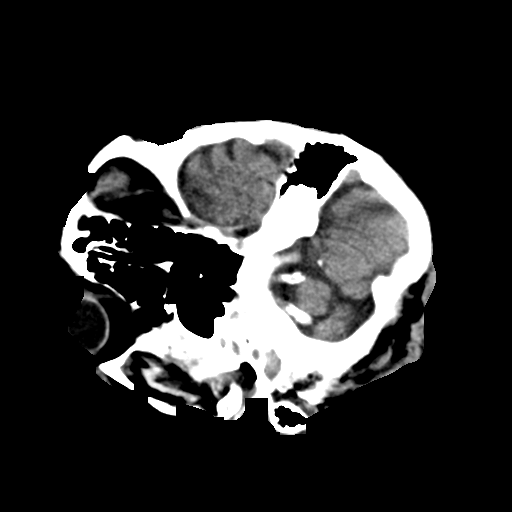
[im 4/30  bone]
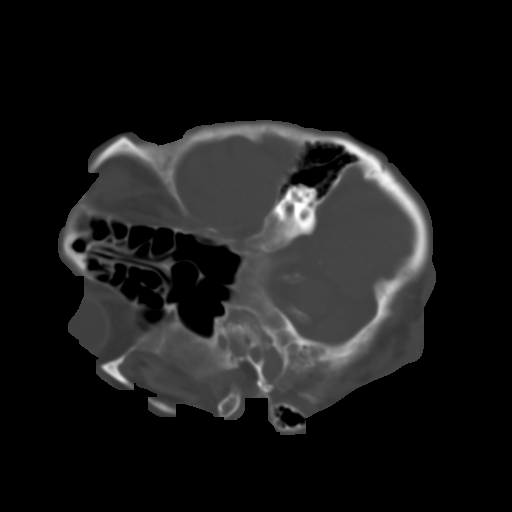
[im 7/30  brain]
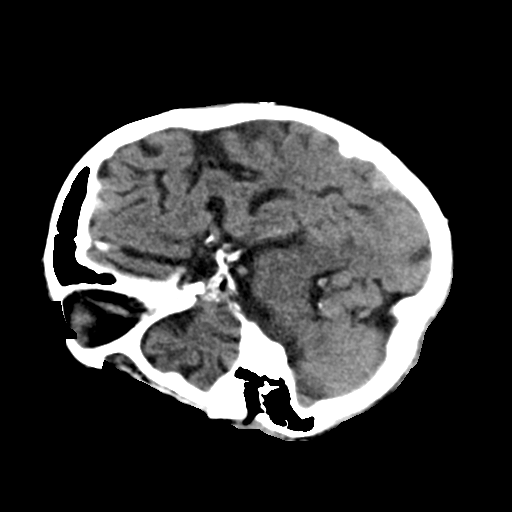
[im 10/30  brain]
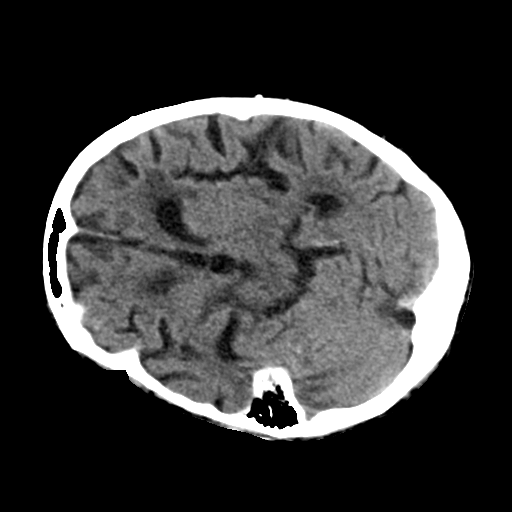
[im 13/30  brain]
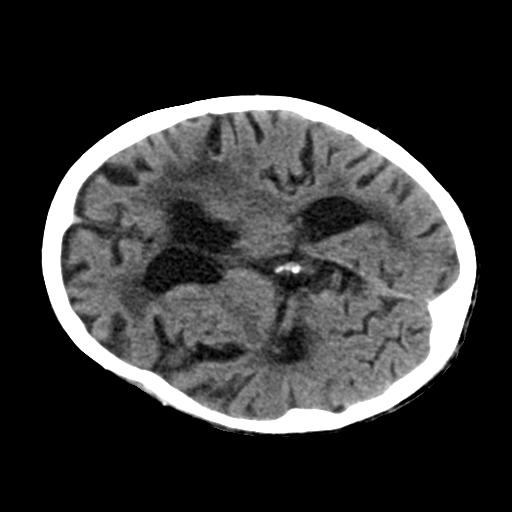
[im 17/30  brain]
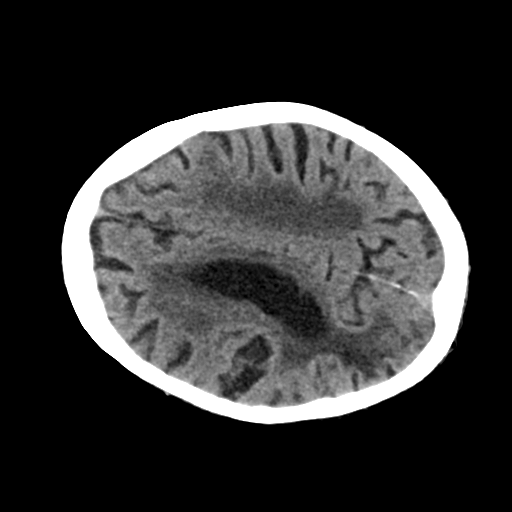
[im 17/30  bone]
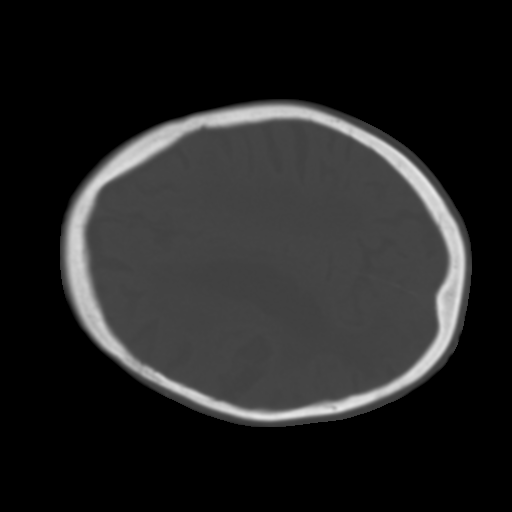
[im 20/30  brain]
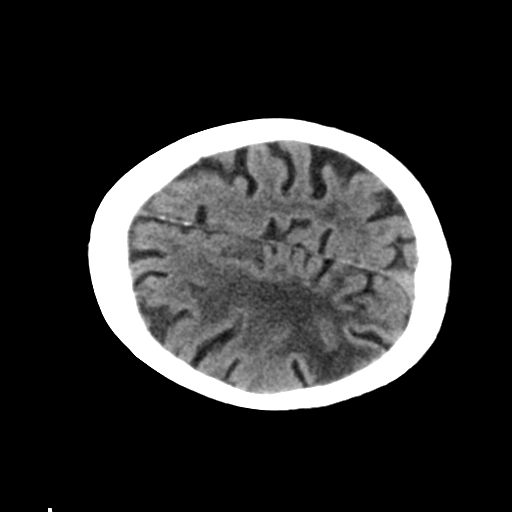
[im 23/30  brain]
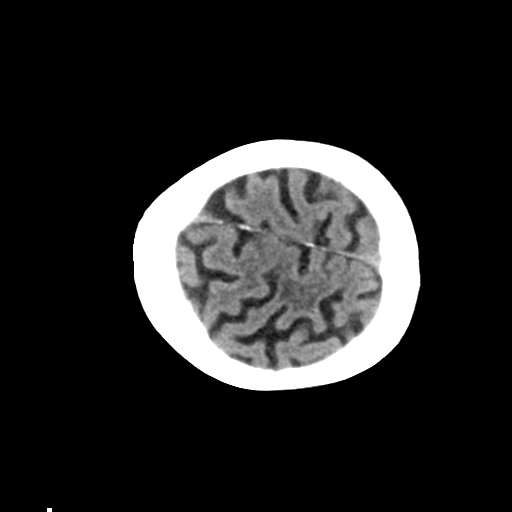
[im 26/30  brain]
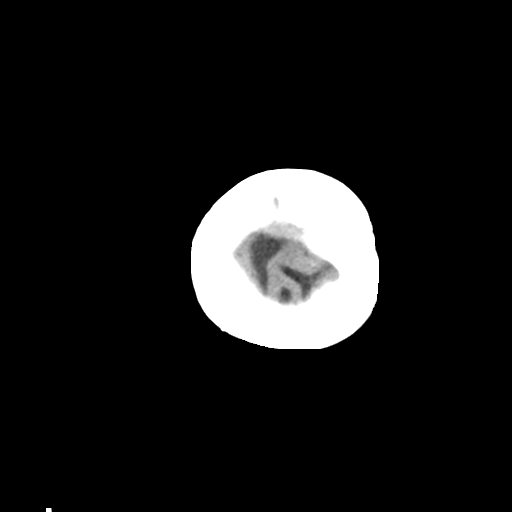

[Series 4: ax head wo recon · axial · 0.33mm/px · z∈[+358,+372]mm · 2 of 27 slices shown]
[im 3/27  brain]
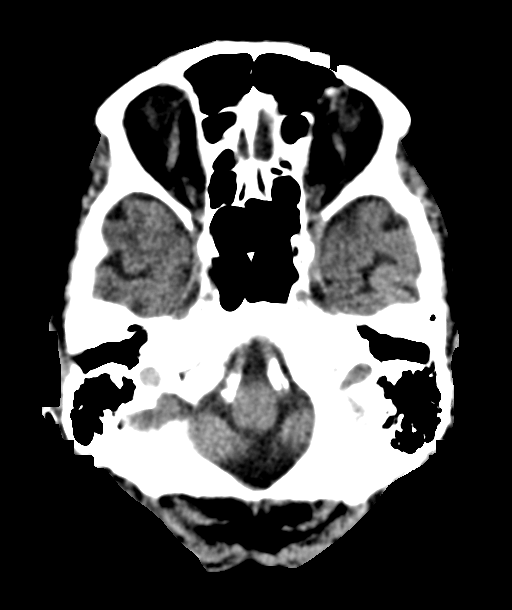
[im 6/27  brain]
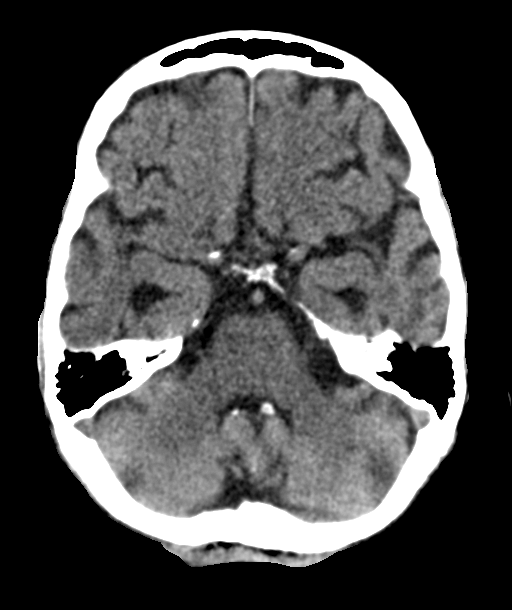

[Series 6: coronal soft tissue · coronal · 0.31mm/px · 3 of 64 slices shown]
[im 22/64  brain]
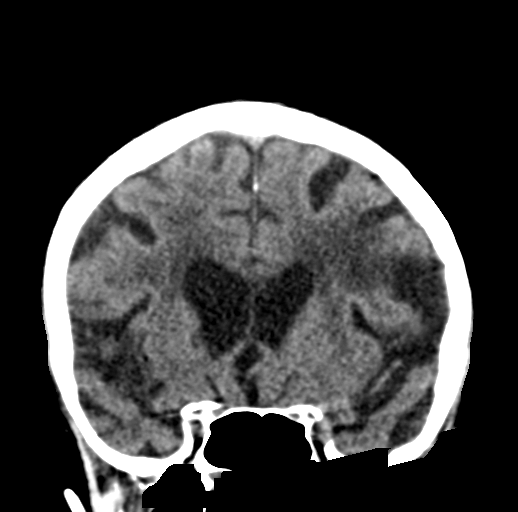
[im 29/64  brain]
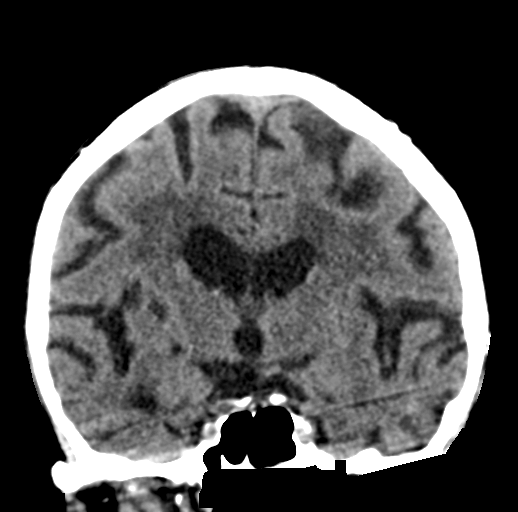
[im 36/64  brain]
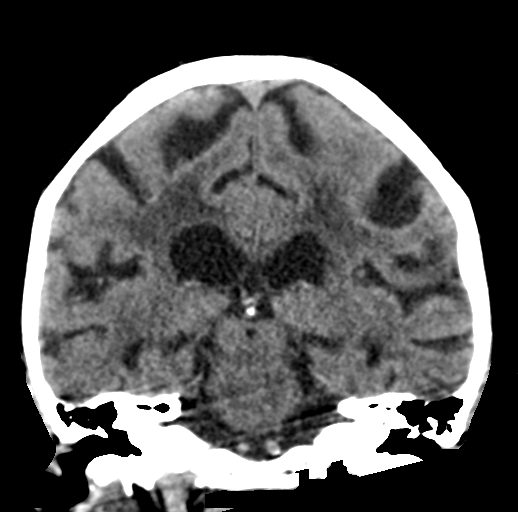

[Series 7: sagittal soft tissue · sagittal · 0.31mm/px · 3 of 51 slices shown]
[im 21/51  brain]
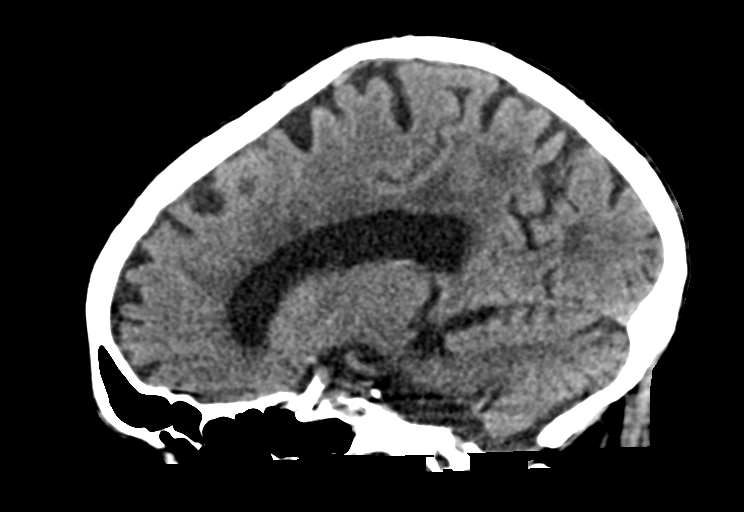
[im 26/51  brain]
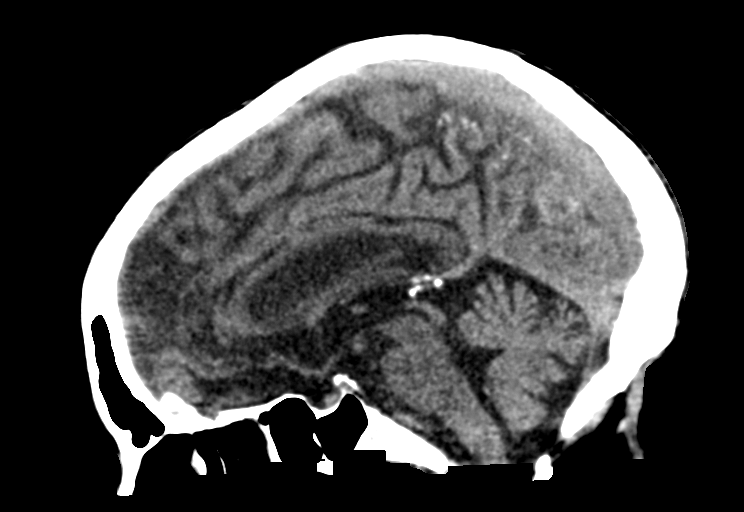
[im 30/51  brain]
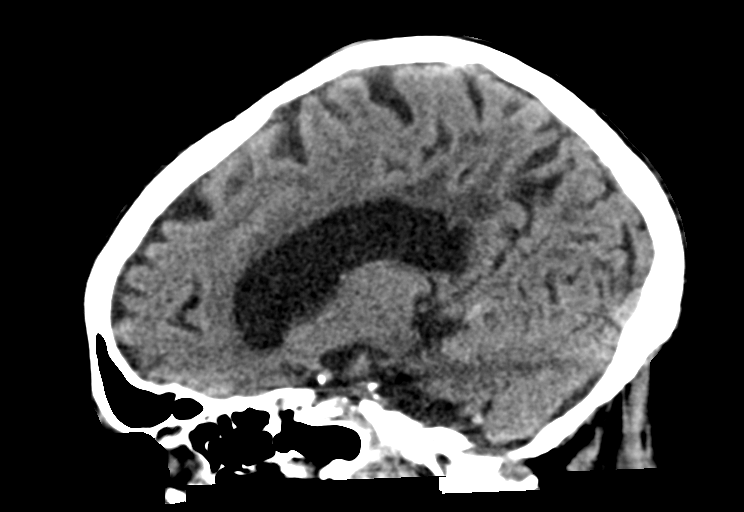

[16 of 47 positions shown; findings below may reference images not displayed]

FINDINGS: Brain: There is no mass, hemorrhage or extra-axial collection. There
is generalized atrophy without lobar predilection. There is an old
right parietal lobe infarct and bilateral old gangliocapsular
lacunar infarcts. There is hypoattenuation of the periventricular
white matter, most commonly indicating chronic ischemic
microangiopathy.

Vascular: Atherosclerotic calcification of the vertebral and
internal carotid arteries at the skull base. No abnormal
hyperdensity of the major intracranial arteries or dural venous
sinuses.

Skull: The visualized skull base, calvarium and extracranial soft
tissues are normal.

Sinuses/Orbits: No fluid levels or advanced mucosal thickening of
the visualized paranasal sinuses. No mastoid or middle ear effusion.
The orbits are normal.

## 2018-11-13 IMAGING — DX DG CHEST 1V PORT
1 series · 1 of 1 positions shown · non-contrast
Comparison: 06/27/2017.

CLINICAL DATA: Syncopal episode.

EXAM:
PORTABLE CHEST 1 VIEW

[chest ap]
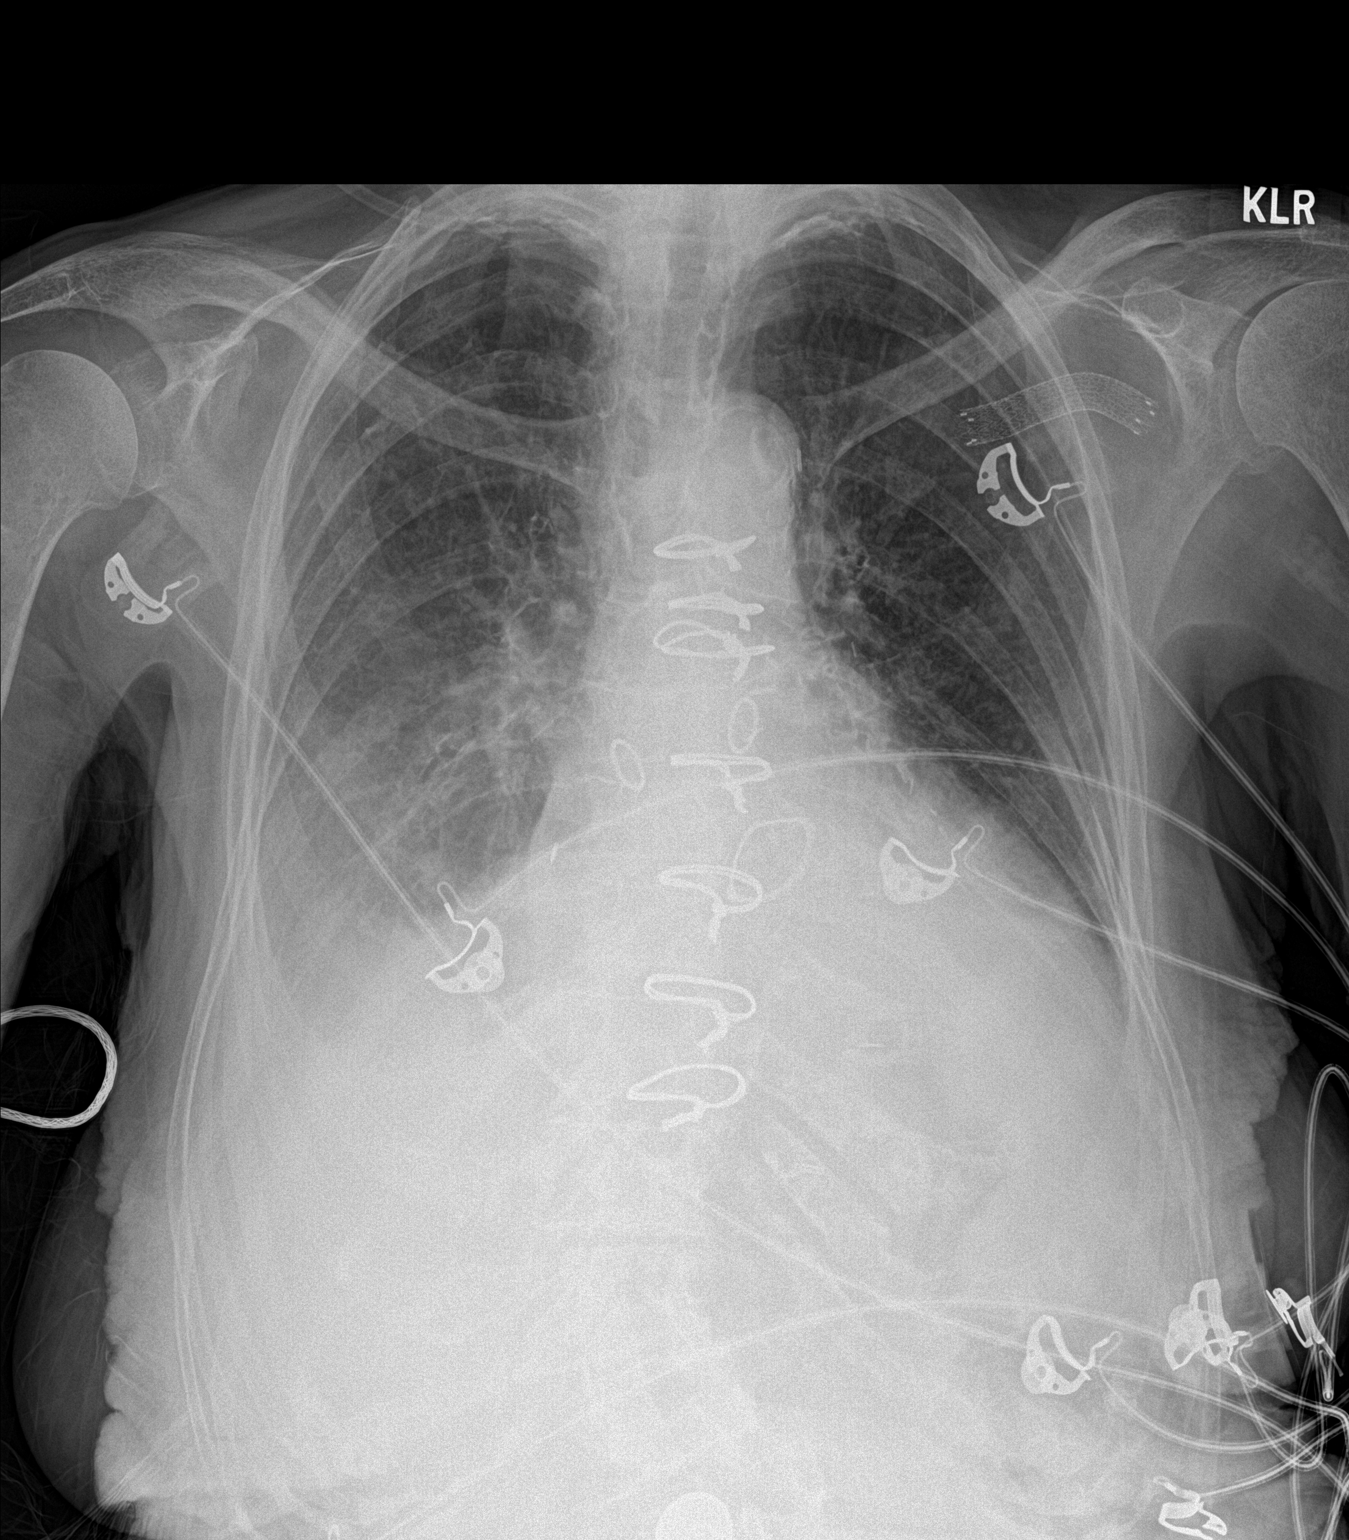

[1 of 1 positions shown; findings below may reference images not displayed]

FINDINGS: Prior CABG and cardiac valve replacement. Cardiomegaly with
pulmonary infiltrates/edema bilateral pleural effusions. Findings
suggest CHF. Bibasilar atelectasis. No pneumothorax. Carotid
vascular calcification. Stable thinning of left subclavian stent.
IMPRESSION: Prior CABG and cardiac valve replacement. Cardiomegaly with changes
of bilateral pulmonary edema bilateral pleural effusions. Findings
consistent with CHF.

2.  Bibasilar atelectasis.

3.  Carotid vascular disease.

## 2018-12-11 IMAGING — DX DG CHEST 1V PORT
1 series · 1 of 1 positions shown · non-contrast
Comparison: 08/06/2017

CLINICAL DATA: Generalized weakness

EXAM:
PORTABLE CHEST 1 VIEW

[chest ap]
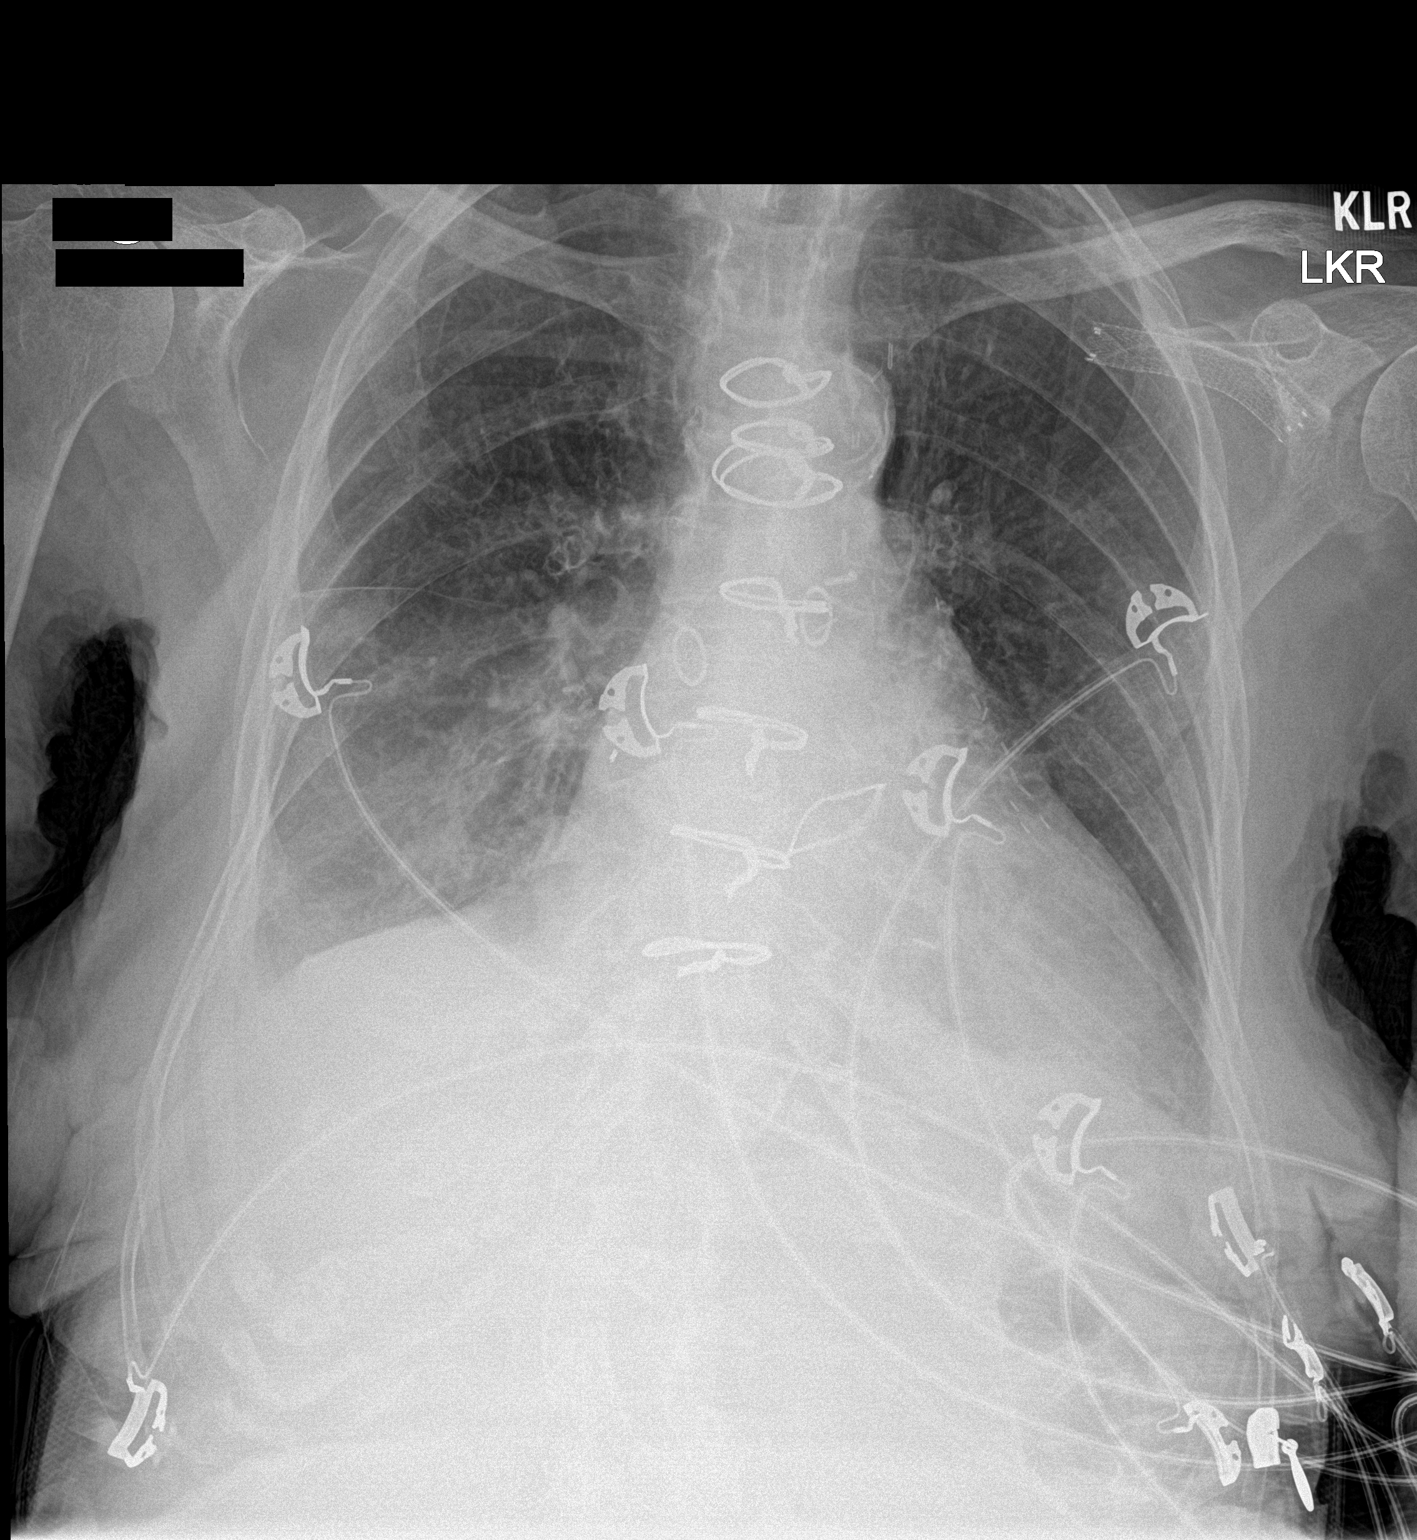

[1 of 1 positions shown; findings below may reference images not displayed]

FINDINGS: CABG sequelae. Unchanged cardiomegaly. Small right pleural effusion
with associated atelectasis, unchanged. No pulmonary edema or focal
airspace consolidation.
IMPRESSION: Unchanged cardiomegaly and small right pleural effusion with basilar
atelectasis.
